# Patient Record
Sex: Male | Born: 1950 | Race: Black or African American | Hispanic: No | Smoking: Former smoker
Health system: Southern US, Community
[De-identification: ages and names within clinical notes are randomized; demographics above are authoritative.]

## PROBLEM LIST (undated history)

## (undated) DIAGNOSIS — I4891 Unspecified atrial fibrillation: Secondary | ICD-10-CM

## (undated) DIAGNOSIS — I428 Other cardiomyopathies: Secondary | ICD-10-CM

## (undated) DIAGNOSIS — R5382 Chronic fatigue, unspecified: Secondary | ICD-10-CM

## (undated) DIAGNOSIS — E785 Hyperlipidemia, unspecified: Secondary | ICD-10-CM

## (undated) DIAGNOSIS — Z9989 Dependence on other enabling machines and devices: Secondary | ICD-10-CM

## (undated) DIAGNOSIS — E669 Obesity, unspecified: Secondary | ICD-10-CM

## (undated) DIAGNOSIS — I10 Essential (primary) hypertension: Secondary | ICD-10-CM

## (undated) DIAGNOSIS — E039 Hypothyroidism, unspecified: Secondary | ICD-10-CM

## (undated) DIAGNOSIS — D649 Anemia, unspecified: Secondary | ICD-10-CM

## (undated) DIAGNOSIS — J45909 Unspecified asthma, uncomplicated: Secondary | ICD-10-CM

## (undated) DIAGNOSIS — M109 Gout, unspecified: Secondary | ICD-10-CM

## (undated) DIAGNOSIS — Z7901 Long term (current) use of anticoagulants: Secondary | ICD-10-CM

## (undated) DIAGNOSIS — M1990 Unspecified osteoarthritis, unspecified site: Secondary | ICD-10-CM

## (undated) DIAGNOSIS — G4733 Obstructive sleep apnea (adult) (pediatric): Secondary | ICD-10-CM

## (undated) DIAGNOSIS — Z720 Tobacco use: Secondary | ICD-10-CM

## (undated) DIAGNOSIS — I5022 Chronic systolic (congestive) heart failure: Secondary | ICD-10-CM

## (undated) DIAGNOSIS — Z95810 Presence of automatic (implantable) cardiac defibrillator: Secondary | ICD-10-CM

## (undated) DIAGNOSIS — J449 Chronic obstructive pulmonary disease, unspecified: Secondary | ICD-10-CM

## (undated) DIAGNOSIS — Z9289 Personal history of other medical treatment: Secondary | ICD-10-CM

## (undated) DIAGNOSIS — C3491 Malignant neoplasm of unspecified part of right bronchus or lung: Principal | ICD-10-CM

## (undated) HISTORY — DX: Unspecified atrial fibrillation: I48.91

## (undated) HISTORY — DX: Other cardiomyopathies: I42.8

## (undated) HISTORY — PX: TONSILLECTOMY: SUR1361

## (undated) HISTORY — DX: Hyperlipidemia, unspecified: E78.5

## (undated) HISTORY — PX: JOINT REPLACEMENT: SHX530

## (undated) HISTORY — DX: Chronic systolic (congestive) heart failure: I50.22

## (undated) HISTORY — DX: Essential (primary) hypertension: I10

## (undated) HISTORY — DX: Malignant neoplasm of unspecified part of right bronchus or lung: C34.91

## (undated) HISTORY — DX: Chronic fatigue, unspecified: R53.82

## (undated) HISTORY — DX: Obesity, unspecified: E66.9

---

## 1997-11-24 ENCOUNTER — Encounter: Payer: Self-pay | Admitting: Orthopedic Surgery

## 1997-11-25 ENCOUNTER — Encounter: Payer: Self-pay | Admitting: Orthopedic Surgery

## 1997-11-25 ENCOUNTER — Inpatient Hospital Stay (HOSPITAL_COMMUNITY): Admission: RE | Admit: 1997-11-25 | Discharge: 1997-11-30 | Payer: Self-pay | Admitting: Orthopedic Surgery

## 1997-11-25 HISTORY — PX: TOTAL HIP ARTHROPLASTY: SHX124

## 2000-09-13 ENCOUNTER — Encounter: Payer: Self-pay | Admitting: Emergency Medicine

## 2000-09-13 ENCOUNTER — Emergency Department (HOSPITAL_COMMUNITY): Admission: EM | Admit: 2000-09-13 | Discharge: 2000-09-13 | Payer: Self-pay | Admitting: Emergency Medicine

## 2004-08-25 ENCOUNTER — Ambulatory Visit: Payer: Self-pay | Admitting: Internal Medicine

## 2005-08-11 ENCOUNTER — Ambulatory Visit: Payer: Self-pay | Admitting: Internal Medicine

## 2006-06-04 ENCOUNTER — Ambulatory Visit: Payer: Self-pay | Admitting: Internal Medicine

## 2006-06-04 LAB — CONVERTED CEMR LAB
ALT: 36 units/L (ref 0–40)
AST: 30 units/L (ref 0–37)
BUN: 15 mg/dL (ref 6–23)
CO2: 30 meq/L (ref 19–32)
Calcium: 9.1 mg/dL (ref 8.4–10.5)
Chloride: 107 meq/L (ref 96–112)
Cholesterol: 118 mg/dL (ref 0–200)
Creatinine, Ser: 0.9 mg/dL (ref 0.4–1.5)
GFR calc Af Amer: 113 mL/min
GFR calc non Af Amer: 93 mL/min
Glucose, Bld: 101 mg/dL — ABNORMAL HIGH (ref 70–99)
HDL: 29.5 mg/dL — ABNORMAL LOW (ref >39.0)
LDL Cholesterol: 71 mg/dL (ref 0–99)
Potassium: 3.2 meq/L — ABNORMAL LOW (ref 3.5–5.1)
Sodium: 143 meq/L (ref 135–145)
TSH: 2.87 microintl units/mL (ref 0.35–5.50)
Total CHOL/HDL Ratio: 4
Triglycerides: 90 mg/dL (ref 0–149)
VLDL: 18 mg/dL (ref 0–40)

## 2006-06-11 ENCOUNTER — Ambulatory Visit: Payer: Self-pay | Admitting: Internal Medicine

## 2006-07-16 ENCOUNTER — Ambulatory Visit: Payer: Self-pay | Admitting: Internal Medicine

## 2006-07-16 LAB — CONVERTED CEMR LAB
BUN: 10 mg/dL (ref 6–23)
CO2: 29 meq/L (ref 19–32)
Calcium: 9.6 mg/dL (ref 8.4–10.5)
Chloride: 108 meq/L (ref 96–112)
Creatinine, Ser: 1 mg/dL (ref 0.4–1.5)
GFR calc Af Amer: 100 mL/min
GFR calc non Af Amer: 83 mL/min
Glucose, Bld: 92 mg/dL (ref 70–99)
Potassium: 4.1 meq/L (ref 3.5–5.1)
Sodium: 142 meq/L (ref 135–145)

## 2006-07-17 ENCOUNTER — Ambulatory Visit (HOSPITAL_COMMUNITY): Admission: RE | Admit: 2006-07-17 | Discharge: 2006-07-17 | Payer: Self-pay | Admitting: Internal Medicine

## 2006-08-30 ENCOUNTER — Ambulatory Visit: Payer: Self-pay | Admitting: Internal Medicine

## 2006-11-02 ENCOUNTER — Ambulatory Visit: Payer: Self-pay | Admitting: Internal Medicine

## 2007-01-31 ENCOUNTER — Ambulatory Visit: Payer: Self-pay | Admitting: Internal Medicine

## 2007-06-14 ENCOUNTER — Ambulatory Visit: Payer: Self-pay | Admitting: Internal Medicine

## 2007-09-11 ENCOUNTER — Ambulatory Visit: Payer: Self-pay | Admitting: Internal Medicine

## 2007-09-11 LAB — CONVERTED CEMR LAB
AST: 22 units/L (ref 0–37)
BUN: 19 mg/dL (ref 6–23)
CO2: 28 meq/L (ref 19–32)
Calcium: 9.1 mg/dL (ref 8.4–10.5)
Chloride: 103 meq/L (ref 96–112)
Cholesterol: 139 mg/dL (ref 0–200)
Creatinine, Ser: 1 mg/dL (ref 0.4–1.5)
GFR calc Af Amer: 100 mL/min
GFR calc non Af Amer: 82 mL/min
Glucose, Bld: 104 mg/dL — ABNORMAL HIGH (ref 70–99)
HDL: 28.4 mg/dL — ABNORMAL LOW (ref >39.0)
LDL Cholesterol: 91 mg/dL (ref 0–99)
Potassium: 3.3 meq/L — ABNORMAL LOW (ref 3.5–5.1)
Sodium: 140 meq/L (ref 135–145)
Total CHOL/HDL Ratio: 4.9
Triglycerides: 98 mg/dL (ref 0–149)
VLDL: 20 mg/dL (ref 0–40)

## 2008-01-31 ENCOUNTER — Ambulatory Visit: Payer: Self-pay | Admitting: Internal Medicine

## 2008-01-31 LAB — CONVERTED CEMR LAB
BUN: 18 mg/dL (ref 6–23)
CO2: 30 meq/L (ref 19–32)
Chloride: 107 meq/L (ref 96–112)
Creatinine, Ser: 1.2 mg/dL (ref 0.4–1.5)
Glucose, Bld: 109 mg/dL — ABNORMAL HIGH (ref 70–99)

## 2008-02-18 ENCOUNTER — Ambulatory Visit: Payer: Self-pay | Admitting: Internal Medicine

## 2008-02-18 LAB — CONVERTED CEMR LAB
BUN: 13 mg/dL (ref 6–23)
CO2: 29 meq/L (ref 19–32)
Chloride: 107 meq/L (ref 96–112)
Glucose, Bld: 105 mg/dL — ABNORMAL HIGH (ref 70–99)
Potassium: 3.4 meq/L — ABNORMAL LOW (ref 3.5–5.1)
Sodium: 142 meq/L (ref 135–145)

## 2008-03-02 ENCOUNTER — Ambulatory Visit: Payer: Self-pay | Admitting: Internal Medicine

## 2008-03-02 LAB — CONVERTED CEMR LAB
BUN: 20 mg/dL (ref 6–23)
Chloride: 107 meq/L (ref 96–112)
Glucose, Bld: 102 mg/dL — ABNORMAL HIGH (ref 70–99)
Potassium: 3.7 meq/L (ref 3.5–5.1)
Sodium: 142 meq/L (ref 135–145)

## 2008-03-19 ENCOUNTER — Ambulatory Visit: Payer: Self-pay | Admitting: Internal Medicine

## 2008-03-19 LAB — CONVERTED CEMR LAB
BUN: 20 mg/dL (ref 6–23)
CO2: 28 meq/L (ref 19–32)
Chloride: 108 meq/L (ref 96–112)
Creatinine, Ser: 1.3 mg/dL (ref 0.4–1.5)
GFR calc non Af Amer: 61 mL/min
Potassium: 4 meq/L (ref 3.5–5.1)

## 2009-01-29 ENCOUNTER — Encounter: Payer: Self-pay | Admitting: Internal Medicine

## 2009-04-03 HISTORY — PX: CARDIOVERSION: SHX1299

## 2009-07-06 ENCOUNTER — Telehealth: Payer: Self-pay | Admitting: Internal Medicine

## 2009-07-15 ENCOUNTER — Ambulatory Visit: Payer: Self-pay | Admitting: Internal Medicine

## 2009-07-15 DIAGNOSIS — E7800 Pure hypercholesterolemia, unspecified: Secondary | ICD-10-CM | POA: Insufficient documentation

## 2009-07-15 DIAGNOSIS — E785 Hyperlipidemia, unspecified: Secondary | ICD-10-CM

## 2009-07-15 DIAGNOSIS — E663 Overweight: Secondary | ICD-10-CM | POA: Insufficient documentation

## 2009-07-15 DIAGNOSIS — I10 Essential (primary) hypertension: Secondary | ICD-10-CM | POA: Insufficient documentation

## 2009-07-15 DIAGNOSIS — F17200 Nicotine dependence, unspecified, uncomplicated: Secondary | ICD-10-CM | POA: Insufficient documentation

## 2009-07-26 ENCOUNTER — Ambulatory Visit: Payer: Self-pay | Admitting: Internal Medicine

## 2009-07-26 DIAGNOSIS — E876 Hypokalemia: Secondary | ICD-10-CM | POA: Insufficient documentation

## 2009-07-27 ENCOUNTER — Encounter: Payer: Self-pay | Admitting: Internal Medicine

## 2009-07-27 LAB — CONVERTED CEMR LAB
BUN: 16 mg/dL (ref 6–23)
Calcium: 9 mg/dL (ref 8.4–10.5)
Chloride: 104 meq/L (ref 96–112)
Creatinine, Ser: 1.3 mg/dL (ref 0.4–1.5)
GFR calc non Af Amer: 72.98 mL/min (ref >60)

## 2009-07-29 ENCOUNTER — Ambulatory Visit: Payer: Self-pay | Admitting: Internal Medicine

## 2009-07-30 ENCOUNTER — Encounter: Payer: Self-pay | Admitting: Internal Medicine

## 2009-07-30 LAB — CONVERTED CEMR LAB
Calcium: 9.5 mg/dL (ref 8.4–10.5)
GFR calc non Af Amer: 88.49 mL/min (ref >60)
Glucose, Bld: 99 mg/dL (ref 70–99)
Potassium: 4.5 meq/L (ref 3.5–5.1)
Sodium: 143 meq/L (ref 135–145)

## 2009-08-04 ENCOUNTER — Ambulatory Visit: Payer: Self-pay | Admitting: Internal Medicine

## 2009-08-04 DIAGNOSIS — E875 Hyperkalemia: Secondary | ICD-10-CM

## 2009-08-04 LAB — CONVERTED CEMR LAB
Aldosterone, Serum: 8
BUN: 14 mg/dL (ref 6–23)
Chloride: 103 meq/L (ref 96–112)
GFR calc non Af Amer: 88.49 mL/min (ref >60)
Potassium: 3.2 meq/L — ABNORMAL LOW (ref 3.5–5.1)
Sodium: 143 meq/L (ref 135–145)

## 2009-08-17 ENCOUNTER — Ambulatory Visit: Payer: Self-pay | Admitting: Internal Medicine

## 2009-08-20 LAB — CONVERTED CEMR LAB
CO2: 33 meq/L — ABNORMAL HIGH (ref 19–32)
Calcium: 9.3 mg/dL (ref 8.4–10.5)
Chloride: 103 meq/L (ref 96–112)
Glucose, Bld: 118 mg/dL — ABNORMAL HIGH (ref 70–99)
Potassium: 3.9 meq/L (ref 3.5–5.1)
Sodium: 142 meq/L (ref 135–145)

## 2009-09-22 ENCOUNTER — Encounter: Payer: Self-pay | Admitting: Internal Medicine

## 2009-09-24 ENCOUNTER — Ambulatory Visit: Payer: Self-pay | Admitting: Internal Medicine

## 2009-09-24 DIAGNOSIS — I4891 Unspecified atrial fibrillation: Secondary | ICD-10-CM | POA: Insufficient documentation

## 2009-09-27 ENCOUNTER — Ambulatory Visit: Payer: Self-pay | Admitting: Internal Medicine

## 2009-09-27 ENCOUNTER — Encounter (INDEPENDENT_AMBULATORY_CARE_PROVIDER_SITE_OTHER): Payer: Self-pay | Admitting: *Deleted

## 2009-09-27 ENCOUNTER — Ambulatory Visit: Payer: Self-pay | Admitting: Cardiology

## 2009-09-27 DIAGNOSIS — G4730 Sleep apnea, unspecified: Secondary | ICD-10-CM | POA: Insufficient documentation

## 2009-09-29 ENCOUNTER — Encounter: Payer: Self-pay | Admitting: Cardiology

## 2009-09-29 ENCOUNTER — Ambulatory Visit: Payer: Self-pay | Admitting: Cardiology

## 2009-09-29 ENCOUNTER — Ambulatory Visit (HOSPITAL_COMMUNITY): Admission: RE | Admit: 2009-09-29 | Discharge: 2009-09-29 | Payer: Self-pay | Admitting: Internal Medicine

## 2009-10-05 ENCOUNTER — Ambulatory Visit: Payer: Self-pay | Admitting: Internal Medicine

## 2009-10-07 ENCOUNTER — Encounter: Payer: Self-pay | Admitting: Internal Medicine

## 2009-10-07 ENCOUNTER — Ambulatory Visit: Payer: Self-pay | Admitting: Internal Medicine

## 2009-10-14 ENCOUNTER — Encounter: Payer: Self-pay | Admitting: Internal Medicine

## 2009-10-14 ENCOUNTER — Ambulatory Visit: Payer: Self-pay | Admitting: Internal Medicine

## 2009-10-21 ENCOUNTER — Telehealth: Payer: Self-pay | Admitting: Internal Medicine

## 2009-10-25 ENCOUNTER — Ambulatory Visit: Payer: Self-pay | Admitting: Cardiology

## 2009-11-08 ENCOUNTER — Ambulatory Visit: Payer: Self-pay | Admitting: Cardiovascular Disease

## 2009-11-08 LAB — CONVERTED CEMR LAB: POC INR: 2.7

## 2009-11-22 ENCOUNTER — Ambulatory Visit (HOSPITAL_BASED_OUTPATIENT_CLINIC_OR_DEPARTMENT_OTHER): Admission: RE | Admit: 2009-11-22 | Discharge: 2009-11-22 | Payer: Self-pay | Admitting: Internal Medicine

## 2009-11-22 ENCOUNTER — Encounter: Payer: Self-pay | Admitting: Pulmonary Disease

## 2009-11-22 ENCOUNTER — Encounter: Payer: Self-pay | Admitting: Internal Medicine

## 2009-12-01 ENCOUNTER — Ambulatory Visit: Payer: Self-pay | Admitting: Pulmonary Disease

## 2009-12-03 ENCOUNTER — Ambulatory Visit: Payer: Self-pay | Admitting: Internal Medicine

## 2009-12-07 ENCOUNTER — Ambulatory Visit: Payer: Self-pay | Admitting: Cardiovascular Disease

## 2009-12-07 LAB — CONVERTED CEMR LAB: POC INR: 2.3

## 2009-12-20 ENCOUNTER — Ambulatory Visit: Payer: Self-pay | Admitting: Pulmonary Disease

## 2009-12-20 DIAGNOSIS — G4733 Obstructive sleep apnea (adult) (pediatric): Secondary | ICD-10-CM | POA: Insufficient documentation

## 2010-01-03 ENCOUNTER — Ambulatory Visit: Payer: Self-pay | Admitting: Internal Medicine

## 2010-01-03 LAB — CONVERTED CEMR LAB: POC INR: 1.2

## 2010-01-13 ENCOUNTER — Ambulatory Visit: Payer: Self-pay | Admitting: Internal Medicine

## 2010-01-13 LAB — CONVERTED CEMR LAB: POC INR: 2

## 2010-01-27 ENCOUNTER — Ambulatory Visit: Payer: Self-pay | Admitting: Internal Medicine

## 2010-01-27 LAB — CONVERTED CEMR LAB: POC INR: 2.4

## 2010-02-17 ENCOUNTER — Ambulatory Visit: Payer: Self-pay | Admitting: Cardiovascular Disease

## 2010-03-02 ENCOUNTER — Encounter (INDEPENDENT_AMBULATORY_CARE_PROVIDER_SITE_OTHER): Payer: Self-pay | Admitting: *Deleted

## 2010-03-10 ENCOUNTER — Encounter (INDEPENDENT_AMBULATORY_CARE_PROVIDER_SITE_OTHER): Payer: Self-pay | Admitting: *Deleted

## 2010-03-17 ENCOUNTER — Ambulatory Visit: Payer: Self-pay | Admitting: Internal Medicine

## 2010-04-01 ENCOUNTER — Ambulatory Visit: Payer: Self-pay | Admitting: Internal Medicine

## 2010-04-01 ENCOUNTER — Encounter: Payer: Self-pay | Admitting: Internal Medicine

## 2010-04-11 ENCOUNTER — Telehealth: Payer: Self-pay | Admitting: Internal Medicine

## 2010-04-11 LAB — CONVERTED CEMR LAB
BUN: 17 mg/dL (ref 6–23)
Basophils Absolute: 0 10*3/uL (ref 0.0–0.1)
Basophils Relative: 0 % (ref 0–1)
Chloride: 105 meq/L (ref 96–112)
Cholesterol: 140 mg/dL (ref 0–200)
Eosinophils Absolute: 0.2 10*3/uL (ref 0.0–0.7)
HDL: 31 mg/dL — ABNORMAL LOW (ref >39)
LDL Cholesterol: 91 mg/dL (ref 0–99)
MCHC: 33.3 g/dL (ref 30.0–36.0)
MCV: 92.3 fL (ref 78.0–100.0)
Neutro Abs: 2.7 10*3/uL (ref 1.7–7.7)
Neutrophils Relative %: 53 % (ref 43–77)
Platelets: 180 10*3/uL (ref 150–400)
Potassium: 4.3 meq/L (ref 3.5–5.3)
RBC: 4.68 M/uL (ref 4.22–5.81)
RDW: 15 % (ref 11.5–15.5)
Sodium: 141 meq/L (ref 135–145)
Total CHOL/HDL Ratio: 4.5
Triglycerides: 89 mg/dL (ref <150)
VLDL: 18 mg/dL (ref 0–40)

## 2010-04-14 ENCOUNTER — Ambulatory Visit: Admission: RE | Admit: 2010-04-14 | Discharge: 2010-04-14 | Payer: Self-pay | Source: Home / Self Care

## 2010-04-14 LAB — CONVERTED CEMR LAB: POC INR: 2.4

## 2010-04-24 ENCOUNTER — Encounter: Payer: Self-pay | Admitting: Internal Medicine

## 2010-05-01 LAB — CONVERTED CEMR LAB
BUN: 13 mg/dL (ref 6–23)
Basophils Relative: 0 % (ref 0–1)
CO2: 24 meq/L (ref 19–32)
CO2: 27 meq/L (ref 19–32)
Calcium: 9.3 mg/dL (ref 8.4–10.5)
Calcium: 9.6 mg/dL (ref 8.4–10.5)
Chloride: 104 meq/L (ref 96–112)
Cholesterol: 112 mg/dL (ref 0–200)
Cholesterol: 142 mg/dL (ref 0–200)
Eosinophils Relative: 1 % (ref 0–5)
Glucose, Bld: 113 mg/dL — ABNORMAL HIGH (ref 70–99)
Glucose, Bld: 83 mg/dL (ref 70–99)
HCT: 44.1 % (ref 39.0–52.0)
Hemoglobin: 15 g/dL (ref 13.0–17.0)
LDL Cholesterol: 61 mg/dL (ref 0–99)
LDL Cholesterol: 93 mg/dL (ref 0–99)
MCHC: 34 g/dL (ref 30.0–36.0)
Monocytes Absolute: 0.7 10*3/uL (ref 0.1–1.0)
Monocytes Relative: 11 % (ref 3–12)
Neutro Abs: 3.9 10*3/uL (ref 1.7–7.7)
Potassium: 3.9 meq/L (ref 3.5–5.3)
RBC: 5.03 M/uL (ref 4.22–5.81)
RDW: 14.6 % (ref 11.5–15.5)
Sodium: 140 meq/L (ref 135–145)
Sodium: 140 meq/L (ref 135–145)
Total CHOL/HDL Ratio: 4
Total CHOL/HDL Ratio: 4
Triglycerides: 103 mg/dL (ref 0.0–149.0)
Triglycerides: 81 mg/dL (ref 0.0–149.0)
VLDL: 16.2 mg/dL (ref 0.0–40.0)
VLDL: 20.6 mg/dL (ref 0.0–40.0)

## 2010-05-03 NOTE — Assessment & Plan Note (Signed)
Summary: consult for osa   Visit Type:  Initial Consult Copy to:  Dr. Tenny Craw Primary Provider/Referring Provider:  Dr Ivory Broad  CC:  Sleep Consult. [name]  History of Present Illness: The pt is a 60y/o male who I have been asked to see for management of osa.  He has had a recent sleep study that was a split night protocol, and revealed moderate osa with AHI of 33/hr.  He was then placed on cpap, and titrated to optimal pressure of 17cm.  The pt has been noted to have loud snoring and an abnormal breathing pattern during sleep.  He works third shift from 8pm to as late as 6:30am.  He typically sleeps from 7am to 2 pm, and doesn't feel rested upon arising.  He describes frequent awakenings and choking arousals from sleep.  He notes significant sleep pressure with periods of inactivity, although does not get sleepy driving.  His epworth score today is very high at 17.  His weight is down 10 pounds.  Current Medications (verified): 1)  Clonidine Hcl 0.3 Mg Tabs (Clonidine Hcl) .[name].. 1tab Two Times A Day 2)  Hydralazine Hcl 25 Mg Tabs (Hydralazine Hcl) .... Take One Tab Three Times Daily 3)  Lisinopril 40 Mg Tabs (Lisinopril) .... One Tab Once Daily 4)  Simvastatin 40 Mg Tabs (Simvastatin) .[name].. 1 Tablet By Mouth At Bedtime 5)  Allopurinol 300 Mg Tabs (Allopurinol) .[name].. 1 Tab Two Times A Day \\par  6)  Niaspan 1000 Mg Cr-Tabs (Niacin (Antihyperlipidemic)) .[name].. 1 Tab At Bedtime 7)  Klor-Con 20 Meq Pack (Potassium Chloride) .[name].. 1 Tab Bid 8)  Furosemide 40 Mg Tabs (Furosemide) .... Take One Tablet Two Times A Day 9)  Warfarin Sodium 5 Mg Tabs (Warfarin Sodium) .... Use As Directed By Anticoagulation Clinic 10)  Metoprolol Tartrate 50 Mg Tabs (Metoprolol Tartrate) .... 3 Tabs 2 Times Per Day  Allergies (verified): No Known Drug Allergies  Past History:  Past Medical History: Hypertension Dyslipidemia Obesity Atrial fibrillation. OSA--AHI 33/hr 11/2009  Past Surgical History: Reviewed history from  02/02/2008 and no changes required. Hip replacement 1999  Family History: Reviewed history from 02/02/2008 and no changes required. Dad - deceased (unknown cause) Mom - Hypertension  Social History: Reviewed history from 02/02/2008 and no changes required. Married lives with wife Children--1 Tobacco use ( 1/2ppd). Started 1969.  Review of Systems       The patient complains of irregular heartbeats.  The patient denies shortness of breath with activity, shortness of breath at rest, productive cough, non-productive cough, coughing up blood, chest pain, acid heartburn, indigestion, loss of appetite, weight change, abdominal pain, difficulty swallowing, sore throat, tooth/dental problems, headaches, nasal congestion/difficulty breathing through nose, sneezing, itching, ear ache, anxiety, depression, hand/feet swelling, joint stiffness or pain, rash, change in color of mucus, and fever.    Vital Signs:  Patient profile:   60 year old male Height:      73 inches Weight:      296.38 pounds BMI:     39.24 O2 Sat:      97 % on Room air Temp:     98.2 degrees F oral Pulse rate:   75 / minute BP sitting:   152 / 102  (right arm) Cuff size:   large  Vitals Entered By: Carver Fila (December 20, 2009 10:49 AM)  O2 Flow:  Room air CC: Sleep Consult.  Comments meds and allergies updated Phone number updated Carver Fila  December 20, 2009 10:52 AM  Physical Exam  General:  obese male in nad Eyes:  PERRLA and EOMI.   Nose:  large turbinates with narrowing bilat. Mouth:  elongation of soft palate and normal uvula. small posterior pharyngeal space Neck:  large, no jvd, tmg, LN Lungs:  clear to auscultation Heart:  rrr, 2/6 sem. Abdomen:  soft and nontender,bs+ Extremities:  1+ edema bilat, pulses intact distally no cyanosis noted. Neurologic:  alert and oriented, moves all 4.   Impression & Recommendations:  Problem # 1:  OBSTRUCTIVE SLEEP APNEA (ICD-327.23) the pt has  moderate osa by his recent sleep study, with an excellent response to cpap during the therapeutic portion of the study.  I have had a long discussion with the pt about sleep apnea, including its impact on QOL and CV health.  Although there are other potential treatment options available to the pt, I have recommended a trial of cpap while working on weight loss.  He has comorbid medical issues that can be greatly affected by sleep disordered breathing, and is clearly very symptomatic during his waking hours. (although his sleep schedule may be contributing to this as well).  The pt is agreeable to trying cpap, and will set up on moderate pressure initially to allow for desensitization.  Other Orders: Consultation Level IV (47829) DME Referral (DME)  Patient Instructions: 1)  will start on cpap, please call if having tolerance issues 2)  work on weight loss 3)  followup with me in 4-5 weeks.

## 2010-05-03 NOTE — Assessment & Plan Note (Signed)
Visit Type:  Follow-up Primary Provider:  Dr Ivory Broad  CC:  no complaints.  History of Present Illness: Patient is a 60 year old with a history of dyslipidemia, hypertension, obesity and tobacco use.  I saw him in Oct 2009.  Since seen he has done well.  No chest pain.  No SOB.  Still smoking.  Had a rxn to [facility].  Current Medications (verified): 1)  Amlodipine Besylate 10 Mg Tabs (Amlodipine Besylate) .[name].. 1 Tab Once Daily 2)  Clonidine Hcl 0.3 Mg Tabs (Clonidine Hcl) .[name].. 1tab Two Times A Day 3)  Hydralazine Hcl 25 Mg Tabs (Hydralazine Hcl) .... Take One Tab Three Times Daily 4)  Lisinopril 40 Mg Tabs (Lisinopril) .... One Tab Once Daily 5)  Metoprolol Tartrate 50 Mg Tabs (Metoprolol Tartrate) .... 3 Tab Two Times A Day 6)  Niaspan 500 Mg Cr-Tabs (Niacin (Antihyperlipidemic)) .... Take 1 Tablet By Mouth At Bedtime 7)  Simvastatin 40 Mg Tabs (Simvastatin) .[name].. 1 Tablet By Mouth At Bedtime 8)  Triamterene-Hctz 37.5-25 Mg Caps (Triamterene-Hctz) .[name].. 1 Capsule By Mouth Every Morning 9)  Allopurinol 300 Mg Tabs (Allopurinol) .[name].. 1 Tab Two Times A Day \\par   Allergies (verified): No Known Drug Allergies  Past History:  Past medical, surgical, family and social histories (including risk factors) reviewed, and no changes noted (except as noted below).  Past Medical History: Reviewed history from 02/02/2008 and no changes required. Hypertension Dyslipidemia Obesity  Past Surgical History: Reviewed history from 02/02/2008 and no changes required. Hip replacement 1999  Family History: Reviewed history from 02/02/2008 and no changes required. Dad - deceased (unknown cause) Mom - Hypertension 2 brothers  Social History: Reviewed history from 02/02/2008 and no changes required. Married Tobacco use (1ppd to 1/2ppd)  Review of Systems       All systems reviewed.  Neg to above problem.   Vital Signs:  Patient profile:   60 year old male Height:      73 inches Weight:       305 pounds BMI:     40.39 Pulse rate:   65 / minute BP sitting:   126 / 73  (left arm) Cuff size:   large  Vitals Entered By: Burnett Kanaris, CNA (July 15, 2009 9:27 AM)  Physical Exam  Additional Exam:  Patient is in NAD HEENT:  Normocephalic, atraumatic. EOMI, PERRLA.  Neck: JVP is normal. No thyromegaly. No bruits.  Lungs: clear to auscultation. No rales no wheezes.  Chest: Gynecomastia Heart: Regular rate and rhythm. Normal S1, S2. No S3.   No significant murmurs. PMI not displaced.  Abdomen:  Supple, nontender. Normal bowel sounds. No masses. No hepatomegaly.  Extremities:   Good distal pulses throughout. No lower extremity edema.  Musculoskeletal :moving all extremities.  Neuro:   alert and oriented x3.    EKG  Procedure date:  07/15/2009  Findings:      NSR>  63 bpm  Impression & Recommendations:  Problem # 1:  HYPERLIPIDEMIA-MIXED (ICD-272.4) Will check lipid and AST today. The following medications were removed from the medication list:    Lescol 20 Mg Caps (Fluvastatin sodium)    Niaspan 500 Mg Cr-tabs (Niacin (antihyperlipidemic)) .[name]... Take 1 tablet by mouth at bedtime    Vytorin 10-10 Mg Tabs (Ezetimibe-simvastatin) His updated medication list for this problem includes:    Simvastatin 40 Mg Tabs (Simvastatin) .[name]... 1 tablet by mouth at bedtime    Niaspan 1000 Mg Cr-tabs (Niacin (antihyperlipidemic)) .[name]... 1 tab at bedtime  Problem #  2:  HYPERTENSION, BENIGN (ICD-401.1) Good control.  Check BMET today.  Problem # 3:  TOBACCO ABUSE (ICD-305.1) Counselled on quitting.  Problem # 4:  OVERWEIGHT/OBESITY (ICD-278.02) Counselled.  Other Orders: EKG w/ Interpretation (93000) TLB-BMP (Basic Metabolic Panel-BMET) (80048-METABOL) TLB-AST (SGOT) (84450-SGOT) TLB-Lipid Panel (80061-LIPID) Prescriptions: POTASSIUM CHLORIDE CRYS CR 20 MEQ CR-TABS (POTASSIUM CHLORIDE CRYS CR) Take one tablet by mouth daily  #30 x 6   Entered by:   Layne Benton, RN, BSN    Authorized by:   Sherrill Raring, MD, [facility]   Signed by:   Layne Benton, RN, BSN on 07/16/2009   Method used:   Electronically to        [facility] [phone]* (retail)       [address]       [place], [place]  [address]       Ph: [phone]       Fax: [phone]   RxID:   [id number] NIASPAN 1000 MG CR-TABS (NIACIN (ANTIHYPERLIPIDEMIC)) 1 tab at bedtime  #30 x 6   Entered by:   Layne Benton, RN, BSN   Authorized by:   Sherrill Raring, MD, [facility]   Signed by:   Layne Benton, RN, BSN on 07/16/2009   Method used:   Electronically to        [facility] [phone]* (retail)       [address]       [place], [place]  [address]       Ph: [phone]       Fax: [phone]   RxID:   [id number]

## 2010-05-03 NOTE — Progress Notes (Signed)
Summary: refill /lisinopril and simvastation done daj  Phone Note Refill Request Message from:  Patient on July 06, 2009 2:06 PM  Refills Requested: Medication #1:  LISINOPRIL 40 MG TABS one tab once daily  Medication #2:  SIMVASTATIN 40 MG TABS 1 tablet by mouth at bedtime [facility] to [facility] [phone] need 90 day supply do not send to [facility].  Initial call taken by: Judie Grieve,  July 06, 2009 2:08 PM  Follow-up for Phone Call        Pt needs these two called into [facility] -he uses different pharmacies for different medicationsDAJ Follow-up by: Burnett Kanaris, CNA,  July 06, 2009 2:44 PM  Additional Follow-up for Phone Call Additional follow up Details #1::        Spoke with [facility] [address] [facility] is closed. Pt will use [address] [facility]. Refills sent -pt has appt coming up 04.14.2011 Additional Follow-up by: Burnett Kanaris, CNA,  July 06, 2009 2:46 PM    Prescriptions: SIMVASTATIN 40 MG TABS (SIMVASTATIN) 1 tablet by mouth at bedtime  #90 x 1   Entered by:   Burnett Kanaris, CNA   Authorized by:   Sherrill Raring, MD, [facility]   Signed by:   Burnett Kanaris, CNA on 07/06/2009   Method used:   [facility]        [facility] [phone]* (retail)       [address] [place], [place]  [address]       Ph: [phone]       Fax: [phone]   RxID:   [id number] LISINOPRIL 40 MG TABS (LISINOPRIL) one tab once daily  #90 x 1   Entered by:   Burnett Kanaris, CNA   Authorized by:   Sherrill Raring, MD, [facility]   Signed by:   Burnett Kanaris, CNA on 07/06/2009   Method used:   [facility] [phone]* (retail)       [address] [place], [place]  [address]       Ph: [phone]       Fax: [phone]   RxID:   [id number]

## 2010-05-03 NOTE — Letter (Signed)
Summary: Cardioversion/TEE Instructions  Architectural technologist, Main Office  [address]   [place], [place] [address]   Phone: [phone]  Fax: [phone]    Cardioversion / TEE Cardioversion Instructions  You are scheduled for a Cardioversion / TEE Cardioversion on ___6/29/2011_________________________ with Dr. _________________________________________.   Please arrive at the [facility] at ____9_________ a.m. / . on the day of your procedure.  1)   DIET:  A)   Nothing to eat or drink after midnight except your medications with a sip of water.            3)   MAKE SURE YOU TAKE YOUR COUMADIN.  4)   A)   DO NOT TAKE these medications before your procedure:      ___________________________________________________________________     ___________________________________________________________________     ___________________________________________________________________  B)   YOU MAY TAKE ALL of your remaining medications with a small amount of water.    C)   START NEW medications:       ___________________________________________________________________     ___________________________________________________________________  5)  Must have a responsible person to drive you home.  6)   Bring a current list of your medications and current insurance cards.   * Special Note:  Every effort is made to have your procedure done on time. Occasionally there are emergencies that present themselves at the hospital that may cause delays. Please be patient if a delay does occur.  * If you have any questions after you get home, please call the office at [phone].

## 2010-05-03 NOTE — Medication Information (Signed)
Summary: rov/jk  Anticoagulant Therapy  Managed by: Cloyde Reams, RN, BSN Referring MD: Tenny Craw PCP: Dr Larna Daughters MD: Eden Emms MD, Theron Arista Indication 1: Atrial Fibrillation Lab Used: [facility] [place] Site: [address] INR POC 2.3 INR RANGE 2.0-3.0  Dietary changes: no    Health status changes: no    Bleeding/hemorrhagic complications: no    Recent/future hospitalizations: no    Any changes in medication regimen? no    Recent/future dental: no  Any missed doses?: no       Is patient compliant with meds? yes       Allergies: No Known Drug Allergies  Anticoagulation Management History:      The patient is taking warfarin and comes in today for a routine follow up visit.  Negative risk factors for bleeding include an age less than 53 years old.  The bleeding index is 'low risk'.  Positive CHADS2 values include History of HTN.  Negative CHADS2 values include Age > 81 years old.  Anticoagulation responsible provider: Eden Emms MD, Theron Arista.  INR POC: 2.3.  Cuvette Lot#: [id number].  Exp: 01/2011.    Anticoagulation Management Assessment/Plan:      The patient's current anticoagulation dose is Warfarin sodium 5 mg tabs: Use as directed by Anticoagulation Clinic.  The target INR is 2.0-3.0.  The next INR is due 01/03/2010.  Anticoagulation instructions were given to patient.  Results were reviewed/authorized by Cloyde Reams, RN, BSN.  He was notified by Cloyde Reams RN.         Prior Anticoagulation Instructions: INR- 2.7  Continue taking 1.5 tablets (7.5mg ) every day except take 1 tablet (5mg ) on Tues, Thurs, and Sat.    Current Anticoagulation Instructions: INR 2.3    Continue on same dosage 1.5 tablets daily except 1 tablet on Tuesdays, Thursdays, and Saturdays.  Recheck in 4 weeks.

## 2010-05-03 NOTE — Miscellaneous (Signed)
Clinical Lists Changes  Problems: Added new problem of SLEEP APNEA (ICD-780.57) Orders: Added new Service order of EKG w/ Interpretation (93000) - Signed Added new Referral order of Trans Esophageal Echo Cardioversion (TEE-Cardioversion) - Signed Added new Referral order of Sleep Disorder Referral (Sleep Disorder) - Signed Added new Test order of TLB-Lipid Panel (80061-LIPID) - Signed Added new Test order of TLB-AST (SGOT) (84450-SGOT) - Signed Observations: Added new observation of PI CARDIO: Your physician recommends that you return for lab work in: lab work today....we will call you with results. Your physician has requested that you have a TEE/Cardioversion.  During a TEE, sound waves are used to create images of your heart. It provides your doctor with information about the size and shape of your heart and how well your heart's chambers and valves are working. In this test, a transducer is attached to the end of a flexible tube that is guided down your throat and into your esophagus (the tube leading from your mouth to your stomach) to get a more detailed image of your heart. Once the TEE has determined that a blood clot is not present, the cardioversion begins.  Electrical cardioversion uses a jolt of electricity to your heart either through paddles or wired patches attached to your chest. This is a controlled, usually prescheduled, procedure. This procedure is done at the hospital and you are not awake during the procedure.  You usually go home the day of the procedure. Please see the instruction sheet given to you today for more information. Your physician has recommended that you have a sleep study.  This test records several body functions during sleep, including:  brain activity, eye movement, oxygen and carbon dioxide blood levels, heart rate and rhythm, breathing rate and rhythm, the flow of air through your mouth and nose, snoring, body muscle movements, and chest and belly  movement. Your physician recommends that you schedule a follow-up appointment in: return to clinic to see Dr.Debbe Crumble on 10/07/2009 Your physician has recommended you make the following change in your medication: increase Metoprolol to 150 mg 2 times per day. (09/27/2009 10:16)      Patient Instructions: 1)  Your physician recommends that you return for lab work in: lab work today....we will call you with results. 2)  Your physician has requested that you have a TEE/Cardioversion.  During a TEE, sound waves are used to create images of your heart. It provides your doctor with information about the size and shape of your heart and how well your heart's chambers and valves are working. In this test, a transducer is attached to the end of a flexible tube that is guided down your throat and into your esophagus (the tube leading from your mouth to your stomach) to get a more detailed image of your heart. Once the TEE has determined that a blood clot is not present, the cardioversion begins.  Electrical cardioversion uses a jolt of electricity to your heart either through paddles or wired patches attached to your chest. This is a controlled, usually prescheduled, procedure. This procedure is done at the hospital and you are not awake during the procedure.  You usually go home the day of the procedure. Please see the instruction sheet given to you today for more information. 3)  Your physician has recommended that you have a sleep study.  This test records several body functions during sleep, including:  brain activity, eye movement, oxygen and carbon dioxide blood levels, heart rate and rhythm, breathing rate and rhythm,  the flow of air through your mouth and nose, snoring, body muscle movements, and chest and belly movement. 4)  Your physician recommends that you schedule a follow-up appointment in: return to clinic to see Dr.Elysha Daw on 10/07/2009 5)  Your physician has recommended you make the following change in your  medication: increase Metoprolol to 150 mg 2 times per day.

## 2010-05-03 NOTE — Progress Notes (Signed)
  Phone Note Call from Patient   Caller: lilly w/[employer] Reason for Call: Talk to Nurse Summary of Call: lilly with [employer] calling to let you know pt not responding to any of their calls re the dr Perrin Gens's orderspls call [phone] Initial call taken by: Glynda Jaeger,  October 21, 2009 3:18 PM  Follow-up for Phone Call        [facility] that Marcos Eke advised me that his insurance would not cover a home sleep study so we have to do the test in the hospital. Follow-up by: Suzan Garibaldi RN

## 2010-05-03 NOTE — Medication Information (Signed)
Summary: rov/cs  Anticoagulant Therapy  Managed by: Cloyde Reams, RN, BSN Referring MD: Tenny Craw PCP: Dr Larna Daughters MD: Gala Romney MD, Reuel Boom Indication 1: Atrial Fibrillation Lab Used: [facility] [place] Site: [address] INR POC 2.4 INR RANGE 2.0-3.0  Dietary changes: no    Health status changes: no    Bleeding/hemorrhagic complications: no    Recent/future hospitalizations: no    Any changes in medication regimen? no    Recent/future dental: no  Any missed doses?: no       Is patient compliant with meds? yes       Allergies: No Known Drug Allergies  Anticoagulation Management History:      The patient is taking warfarin and comes in today for a routine follow up visit.  Negative risk factors for bleeding include an age less than 91 years old.  The bleeding index is 'low risk'.  Positive CHADS2 values include History of HTN.  Negative CHADS2 values include Age > 85 years old.  Anticoagulation responsible provider: Bensimhon MD, Reuel Boom.  INR POC: 2.4.  Cuvette Lot#: [id number].  Exp: 03/2011.    Anticoagulation Management Assessment/Plan:      The patient's current anticoagulation dose is Warfarin sodium 5 mg tabs: Use as directed by Anticoagulation Clinic.  The target INR is 2.0-3.0.  The next INR is due 02/17/2010.  Anticoagulation instructions were given to patient.  Results were reviewed/authorized by Cloyde Reams, RN, BSN.  He was notified by Cloyde Reams RN.         Prior Anticoagulation Instructions: INR 2.0  Continue Coumadin as scheduled:  1 and 1/2 tablets every day of the week, except 1 tablet on Tuesday, Thursday, and Saturday.    Return to clinic in 2 weeks.   Current Anticoagulation Instructions: INR 2.4  Continue on same dosage 1.5 tablets daily except 1 tablet on Tuesdays, Thursdays, and Saturdays.  Recheck in 3 weeks.

## 2010-05-03 NOTE — Medication Information (Signed)
Summary: rov/sp  Anticoagulant Therapy  Managed by: Weston Brass, PharmD Referring MD: Tenny Craw PCP: Dr Larna Daughters MD: Tenny Craw MD, Gunnar Fusi Indication 1: Atrial Fibrillation Lab Used: [facility] [place] Site: [address] INR POC 2.4 INR RANGE 2.0-3.0  Dietary changes: no    Health status changes: no    Bleeding/hemorrhagic complications: no    Recent/future hospitalizations: no    Any changes in medication regimen? no    Recent/future dental: no  Any missed doses?: no       Is patient compliant with meds? yes       Allergies: No Known Drug Allergies  Anticoagulation Management History:      The patient is taking warfarin and comes in today for a routine follow up visit.  Negative risk factors for bleeding include an age less than 37 years old.  The bleeding index is 'low risk'.  Positive CHADS2 values include History of HTN.  Negative CHADS2 values include Age > 43 years old.  Anticoagulation responsible provider: Tenny Craw MD, Gunnar Fusi.  INR POC: 2.4.  Cuvette Lot#: [id number].  Exp: 12/2010.    Anticoagulation Management Assessment/Plan:      The patient's current anticoagulation dose is Warfarin sodium 5 mg tabs: 1 tab every day.  The target INR is 2.0-3.0.  The next INR is due 10/14/2009.  Anticoagulation instructions were given to patient.  Results were reviewed/authorized by Weston Brass, PharmD.  He was notified by Weston Brass PharmD.         Prior Anticoagulation Instructions: INR 1.7  Take 2 tablets today, then 1.5 tablets tomorrow, then recheck coumadin.    Current Anticoagulation Instructions: INR 2.4  Increase Coumadin to 1 1/2 tablets every day except 1 tablet on Tuesday, Thursday and Saturday.  Stop Arixtra.

## 2010-05-03 NOTE — Miscellaneous (Signed)
  Clinical Lists Changes  Orders: Added new Referral order of Sleep Disorder Referral (Sleep Disorder) - Signed

## 2010-05-03 NOTE — Miscellaneous (Signed)
  Clinical Lists Changes  Medications: Removed medication of TRIAMTERENE-HCTZ 37.5-25 MG CAPS (TRIAMTERENE-HCTZ) 1 capsule by mouth every morning

## 2010-05-03 NOTE — Medication Information (Signed)
Summary: [facility]  Anticoagulant Therapy  Managed by: Weston Brass, PharmD Referring MD: Tenny Craw PCP: Dr Larna Daughters MD: Shirlee Latch MD, Freida Busman Indication 1: Atrial Fibrillation Lab Used: [facility] [place] Site: [address] INR RANGE 2.0-3.0          Comments: Dr. Shirlee Latch called.  Pt had TEE/DCCV today in hospital.  INR was 1.2.  Pt's wife sent to clinic to help with Lovenox coverage until INR >2.  Pt's wife unaware of what pharmacy patient uses or what type of insurance he has.  Pt weight- 132 kg.  Lovenox 150mg  unavailable in office.  We did have Arixtra samples.  Pt's wife given Arixtra 2.5mg  and 7.5mg  syringes.  Instructed to take 1 shot of both to make 10mg  total once daily.  Increased Coumadin to 7.5mg  x 3 days then 5mg  daily.  Appt to recheck INR on 7/5.  Pt instructed to call with any questions or concerns.   Allergies: No Known Drug Allergies  Anticoagulation Management History:      Negative risk factors for bleeding include an age less than 87 years old.  The bleeding index is 'low risk'.  Positive CHADS2 values include History of HTN.  Negative CHADS2 values include Age > 58 years old.  Anticoagulation responsible provider: Shirlee Latch MD, Adalin Vanderploeg.  Exp: 11/2010.    Anticoagulation Management Assessment/Plan:      The patient's current anticoagulation dose is Warfarin sodium 5 mg tabs: 1 tab every day.  The target INR is 2.0-3.0.  The next INR is due 10/05/2009.  Anticoagulation instructions were given to patient.  Results were reviewed/authorized by Weston Brass, PharmD.  He was notified by Bethena Midget, RN, BSN.         Prior Anticoagulation Instructions: INR 1.2  Take an extra 1/2 tablet today then resume same dose of 1 tablet every day.   Current Anticoagulation Instructions: Take Arixtra 2.5mg  and 7.5mg  injections together to make 10mg  total once daily.  Increase Coumadin to 7.5mg  x 3 days then 5mg  daily.  Recheck INR on 7/5.

## 2010-05-03 NOTE — Miscellaneous (Signed)
  Clinical Lists Changes  Medications: Removed medication of METOPROLOL TARTRATE 100 MG TABS (METOPROLOL TARTRATE) 1 tab two times a day Added new medication of METOPROLOL TARTRATE 50 MG TABS (METOPROLOL TARTRATE) 3 tabs 2 times per day

## 2010-05-03 NOTE — Medication Information (Signed)
Summary: rov/sp  Anticoagulant Therapy  Managed by: Weston Brass, PharmD Referring MD: Tenny Craw PCP: Dr Larna Daughters MD: Graciela Husbands MD, Viviann Spare Indication 1: Atrial Fibrillation Lab Used: [facility] [place] Site: [address] INR POC 3.1 INR RANGE 2.0-3.0  Dietary changes: yes       Details: pt has not been eating a lot of green vegetables, was counseled that he could eat more than he has been eating  Health status changes: no    Bleeding/hemorrhagic complications: no    Recent/future hospitalizations: no    Any changes in medication regimen? no    Recent/future dental: no  Any missed doses?: no       Is patient compliant with meds? yes       Allergies: No Known Drug Allergies  Anticoagulation Management History:      The patient is taking warfarin and comes in today for a routine follow up visit.  Negative risk factors for bleeding include an age less than 96 years old.  The bleeding index is 'low risk'.  Positive CHADS2 values include History of HTN.  Negative CHADS2 values include Age > 43 years old.  Anticoagulation responsible provider: Graciela Husbands MD, Viviann Spare.  INR POC: 3.1.  Cuvette Lot#: [id number].  Exp: 12/2010.    Anticoagulation Management Assessment/Plan:      The patient's current anticoagulation dose is Warfarin sodium 5 mg tabs: 1 tab every day.  The target INR is 2.0-3.0.  The next INR is due 10/25/2009.  Anticoagulation instructions were given to patient.  Results were reviewed/authorized by Weston Brass, PharmD.  He was notified by Dillard Cannon.         Prior Anticoagulation Instructions: INR 2.4  Increase Coumadin to 1 1/2 tablets every day except 1 tablet on Tuesday, Thursday and Saturday.  Stop Arixtra.   Current Anticoagulation Instructions: INR 3.1  Take 1/2 pill tomorrow.  Then continue same regimen of 1 tab on Tuesday, Thursday, and Saturday and 1.5 tabs on Sunday, Monday, Wednesday, and Friday.  Re-check INR in 10 days.

## 2010-05-03 NOTE — Letter (Signed)
Summary: Appointment - Missed  [facility], Main Office  [address]   [place], [place] [address]   Phone: [phone]  Fax: [phone]     March 02, 2010 MRN: [id number]   Jason Russell [address] [place], [place]  [address]   Dear Mr. Rezek,  Our records indicate you missed your appointment on  02/11/10 with Dr. Tenny Craw .t is very important that we reach you to reschedule this appointment. We look forward to participating in your health care needs. Please contact us at the number listed above at your earliest convenience to reschedule this appointment.     Sincerely,  Judie Grieve  [employer] Scheduling Team

## 2010-05-03 NOTE — Medication Information (Signed)
Summary: Jason Russell  Anticoagulant Therapy  Managed by: Weston Brass, PharmD Referring MD: Tenny Craw PCP: Dr Larna Daughters MD: Clifton James MD, Cristal Deer Indication 1: Atrial Fibrillation Lab Used: [facility] [place] Site: [address] INR POC 2.7 INR RANGE 2.0-3.0  Dietary changes: no    Health status changes: no    Bleeding/hemorrhagic complications: no    Recent/future hospitalizations: no    Any changes in medication regimen? no    Recent/future dental: no  Any missed doses?: no       Is patient compliant with meds? yes       Allergies: No Known Drug Allergies  Anticoagulation Management History:      The patient is taking warfarin and comes in today for a routine follow up visit.  Negative risk factors for bleeding include an age less than 59 years old.  The bleeding index is 'low risk'.  Positive CHADS2 values include History of HTN.  Negative CHADS2 values include Age > 59 years old.  Anticoagulation responsible provider: Clifton James MD, Cristal Deer.  INR POC: 2.7.  Cuvette Lot#: [id number].  Exp: 01/2011.    Anticoagulation Management Assessment/Plan:      The patient's current anticoagulation dose is Warfarin sodium 5 mg tabs: Use as directed by Anticoagulation Clinic.  The target INR is 2.0-3.0.  The next INR is due 12/07/2009.  Anticoagulation instructions were given to patient.  Results were reviewed/authorized by Weston Brass, PharmD.  He was notified by Gweneth Fritter, PharmD Candidate.         Prior Anticoagulation Instructions: INR 2.4  Continue on same dosage 1.5 tablets daily except 1 tablet on Tuesdays, Thursdays, and Saturdays.  Recheck in 2 weeks.    Current Anticoagulation Instructions: INR- 2.7  Continue taking 1.5 tablets (7.5mg ) every day except take 1 tablet (5mg ) on Tues, Thurs, and Sat.   Prescriptions: WARFARIN SODIUM 5 MG TABS (WARFARIN SODIUM) Use as directed by Anticoagulation Clinic  #40 x 3   Entered by:   Weston Brass PharmD  Authorized by:   Sherrill Raring, MD, [facility]   Signed by:   Weston Brass PharmD on 11/08/2009   Method used:   Electronically to        [id number] (retail)       [address] [place], [place]  [address]       Ph: [phone]       Fax: [phone]   RxID:   [id number]

## 2010-05-03 NOTE — Assessment & Plan Note (Signed)
Summary: rov/BNP 320/jml   Visit Type:  Follow-up Primary Provider:  Dr Ivory Broad   History of Present Illness: History of Present Illness: Patient is a 60 year old with a history of dyslipidemia, hypertension, obesity and tobacco use.  I saw him in April.  He was doing well at that time. The patient says fover the past couple weeks he has been more short of breath with activity.  Can't walk as far.  Denies chest pain.  No PND.  No edema.  Appetite is OK.  Denies palpitations.  No significant dizziness.   He has been seen by his primary MD.  Blood work and CXR done.  Presents for continued eval.  Current Medications (verified): 1)  Amlodipine Besylate 10 Mg Tabs (Amlodipine Besylate) .[name].. 1 Tab Once Daily 2)  Clonidine Hcl 0.3 Mg Tabs (Clonidine Hcl) .[name].. 1tab Two Times A Day 3)  Hydralazine Hcl 25 Mg Tabs (Hydralazine Hcl) .... Take One Tab Three Times Daily 4)  Lisinopril 40 Mg Tabs (Lisinopril) .... One Tab Once Daily 5)  Metoprolol Tartrate 50 Mg Tabs (Metoprolol Tartrate) .... 3 Tab Two Times A Day( Pt Thinks This Is The Med He Was Told To Stop) 6)  Simvastatin 40 Mg Tabs (Simvastatin) .[name].. 1 Tablet By Mouth At Bedtime 7)  Allopurinol 300 Mg Tabs (Allopurinol) .[name].. 1 Tab Two Times A Day \\par  8)  Niaspan 1000 Mg Cr-Tabs (Niacin (Antihyperlipidemic)) .[name].. 1 Tab At Bedtime 9)  Klor-Con 20 Meq Pack (Potassium Chloride) .[name].. 1 Tab Bid 10)  Furosemide 40 Mg Tabs (Furosemide) .... Take One Tablet Two Times A Day 11)  Amoxicillin 875 Mg Tabs (Amoxicillin) .[name].. 1 Tab Once Daily For Ten Days  Allergies (verified): No Known Drug Allergies  Past History:  Past medical, surgical, family and social histories (including risk factors) reviewed, and no changes noted (except as noted below).  Past Medical History: Reviewed history from 02/02/2008 and no changes required. Hypertension Dyslipidemia Obesity  Past Surgical History: Reviewed history from 02/02/2008 and no changes required. Hip  replacement 1999  Family History: Reviewed history from 02/02/2008 and no changes required. Dad - deceased (unknown cause) Mom - Hypertension 2 brothers  Social History: Reviewed history from 02/02/2008 and no changes required. Married Tobacco use (1ppd to 1/2ppd)  Vital Signs:  Patient profile:   60 year old male Height:      73 inches Weight:      290 pounds BMI:     38.40 Pulse rate:   134 / minute BP sitting:   130 / 80  (left arm) Cuff size:   large  Vitals Entered By: Burnett Kanaris, CNA (September 24, 2009 2:24 PM)  Physical Exam  Additional Exam:  Patient is an obese 60 year old in NAD HEENT:  Normocephalic, atraumatic. EOMI, PERRLA.  Neck: JVP is normal. No thyromegaly. No bruits.  Lungs: clear to auscultation. No rales no wheezes.  Heart: Iregular rate and rhythm. Normal S1, S2. No S3.   No significant murmurs. PMI not displaced.  Abdomen:  Supple, nontender. Normal bowel sounds. No masses. No hepatomegaly.  Extremities:   Good distal pulses throughout. No lower extremity edema.  Musculoskeletal :moving all extremities.  Neuro:   alert and oriented x3.    EKG  Procedure date:  09/24/2009  Findings:      Atrial fibrillation  139 bpm.  Nonspecic ST T wave changes.   Rhythm with NSVT.  Impression & Recommendations:  Problem # 1:  ATRIAL FIBRILLATION (ICD-427.31) This is the  first time this has been documented.  The patient thinks he may have stopped his metoprolol.   I would recommend stopping Nrovasc.  Restarting metoprolol at 100 two times a day The patient should start coumadin. Will check labs today. I discussed with the patient coumadin Rx with cardioversion in about 1 month vs TEE/Cardioversion.  WIll reassess on Monday. Echo will be scheduled. The following medications were removed from the medication list:    Metoprolol Tartrate 50 Mg Tabs (Metoprolol tartrate) .Marland KitchenMarland KitchenMarland KitchenMarland Kitchen 3 tab two times a day His updated medication list for this problem includes:     Metoprolol Tartrate 100 Mg Tabs (Metoprolol tartrate) .[name]... 1 tab two times a day    Warfarin Sodium 5 Mg Tabs (Warfarin sodium) .[name]... 1 tab every day  Orders: 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  Problem # 3:  HYPERTENSION, BENIGN (ICD-401.1) Will check. The following medications were removed from the medication list:    Amlodipine Besylate 10 Mg Tabs (Amlodipine besylate) .[name]... 1 tab once daily    Metoprolol Tartrate 50 Mg Tabs (Metoprolol tartrate) .Marland KitchenMarland KitchenMarland KitchenMarland Kitchen 3 tab two times a day His updated medication list for this problem includes:    Clonidine Hcl 0.3 Mg Tabs (Clonidine hcl) .[name]... 1tab two times a day    Hydralazine Hcl 25 Mg Tabs (Hydralazine hcl) .[name]... Take one tab three times daily    Lisinopril 40 Mg Tabs (Lisinopril) ..... One tab once daily    Furosemide 40 Mg Tabs (Furosemide) .[name]... Take one tablet two times a day    Metoprolol Tartrate 100 Mg Tabs (Metoprolol tartrate) .[name]... 1 tab two times a day  Orders: EKG w/ Interpretation (93000) T-Basic Metabolic Panel (41324-40102) T-CBC No Diff (72536-64403) T-TSH (47425-95638)  Problem # 4:  HYPERLIPIDEMIA-MIXED (ICD-272.4) will need to follow.   His updated medication list for this problem includes:    Simvastatin 40 Mg Tabs (Simvastatin) .[name]... 1 tablet by mouth at bedtime    Niaspan 1000 Mg Cr-tabs (Niacin (antihyperlipidemic)) .[name]... 1 tab at bedtime  Patient Instructions: 1)  Your physician recommends that you return for lab work in:lab work today 2)  STOP Norvasc 3)  START Metoprolol 100mg  2 times per day 4)  Take extra Lasix one half in the am Sat. and "Sunday 5)  Start Coumadin 5mg every day 6)  Your physician recommends that you schedule a follow-up appointment in:  Dr. 6/27 and Coumadin clinic on 6/27 7)  Your physician has requested that you have an echocardiogram.  Echocardiography is a painless test that uses sound waves to create images  of your heart. It provides your doctor with information about the size and shape of your heart and how well your heart's chambers and valves are working.  This procedure takes approximately one hour. There are no restrictions for this procedure. 09/29/2009 4pm Prescriptions: WARFARIN SODIUM 5 MG TABS (WARFARIN SODIUM) 1 tab every day  #30 x 1   Entered by:   Jacquelyn Reiss, RN, BSN   Authorized by:    [place] , MD, FACC   Signed by:   Jacquelyn Reiss, RN, BSN on 09/24/2009   Method used:   Electronically to        [facility] [address] [id number]* (retail)       [address]       [place], [place]  [address]       Ph: [phone]  Fax: [phone]   RxID:   [id number]     Appended Document: rov/BNP 320/jml Have asked him to take 1 1/2 Lasix in AM and 1 in PM on Saturday and Sunday.  Then resume 1 two times a day.

## 2010-05-03 NOTE — Medication Information (Signed)
Summary: rov/cs  Anticoagulant Therapy  Managed by: Tammy Sours, PharmD Referring MD: Tenny Craw PCP: Dr Larna Daughters MD: Gala Romney MD, Reuel Boom Indication 1: Atrial Fibrillation Lab Used: [facility] [place] Site: [address] INR POC 1.2 INR RANGE 2.0-3.0  Dietary changes: no    Health status changes: no    Bleeding/hemorrhagic complications: no    Recent/future hospitalizations: no    Any changes in medication regimen? no    Recent/future dental: no  Any missed doses?: no       Is patient compliant with meds? yes       Allergies: No Known Drug Allergies  Anticoagulation Management History:      The patient is taking warfarin and comes in today for a routine follow up visit.  Negative risk factors for bleeding include an age less than 47 years old.  The bleeding index is 'low risk'.  Positive CHADS2 values include History of HTN.  Negative CHADS2 values include Age > 19 years old.  Anticoagulation responsible provider: Danila Eddie MD, Reuel Boom.  INR POC: 1.2.  Cuvette Lot#: [id number].  Exp: 01/2011.    Anticoagulation Management Assessment/Plan:      The patient's current anticoagulation dose is Warfarin sodium 5 mg tabs: Use as directed by Anticoagulation Clinic.  The target INR is 2.0-3.0.  The next INR is due 01/13/2010.  Anticoagulation instructions were given to patient.  Results were reviewed/authorized by Tammy Sours, PharmD.         Prior Anticoagulation Instructions: INR 2.3    Continue on same dosage 1.5 tablets daily except 1 tablet on Tuesdays, Thursdays, and Saturdays.  Recheck in 4 weeks.    Current Anticoagulation Instructions: INR 1.2  Take Coumadin 2 tablets (10mg ) on Tuesday and Wednesday, then resume normal schedule of 7.5mg  on Sunday, Monday, Wednesday, and Friday, and 5mg  on Tuesday, Thursday, and Saturday.    Next INR 01/13/10 @ 345 pm
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
## 2010-05-03 NOTE — Assessment & Plan Note (Signed)
Summary: PER CHECK OUT/SF   Visit Type:  Follow-up Primary Provider:  Dr Ivory Broad  CC:  rov.  Pt states he is tired but otherwise doing well.  [name]  History of Present Illness: patient is a 60 year old with a history of hypetension, atrial fibrillation, possible sleep apnea. Since he was last in clinic he underent TEE cardioversion.  He converted to SR. SInce this, his breathing is much better.  He denies chest pains.  No dizziness. Recently had sleep study, has not heard results. Note that he got off work at 6:30 AM.  Has not taken meds yet today.  Current Medications (verified): 1)  Clonidine Hcl 0.3 Mg Tabs (Clonidine Hcl) .[name].. 1tab Two Times A Day 2)  Hydralazine Hcl 25 Mg Tabs (Hydralazine Hcl) .... Take One Tab Three Times Daily 3)  Lisinopril 40 Mg Tabs (Lisinopril) .... One Tab Once Daily 4)  Simvastatin 40 Mg Tabs (Simvastatin) .[name].. 1 Tablet By Mouth At Bedtime 5)  Allopurinol 300 Mg Tabs (Allopurinol) .[name].. 1 Tab Two Times A Day \\par  6)  Niaspan 1000 Mg Cr-Tabs (Niacin (Antihyperlipidemic)) .[name].. 1 Tab At Bedtime 7)  Klor-Con 20 Meq Pack (Potassium Chloride) .[name].. 1 Tab Bid 8)  Furosemide 40 Mg Tabs (Furosemide) .... Take One Tablet Two Times A Day 9)  Warfarin Sodium 5 Mg Tabs (Warfarin Sodium) .... Use As Directed By Anticoagulation Clinic 10)  Metoprolol Tartrate 50 Mg Tabs (Metoprolol Tartrate) .... 3 Tabs 2 Times Per Day  Allergies (verified): No Known Drug Allergies  Past History:  Past medical, surgical, family and social histories (including risk factors) reviewed, and no changes noted (except as noted below).  Past Medical History: Hypertension Dyslipidemia Obesity Atrial fibrillation. Sleep apnea  Past Surgical History: Reviewed history from 02/02/2008 and no changes required. Hip replacement 1999  Family History: Reviewed history from 02/02/2008 and no changes required. Dad - deceased (unknown cause) Mom - Hypertension 2 brothers  Social  History: Reviewed history from 02/02/2008 and no changes required. Married Tobacco use (1ppd to 1/2ppd)  Vital Signs:  Patient profile:   60 year old male Height:      73 inches Weight:      299 pounds BMI:     39.59 Pulse rate:   63 / minute Pulse rhythm:   regular BP sitting:   159 / 102  (left arm) Cuff size:   large  Vitals Entered By: Judithe Modest CMA (December 03, 2009 8:40 AM)  Physical Exam  Additional Exam:  Patient is in NAD HEENT:  Normocephalic, atraumatic. EOMI, PERRLA.  Neck: JVP is normal. No thyromegaly. No bruits.  Lungs: clear to auscultation. No rales no wheezes.  Heart: Regular rate and rhythm. Normal S1, S2. No S3.   No significant murmurs. PMI not displaced.  Abdomen:  Supple, nontender. Normal bowel sounds. No masses. No hepatomegaly.  Extremities:   Good distal pulses throughout. No lower extremity edema.  Musculoskeletal :moving all extremities.  Neuro:   alert and oriented x3.    EKG  Procedure date:  12/03/2009  Findings:      NSR.  68 bpm.  LVH.  T wave inversion III  Impression & Recommendations:  Problem # 1:  HYPERTENSION, BENIGN (ICD-401.1) Didn't take meds yet.  Usually takes them when he gets home for work in AM.  I suggested he take them when he gets up around Noon to 1 PM.  Better activity through the night. I  received prelim report of sleep study.  Patient  has moderate apnea with sats to 80%.  Tolerated CPAP trial.  I will refer to Pulmonary.  I would like to see how BP responds after CPAP started before changing regimn.  Problem # 2:  ATRIAL FIBRILLATION (ICD-427.31) Holding in SR.  He seems to sense afib with signif SOB.  Will continue meds.  Again, CPAP will be improtant to avoid recurrence.  Problem # 3:  HYPERLIPIDEMIA-MIXED (ICD-272.4) Keep on meds.  Will need to review last check. His updated medication list for this problem includes:    Simvastatin 40 Mg Tabs (Simvastatin) .[name]... 1 tablet by mouth at bedtime     Niaspan 1000 Mg Cr-tabs (Niacin (antihyperlipidemic)) .[name]... 1 tab at bedtime  Other Orders: EKG w/ Interpretation (93000)  Patient Instructions: 1)  You have been referred to [facility] September 19[facility] 10am arrival for 1015 am appointment. [address] [place]. 2)  Your physician recommends that you schedule a follow-up appointment in: 2 months with Dr. Tenny Craw Prescriptions: METOPROLOL TARTRATE 50 MG TABS (METOPROLOL TARTRATE) 3 tabs 2 times per day  #180 x 11   Entered by:   Layne Benton, RN, BSN   Authorized by:   Sherrill Raring, MD, [facility]   Signed by:   Layne Benton, RN, BSN on 12/03/2009   Method used:   Electronically to        [facility] # [phone]* (retail)       [address] [place], [place]  [address]       Ph: [phone]       Fax: [phone]   RxID:   [id number] METOPROLOL TARTRATE 50 MG TABS (METOPROLOL TARTRATE) 3 tabs 2 times per day  #180 x 3   Entered by:   Layne Benton, RN, BSN   Authorized by:   Sherrill Raring, MD, [facility]   Signed by:   Layne Benton, RN, BSN on 12/03/2009   Method used:   Electronically to        [facility]. [phone]* (retail)       [address]       [place], [place]  [address]       Ph: [phone]       Fax: [phone]   RxID:   [id number]

## 2010-05-03 NOTE — Medication Information (Signed)
Summary: rov/ln  Anticoagulant Therapy  Managed by: Cloyde Reams, RN, BSN Referring MD: Tenny Craw PCP: Dr Larna Daughters MD: Riley Kill MD, Maisie Fus Indication 1: Atrial Fibrillation Lab Used: [facility] [place] Site: [address] INR POC 2.4 INR RANGE 2.0-3.0  Dietary changes: yes       Details: Incr vit K in diet.  Health status changes: no    Bleeding/hemorrhagic complications: no    Recent/future hospitalizations: no    Any changes in medication regimen? no    Recent/future dental: no  Any missed doses?: no       Is patient compliant with meds? yes       Allergies: No Known Drug Allergies  Anticoagulation Management History:      The patient is taking warfarin and comes in today for a routine follow up visit.  Negative risk factors for bleeding include an age less than 79 years old.  The bleeding index is 'low risk'.  Positive CHADS2 values include History of HTN.  Negative CHADS2 values include Age > 68 years old.  Anticoagulation responsible provider: Riley Kill MD, Maisie Fus.  INR POC: 2.4.  Cuvette Lot#: [id number].  Exp: 01/2011.    Anticoagulation Management Assessment/Plan:      The patient's current anticoagulation dose is Warfarin sodium 5 mg tabs: 1 tab every day.  The target INR is 2.0-3.0.  The next INR is due 11/08/2009.  Anticoagulation instructions were given to patient.  Results were reviewed/authorized by Cloyde Reams, RN, BSN.  He was notified by Cloyde Reams RN.         Prior Anticoagulation Instructions: INR 3.1  Take 1/2 pill tomorrow.  Then continue same regimen of 1 tab on Tuesday, Thursday, and Saturday and 1.5 tabs on Sunday, Monday, Wednesday, and Friday.  Re-check INR in 10 days.   Current Anticoagulation Instructions: INR 2.4  Continue on same dosage 1.5 tablets daily except 1 tablet on Tuesdays, Thursdays, and Saturdays.  Recheck in 2 weeks.

## 2010-05-03 NOTE — Miscellaneous (Signed)
  Clinical Lists Changes  Medications: Removed medication of POTASSIUM CHLORIDE CRYS CR 20 MEQ CR-TABS (POTASSIUM CHLORIDE CRYS CR) Take one tablet by mouth daily

## 2010-05-03 NOTE — Miscellaneous (Signed)
Summary: Orders Update  Clinical Lists Changes  Orders: Added new Test order of TLB-Lipid Panel (80061-LIPID) - Signed Added new Test order of TLB-AST (SGOT) (84450-SGOT) - Signed 

## 2010-05-03 NOTE — Medication Information (Signed)
Summary: coumadin new started 5mg  09-24-09/sl  Anticoagulant Therapy  Managed by: Weston Brass, PharmD Referring MD: Tenny Craw PCP: Dr Larna Daughters MD: Shirlee Latch MD, Freida Busman Indication 1: Atrial Fibrillation Lab Used: [facility] [place] Site: [address] INR POC 1.2 INR RANGE 2.0-3.0  Dietary changes: yes       Details: ate collard greens yesterday   Health status changes: no    Bleeding/hemorrhagic complications: no    Recent/future hospitalizations: no    Any changes in medication regimen? no    Recent/future dental: no  Any missed doses?: no       Is patient compliant with meds? yes      Comments: Pt educated on bleeding risks, dietary concerns, medication interactions, and how to properly take medication.  Given contact information for any questions.   Allergies: No Known Drug Allergies  Anticoagulation Management History:      The patient is taking warfarin and comes in today for a routine follow up visit.  Negative risk factors for bleeding include an age less than 84 years old.  The bleeding index is 'low risk'.  Positive CHADS2 values include History of HTN.  Negative CHADS2 values include Age > 45 years old.  Anticoagulation responsible provider: Shirlee Latch MD, [place].  INR POC: 1.2.  Cuvette Lot#: [id number].  Exp: 11/2010.    Anticoagulation Management Assessment/Plan:      The patient's current anticoagulation dose is Warfarin sodium 5 mg tabs: 1 tab every day.  The target INR is 2.0-3.0.  The next INR is due 10/05/2009.  Anticoagulation instructions were given to patient.  Results were reviewed/authorized by Weston Brass, PharmD.  He was notified by Weston Brass PharmD.         Current Anticoagulation Instructions: INR 1.2  Take an extra 1/2 tablet today then resume same dose of 1 tablet every day.

## 2010-05-03 NOTE — Medication Information (Signed)
Summary: rov/cs  Anticoagulant Therapy  Managed by: Weston Brass, PharmD Referring MD: Tenny Craw PCP: Dr Larna Daughters MD: Ladona Ridgel MD, Sharlot Gowda Indication 1: Atrial Fibrillation Lab Used: [facility] [place] Site: [address] INR POC 2.0 INR RANGE 2.0-3.0  Dietary changes: no    Health status changes: no    Bleeding/hemorrhagic complications: no    Recent/future hospitalizations: no    Any changes in medication regimen? no    Recent/future dental: no  Any missed doses?: no       Is patient compliant with meds? yes       Allergies: No Known Drug Allergies  Anticoagulation Management History:      The patient is taking warfarin and comes in today for a routine follow up visit.  Negative risk factors for bleeding include an age less than 64 years old.  The bleeding index is 'low risk'.  Positive CHADS2 values include History of HTN.  Negative CHADS2 values include Age > 40 years old.  Anticoagulation responsible Gavriella Hearst: Ladona Ridgel MD, Sharlot Gowda.  INR POC: 2.0.  Cuvette Lot#: [id number].  Exp: 02/2011.    Anticoagulation Management Assessment/Plan:      The patient's current anticoagulation dose is Warfarin sodium 5 mg tabs: Use as directed by Anticoagulation Clinic.  The target INR is 2.0-3.0.  The next INR is due 01/27/2010.  Anticoagulation instructions were given to patient.  Results were reviewed/authorized by Weston Brass, PharmD.  He was notified by Haynes Hoehn, PharmD Candidate.         Prior Anticoagulation Instructions: INR 1.2  Take Coumadin 2 tablets (10mg ) on Tuesday and Wednesday, then resume normal schedule of 7.5mg  on Sunday, Monday, Wednesday, and Friday, and 5mg  on Tuesday, Thursday, and Saturday.    Next INR 01/13/10 @ 345 pm  Current Anticoagulation Instructions: INR 2.0  Continue Coumadin as scheduled:  1 and 1/2 tablets every day of the week, except 1 tablet on Tuesday, Thursday, and Saturday.    Return to clinic in 2 weeks.

## 2010-05-03 NOTE — Medication Information (Signed)
Summary: rov/sp  Anticoagulant Therapy  Managed by: Cloyde Reams, RN, BSN Referring MD: Tenny Craw PCP: Dr Larna Daughters MD: Graciela Husbands MD, Viviann Spare Indication 1: Atrial Fibrillation Lab Used: [facility] [place] Site: [address] INR POC 1.7 INR RANGE 2.0-3.0        Any missed doses?: yes     Details: Off coumadin for procedure resumed on 09/29/09 at 7.5mg  x 3 doses then 5mg  daily.      Allergies: No Known Drug Allergies  Anticoagulation Management History:      Negative risk factors for bleeding include an age less than 64 years old.  The bleeding index is 'low risk'.  Positive CHADS2 values include History of HTN.  Negative CHADS2 values include Age > 59 years old.  Anticoagulation responsible provider: Graciela Husbands MD, Viviann Spare.  INR POC: 1.7.  Exp: 11/2010.    Anticoagulation Management Assessment/Plan:      The patient's current anticoagulation dose is Warfarin sodium 5 mg tabs: 1 tab every day.  The target INR is 2.0-3.0.  The next INR is due 10/07/2009.  Anticoagulation instructions were given to patient.  Results were reviewed/authorized by Cloyde Reams, RN, BSN.  He was notified by Cloyde Reams RN.         Prior Anticoagulation Instructions: Take Arixtra 2.5mg  and 7.5mg  injections together to make 10mg  total once daily.  Increase Coumadin to 7.5mg  x 3 days then 5mg  daily.  Recheck INR on 7/5.   Current Anticoagulation Instructions: INR 1.7  Take 2 tablets today, then 1.5 tablets tomorrow, then recheck coumadin.    Appended Document: rov/sp Pt will continue on Arixtra 10mg  daily today and tomorrow while INR still less than 2.0. EWJ

## 2010-05-03 NOTE — Letter (Signed)
Summary: Out of Work  [facility], Main Office  [address]   [place], [place] [address]   Phone: [phone]  Fax: [phone]    October 07, 2009   Employee:  JAKEIM SEDORE    To Whom It May Concern:   For Medical reasons, please excuse the above named employee from work for the following dates:  Currently  End:May return to work on 10/11/2009    If you need additional information, please feel free to contact our office.         Sincerely,  Dr. Dietrich Pates  Layne Benton, RN, BSN

## 2010-05-03 NOTE — Assessment & Plan Note (Signed)
Summary: rov @ 9:15/sl   Visit Type:  Follow-up Primary Provider:  Dr Ivory Broad   History of Present Illness: patient is a 60 year old with a history of hypetension, atrial fibrillation, possible sleep apnea. Since he was last in clinic he underent TEE cardioversion.  He converted to SR. SInce this, his breathing is much better.  He denies chest pains.  No dizziness.  Current Medications (verified): 1)  Clonidine Hcl 0.3 Mg Tabs (Clonidine Hcl) .[name].. 1tab Two Times A Day 2)  Hydralazine Hcl 25 Mg Tabs (Hydralazine Hcl) .... Take One Tab Three Times Daily 3)  Lisinopril 40 Mg Tabs (Lisinopril) .... One Tab Once Daily 4)  Simvastatin 40 Mg Tabs (Simvastatin) .[name].. 1 Tablet By Mouth At Bedtime 5)  Allopurinol 300 Mg Tabs (Allopurinol) .[name].. 1 Tab Two Times A Day \\par  6)  Niaspan 1000 Mg Cr-Tabs (Niacin (Antihyperlipidemic)) .[name].. 1 Tab At Bedtime 7)  Klor-Con 20 Meq Pack (Potassium Chloride) .[name].. 1 Tab Bid 8)  Furosemide 40 Mg Tabs (Furosemide) .... Take One Tablet Two Times A Day 9)  Warfarin Sodium 5 Mg Tabs (Warfarin Sodium) .[name].. 1 Tab Every Day 10)  Metoprolol Tartrate 50 Mg Tabs (Metoprolol Tartrate) .... 3 Tabs 2 Times Per Day  Allergies (verified): No Known Drug Allergies  Past History:  Past medical, surgical, family and social histories (including risk factors) reviewed, and no changes noted (except as noted below).  Past Medical History: Hypertension Dyslipidemia Obesity Atrial fibrillation.  Past Surgical History: Reviewed history from 02/02/2008 and no changes required. Hip replacement 1999  Family History: Reviewed history from 02/02/2008 and no changes required. Dad - deceased (unknown cause) Mom - Hypertension 2 brothers  Social History: Reviewed history from 02/02/2008 and no changes required. Married Tobacco use (1ppd to 1/2ppd)  Vital Signs:  Patient profile:   60 year old male Height:      73 inches Weight:      295 pounds Pulse rate:   68 / minute BP  sitting:   148 / 90  (left arm) Cuff size:   large  Vitals Entered By: Kem Parkinson (October 07, 2009 9:02 AM)  Physical Exam  Additional Exam:  Patient an obese 60 year old in NAD HEENT:  Normocephalic, atraumatic. EOMI, PERRLA.  Neck: JVP is normal. No thyromegaly. No bruits.  Lungs: clear to auscultation. No rales no wheezes.  Heart: Regular rate and rhythm. Normal S1, S2. No S3.   No significant murmurs. PMI not displaced.  Abdomen:  Supple, nontender. Normal bowel sounds. No masses. No hepatomegaly.  Extremities:   Good distal pulses throughout. No lower extremity edema.  Musculoskeletal :moving all extremities.  Neuro:   alert and oriented x3.    EKG  Procedure date:  10/07/2009  Findings:      NSR.  66 bpm.  T wave inversion III, AVF  Impression & Recommendations:  Problem # 1:  ATRIAL FIBRILLATION (ICD-427.31) Remains in SR.  I would continue meds.  he needs a sleep study for apnea to try to prevent reversion to afib. His updated medication list for this problem includes:    Warfarin Sodium 5 Mg Tabs (Warfarin sodium) .[name]... 1 tab every day    Metoprolol Tartrate 50 Mg Tabs (Metoprolol tartrate) .Marland KitchenMarland KitchenMarland KitchenMarland Kitchen 3 tabs 2 times per day  Orders: EKG w/ Interpretation (93000)  Problem # 2:  HYPERTENSION, BENIGN (ICD-401.1) Keep on current regimen.  Did not take meds today.  Sleep study.  Problem # 3:  HYPERLIPIDEMIA-MIXED (ICD-272.4) Continue for  now His updated medication list for this problem includes:    Simvastatin 40 Mg Tabs (Simvastatin) .[name]... 1 tablet by mouth at bedtime    Niaspan 1000 Mg Cr-tabs (Niacin (antihyperlipidemic)) .[name]... 1 tab at bedtime  Problem # 4:  OVERWEIGHT/OBESITY (ICD-278.02) Encouraged activity.  Patient Instructions: 1)  Your physician recommends that you schedule a follow-up appointment in: Sept 2011 with Dr.Yara Tomkinson

## 2010-05-03 NOTE — Medication Information (Signed)
Summary: rov/ewj  Anticoagulant Therapy  Managed by: Lyna Poser, PharmD Referring MD: Tenny Craw PCP: Dr Larna Daughters MD: Clifton James MD, Cristal Deer Indication 1: Atrial Fibrillation Lab Used: [facility] [place] Site: [address] INR POC 2.2 INR RANGE 2.0-3.0  Dietary changes: no    Health status changes: no    Bleeding/hemorrhagic complications: no    Recent/future hospitalizations: no    Any changes in medication regimen? no    Recent/future dental: no  Any missed doses?: no       Is patient compliant with meds? yes       Allergies: No Known Drug Allergies  Anticoagulation Management History:      The patient is taking warfarin and comes in today for a routine follow up visit.  Negative risk factors for bleeding include an age less than 70 years old.  The bleeding index is 'low risk'.  Positive CHADS2 values include History of HTN.  Negative CHADS2 values include Age > 59 years old.  Anticoagulation responsible provider: Clifton James MD, Cristal Deer.  INR POC: 2.2.  Cuvette Lot#: [id number].  Exp: 03/2011.    Anticoagulation Management Assessment/Plan:      The patient's current anticoagulation dose is Warfarin sodium 5 mg tabs: Use as directed by Anticoagulation Clinic.  The target INR is 2.0-3.0.  The next INR is due 03/17/2010.  Anticoagulation instructions were given to patient.  Results were reviewed/authorized by Lyna Poser, PharmD.         Prior Anticoagulation Instructions: INR 2.4  Continue on same dosage 1.5 tablets daily except 1 tablet on Tuesdays, Thursdays, and Saturdays.  Recheck in 3 weeks.    Current Anticoagulation Instructions: INR 2.2 Continue taking 1 tablet on tuesday, thursday, and saturday. And 1.5 tablets all other days. Recheck in 4 weeks.

## 2010-05-03 NOTE — Letter (Signed)
Summary: Appointment - Missed  [facility], Main Office  [address]   [place], [place] [address]   Phone: [phone]  Fax: [phone]     March 10, 2010 MRN: [id number]   Jason Russell [address] [place], [place]  [address]   Dear Mr. Dunlop,  Our records indicate you missed your appointment on 02/11/10  with Dr. Tenny Craw .It is very important that we reach you to reschedule this appointment. We look forward to participating in your health care needs. Please contact us at the number listed above at your earliest convenience to reschedule this appointment.     Sincerely,  Judie Grieve  [facility] Scheduling Team

## 2010-05-03 NOTE — Assessment & Plan Note (Signed)
Summary: BP check.. maxzide was dc'd on 4/26/JR  Nurse Visit   Vital Signs:  Patient profile:   60 year old male Pulse rate:   60 / minute Pulse rhythm:   irregular Resp:     16 per minute BP sitting:   130 / 70  (left arm)  Vitals Entered By: nt  Allergies: No Known Drug Allergies  Appended Document: BP check.. maxzide was dc'd on 4/26/JR BP looks good.  Will f/u in clinic as scheduled.

## 2010-05-05 NOTE — Assessment & Plan Note (Signed)
Summary: f80m/mj   Referring Provider:  Dr. Tenny Craw Primary Provider:  Dr Ivory Broad  CC:  check up.  History of Present Illness: patient is a 60 year old with a history of hypertension, atrial fibrillation, obesitiy, tobacco use and sleep apnea.  I saw him earlier this year. Since I saw him he is using CPAP and is tolerating well.  He denies SOB.  No palpitations.  Continues to smoke.  Current Medications (verified): 1)  Clonidine Hcl 0.3 Mg Tabs (Clonidine Hcl) .[name].. 1tab Two Times A Day 2)  Hydralazine Hcl 25 Mg Tabs (Hydralazine Hcl) .... Take One Tab Three Times Daily 3)  Lisinopril 40 Mg Tabs (Lisinopril) .... One Tab Once Daily 4)  Simvastatin 40 Mg Tabs (Simvastatin) .[name].. 1 Tablet By Mouth At Bedtime 5)  Allopurinol 300 Mg Tabs (Allopurinol) .[name].. 1 Tab Two Times A Day \\par  6)  Niaspan 1000 Mg Cr-Tabs (Niacin (Antihyperlipidemic)) .[name].. 1 Tab At Bedtime 7)  Klor-Con 20 Meq Pack (Potassium Chloride) .[name].. 1 Tab Bid 8)  Furosemide 40 Mg Tabs (Furosemide) .... Take One Tablet Two Times A Day 9)  Warfarin Sodium 5 Mg Tabs (Warfarin Sodium) .... Use As Directed By Anticoagulation Clinic 10)  Metoprolol Tartrate 50 Mg Tabs (Metoprolol Tartrate) .... 3 Tabs 2 Times Per Day  Allergies: No Known Drug Allergies  Past History:  Past medical, surgical, family and social histories (including risk factors) reviewed, and no changes noted (except as noted below).  Past Medical History: Reviewed history from 12/20/2009 and no changes required. Hypertension Dyslipidemia Obesity Atrial fibrillation. OSA--AHI 33/hr 11/2009  Past Surgical History: Reviewed history from 02/02/2008 and no changes required. Hip replacement 1999  Family History: Reviewed history from 12/20/2009 and no changes required. Dad - deceased (unknown cause) Mom - Hypertension  Social History: Reviewed history from 12/20/2009 and no changes required. Married lives with wife Children--1 Tobacco use ( 1/2ppd). Started  1969.  Review of Systems       Reviewed.  Neg to the above problem.  Vital Signs:  Patient profile:   60 year old male Height:      73 inches Weight:      302 pounds BMI:     39.99 Pulse rate:   65 / minute Resp:     16 per minute BP sitting:   157 / 94  (left arm)  Vitals Entered By: Kem Parkinson (April 01, 2010 3:36 PM)  Physical Exam  Additional Exam:  Patient is in NAD HEENT:  Normocephalic, atraumatic. EOMI, PERRLA.  Neck: JVP is normal. No thyromegaly. No bruits.  Lungs: clear to auscultation. No rales no wheezes.  Heart: Regular rate and rhythm. Normal S1, S2. No S3.   No significant murmurs. PMI not displaced.  Abdomen:  Supple, nontender. Normal bowel sounds. No masses. No hepatomegaly.  Extremities:   Good distal pulses throughout. No lower extremity edema.  Musculoskeletal :moving all extremities.  Neuro:   alert and oriented x3.  BP on my check was 160/   Impression & Recommendations:  Problem # 1:  ATRIAL FIBRILLATION (ICD-427.31) Clinically in SR.  He senses his afib by fatigue.  Continue.  Problem # 2:  HYPERTENSION, BENIGN (ICD-401.1) BP is still out of control.  I have asked him to increase hydralazine to 50 three times a day.  Continue other meds. I am surprised.  I thought his bp would be better now that he is on CPAP.  Renal artery eval has been negative.  Claims he is taking meds. Will  repeat labs.  Check aldo level  Other Orders: T-Basic Metabolic Panel [id number]) T-CBC w/Diff [id number]) T-Lipid Profile [phone]) T-Aldosterone [id number]) T- * Misc. Laboratory test [phone])  Patient Instructions: 1)  Your physician recommends that you schedule a follow-up appointment in: 2 months 2)  Your physician recommends that you return for lab work [id number]:GYIRS-- BMET,CBC,ALDOSTERONE,LIPIDS,AST Prescriptions: NIASPAN 1000 MG CR-TABS (NIACIN (ANTIHYPERLIPIDEMIC)) 1 tab at bedtime  #90 x 3   Entered by:   Kem Parkinson   Authorized by:    Sherrill Raring, MD, [facility]   Signed by:   Kem Parkinson on 04/01/2010   Method used:   Electronically to        [facility] [phone]* (retail)       [address] [place], [place]  [address]       Ph: [phone]       Fax: [phone]   RxID:   [id number]   Appended Document: f73m/mj NSR:   68 bpm.    LVH.

## 2010-05-05 NOTE — Medication Information (Signed)
Summary: rov/eac  Anticoagulant Therapy  Managed by: Weston Brass, PharmD Referring MD: Tenny Craw PCP: Dr Larna Daughters MD: Ladona Ridgel MD, Sharlot Gowda Indication 1: Atrial Fibrillation Lab Used: [facility] [place] Site: [address] INR POC 2.4 INR RANGE 2.0-3.0  Dietary changes: no    Health status changes: no    Bleeding/hemorrhagic complications: no    Recent/future hospitalizations: no    Any changes in medication regimen? no    Recent/future dental: no  Any missed doses?: no       Is patient compliant with meds? yes       Allergies: No Known Drug Allergies  Anticoagulation Management History:      The patient is taking warfarin and comes in today for a routine follow up visit.  Negative risk factors for bleeding include an age less than 47 years old.  The bleeding index is 'low risk'.  Positive CHADS2 values include History of HTN.  Negative CHADS2 values include Age > 48 years old.  Anticoagulation responsible provider: Ladona Ridgel MD, Sharlot Gowda.  INR POC: 2.4.  Cuvette Lot#: [id number].  Exp: 05/2011.    Anticoagulation Management Assessment/Plan:      The patient's current anticoagulation dose is Warfarin sodium 5 mg tabs: Use as directed by Anticoagulation Clinic.  The target INR is 2.0-3.0.  The next INR is due 05/12/2010.  Anticoagulation instructions were given to patient.  Results were reviewed/authorized by Weston Brass, PharmD.  He was notified by Stephannie Peters, PharmD Candidate .         Prior Anticoagulation Instructions: INR 2.4  Continue taking 1 tablet on Tuesday, Thursday, and Saturday and 1.5 tablets all other days.  Return to clinic in 4 weeks.    Current Anticoagulation Instructions: INR 2.4  Coumadin 5 mg tablets - Continue 1.5 tablets every day except 1 tablet on Tuesdays, Thursdays and Saturdays

## 2010-05-05 NOTE — Medication Information (Signed)
Summary: rov/mw  Anticoagulant Therapy  Managed by: Eda Keys, PharmD Referring MD: Tenny Craw PCP: Dr Larna Daughters MD: Graciela Husbands MD, Viviann Spare Indication 1: Atrial Fibrillation Lab Used: [facility] [place] Site: [address] INR POC 2.4 INR RANGE 2.0-3.0  Dietary changes: no    Health status changes: no    Bleeding/hemorrhagic complications: no    Recent/future hospitalizations: no    Any changes in medication regimen? no    Recent/future dental: no  Any missed doses?: no       Is patient compliant with meds? yes       Allergies: No Known Drug Allergies  Anticoagulation Management History:      The patient is taking warfarin and comes in today for a routine follow up visit.  Negative risk factors for bleeding include an age less than 59 years old.  The bleeding index is 'low risk'.  Positive CHADS2 values include History of HTN.  Negative CHADS2 values include Age > 59 years old.  Anticoagulation responsible provider: Graciela Husbands MD, Viviann Spare.  INR POC: 2.4.  Cuvette Lot#: [id number].  Exp: 05/2011.    Anticoagulation Management Assessment/Plan:      The patient's current anticoagulation dose is Warfarin sodium 5 mg tabs: Use as directed by Anticoagulation Clinic.  The target INR is 2.0-3.0.  The next INR is due 04/14/2010.  Anticoagulation instructions were given to patient.  Results were reviewed/authorized by Eda Keys, PharmD.  He was notified by Eda Keys.         Prior Anticoagulation Instructions: INR 2.2 Continue taking 1 tablet on tuesday, thursday, and saturday. And 1.5 tablets all other days. Recheck in 4 weeks.   Current Anticoagulation Instructions: INR 2.4  Continue taking 1 tablet on Tuesday, Thursday, and Saturday and 1.5 tablets all other days.  Return to clinic in 4 weeks.

## 2010-05-05 NOTE — Progress Notes (Signed)
Summary: returning call  Phone Note Call from Patient Call back at Home Phone [phone]   Caller: Patient Summary of Call: Pt returning call from Friday Initial call taken by: Judie Grieve,  April 11, 2010 3:03 PM  Follow-up for Phone Call        Called patient with lab results.  Layne Benton, RN, BSN  April 11, 2010 3:10 PM

## 2010-05-12 ENCOUNTER — Encounter (INDEPENDENT_AMBULATORY_CARE_PROVIDER_SITE_OTHER): Payer: No Typology Code available for payment source

## 2010-05-12 ENCOUNTER — Encounter: Payer: Self-pay | Admitting: Internal Medicine

## 2010-05-12 DIAGNOSIS — Z7901 Long term (current) use of anticoagulants: Secondary | ICD-10-CM

## 2010-05-12 DIAGNOSIS — I4891 Unspecified atrial fibrillation: Secondary | ICD-10-CM

## 2010-05-19 ENCOUNTER — Ambulatory Visit (INDEPENDENT_AMBULATORY_CARE_PROVIDER_SITE_OTHER): Payer: No Typology Code available for payment source | Admitting: Internal Medicine

## 2010-05-19 ENCOUNTER — Encounter: Payer: Self-pay | Admitting: Internal Medicine

## 2010-05-19 DIAGNOSIS — I10 Essential (primary) hypertension: Secondary | ICD-10-CM

## 2010-05-19 DIAGNOSIS — E7800 Pure hypercholesterolemia, unspecified: Secondary | ICD-10-CM

## 2010-05-19 NOTE — Medication Information (Signed)
Summary: Coumadin Clinic  Anticoagulant Therapy  Managed by: Eda Keys, PharmD Referring MD: Tenny Craw PCP: Dr Larna Daughters MD: Johney Frame MD, Fayrene Fearing Indication 1: Atrial Fibrillation Lab Used: [facility] [place] Site: [address] INR POC 2.9 INR RANGE 2.0-3.0  Dietary changes: no    Health status changes: no    Bleeding/hemorrhagic complications: no    Recent/future hospitalizations: no    Any changes in medication regimen? no    Recent/future dental: yes     Details: Pt  had a single tooth extraction in Jan and was off coumadin for 1 day  Any missed doses?: no       Is patient compliant with meds? yes       Allergies: No Known Drug Allergies  Anticoagulation Management History:      The patient is taking warfarin and comes in today for a routine follow up visit.  Negative risk factors for bleeding include an age less than 46 years old.  The bleeding index is 'low risk'.  Positive CHADS2 values include History of HTN.  Negative CHADS2 values include Age > 38 years old.  Anticoagulation responsible provider: Riyanna Crutchley MD, Fayrene Fearing.  INR POC: 2.9.  Cuvette Lot#: [id number].  Exp: 04/2011.    Anticoagulation Management Assessment/Plan:      The patient's current anticoagulation dose is Warfarin sodium 5 mg tabs: Use as directed by Anticoagulation Clinic.  The target INR is 2.0-3.0.  The next INR is due 06/09/2010.  Anticoagulation instructions were given to patient.  Results were reviewed/authorized by Eda Keys, PharmD.  He was notified by Eda Keys.         Prior Anticoagulation Instructions: INR 2.4  Coumadin 5 mg tablets - Continue 1.5 tablets every day except 1 tablet on Tuesdays, Thursdays and Saturdays   Current Anticoagulation Instructions: INR 2.9  Continue taking 1 tablet on Tuesday, Thursday, and Saturday, and 1.5 tablets all other days.  Return to clinic in 4 weeks.

## 2010-05-23 DIAGNOSIS — I4891 Unspecified atrial fibrillation: Secondary | ICD-10-CM

## 2010-05-25 NOTE — Assessment & Plan Note (Signed)
Summary: 4:15/per check out/SF. F2M/lj   Visit Type:  follow up Referring Provider:  Dr. Tenny Craw Primary Provider:  Dr Ivory Broad  CC:  No complaints.  History of Present Illness: patient is a 60 year old with a history of hypertension, atrial fibrillation, obesitiy, tobacco use and sleep apnea.   I saw him in clinic in December.  At that time his BP was elevated.  I rec he increase his Hydralazine  to 50 mg 3x per day.  he did not do this.   he is feeling good.  Denies CP, SOB.  Using CPAP  Current Medications (verified): 1)  Clonidine Hcl 0.3 Mg Tabs (Clonidine Hcl) .[name].. 1tab Two Times A Day 2)  Hydralazine Hcl 25 Mg Tabs (Hydralazine Hcl) .... Take One Tab Three Times Daily 3)  Lisinopril 40 Mg Tabs (Lisinopril) .... One Tab Once Daily 4)  Simvastatin 40 Mg Tabs (Simvastatin) .[name].. 1 Tablet By Mouth At Bedtime 5)  Allopurinol 300 Mg Tabs (Allopurinol) .[name].. 1 Tab Two Times A Day \\par  6)  Niaspan 1000 Mg Cr-Tabs (Niacin (Antihyperlipidemic)) .[name].. 1 Tab At Bedtime 7)  Potassium Chloride Crys Cr 20 Meq Cr-Tabs (Potassium Chloride Crys Cr) .... Take One Tablet By Mouth Twice A Day 8)  Warfarin Sodium 5 Mg Tabs (Warfarin Sodium) .... Use As Directed By Anticoagulation Clinic 9)  Metoprolol Tartrate 50 Mg Tabs (Metoprolol Tartrate) .... 3 Tabs 2 Times Per Day  Allergies (verified): No Known Drug Allergies  Past History:  Past medical, surgical, family and social histories (including risk factors) reviewed, and no changes noted (except as noted below).  Past Medical History: Reviewed history from 12/20/2009 and no changes required. Hypertension Dyslipidemia Obesity Atrial fibrillation. OSA--AHI 33/hr 11/2009  Past Surgical History: Reviewed history from 02/02/2008 and no changes required. Hip replacement 1999  Family History: Reviewed history from 12/20/2009 and no changes required. Dad - deceased (unknown cause) Mom - Hypertension  Social History: Reviewed history from 12/20/2009  and no changes required. Married lives with wife Children--1 Tobacco use ( 1/2ppd). Started 1969.  Review of Systems       Reviewed.  NEg tao the above problme except as noted above.  Vital Signs:  Patient profile:   60 year old male Height:      73 inches Weight:      302 pounds BMI:     39.99 Pulse rate:   68 / minute BP sitting:   162 / 84  (left arm) Cuff size:   large  Vitals Entered By: Vikki Ports (May 19, 2010 4:51 PM)  Physical Exam  Additional Exam:  pateint in NAD Obese HEENT:  Normocephalic, atraumatic. EOMI, PERRLA.  Neck: JVP is normal. No thyromegaly. No bruits.  Lungs: clear to auscultation. No rales no wheezes.  Heart: Regular rate and rhythm. Normal S1, S2. No S3.   No significant murmurs. PMI not displaced.  Abdomen:  Supple, nontender. Normal bowel sounds. No masses. No hepatomegaly.  Extremities:   Good distal pulses throughout. No lower extremity edema.  Musculoskeletal :moving all extremities.  Neuro:   alert and oriented x3.    Impression & Recommendations:  Problem # 1:  HYPERTENSION, BENIGN (ICD-401.1) Increase hydralazine to 50 three times a day.   Refer to dietary for wt loss. The following medications were removed from the medication list:    Hydralazine Hcl 25 Mg Tabs (Hydralazine hcl) .[name]... Take one tab three times daily    Furosemide 40 Mg Tabs (Furosemide) .[name]... Take one tablet two times  a day His updated medication list for this problem includes:    Clonidine Hcl 0.3 Mg Tabs (Clonidine hcl) .[name]... 1tab two times a day    Lisinopril 40 Mg Tabs (Lisinopril) ..... One tab once daily    Metoprolol Tartrate 50 Mg Tabs (Metoprolol tartrate) .Marland KitchenMarland KitchenMarland KitchenMarland Kitchen 3 tabs 2 times per day    Hydralazine Hcl 50 Mg Tabs (Hydralazine hcl) .[name]... 1 tab 3 times per day  Problem # 2:  OBSTRUCTIVE SLEEP APNEA (ICD-327.23) Conitnue CPAP  Problem # 3:  ATRIAL FIBRILLATION (ICD-427.31) No sysmpt to suggest recurrance.  Keep on meds. His updated medication  list for this problem includes:    Warfarin Sodium 5 Mg Tabs (Warfarin sodium) ..... Use as directed by anticoagulation clinic    Metoprolol Tartrate 50 Mg Tabs (Metoprolol tartrate) .Marland KitchenMarland KitchenMarland KitchenMarland Kitchen 3 tabs 2 times per day  Problem # 4:  HYPERLIPIDEMIA-MIXED (ICD-272.4) Keep on meds His updated medication list for this problem includes:    Simvastatin 40 Mg Tabs (Simvastatin) .[name]... 1 tablet by mouth at bedtime    Niaspan 1000 Mg Cr-tabs (Niacin (antihyperlipidemic)) .[name]... 1 tab at bedtime  Problem # 5:  HYPERTENSION, BENIGN (ICD-401.1)  Other Orders: Nutrition Referral (Nutrition)  Patient Instructions: 1)  An appointment for you to see the Dietician has been scheduled. Please keep your appointment or be sure to call if you need to reschedule. 2)  Your physician wants you to follow-up in: October 2012 with Dr.Shakiyah Cirilo  You will receive a reminder letter in the mail two months in advance. If you don't receive a letter, please call our office to schedule the follow-up appointment. Prescriptions: HYDRALAZINE HCL 50 MG TABS (HYDRALAZINE HCL) 1 tab 3 times per day  #100 x 6   Entered by:   Layne Benton, RN, BSN   Authorized by:   Sherrill Raring, MD, [facility]   Signed by:   Layne Benton, RN, BSN on 05/19/2010   Method used:   [facility]ron[facility] to        [facility] [phone][facility] (retail)       [address] [place], [place]  [address]       Ph: [phone]       Fax: [phone]   RxID:   [id number]    Appended Document: 4:15/per check out/SF. F2M/lj Append:  SR.  68.  Nonspecfic ST changes.  PACs.

## 2010-05-30 ENCOUNTER — Encounter: Payer: No Typology Code available for payment source | Attending: Internal Medicine | Admitting: *Deleted

## 2010-05-30 DIAGNOSIS — E663 Overweight: Secondary | ICD-10-CM | POA: Insufficient documentation

## 2010-05-30 DIAGNOSIS — Z713 Dietary counseling and surveillance: Secondary | ICD-10-CM | POA: Insufficient documentation

## 2010-05-30 DIAGNOSIS — I10 Essential (primary) hypertension: Secondary | ICD-10-CM | POA: Insufficient documentation

## 2010-06-09 ENCOUNTER — Encounter: Payer: Self-pay | Admitting: Internal Medicine

## 2010-06-09 ENCOUNTER — Encounter (INDEPENDENT_AMBULATORY_CARE_PROVIDER_SITE_OTHER): Payer: No Typology Code available for payment source

## 2010-06-09 DIAGNOSIS — I4891 Unspecified atrial fibrillation: Secondary | ICD-10-CM

## 2010-06-09 DIAGNOSIS — Z7901 Long term (current) use of anticoagulants: Secondary | ICD-10-CM

## 2010-06-14 NOTE — Medication Information (Signed)
Summary: rov/eac  Anticoagulant Therapy  Managed by: Bethena Midget, RN, BSN Referring MD: Tenny Craw PCP: Dr Larna Daughters MD: Ladona Ridgel MD, Sharlot Gowda Indication 1: Atrial Fibrillation Lab Used: [facility] [place] Site: [address] INR POC 1.9 INR RANGE 2.0-3.0  Dietary changes: no    Health status changes: no    Bleeding/hemorrhagic complications: no    Recent/future hospitalizations: no    Any changes in medication regimen? no    Recent/future dental: no  Any missed doses?: no       Is patient compliant with meds? yes       Allergies: No Known Drug Allergies  Anticoagulation Management History:      The patient is taking warfarin and comes in today for a routine follow up visit.  Negative risk factors for bleeding include an age less than 39 years old.  The bleeding index is 'low risk'.  Positive CHADS2 values include History of HTN.  Negative CHADS2 values include Age > 23 years old.  Anticoagulation responsible provider: Ladona Ridgel MD, Sharlot Gowda.  INR POC: 1.9.  Cuvette Lot#: [id number].  Exp: 06/2011.    Anticoagulation Management Assessment/Plan:      The patient's current anticoagulation dose is Warfarin sodium 5 mg tabs: Use as directed by Anticoagulation Clinic.  The target INR is 2.0-3.0.  The next INR is due 07/07/2010.  Anticoagulation instructions were given to patient.  Results were reviewed/authorized by Bethena Midget, RN, BSN.  He was notified by Bethena Midget, RN, BSN.         Prior Anticoagulation Instructions: INR 2.9  Continue taking 1 tablet on Tuesday, Thursday, and Saturday, and 1.5 tablets all other days.  Return to clinic in 4 weeks.     Current Anticoagulation Instructions: INR 1.9 Today take extra 1/2 pill then resume 1 1/2 pills everyday except 1 pill on Tuesdays, Thursdays and Saturdays. Recheck in 4 weeks.

## 2010-06-20 LAB — PROTIME-INR: Prothrombin Time: 15.7 seconds — ABNORMAL HIGH (ref 11.6–15.2)

## 2010-06-26 ENCOUNTER — Other Ambulatory Visit: Payer: Self-pay | Admitting: Internal Medicine

## 2010-06-30 NOTE — Telephone Encounter (Signed)
[address] °

## 2010-07-07 ENCOUNTER — Ambulatory Visit (INDEPENDENT_AMBULATORY_CARE_PROVIDER_SITE_OTHER): Payer: No Typology Code available for payment source | Admitting: *Deleted

## 2010-07-07 DIAGNOSIS — I4891 Unspecified atrial fibrillation: Secondary | ICD-10-CM

## 2010-07-07 DIAGNOSIS — Z7901 Long term (current) use of anticoagulants: Secondary | ICD-10-CM | POA: Insufficient documentation

## 2010-07-07 LAB — POCT INR: INR: 2.3

## 2010-07-20 ENCOUNTER — Other Ambulatory Visit: Payer: Self-pay | Admitting: *Deleted

## 2010-07-20 MED ORDER — CLONIDINE HCL 0.3 MG PO TABS: 0.3000 mg | ORAL_TABLET | Freq: Two times a day (BID) | ORAL | Status: DC

## 2010-07-29 ENCOUNTER — Other Ambulatory Visit: Payer: Self-pay | Admitting: *Deleted

## 2010-07-29 MED ORDER — POTASSIUM CHLORIDE CRYS ER 20 MEQ PO TBCR: 20.0000 meq | EXTENDED_RELEASE_TABLET | Freq: Two times a day (BID) | ORAL | Status: DC

## 2010-08-04 ENCOUNTER — Ambulatory Visit (INDEPENDENT_AMBULATORY_CARE_PROVIDER_SITE_OTHER): Payer: No Typology Code available for payment source | Admitting: *Deleted

## 2010-08-04 DIAGNOSIS — I4891 Unspecified atrial fibrillation: Secondary | ICD-10-CM

## 2010-08-16 NOTE — Assessment & Plan Note (Signed)
[name]                            CARDIOLOGY OFFICE NOTE   NAME:Jason Russell, Jason Russell                     MRN:          [id number]  DATE:08/30/2006                            DOB:          02-Oct-1950    IDENTIFICATION:  Jason Russell is a 60 year old gentleman with a history  of hypertension and dyslipidemia. I last saw him back in April.   When I saw him, I increased his Clonidine to 0.3 mg b.i.d. and  Lisinopril to 40 mg. Note that MRI was also scheduled, this was done,  and it showed normal appearance of the kidneys, left adrenal had some  thickening, question hyperplasia or small adenoma, and did not recommend  a specific follow up. The renal arteries were well visualized and there  was no evidence of renal artery stenosis.   CURRENT MEDICATIONS:  1. Clonidine 0.3 mg b.i.d.  2. Lisinopril 40 mg.  3. Toprol XL 300 mg daily.  4. Allopurinol 300 mg daily.  5. Vytorin 10/10 daily.  6. Potassium 20 mEq b.i.d.  7. Norvasc 10 mg daily.  8. Hydrochlorothiazide 20 mg daily.   PHYSICAL EXAMINATION:  GENERAL:  The patient is in no distress. He  claims that he is taking all of his medicines.  VITAL SIGNS:  Blood pressure 156/105, pulse 84 and regular, weight 303  up from 297.  LUNGS:  Clear.  CARDIAC:  Regular rate and rhythm, S1, S2, no S3.  ABDOMEN:  Benign, obese.  EXTREMITIES:  No edema.   IMPRESSION:  1. Hypertension. I still am not convinced that he is taking his      medicines on 0.3 mg b.i.d. of Clonidine and the high dose of      Toprol, his heart rate is 84. Still, he ensures that he is and he      has had some filled at [facility]. We will add hydralazine 25 mg      t.i.d. to his regimen and encouraged him to walk.  2. Dyslipidemia. We will switch him over to Simvastatin and follow up      this summer.   I will set follow up for six weeks, sooner if problems develop.     Pricilla Riffle, MD, [facility]  Electronically Signed   PVR/MedQ   DD: 08/30/2006  DT: 08/31/2006  Job #: [id number]

## 2010-08-16 NOTE — Assessment & Plan Note (Signed)
[facility]                            CARDIOLOGY OFFICE NOTE   NAME:Jason Russell, Jason Russell                     MRN:          [id number]  DATE:11/02/2006                            DOB:          Dec 15, 1950    IDENTIFICATION:  The patient is a 60 year old gentleman with a history  of hypertension, dyslipidemia.  I last saw him at the end of May.   When I saw him last, I added hydralazine 25 t.i.d. to his regimen.  It  turns out he took it for a month and there must not have been a refill,  because he stopped taking it.  Also, I switched him to simvastatin.  Again, that ran out and he stopped taking it.   Otherwise, he is doing well.  He denies any chest pain, shortness of  breath.  He has not taken his blood pressure.   CURRENT MEDICATIONS:  1. Clonidine 0.3 b.i.d.  2. Lisinopril 40.  3. Toprol XL 300 daily.  4. Allopurinol 300.  5. KCl 20 b.i.d.  6. Norvasc 10.  7. Hydrochlorothiazide 25.   PHYSICAL EXAM:  The patient is in no distress.  Blood pressure is 134/91, pulse 74, weight 303.  LUNGS:  Clear.  CARDIAC:  Regular rate and rhythm.  S1, S2.  No S3.  No murmurs.  ABDOMEN:  Benign.  Obese.  EXTREMITIES:  No edema.   IMPRESSION:  1. Hypertension.  I added back the hydralazine.  Note, again he had      normal abdominal MRI.  Would follow up in about 8 weeks.  2. Health care maintenance.  Start back on Zocor.  Again, will need to      have followup in about 8 weeks' time as well.     Pricilla Riffle, MD, [facility]  Electronically Signed    PVR/MedQ  DD: 11/02/2006  DT: 11/03/2006  Job #: [id number]

## 2010-08-16 NOTE — Assessment & Plan Note (Signed)
[facility]                            CARDIOLOGY OFFICE NOTE   NAME:Russell, Jason                     MRN:          [id number]  DATE:01/31/2007                            DOB:          February 28, 1951    IDENTIFICATION:  [name] is a 60 year old gentleman with the  history of hypertension, dyslipidemia.  I last saw him in Dec 02, 2022.   When I saw him last, there was some confusion.  I had added hydralazine  to his regimen, but he ran out and did not start taking it again.  Note:  He has significant hypertension, is on several medicines.  He has had a  normal abdominal MRI, however.   Since seeing him, he is on the hydralazine.  He has felt well.  He  denies chest pressure nor shortness of breath.  Note:  His mother died  in Dec 02, 2022, after I saw him, from probable heart failure.  She was 80.   CURRENT MEDICATIONS INCLUDE:  1. Clonidine 0.3 b.i.d.  2. Lisinopril 40.  3. Toprol XL 300.  4. Allopurinol 300.  5. Potassium 20 mEq b.i.d.  6. Norvasc 10.  7. Hydrochlorothiazide 25.  8. Hydralazine 25 t.i.d.  9. Simvastatin 20.   PHYSICAL EXAM:  On exam, the patient is in no distress.  Blood pressure is 118/74, pulse is 68 and regular, weight 308 pounds, up  5 pounds from 2022/12/02.  LUNGS:  Clear.  NECK:  No apparent JVD.  CARDIAC EXAM:  Regular rate and rhythm, S1, S2, no S3, no significant  murmurs, distant heart sounds.  EXTREMITIES:  No edema.   Twelve-lead EKG:  Sinus rhythm, 68 beats per minute.   IMPRESSION:  1. Hypertension, difficult to control, but now under control.  I would      continue.  Will check a BMET in the near future.  2. Dyslipidemia, on simvastatin.  He had been on Vytorin.  We will      check followup lipids, AST in January.   I will see him otherwise at the end of March, sooner if problems  develop.  Continue on current regimen.     Pricilla Riffle, MD, [facility]  Electronically Signed    PVR/MedQ  DD: 01/31/2007  DT:  02/01/2007  Job #: [id number]

## 2010-08-16 NOTE — Assessment & Plan Note (Signed)
[facility]                            CARDIOLOGY OFFICE NOTE   NAME:Jason Russell, Jason Russell                     MRN:          [id number]  DATE:01/31/2008                            DOB:          18-Jun-1950    IDENTIFICATION:  Jason Russell is a 60 year old gentleman with  hypertension, dyslipidemia.  I last saw him back in March.   In the interval, he has done okay.  He is actually cutting back on his  smoking, is now down to a half-pack per day.  Denies chest pressure.  No  significant shortness of breath.   CURRENT MEDICATIONS:  1. Clonidine 0.3 b.i.d.  2. Lisinopril 40.  3. Allopurinol 300.  4. Norvasc 10.  5. Hydrochlorothiazide 25.  6. Simvastatin 20.  7. Hydralazine 50 a.m., 25 p.m.  8. Niaspan 500.  9. Metoprolol 150 b.i.d.  10.Potassium 2 (note, K had been low).   PHYSICAL EXAMINATION:  GENERAL:  The patient is in no distress.  VITAL SIGNS:  Blood pressure 110/90 (the patient reports it has been  better), pulse 63 and regular, weight 299 stable.  NECK:  No JVD.  LUNGS:  Clear without rales.  CARDIAC:  Regular rate and rhythm.  S1, S2.  No S3, no significant  murmurs.  ABDOMEN:  Benign.  EXTREMITIES:  No edema.   A 12-lead EKG, normal sinus rhythm, 63 beats per minute, nonspecific T-  wave changes.   IMPRESSION:  1. Hypertension, adequate control.  Again, the patient will follow.  I      have encouraged him to try to get his weight down, which would      help.  2. Dyslipidemia.  We will check fasting lipids today.  3. Health care maintenance.  I congratulated him on his smoking,      encouraged him to try to stay active, and get his weight down.      This should help with his hypertension.   I will set follow up for the spring.  We will check a BMET, Lipomed, AST  today.     Pricilla Riffle, MD, [facility]  Electronically Signed    PVR/MedQ  DD: 01/31/2008  DT: 02/01/2008  Job #: [id number]

## 2010-08-16 NOTE — Assessment & Plan Note (Signed)
[facility]                            CARDIOLOGY OFFICE NOTE   NAME:Jason Russell, Jason Russell                     MRN:          [id number]  DATE:06/14/2007                            DOB:          05-14-1950    IDENTIFICATION:  The patient is a 60 year old gentleman with  hypertension and dyslipidemia.  I last saw him in October.   In the interval, he has done okay.  He is active, trying to pull his  weight down.  He denies chest pain.  Still smoking about a pack per day.   His current medicines include:  1. Clonidine 0.3 b.i.d.  2. Lisinopril 40.  3. Allopurinol 300.  4. Potassium 20 mEq b.i.d.  5. Norvasc 10.  6. Hydrochlorothiazide 25.  7. Simvastatin 20.  8. Metoprolol 100 t.i.d.  9. Hydralazine 50 mg a.m., 25 p.m.   PHYSICAL EXAMINATION:  The patient is in no distress.  Blood pressure is  120/74, pulse is 81 and regular, weight 300 down from 308 last visit.  LUNGS:  Clear with no wheezes.  CARDIAC EXAM:  Regular rate and rhythm.  S1, S2.  No murmurs.  ABDOMEN:  Benign.  EXTREMITIES:  No edema.   IMPRESSION:  1. Hypertension, good control last couple of times.  I would keep him      on the same regimen.  Encouraged him to stay active, try to get his      weight down.  2. Dyslipidemia.  He came fasting today.  Will check a lipid panel,      AST and BMET.   He has got a little upper respiratory infection but overall drainage is  clear.  No evidence for bacterial and examination is really negative.   I will be in touch with the patient regarding the blood work and I will  set to see him back in the fall otherwise.     Pricilla Riffle, MD, [facility]  Electronically Signed    PVR/MedQ  DD: 06/14/2007  DT: 06/16/2007  Job #: [id number]

## 2010-08-19 NOTE — Assessment & Plan Note (Signed)
[facility]                            CARDIOLOGY OFFICE NOTE   NAME:LAWRENCEHolten, Russell                     MRN:          [id number]  DATE:06/04/2006                            DOB:          [birth year]    June 04, 2006   IDENTIFICATION:  [name] is a 60 year old gentleman I last saw in  clinic back in May of last year.  He says he has been feeling well.  Note he called in the other day though, and his drug company is no  longer carrying Cardene so he is out of that.   CURRENT MEDICATIONS:  1. Toprol XL 300 daily.  2. Cardene, he is out.  3. Allopurinol 300 daily.  4. Clonidine 0.2 b.i.d.  5. Vytorin 10/10 daily.  6. Lisinopril 20 daily (previously 40).   PHYSICAL EXAMINATION:  GENERAL APPEARANCE:  Patient is in no distress.  He is an obese 60 year old.  VITAL SIGNS:  Blood pressure 168/114, pulses 87, weight 301 up from 299.  This is relatively stable.  NECK:  Thyroid slightly prominent.  No JVD.  LUNGS:  Clear.  CARDIOVASCULAR:  Regular rate and rhythm.  S1 and S2, no S3, no murmurs.  ABDOMEN:  Benign.  No right upper quadrant tenderness.  EXTREMITIES:  No edema.   IMPRESSION:  1. Hypertension, out of control.  I gave him a prescription for 10 of      Norvasc.  I have also given him a prescription for      hydrochlorothiazide 12.5 to add on this.  Note, he is on      allopurinol for gout.  I do not think this should trouble that      much.  He has no allergies.  I would like to follow him up on six      weeks.   He says he is taking his medicines but his heart rate today is in the  80s on 300 of Toprol and I question this.  1. Dyslipidemia.  Follow fasting lipid panel today.  Again, will check      thyroid, check BMET.     Pricilla Riffle, MD, [facility]  Electronically Signed   PVR/MedQ  DD: 06/04/2006  DT: 06/04/2006  Job #: [id number]   cc:   [facility]

## 2010-08-19 NOTE — Assessment & Plan Note (Signed)
[name]                            CARDIOLOGY OFFICE NOTE   NAME:LAWRENCETereso, Jason Russell                     MRN:          [id number]  DATE:07/16/2006                            DOB:          Sep 06, 1950    IDENTIFICATION:  [name] is a 60 year old gentleman who I followed  in cardiology clinic.  He has a history of hypertension that has been  difficult to control questioning compliance in the past.   I saw him back in March and gave him prescription for Norvasc 10 and  also a prescription for hydrochlorothiazide 12.5.   In the interval, he is doing okay and denies shortness of breath.  He is  not that active with no chest pain.  He says he is taking all his  medicines.   CURRENT MEDICATIONS:  1. Toprol XL 300 mg daily.  2. Clonidine 0.2 mg b.i.d.  3. Allopurinol 300 mg daily.  4. Vytorin 10/10 nightly.  5. Lisinopril 20 mg daily.  6. Potassium 20 mEq daily.  7. Norvasc 10 daily.  8. Hydrochlorothiazide 25 mg daily.   PHYSICAL EXAMINATION:  GENERAL:  The patient is in no distress.  VITAL SIGNS:  Blood pressure 173/107, pulse 83, weight 297 stable from  301.  LUNGS:  Clear, no rales.  CARDIAC:  Regular rate and rhythm.  S1, S2, no S3.  No significant  murmurs.  ABDOMEN:  Benign, no hepatomegaly.  EXTREMITIES:  Trace edema.   A 12-lead EKG with sinus rhythm with PACs versus 2 to 1 AV conduction.   IMPRESSION/PLAN:  1. Hypertension.  Again, I wonder if he is taking things as he should      with his heart rate at 82 on the agents he is on.  I would increase      his clonidine to 0.3 b.i.d., increase lisinopril to 40 mg.  He has      had ultrasounds of his abdomen before which have been difficult      with no significant disease.  This was in 2002.  Will set him up      for an MRI of his renal arteries.  Follow up in 4-6 weeks.  2. Dyslipidemia.  Recent lipid panel on March 3, with LDL 71, HDL 30.      Potassium at the time      was 3.2  and we added potassium.  Will need to readdress when he      comes back about his HDL.  Encourage him to increase his physical      activity.     Pricilla Riffle, MD, [facility]  Electronically Signed    PVR/MedQ  DD: 07/16/2006  DT: 07/17/2006  Job #: [id number]   cc:   [facility]

## 2010-09-01 ENCOUNTER — Ambulatory Visit (INDEPENDENT_AMBULATORY_CARE_PROVIDER_SITE_OTHER): Payer: No Typology Code available for payment source | Admitting: *Deleted

## 2010-09-01 DIAGNOSIS — I4891 Unspecified atrial fibrillation: Secondary | ICD-10-CM

## 2010-09-29 ENCOUNTER — Ambulatory Visit (INDEPENDENT_AMBULATORY_CARE_PROVIDER_SITE_OTHER): Payer: No Typology Code available for payment source | Admitting: *Deleted

## 2010-09-29 DIAGNOSIS — I4891 Unspecified atrial fibrillation: Secondary | ICD-10-CM

## 2010-10-20 ENCOUNTER — Telehealth: Payer: Self-pay | Admitting: Internal Medicine

## 2010-10-20 MED ORDER — LISINOPRIL 40 MG PO TABS: 40.0000 mg | ORAL_TABLET | Freq: Every day | ORAL | Status: DC

## 2010-10-20 MED ORDER — SIMVASTATIN 40 MG PO TABS: 40.0000 mg | ORAL_TABLET | Freq: Every day | ORAL | Status: DC

## 2010-10-20 NOTE — Telephone Encounter (Signed)
Pt needs all of his meds to be sent to his pharmacy as a 90 days supply with 3 refills.

## 2010-10-20 NOTE — Telephone Encounter (Signed)
RX sent into pharmacy. Pt notified. 

## 2010-10-20 NOTE — Telephone Encounter (Signed)
Refill of simvastatin 40 mg, uses [facility]

## 2010-10-27 ENCOUNTER — Ambulatory Visit (INDEPENDENT_AMBULATORY_CARE_PROVIDER_SITE_OTHER): Payer: No Typology Code available for payment source | Admitting: *Deleted

## 2010-10-27 DIAGNOSIS — I4891 Unspecified atrial fibrillation: Secondary | ICD-10-CM

## 2010-10-27 LAB — POCT INR: INR: 2

## 2010-11-09 ENCOUNTER — Telehealth: Payer: Self-pay | Admitting: Internal Medicine

## 2010-11-09 ENCOUNTER — Other Ambulatory Visit: Payer: Self-pay | Admitting: Internal Medicine

## 2010-11-09 MED ORDER — WARFARIN SODIUM 5 MG PO TABS: ORAL_TABLET | ORAL | Status: DC

## 2010-11-09 NOTE — Telephone Encounter (Signed)
Pt [facility] [phone]

## 2010-11-24 ENCOUNTER — Encounter: Payer: No Typology Code available for payment source | Admitting: *Deleted

## 2010-11-25 ENCOUNTER — Other Ambulatory Visit: Payer: Self-pay | Admitting: Internal Medicine

## 2010-11-30 ENCOUNTER — Ambulatory Visit (INDEPENDENT_AMBULATORY_CARE_PROVIDER_SITE_OTHER): Payer: No Typology Code available for payment source | Admitting: *Deleted

## 2010-11-30 DIAGNOSIS — I4891 Unspecified atrial fibrillation: Secondary | ICD-10-CM

## 2010-11-30 LAB — POCT INR: INR: 1.5

## 2010-12-14 ENCOUNTER — Ambulatory Visit (INDEPENDENT_AMBULATORY_CARE_PROVIDER_SITE_OTHER): Payer: No Typology Code available for payment source | Admitting: *Deleted

## 2010-12-14 DIAGNOSIS — I4891 Unspecified atrial fibrillation: Secondary | ICD-10-CM

## 2010-12-14 LAB — POCT INR: INR: 3.4

## 2010-12-30 ENCOUNTER — Encounter: Payer: Self-pay | Admitting: Internal Medicine

## 2011-01-02 ENCOUNTER — Ambulatory Visit (INDEPENDENT_AMBULATORY_CARE_PROVIDER_SITE_OTHER): Payer: No Typology Code available for payment source | Admitting: Internal Medicine

## 2011-01-02 ENCOUNTER — Ambulatory Visit (INDEPENDENT_AMBULATORY_CARE_PROVIDER_SITE_OTHER): Payer: No Typology Code available for payment source | Admitting: *Deleted

## 2011-01-02 ENCOUNTER — Encounter: Payer: Self-pay | Admitting: Internal Medicine

## 2011-01-02 DIAGNOSIS — G4733 Obstructive sleep apnea (adult) (pediatric): Secondary | ICD-10-CM

## 2011-01-02 DIAGNOSIS — I10 Essential (primary) hypertension: Secondary | ICD-10-CM

## 2011-01-02 DIAGNOSIS — I119 Hypertensive heart disease without heart failure: Secondary | ICD-10-CM

## 2011-01-02 DIAGNOSIS — I4891 Unspecified atrial fibrillation: Secondary | ICD-10-CM

## 2011-01-02 DIAGNOSIS — E785 Hyperlipidemia, unspecified: Secondary | ICD-10-CM

## 2011-01-02 DIAGNOSIS — F17200 Nicotine dependence, unspecified, uncomplicated: Secondary | ICD-10-CM

## 2011-01-02 LAB — POCT INR: INR: 2.1

## 2011-01-02 NOTE — Patient Instructions (Signed)
Lab work today We will call you with results. 

## 2011-01-02 NOTE — Progress Notes (Signed)
HPIpatient is a 60 year old with a history of hypertension, atrial fibrillation, obesitiy, tobacco use and sleep apnea. I saw him last December. Since seen he says he is feeling good.  No Chest pain.  No palpitations.  No SOB.     No Known Allergies  Current Outpatient Prescriptions  Medication Sig Dispense Refill  . allopurinol (ZYLOPRIM) 300 MG tablet 1 tab bid      . buPROPion (WELLBUTRIN SR) 150 MG 12 hr tablet 1 tab twice a aday      . cloNIDine (CATAPRES) 0.3 MG tablet Take 1 tablet (0.3 mg total) by mouth 2 (two) times daily.  60 tablet  10  . hydrALAZINE (APRESOLINE) 50 MG tablet 1 tab three times a day      . metoprolol (LOPRESSOR) 50 MG tablet TAKE 3 TABLETS BY MOUTH TWICE A DAY  540 tablet  3  . NIASPAN 1000 MG CR tablet 1 tab daily      . potassium chloride SA (KLOR-CON M20) 20 MEQ tablet Take 1 tablet (20 mEq total) by mouth 2 (two) times daily.  180 tablet  3  . simvastatin (ZOCOR) 40 MG tablet Take 1 tablet (40 mg total) by mouth at bedtime.  90 tablet  3  . warfarin (COUMADIN) 5 MG tablet Take as directed by Anticoagulation clinic   40 tablet  3  . lisinopril (PRINIVIL,ZESTRIL) 40 MG tablet Take 1 tablet (40 mg total) by mouth daily.  90 tablet  3    Past Medical History  Diagnosis Date  . Hypertension   . Dyslipidemia   . Obesity   . Atrial fibrillation   . OSA (obstructive sleep apnea)     Past Surgical History  Procedure Date  . Total hip arthroplasty 1999    Family History  Problem Relation Age of Onset  . Other Father     deceased- unknow reason  . Hypertension Mother     History   Social History  . Marital Status: Married    Spouse Name: N/A    Number of Children: 1  . Years of Education: N/A   Occupational History  . [employer]    Social History Main Topics  . Smoking status: Current Some Day Smoker    Last Attempt to Quit: 04/04/1967  . Smokeless tobacco: Not on file  . Alcohol Use: Not on file  . Drug Use: Not on file  . Sexually  Active: Not on file   Other Topics Concern  . Not on file   Social History Narrative  . No narrative on file    Review of Systems:  All systems reviewed.  They are negative to the above problem except as previously stated.  Vital Signs: BP 158/99  Pulse 67  Ht 6\' 1"  (1.854 m)  Wt 312 lb (141.522 kg)  BMI 41.16 kg/m2  Physical Exam  Patient is in NAD HEENT:  Normocephalic, atraumatic. EOMI, PERRLA.  Neck: JVP is normal. No thyromegaly. No bruits.  Lungs: clear to auscultation. No rales no wheezes.  Heart: Regular rate and rhythm. Normal S1, S2. No S3.   No significant murmurs. PMI not displaced.  Abdomen:  Supple, nontender. Normal bowel sounds. No masses. No hepatomegaly.  Extremities:   Good distal pulses throughout. No lower extremity edema.  Musculoskeletal :moving all extremities.  Neuro:   alert and oriented x3.  CN II-XII grossly intact.  EKG:  NSR.  67   LVH.  T wave inversion consistent with strain pattern, cannot exclude  ischemia.  Assessment and Plan:

## 2011-01-03 LAB — CBC WITH DIFFERENTIAL/PLATELET
Basophils Absolute: 0 10*3/uL (ref 0.0–0.1)
Basophils Relative: 0.3 % (ref 0.0–3.0)
Eosinophils Absolute: 0.2 10*3/uL (ref 0.0–0.7)
Lymphocytes Relative: 25.8 % (ref 12.0–46.0)
MCHC: 33.2 g/dL (ref 30.0–36.0)
MCV: 93 fl (ref 78.0–100.0)
Monocytes Absolute: 0.8 10*3/uL (ref 0.1–1.0)
Neutro Abs: 3.1 10*3/uL (ref 1.4–7.7)
Neutrophils Relative %: 56.2 % (ref 43.0–77.0)
RDW: 15.5 % — ABNORMAL HIGH (ref 11.5–14.6)

## 2011-01-03 LAB — BASIC METABOLIC PANEL
BUN: 14 mg/dL (ref 6–23)
CO2: 30 mEq/L (ref 19–32)
Calcium: 8.7 mg/dL (ref 8.4–10.5)
Creatinine, Ser: 1.2 mg/dL (ref 0.4–1.5)
GFR: 82.82 mL/min (ref >60.00)
Glucose, Bld: 113 mg/dL — ABNORMAL HIGH (ref 70–99)

## 2011-01-03 LAB — LIPID PANEL
LDL Cholesterol: 79 mg/dL (ref 0–99)
VLDL: 17.4 mg/dL (ref 0.0–40.0)

## 2011-01-08 NOTE — Assessment & Plan Note (Signed)
No symptoms to suggest recurrence.  Continue regimen.

## 2011-01-08 NOTE — Assessment & Plan Note (Signed)
Down to 3 cigs per day.  Encouraged continued cutting back.

## 2011-01-08 NOTE — Assessment & Plan Note (Addendum)
Keep on current meds.  Check lipids.

## 2011-01-08 NOTE — Assessment & Plan Note (Signed)
Continue CPAP.  

## 2011-01-08 NOTE — Assessment & Plan Note (Signed)
BP is very difficult to control. Renal USN was without stenosis.   Says he is taking meds.  Using CPAP   WIll review previous studies.  Question if he is a candidate for RF to renal arteries.

## 2011-01-13 ENCOUNTER — Telehealth: Payer: Self-pay | Admitting: Internal Medicine

## 2011-01-13 DIAGNOSIS — E876 Hypokalemia: Secondary | ICD-10-CM

## 2011-01-13 MED ORDER — POTASSIUM CHLORIDE CRYS ER 20 MEQ PO TBCR: EXTENDED_RELEASE_TABLET | ORAL | Status: DC

## 2011-01-13 NOTE — Telephone Encounter (Signed)
Called patient with lab results. See result note.  

## 2011-01-13 NOTE — Telephone Encounter (Signed)
Pt returning call to [place]. Please call back.

## 2011-01-23 ENCOUNTER — Ambulatory Visit (INDEPENDENT_AMBULATORY_CARE_PROVIDER_SITE_OTHER): Payer: No Typology Code available for payment source | Admitting: *Deleted

## 2011-01-23 DIAGNOSIS — Z7901 Long term (current) use of anticoagulants: Secondary | ICD-10-CM

## 2011-01-23 DIAGNOSIS — I4891 Unspecified atrial fibrillation: Secondary | ICD-10-CM

## 2011-01-30 ENCOUNTER — Other Ambulatory Visit (INDEPENDENT_AMBULATORY_CARE_PROVIDER_SITE_OTHER): Payer: No Typology Code available for payment source | Admitting: *Deleted

## 2011-01-30 DIAGNOSIS — E876 Hypokalemia: Secondary | ICD-10-CM

## 2011-01-31 LAB — BASIC METABOLIC PANEL
BUN: 16 mg/dL (ref 6–23)
Calcium: 8.8 mg/dL (ref 8.4–10.5)
GFR: 93.92 mL/min (ref >60.00)
Glucose, Bld: 118 mg/dL — ABNORMAL HIGH (ref 70–99)
Sodium: 140 mEq/L (ref 135–145)

## 2011-02-14 ENCOUNTER — Ambulatory Visit (INDEPENDENT_AMBULATORY_CARE_PROVIDER_SITE_OTHER): Payer: No Typology Code available for payment source | Admitting: *Deleted

## 2011-02-14 DIAGNOSIS — I4891 Unspecified atrial fibrillation: Secondary | ICD-10-CM

## 2011-02-14 DIAGNOSIS — Z7901 Long term (current) use of anticoagulants: Secondary | ICD-10-CM

## 2011-02-27 ENCOUNTER — Ambulatory Visit (INDEPENDENT_AMBULATORY_CARE_PROVIDER_SITE_OTHER): Payer: No Typology Code available for payment source | Admitting: *Deleted

## 2011-02-27 DIAGNOSIS — I4891 Unspecified atrial fibrillation: Secondary | ICD-10-CM

## 2011-03-07 ENCOUNTER — Telehealth: Payer: Self-pay | Admitting: Internal Medicine

## 2011-03-07 DIAGNOSIS — E876 Hypokalemia: Secondary | ICD-10-CM

## 2011-03-07 NOTE — Telephone Encounter (Signed)
Message copied by Waymon Budge on Tue Mar 07, 2011 10:33 AM ------      Message from: Pricilla Riffle      Created: Tue Mar 07, 2011 10:24 AM       Patient needs repeat BMET, Mg, Aldosterone level.      May need medicine adjustment.

## 2011-03-13 ENCOUNTER — Other Ambulatory Visit: Payer: Self-pay | Admitting: *Deleted

## 2011-03-13 ENCOUNTER — Other Ambulatory Visit (INDEPENDENT_AMBULATORY_CARE_PROVIDER_SITE_OTHER): Payer: No Typology Code available for payment source | Admitting: *Deleted

## 2011-03-13 DIAGNOSIS — E876 Hypokalemia: Secondary | ICD-10-CM

## 2011-03-13 MED ORDER — WARFARIN SODIUM 5 MG PO TABS: 5.0000 mg | ORAL_TABLET | ORAL | Status: DC

## 2011-03-14 ENCOUNTER — Other Ambulatory Visit: Payer: Self-pay | Admitting: Pharmacist

## 2011-03-14 LAB — BASIC METABOLIC PANEL
BUN: 15 mg/dL (ref 6–23)
CO2: 29 mEq/L (ref 19–32)
Chloride: 107 mEq/L (ref 96–112)
Creatinine, Ser: 1.1 mg/dL (ref 0.4–1.5)
Potassium: 3.7 mEq/L (ref 3.5–5.1)

## 2011-03-14 LAB — MAGNESIUM: Magnesium: 2.1 mg/dL (ref 1.5–2.5)

## 2011-03-14 MED ORDER — WARFARIN SODIUM 5 MG PO TABS: ORAL_TABLET | ORAL | Status: DC

## 2011-03-17 ENCOUNTER — Telehealth: Payer: Self-pay | Admitting: Internal Medicine

## 2011-03-17 NOTE — Telephone Encounter (Signed)
Called patient and left message with his wife concerning lab results.

## 2011-03-17 NOTE — Telephone Encounter (Signed)
FU Call: Pt returning call from our office. Please call back.  

## 2011-03-20 LAB — ALDOSTERONE + RENIN ACTIVITY W/ RATIO: Aldosterone: 17 ng/dL

## 2011-03-29 ENCOUNTER — Ambulatory Visit (INDEPENDENT_AMBULATORY_CARE_PROVIDER_SITE_OTHER): Payer: No Typology Code available for payment source | Admitting: *Deleted

## 2011-03-29 DIAGNOSIS — I4891 Unspecified atrial fibrillation: Secondary | ICD-10-CM

## 2011-03-29 DIAGNOSIS — Z7901 Long term (current) use of anticoagulants: Secondary | ICD-10-CM

## 2011-03-29 LAB — POCT INR: INR: 2.3

## 2011-04-26 ENCOUNTER — Ambulatory Visit (INDEPENDENT_AMBULATORY_CARE_PROVIDER_SITE_OTHER): Payer: No Typology Code available for payment source | Admitting: *Deleted

## 2011-04-26 DIAGNOSIS — I4891 Unspecified atrial fibrillation: Secondary | ICD-10-CM

## 2011-04-26 DIAGNOSIS — Z7901 Long term (current) use of anticoagulants: Secondary | ICD-10-CM

## 2011-04-26 LAB — POCT INR: INR: 2.7

## 2011-05-10 ENCOUNTER — Encounter: Payer: Self-pay | Admitting: Internal Medicine

## 2011-06-07 ENCOUNTER — Ambulatory Visit (INDEPENDENT_AMBULATORY_CARE_PROVIDER_SITE_OTHER): Payer: No Typology Code available for payment source | Admitting: *Deleted

## 2011-06-07 DIAGNOSIS — I4891 Unspecified atrial fibrillation: Secondary | ICD-10-CM

## 2011-06-07 DIAGNOSIS — Z7901 Long term (current) use of anticoagulants: Secondary | ICD-10-CM

## 2011-06-14 ENCOUNTER — Encounter: Payer: Self-pay | Admitting: Internal Medicine

## 2011-06-22 ENCOUNTER — Telehealth: Payer: Self-pay | Admitting: Internal Medicine

## 2011-06-22 NOTE — Telephone Encounter (Signed)
[facility] [phone] Valinda Hoar -[phone], UPDATED RX FOR CPAP, PT CHANGING COMPANY'S

## 2011-06-22 NOTE — Telephone Encounter (Signed)
Patient states uses a CPAP machine, he would like to change providers ,so the new provider requires a new update prescription for settings and supplies. The company is [facility] in [place] phone # [phone]/ [phone].

## 2011-06-23 ENCOUNTER — Telehealth: Payer: Self-pay | Admitting: Internal Medicine

## 2011-06-23 NOTE — Telephone Encounter (Signed)
Not sure what to do.

## 2011-06-23 NOTE — Telephone Encounter (Signed)
Called patient and advised that he might have to see Dr.Clance again for CPAP orders. Last seen 12/20/2009. Advised will ask Dr.Ross and call him back.

## 2011-06-23 NOTE — Telephone Encounter (Signed)
Error

## 2011-06-23 NOTE — Telephone Encounter (Signed)
LMOM for call back. 

## 2011-06-25 NOTE — Telephone Encounter (Signed)
Should see K Clance since I cannot optimize if he needs.

## 2011-06-28 NOTE — Telephone Encounter (Signed)
LMOM that Dr.Ross has advised that he make another appointment to see Dr.Clance to update his CPAP needs and orders.

## 2011-07-03 ENCOUNTER — Other Ambulatory Visit: Payer: Self-pay | Admitting: *Deleted

## 2011-07-03 ENCOUNTER — Other Ambulatory Visit: Payer: Self-pay | Admitting: Internal Medicine

## 2011-07-15 ENCOUNTER — Other Ambulatory Visit: Payer: Self-pay | Admitting: Internal Medicine

## 2011-07-19 ENCOUNTER — Ambulatory Visit (INDEPENDENT_AMBULATORY_CARE_PROVIDER_SITE_OTHER): Payer: No Typology Code available for payment source | Admitting: *Deleted

## 2011-07-19 DIAGNOSIS — I4891 Unspecified atrial fibrillation: Secondary | ICD-10-CM

## 2011-07-19 DIAGNOSIS — Z7901 Long term (current) use of anticoagulants: Secondary | ICD-10-CM

## 2011-08-01 ENCOUNTER — Other Ambulatory Visit: Payer: Self-pay | Admitting: Internal Medicine

## 2011-08-02 ENCOUNTER — Other Ambulatory Visit: Payer: Self-pay | Admitting: *Deleted

## 2011-08-09 ENCOUNTER — Other Ambulatory Visit: Payer: Self-pay | Admitting: Internal Medicine

## 2011-08-11 ENCOUNTER — Ambulatory Visit (INDEPENDENT_AMBULATORY_CARE_PROVIDER_SITE_OTHER): Payer: No Typology Code available for payment source | Admitting: *Deleted

## 2011-08-11 DIAGNOSIS — I4891 Unspecified atrial fibrillation: Secondary | ICD-10-CM

## 2011-08-11 DIAGNOSIS — Z7901 Long term (current) use of anticoagulants: Secondary | ICD-10-CM

## 2011-08-11 LAB — POCT INR: INR: 4.8

## 2011-08-25 ENCOUNTER — Ambulatory Visit (INDEPENDENT_AMBULATORY_CARE_PROVIDER_SITE_OTHER): Payer: No Typology Code available for payment source | Admitting: Pharmacist

## 2011-08-25 DIAGNOSIS — Z7901 Long term (current) use of anticoagulants: Secondary | ICD-10-CM

## 2011-08-25 DIAGNOSIS — I4891 Unspecified atrial fibrillation: Secondary | ICD-10-CM

## 2011-08-25 LAB — POCT INR: INR: 2.3

## 2011-09-12 ENCOUNTER — Encounter: Payer: Self-pay | Admitting: Pulmonary Disease

## 2011-09-12 ENCOUNTER — Ambulatory Visit (INDEPENDENT_AMBULATORY_CARE_PROVIDER_SITE_OTHER): Payer: No Typology Code available for payment source | Admitting: Pulmonary Disease

## 2011-09-12 VITALS — BP 132/88 | HR 68 | Temp 98.1°F | Ht 73.0 in | Wt 317.2 lb

## 2011-09-12 DIAGNOSIS — G4733 Obstructive sleep apnea (adult) (pediatric): Secondary | ICD-10-CM

## 2011-09-12 NOTE — Progress Notes (Signed)
  Subjective:    Patient ID: Jason Russell, male    DOB: [birth year], 61 y.o.   MRN: [id number]  HPI The patient comes in today for followup of his obstructive sleep apnea.  He was diagnosed with moderate sleep apnea in 2011, and started on CPAP at that time.  He was asked to followup in 5 weeks, but has not been seen since.  His CPAP machine was started on 10 cm of water, but his optimal pressure was 17.  He has gained significant weight since that time.  He has been wearing CPAP compliantly, and has been keeping up with mask changes and supplies.  He feels that he sleeps fairly well, and is satisfied with his alertness during the day.  Unfortunately, he was given a new machine that was a very old model off the shelf.   Review of Systems  Constitutional: Negative.  Negative for fever and unexpected weight change.  HENT: Negative.  Negative for ear pain, nosebleeds, congestion, sore throat, rhinorrhea, sneezing, trouble swallowing, dental problem, postnasal drip and sinus pressure.   Eyes: Negative.  Negative for redness and itching.  Respiratory: Negative.  Negative for cough, chest tightness, shortness of breath and wheezing.   Cardiovascular: Positive for leg swelling. Negative for palpitations.  Gastrointestinal: Negative.  Negative for nausea and vomiting.  Genitourinary: Negative.  Negative for dysuria.  Musculoskeletal: Negative.  Negative for joint swelling.  Skin: Negative.  Negative for rash.  Neurological: Negative.  Negative for headaches.  Hematological: Negative.  Does not bruise/bleed easily.  Psychiatric/Behavioral: Negative.  Negative for dysphoric mood. The patient is not nervous/anxious.        Objective:   Physical Exam Obese male in no acute distress No skin breakdown or pressure necrosis from the CPAP mask Lower extremities with mild edema, no cyanosis Alert and oriented, moves all 4 extremities.       Assessment & Plan:

## 2011-09-12 NOTE — Assessment & Plan Note (Signed)
The patient has a history of moderate obstructive sleep apnea, and has at least been wearing CPAP compliantly although at a suboptimal pressure.  He feels that it has helped his sleep considerably, and is much more rested in the mornings when he arises.  He feels that his daytime alertness is adequate.  At this point, I would like to increase the pressure on his CPAP machine, and I have strongly encouraged him to work aggressively on weight loss.  I would like to see him back in 6 months to check on his progress, and then we'll spread out to every year.

## 2011-09-12 NOTE — Patient Instructions (Signed)
Will have your cpap pressure increased to 15cm. Will have your dme show you how to use the ramp button Work on weight loss followup with me in 6 mos.

## 2011-09-22 ENCOUNTER — Ambulatory Visit (INDEPENDENT_AMBULATORY_CARE_PROVIDER_SITE_OTHER): Payer: No Typology Code available for payment source | Admitting: Pharmacist

## 2011-09-22 DIAGNOSIS — I4891 Unspecified atrial fibrillation: Secondary | ICD-10-CM

## 2011-09-22 DIAGNOSIS — Z7901 Long term (current) use of anticoagulants: Secondary | ICD-10-CM

## 2011-10-12 ENCOUNTER — Other Ambulatory Visit: Payer: Self-pay | Admitting: *Deleted

## 2011-10-12 MED ORDER — SIMVASTATIN 40 MG PO TABS: 40.0000 mg | ORAL_TABLET | Freq: Every day | ORAL | Status: DC

## 2011-10-12 NOTE — Telephone Encounter (Signed)
Refilled simvastatin.

## 2011-10-13 ENCOUNTER — Ambulatory Visit (INDEPENDENT_AMBULATORY_CARE_PROVIDER_SITE_OTHER): Payer: No Typology Code available for payment source | Admitting: Pharmacist

## 2011-10-13 DIAGNOSIS — I4891 Unspecified atrial fibrillation: Secondary | ICD-10-CM

## 2011-10-13 DIAGNOSIS — Z7901 Long term (current) use of anticoagulants: Secondary | ICD-10-CM

## 2011-10-25 ENCOUNTER — Other Ambulatory Visit: Payer: Self-pay | Admitting: Internal Medicine

## 2011-11-01 ENCOUNTER — Inpatient Hospital Stay (HOSPITAL_COMMUNITY)
Admission: EM | Admit: 2011-11-01 | Discharge: 2011-11-07 | DRG: 308 | Disposition: A | Discharge status: Home / Self Care | Payer: No Typology Code available for payment source | Attending: Internal Medicine | Admitting: Internal Medicine

## 2011-11-01 ENCOUNTER — Emergency Department (HOSPITAL_COMMUNITY): Payer: No Typology Code available for payment source

## 2011-11-01 ENCOUNTER — Encounter (HOSPITAL_COMMUNITY): Payer: Self-pay | Admitting: Emergency Medicine

## 2011-11-01 DIAGNOSIS — I359 Nonrheumatic aortic valve disorder, unspecified: Secondary | ICD-10-CM

## 2011-11-01 DIAGNOSIS — R7402 Elevation of levels of lactic acid dehydrogenase (LDH): Secondary | ICD-10-CM | POA: Diagnosis present

## 2011-11-01 DIAGNOSIS — J4489 Other specified chronic obstructive pulmonary disease: Secondary | ICD-10-CM | POA: Diagnosis present

## 2011-11-01 DIAGNOSIS — E785 Hyperlipidemia, unspecified: Secondary | ICD-10-CM | POA: Diagnosis present

## 2011-11-01 DIAGNOSIS — I509 Heart failure, unspecified: Secondary | ICD-10-CM | POA: Diagnosis present

## 2011-11-01 DIAGNOSIS — R7401 Elevation of levels of liver transaminase levels: Secondary | ICD-10-CM | POA: Diagnosis present

## 2011-11-01 DIAGNOSIS — G4730 Sleep apnea, unspecified: Secondary | ICD-10-CM | POA: Diagnosis present

## 2011-11-01 DIAGNOSIS — Z6838 Body mass index (BMI) 38.0-38.9, adult: Secondary | ICD-10-CM

## 2011-11-01 DIAGNOSIS — Z7901 Long term (current) use of anticoagulants: Secondary | ICD-10-CM | POA: Insufficient documentation

## 2011-11-01 DIAGNOSIS — R0609 Other forms of dyspnea: Secondary | ICD-10-CM | POA: Insufficient documentation

## 2011-11-01 DIAGNOSIS — G4733 Obstructive sleep apnea (adult) (pediatric): Secondary | ICD-10-CM | POA: Diagnosis present

## 2011-11-01 DIAGNOSIS — E663 Overweight: Secondary | ICD-10-CM | POA: Diagnosis present

## 2011-11-01 DIAGNOSIS — I4891 Unspecified atrial fibrillation: Principal | ICD-10-CM | POA: Diagnosis present

## 2011-11-01 DIAGNOSIS — R7309 Other abnormal glucose: Secondary | ICD-10-CM | POA: Diagnosis present

## 2011-11-01 DIAGNOSIS — I5023 Acute on chronic systolic (congestive) heart failure: Secondary | ICD-10-CM | POA: Diagnosis present

## 2011-11-01 DIAGNOSIS — I10 Essential (primary) hypertension: Secondary | ICD-10-CM | POA: Diagnosis present

## 2011-11-01 DIAGNOSIS — J449 Chronic obstructive pulmonary disease, unspecified: Secondary | ICD-10-CM | POA: Diagnosis present

## 2011-11-01 DIAGNOSIS — E669 Obesity, unspecified: Secondary | ICD-10-CM | POA: Diagnosis present

## 2011-11-01 DIAGNOSIS — E876 Hypokalemia: Secondary | ICD-10-CM | POA: Diagnosis present

## 2011-11-01 DIAGNOSIS — F17200 Nicotine dependence, unspecified, uncomplicated: Secondary | ICD-10-CM

## 2011-11-01 DIAGNOSIS — IMO0001 Reserved for inherently not codable concepts without codable children: Secondary | ICD-10-CM | POA: Diagnosis present

## 2011-11-01 DIAGNOSIS — I5022 Chronic systolic (congestive) heart failure: Secondary | ICD-10-CM | POA: Diagnosis present

## 2011-11-01 HISTORY — DX: Chronic obstructive pulmonary disease, unspecified: J44.9

## 2011-11-01 HISTORY — DX: Unspecified asthma, uncomplicated: J45.909

## 2011-11-01 HISTORY — DX: Tobacco use: Z72.0

## 2011-11-01 LAB — CARDIAC PANEL(CRET KIN+CKTOT+MB+TROPI)
CK, MB: 4.5 ng/mL — ABNORMAL HIGH (ref 0.3–4.0)
CK, MB: 4.3 ng/mL — ABNORMAL HIGH (ref 0.3–4.0)
Total CK: 316 U/L — ABNORMAL HIGH (ref 7–232)
Troponin I: <0.3 ng/mL (ref <0.30)

## 2011-11-01 LAB — CBC WITH DIFFERENTIAL/PLATELET
Basophils Absolute: 0 10*3/uL (ref 0.0–0.1)
Basophils Relative: 0 % (ref 0–1)
Eosinophils Absolute: 0 10*3/uL (ref 0.0–0.7)
Eosinophils Relative: 1 % (ref 0–5)
HCT: 39.6 % (ref 39.0–52.0)
MCHC: 33.6 g/dL (ref 30.0–36.0)
MCV: 90.6 fL (ref 78.0–100.0)
Monocytes Absolute: 0.9 10*3/uL (ref 0.1–1.0)
RDW: 15 % (ref 11.5–15.5)

## 2011-11-01 LAB — HEMOGLOBIN A1C: Hgb A1c MFr Bld: 5.9 % — ABNORMAL HIGH (ref <5.7)

## 2011-11-01 LAB — BASIC METABOLIC PANEL
CO2: 24 mEq/L (ref 19–32)
Calcium: 9.7 mg/dL (ref 8.4–10.5)
Creatinine, Ser: 1.29 mg/dL (ref 0.50–1.35)
GFR calc Af Amer: 68 mL/min — ABNORMAL LOW (ref >90)

## 2011-11-01 LAB — POCT I-STAT TROPONIN I: Troponin i, poc: 0.02 ng/mL (ref 0.00–0.08)

## 2011-11-01 MED ORDER — SIMVASTATIN 40 MG PO TABS
40.0000 mg | ORAL_TABLET | Freq: Every day | ORAL | Status: DC
  Filled 2011-11-01: qty 1

## 2011-11-01 MED ORDER — HYDRALAZINE HCL 50 MG PO TABS
50.0000 mg | ORAL_TABLET | Freq: Three times a day (TID) | ORAL | Status: DC
  Administered 2011-11-01 – 2011-11-07 (×18): 50 mg via ORAL
  Filled 2011-11-01 (×22): qty 1

## 2011-11-01 MED ORDER — LISINOPRIL 40 MG PO TABS
40.0000 mg | ORAL_TABLET | Freq: Every day | ORAL | Status: DC
  Administered 2011-11-01 – 2011-11-07 (×7): 40 mg via ORAL
  Filled 2011-11-01: qty 1
  Filled 2011-11-01: qty 2
  Filled 2011-11-01 (×5): qty 1

## 2011-11-01 MED ORDER — IPRATROPIUM BROMIDE 0.02 % IN SOLN: 0.5000 mg | Freq: Four times a day (QID) | RESPIRATORY_TRACT | Status: DC | PRN

## 2011-11-01 MED ORDER — POTASSIUM CHLORIDE CRYS ER 20 MEQ PO TBCR: 20.0000 meq | EXTENDED_RELEASE_TABLET | Freq: Two times a day (BID) | ORAL | Status: DC

## 2011-11-01 MED ORDER — METOPROLOL TARTRATE 50 MG PO TABS
150.0000 mg | ORAL_TABLET | Freq: Two times a day (BID) | ORAL | Status: DC
  Administered 2011-11-01 – 2011-11-07 (×13): 150 mg via ORAL
  Filled 2011-11-01 (×10): qty 1
  Filled 2011-11-01: qty 6
  Filled 2011-11-01 (×3): qty 1

## 2011-11-01 MED ORDER — ACETAMINOPHEN 325 MG PO TABS: 650.0000 mg | ORAL_TABLET | ORAL | Status: DC | PRN

## 2011-11-01 MED ORDER — POTASSIUM CHLORIDE CRYS ER 20 MEQ PO TBCR
20.0000 meq | EXTENDED_RELEASE_TABLET | Freq: Every day | ORAL | Status: DC
  Administered 2011-11-01 – 2011-11-02 (×2): 20 meq via ORAL
  Filled 2011-11-01 (×4): qty 1

## 2011-11-01 MED ORDER — NITROGLYCERIN 0.4 MG SL SUBL: 0.4000 mg | SUBLINGUAL_TABLET | SUBLINGUAL | Status: DC | PRN

## 2011-11-01 MED ORDER — DILTIAZEM HCL 100 MG IV SOLR
5.0000 mg/h | Freq: Once | INTRAVENOUS | Status: AC
  Administered 2011-11-01: 5 mg/h via INTRAVENOUS

## 2011-11-01 MED ORDER — WARFARIN - PHARMACIST DOSING INPATIENT: Freq: Every day | Status: DC

## 2011-11-01 MED ORDER — NIACIN ER 500 MG PO CPCR
1000.0000 mg | ORAL_CAPSULE | Freq: Every day | ORAL | Status: DC
  Administered 2011-11-01 – 2011-11-06 (×6): 1000 mg via ORAL
  Filled 2011-11-01 (×7): qty 2

## 2011-11-01 MED ORDER — SODIUM CHLORIDE 0.9 % IV SOLN: 250.0000 mL | INTRAVENOUS | Status: DC | PRN

## 2011-11-01 MED ORDER — SODIUM CHLORIDE 0.9 % IJ SOLN
3.0000 mL | Freq: Two times a day (BID) | INTRAMUSCULAR | Status: DC
  Administered 2011-11-01 – 2011-11-06 (×11): 3 mL via INTRAVENOUS

## 2011-11-01 MED ORDER — ALBUTEROL (5 MG/ML) CONTINUOUS INHALATION SOLN
10.0000 mg/h | INHALATION_SOLUTION | RESPIRATORY_TRACT | Status: AC
  Administered 2011-11-01: 10 mg/h via RESPIRATORY_TRACT
  Filled 2011-11-01: qty 20

## 2011-11-01 MED ORDER — IPRATROPIUM BROMIDE 0.02 % IN SOLN
0.5000 mg | Freq: Four times a day (QID) | RESPIRATORY_TRACT | Status: DC
  Administered 2011-11-01: 0.5 mg via RESPIRATORY_TRACT
  Filled 2011-11-01: qty 2.5

## 2011-11-01 MED ORDER — POTASSIUM CHLORIDE CRYS ER 20 MEQ PO TBCR
40.0000 meq | EXTENDED_RELEASE_TABLET | Freq: Every day | ORAL | Status: DC
  Administered 2011-11-01 – 2011-11-03 (×3): 40 meq via ORAL
  Filled 2011-11-01: qty 1
  Filled 2011-11-01 (×2): qty 2

## 2011-11-01 MED ORDER — ALPRAZOLAM 0.25 MG PO TABS: 0.2500 mg | ORAL_TABLET | Freq: Two times a day (BID) | ORAL | Status: DC | PRN

## 2011-11-01 MED ORDER — ZOLPIDEM TARTRATE 5 MG PO TABS: 5.0000 mg | ORAL_TABLET | Freq: Every evening | ORAL | Status: DC | PRN

## 2011-11-01 MED ORDER — BUPROPION HCL ER (SR) 150 MG PO TB12
150.0000 mg | ORAL_TABLET | Freq: Two times a day (BID) | ORAL | Status: DC
  Administered 2011-11-01 – 2011-11-07 (×12): 150 mg via ORAL
  Filled 2011-11-01 (×14): qty 1

## 2011-11-01 MED ORDER — ONDANSETRON HCL 4 MG/2ML IJ SOLN: 4.0000 mg | Freq: Four times a day (QID) | INTRAMUSCULAR | Status: DC | PRN

## 2011-11-01 MED ORDER — ATORVASTATIN CALCIUM 20 MG PO TABS
20.0000 mg | ORAL_TABLET | Freq: Every day | ORAL | Status: DC
  Administered 2011-11-01 – 2011-11-06 (×6): 20 mg via ORAL
  Filled 2011-11-01 (×7): qty 1

## 2011-11-01 MED ORDER — DILTIAZEM HCL 25 MG/5ML IV SOLN
20.0000 mg | Freq: Once | INTRAVENOUS | Status: AC
  Administered 2011-11-01: 20 mg via INTRAVENOUS
  Filled 2011-11-01: qty 5

## 2011-11-01 MED ORDER — NIACIN ER (ANTIHYPERLIPIDEMIC) 1000 MG PO TBCR: 1000.0000 mg | EXTENDED_RELEASE_TABLET | Freq: Every day | ORAL | Status: DC

## 2011-11-01 MED ORDER — DILTIAZEM HCL 100 MG IV SOLR
5.0000 mg/h | INTRAVENOUS | Status: DC
  Administered 2011-11-01 (×2): 15 mg/h via INTRAVENOUS
  Administered 2011-11-02 (×2): 20 mg/h via INTRAVENOUS
  Administered 2011-11-02: 15 mg/h via INTRAVENOUS
  Administered 2011-11-02: 20 mg/h via INTRAVENOUS
  Administered 2011-11-03: 15 mg/h via INTRAVENOUS
  Administered 2011-11-03: 20 mg/h via INTRAVENOUS
  Filled 2011-11-01: qty 100

## 2011-11-01 MED ORDER — SODIUM CHLORIDE 0.9 % IJ SOLN: 3.0000 mL | INTRAMUSCULAR | Status: DC | PRN

## 2011-11-01 MED ORDER — LEVALBUTEROL HCL 1.25 MG/0.5ML IN NEBU
1.2500 mg | INHALATION_SOLUTION | Freq: Four times a day (QID) | RESPIRATORY_TRACT | Status: DC
  Administered 2011-11-01: 1.25 mg via RESPIRATORY_TRACT
  Filled 2011-11-01 (×3): qty 0.5

## 2011-11-01 MED ORDER — ALLOPURINOL 300 MG PO TABS
300.0000 mg | ORAL_TABLET | Freq: Two times a day (BID) | ORAL | Status: DC
  Administered 2011-11-01 – 2011-11-07 (×12): 300 mg via ORAL
  Filled 2011-11-01 (×14): qty 1

## 2011-11-01 MED ORDER — FUROSEMIDE 10 MG/ML IJ SOLN
40.0000 mg | Freq: Once | INTRAMUSCULAR | Status: AC
  Administered 2011-11-01: 40 mg via INTRAVENOUS
  Filled 2011-11-01: qty 4

## 2011-11-01 MED ORDER — LEVALBUTEROL HCL 1.25 MG/0.5ML IN NEBU
1.2500 mg | INHALATION_SOLUTION | Freq: Four times a day (QID) | RESPIRATORY_TRACT | Status: DC | PRN
  Filled 2011-11-01: qty 0.5

## 2011-11-01 NOTE — Care Management Note (Addendum)
    Page 1 of 1   11/03/2011     11:33:12 AM   CARE MANAGEMENT NOTE 11/03/2011  Patient:  KAELIN, BONELLI   Account Number:  [id number]  Date Initiated:  11/01/2011  Documentation initiated by:  Junius Creamer  Subjective/Objective Assessment:   adm w at fib w rvr     Action/Plan:   lives w wife, pcp dr Nolon Nations   Anticipated DC Date:     Anticipated DC Plan:        DC Planning Services  CM consult      Choice offered to / List presented to:             Status of service:   Medicare Important Message given?   (If response is "NO", the following Medicare IM given date fields will be blank) Date Medicare IM given:   Date Additional Medicare IM given:    Discharge Disposition:    Per UR Regulation:  Reviewed for med. necessity/level of care/duration of stay  If discussed at Long Length of Stay Meetings, dates discussed:    Comments:  8/2 11:31a debbie Daionna Crossland rn,bsn pt would have approx 67.19 copay for 30day supply of tikosyn and prior auth needed at [phone].  7/31 14:00 debbie Matrice Herro rn,bsn [phone]

## 2011-11-01 NOTE — Progress Notes (Signed)
ANTICOAGULATION CONSULT NOTE - Initial Consult  Pharmacy Consult for Warfarin Indication: atrial fibrillation  No Known Allergies  Patient Measurements:   Heparin Dosing Weight: n/a  Vital Signs: Temp: 98.6 F (37 C) (07/31 0450) Temp src: Oral (07/31 0450) BP: 149/93 mmHg (07/31 0900) Pulse Rate: 117  (07/31 0835)  Labs:  Basename 11/01/11 0458  HGB 13.3  HCT 39.6  PLT 172  APTT --  LABPROT 49.1*  INR 5.27*  HEPARINUNFRC --  CREATININE 1.29  CKTOTAL --  CKMB --  TROPONINI --    The CrCl is unknown because both a height and weight (above a minimum accepted value) are required for this calculation.   Medical History: Past Medical History  Diagnosis Date  . Hypertension   . Dyslipidemia   . Obesity   . Atrial fibrillation   . OSA (obstructive sleep apnea)   . Tobacco abuse     Medications:  Scheduled:    . allopurinol  300 mg Oral BID  . buPROPion  150 mg Oral BID  . diltiazem (CARDIZEM) infusion  5-15 mg/hr Intravenous Once  . diltiazem  20 mg Intravenous Once  . hydrALAZINE  50 mg Oral Q8H  . ipratropium  0.5 mg Nebulization Q6H  . levalbuterol  1.25 mg Nebulization Q6H  . lisinopril  40 mg Oral Daily  . metoprolol  150 mg Oral BID  . niacin  1,000 mg Oral Daily  . simvastatin  40 mg Oral QHS  . DISCONTD: potassium chloride SA  20-40 mEq Oral BID    Assessment: 61 yo male on chronic anticoagulation for afib admitted with afib with RVR.  Pharmacy asked to dose Coumadin.  Home dose of Coumadin is 7.5 mg daily except 5 mg on Tuesdays and Saturdays.  INR elevated today at admission (5.27); no bleeding noted.    Of note, INR was 3.1 in Coumadin clinic on 7/12, and dose stated as 7.5 mg daily except 5 mg on Tues, Thurs, and Sat.  In speaking with patient, he tells me he only takes 5 mg on Tues and Saturdays.   Goal of Therapy:  INR 2-3 Monitor platelets by anticoagulation protocol: Yes   Plan:  1. No Coumadin tonight. 2. F/U AM INR. 3. When INR  is nearing therapeutic range, continue re-initiation of Coumadin at 7.5 mg daily except 5 on Tu, TH, and Sat. 4. Pt says he has a f/u appt at [facility] clinic on 8/9.  Annelle Behrendt C 11/01/2011,9:35 AM

## 2011-11-01 NOTE — Progress Notes (Signed)
  Echocardiogram 2D Echocardiogram has been performed.  Loye Reininger FRANCES 11/01/2011, 5:57 PM

## 2011-11-01 NOTE — ED Notes (Signed)
61 year old male with a history of paroxysmal atrial fibrillation status post electrical cardioversion as well as hypertension, COPD. 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 The shortness of breath has been persistent, severe at times, improved with supplemental oxygen. He has difficulty with ambulation secondary to his exertional dyspnea. He also does mild swelling of the legs though this is a chronic problem for him.  Cardiologist, Dr. Tenny Craw  Physical exam: Patient in no acute distress, mild tachypnea, speaks in full sentences, decreased lung sounds bilaterally. Pulses are palpable at the radial arteries bilaterally, normal capillary refill of the fingertips, no JVD. Abdomen is soft, obese, nontender. Heart sounds are irregular, no obvious murmurs auscultated.  EKG shows atrial fibrillation with a rapid ventricular rate with a left axis deviation and nonspecific T waves consistent with prior EKG other than rhythm which is atrial fibrillation replacing normal sinus rhythm.  Assessment:  Atrial fibrillation with rapid ventricular rate, this is a recurrent problem for this gentleman and currently has a blood pressure of 140/110. We'll give Cardizem bolus followed by possible titratable drip for rate control, check coagulation panel, troponin, reevaluate. The patient denies chest pain  EKG shows atrial fibrillation with a rapid ventricular rate, this has improved significantly after receiving intravenous Cardizem bolus followed by a continuous infusion. Currently the pulse is approximately 105 beats per minute, blood pressure slightly elevated and the patient continues to be mildly dyspneic. Review of the laboratory data suggests that he is over coagulated with Coumadin, troponin is normal and his chest x-ray does not reveal any other sources of shortness of breath or dyspnea.  I have discussed his care  with the cardiologist Dr. [place] who agrees to admit the patient.  CRITICAL CARE Performed by: Vida Roller   Total critical care time: 30  Critical care time was exclusive of separately billable procedures and treating other patients.  Critical care was necessary to treat or prevent imminent or life-threatening deterioration.  Critical care was time spent personally by me on the following activities: development of treatment plan with patient and/or surrogate as well as nursing, discussions with consultants, evaluation of patient's response to treatment, examination of patient, obtaining history from patient or surrogate, ordering and performing treatments and interventions, ordering and review of laboratory studies, ordering and review of radiographic studies, pulse oximetry and re-evaluation of patient's condition.   Medical screening examination/treatment/procedure(s) were conducted as a shared visit with non-physician practitioner(s) and myself.  I personally evaluated the patient during the encounter    Vida Roller, MD 11/01/11 [phone]

## 2011-11-01 NOTE — ED Provider Notes (Signed)
Medical screening examination/treatment/procedure(s) were conducted as a shared visit with non-physician practitioner(s) and myself.  I personally evaluated the patient during the encounter  Please see my separate respective documentation pertaining to this patient encounter   Vida Roller, MD 11/01/11 [phone]

## 2011-11-01 NOTE — ED Notes (Signed)
PT. REPORTS SOB ONSET YESTERDAY , DENIES COUGH OR CONGESTION.

## 2011-11-01 NOTE — ED Provider Notes (Signed)
History     CSN: [id number]  Arrival date & time 11/01/11  0417   First MD Initiated Contact with Patient 11/01/11 [phone]      Chief Complaint  Patient presents with  . 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 He reports associated numbness that radiates down his left arm. He has a history of afib. His last known episode was June 2012 for which he was electrically cardioverted. He is anticoagulated on coumadin. He denies any associated chest pain, diaphoresis, nausea, vision changes.   Patient is a 61 y.o. male presenting with shortness of breath.  Shortness of Breath  Associated symptoms include shortness of breath. Pertinent negatives include no chest pain and no cough.    Past Medical History  Diagnosis Date  . Hypertension   . Dyslipidemia   . Obesity   . Atrial fibrillation   . OSA (obstructive sleep apnea)     Past Surgical History  Procedure Date  . Total hip arthroplasty 1999    Family History  Problem Relation Age of Onset  . Other Father     deceased- unknow reason  . Hypertension Mother   . Lung cancer Brother     History  Substance Use Topics  . Smoking status: Current Everyday Smoker -- 0.5 packs/day for 40 years    Types: Cigarettes  . Smokeless tobacco: Not on file  . Alcohol Use: No      Review of Systems  Constitutional: Negative for chills, diaphoresis and fatigue.  HENT: Negative for congestion.   Eyes: Negative for visual disturbance.  Respiratory: Positive for shortness of breath. Negative for cough.   Cardiovascular: Negative for chest pain.  Gastrointestinal: Negative for nausea, vomiting, abdominal pain, diarrhea and abdominal distention.  Musculoskeletal: Negative for back pain.  Skin: Negative for pallor.  Neurological: Negative  for dizziness, syncope, light-headedness and headaches.    Allergies  Review of patient's allergies indicates no known allergies.  Home Medications   Current Outpatient Rx  Name Route Sig Dispense Refill  . ALLOPURINOL 300 MG PO TABS Oral Take 300 mg by mouth 2 (two) times daily.     . BUPROPION HCL ER (SR) 150 MG PO TB12 Oral Take 150 mg by mouth 2 (two) times daily.     [name] CLONIDINE HCL 0.3 MG PO TABS      . HYDRALAZINE HCL 50 MG PO TABS  TAKE 1 TABLET BY MOUTH THREE TIMES A DAY 270 tablet 1  . LISINOPRIL 40 MG PO TABS Oral Take 40 mg by mouth daily.    [name] METOPROLOL TARTRATE 50 MG PO TABS  TAKE 3 TABLETS BY MOUTH TWICE A DAY 540 tablet 3  . NIASPAN 1000 MG PO TBCR Oral Take 1,000 mg by mouth daily. 1 tab daily    . POTASSIUM CHLORIDE CRYS ER 20 MEQ PO TBCR  2 tabs in the AM and 1 tab in the PM 180 tablet 3  . SIMVASTATIN 40 MG PO TABS Oral Take 1 tablet (40 mg total) by mouth at bedtime. 90 tablet 3  . WARFARIN SODIUM 5 MG PO TABS Oral Take 5-7.5 mg by mouth daily. Takes 7.5mg  daily except Tuesday and Saturday, then takes 5mg     . LISINOPRIL 40 MG PO TABS Oral Take 1 tablet (40 mg total) by mouth daily. 90 tablet 3    BP 147/117  Pulse 131  Temp 98.6 F (37 C) (Oral)  Resp 27  SpO2 99%  Physical Exam  Nursing note and vitals reviewed. Constitutional: He appears well-developed and well-nourished.       Patient is tachypnic.    HENT:  Head: Normocephalic and atraumatic.  Mouth/Throat: Oropharyngeal exudate present.  Eyes: Conjunctivae are normal. Pupils are equal, round, and reactive to light. No scleral icterus.  Neck: Normal range of motion. Neck supple.  Cardiovascular: Intact distal pulses.  Exam reveals no gallop and no friction rub.   No murmur heard.      Tachycardic in 120's with irregularly irregular rhythm.   Pulmonary/Chest: He has no wheezes. He has no rales.       Patient is tachypnic in high 20's.   Abdominal: Soft. He exhibits no distension. There is no  tenderness. There is no rebound.  Musculoskeletal: Normal range of motion.  Neurological: He is alert.  Skin: Skin is warm and dry. He is not diaphoretic.  Psychiatric: He has a normal mood and affect. His behavior is normal.    ED Course  Procedures (including critical care time)   Date: 11/01/2011  Rate: 127  Rhythm: atrial fibrillation  QRS Axis: left  Intervals: abnormal intervals  ST/T Wave abnormalities: nonspecific T wave changes  Conduction Disutrbances:none  Narrative Interpretation: afib with RVR  Old EKG Reviewed: changes noted    Labs Reviewed  PROTIME-INR - Abnormal; Notable for the following:    Prothrombin Time 49.1 (*)     All other components within normal limits  BASIC METABOLIC PANEL - Abnormal; Notable for the following:    Glucose, Bld 127 (*)     GFR calc non Af Amer 59 (*)     GFR calc Af Amer 68 (*)     All other components within normal limits  CBC WITH DIFFERENTIAL  POCT I-STAT TROPONIN I   Dg Chest 2 View  11/01/2011  *RADIOLOGY REPORT*  Clinical Data: Shortness of breath, cough.  CHEST - 2 VIEW  Comparison: None.  Findings: Cardiomegaly.  Mediastinal prominence.  Central vascular congestion.  Mild lung base opacities. Peribronchial thickening and mild interstitial prominence.  No pleural effusion or pneumothorax. Multilevel flowing anterior osteophytes.  No acute osseous finding.  IMPRESSION: Prominent cardiomediastinal contours.  Mild acute versus chronic bronchitic changes and lung base atelectasis.  Original Report Authenticated By: Waneta Martins, M.D.     1. Atrial fibrillation with rapid ventricular response       MDM  5:22 AM Patient presents with shortness of breath as a result of A fib with RVR. He will be bolused with Cardizem and rechecked.   5:56 AM After bolus of cardizem, patient's heartrate is in the 90's and rhythm still shows afib. He is still tachypnic and reports feeling a little better.   6:30 AM Patient is still  feeling short of breath. I ordered an albuterol nebulizer to see if that will provide any relief. Cardiology consulted for admission.      Emilia Beck, PA-C 11/01/11 0654  Emilia Beck, PA-C 11/01/11 [phone]

## 2011-11-01 NOTE — Progress Notes (Signed)
DCCV scheduled for 2 pm, 11/02/2011 with Dr Tenny Craw. Jason Russell [name]

## 2011-11-01 NOTE — Consult Note (Signed)
CARDIOLOGY CONSULT NOTE   Patient ID: Jason Russell MRN: [id number] DOB/AGE: [birth year] 61 y.o.  Admit date: 11/01/2011  Primary Physician   Garth Schlatter, MD Primary Cardiologist   PR Reason for Consultation   SOB, afib  Jason Russell:[id number] Jason Russell is a 61 y.o. male with a history of atrial fibrillation. He does not normally get palpitations but can't tell he is in atrial fibrillation because of dyspnea on exertion. On Monday night, follow up work, he noticed some increasing dyspnea on exertion. He was not very symptomatic and did not seek help. Yesterday he took his brother (stage IV cancer) to a treatment and again noted shortness of breath but because of his brother situation, did not think. Last p.m., his shortness of breath worsened and he came to the emergency room. In the emergency room he was felt to have a COPD exacerbation and was in atrial fibrillation with rapid ventricular response. He has never had palpitations. He did not get chest pain. He denies orthopnea or PND. He notes a cough and some wheezing. He has wheezing occasionally has an outpatient but does not take any medicine for it. Currently on O2, his shortness of breath is improved.   Past Medical History  Diagnosis Date  . Hypertension   . Dyslipidemia   . Obesity   . Atrial fibrillation   . OSA (obstructive sleep apnea)      Past Surgical History  Procedure Date  . Total hip arthroplasty 1999    No Known Allergies  I have reviewed the patient's current medications   . diltiazem (CARDIZEM) infusion  5-15 mg/hr Intravenous Once  . diltiazem  20 mg Intravenous Once      . albuterol 10 mg/hr (11/01/11 8295)    Medication Sig  allopurinol (ZYLOPRIM) 300 MG tablet Take 300 mg by mouth 2 (two) times daily.   buPROPion (WELLBUTRIN SR) 150 MG 12 hr tablet Take 150 mg by mouth 2 (two) times daily.   cloNIDine (CATAPRES) 0.3 MG tablet   hydrALAZINE (APRESOLINE) 50 MG tablet TAKE 1 TABLET BY MOUTH THREE  TIMES A DAY  lisinopril (PRINIVIL,ZESTRIL) 40 MG tablet Take 40 mg by mouth daily.  metoprolol (LOPRESSOR) 50 MG tablet TAKE 3 TABLETS BY MOUTH TWICE A DAY  NIASPAN 1000 MG CR tablet Take 1,000 mg by mouth daily. 1 tab daily  potassium chloride SA (KLOR-CON M20) 20 MEQ tablet 2 tabs in the AM and 1 tab in the PM  simvastatin (ZOCOR) 40 MG tablet Take 1 tablet (40 mg total) by mouth at bedtime.  warfarin (COUMADIN) 5 MG tablet Take 5-7.5 mg by mouth daily. Takes 7.5mg  daily except Tuesday and Saturday, then takes 5mg   lisinopril (PRINIVIL,ZESTRIL) 40 MG tablet Take 1 tablet (40 mg total) by mouth daily.     History   Social History  . Marital Status: Married    Spouse Name: N/A    Number of Children: 1  . Years of Education: N/A   Occupational History  . [employer]    Social History Main Topics  . Smoking status: Current Everyday Smoker -- 0.5 packs/day for 40 years    Types: Cigarettes  . Smokeless tobacco: Not on file  . Alcohol Use: No  . Drug Use: No  . Sexually Active: Not on file   Other Topics Concern  . Not on file   Social History Narrative  . No narrative on file     Family History  Problem Relation Age of Onset  . Other Father  deceased- unknow reason  . Hypertension Mother   . Lung cancer Brother      ROS: He has occasional daytime pedal edema. He has a certain level of exertion at work and it never gets chest pain with exertion. His weight is up about 10 kg in the last year and a half. Since October 2012, his weight has only increased by 2 kg. He has not had any recent illnesses, fevers or chills. He denies musculoskeletal aches or pains. Full 14 point review of systems complete and found to be negative unless listed above.  Physical Exam: Blood pressure 155/90, pulse 97, temperature 98.6 F (37 C), temperature source Oral, resp. rate 32, SpO2 100.00%.  General: Well developed, well nourished, male in no acute distress Head: Eyes PERRLA, No xanthomas.    Normocephalic and atraumatic, oropharynx without edema or exudate. Dentition - OK Lungs: bilateral rales, wheeze and few crackles  Heart:Heart rapid and irregular rate and rhythm with S1, S2; no significant murmur. pulses are 2+ all 4 extrem.   Neck: No carotid bruits. No lymphadenopathy.  JVD elevated slightly but difficult to assess because of body habitus. Abdomen: Bowel sounds present, abdomen soft and non-tender without masses or hernias noted. Msk:  No spine or cva tenderness. No weakness, no joint deformities or effusions. Extremities: No clubbing or cyanosis. Trace edema.  Neuro: Alert and oriented X 3. No focal deficits noted. Psych:  Good affect, responds appropriately Skin: No rashes or lesions noted.  Labs:   Lab Results  Component Value Date   WBC 7.7 11/01/2011   HGB 13.3 11/01/2011   HCT 39.6 11/01/2011   MCV 90.6 11/01/2011   PLT 172 11/01/2011   INR  Date/Time Value Range Status  11/01/2011  4:58 AM 5.27* 0.00 - 1.49 Final     REPEATED TO VERIFY     CRITICAL RESULT CALLED TO, READ BACK BY AND VERIFIED WITH:     FRIEDBERGER D. RN AT 06:06 11/01/11 BY EDWARDS,L  10/13/2011  3:31 PM 3.1   Final  09/22/2011  3:36 PM 4.0   Final  08/25/2011  3:32 PM 2.3   Final  08/11/2011  3:33 PM 4.8   Final  07/19/2011  3:32 PM 3.4   Final  06/07/2011  3:41 PM 2.6   Final  09/29/2009 10:28 AM 1.26  0.00 - 1.49 Final    Lab 11/01/11 0458  NA 141  K 3.9  CL 104  CO2 24  BUN 18  CREATININE 1.29  CALCIUM 9.7  PROT --  BILITOT --  ALKPHOS --  ALT --  AST --  GLUCOSE 127*    Basename 11/01/11 0501  TROPIPOC 0.02   Echo: 09/29/2009 Study Conclusions - Left ventricle: The cavity size was normal. The estimated ejection fraction was 30%. Moderate to severe global hypokinesis. - Aortic valve: There was no stenosis. Trivial regurgitation. - Aorta: The aorta was normal, not dilated, and non-diseased. - Mitral valve: Trivial regurgitation. - Left atrium: The atrium was mildly dilated.  No evidence of thrombus in the atrial cavity or appendage. - Right ventricle: The cavity size was normal. Systolic function was mildly to moderately reduced. - Right atrium: No evidence of thrombus in the atrial cavity or appendage. - Atrial septum: There was a patent foramen ovale demonstrated by bubble study. Impressions: - Moderate to severe global LV systolic dysfunction. ? tachycardia-mediated cardiomyopathy (he was in atrial fibrillation with rates up to the 160s during TEE). OK to proceed with DCCV with no left atrial  appendage thrombus.  ECG:  01-Nov-2011 04:24:45  Atrial fibrillation with rapid ventricular response Left axis deviation Nonspecific T wave abnormality Abnormal ECG 3mm/s 78mm/mV 100Hz  8.0.1 12SL 241 HD CID: 1 Referred by: Unconfirmed Vent. rate 127 BPM PR interval * ms QRS duration 104 ms QT/QTc 284/412 ms P-R-T axes * -34 24  Radiology:  Dg Chest 2 View 11/01/2011  *RADIOLOGY REPORT*  Clinical Data: Shortness of breath, cough.  CHEST - 2 VIEW  Comparison: None.  Findings: Cardiomegaly.  Mediastinal prominence.  Central vascular congestion.  Mild lung base opacities. Peribronchial thickening and mild interstitial prominence.  No pleural effusion or pneumothorax. Multilevel flowing anterior osteophytes.  No acute osseous finding.  IMPRESSION: Prominent cardiomediastinal contours.  Mild acute versus chronic bronchitic changes and lung base atelectasis.  Original Report Authenticated By: Waneta Martins, M.D.   ASSESSMENT AND PLAN:   The patient was seen today by , the patient evaluated and the data reviewed.  Principal Problem:  *Shortness of breath dyspnea - principal problem for pt but may be secondary to atrial fib; CXR not bad, no Rx at home. Will need nebs, Xopenex and Atrovent. Steroids may also be needed. We'll check a BNP as well.  Active Problems:  Atrial fibrillation with rapid ventricular response - Cardizem drip not slowing him down much and BP is  high. Continue to titrate drip, continue metoprolol; may need DCCV - had in 2011; MD advise on antiarrhythmic.   OVERWEIGHT/OBESITY and hyperglycemia - nutrition info to pt and ck HgbA1c   Chronic anticoagulation - Coumadin is therapeutic, continue per pharmacy  OSA - CPAP   Signed: Theodore Demark 11/01/2011, 8:10 AM Co-Sign MD Patient seen and examined and history reviewed. Agree with above findings and plan. Patient  Presents with 2-3 day history of worsening dyspnea. Found to be in atrial fibrillation with RVR. Exam and CXR are consistent with CHF with bilateral rales and wheezing and early CHF by CXR with Kerley B lines. Rate control improved on IV cardizem. INR supratherapeutic. Prior DCCV in 2011. EF at that time was 30% and there was a question of tachycardia mediated cardiomyopathy. I don't see that it was ever reassessed. Will admit for rate control and IV diuresis. Continue coumadin per pharmacy. Recheck LV function by Echo. Plan DCCV tomorrow.   Theron Arista [facility] 11/01/2011 11:07 AM

## 2011-11-01 NOTE — Progress Notes (Signed)
Placed pt. On cpap of 10cmh20 with full face mask. Pt. Is tolerating well at this time.

## 2011-11-01 NOTE — ED Notes (Signed)
RT called for continuous nebulizer treatment. 

## 2011-11-02 ENCOUNTER — Encounter (HOSPITAL_COMMUNITY)
Admission: EM | Disposition: A | Discharge status: Home / Self Care | Payer: Self-pay | Source: Home / Self Care | Attending: Internal Medicine

## 2011-11-02 DIAGNOSIS — I509 Heart failure, unspecified: Secondary | ICD-10-CM

## 2011-11-02 DIAGNOSIS — I4891 Unspecified atrial fibrillation: Secondary | ICD-10-CM

## 2011-11-02 LAB — COMPREHENSIVE METABOLIC PANEL
ALT: 181 U/L — ABNORMAL HIGH (ref 0–53)
Albumin: 3.8 g/dL (ref 3.5–5.2)
Alkaline Phosphatase: 220 U/L — ABNORMAL HIGH (ref 39–117)
Chloride: 103 mEq/L (ref 96–112)
GFR calc Af Amer: 73 mL/min — ABNORMAL LOW (ref >90)
Glucose, Bld: 146 mg/dL — ABNORMAL HIGH (ref 70–99)
Potassium: 3.1 mEq/L — ABNORMAL LOW (ref 3.5–5.1)
Sodium: 140 mEq/L (ref 135–145)
Total Bilirubin: 0.7 mg/dL (ref 0.3–1.2)
Total Protein: 7 g/dL (ref 6.0–8.3)

## 2011-11-02 LAB — TSH: TSH: 1.294 u[IU]/mL (ref 0.350–4.500)

## 2011-11-02 LAB — BASIC METABOLIC PANEL
BUN: 13 mg/dL (ref 6–23)
BUN: 13 mg/dL (ref 6–23)
CO2: 25 mEq/L (ref 19–32)
CO2: 24 mEq/L (ref 19–32)
Chloride: 103 mEq/L (ref 96–112)
Chloride: 106 mEq/L (ref 96–112)
Creatinine, Ser: 1.16 mg/dL (ref 0.50–1.35)
GFR calc non Af Amer: 73 mL/min — ABNORMAL LOW (ref >90)
Glucose, Bld: 127 mg/dL — ABNORMAL HIGH (ref 70–99)
Glucose, Bld: 107 mg/dL — ABNORMAL HIGH (ref 70–99)
Potassium: 3.2 mEq/L — ABNORMAL LOW (ref 3.5–5.1)
Potassium: 4.6 mEq/L (ref 3.5–5.1)

## 2011-11-02 LAB — CARDIAC PANEL(CRET KIN+CKTOT+MB+TROPI): Total CK: 399 U/L — ABNORMAL HIGH (ref 7–232)

## 2011-11-02 SURGERY — CARDIOVERSION
Anesthesia: Monitor Anesthesia Care

## 2011-11-02 MED ORDER — POTASSIUM CHLORIDE 10 MEQ/100ML IV SOLN: 10.0000 meq | INTRAVENOUS | Status: DC

## 2011-11-02 MED ORDER — FUROSEMIDE 10 MG/ML IJ SOLN
80.0000 mg | Freq: Two times a day (BID) | INTRAMUSCULAR | Status: DC
Start: 2011-11-02 — End: 2011-11-04
  Administered 2011-11-02 – 2011-11-03 (×3): 80 mg via INTRAVENOUS
  Filled 2011-11-02 (×6): qty 8

## 2011-11-02 MED ORDER — WARFARIN SODIUM 4 MG PO TABS
4.0000 mg | ORAL_TABLET | Freq: Once | ORAL | Status: AC
  Administered 2011-11-02: 4 mg via ORAL
  Filled 2011-11-02: qty 1

## 2011-11-02 MED ORDER — POTASSIUM CHLORIDE CRYS ER 20 MEQ PO TBCR
40.0000 meq | EXTENDED_RELEASE_TABLET | Freq: Once | ORAL | Status: AC
  Administered 2011-11-02: 40 meq via ORAL

## 2011-11-02 MED ORDER — POTASSIUM CHLORIDE CRYS ER 20 MEQ PO TBCR
EXTENDED_RELEASE_TABLET | ORAL | Status: AC
  Filled 2011-11-02: qty 2

## 2011-11-02 NOTE — Progress Notes (Signed)
Pt. Refused cpap for tonight. Pt. States he just wants to wear his oxygen. 

## 2011-11-02 NOTE — Progress Notes (Addendum)
ANTICOAGULATION CONSULT NOTE - Follow Up Consult  Pharmacy Consult for warfarin Indication: atrial fibrillation  Vital Signs: Temp: 99.4 F (37.4 C) (08/01 0714) Temp src: Oral (08/01 0714) BP: 156/100 mmHg (08/01 0630) Pulse Rate: 139  (08/01 0714)  Labs:  Basename 11/02/11 0245 11/01/11 1941 11/01/11 1355 11/01/11 0458  HGB -- -- -- 13.3  HCT -- -- -- 39.6  PLT -- -- -- 172  APTT -- -- -- --  LABPROT -- -- -- 49.1*  INR -- -- -- 5.27*  HEPARINUNFRC -- -- -- --  CREATININE 1.22 -- -- 1.29  CKTOTAL 399* 316* 299* --  CKMB 4.7* 4.3* 4.5* --  TROPONINI <0.30 <0.30 <0.30 --    Estimated Creatinine Clearance: 93.3 ml/min (by C-G formula based on Cr of 1.22).   Medications:  Coumadin clinic on 7/12, and dose stated as 7.5 mg daily except 5 mg on Tues, Thurs, and Sat. In speaking with patient, he tells me he only takes 5 mg on Tues and Saturdays  Assessment: 61 year old male admitted for shortness of breath, patient is on chronic coumadin for atrial fibrillation. His INR was above goal yesterday. No INR drawn this morning - now ordered. Noted plans for cardioversion this afternoon. Will follow INR trend and restart warfarin when reasonable. No bleeding complications noted, no Vitamin k given or warranted at this time.  Goal of Therapy:  INR 2-3 Monitor platelets by anticoagulation protocol: Yes   Plan:  INR now Follow warfarin plan  Severiano Gilbert 11/02/2011,8:48 AM   Addendum: INR has now trending down and is 2.9 this morning. Will given low dose of warfarin tonight to prevent further significant decline in INR.  Severiano Gilbert 11/02/2011 10:29 AM

## 2011-11-02 NOTE — Progress Notes (Signed)
Subjective: Patient still SOB  No CP  No palpitations. Objective: Filed Vitals:   11/02/11 0514 11/02/11 0630 11/02/11 0714 11/02/11 0800  BP: 171/105 156/100  156/100  Pulse: 100 95 139 109  Temp:   99.4 F (37.4 C)   TempSrc:   Oral   Resp: 34 29  32  Height:      Weight:      SpO2: 96% 93%  96%   Weight change:   Intake/Output Summary (Last 24 hours) at 11/02/11 1131 Last data filed at 11/02/11 0900  Gross per 24 hour  Intake 1566.17 ml  Output   3775 ml  Net -2208.83 ml    General: Alert, awake, oriented x3, in no acute distress Neck:  JVP is difficut to assess. Heart: Irregular rate and rhythm, without murmurs, rubs, gallops.  Lungs: Decreased airflow. Diffuse wheezes,rales. Exemities:  Tr edema.   Neuro: Grossly intact, nonfocal.  Tele:  Afib  Rates 80s to 120s.  Now below 100 Lab Results: Results for orders placed during the hospital encounter of 11/01/11 (from the past 24 hour(s))  PRO B NATRIURETIC PEPTIDE     Status: Abnormal   Collection Time   11/01/11 11:57 AM      Component Value Range   Pro B Natriuretic peptide (BNP) 6211.0 (*) 0 - 125 pg/mL  TSH     Status: Normal   Collection Time   11/01/11 12:03 PM      Component Value Range   TSH 1.294  0.350 - 4.500 uIU/mL  MRSA PCR SCREENING     Status: Normal   Collection Time   11/01/11  1:54 PM      Component Value Range   MRSA by PCR NEGATIVE  NEGATIVE  CARDIAC PANEL(CRET KIN+CKTOT+MB+TROPI)     Status: Abnormal   Collection Time   11/01/11  1:55 PM      Component Value Range   Total CK 299 (*) 7 - 232 U/L   CK, MB 4.5 (*) 0.3 - 4.0 ng/mL   Troponin I <0.30  <0.30 ng/mL   Relative Index 1.5  0.0 - 2.5  HEMOGLOBIN A1C     Status: Abnormal   Collection Time   11/01/11  1:55 PM      Component Value Range   Hemoglobin A1C 5.9 (*) <5.7 %   Mean Plasma Glucose 123 (*) <117 mg/dL  CARDIAC PANEL(CRET KIN+CKTOT+MB+TROPI)     Status: Abnormal   Collection Time   11/01/11  7:41 PM      Component Value Range    Total CK 316 (*) 7 - 232 U/L   CK, MB 4.3 (*) 0.3 - 4.0 ng/mL   Troponin I <0.30  <0.30 ng/mL   Relative Index 1.4  0.0 - 2.5  CARDIAC PANEL(CRET KIN+CKTOT+MB+TROPI)     Status: Abnormal   Collection Time   11/02/11  2:45 AM      Component Value Range   Total CK 399 (*) 7 - 232 U/L   CK, MB 4.7 (*) 0.3 - 4.0 ng/mL   Troponin I <0.30  <0.30 ng/mL   Relative Index 1.2  0.0 - 2.5  COMPREHENSIVE METABOLIC PANEL     Status: Abnormal   Collection Time   11/02/11  2:45 AM      Component Value Range   Sodium 140  135 - 145 mEq/L   Potassium 3.1 (*) 3.5 - 5.1 mEq/L   Chloride 103  96 - 112 mEq/L   CO2 24  19 -  32 mEq/L   Glucose, Bld 146 (*) 70 - 99 mg/dL   BUN 16  6 - 23 mg/dL   Creatinine, Ser 2.95  0.50 - 1.35 mg/dL   Calcium 9.2  8.4 - 62.1 mg/dL   Total Protein 7.0  6.0 - 8.3 g/dL   Albumin 3.8  3.5 - 5.2 g/dL   AST 308 (*) 0 - 37 U/L   ALT 181 (*) 0 - 53 U/L   Alkaline Phosphatase 220 (*) 39 - 117 U/L   Total Bilirubin 0.7  0.3 - 1.2 mg/dL   GFR calc non Af Amer 63 (*) >90 mL/min   GFR calc Af Amer 73 (*) >90 mL/min  PROTIME-INR     Status: Abnormal   Collection Time   11/02/11  9:06 AM      Component Value Range   Prothrombin Time 30.9 (*) 11.6 - 15.2 seconds   INR 2.91 (*) 0.00 - 1.49    Studies/Results: Dg Chest 2 View  11/01/2011  *RADIOLOGY REPORT*  Clinical Data: Shortness of breath, cough.  CHEST - 2 VIEW  Comparison: None.  Findings: Cardiomegaly.  Mediastinal prominence.  Central vascular congestion.  Mild lung base opacities. Peribronchial thickening and mild interstitial prominence.  No pleural effusion or pneumothorax. Multilevel flowing anterior osteophytes.  No acute osseous finding.  IMPRESSION: Prominent cardiomediastinal contours.  Mild acute versus chronic bronchitic changes and lung base atelectasis.  Original Report Authenticated By: Waneta Martins, M.D.    Medications: Reviewed    Patient Active Hospital Problem List:  Patient familiar to me from  clinic.    1.  Atrial fibrillation.  Remains in afib though rates now controlled.  On Lopressor and Diltiazem IV Echo with LVEF 25 % with diffuse hypokinesis. Note that he had an echo in 2011 when presented similarly.  After cardioversion he clinically improved with no SOB  Remained in SR>  Unfort echo not done.  Jason Russell does not sense his arrhythmia which is a problem.  Became SOB on Monday.  I have discussed with J Allred.    Would recomm.  1.  Cancel cardioversion today as still volume overloaded and K is low. 2.  Optimize fluid status if able.  Replete K 3.  WIll repeat EKG  Review records.  Consider Tikosyn load to help in maintaining SR>  AGain, unclear how long patient has been in afib  Presents now for second time with afib/RVR and  CHF. 4  Will need repeat echo in a few months to reevaluate function   2.  CHF.  Volume is signif increased  I would give lasix.  He has responded some so far.  Replete K On b blocker and lisinopril   Will supplemtn with IV b blocker and cut down on diltiazem.    3.  HTN  Has been difficult to control as outpatient.  Patient says at home it has been running in 140s though  Says he is using CPAP.  Renal USN has been negative. Follow.  4.  Sleep apnea  COntinue CPAP.  LOS: 1 day   Jason Russell 11/02/2011, 11:31 AM

## 2011-11-02 NOTE — Progress Notes (Signed)
Switched pt. To auto titration mode. Pt. Stated he was not tolerating the cpap. Pt. Is tolerating the auto titration mode at this time.

## 2011-11-03 DIAGNOSIS — I10 Essential (primary) hypertension: Secondary | ICD-10-CM

## 2011-11-03 DIAGNOSIS — E663 Overweight: Secondary | ICD-10-CM

## 2011-11-03 LAB — BASIC METABOLIC PANEL
BUN: 12 mg/dL (ref 6–23)
BUN: 15 mg/dL (ref 6–23)
CO2: 28 mEq/L (ref 19–32)
Chloride: 101 mEq/L (ref 96–112)
Chloride: 99 mEq/L (ref 96–112)
GFR calc Af Amer: 75 mL/min — ABNORMAL LOW (ref >90)
GFR calc non Af Amer: 65 mL/min — ABNORMAL LOW (ref >90)
Glucose, Bld: 132 mg/dL — ABNORMAL HIGH (ref 70–99)
Glucose, Bld: 166 mg/dL — ABNORMAL HIGH (ref 70–99)
Potassium: 3.2 mEq/L — ABNORMAL LOW (ref 3.5–5.1)
Potassium: 2.8 mEq/L — ABNORMAL LOW (ref 3.5–5.1)
Sodium: 140 mEq/L (ref 135–145)
Sodium: 139 mEq/L (ref 135–145)

## 2011-11-03 MED ORDER — POTASSIUM CHLORIDE CRYS ER 20 MEQ PO TBCR
20.0000 meq | EXTENDED_RELEASE_TABLET | Freq: Once | ORAL | Status: AC
  Administered 2011-11-03: 20 meq via ORAL
  Filled 2011-11-03: qty 1

## 2011-11-03 MED ORDER — POTASSIUM CHLORIDE CRYS ER 20 MEQ PO TBCR
60.0000 meq | EXTENDED_RELEASE_TABLET | Freq: Two times a day (BID) | ORAL | Status: DC
  Administered 2011-11-03 – 2011-11-04 (×2): 60 meq via ORAL
  Filled 2011-11-03: qty 2
  Filled 2011-11-03 (×3): qty 3
  Filled 2011-11-03: qty 1

## 2011-11-03 MED ORDER — METOPROLOL TARTRATE 1 MG/ML IV SOLN
10.0000 mg | INTRAVENOUS | Status: DC
  Administered 2011-11-03 – 2011-11-04 (×2): 10 mg via INTRAVENOUS
  Filled 2011-11-03 (×2): qty 10
  Filled 2011-11-03: qty 5
  Filled 2011-11-03 (×7): qty 10

## 2011-11-03 MED ORDER — WARFARIN SODIUM 5 MG PO TABS
5.0000 mg | ORAL_TABLET | Freq: Once | ORAL | Status: AC
  Administered 2011-11-03: 5 mg via ORAL
  Filled 2011-11-03: qty 1

## 2011-11-03 MED ORDER — METOPROLOL TARTRATE 1 MG/ML IV SOLN
5.0000 mg | Freq: Every day | INTRAVENOUS | Status: DC
Start: 2011-11-03 — End: 2011-11-03
  Administered 2011-11-03 (×4): 5 mg via INTRAVENOUS
  Filled 2011-11-03 (×3): qty 5

## 2011-11-03 NOTE — Progress Notes (Signed)
Subjective: breathng is getting better.  No CP Objective: Filed Vitals:   11/03/11 0400 11/03/11 0500 11/03/11 0553 11/03/11 0745  BP: 127/71  154/100 149/86  Pulse: 89   104  Temp:    98.7 F (37.1 C)  TempSrc:    Oral  Resp: 29   30  Height:      Weight:  297 lb 9.9 oz (135 kg)    SpO2: 95%   95%   Weight change: -8 lb 6 oz (-3.8 kg)  Intake/Output Summary (Last 24 hours) at 11/03/11 0846 Last data filed at 11/03/11 0557  Gross per 24 hour  Intake   1598 ml  Output   4075 ml  Net  -2477 ml    General: Alert, awake, oriented x3, in no acute distress Neck: Full not able to assess JVP Heart: Irreg rate and rhythm.  Lungs: Rales bilaterally 1/3 up Exemities:  No edema.   Neuro: Grossly intact, nonfocal.  Tele:  Afib  100s.   Lab Results: Results for orders placed during the hospital encounter of 11/01/11 (from the past 24 hour(s))  PROTIME-INR     Status: Abnormal   Collection Time   11/02/11  9:06 AM      Component Value Range   Prothrombin Time 30.9 (*) 11.6 - 15.2 seconds   INR 2.91 (*) 0.00 - 1.49  BASIC METABOLIC PANEL     Status: Abnormal   Collection Time   11/02/11  9:06 AM      Component Value Range   Sodium 141  135 - 145 mEq/L   Potassium 3.2 (*) 3.5 - 5.1 mEq/L   Chloride 103  96 - 112 mEq/L   CO2 25  19 - 32 mEq/L   Glucose, Bld 127 (*) 70 - 99 mg/dL   BUN 13  6 - 23 mg/dL   Creatinine, Ser 1.61  0.50 - 1.35 mg/dL   Calcium 9.1  8.4 - 09.6 mg/dL   GFR calc non Af Amer 73 (*) >90 mL/min   GFR calc Af Amer 84 (*) >90 mL/min  BASIC METABOLIC PANEL     Status: Abnormal   Collection Time   11/02/11  6:15 PM      Component Value Range   Sodium 142  135 - 145 mEq/L   Potassium 4.6  3.5 - 5.1 mEq/L   Chloride 106  96 - 112 mEq/L   CO2 24  19 - 32 mEq/L   Glucose, Bld 107 (*) 70 - 99 mg/dL   BUN 13  6 - 23 mg/dL   Creatinine, Ser 0.45  0.50 - 1.35 mg/dL   Calcium 9.3  8.4 - 40.9 mg/dL   GFR calc non Af Amer 67 (*) >90 mL/min   GFR calc Af Amer 77 (*)  >90 mL/min  PROTIME-INR     Status: Abnormal   Collection Time   11/03/11  5:38 AM      Component Value Range   Prothrombin Time 30.6 (*) 11.6 - 15.2 seconds   INR 2.88 (*) 0.00 - 1.49    Studies/Results: No results found.  Medications: Reviewed.   Patient Active Hospital Problem List: Atrial fibrillation with rapid ventricular response (11/01/2011)   Assessment: Rates still not optimal but better.   Goal to maintain SR.  Plan for Tikosyn initiation once volume improved.  If remains in afib then plan DCCV. Continue coumadin. Will d/c dilt  Add IV metoprolol with PO.  Should be able to use lower dose once in  SR.  Chronic anticoagulation (11/01/2011)   Assessment: Continue coumadin.  Acute on chronic systolic CHF (congestive heart failure) (11/01/2011)   Assessment: Volume is improving but still increased.  COntinue IV diuressis D/c cardiazem.  Continue other meds. Will need to rassess LVEF after he has maintained SR for a few months.  HTN:  FOllow.  Still high at times.      LOS: 2 days   Dietrich Pates 11/03/2011, 8:46 AM

## 2011-11-03 NOTE — Progress Notes (Signed)
Pt had two 5 beat  episode of V Tach  B/P 148/84 HR 142 RR 25 02 sat 93 % with 2 L Dr Tenny Craw called and was made aware. Order received to give Metoprolol 10 mg IV every 4 hrs and stat Bmet.  Dr Tenny Craw stated that she will call again in one hour. Lab called and informed. Pt denies feeling his heart race. We will continue to monitor.

## 2011-11-03 NOTE — Progress Notes (Signed)
RT Note: pt refused to wear CPAP tonight.                         

## 2011-11-03 NOTE — Progress Notes (Signed)
ANTICOAGULATION CONSULT NOTE - Follow Up Consult  Pharmacy Consult for warfarin Indication: atrial fibrillation  Vital Signs: Temp: 98.7 F (37.1 C) (08/02 0745) Temp src: Oral (08/02 0745) BP: 149/86 mmHg (08/02 0745) Pulse Rate: 104  (08/02 0745)  Labs:  Alvira Philips 11/03/11 0538 11/02/11 1815 11/02/11 0906 11/02/11 0245 11/01/11 1941 11/01/11 1355 11/01/11 0458  HGB -- -- -- -- -- -- 13.3  HCT -- -- -- -- -- -- 39.6  PLT -- -- -- -- -- -- 172  APTT -- -- -- -- -- -- --  LABPROT 30.6* -- 30.9* -- -- -- 49.1*  INR 2.88* -- 2.91* -- -- -- 5.27*  HEPARINUNFRC -- -- -- -- -- -- --  CREATININE -- 1.16 1.08 1.22 -- -- --  CKTOTAL -- -- -- 399* 316* 299* --  CKMB -- -- -- 4.7* 4.3* 4.5* --  TROPONINI -- -- -- <0.30 <0.30 <0.30 --    Estimated Creatinine Clearance: 97.6 ml/min (by C-G formula based on Cr of 1.16).   Medications:  Coumadin clinic on 7/12, and dose stated as 7.5 mg daily except 5 mg on Tues, Thurs, and Sat. In speaking with patient, he tells me he only takes 5 mg on Tues and Saturdays  Assessment: 61 year old male admitted for shortness of breath, patient is on chronic coumadin for atrial fibrillation. His INR was above goal at admit (INR=5.27)and now at 2.88. Cardioversion plans cancelled on 8/1 and plans for Tikosyn once volume status improved.  Goal of Therapy:  INR 2-3 Monitor platelets by anticoagulation protocol: Yes   Plan:  -Coumadin 5mg  today -Daily PT/INR  [name], Pharm D 11/03/2011 9:17 AM

## 2011-11-04 LAB — BASIC METABOLIC PANEL
CO2: 30 mEq/L (ref 19–32)
Glucose, Bld: 116 mg/dL — ABNORMAL HIGH (ref 70–99)
Potassium: 3 mEq/L — ABNORMAL LOW (ref 3.5–5.1)
Sodium: 139 mEq/L (ref 135–145)

## 2011-11-04 LAB — PROTIME-INR: INR: 2.11 — ABNORMAL HIGH (ref 0.00–1.49)

## 2011-11-04 MED ORDER — POTASSIUM CHLORIDE CRYS ER 20 MEQ PO TBCR
60.0000 meq | EXTENDED_RELEASE_TABLET | Freq: Three times a day (TID) | ORAL | Status: DC
  Administered 2011-11-04 – 2011-11-06 (×6): 60 meq via ORAL
  Filled 2011-11-04 (×6): qty 3

## 2011-11-04 MED ORDER — FUROSEMIDE 80 MG PO TABS
80.0000 mg | ORAL_TABLET | Freq: Two times a day (BID) | ORAL | Status: DC
  Administered 2011-11-04 – 2011-11-06 (×5): 80 mg via ORAL
  Filled 2011-11-04 (×9): qty 1

## 2011-11-04 MED ORDER — POTASSIUM CHLORIDE CRYS ER 20 MEQ PO TBCR
20.0000 meq | EXTENDED_RELEASE_TABLET | Freq: Once | ORAL | Status: AC
  Administered 2011-11-04: 20 meq via ORAL

## 2011-11-04 MED ORDER — SPIRONOLACTONE 12.5 MG HALF TABLET
12.5000 mg | ORAL_TABLET | Freq: Every day | ORAL | Status: DC
  Administered 2011-11-04 – 2011-11-05 (×2): 12.5 mg via ORAL
  Filled 2011-11-04 (×2): qty 1

## 2011-11-04 MED ORDER — WARFARIN SODIUM 7.5 MG PO TABS
7.5000 mg | ORAL_TABLET | Freq: Once | ORAL | Status: AC
  Administered 2011-11-04: 7.5 mg via ORAL
  Filled 2011-11-04: qty 1

## 2011-11-04 NOTE — Progress Notes (Signed)
Pt converted to SR @about  0143, EKG done and placed in chart. Will cont to monitor closely.

## 2011-11-04 NOTE — Progress Notes (Signed)
Report from Night RN. Chart reviewed together. Handoff complete.  

## 2011-11-04 NOTE — Progress Notes (Signed)
B met resulted potassium level 2.8 mEk/L Dr Tenny Craw called and ordered Potassium 20 mEq x 1 now . Heart rate trending down in the low 100 B/P 131/87 . Report given to the oncoming nurse. We will continue to monitor.

## 2011-11-04 NOTE — Progress Notes (Signed)
ANTICOAGULATION CONSULT NOTE - Follow Up Consult  Pharmacy Consult for warfarin Indication: atrial fibrillation  Vital Signs: Temp: 98.3 F (36.8 C) (08/03 0808) Temp src: Oral (08/03 0808) BP: 146/92 mmHg (08/03 0400) Pulse Rate: 76  (08/03 0400)  Labs:  Jason Russell 11/04/11 0527 11/03/11 2205 11/03/11 0925 11/03/11 0538 11/02/11 0906 11/02/11 0245 11/01/11 1941 11/01/11 1355  HGB -- -- -- -- -- -- -- --  HCT -- -- -- -- -- -- -- --  PLT -- -- -- -- -- -- -- --  APTT -- -- -- -- -- -- -- --  LABPROT 24.0* -- -- 30.6* 30.9* -- -- --  INR 2.11* -- -- 2.88* 2.91* -- -- --  HEPARINUNFRC -- -- -- -- -- -- -- --  CREATININE 1.17 1.19 1.11 -- -- -- -- --  CKTOTAL -- -- -- -- -- 399* 316* 299*  CKMB -- -- -- -- -- 4.7* 4.3* 4.5*  TROPONINI -- -- -- -- -- <0.30 <0.30 <0.30    Estimated Creatinine Clearance: 95.9 ml/min (by C-G formula based on Cr of 1.17).   Medications:  Coumadin clinic on 7/12, and dose stated as 7.5 mg daily except 5 mg on Tues, Thurs, and Sat. In speaking with patient, he tells me he only takes 5 mg on Tues and Saturdays  Assessment: 61 year old male admitted for shortness of breath, patient is on chronic coumadin for atrial fibrillation. His INR was above goal at admit (INR=5.27) and now at 2.11. Cardioversion plans cancelled on 8/1 and plans for Tikosyn once volume status improved.  Goal of Therapy:  INR 2-3 Monitor platelets by anticoagulation protocol: Yes   Plan:  -Coumadin 7.5mg  today -Daily PT/INR  [name], Pharm D 11/04/2011 9:49 AM

## 2011-11-04 NOTE — Progress Notes (Signed)
Subjective:  Patient feels better this am.  Converted to NSR last pm. Still hypokalemic despite K repletion.  BP still borderline high.  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  Objective:  Vital Signs in the last 24 hours: Temp:  [98 F (36.7 C)-98.9 F (37.2 C)] 98.3 F (36.8 C) (08/03 0808) Pulse Rate:  [45-142] 76  (08/03 0400) Resp:  [20-31] 20  (08/03 0400) BP: (130-148)/(75-92) 146/92 mmHg (08/03 0400) SpO2:  [92 %-99 %] 97 % (08/03 0400) Weight:  [292 lb 8.8 oz (132.7 kg)] 292 lb 8.8 oz (132.7 kg) (08/03 0452)  Intake/Output from previous day: 08/02 0701 - 08/03 0700 In: 1273.1 [P.O.:1060; I.V.:213.1] Out: 4025 [Urine:4025] Intake/Output from this shift:       . allopurinol  300 mg Oral BID  . atorvastatin  20 mg Oral QHS  . buPROPion  150 mg Oral BID  . furosemide  80 mg Oral BID  . hydrALAZINE  50 mg Oral Q8H  . lisinopril  40 mg Oral Daily  . metoprolol  150 mg Oral BID  . niacin  1,000 mg Oral QHS  . potassium chloride  20 mEq Oral Once  . potassium chloride  20 mEq Oral Once  . potassium chloride  60 mEq Oral TID  . sodium chloride  3 mL Intravenous Q12H  . warfarin  5 mg Oral ONCE-1800  . warfarin  7.5 mg Oral ONCE-1800  . Warfarin - Pharmacist Dosing Inpatient   Does not apply q1800  . DISCONTD: furosemide  80 mg Intravenous BID  . DISCONTD: metoprolol  10 mg Intravenous Q4H  . DISCONTD: metoprolol  5 mg Intravenous 6 X Daily  . DISCONTD: potassium chloride  20 mEq Oral QHS  . DISCONTD: potassium chloride  40 mEq Oral Daily  . DISCONTD: potassium chloride  60 mEq Oral BID      Physical Exam: The patient appears to be in no distress.  Head and neck exam reveals that the pupils are equal and reactive.  The extraocular movements are full.  There is no scleral icterus.  Mouth and pharynx are benign.  No lymphadenopathy.  No carotid bruits.  The jugular venous pressure is normal.  Thyroid is  not enlarged or tender.  Chest reveals very minimal basal rales. Expansion of the chest is symmetrical.  Heart reveals no abnormal lift or heave.  First and second heart sounds are normal.  There is no murmur gallop rub or click.  The abdomen is soft and nontender.  Bowel sounds are normoactive.  There is no hepatosplenomegaly or mass.  There are no abdominal bruits.  Extremities reveal no phlebitis or edema.  Pedal pulses are good.  There is no cyanosis or clubbing.  Neurologic exam is normal strength and no lateralizing weakness.  No sensory deficits.  Integument reveals no rash  Lab Results: No results found for this basename: WBC:2,HGB:2,PLT:2 in the last 72 hours  Basename 11/04/11 0527 11/03/11 2205  NA 139 139  K 3.0* 2.8*  CL 99 99  CO2 30 30  GLUCOSE 116* 166*  BUN 15 15  CREATININE 1.17 1.19    Basename 11/02/11 0245 11/01/11 1941  TROPONINI <0.30 <0.30   Hepatic Function Panel  Basename 11/02/11 0245  PROT 7.0  ALBUMIN 3.8  AST 127*  ALT 181*  ALKPHOS 220*  BILITOT 0.7  BILIDIR --  IBILI --   No results found for this basename: CHOL in the  last 72 hours No results found for this basename: PROTIME in the last 72 hours  Imaging: Imaging results have been reviewed  Cardiac Studies:  Assessment/Plan:  Patient Active Hospital Problem List  Atrial fibrillation with rapid ventricular response (11/01/2011)   Assessment: Back in NSR   Plan: Continue metoprolol 150 mg BID  Chronic anticoagulation (11/01/2011)   Assessment: per pharmacy  Acute on chronic systolic CHF (congestive heart failure) (11/01/2011)   Assessment: Improving but still hypokalemic   Plan: Add spironolactone.  Switch to po lasix. Daily BMET   LOS: 3 days    Cassell Clement 11/04/2011, 11:29 AM

## 2011-11-05 LAB — BASIC METABOLIC PANEL
CO2: 29 mEq/L (ref 19–32)
Chloride: 104 mEq/L (ref 96–112)
Creatinine, Ser: 1.22 mg/dL (ref 0.50–1.35)
GFR calc Af Amer: 73 mL/min — ABNORMAL LOW (ref >90)
Potassium: 3.7 mEq/L (ref 3.5–5.1)
Sodium: 142 mEq/L (ref 135–145)

## 2011-11-05 LAB — PROTIME-INR: INR: 2.02 — ABNORMAL HIGH (ref 0.00–1.49)

## 2011-11-05 MED ORDER — SPIRONOLACTONE 25 MG PO TABS
25.0000 mg | ORAL_TABLET | Freq: Every day | ORAL | Status: DC
  Administered 2011-11-06 – 2011-11-07 (×2): 25 mg via ORAL
  Filled 2011-11-05 (×2): qty 1

## 2011-11-05 MED ORDER — WARFARIN SODIUM 7.5 MG PO TABS
7.5000 mg | ORAL_TABLET | Freq: Once | ORAL | Status: AC
  Administered 2011-11-05: 7.5 mg via ORAL
  Filled 2011-11-05: qty 1

## 2011-11-05 NOTE — Progress Notes (Signed)
ANTICOAGULATION CONSULT NOTE - Follow Up Consult  Pharmacy Consult for warfarin Indication: atrial fibrillation  Vital Signs: Temp: 97.9 F (36.6 C) (08/04 0410) Temp src: Oral (08/04 0410) BP: 153/93 mmHg (08/04 0411) Pulse Rate: 83  (08/03 2142)  Labs:  Jason Russell 11/05/11 0547 11/04/11 0527 11/03/11 2205 11/03/11 0538  HGB -- -- -- --  HCT -- -- -- --  PLT -- -- -- --  APTT -- -- -- --  LABPROT 23.2* 24.0* -- 30.6*  INR 2.02* 2.11* -- 2.88*  HEPARINUNFRC -- -- -- --  CREATININE 1.22 1.17 1.19 --  CKTOTAL -- -- -- --  CKMB -- -- -- --  TROPONINI -- -- -- --    Estimated Creatinine Clearance: 91.7 ml/min (by C-G formula based on Cr of 1.22).   Medications:  Coumadin clinic on 7/12, and dose stated as 7.5 mg daily except 5 mg on Tues, Thurs, and Sat. In speaking with patient, he tells me he only takes 5 mg on Tues and Saturdays  Assessment: 61 year old male admitted for shortness of breath, patient is on chronic coumadin for atrial fibrillation. His INR was above goal at admit (INR=5.27) and now at 2.02. Cardioversion plans cancelled on 8/1 and plans for Tikosyn once volume status improved.  Goal of Therapy:  INR 2-3 Monitor platelets by anticoagulation protocol: Yes   Plan:  -Coumadin 7.5mg  today -Daily PT/INR  Jason Russell, Pharm D 11/05/2011 7:59 AM

## 2011-11-05 NOTE — Progress Notes (Signed)
Report from Night RN. Chart reviewed together. Handoff complete.  

## 2011-11-05 NOTE — Progress Notes (Signed)
Subjective:  Patient feels better this am.  Remains in NSR. Potassium gradually improving. Spironolactone begun yesterday.   BP still borderline high.  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  Objective:  Vital Signs in the last 24 hours: Temp:  [97.7 F (36.5 C)-98.4 F (36.9 C)] 97.9 F (36.6 C) (08/04 0804) Pulse Rate:  [82-83] 83  (08/03 2142) Resp:  [20-26] 23  (08/04 0411) BP: (136-161)/(76-104) 153/93 mmHg (08/04 0411) SpO2:  [96 %-97 %] 97 % (08/04 0411) Weight:  [291 lb 0.1 oz (132 kg)] 291 lb 0.1 oz (132 kg) (08/04 0531)  Intake/Output from previous day: 08/03 0701 - 08/04 0700 In: 340 [P.O.:340] Out: 3150 [Urine:3150] Intake/Output from this shift:       . allopurinol  300 mg Oral BID  . atorvastatin  20 mg Oral QHS  . buPROPion  150 mg Oral BID  . furosemide  80 mg Oral BID  . hydrALAZINE  50 mg Oral Q8H  . lisinopril  40 mg Oral Daily  . metoprolol  150 mg Oral BID  . niacin  1,000 mg Oral QHS  . potassium chloride  60 mEq Oral TID  . sodium chloride  3 mL Intravenous Q12H  . spironolactone  25 mg Oral Daily  . warfarin  7.5 mg Oral ONCE-1800  . warfarin  7.5 mg Oral ONCE-1800  . Warfarin - Pharmacist Dosing Inpatient   Does not apply q1800  . DISCONTD: furosemide  80 mg Intravenous BID  . DISCONTD: metoprolol  10 mg Intravenous Q4H  . DISCONTD: potassium chloride  60 mEq Oral BID  . DISCONTD: spironolactone  12.5 mg Oral Daily      Physical Exam: The patient appears to be in no distress.  Head and neck exam reveals that the pupils are equal and reactive.  The extraocular movements are full.  There is no scleral icterus.  Mouth and pharynx are benign.  No lymphadenopathy.  No carotid bruits.  The jugular venous pressure is normal.  Thyroid is not enlarged or tender.  Chest reveals very minimal basal rales. Expansion of the chest is symmetrical.  Heart reveals no abnormal lift or heave.   First and second heart sounds are normal.  There is no murmur gallop rub or click.  The abdomen is soft and nontender.  Bowel sounds are normoactive.  There is no hepatosplenomegaly or mass.  There are no abdominal bruits.  Extremities reveal no phlebitis or edema.  Pedal pulses are good.  There is no cyanosis or clubbing.  Neurologic exam is normal strength and no lateralizing weakness.  No sensory deficits.  Integument reveals no rash  Lab Results: No results found for this basename: WBC:2,HGB:2,PLT:2 in the last 72 hours  Basename 11/05/11 0547 11/04/11 0527  NA 142 139  K 3.7 3.0*  CL 104 99  CO2 29 30  GLUCOSE 123* 116*  BUN 14 15  CREATININE 1.22 1.17   No results found for this basename: TROPONINI:2,CK,MB:2 in the last 72 hours Hepatic Function Panel No results found for this basename: PROT,ALBUMIN,AST,ALT,ALKPHOS,BILITOT,BILIDIR,IBILI in the last 72 hours No results found for this basename: CHOL in the last 72 hours No results found for this basename: PROTIME in the last 72 hours  Imaging: Imaging results have been reviewed  Cardiac Studies:  Assessment/Plan:  Patient Active Hospital Problem List  Atrial fibrillation with rapid ventricular response (11/01/2011)   Assessment: Back in NSR   Plan: Continue  metoprolol 150 mg BID  Chronic anticoagulation (11/01/2011)   Assessment: per pharmacy  Acute on chronic systolic CHF (congestive heart failure) (11/01/2011)   Assessment: Improving but still borderline low potassium despite KCL 180 meq/day and starting spironolactone yesterday.  Plan: Increase spironolactone to 25 mg daily. BP still running high. Daily BMET.  Consider starting tikosyn in am if fluid balance stable and K+ stable. Ambulate today.   LOS: 4 days    Cassell Clement 11/05/2011, 10:48 AM

## 2011-11-06 DIAGNOSIS — Z7901 Long term (current) use of anticoagulants: Secondary | ICD-10-CM

## 2011-11-06 LAB — BASIC METABOLIC PANEL
BUN: 16 mg/dL (ref 6–23)
BUN: 16 mg/dL (ref 6–23)
CO2: 27 mEq/L (ref 19–32)
CO2: 27 mEq/L (ref 19–32)
Calcium: 9.4 mg/dL (ref 8.4–10.5)
Calcium: 9.5 mg/dL (ref 8.4–10.5)
Creatinine, Ser: 1.25 mg/dL (ref 0.50–1.35)
Creatinine, Ser: 1.24 mg/dL (ref 0.50–1.35)
Glucose, Bld: 111 mg/dL — ABNORMAL HIGH (ref 70–99)
Glucose, Bld: 178 mg/dL — ABNORMAL HIGH (ref 70–99)

## 2011-11-06 LAB — PROTIME-INR: Prothrombin Time: 23.3 seconds — ABNORMAL HIGH (ref 11.6–15.2)

## 2011-11-06 MED ORDER — AMIODARONE HCL 200 MG PO TABS
400.0000 mg | ORAL_TABLET | Freq: Every day | ORAL | Status: DC
  Administered 2011-11-06 – 2011-11-07 (×2): 400 mg via ORAL
  Filled 2011-11-06 (×2): qty 2

## 2011-11-06 MED ORDER — FUROSEMIDE 10 MG/ML IJ SOLN
80.0000 mg | Freq: Once | INTRAMUSCULAR | Status: AC
  Administered 2011-11-06: 80 mg via INTRAVENOUS
  Filled 2011-11-06: qty 8

## 2011-11-06 MED ORDER — POTASSIUM CHLORIDE CRYS ER 20 MEQ PO TBCR
60.0000 meq | EXTENDED_RELEASE_TABLET | Freq: Two times a day (BID) | ORAL | Status: DC
  Administered 2011-11-06 – 2011-11-07 (×2): 60 meq via ORAL
  Filled 2011-11-06 (×2): qty 3

## 2011-11-06 MED ORDER — FUROSEMIDE 80 MG PO TABS
80.0000 mg | ORAL_TABLET | Freq: Every day | ORAL | Status: DC
  Administered 2011-11-07: 80 mg via ORAL
  Filled 2011-11-06: qty 1

## 2011-11-06 MED ORDER — WARFARIN SODIUM 7.5 MG PO TABS
7.5000 mg | ORAL_TABLET | Freq: Once | ORAL | Status: AC
  Administered 2011-11-06: 7.5 mg via ORAL
  Filled 2011-11-06: qty 1

## 2011-11-06 MED ORDER — MAGNESIUM SULFATE 40 MG/ML IJ SOLN
2.0000 g | Freq: Once | INTRAMUSCULAR | Status: AC
  Administered 2011-11-06: 2 g via INTRAVENOUS
  Filled 2011-11-06 (×2): qty 50

## 2011-11-06 NOTE — Progress Notes (Addendum)
Subjective: Patient feeling better.  Breathing OK  NO CP Objective: Filed Vitals:   11/05/11 2323 11/06/11 0329 11/06/11 0500 11/06/11 0745  BP: 140/96 141/93  148/95  Pulse:    83  Temp:  98.1 F (36.7 C)  98.6 F (37 C)  TempSrc:  Oral  Oral  Resp: 20   18  Height:      Weight:   289 lb 11 oz (131.4 kg)   SpO2: 96% 95%  96%   Weight change: -1 lb 5.2 oz (-0.6 kg)  Intake/Output Summary (Last 24 hours) at 11/06/11 0908 Last data filed at 11/05/11 1300  Gross per 24 hour  Intake      0 ml  Output    800 ml  Net   -800 ml    I/Os total:  -11,077 cc  General: Alert, awake, oriented x3, in no acute distress Neck:  JVP is normal Heart: Regular rate and rhythm, without murmurs, rubs, gallops.  Lungs: Clear to auscultation.  No rales or wheezes. Exemities:  No edema.   Neuro: Grossly intact, nonfocal.  Tele:  SR now.  Had some afib yesterday witn NSVT.  Lab Results: Results for orders placed during the hospital encounter of 11/01/11 (from the past 24 hour(s))  PROTIME-INR     Status: Abnormal   Collection Time   11/06/11  4:47 AM      Component Value Range   Prothrombin Time 23.3 (*) 11.6 - 15.2 seconds   INR 2.03 (*) 0.00 - 1.49  BASIC METABOLIC PANEL     Status: Abnormal   Collection Time   11/06/11  4:47 AM      Component Value Range   Sodium 137  135 - 145 mEq/L   Potassium 3.6  3.5 - 5.1 mEq/L   Chloride 99  96 - 112 mEq/L   CO2 27  19 - 32 mEq/L   Glucose, Bld 111 (*) 70 - 99 mg/dL   BUN 16  6 - 23 mg/dL   Creatinine, Ser 3.08  0.50 - 1.35 mg/dL   Calcium 9.4  8.4 - 65.7 mg/dL   GFR calc non Af Amer 61 (*) >90 mL/min   GFR calc Af Amer 71 (*) >90 mL/min    Studies/Results: No results found.  Medications: Reviewed   Patient Active Hospital Problem List: Atrial fibrillation with rapid ventricular response (11/01/2011)   Assessment: Currently in SR.  Did have some afib yesterday Will review EKG   Probably back off on Lasix to qd  Continue supplement of K   If EKG and MG ok begin Tikosyn    Chronic anticoagulation (11/01/2011)   Assessment: Continue coumadin   Acute on chronic systolic CHF (congestive heart failure) (11/01/2011)   Assessment: Diuresed greater than 11 L.  CLinically much improved. Would continue on current regimen, adjusting doses as tolerated.  Try to keep n SR  Patient willl need an echo in several months to reassess LVEF  HTN.  Follow.  Closer to optimal.     LOS: 5 days   Dietrich Pates 11/06/2011, 9:08 AM  Mg was 1.6.  K was 3.8  Will change lasix to 80 1x per day.  K to BID  May be able to back down further. EKG to evaluate QT

## 2011-11-06 NOTE — Progress Notes (Addendum)
ANTICOAGULATION CONSULT NOTE - Follow Up Consult  Pharmacy Consult for warfarin Indication: atrial fibrillation  Vital Signs: Temp: 98.6 F (37 C) (08/05 0745) Temp src: Oral (08/05 0745) BP: 148/95 mmHg (08/05 0745) Pulse Rate: 83  (08/05 0745)  Labs:  Basename 11/06/11 0447 11/05/11 0547 11/04/11 0527  HGB -- -- --  HCT -- -- --  PLT -- -- --  APTT -- -- --  LABPROT 23.3* 23.2* 24.0*  INR 2.03* 2.02* 2.11*  HEPARINUNFRC -- -- --  CREATININE 1.25 1.22 1.17  CKTOTAL -- -- --  CKMB -- -- --  TROPONINI -- -- --   Estimated Creatinine Clearance: 89.3 ml/min (by C-G formula based on Cr of 1.25).  Medications:  Coumadin clinic on 7/12, and dose stated as 7.5 mg daily except 5 mg on Tues, Thurs, and Sat. In speaking with patient, he tells me he only takes 5 mg on Tues and Saturdays  Assessment: 61 year old male admitted for shortness of breath, patient is on chronic coumadin for atrial fibrillation. His INR is now within therapeutic range without noted complications of bleeding. Cardioversion plans cancelled on 8/1 and plans for Tikosyn once volume status improved.  He will need supplemental potassium and magnesium prior to Tikosyn.  Drug/Drug Interactions: Xanthine Oxidase Inhibitors / Anticoagulants    Warning: The hypoprothrombinemic effect of warfarin may be increased by allopurinol.  Mechanism: allopurinol may inhibit the metabolism of warfarin in some patients.  Goal of Therapy:  INR 2-3 Monitor platelets by anticoagulation protocol: Yes   Plan:  - Coumadin 7.5mg  today - Daily PT/INR - Monitor Allopurinol/Warfarin response for possible drug/drug interactions. - Consider Potassium every 1 hour x 6 to get K+ > 4.0 - Consider 2 gm. Magnesium bolus to get Mag. > 2.0.  Nadara Mustard, PharmD., MS Clinical Pharmacist Pager:  [phone]  Thank you for allowing pharmacy to be part of this patients care team.

## 2011-11-06 NOTE — Progress Notes (Signed)
Pt. Refuses CPAP at this time. Pt. Was made aware to call RT if he decide to wear CPAP. All vitals are within normal limits at this time & pt. Isn't in any distress.

## 2011-11-06 NOTE — Progress Notes (Signed)
EKG today QTc is too long to initiate Tikosyn With history of CHF will use amiodarone  Start 400 mg per day. Ambulate  Give 1 additional IV lasix 60mg   Then 80 qd tomorrow.  WIll need to follow closely.  May need bid dosing as outpatient but K may be difficult to keep up.

## 2011-11-06 NOTE — Progress Notes (Signed)
Pt ambulated in hallway with only supervision.  Pt tolerated well - denies shortness of breath or pain.  Will continue to monitor.

## 2011-11-06 NOTE — Progress Notes (Signed)
Pt has refused CPAP for tonight. PT says the hospital machine does not work like his home machine and he is not as comfortable with it. PT also says he hopes to go home tomorrow anyway and he will use his own machine then. RT will continue to monitor.

## 2011-11-07 LAB — BASIC METABOLIC PANEL
BUN: 19 mg/dL (ref 6–23)
Chloride: 98 mEq/L (ref 96–112)
GFR calc Af Amer: 72 mL/min — ABNORMAL LOW (ref >90)
Potassium: 4.6 mEq/L (ref 3.5–5.1)

## 2011-11-07 LAB — CBC
HCT: 43.3 % (ref 39.0–52.0)
Hemoglobin: 14.6 g/dL (ref 13.0–17.0)
MCHC: 33.7 g/dL (ref 30.0–36.0)

## 2011-11-07 LAB — PROTIME-INR: Prothrombin Time: 25.9 seconds — ABNORMAL HIGH (ref 11.6–15.2)

## 2011-11-07 MED ORDER — ATORVASTATIN CALCIUM 20 MG PO TABS: 20.0000 mg | ORAL_TABLET | Freq: Every day | ORAL | Status: DC

## 2011-11-07 MED ORDER — WARFARIN SODIUM 5 MG PO TABS: 5.0000 mg | ORAL_TABLET | Freq: Every day | ORAL | Status: DC

## 2011-11-07 MED ORDER — WARFARIN SODIUM 7.5 MG PO TABS: 7.5000 mg | ORAL_TABLET | ORAL | Status: DC

## 2011-11-07 MED ORDER — SPIRONOLACTONE 25 MG PO TABS: 25.0000 mg | ORAL_TABLET | Freq: Every day | ORAL | Status: DC

## 2011-11-07 MED ORDER — WARFARIN SODIUM 5 MG PO TABS
5.0000 mg | ORAL_TABLET | ORAL | Status: DC
  Filled 2011-11-07: qty 1

## 2011-11-07 MED ORDER — FUROSEMIDE 80 MG PO TABS: 80.0000 mg | ORAL_TABLET | Freq: Every day | ORAL | Status: DC

## 2011-11-07 MED ORDER — AMIODARONE HCL 400 MG PO TABS: 400.0000 mg | ORAL_TABLET | Freq: Every day | ORAL | Status: DC

## 2011-11-07 NOTE — Progress Notes (Signed)
Pt discharged to home with wife. He received discharge instructions with no further questions. He is aware of follow up appointments, when to call the doctor, low salt diet, and how to take/about his new medications. He is stable for DC. Clarified with [place], [place] about follow up appointment for coumadin levels. Pt states that Dr. Tenny Craw has said it could be pushed back a week. Pt should keep follow up appointment because of amioderone's interaction with coumadin levels. PA called back after pt had been wheeled out. Message left on phone to keep appointment. Duwaine Maxin, RN

## 2011-11-07 NOTE — Discharge Summary (Signed)
CARDIOLOGY DISCHARGE SUMMARY    Patient ID: Jason Russell,  MRN: [id number], DOB/AGE: [birth year] 61 y.o.  Admit date: 11/01/2011 Discharge date: 11/07/2011  Primary Care Physician: Nolon Nations, MD Primary Cardiologist: Dietrich Pates, MD  Primary Discharge Diagnosis:  1. Atrial fibrillation with rapid ventricular response 2. Acute systolic CHF 3. HTN 4. Elevated transaminases  Secondary Discharge Diagnoses:  1. OSA 2. Dyslipidemia 3. Obesity  Procedures This Admission:  1. Transthoracic echocardiogram 11/01/2011 - Left ventricle: The cavity size was moderately dilated. Wall thickness was increased in a pattern of mild LVH. The estimated ejection fraction was 25%. - Aortic valve: Structurally normal valve. Cusp separation was normal. Doppler: Transvalvular velocity was within the normal range. There was no stenosis. Mild regurgitation. - Mitral valve: Structurally normal valve. Leaflet separation was normal. Doppler: Transvalvular velocity was within the normal range. There was no evidence for stenosis. No regurgitation. - Left atrium: The atrium was mildly dilated. - Right ventricle: The cavity size was normal. Wall thickness was normal. Systolic function was normal. - Pulmonic valve: Structurally normal valve. Cusp separation was normal. Doppler: Transvalvular velocity was within the normal range. No regurgitation. - Tricuspid valve: Structurally normal valve. Leaflet separation was normal. Doppler: Transvalvular velocity was within the normal range. Mild regurgitation. - Right atrium: The atrium was normal in size. - Pericardium: There was no pericardial effusion. - Systemic veins: Inferior vena cava: The vessel was dilated; the respirophasic diameter changes were blunted (< 50%); findings are consistent with elevated central venous pressure.  History and Hospital Course:  Jason Russell is a 61 year old gentleman with PAF, HTN, dyslipidemia and OSA who presented 11/01/2011 with  worsening SOB. He was found to have rapid AF and acute systolic CHF and was admitted for treatment. An echocardiogram was performed revealing LV dysfunction with EF 25%. He was diuresed with IV lasix and also given potassium and magnesium supplementation. He was started on IV diltiazem and metoprolol for rate control. He spontaneously converted to SR on 11/04/2011. His QTc was too long to initiate Tikosyn; therefore, he was started on amiodarone. He remained in SR with no further sustained episodes of AF. He diuresed >11 liters and his SOB resolved. His medications were adjusted. Simvastatin was changed to atorvastatin since amiodarone was added to his regimen. He will need a repeat BMET and LFTs in 1-2 weeks. Spironolactone was added. Clonidine was discontinued. He remains hemodynamically stable and is ambulating without difficulty. He has been seen, examined and deemed stable for discharge today by Dr. Lewayne Bunting. He will follow-up with Dr. Tenny Craw in 3-4 weeks.   Discharge Vitals: Blood pressure 133/79, pulse 77, temperature 97.7 F (36.5 C), temperature source Oral, resp. rate 18, height 6\' 1"  (1.854 m), weight 292 lb 15.9 oz (132.9 kg), SpO2 98.00%.   Labs: Lab Results  Component Value Date   WBC 8.3 11/07/2011   HGB 14.6 11/07/2011   HCT 43.3 11/07/2011   MCV 89.3 11/07/2011   PLT 219 11/07/2011     Lab 11/07/11 0516 11/02/11 0245  NA 136 --  K 4.6 --  CL 98 --  CO2 27 --  BUN 19 --  CREATININE 1.23 --  CALCIUM 9.3 --  PROT -- 7.0  BILITOT -- 0.7  ALKPHOS -- 220*  ALT -- 181*  AST -- 127*  GLUCOSE 115* --    Basename 11/07/11 0516  INR 2.32*    Disposition:  The patient is being discharged in stable condition.  Follow-up: Follow-up Information  Follow up with Dietrich Pates, MD on 11/30/2011. (At 2:30 PM)    Contact information:   [address]  [address] [place] [address] [phone]    Follow up with [facility] on 11/10/2011. (At 3:30 PM for  Coumadin follow-up (INR check))    Contact information:   [address]  [address] [place] [address] [phone]    Follow up with [facility] [address] on 11/16/2011. (For labs only (BMET, LFTs) - must be fasting; can come to lab anytime after 8:30 AM)    Contact information:   [address]  [address] [place] [address] [phone]     Discharge Medications:  Medication List  As of 11/07/2011  9:28 AM   STOP taking these medications         cloNIDine 0.3 MG tablet      simvastatin 40 MG tablet         TAKE these medications         allopurinol 300 MG tablet   Commonly known as: ZYLOPRIM   Take 300 mg by mouth 2 (two) times daily.      amiodarone 400 MG tablet   Commonly known as: PACERONE   Take 1 tablet (400 mg total) by mouth daily.      atorvastatin 20 MG tablet   Commonly known as: LIPITOR   Take 1 tablet (20 mg total) by mouth at bedtime.      buPROPion 150 MG 12 hr tablet   Commonly known as: WELLBUTRIN SR   Take 150 mg by mouth 2 (two) times daily.      furosemide 80 MG tablet   Commonly known as: LASIX   Take 1 tablet (80 mg total) by mouth daily.      hydrALAZINE 50 MG tablet   Commonly known as: APRESOLINE   TAKE 1 TABLET BY MOUTH THREE TIMES A DAY      lisinopril 40 MG tablet   Commonly known as: PRINIVIL,ZESTRIL   Take 1 tablet (40 mg total) by mouth daily.      lisinopril 40 MG tablet   Commonly known as: PRINIVIL,ZESTRIL   Take 40 mg by mouth daily.      metoprolol 50 MG tablet   Commonly known as: LOPRESSOR   TAKE 3 TABLETS BY MOUTH TWICE A DAY      NIASPAN 1000 MG CR tablet   Generic drug: niacin   Take 1,000 mg by mouth daily. 1 tab daily      potassium chloride SA 20 MEQ tablet   Commonly known as: K-DUR,KLOR-CON   2 tabs in the AM and 1 tab in the PM      spironolactone 25 MG tablet   Commonly known as: ALDACTONE   Take 1 tablet (25 mg total) by mouth daily.      warfarin 5 MG  tablet   Commonly known as: COUMADIN   Take 1-1.5 tablets (5-7.5 mg total) by mouth daily. Take 5 mg (1 tablet) once daily on Mon, Wed, Fri and Sun; Take 7.5 mg (1-1/2 tablets) once daily on Tuesday, Thursday and Saturday.          Duration of Discharge Encounter: Greater than 30 minutes including physician time.  Signed, Rick Duff, PA-C 11/07/2011, 9:28 AM  Cardiology Attending  Patient seen and examined. Agree with above. Will discharge on amiodarone with followup with Dr. Tenny Craw.   Lewayne Bunting, M.D.

## 2011-11-07 NOTE — Progress Notes (Signed)
Pt had run of SVT on tele.  On assessment, pt states he was up using the bathroom.  Pt denies any complaints of pain or shortness of breath, no active distress noted.  EKG obtained, pt converted to NSR with rate 70s.

## 2011-11-07 NOTE — Progress Notes (Signed)
ANTICOAGULATION CONSULT NOTE - Follow Up Consult  Pharmacy Consult for warfarin Indication: atrial fibrillation  Vital Signs: Temp: 97.7 F (36.5 C) (08/06 0552) Temp src: Oral (08/06 0552) BP: 147/93 mmHg (08/06 0552) Pulse Rate: 70  (08/06 0552)  Labs:  Basename 11/07/11 0516 11/06/11 0932 11/06/11 0447 11/05/11 0547  HGB 14.6 -- -- --  HCT 43.3 -- -- --  PLT 219 -- -- --  APTT -- -- -- --  LABPROT 25.9* -- 23.3* 23.2*  INR 2.32* -- 2.03* 2.02*  HEPARINUNFRC -- -- -- --  CREATININE 1.23 1.24 1.25 --  CKTOTAL -- -- -- --  CKMB -- -- -- --  TROPONINI -- -- -- --   Estimated Creatinine Clearance: 91.3 ml/min (by C-G formula based on Cr of 1.23).  Medications:  Coumadin clinic on 7/12, and dose stated as 7.5 mg daily except 5 mg on Tues, Thurs, and Sat. In speaking with patient, he tells me he only takes 5 mg on Tues and Saturdays  Assessment: Admit with increasing DOE, SOB, suspected COPD exacerbation vs. afib with RVR  Anticoagulation: Warfarin- h/o afib. Admit INR 5.27. INR now 2.32.  Infectious Disease: c/o some cough and wheezing, Afebrile. WBC 8.3.  Cardiovascular: Hx HTN, HLD. 147/93,70. Considering tikosyn once volume improves. Episodes of VTach noted currently in SR. K 4.6. Mg 1.6 (received IV Mg 2g x 1) Meds: amiodarone, hydralazine, lisinopril, metoprolol, niacin, atorva, lasix, spiron. CE's negative.   Endocrinology: glucose<150, no noted hx DM. Hx gout - on allopurinol  Gastrointestinal / Nutrition:   Neurology: Bupropion  Nephrology: Scr 1.23 stable  Lytes: Need K>4 and Mg>2 to start Tikosyn  Pulmonary: Hx COPD, OSA - morbid obesity  Hematology / Oncology: CBC ok.  PTA Medication Issues: meds reordered appropriately  Best Practices: Warfarin   Plan Coumadin 5mg  MWF,Sun Coumadin 7.5mg  TThSat    Jason Russell, PharmD, BCPS Clinical Staff Pharmacist Pager [phone]

## 2011-11-07 NOTE — Progress Notes (Signed)
     Patient: Jason Russell Date of Encounter: 11/07/2011, 8:19 AM Admit date: 11/01/2011     Subjective  Jason Russell has no complaints this AM. He is ambulating without difficulty. His SOB has improved. He denies CP, palpitations, dizziness, near syncope or syncope.   Objective  Physical Exam: Vitals: BP 147/93  Pulse 70  Temp 97.7 F (36.5 C) (Oral)  Resp 18  Ht 6\' 1"  (1.854 m)  Wt 292 lb 15.9 oz (132.9 kg)  BMI 38.66 kg/m2  SpO2 98% General: Well developed, well appearing 61 year old male in no acute distress. Neck: Supple. JVD not elevated. Lungs: Clear bilaterally to auscultation without wheezes, rales, or rhonchi. Breathing is unlabored. Heart: RRR S1 S2 without murmurs, rub or gallop.  Abdomen: Soft, non-distended. Extremities: No clubbing or cyanosis. No edema.  Distal pedal pulses are 2+ and equal bilaterally. Neuro: Alert and oriented X 3. Moves all extremities spontaneously. No focal deficits.  Intake/Output: Intake/Output Summary (Last 24 hours) at 11/07/11 0819 Last data filed at 11/06/11 2142  Gross per 24 hour  Intake    773 ml  Output   1650 ml  Net   -877 ml   Inpatient Medications:  . allopurinol  300 mg Oral BID  . amiodarone  400 mg Oral Daily  . atorvastatin  20 mg Oral QHS  . buPROPion  150 mg Oral BID  . furosemide  80 mg Intravenous Once  . furosemide  80 mg Oral Daily  . hydrALAZINE  50 mg Oral Q8H  . lisinopril  40 mg Oral Daily  . magnesium sulfate 1 - 4 g bolus IVPB  2 g Intravenous Once  . metoprolol  150 mg Oral BID  . niacin  1,000 mg Oral QHS  . potassium chloride  60 mEq Oral BID  . sodium chloride  3 mL Intravenous Q12H  . spironolactone  25 mg Oral Daily  . warfarin  7.5 mg Oral ONCE-1800   Labs:  [facility] 11/07/11 0516 11/06/11 0932  NA 136 136  K 4.6 3.8  CL 98 98  CO2 27 27  GLUCOSE 115* 178*  BUN 19 16  CREATININE 1.23 1.24  CALCIUM 9.3 9.5  MG -- 1.6  PHOS -- --    Basename 11/07/11 0516  WBC 8.3  NEUTROABS  --  HGB 14.6  HCT 43.3  MCV 89.3  PLT 219   No results found for this basename: CKTOTAL:4,CKMB:4,TROPONINI:4 in the last 72 hours No components found with this basename: POCBNP:3  INR 2.32 Mg 1.6 on 11/06/2011   Radiology/Studies: None in last 48 hours  Echocardiogram: Conclusions: - Left ventricle: The cavity size was moderately dilated. Wall thickness was increased in a pattern of mild LVH. The estimated ejection fraction was 25%. Aortic valve: Mild regurgitation. Left atrium: The atrium was mildly dilated. Pulmonary arteries: PA peak pressure: 61mm Hg (S).  Telemetry: NSR currently; brief, nonsustained episodes of AF early this AM   Assessment and Plan  1. Atrial fibrillation with rapid ventricular response - currently in SR; QTc too long to initiate Tikosyn so amiodarone was started yesterday 2. Acute on chronic systolic CHF - improved; has diuresed >11 L; continue current regimen; repeat echo in a few months after maintenance of SR  3. HTN - mildly elevated this AM; consider increasing hydralazine to 75 mg every 8 hours  Dr. Ladona Russell to see and make further recommendations. Signed, Kristy Catoe PA-C

## 2011-11-10 ENCOUNTER — Other Ambulatory Visit: Payer: Self-pay

## 2011-11-10 ENCOUNTER — Ambulatory Visit (INDEPENDENT_AMBULATORY_CARE_PROVIDER_SITE_OTHER): Payer: No Typology Code available for payment source | Admitting: *Deleted

## 2011-11-10 DIAGNOSIS — Z7901 Long term (current) use of anticoagulants: Secondary | ICD-10-CM

## 2011-11-10 DIAGNOSIS — I4891 Unspecified atrial fibrillation: Secondary | ICD-10-CM

## 2011-11-10 LAB — POCT INR: INR: 2.7

## 2011-11-10 MED ORDER — LISINOPRIL 40 MG PO TABS: 40.0000 mg | ORAL_TABLET | Freq: Every day | ORAL | Status: DC

## 2011-11-13 ENCOUNTER — Encounter: Payer: Self-pay | Admitting: Psychiatry

## 2011-11-16 ENCOUNTER — Ambulatory Visit (INDEPENDENT_AMBULATORY_CARE_PROVIDER_SITE_OTHER): Payer: No Typology Code available for payment source | Admitting: *Deleted

## 2011-11-16 ENCOUNTER — Other Ambulatory Visit: Payer: Self-pay | Admitting: *Deleted

## 2011-11-16 DIAGNOSIS — I4891 Unspecified atrial fibrillation: Secondary | ICD-10-CM

## 2011-11-16 DIAGNOSIS — E785 Hyperlipidemia, unspecified: Secondary | ICD-10-CM

## 2011-11-16 DIAGNOSIS — I10 Essential (primary) hypertension: Secondary | ICD-10-CM

## 2011-11-16 DIAGNOSIS — R0602 Shortness of breath: Secondary | ICD-10-CM

## 2011-11-16 DIAGNOSIS — Z7901 Long term (current) use of anticoagulants: Secondary | ICD-10-CM

## 2011-11-16 LAB — BASIC METABOLIC PANEL
CO2: 24 mEq/L (ref 19–32)
GFR: 24.28 mL/min — ABNORMAL LOW (ref >60.00)
Glucose, Bld: 159 mg/dL — ABNORMAL HIGH (ref 70–99)
Potassium: 4.5 mEq/L (ref 3.5–5.1)
Sodium: 139 mEq/L (ref 135–145)

## 2011-11-16 LAB — HEPATIC FUNCTION PANEL: Total Bilirubin: 0.3 mg/dL (ref 0.3–1.2)

## 2011-11-20 ENCOUNTER — Other Ambulatory Visit (INDEPENDENT_AMBULATORY_CARE_PROVIDER_SITE_OTHER): Payer: No Typology Code available for payment source

## 2011-11-20 DIAGNOSIS — R0602 Shortness of breath: Secondary | ICD-10-CM

## 2011-11-20 DIAGNOSIS — I10 Essential (primary) hypertension: Secondary | ICD-10-CM

## 2011-11-20 LAB — BASIC METABOLIC PANEL
BUN: 24 mg/dL — ABNORMAL HIGH (ref 6–23)
Chloride: 108 mEq/L (ref 96–112)
Potassium: 4.2 mEq/L (ref 3.5–5.1)

## 2011-11-21 ENCOUNTER — Other Ambulatory Visit: Payer: Self-pay | Admitting: *Deleted

## 2011-11-21 DIAGNOSIS — I4891 Unspecified atrial fibrillation: Secondary | ICD-10-CM

## 2011-11-21 DIAGNOSIS — R0602 Shortness of breath: Secondary | ICD-10-CM

## 2011-11-23 ENCOUNTER — Other Ambulatory Visit (INDEPENDENT_AMBULATORY_CARE_PROVIDER_SITE_OTHER): Payer: No Typology Code available for payment source

## 2011-11-23 ENCOUNTER — Other Ambulatory Visit: Payer: Self-pay | Admitting: Internal Medicine

## 2011-11-23 DIAGNOSIS — I4891 Unspecified atrial fibrillation: Secondary | ICD-10-CM

## 2011-11-23 DIAGNOSIS — Z7901 Long term (current) use of anticoagulants: Secondary | ICD-10-CM

## 2011-11-23 DIAGNOSIS — R0602 Shortness of breath: Secondary | ICD-10-CM

## 2011-11-23 LAB — BASIC METABOLIC PANEL
BUN: 27 mg/dL — ABNORMAL HIGH (ref 6–23)
Chloride: 109 mEq/L (ref 96–112)
Potassium: 3.8 mEq/L (ref 3.5–5.1)
Sodium: 139 mEq/L (ref 135–145)

## 2011-11-24 ENCOUNTER — Encounter: Payer: Self-pay | Admitting: *Deleted

## 2011-11-24 ENCOUNTER — Ambulatory Visit (INDEPENDENT_AMBULATORY_CARE_PROVIDER_SITE_OTHER): Payer: No Typology Code available for payment source | Admitting: Internal Medicine

## 2011-11-24 ENCOUNTER — Encounter: Payer: Self-pay | Admitting: Internal Medicine

## 2011-11-24 VITALS — BP 140/98 | HR 57 | Ht 73.0 in | Wt 300.0 lb

## 2011-11-24 DIAGNOSIS — I10 Essential (primary) hypertension: Secondary | ICD-10-CM

## 2011-11-24 DIAGNOSIS — E782 Mixed hyperlipidemia: Secondary | ICD-10-CM

## 2011-11-24 DIAGNOSIS — I4891 Unspecified atrial fibrillation: Secondary | ICD-10-CM

## 2011-11-24 LAB — BASIC METABOLIC PANEL
CO2: 25 mEq/L (ref 19–32)
Calcium: 9.3 mg/dL (ref 8.4–10.5)
Chloride: 108 mEq/L (ref 96–112)
Glucose, Bld: 97 mg/dL (ref 70–99)
Potassium: 3.9 mEq/L (ref 3.5–5.1)
Sodium: 141 mEq/L (ref 135–145)

## 2011-11-24 MED ORDER — SPIRONOLACTONE 25 MG PO TABS: ORAL_TABLET | ORAL | Status: DC

## 2011-11-24 MED ORDER — AMLODIPINE BESYLATE 5 MG PO TABS: 5.0000 mg | ORAL_TABLET | Freq: Every day | ORAL | Status: DC

## 2011-11-24 MED ORDER — AMIODARONE HCL 100 MG PO TABS: ORAL_TABLET | ORAL | Status: DC

## 2011-11-24 NOTE — Patient Instructions (Addendum)
Fasting Lipid panel today Will call you with results.  Start Norvasc 5mg  every day  Decrease Aldactone to 12.5 mg every day  Amiodarone 400mg  every day for 3 weeks then 300mg  per day for 8 weeks  Return for BMET LAbs in 2 weeks with a nurse visit for EKG and BP check.  See Dr.Ross in 8 weeks.

## 2011-11-24 NOTE — Progress Notes (Signed)
HPI Patient is a 61 year old with a history of HTN, Sleep APnea PAF and CHF.  HE was admtted to [facility] in late July with exacerbation of CHF>  Found to be in afib with RVR.  He underwent large diuresis with marked improvement in his symptoms.   He converted to SR and was sarted on amiodarone.  (QT long, not a Tikosyn candidate). Since d/c he has felt good.  His breathing is good.  He has never had palpitations He has had labs checked and had signif bump in CR.  Lasix and Lisinopri held. No Known Allergies  Current Outpatient Prescriptions  Medication Sig Dispense Refill  . allopurinol (ZYLOPRIM) 300 MG tablet Take 300 mg by mouth 2 (two) times daily.       [name] amiodarone (PACERONE) 400 MG tablet Take 1 tablet (400 mg total) by mouth daily.  30 tablet  3  . buPROPion (WELLBUTRIN SR) 150 MG 12 hr tablet Take 150 mg by mouth 2 (two) times daily.       . hydrALAZINE (APRESOLINE) 50 MG tablet TAKE 1 TABLET BY MOUTH THREE TIMES A DAY  270 tablet  1  . metoprolol (LOPRESSOR) 50 MG tablet TAKE 3 TABLETS BY MOUTH TWICE A DAY  540 tablet  3  . NIASPAN 1000 MG CR tablet Take 1,000 mg by mouth daily. 1 tab daily      . spironolactone (ALDACTONE) 25 MG tablet Take 1 tablet (25 mg total) by mouth daily.  30 tablet  3  . warfarin (COUMADIN) 5 MG tablet Take 1-1.5 tablets (5-7.5 mg total) by mouth daily. Take 5 mg (1 tablet) once daily on Mon, Wed, Fri and Sun; Take 7.5 mg (1-1/2 tablets) once daily on Tuesday, Thursday and Saturday.  60 tablet  3  . lisinopril (PRINIVIL,ZESTRIL) 40 MG tablet Take 1 tablet (40 mg total) by mouth daily.  90 tablet  3    Past Medical History  Diagnosis Date  . Hypertension   . Dyslipidemia   . Obesity   . Atrial fibrillation   . OSA (obstructive sleep apnea)   . Tobacco abuse   . COPD (chronic obstructive pulmonary disease)   . Shortness of breath   . Asthma     Past Surgical History  Procedure Date  . Total hip arthroplasty 1999  . Cardioversion 2011     Family History  Problem Relation Age of Onset  . Other Father     deceased- unknow reason  . Hypertension Mother   . Lung cancer Brother     History   Social History  . Marital Status: Married    Spouse Name: N/A    Number of Children: 1  . Years of Education: N/A   Occupational History  . 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 Smoking status: Current Everyday Smoker -- 0.5 packs/day for 40 years    Types: Cigarettes  . Smokeless tobacco: Never Used  . Alcohol Use: No  . Drug Use: No  . Sexually Active: Yes   Other Topics Concern  . Not on file   Social History Narrative  . No narrative on file    Review of Systems:  All systems reviewed.  They are negative to the above problem except as previously stated.  Vital Signs: BP 140/98  Pulse 57  Ht 6\' 1"  (1.854 m)  Wt 300 lb (136.079 kg)  BMI 39.58 kg/m2  Physical Exam Patient is in NAD  HEENT:  Normocephalic, atraumatic. EOMI, PERRLA.  Neck: JVP is normal.  No bruits.  Lungs: clear to auscultation. No rales no wheezes.  Heart: Regular rate and rhythm. Normal S1, S2. No S3.   No significant murmurs. PMI not displaced.  Abdomen:  Supple, nontender. Normal bowel sounds. No masses. No hepatomegaly.  Extremities:   Good distal pulses throughout. No lower extremity edema.  Musculoskeletal :moving all extremities.  Neuro:   alert and oriented x3.  CN II-XII grossly intact.  EKG:  Sinus bradycardia 57 bpm.  Assessment and Plan:  1.  CHF>  Volume status looks good.  Has done well in SR> WIll continue to hold diuretic and ACE I.  REcheck renal function for now and reassess.  Plan for repeat echo in mid winter.  2.  AFib.  Continues to maintain SR.  On Amiodarone  Will continue 400 qd for a total of 6 wks then 300 mg per day for 2 months then 200 mg per day.    3.  HTN.  BP is still up.  Will decrease aldactone to 12. 5.  Add amlodipine 5 mg.  Patient will need repeat BP checks.  Check  EKG and BMET in 10 days.  4.  HL  Continue meds.  WIll need to follow  5.  OSA  Continue CPAP.

## 2011-11-30 ENCOUNTER — Ambulatory Visit (INDEPENDENT_AMBULATORY_CARE_PROVIDER_SITE_OTHER): Payer: No Typology Code available for payment source | Admitting: Pharmacist

## 2011-11-30 ENCOUNTER — Encounter: Payer: No Typology Code available for payment source | Admitting: Internal Medicine

## 2011-11-30 DIAGNOSIS — I4891 Unspecified atrial fibrillation: Secondary | ICD-10-CM

## 2011-11-30 DIAGNOSIS — Z7901 Long term (current) use of anticoagulants: Secondary | ICD-10-CM

## 2011-11-30 LAB — POCT INR: INR: 5.5

## 2011-12-07 ENCOUNTER — Other Ambulatory Visit: Payer: Self-pay | Admitting: *Deleted

## 2011-12-07 DIAGNOSIS — I4891 Unspecified atrial fibrillation: Secondary | ICD-10-CM

## 2011-12-07 DIAGNOSIS — I10 Essential (primary) hypertension: Secondary | ICD-10-CM

## 2011-12-08 ENCOUNTER — Other Ambulatory Visit (INDEPENDENT_AMBULATORY_CARE_PROVIDER_SITE_OTHER): Payer: No Typology Code available for payment source

## 2011-12-08 ENCOUNTER — Ambulatory Visit (INDEPENDENT_AMBULATORY_CARE_PROVIDER_SITE_OTHER): Payer: No Typology Code available for payment source | Admitting: Internal Medicine

## 2011-12-08 ENCOUNTER — Ambulatory Visit (INDEPENDENT_AMBULATORY_CARE_PROVIDER_SITE_OTHER): Payer: No Typology Code available for payment source | Admitting: *Deleted

## 2011-12-08 VITALS — BP 150/70 | HR 58 | Resp 18

## 2011-12-08 DIAGNOSIS — I4891 Unspecified atrial fibrillation: Secondary | ICD-10-CM

## 2011-12-08 DIAGNOSIS — I10 Essential (primary) hypertension: Secondary | ICD-10-CM

## 2011-12-08 DIAGNOSIS — Z7901 Long term (current) use of anticoagulants: Secondary | ICD-10-CM

## 2011-12-08 LAB — BASIC METABOLIC PANEL
BUN: 20 mg/dL (ref 6–23)
CO2: 25 mEq/L (ref 19–32)
Chloride: 108 mEq/L (ref 96–112)
Glucose, Bld: 96 mg/dL (ref 70–99)
Potassium: 3.3 mEq/L — ABNORMAL LOW (ref 3.5–5.1)

## 2011-12-11 ENCOUNTER — Telehealth: Payer: Self-pay | Admitting: *Deleted

## 2011-12-11 DIAGNOSIS — E876 Hypokalemia: Secondary | ICD-10-CM

## 2011-12-11 DIAGNOSIS — I4891 Unspecified atrial fibrillation: Secondary | ICD-10-CM

## 2011-12-11 NOTE — Telephone Encounter (Signed)
Pt confirmed he is on lisinopril and aldactone, lab date set for 12/13/11, orders placed.

## 2011-12-11 NOTE — Telephone Encounter (Signed)
Message copied by Antony Odea on Mon Dec 11, 2011  2:10 PM ------      Message from: Dietrich Pates V      Created: Mon Dec 11, 2011  1:32 PM       Recomm rechecking BMET and MG and BNP.  He is on lisinopril and aldactone  K should not be low.  Need to confirm.

## 2011-12-13 ENCOUNTER — Other Ambulatory Visit (INDEPENDENT_AMBULATORY_CARE_PROVIDER_SITE_OTHER): Payer: No Typology Code available for payment source

## 2011-12-13 DIAGNOSIS — E782 Mixed hyperlipidemia: Secondary | ICD-10-CM

## 2011-12-13 DIAGNOSIS — E876 Hypokalemia: Secondary | ICD-10-CM

## 2011-12-13 DIAGNOSIS — I4891 Unspecified atrial fibrillation: Secondary | ICD-10-CM

## 2011-12-13 LAB — BRAIN NATRIURETIC PEPTIDE: Pro B Natriuretic peptide (BNP): 52 pg/mL (ref 0.0–100.0)

## 2011-12-14 LAB — BASIC METABOLIC PANEL
Calcium: 9 mg/dL (ref 8.4–10.5)
GFR: 61.84 mL/min (ref >60.00)
Potassium: 3.6 mEq/L (ref 3.5–5.1)
Sodium: 141 mEq/L (ref 135–145)

## 2011-12-14 LAB — LIPID PANEL
HDL: 35.4 mg/dL — ABNORMAL LOW (ref >39.00)
LDL Cholesterol: 96 mg/dL (ref 0–99)
Total CHOL/HDL Ratio: 4
VLDL: 24.2 mg/dL (ref 0.0–40.0)

## 2011-12-19 ENCOUNTER — Ambulatory Visit (INDEPENDENT_AMBULATORY_CARE_PROVIDER_SITE_OTHER): Payer: No Typology Code available for payment source | Admitting: Pharmacist

## 2011-12-19 DIAGNOSIS — I4891 Unspecified atrial fibrillation: Secondary | ICD-10-CM

## 2011-12-19 DIAGNOSIS — Z7901 Long term (current) use of anticoagulants: Secondary | ICD-10-CM

## 2011-12-19 LAB — POCT INR: INR: 1.8

## 2011-12-22 ENCOUNTER — Other Ambulatory Visit: Payer: Self-pay | Admitting: Internal Medicine

## 2011-12-25 NOTE — Progress Notes (Signed)
HPI  No Known Allergies  Current Outpatient Prescriptions  Medication Sig Dispense Refill  . allopurinol (ZYLOPRIM) 300 MG tablet Take 300 mg by mouth 2 (two) times daily.       [name] amiodarone (PACERONE) 400 MG tablet Take 1 tablet (400 mg total) by mouth daily.  30 tablet  3  . amLODipine (NORVASC) 5 MG tablet Take 1 tablet (5 mg total) by mouth daily.  30 tablet  6  . buPROPion (WELLBUTRIN SR) 150 MG 12 hr tablet Take 150 mg by mouth 2 (two) times daily.       . hydrALAZINE (APRESOLINE) 50 MG tablet TAKE 1 TABLET BY MOUTH THREE TIMES A DAY  270 tablet  1  . metoprolol (LOPRESSOR) 50 MG tablet TAKE 3 TABLETS BY MOUTH TWICE A DAY  540 tablet  3  . spironolactone (ALDACTONE) 25 MG tablet Take one half tab every day  30 tablet  3  . warfarin (COUMADIN) 5 MG tablet Take 1-1.5 tablets (5-7.5 mg total) by mouth daily. Take 5 mg (1 tablet) once daily on Mon, Wed, Fri and Sun; Take 7.5 mg (1-1/2 tablets) once daily on Tuesday, Thursday and Saturday.  60 tablet  3  . amiodarone (PACERONE) 100 MG tablet Take as directed  30 tablet  6  . lisinopril (PRINIVIL,ZESTRIL) 40 MG tablet Take 1 tablet (40 mg total) by mouth daily.  90 tablet  3  . NIASPAN 1000 MG CR tablet TAKE 1 TABLET BY MOUTH AT BEDTIME  30 each  2    Past Medical History  Diagnosis Date  . Hypertension   . Dyslipidemia   . Obesity   . Atrial fibrillation   . OSA (obstructive sleep apnea)   . Tobacco abuse   . COPD (chronic obstructive pulmonary disease)   . Shortness of breath   . Asthma     Past Surgical History  Procedure Date  . Total hip arthroplasty 1999  . Cardioversion 2011    Family History  Problem Relation Age of Onset  . Other Father     deceased- unknow reason  . Hypertension Mother   . Lung cancer Brother     History   Social History  . Marital Status: Married    Spouse Name: N/A    Number of Children: 1  . Years of Education: N/A   Occupational History  . 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 Smoking status: Current Every Day Smoker -- 0.5 packs/day for 40 years    Types: Cigarettes  . Smokeless tobacco: Never Used  . Alcohol Use: No  . Drug Use: No  . Sexually Active: Yes   Other Topics Concern  . Not on file   Social History Narrative  . No narrative on file    Review of Systems:  All systems reviewed.  They are negative to the above problem except as previously stated.  Vital Signs: BP 150/70  Pulse 58  Resp 18  Physical Exam Patient is in NAD HEENT:  Normocephalic, atraumatic. EOMI, PERRLA.  Neck: JVP is normal.   Lungs: clear to auscultation. No rales no wheezes.  Heart: Regular rate and rhythm. Normal S1, S2. No S3.   No significant murmurs. PMI not displaced.  Abdomen:  Supple, nontender. Normal bowel sounds. No masses. No hepatomegaly.  Extremities:   Good distal pulses throughout. No lower extremity edema.  Musculoskeletal :moving all extremities.  Neuro:   alert and oriented x3.  CN II-XII  grossly intact.   Assessment and Plan:  1.  CHF.  Volume status looks good.  Will confirm labs.  Keep on same meds  Would recomm a repeat echo in several month to document LV decline.  2.  HTN  Not optimal today.  Will need to review meds.  Confirm  .  3.  Afib  Remains in SR.  Continue amio  4.  HL  Review labs  5.  Sleep apnea.  Keep on CPAP.

## 2012-01-02 ENCOUNTER — Ambulatory Visit (INDEPENDENT_AMBULATORY_CARE_PROVIDER_SITE_OTHER): Payer: No Typology Code available for payment source

## 2012-01-02 DIAGNOSIS — I4891 Unspecified atrial fibrillation: Secondary | ICD-10-CM

## 2012-01-02 DIAGNOSIS — Z7901 Long term (current) use of anticoagulants: Secondary | ICD-10-CM

## 2012-01-02 LAB — POCT INR: INR: 2.9

## 2012-01-07 ENCOUNTER — Other Ambulatory Visit: Payer: Self-pay | Admitting: Internal Medicine

## 2012-01-23 ENCOUNTER — Ambulatory Visit (INDEPENDENT_AMBULATORY_CARE_PROVIDER_SITE_OTHER): Payer: No Typology Code available for payment source | Admitting: *Deleted

## 2012-01-23 DIAGNOSIS — I4891 Unspecified atrial fibrillation: Secondary | ICD-10-CM

## 2012-01-23 DIAGNOSIS — Z7901 Long term (current) use of anticoagulants: Secondary | ICD-10-CM

## 2012-01-23 LAB — POCT INR: INR: 1.8

## 2012-01-31 ENCOUNTER — Telehealth: Payer: Self-pay | Admitting: *Deleted

## 2012-01-31 ENCOUNTER — Other Ambulatory Visit: Payer: Self-pay | Admitting: Internal Medicine

## 2012-01-31 DIAGNOSIS — I10 Essential (primary) hypertension: Secondary | ICD-10-CM

## 2012-01-31 NOTE — Telephone Encounter (Signed)
Question Atorvastatin, Amlodipine, & amiodarone can all go together.  Please call [facility] on [address], [phone].

## 2012-02-01 ENCOUNTER — Other Ambulatory Visit: Payer: Self-pay | Admitting: *Deleted

## 2012-02-01 DIAGNOSIS — I10 Essential (primary) hypertension: Secondary | ICD-10-CM

## 2012-02-01 MED ORDER — ATORVASTATIN CALCIUM 20 MG PO TABS: 20.0000 mg | ORAL_TABLET | Freq: Every day | ORAL | Status: DC

## 2012-02-01 MED ORDER — SPIRONOLACTONE 25 MG PO TABS: ORAL_TABLET | ORAL | Status: DC

## 2012-02-02 ENCOUNTER — Other Ambulatory Visit: Payer: Self-pay | Admitting: Internal Medicine

## 2012-02-02 ENCOUNTER — Ambulatory Visit: Payer: No Typology Code available for payment source | Admitting: Internal Medicine

## 2012-02-06 ENCOUNTER — Ambulatory Visit (INDEPENDENT_AMBULATORY_CARE_PROVIDER_SITE_OTHER): Payer: No Typology Code available for payment source

## 2012-02-06 DIAGNOSIS — Z7901 Long term (current) use of anticoagulants: Secondary | ICD-10-CM

## 2012-02-06 DIAGNOSIS — I4891 Unspecified atrial fibrillation: Secondary | ICD-10-CM

## 2012-02-06 LAB — POCT INR: INR: 1.7

## 2012-02-18 ENCOUNTER — Other Ambulatory Visit: Payer: Self-pay | Admitting: Internal Medicine

## 2012-02-19 ENCOUNTER — Ambulatory Visit (INDEPENDENT_AMBULATORY_CARE_PROVIDER_SITE_OTHER): Payer: No Typology Code available for payment source

## 2012-02-19 ENCOUNTER — Ambulatory Visit (INDEPENDENT_AMBULATORY_CARE_PROVIDER_SITE_OTHER): Payer: No Typology Code available for payment source | Admitting: Internal Medicine

## 2012-02-19 ENCOUNTER — Encounter: Payer: Self-pay | Admitting: Internal Medicine

## 2012-02-19 ENCOUNTER — Other Ambulatory Visit: Payer: Self-pay

## 2012-02-19 VITALS — BP 141/80 | HR 62 | Resp 18 | Ht 73.0 in | Wt 307.0 lb

## 2012-02-19 DIAGNOSIS — Z7901 Long term (current) use of anticoagulants: Secondary | ICD-10-CM

## 2012-02-19 DIAGNOSIS — I4891 Unspecified atrial fibrillation: Secondary | ICD-10-CM

## 2012-02-19 MED ORDER — AMLODIPINE BESYLATE 5 MG PO TABS: 5.0000 mg | ORAL_TABLET | Freq: Every day | ORAL | Status: DC

## 2012-02-19 NOTE — Patient Instructions (Signed)
Keep Amiodarone at 400mg  every day  BMET today Will call you with results.  Your physician has requested that you have an echocardiogram. Echocardiography is a painless test that uses sound waves to create images of your heart. It provides your doctor with information about the size and shape of your heart and how well your heart's chambers and valves are working. This procedure takes approximately one hour. There are no restrictions for this procedure.  Schedule echo in January 2014.  Return to see Dr.Ross in March.

## 2012-02-19 NOTE — Progress Notes (Signed)
HPI  Patient is a 61 year old with a history of HTN, PAF, CHF and sleep apnea.  I saw him in clinic in September. When he was hosp this summer he was in rapid afib  Diuresed.  Converted to SR  Started on AMio (Long QT  Not a Tikosyn candidate) Lipids in Sept LDL was 96  BNP was 52, BMET was normal  BP at home 150/85   He says his breathing has been good  HE denies PND  No CP  No palpitations (never had)  No edema.  No Known Allergies  Current Outpatient Prescriptions  Medication Sig Dispense Refill  . allopurinol (ZYLOPRIM) 300 MG tablet Take 300 mg by mouth 2 (two) times daily.       [name] amiodarone (PACERONE) 100 MG tablet Take as directed  30 tablet  6  . amiodarone (PACERONE) 400 MG tablet Take 1 tablet (400 mg total) by mouth daily.  30 tablet  3  . amLODipine (NORVASC) 5 MG tablet Take 1 tablet (5 mg total) by mouth daily.  30 tablet  6  . atorvastatin (LIPITOR) 20 MG tablet Take 1 tablet (20 mg total) by mouth daily.  90 tablet  3  . buPROPion (WELLBUTRIN SR) 150 MG 12 hr tablet Take 150 mg by mouth 2 (two) times daily.       . hydrALAZINE (APRESOLINE) 50 MG tablet TAKE 1 TABLET BY MOUTH THREE TIMES A DAY  270 tablet  1  . lisinopril (PRINIVIL,ZESTRIL) 40 MG tablet Take 40 mg by mouth daily.      . metoprolol (LOPRESSOR) 50 MG tablet TAKE 3 TABLETS BY MOUTH TWICE A DAY  540 tablet  3  . NIASPAN 1000 MG CR tablet TAKE 1 TABLET BY MOUTH AT BEDTIME  30 each  2  . spironolactone (ALDACTONE) 25 MG tablet Take one half tab every day  3045 tablet  3  . warfarin (COUMADIN) 5 MG tablet TAKE AS DIRECTED BY COUMADIN CLINIC  40 tablet  3    Past Medical History  Diagnosis Date  . Hypertension   . Dyslipidemia   . Obesity   . Atrial fibrillation   . OSA (obstructive sleep apnea)   . Tobacco abuse   . COPD (chronic obstructive pulmonary disease)   . Shortness of breath   . Asthma     Past Surgical History  Procedure Date  . Total hip arthroplasty 1999  . Cardioversion 2011     Family History  Problem Relation Age of Onset  . Other Father     deceased- unknow reason  . Hypertension Mother   . Lung cancer Brother     History   Social History  . Marital Status: Married    Spouse Name: N/A    Number of Children: 1  . Years of Education: N/A   Occupational History  . 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 Smoking status: Current Every Day Smoker -- 0.5 packs/day for 40 years    Types: Cigarettes  . Smokeless tobacco: Never Used  . Alcohol Use: No  . Drug Use: No  . Sexually Active: Yes   Other Topics Concern  . Not on file   Social History Narrative  . No narrative on file    Review of Systems:  All systems reviewed.  They are negative to the above problem except as previously stated.  Vital Signs: BP 141/80  Pulse 62  Resp 18  Ht 6'  1" (1.854 m)  Wt 307 lb (139.254 kg)  BMI 40.50 kg/m2  Physical Exam Patient is in NAD HEENT:  Normocephalic, atraumatic. EOMI, PERRLA.  Neck: JVP is normal.  No bruits.  Lungs: clear to auscultation. No rales no wheezes.  Heart: Regular rate and rhythm. Normal S1, S2. No S3.   No significant murmurs. PMI not displaced.  Abdomen:  Supple, nontender. Normal bowel sounds. No masses. No hepatomegaly.  Extremities:   Good distal pulses throughout. No lower extremity edema.  Musculoskeletal :moving all extremities.  Neuro:   alert and oriented x3.  CN II-XII grossly intact.  Sinus bradycardia  57 bpm.  LVH.      Assessment and Plan:  1.  Atrial fibrillation  Remains in SR  I would keep on Amio at current dosing for now.  Keep on coumadin  2.  CHF  Volume status looks good  Check BMET  Keep on regimen.  Check echo in Jan  3.  HTN  BP is fair  He says it is higher at home.  Will bring in cuff  4.  HL  COntinue curr lipitor  F/U in clinic in March.

## 2012-02-20 LAB — BASIC METABOLIC PANEL
Calcium: 8.9 mg/dL (ref 8.4–10.5)
Creatinine, Ser: 1.5 mg/dL (ref 0.4–1.5)
GFR: 59.94 mL/min — ABNORMAL LOW (ref >60.00)
Sodium: 139 mEq/L (ref 135–145)

## 2012-02-28 ENCOUNTER — Other Ambulatory Visit (HOSPITAL_COMMUNITY): Payer: Self-pay | Admitting: Cardiology

## 2012-02-28 ENCOUNTER — Telehealth: Payer: Self-pay | Admitting: Internal Medicine

## 2012-02-28 MED ORDER — AMIODARONE HCL 400 MG PO TABS: 400.0000 mg | ORAL_TABLET | Freq: Every day | ORAL | Status: DC

## 2012-02-28 NOTE — Telephone Encounter (Signed)
Refill sent electronically to pharm with larger quantity.

## 2012-02-28 NOTE — Telephone Encounter (Signed)
New Problem:     Called in wanting to change the patients amiodarone (PACERONE) 400 MG tablet refills to 90 day refills.

## 2012-03-11 ENCOUNTER — Ambulatory Visit (INDEPENDENT_AMBULATORY_CARE_PROVIDER_SITE_OTHER): Payer: No Typology Code available for payment source

## 2012-03-11 DIAGNOSIS — I4891 Unspecified atrial fibrillation: Secondary | ICD-10-CM

## 2012-03-11 DIAGNOSIS — Z7901 Long term (current) use of anticoagulants: Secondary | ICD-10-CM

## 2012-03-11 LAB — POCT INR: INR: 2.7

## 2012-03-13 ENCOUNTER — Encounter: Payer: Self-pay | Admitting: Pulmonary Disease

## 2012-03-13 ENCOUNTER — Ambulatory Visit (INDEPENDENT_AMBULATORY_CARE_PROVIDER_SITE_OTHER): Payer: No Typology Code available for payment source | Admitting: Pulmonary Disease

## 2012-03-13 VITALS — BP 142/98 | HR 55 | Temp 97.7°F | Ht 73.0 in | Wt 254.2 lb

## 2012-03-13 DIAGNOSIS — G4733 Obstructive sleep apnea (adult) (pediatric): Secondary | ICD-10-CM

## 2012-03-13 NOTE — Patient Instructions (Addendum)
Will have your pressure optimized again since you have lost considerable weight.  Will call you once I receive the download. Keep working on weight loss.  You are doing great.  If you lose 30 more pounds before next visit, please call me.  followup with me in one year.

## 2012-03-13 NOTE — Assessment & Plan Note (Signed)
The patient is doing very well on CPAP, and has lost 63 pounds since his last visit.  I have congratulated him on this, and encouraged him to keep working toward his goal weight.  Because he has lost so much weight, we need to re\re optimize his pressure, and we'll do this on the automatic setting.  I will call him with the results once I obtain his download.

## 2012-03-13 NOTE — Progress Notes (Signed)
  Subjective:    Patient ID: Jason Russell, male    DOB: [birth year], 61 y.o.   MRN: [id number]  HPI The patient comes in today for followup of his obstructive sleep apnea.  He is wearing CPAP compliantly, and is having no issues with his mask fit or pressure.  He feels that he is sleeping well with the device, and has lost 63 pounds since last visit.   Review of Systems  Constitutional: Negative for fever and unexpected weight change.  HENT: Negative for ear pain, nosebleeds, congestion, sore throat, rhinorrhea, sneezing, trouble swallowing, dental problem, postnasal drip and sinus pressure.   Eyes: Negative for redness and itching.  Respiratory: Negative for cough, chest tightness, shortness of breath and wheezing.   Cardiovascular: Negative for palpitations and leg swelling.  Gastrointestinal: Negative for nausea and vomiting.  Genitourinary: Negative for dysuria.  Musculoskeletal: Negative for joint swelling.  Skin: Negative for rash.  Neurological: Positive for headaches ( brother passed recently-having hard time dealing with).  Hematological: Does not bruise/bleed easily.  Psychiatric/Behavioral: Negative for dysphoric mood ( passing of brother). The patient is not nervous/anxious.        Objective:   Physical Exam Overweight male in no acute distress Nose without purulence or discharge noted No skin breakdown or pressure necrosis from the CPAP mask Lower extremities without edema, cyanosis Alert and oriented, does not appear to be sleepy, moves all 4 extremities.       Assessment & Plan:

## 2012-03-22 ENCOUNTER — Other Ambulatory Visit: Payer: Self-pay | Admitting: Internal Medicine

## 2012-03-22 MED ORDER — LISINOPRIL 40 MG PO TABS: 40.0000 mg | ORAL_TABLET | Freq: Every day | ORAL | Status: DC

## 2012-04-01 ENCOUNTER — Other Ambulatory Visit: Payer: Self-pay | Admitting: Internal Medicine

## 2012-04-03 HISTORY — PX: CARDIAC DEFIBRILLATOR PLACEMENT: SHX171

## 2012-04-04 ENCOUNTER — Other Ambulatory Visit: Payer: Self-pay | Admitting: Internal Medicine

## 2012-04-16 ENCOUNTER — Ambulatory Visit (INDEPENDENT_AMBULATORY_CARE_PROVIDER_SITE_OTHER): Payer: No Typology Code available for payment source | Admitting: *Deleted

## 2012-04-16 DIAGNOSIS — Z7901 Long term (current) use of anticoagulants: Secondary | ICD-10-CM

## 2012-04-16 DIAGNOSIS — I4891 Unspecified atrial fibrillation: Secondary | ICD-10-CM

## 2012-04-16 LAB — POCT INR: INR: 3.2

## 2012-04-22 ENCOUNTER — Other Ambulatory Visit (HOSPITAL_COMMUNITY): Payer: No Typology Code available for payment source

## 2012-04-29 ENCOUNTER — Ambulatory Visit (HOSPITAL_COMMUNITY): Payer: No Typology Code available for payment source | Attending: Internal Medicine | Admitting: Radiology

## 2012-04-29 ENCOUNTER — Other Ambulatory Visit (HOSPITAL_COMMUNITY): Payer: No Typology Code available for payment source

## 2012-04-29 DIAGNOSIS — I4891 Unspecified atrial fibrillation: Secondary | ICD-10-CM

## 2012-04-29 DIAGNOSIS — I10 Essential (primary) hypertension: Secondary | ICD-10-CM | POA: Insufficient documentation

## 2012-04-29 DIAGNOSIS — I509 Heart failure, unspecified: Secondary | ICD-10-CM | POA: Insufficient documentation

## 2012-04-29 DIAGNOSIS — J4489 Other specified chronic obstructive pulmonary disease: Secondary | ICD-10-CM | POA: Insufficient documentation

## 2012-04-29 DIAGNOSIS — J449 Chronic obstructive pulmonary disease, unspecified: Secondary | ICD-10-CM | POA: Insufficient documentation

## 2012-04-29 DIAGNOSIS — I080 Rheumatic disorders of both mitral and aortic valves: Secondary | ICD-10-CM | POA: Insufficient documentation

## 2012-04-29 DIAGNOSIS — G4733 Obstructive sleep apnea (adult) (pediatric): Secondary | ICD-10-CM | POA: Insufficient documentation

## 2012-04-29 NOTE — Progress Notes (Signed)
Echocardiogram performed.  

## 2012-05-03 ENCOUNTER — Other Ambulatory Visit: Payer: Self-pay

## 2012-05-03 MED ORDER — NIACIN ER (ANTIHYPERLIPIDEMIC) 1000 MG PO TBCR: 1000.0000 mg | EXTENDED_RELEASE_TABLET | Freq: Every day | ORAL | Status: DC

## 2012-05-14 ENCOUNTER — Ambulatory Visit (INDEPENDENT_AMBULATORY_CARE_PROVIDER_SITE_OTHER): Payer: No Typology Code available for payment source | Admitting: *Deleted

## 2012-05-14 DIAGNOSIS — Z7901 Long term (current) use of anticoagulants: Secondary | ICD-10-CM

## 2012-05-14 DIAGNOSIS — I4891 Unspecified atrial fibrillation: Secondary | ICD-10-CM

## 2012-05-14 LAB — POCT INR: INR: 2.6

## 2012-05-18 ENCOUNTER — Other Ambulatory Visit: Payer: Self-pay

## 2012-06-17 ENCOUNTER — Ambulatory Visit (INDEPENDENT_AMBULATORY_CARE_PROVIDER_SITE_OTHER): Payer: No Typology Code available for payment source | Admitting: *Deleted

## 2012-06-17 ENCOUNTER — Ambulatory Visit (INDEPENDENT_AMBULATORY_CARE_PROVIDER_SITE_OTHER): Payer: No Typology Code available for payment source | Admitting: Internal Medicine

## 2012-06-17 ENCOUNTER — Encounter: Payer: Self-pay | Admitting: Internal Medicine

## 2012-06-17 VITALS — BP 152/90 | HR 55 | Ht 73.0 in | Wt 306.8 lb

## 2012-06-17 DIAGNOSIS — I4891 Unspecified atrial fibrillation: Secondary | ICD-10-CM

## 2012-06-17 DIAGNOSIS — Z7901 Long term (current) use of anticoagulants: Secondary | ICD-10-CM

## 2012-06-17 LAB — BASIC METABOLIC PANEL
CO2: 28 mEq/L (ref 19–32)
Glucose, Bld: 101 mg/dL — ABNORMAL HIGH (ref 70–99)
Potassium: 3 mEq/L — ABNORMAL LOW (ref 3.5–5.1)
Sodium: 139 mEq/L (ref 135–145)

## 2012-06-17 LAB — CBC WITH DIFFERENTIAL/PLATELET
Basophils Relative: 0.5 % (ref 0.0–3.0)
Eosinophils Relative: 3.5 % (ref 0.0–5.0)
HCT: 39.2 % (ref 39.0–52.0)
Lymphs Abs: 2.2 10*3/uL (ref 0.7–4.0)
Monocytes Relative: 12 % (ref 3.0–12.0)
Neutrophils Relative %: 56.4 % (ref 43.0–77.0)
Platelets: 203 10*3/uL (ref 150.0–400.0)
RBC: 4.25 Mil/uL (ref 4.22–5.81)
WBC: 8 10*3/uL (ref 4.5–10.5)

## 2012-06-17 LAB — PROTIME-INR: INR: 2.5 ratio — ABNORMAL HIGH (ref 0.8–1.0)

## 2012-06-17 MED ORDER — CARVEDILOL 25 MG PO TABS: 25.0000 mg | ORAL_TABLET | Freq: Two times a day (BID) | ORAL | Status: DC

## 2012-06-17 NOTE — Patient Instructions (Signed)
PRE CATH LABS TODAY.  I will call you tomorrow with the details of your cath.  Stop taking Metoprolol.  Start Coreg 25mg  twice daily.

## 2012-06-17 NOTE — Progress Notes (Signed)
HPI Patient is a 61 year old with a history of HTN, PAF, CHF and sleep apnea. I saw him in clinic in September.  When he was hosp this summer he was in rapid afib Diuresed. Converted to SR Started on AMio (Long QT Not a Tikosyn candidate)  LV function was down  QUestion secondary to rapid afib with tachy induced cardiomyopathy  The patient had an echo remotely (also when in afib)  LV function was down then  No cath done.  The patient had done well when in SR  Lipids in Sept LDL was 96 BNP was 52, BMET was normal   Since d/c his breathing has been OK  Walks  No CP  No palpitations.  No signif edema. Echo in January showed LVEF was 30 to 35%  A little better than in July.     No Known Allergies  Current Outpatient Prescriptions  Medication Sig Dispense Refill  . allopurinol (ZYLOPRIM) 300 MG tablet Take 300 mg by mouth 2 (two) times daily.       . amiodarone (PACERONE) 400 MG tablet Take 1 tablet (400 mg total) by mouth daily.  90 tablet  4  . amLODipine (NORVASC) 5 MG tablet Take 1 tablet (5 mg total) by mouth daily.  90 tablet  3  . atorvastatin (LIPITOR) 20 MG tablet Take 1 tablet (20 mg total) by mouth daily.  90 tablet  3  . buPROPion (WELLBUTRIN SR) 150 MG 12 hr tablet Take 150 mg by mouth 2 (two) times daily.       . hydrALAZINE (APRESOLINE) 50 MG tablet TAKE 1 TABLET BY MOUTH THREE TIMES A DAY  270 tablet  1  . lisinopril (PRINIVIL,ZESTRIL) 40 MG tablet Take 1 tablet (40 mg total) by mouth daily.  90 tablet  3  . metoprolol (LOPRESSOR) 50 MG tablet TAKE 3 TABLETS BY MOUTH TWICE A DAY  540 tablet  3  . niacin (NIASPAN) 1000 MG CR tablet Take 1 tablet (1,000 mg total) by mouth at bedtime.  90 tablet  3  . spironolactone (ALDACTONE) 25 MG tablet Take one half tab every day  3045 tablet  3  . warfarin (COUMADIN) 5 MG tablet TAKE AS DIRECTED BY COUMADIN CLINIC  40 tablet  3   No current facility-administered medications for this visit.    Past Medical History  Diagnosis Date  .  Hypertension   . Dyslipidemia   . Obesity   . Atrial fibrillation   . OSA (obstructive sleep apnea)   . Tobacco abuse   . COPD (chronic obstructive pulmonary disease)   . Shortness of breath   . Asthma     Past Surgical History  Procedure Laterality Date  . Total hip arthroplasty  1999  . Cardioversion  2011    Family History  Problem Relation Age of Onset  . Other Father     deceased- unknow reason  . Hypertension Mother   . Lung cancer Brother     History   Social History  . Marital Status: Married    Spouse Name: N/A    Number of Children: 1  . Years of Education: N/A   Occupational History  . 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 Smoking status: Current Every Day Smoker -- 0.50 packs/day for 40 years    Types: Cigarettes  . Smokeless tobacco: Never Used     Comment: cutting back to 4-5 cigs daily  . Alcohol Use: No  .   Drug Use: No  . Sexually Active: Yes   Other Topics Concern  . Not on file   Social History Narrative  . No narrative on file    Review of Systems:  All systems reviewed.  They are negative to the above problem except as previously stated.  Vital Signs: BP 152/90  Pulse 55  Ht 6' 1" (1.854 m)  Wt 306 lb 12.8 oz (139.164 kg)  BMI 40.49 kg/m2  SpO2 98%  Physical Exam Patient is an obese 61 yo in NAD HEENT:  Normocephalic, atraumatic. EOMI, PERRLA.  Neck: JVP is normal.  No bruits.  Lungs: clear to auscultation. No rales no wheezes.  Heart: Regular rate and rhythm. Normal S1, S2. No S3.   No significant murmurs. PMI not displaced.  Abdomen:  Supple, nontender. Normal bowel sounds. No masses. No hepatomegaly.  Extremities:   Good distal pulses throughout. No lower extremity edema.  Musculoskeletal :moving all extremities.  Neuro:   alert and oriented x3.  CN II-XII grossly intact.  EKG  SB 55.  Nonspecific ST T wave changes. Assessment and Plan:  1.  Atrial fibrillation.  Patinet remains in SR.  I  would recomm he begin a taper soon of amio to 200 qd.  Keep on coumadin     2.  Cardiomyopathy.  The patient has never had a cath.  Without improvement in LV function with resumption of SR it is important to document.  If neg would cointue medical Rx.   Would also refer to EP for consideration of ICD.   Discussed procedure with patient  He is agreeable to proceed.  Keep on amio 400 for now.  Review labs  3.  HTN  Still difficult  Continue meds for now.  INcrease amlodipine to 10  Switch metoprolol to coreg 25 bid  Follow.  4.  HL  Keep on meds.  

## 2012-06-18 ENCOUNTER — Other Ambulatory Visit: Payer: Self-pay | Admitting: *Deleted

## 2012-06-18 DIAGNOSIS — I10 Essential (primary) hypertension: Secondary | ICD-10-CM

## 2012-06-21 ENCOUNTER — Other Ambulatory Visit (INDEPENDENT_AMBULATORY_CARE_PROVIDER_SITE_OTHER): Payer: No Typology Code available for payment source

## 2012-06-21 DIAGNOSIS — I10 Essential (primary) hypertension: Secondary | ICD-10-CM

## 2012-06-21 LAB — BASIC METABOLIC PANEL
CO2: 25 mEq/L (ref 19–32)
Chloride: 108 mEq/L (ref 96–112)
Sodium: 139 mEq/L (ref 135–145)

## 2012-07-10 ENCOUNTER — Encounter: Payer: Self-pay | Admitting: *Deleted

## 2012-07-10 ENCOUNTER — Other Ambulatory Visit: Payer: Self-pay | Admitting: *Deleted

## 2012-07-10 DIAGNOSIS — I4891 Unspecified atrial fibrillation: Secondary | ICD-10-CM

## 2012-07-12 ENCOUNTER — Other Ambulatory Visit (INDEPENDENT_AMBULATORY_CARE_PROVIDER_SITE_OTHER): Payer: No Typology Code available for payment source

## 2012-07-12 DIAGNOSIS — I4891 Unspecified atrial fibrillation: Secondary | ICD-10-CM

## 2012-07-12 LAB — CBC WITH DIFFERENTIAL/PLATELET
Basophils Relative: 1.8 % (ref 0.0–3.0)
Eosinophils Absolute: 0.2 10*3/uL (ref 0.0–0.7)
MCHC: 33.2 g/dL (ref 30.0–36.0)
MCV: 92.6 fl (ref 78.0–100.0)
Monocytes Absolute: 0.7 10*3/uL (ref 0.1–1.0)
Neutrophils Relative %: 59.7 % (ref 43.0–77.0)
RBC: 3.72 Mil/uL — ABNORMAL LOW (ref 4.22–5.81)
RDW: 17 % — ABNORMAL HIGH (ref 11.5–14.6)

## 2012-07-12 LAB — PROTIME-INR: Prothrombin Time: 31.3 s — ABNORMAL HIGH (ref 10.2–12.4)

## 2012-07-12 LAB — BASIC METABOLIC PANEL
BUN: 19 mg/dL (ref 6–23)
Creatinine, Ser: 1.3 mg/dL (ref 0.4–1.5)
GFR: 73.55 mL/min (ref >60.00)
Potassium: 3.2 mEq/L — ABNORMAL LOW (ref 3.5–5.1)

## 2012-07-15 ENCOUNTER — Encounter (HOSPITAL_BASED_OUTPATIENT_CLINIC_OR_DEPARTMENT_OTHER): Payer: Self-pay | Admitting: *Deleted

## 2012-07-15 ENCOUNTER — Encounter (HOSPITAL_BASED_OUTPATIENT_CLINIC_OR_DEPARTMENT_OTHER)
Admission: RE | Disposition: A | Discharge status: Home / Self Care | Payer: Self-pay | Source: Ambulatory Visit | Attending: Cardiology

## 2012-07-15 ENCOUNTER — Inpatient Hospital Stay (HOSPITAL_BASED_OUTPATIENT_CLINIC_OR_DEPARTMENT_OTHER)
Admission: RE | Admit: 2012-07-15 | Discharge: 2012-07-15 | Disposition: A | Discharge status: Home / Self Care | Payer: No Typology Code available for payment source | Source: Ambulatory Visit | Attending: Cardiology | Admitting: Cardiology

## 2012-07-15 DIAGNOSIS — I509 Heart failure, unspecified: Secondary | ICD-10-CM | POA: Insufficient documentation

## 2012-07-15 DIAGNOSIS — I10 Essential (primary) hypertension: Secondary | ICD-10-CM | POA: Insufficient documentation

## 2012-07-15 DIAGNOSIS — J4489 Other specified chronic obstructive pulmonary disease: Secondary | ICD-10-CM | POA: Insufficient documentation

## 2012-07-15 DIAGNOSIS — J449 Chronic obstructive pulmonary disease, unspecified: Secondary | ICD-10-CM | POA: Insufficient documentation

## 2012-07-15 DIAGNOSIS — I428 Other cardiomyopathies: Secondary | ICD-10-CM

## 2012-07-15 DIAGNOSIS — I4891 Unspecified atrial fibrillation: Secondary | ICD-10-CM | POA: Insufficient documentation

## 2012-07-15 DIAGNOSIS — G4733 Obstructive sleep apnea (adult) (pediatric): Secondary | ICD-10-CM | POA: Insufficient documentation

## 2012-07-15 LAB — PROTIME-INR: Prothrombin Time: 16.8 seconds — ABNORMAL HIGH (ref 11.6–15.2)

## 2012-07-15 SURGERY — JV LEFT HEART CATHETERIZATION WITH CORONARY ANGIOGRAM

## 2012-07-15 MED ORDER — SODIUM CHLORIDE 0.9 % IV SOLN: INTRAVENOUS | Status: DC

## 2012-07-15 MED ORDER — ACETAMINOPHEN 325 MG PO TABS: 650.0000 mg | ORAL_TABLET | ORAL | Status: DC | PRN

## 2012-07-15 MED ORDER — SODIUM CHLORIDE 0.9 % IV SOLN
INTRAVENOUS | Status: DC
  Administered 2012-07-15: 10:00:00 via INTRAVENOUS

## 2012-07-15 MED ORDER — ONDANSETRON HCL 4 MG/2ML IJ SOLN: 4.0000 mg | Freq: Four times a day (QID) | INTRAMUSCULAR | Status: DC | PRN

## 2012-07-15 NOTE — Progress Notes (Signed)
Received form cath lab, alert and oriented with TR band on right radial.  No problems at site.  Tolerated fluids wells.  Dr Golden Circle in to talk to pt and family about the results of test and plan of care.

## 2012-07-15 NOTE — H&P (View-Only) (Signed)
HPI Patient is a 62 year old with a history of HTN, PAF, CHF and sleep apnea. I saw him in clinic in September.  When he was hosp this summer he was in rapid afib Diuresed. Converted to SR Started on [employer] (Long QT Not a Tikosyn candidate)  LV function was down  QUestion secondary to rapid afib with tachy induced cardiomyopathy  The patient had an echo remotely (also when in afib)  LV function was down then  No cath done.  The patient had done well when in SR  Lipids in Sept LDL was 96 BNP was 52, BMET was normal   Since d/c his breathing has been OK  Walks  No CP  No palpitations.  No signif edema. Echo in January showed LVEF was 30 to 35%  A little better than in July.     No Known Allergies  Current Outpatient Prescriptions  Medication Sig Dispense Refill  . allopurinol (ZYLOPRIM) 300 MG tablet Take 300 mg by mouth 2 (two) times daily.       [name] amiodarone (PACERONE) 400 MG tablet Take 1 tablet (400 mg total) by mouth daily.  90 tablet  4  . amLODipine (NORVASC) 5 MG tablet Take 1 tablet (5 mg total) by mouth daily.  90 tablet  3  . atorvastatin (LIPITOR) 20 MG tablet Take 1 tablet (20 mg total) by mouth daily.  90 tablet  3  . buPROPion (WELLBUTRIN SR) 150 MG 12 hr tablet Take 150 mg by mouth 2 (two) times daily.       . hydrALAZINE (APRESOLINE) 50 MG tablet TAKE 1 TABLET BY MOUTH THREE TIMES A DAY  270 tablet  1  . lisinopril (PRINIVIL,ZESTRIL) 40 MG tablet Take 1 tablet (40 mg total) by mouth daily.  90 tablet  3  . metoprolol (LOPRESSOR) 50 MG tablet TAKE 3 TABLETS BY MOUTH TWICE A DAY  540 tablet  3  . niacin (NIASPAN) 1000 MG CR tablet Take 1 tablet (1,000 mg total) by mouth at bedtime.  90 tablet  3  . spironolactone (ALDACTONE) 25 MG tablet Take one half tab every day  3045 tablet  3  . warfarin (COUMADIN) 5 MG tablet TAKE AS DIRECTED BY COUMADIN CLINIC  40 tablet  3   No current facility-administered medications for this visit.    Past Medical History  Diagnosis Date  .  Hypertension   . Dyslipidemia   . Obesity   . Atrial fibrillation   . OSA (obstructive sleep apnea)   . Tobacco abuse   . COPD (chronic obstructive pulmonary disease)   . Shortness of breath   . Asthma     Past Surgical History  Procedure Laterality Date  . Total hip arthroplasty  1999  . Cardioversion  2011    Family History  Problem Relation Age of Onset  . Other Father     deceased- unknow reason  . Hypertension Mother   . Lung cancer Brother     History   Social History  . Marital Status: Married    Spouse Name: N/A    Number of Children: 1  . Years of Education: N/A   Occupational History  . 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 Smoking status: Current Every Day Smoker -- 0.50 packs/day for 40 years    Types: Cigarettes  . Smokeless tobacco: Never Used     Comment: cutting back to 4-5 cigs daily  . Alcohol Use: No  .  Drug Use: No  . Sexually Active: Yes   Other Topics Concern  . Not on file   Social History Narrative  . No narrative on file    Review of Systems:  All systems reviewed.  They are negative to the above problem except as previously stated.  Vital Signs: BP 152/90  Pulse 55  Ht 6\' 1"  (1.854 m)  Wt 306 lb 12.8 oz (139.164 kg)  BMI 40.49 kg/m2  SpO2 98%  Physical Exam Patient is an obese 62 yo in NAD HEENT:  Normocephalic, atraumatic. EOMI, PERRLA.  Neck: JVP is normal.  No bruits.  Lungs: clear to auscultation. No rales no wheezes.  Heart: Regular rate and rhythm. Normal S1, S2. No S3.   No significant murmurs. PMI not displaced.  Abdomen:  Supple, nontender. Normal bowel sounds. No masses. No hepatomegaly.  Extremities:   Good distal pulses throughout. No lower extremity edema.  Musculoskeletal :moving all extremities.  Neuro:   alert and oriented x3.  CN II-XII grossly intact.  EKG  SB 55.  Nonspecific ST T wave changes. Assessment and Plan:  1.  Atrial fibrillation.  Patinet remains in SR.  I  would recomm he begin a taper soon of amio to 200 qd.  Keep on coumadin     2.  Cardiomyopathy.  The patient has never had a cath.  Without improvement in LV function with resumption of SR it is important to document.  If neg would cointue medical Rx.   Would also refer to EP for consideration of ICD.   Discussed procedure with patient  He is agreeable to proceed.  Keep on amio 400 for now.  Review labs  3.  HTN  Still difficult  Continue meds for now.  INcrease amlodipine to 10  Switch metoprolol to coreg 25 bid  Follow.  4.  HL  Keep on meds.

## 2012-07-15 NOTE — CV Procedure (Signed)
   Cardiac Catheterization Procedure Note  Name: Jason Russell MRN: [id number] DOB: [birth year]  Procedure: Left Heart Cath, Selective Coronary Angiography  Indication: Cardiomyopathy   Procedural Details: Allen's Test was positive.  The right wrist was prepped, draped, and anesthetized with 1% lidocaine. Using the modified Seldinger technique, a 5 French sheath was introduced into the right radial artery. 3 mg of verapamil was administered through the sheath, weight-based unfractionated heparin was administered intravenously. Procedure was difficult due to tortuosity in the brachiocephalic artery making engagement of the right coronary challenging due to difficulty torqueing the catheter.  Standard Judkins catheters were used for selective coronary angiography. Catheter exchanges were performed over an exchange length guidewire. There were no immediate procedural complications. A TR band was used for radial hemostasis at the completion of the procedure.  The patient was transferred to the post catheterization recovery area for further monitoring.  Procedural Findings: Hemodynamics: AO 158/78 LV 159/24  Coronary angiography: Coronary dominance: right  Left mainstem: Short, no significant disease.   Left anterior descending (LAD): Luminal irregularities.   Left circumflex (LCx): No significant CAD.   Right coronary artery (RCA): Difficult to engage from right radial access due to tortuosity of the brachiocephalic artery and difficulty torquing catheter.  The artery was engaged subselectively, no obstructive coronary disease was noted.   Left ventriculography: Not done due to contrast used to find the RCA.  Final Conclusions:  No obstructive CAD.  If needs catheterization in the future, likely should go from the right femoral.  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   Marca Ancona 07/15/2012, 10:31 AM

## 2012-07-15 NOTE — Interval H&P Note (Signed)
History and Physical Interval Note:  07/15/2012 9:33 AM  Jason Russell  has presented today for surgery, with the diagnosis of cp  The various methods of treatment have been discussed with the patient and family. After consideration of risks, benefits and other options for treatment, the patient has consented to  Procedure(s): JV LEFT HEART CATHETERIZATION WITH CORONARY ANGIOGRAM (N/A) as a surgical intervention .  The patient's history has been reviewed, patient examined, no change in status, stable for surgery.  I have reviewed the patient's chart and labs.  Questions were answered to the patient's satisfaction.     Melony Tenpas [employer]

## 2012-07-19 ENCOUNTER — Ambulatory Visit (INDEPENDENT_AMBULATORY_CARE_PROVIDER_SITE_OTHER): Payer: No Typology Code available for payment source | Admitting: *Deleted

## 2012-07-19 DIAGNOSIS — I4891 Unspecified atrial fibrillation: Secondary | ICD-10-CM

## 2012-07-19 DIAGNOSIS — Z7901 Long term (current) use of anticoagulants: Secondary | ICD-10-CM

## 2012-07-19 LAB — POCT INR: INR: 2

## 2012-07-25 ENCOUNTER — Ambulatory Visit (INDEPENDENT_AMBULATORY_CARE_PROVIDER_SITE_OTHER): Payer: No Typology Code available for payment source | Admitting: Physician Assistant

## 2012-07-25 ENCOUNTER — Encounter: Payer: Self-pay | Admitting: *Deleted

## 2012-07-25 ENCOUNTER — Encounter: Payer: Self-pay | Admitting: Physician Assistant

## 2012-07-25 VITALS — BP 132/84 | HR 61 | Ht 73.0 in | Wt 308.0 lb

## 2012-07-25 DIAGNOSIS — E876 Hypokalemia: Secondary | ICD-10-CM

## 2012-07-25 DIAGNOSIS — I10 Essential (primary) hypertension: Secondary | ICD-10-CM

## 2012-07-25 DIAGNOSIS — I4891 Unspecified atrial fibrillation: Secondary | ICD-10-CM

## 2012-07-25 DIAGNOSIS — E785 Hyperlipidemia, unspecified: Secondary | ICD-10-CM

## 2012-07-25 DIAGNOSIS — I5022 Chronic systolic (congestive) heart failure: Secondary | ICD-10-CM

## 2012-07-25 DIAGNOSIS — I428 Other cardiomyopathies: Secondary | ICD-10-CM

## 2012-07-25 DIAGNOSIS — Z9229 Personal history of other drug therapy: Secondary | ICD-10-CM

## 2012-07-25 LAB — BASIC METABOLIC PANEL
BUN: 21 mg/dL (ref 6–23)
Chloride: 105 mEq/L (ref 96–112)
Glucose, Bld: 97 mg/dL (ref 70–99)
Potassium: 4 mEq/L (ref 3.5–5.1)
Sodium: 138 mEq/L (ref 135–145)

## 2012-07-25 LAB — HEPATIC FUNCTION PANEL
ALT: 30 U/L (ref 0–53)
AST: 26 U/L (ref 0–37)
Albumin: 3.9 g/dL (ref 3.5–5.2)
Alkaline Phosphatase: 117 U/L (ref 39–117)

## 2012-07-25 NOTE — Patient Instructions (Signed)
Refer to Electrophysiology to discuss possible defibrillator implantation.  Follow up with Dr. Dietrich Pates in 3 months.

## 2012-07-25 NOTE — Progress Notes (Signed)
[address], [address] [place], [place]  [address] Phone: [phone] Fax:  [phone]  Date:  07/25/2012   ID:  Jason Russell, DOB [birth year], MRN [id number]  PCP:  Garth Schlatter, MD  Primary Cardiologist:  Dr. Dietrich Pates     History of Present Illness: Jason Russell is a 62 y.o. male who returns for follow up after recent heart cath. He has a hx of HTN, sleep apnea, parox AFib, systolic CHF and dilated CM.  He was converted to NSR with Amiodarone (not a Tikosyn candidate with prolonged QT).  EF was thought to down due to tachycardia in rapid AFib.  Echo 7/13:  EF 25%.  Follow up echo 04/2012:  Mild LVH, EF 30-35%, Gr 1 DD, mild AI, mild MR, mild LAE.  Without full recovery of LVF, he was referred for cardiac cath.  LHC 07/15/12:  No obstructive CAD.  He has a NICM.  He is doing well.  The patient denies chest pain, shortness of breath, syncope, orthopnea, PND or significant pedal edema.  He is NYHA Class II.    Labs (7/13):  TSH 1.294 Labs (8/13):  ALT 32 Labs (4/14):  K 3.2, Cr 1.3, Hgb 11.4  Wt Readings from Last 3 Encounters:  07/25/12 308 lb (139.708 kg)  06/17/12 306 lb 12.8 oz (139.164 kg)  03/13/12 254 lb 3.2 oz (115.304 kg)     Past Medical History  Diagnosis Date  . Hypertension   . Dyslipidemia   . Obesity   . Atrial fibrillation     Amiodarone started 10/2011; Coumadin  . OSA (obstructive sleep apnea)   . Tobacco abuse   . COPD (chronic obstructive pulmonary disease)   . Asthma   . NICM (nonischemic cardiomyopathy)     LHC 4/14:  no CAD  . Chronic systolic heart failure     a. Echo 7/13: EF 25%;  b. echo 04/2012:  Mild LVH, EF 30-35%, Gr 1 DD, mild AI, mild MR, mild LAE    Current Outpatient Prescriptions  Medication Sig Dispense Refill  . allopurinol (ZYLOPRIM) 300 MG tablet Take 300 mg by mouth 2 (two) times daily.       [name] amiodarone (PACERONE) 400 MG tablet Take 1 tablet (400 mg total) by mouth daily.  90 tablet  4  . amLODipine  (NORVASC) 5 MG tablet Take 1 tablet (5 mg total) by mouth daily.  90 tablet  3  . atorvastatin (LIPITOR) 20 MG tablet Take 1 tablet (20 mg total) by mouth daily.  90 tablet  3  . buPROPion (WELLBUTRIN SR) 150 MG 12 hr tablet Take 150 mg by mouth 2 (two) times daily.       . carvedilol (COREG) 25 MG tablet Take 1 tablet (25 mg total) by mouth 2 (two) times daily.  180 tablet  3  . hydrALAZINE (APRESOLINE) 50 MG tablet Take 50 mg by mouth 3 (three) times daily.      [name] KLOR-CON M20 20 MEQ tablet Take 20 mEq by mouth 2 (two) times daily.       [name] lisinopril (PRINIVIL,ZESTRIL) 40 MG tablet Take 1 tablet (40 mg total) by mouth daily.  90 tablet  3  . niacin (NIASPAN) 1000 MG CR tablet Take 1 tablet (1,000 mg total) by mouth at bedtime.  90 tablet  3  . spironolactone (ALDACTONE) 25 MG tablet Take one half tab every day  3045 tablet  3  . warfarin (COUMADIN) 5 MG tablet TAKE AS DIRECTED  BY COUMADIN CLINIC  40 tablet  3   No current facility-administered medications for this visit.    Allergies:   No Known Allergies  Social History:  The patient  reports that he has been smoking Cigarettes.  He has a 20 pack-year smoking history. He has never used smokeless tobacco. He reports that he does not drink alcohol or use illicit drugs.   ROS:  Please see the history of present illness.    All other systems reviewed and negative.   PHYSICAL EXAM: VS:  BP 132/84  Pulse 61  Ht 6\' 1"  (1.854 m)  Wt 308 lb (139.708 kg)  BMI 40.64 kg/m2 Well nourished, well developed, in no acute distress HEENT: normal Neck: no JVD Cardiac:  normal S1, S2; RRR; no murmur Lungs:  clear to auscultation bilaterally, no wheezing, rhonchi or rales Abd: soft, nontender, no hepatomegaly Ext: no edema; right wrist without hematoma or bruit  Skin: warm and dry Neuro:  CNs 2-12 intact, no focal abnormalities noted  EKG:  NSR, HR 61, IVCD, no change from prior tracing     ASSESSMENT AND PLAN:  1. NICM:  No angiographic CAD on  cath.  He takes PDE-5 inhibitors.  So he is not on nitrates in addition to hydralazine.  Continue current Rx.  EF < 35%.  Refer to EP for possible ICD. 2. Chronic Systolic CHF:  Volume stable.  Continue beta blocker, ACE, spironolactone, hydralazine.  K+ low before cath.  Check BMET today. 3. Atrial Fibrillation:  Maintaining NSR.  Continue coumadin.  Reviewed with Dr. Dietrich Pates. Recommend continuing same dose of amiodarone for now.  Check TSH and LFTs today. 4. Hypertension:  Controlled.  Continue current therapy. 5. Hyperlipidemia:  Continue statin.   6. Disposition:  I will complete FMLA forms for him.  Refer to EP.  F/u with Dr. Dietrich Pates in 3 mos.    Luna Glasgow, PA-C  12:55 PM 07/25/2012

## 2012-08-16 ENCOUNTER — Ambulatory Visit (INDEPENDENT_AMBULATORY_CARE_PROVIDER_SITE_OTHER): Payer: No Typology Code available for payment source

## 2012-08-16 ENCOUNTER — Encounter: Payer: Self-pay | Admitting: Internal Medicine

## 2012-08-16 ENCOUNTER — Ambulatory Visit (INDEPENDENT_AMBULATORY_CARE_PROVIDER_SITE_OTHER): Payer: No Typology Code available for payment source | Admitting: Internal Medicine

## 2012-08-16 VITALS — BP 138/72 | HR 59 | Ht 73.0 in | Wt 308.0 lb

## 2012-08-16 DIAGNOSIS — I428 Other cardiomyopathies: Secondary | ICD-10-CM

## 2012-08-16 DIAGNOSIS — I429 Cardiomyopathy, unspecified: Secondary | ICD-10-CM | POA: Insufficient documentation

## 2012-08-16 DIAGNOSIS — I5022 Chronic systolic (congestive) heart failure: Secondary | ICD-10-CM

## 2012-08-16 DIAGNOSIS — Z7901 Long term (current) use of anticoagulants: Secondary | ICD-10-CM

## 2012-08-16 DIAGNOSIS — I4891 Unspecified atrial fibrillation: Secondary | ICD-10-CM

## 2012-08-16 LAB — POCT INR: INR: 3

## 2012-08-16 NOTE — Assessment & Plan Note (Signed)
The patient has stable class II heart failure. He is on maximal medical therapy and has persistent left ventricular dysfunction he is euvolemic.

## 2012-08-16 NOTE — Progress Notes (Addendum)
ELECTROPHYSIOLOGY CONSULT NOTE  Patient ID: Jason Russell, MRN: [id number], DOB/AGE: [birth year] 62 y.o. Admit date: (Not on file) Date of Consult: 08/16/2012  Primary Physician: Garth Schlatter, MD Primary Cardiologist:  PR  Chief Complaint:  ICD   Jason Russell is a 62 y.o. male   Seen for consideration of ICD implantation. There is a nonischemic cardiomyopathy with a negative catheterization April 2014.  He has a history of atrial fibrillation associated with amiodarone and I think is secondary to QT prolongation. Ejection fraction was first noted to be depressed July 2013  25%. Followup echo January 2014 demonstrated ejection fraction of 35-35%.    He has mild exercise intolerance.he is mildly short of breath with climbing an incline but says he can climb a flight of stairs. He denies syncope or presyncope. He did have one episode of tachypalpitations that lasted a few minutes. He does not have orthopnea nocturnal dyspnea or edema..    Past Medical History  Diagnosis Date  . Hypertension   . Dyslipidemia   . Obesity   . Atrial fibrillation     Amiodarone started 10/2011; Coumadin  . OSA (obstructive sleep apnea)   . Tobacco abuse   . COPD (chronic obstructive pulmonary disease)   . Asthma   . NICM (nonischemic cardiomyopathy)     LHC 4/14:  no CAD  . Chronic systolic heart failure     a. Echo 7/13: EF 25%;  b. echo 04/2012:  Mild LVH, EF 30-35%, Gr 1 DD, mild AI, mild MR, mild LAE      Surgical History:  Past Surgical History  Procedure Laterality Date  . Total hip arthroplasty  1999  . Cardioversion  2011     Home Meds: Prior to Admission medications   Medication Sig Start Date End Date Taking? Authorizing Provider  allopurinol (ZYLOPRIM) 300 MG tablet Take 300 mg by mouth 2 (two) times daily.  12/18/10  Yes Historical Provider, MD  amiodarone (PACERONE) 400 MG tablet Take 1 tablet (400 mg total) by mouth daily. 02/28/12  Yes Pricilla Riffle, MD  amLODipine  (NORVASC) 5 MG tablet Take 1 tablet (5 mg total) by mouth daily. 02/19/12 02/18/13 Yes Pricilla Riffle, MD  atorvastatin (LIPITOR) 20 MG tablet Take 1 tablet (20 mg total) by mouth daily. 02/01/12  Yes Pricilla Riffle, MD  buPROPion [facility] SR) 150 MG 12 hr tablet Take 150 mg by mouth 2 (two) times daily.  11/16/10  Yes Historical Provider, MD  carvedilol (COREG) 25 MG tablet Take 1 tablet (25 mg total) by mouth 2 (two) times daily. 06/17/12  Yes Pricilla Riffle, MD  hydrALAZINE (APRESOLINE) 50 MG tablet Take 50 mg by mouth 3 (three) times daily.   Yes Historical Provider, MD  KLOR-CON M20 20 MEQ tablet Take 20 mEq by mouth 2 (two) times daily.  07/13/12  Yes Historical Provider, MD  lisinopril (PRINIVIL,ZESTRIL) 40 MG tablet Take 1 tablet (40 mg total) by mouth daily. 03/22/12  Yes Pricilla Riffle, MD  niacin (NIASPAN) 1000 MG CR tablet Take 1 tablet (1,000 mg total) by mouth at bedtime. 05/03/12  Yes Pricilla Riffle, MD  spironolactone (ALDACTONE) 25 MG tablet Take one half tab every day 02/01/12  Yes Pricilla Riffle, MD  warfarin (COUMADIN) 5 MG tablet TAKE AS DIRECTED BY COUMADIN CLINIC 02/18/12  Yes Pricilla Riffle, MD      Allergies: No Known Allergies  History   Social History  . Marital Status: Married  Spouse Name: N/A    Number of Children: 1  . Years of Education: N/A   Occupational History  . 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 Smoking status: Current Every Day Smoker -- 0.50 packs/day for 40 years    Types: Cigarettes  . Smokeless tobacco: Never Used     Comment: cutting back to 4-5 cigs daily  . Alcohol Use: No  . Drug Use: No  . Sexually Active: Yes   Other Topics Concern  . Not on file   Social History Narrative  . No narrative on file     Family History  Problem Relation Age of Onset  . Other Father     deceased- unknow reason  . Hypertension Mother   . Lung cancer Brother      ROS:  Please see the history of present illness.     All other  systems reviewed and negative.    Physical Exam:   Blood pressure 138/72, pulse 59, height 6\' 1"  (1.854 m), weight 308 lb (139.708 kg). General: Well developed, well nourished obese age appearing african [place] male in no acute distress. Head: Normocephalic, atraumatic, sclera non-icteric, no xanthomas, nares are without discharge. EENT: normal Lymph Nodes:  none Back: without scoliosis/kyphosis , no CVA tendersness Neck: Negative for carotid bruits. JVD not elevated. Lungs: Clear bilaterally to auscultation without wheezes, rales, or rhonchi. Breathing is unlabored. Heart: RRR with S1 S2. 2/6 murmur , +s4 Abdomen: Soft, non-tender, non-distended with normoactive bowel sounds. No hepatomegaly. No rebound/guarding. No obvious abdominal masses. Msk:  Strength and tone appear normal for age. Extremities: No clubbing or cyanosis. No  edema.  Distal pedal pulses are 2+ and equal bilaterally. Skin: Warm and Dry Neuro: Alert and oriented X 3. CN III-XII intact Grossly normal sensory and motor function . Psych:  Responds to questions appropriately with a normal affect.      Labs: Cardiac Enzymes No results found for this basename: CKTOTAL, CKMB, TROPONINI,  in the last 72 hours CBC Lab Results  Component Value Date   WBC 5.0 07/12/2012   HGB 11.4* 07/12/2012   HCT 34.4* 07/12/2012   MCV 92.6 07/12/2012   PLT 159.0 07/12/2012   PROTIME:  Recent Labs  08/16/12 1455  INR 3.0   Chemistry No results found for this basename: NA, K, CL, CO2, BUN, CREATININE, CALCIUM, LABALBU, PROT, BILITOT, ALKPHOS, ALT, AST, GLUCOSE,  in the last 168 hours Lipids Lab Results  Component Value Date   CHOL 156 12/13/2011   HDL 35.40* 12/13/2011   LDLCALC 96 12/13/2011   TRIG 121.0 12/13/2011   BNP Pro B Natriuretic peptide (BNP)  Date/Time Value Range Status  12/13/2011  1:55 PM 52.0  0.0 - 100.0 pg/mL Final  11/23/2011 10:22 AM 69.0  0.0 - 100.0 pg/mL Final  11/20/2011 10:08 AM 166.0* 0.0 - 100.0 pg/mL  Final  11/06/2011  9:32 AM 3351.0* 0 - 125 pg/mL Final   Miscellaneous No results found for this basename: DDIMER    Radiology/Studies:  No results found.  EKG:  Sinus rhythm at 59 intervals 18/10/45 Axis leftward- 30  Assessment and Plan:    Sherryl Manges

## 2012-08-16 NOTE — Assessment & Plan Note (Signed)
The patient has a nonischemic cardiomyopathy. Notably his mother had a cardiomyopathy and his younger brother died of cardiomyopathy. He has persistent left ventricular dysfunction on maximal medical therapy is appropriately considered for ICD. His heart rate is relatively slow but he is also on amiodarone 400 mg a day. We'll decrease his amiodarone from 400-- 200 mg a day. We have discussed S. ICD versus a standard transvenous ICD. He would like to be screened for the S. ICD I have told her that there is about 85% chance that he would be a screening success and 90-95% chance that he would be implant success We discussed. Risks and benefits including but not limited to perforation of heart   and or lung in lead dislodgment  As well as infection.

## 2012-08-16 NOTE — Patient Instructions (Addendum)
Decrease your Amiodarone to 200mg  daily.  This would be 1/2 tablet daily  Screening for subcutaneous ICD. We will call you to set up an appointment

## 2012-08-21 ENCOUNTER — Telehealth: Payer: Self-pay | Admitting: *Deleted

## 2012-08-21 NOTE — Telephone Encounter (Signed)
Left message for pt to call, pt needs to be scheduled for screening for new sq icd.

## 2012-08-23 NOTE — Telephone Encounter (Signed)
Spoke with pt, he will come next week for the screening process for the SQ ICD.

## 2012-08-27 ENCOUNTER — Other Ambulatory Visit: Payer: Self-pay | Admitting: Internal Medicine

## 2012-08-28 ENCOUNTER — Encounter: Payer: Self-pay | Admitting: Internal Medicine

## 2012-08-28 ENCOUNTER — Ambulatory Visit (INDEPENDENT_AMBULATORY_CARE_PROVIDER_SITE_OTHER): Payer: No Typology Code available for payment source | Admitting: *Deleted

## 2012-08-28 ENCOUNTER — Encounter: Payer: Self-pay | Admitting: *Deleted

## 2012-08-28 DIAGNOSIS — I428 Other cardiomyopathies: Secondary | ICD-10-CM

## 2012-08-28 NOTE — Progress Notes (Signed)
Pt seen today for S-ICD screening with [employer] rep.  Pt screened appropriate for S-ICD implant.  Procedure scheduled for Thursday, July 3rd at 7:30AM with Dr Graciela Husbands.  Instructions reviewed with patient.  Patient should hold Warfarin for 3 days prior to procedure.

## 2012-08-30 ENCOUNTER — Ambulatory Visit: Payer: No Typology Code available for payment source | Admitting: Nurse Practitioner

## 2012-09-07 DIAGNOSIS — R7309 Other abnormal glucose: Secondary | ICD-10-CM | POA: Insufficient documentation

## 2012-09-07 DIAGNOSIS — T7840XA Allergy, unspecified, initial encounter: Secondary | ICD-10-CM | POA: Insufficient documentation

## 2012-09-07 DIAGNOSIS — M109 Gout, unspecified: Secondary | ICD-10-CM | POA: Insufficient documentation

## 2012-09-07 DIAGNOSIS — F528 Other sexual dysfunction not due to a substance or known physiological condition: Secondary | ICD-10-CM | POA: Insufficient documentation

## 2012-09-13 ENCOUNTER — Ambulatory Visit (INDEPENDENT_AMBULATORY_CARE_PROVIDER_SITE_OTHER): Payer: No Typology Code available for payment source | Admitting: *Deleted

## 2012-09-13 DIAGNOSIS — Z7901 Long term (current) use of anticoagulants: Secondary | ICD-10-CM

## 2012-09-13 DIAGNOSIS — I4891 Unspecified atrial fibrillation: Secondary | ICD-10-CM

## 2012-09-21 ENCOUNTER — Other Ambulatory Visit: Payer: Self-pay | Admitting: Internal Medicine

## 2012-09-24 ENCOUNTER — Other Ambulatory Visit: Payer: Self-pay

## 2012-09-24 MED ORDER — HYDRALAZINE HCL 50 MG PO TABS: 50.0000 mg | ORAL_TABLET | Freq: Three times a day (TID) | ORAL | Status: DC

## 2012-09-26 ENCOUNTER — Other Ambulatory Visit (INDEPENDENT_AMBULATORY_CARE_PROVIDER_SITE_OTHER): Payer: No Typology Code available for payment source

## 2012-09-26 ENCOUNTER — Encounter (HOSPITAL_COMMUNITY): Payer: Self-pay | Admitting: Pharmacy Technician

## 2012-09-26 DIAGNOSIS — I428 Other cardiomyopathies: Secondary | ICD-10-CM

## 2012-09-26 LAB — CBC WITH DIFFERENTIAL/PLATELET
Basophils Absolute: 0 10*3/uL (ref 0.0–0.1)
Eosinophils Relative: 4.1 % (ref 0.0–5.0)
HCT: 35.8 % — ABNORMAL LOW (ref 39.0–52.0)
Lymphocytes Relative: 27 % (ref 12.0–46.0)
Monocytes Relative: 14.6 % — ABNORMAL HIGH (ref 3.0–12.0)
Platelets: 177 10*3/uL (ref 150.0–400.0)
RDW: 17.4 % — ABNORMAL HIGH (ref 11.5–14.6)
WBC: 6 10*3/uL (ref 4.5–10.5)

## 2012-09-26 LAB — BASIC METABOLIC PANEL
CO2: 25 mEq/L (ref 19–32)
Chloride: 107 mEq/L (ref 96–112)
Glucose, Bld: 98 mg/dL (ref 70–99)
Potassium: 3.7 mEq/L (ref 3.5–5.1)
Sodium: 138 mEq/L (ref 135–145)

## 2012-09-26 LAB — PROTIME-INR: INR: 3.7 ratio — ABNORMAL HIGH (ref 0.8–1.0)

## 2012-10-01 ENCOUNTER — Telehealth: Payer: Self-pay | Admitting: *Deleted

## 2012-10-01 NOTE — Telephone Encounter (Signed)
I left a message for the patient to call on his home and cell #'s. He is scheduled for a S-ICD implant on 7/3 with Dr. Graciela Husbands. Dr. Graciela Husbands has done a peer to peer review on this and his S-ICD implant has been denied. We will need to proceed with a standard ICD implant. This can still be done on 7/3 at the same time he has been scheduled for. He will need to stay overnight. Will need to call the lab and change to standard ICD implant if the patient is ok with this and make the lab aware to cancel anesthesia. The patient's labs were also reviewed with Dr. Graciela Husbands and he will need to hold his coumadin. Level will need to be rechecked on 7/3 at the hospital. I have asked that he call back in the am to discuss and that he ask for Deliah Goody. Will forward message to [place].

## 2012-10-02 ENCOUNTER — Other Ambulatory Visit: Payer: Self-pay | Admitting: Internal Medicine

## 2012-10-02 ENCOUNTER — Encounter: Payer: Self-pay | Admitting: Internal Medicine

## 2012-10-02 DIAGNOSIS — I429 Cardiomyopathy, unspecified: Secondary | ICD-10-CM

## 2012-10-02 MED ORDER — SODIUM CHLORIDE 0.9 % IR SOLN
80.0000 mg | Status: DC
  Filled 2012-10-02: qty 2

## 2012-10-02 MED ORDER — DEXTROSE 5 % IV SOLN
3.0000 g | INTRAVENOUS | Status: DC
  Filled 2012-10-02: qty 3000

## 2012-10-03 ENCOUNTER — Encounter (HOSPITAL_COMMUNITY): Payer: Self-pay | Admitting: General Practice

## 2012-10-03 ENCOUNTER — Encounter (HOSPITAL_COMMUNITY)
Admission: RE | Disposition: A | Discharge status: Home / Self Care | Payer: Self-pay | Source: Ambulatory Visit | Attending: Internal Medicine

## 2012-10-03 ENCOUNTER — Ambulatory Visit (HOSPITAL_COMMUNITY): Payer: No Typology Code available for payment source

## 2012-10-03 ENCOUNTER — Ambulatory Visit (HOSPITAL_COMMUNITY)
Admission: RE | Admit: 2012-10-03 | Discharge: 2012-10-04 | Disposition: A | Discharge status: Home / Self Care | Payer: No Typology Code available for payment source | Source: Ambulatory Visit | Attending: Internal Medicine | Admitting: Internal Medicine

## 2012-10-03 DIAGNOSIS — I428 Other cardiomyopathies: Secondary | ICD-10-CM | POA: Insufficient documentation

## 2012-10-03 DIAGNOSIS — I10 Essential (primary) hypertension: Secondary | ICD-10-CM | POA: Insufficient documentation

## 2012-10-03 DIAGNOSIS — I509 Heart failure, unspecified: Secondary | ICD-10-CM | POA: Insufficient documentation

## 2012-10-03 DIAGNOSIS — J449 Chronic obstructive pulmonary disease, unspecified: Secondary | ICD-10-CM | POA: Insufficient documentation

## 2012-10-03 DIAGNOSIS — Z95810 Presence of automatic (implantable) cardiac defibrillator: Secondary | ICD-10-CM

## 2012-10-03 DIAGNOSIS — E669 Obesity, unspecified: Secondary | ICD-10-CM | POA: Insufficient documentation

## 2012-10-03 DIAGNOSIS — I4891 Unspecified atrial fibrillation: Secondary | ICD-10-CM | POA: Insufficient documentation

## 2012-10-03 DIAGNOSIS — F17200 Nicotine dependence, unspecified, uncomplicated: Secondary | ICD-10-CM | POA: Insufficient documentation

## 2012-10-03 DIAGNOSIS — M1990 Unspecified osteoarthritis, unspecified site: Secondary | ICD-10-CM | POA: Insufficient documentation

## 2012-10-03 DIAGNOSIS — J4489 Other specified chronic obstructive pulmonary disease: Secondary | ICD-10-CM | POA: Insufficient documentation

## 2012-10-03 DIAGNOSIS — Z79899 Other long term (current) drug therapy: Secondary | ICD-10-CM | POA: Insufficient documentation

## 2012-10-03 DIAGNOSIS — I429 Cardiomyopathy, unspecified: Secondary | ICD-10-CM

## 2012-10-03 DIAGNOSIS — G4733 Obstructive sleep apnea (adult) (pediatric): Secondary | ICD-10-CM | POA: Insufficient documentation

## 2012-10-03 DIAGNOSIS — I5022 Chronic systolic (congestive) heart failure: Secondary | ICD-10-CM | POA: Insufficient documentation

## 2012-10-03 DIAGNOSIS — E785 Hyperlipidemia, unspecified: Secondary | ICD-10-CM | POA: Insufficient documentation

## 2012-10-03 DIAGNOSIS — Z7901 Long term (current) use of anticoagulants: Secondary | ICD-10-CM | POA: Insufficient documentation

## 2012-10-03 HISTORY — PX: IMPLANTABLE CARDIOVERTER DEFIBRILLATOR IMPLANT: SHX5473

## 2012-10-03 HISTORY — DX: Presence of automatic (implantable) cardiac defibrillator: Z95.810

## 2012-10-03 HISTORY — DX: Unspecified osteoarthritis, unspecified site: M19.90

## 2012-10-03 LAB — SURGICAL PCR SCREEN
MRSA, PCR: NEGATIVE
Staphylococcus aureus: NEGATIVE

## 2012-10-03 SURGERY — IMPLANTABLE CARDIOVERTER DEFIBRILLATOR IMPLANT
Anesthesia: LOCAL | Laterality: Left

## 2012-10-03 MED ORDER — WARFARIN - PHYSICIAN DOSING INPATIENT: Freq: Every day | Status: DC

## 2012-10-03 MED ORDER — MUPIROCIN 2 % EX OINT
TOPICAL_OINTMENT | CUTANEOUS | Status: AC
  Filled 2012-10-03: qty 22

## 2012-10-03 MED ORDER — CARVEDILOL 25 MG PO TABS
25.0000 mg | ORAL_TABLET | Freq: Two times a day (BID) | ORAL | Status: DC
  Administered 2012-10-03 – 2012-10-04 (×2): 25 mg via ORAL
  Filled 2012-10-03 (×3): qty 1

## 2012-10-03 MED ORDER — WARFARIN SODIUM 5 MG PO TABS
5.0000 mg | ORAL_TABLET | ORAL | Status: DC
  Administered 2012-10-03: 5 mg via ORAL
  Filled 2012-10-03 (×2): qty 1

## 2012-10-03 MED ORDER — ATORVASTATIN CALCIUM 20 MG PO TABS
20.0000 mg | ORAL_TABLET | Freq: Every day | ORAL | Status: DC
Start: 2012-10-04 — End: 2012-10-04
  Administered 2012-10-04: 20 mg via ORAL
  Filled 2012-10-03: qty 1

## 2012-10-03 MED ORDER — LIDOCAINE HCL (PF) 1 % IJ SOLN
INTRAMUSCULAR | Status: AC
  Filled 2012-10-03: qty 30

## 2012-10-03 MED ORDER — SODIUM CHLORIDE 0.9 % IV SOLN: INTRAVENOUS | Status: AC

## 2012-10-03 MED ORDER — AMIODARONE HCL 200 MG PO TABS: 400.0000 mg | ORAL_TABLET | Freq: Every day | ORAL | Status: DC

## 2012-10-03 MED ORDER — ACETAMINOPHEN 325 MG PO TABS
325.0000 mg | ORAL_TABLET | ORAL | Status: DC | PRN
  Administered 2012-10-03 (×2): 650 mg via ORAL
  Filled 2012-10-03 (×2): qty 2

## 2012-10-03 MED ORDER — POTASSIUM CHLORIDE CRYS ER 20 MEQ PO TBCR
20.0000 meq | EXTENDED_RELEASE_TABLET | Freq: Two times a day (BID) | ORAL | Status: DC
  Administered 2012-10-03 – 2012-10-04 (×2): 20 meq via ORAL
  Filled 2012-10-03 (×3): qty 1

## 2012-10-03 MED ORDER — HYPROMELLOSE (GONIOSCOPIC) 2.5 % OP SOLN: 1.0000 [drp] | Freq: Every morning | OPHTHALMIC | Status: DC

## 2012-10-03 MED ORDER — FENTANYL CITRATE 0.05 MG/ML IJ SOLN
INTRAMUSCULAR | Status: AC
  Filled 2012-10-03: qty 2

## 2012-10-03 MED ORDER — WARFARIN SODIUM 5 MG PO TABS: 5.0000 mg | ORAL_TABLET | Freq: Every day | ORAL | Status: DC

## 2012-10-03 MED ORDER — SODIUM CHLORIDE 0.9 % IV SOLN
INTRAVENOUS | Status: DC
  Administered 2012-10-03: 1000 mL via INTRAVENOUS

## 2012-10-03 MED ORDER — AMLODIPINE BESYLATE 5 MG PO TABS
5.0000 mg | ORAL_TABLET | Freq: Every day | ORAL | Status: DC
  Administered 2012-10-04: 5 mg via ORAL
  Filled 2012-10-03: qty 1

## 2012-10-03 MED ORDER — WARFARIN SODIUM 7.5 MG PO TABS
7.5000 mg | ORAL_TABLET | ORAL | Status: DC
  Filled 2012-10-03: qty 1

## 2012-10-03 MED ORDER — MIDAZOLAM HCL 5 MG/5ML IJ SOLN
INTRAMUSCULAR | Status: AC
  Filled 2012-10-03: qty 5

## 2012-10-03 MED ORDER — HEPARIN (PORCINE) IN NACL 2-0.9 UNIT/ML-% IJ SOLN
INTRAMUSCULAR | Status: AC
  Filled 2012-10-03: qty 500

## 2012-10-03 MED ORDER — AMIODARONE HCL 200 MG PO TABS
400.0000 mg | ORAL_TABLET | Freq: Every day | ORAL | Status: DC
  Administered 2012-10-04: 400 mg via ORAL
  Filled 2012-10-03: qty 2

## 2012-10-03 MED ORDER — CEFAZOLIN SODIUM 1-5 GM-% IV SOLN
1.0000 g | Freq: Four times a day (QID) | INTRAVENOUS | Status: AC
  Administered 2012-10-03 – 2012-10-04 (×3): 1 g via INTRAVENOUS
  Filled 2012-10-03 (×3): qty 50

## 2012-10-03 MED ORDER — LISINOPRIL 40 MG PO TABS
40.0000 mg | ORAL_TABLET | Freq: Every day | ORAL | Status: DC
  Administered 2012-10-04: 40 mg via ORAL
  Filled 2012-10-03: qty 1

## 2012-10-03 MED ORDER — MUPIROCIN 2 % EX OINT
TOPICAL_OINTMENT | Freq: Two times a day (BID) | CUTANEOUS | Status: DC
  Administered 2012-10-03: 06:00:00 via NASAL
  Filled 2012-10-03: qty 22

## 2012-10-03 MED ORDER — ALLOPURINOL 300 MG PO TABS
300.0000 mg | ORAL_TABLET | Freq: Two times a day (BID) | ORAL | Status: DC
  Administered 2012-10-03 – 2012-10-04 (×2): 300 mg via ORAL
  Filled 2012-10-03 (×3): qty 1

## 2012-10-03 MED ORDER — CHLORHEXIDINE GLUCONATE 4 % EX LIQD
60.0000 mL | Freq: Once | CUTANEOUS | Status: DC
  Filled 2012-10-03: qty 60

## 2012-10-03 MED ORDER — BUPROPION HCL ER (SR) 150 MG PO TB12
150.0000 mg | ORAL_TABLET | Freq: Two times a day (BID) | ORAL | Status: DC
  Administered 2012-10-03 – 2012-10-04 (×2): 150 mg via ORAL
  Filled 2012-10-03 (×3): qty 1

## 2012-10-03 MED ORDER — SPIRONOLACTONE 12.5 MG HALF TABLET
12.5000 mg | ORAL_TABLET | Freq: Every day | ORAL | Status: DC
Start: 2012-10-04 — End: 2012-10-04
  Administered 2012-10-04: 12.5 mg via ORAL
  Filled 2012-10-03: qty 1

## 2012-10-03 MED ORDER — ONDANSETRON HCL 4 MG/2ML IJ SOLN: 4.0000 mg | Freq: Four times a day (QID) | INTRAMUSCULAR | Status: DC | PRN

## 2012-10-03 MED ORDER — SODIUM CHLORIDE 0.9 % IV SOLN: INTRAVENOUS | Status: DC

## 2012-10-03 MED ORDER — POLYVINYL ALCOHOL 1.4 % OP SOLN
1.0000 [drp] | Freq: Every day | OPHTHALMIC | Status: DC
  Administered 2012-10-04: 1 [drp] via OPHTHALMIC
  Filled 2012-10-03: qty 15

## 2012-10-03 MED ORDER — LIDOCAINE HCL (PF) 1 % IJ SOLN
INTRAMUSCULAR | Status: AC
  Filled 2012-10-03: qty 60

## 2012-10-03 MED ORDER — HYDRALAZINE HCL 50 MG PO TABS
50.0000 mg | ORAL_TABLET | Freq: Three times a day (TID) | ORAL | Status: DC
  Administered 2012-10-03 – 2012-10-04 (×3): 50 mg via ORAL
  Filled 2012-10-03 (×5): qty 1

## 2012-10-03 NOTE — H&P (Signed)
ELECTROPHYSIOLOGY ADMISSION HISTORY & PHYSICAL  Patient ID: Jason Russell MRN: [id number], DOB/AGE: Jan 08, 1951   Date of Admission: 10/03/2012  Primary Physician: Nolon Nations, MD Primary Cardiologist / EP: Tenny Craw, MD / Graciela Husbands, MD Reason for Admission: ICD implantation  History of Present Illness [name] is a pleasant 62 year old man with a NICM, EF 35%, chronic systolic HF, minimal nonobstructive CAD by cath April 2014, OSA, AF and asthma who presents today for ICD implant for primary prevention SCD.   He reports he has been doing well. He has no complaints today. He denies CP or SOB. He denies palpitations, dizziness, near syncope or syncope. He denies LE or abdominal swelling. He denies orthopnea, PND or recent weight gain. He denies any changes to his health or medical history since he last saw Dr. Graciela Husbands in May. He reports compliance with his medications.  Past Medical History Past Medical History  Diagnosis Date  . Hypertension   . Dyslipidemia   . Obesity   . Atrial fibrillation     Amiodarone started 10/2011; Coumadin  . OSA (obstructive sleep apnea)   . Tobacco abuse   . COPD (chronic obstructive pulmonary disease)   . Asthma   . NICM (nonischemic cardiomyopathy)     LHC 4/14:  no CAD  . Chronic systolic heart failure     a. Echo 7/13: EF 25%;  b. echo 04/2012:  Mild LVH, EF 30-35%, Gr 1 DD, mild AI, mild MR, mild LAE    Past Surgical History Past Surgical History  Procedure Laterality Date  . Total hip arthroplasty  1999  . Cardioversion  2011    Allergies/Intolerances No Known Allergies  Home Medications Medications Prior to Admission  Medication Sig Dispense Refill  . allopurinol (ZYLOPRIM) 300 MG tablet Take 300 mg by mouth 2 (two) times daily.       [name] amiodarone (PACERONE) 400 MG tablet Take 1 tablet (400 mg total) by mouth daily.  90 tablet  4  . amLODipine (NORVASC) 5 MG tablet Take 1 tablet (5 mg total) by mouth daily.  90 tablet  3  . atorvastatin  (LIPITOR) 20 MG tablet Take 1 tablet (20 mg total) by mouth daily.  90 tablet  3  . buPROPion (WELLBUTRIN SR) 150 MG 12 hr tablet Take 150 mg by mouth 2 (two) times daily.       . carvedilol (COREG) 25 MG tablet Take 1 tablet (25 mg total) by mouth 2 (two) times daily.  180 tablet  3  . hydrALAZINE (APRESOLINE) 50 MG tablet Take 1 tablet (50 mg total) by mouth 3 (three) times daily.  270 tablet  3  . hydroxypropyl methylcellulose (ISOPTO TEARS) 2.5 % ophthalmic solution Place 1 drop into both eyes every morning.      [name] KLOR-CON M20 20 MEQ tablet Take 20 mEq by mouth 2 (two) times daily.       [name] lisinopril (PRINIVIL,ZESTRIL) 40 MG tablet Take 1 tablet (40 mg total) by mouth daily.  90 tablet  3  . niacin (NIASPAN) 1000 MG CR tablet Take 1 tablet (1,000 mg total) by mouth at bedtime.  90 tablet  3  . spironolactone (ALDACTONE) 25 MG tablet Take 12.5 mg by mouth daily.      [name] warfarin (COUMADIN) 5 MG tablet Take 5-7.5 mg by mouth daily. Take 1 tablet daily except take 1.5 tablet (7.5mg ) on friday       Family History Family History  Problem Relation Age of Onset  .  Other Father     deceased- unknow reason  . Hypertension Mother   . Lung cancer Brother     Social History Social History  . Marital Status: Married   Occupational History  . 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 Smoking status: Current Every Day Smoker -- 0.50 packs/day for 40 years    Types: Cigarettes  . Smokeless tobacco: Never Used     Comment: cutting back to 4-5 cigs daily  . Alcohol Use: No  . Drug Use: No   Review of Systems General: No chills, fever, night sweats or weight changes.  Cardiovascular: No chest pain, dyspnea on exertion, edema, orthopnea, palpitations, paroxysmal nocturnal dyspnea. Dermatological: No rash, lesions or masses. Respiratory: No cough, dyspnea. Urologic: No hematuria, dysuria. Abdominal: No nausea, vomiting, diarrhea, bright red blood per rectum, melena, or  hematemesis. Neurologic: No visual changes, weakness, changes in mental status. All other systems reviewed and are otherwise negative except as noted above.  Physical Exam Vitals: Blood pressure 135/75, pulse 72, temperature 98 F (36.7 C), temperature source Oral, resp. rate 18, height 6\' 1"  (1.854 m), weight 275 lb (124.739 kg), SpO2 98.00%.  General: Well developed, well appearing 62 y.o. male in no acute distress. HEENT: Normocephalic, atraumatic. EOMs intact. Sclera nonicteric. Oropharynx clear.  Neck: Supple without bruits. No JVD. Lungs: Respirations regular and unlabored, CTA bilaterally. No wheezes, rales or rhonchi. Heart: RRR. S1, S2 present. No murmurs, rub, S3 or S4. Abdomen: Soft, non-tender, non-distended. BS present x 4 quadrants. No hepatosplenomegaly.  Extremities: No clubbing, cyanosis or edema. DP/PT/Radials 2+ and equal bilaterally. Psych: Normal affect. Neuro: Alert and oriented X 3. Moves all extremities spontaneously. Musculoskeletal: No kyphosis. Skin: Intact. Warm and dry. No rashes or petechiae in exposed areas.   Labs Lab Results  Component Value Date   WBC 6.0 09/26/2012   HGB 11.9* 09/26/2012   HCT 35.8* 09/26/2012   MCV 93.6 09/26/2012   PLT 177.0 09/26/2012    Recent Labs Lab 09/26/12 1123  NA 138  K 3.7  CL 107  CO2 25  BUN 29*  CREATININE 1.5  CALCIUM 8.9  GLUCOSE 98   INR 1.84   Radiology/Studies No results found.  12-lead ECG today - NSR at 69 bpm; QRS duration 106 msec  Assessment and Plan 1. NICM, EF 35% 2. Chronic systolic HF 3. Minimal nonobstructive CAD  [name] has a NICM with persistent LV dysfunction despite optimal medical therapy. He meets criteria for ICD implant for primary prevention of SCD. Risks, benefits and alternatives to ICD were discussed in detail with the patient today. These risks include, but are not limited to, bleeding, infection, pneumothorax, perforation, tamponade, vascular damage, renal failure,  lead dislodgement, MI, stroke and death. Mr. Ohman expressed verbal understanding and wishes to proceed.  Signed, Rick Duff, PA-C 10/03/2012, 7:07 AM  Reviewed change from SICD to transvenous

## 2012-10-03 NOTE — Telephone Encounter (Signed)
Dr Graciela Husbands spoke with pt, he will hold his coumadin and the procedure was changed at the hosp.

## 2012-10-03 NOTE — Progress Notes (Signed)
Patient resting quietly in room 3W01 with no complaints, watching TV. Assessment without change from 6524. Denies any needs at this time.

## 2012-10-03 NOTE — Progress Notes (Signed)
PHARMACIST - PHYSICIAN COMMUNICATION DR:  Graciela Husbands CONCERNING: Pharmacy Care Issues Regarding Warfarin Labs  RECOMMENDATION (Action Taken): A baseline and daily protime for three days has been ordered to meet the [facility] Patient safety goal and comply with the current [facility] & Therapeutics Committee policy.   The Pharmacy will defer all warfarin dose order changes and follow up of lab results to the prescriber unless an additional order to initiate a "pharmacy Coumadin consult" is placed.  DESCRIPTION:  While hospitalized, to be in compliance with The Joint Commission National Patient Safety Goals, all patients on warfarin must have a baseline and/or current protime prior to the administration of warfarin. Pharmacy has received your order for warfarin without these required laboratory assessments.  Armine Rizzolo L. Illene Bolus, PharmD, BCPS Clinical Pharmacist Pager: [phone] Pharmacy: [phone] 10/03/2012 9:58 AM

## 2012-10-03 NOTE — CV Procedure (Signed)
Jason Russell [id number]  [id number]  Preop Dx: NICM Postop Dx same/   Procedure: single chamber ICD implantation with out intraoperative defibrillation threshold testing  Following the obtaining of informed consent the patient was brought to the electrophysiology laboratory in place of the fluoroscopic table in the supine position.  After routine prep and drape, lidocaine was infiltrated in the prepectoral subclavicular region and an incision was made and carried down to the layer of the prepectoral fascia using electrocautery and sharp dissection. A pocket was formed similarly.  Thereafter an attention was turned to gain access to the extrathoracic left subclavian vein which was accomplished  with much  difficulty and with possible    aspiration of air but without puncture of the artery. A 8 French sheath was placed for which was then passed a  dual coil  active fixation defibrillator lead, model St Jude  serial number [id number]. It was  passed under fluoroscopic guidance to the right ventricular apex. In its location the bipolar R wave was 7.6 millivolts, impedance was 608 ohms, the pacing threshold was 0.5 volts at 0.5 msec. Current at threshold was 08 mA.  There was no diaphragmatic pacing at 10 V. The current of injury was brisk .  The lead was secured to the prepectoral fascia and then attached to a St Jude  ICD, serial number  [id number].  Through the device, the bipolar R wave was 6.3 millivolts, impedance was 640 ohms, the pacing threshold was .0.5 volts at 0.5 msec. High-voltage impedance of 55/60 ohms.  The pocket was copiously irrigated with antibiotic containing saline solution. Hemostasis was assured, and the device and the lead was placed in the pocket and secured to the prepectoral fascia.  The wound is closed in 3 layers in normal fashion. The wound is washed dried and a benzoin Steri-Strips dressing was then applied. Needle counts sponge counts and instrument counts were correct at  the end of the procedure according to the staff.    Patient tolerated the procedure without apparent complication      Cx: possible aspiration of air     Sherryl Manges, MD 10/03/2012 8:48 AM

## 2012-10-03 NOTE — Progress Notes (Signed)
Utilization Review Completed Terril Amaro J. Daksha Koone, RN, BSN, NCM [phone]  

## 2012-10-03 NOTE — Progress Notes (Signed)
RT placed patient on cpap auto via full face mask. Patient is tolerating well at this time.

## 2012-10-04 ENCOUNTER — Encounter (HOSPITAL_COMMUNITY): Payer: Self-pay | Admitting: Physician Assistant

## 2012-10-04 ENCOUNTER — Ambulatory Visit (HOSPITAL_COMMUNITY): Payer: No Typology Code available for payment source

## 2012-10-04 DIAGNOSIS — I428 Other cardiomyopathies: Secondary | ICD-10-CM

## 2012-10-04 DIAGNOSIS — I2510 Atherosclerotic heart disease of native coronary artery without angina pectoris: Secondary | ICD-10-CM | POA: Insufficient documentation

## 2012-10-04 LAB — PROTIME-INR
INR: 1.61 — ABNORMAL HIGH (ref 0.00–1.49)
Prothrombin Time: 18.7 seconds — ABNORMAL HIGH (ref 11.6–15.2)

## 2012-10-04 NOTE — Discharge Summary (Signed)
Discharge Summary   Patient ID: Jason Russell MRN: [id number], DOB/AGE: [birth year] 61 y.o. Admit date: 10/03/2012 D/C date:     10/04/2012  Primary Cardiologist: Tenny Craw  Primary Discharge Diagnoses:  1. Chronic systolic CHF/NICM EF 35% - s/p St. Jude ICD implantation 10/03/12  Secondary Discharge Diagnoses:  Past Medical History  Diagnosis Date  . Hypertension   . Dyslipidemia   . Obesity   . Atrial fibrillation     Amiodarone started 10/2011; Coumadin  . OSA (obstructive sleep apnea)   . Tobacco abuse   . COPD (chronic obstructive pulmonary disease)   . Asthma   . NICM (nonischemic cardiomyopathy)     LHC 4/14:  minimal CAD  . Arthritis      Hospital Course: Jason Russell is a 62 y/o M with NICM EF 35%, chronic systolic CHF, afib on Coumadin, minimal nonobstructive CAD by cath April 2014, OSA, AF and asthma who presented to [facility] yesterday for ICD implant for primary prevention of SCD. He has been doing well. He denied any complaints including CP or SOB, palpitations, dizziness, near syncope or syncope. He has had a NICM with persistent LV dysfunction despite optimal medical therapy and was felt to meet criteria for ICD implantation. He underwent implantation of St. Jude ICD successfully without complication. CXR this AM was nonacute. Coumadin was resumed. Dr. Ladona Ridgel has seen and examined him today and feels he is stable for discharge. See appointments already made below - I have also left message on after-hours voicemail given the holiday to schedule f/u 3 months with Dr. Graciela Husbands.   Discharge Vitals: Blood pressure 151/70, pulse 73, temperature 97.8 F (36.6 C), temperature source Axillary, resp. rate 18, height 6\' 1"  (1.854 m), weight 275 lb (124.739 kg), SpO2 100.00%.  Labs: Lab Results  Component Value Date   WBC 6.0 09/26/2012   HGB 11.9* 09/26/2012   HCT 35.8* 09/26/2012   MCV 93.6 09/26/2012   PLT 177.0 09/26/2012     Lab Results  Component Value Date   CHOL  156 12/13/2011   HDL 35.40* 12/13/2011   LDLCALC 96 12/13/2011   TRIG 121.0 12/13/2011   No results found for this basename: DDIMER    Diagnostic Studies/Procedures   ICD implant as above  Dg Chest 2 View  10/04/2012   *RADIOLOGY REPORT*  Clinical Data: Chest soreness.  Recent defibrillator placement.  CHEST - 2 VIEW  Comparison: 10/03/2012  Findings: AICD noted with stable lead positioning.  Tortuous thoracic aorta.  Heart size within normal limits.  No pneumothorax.  The lungs appear clear.  Thoracic spondylosis is present.  IMPRESSION:  1.  No acute findings. 2.  Thoracic spondylosis. 3.  Tortuous aorta.   Original Report Authenticated By: Gaylyn Rong, M.D.   Dg Chest Port 1 View  10/03/2012   *RADIOLOGY REPORT*  Clinical Data: Pacemaker insertion.  PORTABLE CHEST - 1 VIEW  Comparison: 11/01/2011  Findings: Pacemaker/AICD has been placed on the left.  Components appear grossly well positioned.  There is cardiomegaly.  Lungs are clear.  No pneumothorax.  No effusion.  IMPRESSION: Pacemaker/AICD grossly well positioned.  No apparent complication related to placement.   Original Report Authenticated By: Paulina Fusi, M.D.    Discharge Medications     Medication List         allopurinol 300 MG tablet  Commonly known as:  ZYLOPRIM  Take 300 mg by mouth 2 (two) times daily.     amiodarone 400 MG tablet  Commonly known  as:  PACERONE  Take 1 tablet (400 mg total) by mouth daily.     amLODipine 5 MG tablet  Commonly known as:  NORVASC  Take 1 tablet (5 mg total) by mouth daily.     atorvastatin 20 MG tablet  Commonly known as:  LIPITOR  Take 1 tablet (20 mg total) by mouth daily.     buPROPion 150 MG 12 hr tablet  Commonly known as:  WELLBUTRIN SR  Take 150 mg by mouth 2 (two) times daily.     carvedilol 25 MG tablet  Commonly known as:  COREG  Take 1 tablet (25 mg total) by mouth 2 (two) times daily.     hydrALAZINE 50 MG tablet  Commonly known as:  APRESOLINE  Take 1  tablet (50 mg total) by mouth 3 (three) times daily.     hydroxypropyl methylcellulose 2.5 % ophthalmic solution  Commonly known as:  ISOPTO TEARS  Place 1 drop into both eyes every morning.     KLOR-CON M20 20 MEQ tablet  Generic drug:  potassium chloride SA  Take 20 mEq by mouth 2 (two) times daily.     lisinopril 40 MG tablet  Commonly known as:  PRINIVIL,ZESTRIL  Take 1 tablet (40 mg total) by mouth daily.     niacin 1000 MG CR tablet  Commonly known as:  NIASPAN  Take 1 tablet (1,000 mg total) by mouth at bedtime.     spironolactone 25 MG tablet  Commonly known as:  ALDACTONE  Take 12.5 mg by mouth daily.     warfarin 5 MG tablet  Commonly known as:  COUMADIN  Take 5-7.5 mg by mouth daily. Take 1 tablet daily except take 1.5 tablet (7.5mg ) on friday        Disposition   The patient will be discharged in stable condition to home. Discharge Orders   Future Appointments Provider Department Dept Phone   10/11/2012 3:00 PM [facility] [phone]   10/14/2012 2:30 PM [facility] [employer] Main Office [place]) [phone]   10/31/2012 1:45 PM Pricilla Riffle, MD [facility] Main Office [place]) [phone]   03/14/2013 3:00 PM Barbaraann Share, MD [facility] [phone]   Future Orders Complete By Expires     Diet - low sodium heart healthy  As directed     Increase activity slowly  As directed     Comments:      Please see attached sheet at the end of your After-Visit Summary for instructions on wound care, activity, and bathing.      Follow-up Information   Follow up with [facility]. (Coumadin clinic appointment 10/11/12 at 3pm)    Contact information:   [address] [place] [address] [phone]      Follow up with [facility]. (ICD/Wound check 10/14/12 at 2:30pm)    Contact information:   [address] [place]  [address] [phone]      Follow up with Dietrich Pates, MD. (It appears our office has tried to contact you regarding rescheduling your 10/31/12 appointment. Please call office on Monday.)    Contact information:   [address] [place] [address] [phone]       Follow up with Sherryl Manges, MD. (Our office will call to arrange follow-up appointment for 3 months.)    Contact information:   [address] [place] [address] [phone]  Duration of Discharge Encounter: Greater than 30 minutes including physician and PA time.  Signed, Marita Burnsed PA-C 10/04/2012, 11:15 AM

## 2012-10-04 NOTE — Progress Notes (Signed)
D/c orders received;IV removed with gauze on, pt remains in stable condition, pt meds and instructions reviewed and given to pt; reviewed ICD care instructions with pt; pt d/c to home

## 2012-10-04 NOTE — Progress Notes (Signed)
Patient ID: Jason Russell, male   DOB: Aug 04, 1950, 62 y.o.   MRN: [id number] Subjective:  S/p ICD. No chest pain or sob. Minimal incisional soreness.  Objective:  Vital Signs in the last 24 hours: Temp:  [97.8 F (36.6 C)-97.9 F (36.6 C)] 97.8 F (36.6 C) (07/04 0624) Pulse Rate:  [60-73] 64 (07/04 0624) Resp:  [16-24] 18 (07/04 0624) BP: (115-169)/(74-102) 156/92 mmHg (07/04 0624) SpO2:  [99 %-100 %] 100 % (07/04 0624)  Intake/Output from previous day: 07/03 0701 - 07/04 0700 In: 560 [P.O.:360; I.V.:150; IV Piggyback:50] Out: 650 [Urine:650] Intake/Output from this shift: Total I/O In: 240 [P.O.:240] Out: -   Physical Exam: Well appearing NAD HEENT: Unremarkable Neck:  No JVD, no thyromegally Lungs:  Clear with no wheezes HEART:  Regular rate rhythm, no murmurs, no rubs, no clicks Abd:  obese, positive bowel sounds, no organomegally, no rebound, no guarding Ext:  2 plus pulses, no edema, no cyanosis, no clubbing Skin:  No rashes no nodules Neuro:  CN II through XII intact, motor grossly intact  Lab Results: No results found for this basename: WBC, HGB, PLT,  in the last 72 hours No results found for this basename: NA, K, CL, CO2, GLUCOSE, BUN, CREATININE,  in the last 72 hours No results found for this basename: TROPONINI, CK, MB,  in the last 72 hours Hepatic Function Panel No results found for this basename: PROT, ALBUMIN, AST, ALT, ALKPHOS, BILITOT, BILIDIR, IBILI,  in the last 72 hours No results found for this basename: CHOL,  in the last 72 hours No results found for this basename: PROTIME,  in the last 72 hours  Imaging: Dg Chest Port 1 View  10/03/2012   *RADIOLOGY REPORT*  Clinical Data: Pacemaker insertion.  PORTABLE CHEST - 1 VIEW  Comparison: 11/01/2011  Findings: Pacemaker/AICD has been placed on the left.  Components appear grossly well positioned.  There is cardiomegaly.  Lungs are clear.  No pneumothorax.  No effusion.  IMPRESSION: Pacemaker/AICD  grossly well positioned.  No apparent complication related to placement.   Original Report Authenticated By: Paulina Fusi, M.D.    Cardiac Studies: Tele - nsr Assessment/Plan:  1. Chronic systolic heart failure 2. S/p ICD Rec: ok for discharge home. Usual followup.  LOS: 1 day    Jason Russell,M.D. 10/04/2012, 9:36 AM

## 2012-10-11 ENCOUNTER — Ambulatory Visit (INDEPENDENT_AMBULATORY_CARE_PROVIDER_SITE_OTHER): Payer: No Typology Code available for payment source

## 2012-10-11 DIAGNOSIS — I4891 Unspecified atrial fibrillation: Secondary | ICD-10-CM

## 2012-10-11 DIAGNOSIS — Z7901 Long term (current) use of anticoagulants: Secondary | ICD-10-CM

## 2012-10-11 LAB — POCT INR: INR: 2.2

## 2012-10-14 ENCOUNTER — Ambulatory Visit (INDEPENDENT_AMBULATORY_CARE_PROVIDER_SITE_OTHER): Payer: No Typology Code available for payment source | Admitting: *Deleted

## 2012-10-14 ENCOUNTER — Encounter: Payer: Self-pay | Admitting: *Deleted

## 2012-10-14 DIAGNOSIS — I428 Other cardiomyopathies: Secondary | ICD-10-CM

## 2012-10-14 LAB — ICD DEVICE OBSERVATION
BRDY-0002RV: 40 {beats}/min
FVT: 0
HV IMPEDENCE: 35 Ohm
PACEART VT: 0
TOT-0007: 0
TZAT-0001SLOWVT: 1
TZAT-0012SLOWVT: 200 ms
TZAT-0013SLOWVT: 3
TZAT-0020SLOWVT: 1 ms
TZST-0001SLOWVT: 2
TZST-0001SLOWVT: 4
TZST-0001SLOWVT: 5
TZST-0003SLOWVT: 700 V
TZST-0003SLOWVT: 845 V
VENTRICULAR PACING ICD: 0 pct
VF: 0

## 2012-10-14 NOTE — Progress Notes (Signed)
Pt seen in device clinic for follow up of recently implanted ICD.  Wound well healed.  No redness or edema.  Some minor swelling from fluid retention. Steri-strips removed today.   Device interrogated and found to be functioning normally.  Changed VF NID from 20 to 24. See PaceArt for full details.  Pt denies chest pain, shortness of breath, palpitations, or dizziness.  Pt to follow up with Dr. Graciela Husbands in 3 months.   Jason Russell 10/14/2012 3:07 PM

## 2012-10-31 ENCOUNTER — Ambulatory Visit: Payer: No Typology Code available for payment source | Admitting: Internal Medicine

## 2012-11-08 ENCOUNTER — Ambulatory Visit (INDEPENDENT_AMBULATORY_CARE_PROVIDER_SITE_OTHER): Payer: No Typology Code available for payment source

## 2012-11-08 ENCOUNTER — Encounter: Payer: Self-pay | Admitting: Internal Medicine

## 2012-11-08 DIAGNOSIS — Z7901 Long term (current) use of anticoagulants: Secondary | ICD-10-CM

## 2012-11-08 DIAGNOSIS — I4891 Unspecified atrial fibrillation: Secondary | ICD-10-CM

## 2012-11-29 ENCOUNTER — Ambulatory Visit (INDEPENDENT_AMBULATORY_CARE_PROVIDER_SITE_OTHER): Payer: No Typology Code available for payment source

## 2012-11-29 DIAGNOSIS — I4891 Unspecified atrial fibrillation: Secondary | ICD-10-CM

## 2012-11-29 DIAGNOSIS — Z7901 Long term (current) use of anticoagulants: Secondary | ICD-10-CM

## 2012-11-29 LAB — POCT INR: INR: 2.8

## 2012-12-17 ENCOUNTER — Other Ambulatory Visit: Payer: Self-pay | Admitting: Internal Medicine

## 2012-12-20 ENCOUNTER — Ambulatory Visit (INDEPENDENT_AMBULATORY_CARE_PROVIDER_SITE_OTHER): Payer: No Typology Code available for payment source | Admitting: Internal Medicine

## 2012-12-20 VITALS — BP 140/80 | HR 70 | Ht 73.0 in | Wt 318.0 lb

## 2012-12-20 DIAGNOSIS — E782 Mixed hyperlipidemia: Secondary | ICD-10-CM

## 2012-12-20 NOTE — Patient Instructions (Addendum)
Fasting Labs at [facility] on [address]     Your physician wants you to follow-up in: 6 months You will receive a reminder letter in the mail two months in advance. If you don't receive a letter, please call our office to schedule the follow-up appointment.

## 2012-12-20 NOTE — Progress Notes (Signed)
History of Present Illness: Jason Russell is a 62 y.o. male who returns for follow up after recent heart cath. He has a hx of HTN, sleep apnea, parox AFib, systolic CHF and dilated CM.  He was converted to NSR with Amiodarone (not a Tikosyn candidate with prolonged QT).  EF was thought to down due to tachycardia in rapid AFib.  Echo 7/13:  EF 25%.  Follow up echo 04/2012:  Mild LVH, EF 30-35%, Gr 1 DD, mild AI, mild MR, mild LAE.  Without full recovery of LVF, he was referred for cardiac cath.  LHC 07/15/12:  No obstructive CAD.  He has a NICM. Since he was seen in clinic earlier this summer he has done well  Denies palpitaitons.  Breathing is OK  No CP  No edema     Wt Readings from Last 3 Encounters:  12/20/12 318 lb (144.244 kg)  10/03/12 275 lb (124.739 kg)  10/03/12 275 lb (124.739 kg)     Past Medical History  Diagnosis Date  . Hypertension   . Dyslipidemia   . Obesity   . Atrial fibrillation     Amiodarone started 10/2011; Coumadin  . OSA (obstructive sleep apnea)   . Tobacco abuse   . COPD (chronic obstructive pulmonary disease)   . Asthma   . NICM (nonischemic cardiomyopathy)     LHC 4/14:  minimal CAD  . Chronic systolic heart failure     a. Echo 7/13: EF 25%;  b. echo 04/2012:  Mild LVH, EF 30-35%, Gr 1 DD, mild AI, mild MR, mild LAE  . 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 St. Jude ICD implantation 10/03/12.  . Arthritis     Current Outpatient Prescriptions  Medication Sig Dispense Refill  . allopurinol (ZYLOPRIM) 300 MG tablet Take 300 mg by mouth 2 (two) times daily.       [name] amiodarone (PACERONE) 400 MG tablet Take 1 tablet (400 mg total) by mouth daily.  90 tablet  4  . amLODipine (NORVASC) 5 MG tablet Take 1 tablet (5 mg total) by mouth daily.  90 tablet  3  . atorvastatin (LIPITOR) 20 MG tablet Take 1 tablet (20 mg total) by mouth daily.  90 tablet  3  . buPROPion (WELLBUTRIN SR) 150 MG 12 hr tablet Take 150 mg by mouth 2  (two) times daily.       . carvedilol (COREG) 25 MG tablet Take 1 tablet (25 mg total) by mouth 2 (two) times daily.  180 tablet  3  . hydrALAZINE (APRESOLINE) 50 MG tablet Take 1 tablet (50 mg total) by mouth 3 (three) times daily.  270 tablet  3  . KLOR-CON M20 20 MEQ tablet Take 20 mEq by mouth daily.       [name] lisinopril (PRINIVIL,ZESTRIL) 40 MG tablet Take 1 tablet (40 mg total) by mouth daily.  90 tablet  3  . niacin (NIASPAN) 1000 MG CR tablet Take 1 tablet (1,000 mg total) by mouth at bedtime.  90 tablet  3  . spironolactone (ALDACTONE) 25 MG tablet Take 12.5 mg by mouth daily.      [name] VIAGRA 100 MG tablet As directed      . warfarin (COUMADIN) 5 MG tablet TAKE AS DIRECTED BY COUMADIN CLINIC  40 tablet  3   No current facility-administered medications for this visit.    Allergies:   No Known Allergies  Social History:  The patient  reports that he has been  smoking Cigarettes.  He has a 20 pack-year smoking history. He has never used smokeless tobacco. He reports that he does not drink alcohol or use illicit drugs.   ROS:  Please see the history of present illness.    All other systems reviewed and negative.   PHYSICAL EXAM: VS:  BP 140/80  Pulse 70  Ht 6\' 1"  (1.854 m)  Wt 318 lb (144.244 kg)  BMI 41.96 kg/m2 Patient is a morbidly obese 62yo  in no acute distress HEENT: normal Neck: no JVD Cardiac:  normal S1, S2; RRR; no murmur Lungs:  clear to auscultation bilaterally, no wheezing, rhonchi or rales Abd: soft, nontender, no hepatomegaly Ext: no edema  Skin: warm and dry Neuro:  CNs 2-12 intact, no focal abnormalities noted     ASSESSMENT AND PLAN:  1. NICM:  No angiographic CAD on cath.  VOlume status looks good  Keep on current regimen.  F/U with Odessa Fleming with ICD 2. Atrial Fibrillation:  Maintaining NSR.  Continue coumadin.  Review with S PUtt other agents.  Review with Odessa Fleming taper of amiodarone. 3. Hypertension:  Controlled.  Continue current  therapy. 4. Hyperlipidemia:  Continue statin. Set up for fasting lipids 5.  Obesity  Patient knows what he has to do with diet.   5. Disposition:  F/U in march

## 2012-12-23 ENCOUNTER — Other Ambulatory Visit (INDEPENDENT_AMBULATORY_CARE_PROVIDER_SITE_OTHER): Payer: No Typology Code available for payment source

## 2012-12-23 DIAGNOSIS — E782 Mixed hyperlipidemia: Secondary | ICD-10-CM

## 2012-12-23 LAB — LIPID PANEL
Cholesterol: 150 mg/dL (ref 0–200)
HDL: 46.6 mg/dL (ref >39.00)
LDL Cholesterol: 89 mg/dL (ref 0–99)
Triglycerides: 73 mg/dL (ref 0.0–149.0)
VLDL: 14.6 mg/dL (ref 0.0–40.0)

## 2012-12-25 ENCOUNTER — Encounter: Payer: Self-pay | Admitting: *Deleted

## 2012-12-27 ENCOUNTER — Ambulatory Visit (INDEPENDENT_AMBULATORY_CARE_PROVIDER_SITE_OTHER): Payer: No Typology Code available for payment source | Admitting: *Deleted

## 2012-12-27 DIAGNOSIS — I4891 Unspecified atrial fibrillation: Secondary | ICD-10-CM

## 2012-12-27 DIAGNOSIS — Z7901 Long term (current) use of anticoagulants: Secondary | ICD-10-CM

## 2013-01-06 ENCOUNTER — Telehealth: Payer: Self-pay | Admitting: *Deleted

## 2013-01-06 NOTE — Telephone Encounter (Signed)
Talked with pt regarding changing to Xarelto and pt instructed to call insurance company to see what copay is for this medication and he states he will do so and will talk more about this on his visit to clinic 01/30/2013. Also instructed will talk with DR Tenny Craw nurse regarding change in dose of amiodarone as he states he has not been instructed regarding this

## 2013-01-15 ENCOUNTER — Telehealth: Payer: Self-pay | Admitting: *Deleted

## 2013-01-15 ENCOUNTER — Other Ambulatory Visit: Payer: Self-pay | Admitting: Internal Medicine

## 2013-01-15 NOTE — Telephone Encounter (Signed)
Patient can go down to amiodarone 200 mg Can switch to xarelto 20 if he wants to instead of coumadin Would need to check INR prior to switch. Left message for pt to call

## 2013-01-29 NOTE — Telephone Encounter (Signed)
Left message for pt to call.

## 2013-01-31 ENCOUNTER — Ambulatory Visit (INDEPENDENT_AMBULATORY_CARE_PROVIDER_SITE_OTHER): Payer: No Typology Code available for payment source | Admitting: Pharmacist

## 2013-01-31 DIAGNOSIS — I4891 Unspecified atrial fibrillation: Secondary | ICD-10-CM

## 2013-01-31 DIAGNOSIS — Z7901 Long term (current) use of anticoagulants: Secondary | ICD-10-CM

## 2013-01-31 MED ORDER — RIVAROXABAN 20 MG PO TABS: 20.0000 mg | ORAL_TABLET | Freq: Every day | ORAL | Status: DC

## 2013-01-31 NOTE — Patient Instructions (Addendum)
1.  Stop warfarin today. 2.  Start Xarelto 20 mg Sunday, and take once daily. 3.  Avoid NSAIDs. 4.  A full discussion of the nature of anticoagulants has been carried out.  A benefit/risk analysis has been presented to the patient, so that they understand the justification for choosing anticoagulation with Xarelto at this time.  The need for compliance is stressed.  Pt is aware to take the medication once daily with the largest meal of the day.  Side effects of potential bleeding are discussed, including unusual colored urine or stools, coughing up blood or coffee ground emesis, nose bleeds or serious fall or head trauma.  Discussed signs and symptoms of stroke. The patient should avoid any OTC items containing aspirin or ibuprofen.  Avoid alcohol consumption.   Call if any signs of abnormal bleeding.  Discussed financial obligations and resolved any difficulty in obtaining medication.  Next lab test test in 4 weeks for BMET and CBC.  Lab work 03/03/13

## 2013-01-31 NOTE — Progress Notes (Signed)
Pt will start on Xarelto 20 mg qd for Afib on 02/02/13.  Stopping warfarin today.  INR was 3.1 today.    Reviewed patients medication list.  Pt is not currently on any combined P-gp and strong CYP3A4 inhibitors/inducers (ketoconazole, traconazole, ritonavir, carbamazepine, phenytoin, rifampin, St. John's wort).  Reviewed labs.  SCr 1.5, Weight 130 kg, CrCl- 95.  Dose appropriate based on CrCl.   Hgb and HCT Within Normal Limits  A full discussion of the nature of anticoagulants has been carried out.  A benefit/risk analysis has been presented to the patient, so that they understand the justification for choosing anticoagulation with Xarelto at this time.  The need for compliance is stressed.  Pt is aware to take the medication once daily with the largest meal of the day.  Side effects of potential bleeding are discussed, including unusual colored urine or stools, coughing up blood or coffee ground emesis, nose bleeds or serious fall or head trauma.  Discussed signs and symptoms of stroke. The patient should avoid any OTC items containing aspirin or ibuprofen.  Avoid alcohol consumption.   Call if any signs of abnormal bleeding.  Discussed financial obligations and resolved any difficulty in obtaining medication.  Next lab test test in 4 weeks for BMET and CBC.

## 2013-02-05 MED ORDER — AMIODARONE HCL 200 MG PO TABS: 200.0000 mg | ORAL_TABLET | Freq: Every day | ORAL | Status: DC

## 2013-02-05 NOTE — Telephone Encounter (Signed)
Spoke with pt, aware to cut the amiodarone to 200 mg once daily. He reports he has the 400 mg tablet and has been taking 1/2 daily. He will change to the 200 mg tablet with next refill.

## 2013-02-06 ENCOUNTER — Other Ambulatory Visit: Payer: Self-pay

## 2013-02-10 ENCOUNTER — Other Ambulatory Visit: Payer: Self-pay | Admitting: Internal Medicine

## 2013-03-03 ENCOUNTER — Other Ambulatory Visit (INDEPENDENT_AMBULATORY_CARE_PROVIDER_SITE_OTHER): Payer: No Typology Code available for payment source

## 2013-03-03 ENCOUNTER — Other Ambulatory Visit: Payer: Self-pay | Admitting: Internal Medicine

## 2013-03-03 DIAGNOSIS — Z7901 Long term (current) use of anticoagulants: Secondary | ICD-10-CM

## 2013-03-03 DIAGNOSIS — I4891 Unspecified atrial fibrillation: Secondary | ICD-10-CM

## 2013-03-03 LAB — BASIC METABOLIC PANEL
BUN: 19 mg/dL (ref 6–23)
CO2: 26 mEq/L (ref 19–32)
Chloride: 104 mEq/L (ref 96–112)
Creatinine, Ser: 1.6 mg/dL — ABNORMAL HIGH (ref 0.4–1.5)
Glucose, Bld: 95 mg/dL (ref 70–99)
Potassium: 3.5 mEq/L (ref 3.5–5.1)

## 2013-03-03 LAB — CBC
HCT: 32.9 % — ABNORMAL LOW (ref 39.0–52.0)
Hemoglobin: 11 g/dL — ABNORMAL LOW (ref 13.0–17.0)
MCHC: 33.5 g/dL (ref 30.0–36.0)
MCV: 92.8 fl (ref 78.0–100.0)
RDW: 17.6 % — ABNORMAL HIGH (ref 11.5–14.6)
WBC: 6.4 10*3/uL (ref 4.5–10.5)

## 2013-03-14 ENCOUNTER — Ambulatory Visit (INDEPENDENT_AMBULATORY_CARE_PROVIDER_SITE_OTHER): Payer: No Typology Code available for payment source | Admitting: Pulmonary Disease

## 2013-03-14 ENCOUNTER — Encounter: Payer: Self-pay | Admitting: Pulmonary Disease

## 2013-03-14 VITALS — BP 138/82 | HR 69 | Temp 97.9°F | Ht 73.0 in | Wt 321.6 lb

## 2013-03-14 DIAGNOSIS — G4733 Obstructive sleep apnea (adult) (pediatric): Secondary | ICD-10-CM

## 2013-03-14 NOTE — Progress Notes (Signed)
   Subjective:    Patient ID: Jason Russell, male    DOB: 1950-10-02, 62 y.o.   MRN: [id number]  HPI The patient comes in today for followup of his obstructive sleep apnea. He is wearing CPAP compliantly, but his home care company never picked up his automatic device. He has gained 70 pounds since the last visit, but I explained to him the auto machine should take care of this. He feels that he sleeps very well, and is satisfied with his daytime alertness.   Review of Systems  Constitutional: Negative for fever and unexpected weight change.  HENT: Negative for congestion, dental problem, ear pain, nosebleeds, postnasal drip, rhinorrhea, sinus pressure, sneezing, sore throat and trouble swallowing.   Eyes: Negative for redness and itching.  Respiratory: Negative for cough, chest tightness, shortness of breath and wheezing.   Cardiovascular: Negative for palpitations and leg swelling.  Gastrointestinal: Negative for nausea and vomiting.  Genitourinary: Negative for dysuria.  Musculoskeletal: Negative for joint swelling.  Skin: Negative for rash.  Neurological: Negative for headaches.  Hematological: Does not bruise/bleed easily.  Psychiatric/Behavioral: Negative for dysphoric mood. The patient is not nervous/anxious.        Objective:   Physical Exam Obese male in no acute distress Nose without purulence or discharge noted No skin breakdown or pressure necrosis from the CPAP mask Neck without lymphadenopathy or thyromegaly Lower extremities with edema noted, no cyanosis Alert and oriented, moves all 4 extremities.       Assessment & Plan:

## 2013-03-14 NOTE — Assessment & Plan Note (Signed)
The patient has been doing well on CPAP, and his home care company never picked up the automatic titrating device. He has since inherited a new CPAP machine from his brother, and wishes to use that device on the automatic setting.  Will arrange this through his home care company. I think the automatic setting will be best since he has a history of wide weight swings. I have encouraged him to work aggressively on weight loss.

## 2013-03-14 NOTE — Patient Instructions (Signed)
You need to return the auto machine to the homecare company Will get them to show you how to use the S9 machine that you just got, and make sure is in working condition.  Will set this to auto, and get a regular plug for the machine Work on weight loss followup with me in 12mos if doing well.

## 2013-03-18 ENCOUNTER — Other Ambulatory Visit: Payer: Self-pay | Admitting: Internal Medicine

## 2013-03-18 MED ORDER — AMIODARONE HCL 200 MG PO TABS: 200.0000 mg | ORAL_TABLET | Freq: Every day | ORAL | Status: DC

## 2013-03-24 ENCOUNTER — Other Ambulatory Visit: Payer: Self-pay | Admitting: Internal Medicine

## 2013-04-06 ENCOUNTER — Other Ambulatory Visit: Payer: Self-pay | Admitting: Pulmonary Disease

## 2013-04-06 DIAGNOSIS — G4733 Obstructive sleep apnea (adult) (pediatric): Secondary | ICD-10-CM

## 2013-05-01 ENCOUNTER — Other Ambulatory Visit: Payer: Self-pay | Admitting: Internal Medicine

## 2013-06-02 ENCOUNTER — Other Ambulatory Visit: Payer: Self-pay | Admitting: Internal Medicine

## 2013-06-27 ENCOUNTER — Encounter: Payer: Self-pay | Admitting: Internal Medicine

## 2013-06-27 ENCOUNTER — Ambulatory Visit (INDEPENDENT_AMBULATORY_CARE_PROVIDER_SITE_OTHER): Payer: No Typology Code available for payment source | Admitting: Internal Medicine

## 2013-06-27 ENCOUNTER — Other Ambulatory Visit: Payer: Self-pay | Admitting: Internal Medicine

## 2013-06-27 VITALS — BP 128/67 | HR 69 | Ht 73.0 in | Wt 309.0 lb

## 2013-06-27 DIAGNOSIS — I428 Other cardiomyopathies: Secondary | ICD-10-CM

## 2013-06-27 MED ORDER — BUPROPION HCL ER (SR) 150 MG PO TB12: 150.0000 mg | ORAL_TABLET | Freq: Two times a day (BID) | ORAL | Status: DC

## 2013-06-27 NOTE — Progress Notes (Signed)
History of Present Illness: Jason Russell is a 63 y.o. male who returns for follow up after recent heart cath. He has a hx of HTN, sleep apnea, parox AFib, systolic CHF and dilated CM.  He was converted to NSR with Amiodarone (not a Tikosyn candidate with prolonged QT).  EF was thought to down due to tachycardia in rapid AFib.  Echo 7/13:  EF 25%.  Follow up echo 04/2012:  Mild LVH, EF 30-35%, Gr 1 DD, mild AI, mild MR, mild LAE.  Without full recovery of LVF, he was referred for cardiac cath.  LHC 07/15/12:  No obstructive CAD.  He has a NICM. He was last seen in the fall.   He notes recently some increased SOB with walking  Has to slow down. Denies orthopnea, no pnd.  No palpitations  No CP  No dizziness    Patient reports he is busy caring for wife who is developing dementia  Wt Readings from Last 3 Encounters:  06/27/13 309 lb (140.161 kg)  03/14/13 321 lb 9.6 oz (145.877 kg)  12/20/12 318 lb (144.244 kg)     Past Medical History  Diagnosis Date  . Hypertension   . Dyslipidemia   . Obesity   . Atrial fibrillation     Amiodarone started 10/2011; Coumadin  . OSA (obstructive sleep apnea)   . Tobacco abuse   . COPD (chronic obstructive pulmonary disease)   . Asthma   . NICM (nonischemic cardiomyopathy)     LHC 4/14:  minimal CAD  . Chronic systolic heart failure     a. Echo 7/13: EF 25%;  b. echo 04/2012:  Mild LVH, EF 30-35%, Gr 1 DD, mild AI, mild MR, mild LAE  . 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 St. Jude ICD implantation 10/03/12.  . Arthritis     Current Outpatient Prescriptions  Medication Sig Dispense Refill  . allopurinol (ZYLOPRIM) 300 MG tablet Take 300 mg by mouth 2 (two) times daily.       [name] amiodarone (PACERONE) 200 MG tablet Take 1 tablet (200 mg total) by mouth daily.  90 tablet  1  . amLODipine (NORVASC) 5 MG tablet TAKE 1 TABLET BY MOUTH EVERY DAY  90 tablet  1  . atorvastatin (LIPITOR) 20 MG tablet TAKE 1 TABLET BY  MOUTH EVERY DAY  90 tablet  1  . buPROPion (WELLBUTRIN SR) 150 MG 12 hr tablet Take 150 mg by mouth 2 (two) times daily.       . carvedilol (COREG) 25 MG tablet Take 1 tablet (25 mg total) by mouth 2 (two) times daily.  180 tablet  3  . hydrALAZINE (APRESOLINE) 50 MG tablet Take 1 tablet (50 mg total) by mouth 3 (three) times daily.  270 tablet  3  . KLOR-CON M20 20 MEQ tablet TAKE 1 TABLET BY MOUTH EVERY DAY  30 tablet  0  . lisinopril (PRINIVIL,ZESTRIL) 40 MG tablet TAKE 1 TABLET BY MOUTH EVERY DAY  90 tablet  0  . niacin (NIASPAN) 1000 MG CR tablet TAKE 1 TABLET (1,000 MG TOTAL) BY MOUTH AT BEDTIME.  90 tablet  0  . Rivaroxaban (XARELTO) 20 MG TABS tablet Take 1 tablet (20 mg total) by mouth daily.  90 tablet  3  . spironolactone (ALDACTONE) 25 MG tablet TAKE (1/2) ONE HALF TABLET BY MOUTH EVERY DAY  45 tablet  1  . VIAGRA 100 MG tablet As directed       No current  facility-administered medications for this visit.    Allergies:   No Known Allergies  Social History:  The patient  reports that he has been smoking Cigarettes.  He has a 20 pack-year smoking history. He has never used smokeless tobacco. He reports that he does not drink alcohol or use illicit drugs.   ROS:  Please see the history of present illness.    All other systems reviewed and negative.   PHYSICAL EXAM: VS:  BP 128/67  Pulse 69  Ht 6\' 1"  (1.854 m)  Wt 309 lb (140.161 kg)  BMI 40.78 kg/m2 Patient is a morbidly obese 62yo  in no acute distress HEENT: normal Neck: no JVD Cardiac:  normal S1, S2; RRR; no murmur Lungs:  clear to auscultation bilaterally, very minimal wheeze   rhonchi or rales Abd: soft, nontender, no hepatomegaly Ext: no edema  Skin: warm and dry Neuro:  CNs 2-12 intact, no focal abnormalities noted   EKG  SR 67    ASSESSMENT AND PLAN:  1. NICM:  No angiographic CAD on cath.  VOlume status looks good but will check labs  BNP  May have a little increase   I would not sched further testing until  review  Keep on current regimen for now  .  F/U with [name] with ICD 2. Atrial Fibrillation:  Maintaining NSR.  Continue coumadin.   Review with [name] taper of amiodarone. 3. Hypertension:  Very well controlled.  Continue current therapy. 4. Hyperlipidemia:  Continue statin.  5.  Obesity  Patient knows what he has to do with diet.  Counselled

## 2013-06-27 NOTE — Patient Instructions (Signed)
Your physician recommends that you return for lab work  TODAY

## 2013-06-28 LAB — HEPATIC FUNCTION PANEL
ALK PHOS: 102 U/L (ref 39–117)
ALT: 17 U/L (ref 0–53)
AST: 22 U/L (ref 0–37)
Albumin: 3.6 g/dL (ref 3.5–5.2)
Bilirubin, Direct: <0.1 mg/dL (ref 0.0–0.3)
Total Bilirubin: 0.3 mg/dL (ref 0.2–1.2)
Total Protein: 6.5 g/dL (ref 6.0–8.3)

## 2013-07-01 ENCOUNTER — Other Ambulatory Visit: Payer: Self-pay | Admitting: Internal Medicine

## 2013-07-16 ENCOUNTER — Telehealth: Payer: Self-pay | Admitting: Internal Medicine

## 2013-07-16 ENCOUNTER — Encounter: Payer: Self-pay | Admitting: Internal Medicine

## 2013-07-16 NOTE — Telephone Encounter (Signed)
07-16-13 SENT PAST DUE LETTER FOR PT TO MAKE APPT FOR PHYS DEFIB CK WITH KLEIN, WAS DUE IN October 2014/MT

## 2013-08-02 ENCOUNTER — Other Ambulatory Visit: Payer: Self-pay | Admitting: Internal Medicine

## 2013-08-04 ENCOUNTER — Other Ambulatory Visit: Payer: Self-pay | Admitting: Internal Medicine

## 2013-08-09 ENCOUNTER — Other Ambulatory Visit: Payer: Self-pay | Admitting: Internal Medicine

## 2013-08-18 ENCOUNTER — Other Ambulatory Visit: Payer: Self-pay | Admitting: *Deleted

## 2013-08-18 MED ORDER — BUPROPION HCL ER (SR) 150 MG PO TB12
150.0000 mg | ORAL_TABLET | Freq: Two times a day (BID) | ORAL | Status: DC
Start: 2013-08-18 — End: 2013-10-29

## 2013-08-30 ENCOUNTER — Other Ambulatory Visit: Payer: Self-pay | Admitting: Internal Medicine

## 2013-09-15 ENCOUNTER — Other Ambulatory Visit: Payer: Self-pay | Admitting: Internal Medicine

## 2013-09-16 ENCOUNTER — Other Ambulatory Visit: Payer: Self-pay | Admitting: Internal Medicine

## 2013-09-30 ENCOUNTER — Other Ambulatory Visit: Payer: Self-pay | Admitting: Internal Medicine

## 2013-10-15 ENCOUNTER — Ambulatory Visit
Admission: RE | Admit: 2013-10-15 | Discharge: 2013-10-15 | Disposition: A | Discharge status: Home / Self Care | Payer: No Typology Code available for payment source | Source: Ambulatory Visit | Attending: Nurse Practitioner | Admitting: Nurse Practitioner

## 2013-10-15 ENCOUNTER — Ambulatory Visit (INDEPENDENT_AMBULATORY_CARE_PROVIDER_SITE_OTHER): Payer: No Typology Code available for payment source | Admitting: *Deleted

## 2013-10-15 ENCOUNTER — Encounter: Payer: Self-pay | Admitting: *Deleted

## 2013-10-15 ENCOUNTER — Encounter (HOSPITAL_COMMUNITY): Payer: Self-pay | Admitting: Emergency Medicine

## 2013-10-15 ENCOUNTER — Inpatient Hospital Stay (HOSPITAL_COMMUNITY)
Admission: EM | Admit: 2013-10-15 | Discharge: 2013-10-18 | DRG: 812 | Disposition: A | Discharge status: Home / Self Care | Payer: No Typology Code available for payment source | Attending: Family Medicine | Admitting: Family Medicine

## 2013-10-15 ENCOUNTER — Encounter: Payer: Self-pay | Admitting: Nurse Practitioner

## 2013-10-15 ENCOUNTER — Ambulatory Visit (INDEPENDENT_AMBULATORY_CARE_PROVIDER_SITE_OTHER): Payer: No Typology Code available for payment source | Admitting: Nurse Practitioner

## 2013-10-15 VITALS — BP 130/60 | HR 84 | Ht 73.0 in | Wt 290.8 lb

## 2013-10-15 DIAGNOSIS — I10 Essential (primary) hypertension: Secondary | ICD-10-CM | POA: Diagnosis present

## 2013-10-15 DIAGNOSIS — R0989 Other specified symptoms and signs involving the circulatory and respiratory systems: Secondary | ICD-10-CM

## 2013-10-15 DIAGNOSIS — D509 Iron deficiency anemia, unspecified: Secondary | ICD-10-CM | POA: Diagnosis present

## 2013-10-15 DIAGNOSIS — I4891 Unspecified atrial fibrillation: Secondary | ICD-10-CM | POA: Diagnosis present

## 2013-10-15 DIAGNOSIS — I428 Other cardiomyopathies: Secondary | ICD-10-CM | POA: Diagnosis present

## 2013-10-15 DIAGNOSIS — E669 Obesity, unspecified: Secondary | ICD-10-CM | POA: Diagnosis present

## 2013-10-15 DIAGNOSIS — M129 Arthropathy, unspecified: Secondary | ICD-10-CM | POA: Diagnosis present

## 2013-10-15 DIAGNOSIS — Z96649 Presence of unspecified artificial hip joint: Secondary | ICD-10-CM

## 2013-10-15 DIAGNOSIS — Z9289 Personal history of other medical treatment: Secondary | ICD-10-CM

## 2013-10-15 DIAGNOSIS — I429 Cardiomyopathy, unspecified: Secondary | ICD-10-CM | POA: Diagnosis present

## 2013-10-15 DIAGNOSIS — I5022 Chronic systolic (congestive) heart failure: Secondary | ICD-10-CM

## 2013-10-15 DIAGNOSIS — J449 Chronic obstructive pulmonary disease, unspecified: Secondary | ICD-10-CM | POA: Diagnosis present

## 2013-10-15 DIAGNOSIS — E876 Hypokalemia: Secondary | ICD-10-CM

## 2013-10-15 DIAGNOSIS — IMO0001 Reserved for inherently not codable concepts without codable children: Secondary | ICD-10-CM

## 2013-10-15 DIAGNOSIS — F17200 Nicotine dependence, unspecified, uncomplicated: Secondary | ICD-10-CM | POA: Diagnosis present

## 2013-10-15 DIAGNOSIS — K449 Diaphragmatic hernia without obstruction or gangrene: Secondary | ICD-10-CM | POA: Diagnosis present

## 2013-10-15 DIAGNOSIS — D649 Anemia, unspecified: Secondary | ICD-10-CM | POA: Diagnosis present

## 2013-10-15 DIAGNOSIS — K922 Gastrointestinal hemorrhage, unspecified: Secondary | ICD-10-CM | POA: Diagnosis present

## 2013-10-15 DIAGNOSIS — J4489 Other specified chronic obstructive pulmonary disease: Secondary | ICD-10-CM | POA: Diagnosis present

## 2013-10-15 DIAGNOSIS — R0609 Other forms of dyspnea: Secondary | ICD-10-CM

## 2013-10-15 DIAGNOSIS — E785 Hyperlipidemia, unspecified: Secondary | ICD-10-CM | POA: Diagnosis present

## 2013-10-15 DIAGNOSIS — Z7901 Long term (current) use of anticoagulants: Secondary | ICD-10-CM

## 2013-10-15 DIAGNOSIS — Z95810 Presence of automatic (implantable) cardiac defibrillator: Secondary | ICD-10-CM

## 2013-10-15 DIAGNOSIS — K648 Other hemorrhoids: Secondary | ICD-10-CM | POA: Diagnosis present

## 2013-10-15 DIAGNOSIS — G4733 Obstructive sleep apnea (adult) (pediatric): Secondary | ICD-10-CM | POA: Diagnosis present

## 2013-10-15 DIAGNOSIS — R0600 Dyspnea, unspecified: Secondary | ICD-10-CM

## 2013-10-15 DIAGNOSIS — E875 Hyperkalemia: Secondary | ICD-10-CM

## 2013-10-15 DIAGNOSIS — E663 Overweight: Secondary | ICD-10-CM | POA: Diagnosis present

## 2013-10-15 HISTORY — DX: Obstructive sleep apnea (adult) (pediatric): G47.33

## 2013-10-15 HISTORY — DX: Personal history of other medical treatment: Z92.89

## 2013-10-15 HISTORY — DX: Gout, unspecified: M10.9

## 2013-10-15 HISTORY — DX: Long term (current) use of anticoagulants: Z79.01

## 2013-10-15 HISTORY — DX: Dependence on other enabling machines and devices: Z99.89

## 2013-10-15 HISTORY — DX: Anemia, unspecified: D64.9

## 2013-10-15 LAB — BASIC METABOLIC PANEL
Anion gap: 17 — ABNORMAL HIGH (ref 5–15)
BUN: 21 mg/dL (ref 6–23)
BUN: 23 mg/dL (ref 6–23)
CALCIUM: 9.3 mg/dL (ref 8.4–10.5)
CO2: 24 mEq/L (ref 19–32)
CO2: 21 mEq/L (ref 19–32)
Calcium: 9.2 mg/dL (ref 8.4–10.5)
Chloride: 107 mEq/L (ref 96–112)
Chloride: 103 mEq/L (ref 96–112)
Creatinine, Ser: 1.6 mg/dL — ABNORMAL HIGH (ref 0.4–1.5)
Creatinine, Ser: 1.86 mg/dL — ABNORMAL HIGH (ref 0.50–1.35)
GFR calc Af Amer: 43 mL/min — ABNORMAL LOW (ref >90)
GFR, EST NON AFRICAN AMERICAN: 37 mL/min — AB (ref >90)
GFR: 56.61 mL/min — ABNORMAL LOW (ref >60.00)
Glucose, Bld: 101 mg/dL — ABNORMAL HIGH (ref 70–99)
Glucose, Bld: 108 mg/dL — ABNORMAL HIGH (ref 70–99)
POTASSIUM: 4.2 meq/L (ref 3.7–5.3)
Potassium: 3.7 mEq/L (ref 3.5–5.1)
Sodium: 139 mEq/L (ref 135–145)
Sodium: 141 mEq/L (ref 137–147)

## 2013-10-15 LAB — HEPATIC FUNCTION PANEL
ALK PHOS: 119 U/L — AB (ref 39–117)
ALT: 18 U/L (ref 0–53)
AST: 22 U/L (ref 0–37)
Albumin: 3.7 g/dL (ref 3.5–5.2)
Total Bilirubin: 0.3 mg/dL (ref 0.3–1.2)
Total Protein: 7.2 g/dL (ref 6.0–8.3)

## 2013-10-15 LAB — CBC
HCT: 18.3 % — CL (ref 39.0–52.0)
HEMATOCRIT: 19.5 % — AB (ref 39.0–52.0)
Hemoglobin: 5.2 g/dL — CL (ref 13.0–17.0)
Hemoglobin: 5.1 g/dL — CL (ref 13.0–17.0)
MCH: 16.8 pg — ABNORMAL LOW (ref 26.0–34.0)
MCHC: 28.3 g/dL — ABNORMAL LOW (ref 30.0–36.0)
MCHC: 26.2 g/dL — ABNORMAL LOW (ref 30.0–36.0)
MCV: 60.4 fl — ABNORMAL LOW (ref 78.0–100.0)
MCV: 64.4 fL — ABNORMAL LOW (ref 78.0–100.0)
Platelets: 303 10*3/uL (ref 150.0–400.0)
Platelets: 270 10*3/uL (ref 150–400)
RBC: 3.04 Mil/uL — ABNORMAL LOW (ref 4.22–5.81)
RBC: 3.03 MIL/uL — ABNORMAL LOW (ref 4.22–5.81)
RDW: 23 % — ABNORMAL HIGH (ref 11.5–15.5)
RDW: 21.9 % — AB (ref 11.5–15.5)
WBC: 9 10*3/uL (ref 4.0–10.5)
WBC: 7.2 10*3/uL (ref 4.0–10.5)

## 2013-10-15 LAB — FIBRINOGEN: Fibrinogen: 477 mg/dL — ABNORMAL HIGH (ref 204–475)

## 2013-10-15 LAB — PROTIME-INR
INR: 2.82 — AB (ref 0.00–1.49)
PROTHROMBIN TIME: 29.7 s — AB (ref 11.6–15.2)

## 2013-10-15 LAB — PREPARE RBC (CROSSMATCH)

## 2013-10-15 LAB — ABO/RH: ABO/RH(D): B POS

## 2013-10-15 LAB — POC OCCULT BLOOD, ED: Fecal Occult Bld: NEGATIVE

## 2013-10-15 LAB — APTT: aPTT: 41 seconds — ABNORMAL HIGH (ref 24–37)

## 2013-10-15 LAB — RETICULOCYTES
RBC.: 2.87 MIL/uL — AB (ref 4.22–5.81)
Retic Count, Absolute: 91.8 10*3/uL (ref 19.0–186.0)
Retic Ct Pct: 3.2 % — ABNORMAL HIGH (ref 0.4–3.1)

## 2013-10-15 LAB — LACTATE DEHYDROGENASE: LDH: 216 U/L (ref 94–250)

## 2013-10-15 LAB — BRAIN NATRIURETIC PEPTIDE: Pro B Natriuretic peptide (BNP): 29 pg/mL (ref 0.0–100.0)

## 2013-10-15 LAB — SEDIMENTATION RATE: Sed Rate: 75 mm/hr — ABNORMAL HIGH (ref 0–22)

## 2013-10-15 MED ORDER — AMIODARONE HCL 200 MG PO TABS
200.0000 mg | ORAL_TABLET | Freq: Every day | ORAL | Status: DC
  Administered 2013-10-16 – 2013-10-18 (×4): 200 mg via ORAL
  Filled 2013-10-15 (×4): qty 1

## 2013-10-15 MED ORDER — SPIRONOLACTONE 12.5 MG HALF TABLET
12.5000 mg | ORAL_TABLET | Freq: Every day | ORAL | Status: DC
  Administered 2013-10-16 – 2013-10-18 (×3): 12.5 mg via ORAL
  Filled 2013-10-15 (×3): qty 1

## 2013-10-15 MED ORDER — ALLOPURINOL 300 MG PO TABS
300.0000 mg | ORAL_TABLET | Freq: Two times a day (BID) | ORAL | Status: DC
  Administered 2013-10-16 – 2013-10-18 (×5): 300 mg via ORAL
  Filled 2013-10-15 (×6): qty 1

## 2013-10-15 MED ORDER — HYDRALAZINE HCL 50 MG PO TABS
50.0000 mg | ORAL_TABLET | Freq: Three times a day (TID) | ORAL | Status: DC
  Administered 2013-10-16 – 2013-10-18 (×7): 50 mg via ORAL
  Filled 2013-10-15 (×11): qty 1

## 2013-10-15 MED ORDER — ONDANSETRON HCL 4 MG/2ML IJ SOLN: 4.0000 mg | Freq: Four times a day (QID) | INTRAMUSCULAR | Status: DC | PRN

## 2013-10-15 MED ORDER — ONDANSETRON HCL 4 MG PO TABS: 4.0000 mg | ORAL_TABLET | Freq: Four times a day (QID) | ORAL | Status: DC | PRN

## 2013-10-15 MED ORDER — AMLODIPINE BESYLATE 5 MG PO TABS
5.0000 mg | ORAL_TABLET | Freq: Every day | ORAL | Status: DC
  Administered 2013-10-16 – 2013-10-18 (×3): 5 mg via ORAL
  Filled 2013-10-15 (×3): qty 1

## 2013-10-15 MED ORDER — ACETAMINOPHEN 650 MG RE SUPP: 650.0000 mg | Freq: Four times a day (QID) | RECTAL | Status: DC | PRN

## 2013-10-15 MED ORDER — SODIUM CHLORIDE 0.9 % IJ SOLN
3.0000 mL | Freq: Two times a day (BID) | INTRAMUSCULAR | Status: DC
  Administered 2013-10-16 – 2013-10-18 (×4): 3 mL via INTRAVENOUS

## 2013-10-15 MED ORDER — CARVEDILOL 25 MG PO TABS
25.0000 mg | ORAL_TABLET | Freq: Two times a day (BID) | ORAL | Status: DC
  Administered 2013-10-16 – 2013-10-18 (×6): 25 mg via ORAL
  Filled 2013-10-15 (×7): qty 1

## 2013-10-15 MED ORDER — ACETAMINOPHEN 325 MG PO TABS: 650.0000 mg | ORAL_TABLET | Freq: Four times a day (QID) | ORAL | Status: DC | PRN

## 2013-10-15 MED ORDER — PANTOPRAZOLE SODIUM 40 MG PO TBEC
40.0000 mg | DELAYED_RELEASE_TABLET | Freq: Two times a day (BID) | ORAL | Status: DC
  Administered 2013-10-16 – 2013-10-18 (×4): 40 mg via ORAL
  Filled 2013-10-15 (×4): qty 1

## 2013-10-15 MED ORDER — BUPROPION HCL ER (SR) 150 MG PO TB12
150.0000 mg | ORAL_TABLET | Freq: Two times a day (BID) | ORAL | Status: DC
  Administered 2013-10-16 – 2013-10-18 (×6): 150 mg via ORAL
  Filled 2013-10-15 (×7): qty 1

## 2013-10-15 MED ORDER — ATORVASTATIN CALCIUM 20 MG PO TABS
20.0000 mg | ORAL_TABLET | Freq: Every day | ORAL | Status: DC
  Administered 2013-10-16 – 2013-10-17 (×2): 20 mg via ORAL
  Filled 2013-10-15 (×3): qty 1

## 2013-10-15 MED ORDER — PANTOPRAZOLE SODIUM 40 MG IV SOLR
40.0000 mg | Freq: Once | INTRAVENOUS | Status: AC
  Administered 2013-10-16: 40 mg via INTRAVENOUS
  Filled 2013-10-15: qty 40

## 2013-10-15 NOTE — ED Provider Notes (Signed)
CSN: [id number]     Arrival date & time 10/15/13  1655 History   First MD Initiated Contact with Patient 10/15/13 1805     Chief Complaint  Patient presents with  . abnormal labs       HPI Pt was seen at 1810.  Per pt, c/o gradual onset and worsening of persistent generalized weakness/fatigue for the past 3 weeks, worse over the past several days. Has been associated with SOB. Pt states he was evaluated by his Cards MD today, had labs drawn, "was told my Hgb was low" and "to come to the ER to get admitted." Denies CP/palpitations, no cough, no abd pain, no N/V/D, no back pain, no focal motor weakness, no tingling/numnbess in extremities, no black or blood in stools.    Past Medical History  Diagnosis Date  . Hypertension   . Dyslipidemia   . Obesity   . Atrial fibrillation     Amiodarone started 10/2011; Coumadin  . OSA (obstructive sleep apnea)   . Tobacco abuse   . COPD (chronic obstructive pulmonary disease)   . Asthma   . NICM (nonischemic cardiomyopathy)     LHC 4/14:  minimal CAD  . Chronic systolic heart failure     a. Echo 7/13: EF 25%;  b. echo 04/2012:  Mild LVH, EF 30-35%, Gr 1 DD, mild AI, mild MR, mild LAE  . 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 St. Jude ICD implantation 10/03/12.  . Arthritis   . Chronic anticoagulation    Past Surgical History  Procedure Laterality Date  . Total hip arthroplasty  1999  . Cardioversion  2011   Family History  Problem Relation Age of Onset  . Other Father     deceased- unknow reason  . Hypertension Mother   . Lung cancer Brother    History  Substance Use Topics  . Smoking status: Current Every Day Smoker -- 0.50 packs/day for 40 years    Types: Cigarettes  . Smokeless tobacco: Never Used     Comment: cutting back to 2 cigs daily  i  am quitting  on my own "  . Alcohol Use: No    Review of Systems ROS: Statement: All systems negative except as marked or noted in the HPI;  Constitutional: Negative for fever and chills. +generalized weakness/fatigue.; ; Eyes: Negative for eye pain, redness and discharge. ; ; ENMT: Negative for ear pain, hoarseness, nasal congestion, sinus pressure and sore throat. ; ; Cardiovascular: Negative for chest pain, palpitations, diaphoresis, and peripheral edema. ; ; Respiratory: +SOB. Negative for cough, wheezing and stridor. ; ; Gastrointestinal: Negative for nausea, vomiting, diarrhea, abdominal pain, blood in stool, hematemesis, jaundice and rectal bleeding. . ; ; Genitourinary: Negative for dysuria, flank pain and hematuria. ; ; Musculoskeletal: Negative for back pain and neck pain. Negative for swelling and trauma.; ; Skin: Negative for pruritus, rash, abrasions, blisters, bruising and skin lesion.; ; Neuro: Negative for headache, lightheadedness and neck stiffness. Negative for altered level of consciousness , altered mental status, extremity weakness, paresthesias, involuntary movement, seizure and syncope.     Allergies  Review of patient's allergies indicates no known allergies.  Home Medications   Prior to Admission medications   Medication Sig Start Date End Date Taking? Authorizing Provider  allopurinol (ZYLOPRIM) 300 MG tablet Take 300 mg by mouth 2 (two) times daily.  12/18/10  Yes Historical Provider, MD  amiodarone (PACERONE) 200 MG tablet TAKE 1 TABLET BY MOUTH DAILY.  Yes Fay Records, MD  amLODipine (NORVASC) 5 MG tablet TAKE 1 TABLET BY MOUTH EVERY DAY   Yes Fay Records, MD  atorvastatin (LIPITOR) 20 MG tablet TAKE 1 TABLET BY MOUTH EVERY DAY 08/04/13  Yes Fay Records, MD  buPROPion [facility] SR) 150 MG 12 hr tablet Take 1 tablet (150 mg total) by mouth 2 (two) times daily. 08/18/13  Yes Fay Records, MD  carvedilol (COREG) 25 MG tablet Take 1 tablet (25 mg total) by mouth 2 (two) times daily. 06/17/12  Yes Fay Records, MD  hydrALAZINE (APRESOLINE) 50 MG tablet TAKE 1 TABLET (50 MG TOTAL) BY MOUTH 3 (THREE) TIMES DAILY.  09/15/13  Yes Fay Records, MD  KLOR-CON M20 20 MEQ tablet TAKE 1 TABLET BY MOUTH EVERY DAY 07/01/13  Yes Fay Records, MD  lisinopril (PRINIVIL,ZESTRIL) 40 MG tablet TAKE 1 TABLET BY MOUTH EVERY DAY 09/30/13  Yes Fay Records, MD  niacin (NIASPAN) 1000 MG CR tablet TAKE 1 TABLET BY MOUTH AT BEDTIME   Yes Fay Records, MD  Rivaroxaban (XARELTO) 20 MG TABS tablet Take 1 tablet (20 mg total) by mouth daily. 01/31/13  Yes Fay Records, MD  spironolactone (ALDACTONE) 25 MG tablet Take 12.5 mg by mouth daily.   Yes Historical Provider, MD  VIAGRA 100 MG tablet Take 100 mg by mouth daily as needed for erectile dysfunction. As directed 10/28/12  Yes Historical Provider, MD   BP 122/69  Pulse 82  Temp(Src) 98.7 F (37.1 C) (Oral)  Resp 23  Ht 6\' 1"  (1.854 m)  Wt 290 lb (131.543 kg)  BMI 38.27 kg/m2  SpO2 100% Physical Exam 1815: Physical examination:  Nursing notes reviewed; Vital signs and O2 SAT reviewed;  Constitutional: Well developed, Well nourished, Well hydrated, In no acute distress; Head:  Normocephalic, atraumatic; Eyes: EOMI, PERRL, No scleral icterus. Conjunctiva pale.; ENMT: Mouth and pharynx normal, Mucous membranes moist; Neck: Supple, Full range of motion, No lymphadenopathy; Cardiovascular: Irregular irregular rate and rhythm, No gallop; Respiratory: Breath sounds clear & equal bilaterally, No wheezes.  Speaking full sentences with ease, Normal respiratory effort/excursion; Chest: Nontender, Movement normal; Abdomen: Soft, Nontender, Nondistended, Normal bowel sounds. Rectal exam performed w/permission of pt and ED RN chaperone present.  Anal tone normal.  Non-tender, soft brown stool in rectal vault, heme neg.  No fissures, no bleeding external hemorrhoids, no palp masses.; Genitourinary: No CVA tenderness; Extremities: Pulses normal, No tenderness, +2 pedal edema bilat. No calf asymmetry.; Neuro: AA&Ox3, Major CN grossly intact.  Speech clear. No gross focal motor or sensory deficits in  extremities.; Skin: Color normal, Warm, Dry.   ED Course  Procedures     EKG Interpretation None      MDM  MDM Reviewed: previous chart, nursing note and vitals Reviewed previous: labs and x-ray Interpretation: labs Total time providing critical care: 30-74 minutes. This excludes time spent performing separately reportable procedures and services. Consults: admitting MD   CRITICAL CARE Performed by: Alfonzo Feller Total critical care time: 35 Critical care time was exclusive of separately billable procedures and treating other patients. Critical care was necessary to treat or prevent imminent or life-threatening deterioration. Critical care was time spent personally by me on the following activities: development of treatment plan with patient and/or surrogate as well as nursing, discussions with consultants, evaluation of patient's response to treatment, examination of patient, obtaining history from patient or surrogate, ordering and performing treatments and interventions, ordering and review of laboratory studies,  ordering and review of radiographic studies, pulse oximetry and re-evaluation of patient's condition.   Results for orders placed during the hospital encounter of 10/15/13  CBC      Result Value Ref Range   WBC 7.2  4.0 - 10.5 K/uL   RBC 3.03 (*) 4.22 - 5.81 MIL/uL   Hemoglobin 5.1 (*) 13.0 - 17.0 g/dL   HCT 19.5 (*) 39.0 - 52.0 %   MCV 64.4 (*) 78.0 - 100.0 fL   MCH 16.8 (*) 26.0 - 34.0 pg   MCHC 26.2 (*) 30.0 - 36.0 g/dL   RDW 21.9 (*) 11.5 - 15.5 %   Platelets 270  150 - 400 K/uL  BASIC METABOLIC PANEL      Result Value Ref Range   Sodium 141  137 - 147 mEq/L   Potassium 4.2  3.7 - 5.3 mEq/L   Chloride 103  96 - 112 mEq/L   CO2 21  19 - 32 mEq/L   Glucose, Bld 108 (*) 70 - 99 mg/dL   BUN 23  6 - 23 mg/dL   Creatinine, Ser 1.86 (*) 0.50 - 1.35 mg/dL   Calcium 9.3  8.4 - 10.5 mg/dL   GFR calc non Af Amer 37 (*) >90 mL/min   GFR calc Af Amer 43 (*)  >90 mL/min   Anion gap 17 (*) 5 - 15  POC OCCULT BLOOD, ED      Result Value Ref Range   Fecal Occult Bld NEGATIVE  NEGATIVE  TYPE AND SCREEN      Result Value Ref Range   ABO/RH(D) B POS     Antibody Screen NEG     Sample Expiration 10/18/2013     Unit Number [id number]     Blood Component Type RED CELLS,LR     Unit division 00     Status of Unit ALLOCATED     Transfusion Status OK TO TRANSFUSE     Crossmatch Result Compatible     Unit Number [id number]     Blood Component Type RED CELLS,LR     Unit division 00     Status of Unit ALLOCATED     Transfusion Status OK TO TRANSFUSE     Crossmatch Result Compatible    PREPARE RBC (CROSSMATCH)      Result Value Ref Range   Order Confirmation ORDER PROCESSED BY BLOOD BANK    ABO/RH      Result Value Ref Range   ABO/RH(D) B POS     Dg Chest 2 View 10/15/2013   CLINICAL DATA:  Shortness of breath  EXAM: CHEST  2 VIEW  COMPARISON:  October 04, 2012  FINDINGS: The heart size and mediastinal contours are stable. Cardiac pacemaker is unchanged. There is no focal infiltrate, pulmonary edema, or pleural effusion. The visualized skeletal structures are stable.  IMPRESSION: No active cardiopulmonary disease.   Electronically Signed   By: Abelardo Diesel M.D.   On: 10/15/2013 15:58    1920:  H/H lower than previous. Stool is heme negative. VS stable.  Will transfuse PRBC's. Dx and testing d/w pt and family.  Questions answered.  Verb understanding, agreeable to admit.  T/C to [facility], case discussed, including:  HPI, pertinent PM/SHx, VS/PE, dx testing, ED course and treatment:  Agreeable to admit, requests to write temporary orders, obtain tele bed to team 10.     Alfonzo Feller, DO 10/16/13 1626

## 2013-10-15 NOTE — ED Notes (Signed)
Attempted to gain IV access, unsuccessful, will have another RN attempt.

## 2013-10-15 NOTE — Progress Notes (Signed)
Set up new CPAP for patient with large mask. Settings are auto 20/4. Instructed patient how to place on and off by himself at his request but told him to call if he needed any help at all.

## 2013-10-15 NOTE — Progress Notes (Signed)
Report taken on pt from RN in ED.

## 2013-10-15 NOTE — ED Notes (Signed)
Pt monitored by 5-lead, bp cuff, and pulse ox.

## 2013-10-15 NOTE — Progress Notes (Addendum)
Jason Russell Date of Birth: 1950-04-18 Medical Record #[id number]  History of Present Illness: Jason Russell is seen back today for a 3 1/2 month check. Seen for Dr. Harrington Challenger. He is a 63 year old male with HTN, OSA, PAF, systolic HF, nonischemic cardiomyopathy and on Amiodarone - not a candidate for Tikosyn given prolonged QT. 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 No obstructive disease by cath in April of 2014. Does have an ICD in place. He remains on chronic anticoagulation.   Last seen here at the end of March - seemed to be doing ok - caring for his wife with dementia.  Comes back today. Here alone. This is actually a work in visit - dizzy and short of breath over the past 3 weeks. Feels like he did when he was out of rhythm last year. No chest pain. Just with walking in here, he got short of breath. Nothing that sounds like vertigo. No ICD discharges. No passing out. Has not had his device checked since implant.    Current Outpatient Prescriptions  Medication Sig Dispense Refill  . allopurinol (ZYLOPRIM) 300 MG tablet Take 300 mg by mouth 2 (two) times daily.       [name] amiodarone (PACERONE) 200 MG tablet TAKE 1 TABLET BY MOUTH DAILY.  90 tablet  1  . amLODipine (NORVASC) 5 MG tablet TAKE 1 TABLET BY MOUTH EVERY DAY  90 tablet  1  . atorvastatin (LIPITOR) 20 MG tablet TAKE 1 TABLET BY MOUTH EVERY DAY  90 tablet  0  . buPROPion (WELLBUTRIN SR) 150 MG 12 hr tablet Take 1 tablet (150 mg total) by mouth 2 (two) times daily.  180 tablet  3  . hydrALAZINE (APRESOLINE) 50 MG tablet TAKE 1 TABLET (50 MG TOTAL) BY MOUTH 3 (THREE) TIMES DAILY.  270 tablet  1  . KLOR-CON M20 20 MEQ tablet TAKE 1 TABLET BY MOUTH EVERY DAY  30 tablet  3  . lisinopril (PRINIVIL,ZESTRIL) 40 MG tablet TAKE 1 TABLET BY MOUTH EVERY DAY  90 tablet  0  . niacin (NIASPAN) 1000 MG CR tablet TAKE 1 TABLET BY MOUTH AT BEDTIME  90 tablet  0  . Rivaroxaban (XARELTO) 20 MG TABS tablet Take 1 tablet (20 mg total) by mouth daily.  90  tablet  3  . spironolactone (ALDACTONE) 25 MG tablet TAKE 1/2 TABLET BY MOUTH DAILY.  45 tablet  1  . carvedilol (COREG) 25 MG tablet Take 1 tablet (25 mg total) by mouth 2 (two) times daily.  180 tablet  3  . VIAGRA 100 MG tablet Take 100 mg by mouth as needed. As directed       No current facility-administered medications for this visit.    No Known Allergies  Past Medical History  Diagnosis Date  . Hypertension   . Dyslipidemia   . Obesity   . Atrial fibrillation     Amiodarone started 10/2011; Coumadin  . OSA (obstructive sleep apnea)   . Tobacco abuse   . COPD (chronic obstructive pulmonary disease)   . Asthma   . NICM (nonischemic cardiomyopathy)     LHC 4/14:  minimal CAD  . Chronic systolic heart failure     a. Echo 7/13: EF 25%;  b. echo 04/2012:  Mild LVH, EF 30-35%, Gr 1 DD, mild AI, mild MR, mild LAE  . 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 St. Jude ICD implantation 10/03/12.  . Arthritis  Past Surgical History  Procedure Laterality Date  . Total hip arthroplasty  1999  . Cardioversion  2011    History  Smoking status  . Current Every Day Smoker -- 0.50 packs/day for 40 years  . Types: Cigarettes  Smokeless tobacco  . Never Used    Comment: cutting back to 2 cigs daily  i  am quitting  on my own "    History  Alcohol Use No    Family History  Problem Relation Age of Onset  . Other Father     deceased- unknow reason  . Hypertension Mother   . Lung cancer Brother     Review of Systems: The review of systems is per the HPI.  All other systems were reviewed and are negative.  Physical Exam: BP 130/60  Pulse 84  Ht 6\' 1"  (1.854 m)  Wt 290 lb 12.8 oz (131.906 kg)  BMI 38.37 kg/m2 Oxygen sat 98% and with walking does not go down. HR goes up to a max of 114. He was short of breath.  Patient is very pleasant and in no acute distress. His weight is down considerably but he remains obese. Skin is warm and dry.  Color is normal.  HEENT is unremarkable. Normocephalic/atraumatic. PERRL. Sclera are nonicteric. Neck is supple. No masses. No JVD. Lungs are clear. Cardiac exam shows a regular rate and rhythm. Abdomen is soft. Extremities are without edema. Gait and ROM are intact. No gross neurologic deficits noted.  Wt Readings from Last 3 Encounters:  10/15/13 290 lb 12.8 oz (131.906 kg)  06/27/13 309 lb (140.161 kg)  03/14/13 321 lb 9.6 oz (145.877 kg)    LABORATORY DATA/PROCEDURES:  Lab Results  Component Value Date   WBC 6.4 03/03/2013   HGB 11.0* 03/03/2013   HCT 32.9* 03/03/2013   PLT 177.0 03/03/2013   GLUCOSE 95 03/03/2013   CHOL 150 12/23/2012   TRIG 73.0 12/23/2012   HDL 46.60 12/23/2012   LDLCALC 89 12/23/2012   ALT 17 06/27/2013   AST 22 06/27/2013   NA 139 03/03/2013   K 3.5 03/03/2013   CL 104 03/03/2013   CREATININE 1.6* 03/03/2013   BUN 19 03/03/2013   CO2 26 03/03/2013   TSH 1.95 07/25/2012   INR 3.1 01/31/2013   HGBA1C 5.9* 11/01/2011    BNP (last 3 results) No results found for this basename: PROBNP,  in the last 8760 hours   Echo Study Conclusions from January 2014  - Left ventricle: The cavity size was mildly dilated. Wall thickness was increased in a pattern of mild LVH. Systolic function was moderately to severely reduced. The estimated ejection fraction was 35%, in the range of 30% to 35%. Diffuse hypokinesis. Doppler parameters are consistent with abnormal left ventricular relaxation (grade 1 diastolic dysfunction). - Aortic valve: Mild regurgitation. - Mitral valve: Mild regurgitation. - Left atrium: The atrium was mildly dilated. - Atrial septum: There was an atrial septal aneurysm.  Procedure: Left Heart Cath, Selective Coronary Angiography  Indication: Cardiomyopathy  Procedural Details: Allen's Test was positive. The right wrist was prepped, draped, and anesthetized with 1% lidocaine. Using the modified Seldinger technique, a 5 French sheath was introduced into the  right radial artery. 3 mg of verapamil was administered through the sheath, weight-based unfractionated heparin was administered intravenously. Procedure was difficult due to tortuosity in the brachiocephalic artery making engagement of the right coronary challenging due to difficulty torqueing the catheter. Standard Judkins catheters were used for selective coronary angiography. Catheter exchanges  were performed over an exchange length guidewire. There were no immediate procedural complications. A TR band was used for radial hemostasis at the completion of the procedure. The patient was transferred to the post catheterization recovery area for further monitoring.  Procedural Findings:  Hemodynamics:  AO 158/78  LV 159/24  Coronary angiography:  Coronary dominance: right  Left mainstem: Short, no significant disease.  Left anterior descending (LAD): Luminal irregularities.  Left circumflex (LCx): No significant CAD.  Right coronary artery (RCA): Difficult to engage from right radial access due to tortuosity of the brachiocephalic artery and difficulty torquing catheter. The artery was engaged subselectively, no obstructive coronary disease was noted.  Left ventriculography: Not done due to contrast used to find the RCA.  Final Conclusions: No obstructive CAD. If needs catheterization in the future, likely should go from the right femoral. 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  Loralie Champagne  07/15/2012, 10:31 AM    Assessment / Plan: 1. Dyspnea - most likely multifactorial - he is on amiodarone. Has COPD and continues to smoke. Does not really look like this is a volume issue. Does not look like a PE - he is on chronic anticoagulation. Will check extensive labs. Check CXR and send for PFTs with diffusion and ABG. Dr. Harrington Challenger was in the office during his visit and has seen the patient as well today. She is in agreement with this plan.   2. Chronic systolic heart  failure- weight is down. No swelling. Check BNP. May need to consider rechecking ECHO. We have turned on his CorVue today.   3. PAF - on amiodarone - he is in sinus today by EKG today and device check- his device is a single chamber lead - does not look like he has had any high rates. Will see what his labs and tests show - further disposition to follow.   4. Chronic anticoagulation  Patient is agreeable to this plan and will call if any problems develop in the interim.   Burtis Junes, RN, [place] [address] [place], [place]  [phone]   Addendum:  I have put him out of work - he has not been to work since last Friday due to his symptoms. Will need to get his testing completed first.   Burtis Junes, RN, [place] [address] [place], [place]  [phone]     Addendum:  His labs were called to our office - his HGB is 5.2 with a HCT of 18.3  He is being called to go directly to the ER for further evaluation - this is obviously what is wrong with him and causing his shortness of breath.   Unfortunately, his home number is not accepting messages. We were able to leave a message on his mobile phone.

## 2013-10-15 NOTE — H&P (Signed)
[facility] History and Physical  Patient: Jason Russell  RSW:[id number]  DOB: 09-23-1950  DOS: the patient was seen and examined on 10/15/2013 PCP: [name], MD  Chief Complaint: Exertional Shortness of breath and fatigue  HPI: Berl Bonfanti is a 63 y.o. male with Past medical history of hypertension, nonischemic cardiomyopathy, atrial fibrillation on amiodarone and Xarelto, ICD placement, sleep apnea on CPAP, active smoker. Patient presented with complaints of shortness of breath that has been ongoing since last one week. Shortness of breath primarily occurs on exertion and is associated with sense of fatigue and tiredness. Since last 2 weeks he has been having progressively worsening fatigue. He denies any chest pain or heaviness orthopnea or PND. He denies any cough or fever or chills.  He mentions he is compliant with his medication and no recent change in his medications.  He denies any sick contact. He denies any travel history. He denies seeing any blood anywhere he denies seeing any black color bowel movements or diarrhea. Denies any vomiting or nausea or burning urination. Denies any fall or trauma. Denies being on any antibiotics recently. He has not seen any PCP since last 2 years and has not had gone through any colonoscopy so far. He denies any prior history of anemia or blood transfusion. He is on rivaroxaban for 9 months at least and prior to that was on warfarin and did not have any bleeding.  The patient is coming from home. And at his baseline independent for most of his ADL.  Review of Systems: as mentioned in the history of present illness.  A Comprehensive review of the other systems is negative.  Past Medical History  Diagnosis Date  . Hypertension   . Dyslipidemia   . Obesity   . Atrial fibrillation     Amiodarone started 10/2011; Coumadin  . OSA (obstructive sleep apnea)   . Tobacco abuse   . COPD (chronic obstructive pulmonary disease)    . Asthma   . NICM (nonischemic cardiomyopathy)     LHC 4/14:  minimal CAD  . Chronic systolic heart failure     a. Echo 7/13: EF 25%;  b. echo 04/2012:  Mild LVH, EF 30-35%, Gr 1 DD, mild AI, mild MR, mild LAE  . 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 St. Jude ICD implantation 10/03/12.  . Arthritis   . Chronic anticoagulation    Past Surgical History  Procedure Laterality Date  . Total hip arthroplasty  1999  . Cardioversion  2011   Social History:  reports that he has been smoking Cigarettes.  He has a 20 pack-year smoking history. He has never used smokeless tobacco. He reports that he does not drink alcohol or use illicit drugs.  No Known Allergies  Family History  Problem Relation Age of Onset  . Other Father     deceased- unknow reason  . Hypertension Mother   . Lung cancer Brother     Prior to Admission medications   Medication Sig Start Date End Date Taking? Authorizing Provider  allopurinol (ZYLOPRIM) 300 MG tablet Take 300 mg by mouth 2 (two) times daily.  12/18/10  Yes Historical Provider, MD  amiodarone (PACERONE) 200 MG tablet TAKE 1 TABLET BY MOUTH DAILY.   Yes Fay Records, MD  amLODipine (NORVASC) 5 MG tablet TAKE 1 TABLET BY MOUTH EVERY DAY   Yes Fay Records, MD  atorvastatin (LIPITOR) 20 MG tablet TAKE 1 TABLET BY MOUTH EVERY DAY  08/04/13  Yes Fay Records, MD  buPROPion (WELLBUTRIN SR) 150 MG 12 hr tablet Take 1 tablet (150 mg total) by mouth 2 (two) times daily. 08/18/13  Yes Fay Records, MD  carvedilol (COREG) 25 MG tablet Take 1 tablet (25 mg total) by mouth 2 (two) times daily. 06/17/12  Yes Fay Records, MD  hydrALAZINE (APRESOLINE) 50 MG tablet TAKE 1 TABLET (50 MG TOTAL) BY MOUTH 3 (THREE) TIMES DAILY. 09/15/13  Yes Fay Records, MD  KLOR-CON M20 20 MEQ tablet TAKE 1 TABLET BY MOUTH EVERY DAY 07/01/13  Yes Fay Records, MD  lisinopril (PRINIVIL,ZESTRIL) 40 MG tablet TAKE 1 TABLET BY MOUTH EVERY DAY 09/30/13  Yes Fay Records, MD  niacin (NIASPAN) 1000 MG CR tablet TAKE 1 TABLET BY MOUTH AT BEDTIME   Yes Fay Records, MD  Rivaroxaban (XARELTO) 20 MG TABS tablet Take 1 tablet (20 mg total) by mouth daily. 01/31/13  Yes Fay Records, MD  spironolactone (ALDACTONE) 25 MG tablet Take 12.5 mg by mouth daily.   Yes Historical Provider, MD  VIAGRA 100 MG tablet Take 100 mg by mouth daily as needed for erectile dysfunction. As directed 10/28/12  Yes Historical Provider, MD    Physical Exam: Filed Vitals:   10/15/13 1915 10/15/13 1930 10/15/13 1951 10/15/13 2000  BP: 125/69 127/73 118/69 132/69  Pulse: 80 80 76 74  Temp:   98.3 F (36.8 C)   TempSrc:      Resp: 23 19 22 14   Height:      Weight:      SpO2: 100% 100% 100% 100%    General: Alert, Awake and Oriented to Time, Place and Person. Appear in mild distress Eyes: PERRL ENT: Oral Mucosa clear moist. Neck: no JVD Cardiovascular: S1 and S2 Present, no Murmur, Peripheral Pulses Present Respiratory: Bilateral Air entry equal and Decreased, Clear to Auscultation, noCrackles, no wheezes Abdomen: Bowel Sound Present, Soft and Non tender, no back pain Skin: no Rash Extremities: no Pedal edema, no calf tenderness Neurologic: Grossly no focal neuro deficit.  Labs on Admission:  CBC:  Recent Labs Lab 10/15/13 1511 10/15/13 1715  WBC 9.0 7.2  HGB 5.2 Repeated and verified X2.* 5.1*  HCT 18.3 Repeated and verified X2.* 19.5*  MCV 60.4* 64.4*  PLT 303.0 270    CMP     Component Value Date/Time   NA 141 10/15/2013 1715   K 4.2 10/15/2013 1715   CL 103 10/15/2013 1715   CO2 21 10/15/2013 1715   GLUCOSE 108* 10/15/2013 1715   BUN 23 10/15/2013 1715   CREATININE 1.86* 10/15/2013 1715   CALCIUM 9.3 10/15/2013 1715   PROT 6.5 06/27/2013 1652   ALBUMIN 3.6 06/27/2013 1652   AST 22 06/27/2013 1652   ALT 17 06/27/2013 1652   ALKPHOS 102 06/27/2013 1652   BILITOT 0.3 06/27/2013 1652   GFRNONAA 37* 10/15/2013 1715   GFRAA 43* 10/15/2013 1715    No results found for  this basename: LIPASE, AMYLASE,  in the last 168 hours No results found for this basename: AMMONIA,  in the last 168 hours  No results found for this basename: CKTOTAL, CKMB, CKMBINDEX, TROPONINI,  in the last 168 hours BNP (last 3 results)  Recent Labs  10/15/13 1511  PROBNP 29.0    Radiological Exams on Admission: Dg Chest 2 View  10/15/2013   CLINICAL DATA:  Shortness of breath  EXAM: CHEST  2 VIEW  COMPARISON:  October 04, 2012  FINDINGS: The heart size and mediastinal contours are stable. Cardiac pacemaker is unchanged. There is no focal infiltrate, pulmonary edema, or pleural effusion. The visualized skeletal structures are stable.  IMPRESSION: No active cardiopulmonary disease.   Electronically Signed   By: Abelardo Diesel M.D.   On: 10/15/2013 15:58    Assessment/Plan Principal Problem:   Symptomatic anemia Active Problems:   OVERWEIGHT/OBESITY   OBSTRUCTIVE SLEEP APNEA   HYPERTENSION, BENIGN   Chronic anticoagulation   Chronic systolic heart failure   Nonischemic cardiomyopathy   1. Symptomatic anemia Patient is presenting with complaints of fatigue and shortness of breath. He was found to have hemoglobin of 5.1 with MCV in the 60s. He has appropriately elevated INR and APTT for being on rivaroxaban. Appropriately elevated reticulocyte count. Normal LDH. Elevated fibrinogen levels. And normal LFT.Hemoccult is negative. With normal LDH and elevated fibrinogen possibility of hemolysis is less likely. With appropriately elevated reticulocyte count bone marrow suppression is less likely. Patient does not have any back pain or abdominal pain to suggest any retroperitoneal bleed, chest x-ray is also negative and there is no, or swelling in his legs. With low MCV that is high possibility the patient has occult slow GI bleeding, although Hemoccult is negative. At present patient is receiving 2 units of PRBC, after that we would recheck his hemoglobin if it is appropriately responded  than presence of active bleeding is ruled out. If it is not appropriate response then I will obtain a CT of his abdomen look for retroperitoneal hematoma. Monitor iron and ferritin and also start the patient on iron supplementation as well as folic acid O12. Protonix every 12 hours. Hold Xarelto.  2. A. fib Continue amiodarone, hold Xarelto, monitor on telemetry  3. nonischemic cardiomyopathy EF 20-30%, at present continue his home medications, monitor for voiding overload due to receiving blood transfusions.  DVT Prophylaxis: mechanical compression device Nutrition:  clear liquid diet, n.p.o. after midnight   Code Status:  full   Disposition: Admitted to inpatient in telemetry unit.  Author: Berle Mull, MD [facility] Pager: [phone] 10/15/2013, 8:22 PM    If 7PM-7AM, please contact night-coverage [email or web address] Password [email or web address]  **Disclaimer: This note may have been dictated with voice recognition software. Similar sounding words can inadvertently be transcribed and this note may contain transcription errors which may not have been corrected upon publication of note.**

## 2013-10-15 NOTE — ED Notes (Signed)
Admitting at bedside 

## 2013-10-15 NOTE — Patient Instructions (Addendum)
Stay on your current medicines for now.   We are checking labs today  We will arrange for a breathing test - ASAP  Please go to [facility] [place] on the first floor for a chest Xray - you may walk in.   After we get your tests back, we will then decide "what we need to do next"  Call the [place] office at [phone] if you have any questions, problems or concerns.

## 2013-10-15 NOTE — ED Notes (Signed)
Pt states he had blood work drawn at PCP office today and was called and told to come to ED for low Hgb.  C/o feeling tired.

## 2013-10-16 ENCOUNTER — Encounter (HOSPITAL_COMMUNITY): Payer: Self-pay | Admitting: General Practice

## 2013-10-16 DIAGNOSIS — D509 Iron deficiency anemia, unspecified: Secondary | ICD-10-CM

## 2013-10-16 LAB — CBC WITH DIFFERENTIAL/PLATELET
BASOS ABS: 0 10*3/uL (ref 0.0–0.1)
BASOS ABS: 0 10*3/uL (ref 0.0–0.1)
BASOS PCT: 1 % (ref 0–1)
Basophils Absolute: 0.1 10*3/uL (ref 0.0–0.1)
Basophils Relative: 0 % (ref 0–1)
Basophils Relative: 0 % (ref 0–1)
EOS ABS: 0.1 10*3/uL (ref 0.0–0.7)
EOS ABS: 0.1 10*3/uL (ref 0.0–0.7)
EOS PCT: 1 % (ref 0–5)
Eosinophils Absolute: 0.1 10*3/uL (ref 0.0–0.7)
Eosinophils Relative: 1 % (ref 0–5)
Eosinophils Relative: 1 % (ref 0–5)
HCT: 23.9 % — ABNORMAL LOW (ref 39.0–52.0)
HCT: 26.1 % — ABNORMAL LOW (ref 39.0–52.0)
HEMATOCRIT: 19.8 % — AB (ref 39.0–52.0)
HEMOGLOBIN: 6.7 g/dL — AB (ref 13.0–17.0)
Hemoglobin: 5.4 g/dL — CL (ref 13.0–17.0)
Hemoglobin: 7.5 g/dL — ABNORMAL LOW (ref 13.0–17.0)
LYMPHS PCT: 14 % (ref 12–46)
LYMPHS PCT: 19 % (ref 12–46)
LYMPHS PCT: 20 % (ref 12–46)
Lymphs Abs: 1.4 10*3/uL (ref 0.7–4.0)
Lymphs Abs: 1.3 10*3/uL (ref 0.7–4.0)
Lymphs Abs: 1.1 10*3/uL (ref 0.7–4.0)
MCH: 17.6 pg — ABNORMAL LOW (ref 26.0–34.0)
MCH: 18.5 pg — AB (ref 26.0–34.0)
MCH: 19.6 pg — ABNORMAL LOW (ref 26.0–34.0)
MCHC: 27.3 g/dL — AB (ref 30.0–36.0)
MCHC: 28 g/dL — AB (ref 30.0–36.0)
MCHC: 28.7 g/dL — ABNORMAL LOW (ref 30.0–36.0)
MCV: 64.7 fL — ABNORMAL LOW (ref 78.0–100.0)
MCV: 65.8 fL — ABNORMAL LOW (ref 78.0–100.0)
MCV: 68.1 fL — ABNORMAL LOW (ref 78.0–100.0)
MONO ABS: 0.7 10*3/uL (ref 0.1–1.0)
Monocytes Absolute: 1.2 10*3/uL — ABNORMAL HIGH (ref 0.1–1.0)
Monocytes Absolute: 1 10*3/uL (ref 0.1–1.0)
Monocytes Relative: 16 % — ABNORMAL HIGH (ref 3–12)
Monocytes Relative: 10 % (ref 3–12)
Monocytes Relative: 13 % — ABNORMAL HIGH (ref 3–12)
NEUTROS PCT: 63 % (ref 43–77)
NEUTROS PCT: 69 % (ref 43–77)
Neutro Abs: 4.5 10*3/uL (ref 1.7–7.7)
Neutro Abs: 4.4 10*3/uL (ref 1.7–7.7)
Neutro Abs: 5.5 10*3/uL (ref 1.7–7.7)
Neutrophils Relative %: 72 % (ref 43–77)
PLATELETS: 246 10*3/uL (ref 150–400)
Platelets: 228 10*3/uL (ref 150–400)
Platelets: 235 10*3/uL (ref 150–400)
RBC: 3.06 MIL/uL — ABNORMAL LOW (ref 4.22–5.81)
RBC: 3.63 MIL/uL — ABNORMAL LOW (ref 4.22–5.81)
RBC: 3.83 MIL/uL — AB (ref 4.22–5.81)
RDW: 22.8 % — AB (ref 11.5–15.5)
RDW: 23.2 % — AB (ref 11.5–15.5)
RDW: 23.4 % — AB (ref 11.5–15.5)
WBC: 6.6 10*3/uL (ref 4.0–10.5)
WBC: 7.2 10*3/uL (ref 4.0–10.5)
WBC: 7.7 10*3/uL (ref 4.0–10.5)

## 2013-10-16 LAB — COMPREHENSIVE METABOLIC PANEL
ALT: 18 U/L (ref 0–53)
ANION GAP: 16 — AB (ref 5–15)
AST: 22 U/L (ref 0–37)
Albumin: 3.6 g/dL (ref 3.5–5.2)
Alkaline Phosphatase: 125 U/L — ABNORMAL HIGH (ref 39–117)
BILIRUBIN TOTAL: 0.4 mg/dL (ref 0.3–1.2)
BUN: 22 mg/dL (ref 6–23)
CALCIUM: 9.3 mg/dL (ref 8.4–10.5)
CHLORIDE: 101 meq/L (ref 96–112)
CO2: 22 mEq/L (ref 19–32)
Creatinine, Ser: 1.51 mg/dL — ABNORMAL HIGH (ref 0.50–1.35)
GFR calc Af Amer: 56 mL/min — ABNORMAL LOW (ref >90)
GFR calc non Af Amer: 48 mL/min — ABNORMAL LOW (ref >90)
Glucose, Bld: 100 mg/dL — ABNORMAL HIGH (ref 70–99)
Potassium: 4.1 mEq/L (ref 3.7–5.3)
Sodium: 139 mEq/L (ref 137–147)
Total Protein: 7 g/dL (ref 6.0–8.3)

## 2013-10-16 LAB — IRON AND TIBC
Iron: 10 ug/dL — ABNORMAL LOW (ref 42–135)
SATURATION RATIOS: 2 % — AB (ref 20–55)
TIBC: 484 ug/dL — AB (ref 215–435)
UIBC: 474 ug/dL — AB (ref 125–400)

## 2013-10-16 LAB — PREPARE RBC (CROSSMATCH)

## 2013-10-16 LAB — HEMOGLOBIN AND HEMATOCRIT, BLOOD
HEMATOCRIT: 21.6 % — AB (ref 39.0–52.0)
HEMOGLOBIN: 6.2 g/dL — AB (ref 13.0–17.0)

## 2013-10-16 LAB — HAPTOGLOBIN: Haptoglobin: 162 mg/dL (ref 45–215)

## 2013-10-16 LAB — FERRITIN: Ferritin: 3 ng/mL — ABNORMAL LOW (ref 22–322)

## 2013-10-16 MED ORDER — PEG-KCL-NACL-NASULF-NA ASC-C 100 G PO SOLR
0.5000 | Freq: Once | ORAL | Status: AC
  Administered 2013-10-17: 100 g via ORAL
  Filled 2013-10-16: qty 1

## 2013-10-16 MED ORDER — PEG-KCL-NACL-NASULF-NA ASC-C 100 G PO SOLR: 1.0000 | Freq: Once | ORAL | Status: DC

## 2013-10-16 MED ORDER — PEG-KCL-NACL-NASULF-NA ASC-C 100 G PO SOLR
0.5000 | Freq: Once | ORAL | Status: AC
  Administered 2013-10-16: 100 g via ORAL
  Filled 2013-10-16: qty 1

## 2013-10-16 MED ORDER — SODIUM CHLORIDE 0.9 % IV SOLN
INTRAVENOUS | Status: DC
  Administered 2013-10-16: 23:00:00 via INTRAVENOUS

## 2013-10-16 MED ORDER — FUROSEMIDE 10 MG/ML IJ SOLN
20.0000 mg | Freq: Once | INTRAMUSCULAR | Status: AC
  Administered 2013-10-16: 20 mg via INTRAVENOUS
  Filled 2013-10-16: qty 2

## 2013-10-16 MED ORDER — SODIUM CHLORIDE 0.9 % IV SOLN: INTRAVENOUS | Status: DC

## 2013-10-16 MED ORDER — PNEUMOCOCCAL VAC POLYVALENT 25 MCG/0.5ML IJ INJ
0.5000 mL | INJECTION | Freq: Once | INTRAMUSCULAR | Status: AC
Start: 2013-10-16 — End: 2013-10-16
  Administered 2013-10-16: 0.5 mL via INTRAMUSCULAR
  Filled 2013-10-16: qty 0.5

## 2013-10-16 NOTE — Progress Notes (Signed)
MD clarification okay to not place order for CBC with differential post transfusion and to wait for results from scheduled pm lab draw.

## 2013-10-16 NOTE — Consult Note (Addendum)
[facility] Consult: 10:27 AM 10/16/2013  LOS: 1 day    Referring Provider: Georgiann Mohs  Primary Care Physician:  [name], MD Primary Gastroenterologist:  None    Reason for Consultation:  Anemia   HPI: Jason Russell is a 63 y.o. male with a hx of HTN, A-fib, ICD placement, sleep apnea, and tobacco abuse.  Pt presented to his cardiologist on 10/15/13 with c/o worsening DOE x 2 weeks. Pt states it had progressed to minimal activity.  Upon receiving blood work at his Cardiologist, it was noted pt had significant microcytic anemia (Hemoglobin 5.1, MCV 64.4) and was directly admitted to [facility].  He denies any chest pain currently and states his SOB is only when he is active.  He denies fever, chills, and night sweats. He endorses a weight loss of 31 lbs since last March, which he has achieved through diet. He denies any recent illness or antibiotic use.  He denies N/V, Diarrhea, or Constipation.  Last BM x2 days and was normal.  He denies ever seeing blood in his stool or having black stools.  Denies change in stool caliber. Denies any urinary changes. He states he does not have a Copywriter, advertising and has never had an EGD or colonoscopy.  He denies hx of PUD but does have occasional belching after some meals, that is worse when lying down and from which he experiences some relief with tums. He is currently on Xarelto for his Afib, but denies any bleeding problems or personal/family history of bleeding disorders.  Denies hx of anemia or blood transfusion prior to this hospital visit.      Past Medical History  Diagnosis Date  . Hypertension   . Dyslipidemia   . Obesity   . Atrial fibrillation     Amiodarone started 10/2011; Coumadin  . Tobacco abuse   . COPD (chronic obstructive pulmonary disease)     . NICM (nonischemic cardiomyopathy)     LHC 4/14:  minimal CAD  . Chronic systolic heart failure     a. Echo 7/13: EF 25%;  b. echo 04/2012:  Mild LVH, EF 30-35%, Gr 1 DD, mild AI, mild MR, mild LAE  . 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 St. Jude ICD implantation 10/03/12.  . Chronic anticoagulation   . Asthma     "when I was a child"  . OSA on CPAP   . Anemia   . History of blood transfusion 10/15/2013    "don't know where the blood's going; HgB down to 5"  . Arthritis     "hx right hip"  . Gout     Past Surgical History  Procedure Laterality Date  . Total hip arthroplasty Right 11/25/1997  . Cardioversion  2011  . Tonsillectomy  1950's  . Cardiac defibrillator placement  2014  . Joint replacement      Prior to Admission medications   Medication Sig Start Date End Date Taking? Authorizing Provider  allopurinol (ZYLOPRIM) 300 MG tablet Take 300 mg by mouth 2 (two) times  daily.  12/18/10  Yes Historical Provider, MD  amiodarone (PACERONE) 200 MG tablet TAKE 1 TABLET BY MOUTH DAILY.   Yes Fay Records, MD  amLODipine (NORVASC) 5 MG tablet TAKE 1 TABLET BY MOUTH EVERY DAY   Yes Fay Records, MD  atorvastatin (LIPITOR) 20 MG tablet TAKE 1 TABLET BY MOUTH EVERY DAY 08/04/13  Yes Fay Records, MD  buPROPion [facility] SR) 150 MG 12 hr tablet Take 1 tablet (150 mg total) by mouth 2 (two) times daily. 08/18/13  Yes Fay Records, MD  carvedilol (COREG) 25 MG tablet Take 1 tablet (25 mg total) by mouth 2 (two) times daily. 06/17/12  Yes Fay Records, MD  hydrALAZINE (APRESOLINE) 50 MG tablet TAKE 1 TABLET (50 MG TOTAL) BY MOUTH 3 (THREE) TIMES DAILY. 09/15/13  Yes Fay Records, MD  KLOR-CON M20 20 MEQ tablet TAKE 1 TABLET BY MOUTH EVERY DAY 07/01/13  Yes Fay Records, MD  lisinopril (PRINIVIL,ZESTRIL) 40 MG tablet TAKE 1 TABLET BY MOUTH EVERY DAY 09/30/13  Yes Fay Records, MD  niacin (NIASPAN) 1000 MG CR tablet TAKE 1 TABLET BY MOUTH AT BEDTIME   Yes Fay Records, MD  Rivaroxaban (XARELTO) 20 MG TABS tablet Take 1 tablet (20 mg total) by mouth daily. 01/31/13  Yes Fay Records, MD  spironolactone (ALDACTONE) 25 MG tablet Take 12.5 mg by mouth daily.   Yes Historical Provider, MD  VIAGRA 100 MG tablet Take 100 mg by mouth daily as needed for erectile dysfunction. As directed 10/28/12  Yes Historical Provider, MD    Scheduled Meds: . allopurinol  300 mg Oral BID  . amiodarone  200 mg Oral Daily  . amLODipine  5 mg Oral Daily  . atorvastatin  20 mg Oral q1800  . buPROPion  150 mg Oral BID  . carvedilol  25 mg Oral BID  . hydrALAZINE  50 mg Oral 3 times per day  . pantoprazole  40 mg Oral BID AC  . sodium chloride  3 mL Intravenous Q12H  . spironolactone  12.5 mg Oral Daily   Infusions:   PRN Meds: acetaminophen, acetaminophen, ondansetron (ZOFRAN) IV, ondansetron   Allergies as of 10/15/2013  . (No Known Allergies)    Family History  Problem Relation Age of Onset  . Other Father     deceased- unknow reason  . Hypertension Mother   . Lung cancer Brother     History   Social History  . Marital Status: Married    Spouse Name: N/A    Number of Children: 1  . Years of Education: N/A   Occupational History  . [employer] Operator    Social History Main Topics  . Smoking status: Current Every Day Smoker -- 0.25 packs/day for 45 years    Types: Cigarettes  . Smokeless tobacco: Never Used  . Alcohol Use: 0.6 oz/week    1 Cans of beer per week  . Drug Use: No  . Sexual Activity: Not Currently   Other Topics Concern  . Not on file   Social History Narrative  . No narrative on file    REVIEW OF SYSTEMS: Constitutional:  + Weight loss with diet.  Fatigue on exertion.  Denies night sweats. ENT:  No nose bleeds CV:  No palpitations, no LE edema.  GU:  No hematuria, no frequency Transfusions:  Received blood overnight, but had previously not had a transfusion. Neuro:  No headaches, no peripheral tingling or  numbness Derm:  No itching, no rash or sores.  Endocrine:  No sweats or chills.  No polyuria or dysuria Immunization:  Received pneumovax recently.  Declines flu shots. Travel:  Denies recent long distance travel.    PHYSICAL EXAM: Vital signs in last 24 hours: Filed Vitals:   10/16/13 1019  BP: 108/66  Pulse: 74  Temp: 98.5 F (36.9 C)  Resp: 16   Wt Readings from Last 3 Encounters:  10/16/13 295 lb 4.9 oz (133.95 kg)  10/15/13 290 lb 12.8 oz (131.906 kg)  06/27/13 309 lb (140.161 kg)    General: Obese AA male in NAD HENT: Normocephalic, atraumatic.  Without mass or lesion.  Negative scleral icterus.  Mucous membrane pink and moist Lungs:  CTA bilaterally. Normal effort and depth Heart: Paced rhythm.  No M/G/R noted Abdomen:  Soft, non-distended, and non tender. No H/S/M noted.  + BS   Rectal: FOBT negative.   Musc/Skeltl: No gross abnormalities Extremities:  Negative peripheral edema.  2+ DP and Radial pulses  Neurologic:  A&O to person, place, and time.   Skin: Without rash or lesion. Tattoos:  None Psych:  Normal mood and affect  Intake/Output from previous day: 07/15 0701 - 07/16 0700 In: 1126.5 [P.O.:444; Blood:682.5] Out: 400 [Urine:400] Intake/Output this shift: Total I/O In: 0  Out: 1150 [Urine:1150]  LAB RESULTS:  Recent Labs  10/15/13 1511 10/15/13 1715 10/16/13 0039 10/16/13 0649  WBC 9.0 7.2 7.2  --   HGB 5.2 Repeated and verified X2.* 5.1* 5.4* 6.2*  HCT 18.3 Repeated and verified X2.* 19.5* 19.8* 21.6*  PLT 303.0 270 228  --    BMET Lab Results  Component Value Date   NA 141 10/15/2013   NA 139 10/15/2013   NA 139 03/03/2013   K 4.2 10/15/2013   K 3.7 10/15/2013   K 3.5 03/03/2013   CL 103 10/15/2013   CL 107 10/15/2013   CL 104 03/03/2013   CO2 21 10/15/2013   CO2 24 10/15/2013   CO2 26 03/03/2013   GLUCOSE 108* 10/15/2013   GLUCOSE 101* 10/15/2013   GLUCOSE 95 03/03/2013   BUN 23 10/15/2013   BUN 21 10/15/2013   BUN 19 03/03/2013    CREATININE 1.86* 10/15/2013   CREATININE 1.6* 10/15/2013   CREATININE 1.6* 03/03/2013   CALCIUM 9.3 10/15/2013   CALCIUM 9.2 10/15/2013   CALCIUM 9.0 03/03/2013   LFT  Recent Labs  10/15/13 1931  PROT 7.2  ALBUMIN 3.7  AST 22  ALT 18  ALKPHOS 119*  BILITOT 0.3  BILIDIR <0.2  IBILI NOT CALCULATED   PT/INR Lab Results  Component Value Date   INR 2.82* 10/15/2013   INR 3.1 01/31/2013   INR 2.5 12/27/2012    RADIOLOGY STUDIES: Dg Chest 2 View  10/15/2013   CLINICAL DATA:  Shortness of breath  EXAM: CHEST  2 VIEW  COMPARISON:  October 04, 2012  FINDINGS: The heart size and mediastinal contours are stable. Cardiac pacemaker is unchanged. There is no focal infiltrate, pulmonary edema, or pleural effusion. The visualized skeletal structures are stable.  IMPRESSION: No active cardiopulmonary disease.   Electronically Signed   By: Abelardo Diesel M.D.   On: 10/15/2013 15:58    ENDOSCOPIC STUDIES: Denies history of either EGD or colonoscopy  IMPRESSION:   - Anemia of unknown source - Indeterminate if GI in nature, but less likely due to lack of symptoms.  Seems to be correcting with blood transfusion, but pt has significant Iron deficiency  which is likely a strong contributing factor if not primary source.     PLAN:     - Schedule for Endoscopy and most likely colonoscopy as well. With lack of any acute GI symptoms, may be appropriate to do in outpatient setting once Hemoglobin is stabilized.   [place], [place]  10/16/2013, 10:27 AM Pager: [phone]  GI Attending Note   Chart was reviewed and patient was examined. X-rays and lab were reviewed.   Patient has a symptomatic iron deficiency anemia, most likely due to chronic GI blood loss.  Malabsorption and and  malnutrition are less likely.  He needs endoscopic studies to rule out neoplasm, AVMs and active peptic disease.  Plan colonoscopy and possible upper endoscopy in a.m.  the patient's last dose of xarelto was 2 days ago so I  believe it is safe to proceed with the procedures tomorrow.  Sandy Salaam. Deatra Ina, M.D., [facility] Cell [phone]

## 2013-10-16 NOTE — Progress Notes (Signed)
Paitent stated he did not want to wear CPAP tonight.  Patient is aware to have RN call RT if he changes his mind.

## 2013-10-16 NOTE — Progress Notes (Signed)
Patient admitted to unit via stretcher to [phone]. Pt alert and oriented x 4, in no apparent distress. Skin intact. IV intact, blood currently transfusing. Pt w/o any complaints. Safety video viewed with patient. Call bell placed within reach, pt understands how to get in contact with nursing staff. Will continue to monitor patient per MD orders.

## 2013-10-16 NOTE — Progress Notes (Signed)
Utilization review completed.  

## 2013-10-16 NOTE — Progress Notes (Addendum)
CRITICAL VALUE ALERT  Critical value received:  Hgb 5.4  Date of notification:  10/16/2013  Time of notification:  0108  Critical value read back:Yes.    Nurse who received alert:  Oliwia Berzins, RN  MD notified (1st page):  Posey Pronto, MD  Time of first page:  0110  MD notified (2nd page):  Time of second page:  Responding MD: Posey Pronto, MD  Time MD responded:  33  MD stated he would order CT abdomen/pelvis. Notified that 2nd unit RBC currently transfusing.

## 2013-10-16 NOTE — Care Management Note (Signed)
    Page 1 of 1   10/16/2013     4:50:43 PM CARE MANAGEMENT NOTE 10/16/2013  Patient:  Jason Russell, Jason Russell   Account Number:  [id number]  Date Initiated:  10/16/2013  Documentation initiated by:  Tomi Bamberger  Subjective/Objective Assessment:   dx symptomatic anemia, ? gib  admit- lives with spouse.     Action/Plan:   Anticipated DC Date:  10/18/2013   Anticipated DC Plan:  [place]  CM consult      Choice offered to / List presented to:             Status of service:  In process, will continue to follow Medicare Important Message given?   (If response is "NO", the following Medicare IM given date fields will be blank) Date Medicare IM given:   Medicare IM given by:   Date Additional Medicare IM given:   Additional Medicare IM given by:    Discharge Disposition:    Per UR Regulation:  Reviewed for med. necessity/level of care/duration of stay  If discussed at [place] of Stay Meetings, dates discussed:    Comments:

## 2013-10-16 NOTE — Progress Notes (Signed)
[facility] PROGRESS NOTE  Jason Russell RCV:[id number] DOB: 12/24/1950 DOA: 10/15/2013 PCP: [name], MD  Assessment/Plan: 1. Symptomatic anemia- Patient is on Xarelto which was started 8 months ago, his last Hb from Dec 2014 was 11.3. He denies black colored stool or blood in the stool. FOBT was negative in the ED. Has been transfused two units PRBC, hb is now 6.2, will give one more unit of PRBC. Check H&H q 8 hs. 2. ? GI Bleed- With severe iron deficiency, GI bleed is strong possibility , especially after he was started on Xarelto 8 months ago. Will get the GI consult for possible EGD. 3. Atrial fibrillation- HR controlled, continue with amiodarone. Xarelto is on hold. 4. Nonischemic cardiomyopathy- Has EF of 20-30%, compensated at this time, will give one dose of lasix 20 mg x1 will monitor the BUN/Cr.  Code Status:Full code Family Communication: *No family at bedside Disposition Plan: Home when stable   Consultants:  GI  Procedures:  None  Antibiotics:  *None  HPI/Subjective: Patient seen and examined, admitted with exertional shortness of breath, found to have profound anemia.Labs show iron deficiency, with iron level 10.  Objective: Filed Vitals:   10/16/13 1019  BP: 108/66  Pulse: 74  Temp: 98.5 F (36.9 C)  Resp: 16    Intake/Output Summary (Last 24 hours) at 10/16/13 1031 Last data filed at 10/16/13 1022  Gross per 24 hour  Intake 1126.5 ml  Output   1550 ml  Net -423.5 ml   Filed Weights   10/15/13 1702 10/15/13 2044 10/16/13 0500  Weight: 131.543 kg (290 lb) 133.947 kg (295 lb 4.8 oz) 133.95 kg (295 lb 4.9 oz)    Exam:   General:  Appear in no acute distress  Cardiovascular: s1s2 RRR  Respiratory: Clear bilaterally  Abdomen: Soft nontender  Musculoskeletal: No edema  Data Reviewed: Basic Metabolic Panel:  Recent Labs Lab 10/15/13 1511 10/15/13 1715  NA 139 141  K 3.7 4.2  CL 107 103  CO2 24 21  GLUCOSE 101*  108*  BUN 21 23  CREATININE 1.6* 1.86*  CALCIUM 9.2 9.3   Liver Function Tests:  Recent Labs Lab 10/15/13 1931  AST 22  ALT 18  ALKPHOS 119*  BILITOT 0.3  PROT 7.2  ALBUMIN 3.7   No results found for this basename: LIPASE, AMYLASE,  in the last 168 hours No results found for this basename: AMMONIA,  in the last 168 hours CBC:  Recent Labs Lab 10/15/13 1511 10/15/13 1715 10/16/13 0039 10/16/13 0649  WBC 9.0 7.2 7.2  --   NEUTROABS  --   --  4.5  --   HGB 5.2 Repeated and verified X2.* 5.1* 5.4* 6.2*  HCT 18.3 Repeated and verified X2.* 19.5* 19.8* 21.6*  MCV 60.4* 64.4* 64.7*  --   PLT 303.0 270 228  --    Cardiac Enzymes: No results found for this basename: CKTOTAL, CKMB, CKMBINDEX, TROPONINI,  in the last 168 hours BNP (last 3 results)  Recent Labs  10/15/13 1511  PROBNP 29.0   CBG: No results found for this basename: GLUCAP,  in the last 168 hours  No results found for this or any previous visit (from the past 240 hour(s)).   Studies: Dg Chest 2 View  10/15/2013   CLINICAL DATA:  Shortness of breath  EXAM: CHEST  2 VIEW  COMPARISON:  October 04, 2012  FINDINGS: The heart size and mediastinal contours are stable. Cardiac pacemaker is unchanged. There is no  focal infiltrate, pulmonary edema, or pleural effusion. The visualized skeletal structures are stable.  IMPRESSION: No active cardiopulmonary disease.   Electronically Signed   By: Abelardo Diesel M.D.   On: 10/15/2013 15:58    Scheduled Meds: . allopurinol  300 mg Oral BID  . amiodarone  200 mg Oral Daily  . amLODipine  5 mg Oral Daily  . atorvastatin  20 mg Oral q1800  . buPROPion  150 mg Oral BID  . carvedilol  25 mg Oral BID  . hydrALAZINE  50 mg Oral 3 times per day  . pantoprazole  40 mg Oral BID AC  . sodium chloride  3 mL Intravenous Q12H  . spironolactone  12.5 mg Oral Daily   Continuous Infusions:   Principal Problem:   Symptomatic anemia Active Problems:   OVERWEIGHT/OBESITY   TOBACCO  ABUSE   OBSTRUCTIVE SLEEP APNEA   HYPERTENSION, BENIGN   Chronic anticoagulation   Chronic systolic heart failure   Nonischemic cardiomyopathy    Time spent: 25 min    [facility] Pager [phone]. If 7PM-7AM, please contact night-coverage at [email or web address], password [facility] 10/16/2013, 10:31 AM  LOS: 1 day

## 2013-10-17 ENCOUNTER — Encounter (HOSPITAL_COMMUNITY)
Admission: EM | Disposition: A | Discharge status: Home / Self Care | Payer: Self-pay | Source: Home / Self Care | Attending: Family Medicine

## 2013-10-17 ENCOUNTER — Encounter (HOSPITAL_COMMUNITY): Payer: Self-pay | Admitting: *Deleted

## 2013-10-17 ENCOUNTER — Encounter (HOSPITAL_COMMUNITY): Payer: No Typology Code available for payment source | Admitting: Certified Registered Nurse Anesthetist

## 2013-10-17 ENCOUNTER — Inpatient Hospital Stay (HOSPITAL_COMMUNITY): Payer: No Typology Code available for payment source | Admitting: Certified Registered Nurse Anesthetist

## 2013-10-17 DIAGNOSIS — K648 Other hemorrhoids: Secondary | ICD-10-CM

## 2013-10-17 HISTORY — PX: ESOPHAGOGASTRODUODENOSCOPY: SHX5428

## 2013-10-17 HISTORY — PX: COLONOSCOPY WITH PROPOFOL: SHX5780

## 2013-10-17 LAB — MDC_IDC_ENUM_SESS_TYPE_INCLINIC
Battery Remaining Longevity: 93.6 mo
Brady Statistic RV Percent Paced: 0 %
Date Time Interrogation Session: 20150715184003
HighPow Impedance: 29.1059
Lead Channel Impedance Value: 337.5 Ohm
Lead Channel Pacing Threshold Amplitude: 0.75 V
Lead Channel Pacing Threshold Pulse Width: 0.5 ms
Lead Channel Setting Pacing Pulse Width: 0.5 ms
MDC IDC MSMT LEADCHNL RV PACING THRESHOLD AMPLITUDE: 0.75 V
MDC IDC MSMT LEADCHNL RV PACING THRESHOLD PULSEWIDTH: 0.5 ms
MDC IDC MSMT LEADCHNL RV SENSING INTR AMPL: 11.7 mV
MDC IDC PG SERIAL: 1105031
MDC IDC SET LEADCHNL RV PACING AMPLITUDE: 2.5 V
MDC IDC SET LEADCHNL RV SENSING SENSITIVITY: 0.5 mV
Zone Setting Detection Interval: 250 ms
Zone Setting Detection Interval: 300 ms

## 2013-10-17 LAB — BASIC METABOLIC PANEL
ANION GAP: 13 (ref 5–15)
BUN: 17 mg/dL (ref 6–23)
CO2: 25 mEq/L (ref 19–32)
Calcium: 8.9 mg/dL (ref 8.4–10.5)
Chloride: 105 mEq/L (ref 96–112)
Creatinine, Ser: 1.39 mg/dL — ABNORMAL HIGH (ref 0.50–1.35)
GFR calc non Af Amer: 53 mL/min — ABNORMAL LOW (ref >90)
GFR, EST AFRICAN AMERICAN: 62 mL/min — AB (ref >90)
Glucose, Bld: 84 mg/dL (ref 70–99)
Potassium: 3.6 mEq/L — ABNORMAL LOW (ref 3.7–5.3)
SODIUM: 143 meq/L (ref 137–147)

## 2013-10-17 LAB — HEMOGLOBIN AND HEMATOCRIT, BLOOD
HCT: 24.7 % — ABNORMAL LOW (ref 39.0–52.0)
HCT: 27.1 % — ABNORMAL LOW (ref 39.0–52.0)
HEMATOCRIT: 25.3 % — AB (ref 39.0–52.0)
HEMOGLOBIN: 7.1 g/dL — AB (ref 13.0–17.0)
HEMOGLOBIN: 7.7 g/dL — AB (ref 13.0–17.0)
Hemoglobin: 7.3 g/dL — ABNORMAL LOW (ref 13.0–17.0)

## 2013-10-17 SURGERY — ESOPHAGOGASTRODUODENOSCOPY (EGD) WITH PROPOFOL
Anesthesia: Monitor Anesthesia Care | Laterality: Left

## 2013-10-17 SURGERY — COLONOSCOPY WITH PROPOFOL
Anesthesia: Monitor Anesthesia Care

## 2013-10-17 MED ORDER — BUTAMBEN-TETRACAINE-BENZOCAINE 2-2-14 % EX AERO
INHALATION_SPRAY | CUTANEOUS | Status: DC | PRN
  Administered 2013-10-17: 2 via TOPICAL

## 2013-10-17 MED ORDER — PROPOFOL INFUSION 10 MG/ML OPTIME
INTRAVENOUS | Status: DC | PRN
  Administered 2013-10-17: 75 ug/kg/min via INTRAVENOUS

## 2013-10-17 MED ORDER — LACTATED RINGERS IV SOLN
INTRAVENOUS | Status: DC | PRN
  Administered 2013-10-17: 12:00:00 via INTRAVENOUS

## 2013-10-17 MED ORDER — RIVAROXABAN 20 MG PO TABS
20.0000 mg | ORAL_TABLET | Freq: Every day | ORAL | Status: DC
  Administered 2013-10-17: 20 mg via ORAL
  Filled 2013-10-17 (×3): qty 1

## 2013-10-17 MED ORDER — PROPOFOL 10 MG/ML IV BOLUS
INTRAVENOUS | Status: DC | PRN
  Administered 2013-10-17: 20 mg via INTRAVENOUS
  Administered 2013-10-17: 10 mg via INTRAVENOUS
  Administered 2013-10-17: 40 mg via INTRAVENOUS

## 2013-10-17 NOTE — Progress Notes (Signed)
ICD check in clinic. Normal device function. Threshold and sensing consistent with previous device measurements. Impedance trends stable over time. No evidence of any ventricular arrhythmias. Histogram distribution appropriate for patient and level of activity. Corvue enabled. Output decreased to chronic setting. Device programmed to optimize intrinsic conduction. Estimated longevity 7.6 years. Pt enrolled in remote follow-up. Plan to follow up with [facility] in 3 months. Patient education completed including shock plan. Vibration demonstrated for patient.

## 2013-10-17 NOTE — Progress Notes (Signed)
Paitent stated he did not want to wear CPAP tonight. Patient is aware to have RN call RT if he changes his mind.

## 2013-10-17 NOTE — Op Note (Signed)
[facility] [place], [id number]   COLONOSCOPY PROCEDURE REPORT  PATIENT: Jason Russell, Jason Russell  MR#: [id number] BIRTHDATE: [birth year] , 62  yrs. old GENDER: Male ENDOSCOPIST: Inda Castle, MD REFERRED BY: PROCEDURE DATE:  10/17/2013 PROCEDURE:   Colonoscopy, diagnostic ASA CLASS:   Class III INDICATIONS:Iron Deficiency Anemia. MEDICATIONS: MAC sedation, administered by CRNA  DESCRIPTION OF PROCEDURE:   After the risks benefits and alternatives of the procedure were thoroughly explained, informed consent was obtained.  A digital rectal exam revealed no abnormalities of the rectum.   The     endoscope was introduced through the anus and advanced to the cecum, which was identified by both the appendix and ileocecal valve. No adverse events experienced.   The quality of the prep was good, using MiraLax  The instrument was then slowly withdrawn as the colon was fully examined.      COLON FINDINGS: A normal appearing cecum, ileocecal valve, and appendiceal orifice were identified.  The ascending, hepatic flexure, transverse, splenic flexure, descending, sigmoid colon and rectum appeared unremarkable.  No polyps or cancers were seen. Retroflexed views revealed internal hemorrhoids. The time to cecum= .  Withdrawal time=17 minutes 0 seconds.  The scope was withdrawn and the procedure completed. COMPLICATIONS: There were no complications.  ENDOSCOPIC IMPRESSION: Normal colon (hemorrhoids)  RECOMMENDATIONS: EGD  eSigned:  Inda Castle, MD 10/17/2013 1:33 PM   cc:

## 2013-10-17 NOTE — Anesthesia Preprocedure Evaluation (Addendum)
Anesthesia Evaluation  Patient identified by MRN, date of birth, ID band Patient awake    Reviewed: Allergy & Precautions, H&P , NPO status   Airway       Dental   Pulmonary shortness of breath, asthma , sleep apnea , COPDCurrent Smoker,  breath sounds clear to auscultation        Cardiovascular hypertension, + Cardiac Defibrillator  10/15/13 ECG- NSR 04/2012 Echo- EF 30-35% nml valves, trivial regurgitation, mildly dilated L atrium, atrial-septal aneurysm   Neuro/Psych    GI/Hepatic   Endo/Other    Renal/GU      Musculoskeletal   Abdominal   Peds  Hematology  (+) Blood dyscrasia, anemia , Hgb 7.1   Anesthesia Other Findings   Reproductive/Obstetrics                          Anesthesia Physical Anesthesia Plan  ASA: IV  Anesthesia Plan: MAC   Post-op Pain Management:    Induction: Intravenous  Airway Management Planned: Nasal Cannula  Additional Equipment:   Intra-op Plan:   Post-operative Plan:   Informed Consent: I have reviewed the patients History and Physical, chart, labs and discussed the procedure including the risks, benefits and alternatives for the proposed anesthesia with the patient or authorized representative who has indicated his/her understanding and acceptance.   Dental advisory given  Plan Discussed with: CRNA and Surgeon  Anesthesia Plan Comments:        Anesthesia Quick Evaluation

## 2013-10-17 NOTE — Anesthesia Procedure Notes (Signed)
Procedure Name: MAC Date/Time: 10/17/2013 12:20 PM Performed by: Trixie Deis A Pre-anesthesia Checklist: Patient identified, Patient being monitored, Emergency Drugs available, Timeout performed and Suction available Patient Re-evaluated:Patient Re-evaluated prior to inductionOxygen Delivery Method: Nasal cannula Placement Confirmation: positive ETCO2

## 2013-10-17 NOTE — Op Note (Signed)
[facility] [place], [address]   ENDOSCOPY PROCEDURE REPORT  PATIENT: Jason Russell, Jason Russell  MR#: [id number] BIRTHDATE: 06-27-1950 , 62  yrs. old GENDER: Male ENDOSCOPIST: Inda Castle, MD REFERRED BY:  Eligah East, M.D. PROCEDURE DATE:  10/17/2013 PROCEDURE:  EGD w/ biopsy ASA CLASS:     Class III INDICATIONS:  Iron deficiency anemia. MEDICATIONS: There was residual sedation effect present from prior procedure and MAC sedation, administered by CRNA TOPICAL ANESTHETIC:  DESCRIPTION OF PROCEDURE: After the risks benefits and alternatives of the procedure were thoroughly explained, informed consent was obtained.  The Pentax Gastroscope [id number] endoscope was introduced through the mouth and advanced to the third portion of the duodenum. Without limitations.  The instrument was slowly withdrawn as the mucosa was fully examined.      The upper, middle and distal third of the esophagus were carefully inspected and no abnormalities were noted.  The z-line was well seen at the GEJ.  The endoscope was pushed into the fundus which was normal including a retroflexed view.  The antrum, gastric body, first and second part of the duodenum were unremarkable.   3cm sliding hiatal hernia was present.  Retroflexed views revealed . multiple biopsies were taken throughout the duodenum to rule out celiac disease   The scope was then withdrawn from the patient and the procedure completed.  COMPLICATIONS: There were no complications. ENDOSCOPIC IMPRESSION: Normal EGD (hiatal hernia)  RECOMMENDATIONS: Await biopsy results; if negative proceed with outpt capsule study resume xarelto REPEAT EXAM:  eSigned:  Inda Castle, MD 10/17/2013 1:41 PM   CC:

## 2013-10-17 NOTE — Progress Notes (Signed)
[facility] PROGRESS NOTE  Jason Russell OBS:[id number] DOB: 21-Dec-1950 DOA: 10/15/2013 PCP: [name], MD  Assessment/Plan: 1. Symptomatic anemia- Patient is on Xarelto which was started 8 months ago, his last Hb from Dec 2014 was 11.3. He denies black colored stool or blood in the stool. FOBT was negative in the ED. Has been transfused three units PRBC, hb is now 7.1 2. ? GI Bleed- With severe iron deficiency, GI bleed is strong possibility , especially after he was started on Xarelto 8 months ago. GI plans for EGD and colon today. 3. Atrial fibrillation- HR controlled, continue with amiodarone. Xarelto is on hold. 4. Nonischemic cardiomyopathy- Has EF of 20-30%, s/p defibrillator, appears well compensated.  Code Status:Full code Family Communication: *No family at bedside Disposition Plan: Home when stable   Consultants:  GI  Procedures:  None  Antibiotics:  *None  HPI/Subjective: Patient seen and examined, admitted with exertional shortness of breath, found to have profound anemia.Labs show iron deficiency, with iron level 10. Patient seen by GI, and plan for EGD and colonoscopy today.  Objective: Filed Vitals:   10/17/13 1204  BP: 142/84  Pulse: 66  Temp: 98.1 F (36.7 C)  Resp: 18    Intake/Output Summary (Last 24 hours) at 10/17/13 1324 Last data filed at 10/17/13 0934  Gross per 24 hour  Intake   1639 ml  Output    250 ml  Net   1389 ml   Filed Weights   10/16/13 0500 10/17/13 0500 10/17/13 0533  Weight: 133.95 kg (295 lb 4.9 oz) 126.644 kg (279 lb 3.2 oz) 132.586 kg (292 lb 4.8 oz)    Exam:   General:  Appear in no acute distress  Cardiovascular: s1s2 RRR  Respiratory: Clear bilaterally  Abdomen: Soft nontender  Musculoskeletal: No edema  Data Reviewed: Basic Metabolic Panel:  Recent Labs Lab 10/15/13 1511 10/15/13 1715 10/16/13 1016 10/17/13 0430  NA 139 141 139 143  K 3.7 4.2 4.1 3.6*  CL 107 103 101 105  CO2  24 21 22 25   GLUCOSE 101* 108* 100* 84  BUN 21 23 22 17   CREATININE 1.6* 1.86* 1.51* 1.39*  CALCIUM 9.2 9.3 9.3 8.9   Liver Function Tests:  Recent Labs Lab 10/15/13 1931 10/16/13 1016  AST 22 22  ALT 18 18  ALKPHOS 119* 125*  BILITOT 0.3 0.4  PROT 7.2 7.0  ALBUMIN 3.7 3.6   No results found for this basename: LIPASE, AMYLASE,  in the last 168 hours No results found for this basename: AMMONIA,  in the last 168 hours CBC:  Recent Labs Lab 10/15/13 1511 10/15/13 1715 10/16/13 0039 10/16/13 0649 10/16/13 1016 10/16/13 2010 10/17/13 0430 10/17/13 1040  WBC 9.0 7.2 7.2  --  6.6 7.7  --   --   NEUTROABS  --   --  4.5  --  4.4 5.5  --   --   HGB 5.2 Repeated and verified X2.* 5.1* 5.4* 6.2* 6.7* 7.5* 7.3* 7.1*  HCT 18.3 Repeated and verified X2.* 19.5* 19.8* 21.6* 23.9* 26.1* 25.3* 24.7*  MCV 60.4* 64.4* 64.7*  --  65.8* 68.1*  --   --   PLT 303.0 270 228  --  246 235  --   --    Cardiac Enzymes: No results found for this basename: CKTOTAL, CKMB, CKMBINDEX, TROPONINI,  in the last 168 hours BNP (last 3 results)  Recent Labs  10/15/13 1511  PROBNP 29.0   CBG: No results found for this  basename: GLUCAP,  in the last 168 hours  No results found for this or any previous visit (from the past 240 hour(s)).   Studies: Dg Chest 2 View  10/15/2013   CLINICAL DATA:  Shortness of breath  EXAM: CHEST  2 VIEW  COMPARISON:  October 04, 2012  FINDINGS: The heart size and mediastinal contours are stable. Cardiac pacemaker is unchanged. There is no focal infiltrate, pulmonary edema, or pleural effusion. The visualized skeletal structures are stable.  IMPRESSION: No active cardiopulmonary disease.   Electronically Signed   By: Abelardo Diesel M.D.   On: 10/15/2013 15:58    Scheduled Meds: . allopurinol  300 mg Oral BID  . amiodarone  200 mg Oral Daily  . amLODipine  5 mg Oral Daily  . atorvastatin  20 mg Oral q1800  . buPROPion  150 mg Oral BID  . carvedilol  25 mg Oral BID  .  hydrALAZINE  50 mg Oral 3 times per day  . pantoprazole  40 mg Oral BID AC  . sodium chloride  3 mL Intravenous Q12H  . spironolactone  12.5 mg Oral Daily   Continuous Infusions: . sodium chloride 20 mL/hr at 10/16/13 2321  . sodium chloride      Principal Problem:   Symptomatic anemia Active Problems:   OVERWEIGHT/OBESITY   TOBACCO ABUSE   OBSTRUCTIVE SLEEP APNEA   HYPERTENSION, BENIGN   Chronic anticoagulation   Chronic systolic heart failure   Nonischemic cardiomyopathy   Iron deficiency anemia, unspecified    Time spent: 25 min    [facility] Pager [phone]. If 7PM-7AM, please contact night-coverage at [email or web address], password [facility] 10/17/2013, 1:24 PM  LOS: 2 days

## 2013-10-17 NOTE — Transfer of Care (Signed)
Immediate Anesthesia Transfer of Care Note  Patient: Jason Russell  Procedure(s) Performed: Procedure(s): COLONOSCOPY WITH PROPOFOL (Left) ESOPHAGOGASTRODUODENOSCOPY (EGD) (N/A)  Patient Location: Endoscopy Unit  Anesthesia Type:MAC  Level of Consciousness: awake, alert  and oriented  Airway & Oxygen Therapy: Patient Spontanous Breathing and Patient connected to nasal cannula oxygen  Post-op Assessment: Report given to PACU RN, Post -op Vital signs reviewed and stable and Patient moving all extremities  Post vital signs: Reviewed and stable  Complications: No apparent anesthesia complications

## 2013-10-17 NOTE — Anesthesia Postprocedure Evaluation (Signed)
  Anesthesia Post-op Note  Patient: Jason Russell  Procedure(s) Performed: Procedure(s): COLONOSCOPY WITH PROPOFOL (Left) ESOPHAGOGASTRODUODENOSCOPY (EGD) (N/A)  Patient Location: PACU and Endoscopy Unit  Anesthesia Type:MAC  Level of Consciousness: awake and sedated  Airway and Oxygen Therapy: Patient Spontanous Breathing  Post-op Pain: none  Post-op Assessment: Post-op Vital signs reviewed  Post-op Vital Signs: stable  Last Vitals:  Filed Vitals:   10/17/13 1330  BP: 139/78  Pulse: 61  Temp:   Resp: 18    Complications: No apparent anesthesia complications

## 2013-10-17 NOTE — Progress Notes (Signed)
Doing well this am and completed prep without difficulties. Continues to deny GI symptoms (N/V, eructation, melena, hematochezia, hematemesis, ABD pain). Scheduled for Colonoscopy and EGD @ [place]

## 2013-10-17 NOTE — Progress Notes (Signed)
    Progress Note   Subjective  Doing well this morning.  Continues to deny N/V, diarrhea, or any other bowel or bladder symptoms.  Completed prep without difficulty. EGD and Colonoscopy performed without complication but were not diagnostic.       Objective   Vital signs in last 24 hours: Temp:  [97.8 F (36.6 C)-98.8 F (37.1 C)] 98.1 F (36.7 C) (07/17 1204) Pulse Rate:  [66-71] 66 (07/17 1204) Resp:  [18-20] 18 (07/17 1204) BP: (119-144)/(63-84) 142/84 mmHg (07/17 1204) SpO2:  [98 %-100 %] 98 % (07/17 1204) Weight:  [279 lb 3.2 oz (126.644 kg)-292 lb 4.8 oz (132.586 kg)] 292 lb 4.8 oz (132.586 kg) (07/17 0533) Last BM Date: 10/17/13 General:    AA male male in NAD Heart:  Regular rate and rhythm; no murmurs Lungs: Respirations even and unlabored, lungs CTA bilaterally Abdomen:  Soft, nontender and nondistended. Normal bowel sounds. Extremities:  Without edema. Neurologic:  Alert and oriented,  grossly normal neurologically. Psych:  Cooperative. Normal mood and affect.  Intake/Output from previous day: 07/16 0701 - 07/17 0700 In: 1851.5 [P.O.:1314; Blood:537.5] Out: 1400 [Urine:1400] Intake/Output this shift:    Lab Results:  Recent Labs  10/16/13 0039  10/16/13 1016 10/16/13 2010 10/17/13 0430 10/17/13 1040  WBC 7.2  --  6.6 7.7  --   --   HGB 5.4*  < > 6.7* 7.5* 7.3* 7.1*  HCT 19.8*  < > 23.9* 26.1* 25.3* 24.7*  PLT 228  --  246 235  --   --   < > = values in this interval not displayed. BMET  Recent Labs  10/15/13 1715 10/16/13 1016 10/17/13 0430  NA 141 139 143  K 4.2 4.1 3.6*  CL 103 101 105  CO2 21 22 25   GLUCOSE 108* 100* 84  BUN 23 22 17   CREATININE 1.86* 1.51* 1.39*  CALCIUM 9.3 9.3 8.9   LFT  Recent Labs  10/15/13 1931 10/16/13 1016  PROT 7.2 7.0  ALBUMIN 3.7 3.6  AST 22 22  ALT 18 18  ALKPHOS 119* 125*  BILITOT 0.3 0.4  BILIDIR <0.2  --   IBILI NOT CALCULATED  --    PT/INR  Recent Labs  10/15/13 1931  LABPROT 29.7*    INR 2.82*    Studies/Results: Dg Chest 2 View  10/15/2013   CLINICAL DATA:  Shortness of breath  EXAM: CHEST  2 VIEW  COMPARISON:  October 04, 2012  FINDINGS: The heart size and mediastinal contours are stable. Cardiac pacemaker is unchanged. There is no focal infiltrate, pulmonary edema, or pleural effusion. The visualized skeletal structures are stable.  IMPRESSION: No active cardiopulmonary disease.   Electronically Signed   By: Abelardo Diesel M.D.   On: 10/15/2013 15:58       Assessment / Plan:    - EGD and colonoscopy were non diagnostic.   Multiple biopsies taken to r/o celiac sprue.   If negative, may consider capsule study as outpatient. - May resume Xarelto.    LOS: 2 days   Jan, Ol[place]  10/17/2013, 1:18 PM

## 2013-10-18 DIAGNOSIS — K648 Other hemorrhoids: Secondary | ICD-10-CM

## 2013-10-18 LAB — HEMOGLOBIN AND HEMATOCRIT, BLOOD
HCT: 25 % — ABNORMAL LOW (ref 39.0–52.0)
HCT: 25.8 % — ABNORMAL LOW (ref 39.0–52.0)
HEMOGLOBIN: 7.4 g/dL — AB (ref 13.0–17.0)
Hemoglobin: 7.2 g/dL — ABNORMAL LOW (ref 13.0–17.0)

## 2013-10-18 MED ORDER — FERROUS GLUCONATE 324 (38 FE) MG PO TABS: 324.0000 mg | ORAL_TABLET | Freq: Three times a day (TID) | ORAL | Status: DC

## 2013-10-18 NOTE — Discharge Summary (Addendum)
Physician Discharge Summary  Jason Russell BMW:[id number] DOB: 27-May-1950 DOA: 10/15/2013  PCP: [name], MD  Admit date: 10/15/2013 Discharge date: 10/18/2013  Time spent: *50 minutes  Recommendations for Outpatient Follow-up:  1. *Follow up Dr Deatra Ina in one week  Discharge Diagnoses:  Principal Problem:   Symptomatic anemia Active Problems:   OVERWEIGHT/OBESITY   TOBACCO ABUSE   OBSTRUCTIVE SLEEP APNEA   HYPERTENSION, BENIGN   Chronic anticoagulation   Chronic systolic heart failure   Nonischemic cardiomyopathy   Iron deficiency anemia, unspecified   Internal hemorrhoids without mention of complication   Discharge Condition: Stable  Diet recommendation: Regular diet  Filed Weights   10/17/13 0500 10/17/13 0533 10/18/13 0500  Weight: 126.644 kg (279 lb 3.2 oz) 132.586 kg (292 lb 4.8 oz) 130.681 kg (288 lb 1.6 oz)    History of present illness:  63 y.o. male with Past medical history of hypertension, nonischemic cardiomyopathy, atrial fibrillation on amiodarone and Xarelto, ICD placement, sleep apnea on CPAP, active smoker.  Patient presented with complaints of shortness of breath that has been ongoing since last one week. Shortness of breath primarily occurs on exertion and is associated with sense of fatigue and tiredness. Since last 2 weeks he has been having progressively worsening fatigue. He denies any chest pain or heaviness orthopnea or PND. He denies any cough or fever or chills.  He mentions he is compliant with his medication and no recent change in his medications.  He denies any sick contact. He denies any travel history.  He denies seeing any blood anywhere he denies seeing any black color bowel movements or diarrhea. Denies any vomiting or nausea or burning urination. Denies any fall or trauma.  Denies being on any antibiotics recently.  He has not seen any PCP since last 2 years and has not had gone through any colonoscopy so far.  He denies any  prior history of anemia or blood transfusion. He is on rivaroxaban for 9 months at least and prior to that was on warfarin and did not have any bleeding.  The patient is coming from home. And at his baseline independent for most of his ADL.   Hospital Course:  *Symptomatic iron deficiency  anemia- Patient is on Xarelto which was started 8 months ago, his last Hb from Dec 2014 was 11.3. He denies black colored stool or blood in the stool. FOBT was negative in the ED. Has been transfused three units PRBC, hb is now 7.1. Will need CBC in three days. If heamoccult continues to be negative, and GI work up negative, will need hematology evaluation. Will send on Ferrous gluconate 324 mg po TID.   ? GI Bleed- With severe iron deficiency, GI bleed is strong possibility , especially after he was started on Xarelto 8 months ago.  EGD and colon did not show bleeding, plan for capsule study as outpatient. Discussed with Dr Deatra Ina, he will follow CBC in three days and also will send on heamoccult cards x 3. Will take the cards to office.   Atrial fibrillation- HR controlled, continue with amiodarone. Continue xarelto  . Nonischemic cardiomyopathy- Has EF of 20-30%, s/p defibrillator, appears well compensated   Procedures:  *EGD - negative, biopsies obtained  Colonoscopy- Hemorrhoids  Consultations:  GI  Discharge Exam: Filed Vitals:   10/18/13 0432  BP: 139/69  Pulse: 64  Temp: 98.5 F (36.9 C)  Resp: 18    General: Appear in no acute distress Cardiovascular: S1s2 RRR Respiratory: Clear bilaterally  Discharge Instructions You were cared for by a hospitalist during your hospital stay. If you have any questions about your discharge medications or the care you received while you were in the hospital after you are discharged, you can call the unit and asked to speak with the hospitalist on call if the hospitalist that took care of you is not available. Once you are discharged, your primary care  physician will handle any further medical issues. Please note that NO REFILLS for any discharge medications will be authorized once you are discharged, as it is imperative that you return to your primary care physician (or establish a relationship with a primary care physician if you do not have one) for your aftercare needs so that they can reassess your need for medications and monitor your lab values.  Discharge Instructions   Diet - low sodium heart healthy    Complete by:  As directed      Discharge instructions    Complete by:  As directed   Go to Lab on Monday for blood work     Increase activity slowly    Complete by:  As directed             Medication List         allopurinol 300 MG tablet  Commonly known as:  ZYLOPRIM  Take 300 mg by mouth 2 (two) times daily.     amiodarone 200 MG tablet  Commonly known as:  PACERONE  TAKE 1 TABLET BY MOUTH DAILY.     amLODipine 5 MG tablet  Commonly known as:  NORVASC  TAKE 1 TABLET BY MOUTH EVERY DAY     atorvastatin 20 MG tablet  Commonly known as:  LIPITOR  TAKE 1 TABLET BY MOUTH EVERY DAY     buPROPion 150 MG 12 hr tablet  Commonly known as:  WELLBUTRIN SR  Take 1 tablet (150 mg total) by mouth 2 (two) times daily.     carvedilol 25 MG tablet  Commonly known as:  COREG  Take 1 tablet (25 mg total) by mouth 2 (two) times daily.     ferrous gluconate 324 MG tablet  Commonly known as:  FERGON  Take 1 tablet (324 mg total) by mouth 3 (three) times daily with meals.     hydrALAZINE 50 MG tablet  Commonly known as:  APRESOLINE  TAKE 1 TABLET (50 MG TOTAL) BY MOUTH 3 (THREE) TIMES DAILY.     KLOR-CON M20 20 MEQ tablet  Generic drug:  potassium chloride SA  TAKE 1 TABLET BY MOUTH EVERY DAY     lisinopril 40 MG tablet  Commonly known as:  PRINIVIL,ZESTRIL  TAKE 1 TABLET BY MOUTH EVERY DAY     niacin 1000 MG CR tablet  Commonly known as:  NIASPAN  TAKE 1 TABLET BY MOUTH AT BEDTIME     rivaroxaban 20 MG Tabs tablet   Commonly known as:  XARELTO  Take 1 tablet (20 mg total) by mouth daily.     spironolactone 25 MG tablet  Commonly known as:  ALDACTONE  Take 12.5 mg by mouth daily.     VIAGRA 100 MG tablet  Generic drug:  sildenafil  Take 100 mg by mouth daily as needed for erectile dysfunction. As directed       No Known Allergies    The results of significant diagnostics from this hospitalization (including imaging, microbiology, ancillary and laboratory) are listed below for reference.    Significant Diagnostic Studies: Dg Chest  2 View  10/15/2013   CLINICAL DATA:  Shortness of breath  EXAM: CHEST  2 VIEW  COMPARISON:  October 04, 2012  FINDINGS: The heart size and mediastinal contours are stable. Cardiac pacemaker is unchanged. There is no focal infiltrate, pulmonary edema, or pleural effusion. The visualized skeletal structures are stable.  IMPRESSION: No active cardiopulmonary disease.   Electronically Signed   By: Abelardo Diesel M.D.   On: 10/15/2013 15:58    Microbiology: No results found for this or any previous visit (from the past 240 hour(s)).   Labs: Basic Metabolic Panel:  Recent Labs Lab 10/15/13 1511 10/15/13 1715 10/16/13 1016 10/17/13 0430  NA 139 141 139 143  K 3.7 4.2 4.1 3.6*  CL 107 103 101 105  CO2 24 21 22 25   GLUCOSE 101* 108* 100* 84  BUN 21 23 22 17   CREATININE 1.6* 1.86* 1.51* 1.39*  CALCIUM 9.2 9.3 9.3 8.9   Liver Function Tests:  Recent Labs Lab 10/15/13 1931 10/16/13 1016  AST 22 22  ALT 18 18  ALKPHOS 119* 125*  BILITOT 0.3 0.4  PROT 7.2 7.0  ALBUMIN 3.7 3.6   No results found for this basename: LIPASE, AMYLASE,  in the last 168 hours No results found for this basename: AMMONIA,  in the last 168 hours CBC:  Recent Labs Lab 10/15/13 1511 10/15/13 1715 10/16/13 0039  10/16/13 1016 10/16/13 2010 10/17/13 0430 10/17/13 1040 10/17/13 1825 10/18/13 0315  WBC 9.0 7.2 7.2  --  6.6 7.7  --   --   --   --   NEUTROABS  --   --  4.5  --  4.4  5.5  --   --   --   --   HGB 5.2 Repeated and verified X2.* 5.1* 5.4*  < > 6.7* 7.5* 7.3* 7.1* 7.7* 7.2*  HCT 18.3 Repeated and verified X2.* 19.5* 19.8*  < > 23.9* 26.1* 25.3* 24.7* 27.1* 25.0*  MCV 60.4* 64.4* 64.7*  --  65.8* 68.1*  --   --   --   --   PLT 303.0 270 228  --  246 235  --   --   --   --   < > = values in this interval not displayed. Cardiac Enzymes: No results found for this basename: CKTOTAL, CKMB, CKMBINDEX, TROPONINI,  in the last 168 hours BNP: BNP (last 3 results)  Recent Labs  10/15/13 1511  PROBNP 29.0   CBG: No results found for this basename: GLUCAP,  in the last 168 hours     Signed:  Lavi Sheehan S  [facility] 10/18/2013, 11:17 AM

## 2013-10-18 NOTE — Progress Notes (Signed)
Patient discharge teaching given, including activity, diet, follow-up appoints, and medications. Patient verbalized understanding of all discharge instructions. IV access was d/c'd. Vitals are stable. Skin is intact except as charted in most recent assessments. Pt to be escorted out by NT, to be driven home by family. 

## 2013-10-18 NOTE — Progress Notes (Signed)
Progress Note   Subjective  *No complaints**   Objective  Vital signs in last 24 hours: Temp:  [98.1 F (36.7 C)-98.5 F (36.9 C)] 98.5 F (36.9 C) (07/18 0432) Pulse Rate:  [57-66] 64 (07/18 0432) Resp:  [10-22] 18 (07/18 0432) BP: (110-144)/(67-84) 139/69 mmHg (07/18 0432) SpO2:  [96 %-100 %] 99 % (07/18 0432) Weight:  [288 lb 1.6 oz (130.681 kg)] 288 lb 1.6 oz (130.681 kg) (07/18 0500) Last BM Date: 10/17/13 General:   Alert,  Well-developed,   in NAD Heart:  Regular rate and rhythm; no murmurs Abdomen:  Soft, nontender and nondistended. Normal bowel sounds, without guarding, and without rebound.   Extremities:  Without edema. Neurologic:  Alert and  oriented x4;  grossly normal neurologically. Psych:  Alert and cooperative. Normal mood and affect.  Intake/Output from previous day: 07/17 0701 - 07/18 0700 In: 380 [P.O.:380] Out: 381 [Urine:381] Intake/Output this shift: Total I/O In: 240 [P.O.:240] Out: -   Lab Results:  Recent Labs  10/16/13 0039  10/16/13 1016 10/16/13 2010  10/17/13 1040 10/17/13 1825 10/18/13 0315  WBC 7.2  --  6.6 7.7  --   --   --   --   HGB 5.4*  < > 6.7* 7.5*  < > 7.1* 7.7* 7.2*  HCT 19.8*  < > 23.9* 26.1*  < > 24.7* 27.1* 25.0*  PLT 228  --  246 235  --   --   --   --   < > = values in this interval not displayed. BMET  Recent Labs  10/15/13 1715 10/16/13 1016 10/17/13 0430  NA 141 139 143  K 4.2 4.1 3.6*  CL 103 101 105  CO2 21 22 25   GLUCOSE 108* 100* 84  BUN 23 22 17   CREATININE 1.86* 1.51* 1.39*  CALCIUM 9.3 9.3 8.9   LFT  Recent Labs  10/15/13 1931 10/16/13 1016  PROT 7.2 7.0  ALBUMIN 3.7 3.6  AST 22 22  ALT 18 18  ALKPHOS 119* 125*  BILITOT 0.3 0.4  BILIDIR <0.2  --   IBILI NOT CALCULATED  --    PT/INR  Recent Labs  10/15/13 1931  LABPROT 29.7*  INR 2.82*   Hepatitis Panel No results found for this basename: HEPBSAG, HCVAB, HEPAIGM, HEPBIGM,  in the last 72 hours  Studies/Results: No  results found.    Assessment & Plan  *Iron deficiency anemia.  Etiology has not been determined.  No bleeding sources were seen by upper endoscopy or colonoscopy.  Biopsies were taken to rule out malabsorption.  Stool Hemoccults are not available but Hemoccult negative on admission.  Presumably anemia is 2 to chronic GI blood loss*although counts did not rise appreciably after 2 units of packed cells,  and he has had no overt bleeding.  Primary hematologic abnormality remains a consideration as well.  Recommendations 1.  DC home on iron 2.  followup CBC early next week; Hemoccults 3.  GI followup in 2 weeks* 4.  should he be Hemoccult-positive then I will arrange for a capsule study as an outpatient Principal Problem:   Symptomatic anemia Active Problems:   OVERWEIGHT/OBESITY   TOBACCO ABUSE   OBSTRUCTIVE SLEEP APNEA   HYPERTENSION, BENIGN   Chronic anticoagulation   Chronic systolic heart failure   Nonischemic cardiomyopathy   Iron deficiency anemia, unspecified   Internal hemorrhoids without mention of complication     LOS: 3 days   Erskine Emery  10/18/2013, 9:31 AM

## 2013-10-19 LAB — TYPE AND SCREEN
ABO/RH(D): B POS
Antibody Screen: NEGATIVE
UNIT DIVISION: 0
UNIT DIVISION: 0
Unit division: 0
Unit division: 0

## 2013-10-20 ENCOUNTER — Encounter (HOSPITAL_COMMUNITY): Payer: Self-pay | Admitting: Gastroenterology

## 2013-10-20 ENCOUNTER — Telehealth: Payer: Self-pay

## 2013-10-20 DIAGNOSIS — D509 Iron deficiency anemia, unspecified: Secondary | ICD-10-CM

## 2013-10-20 NOTE — Telephone Encounter (Signed)
Message copied by Algernon Huxley on Mon Oct 20, 2013  2:34 PM ------      Message from: Erskine Emery D      Created: Sat Oct 18, 2013  9:38 AM       Vaughan Basta,      Patient needs followup appointment in 2 weeks and a CBC in 2 weeks ------

## 2013-10-20 NOTE — Telephone Encounter (Signed)
Pt scheduled to see Alonza Bogus PA 11/05/13@1 :30pm. Appt letter mailed to pt.

## 2013-10-22 ENCOUNTER — Telehealth: Payer: Self-pay | Admitting: Internal Medicine

## 2013-10-22 ENCOUNTER — Observation Stay: Admit: 2013-10-22 | Payer: Self-pay | Admitting: Gastroenterology

## 2013-10-22 NOTE — Telephone Encounter (Signed)
New message    FYI .     Anything they can do for as case management to let her know.

## 2013-10-23 ENCOUNTER — Other Ambulatory Visit: Payer: Self-pay

## 2013-10-23 DIAGNOSIS — D509 Iron deficiency anemia, unspecified: Secondary | ICD-10-CM

## 2013-10-29 ENCOUNTER — Encounter (HOSPITAL_COMMUNITY)
Admission: RE | Disposition: A | Discharge status: Home / Self Care | Payer: Self-pay | Source: Ambulatory Visit | Attending: Gastroenterology

## 2013-10-29 ENCOUNTER — Observation Stay (HOSPITAL_COMMUNITY)
Admission: RE | Admit: 2013-10-29 | Discharge: 2013-10-29 | Disposition: A | Discharge status: Home / Self Care | Payer: No Typology Code available for payment source | Source: Ambulatory Visit | Attending: Gastroenterology | Admitting: Gastroenterology

## 2013-10-29 ENCOUNTER — Encounter (HOSPITAL_COMMUNITY): Payer: Self-pay

## 2013-10-29 DIAGNOSIS — J449 Chronic obstructive pulmonary disease, unspecified: Secondary | ICD-10-CM | POA: Insufficient documentation

## 2013-10-29 DIAGNOSIS — D649 Anemia, unspecified: Secondary | ICD-10-CM | POA: Diagnosis present

## 2013-10-29 DIAGNOSIS — Z95810 Presence of automatic (implantable) cardiac defibrillator: Secondary | ICD-10-CM | POA: Insufficient documentation

## 2013-10-29 DIAGNOSIS — Z96649 Presence of unspecified artificial hip joint: Secondary | ICD-10-CM | POA: Insufficient documentation

## 2013-10-29 DIAGNOSIS — D509 Iron deficiency anemia, unspecified: Principal | ICD-10-CM | POA: Insufficient documentation

## 2013-10-29 DIAGNOSIS — E785 Hyperlipidemia, unspecified: Secondary | ICD-10-CM | POA: Insufficient documentation

## 2013-10-29 DIAGNOSIS — I10 Essential (primary) hypertension: Secondary | ICD-10-CM | POA: Insufficient documentation

## 2013-10-29 DIAGNOSIS — Z79899 Other long term (current) drug therapy: Secondary | ICD-10-CM | POA: Insufficient documentation

## 2013-10-29 DIAGNOSIS — J4489 Other specified chronic obstructive pulmonary disease: Secondary | ICD-10-CM | POA: Insufficient documentation

## 2013-10-29 DIAGNOSIS — Z7901 Long term (current) use of anticoagulants: Secondary | ICD-10-CM | POA: Insufficient documentation

## 2013-10-29 DIAGNOSIS — I4891 Unspecified atrial fibrillation: Secondary | ICD-10-CM | POA: Insufficient documentation

## 2013-10-29 DIAGNOSIS — G4733 Obstructive sleep apnea (adult) (pediatric): Secondary | ICD-10-CM | POA: Insufficient documentation

## 2013-10-29 DIAGNOSIS — F17200 Nicotine dependence, unspecified, uncomplicated: Secondary | ICD-10-CM | POA: Insufficient documentation

## 2013-10-29 DIAGNOSIS — I428 Other cardiomyopathies: Secondary | ICD-10-CM | POA: Insufficient documentation

## 2013-10-29 DIAGNOSIS — M109 Gout, unspecified: Secondary | ICD-10-CM | POA: Insufficient documentation

## 2013-10-29 HISTORY — PX: GIVENS CAPSULE STUDY: SHX5432

## 2013-10-29 SURGERY — IMAGING PROCEDURE, GI TRACT, INTRALUMINAL, VIA CAPSULE
Anesthesia: LOCAL

## 2013-10-29 MED ORDER — AMIODARONE HCL 200 MG PO TABS
200.0000 mg | ORAL_TABLET | Freq: Every day | ORAL | Status: DC
  Administered 2013-10-29: 200 mg via ORAL
  Filled 2013-10-29: qty 1

## 2013-10-29 MED ORDER — BUPROPION HCL ER (SR) 150 MG PO TB12
150.0000 mg | ORAL_TABLET | Freq: Two times a day (BID) | ORAL | Status: DC
  Administered 2013-10-29: 150 mg via ORAL
  Filled 2013-10-29: qty 1

## 2013-10-29 MED ORDER — NIACIN ER (ANTIHYPERLIPIDEMIC) 500 MG PO TBCR
1000.0000 mg | EXTENDED_RELEASE_TABLET | Freq: Every day | ORAL | Status: DC
  Administered 2013-10-29: 1000 mg via ORAL
  Filled 2013-10-29: qty 2

## 2013-10-29 MED ORDER — HYDRALAZINE HCL 50 MG PO TABS
50.0000 mg | ORAL_TABLET | Freq: Three times a day (TID) | ORAL | Status: DC
  Administered 2013-10-29 (×2): 50 mg via ORAL
  Filled 2013-10-29 (×2): qty 1

## 2013-10-29 MED ORDER — POTASSIUM CHLORIDE CRYS ER 20 MEQ PO TBCR
20.0000 meq | EXTENDED_RELEASE_TABLET | Freq: Every day | ORAL | Status: DC
  Filled 2013-10-29: qty 1

## 2013-10-29 MED ORDER — ATORVASTATIN CALCIUM 20 MG PO TABS
20.0000 mg | ORAL_TABLET | Freq: Every day | ORAL | Status: DC
  Administered 2013-10-29: 20 mg via ORAL
  Filled 2013-10-29: qty 1

## 2013-10-29 MED ORDER — ALLOPURINOL 300 MG PO TABS
300.0000 mg | ORAL_TABLET | Freq: Two times a day (BID) | ORAL | Status: DC
  Administered 2013-10-29: 300 mg via ORAL
  Filled 2013-10-29: qty 1

## 2013-10-29 MED ORDER — CARVEDILOL 25 MG PO TABS
25.0000 mg | ORAL_TABLET | Freq: Two times a day (BID) | ORAL | Status: DC
  Administered 2013-10-29: 25 mg via ORAL
  Filled 2013-10-29 (×2): qty 1

## 2013-10-29 MED ORDER — AMLODIPINE BESYLATE 5 MG PO TABS
5.0000 mg | ORAL_TABLET | Freq: Every day | ORAL | Status: DC
  Administered 2013-10-29: 5 mg via ORAL
  Filled 2013-10-29: qty 1

## 2013-10-29 MED ORDER — LISINOPRIL 40 MG PO TABS
40.0000 mg | ORAL_TABLET | Freq: Every day | ORAL | Status: DC
  Administered 2013-10-29: 40 mg via ORAL
  Filled 2013-10-29: qty 1

## 2013-10-29 MED ORDER — SODIUM CHLORIDE 0.9 % IJ SOLN: 3.0000 mL | Freq: Two times a day (BID) | INTRAMUSCULAR | Status: DC

## 2013-10-29 MED ORDER — RIVAROXABAN 20 MG PO TABS
20.0000 mg | ORAL_TABLET | Freq: Every day | ORAL | Status: DC
  Administered 2013-10-29: 20 mg via ORAL
  Filled 2013-10-29: qty 1

## 2013-10-29 MED ORDER — SPIRONOLACTONE 12.5 MG HALF TABLET
12.5000 mg | ORAL_TABLET | Freq: Every day | ORAL | Status: DC
  Administered 2013-10-29: 12.5 mg via ORAL
  Filled 2013-10-29: qty 1

## 2013-10-29 SURGICAL SUPPLY — 1 items: TOWEL COTTON PACK 4EA (MISCELLANEOUS) ×4 IMPLANT

## 2013-10-29 NOTE — H&P (Signed)
Admission Note  Primary Care Physician:  [name], MD Primary Gastroenterologist:    MD  HPI: Jason Russell is a 63 y.o. male with a significant cardiac history including nonischemic cardiomyopathy, atrial fibrillation and hypertension. Patient has an AICD, he is on chronic Xarelto. We recently saw the patient in the hospital for evaluation of microcytic anemia. Upper endoscopy was unremarkable except for a hiatal hernia and his normal colonoscopy was normal with the exception of hemorrhoids. For further evaluation of anemia patient is undergoing a small bowel video capsule study. He has to be on telemetry during time of capsule study because of his defribrillator. Patient has passed a black stool on occasion, no other overt GI bleeding   Past Medical History  Diagnosis Date  . Hypertension   . Dyslipidemia   . Obesity   . Atrial fibrillation     Amiodarone started 10/2011; Coumadin  . Tobacco abuse   . COPD (chronic obstructive pulmonary disease)   . NICM (nonischemic cardiomyopathy)     LHC 4/14:  minimal CAD  . Chronic systolic heart failure     a. Echo 7/13: EF 25%;  b. echo 04/2012:  Mild LVH, EF 30-35%, Gr 1 DD, mild AI, mild MR, mild LAE  . 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 St. Jude ICD implantation 10/03/12.  . Chronic anticoagulation   . Asthma     "when I was a child"  . OSA on CPAP   . Anemia   . History of blood transfusion 10/15/2013    "don't know where the blood's going; HgB down to 5"  . Arthritis     "hx right hip"  . Gout     Past Surgical History  Procedure Laterality Date  . Total hip arthroplasty Right 11/25/1997  . Cardioversion  2011  . Tonsillectomy  1950's  . Cardiac defibrillator placement  2014  . Joint replacement    . Colonoscopy with propofol Left 10/17/2013    Procedure: COLONOSCOPY WITH PROPOFOL;  Surgeon: Inda Castle, MD;  Location: [place];  Service: Endoscopy;  Laterality: Left;   . Esophagogastroduodenoscopy N/A 10/17/2013    Procedure: ESOPHAGOGASTRODUODENOSCOPY (EGD);  Surgeon: Inda Castle, MD;  Location: [place];  Service: Endoscopy;  Laterality: N/A;    Prior to Admission medications   Medication Sig Start Date End Date Taking? Authorizing Provider  allopurinol (ZYLOPRIM) 300 MG tablet Take 300 mg by mouth 2 (two) times daily.  12/18/10   Historical Provider, MD  amiodarone (PACERONE) 200 MG tablet TAKE 1 TABLET BY MOUTH DAILY.    Fay Records, MD  amLODipine (NORVASC) 5 MG tablet TAKE 1 TABLET BY MOUTH EVERY DAY    Fay Records, MD  atorvastatin (LIPITOR) 20 MG tablet TAKE 1 TABLET BY MOUTH EVERY DAY 08/04/13   Fay Records, MD  buPROPion [facility] SR) 150 MG 12 hr tablet Take 1 tablet (150 mg total) by mouth 2 (two) times daily. 08/18/13   Fay Records, MD  carvedilol (COREG) 25 MG tablet Take 1 tablet (25 mg total) by mouth 2 (two) times daily. 06/17/12   Fay Records, MD  ferrous gluconate (FERGON) 324 MG tablet Take 1 tablet (324 mg total) by mouth 3 (three) times daily with meals. 10/18/13   Oswald Hillock, MD  hydrALAZINE (APRESOLINE) 50 MG tablet TAKE 1 TABLET (50 MG TOTAL) BY MOUTH 3 (THREE) TIMES DAILY. 09/15/13   Fay Records, MD  KLOR-CON  M20 20 MEQ tablet TAKE 1 TABLET BY MOUTH EVERY DAY 07/01/13   Fay Records, MD  lisinopril (PRINIVIL,ZESTRIL) 40 MG tablet TAKE 1 TABLET BY MOUTH EVERY DAY 09/30/13   Fay Records, MD  niacin (NIASPAN) 1000 MG CR tablet TAKE 1 TABLET BY MOUTH AT BEDTIME    Fay Records, MD  Rivaroxaban (XARELTO) 20 MG TABS tablet Take 1 tablet (20 mg total) by mouth daily. 01/31/13   Fay Records, MD  spironolactone (ALDACTONE) 25 MG tablet Take 12.5 mg by mouth daily.    Historical Provider, MD  VIAGRA 100 MG tablet Take 100 mg by mouth daily as needed for erectile dysfunction. As directed 10/28/12   Historical Provider, MD    Current Facility-Administered Medications  Medication Dose Route Frequency Provider Last Rate Last Dose  .  sodium chloride 0.9 % injection 3 mL  3 mL Intravenous Q12H Willia Craze, NP        Allergies as of 10/22/2013  . (No Known Allergies)    Family History  Problem Relation Age of Onset  . Other Father     deceased- unknow reason  . Hypertension Mother   . Lung cancer Brother     History   Social History  . Marital Status: Married    Spouse Name: N/A    Number of Children: 1  . Years of Education: N/A   Occupational History  . 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 Smoking status: Current Every Day Smoker -- 0.25 packs/day for 45 years    Types: Cigarettes  . Smokeless tobacco: Never Used  . Alcohol Use: 0.6 oz/week    1 Cans of beer per week  . Drug Use: No  . Sexual Activity: Not Currently   Other Topics Concern  . Not on file   Social History Narrative  . No narrative on file    Review of Systems:  All systems reviewed an negative except where noted in HPI.  GPhysical Exam: Vital signs in last 24 hours: Pulse Rate:  [75] 75 (07/29 0945) Resp:  [15] 15 (07/29 0945) BP: (188)/(85) 188/85 mmHg (07/29 0945) SpO2:  [100 %] 100 % (07/29 0945) Weight:  [288 lb (130.636 kg)] 288 lb (130.636 kg) (07/29 0907)   General:  Pleasant, well-developed, black male in NAD Head:  Normocephalic and atraumatic. Eyes:  Sclera clear, no icterus.   Conjunctiva pink. Ears:  Normal auditory acuity. Neck:  Supple; no masses . Lungs:  Clear throughout to auscultation.   No wheezes, crackles, or rhonchi. No acute distress. Heart:  Regular rate and rhythm; no murmurs. Abdomen:  Soft, obese, nontender. No masses. No obvious masses.  Normal bowel .    Msk:  Symmetrical without gross deformities.. Pulses:  Normal pulses noted. Extremities:  Without edema. Neurologic:  Alert and  oriented x4;  grossly normal neurologically. Skin:  Intact without significant lesions or rashes. Cervical Nodes:  No significant cervical adenopathy. Psych:  Alert and  cooperative. Normal mood and affect.  Impression / Plan:   #34.63 year old male with microcytic anemia. hgb stable in low to mid 7 range. EGD and colonoscopy earlier this month were unrevealing. Patient to be admitted for observational stay for a small bowel video capsule study( this is required because he has an AICD). Will continue home medications this evening. Anticipate discharge later this evening after study complete. See admission orders for additional information.  #2. Atrial fibrillation/cardiomyopathy. Patient has an AICD. He  is on chronic Xarelto.     LOS: 0 days   Tye Savoy  10/29/2013, 3:06 PM

## 2013-10-29 NOTE — H&P (Signed)
[facility] Discharge Summary  Name: Jason Russell MRN: [id number] DOB: July 13, 1950 63 y.o. PCP:  [name], MD   Date of Admission: 10/29/2013  8:41 AM Date of Discharge: 10/29/2013 Primary Gastroenterologist: Erskine Emery, MD Discharging Physician: Erskine Emery, MD  Discharge Diagnosis: 1. Iron deficiency anemia 2. Multiple medical problems including, but no limited to atrial fibrillation, nonischemic carddiomyopathy, AICD placement, and COPD   Consultations:none  Procedures Performed:  Dg Chest 2 View  10/15/2013   CLINICAL DATA:  Shortness of breath  EXAM: CHEST  2 VIEW  COMPARISON:  October 04, 2012  FINDINGS: The heart size and mediastinal contours are stable. Cardiac pacemaker is unchanged. There is no focal infiltrate, pulmonary edema, or pleural effusion. The visualized skeletal structures are stable.  IMPRESSION: No active cardiopulmonary disease.   Electronically Signed   By: Abelardo Diesel M.D.   On: 10/15/2013 15:58    GI Procedures: Givens capsule study  History/Physical Exam:  See Admission H&P  Admission HPI: Jason Russell is a 63 y.o. male with a significant cardiac history including nonischemic cardiomyopathy, atrial fibrillation and hypertension. Patient has an AICD, he is on chronic Xarelto. We recently saw the patient in the hospital for evaluation of microcytic anemia. Upper endoscopy was unremarkable except for a hiatal hernia and his normal colonoscopy was normal with the exception of hemorrhoids. For further evaluation of anemia patient is undergoing a small bowel video capsule study. He has to be on telemetry during time of capsule study because of his defribrillator. Patient has passed a black stool on occasion, no other overt GI bleeding   Hospital Course by problem list:  1. Iron Deficiency anemia. Because of AICD patient required observation while undergoing Givens capsule study. He had a short, uneventful stay.   2. Multiple medical  problems. No acute issues during the few hours patient was hospitalized.   Discharge Vitals:  BP 146/73  Pulse 70  Temp(Src) 98.1 F (36.7 C) (Oral)  Resp 18  Ht 6\' 1"  (1.854 m)  Wt 288 lb (130.636 kg)  BMI 38.01 kg/m2  SpO2 98%  Physical Exam General: pleasant black male in NAD Cardiac: RRR Pulmonary: Lungs CTA bilaterally Abdominal: soft, nontender, nondistended Neuro: Alert and oriented Psych: pleasant, cooperative  Discharge Labs: No results found for this or any previous visit (from the past 24 hour(s)).  Disposition and follow-up:   Mr.Jason Russell was [facility] in [facility] condition.    Follow-up Appointments: Discharge Instructions   Discharge instructions    Complete by:  As directed      Discontinue IV    Complete by:  As directed   D/C IV at discharge     Resume previous diet    Complete by:  As directed            Discharge Medications:   Medication List         allopurinol 300 MG tablet  Commonly known as:  ZYLOPRIM  Take 300 mg by mouth 2 (two) times daily.     amiodarone 200 MG tablet  Commonly known as:  PACERONE  Take 200 mg by mouth daily.     amLODipine 5 MG tablet  Commonly known as:  NORVASC  Take 5 mg by mouth daily.     atorvastatin 20 MG tablet  Commonly known as:  LIPITOR  Take 20 mg by mouth daily.     buPROPion 150 MG 12 hr tablet  Commonly known as:  WELLBUTRIN SR  Take  150 mg by mouth 2 (two) times daily.     carvedilol 25 MG tablet  Commonly known as:  COREG  Take 25 mg by mouth 2 (two) times daily with a meal.     ferrous gluconate 324 MG tablet  Commonly known as:  FERGON  Take 324 mg by mouth 3 (three) times daily with meals.     hydrALAZINE 50 MG tablet  Commonly known as:  APRESOLINE  Take 50 mg by mouth 3 (three) times daily.     lisinopril 40 MG tablet  Commonly known as:  PRINIVIL,ZESTRIL  Take 40 mg by mouth daily.     niacin 1000 MG CR tablet  Commonly known as:   NIASPAN  Take 1,000 mg by mouth at bedtime.     potassium chloride SA 20 MEQ tablet  Commonly known as:  K-DUR,KLOR-CON  Take 20 mEq by mouth daily.     spironolactone 25 MG tablet  Commonly known as:  ALDACTONE  Take 12.5 mg by mouth daily.     VIAGRA 100 MG tablet  Generic drug:  sildenafil  Take 100 mg by mouth daily as needed for erectile dysfunction. As directed        Signed: Tye Savoy 10/29/2013, 5:09 PM

## 2013-10-29 NOTE — Progress Notes (Signed)
Pt still has blue flashing light on pill cam. Procedure d/t stop around 930. Pt w/o any s/s of distress or pain. Called md on call to see if pt needs to stay or can be d/c. Awaiting call back.

## 2013-10-29 NOTE — Progress Notes (Signed)
MD stark stated that after about 6-8 hours pill cam no longer gathering data. Charge nurse and pt informed. md stated that pt can stay the night or go home now. PT stated he wanted to go home. Pt given d/c papers with instructions. Pt with proper teach back. Pt denies any sob or chest pain. Pt called ride to come and pick up. Will continue to monitor until pt off unit.

## 2013-10-30 ENCOUNTER — Encounter (HOSPITAL_COMMUNITY): Payer: Self-pay | Admitting: Gastroenterology

## 2013-11-02 ENCOUNTER — Other Ambulatory Visit: Payer: Self-pay | Admitting: Internal Medicine

## 2013-11-03 ENCOUNTER — Other Ambulatory Visit: Payer: Self-pay | Admitting: Internal Medicine

## 2013-11-03 NOTE — Telephone Encounter (Signed)
**Note De-identified  Obfuscation** Noted  

## 2013-11-05 ENCOUNTER — Encounter: Payer: No Typology Code available for payment source | Admitting: Gastroenterology

## 2013-11-05 ENCOUNTER — Encounter: Payer: Self-pay | Admitting: Gastroenterology

## 2013-11-05 NOTE — Patient Instructions (Signed)
We have given you a work note today.  We will be in touch with you about results of your capsule endoscopy once it has been read.  CC:Dr Eligah East

## 2013-11-07 ENCOUNTER — Encounter: Payer: Self-pay | Admitting: Internal Medicine

## 2013-11-10 NOTE — Progress Notes (Signed)
This encounter was created in error - please disregard.

## 2013-11-15 ENCOUNTER — Other Ambulatory Visit: Payer: Self-pay | Admitting: Internal Medicine

## 2013-11-17 ENCOUNTER — Other Ambulatory Visit: Payer: Self-pay

## 2013-11-17 ENCOUNTER — Other Ambulatory Visit: Payer: Self-pay | Admitting: Internal Medicine

## 2013-11-17 ENCOUNTER — Telehealth: Payer: Self-pay

## 2013-11-17 DIAGNOSIS — D508 Other iron deficiency anemias: Secondary | ICD-10-CM

## 2013-11-17 NOTE — Telephone Encounter (Signed)
Message copied by Greggory Keen on Mon Nov 17, 2013  9:39 AM ------      Message from: Erskine Emery D      Created: Mon Nov 17, 2013  9:01 AM       Please inform the patient that the capsule study demonstrated a single AVM.  This is an abnormal vessel which potentially can cause chronic bleeding.            He needs Hemoccults x3.  If positive this would help to confirm the diagnosis.  He also needs a CBC. ------

## 2013-11-17 NOTE — Telephone Encounter (Signed)
Patient contacted with the Endo capsule result. He agrees to come in for labs and wants to pick up his stool cards instead of receiving them through the mail. Cards at the front desk for pick up.

## 2013-11-20 ENCOUNTER — Other Ambulatory Visit (INDEPENDENT_AMBULATORY_CARE_PROVIDER_SITE_OTHER): Payer: No Typology Code available for payment source

## 2013-11-20 DIAGNOSIS — D508 Other iron deficiency anemias: Secondary | ICD-10-CM

## 2013-11-20 LAB — CBC WITH DIFFERENTIAL/PLATELET
BASOS ABS: 0 10*3/uL (ref 0.0–0.1)
Basophils Relative: 0.6 % (ref 0.0–3.0)
Eosinophils Absolute: 0.2 10*3/uL (ref 0.0–0.7)
Eosinophils Relative: 3 % (ref 0.0–5.0)
HEMATOCRIT: 32.8 % — AB (ref 39.0–52.0)
Hemoglobin: 10.3 g/dL — ABNORMAL LOW (ref 13.0–17.0)
LYMPHS ABS: 1.1 10*3/uL (ref 0.7–4.0)
Lymphocytes Relative: 16.1 % (ref 12.0–46.0)
MCHC: 31.5 g/dL (ref 30.0–36.0)
MCV: 81.1 fl (ref 78.0–100.0)
MONO ABS: 0.7 10*3/uL (ref 0.1–1.0)
Monocytes Relative: 10.1 % (ref 3.0–12.0)
NEUTROS PCT: 70.2 % (ref 43.0–77.0)
Neutro Abs: 4.9 10*3/uL (ref 1.4–7.7)
PLATELETS: 179 10*3/uL (ref 150.0–400.0)
RBC: 4.05 Mil/uL — ABNORMAL LOW (ref 4.22–5.81)
RDW: 34.8 % — AB (ref 11.5–15.5)
WBC: 7 10*3/uL (ref 4.0–10.5)

## 2013-11-28 ENCOUNTER — Ambulatory Visit: Payer: No Typology Code available for payment source | Admitting: Internal Medicine

## 2013-12-03 ENCOUNTER — Other Ambulatory Visit: Payer: Self-pay | Admitting: *Deleted

## 2013-12-03 ENCOUNTER — Other Ambulatory Visit (INDEPENDENT_AMBULATORY_CARE_PROVIDER_SITE_OTHER): Payer: No Typology Code available for payment source

## 2013-12-03 DIAGNOSIS — D649 Anemia, unspecified: Secondary | ICD-10-CM

## 2013-12-04 LAB — HEMOCCULT SLIDES (X 3 CARDS)
Fecal Occult Blood: NEGATIVE
OCCULT 1: NEGATIVE
OCCULT 2: NEGATIVE
OCCULT 3: NEGATIVE
OCCULT 4: NEGATIVE
OCCULT 5: NEGATIVE

## 2013-12-05 ENCOUNTER — Other Ambulatory Visit: Payer: Self-pay | Admitting: Internal Medicine

## 2013-12-23 ENCOUNTER — Other Ambulatory Visit: Payer: Self-pay | Admitting: Internal Medicine

## 2014-01-30 ENCOUNTER — Other Ambulatory Visit: Payer: Self-pay | Admitting: Internal Medicine

## 2014-02-03 ENCOUNTER — Other Ambulatory Visit: Payer: Self-pay | Admitting: Internal Medicine

## 2014-02-04 ENCOUNTER — Other Ambulatory Visit: Payer: Self-pay | Admitting: Internal Medicine

## 2014-02-05 ENCOUNTER — Other Ambulatory Visit: Payer: Self-pay | Admitting: Internal Medicine

## 2014-02-10 ENCOUNTER — Other Ambulatory Visit: Payer: Self-pay | Admitting: Internal Medicine

## 2014-02-11 ENCOUNTER — Encounter: Payer: Self-pay | Admitting: *Deleted

## 2014-02-14 ENCOUNTER — Other Ambulatory Visit: Payer: Self-pay | Admitting: Internal Medicine

## 2014-02-18 ENCOUNTER — Other Ambulatory Visit: Payer: Self-pay | Admitting: Internal Medicine

## 2014-03-06 ENCOUNTER — Other Ambulatory Visit: Payer: Self-pay | Admitting: Internal Medicine

## 2014-03-07 ENCOUNTER — Other Ambulatory Visit: Payer: Self-pay | Admitting: Internal Medicine

## 2014-03-12 ENCOUNTER — Encounter (HOSPITAL_COMMUNITY): Payer: Self-pay | Admitting: Internal Medicine

## 2014-03-15 ENCOUNTER — Other Ambulatory Visit: Payer: Self-pay | Admitting: Internal Medicine

## 2014-03-16 ENCOUNTER — Other Ambulatory Visit: Payer: Self-pay | Admitting: Internal Medicine

## 2014-03-20 ENCOUNTER — Ambulatory Visit: Payer: No Typology Code available for payment source | Admitting: Pulmonary Disease

## 2014-03-25 ENCOUNTER — Encounter: Payer: Self-pay | Admitting: *Deleted

## 2014-04-01 ENCOUNTER — Other Ambulatory Visit: Payer: Self-pay | Admitting: Internal Medicine

## 2014-04-07 ENCOUNTER — Other Ambulatory Visit: Payer: Self-pay | Admitting: Internal Medicine

## 2014-04-14 ENCOUNTER — Encounter: Payer: Self-pay | Admitting: *Deleted

## 2014-04-17 ENCOUNTER — Encounter: Payer: Self-pay | Admitting: Gastroenterology

## 2014-04-18 ENCOUNTER — Other Ambulatory Visit: Payer: Self-pay | Admitting: Internal Medicine

## 2014-05-06 NOTE — Progress Notes (Addendum)
History of Present Illness: Jason Russell is a 64 y.o. male who returns for follow up after recent heart cath. He has a hx of HTN, sleep apnea, parox AFib, systolic CHF and dilated CM.  He was converted to NSR with Amiodarone (not a Tikosyn candidate with prolonged QT).  EF was thought to down due to tachycardia in rapid AFib.  Echo 7/13:  EF 25%.  Follow up echo 04/2012:  Mild LVH, EF 30-35%, Gr 1 DD, mild AI, mild MR, mild LAE.  Without full recovery of LVF, he was referred for cardiac cath.  LHC 07/15/12:  No obstructive CAD.  He has a NICM.   I saw the patinet in March 2015  He was seen by Tommas Olp in July    Patient reports he is busy caring for wife who is developing dementia  Wt Readings from Last 3 Encounters:  11/05/13 288 lb 3.2 oz (130.727 kg)  10/29/13 288 lb (130.636 kg)  10/18/13 288 lb 1.6 oz (130.681 kg)     Past Medical History  Diagnosis Date  . Hypertension   . Dyslipidemia   . Obesity   . Atrial fibrillation     Amiodarone started 10/2011; Coumadin  . Tobacco abuse   . COPD (chronic obstructive pulmonary disease)   . NICM (nonischemic cardiomyopathy)     LHC 4/14:  minimal CAD  . Chronic systolic heart failure     a. Echo 7/13: EF 25%;  b. echo 04/2012:  Mild LVH, EF 30-35%, Gr 1 DD, mild AI, mild MR, mild LAE  . 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 St. Jude ICD implantation 10/03/12.  . Chronic anticoagulation   . Asthma     "when I was a child"  . OSA on CPAP   . Anemia   . History of blood transfusion 10/15/2013    "don't know where the blood's going; HgB down to 5"  . Arthritis     "hx right hip"  . Gout     Current Outpatient Prescriptions  Medication Sig Dispense Refill  . allopurinol (ZYLOPRIM) 300 MG tablet Take 300 mg by mouth 2 (two) times daily.     [name] amiodarone (PACERONE) 200 MG tablet Take 200 mg by mouth daily.    [name] amLODipine (NORVASC) 5 MG tablet Take 5 mg by mouth daily.    [name] amLODipine (NORVASC)  5 MG tablet TAKE 1 TABLET BY MOUTH EVERY DAY 90 tablet 1  . atorvastatin (LIPITOR) 20 MG tablet TAKE 1 TABLET BY MOUTH EVERY DAY 90 tablet 0  . buPROPion (WELLBUTRIN SR) 150 MG 12 hr tablet Take 150 mg by mouth 2 (two) times daily.    . carvedilol (COREG) 25 MG tablet Take 25 mg by mouth 2 (two) times daily with a meal.    . ferrous gluconate (FERGON) 324 MG tablet TAKE 1 TABLET BY MOUTH 3 TIMES A DAY WITH MEALS 30 tablet 0  . hydrALAZINE (APRESOLINE) 50 MG tablet TAKE 1 TABLET BY MOUTH 3 TIMES DAILY. 270 tablet 1  . KLOR-CON M20 20 MEQ tablet TAKE 1 TABLET BY MOUTH EVERY DAY 30 tablet 3  . lisinopril (PRINIVIL,ZESTRIL) 40 MG tablet Take 40 mg by mouth daily.    [name] lisinopril (PRINIVIL,ZESTRIL) 40 MG tablet TAKE 1 TABLET BY MOUTH EVERY DAY 90 tablet 0  . niacin (NIASPAN) 1000 MG CR tablet TAKE 1 TABLET BY MOUTH AT BEDTIME 90 tablet 0  . spironolactone (ALDACTONE) 25 MG tablet Take 12.5  mg by mouth daily.    [name] spironolactone (ALDACTONE) 25 MG tablet TAKE 1/2 TABLET BY MOUTH DAILY. 45 tablet 1  . VIAGRA 100 MG tablet Take 100 mg by mouth daily as needed for erectile dysfunction. As directed    . XARELTO 20 MG TABS tablet TAKE 1 TABLET (20 MG TOTAL) BY MOUTH DAILY. 90 tablet 1   No current facility-administered medications for this visit.    Allergies:   No Known Allergies  Social History:  The patient  reports that he has been smoking Cigarettes.  He has a 11.25 pack-year smoking history. He has never used smokeless tobacco. He reports that he drinks about 0.6 oz of alcohol per week. He reports that he does not use illicit drugs.   ROS:  Please see the history of present illness.    All other systems reviewed and negative.   PHYSICAL EXAM: VS:  There were no vitals taken for this visit. Patient is a morbidly obese 64yo  in no acute distress HEENT: normal Neck: no JVD Cardiac:  normal S1, S2; RRR; no murmur Lungs:  clear to auscultation bilaterally, very minimal wheeze   rhonchi or  rales Abd: soft, nontender, no hepatomegaly Ext: no edema  Skin: warm and dry Neuro:  CNs 2-12 intact, no focal abnormalities noted   EKG  SR 82 bpm  LVH     ASSESSMENT AND PLAN:  1. NICM:  No angiographic CAD on cath. Volume status looks good  I would keep on same regimen  CHeck labs 2. Atrial Fibrillation:  Maintaining NSR.  Continue Xarelto  .  Note resting HR is 87  Will review with EP taper 3. Hypertension:  Well controlled on current rx  4. Hyperlipidemia:  Continue statin.  5.  Anemia  GI work up negative   Will check CBC today

## 2014-05-07 ENCOUNTER — Encounter: Payer: Self-pay | Admitting: Internal Medicine

## 2014-05-07 ENCOUNTER — Ambulatory Visit (INDEPENDENT_AMBULATORY_CARE_PROVIDER_SITE_OTHER): Payer: No Typology Code available for payment source | Admitting: Internal Medicine

## 2014-05-07 VITALS — BP 120/78 | HR 89 | Ht 73.0 in | Wt 302.0 lb

## 2014-05-07 DIAGNOSIS — I480 Paroxysmal atrial fibrillation: Secondary | ICD-10-CM

## 2014-05-07 MED ORDER — SILDENAFIL CITRATE 100 MG PO TABS: 100.0000 mg | ORAL_TABLET | Freq: Every day | ORAL | Status: DC | PRN

## 2014-05-07 NOTE — Patient Instructions (Addendum)
Your physician recommends that you have lab work today: CBC/ CMET/ TSH  Your physician has recommended you make the following change in your medication:  1) Start Clotrimazole 1% ointment- apply twice daily as needed  Your physician wants you to follow-up in: 6 months with Dr. Harrington Challenger. You will receive a reminder letter in the mail two months in advance. If you don't receive a letter, please call our office to schedule the follow-up appointment.

## 2014-05-08 LAB — CBC WITH DIFFERENTIAL/PLATELET
BASOS ABS: 0 10*3/uL (ref 0.0–0.1)
Basophils Relative: 0.2 % (ref 0.0–3.0)
EOS ABS: 0.2 10*3/uL (ref 0.0–0.7)
Eosinophils Relative: 3.1 % (ref 0.0–5.0)
HEMATOCRIT: 37.1 % — AB (ref 39.0–52.0)
Hemoglobin: 12.5 g/dL — ABNORMAL LOW (ref 13.0–17.0)
Lymphocytes Relative: 16.6 % (ref 12.0–46.0)
Lymphs Abs: 1 10*3/uL (ref 0.7–4.0)
MCHC: 33.6 g/dL (ref 30.0–36.0)
MCV: 93.2 fl (ref 78.0–100.0)
MONO ABS: 0.9 10*3/uL (ref 0.1–1.0)
Monocytes Relative: 13.8 % — ABNORMAL HIGH (ref 3.0–12.0)
Neutro Abs: 4.1 10*3/uL (ref 1.4–7.7)
Neutrophils Relative %: 66.3 % (ref 43.0–77.0)
Platelets: 173 10*3/uL (ref 150.0–400.0)
RBC: 3.98 Mil/uL — AB (ref 4.22–5.81)
RDW: 15.9 % — AB (ref 11.5–15.5)
WBC: 6.2 10*3/uL (ref 4.0–10.5)

## 2014-05-08 LAB — COMPREHENSIVE METABOLIC PANEL
ALT: 22 U/L (ref 0–53)
AST: 22 U/L (ref 0–37)
Albumin: 3.9 g/dL (ref 3.5–5.2)
Alkaline Phosphatase: 125 U/L — ABNORMAL HIGH (ref 39–117)
BUN: 20 mg/dL (ref 6–23)
CO2: 28 mEq/L (ref 19–32)
Calcium: 9.3 mg/dL (ref 8.4–10.5)
Chloride: 108 mEq/L (ref 96–112)
Creatinine, Ser: 1.36 mg/dL (ref 0.40–1.50)
GFR: 68.17 mL/min (ref >60.00)
Glucose, Bld: 83 mg/dL (ref 70–99)
Potassium: 4.1 mEq/L (ref 3.5–5.1)
SODIUM: 141 meq/L (ref 135–145)
Total Bilirubin: 0.4 mg/dL (ref 0.2–1.2)
Total Protein: 7 g/dL (ref 6.0–8.3)

## 2014-05-08 LAB — TSH: TSH: 12.16 u[IU]/mL — ABNORMAL HIGH (ref 0.35–4.50)

## 2014-05-11 ENCOUNTER — Other Ambulatory Visit: Payer: Self-pay | Admitting: Internal Medicine

## 2014-05-11 ENCOUNTER — Other Ambulatory Visit: Payer: Self-pay

## 2014-05-11 ENCOUNTER — Telehealth: Payer: Self-pay

## 2014-05-11 DIAGNOSIS — E039 Hypothyroidism, unspecified: Secondary | ICD-10-CM

## 2014-05-11 MED ORDER — AMIODARONE HCL 100 MG PO TABS: 100.0000 mg | ORAL_TABLET | Freq: Every day | ORAL | Status: DC

## 2014-05-11 MED ORDER — LEVOTHYROXINE SODIUM 50 MCG PO TABS: 50.0000 ug | ORAL_TABLET | Freq: Every day | ORAL | Status: DC

## 2014-05-11 NOTE — Telephone Encounter (Signed)
Please set for generic viagra  30   With 1 refill

## 2014-05-11 NOTE — Telephone Encounter (Signed)
Called patient about his lab results. Ordered patient's medication changes and sent to his pharmacy. Ordered and scheduled labs for TSH, T3, and T4 on 07/07/14. Patient was wanting to know if he could get a prescription of the generic brand of Viagra ordered. Will forward to Dr. Harrington Challenger for order.

## 2014-05-12 ENCOUNTER — Other Ambulatory Visit: Payer: Self-pay | Admitting: Internal Medicine

## 2014-05-13 ENCOUNTER — Other Ambulatory Visit: Payer: Self-pay

## 2014-05-13 MED ORDER — SILDENAFIL CITRATE 100 MG PO TABS: 100.0000 mg | ORAL_TABLET | Freq: Every day | ORAL | Status: DC | PRN

## 2014-05-13 NOTE — Telephone Encounter (Signed)
Ordered generic Viagra 100 mg, to replace previous order of Viagra 100 mg for patient, 30 with 1 refill. Patient is aware.

## 2014-05-14 ENCOUNTER — Telehealth: Payer: Self-pay

## 2014-05-14 NOTE — Telephone Encounter (Signed)
In reference to notes on 05/11/14, will need clarification for dosage for generic Viagra, the dose was 100 mg. Patient states that insurance will only cover Viagra 20 mg and the pharmacy he uses does not have generic Viagra. Will forward to Dr. Harrington Challenger for clarification.

## 2014-05-18 NOTE — Telephone Encounter (Signed)
I am confused. Was he using 100 mg before?  Was in generic>

## 2014-05-19 ENCOUNTER — Telehealth: Payer: Self-pay | Admitting: Internal Medicine

## 2014-05-19 NOTE — Telephone Encounter (Signed)
Patient states his Rx for Xarelto is over $400 per month. He can not afford that and he would like to know if there are any other options for him besides Xarelto. He took his last dose day before yesterday. Discussed with him the "caution" against going off of Xarelto abruptly. Will provide him with a month supply of Xarelto samples until Dr. Harrington Challenger can review and advise him accordingly. Patient verbalized understanding and appreciation and will come now to the office to pick up the samples. Routed message/request to Dr. Harrington Challenger.

## 2014-05-19 NOTE — Telephone Encounter (Signed)
New message       Pt cannot afford xarelto.  Is there something else he can get?

## 2014-05-20 NOTE — Telephone Encounter (Signed)
He was originally ordered Viagra (non-generic) 100 mg tablet on 10/28/12.

## 2014-05-21 MED ORDER — SILDENAFIL CITRATE 50 MG PO TABS: 50.0000 mg | ORAL_TABLET | Freq: Every day | ORAL | Status: DC | PRN

## 2014-05-21 NOTE — Telephone Encounter (Signed)
Informed patient. He will discuss with the pharmacy to get the reduced rate. Patient would like generic and 50 mg because he was breaking the 100 mg in 1/2.

## 2014-05-21 NOTE — Telephone Encounter (Signed)
Called patient's insurance plan. 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

## 2014-05-21 NOTE — Telephone Encounter (Signed)
Called patient's insurance plan Was transferred to [facility] to discuss alternatives for xarelto. Was informed that they due to the type of pharmacy plan he has, his coverage for these agents will be low. He will have co pay of $436 for one month of Xarelto; or $151 for one month of Pradaxa.  Spoke with Elberta Leatherwood, PharmD. Offered patient the co pay card to try at [facility]. He requests that I mail it to him, since he currently has samples. Explained what insurance company told me.  Instructed him to call back if co pay card doesn't work. Per Dr. Harrington Challenger she will change him to a different anticoagulation. Patient verbalizes understanding.

## 2014-05-21 NOTE — Telephone Encounter (Signed)
Generic should be OK if that is what insurance will fill

## 2014-05-21 NOTE — Telephone Encounter (Signed)
Check if insured  ? What is covered

## 2014-05-28 ENCOUNTER — Encounter: Payer: Self-pay | Admitting: *Deleted

## 2014-06-05 ENCOUNTER — Other Ambulatory Visit: Payer: Self-pay | Admitting: Internal Medicine

## 2014-06-06 ENCOUNTER — Other Ambulatory Visit: Payer: Self-pay | Admitting: Internal Medicine

## 2014-06-08 ENCOUNTER — Encounter: Payer: Self-pay | Admitting: Internal Medicine

## 2014-07-02 ENCOUNTER — Encounter: Payer: Self-pay | Admitting: *Deleted

## 2014-07-06 ENCOUNTER — Other Ambulatory Visit: Payer: Self-pay | Admitting: Internal Medicine

## 2014-07-07 ENCOUNTER — Other Ambulatory Visit (INDEPENDENT_AMBULATORY_CARE_PROVIDER_SITE_OTHER): Payer: No Typology Code available for payment source | Admitting: *Deleted

## 2014-07-07 ENCOUNTER — Other Ambulatory Visit: Payer: Self-pay | Admitting: Internal Medicine

## 2014-07-07 DIAGNOSIS — E039 Hypothyroidism, unspecified: Secondary | ICD-10-CM | POA: Diagnosis not present

## 2014-07-07 LAB — TSH: TSH: 3.02 u[IU]/mL (ref 0.35–4.50)

## 2014-07-07 LAB — T3, FREE: T3, Free: 3 pg/mL (ref 2.3–4.2)

## 2014-07-07 LAB — T4, FREE: Free T4: 1.09 ng/dL (ref 0.60–1.60)

## 2014-07-07 NOTE — Addendum Note (Signed)
Addended by: Eulis Foster on: 07/07/2014 07:46 AM   Modules accepted: Orders

## 2014-07-08 ENCOUNTER — Other Ambulatory Visit: Payer: Self-pay | Admitting: Internal Medicine

## 2014-07-16 ENCOUNTER — Other Ambulatory Visit: Payer: Self-pay | Admitting: Internal Medicine

## 2014-07-28 ENCOUNTER — Other Ambulatory Visit: Payer: Self-pay | Admitting: Internal Medicine

## 2014-07-29 ENCOUNTER — Encounter: Payer: Self-pay | Admitting: *Deleted

## 2014-08-01 ENCOUNTER — Emergency Department (HOSPITAL_COMMUNITY)
Admission: EM | Admit: 2014-08-01 | Discharge: 2014-08-01 | Disposition: A | Discharge status: Home / Self Care | Attending: Emergency Medicine | Admitting: Emergency Medicine

## 2014-08-01 ENCOUNTER — Emergency Department (HOSPITAL_COMMUNITY)

## 2014-08-01 ENCOUNTER — Encounter (HOSPITAL_COMMUNITY): Payer: Self-pay | Admitting: *Deleted

## 2014-08-01 DIAGNOSIS — I5022 Chronic systolic (congestive) heart failure: Secondary | ICD-10-CM | POA: Insufficient documentation

## 2014-08-01 DIAGNOSIS — Y939 Activity, unspecified: Secondary | ICD-10-CM | POA: Insufficient documentation

## 2014-08-01 DIAGNOSIS — Z79899 Other long term (current) drug therapy: Secondary | ICD-10-CM | POA: Insufficient documentation

## 2014-08-01 DIAGNOSIS — Z7901 Long term (current) use of anticoagulants: Secondary | ICD-10-CM | POA: Diagnosis not present

## 2014-08-01 DIAGNOSIS — Z9981 Dependence on supplemental oxygen: Secondary | ICD-10-CM | POA: Diagnosis not present

## 2014-08-01 DIAGNOSIS — Y92242 Post office as the place of occurrence of the external cause: Secondary | ICD-10-CM | POA: Diagnosis not present

## 2014-08-01 DIAGNOSIS — I4891 Unspecified atrial fibrillation: Secondary | ICD-10-CM | POA: Diagnosis not present

## 2014-08-01 DIAGNOSIS — E669 Obesity, unspecified: Secondary | ICD-10-CM | POA: Diagnosis not present

## 2014-08-01 DIAGNOSIS — Z95810 Presence of automatic (implantable) cardiac defibrillator: Secondary | ICD-10-CM | POA: Diagnosis not present

## 2014-08-01 DIAGNOSIS — E785 Hyperlipidemia, unspecified: Secondary | ICD-10-CM | POA: Insufficient documentation

## 2014-08-01 DIAGNOSIS — D649 Anemia, unspecified: Secondary | ICD-10-CM | POA: Diagnosis not present

## 2014-08-01 DIAGNOSIS — J45909 Unspecified asthma, uncomplicated: Secondary | ICD-10-CM | POA: Insufficient documentation

## 2014-08-01 DIAGNOSIS — W2209XA Striking against other stationary object, initial encounter: Secondary | ICD-10-CM | POA: Insufficient documentation

## 2014-08-01 DIAGNOSIS — Z720 Tobacco use: Secondary | ICD-10-CM | POA: Diagnosis not present

## 2014-08-01 DIAGNOSIS — S8012XA Contusion of left lower leg, initial encounter: Secondary | ICD-10-CM | POA: Diagnosis not present

## 2014-08-01 DIAGNOSIS — I10 Essential (primary) hypertension: Secondary | ICD-10-CM | POA: Diagnosis not present

## 2014-08-01 DIAGNOSIS — M109 Gout, unspecified: Secondary | ICD-10-CM | POA: Insufficient documentation

## 2014-08-01 DIAGNOSIS — J449 Chronic obstructive pulmonary disease, unspecified: Secondary | ICD-10-CM | POA: Diagnosis not present

## 2014-08-01 DIAGNOSIS — Y990 Civilian activity done for income or pay: Secondary | ICD-10-CM | POA: Diagnosis not present

## 2014-08-01 DIAGNOSIS — S8992XA Unspecified injury of left lower leg, initial encounter: Secondary | ICD-10-CM | POA: Diagnosis present

## 2014-08-01 DIAGNOSIS — G4733 Obstructive sleep apnea (adult) (pediatric): Secondary | ICD-10-CM | POA: Insufficient documentation

## 2014-08-01 NOTE — ED Notes (Addendum)
Pt states that he hit his leg while at work on Thursday. Pt noted to have swelling and bruising to left leg. Pt is on xerelto. Pt ambulatory at triage

## 2014-08-01 NOTE — Discharge Instructions (Signed)
Take tylenol, motrin for pain.   Elevate your legs.   Use compression stockings.   See your doctor.   Return to ER if you have severe pain, leg numbness, unable to walk.

## 2014-08-01 NOTE — ED Provider Notes (Signed)
CSN: [id number]     Arrival date & time 08/01/14  1710 History   First MD Initiated Contact with Patient 08/01/14 1826     Chief Complaint  Patient presents with  . Leg Pain     (Consider location/radiation/quality/duration/timing/severity/associated sxs/prior Treatment) The history is provided by the patient.  Jason Russell is a 64 y.o. male hx of HTN, HL, afib on xarelto here with left leg injury. 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 He states that he accidentally kicked a piece of metal on the left leg 2 days ago. He then has increasing bruising and pain in the left leg. Denies any falls. Denies any numbness or weakness of the leg.    Past Medical History  Diagnosis Date  . Hypertension   . Dyslipidemia   . Obesity   . Atrial fibrillation     Amiodarone started 10/2011; Coumadin  . Tobacco abuse   . COPD (chronic obstructive pulmonary disease)   . NICM (nonischemic cardiomyopathy)     LHC 4/14:  minimal CAD  . Chronic systolic heart failure     a. Echo 7/13: EF 25%;  b. echo 04/2012:  Mild LVH, EF 30-35%, Gr 1 DD, mild AI, mild MR, mild LAE  . 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 St. Jude ICD implantation 10/03/12.  . Chronic anticoagulation   . Asthma     "when I was a child"  . OSA on CPAP   . Anemia   . History of blood transfusion 10/15/2013    "don't know where the blood's going; HgB down to 5"  . Arthritis     "hx right hip"  . Gout    Past Surgical History  Procedure Laterality Date  . Total hip arthroplasty Right 11/25/1997  . Cardioversion  2011  . Tonsillectomy  1950's  . Cardiac defibrillator placement  2014  . Joint replacement    . Colonoscopy with propofol Left 10/17/2013    Procedure: COLONOSCOPY WITH PROPOFOL;  Surgeon: Inda Castle, MD;  Location: [place];  Service: Endoscopy;  Laterality: Left;  . Esophagogastroduodenoscopy N/A 10/17/2013    Procedure: ESOPHAGOGASTRODUODENOSCOPY (EGD);   Surgeon: Inda Castle, MD;  Location: [place];  Service: Endoscopy;  Laterality: N/A;  . Givens capsule study N/A 10/29/2013    Procedure: GIVENS CAPSULE STUDY;  Surgeon: Inda Castle, MD;  Location: [facility];  Service: Endoscopy;  Laterality: N/A;  . Implantable cardioverter defibrillator implant Left 10/03/2012    Procedure: IMPLANTABLE CARDIOVERTER DEFIBRILLATOR IMPLANT;  Surgeon: Deboraha Sprang, MD;  Location: [facility] CATH LAB;  Service: Cardiovascular;  Laterality: Left;   Family History  Problem Relation Age of Onset  . Other Father     deceased- unknow reason  . Hypertension Mother   . Lung cancer Brother    History  Substance Use Topics  . Smoking status: Current Every Day Smoker -- 0.25 packs/day for 45 years    Types: Cigarettes  . Smokeless tobacco: Never Used  . Alcohol Use: 0.6 oz/week    1 Cans of beer per week    Review of Systems  Musculoskeletal:       L leg pain   All other systems reviewed and are negative.     Allergies  Review of patient's allergies indicates no known allergies.  Home Medications   Prior to Admission medications   Medication Sig Start Date End Date Taking? Authorizing Provider  allopurinol (ZYLOPRIM) 300 MG tablet  Take 300 mg by mouth 2 (two) times daily.  12/18/10   Historical Provider, MD  amiodarone (PACERONE) 100 MG tablet Take 1 tablet (100 mg total) by mouth daily. 05/11/14   Fay Records, MD  amLODipine (NORVASC) 5 MG tablet Take 5 mg by mouth daily.    Historical Provider, MD  amLODipine (NORVASC) 5 MG tablet TAKE 1 TABLET BY MOUTH EVERY DAY 07/16/14   Fay Records, MD  atorvastatin (LIPITOR) 20 MG tablet TAKE 1 TABLET BY MOUTH EVERY DAY 05/14/14   Fay Records, MD  buPROPion [facility] SR) 150 MG 12 hr tablet Take 150 mg by mouth 2 (two) times daily.    Historical Provider, MD  Clotrimazole 1 % OINT Apply to affected area twice daily as needed 05/07/14   Fay Records, MD  ferrous gluconate (FERGON) 324 MG tablet TAKE 1 TABLET  BY MOUTH 3 TIMES A DAY WITH MEALS 06/05/14   Fay Records, MD  hydrALAZINE (APRESOLINE) 50 MG tablet TAKE 1 TABLET BY MOUTH 3 TIMES DAILY. 04/02/14   Fay Records, MD  KLOR-CON M20 20 MEQ tablet TAKE 1 TABLET BY MOUTH EVERY DAY 03/16/14   Fay Records, MD  KLOR-CON M20 20 MEQ tablet TAKE 1 TABLET BY MOUTH EVERY DAY 06/08/14   Fay Records, MD  levothyroxine (SYNTHROID) 50 MCG tablet Take 1 tablet (50 mcg total) by mouth daily before breakfast. 05/11/14   Fay Records, MD  lisinopril (PRINIVIL,ZESTRIL) 40 MG tablet TAKE 1 TABLET BY MOUTH EVERY DAY 07/07/14   Fay Records, MD  niacin (NIASPAN) 1000 MG CR tablet TAKE 1 TABLET BY MOUTH AT BEDTIME 05/12/14   Fay Records, MD  sildenafil (VIAGRA) 50 MG tablet Take 1 tablet (50 mg total) by mouth daily as needed for erectile dysfunction. 05/21/14   Fay Records, MD  spironolactone (ALDACTONE) 25 MG tablet Take 12.5 mg by mouth daily.    Historical Provider, MD  spironolactone (ALDACTONE) 25 MG tablet TAKE 1/2 TABLET BY MOUTH DAILY. 07/08/14   Fay Records, MD  XARELTO 20 MG TABS tablet TAKE 1 TABLET (20 MG TOTAL) BY MOUTH DAILY. 02/05/14   Fay Records, MD   BP 133/78 mmHg  Pulse 94  Temp(Src) 98 F (36.7 C) (Oral)  Resp 16  SpO2 98% Physical Exam  Constitutional: He is oriented to person, place, and time. He appears well-developed.  HENT:  Head: Normocephalic.  Mouth/Throat: Oropharynx is clear and moist.  Eyes: Conjunctivae are normal. Pupils are equal, round, and reactive to light.  Neck: Normal range of motion. Neck supple.  Cardiovascular: Normal rate, regular rhythm and normal heart sounds.   Pulmonary/Chest: Effort normal and breath sounds normal. No respiratory distress. He has no wheezes.  Abdominal: Soft. Bowel sounds are normal. He exhibits no distension. There is no tenderness.  Musculoskeletal:  L lower leg with lateral hematoma. No bony tenderness. 2+ pulses. Compartments soft. No laceration   Neurological: He is alert and oriented to person,  place, and time.  Skin: Skin is warm and dry.  Psychiatric: He has a normal mood and affect. His behavior is normal. Judgment and thought content normal.  Nursing note and vitals reviewed.   ED Course  Procedures (including critical care time) Labs Review Labs Reviewed - No data to display  Imaging Review Dg Tibia/fibula Left  08/01/2014   CLINICAL DATA:  Struck leg on tractor at work 3 days ago, redness/swelling, distal fibular pain  EXAM: LEFT TIBIA  AND FIBULA - 2 VIEW  COMPARISON:  None.  FINDINGS: No fracture or dislocation is seen.  Mild degenerative changes at the knee and ankle.  Soft tissue calcifications, predominantly overlying the distal tibia/fibula.  Soft tissue swelling at the ankle.  No radiopaque foreign body is seen.  IMPRESSION: Soft tissue swelling at the ankle.  No radiopaque foreign body is seen.  No acute osseus abnormality is seen.   Electronically Signed   By: Julian Hy M.D.   On: 08/01/2014 18:39     EKG Interpretation None      MDM   Final diagnoses:  None   Jason Russell is a 64 y.o. male here with L leg hematoma after injury. Xray showed no fracture. Not concerned with compartment syndrome. Recommend leg elevation, compression stockings.      Wandra Arthurs, MD 08/01/14 [phone]

## 2014-08-04 ENCOUNTER — Other Ambulatory Visit: Payer: Self-pay | Admitting: Internal Medicine

## 2014-08-12 ENCOUNTER — Telehealth: Payer: Self-pay | Admitting: Internal Medicine

## 2014-08-12 NOTE — Telephone Encounter (Signed)
Pt called to see if Dr. Harrington Challenger can referrer him to the Wound center in [place]. Pt said that he had a left leg ulcer and his PCP referred him there in 2006. The ulcer healed. Now for about 3 to 4 days the ulcer site is painful and he would like to be seen before the ulcer opens. Pt states that his PCP since has retired and he does not have a PCP now. Pt is aware that this message will be send to MD for reviewing.

## 2014-08-12 NOTE — Telephone Encounter (Signed)
New message       Pt want to know if Dr Harrington Challenger would refer him to the wound center for let ulcers.  He went there several years ago.

## 2014-08-18 NOTE — Telephone Encounter (Signed)
Faxed demographic sheet to [place] [facility]. Patient is aware of referral.

## 2014-08-18 NOTE — Telephone Encounter (Signed)
Spoke to someone at [place] at [facility] They recomm that we fax demographic sheet for Jason Russell over to them Fax number [phone]. Let pt know that referral was sent as well

## 2014-08-26 ENCOUNTER — Other Ambulatory Visit: Payer: Self-pay | Admitting: Internal Medicine

## 2014-08-30 NOTE — Telephone Encounter (Signed)
OK to refill

## 2014-09-09 ENCOUNTER — Other Ambulatory Visit: Payer: Self-pay | Admitting: Internal Medicine

## 2014-09-11 ENCOUNTER — Ambulatory Visit (INDEPENDENT_AMBULATORY_CARE_PROVIDER_SITE_OTHER): Payer: No Typology Code available for payment source | Admitting: *Deleted

## 2014-09-11 DIAGNOSIS — I429 Cardiomyopathy, unspecified: Secondary | ICD-10-CM

## 2014-09-11 DIAGNOSIS — I5022 Chronic systolic (congestive) heart failure: Secondary | ICD-10-CM

## 2014-09-11 DIAGNOSIS — I428 Other cardiomyopathies: Secondary | ICD-10-CM

## 2014-09-11 LAB — CUP PACEART INCLINIC DEVICE CHECK
Battery Remaining Longevity: 85.2 mo
HighPow Impedance: 39.6042
Lead Channel Impedance Value: 437.5 Ohm
Lead Channel Pacing Threshold Amplitude: 0.75 V
Lead Channel Pacing Threshold Amplitude: 0.75 V
Lead Channel Pacing Threshold Pulse Width: 0.5 ms
Lead Channel Setting Pacing Amplitude: 2.5 V
MDC IDC MSMT LEADCHNL RV PACING THRESHOLD PULSEWIDTH: 0.5 ms
MDC IDC MSMT LEADCHNL RV SENSING INTR AMPL: 11.7 mV
MDC IDC PG SERIAL: 1105031
MDC IDC SESS DTM: 20160610123203
MDC IDC SET LEADCHNL RV PACING PULSEWIDTH: 0.5 ms
MDC IDC SET LEADCHNL RV SENSING SENSITIVITY: 0.5 mV
MDC IDC SET ZONE DETECTION INTERVAL: 250 ms
MDC IDC STAT BRADY RV PERCENT PACED: 0 %
Zone Setting Detection Interval: 300 ms

## 2014-09-11 NOTE — Progress Notes (Signed)
ICD check in clinic. Normal device function. Threshold and sensing consistent with previous device measurements. Impedance trends stable over time. (1) ATP treated "ventricular arrhythmia" from 05/17/14 x 15 sec @ 218bpm---AF/RVR per EGM. Histogram distribution appropriate for patient and level of activity. No changes made this session. Device programmed at appropriate safety margins. Device programmed to optimize intrinsic conduction. Estimated longevity 7.1 years. Pt enrolled in remote follow-up. Plan to follow up with SK on 9/13 @ 1345. Patient education completed including shock plan. Vibration demonstrated for patient.

## 2014-09-28 ENCOUNTER — Encounter: Payer: Self-pay | Admitting: Internal Medicine

## 2014-10-11 ENCOUNTER — Other Ambulatory Visit: Payer: Self-pay | Admitting: Internal Medicine

## 2014-10-12 ENCOUNTER — Other Ambulatory Visit: Payer: Self-pay

## 2014-10-12 MED ORDER — LISINOPRIL 40 MG PO TABS: 40.0000 mg | ORAL_TABLET | Freq: Every day | ORAL | Status: DC

## 2014-11-27 ENCOUNTER — Other Ambulatory Visit: Payer: Self-pay | Admitting: Internal Medicine

## 2014-12-15 ENCOUNTER — Ambulatory Visit (INDEPENDENT_AMBULATORY_CARE_PROVIDER_SITE_OTHER): Payer: No Typology Code available for payment source | Admitting: Internal Medicine

## 2014-12-15 ENCOUNTER — Encounter: Payer: Self-pay | Admitting: Internal Medicine

## 2014-12-15 VITALS — BP 138/80 | HR 91 | Ht 73.0 in | Wt 287.2 lb

## 2014-12-15 DIAGNOSIS — I5022 Chronic systolic (congestive) heart failure: Secondary | ICD-10-CM

## 2014-12-15 DIAGNOSIS — I429 Cardiomyopathy, unspecified: Secondary | ICD-10-CM | POA: Diagnosis not present

## 2014-12-15 DIAGNOSIS — I428 Other cardiomyopathies: Secondary | ICD-10-CM

## 2014-12-15 LAB — COMPREHENSIVE METABOLIC PANEL
ALBUMIN: 4 g/dL (ref 3.5–5.2)
ALK PHOS: 138 U/L — AB (ref 39–117)
ALT: 19 U/L (ref 0–53)
AST: 21 U/L (ref 0–37)
BUN: 28 mg/dL — ABNORMAL HIGH (ref 6–23)
CALCIUM: 9.6 mg/dL (ref 8.4–10.5)
CHLORIDE: 110 meq/L (ref 96–112)
CO2: 25 mEq/L (ref 19–32)
Creatinine, Ser: 1.34 mg/dL (ref 0.40–1.50)
GFR: 69.21 mL/min (ref >60.00)
Glucose, Bld: 92 mg/dL (ref 70–99)
POTASSIUM: 3.8 meq/L (ref 3.5–5.1)
Sodium: 141 mEq/L (ref 135–145)
TOTAL PROTEIN: 7 g/dL (ref 6.0–8.3)
Total Bilirubin: 0.4 mg/dL (ref 0.2–1.2)

## 2014-12-15 LAB — CUP PACEART INCLINIC DEVICE CHECK
Brady Statistic RV Percent Paced: 0 %
HIGH POWER IMPEDANCE MEASURED VALUE: 37 Ohm
Lead Channel Pacing Threshold Amplitude: 0.5 V
Lead Channel Pacing Threshold Pulse Width: 0.5 ms
Lead Channel Pacing Threshold Pulse Width: 0.5 ms
Lead Channel Setting Pacing Amplitude: 2.5 V
Lead Channel Setting Pacing Pulse Width: 0.5 ms
Lead Channel Setting Sensing Sensitivity: 0.5 mV
MDC IDC MSMT BATTERY REMAINING LONGEVITY: 80.4 mo
MDC IDC MSMT LEADCHNL RV IMPEDANCE VALUE: 400 Ohm
MDC IDC MSMT LEADCHNL RV PACING THRESHOLD AMPLITUDE: 0.5 V
MDC IDC MSMT LEADCHNL RV SENSING INTR AMPL: 11.6 mV
MDC IDC PG SERIAL: 1105031
MDC IDC SESS DTM: 20160913144906
MDC IDC SET ZONE DETECTION INTERVAL: 250 ms
Zone Setting Detection Interval: 300 ms

## 2014-12-15 LAB — TSH: TSH: 0.58 u[IU]/mL (ref 0.35–4.50)

## 2014-12-15 NOTE — Progress Notes (Signed)
Patient Care Team: [name], MD as PCP - General (Family Medicine) Dorris Carnes V, MD (Cardiology)   HPI  Jason Russell is a 64 y.o. male Seen in follow-up for an ICD implanted 2014 for primary prevention in the context of nonischemic heart disease. He also has a history of paroxysmal atrial fibrillation  The patient denies chest pain, shortness of breath, nocturnal dyspnea, orthopnea   Does have peripheral edema.  There have been no palpitations, lightheadedness or syncope.   Patient denies symptoms of GI intolerance, sun sensitivity, neurological symptoms attributable to amiodarone.  Surveillance laboratories were in normal limits when checked68month ago   Echocardiogram 7/13  25% Echocardiogram 1/14 35% Records and Results Reviewed*   Past Medical History  Diagnosis Date  . Hypertension   . Dyslipidemia   . Obesity   . Atrial fibrillation     Amiodarone started 10/2011; Coumadin  . Tobacco abuse   . COPD (chronic obstructive pulmonary disease)   . NICM (nonischemic cardiomyopathy)     LHC 4/14:  minimal CAD  . Chronic systolic heart failure     a. Echo 7/13: EF 25%;  b. echo 04/2012:  Mild LVH, EF 30-35%, Gr 1 DD, mild AI, mild MR, mild LAE  . 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 St. Jude ICD implantation 10/03/12.  . Chronic anticoagulation   . Asthma     "when I was a child"  . OSA on CPAP   . Anemia   . History of blood transfusion 10/15/2013    "don't know where the blood's going; HgB down to 5"  . Arthritis     "hx right hip"  . Gout     Past Surgical History  Procedure Laterality Date  . Total hip arthroplasty Right 11/25/1997  . Cardioversion  2011  . Tonsillectomy  1950's  . Cardiac defibrillator placement  2014  . Joint replacement    . Colonoscopy with propofol Left 10/17/2013    Procedure: COLONOSCOPY WITH PROPOFOL;  Surgeon: RInda Castle MD;  [place]  Service: Endoscopy;   Laterality: Left;  . Esophagogastroduodenoscopy N/A 10/17/2013    Procedure: ESOPHAGOGASTRODUODENOSCOPY (EGD);  Surgeon: RInda Castle MD;  [place]  Service: Endoscopy;  Laterality: N/A;  . Givens capsule study N/A 10/29/2013    Procedure: GIVENS CAPSULE STUDY;  Surgeon: RInda Castle MD;  Location: [facility];  Service: Endoscopy;  Laterality: N/A;  . Implantable cardioverter defibrillator implant Left 10/03/2012    Procedure: IMPLANTABLE CARDIOVERTER DEFIBRILLATOR IMPLANT;  Surgeon: SDeboraha Sprang MD;  [facility];  Service: Cardiovascular;  Laterality: Left;    Current Outpatient Prescriptions  Medication Sig Dispense Refill  . amiodarone (PACERONE) 100 MG tablet Take 1 tablet (100 mg total) by mouth daily. 90 tablet 3  . amLODipine (NORVASC) 5 MG tablet TAKE 1 TABLET BY MOUTH EVERY DAY 90 tablet 1  . atorvastatin (LIPITOR) 20 MG tablet TAKE 1 TABLET BY MOUTH EVERY DAY 90 tablet 3  . buPROPion (WELLBUTRIN SR) 150 MG 12 hr tablet TAKE 1 TABLET (150 MG TOTAL) BY MOUTH 2 (TWO) TIMES DAILY. 180 tablet 3  . Clotrimazole 1 % OINT Apply 1 application to affected area twice daily as directed    . colchicine 0.6 MG tablet Take 1 tablet by mouth daily.    . ferrous gluconate (FERGON) 324 MG tablet TAKE 1 TABLET BY MOUTH 3 TIMES A DAY WITH MEALS 90 tablet  5  . hydrALAZINE (APRESOLINE) 100 MG tablet Take 1 tablet by mouth 3 (three) times daily.  1  . KLOR-CON M20 20 MEQ tablet TAKE 1 TABLET BY MOUTH EVERY DAY 30 tablet 3  . levothyroxine (SYNTHROID) 50 MCG tablet Take 1 tablet (50 mcg total) by mouth daily before breakfast. 90 tablet 3  . lisinopril (PRINIVIL,ZESTRIL) 40 MG tablet Take 1 tablet (40 mg total) by mouth daily. 90 tablet 1  . niacin (NIASPAN) 1000 MG CR tablet TAKE 1 TABLET BY MOUTH AT BEDTIME 90 tablet 3  . sildenafil (VIAGRA) 50 MG tablet Take 1 tablet (50 mg total) by mouth daily as needed for erectile dysfunction. 10 tablet 0  . spironolactone  (ALDACTONE) 25 MG tablet TAKE 1/2 TABLET BY MOUTH DAILY. 45 tablet 1  . ULORIC 40 MG tablet Take 1 tablet by mouth daily.    . Vitamin D, Ergocalciferol, (DRISDOL) 50000 UNITS CAPS capsule Take 1 capsule by mouth once a week.  3  . XARELTO 20 MG TABS tablet TAKE 1 TABLET BY MOUTH DAILY. 90 tablet 0   No current facility-administered medications for this visit.    No Known Allergies    Review of Systems negative except from HPI and PMH  Physical Exam BP 138/80 mmHg  Pulse 91  Ht '6\' 1"'$  (1.854 m)  Wt 287 lb 3.2 oz (130.273 kg)  BMI 37.90 kg/m2 Well developed and well nourished in no acute distress HENT normal E scleral and icterus clear Neck Supple JVP flat; carotids brisk and full Clear to ausculation *Regular rate and rhythm, no murmurs gallops or rub Soft with active bowel sounds No clubbing cyanosis 2+ Edema Alert and oriented, grossly normal motor and sensory function Skin Warm and Dry  ECG  82 18/12/49  Assessment and  Plan  HTN  ICD The patient's device was interrogated.  The information was reviewed. No changes were made in the programming.     CHD systolic chronic  OBesity  NICM  Hyperlipidemia  Afib-proxysmal  OSA  CPAP  Patient's blood pressure is reasonably controlled.  In the wake of the new data, we will discontinue his niacin; we'll continue him on statins  No clear interval atrial fibrillation. He will remain on low-dose amiodarone and anticoagulation. We will check his amiodarone surveillance laboratories.  We will check his potassium level last checked 2/16 on Aldactone  He is to follow-up with his pulmonologist next month. He is to bring his CPAP machine with him. He has not been read recently.

## 2014-12-15 NOTE — Patient Instructions (Signed)
Medication Instructions: - Stop Niaspan  Labwork: - Your physician recommends that you have lab work today: CMET/ TSH  Procedures/Testing: - Your physician has requested that you have an echocardiogram. Echocardiography is a painless test that uses sound waves to create images of your heart. It provides your doctor with information about the size and shape of your heart and how well your heart's chambers and valves are working. This procedure takes approximately one hour. There are no restrictions for this procedure.   Follow-Up: - Remote monitoring is used to monitor your Pacemaker of ICD from home. This monitoring reduces the number of office visits required to check your device to one time per year. It allows [place] to keep an eye on the functioning of your device to ensure it is working properly. You are scheduled for a device check from home on 03/16/15. You may send your transmission at any time that day. If you have a wireless device, the transmission will be sent automatically. After your physician reviews your transmission, you will receive a postcard with your next transmission date.  - Your physician wants you to follow-up in: 1 year with Dr. Caryl Comes. You will receive a reminder letter in the mail two months in advance. If you don't receive a letter, please call our office to schedule the follow-up appointment.  Any Additional Special Instructions Will Be Listed Below (If Applicable).

## 2014-12-23 ENCOUNTER — Other Ambulatory Visit: Payer: Self-pay | Admitting: Internal Medicine

## 2014-12-25 ENCOUNTER — Other Ambulatory Visit: Payer: Self-pay | Admitting: Internal Medicine

## 2014-12-25 NOTE — Telephone Encounter (Signed)
Should the patient be taking amiodarone '200mg'$  as requested or amiodarone '100mg'$  as it was ordered on 05/11/14? I don't see any documentation that his dose was to be reduced to '100mg'$ . Please advise. Thanks, MI

## 2014-12-28 ENCOUNTER — Other Ambulatory Visit: Payer: Self-pay

## 2014-12-28 ENCOUNTER — Ambulatory Visit (HOSPITAL_COMMUNITY): Payer: No Typology Code available for payment source | Attending: Internal Medicine

## 2014-12-28 DIAGNOSIS — I253 Aneurysm of heart: Secondary | ICD-10-CM | POA: Insufficient documentation

## 2014-12-28 DIAGNOSIS — I517 Cardiomegaly: Secondary | ICD-10-CM | POA: Diagnosis not present

## 2014-12-28 DIAGNOSIS — F17200 Nicotine dependence, unspecified, uncomplicated: Secondary | ICD-10-CM | POA: Insufficient documentation

## 2014-12-28 DIAGNOSIS — I351 Nonrheumatic aortic (valve) insufficiency: Secondary | ICD-10-CM | POA: Diagnosis not present

## 2014-12-28 DIAGNOSIS — I429 Cardiomyopathy, unspecified: Secondary | ICD-10-CM | POA: Diagnosis not present

## 2014-12-28 DIAGNOSIS — I5022 Chronic systolic (congestive) heart failure: Secondary | ICD-10-CM | POA: Insufficient documentation

## 2014-12-28 DIAGNOSIS — I10 Essential (primary) hypertension: Secondary | ICD-10-CM | POA: Diagnosis not present

## 2014-12-28 DIAGNOSIS — I428 Other cardiomyopathies: Secondary | ICD-10-CM

## 2014-12-28 DIAGNOSIS — Z6837 Body mass index (BMI) 37.0-37.9, adult: Secondary | ICD-10-CM | POA: Insufficient documentation

## 2015-01-07 ENCOUNTER — Telehealth: Payer: Self-pay

## 2015-01-07 ENCOUNTER — Other Ambulatory Visit: Payer: Self-pay

## 2015-01-07 NOTE — Telephone Encounter (Signed)
-----   Message from Fay Records, MD sent at 01/01/2015  9:56 PM EDT ----- When i saw pt last he was on these meds. I am confused  What is he taking? ----- Message -----    From: Deboraha Sprang, MD    Sent: 12/31/2014   8:28 AM      To: Fay Records, MD, Emily Filbert, RN  Please Inform Patient Stable EF by  Echo   Nevin Bloodgood noticed taht he is not on ACE or BB  Any reason??? thjanks

## 2015-01-07 NOTE — Telephone Encounter (Signed)
Informed patient of results and verbal understanding expressed.  Reviewed medications with patient in great detail and med list updated. Patient is on lisinopril but no BB. See med list.

## 2015-01-18 ENCOUNTER — Telehealth: Payer: Self-pay | Admitting: *Deleted

## 2015-01-18 MED ORDER — CARVEDILOL 6.25 MG PO TABS: 6.2500 mg | ORAL_TABLET | Freq: Two times a day (BID) | ORAL | Status: DC

## 2015-01-18 NOTE — Telephone Encounter (Signed)
Notes Recorded by Rodman Key, RN on 01/18/2015 at 5:43 PM Reviewed meds again w/ pt. Not taking Coreg. Order placed. Pt will start COREG 6.25 BID Appointment made with Dr. Harrington Challenger 03/05/15. Notes Recorded by Fay Records, MD on 01/11/2015 at 9:25 AM When I saw him last winter he had been on Coreg. I am not sure why this was stopped Please confirm with pt. BP and heart rate were OK on visit to [facility] If he is truly of could try again at lower dose to start Say 6/25 bid Please make sure then that he has f/u with me in about 6 wks.

## 2015-01-23 ENCOUNTER — Other Ambulatory Visit: Payer: Self-pay | Admitting: Internal Medicine

## 2015-02-18 ENCOUNTER — Other Ambulatory Visit: Payer: Self-pay | Admitting: Internal Medicine

## 2015-02-21 ENCOUNTER — Other Ambulatory Visit: Payer: Self-pay | Admitting: Internal Medicine

## 2015-03-02 ENCOUNTER — Other Ambulatory Visit: Payer: Self-pay | Admitting: Internal Medicine

## 2015-03-05 ENCOUNTER — Ambulatory Visit (INDEPENDENT_AMBULATORY_CARE_PROVIDER_SITE_OTHER): Payer: No Typology Code available for payment source | Admitting: Internal Medicine

## 2015-03-05 ENCOUNTER — Encounter: Payer: Self-pay | Admitting: Internal Medicine

## 2015-03-05 VITALS — BP 138/80 | HR 75 | Ht 73.0 in | Wt 294.0 lb

## 2015-03-05 DIAGNOSIS — I10 Essential (primary) hypertension: Secondary | ICD-10-CM

## 2015-03-05 LAB — CBC
HCT: 39.1 % (ref 39.0–52.0)
Hemoglobin: 12.8 g/dL — ABNORMAL LOW (ref 13.0–17.0)
MCH: 29 pg (ref 26.0–34.0)
MCHC: 32.7 g/dL (ref 30.0–36.0)
MCV: 88.7 fL (ref 78.0–100.0)
MPV: 12.6 fL — ABNORMAL HIGH (ref 8.6–12.4)
Platelets: 203 10*3/uL (ref 150–400)
RBC: 4.41 MIL/uL (ref 4.22–5.81)
RDW: 14.2 % (ref 11.5–15.5)
WBC: 5.4 10*3/uL (ref 4.0–10.5)

## 2015-03-05 NOTE — Patient Instructions (Signed)
Your physician recommends that you continue on your current medications as directed. Please refer to the Current Medication list given to you today. Your physician recommends that you return for lab work in: [place] today  Your physician wants you to follow-up in: August 2017 with Dr. Harrington Challenger.  You will receive a reminder letter in the mail two months in advance. If you don't receive a letter, please call our office to schedule the follow-up appointment.

## 2015-03-05 NOTE — Progress Notes (Signed)
Cardiology Office Note   Date:  03/05/2015   ID:  Jason Russell, DOB 1950/09/12, MRN [id number]  PCP:  No PCP Per Patient  Cardiologist:   Dorris Carnes, MD   No chief complaint on file.     History of Present Illness: Jason Russell is a 64 y.o. male with a history of  64 y.o. male who returns for follow up after recent heart cath. He has a hx of HTN, sleep apnea, parox AFib, systolic CHF and dilated CM. He was converted to NSR with Amiodarone (not a Tikosyn candidate with prolonged QT). EF was thought to down due to tachycardia in rapid AFib. Echo 7/13: EF 25%. Follow up echo 04/2012: Mild LVH, EF 30-35%, Gr 1 DD, mild AI, mild MR, mild LAE. Without full recovery of LVF, he was referred for cardiac cath. LHC 07/15/12: No obstructive CAD. He has a NICM.  I saw the pt in Feb He saw [name] in September  SInce seen he has done OK from a cardiac standpoint  No CP  Breahting is OK  No palpitations  No dizziness  Current Outpatient Prescriptions  Medication Sig Dispense Refill  . amiodarone (PACERONE) 100 MG tablet Take 1 tablet (100 mg total) by mouth daily. 90 tablet 3  . amLODipine (NORVASC) 5 MG tablet TAKE 1 TABLET BY MOUTH EVERY DAY 90 tablet 0  . atorvastatin (LIPITOR) 20 MG tablet TAKE 1 TABLET BY MOUTH EVERY DAY 90 tablet 3  . buPROPion (WELLBUTRIN SR) 150 MG 12 hr tablet TAKE 1 TABLET (150 MG TOTAL) BY MOUTH 2 (TWO) TIMES DAILY. 180 tablet 3  . carvedilol (COREG) 6.25 MG tablet Take 1 tablet (6.25 mg total) by mouth 2 (two) times daily. 180 tablet 3  . colchicine 0.6 MG tablet Take 1 tablet by mouth as needed.     . ferrous gluconate (FERGON) 324 MG tablet TAKE 1 TABLET BY MOUTH 3 TIMES A DAY WITH MEALS 90 tablet 0  . hydrALAZINE (APRESOLINE) 100 MG tablet Take 1 tablet by mouth 3 (three) times daily.  1  . KLOR-CON M20 20 MEQ tablet TAKE 1 TABLET BY MOUTH EVERY DAY 30 tablet 11  . levothyroxine (SYNTHROID) 50 MCG tablet Take 1 tablet (50 mcg total) by mouth daily  before breakfast. 90 tablet 3  . lisinopril (PRINIVIL,ZESTRIL) 40 MG tablet Take 1 tablet (40 mg total) by mouth daily. 90 tablet 1  . sildenafil (VIAGRA) 50 MG tablet Take 1 tablet (50 mg total) by mouth daily as needed for erectile dysfunction. 10 tablet 0  . spironolactone (ALDACTONE) 25 MG tablet TAKE 1/2 TABLET BY MOUTH EVERY DAY 15 tablet 11  . ULORIC 40 MG tablet Take 1 tablet by mouth daily.    . Vitamin D, Ergocalciferol, (DRISDOL) 50000 UNITS CAPS capsule TAKE 1 CAPSULE WEEKLY.  3  . XARELTO 20 MG TABS tablet TAKE 1 TABLET BY MOUTH DAILY. 90 tablet 0   No current facility-administered medications for this visit.    Allergies:   Review of patient's allergies indicates no known allergies.   Past Medical History  Diagnosis Date  . Hypertension   . Dyslipidemia   . Obesity   . Atrial fibrillation (HCC)     Amiodarone started 10/2011; Coumadin  . Tobacco abuse   . COPD (chronic obstructive pulmonary disease) ([place])   . NICM (nonischemic cardiomyopathy) ([place])     [place] 4/14:  minimal CAD  . Chronic systolic heart failure ([place])     a. Echo 7/13: EF  25%;  b. echo 04/2012:  Mild LVH, EF 30-35%, Gr 1 DD, mild AI, mild MR, mild LAE  . 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 St. Jude ICD implantation 10/03/12.  . Chronic anticoagulation   . Asthma     "when I was a child"  . OSA on CPAP   . Anemia   . History of blood transfusion 10/15/2013    "don't know where the blood's going; HgB down to 5"  . Arthritis     "hx right hip"  . Gout     Past Surgical History  Procedure Laterality Date  . Total hip arthroplasty Right 11/25/1997  . Cardioversion  2011  . Tonsillectomy  1950's  . Cardiac defibrillator placement  2014  . Joint replacement    . Colonoscopy with propofol Left 10/17/2013    Procedure: COLONOSCOPY WITH PROPOFOL;  Surgeon: Inda Castle, MD;  Location: [facility];  Service: Endoscopy;  Laterality: Left;  .  Esophagogastroduodenoscopy N/A 10/17/2013    Procedure: ESOPHAGOGASTRODUODENOSCOPY (EGD);  Surgeon: Inda Castle, MD;  Location: [facility];  Service: Endoscopy;  Laterality: N/A;  . Givens capsule study N/A 10/29/2013    Procedure: GIVENS CAPSULE STUDY;  Surgeon: Inda Castle, MD;  Location: [facility];  Service: Endoscopy;  Laterality: N/A;  . Implantable cardioverter defibrillator implant Left 10/03/2012    Procedure: IMPLANTABLE CARDIOVERTER DEFIBRILLATOR IMPLANT;  Surgeon: Deboraha Sprang, MD;  Location: [facility] CATH LAB;  Service: Cardiovascular;  Laterality: Left;     Social History:  The patient  reports that he has been smoking Cigarettes.  He has a 11.25 pack-year smoking history. He has never used smokeless tobacco. He reports that he drinks about 0.6 oz of alcohol per week. He reports that he does not use illicit drugs.   Family History:  The patient's family history includes Hypertension in his mother; Lung cancer in his brother; Other in his father.    ROS:  Please see the history of present illness. All other systems are reviewed and  Negative to the above problem except as noted.    PHYSICAL EXAM: VS:  BP 138/80 mmHg  Pulse 75  Ht '6\' 1"'$  (1.854 m)  Wt 133.358 kg (294 lb)  BMI 38.80 kg/m2  SpO2 96%  GEN: Morbidly obese 64 yo  in no acute distress HEENT: normal Neck: no JVD, carotid bruits, or masses Cardiac: RRR; no murmurs, rubs, or gallops,Tr edema  Respiratory:  clear to auscultation bilaterally, normal work of breathing GI: soft, nontender, nondistended, + BS  No hepatomegaly  MS: no deformity Moving all extremities   Skin: warm and dry, no rash Neuro:  Strength and sensation are intact Psych: euthymic mood, full affect   EKG:  EKG is not ordered today.   Lipid Panel    Component Value Date/Time   CHOL 150 12/23/2012 1205   TRIG 73.0 12/23/2012 1205   HDL 46.60 12/23/2012 1205   CHOLHDL 3 12/23/2012 1205   VLDL 14.6 12/23/2012 1205   LDLCALC 89  12/23/2012 1205      Wt Readings from Last 3 Encounters:  03/05/15 133.358 kg (294 lb)  12/15/14 130.273 kg (287 lb 3.2 oz)  05/07/14 136.986 kg (302 lb)      ASSESSMENT AND PLAN:  1  Chronic systolic CHF olume status looks good  I would keep on same regimen  Check CBC  2.  Atrial fibrillation  CLinically in SR  Keep on current regimen  3.  HTN  BP is fair  No changes    4.  Morbid obesity  Discussed wt losse   F/U in AUg 2017  Signed, Dorris Carnes, MD  03/05/2015 2:43 PM    [address], [place], [place]  [address] Phone: [phone]; Fax: [phone]

## 2015-03-16 ENCOUNTER — Other Ambulatory Visit: Payer: Self-pay

## 2015-03-16 ENCOUNTER — Ambulatory Visit (INDEPENDENT_AMBULATORY_CARE_PROVIDER_SITE_OTHER): Payer: No Typology Code available for payment source | Admitting: *Deleted

## 2015-03-16 DIAGNOSIS — I5022 Chronic systolic (congestive) heart failure: Secondary | ICD-10-CM

## 2015-03-16 DIAGNOSIS — I428 Other cardiomyopathies: Secondary | ICD-10-CM

## 2015-03-16 DIAGNOSIS — I429 Cardiomyopathy, unspecified: Secondary | ICD-10-CM | POA: Diagnosis not present

## 2015-03-16 MED ORDER — SPIRONOLACTONE 25 MG PO TABS: 12.5000 mg | ORAL_TABLET | Freq: Every day | ORAL | Status: DC

## 2015-03-16 NOTE — Telephone Encounter (Signed)
Fay Records, MD at 03/05/2015 2:43 PM  spironolactone (ALDACTONE) 25 MG tabletTAKE 1/2 TABLET BY MOUTH EVERY DAY Patient Instructions     Your physician recommends that you continue on your current medications as directed. Please refer to the Current Medication list given to you today.   Notes Recorded by Fay Records, MD on 03/08/2015 at 1:55 PM CBC is OK

## 2015-03-16 NOTE — Progress Notes (Signed)
Remote ICD transmission.   

## 2015-03-20 LAB — CUP PACEART REMOTE DEVICE CHECK
Battery Remaining Longevity: 78 mo
Battery Remaining Percentage: 75 %
Brady Statistic RV Percent Paced: 1 % — CL
HighPow Impedance: 40 Ohm
Implantable Lead Implant Date: 20140703
Implantable Lead Location: 753860
Lead Channel Impedance Value: 410 Ohm
Lead Channel Setting Pacing Amplitude: 2.5 V
Lead Channel Setting Sensing Sensitivity: 0.5 mV
MDC IDC MSMT LEADCHNL RV SENSING INTR AMPL: 11.7 mV
MDC IDC SESS DTM: 20161217112804
MDC IDC SET LEADCHNL RV PACING PULSEWIDTH: 0.5 ms
Pulse Gen Serial Number: 1105031

## 2015-03-24 ENCOUNTER — Other Ambulatory Visit: Payer: Self-pay | Admitting: Internal Medicine

## 2015-03-24 ENCOUNTER — Encounter: Payer: Self-pay | Admitting: Cardiology

## 2015-04-17 ENCOUNTER — Other Ambulatory Visit: Payer: Self-pay | Admitting: Internal Medicine

## 2015-04-19 NOTE — Telephone Encounter (Signed)
Please advise on refill request as per last office visit it was under discontinued medication with a reason of patient has not taken in the last 30 days. Thanks, MI

## 2015-04-21 ENCOUNTER — Other Ambulatory Visit: Payer: Self-pay | Admitting: Internal Medicine

## 2015-05-01 ENCOUNTER — Other Ambulatory Visit: Payer: Self-pay | Admitting: Internal Medicine

## 2015-05-20 ENCOUNTER — Other Ambulatory Visit: Payer: Self-pay | Admitting: Internal Medicine

## 2015-05-21 ENCOUNTER — Other Ambulatory Visit: Payer: Self-pay | Admitting: Internal Medicine

## 2015-06-01 ENCOUNTER — Other Ambulatory Visit: Payer: Self-pay | Admitting: Internal Medicine

## 2015-06-03 MED ORDER — SILDENAFIL CITRATE 50 MG PO TABS: 50.0000 mg | ORAL_TABLET | Freq: Every day | ORAL | Status: DC | PRN

## 2015-06-04 ENCOUNTER — Telehealth: Payer: Self-pay

## 2015-06-04 MED ORDER — TADALAFIL 10 MG PO TABS: 10.0000 mg | ORAL_TABLET | Freq: Every day | ORAL | Status: DC | PRN

## 2015-06-04 NOTE — Telephone Encounter (Signed)
Per Dr. Harrington Challenger, Cialis 10 mg sent to pharmacy. Informed patient.

## 2015-06-04 NOTE — Telephone Encounter (Signed)
OK to try Cialis

## 2015-06-04 NOTE — Telephone Encounter (Signed)
Viagra '50mg'$  denied by [employer] [facility]. He must try Cialis first.

## 2015-06-15 ENCOUNTER — Ambulatory Visit (INDEPENDENT_AMBULATORY_CARE_PROVIDER_SITE_OTHER): Payer: No Typology Code available for payment source | Admitting: *Deleted

## 2015-06-15 DIAGNOSIS — I429 Cardiomyopathy, unspecified: Secondary | ICD-10-CM | POA: Diagnosis not present

## 2015-06-15 DIAGNOSIS — I428 Other cardiomyopathies: Secondary | ICD-10-CM

## 2015-06-15 DIAGNOSIS — I5022 Chronic systolic (congestive) heart failure: Secondary | ICD-10-CM

## 2015-06-15 NOTE — Progress Notes (Signed)
Remote ICD transmission.   

## 2015-06-20 ENCOUNTER — Other Ambulatory Visit: Payer: Self-pay | Admitting: Internal Medicine

## 2015-07-13 LAB — CUP PACEART REMOTE DEVICE CHECK
Battery Remaining Longevity: 78 mo
Battery Remaining Percentage: 72 %
Battery Voltage: 3.01 V
HIGH POWER IMPEDANCE MEASURED VALUE: 41 Ohm
HighPow Impedance: 41 Ohm
Implantable Lead Implant Date: 20140703
Implantable Lead Location: 753860
Lead Channel Sensing Intrinsic Amplitude: 11.7 mV
Lead Channel Setting Pacing Amplitude: 2.5 V
Lead Channel Setting Pacing Pulse Width: 0.5 ms
Lead Channel Setting Sensing Sensitivity: 0.5 mV
MDC IDC MSMT LEADCHNL RV IMPEDANCE VALUE: 400 Ohm
MDC IDC PG SERIAL: 1105031
MDC IDC SESS DTM: 20170314115520
MDC IDC STAT BRADY RV PERCENT PACED: 1 %

## 2015-07-14 ENCOUNTER — Encounter: Payer: Self-pay | Admitting: Cardiology

## 2015-07-19 ENCOUNTER — Encounter: Payer: Self-pay | Admitting: Internal Medicine

## 2015-07-19 ENCOUNTER — Ambulatory Visit (INDEPENDENT_AMBULATORY_CARE_PROVIDER_SITE_OTHER): Payer: No Typology Code available for payment source | Admitting: Internal Medicine

## 2015-07-19 VITALS — BP 148/84 | HR 72 | Ht 73.0 in | Wt 295.2 lb

## 2015-07-19 DIAGNOSIS — Z95810 Presence of automatic (implantable) cardiac defibrillator: Secondary | ICD-10-CM | POA: Diagnosis not present

## 2015-07-19 DIAGNOSIS — I428 Other cardiomyopathies: Secondary | ICD-10-CM

## 2015-07-19 DIAGNOSIS — I5022 Chronic systolic (congestive) heart failure: Secondary | ICD-10-CM

## 2015-07-19 DIAGNOSIS — I480 Paroxysmal atrial fibrillation: Secondary | ICD-10-CM

## 2015-07-19 DIAGNOSIS — I429 Cardiomyopathy, unspecified: Secondary | ICD-10-CM | POA: Diagnosis not present

## 2015-07-19 DIAGNOSIS — G4730 Sleep apnea, unspecified: Secondary | ICD-10-CM

## 2015-07-19 LAB — COMPREHENSIVE METABOLIC PANEL
ALBUMIN: 4.1 g/dL (ref 3.6–5.1)
ALT: 20 U/L (ref 9–46)
AST: 19 U/L (ref 10–35)
Alkaline Phosphatase: 122 U/L — ABNORMAL HIGH (ref 40–115)
BUN: 24 mg/dL (ref 7–25)
CHLORIDE: 109 mmol/L (ref 98–110)
CO2: 24 mmol/L (ref 20–31)
CREATININE: 1.56 mg/dL — AB (ref 0.70–1.25)
Calcium: 8.9 mg/dL (ref 8.6–10.3)
Glucose, Bld: 89 mg/dL (ref 65–99)
Potassium: 3.4 mmol/L — ABNORMAL LOW (ref 3.5–5.3)
SODIUM: 141 mmol/L (ref 135–146)
Total Bilirubin: 0.4 mg/dL (ref 0.2–1.2)
Total Protein: 6.5 g/dL (ref 6.1–8.1)

## 2015-07-19 LAB — TSH: TSH: 2.31 m[IU]/L (ref 0.40–4.50)

## 2015-07-19 MED ORDER — CARVEDILOL 12.5 MG PO TABS: 12.5000 mg | ORAL_TABLET | Freq: Two times a day (BID) | ORAL | Status: DC

## 2015-07-19 NOTE — Progress Notes (Signed)
Patient Care Team: No Pcp Per Patient as PCP - General (General Practice) Fay Records, MD (Cardiology)   HPI  Jason Russell is a 65 y.o. male Seen in follow-up for an ICD implanted 2014 for primary prevention in the context of nonischemic heart disease. He also has a history of paroxysmal atrial fibrillation  The patient denies chest pain, shortness of breath, nocturnal dyspnea, orthopnea   Does have peripheral edema; but in his mind not a problem There have been no palpitations, lightheadedness or syncope.   Patient denies symptoms of GI intolerance, sun sensitivity, neurological symptoms attributable to amiodarone.  Surveillance laboratories were in normal limits when checked 12 months ago   Echocardiogram 7/13  25% Echocardiogram 1/14 35% LHC  4/14  No obstructive CAD Echo 9/16   Past Medical History  Diagnosis Date  . Hypertension   . Dyslipidemia   . Obesity   . Atrial fibrillation (HCC)     Amiodarone started 10/2011; Coumadin  . Tobacco abuse   . COPD (chronic obstructive pulmonary disease) ([place])   . NICM (nonischemic cardiomyopathy) ([place])     [place] 4/14:  minimal CAD  . Chronic systolic heart failure ([place])     a. Echo 7/13: EF 25%;  b. echo 04/2012:  Mild LVH, EF 30-35%, Gr 1 DD, mild AI, mild MR, mild LAE  . 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 St. Jude ICD implantation 10/03/12.  . Chronic anticoagulation   . Asthma     "when I was a child"  . OSA on CPAP   . Anemia   . History of blood transfusion 10/15/2013    "don't know where the blood's going; HgB down to 5"  . Arthritis     "hx right hip"  . Gout     Past Surgical History  Procedure Laterality Date  . Total hip arthroplasty Right 11/25/1997  . Cardioversion  2011  . Tonsillectomy  1950's  . Cardiac defibrillator placement  2014  . Joint replacement    . Colonoscopy with propofol Left 10/17/2013    Procedure: COLONOSCOPY WITH PROPOFOL;  Surgeon: Inda Castle, MD;  Location: [facility];  Service: Endoscopy;  Laterality: Left;  . Esophagogastroduodenoscopy N/A 10/17/2013    Procedure: ESOPHAGOGASTRODUODENOSCOPY (EGD);  Surgeon: Inda Castle, MD;  Location: [facility];  Service: Endoscopy;  Laterality: N/A;  . Givens capsule study N/A 10/29/2013    Procedure: GIVENS CAPSULE STUDY;  Surgeon: Inda Castle, MD;  Location: [facility];  Service: Endoscopy;  Laterality: N/A;  . Implantable cardioverter defibrillator implant Left 10/03/2012    Procedure: IMPLANTABLE CARDIOVERTER DEFIBRILLATOR IMPLANT;  Surgeon: Deboraha Sprang, MD;  Location: [facility] CATH LAB;  Service: Cardiovascular;  Laterality: Left;    Current Outpatient Prescriptions  Medication Sig Dispense Refill  . amiodarone (PACERONE) 100 MG tablet TAKE 1 TABLET BY MOUTH ONCE DAILY 90 tablet 2  . amLODipine (NORVASC) 5 MG tablet TAKE 1 TABLET BY MOUTH EVERY DAY 90 tablet 2  . atorvastatin (LIPITOR) 20 MG tablet TAKE 1 TABLET BY MOUTH EVERY DAY 90 tablet 3  . buPROPion (WELLBUTRIN SR) 150 MG 12 hr tablet TAKE 1 TABLET (150 MG TOTAL) BY MOUTH 2 (TWO) TIMES DAILY. 180 tablet 3  . carvedilol (COREG) 6.25 MG tablet Take 1 tablet (6.25 mg total) by mouth 2 (two) times daily. 180 tablet 3  . colchicine 0.6 MG tablet Take 1 tablet by mouth as needed (gout flares).     [name]  ferrous gluconate (FERGON) 324 MG tablet TAKE 1 TABLET BY MOUTH 3 TIMES A DAY WITH MEALS 90 tablet 3  . hydrALAZINE (APRESOLINE) 100 MG tablet Take 1 tablet by mouth 3 (three) times daily.  1  . KLOR-CON M20 20 MEQ tablet TAKE 1 TABLET BY MOUTH EVERY DAY 30 tablet 11  . levothyroxine (SYNTHROID, LEVOTHROID) 50 MCG tablet TAKE 1 TABLET (50 MCG TOTAL) BY MOUTH DAILY BEFORE BREAKFAST. 90 tablet 1  . lisinopril (PRINIVIL,ZESTRIL) 40 MG tablet TAKE 1 TABLET BY MOUTH DAILY. 90 tablet 1  . spironolactone (ALDACTONE) 25 MG tablet Take 0.5 tablets (12.5 mg total) by mouth daily. 45 tablet 3  . tadalafil (CIALIS) 10 MG tablet Take 1 tablet  (10 mg total) by mouth daily as needed for erectile dysfunction. 10 tablet 0  . ULORIC 40 MG tablet Take 1 tablet by mouth daily.    . Vitamin D, Ergocalciferol, (DRISDOL) 50000 units CAPS capsule Take 1 capsule by mouth once a week.    [name] 20 MG TABS tablet TAKE 1 TABLET BY MOUTH DAILY. 90 tablet 2   No current facility-administered medications for this visit.    No Known Allergies    Review of Systems negative except from HPI and PMH  Physical Exam BP 148/84 mmHg  Pulse 72  Ht '6\' 1"'$  (1.854 m)  Wt 295 lb 3.2 oz (133.902 kg)  BMI 38.96 kg/m2 Well developed and well nourished in no acute distress HENT normal E scleral and icterus clear Neck Supple JVP flat; carotids brisk and full Clear to ausculation Device pocket well healed; without hematoma or erythema.  There is no tethering  *Regular rate and rhythm, no murmurs gallops or rub Soft with active bowel sounds No clubbing cyanosis 1+ Edema Alert and oriented, grossly normal motor and sensory function Skin Warm and Dry  ECGm sinus 74 14/11/41 PVC  NSST  Assessment and  Plan  HTN  ICD St Jude  The patient's device was interrogated.  The information was reviewed. No changes were made in the programming.     CHD systolic chronic  Obesity  NICM  Afib-proxysmal  ED  OSA  CPAP No intercurrent Ventricular tachycardia  Will discontinue amlodipine and uptitrate carvdelol  Needs amio surveillance lab  He would like to switch his cialis to sidenafil for cost; have alerted Dr Harrington Challenger  Needs to have OSA card read;  Will reestablish with pulm

## 2015-07-19 NOTE — Patient Instructions (Addendum)
Medication Instructions: 1) Stop Amlodipine 2) Increase Coreg (carvedilol) to 12.5 mg one tablet by mouth twice daily  Labwork: - Your physician recommends that you have lab work today: CMET/ TSH  Procedures/Testing: - none  Follow-Up: - You have been referred to : [facility]- re-establish for follow up of sleep apnea (formerly seen by Dr. Gwenette Greet).  - Remote monitoring is used to monitor your Pacemaker of ICD from home. This monitoring reduces the number of office visits required to check your device to one time per year. It allows [place] to keep an eye on the functioning of your device to ensure it is working properly. You are scheduled for a device check from home on 10/18/15. You may send your transmission at any time that day. If you have a wireless device, the transmission will be sent automatically. After your physician reviews your transmission, you will receive a postcard with your next transmission date.  - Your physician wants you to follow-up in: 1 year with Dr. Caryl Comes. You will receive a reminder letter in the mail two months in advance. If you don't receive a letter, please call our office to schedule the follow-up appointment.  Any Additional Special Instructions Will Be Listed Below (If Applicable).     If you need a refill on your cardiac medications before your next appointment, please call your pharmacy.

## 2015-07-20 LAB — CUP PACEART INCLINIC DEVICE CHECK
Brady Statistic RV Percent Paced: 0 %
Date Time Interrogation Session: 20170417130148
HIGH POWER IMPEDANCE MEASURED VALUE: 39.1803
Lead Channel Impedance Value: 412.5 Ohm
Lead Channel Pacing Threshold Amplitude: 0.5 V
Lead Channel Sensing Intrinsic Amplitude: 11.7 mV
Lead Channel Setting Pacing Pulse Width: 0.5 ms
MDC IDC LEAD IMPLANT DT: 20140703
MDC IDC LEAD LOCATION: 753860
MDC IDC MSMT BATTERY REMAINING LONGEVITY: 76.8
MDC IDC MSMT LEADCHNL RV PACING THRESHOLD AMPLITUDE: 0.5 V
MDC IDC MSMT LEADCHNL RV PACING THRESHOLD PULSEWIDTH: 0.5 ms
MDC IDC MSMT LEADCHNL RV PACING THRESHOLD PULSEWIDTH: 0.5 ms
MDC IDC SET LEADCHNL RV PACING AMPLITUDE: 2.5 V
MDC IDC SET LEADCHNL RV SENSING SENSITIVITY: 0.5 mV
Pulse Gen Serial Number: 1105031

## 2015-07-22 ENCOUNTER — Other Ambulatory Visit: Payer: Self-pay | Admitting: *Deleted

## 2015-07-22 DIAGNOSIS — E876 Hypokalemia: Secondary | ICD-10-CM

## 2015-07-25 ENCOUNTER — Other Ambulatory Visit: Payer: Self-pay | Admitting: Internal Medicine

## 2015-07-27 ENCOUNTER — Other Ambulatory Visit: Payer: Self-pay | Admitting: *Deleted

## 2015-07-27 MED ORDER — SILDENAFIL CITRATE 50 MG PO TABS: 50.0000 mg | ORAL_TABLET | Freq: Every day | ORAL | Status: DC | PRN

## 2015-08-02 DIAGNOSIS — M898X9 Other specified disorders of bone, unspecified site: Secondary | ICD-10-CM | POA: Insufficient documentation

## 2015-08-02 DIAGNOSIS — E559 Vitamin D deficiency, unspecified: Secondary | ICD-10-CM | POA: Insufficient documentation

## 2015-08-03 ENCOUNTER — Telehealth: Payer: Self-pay

## 2015-08-03 NOTE — Telephone Encounter (Signed)
Patient states his Insurance will pay for Sildenafil, not Viagra. Would you OK a Rx for this and I will sent it to [facility].

## 2015-08-03 NOTE — Telephone Encounter (Signed)
Will route Dr Alan Ripper ok to refill med of Sildenafil to prior auth Nurse for further review and follow-up of order.

## 2015-08-03 NOTE — Telephone Encounter (Signed)
OK to Rx sildenifil

## 2015-08-03 NOTE — Telephone Encounter (Signed)
I know it comes '20mg'$ , but how many to take? Also need a quantity. Maybe # 30 ? Refills?

## 2015-08-05 ENCOUNTER — Other Ambulatory Visit: Payer: Self-pay

## 2015-08-05 ENCOUNTER — Telehealth: Payer: Self-pay

## 2015-08-05 MED ORDER — SILDENAFIL CITRATE 20 MG PO TABS: 20.0000 mg | ORAL_TABLET | Freq: Every day | ORAL | Status: DC

## 2015-08-05 NOTE — Telephone Encounter (Signed)
Sildenafil '20mg'$  sent to [facility].

## 2015-08-05 NOTE — Telephone Encounter (Signed)
Prior auth for Sildenafil 20 mg tabs, sent to [facility].

## 2015-08-06 ENCOUNTER — Telehealth: Payer: Self-pay

## 2015-08-06 NOTE — Telephone Encounter (Signed)
Sildenafil 20 mg DENIED by [facility]. Patient aware, he must pay out of pocket.

## 2015-08-16 ENCOUNTER — Other Ambulatory Visit (INDEPENDENT_AMBULATORY_CARE_PROVIDER_SITE_OTHER): Payer: No Typology Code available for payment source | Admitting: *Deleted

## 2015-08-16 DIAGNOSIS — E876 Hypokalemia: Secondary | ICD-10-CM

## 2015-08-16 LAB — BASIC METABOLIC PANEL
BUN: 26 mg/dL — AB (ref 7–25)
CHLORIDE: 109 mmol/L (ref 98–110)
CO2: 24 mmol/L (ref 20–31)
CREATININE: 1.63 mg/dL — AB (ref 0.70–1.25)
Calcium: 9 mg/dL (ref 8.6–10.3)
Glucose, Bld: 91 mg/dL (ref 65–99)
Potassium: 3.5 mmol/L (ref 3.5–5.3)
Sodium: 144 mmol/L (ref 135–146)

## 2015-09-17 ENCOUNTER — Other Ambulatory Visit: Payer: Self-pay | Admitting: Internal Medicine

## 2015-10-07 ENCOUNTER — Ambulatory Visit (INDEPENDENT_AMBULATORY_CARE_PROVIDER_SITE_OTHER)
Admission: RE | Admit: 2015-10-07 | Discharge: 2015-10-07 | Disposition: A | Discharge status: Home / Self Care | Payer: No Typology Code available for payment source | Source: Ambulatory Visit | Attending: Pulmonary Disease | Admitting: Pulmonary Disease

## 2015-10-07 ENCOUNTER — Encounter: Payer: Self-pay | Admitting: Pulmonary Disease

## 2015-10-07 ENCOUNTER — Ambulatory Visit (INDEPENDENT_AMBULATORY_CARE_PROVIDER_SITE_OTHER): Payer: No Typology Code available for payment source | Admitting: Pulmonary Disease

## 2015-10-07 DIAGNOSIS — G4733 Obstructive sleep apnea (adult) (pediatric): Secondary | ICD-10-CM | POA: Diagnosis not present

## 2015-10-07 DIAGNOSIS — R0602 Shortness of breath: Secondary | ICD-10-CM

## 2015-10-07 DIAGNOSIS — J449 Chronic obstructive pulmonary disease, unspecified: Secondary | ICD-10-CM | POA: Insufficient documentation

## 2015-10-07 DIAGNOSIS — E669 Obesity, unspecified: Secondary | ICD-10-CM

## 2015-10-07 DIAGNOSIS — D689 Coagulation defect, unspecified: Secondary | ICD-10-CM | POA: Insufficient documentation

## 2015-10-07 DIAGNOSIS — IMO0001 Reserved for inherently not codable concepts without codable children: Secondary | ICD-10-CM

## 2015-10-07 DIAGNOSIS — R0609 Other forms of dyspnea: Secondary | ICD-10-CM

## 2015-10-07 DIAGNOSIS — J439 Emphysema, unspecified: Secondary | ICD-10-CM | POA: Diagnosis not present

## 2015-10-07 NOTE — Progress Notes (Signed)
Subjective:    Patient ID: Jason Russell, male    DOB: 05-28-1950, 65 y.o.   MRN: [id number]  HPI Patient returns to the office as follow-up on his sleep apnea. Last seen in December 2014. Was seen by Dr. Gwenette Greet. Patient that note, he was supposed to get a new auto CPAP machine because he has gained 70 pounds.  ROV (10/07/15)  Pt is here for f/u on his OSA. He was supposed to get a new cpap machine in 2012-07-04 but he never got one. His brother passed away in 2013-07-04 so he has been using his brother's cpap machine. Uses cpap machine. Feels better using it. More energy. Less sleepiness. No issues with it. Uses FFM. His own machine stopped working.  He needs a new cpap machine. He has CHFm afib and is S/P AICD. No new medical issues since last seen. Not on MDI. Smokes 5 cig/day.    Review of Systems  Constitutional: Negative.   HENT: Negative.   Eyes: Negative.   Respiratory: Negative.   Cardiovascular: Negative.   Gastrointestinal: Negative.   Endocrine: Negative.   Genitourinary: Negative.   Musculoskeletal: Negative.   Allergic/Immunologic: Negative.   Neurological: Negative.   Hematological: Negative.   Psychiatric/Behavioral: Negative.   All other systems reviewed and are negative.  Past Medical History  Diagnosis Date  . Hypertension   . Dyslipidemia   . Obesity   . Atrial fibrillation (HCC)     Amiodarone started 10/2011; Coumadin  . Tobacco abuse   . COPD (chronic obstructive pulmonary disease) ([place])   . NICM (nonischemic cardiomyopathy) ([place])     [place] 4/14:  minimal CAD  . Chronic systolic heart failure ([place])     a. Echo 7/13: EF 25%;  b. echo 04/2012:  Mild LVH, EF 30-35%, Gr 1 DD, mild AI, mild MR, mild LAE  . 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 St. Jude ICD implantation 10/03/12.  . Chronic anticoagulation   . Asthma     "when I was a child"  . OSA on CPAP   . Anemia   . History of blood transfusion 10/15/2013    "don't know where  the blood's going; HgB down to 5"  . Arthritis     "hx right hip"  . Gout      Family History  Problem Relation Age of Onset  . Other Father     deceased- unknow reason  . Hypertension Mother   . Lung cancer Brother      Past Surgical History  Procedure Laterality Date  . Total hip arthroplasty Right 11/25/1997  . Cardioversion  2009-07-04  . Tonsillectomy  1950's  . Cardiac defibrillator placement  07/04/2012  . Joint replacement    . Colonoscopy with propofol Left 10/17/2013    Procedure: COLONOSCOPY WITH PROPOFOL;  Surgeon: Inda Castle, MD;  Location: [facility];  Service: Endoscopy;  Laterality: Left;  . Esophagogastroduodenoscopy N/A 10/17/2013    Procedure: ESOPHAGOGASTRODUODENOSCOPY (EGD);  Surgeon: Inda Castle, MD;  Location: [facility];  Service: Endoscopy;  Laterality: N/A;  . Givens capsule study N/A 10/29/2013    Procedure: GIVENS CAPSULE STUDY;  Surgeon: Inda Castle, MD;  Location: [facility];  Service: Endoscopy;  Laterality: N/A;  . Implantable cardioverter defibrillator implant Left 10/03/2012    Procedure: IMPLANTABLE CARDIOVERTER DEFIBRILLATOR IMPLANT;  Surgeon: Deboraha Sprang, MD;  Location: [facility] CATH LAB;  Service: Cardiovascular;  Laterality: Left;    Social History  Social History  . Marital Status: Married    Spouse Name: N/A  . Number of Children: 1  . Years of Education: N/A   Occupational History  . [employer] Operator    Social History Main Topics  . Smoking status: Current Every Day Smoker -- 0.25 packs/day for 45 years    Types: Cigarettes  . Smokeless tobacco: Never Used  . Alcohol Use: 0.6 oz/week    1 Cans of beer per week  . Drug Use: No  . Sexual Activity: Not Currently   Other Topics Concern  . Not on file   Social History Narrative     No Known Allergies   Outpatient Prescriptions Prior to Visit  Medication Sig Dispense Refill  . amiodarone (PACERONE) 100 MG tablet TAKE 1 TABLET BY MOUTH ONCE DAILY 90 tablet 2    . atorvastatin (LIPITOR) 20 MG tablet TAKE 1 TABLET BY MOUTH EVERY DAY 90 tablet 3  . buPROPion (WELLBUTRIN SR) 150 MG 12 hr tablet TAKE 1 TABLET (150 MG TOTAL) BY MOUTH 2 (TWO) TIMES DAILY. 180 tablet 3  . carvedilol (COREG) 12.5 MG tablet Take 1 tablet (12.5 mg total) by mouth 2 (two) times daily. 180 tablet 3  . colchicine 0.6 MG tablet Take 1 tablet by mouth as needed (gout flares).     . ferrous gluconate (FERGON) 324 MG tablet TAKE 1 TABLET BY MOUTH 3 TIMES A DAY WITH MEALS 90 tablet 2  . hydrALAZINE (APRESOLINE) 100 MG tablet Take 1 tablet by mouth 3 (three) times daily.  1  . KLOR-CON M20 20 MEQ tablet TAKE 1 TABLET BY MOUTH EVERY DAY 30 tablet 11  . levothyroxine (SYNTHROID, LEVOTHROID) 50 MCG tablet TAKE 1 TABLET (50 MCG TOTAL) BY MOUTH DAILY BEFORE BREAKFAST. 90 tablet 1  . lisinopril (PRINIVIL,ZESTRIL) 40 MG tablet TAKE 1 TABLET BY MOUTH DAILY. 90 tablet 1  . sildenafil (REVATIO) 20 MG tablet Take 1 tablet (20 mg total) by mouth at bedtime. Take 2-3 tablets daily prn for ED 30 tablet 2  . spironolactone (ALDACTONE) 25 MG tablet Take 0.5 tablets (12.5 mg total) by mouth daily. 45 tablet 3  . ULORIC 40 MG tablet Take 1 tablet by mouth daily.    . Vitamin D, Ergocalciferol, (DRISDOL) 50000 units CAPS capsule Take 1 capsule by mouth once a week.    [name] 20 MG TABS tablet TAKE 1 TABLET BY MOUTH DAILY. 90 tablet 2   No facility-administered medications prior to visit.   No orders of the defined types were placed in this encounter.          Objective:   Physical Exam  Vitals:  Filed Vitals:   10/07/15 1341  BP: 138/62  Pulse: 81  Height: '6\' 1"'$  (1.854 m)  Weight: 292 lb (132.45 kg)  SpO2: 96%    Constitutional/General:  Pleasant, well-nourished, well-developed, not in any distress,  Comfortably seating.  Well kempt  Body mass index is 38.53 kg/(m^2). Wt Readings from Last 3 Encounters:  10/07/15 292 lb (132.45 kg)  07/19/15 295 lb 3.2 oz (133.902 kg)  03/05/15  294 lb (133.358 kg)     HEENT: Pupils equal and reactive to light and accommodation. Anicteric sclerae. Normal nasal mucosa.   No oral  lesions,  mouth clear,  oropharynx clear, no postnasal drip. (-) Oral thrush. No dental caries.  Airway - Mallampati class IV  Neck: No masses. Midline trachea. No JVD, (-) LAD. (-) bruits appreciated.  Respiratory/Chest: Grossly normal chest. (-)  deformity. (-) Accessory muscle use.  Symmetric expansion. (-) Tenderness on palpation.  Resonant on percussion.  Diminished BS on both lower lung zones. (-) wheezing, crackles, rhonchi (-) egophony  Cardiovascular: Regular rate and  rhythm, heart sounds normal, no murmur or gallops, no peripheral edema  Gastrointestinal:  Normal bowel sounds. Soft, non-tender. No hepatosplenomegaly.  (-) masses.   Musculoskeletal:  Normal muscle tone. Normal gait.   Extremities: Grossly normal. (-) clubbing, cyanosis.  (-) edema  Skin: (-) rash,lesions seen.   Neurological/Psychiatric : alert, oriented to time, place, person. Normal mood and affect            Assessment & Plan:  Obstructive sleep apnea NPSG 2009-06-18:  AHI 33/hr, cpap titrated to 17cm. Pt on auto setting.   Download 03/2013:  Optimal pressure 15-16cm. He was supposed to get a new cpap machine in 2012-06-18 but he never got one. His brother passed away in 06/18/13 so he has been using his brother's cpap machine. Uses cpap machine. Feels better using it. More energy. Less sleepiness. No issues with it. Uses FFM. His own machine stopped working.  He needs a new cpap machine. He has CHFm afib and is S/P AICD.  Plan : 1. We will try to get a new CPAP machine. Auto CPAP, 5-20 cm water. He has a sleep study in 06/18/09. If the sleep study will not work, he will need an in lab sleep study. 2. Need to see him 1-2 months after he gets his new CPAP machine. 3. Patient has significant comorbidities requiringCPAP treatment.  4. His old machine His using his brother  CPAP machine.  We extensively discussed the diagnosis, pathophysiology, and treatment options for Obstructive Sleep Apnea (OSA).  We discussed treatment options for OSA including CPAP, BiPaP, as well as surgical options and oral devices.   Patient was instructed to call the office if he/she has not received the PAP device in 1-2 weeks.  Patient was instructed to have mask, tubings, filter, reservoir cleaned at least once a week with soapy water.  Patient was instructed to call the office if he/she is having issues with the PAP device.    I advised patient to obtain sufficient amount of sleep --  7 to 8 hours at least in a 24 hr period.  Patient was advised to follow good sleep hygiene.  Patient was advised NOT to engage in activities requiring concentration and/or vigilance if he/she is and  sleepy.  Patient is NOT to drive if he/she is sleepy.        Obesity Weight reduction  COPD (chronic obstructive pulmonary disease) ([place]) Patient likely has mild to moderate COPD. Continues to smoke. His underlying heart disease as well. Once to hold off on MDIs. Will need PFTs if dyspnea worsens.  Exertional dyspnea Multifactorial. Secondary to COPD, morbid obesity,CHF, A. Fib, OSA, obesity hypoventilation syndrome. Plan for chest x-ray.  TOBACCO ABUSE Advised on smoking cessation.    Monica Becton, MD 10/07/2015, 2:09 PM [place] [facility] Pager [phone] After 3 pm or if no answer, call [phone]

## 2015-10-07 NOTE — Assessment & Plan Note (Signed)
Weight reduction 

## 2015-10-07 NOTE — Assessment & Plan Note (Signed)
Multifactorial. Secondary to COPD, morbid obesity,CHF, A. Fib, OSA, obesity hypoventilation syndrome. Plan for chest x-ray.

## 2015-10-07 NOTE — Assessment & Plan Note (Addendum)
NPSG Jul 12, 2009:  AHI 33/hr, cpap titrated to 17cm. Pt on auto setting.   Download 03/2013:  Optimal pressure 15-16cm. He was supposed to get a new cpap machine in July 12, 2012 but he never got one. His brother passed away in 2013/07/12 so he has been using his brother's cpap machine. Uses cpap machine. Feels better using it. More energy. Less sleepiness. No issues with it. Uses FFM. His own machine stopped working.  He needs a new cpap machine. He has CHFm afib and is S/P AICD.  Plan : 1. We will try to get a new CPAP machine. Auto CPAP, 5-20 cm water. He has a sleep study in Jul 12, 2009. If the sleep study will not work, he will need an in lab sleep study. 2. Need to see him 1-2 months after he gets his new CPAP machine. 3. Patient has significant comorbidities requiringCPAP treatment.  4. His old machine His using his brother CPAP machine.  We extensively discussed the diagnosis, pathophysiology, and treatment options for Obstructive Sleep Apnea (OSA).  We discussed treatment options for OSA including CPAP, BiPaP, as well as surgical options and oral devices.   Patient was instructed to call the office if he/she has not received the PAP device in 1-2 weeks.  Patient was instructed to have mask, tubings, filter, reservoir cleaned at least once a week with soapy water.  Patient was instructed to call the office if he/she is having issues with the PAP device.    I advised patient to obtain sufficient amount of sleep --  7 to 8 hours at least in a 24 hr period.  Patient was advised to follow good sleep hygiene.  Patient was advised NOT to engage in activities requiring concentration and/or vigilance if he/she is and  sleepy.  Patient is NOT to drive if he/she is sleepy.

## 2015-10-07 NOTE — Assessment & Plan Note (Signed)
Advised on smoking cessation. 

## 2015-10-07 NOTE — Assessment & Plan Note (Signed)
Patient likely has mild to moderate COPD. Continues to smoke. His underlying heart disease as well. Once to hold off on MDIs. Will need PFTs if dyspnea worsens.

## 2015-10-07 NOTE — Patient Instructions (Signed)
  It was a pleasure taking care of you today!  You are diagnosed with Obstructive Sleep Apnea or OSA.  You stop breathing 33 times an hour  We will order you an autocpap  machine.  Please call the office if you do NOT receive your machine in the next 1-2 weeks.   Please make sure you use your CPAP device everytime you sleep.  We will monitor the usage of your machine per your insurance requirement.  Your insurance company may take the machine from you if you are not using it regularly.   Please clean the mask, tubings, filter, water reservoir with soapy water every week.  Please use distilled water for the water reservoir.   Please call the office or your machine provider (DME company) if you are having issues with the device.   We will get a chest x-ray as well today.  Return to clinic in 2 months with Dr. Corrie Dandy or NP

## 2015-10-08 ENCOUNTER — Other Ambulatory Visit: Payer: Self-pay

## 2015-10-08 MED ORDER — BUPROPION HCL ER (SR) 150 MG PO TB12: ORAL_TABLET | ORAL | Status: DC

## 2015-10-08 NOTE — Telephone Encounter (Signed)
ferrous gluconate (FERGON) 324 MG tablet  Medication   Date: 07/26/2015  Department: [facility] [address] [facility]  Ordering/Authorizing: Fay Records, MD      Order Providers    Prescribing Provider Encounter Provider   Fay Records, MD Fay Records, MD    Medication Detail      Disp Refills Start End     ferrous gluconate (FERGON) 324 MG tablet 90 tablet 2 07/26/2015     Sig: TAKE 1 TABLET BY MOUTH 3 TIMES A DAY WITH MEALS    E-Prescribing Status: Receipt confirmed by pharmacy (07/26/2015 5:37 PM EDT)     Pharmacy    [id number] [place] Sent 07/29/15

## 2015-10-08 NOTE — Telephone Encounter (Deleted)
ferrous gluconate (FERGON) 324 MG tablet  Medication   Date: 07/26/2015  Department: [facility] [address] [facility]  Ordering/Authorizing: Fay Records, MD      Order Providers    Prescribing Provider Encounter Provider   Fay Records, MD Fay Records, MD    Medication Detail      Disp Refills Start End     ferrous gluconate (FERGON) 324 MG tablet 90 tablet 2 07/26/2015     Sig: TAKE 1 TABLET BY MOUTH 3 TIMES A DAY WITH MEALS    E-Prescribing Status: Receipt confirmed by pharmacy (07/26/2015 5:37 PM EDT)     Pharmacy    [id number] [place] [place] Filled on 07/26/15

## 2015-10-11 ENCOUNTER — Institutional Professional Consult (permissible substitution): Payer: No Typology Code available for payment source | Admitting: Pulmonary Disease

## 2015-10-18 ENCOUNTER — Ambulatory Visit (INDEPENDENT_AMBULATORY_CARE_PROVIDER_SITE_OTHER): Payer: No Typology Code available for payment source | Admitting: *Deleted

## 2015-10-18 DIAGNOSIS — I429 Cardiomyopathy, unspecified: Secondary | ICD-10-CM

## 2015-10-18 DIAGNOSIS — I428 Other cardiomyopathies: Secondary | ICD-10-CM

## 2015-10-18 NOTE — Progress Notes (Signed)
Remote ICD transmission.   

## 2015-10-20 ENCOUNTER — Encounter: Payer: Self-pay | Admitting: Cardiology

## 2015-10-20 ENCOUNTER — Other Ambulatory Visit: Payer: Self-pay | Admitting: Internal Medicine

## 2015-10-21 ENCOUNTER — Other Ambulatory Visit: Payer: Self-pay | Admitting: *Deleted

## 2015-10-21 LAB — CUP PACEART REMOTE DEVICE CHECK
HighPow Impedance: 36 Ohm
HighPow Impedance: 36 Ohm
Implantable Lead Implant Date: 20140703
Implantable Lead Location: 753860
Lead Channel Sensing Intrinsic Amplitude: 11 mV
Lead Channel Setting Pacing Pulse Width: 0.5 ms
MDC IDC MSMT BATTERY REMAINING LONGEVITY: 74 mo
MDC IDC MSMT BATTERY REMAINING PERCENTAGE: 70 %
MDC IDC MSMT BATTERY VOLTAGE: 2.99 V
MDC IDC MSMT LEADCHNL RV IMPEDANCE VALUE: 430 Ohm
MDC IDC PG SERIAL: 1105031
MDC IDC SESS DTM: 20170717060018
MDC IDC SET LEADCHNL RV PACING AMPLITUDE: 2.5 V
MDC IDC SET LEADCHNL RV SENSING SENSITIVITY: 0.5 mV
MDC IDC STAT BRADY RV PERCENT PACED: 1 %

## 2015-10-25 ENCOUNTER — Telehealth: Payer: Self-pay | Admitting: Pulmonary Disease

## 2015-10-25 NOTE — Telephone Encounter (Signed)
LVM for [facility] to return our call as their office was closed.   Called spoke with pt.Informed him that [facility] was close and that I left a VM for them to return my call. I explained to him that I would discuss with them the CPAP issue. He voiced understanding and had no further questions.   Will hold in triage

## 2015-10-26 NOTE — Telephone Encounter (Signed)
LMTCB for [facility]

## 2015-10-27 NOTE — Telephone Encounter (Signed)
Called and spoke with jennifer at the [facility] and she stated that the pt has an appt today at 2.  Nothing further is needed.

## 2015-11-18 ENCOUNTER — Ambulatory Visit (INDEPENDENT_AMBULATORY_CARE_PROVIDER_SITE_OTHER): Payer: No Typology Code available for payment source

## 2015-11-18 DIAGNOSIS — Z95810 Presence of automatic (implantable) cardiac defibrillator: Secondary | ICD-10-CM | POA: Diagnosis not present

## 2015-11-18 DIAGNOSIS — I5022 Chronic systolic (congestive) heart failure: Secondary | ICD-10-CM

## 2015-11-18 NOTE — Progress Notes (Signed)
EPIC Encounter for ICM Monitoring  Patient Name: Jason Russell is a 65 y.o. male Date: 11/18/2015 Primary Care Physican: No PCP Per Patient Primary Cardiologist: Harrington Challenger Electrophysiologist: Caryl Comes Dry Weight: 280 lb     1st ICM encounter.  Patient referred to [facility] clinic by Debroah Loop, device RN.  Heart Failure questions reviewed, pt asymptomatic.  He has a cold but no fluid symptoms  Thoracic impedance normal with peaks above baseline suggesting dryness.  Recommendations: No changes.  Low sodium diet education provided.    Follow-up plan: ICM clinic phone appointment on 12/22/2015.  Copy of ICM check sent to device physician.   ICM trend: 11/18/2015       [name], RN 11/18/2015 11:40 AM

## 2015-12-03 ENCOUNTER — Telehealth: Payer: Self-pay | Admitting: Pulmonary Disease

## 2015-12-03 NOTE — Telephone Encounter (Signed)
   CPAP download from October 27, 2015  for 1 month shows 100% compliance, AHI 2.7. Auto CPAP 5-15 cm water. Continue CPAP therapy.  Monica Becton, MD 12/03/2015, 9:25 AM [place] [facility] Pager [phone] After 3 pm or if no answer, call [phone]

## 2015-12-08 ENCOUNTER — Encounter: Payer: Self-pay | Admitting: Pulmonary Disease

## 2015-12-08 ENCOUNTER — Other Ambulatory Visit: Payer: Self-pay | Admitting: Internal Medicine

## 2015-12-08 ENCOUNTER — Ambulatory Visit (INDEPENDENT_AMBULATORY_CARE_PROVIDER_SITE_OTHER): Payer: No Typology Code available for payment source | Admitting: Pulmonary Disease

## 2015-12-08 DIAGNOSIS — E669 Obesity, unspecified: Secondary | ICD-10-CM

## 2015-12-08 DIAGNOSIS — F17200 Nicotine dependence, unspecified, uncomplicated: Secondary | ICD-10-CM

## 2015-12-08 DIAGNOSIS — J430 Unilateral pulmonary emphysema [MacLeod's syndrome]: Secondary | ICD-10-CM

## 2015-12-08 DIAGNOSIS — Z23 Encounter for immunization: Secondary | ICD-10-CM | POA: Diagnosis not present

## 2015-12-08 DIAGNOSIS — G4733 Obstructive sleep apnea (adult) (pediatric): Secondary | ICD-10-CM

## 2015-12-08 NOTE — Assessment & Plan Note (Signed)
Multifactorial. Secondary to COPD, morbid obesity,CHF, A. Fib, OSA, obesity hypoventilation syndrome. CXR (10/2015)  Cardiomegaly. No infiltrates seen. Low lung volumes.

## 2015-12-08 NOTE — Assessment & Plan Note (Signed)
NPSG 2011:  AHI 33/hr, cpap titrated to 17cm. Patient received new machine July/2017. Auto CPAP 5-20 centimeters water.  Feels better using it. More energy. Less sleepiness. Feels benefit of CPAP. No issues. Download the last month: 97%, AHI 2.  Plan:  We extensively discussed the importance of treating OSA and the need to use PAP therapy.   Continue with autocpap 5-20 cm water. Tolerating well. No issues.    Patient was instructed to have mask, tubings, filter, reservoir cleaned at least once a week with soapy water.  Patient was instructed to call the office if he/she is having issues with the PAP device.    I advised patient to obtain sufficient amount of sleep --  7 to 8 hours at least in a 24 hr period.  Patient was advised to follow good sleep hygiene.  Patient was advised NOT to engage in activities requiring concentration and/or vigilance if he/she is and  sleepy.  Patient is NOT to drive if he/she is sleepy.

## 2015-12-08 NOTE — Progress Notes (Signed)
Subjective:    Patient ID: Jason Russell, male    DOB: Dec 16, 1950, 65 y.o.   MRN: [id number]  HPI Patient returns to the office as follow-up on his sleep apnea. Last seen in December 2014. Was seen by Dr. Gwenette Greet. Patient that note, he was supposed to get a new auto CPAP machine because he has gained 70 pounds.  ROV (10/07/15)  Pt is here for f/u on his OSA. He was supposed to get a new cpap machine in 23-Jun-2012 but he never got one. His brother passed away in 06/23/13 so he has been using his brother's cpap machine. Uses cpap machine. Feels better using it. More energy. Less sleepiness. No issues with it. Uses FFM. His own machine stopped working.  He needs a new cpap machine. He has CHFm afib and is S/P AICD. No new medical issues since last seen. Not on MDI. Smokes 5 cig/day.   ROV 12/08/15 Patient returns to the office as follow-up with sleep apnea. Since  last seen, he received his CPAP machine. He is doing well. Feels benefit of CPAP. No  issues. Feels benefit of CPAP. Download the last month: 97%, AHI 1.9. He continues to smokes cigs. Breathing is stable.  Review of Systems  Constitutional: Negative.   HENT: Negative.   Eyes: Negative.   Respiratory: Negative.   Cardiovascular: Negative.   Gastrointestinal: Negative.   Endocrine: Negative.   Genitourinary: Negative.   Musculoskeletal: Negative.   Allergic/Immunologic: Negative.   Neurological: Negative.   Hematological: Negative.   Psychiatric/Behavioral: Negative.   All other systems reviewed and are negative.      Objective:   Physical Exam  Vitals:  Vitals:   12/08/15 1424  BP: 124/80  Pulse: 88  SpO2: 97%  Weight: 280 lb (127 kg)  Height: '6\' 1"'$  (1.854 m)    Constitutional/General:  Pleasant, well-nourished, well-developed, not in any distress,  Comfortably seating.  Well kempt  Body mass index is 36.94 kg/m. Wt Readings from Last 3 Encounters:  12/08/15 280 lb (127 kg)  10/07/15 292 lb (132.5 kg)  07/19/15 295 lb  3.2 oz (133.9 kg)     HEENT: Pupils equal and reactive to light and accommodation. Anicteric sclerae. Normal nasal mucosa.   No oral  lesions,  mouth clear,  oropharynx clear, no postnasal drip. (-) Oral thrush. No dental caries.  Airway - Mallampati class IV  Neck: No masses. Midline trachea. No JVD, (-) LAD. (-) bruits appreciated.  Respiratory/Chest: Grossly normal chest. (-) deformity. (-) Accessory muscle use.  Symmetric expansion. (-) Tenderness on palpation.  Resonant on percussion.  Diminished BS on both lower lung zones. (-) wheezing, crackles, rhonchi (-) egophony  Cardiovascular: Regular rate and  rhythm, heart sounds normal, no murmur or gallops, no peripheral edema  Gastrointestinal:  Normal bowel sounds. Soft, non-tender. No hepatosplenomegaly.  (-) masses.   Musculoskeletal:  Normal muscle tone. Normal gait.   Extremities: Grossly normal. (-) clubbing, cyanosis.  (-) edema  Skin: (-) rash,lesions seen.   Neurological/Psychiatric : alert, oriented to time, place, person. Normal mood and affect            Assessment & Plan:  Obstructive sleep apnea NPSG 06/23/2009:  AHI 33/hr, cpap titrated to 17cm. Patient received new machine July/2017. Auto CPAP 5-20 centimeters water.  Feels better using it. More energy. Less sleepiness. Feels benefit of CPAP. No issues. Download the last month: 97%, AHI 2.  Plan:  We extensively discussed the importance of treating OSA and the need  to use PAP therapy.   Continue with autocpap 5-20 cm water. Tolerating well. No issues.    Patient was instructed to have mask, tubings, filter, reservoir cleaned at least once a week with soapy water.  Patient was instructed to call the office if he/she is having issues with the PAP device.    I advised patient to obtain sufficient amount of sleep --  7 to 8 hours at least in a 24 hr period.  Patient was advised to follow good sleep hygiene.  Patient was advised NOT to engage in  activities requiring concentration and/or vigilance if he/she is and  sleepy.  Patient is NOT to drive if he/she is sleepy.      Obesity Weight reduction   TOBACCO ABUSE counselled on smoking cessation.   COPD (chronic obstructive pulmonary disease) ([place]) Patient likely has mild to moderate COPD. Continues to smoke. His underlying heart disease as well. Wants to hold off on MDIs. Will need PFTs if dyspnea worsens. Influenza vaccine 12/08/15.  We'll need pneumonia vaccines when he hits 35.  Exertional dyspnea Multifactorial. Secondary to COPD, morbid obesity,CHF, A. Fib, OSA, obesity hypoventilation syndrome. CXR (10/2015)  Cardiomegaly. No infiltrates seen. Low lung volumes.  Return to clinic in 6 mos.    Monica Becton, MD 12/08/2015, 2:48 PM [facility] Pager [phone] After 3 pm or if no answer, call [phone]

## 2015-12-08 NOTE — Assessment & Plan Note (Signed)
Weight reduction 

## 2015-12-08 NOTE — Assessment & Plan Note (Signed)
counselled on smoking cessation.

## 2015-12-08 NOTE — Patient Instructions (Signed)
  It was a pleasure taking care of you today!  Continue using your CPAP machine.   Please make sure you use your CPAP device everytime you sleep.  We will monitor the usage of your machine per your insurance requirement.  Your insurance company may take the machine from you if you are not using it regularly.   Please clean the mask, tubings, filter, water reservoir with soapy water every week.  Please use distilled water for the water reservoir.   Please call the office or your machine provider (DME company) if you are having issues with the device.   Return to clinic in 6 months  with Dr. De Dios    

## 2015-12-08 NOTE — Assessment & Plan Note (Signed)
Patient likely has mild to moderate COPD. Continues to smoke. His underlying heart disease as well. Wants to hold off on MDIs. Will need PFTs if dyspnea worsens. Influenza vaccine 12/08/15.  We'll need pneumonia vaccines when he hits 75.

## 2015-12-22 ENCOUNTER — Ambulatory Visit (INDEPENDENT_AMBULATORY_CARE_PROVIDER_SITE_OTHER): Payer: No Typology Code available for payment source

## 2015-12-22 ENCOUNTER — Telehealth: Payer: Self-pay

## 2015-12-22 DIAGNOSIS — Z95810 Presence of automatic (implantable) cardiac defibrillator: Secondary | ICD-10-CM

## 2015-12-22 DIAGNOSIS — I5022 Chronic systolic (congestive) heart failure: Secondary | ICD-10-CM | POA: Diagnosis not present

## 2015-12-22 NOTE — Telephone Encounter (Signed)
Error

## 2015-12-22 NOTE — Progress Notes (Signed)
EPIC Encounter for ICM Monitoring  Patient Name: Jason Russell is a 65 y.o. male Date: 12/22/2015 Primary Care Physican: No PCP Per Patient Primary Cardiologist: Harrington Challenger Electrophysiologist: Caryl Comes Dry Weight: 279 lb         Heart Failure questions reviewed, pt asymptomatic  Thoracic impedance normal but was abnormal suggesting fluid accumulation 11/28/2015 to 12/07/2015 and 12/09/2015 and 12/13/2015 and denied any symptoms during that time.   Recommendations: No changes.  Discussed low salt diet and limit fluid intake to 64 oz daily.  He stated he drinks lot of fluid.  He works nights for [employer].  He is not currently prescribed diuretic.  Follow-up plan: ICM clinic phone appointment on 01/25/2016.  Copy of ICM check sent to device physician.   ICM trend: 12/22/2015       [name], RN 12/22/2015 12:42 PM

## 2015-12-27 ENCOUNTER — Encounter: Payer: Self-pay | Admitting: Pulmonary Disease

## 2016-01-21 ENCOUNTER — Other Ambulatory Visit: Payer: Self-pay | Admitting: *Deleted

## 2016-01-21 MED ORDER — POTASSIUM CHLORIDE CRYS ER 20 MEQ PO TBCR: 20.0000 meq | EXTENDED_RELEASE_TABLET | Freq: Every day | ORAL | 0 refills | Status: DC

## 2016-01-25 ENCOUNTER — Ambulatory Visit (INDEPENDENT_AMBULATORY_CARE_PROVIDER_SITE_OTHER): Payer: No Typology Code available for payment source | Admitting: *Deleted

## 2016-01-25 DIAGNOSIS — I428 Other cardiomyopathies: Secondary | ICD-10-CM

## 2016-01-25 DIAGNOSIS — I5022 Chronic systolic (congestive) heart failure: Secondary | ICD-10-CM | POA: Diagnosis not present

## 2016-01-25 DIAGNOSIS — Z95810 Presence of automatic (implantable) cardiac defibrillator: Secondary | ICD-10-CM | POA: Diagnosis not present

## 2016-01-25 NOTE — Progress Notes (Signed)
Remote ICD transmission.   

## 2016-01-26 ENCOUNTER — Encounter: Payer: Self-pay | Admitting: Cardiology

## 2016-01-27 ENCOUNTER — Telehealth: Payer: Self-pay

## 2016-01-27 NOTE — Progress Notes (Signed)
EPIC Encounter for ICM Monitoring  Patient Name: Jason Russell is a 65 y.o. male Date: 01/27/2016 Primary Care Physican: No PCP Per Patient Primary Cardiologist:Ross Electrophysiologist: Caryl Comes Dry Weight:  unknown       Attempted ICM call and unable to reach. Left detailed message regarding transmission.  Transmission reviewed.   Thoracic impedance abnormal suggesting fluid accumulation since 01/23/2016.  Patient does not take diuretic.  Recommendations:  Unable to reach  Follow-up plan: ICM clinic phone appointment on 02/29/2016.  Copy of ICM check sent to primary cardiologist and device physician for review but unable to reach patient.   ICM trend: 01/25/2016       [name], RN 01/27/2016 8:43 AM

## 2016-01-27 NOTE — Telephone Encounter (Signed)
Remote ICM transmission received.  Attempted patient call and left detailed message regarding transmission and next ICM scheduled for 02/29/2016.  Advised to return call for any fluid symptoms or questions.

## 2016-02-03 ENCOUNTER — Other Ambulatory Visit: Payer: Self-pay | Admitting: Internal Medicine

## 2016-02-04 NOTE — Progress Notes (Signed)
Pt should be set up for BMET and BNP   Should prob be on lasix based on results of impedence check.

## 2016-02-07 ENCOUNTER — Encounter: Payer: Self-pay | Admitting: *Deleted

## 2016-02-07 DIAGNOSIS — R0602 Shortness of breath: Secondary | ICD-10-CM

## 2016-02-07 NOTE — Telephone Encounter (Signed)
-----   Message from Fay Records, MD sent at 02/04/2016  2:08 PM EDT -----   ----- Message ----- From: [name], RN Sent: 01/27/2016   8:49 AM To: Fay Records, MD

## 2016-02-10 ENCOUNTER — Other Ambulatory Visit: Payer: No Typology Code available for payment source | Admitting: *Deleted

## 2016-02-10 DIAGNOSIS — R0602 Shortness of breath: Secondary | ICD-10-CM

## 2016-02-11 ENCOUNTER — Other Ambulatory Visit: Payer: Self-pay | Admitting: *Deleted

## 2016-02-11 LAB — BRAIN NATRIURETIC PEPTIDE: Brain Natriuretic Peptide: 366.4 pg/mL — ABNORMAL HIGH (ref <100)

## 2016-02-11 NOTE — Telephone Encounter (Signed)
Okay to refill? Please advise. Thanks, MI 

## 2016-02-14 NOTE — Telephone Encounter (Signed)
Please refill.  Thank you 

## 2016-02-15 ENCOUNTER — Other Ambulatory Visit: Payer: No Typology Code available for payment source | Admitting: *Deleted

## 2016-02-15 DIAGNOSIS — R0602 Shortness of breath: Secondary | ICD-10-CM

## 2016-02-15 LAB — BASIC METABOLIC PANEL
BUN: 25 mg/dL (ref 7–25)
CHLORIDE: 108 mmol/L (ref 98–110)
CO2: 27 mmol/L (ref 20–31)
CREATININE: 1.76 mg/dL — AB (ref 0.70–1.25)
Calcium: 8.7 mg/dL (ref 8.6–10.3)
Glucose, Bld: 74 mg/dL (ref 65–99)
Potassium: 3.7 mmol/L (ref 3.5–5.3)
Sodium: 142 mmol/L (ref 135–146)

## 2016-02-15 MED ORDER — FERROUS GLUCONATE 324 (38 FE) MG PO TABS: ORAL_TABLET | ORAL | 0 refills | Status: DC

## 2016-02-24 LAB — CUP PACEART REMOTE DEVICE CHECK
Battery Remaining Longevity: 72 mo
Brady Statistic RV Percent Paced: 1 %
HighPow Impedance: 39 Ohm
HighPow Impedance: 39 Ohm
Implantable Lead Location: 753860
Lead Channel Setting Pacing Amplitude: 2.5 V
Lead Channel Setting Pacing Pulse Width: 0.5 ms
Lead Channel Setting Sensing Sensitivity: 0.5 mV
MDC IDC LEAD IMPLANT DT: 20140703
MDC IDC MSMT BATTERY REMAINING PERCENTAGE: 68 %
MDC IDC MSMT BATTERY VOLTAGE: 2.99 V
MDC IDC MSMT LEADCHNL RV IMPEDANCE VALUE: 380 Ohm
MDC IDC MSMT LEADCHNL RV PACING THRESHOLD AMPLITUDE: 0.5 V
MDC IDC MSMT LEADCHNL RV PACING THRESHOLD PULSEWIDTH: 0.5 ms
MDC IDC MSMT LEADCHNL RV SENSING INTR AMPL: 11.6 mV
MDC IDC PG IMPLANT DT: 20140703
MDC IDC SESS DTM: 20171024092340
Pulse Gen Serial Number: 1105031

## 2016-02-29 ENCOUNTER — Ambulatory Visit (INDEPENDENT_AMBULATORY_CARE_PROVIDER_SITE_OTHER): Payer: No Typology Code available for payment source

## 2016-02-29 DIAGNOSIS — Z95810 Presence of automatic (implantable) cardiac defibrillator: Secondary | ICD-10-CM

## 2016-02-29 DIAGNOSIS — I5022 Chronic systolic (congestive) heart failure: Secondary | ICD-10-CM

## 2016-03-02 ENCOUNTER — Telehealth: Payer: Self-pay

## 2016-03-02 NOTE — Telephone Encounter (Signed)
Remote ICM transmission received.  Attempted patient call and left message to return call.   

## 2016-03-02 NOTE — Progress Notes (Signed)
EPIC Encounter for ICM Monitoring  Patient Name: Jason Russell is a 65 y.o. male Date: 03/02/2016 Primary Care Physican: No PCP Per Patient Primary Cardiologist:Ross Electrophysiologist: Caryl Comes Dry Weight:    unknown       Heart failure questions reviewed with patient.  He reported intermittent swelling of ankles that improves with elevation and has been feeling sluggish.  Thoracic impedance was abnormal but returned to normal on 03/01/2016.  Labs: 02/15/2016 Creatinine 1.76, BUN 25, Potassium 3.7, Sodium 142 08/16/2015 Creatinine 1.63, BUN 26, Potassium 3.5, Sodium 144  07/19/2015 Creatinine 1.56, BUN 24, Potassium 3.4, Sodium 141  12/15/2015 Creatinine 1.34, BUN 28, Potassium 3.8, Sodium 141   Recommendations: No changes.  Reinforced low salt food choices and limiting fluid intake to < 2 liters per day. Encouraged to call for fluid symptoms.    Follow-up plan: ICM clinic phone appointment on 04/04/2016.  Copy of ICM check sent to primary cardiologist and device physician.   ICM trend: 02/29/2016                Jason Billings, RN 03/02/2016 11:04 AM

## 2016-03-12 ENCOUNTER — Other Ambulatory Visit: Payer: Self-pay | Admitting: Internal Medicine

## 2016-03-26 ENCOUNTER — Other Ambulatory Visit: Payer: Self-pay | Admitting: Internal Medicine

## 2016-03-29 ENCOUNTER — Other Ambulatory Visit: Payer: Self-pay | Admitting: Internal Medicine

## 2016-03-29 NOTE — Telephone Encounter (Signed)
ferrous gluconate (FERGON) 324 MG tablet  Medication  Date: 02/15/2016 Department: [facility] Office Ordering/Authorizing: Fay Records, MD  Order Providers   Prescribing Provider Encounter Provider  Fay Records, MD Juventino Slovak, CMA  Medication Detail    Disp Refills Start End   ferrous gluconate (FERGON) 324 MG tablet 90 tablet 0 02/15/2016    Sig: TAKE 1 TABLET BY MOUTH 3 TIMES A DAY WITH MEALS*Please call and schedule an appointment with Dr Harrington Challenger*   E-Prescribing Status: Receipt confirmed by pharmacy (02/15/2016 8:32 AM EST)   Pharm[place]   [id number] [place]

## 2016-04-04 ENCOUNTER — Ambulatory Visit (INDEPENDENT_AMBULATORY_CARE_PROVIDER_SITE_OTHER): Payer: Managed Care, Other (non HMO)

## 2016-04-04 DIAGNOSIS — Z95810 Presence of automatic (implantable) cardiac defibrillator: Secondary | ICD-10-CM

## 2016-04-04 DIAGNOSIS — I5022 Chronic systolic (congestive) heart failure: Secondary | ICD-10-CM

## 2016-04-04 NOTE — Progress Notes (Signed)
EPIC Encounter for ICM Monitoring  Patient Name: Jason Russell is a 66 y.o. male Date: 04/04/2016 Primary Care Physican: No PCP Per Patient Primary Cardiologist:Ross Electrophysiologist: Caryl Comes Dry Weight:unknown                  Attempted ICM call and unable to reach.  Left detailed message regarding transmission.  Transmission reviewed.   Thoracic impedance normal but was abnormal suggesting fluid accumulation 03/19/2016 to 03/30/2016.  Labs: 02/15/2016 Creatinine 1.76, BUN 25, Potassium 3.7, Sodium 142 08/16/2015 Creatinine 1.63, BUN 26, Potassium 3.5, Sodium 144  07/19/2015 Creatinine 1.56, BUN 24, Potassium 3.4, Sodium 141  12/15/2015 Creatinine 1.34, BUN 28, Potassium 3.8, Sodium 141   Recommendations: Provided ICM direct number and encouraged to call for fluid symptoms.    Follow-up plan: ICM clinic phone appointment on 05/09/2016.  Copy of ICM check sent to device physician.   3 month ICM trend : 04/04/2016   1 Year ICM trend:      [name], RN 04/04/2016 7:48 AM

## 2016-04-06 ENCOUNTER — Telehealth: Payer: Self-pay | Admitting: Internal Medicine

## 2016-04-06 ENCOUNTER — Other Ambulatory Visit: Payer: Self-pay | Admitting: *Deleted

## 2016-04-06 DIAGNOSIS — I5022 Chronic systolic (congestive) heart failure: Secondary | ICD-10-CM

## 2016-04-06 NOTE — Telephone Encounter (Signed)
F/u Message ° °Pt returning RN call. Please call back to discuss  °

## 2016-04-07 NOTE — Telephone Encounter (Signed)
Notes Recorded by Fay Records, MD on 03/31/2016 at 9:48 PM EST Repeat BMET and BNP with Cr bump    Spoke with patient today.  He will come in on Monday for BMET and Pro BNP.  Orders placed and appointment scheduled.

## 2016-04-10 ENCOUNTER — Other Ambulatory Visit (INDEPENDENT_AMBULATORY_CARE_PROVIDER_SITE_OTHER): Payer: Self-pay

## 2016-04-10 DIAGNOSIS — I5022 Chronic systolic (congestive) heart failure: Secondary | ICD-10-CM

## 2016-04-11 LAB — SPECIMEN STATUS

## 2016-04-11 LAB — BASIC METABOLIC PANEL
BUN / CREAT RATIO: 17 (ref 10–24)
BUN: 23 mg/dL (ref 8–27)
CALCIUM: 9.4 mg/dL (ref 8.6–10.2)
CHLORIDE: 104 mmol/L (ref 96–106)
CO2: 22 mmol/L (ref 18–29)
Creatinine, Ser: 1.37 mg/dL — ABNORMAL HIGH (ref 0.76–1.27)
GFR, EST AFRICAN AMERICAN: 63 mL/min/{1.73_m2} (ref >59)
GFR, EST NON AFRICAN AMERICAN: 54 mL/min/{1.73_m2} — AB (ref >59)
Glucose: 97 mg/dL (ref 65–99)
POTASSIUM: 3.8 mmol/L (ref 3.5–5.2)
SODIUM: 143 mmol/L (ref 134–144)

## 2016-04-11 LAB — PRO B NATRIURETIC PEPTIDE: NT-Pro BNP: 3545 pg/mL — ABNORMAL HIGH (ref 0–210)

## 2016-04-13 ENCOUNTER — Telehealth: Payer: Self-pay | Admitting: *Deleted

## 2016-04-13 DIAGNOSIS — I5022 Chronic systolic (congestive) heart failure: Secondary | ICD-10-CM

## 2016-04-13 MED ORDER — POTASSIUM CHLORIDE CRYS ER 20 MEQ PO TBCR: EXTENDED_RELEASE_TABLET | ORAL | 3 refills | Status: DC

## 2016-04-13 MED ORDER — FUROSEMIDE 20 MG PO TABS: 60.0000 mg | ORAL_TABLET | ORAL | 3 refills | Status: DC

## 2016-04-13 NOTE — Telephone Encounter (Signed)
Notes Recorded by Rodman Key, RN on 04/13/2016 at 6:50 PM EST Informed patient and he verbalizes understanding. ejvery other day he will take 60 mg lasix and extra 20 mEq potassium.  [facility] [address] [facility] and refill potassium. Lab appointment 04/28/16 and clinic with Dr. Harrington Challenger on 05/11/16.

## 2016-04-13 NOTE — Telephone Encounter (Signed)
-----   Message from Fay Records, MD sent at 04/13/2016  5:20 PM EST ----- Kidney functio nis back toward baseline BUT fluid is up I would recomm Lasix 60 every other day with 20 KCL   BMET and BNP in 2 wks Pt should have f/u in clinic in the next 4 wks    Some labs not completed

## 2016-04-19 ENCOUNTER — Other Ambulatory Visit: Payer: Self-pay | Admitting: Internal Medicine

## 2016-04-20 ENCOUNTER — Other Ambulatory Visit: Payer: Self-pay | Admitting: Internal Medicine

## 2016-04-28 ENCOUNTER — Other Ambulatory Visit: Payer: Managed Care, Other (non HMO) | Admitting: *Deleted

## 2016-04-28 DIAGNOSIS — I5022 Chronic systolic (congestive) heart failure: Secondary | ICD-10-CM

## 2016-04-29 LAB — BASIC METABOLIC PANEL
BUN / CREAT RATIO: 16 (ref 10–24)
BUN: 22 mg/dL (ref 8–27)
CALCIUM: 9.1 mg/dL (ref 8.6–10.2)
CHLORIDE: 102 mmol/L (ref 96–106)
CO2: 25 mmol/L (ref 18–29)
Creatinine, Ser: 1.37 mg/dL — ABNORMAL HIGH (ref 0.76–1.27)
GFR, EST AFRICAN AMERICAN: 63 mL/min/{1.73_m2} (ref >59)
GFR, EST NON AFRICAN AMERICAN: 54 mL/min/{1.73_m2} — AB (ref >59)
Glucose: 91 mg/dL (ref 65–99)
POTASSIUM: 3.8 mmol/L (ref 3.5–5.2)
Sodium: 143 mmol/L (ref 134–144)

## 2016-04-29 LAB — PRO B NATRIURETIC PEPTIDE: NT-Pro BNP: 2565 pg/mL — ABNORMAL HIGH (ref 0–210)

## 2016-05-03 LAB — CBC WITH DIFFERENTIAL/PLATELET
BASOS: 0 %
Basophils Absolute: 0 10*3/uL (ref 0.0–0.2)
EOS (ABSOLUTE): 0.1 10*3/uL (ref 0.0–0.4)
Eos: 2 %
HEMOGLOBIN: 13.2 g/dL (ref 13.0–17.7)
Hematocrit: 40.6 % (ref 37.5–51.0)
IMMATURE GRANS (ABS): 0 10*3/uL (ref 0.0–0.1)
Immature Granulocytes: 0 %
LYMPHS ABS: 1.1 10*3/uL (ref 0.7–3.1)
LYMPHS: 21 %
MCH: 29.5 pg (ref 26.6–33.0)
MCHC: 32.5 g/dL (ref 31.5–35.7)
MCV: 91 fL (ref 79–97)
MONOCYTES: 12 %
Monocytes Absolute: 0.6 10*3/uL (ref 0.1–0.9)
NEUTROS ABS: 3.3 10*3/uL (ref 1.4–7.0)
Neutrophils: 65 %
Platelets: 175 10*3/uL (ref 150–379)
RBC: 4.48 x10E6/uL (ref 4.14–5.80)
RDW: 13.5 % (ref 12.3–15.4)
WBC: 5.1 10*3/uL (ref 3.4–10.8)

## 2016-05-03 LAB — SPECIMEN STATUS REPORT

## 2016-05-09 ENCOUNTER — Ambulatory Visit (INDEPENDENT_AMBULATORY_CARE_PROVIDER_SITE_OTHER): Payer: Managed Care, Other (non HMO) | Admitting: *Deleted

## 2016-05-09 DIAGNOSIS — Z95810 Presence of automatic (implantable) cardiac defibrillator: Secondary | ICD-10-CM

## 2016-05-09 DIAGNOSIS — I5022 Chronic systolic (congestive) heart failure: Secondary | ICD-10-CM

## 2016-05-09 DIAGNOSIS — I428 Other cardiomyopathies: Secondary | ICD-10-CM | POA: Diagnosis not present

## 2016-05-09 NOTE — Progress Notes (Signed)
Remote ICD transmission.   

## 2016-05-10 NOTE — Progress Notes (Signed)
Cardiology Office Note   Date:  05/11/2016   ID:  Jason Russell, DOB Aug 24, 1950, MRN [id number]  PCP:  No PCP Per Patient  Cardiologist:   Dorris Carnes, MD   F/U of CHF      History of Present Illness: Jason Russell is a 66 y.o. male with a history ofHTN, sleep apnea, parox AFib, systolic CHF and dilated CM. He was converted to NSR with Amiodarone (not a Tikosyn candidate with prolonged QT). EF was thought to down due to tachycardia in rapid AFib. Echo 7/13: EF 25%. Follow up echo 04/2012: Mild LVH, EF 30-35%, Gr 1 DD, mild AI, mild MR, mild LAE. Without full recovery of LVF, he was referred for cardiac cath. LHC 07/15/12: No obstructive CAD. He has a NICM. I saw the pt in Dec 2016  He was seen by [name] in April 2017    SInce I saw him He has had some edema  Better on diuretic  Dose has been adjusted  He says he urinates when he takes Lasix   Denies CP  No Palpitations  No Dizziness        No outpatient prescriptions have been marked as taking for the 05/11/16 encounter (Office Visit) with Fay Records, MD.     Allergies:   Patient has no known allergies.   Past Medical History:  Diagnosis Date  . Anemia   . Arthritis    "hx right hip"  . Asthma    "when I was a child"  . Atrial fibrillation (HCC)    Amiodarone started 10/2011; Coumadin  . 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 St. Jude ICD implantation 10/03/12.  . Chronic anticoagulation   . Chronic systolic heart failure ([place])    a. Echo 7/13: EF 25%;  b. echo 04/2012:  Mild LVH, EF 30-35%, Gr 1 DD, mild AI, mild MR, mild LAE  . COPD (chronic obstructive pulmonary disease) ([place])   . Dyslipidemia   . Gout   . History of blood transfusion 10/15/2013   "don't know where the blood's going; HgB down to 5"  . Hypertension   . NICM (nonischemic cardiomyopathy) ([place])    [place] 4/14:  minimal CAD  . Obesity   . OSA on CPAP   . Tobacco abuse     Past Surgical History:    Procedure Laterality Date  . CARDIAC DEFIBRILLATOR PLACEMENT  2014  . CARDIOVERSION  2011  . COLONOSCOPY WITH PROPOFOL Left 10/17/2013   Procedure: COLONOSCOPY WITH PROPOFOL;  Surgeon: Inda Castle, MD;  Location: [facility];  Service: Endoscopy;  Laterality: Left;  . ESOPHAGOGASTRODUODENOSCOPY N/A 10/17/2013   Procedure: ESOPHAGOGASTRODUODENOSCOPY (EGD);  Surgeon: Inda Castle, MD;  Location: [facility];  Service: Endoscopy;  Laterality: N/A;  . GIVENS CAPSULE STUDY N/A 10/29/2013   Procedure: GIVENS CAPSULE STUDY;  Surgeon: Inda Castle, MD;  Location: [facility];  Service: Endoscopy;  Laterality: N/A;  . IMPLANTABLE CARDIOVERTER DEFIBRILLATOR IMPLANT Left 10/03/2012   Procedure: IMPLANTABLE CARDIOVERTER DEFIBRILLATOR IMPLANT;  Surgeon: Deboraha Sprang, MD;  [facility];  Service: Cardiovascular;  Laterality: Left;  . JOINT REPLACEMENT    . TONSILLECTOMY  1950's  . TOTAL HIP ARTHROPLASTY Right 11/25/1997     Social History:  The patient  reports that he has been smoking Cigarettes.  He has a 11.25 pack-year smoking history. He has never used smokeless tobacco. He reports that he drinks about 0.6 oz of alcohol per week . He  reports that he does not use drugs.   Family History:  The patient's family history includes Hypertension in his mother; Lung cancer in his brother; Other in his father.    ROS:  Please see the history of present illness. All other systems are reviewed and  Negative to the above problem except as noted.    PHYSICAL EXAM: VS:  BP 128/82   Pulse 80   Ht '6\' 1"'$  (1.854 m)   Wt 281 lb (127.5 kg)   SpO2 96%   BMI 37.07 kg/m   GEN: Morbidly obese 66 yo in no acute distress  HEENT: normal  Neck: no JVD, carotid bruits, or masses Cardiac: RRR; no murmurs, rubs, or gallops,  Tr LE  edema  Respiratory:  clear to auscultation bilaterally, normal work of breathing GI: soft, nontender, nondistended, + BS  No hepatomegaly  MS: no deformity Moving all  extremities   Skin: warm and dry, no rash Neuro:  Strength and sensation are intact Psych: euthymic mood, full affect   EKG:  EKG is ordered today.  SR 71 bpm  LVH      Lipid Panel    Component Value Date/Time   CHOL 150 12/23/2012 1205   TRIG 73.0 12/23/2012 1205   HDL 46.60 12/23/2012 1205   CHOLHDL 3 12/23/2012 1205   VLDL 14.6 12/23/2012 1205   LDLCALC 89 12/23/2012 1205      Wt Readings from Last 3 Encounters:  05/11/16 281 lb (127.5 kg)  12/08/15 280 lb (127 kg)  10/07/15 292 lb (132.5 kg)      ASSESSMENT AND PLAN:  1  Paroxysmal atrial fib  On amio  Remains in SR  Continue meds   Check labs today  2  Chronic systolic CHF  VOlume status is up some  He has been worse   LUngs CTA  I would recomm check labs for renal function  Based on this adjust lasix  3  HTN  OK  I would not push meds further  Check labs    4  Shoulder  R shoulder stiff with decreased ROM  Would refer to Creig Hines    I will set to see pt in July     Current medicines are reviewed at length with the patient today.  The patient does not have concerns regarding medicines.  Signed, Dorris Carnes, MD  05/11/2016 9:00 PM    [address], [place], [place]  [address] Phone: [phone]; Fax: [phone]

## 2016-05-11 ENCOUNTER — Ambulatory Visit (INDEPENDENT_AMBULATORY_CARE_PROVIDER_SITE_OTHER): Payer: Managed Care, Other (non HMO) | Admitting: Internal Medicine

## 2016-05-11 ENCOUNTER — Encounter: Payer: Self-pay | Admitting: Internal Medicine

## 2016-05-11 ENCOUNTER — Other Ambulatory Visit: Payer: Self-pay | Admitting: Internal Medicine

## 2016-05-11 VITALS — BP 128/82 | HR 80 | Ht 73.0 in | Wt 281.0 lb

## 2016-05-11 DIAGNOSIS — I10 Essential (primary) hypertension: Secondary | ICD-10-CM

## 2016-05-11 DIAGNOSIS — I5022 Chronic systolic (congestive) heart failure: Secondary | ICD-10-CM | POA: Diagnosis not present

## 2016-05-11 DIAGNOSIS — E785 Hyperlipidemia, unspecified: Secondary | ICD-10-CM

## 2016-05-11 NOTE — Patient Instructions (Signed)
Your physician recommends that you continue on your current medications as directed. Please refer to the Current Medication list given to you today. Your physician recommends that you return for lab work (cbc, cmet, tsh, bnp)  You have been referred to [facility] physician wants you to follow-up in: July, 2018 with Dr. Harrington Challenger.  You will receive a reminder letter in the mail two months in advance. If you don't receive a letter, please call our office to schedule the follow-up appointment.

## 2016-05-11 NOTE — Progress Notes (Signed)
EPIC Encounter for ICM Monitoring  Patient Name: Jason Russell is a 66 y.o. male Date: 05/11/2016 Primary Care Physican: No PCP Per Patient Primary Cardiologist:Ross Electrophysiologist: Caryl Comes Dry Weight:279 lbs        Heart Failure questions reviewed, pt said his ankles are staying swollen and Dr Harrington Challenger checked them today.    Thoracic impedance normal.  2/1 to 2/3 were days of decreased impedance suggestion fluid.  He had labs drawn today and waiting on results.  Labs: 05/11/2016 Results Pending 04/28/2016 Creatinine 1.37, BUN 22, Potassium 3.8, Sodium 143, EGFR 54-63 04/10/2016 Creatinine 1.37, BUN 23, Potassium 3.8, Sodium 143, EGFR 54-63 02/15/2016 Creatinine 1.76, BUN 25, Potassium 3.7, Sodium 142 08/16/2015 Creatinine 1.63, BUN 26, Potassium 3.5, Sodium 144  07/19/2015 Creatinine 1.56, BUN 24, Potassium 3.4, Sodium 141  12/15/2015 Creatinine 1.34, BUN 28, Potassium 3.8, Sodium 141   Recommendations: No changes.  Encouraged to call for fluid symptoms.  Follow-up plan: ICM clinic phone appointment on 06/13/2016.  Copy of ICM check sent to primary cardiologist and device physician.   3 month ICM trend: 05/09/2016   1 Year ICM trend:      [name], RN 05/11/2016 4:51 PM

## 2016-05-12 ENCOUNTER — Encounter: Payer: Self-pay | Admitting: Cardiology

## 2016-05-12 LAB — CUP PACEART REMOTE DEVICE CHECK
Battery Remaining Longevity: 70 mo
Battery Remaining Percentage: 65 %
Brady Statistic RV Percent Paced: 1 %
Date Time Interrogation Session: 20180206090017
HIGH POWER IMPEDANCE MEASURED VALUE: 41 Ohm
HIGH POWER IMPEDANCE MEASURED VALUE: 42 Ohm
Lead Channel Impedance Value: 410 Ohm
Lead Channel Pacing Threshold Amplitude: 0.5 V
Lead Channel Sensing Intrinsic Amplitude: 11.7 mV
MDC IDC LEAD IMPLANT DT: 20140703
MDC IDC LEAD LOCATION: 753860
MDC IDC MSMT BATTERY VOLTAGE: 2.99 V
MDC IDC MSMT LEADCHNL RV PACING THRESHOLD PULSEWIDTH: 0.5 ms
MDC IDC PG IMPLANT DT: 20140703
MDC IDC PG SERIAL: 1105031
MDC IDC SET LEADCHNL RV PACING AMPLITUDE: 2.5 V
MDC IDC SET LEADCHNL RV PACING PULSEWIDTH: 0.5 ms
MDC IDC SET LEADCHNL RV SENSING SENSITIVITY: 0.5 mV

## 2016-05-12 LAB — COMPREHENSIVE METABOLIC PANEL
A/G RATIO: 1.8 (ref 1.2–2.2)
ALBUMIN: 4.2 g/dL (ref 3.6–4.8)
ALT: 8 IU/L (ref 0–44)
AST: 14 IU/L (ref 0–40)
Alkaline Phosphatase: 118 IU/L — ABNORMAL HIGH (ref 39–117)
BUN / CREAT RATIO: 15 (ref 10–24)
BUN: 25 mg/dL (ref 8–27)
Bilirubin Total: 0.4 mg/dL (ref 0.0–1.2)
CALCIUM: 9.3 mg/dL (ref 8.6–10.2)
CO2: 23 mmol/L (ref 18–29)
CREATININE: 1.67 mg/dL — AB (ref 0.76–1.27)
Chloride: 105 mmol/L (ref 96–106)
GFR calc Af Amer: 49 mL/min/{1.73_m2} — ABNORMAL LOW (ref >59)
GFR, EST NON AFRICAN AMERICAN: 43 mL/min/{1.73_m2} — AB (ref >59)
GLOBULIN, TOTAL: 2.4 g/dL (ref 1.5–4.5)
Glucose: 89 mg/dL (ref 65–99)
POTASSIUM: 4 mmol/L (ref 3.5–5.2)
SODIUM: 145 mmol/L — AB (ref 134–144)
Total Protein: 6.6 g/dL (ref 6.0–8.5)

## 2016-05-12 LAB — CBC
HEMATOCRIT: 41.7 % (ref 37.5–51.0)
HEMOGLOBIN: 13 g/dL (ref 13.0–17.7)
MCH: 28.3 pg (ref 26.6–33.0)
MCHC: 31.2 g/dL — ABNORMAL LOW (ref 31.5–35.7)
MCV: 91 fL (ref 79–97)
Platelets: 171 10*3/uL (ref 150–379)
RBC: 4.59 x10E6/uL (ref 4.14–5.80)
RDW: 14.4 % (ref 12.3–15.4)
WBC: 5.6 10*3/uL (ref 3.4–10.8)

## 2016-05-12 LAB — TSH: TSH: 2.08 u[IU]/mL (ref 0.450–4.500)

## 2016-05-12 LAB — PRO B NATRIURETIC PEPTIDE: NT-Pro BNP: 3049 pg/mL — ABNORMAL HIGH (ref 0–210)

## 2016-05-19 ENCOUNTER — Other Ambulatory Visit: Payer: Self-pay | Admitting: *Deleted

## 2016-05-19 DIAGNOSIS — M25519 Pain in unspecified shoulder: Secondary | ICD-10-CM

## 2016-05-24 ENCOUNTER — Other Ambulatory Visit: Payer: Self-pay | Admitting: Internal Medicine

## 2016-06-03 NOTE — Progress Notes (Signed)
[name] [address] [place], [place] [address] Phone: [phone] Subjective:    I'm seeing this patient by the request  of:  Dr. Harrington Challenger  CC:  Right Shoulder pain  TDV:[id number]  Jason Russell is a 66 y.o. male coming in with complaint of shoulder pain. Seems to be right-sided. Been going on 6 months. Does not remember any true injury. States though that unfortunately he has no strength at all. Patient is now retired and wants to do more activity but is finding it difficult because of the loss of strength. Patient states that the pain is actually fairly well controlled at the moment. Patient denies any significant radiation or any weakness of the hand. Patient though states that there is unable to do such things as even dressing himself regularly secondary to the weakness.     Past Medical History:  Diagnosis Date  . Anemia   . Arthritis    "hx right hip"  . Asthma    "when I was a child"  . Atrial fibrillation (HCC)    Amiodarone started 10/2011; Coumadin  . 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 St. Jude ICD implantation 10/03/12.  . Chronic anticoagulation   . Chronic systolic heart failure ([place])    a. Echo 7/13: EF 25%;  b. echo 04/2012:  Mild LVH, EF 30-35%, Gr 1 DD, mild AI, mild MR, mild LAE  . COPD (chronic obstructive pulmonary disease) ([place])   . Dyslipidemia   . Gout   . History of blood transfusion 10/15/2013   "don't know where the blood's going; HgB down to 5"  . Hypertension   . NICM (nonischemic cardiomyopathy) ([place])    [place] 4/14:  minimal CAD  . Obesity   . OSA on CPAP   . Tobacco abuse    Past Surgical History:  Procedure Laterality Date  . CARDIAC DEFIBRILLATOR PLACEMENT  2014  . CARDIOVERSION  2011  . COLONOSCOPY WITH PROPOFOL Left 10/17/2013   Procedure: COLONOSCOPY WITH PROPOFOL;  Surgeon: Inda Castle, MD;  Location: [facility];  Service: Endoscopy;  Laterality: Left;  .  ESOPHAGOGASTRODUODENOSCOPY N/A 10/17/2013   Procedure: ESOPHAGOGASTRODUODENOSCOPY (EGD);  Surgeon: Inda Castle, MD;  Location: [facility];  Service: Endoscopy;  Laterality: N/A;  . GIVENS CAPSULE STUDY N/A 10/29/2013   Procedure: GIVENS CAPSULE STUDY;  Surgeon: Inda Castle, MD;  Location[facility];  Service: Endoscopy;  Laterality: N/A;  . IMPLANTABLE CARDIOVERTER DEFIBRILLATOR IMPLANT Left 10/03/2012   Procedure: IMPLANTABLE CARDIOVERTER DEFIBRILLATOR IMPLANT;  Surgeon: Deboraha Sprang, MD;  Location: [facility] CATH LAB;  Service: Cardiovascular;  Laterality: Left;  . JOINT REPLACEMENT    . TONSILLECTOMY  1950's  . TOTAL HIP ARTHROPLASTY Right 11/25/1997   Social History   Social History  . Marital status: Married    Spouse name: N/A  . Number of children: 1  . Years of education: N/A   Occupational History  . [employer] - Freight forwarder [employer]   Social History Main Topics  . Smoking status: Current Every Day Smoker    Packs/day: 0.25    Years: 45.00    Types: Cigarettes  . Smokeless tobacco: Never Used  . Alcohol use 0.6 oz/week    1 Cans of beer per week  . Drug use: No  . Sexual activity: Not Currently   Other Topics Concern  . None   Social History Narrative  . None   No Known Allergies Family  History  Problem Relation Age of Onset  . Hypertension Mother   . Other Father     deceased- unknow reason  . Lung cancer Brother     Past medical history, social, surgical and family history all reviewed in electronic medical record.  No pertanent information unless stated regarding to the chief complaint.   Review of Systems:Review of systems updated and as accurate as of 06/05/16  No headache, visual changes, nausea, vomiting, diarrhea, constipation, dizziness, abdominal pain, skin rash, fevers, chills, night sweats, weight loss, swollen lymph nodes, body aches, joint swelling, chest pain, shortness of breath, mood changes. Positive for muscle  weakness  Objective  Blood pressure (!) 138/100, pulse 72, height '6\' 1"'$  (1.854 m), weight 291 lb (132 kg). Systems examined below as of 06/05/16   General: No apparent distress alert and oriented x3 mood and affect normal, dressed appropriately.  HEENT: Pupils equal, extraocular movements intact  Respiratory: Patient's speak in full sentences and does not appear short of breath  Cardiovascular: No lower extremity edema, non tender, no erythema  Skin: Warm dry intact with no signs of infection or rash on extremities or on axial skeleton.  Abdomen: Soft nontender  Neuro: Cranial nerves II through XII are intact, neurovascularly intact in all extremities with 2+ DTRs and 2+ pulses.  Lymph: No lymphadenopathy of posterior or anterior cervical chain or axillae bilaterally.  Gait normal with good balance and coordination.  MSK:  Non tender with full range of motion and good stability and symmetric strength and tone of , elbows, wrist, hip, knee and ankles bilaterally. Mild arthritic changes of multiple joints  Shoulder: Right Severe atrophy of the shoulder musculature noted ROM is full in all planes passively. No movement actively except for internal rotation Rotator cuff strength to 5 signs of impingement with positive Neer and Hawkin's tests, but negative empty can sign. Positive drop arm sign No apprehension sign Contralateral shoulder unremarkable  MSK US performed of: Right This study was ordered, performed, and interpreted by Charlann Boxer D.O.  Shoulder:   Supraspinatus:  Severe atrophy noted. Mild arthritic changes noted. Does appear to have near full-thickness tear noted. Subscapularis:  Full-thickness tear with 1 cm of retraction noted Teres Minor:  Appears normal on long and transverse views. AC joint:  Mild to moderate arthritis. Glenohumeral Joint:  Mild to moderate arthritis Glenoid Labrum:  Intact without visualized tears. Biceps Tendon:  Appears normal on long and  transverse views, no fraying of tendon, tendon located in intertubercular groove, no subluxation with shoulder internal or external rotation.  Impression: Full-thickness tear of the rotator cuff      Impression and Recommendations:     This case required medical decision making of moderate complexity.      Note: This dictation was prepared with Dragon dictation along with smaller phrase technology. Any transcriptional errors that result from this process are unintentional.

## 2016-06-05 ENCOUNTER — Encounter: Payer: Self-pay | Admitting: Family Medicine

## 2016-06-05 ENCOUNTER — Ambulatory Visit: Payer: Self-pay

## 2016-06-05 ENCOUNTER — Ambulatory Visit (INDEPENDENT_AMBULATORY_CARE_PROVIDER_SITE_OTHER)
Admission: RE | Admit: 2016-06-05 | Discharge: 2016-06-05 | Disposition: A | Discharge status: Home / Self Care | Payer: Managed Care, Other (non HMO) | Source: Ambulatory Visit | Attending: Family Medicine | Admitting: Family Medicine

## 2016-06-05 ENCOUNTER — Ambulatory Visit (INDEPENDENT_AMBULATORY_CARE_PROVIDER_SITE_OTHER): Payer: Managed Care, Other (non HMO) | Admitting: Family Medicine

## 2016-06-05 VITALS — BP 138/100 | HR 72 | Ht 73.0 in | Wt 291.0 lb

## 2016-06-05 DIAGNOSIS — M25511 Pain in right shoulder: Secondary | ICD-10-CM

## 2016-06-05 DIAGNOSIS — M75121 Complete rotator cuff tear or rupture of right shoulder, not specified as traumatic: Secondary | ICD-10-CM

## 2016-06-05 DIAGNOSIS — M75101 Unspecified rotator cuff tear or rupture of right shoulder, not specified as traumatic: Secondary | ICD-10-CM | POA: Insufficient documentation

## 2016-06-05 NOTE — Patient Instructions (Signed)
Good to see you  We will get xray today  I am trying to get an MRI as well we will contact you.  In the meantime pennsaid pinkie amount topically 2 times daily as needed.  Ice 20 minutes 2 times daily. Usually after activity and before bed. Vitamin D 2000 IU daily Tart cherry extract any dose at night See me again 2-3 days after the MRI  Or whatever test we decide and we can go over it.  Thanks

## 2016-06-05 NOTE — Assessment & Plan Note (Signed)
Patient does have what appears to be a full rotator cuff tear noted. Patient is not having as much pain but unfortunately has no significant strength whatsoever. Patient's ultrasound does show full-thickness tear with retraction of the subscapularis but difficult to see what the supraspinatus. X-rays today to further evaluate the amount of arthritis that could be contribute as well. Depending on findings patient may need advance imaging. Patient does have a defibrillator but new evidence shows that this may be safe for an MRI. We'll discuss with imaging if appropriate. Patient and will come back and see me again if we do date the advance imaging did discuss further treatment options.

## 2016-06-13 ENCOUNTER — Ambulatory Visit (INDEPENDENT_AMBULATORY_CARE_PROVIDER_SITE_OTHER): Payer: Managed Care, Other (non HMO)

## 2016-06-13 DIAGNOSIS — I5022 Chronic systolic (congestive) heart failure: Secondary | ICD-10-CM

## 2016-06-13 DIAGNOSIS — Z95810 Presence of automatic (implantable) cardiac defibrillator: Secondary | ICD-10-CM | POA: Diagnosis not present

## 2016-06-13 NOTE — Progress Notes (Signed)
EPIC Encounter for ICM Monitoring  Patient Name: Jason Russell is a 66 y.o. male Date: 06/13/2016 Primary Care Physican: No PCP Per Patient Primary Cardiologist:Ross Electrophysiologist: Caryl Comes Dry Weight:  unknown        Heart Failure questions reviewed, pt asymptomatic    Thoracic impedance almost at baseline today but was abnormal suggesting fluid accumulation from 06/02/2016 to 06/12/2016.  Prescribed and confirmed dosage: Furosemide 20 mg 3 tablets (60 mg total) every other day.  Potassium 20 mEq 1 tablet every day. On days when lasix is taken, take 2 tablets  Labs: 05/11/2016 Creatinine 1.67, BUN 25, Potassium 4.0, Sodium 145, EGFR 43-49 04/28/2016 Creatinine 1.37, BUN 22, Potassium 3.8, Sodium 143, EGFR 54-63 04/10/2016 Creatinine 1.37, BUN 23, Potassium 3.8, Sodium 143, EGFR 54-63 02/15/2016 Creatinine 1.76, BUN 25, Potassium 3.7, Sodium 142 08/16/2015 Creatinine 1.63, BUN 26, Potassium 3.5, Sodium 144  07/19/2015 Creatinine 1.56, BUN 24, Potassium 3.4, Sodium 141  12/15/2015 Creatinine 1.34, BUN 28, Potassium 3.8, Sodium 141   Recommendations:  Advised to limit fluids to 64 oz a day = 0.5 gallon and he has been drinking 1 gallon of fluid a day.    Follow-up plan: ICM clinic phone appointment on 08/08/2016 since he has a defib check office appointment on 07/21/2016 with Dr Caryl Comes  Copy of ICM check sent to primary cardiologist and device physician.   3 month ICM trend: 06/13/2016   1 Year ICM trend:      [name], RN 06/13/2016 6:09 PM

## 2016-06-14 NOTE — Progress Notes (Signed)
Agree with cutting back on fluid intake If he does this he needs to have BMET and BNP check in 10 days

## 2016-06-22 ENCOUNTER — Telehealth: Payer: Self-pay | Admitting: *Deleted

## 2016-06-22 DIAGNOSIS — I5022 Chronic systolic (congestive) heart failure: Secondary | ICD-10-CM

## 2016-06-22 NOTE — Telephone Encounter (Signed)
Spoke with patient.  He will come on 3/26 for lab work.  Advised to continue same medicines and plan until after those results are in.

## 2016-06-22 NOTE — Telephone Encounter (Signed)
-----   Message from Fay Records, MD sent at 06/14/2016  2:13 PM EDT -----   ----- Message ----- From: [name], RN Sent: 06/13/2016   6:20 PM To: Fay Records, MD

## 2016-06-22 NOTE — Telephone Encounter (Signed)
Notes recorded by Sharman Cheek, RN:  Thoracic impedance almost at baseline today but was abnormal suggesting fluid accumulation from 06/02/2016 to 06/12/2016.  Prescribed and confirmed dosage: Furosemide 20 mg 3 tablets (60 mg total) every other day.  Potassium 20 mEq 1 tablet every day. On days when lasix is taken, take 2 tablets Recommendations:  Advised to limit fluids to 64 oz a day = 0.5 gallon and he has been drinking 1 gallon of fluid a day.      Fay Records, MD at 06/13/2016 2:00 PM   Status: Signed    Agree with cutting back on fluid intake If he does this he needs to have BMET and BNP check in 10 days        Left detailed message on patient's voce mail to call back and schedule lab work for tomorrow or Monday 3/23 or 3/26.  Order placed for BNP and BMET.

## 2016-06-26 ENCOUNTER — Other Ambulatory Visit: Payer: Managed Care, Other (non HMO) | Admitting: *Deleted

## 2016-06-26 DIAGNOSIS — I5022 Chronic systolic (congestive) heart failure: Secondary | ICD-10-CM

## 2016-06-27 ENCOUNTER — Telehealth: Payer: Self-pay | Admitting: Pediatrics

## 2016-06-27 DIAGNOSIS — I5022 Chronic systolic (congestive) heart failure: Secondary | ICD-10-CM

## 2016-06-27 LAB — BASIC METABOLIC PANEL
BUN/Creatinine Ratio: 15 (ref 10–24)
BUN: 22 mg/dL (ref 8–27)
CALCIUM: 9.2 mg/dL (ref 8.6–10.2)
CO2: 27 mmol/L (ref 18–29)
CREATININE: 1.5 mg/dL — AB (ref 0.76–1.27)
Chloride: 103 mmol/L (ref 96–106)
GFR, EST AFRICAN AMERICAN: 56 mL/min/{1.73_m2} — AB (ref >59)
GFR, EST NON AFRICAN AMERICAN: 49 mL/min/{1.73_m2} — AB (ref >59)
Glucose: 99 mg/dL (ref 65–99)
POTASSIUM: 3.8 mmol/L (ref 3.5–5.2)
SODIUM: 143 mmol/L (ref 134–144)

## 2016-06-27 LAB — PRO B NATRIURETIC PEPTIDE: NT-Pro BNP: 1996 pg/mL — ABNORMAL HIGH (ref 0–210)

## 2016-06-27 NOTE — Telephone Encounter (Signed)
-----   Message from [place], MD sent at 06/27/2016 11:07 AM EDT ----- FLuid is better  I would for a while continue taking lasix 3 tabs every other day and on alternate days take 2 tabs   F/U BMET 2 wks   Pt has appt in EP clinic [name] ) in 2 wks

## 2016-06-27 NOTE — Telephone Encounter (Signed)
I spoke to patient and advised of results and recommendations.  I advised of f/u labs, EP appt, and medications changes. I advised him to come early for appt w/ Dr. Caryl Comes and have f/u BMET drawn.  He gave verbal read back of medication instructions, voiced understanding of recommendations, and agreed with plan.  Orders entered and linked to appt.

## 2016-06-29 ENCOUNTER — Other Ambulatory Visit: Payer: Self-pay | Admitting: Family Medicine

## 2016-06-29 DIAGNOSIS — Z716 Tobacco abuse counseling: Secondary | ICD-10-CM

## 2016-07-03 ENCOUNTER — Encounter: Payer: Self-pay | Admitting: Internal Medicine

## 2016-07-05 ENCOUNTER — Ambulatory Visit: Payer: Managed Care, Other (non HMO)

## 2016-07-11 ENCOUNTER — Ambulatory Visit
Admission: RE | Admit: 2016-07-11 | Discharge: 2016-07-11 | Disposition: A | Discharge status: Home / Self Care | Payer: Managed Care, Other (non HMO) | Source: Ambulatory Visit | Attending: Family Medicine | Admitting: Family Medicine

## 2016-07-11 DIAGNOSIS — Z716 Tobacco abuse counseling: Secondary | ICD-10-CM

## 2016-07-17 ENCOUNTER — Other Ambulatory Visit: Payer: Managed Care, Other (non HMO)

## 2016-07-17 ENCOUNTER — Encounter: Payer: Managed Care, Other (non HMO) | Admitting: Internal Medicine

## 2016-07-19 ENCOUNTER — Other Ambulatory Visit: Payer: Self-pay | Admitting: Internal Medicine

## 2016-07-21 ENCOUNTER — Other Ambulatory Visit: Payer: Managed Care, Other (non HMO)

## 2016-07-21 ENCOUNTER — Encounter: Payer: Managed Care, Other (non HMO) | Admitting: Internal Medicine

## 2016-07-21 ENCOUNTER — Ambulatory Visit: Payer: Managed Care, Other (non HMO) | Admitting: Pulmonary Disease

## 2016-07-21 ENCOUNTER — Encounter: Payer: Self-pay | Admitting: Pulmonary Disease

## 2016-07-21 ENCOUNTER — Ambulatory Visit (INDEPENDENT_AMBULATORY_CARE_PROVIDER_SITE_OTHER): Payer: Managed Care, Other (non HMO) | Admitting: Pulmonary Disease

## 2016-07-21 VITALS — BP 126/78 | HR 73 | Ht 73.0 in | Wt 282.0 lb

## 2016-07-21 DIAGNOSIS — R911 Solitary pulmonary nodule: Secondary | ICD-10-CM | POA: Diagnosis not present

## 2016-07-21 DIAGNOSIS — J430 Unilateral pulmonary emphysema [MacLeod's syndrome]: Secondary | ICD-10-CM | POA: Diagnosis not present

## 2016-07-21 DIAGNOSIS — G4733 Obstructive sleep apnea (adult) (pediatric): Secondary | ICD-10-CM

## 2016-07-21 DIAGNOSIS — I5022 Chronic systolic (congestive) heart failure: Secondary | ICD-10-CM

## 2016-07-21 NOTE — Progress Notes (Signed)
Subjective:    Patient ID: Juriel Cid, male    DOB: 02/26/1951, 66 y.o.   MRN: [id number]  HPI    This is the case of Leah Skora, 66 y.o. Male, who was referred by Dr. Lucianne Lei  in consultation regarding lung nodule.   As you very well know, patient Is known to have congestive heart failure with an EF of 25-30%, known to have NICM, has a 71 PY smoking history, also has COPD but is not on meds.  Patient has severe OSA with AHI of 33 and is compliant on autocpap 5-20 cm water.   He had a lung CA screening ct scan which was abnormal.    He remains to have fair functional capacity. He takes care of wife.   He is on chronic [place] for CAD and afib. On xarelto 1 yr now.      Review of Systems  Constitutional: Negative.  Negative for fever and unexpected weight change.  HENT: Negative.  Negative for congestion, dental problem, ear pain, nosebleeds, postnasal drip, rhinorrhea, sinus pressure, sneezing, sore throat and trouble swallowing.   Eyes: Negative.  Negative for redness and itching.  Respiratory: Negative.  Negative for cough, chest tightness, shortness of breath and wheezing.   Cardiovascular: Negative.  Negative for palpitations and leg swelling.  Gastrointestinal: Negative.  Negative for nausea and vomiting.  Endocrine: Negative.   Genitourinary: Negative.  Negative for dysuria.  Musculoskeletal: Negative.  Negative for joint swelling.  Skin: Negative.  Negative for rash.  Allergic/Immunologic: Negative.  Negative for environmental allergies, food allergies and immunocompromised state.  Neurological: Negative for headaches.  Hematological: Negative.  Does not bruise/bleed easily.  Psychiatric/Behavioral: Negative.  Negative for dysphoric mood. The patient is not nervous/anxious.    Past Medical History:  Diagnosis Date  . Anemia   . Arthritis    "hx right hip"  . Asthma    "when I was a child"  . Atrial fibrillation (HCC)    Amiodarone started 10/2011;  Coumadin  . 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 St. Jude ICD implantation 10/03/12.  . Chronic anticoagulation   . Chronic systolic heart failure ([place])    a. Echo 7/13: EF 25%;  b. echo 04/2012:  Mild LVH, EF 30-35%, Gr 1 DD, mild AI, mild MR, mild LAE  . COPD (chronic obstructive pulmonary disease) ([place])   . Dyslipidemia   . Gout   . History of blood transfusion 10/15/2013   "don't know where the blood's going; HgB down to 5"  . Hyperlipidemia   . Hypertension   . NICM (nonischemic cardiomyopathy) ([place])    [place] 4/14:  minimal CAD  . Obesity   . OSA on CPAP   . Tobacco abuse    (-) CA, DVT  Family History  Problem Relation Age of Onset  . Hypertension Mother   . Other Father     deceased- unknow reason  . Lung cancer Brother      Past Surgical History:  Procedure Laterality Date  . CARDIAC DEFIBRILLATOR PLACEMENT  2014  . CARDIOVERSION  2011  . COLONOSCOPY WITH PROPOFOL Left 10/17/2013   Procedure: COLONOSCOPY WITH PROPOFOL;  Surgeon: Inda Castle, MD;  Location: [facility];  Service: Endoscopy;  Laterality: Left;  . ESOPHAGOGASTRODUODENOSCOPY N/A 10/17/2013   Procedure: ESOPHAGOGASTRODUODENOSCOPY (EGD);  Surgeon: Inda Castle, MD;  Location: [facility];  Service: Endoscopy;  Laterality: N/A;  . GIVENS CAPSULE STUDY N/A 10/29/2013   Procedure: GIVENS CAPSULE  STUDY;  Surgeon: Inda Castle, MD;  Location: [facility];  Service: Endoscopy;  Laterality: N/A;  . IMPLANTABLE CARDIOVERTER DEFIBRILLATOR IMPLANT Left 10/03/2012   Procedure: IMPLANTABLE CARDIOVERTER DEFIBRILLATOR IMPLANT;  Surgeon: Deboraha Sprang, MD;  [facility] CATH LAB;  Service: Cardiovascular;  Laterality: Left;  . JOINT REPLACEMENT    . TONSILLECTOMY  1950's  . TOTAL HIP ARTHROPLASTY Right 11/25/1997    Social History   Social History  . Marital status: Married    Spouse name: N/A  . Number of children: 1  . Years of education: N/A   Occupational History  .  [employer] - Freight forwarder [employer]   Social History Main Topics  . Smoking status: Former Smoker    Packs/day: 0.25    Years: 45.00    Types: Cigarettes    Quit date: 07/11/2016  . Smokeless tobacco: Never Used  . Alcohol use 0.6 oz/week    1 Cans of beer per week  . Drug use: No  . Sexual activity: Not Currently   Other Topics Concern  . Not on file   Social History Narrative  . No narrative on file     No Known Allergies   Outpatient Medications Prior to Visit  Medication Sig Dispense Refill  . amiodarone (PACERONE) 100 MG tablet Take 1 tablet (100 mg total) by mouth daily. 90 tablet 2  . atorvastatin (LIPITOR) 20 MG tablet TAKE 1 TABLET BY MOUTH EVERY DAY 90 tablet 3  . buPROPion (WELLBUTRIN SR) 150 MG 12 hr tablet TAKE 1 TABLET (150 MG TOTAL) BY MOUTH 2 (TWO) TIMES DAILY. 180 tablet 2  . carvedilol (COREG) 12.5 MG tablet TAKE 1 TABLET (12.5 MG TOTAL) BY MOUTH 2 (TWO) TIMES DAILY. 180 tablet 3  . colchicine 0.6 MG tablet Take 1 tablet by mouth as needed (gout flares).     . ferrous gluconate (FERGON) 324 MG tablet Take 1 tablet (324 mg total) by mouth 3 (three) times daily with meals. 90 tablet 8  . furosemide (LASIX) 20 MG tablet Take 3 tablets (60 mg total) by mouth every other day. 135 tablet 3  . hydrALAZINE (APRESOLINE) 100 MG tablet Take 1 tablet by mouth 3 (three) times daily.  1  . levothyroxine (SYNTHROID, LEVOTHROID) 50 MCG tablet TAKE 1 TABLET (50 MCG TOTAL) BY MOUTH DAILY BEFORE BREAKFAST. 90 tablet 3  . lisinopril (PRINIVIL,ZESTRIL) 40 MG tablet TAKE 1 TABLET BY MOUTH DAILY. 90 tablet 1  . potassium chloride SA (KLOR-CON M20) 20 MEQ tablet Take 1 tablet every day.  On days when lasix is taken, take 2 tablets 135 tablet 3  . spironolactone (ALDACTONE) 25 MG tablet TAKE 0.5 TABLETS (12.5 MG TOTAL) BY MOUTH DAILY. 45 tablet 1  . ULORIC 40 MG tablet Take 1 tablet by mouth daily.    . Vitamin D, Ergocalciferol, (DRISDOL) 50000 units CAPS capsule Take 1  capsule by mouth once a week.    [name] 20 MG TABS tablet TAKE 1 TABLET BY MOUTH DAILY. 90 tablet 2  . sildenafil (REVATIO) 20 MG tablet Take 1 tablet (20 mg total) by mouth at bedtime. Take 2-3 tablets daily prn for ED (Patient not taking: Reported on 07/21/2016) 30 tablet 2   No facility-administered medications prior to visit.    No orders of the defined types were placed in this encounter.            Objective:   Physical Exam  Vitals:  Vitals:   07/21/16 5102  BP: 126/78  Pulse: 73  SpO2: 98%  Weight: 282 lb (127.9 kg)  Height: '6\' 1"'$  (1.854 m)    Constitutional/General:  Pleasant, well-nourished, well-developed, not in any distress,  Comfortably seating.  Well kempt  Body mass index is 37.21 kg/m. Wt Readings from Last 3 Encounters:  07/21/16 282 lb (127.9 kg)  06/05/16 291 lb (132 kg)  05/11/16 281 lb (127.5 kg)     HEENT: Pupils equal and reactive to light and accommodation. Anicteric sclerae. Normal nasal mucosa.   No oral  lesions,  mouth clear,  oropharynx clear, no postnasal drip. (-) Oral thrush. No dental caries.  Airway - Mallampati class III  Neck: No masses. Midline trachea. No JVD, (-) LAD. (-) bruits appreciated.  Respiratory/Chest: Grossly normal chest. (-) deformity. (-) Accessory muscle use.  Symmetric expansion. (-) Tenderness on palpation.  Resonant on percussion.  Diminished BS on both lower lung zones. (-) wheezing, crackles, rhonchi (-) egophony  Cardiovascular: Regular rate and  rhythm, heart sounds normal, no murmur or gallops, no peripheral edema  Gastrointestinal:  Normal bowel sounds. Soft, non-tender. No hepatosplenomegaly.  (-) masses.   Musculoskeletal:  Normal muscle tone. Normal gait.   Extremities: Grossly normal. (-) clubbing, cyanosis.  (-) edema  Skin: (-) rash,lesions seen.   Neurological/Psychiatric : alert, oriented to time, place, person. Normal mood and affect          Assessment & Plan:  Lung  nodule Patient with 50-pack-year smoking history at least, quit a week ago. He had a lung cancer screening CAT scan which showed a suspicious 13 mm right upper lung nodule as well as a right hilar and right subcarinal lymphadenopathy.  He has a lot of comorbidities with nonischemic cardiomyopathy, EF 30%, chronic atrial fibrillation, COPD (not on meds), severe OSA compliant on cpap.   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  Incidental right shoulder rotator cuff cough tendinitis the last year. 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  CT scan images reviewed with the patient.  Plan : 1. Patient will need a PET CT scan. 2. Patient will need a bronchoscopy with EBUS. We need to coordinate. 3. Patient will also need a CT scan guided biopsy of lung nodule if the bronchoscopy and biopsy of the lymph nodes will be negative. I'm not sure if the nodule can be reached through bronchoscopy. 4. I need to touch base with his cardiologist, Dr. Harrington Challenger, for clearance and determine whether he can be off his anticoagulation. 5. I extensively discussed my differentials and plan with the patient. He understands most likely this is malignancy. He understands we need a biopsy. He understands he needs to be off the anticoagulation because his increased risk of bleeding when he he is on the medicine with a biopsy. He understands the risks and possible complications of procedure and wants to proceed with it.  COPD (chronic obstructive pulmonary disease) ([place]) Pt with significant smoking history. Likely moderate COPD.  He has fair fxnal capacity.  He takes his time. (also with cardiac issues).  Plan to get PFTs. Pt wants to hold off on maintenance meds.  Cont albuterol MDI as needed.  Need to discuss more re: COPD on follow up.  He was pre occupied with the lung  nodule.   Obstructive sleep apnea Pt with severe OSA with AHI  33 and he has good compliance and download with autocpap 5-20 cm water.   Cont autocpap for now.      Thank you very much for letting me participate in this patient's care. Please do not hesitate to give me a call if you have any questions or concerns regarding the treatment plan.   Patient will follow up with [place] in 4 weeks.     Monica Becton, MD 07/22/2016   2:22 AM Pulmonary and [place] Pager: [phone] Office: [phone], Fax: [phone]

## 2016-07-21 NOTE — Assessment & Plan Note (Signed)
Patient with 50-pack-year smoking history at least, quit a week ago. He had a lung cancer screening CAT scan which showed a suspicious 13 mm right upper lung nodule as well as a right hilar and right subcarinal lymphadenopathy.  He has a lot of comorbidities with nonischemic cardiomyopathy, EF 30%, chronic atrial fibrillation, COPD (not on meds), severe OSA compliant on cpap.   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  Incidental right shoulder rotator cuff cough tendinitis the last year. 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  CT scan images reviewed with the patient.  Plan : 1. Patient will need a PET CT scan. 2. Patient will need a bronchoscopy with EBUS. We need to coordinate. 3. Patient will also need a CT scan guided biopsy of lung nodule if the bronchoscopy and biopsy of the lymph nodes will be negative. I'm not sure if the nodule can be reached through bronchoscopy. 4. I need to touch base with his cardiologist, Dr. Harrington Challenger, for clearance and determine whether he can be off his anticoagulation. 5. I extensively discussed my differentials and plan with the patient. He understands most likely this is malignancy. He understands we need a biopsy. He understands he needs to be off the anticoagulation because his increased risk of bleeding when he he is on the medicine with a biopsy. He understands the risks and possible complications of procedure and wants to proceed with it.

## 2016-07-21 NOTE — Patient Instructions (Signed)
  It was a pleasure taking care of you today!  We will schedule you for a PET scan. We will also schedule you for a bronchoscopy. We will call you once we have a date on bronchoscopy.  We will need blood work today.   Return to clinic in 4 weeks  with Dr. Corrie Dandy or NP/APP

## 2016-07-22 ENCOUNTER — Telehealth: Payer: Self-pay | Admitting: Pulmonary Disease

## 2016-07-22 DIAGNOSIS — J430 Unilateral pulmonary emphysema [MacLeod's syndrome]: Secondary | ICD-10-CM

## 2016-07-22 LAB — BASIC METABOLIC PANEL
BUN/Creatinine Ratio: 16 (ref 10–24)
BUN: 28 mg/dL — AB (ref 8–27)
CO2: 28 mmol/L (ref 18–29)
CREATININE: 1.76 mg/dL — AB (ref 0.76–1.27)
Calcium: 9.6 mg/dL (ref 8.6–10.2)
Chloride: 102 mmol/L (ref 96–106)
GFR, EST AFRICAN AMERICAN: 46 mL/min/{1.73_m2} — AB (ref >59)
GFR, EST NON AFRICAN AMERICAN: 40 mL/min/{1.73_m2} — AB (ref >59)
Glucose: 80 mg/dL (ref 65–99)
Potassium: 3.4 mmol/L — ABNORMAL LOW (ref 3.5–5.2)
Sodium: 146 mmol/L — ABNORMAL HIGH (ref 134–144)

## 2016-07-22 LAB — CBC WITH DIFFERENTIAL/PLATELET
BASOS ABS: 0 10*3/uL (ref 0.0–0.2)
BASOS: 0 %
EOS (ABSOLUTE): 0.1 10*3/uL (ref 0.0–0.4)
Eos: 2 %
HEMOGLOBIN: 12.1 g/dL — AB (ref 13.0–17.7)
Hematocrit: 37.1 % — ABNORMAL LOW (ref 37.5–51.0)
IMMATURE GRANS (ABS): 0 10*3/uL (ref 0.0–0.1)
Immature Granulocytes: 0 %
LYMPHS: 19 %
Lymphocytes Absolute: 1 10*3/uL (ref 0.7–3.1)
MCH: 29.5 pg (ref 26.6–33.0)
MCHC: 32.6 g/dL (ref 31.5–35.7)
MCV: 91 fL (ref 79–97)
MONOCYTES: 14 %
Monocytes Absolute: 0.8 10*3/uL (ref 0.1–0.9)
NEUTROS ABS: 3.6 10*3/uL (ref 1.4–7.0)
Neutrophils: 65 %
Platelets: 182 10*3/uL (ref 150–379)
RBC: 4.1 x10E6/uL — ABNORMAL LOW (ref 4.14–5.80)
RDW: 13.9 % (ref 12.3–15.4)
WBC: 5.5 10*3/uL (ref 3.4–10.8)

## 2016-07-22 LAB — PROTIME-INR
INR: 1 (ref 0.8–1.2)
PROTHROMBIN TIME: 10.4 s (ref 9.1–12.0)

## 2016-07-22 LAB — APTT: APTT: 25 s (ref 24–33)

## 2016-07-22 NOTE — Telephone Encounter (Signed)
Jasmine,  I forgot to order PFT for this pt.  Can we pls do full PFTs soon? Thanks! pls let me know when it can be done.  Also, when is his pet scan scheduled?    J. Shirl Harris, MD 07/22/2016, 3:02 AM [place] [facility] Pager [phone] After 3 pm or if no answer, call [phone]

## 2016-07-22 NOTE — Assessment & Plan Note (Signed)
Pt with significant smoking history. Likely moderate COPD.  He has fair fxnal capacity.  He takes his time. (also with cardiac issues).  Plan to get PFTs. Pt wants to hold off on maintenance meds.  Cont albuterol MDI as needed.  Need to discuss more re: COPD on follow up.  He was pre occupied with the lung nodule.

## 2016-07-22 NOTE — Assessment & Plan Note (Signed)
Pt with severe OSA with AHI 33 and he has good compliance and download with autocpap 5-20 cm water.   Cont autocpap for now.

## 2016-07-24 NOTE — Telephone Encounter (Signed)
AD  Pt's PET scan is 4/30. Spoke with pt and he agreed to the PFT will see if he can have PFT on same day per pt's request.

## 2016-07-25 ENCOUNTER — Telehealth: Payer: Self-pay | Admitting: Internal Medicine

## 2016-07-25 MED ORDER — FUROSEMIDE 20 MG PO TABS: ORAL_TABLET | ORAL | 6 refills | Status: DC

## 2016-07-25 NOTE — Telephone Encounter (Signed)
Reviewed labs and recommendations by Dr. Harrington Challenger.  Pt voice understanding.   Pt will change lasix to 60 mg every tue and Saturday, 40 mg every other day.

## 2016-07-25 NOTE — Telephone Encounter (Signed)
Spoke with pt and informed him of AD's message. Pt understood and had no further questions.

## 2016-07-25 NOTE — Telephone Encounter (Signed)
New Message ° ° pt verbalized that he is returning call for rn  °

## 2016-07-25 NOTE — Telephone Encounter (Signed)
   Jasmine,  Thank you for calling pt.  Pls tell him Dr. Ashok Cordia will do the bronchoscopy + EBUS but he wants to wait for the PET scan to better plan the procedure. Thanks!   J. Shirl Harris, MD 07/25/2016, 7:01 AM [place] [facility] Pager [phone] After 3 pm or if no answer, call [phone]

## 2016-07-31 ENCOUNTER — Ambulatory Visit (HOSPITAL_COMMUNITY)
Admission: RE | Admit: 2016-07-31 | Discharge: 2016-07-31 | Disposition: A | Discharge status: Home / Self Care | Payer: Managed Care, Other (non HMO) | Source: Ambulatory Visit | Attending: Pulmonary Disease | Admitting: Pulmonary Disease

## 2016-07-31 ENCOUNTER — Encounter (HOSPITAL_COMMUNITY)
Admission: RE | Admit: 2016-07-31 | Discharge: 2016-07-31 | Disposition: A | Discharge status: Home / Self Care | Payer: Managed Care, Other (non HMO) | Source: Ambulatory Visit | Attending: Pulmonary Disease | Admitting: Pulmonary Disease

## 2016-07-31 DIAGNOSIS — R911 Solitary pulmonary nodule: Secondary | ICD-10-CM | POA: Insufficient documentation

## 2016-07-31 DIAGNOSIS — N200 Calculus of kidney: Secondary | ICD-10-CM | POA: Diagnosis not present

## 2016-07-31 DIAGNOSIS — Z950 Presence of cardiac pacemaker: Secondary | ICD-10-CM | POA: Insufficient documentation

## 2016-07-31 DIAGNOSIS — I700 Atherosclerosis of aorta: Secondary | ICD-10-CM | POA: Insufficient documentation

## 2016-07-31 DIAGNOSIS — R591 Generalized enlarged lymph nodes: Secondary | ICD-10-CM | POA: Insufficient documentation

## 2016-07-31 DIAGNOSIS — J430 Unilateral pulmonary emphysema [MacLeod's syndrome]: Secondary | ICD-10-CM

## 2016-07-31 LAB — PULMONARY FUNCTION TEST
DL/VA % pred: 109 %
DL/VA: 5.23 ml/min/mmHg/L
DLCO UNC: 19.27 ml/min/mmHg
DLCO unc % pred: 52 %
FEF 25-75 Post: 2.35 L/sec
FEF 25-75 Pre: 1.17 L/sec
FEF2575-%Change-Post: 100 %
FEF2575-%Pred-Post: 78 %
FEF2575-%Pred-Pre: 38 %
FEV1-%CHANGE-POST: 18 %
FEV1-%PRED-PRE: 42 %
FEV1-%Pred-Post: 49 %
FEV1-POST: 1.69 L
FEV1-Pre: 1.42 L
FEV1FVC-%Change-Post: -5 %
FEV1FVC-%PRED-PRE: 100 %
FEV6-%Change-Post: 26 %
FEV6-%PRED-POST: 54 %
FEV6-%Pred-Pre: 43 %
FEV6-POST: 2.3 L
FEV6-PRE: 1.82 L
FEV6FVC-%CHANGE-POST: 0 %
FEV6FVC-%PRED-POST: 103 %
FEV6FVC-%PRED-PRE: 103 %
FVC-%Change-Post: 25 %
FVC-%Pred-Post: 52 %
FVC-%Pred-Pre: 41 %
FVC-Post: 2.32 L
FVC-Pre: 1.84 L
PRE FEV6/FVC RATIO: 99 %
Post FEV1/FVC ratio: 73 %
Post FEV6/FVC ratio: 99 %
Pre FEV1/FVC ratio: 77 %
RV % PRED: 140 %
RV: 3.53 L
TLC % PRED: 75 %
TLC: 5.76 L

## 2016-07-31 LAB — GLUCOSE, CAPILLARY: GLUCOSE-CAPILLARY: 98 mg/dL (ref 65–99)

## 2016-07-31 MED ORDER — ALBUTEROL SULFATE (2.5 MG/3ML) 0.083% IN NEBU
2.5000 mg | INHALATION_SOLUTION | Freq: Once | RESPIRATORY_TRACT | Status: AC
  Administered 2016-07-31: 2.5 mg via RESPIRATORY_TRACT

## 2016-07-31 MED ORDER — FLUDEOXYGLUCOSE F - 18 (FDG) INJECTION
13.9200 | Freq: Once | INTRAVENOUS | Status: AC | PRN
Start: 2016-07-31 — End: 2016-07-31
  Administered 2016-07-31: 13.92 via INTRAVENOUS

## 2016-08-03 NOTE — Pre-Procedure Instructions (Signed)
Gleason Ardoin  08/03/2016      [id number] [address]: [phone]: [id number]Phone: [phone]: [phone]   Your procedure is scheduled on May 9  [place] [facility] [place]  Call this number if you have problems the morning of surgery:  [phone]   Remember:  Do not eat food or drink liquids after midnight.  Take these medicines the morning of surgery with A SIP OF WATER amiodarone (Pacerone), Wellbutrin SR,carvedilol (Coreg),colchicine if needed, Levothyroxine (Synthroid), Hydralazine (Apresoline), Urloric  Stop taking aspirin, BC's, Goody's, herbal medications, Fish Oil, Ibuprofen, Advil, Motrin, Aleve, vitamins   Do not wear jewelry, make-up or nail polish.  Do not wear lotions, powders, or perfumes, or deoderant.  Do not shave 48 hours prior to surgery.  Men may shave face and neck.  Do not bring valuables to the hospital.  [facility] responsible for any belongings or valuables.  Contacts, dentures or bridgework may not be worn into surgery.  Leave your suitcase in the car.  After surgery it may be brought to your room.  For patients admitted to the hospital, discharge time will be determined by your treatment team.  Patients discharged the day of surgery will not be allowed to drive home.   Special instructions:  [place] - Preparing for Surgery  Before surgery, you can play an important role.  Because skin is not sterile, your skin needs to be as free of germs as possible.  You can reduce the number of germs on you skin by washing with CHG (chlorahexidine gluconate) soap before surgery.  CHG is an antiseptic cleaner which kills germs and bonds with the skin to continue killing germs even after washing.  Please  DO NOT use if you have an allergy to CHG or antibacterial soaps.  If your skin becomes reddened/irritated stop using the CHG and inform your nurse when you arrive at Short Stay.  Do not shave (including legs and underarms) for at least 48 hours prior to the first CHG shower.  You may shave your face.  Please follow these instructions carefully:   1.  Shower with CHG Soap the night before surgery and the                                morning of Surgery.  2.  If you choose to wash your hair, wash your hair first as usual with your       normal shampoo.  3.  After you shampoo, rinse your hair and body thoroughly to remove the                      Shampoo.  4.  Use CHG as you would any other liquid soap.  You can apply chg directly       to the skin and wash gently with scrungie or a clean washcloth.  5.  Apply the CHG Soap to your body ONLY FROM THE NECK DOWN.        Do not use on open wounds or open sores.  Avoid contact with your eyes,       ears, mouth and genitals (private parts).  Wash  genitals (private parts)       with your normal soap.  6.  Wash thoroughly, paying special attention to the area where your surgery        will be performed.  7.  Thoroughly rinse your body with warm water from the neck down.  8.  DO NOT shower/wash with your normal soap after using and rinsing off       the CHG Soap.  9.  Pat yourself dry with a clean towel.            10.  Wear clean pajamas.            11.  Place clean sheets on your bed the night of your first shower and do not        sleep with pets.  Day of Surgery  Do not apply any lotions/deoderants the morning of surgery.  Please wear clean clothes to the hospital/surgery center.     Please read over the following fact sheets that you were given. Pain Booklet, Coughing and Deep Breathing and Surgical Site Infection Prevention

## 2016-08-04 ENCOUNTER — Encounter (HOSPITAL_COMMUNITY)
Admission: RE | Admit: 2016-08-04 | Discharge: 2016-08-04 | Disposition: A | Discharge status: Home / Self Care | Payer: Managed Care, Other (non HMO) | Source: Ambulatory Visit | Attending: Pulmonary Disease | Admitting: Pulmonary Disease

## 2016-08-04 ENCOUNTER — Encounter (HOSPITAL_COMMUNITY): Payer: Self-pay

## 2016-08-04 DIAGNOSIS — Z01812 Encounter for preprocedural laboratory examination: Secondary | ICD-10-CM | POA: Insufficient documentation

## 2016-08-04 HISTORY — DX: Hypothyroidism, unspecified: E03.9

## 2016-08-04 LAB — BASIC METABOLIC PANEL
Anion gap: 8 (ref 5–15)
BUN: 19 mg/dL (ref 6–20)
CHLORIDE: 105 mmol/L (ref 101–111)
CO2: 27 mmol/L (ref 22–32)
CREATININE: 1.43 mg/dL — AB (ref 0.61–1.24)
Calcium: 9 mg/dL (ref 8.9–10.3)
GFR calc Af Amer: 58 mL/min — ABNORMAL LOW (ref >60)
GFR calc non Af Amer: 50 mL/min — ABNORMAL LOW (ref >60)
Glucose, Bld: 107 mg/dL — ABNORMAL HIGH (ref 65–99)
Potassium: 3.3 mmol/L — ABNORMAL LOW (ref 3.5–5.1)
Sodium: 140 mmol/L (ref 135–145)

## 2016-08-04 LAB — CBC
HEMATOCRIT: 34.7 % — AB (ref 39.0–52.0)
Hemoglobin: 11.1 g/dL — ABNORMAL LOW (ref 13.0–17.0)
MCH: 29.1 pg (ref 26.0–34.0)
MCHC: 32 g/dL (ref 30.0–36.0)
MCV: 91.1 fL (ref 78.0–100.0)
PLATELETS: 212 10*3/uL (ref 150–400)
RBC: 3.81 MIL/uL — ABNORMAL LOW (ref 4.22–5.81)
RDW: 14.3 % (ref 11.5–15.5)
WBC: 6 10*3/uL (ref 4.0–10.5)

## 2016-08-04 NOTE — Progress Notes (Addendum)
PCP is Dr. Lucianne Lei Cardiologist is Dr Harrington Challenger states he saw her about 3 months ago. Denies any chest pain, fever or cough Dr Caryl Comes manages his ICD ICD is [facility]  Reports last dose of Xarelto was 08-02-16 Instructed to bring his CPAP mask on the day of surgery. Message left with Lovey Newcomer [facility] Jude rep) to inform her of date and time of surgery. periop prescription also faxed.

## 2016-08-07 NOTE — Progress Notes (Signed)
Anesthesia Chart Review:  Pt is a 66 year old male scheduled for video bronchoscopy with endobronchial ultrasound on 08/09/2016 with Tera Partridge, M.D.  - PCP is Lucianne Lei, MD - Cardiologist is Dorris Carnes, MD; last office visit 05/11/16.  - EP cardiologist is Virl Axe, MD; last office visit 07/19/15.   PMH includes: Chronic systolic CHF, nonischemic cardiomyopathy, AICD (St. Jude implanted 10/03/12), atrial fibrillation, HTN, hyperlipidemia, COPD, OSA, asthma (as a child), hypothyroidism, anemia. Former smoker. BMI 38.  Medications include: Amiodarone, Lipitor, carvedilol, iron, Lasix, hydralazine, levothyroxine, lisinopril, potassium, sildenafil, spironolactone, xarelto. Last dose xarelto 08/02/16.   Preoperative labs reviewed.  PT will be obtained DOS.   CT chest 07/11/16:  1. Lung-RADS Category 4B, suspicious. Additional imaging evaluation or consultation with pulmonary medicine or thoracic surgery recommended. Right apical pulmonary nodule of volume derived equivalent diameter 13.6 mm. Concurrent mediastinal adenopathy. 2. Diffuse thyroid enlargement is nonspecific but may represent multinodular quarter. This could either be re-evaluated at followup or more entirely characterized with thyroid ultrasound. 3. Right nephrolithiasis.  EKG 05/11/16: NSR. Moderate voltage criteria for LVH, may be normal variant. Nonspecific T wave abnormality. Prolonged QT.  Echo 12/28/14:  - Left ventricle: The cavity size was moderately dilated. There was mild concentric hypertrophy. Systolic function was moderately to severely reduced. The estimated ejection fraction was in the range of 30% to 35%. Moderate diffuse hypokinesis with no identifiable regional variations. Features are consistent with a pseudonormal left ventricular filling pattern, with concomitant abnormal relaxation and increased filling pressure (grade 2 diastolic dysfunction). - Aortic valve: There was mild to moderate regurgitation. - Left  atrium: The atrium was moderately dilated. - Right ventricle: The cavity size was mildly dilated. Wall thickness was normal. - Atrial septum: There was an atrial septal aneurysm. - Pulmonary arteries: Systolic pressure was mildly increased. PA peak pressure: 37 mm Hg (S).  Cardiac cath 07/15/12: No obstructive CAD (luminal irregularities.  If needs catheterization in the future, likely should go from the right femoral.  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    Perioperative prescription for ICD notes last device check 05/09/16 with normal device function. Procedure may interfere with device function and magnet should be placed over device during procedure. Postop interrogation is not needed.  If PT/INR acceptable DOS, I anticipate pt can proceed as scheduled.   Willeen Cass, FNP-BC [facility] Short Stay Surgical Center/Anesthesiology Phone: [phone] 08/07/2016 1:39 PM

## 2016-08-08 ENCOUNTER — Ambulatory Visit (INDEPENDENT_AMBULATORY_CARE_PROVIDER_SITE_OTHER): Payer: Managed Care, Other (non HMO)

## 2016-08-08 DIAGNOSIS — Z95810 Presence of automatic (implantable) cardiac defibrillator: Secondary | ICD-10-CM

## 2016-08-08 DIAGNOSIS — I5022 Chronic systolic (congestive) heart failure: Secondary | ICD-10-CM

## 2016-08-08 NOTE — Progress Notes (Signed)
EPIC Encounter for ICM Monitoring  Patient Name: Jason Russell is a 66 y.o. male Date: 08/08/2016 Primary Care Physican: Lucianne Lei, MD Primary Cardiologist:Ross Electrophysiologist: Faustino Congress Weight:     unknown      Heart Failure questions reviewed, pt asymptomatic.  Patient is having a throat bx procedure tomorrow.    Thoracic impedance below baseline many days during month of April.  Currently at baseline. Discussed reasons for decreased impedance.  Prescribed and confirmed dosage: Furosemide 20 mg 3 tablets (60 mg total) every Tuesday and Saturday and 40 mg all the other days.  Potassium 20 mEq 1 tablet every day. On days when lasix is taken, take 2 tablets  Labs: 08/04/2016 Creatinine 1.43, BUN 19, Potassium 3.3, Sodium 140, EGFR 50-58 07/21/2016 Creatinine 1.76, BUN 28, Potassium 3.4, Sodium 146 06/26/2016 Creatinine 1.50, BUN 22, Potassium 3.8, Sodium 143, EGFR 49-56 05/11/2016 Creatinine 1.67, BUN 25, Potassium 4.0, Sodium 145, EGFR 43-49 04/28/2016 Creatinine 1.37, BUN 22, Potassium 3.8, Sodium 143, EGFR 54-63 04/10/2016 Creatinine 1.37, BUN 23, Potassium 3.8, Sodium 143, EGFR 54-63 02/15/2016 Creatinine 1.76, BUN 25, Potassium 3.7, Sodium 142 08/16/2015 Creatinine 1.63, BUN 26, Potassium 3.5, Sodium 144  07/19/2015 Creatinine 1.56, BUN 24, Potassium 3.4, Sodium 141  12/15/2015 Creatinine 1.34, BUN 28, Potassium 3.8, Sodium 141   Recommendations: No changes. Reviewed foods that are high in salt such as cheese and he did not realize how much salt could be in some of his every day foods.  He will be more vigilant at checking food labels and thinks the decreased impedance is relate to foods. Encouraged to call for fluid symptoms or use local ER for any urgent symptoms.  Follow-up plan: ICM clinic phone appointment on 09/08/2016.  Defib office appointment scheduled on 10/17/2016 with Dr Caryl Comes.  Copy of ICM check sent to primary cardiologist and device physician.   3 month  ICM trend: 08/08/2016   1 Year ICM trend:      [name], RN 08/08/2016 10:40 AM

## 2016-08-09 ENCOUNTER — Encounter (HOSPITAL_COMMUNITY): Payer: Self-pay | Admitting: *Deleted

## 2016-08-09 ENCOUNTER — Ambulatory Visit (HOSPITAL_COMMUNITY): Payer: Managed Care, Other (non HMO) | Admitting: Emergency Medicine

## 2016-08-09 ENCOUNTER — Encounter (HOSPITAL_COMMUNITY)
Admission: RE | Disposition: A | Discharge status: Home / Self Care | Payer: Self-pay | Source: Ambulatory Visit | Attending: Pulmonary Disease

## 2016-08-09 ENCOUNTER — Ambulatory Visit (HOSPITAL_COMMUNITY)
Admission: RE | Admit: 2016-08-09 | Discharge: 2016-08-09 | Disposition: A | Discharge status: Home / Self Care | Payer: Managed Care, Other (non HMO) | Source: Ambulatory Visit | Attending: Pulmonary Disease | Admitting: Pulmonary Disease

## 2016-08-09 ENCOUNTER — Ambulatory Visit (HOSPITAL_COMMUNITY): Payer: Managed Care, Other (non HMO)

## 2016-08-09 DIAGNOSIS — E785 Hyperlipidemia, unspecified: Secondary | ICD-10-CM | POA: Diagnosis not present

## 2016-08-09 DIAGNOSIS — I429 Cardiomyopathy, unspecified: Secondary | ICD-10-CM | POA: Diagnosis not present

## 2016-08-09 DIAGNOSIS — Z95810 Presence of automatic (implantable) cardiac defibrillator: Secondary | ICD-10-CM | POA: Diagnosis not present

## 2016-08-09 DIAGNOSIS — G4733 Obstructive sleep apnea (adult) (pediatric): Secondary | ICD-10-CM | POA: Diagnosis not present

## 2016-08-09 DIAGNOSIS — R911 Solitary pulmonary nodule: Secondary | ICD-10-CM | POA: Diagnosis not present

## 2016-08-09 DIAGNOSIS — Z7901 Long term (current) use of anticoagulants: Secondary | ICD-10-CM | POA: Diagnosis not present

## 2016-08-09 DIAGNOSIS — E039 Hypothyroidism, unspecified: Secondary | ICD-10-CM | POA: Insufficient documentation

## 2016-08-09 DIAGNOSIS — Z87891 Personal history of nicotine dependence: Secondary | ICD-10-CM | POA: Insufficient documentation

## 2016-08-09 DIAGNOSIS — IMO0001 Reserved for inherently not codable concepts without codable children: Secondary | ICD-10-CM

## 2016-08-09 DIAGNOSIS — I2510 Atherosclerotic heart disease of native coronary artery without angina pectoris: Secondary | ICD-10-CM | POA: Insufficient documentation

## 2016-08-09 DIAGNOSIS — Z79899 Other long term (current) drug therapy: Secondary | ICD-10-CM | POA: Insufficient documentation

## 2016-08-09 DIAGNOSIS — J449 Chronic obstructive pulmonary disease, unspecified: Secondary | ICD-10-CM | POA: Diagnosis not present

## 2016-08-09 DIAGNOSIS — I110 Hypertensive heart disease with heart failure: Secondary | ICD-10-CM | POA: Diagnosis not present

## 2016-08-09 DIAGNOSIS — Z09 Encounter for follow-up examination after completed treatment for conditions other than malignant neoplasm: Secondary | ICD-10-CM

## 2016-08-09 DIAGNOSIS — I5022 Chronic systolic (congestive) heart failure: Secondary | ICD-10-CM | POA: Insufficient documentation

## 2016-08-09 DIAGNOSIS — I4891 Unspecified atrial fibrillation: Secondary | ICD-10-CM | POA: Diagnosis not present

## 2016-08-09 DIAGNOSIS — R590 Localized enlarged lymph nodes: Secondary | ICD-10-CM | POA: Diagnosis not present

## 2016-08-09 HISTORY — PX: VIDEO BRONCHOSCOPY WITH ENDOBRONCHIAL ULTRASOUND: SHX6177

## 2016-08-09 LAB — PROTIME-INR
INR: 1.01
PROTHROMBIN TIME: 13.4 s (ref 11.4–15.2)

## 2016-08-09 LAB — GLUCOSE, CAPILLARY: GLUCOSE-CAPILLARY: 110 mg/dL — AB (ref 65–99)

## 2016-08-09 SURGERY — BRONCHOSCOPY, WITH EBUS
Anesthesia: Choice

## 2016-08-09 MED ORDER — ROCURONIUM BROMIDE 100 MG/10ML IV SOLN
INTRAVENOUS | Status: DC | PRN
  Administered 2016-08-09: 10 mg via INTRAVENOUS
  Administered 2016-08-09: 50 mg via INTRAVENOUS
  Administered 2016-08-09: 20 mg via INTRAVENOUS

## 2016-08-09 MED ORDER — DEXAMETHASONE SODIUM PHOSPHATE 10 MG/ML IJ SOLN
INTRAMUSCULAR | Status: AC
  Filled 2016-08-09: qty 1

## 2016-08-09 MED ORDER — LIDOCAINE HCL (CARDIAC) 20 MG/ML IV SOLN
INTRAVENOUS | Status: DC | PRN
  Administered 2016-08-09: 60 mg via INTRAVENOUS

## 2016-08-09 MED ORDER — LIDOCAINE HCL 1 % IJ SOLN
INTRAMUSCULAR | Status: AC
  Filled 2016-08-09: qty 20

## 2016-08-09 MED ORDER — ONDANSETRON HCL 4 MG/2ML IJ SOLN
INTRAMUSCULAR | Status: AC
  Filled 2016-08-09: qty 2

## 2016-08-09 MED ORDER — FENTANYL CITRATE (PF) 100 MCG/2ML IJ SOLN: 25.0000 ug | INTRAMUSCULAR | Status: DC | PRN

## 2016-08-09 MED ORDER — PROMETHAZINE HCL 25 MG/ML IJ SOLN
6.2500 mg | INTRAMUSCULAR | Status: DC | PRN
Start: 2016-08-09 — End: 2016-08-09

## 2016-08-09 MED ORDER — MIDAZOLAM HCL 2 MG/2ML IJ SOLN: 0.5000 mg | Freq: Once | INTRAMUSCULAR | Status: DC | PRN

## 2016-08-09 MED ORDER — LIDOCAINE 2% (20 MG/ML) 5 ML SYRINGE
INTRAMUSCULAR | Status: AC
  Filled 2016-08-09: qty 5

## 2016-08-09 MED ORDER — PROPOFOL 10 MG/ML IV BOLUS
INTRAVENOUS | Status: DC | PRN
  Administered 2016-08-09: 130 mg via INTRAVENOUS

## 2016-08-09 MED ORDER — MEPERIDINE HCL 25 MG/ML IJ SOLN
6.2500 mg | INTRAMUSCULAR | Status: DC | PRN
Start: 2016-08-09 — End: 2016-08-09

## 2016-08-09 MED ORDER — SUGAMMADEX SODIUM 200 MG/2ML IV SOLN
INTRAVENOUS | Status: DC | PRN
  Administered 2016-08-09: 500 mg via INTRAVENOUS

## 2016-08-09 MED ORDER — MIDAZOLAM HCL 2 MG/2ML IJ SOLN
INTRAMUSCULAR | Status: AC
Start: 2016-08-09 — End: 2016-08-09
  Filled 2016-08-09: qty 2

## 2016-08-09 MED ORDER — LACTATED RINGERS IV SOLN
INTRAVENOUS | Status: DC
  Administered 2016-08-09 (×2): via INTRAVENOUS

## 2016-08-09 MED ORDER — MIDAZOLAM HCL 2 MG/2ML IJ SOLN
INTRAMUSCULAR | Status: DC | PRN
  Administered 2016-08-09: 2 mg via INTRAVENOUS

## 2016-08-09 MED ORDER — 0.9 % SODIUM CHLORIDE (POUR BTL) OPTIME
TOPICAL | Status: DC | PRN
  Administered 2016-08-09: 1000 mL

## 2016-08-09 MED ORDER — RIVAROXABAN 20 MG PO TABS: 20.0000 mg | ORAL_TABLET | Freq: Every day | ORAL | 2 refills | Status: DC

## 2016-08-09 MED ORDER — ROCURONIUM BROMIDE 10 MG/ML (PF) SYRINGE
PREFILLED_SYRINGE | INTRAVENOUS | Status: AC
  Filled 2016-08-09: qty 5

## 2016-08-09 MED ORDER — FENTANYL CITRATE (PF) 250 MCG/5ML IJ SOLN
INTRAMUSCULAR | Status: AC
  Filled 2016-08-09: qty 5

## 2016-08-09 MED ORDER — FENTANYL CITRATE (PF) 100 MCG/2ML IJ SOLN
INTRAMUSCULAR | Status: DC | PRN
  Administered 2016-08-09 (×2): 50 ug via INTRAVENOUS

## 2016-08-09 SURGICAL SUPPLY — 30 items
BRUSH CYTOL CELLEBRITY 1.5X140 (MISCELLANEOUS) IMPLANT
CANISTER SUCT 3000ML PPV (MISCELLANEOUS) ×3 IMPLANT
CANNULA VESSEL 3MM 2 BLNT TIP (CANNULA) ×3 IMPLANT
CONT SPEC 4OZ CLIKSEAL STRL BL (MISCELLANEOUS) ×3 IMPLANT
COVER BACK TABLE 60X90IN (DRAPES) ×3 IMPLANT
COVER DOME SNAP 22 D (MISCELLANEOUS) ×3 IMPLANT
FORCEPS BIOP RJ4 1.8 (CUTTING FORCEPS) IMPLANT
GAUZE SPONGE 4X4 12PLY STRL (GAUZE/BANDAGES/DRESSINGS) ×3 IMPLANT
GOWN STRL NON-REIN LRG LVL3 (GOWN DISPOSABLE) ×3 IMPLANT
GOWN STRL REUS W/ TWL LRG LVL3 (GOWN DISPOSABLE) ×1 IMPLANT
GOWN STRL REUS W/TWL LRG LVL3 (GOWN DISPOSABLE) ×2
KIT CLEAN ENDO COMPLIANCE (KITS) ×6 IMPLANT
KIT ROOM TURNOVER OR (KITS) ×3 IMPLANT
MARKER SKIN DUAL TIP RULER LAB (MISCELLANEOUS) ×3 IMPLANT
NEEDLE EBUS SONO TIP PENTAX (NEEDLE) ×6 IMPLANT
NS IRRIG 1000ML POUR BTL (IV SOLUTION) ×3 IMPLANT
PAD ARMBOARD 7.5X6 YLW CONV (MISCELLANEOUS) ×6 IMPLANT
STOPCOCK 4 WAY LG BORE MALE ST (IV SETS) ×3 IMPLANT
SURGILUBE 2OZ TUBE FLIPTOP (MISCELLANEOUS) ×3 IMPLANT
SYR 10ML LL (SYRINGE) ×6 IMPLANT
SYR 20CC LL (SYRINGE) ×3 IMPLANT
SYR 20ML ECCENTRIC (SYRINGE) ×3 IMPLANT
SYR 50ML LL SCALE MARK (SYRINGE) ×3 IMPLANT
SYR 5ML LL (SYRINGE) ×3 IMPLANT
SYR 5ML LUER SLIP (SYRINGE) ×3 IMPLANT
TOWEL OR 17X24 6PK STRL BLUE (TOWEL DISPOSABLE) ×3 IMPLANT
TRAP SPECIMEN MUCOUS 40CC (MISCELLANEOUS) IMPLANT
TUBE CONNECTING 20'X1/4 (TUBING) ×2
TUBE CONNECTING 20X1/4 (TUBING) ×4 IMPLANT
WATER STERILE IRR 1000ML POUR (IV SOLUTION) ×3 IMPLANT

## 2016-08-09 NOTE — Op Note (Signed)
Video Bronchoscopy Procedure Note  Pre-Procedure Diagnoses: 1.  Hypermetabolic Right Upper Lobe Nodule 2.  Mediastinal Adenopathy  Post-Procedure Diagnoses: 1.  Hypermetabolic Right Upper Lobe Nodule 2.  Mediastinal Adenopathy  Procedures Performed: 1. Bronchoscopy with Airway Inspection 2. Endobronchial Ultrasound with Needle Aspiration  Consent:  Informed consent was obtained from the patient after discussing the risks and benefits of the procedure including bleeding, infection, pneumothorax, medication allergy, vocal chord injury, and potentially death.  Sedation:  Please refer to anesthesia documentation.  Description of Procedure: Patient was brought back to the operating room.  A time out was performed to identify the correct patient and procedure.  Patient was endotracheally intubated after sedation was administered by anesthesia. The flexible bronchoscope was then inserted endotracheal tube with ease and into the proximal trachea.  An airway inspeciton was performed finding normal airways with scant amount of mucus. No endobronchial lesions or masses identified. The flexible bronchoscope was then removed from the patient's airways after suctioning of the remaining secretions.  The endobronchial ultrasound scope was then inserted into the endotracheal tube with ease.  An enlarged lymph node at level 7 was identified with endobronchial ultrasound.  Utilizing a 22 gauge needle under direct visualization with the ultrasound a total of 5 passes were performed at level 7.  The endobronchial ultrasound scope was then repositioned to level 4R identifying an enlarged lymph node. Utilizing a new 22-gauge needle under direct ultrasound visualization a total of 6 passes were performed at level 4R. Hemostasis was directly visualized.  The remaining secretions were suctioned from the patient's airways and the endobronchial ultrasound scope was removed.    Blood Loss:  Less than 10  cc.  Complications:  None.  EBUS Procedure: Level 4R:  1.5 cm lymph node / biopsy performed / ROSE / passes 4 - 6 for cytology & cell block / passes 7-9 for cell block only Level 4L:  No lymph node visualized Level 7:  2 cm lymph node / biopsy performed / ROSE / passes 1 - 3 for cytology & cell block / passes 4 & 5 for cell block only  Post Procedure Stat Portable CXR:  Ordered and pending.  Post Procedure Instructions: Personally spoke with the patient's ex-wife and family friend relaying the preliminary results of the procedure.  Instructed them that the patient is not to operate a car for 24 hours.  The patient should seek immediate medical attention if there is any persistent or progressive hemoptysis, difficulty breathing, or chest pain/discomfort.  The patient should also notify me immediately or seek medical attention for any purulent sputum production or fever occuring today or in the coming days.  The patient will be contacted by me once the final results of the studies are available.  The patient should call my office if they have any questions.

## 2016-08-09 NOTE — Progress Notes (Signed)
Dr. Tobias Alexander states that we do not need the St. Jude Rep present for the procedure,will use magnet as Dr. Caryl Comes wrote on the Perioperative implanted Device orders.

## 2016-08-09 NOTE — Discharge Instructions (Signed)
Seek immediate medical attention for any difficulty breathing, chest discomfort, or cough persistently producing a bloody mucus. Contact the [facility] office if you develop any persistent fever or cough producing a discolored, green, or yellow mucus in the next few days. Start taking your Xarelto again on 5/11 (Friday).

## 2016-08-09 NOTE — Anesthesia Procedure Notes (Signed)
Procedure Name: Intubation Date/Time: 08/09/2016 11:06 AM Performed by: Tressia Miners LEFFEW Pre-anesthesia Checklist: Patient identified, Patient being monitored, Timeout performed, Emergency Drugs available and Suction available Patient Re-evaluated:Patient Re-evaluated prior to inductionOxygen Delivery Method: Circle System Utilized Preoxygenation: Pre-oxygenation with 100% oxygen Intubation Type: IV induction Ventilation: Mask ventilation without difficulty Laryngoscope Size: Mac, Glidescope and 4 Grade View: Grade I Tube type: Oral Tube size: 8.5 mm Number of attempts: 1 Airway Equipment and Method: Stylet and Video-laryngoscopy Placement Confirmation: ETT inserted through vocal cords under direct vision,  positive ETCO2 and breath sounds checked- equal and bilateral Secured at: 21 cm Tube secured with: Tape Dental Injury: Teeth and Oropharynx as per pre-operative assessment

## 2016-08-09 NOTE — Transfer of Care (Signed)
Immediate Anesthesia Transfer of Care Note  Patient: Jason Russell  Procedure(s) Performed: Procedure(s): VIDEO BRONCHOSCOPY WITH ENDOBRONCHIAL ULTRASOUND (N/A)  Patient Location: PACU  Anesthesia Type:General  Level of Consciousness: awake, alert , oriented, patient cooperative and responds to stimulation  Airway & Oxygen Therapy: Patient Spontanous Breathing and Patient connected to face mask oxygen  Post-op Assessment: Report given to RN, Post -op Vital signs reviewed and stable and Patient moving all extremities X 4  Post vital signs: Reviewed and stable  Last Vitals:  Vitals:   08/09/16 1001  BP: (!) 144/88  Pulse: 65  Resp: (!) 24  Temp: 36.7 C    Last Pain:  Vitals:   08/09/16 1001  TempSrc: Oral         Complications: No apparent anesthesia complications

## 2016-08-09 NOTE — Anesthesia Postprocedure Evaluation (Signed)
Anesthesia Post Note  Patient: Kiandre Spagnolo  Procedure(s) Performed: Procedure(s) (LRB): VIDEO BRONCHOSCOPY WITH ENDOBRONCHIAL ULTRASOUND (N/A)  Patient location during evaluation: PACU Anesthesia Type: General Level of consciousness: awake and alert, oriented and patient cooperative Pain management: pain level controlled Vital Signs Assessment: post-procedure vital signs reviewed and stable Respiratory status: spontaneous breathing, nonlabored ventilation, respiratory function stable and patient connected to nasal cannula oxygen Cardiovascular status: blood pressure returned to baseline and stable Postop Assessment: no signs of nausea or vomiting Anesthetic complications: no       Last Vitals:  Vitals:   08/09/16 1300 08/09/16 1307  BP:    Pulse: 74   Resp: (!) 25   Temp:  36.6 C    Last Pain:  Vitals:   08/09/16 1001  TempSrc: Oral                 Rylie Limburg,E. Dereona Kolodny

## 2016-08-09 NOTE — H&P (Signed)
[facility]  Date of Procedure:  08/09/2016  Reason for Procedure/Chief Complaint:  Hypermetabolic Lung Nodule  History of Presenting Illness:  66 y.o. male with a long-standing history of tobacco use. Patient has a known history of nonischemic cardiomyopathy and is on chronic systemic anticoagulation. He also has a known history of obstructive sleep apnea and is currently on CPAP therapy as an outpatient. Patient underwent a CT scan for lung cancer screening revealing a lung nodule as well as other abnormality which prompted referral. With undergoing the evaluation he underwent PET/CT imaging which did show significant hypermetabolism to his right upper lobe nodule as well as hypermetabolic precarinal and subcarinal lymphadenopathy. Case was discussed with his primary pulmonologist who felt that staging with mediastinal lymph node biopsy would be the safest for the patient and most appropriate. Patient denied any expected weight change. He denied any cough, shortness of breath, or chest tightness. He denied any lower extremity edema. Patient again denies any chest pain or pressure. He denies any subjective fever, chills or sweats. Denies abdominal pain or nausea. Denies any dysphagia or odynophagia. Denies any melena or hematochezia. Denies any bleeding problems. Reports last dose of Xarelto was last Monday. No headaches. No focal weakness, numbness, or tingling. No lymphadenopathy in his neck, groin, or axilla.  Review of Systems:  No rashes or abnormal bruising. No dysuria or hematuria. A pertinent 14 point review of systems is negative except as per the history of presenting illness.  Allergies  Allergen Reactions  . No Known Allergies     No current facility-administered medications on file prior to encounter.    Current Outpatient Prescriptions on File Prior to Encounter  Medication Sig Dispense Refill  . amiodarone (PACERONE) 100 MG tablet Take 1 tablet (100 mg total)  by mouth daily. 90 tablet 2  . atorvastatin (LIPITOR) 20 MG tablet TAKE 1 TABLET BY MOUTH EVERY DAY 90 tablet 3  . buPROPion (WELLBUTRIN SR) 150 MG 12 hr tablet TAKE 1 TABLET (150 MG TOTAL) BY MOUTH 2 (TWO) TIMES DAILY. (Patient taking differently: Take 150 mg by mouth 2 (two) times daily. ) 180 tablet 2  . carvedilol (COREG) 12.5 MG tablet TAKE 1 TABLET (12.5 MG TOTAL) BY MOUTH 2 (TWO) TIMES DAILY. 180 tablet 3  . colchicine 0.6 MG tablet Take 1 tablet by mouth as needed (gout flares).     . ferrous gluconate (FERGON) 324 MG tablet Take 1 tablet (324 mg total) by mouth 3 (three) times daily with meals. 90 tablet 8  . furosemide (LASIX) 20 MG tablet Take 3 tabs (60 mg) every Tue and Sat, all other days take 2 tabs (40 mg) (Patient taking differently: Take 40-60 mg by mouth See admin instructions. Take 3 tabs (60 mg) every Tue and Sat, all other days take 2 tabs (40 mg)) 64 tablet 6  . hydrALAZINE (APRESOLINE) 100 MG tablet Take 1 tablet by mouth 3 (three) times daily.  1  . levothyroxine (SYNTHROID, LEVOTHROID) 50 MCG tablet TAKE 1 TABLET (50 MCG TOTAL) BY MOUTH DAILY BEFORE BREAKFAST. 90 tablet 3  . lisinopril (PRINIVIL,ZESTRIL) 40 MG tablet TAKE 1 TABLET BY MOUTH DAILY. 90 tablet 1  . potassium chloride SA (KLOR-CON M20) 20 MEQ tablet Take 1 tablet every day.  On days when lasix is taken, take 2 tablets (Patient taking differently: Take 20 mEq by mouth See admin instructions. Take 1 tablet every day.  On days when lasix is taken, take 2 tablets) 135 tablet 3  .  sildenafil (REVATIO) 20 MG tablet Take 1 tablet (20 mg total) by mouth at bedtime. Take 2-3 tablets daily prn for ED 30 tablet 2  . spironolactone (ALDACTONE) 25 MG tablet TAKE 0.5 TABLETS (12.5 MG TOTAL) BY MOUTH DAILY. 45 tablet 1  . ULORIC 40 MG tablet Take 1 tablet by mouth daily.    . Vitamin D, Ergocalciferol, (DRISDOL) 50000 units CAPS capsule Take 1 capsule by mouth once a week. Monday    . XARELTO 20 MG TABS tablet TAKE 1 TABLET BY  MOUTH DAILY. 90 tablet 2    Past Medical History:  Diagnosis Date  . Anemia   . Arthritis    "hx right hip"  . Asthma    "when I was a child"  . Atrial fibrillation (HCC)    Amiodarone started 10/2011; Coumadin  . 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 St. Jude ICD implantation 10/03/12.  . Chronic anticoagulation   . Chronic systolic heart failure ([place])    a. Echo 7/13: EF 25%;  b. echo 04/2012:  Mild LVH, EF 30-35%, Gr 1 DD, mild AI, mild MR, mild LAE  . COPD (chronic obstructive pulmonary disease) ([place])   . Dyslipidemia   . Dysrhythmia   . Gout   . History of blood transfusion 10/15/2013   "don't know where the blood's going; HgB down to 5"  . Hyperlipidemia   . Hypertension   . Hypothyroidism   . NICM (nonischemic cardiomyopathy) ([place])    [place] 4/14:  minimal CAD  . Obesity   . OSA on CPAP   . Tobacco abuse     Past Surgical History:  Procedure Laterality Date  . CARDIAC DEFIBRILLATOR PLACEMENT  2014  . CARDIOVERSION  2011  . COLONOSCOPY WITH PROPOFOL Left 10/17/2013   Procedure: COLONOSCOPY WITH PROPOFOL;  Surgeon: Inda Castle, MD;  Location: [facility];  Service: Endoscopy;  Laterality: Left;  . ESOPHAGOGASTRODUODENOSCOPY N/A 10/17/2013   Procedure: ESOPHAGOGASTRODUODENOSCOPY (EGD);  Surgeon: Inda Castle, MD;  Location: [facility];  Service: Endoscopy;  Laterality: N/A;  . GIVENS CAPSULE STUDY N/A 10/29/2013   Procedure: GIVENS CAPSULE STUDY;  Surgeon: Inda Castle, MD;  Location: [facility];  Service: Endoscopy;  Laterality: N/A;  . IMPLANTABLE CARDIOVERTER DEFIBRILLATOR IMPLANT Left 10/03/2012   Procedure: IMPLANTABLE CARDIOVERTER DEFIBRILLATOR IMPLANT;  Surgeon: Deboraha Sprang, MD;  Location: [facility] CATH LAB;  Service: Cardiovascular;  Laterality: Left;  . JOINT REPLACEMENT    . TONSILLECTOMY  1950's  . TOTAL HIP ARTHROPLASTY Right 11/25/1997    Family History  Problem Relation Age of Onset  . Hypertension Mother   .  Other Father     deceased- unknow reason  . Lung cancer Brother     Social History   Social History  . Marital status: Married    Spouse name: N/A  . Number of children: 1  . Years of education: N/A   Occupational History  . [employer] - Freight forwarder [employer]   Social History Main Topics  . Smoking status: Former Smoker    Packs/day: 0.25    Years: 45.00    Types: Cigarettes    Quit date: 07/11/2016  . Smokeless tobacco: Never Used  . Alcohol use 0.6 oz/week    1 Cans of beer per week  . Drug use: No  . Sexual activity: Not Currently   Other Topics Concern  . None   Social History Narrative  . None    BP [name])  144/88   Pulse 65   Temp 98.1 F (36.7 C) (Oral)   Resp (!) 24   SpO2 100%   General:  Awake. Alert. No acute distress. Family and friend at bedside. Obese. Integument:  Warm & dry. No rash or bruising on exposed skin.  Extremities:  No cyanosis.  Lymphatics:  No appreciated cervical or supraclavicular lymphadenoapthy. HEENT:  Moist mucus membranes. No oral ulcers. No scleral injection or icterus. Mallampati class III. Cardiovascular:  Regular rate. No edema. No JVD appreciated.  Pulmonary:  Good aeration & clear to auscultation bilaterally. Symmetric chest wall expansion. No accessory muscle use on room air. Abdomen: Soft. Normal bowel sounds. Protuberant. Nontender. Musculoskeletal:  Normal bulk and tone. No joint deformity or effusion appreciated. Neurological:  Cranial nerves 2-12 grossly in tact. No meningismus. Moving all 4 extremities equally.  Psychiatric:  Mood and affect congruent. Speech normal rhythm, rate & tone.   CBC Latest Ref Rng & Units 08/04/2016 07/21/2016 05/11/2016  WBC 4.0 - 10.5 K/uL 6.0 5.5 5.6  Hemoglobin 13.0 - 17.0 g/dL 11.1(L) - -  Hematocrit 39.0 - 52.0 % 34.7(L) 37.1(L) 41.7  Platelets 150 - 400 K/uL 212 182 171    BMP Latest Ref Rng & Units 08/04/2016 07/21/2016 06/26/2016  Glucose 65 - 99 mg/dL 107(H) 80 99  BUN  6 - 20 mg/dL 19 28(H) 22  Creatinine 0.61 - 1.24 mg/dL 1.43(H) 1.76(H) 1.50(H)  BUN/Creat Ratio 10 - 24 - 16 15  Sodium 135 - 145 mmol/L 140 146(H) 143  Potassium 3.5 - 5.1 mmol/L 3.3(L) 3.4(L) 3.8  Chloride 101 - 111 mmol/L 105 102 103  CO2 22 - 32 mmol/L '27 28 27  '$ Calcium 8.9 - 10.3 mg/dL 9.0 9.6 9.2    IMAGING/STUDIES: PET CT 4/30 (personally reviewed by me):  1.4 cm hypermetabolic right upper lobe nodule with a max SUV 15. Hypermetabolic right precarinal lymph node as well as subcarinal lymph node. No other areas of hypermetabolism to suggest metastases. No pleural effusion or thickening. No pericardial effusion. Lymph nodes measuring over 2 cm in short axis by my review.  ANTIBIOTICS: None.  ASSESSMENT/PLAN:  65 y.o. male with multiple medical problems. Hypermetabolic right upper lobe nodule as well as hypermetabolic mediastinal lymph nodes suspicious for lung cancer. Patient has been off of his systemic anticoagulation for over 7 days per his report today. Discussed the risks of bronchoscopy with endobronchial ultrasound and fine-needle aspiration of mediastinal lymph nodes including bleeding, infection, pneumothorax, vocal cord injury, medication allergy, and potentially death. The patient is willing to undergo the procedure. We will proceed with bronchoscopy as planned.  Javier Glazier, M.D. [facility] Pager:  [phone] After 3pm or if no response, call [phone] 10:42 AM 08/09/16

## 2016-08-09 NOTE — Anesthesia Preprocedure Evaluation (Addendum)
Anesthesia Evaluation  Patient identified by MRN, date of birth, ID band Patient awake    Reviewed: Allergy & Precautions, NPO status , Patient's Chart, lab work & pertinent test results, reviewed documented beta blocker date and time   History of Anesthesia Complications Negative for: history of anesthetic complications  Airway Mallampati: III  TM Distance: <3 FB Neck ROM: Limited    Dental  (+) Dental Advisory Given, Caps   Pulmonary sleep apnea and Continuous Positive Airway Pressure Ventilation , COPD, former smoker (recently quit),    breath sounds clear to auscultation       Cardiovascular hypertension, Pt. on medications and Pt. on home beta blockers + dysrhythmias Atrial Fibrillation + Cardiac Defibrillator  Rhythm:Regular Rate:Normal  '16 ECHO: EF 30% to 35%.  Moderate diffuse hypokinesis, mild-mod AI   Neuro/Psych negative neurological ROS     GI/Hepatic   Endo/Other  Hypothyroidism Morbid obesity  Renal/GU Renal InsufficiencyRenal disease     Musculoskeletal  (+) Arthritis ,   Abdominal (+) + obese,   Peds  Hematology  (+) Blood dyscrasia (xarelto, last dose monday 4/30), ,   Anesthesia Other Findings   Reproductive/Obstetrics                            Anesthesia Physical Anesthesia Plan  ASA: III  Anesthesia Plan: General   Post-op Pain Management:    Induction: Intravenous  Airway Management Planned: Video Laryngoscope Planned and Oral ETT  Additional Equipment:   Intra-op Plan:   Post-operative Plan: Extubation in OR  Informed Consent: I have reviewed the patients History and Physical, chart, labs and discussed the procedure including the risks, benefits and alternatives for the proposed anesthesia with the patient or authorized representative who has indicated his/her understanding and acceptance.   Dental advisory given  Plan Discussed with: CRNA and  Surgeon  Anesthesia Plan Comments: (Plan routine monitors, GETA with VideoGlide intubation)        Anesthesia Quick Evaluation

## 2016-08-10 ENCOUNTER — Encounter (HOSPITAL_COMMUNITY): Payer: Self-pay | Admitting: Pulmonary Disease

## 2016-08-15 ENCOUNTER — Other Ambulatory Visit: Payer: Self-pay | Admitting: Pulmonary Disease

## 2016-08-15 ENCOUNTER — Encounter: Payer: Self-pay | Admitting: Pulmonary Disease

## 2016-08-15 DIAGNOSIS — C3490 Malignant neoplasm of unspecified part of unspecified bronchus or lung: Secondary | ICD-10-CM

## 2016-08-15 NOTE — Progress Notes (Signed)
Spoke with patient and informed him of [place] order being placed. Pt verbalized understanding and did not have any questions. Order placed for [place]. Nothing further is needed.

## 2016-08-17 ENCOUNTER — Telehealth: Payer: Self-pay | Admitting: *Deleted

## 2016-08-17 ENCOUNTER — Ambulatory Visit: Payer: Managed Care, Other (non HMO) | Admitting: Pulmonary Disease

## 2016-08-17 DIAGNOSIS — R590 Localized enlarged lymph nodes: Secondary | ICD-10-CM

## 2016-08-17 NOTE — Telephone Encounter (Signed)
Oncology Nurse Navigator Documentation  Oncology Nurse Navigator Flowsheets 08/17/2016  Navigator Location [facility]  Referral date to RadOnc/MedOnc 08/17/2016  Navigator Encounter Type Telephone/I received referral on Jason Russell today. I called and he is scheduled to be seen at [facility] on 08/24/16.  He verbalized understanding of appt time and place.   Telephone Outgoing Call  Treatment Phase Pre-Tx/Tx Discussion  Barriers/Navigation Needs Coordination of Care  Interventions Coordination of Care  Coordination of Care Appts  Acuity Level 2  Acuity Level 2 Assistance expediting appointments  Time Spent with Patient 30

## 2016-08-18 ENCOUNTER — Encounter: Payer: Self-pay | Admitting: Pulmonary Disease

## 2016-08-18 ENCOUNTER — Ambulatory Visit (INDEPENDENT_AMBULATORY_CARE_PROVIDER_SITE_OTHER): Payer: Managed Care, Other (non HMO) | Admitting: Pulmonary Disease

## 2016-08-18 ENCOUNTER — Encounter: Payer: Self-pay | Admitting: *Deleted

## 2016-08-18 DIAGNOSIS — J430 Unilateral pulmonary emphysema [MacLeod's syndrome]: Secondary | ICD-10-CM | POA: Diagnosis not present

## 2016-08-18 DIAGNOSIS — G4733 Obstructive sleep apnea (adult) (pediatric): Secondary | ICD-10-CM | POA: Diagnosis not present

## 2016-08-18 DIAGNOSIS — R911 Solitary pulmonary nodule: Secondary | ICD-10-CM

## 2016-08-18 MED ORDER — ALBUTEROL SULFATE HFA 108 (90 BASE) MCG/ACT IN AERS: 2.0000 | INHALATION_SPRAY | RESPIRATORY_TRACT | 2 refills | Status: DC | PRN

## 2016-08-18 NOTE — Addendum Note (Signed)
Addended by: Benson Setting L on: 08/18/2016 09:45 AM   Modules accepted: Orders

## 2016-08-18 NOTE — Assessment & Plan Note (Signed)
Patient with 50-pack-year smoking history at least, quit a week ago. He had a lung cancer screening CAT scan which showed a suspicious 13 mm right upper lung nodule as well as a right hilar and right subcarinal lymphadenopathy.  He has a lot of comorbidities with nonischemic cardiomyopathy, EF 30%, chronic atrial fibrillation, COPD (not on meds), severe OSA compliant on cpap.   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  Incidental right shoulder rotator cuff cough tendinitis the last year. 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  PET scan showed hypermetabolic RUL as well as 4R and 7 LN. (-) other lesions seen  He had bronchoscopy with EBUS with Dr. Ashok Cordia.  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   Discussed results with patient again.  Pt has an appointment to see Dr. Earlie Server next week.

## 2016-08-18 NOTE — Assessment & Plan Note (Signed)
Pt with severe OSA with AHI 33 and he has good compliance and download with autocpap 5-20 cm water.   Cont autocpap for now.  DL last month : 97%, AHI  2.6.

## 2016-08-18 NOTE — Assessment & Plan Note (Addendum)
Pt with significant smoking history.  He has fair fxnal capacity. 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  He takes his time walking and exerting. (also with cardiac issues).  PFT with N ratio byt FLOOP suggestive of COPD.  Severe FEV1 but he has fair fxnal capacity.  Pt wants to hold off on maintenance meds.  Cont albuterol MDI as needed.  Need to discuss vaccines on f/u (PNA and flu)

## 2016-08-18 NOTE — Progress Notes (Signed)
Subjective:    Patient ID: Jason Russell, male    DOB: [birth year], 66 y.o.   MRN: [id number]  HPI    This is the case of Jason Russell, 66 y.o. Male, who was referred by Dr. Lucianne Lei  in consultation regarding lung nodule.   As you very well know, patient Is known to have congestive heart failure with an EF of 25-30%, known to have NICM, has a 44 PY smoking history, also has COPD but is not on meds.  Patient has severe OSA with AHI of 33 and is compliant on autocpap 5-20 cm water.  I see him for OSA.   He had a lung CA screening ct scan which was abnormal.    He remains to have fair functional capacity. He takes care of wife.   He is on chronic [place] for CAD and afib. On xarelto 1 yr now.    ROV 08/18/16 Since last seen, he had a PET scan which showed hypermetabolic right upper lobe nodule as well as 4R and 7 lymph nodes. He had bronchoscopy with EBUS which showed adenocarcinoma in the lymph nodes. Tolerated procedure well. Remains fairly functional. Denies being too winded. No recent antibiotics.  Review of Systems  Constitutional: Negative.  Negative for fever and unexpected weight change.  HENT: Negative.  Negative for congestion, dental problem, ear pain, nosebleeds, postnasal drip, rhinorrhea, sinus pressure, sneezing, sore throat and trouble swallowing.   Eyes: Negative.  Negative for redness and itching.  Respiratory: Positive for shortness of breath. Negative for cough, chest tightness and wheezing.   Cardiovascular: Negative.  Negative for palpitations and leg swelling.  Gastrointestinal: Negative.  Negative for nausea and vomiting.  Endocrine: Negative.   Genitourinary: Negative.  Negative for dysuria.  Musculoskeletal: Negative.  Negative for joint swelling.  Skin: Negative.  Negative for rash.  Allergic/Immunologic: Negative.  Negative for environmental allergies, food allergies and immunocompromised state.  Neurological: Negative for headaches.  Hematological:  Negative.  Does not bruise/bleed easily.  Psychiatric/Behavioral: Negative.  Negative for dysphoric mood. The patient is not nervous/anxious.         Objective:   Physical Exam  Vitals:  Vitals:   08/18/16 0856  BP: 132/80  Pulse: 80  SpO2: 96%  Weight: 285 lb 9.6 oz (129.5 kg)  Height: '6\' 1"'$  (1.854 m)    Constitutional/General:  Pleasant, well-nourished, well-developed, not in any distress,  Comfortably seating.  Well kempt  Body mass index is 37.68 kg/m. Wt Readings from Last 3 Encounters:  08/18/16 285 lb 9.6 oz (129.5 kg)  08/04/16 287 lb 5 oz (130.3 kg)  07/21/16 282 lb (127.9 kg)     HEENT: Pupils equal and reactive to light and accommodation. Anicteric sclerae. Normal nasal mucosa.   No oral  lesions,  mouth clear,  oropharynx clear, no postnasal drip. (-) Oral thrush. No dental caries.  Airway - Mallampati class III  Neck: No masses. Midline trachea. No JVD, (-) LAD. (-) bruits appreciated.  Respiratory/Chest: Grossly normal chest. (-) deformity. (-) Accessory muscle use.  Symmetric expansion. (-) Tenderness on palpation.  Resonant on percussion.  Diminished BS on both lower lung zones. (-) wheezing, crackles, rhonchi (-) egophony  Cardiovascular: Regular rate and  rhythm, heart sounds normal, no murmur or gallops, no peripheral edema  Gastrointestinal:  Normal bowel sounds. Soft, non-tender. No hepatosplenomegaly.  (-) masses.   Musculoskeletal:  Normal muscle tone. Normal gait.   Extremities: Grossly normal. (-) clubbing, cyanosis.  (-) edema  Skin: (-) rash,lesions  seen.   Neurological/Psychiatric : alert, oriented to time, place, person. Normal mood and affect          Assessment & Plan:  COPD (chronic obstructive pulmonary disease) ([place]) Pt with significant smoking history.  He has fair fxnal capacity. 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  He takes his time walking and exerting. (also with cardiac issues).  PFT with N ratio byt  FLOOP suggestive of COPD.  Severe FEV1 but he has fair fxnal capacity.  Pt wants to hold off on maintenance meds.  Cont albuterol MDI as needed.  Need to discuss vaccines on f/u (PNA and flu)  Obstructive sleep apnea Pt with severe OSA with AHI 33 and he has good compliance and download with autocpap 5-20 cm water.   Cont autocpap for now.  DL last month : 97%, AHI  2.6.  Lung nodule Patient with 50-pack-year smoking history at least, quit a week ago. He had a lung cancer screening CAT scan which showed a suspicious 13 mm right upper lung nodule as well as a right hilar and right subcarinal lymphadenopathy.  He has a lot of comorbidities with nonischemic cardiomyopathy, EF 30%, chronic atrial fibrillation, COPD (not on meds), severe OSA compliant on cpap.   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  Incidental right shoulder rotator cuff cough tendinitis the last year. 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  PET scan showed hypermetabolic RUL as well as 4R and 7 LN. (-) other lesions seen  He had bronchoscopy with EBUS with Dr. Ashok Cordia.  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   Discussed results with patient again.  Pt has an appointment to see Dr. Earlie Server next week.         Patient will follow up with [place] in 3-4 mos    J. Shirl Harris, MD 08/18/2016   9:31 AM Pulmonary and [place] Pager: [phone] Office: [phone], Fax: [phone]

## 2016-08-18 NOTE — Patient Instructions (Signed)
It was a pleasure taking care of you today!  You are diagnosed with Chronic Obstructive Pulmonary Disease or COPD.   COPD is a preventable and treatable disease that makes it difficult to empty air out of the lungs (airflow obstruction).  This can lead to shortness of breath.   Sometimes, when you have a lung infection, this can make your breathing worse, and will cause you to have a COPD flare-up or an acute exacerbation of COPD. Please call your primary care doctor or the office if you are having a COPD flare-up.   Smoking makes COPD worse.   Make sure you use your medications for COPD --  Rescue medications: Albuterol 2 puffs every 4 hours as needed for shortness of breath.   Please call the office if you are having issues with your medications  You are also diagnosed with Lung Cancer. Please make sure you keep your appointment on May 24th  to see the cancer doctor.  Return to clinic in 3-4 months to see Dr. Ashok Cordia.

## 2016-08-21 ENCOUNTER — Encounter: Payer: Self-pay | Admitting: *Deleted

## 2016-08-21 NOTE — Progress Notes (Signed)
Oncology Nurse Navigator Documentation  Oncology Nurse Navigator Flowsheets 08/21/2016  Navigator Location [facility]  Navigator Encounter Type Other/per Dr. Worthy Flank request, I notified pathology to send tissue obtained on 08/09/16.  Treatment Phase Pre-Tx/Tx Discussion  Barriers/Navigation Needs Coordination of Care  Interventions Coordination of Care  Coordination of Care Other  Acuity Level 2  Time Spent with Patient 15

## 2016-08-22 ENCOUNTER — Encounter: Payer: Self-pay | Admitting: *Deleted

## 2016-08-22 ENCOUNTER — Other Ambulatory Visit: Payer: Self-pay | Admitting: Internal Medicine

## 2016-08-22 NOTE — Progress Notes (Signed)
Oncology Nurse Navigator Documentation  Oncology Nurse Navigator Flowsheets 08/22/2016  Navigator Location [facility]  Navigator Encounter Type Other/pathology dept updated me there is not enough tissue for molecular testing.   Treatment Phase Pre-Tx/Tx Discussion  Barriers/Navigation Needs Coordination of Care  Interventions Coordination of Care  Coordination of Care Other  Acuity Level 2  Time Spent with Patient 15

## 2016-08-23 DIAGNOSIS — C3411 Malignant neoplasm of upper lobe, right bronchus or lung: Secondary | ICD-10-CM | POA: Insufficient documentation

## 2016-08-23 DIAGNOSIS — C3491 Malignant neoplasm of unspecified part of right bronchus or lung: Secondary | ICD-10-CM

## 2016-08-23 HISTORY — DX: Malignant neoplasm of unspecified part of right bronchus or lung: C34.91

## 2016-08-24 ENCOUNTER — Ambulatory Visit (HOSPITAL_BASED_OUTPATIENT_CLINIC_OR_DEPARTMENT_OTHER): Payer: Managed Care, Other (non HMO) | Admitting: Internal Medicine

## 2016-08-24 ENCOUNTER — Ambulatory Visit
Admission: RE | Admit: 2016-08-24 | Discharge: 2016-08-24 | Disposition: A | Discharge status: Home / Self Care | Payer: 59 | Source: Ambulatory Visit | Attending: Radiation Oncology | Admitting: Radiation Oncology

## 2016-08-24 ENCOUNTER — Encounter: Payer: Self-pay | Admitting: *Deleted

## 2016-08-24 ENCOUNTER — Encounter: Payer: Self-pay | Admitting: Internal Medicine

## 2016-08-24 ENCOUNTER — Ambulatory Visit: Payer: Managed Care, Other (non HMO) | Attending: Internal Medicine | Admitting: Physical Therapy

## 2016-08-24 ENCOUNTER — Other Ambulatory Visit (HOSPITAL_BASED_OUTPATIENT_CLINIC_OR_DEPARTMENT_OTHER): Payer: Managed Care, Other (non HMO)

## 2016-08-24 VITALS — BP 147/76 | HR 86 | Temp 98.1°F | Resp 18 | Ht 73.0 in | Wt 288.3 lb

## 2016-08-24 DIAGNOSIS — J449 Chronic obstructive pulmonary disease, unspecified: Secondary | ICD-10-CM | POA: Insufficient documentation

## 2016-08-24 DIAGNOSIS — C3411 Malignant neoplasm of upper lobe, right bronchus or lung: Secondary | ICD-10-CM

## 2016-08-24 DIAGNOSIS — E669 Obesity, unspecified: Secondary | ICD-10-CM | POA: Insufficient documentation

## 2016-08-24 DIAGNOSIS — C3491 Malignant neoplasm of unspecified part of right bronchus or lung: Secondary | ICD-10-CM

## 2016-08-24 DIAGNOSIS — Z8249 Family history of ischemic heart disease and other diseases of the circulatory system: Secondary | ICD-10-CM | POA: Insufficient documentation

## 2016-08-24 DIAGNOSIS — I4891 Unspecified atrial fibrillation: Secondary | ICD-10-CM | POA: Diagnosis not present

## 2016-08-24 DIAGNOSIS — Z87891 Personal history of nicotine dependence: Secondary | ICD-10-CM | POA: Insufficient documentation

## 2016-08-24 DIAGNOSIS — Z801 Family history of malignant neoplasm of trachea, bronchus and lung: Secondary | ICD-10-CM | POA: Diagnosis not present

## 2016-08-24 DIAGNOSIS — I429 Cardiomyopathy, unspecified: Secondary | ICD-10-CM | POA: Diagnosis not present

## 2016-08-24 DIAGNOSIS — I5022 Chronic systolic (congestive) heart failure: Secondary | ICD-10-CM | POA: Insufficient documentation

## 2016-08-24 DIAGNOSIS — Z510 Encounter for antineoplastic radiation therapy: Secondary | ICD-10-CM | POA: Diagnosis present

## 2016-08-24 DIAGNOSIS — Z7901 Long term (current) use of anticoagulants: Secondary | ICD-10-CM | POA: Insufficient documentation

## 2016-08-24 DIAGNOSIS — Z95810 Presence of automatic (implantable) cardiac defibrillator: Secondary | ICD-10-CM | POA: Insufficient documentation

## 2016-08-24 DIAGNOSIS — R29810 Facial weakness: Secondary | ICD-10-CM | POA: Insufficient documentation

## 2016-08-24 DIAGNOSIS — Z7189 Other specified counseling: Secondary | ICD-10-CM

## 2016-08-24 DIAGNOSIS — I428 Other cardiomyopathies: Secondary | ICD-10-CM | POA: Diagnosis not present

## 2016-08-24 DIAGNOSIS — R590 Localized enlarged lymph nodes: Secondary | ICD-10-CM | POA: Diagnosis not present

## 2016-08-24 DIAGNOSIS — R293 Abnormal posture: Secondary | ICD-10-CM | POA: Diagnosis present

## 2016-08-24 DIAGNOSIS — Z5111 Encounter for antineoplastic chemotherapy: Secondary | ICD-10-CM | POA: Insufficient documentation

## 2016-08-24 DIAGNOSIS — I110 Hypertensive heart disease with heart failure: Secondary | ICD-10-CM | POA: Diagnosis not present

## 2016-08-24 DIAGNOSIS — G4733 Obstructive sleep apnea (adult) (pediatric): Secondary | ICD-10-CM | POA: Insufficient documentation

## 2016-08-24 DIAGNOSIS — Z79899 Other long term (current) drug therapy: Secondary | ICD-10-CM | POA: Insufficient documentation

## 2016-08-24 DIAGNOSIS — E039 Hypothyroidism, unspecified: Secondary | ICD-10-CM | POA: Diagnosis not present

## 2016-08-24 DIAGNOSIS — E785 Hyperlipidemia, unspecified: Secondary | ICD-10-CM | POA: Diagnosis not present

## 2016-08-24 LAB — COMPREHENSIVE METABOLIC PANEL
ALT: 14 U/L (ref 0–55)
ANION GAP: 7 meq/L (ref 3–11)
AST: 17 U/L (ref 5–34)
Albumin: 3.6 g/dL (ref 3.5–5.0)
Alkaline Phosphatase: 158 U/L — ABNORMAL HIGH (ref 40–150)
BUN: 22.1 mg/dL (ref 7.0–26.0)
CALCIUM: 9.7 mg/dL (ref 8.4–10.4)
CHLORIDE: 106 meq/L (ref 98–109)
CO2: 30 mEq/L — ABNORMAL HIGH (ref 22–29)
CREATININE: 1.6 mg/dL — AB (ref 0.7–1.3)
EGFR: 50 mL/min/{1.73_m2} — AB (ref >90)
Glucose: 95 mg/dl (ref 70–140)
POTASSIUM: 3.4 meq/L — AB (ref 3.5–5.1)
Sodium: 143 mEq/L (ref 136–145)
Total Bilirubin: 0.44 mg/dL (ref 0.20–1.20)
Total Protein: 7.2 g/dL (ref 6.4–8.3)

## 2016-08-24 LAB — CBC WITH DIFFERENTIAL/PLATELET
BASO%: 0.1 % (ref 0.0–2.0)
BASOS ABS: 0 10*3/uL (ref 0.0–0.1)
EOS%: 1.7 % (ref 0.0–7.0)
Eosinophils Absolute: 0.1 10*3/uL (ref 0.0–0.5)
HEMATOCRIT: 39.7 % (ref 38.4–49.9)
HGB: 12.6 g/dL — ABNORMAL LOW (ref 13.0–17.1)
LYMPH#: 1.1 10*3/uL (ref 0.9–3.3)
LYMPH%: 15.8 % (ref 14.0–49.0)
MCH: 29.2 pg (ref 27.2–33.4)
MCHC: 31.7 g/dL — AB (ref 32.0–36.0)
MCV: 91.9 fL (ref 79.3–98.0)
MONO#: 0.7 10*3/uL (ref 0.1–0.9)
MONO%: 10 % (ref 0.0–14.0)
NEUT#: 5 10*3/uL (ref 1.5–6.5)
NEUT%: 72.4 % (ref 39.0–75.0)
PLATELETS: 185 10*3/uL (ref 140–400)
RBC: 4.32 10*6/uL (ref 4.20–5.82)
RDW: 13.7 % (ref 11.0–14.6)
WBC: 6.9 10*3/uL (ref 4.0–10.3)

## 2016-08-24 MED ORDER — PROCHLORPERAZINE MALEATE 10 MG PO TABS: 10.0000 mg | ORAL_TABLET | Freq: Four times a day (QID) | ORAL | 0 refills | Status: DC | PRN

## 2016-08-24 NOTE — Therapy (Signed)
[place], [place], [address] Phone: [phone]   Fax:  [phone]  Physical Therapy Evaluation  Patient Details  Name: Jason Russell MRN: [id number] Date of Birth: Mar 19, 1951 Referring Provider: Dr. Curt Bears  Encounter Date: 08/24/2016      PT End of Session - 08/24/16 1756    Visit Number 1   Number of Visits 1   PT Start Time 3154   PT Stop Time 1636   PT Time Calculation (min) 23 min   Activity Tolerance Patient tolerated treatment well   Behavior During Therapy [facility] for tasks assessed/performed      Past Medical History:  Diagnosis Date  . Adenocarcinoma of right lung, stage 3 ([place]) 08/23/2016  . Adenocarcinoma of right lung, stage 3 ([place]) 08/23/2016  . Anemia   . Arthritis    "hx right hip"  . Asthma    "when I was a child"  . Atrial fibrillation ([facility])    Amiodarone started 10/2011; Coumadin  . 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 St. Jude ICD implantation 10/03/12.  . Chronic anticoagulation   . Chronic systolic heart failure ([place])    a. Echo 7/13: EF 25%;  b. echo 04/2012:  Mild LVH, EF 30-35%, Gr 1 DD, mild AI, mild MR, mild LAE  . COPD (chronic obstructive pulmonary disease) ([place])   . Dyslipidemia   . Dysrhythmia   . Encounter for antineoplastic chemotherapy 08/24/2016  . Gout   . History of blood transfusion 10/15/2013   "don't know where the blood's going; HgB down to 5"  . Hyperlipidemia   . Hypertension   . Hypothyroidism   . NICM (nonischemic cardiomyopathy) ([place])    [place] 4/14:  minimal CAD  . Obesity   . OSA on CPAP   . Tobacco abuse     Past Surgical History:  Procedure Laterality Date  . CARDIAC DEFIBRILLATOR PLACEMENT  2014  . CARDIOVERSION  2011  . COLONOSCOPY WITH PROPOFOL Left 10/17/2013   Procedure: COLONOSCOPY WITH PROPOFOL;  Surgeon: Inda Castle, MD;  Location: [facility];  Service: Endoscopy;  Laterality:  Left;  . ESOPHAGOGASTRODUODENOSCOPY N/A 10/17/2013   Procedure: ESOPHAGOGASTRODUODENOSCOPY (EGD);  Surgeon: Inda Castle, MD;  Location: [facility];  Service: Endoscopy;  Laterality: N/A;  . GIVENS CAPSULE STUDY N/A 10/29/2013   Procedure: GIVENS CAPSULE STUDY;  Surgeon: Inda Castle, MD;  Location: [facility];  Service: Endoscopy;  Laterality: N/A;  . IMPLANTABLE CARDIOVERTER DEFIBRILLATOR IMPLANT Left 10/03/2012   Procedure: IMPLANTABLE CARDIOVERTER DEFIBRILLATOR IMPLANT;  Surgeon: Deboraha Sprang, MD;  Location: [facility] CATH LAB;  Service: Cardiovascular;  Laterality: Left;  . JOINT REPLACEMENT    . TONSILLECTOMY  1950's  . TOTAL HIP ARTHROPLASTY Right 11/25/1997  . VIDEO BRONCHOSCOPY WITH ENDOBRONCHIAL ULTRASOUND N/A 08/09/2016   Procedure: VIDEO BRONCHOSCOPY WITH ENDOBRONCHIAL ULTRASOUND;  Surgeon: Javier Glazier, MD;  Location: [facility] OR;  Service: Thoracic;  Laterality: N/A;    There were no vitals filed for this visit.       Subjective Assessment - 08/24/16 1747    Subjective I have a rotator cuff tear but I need to take care of this cancer first.   Pertinent History Abnormal low dose CT screening 07/11/16 led to further workup and diagnosis of malignant cells consistent with adenocarcinoma inf 4R lymph node; scan shows an apical lung lesion and other positive nodes, so stage IIIA.  He is to have another biopsy done and then expects  to be treated with chemoradiation.  He has an implanted defibrillator; arthritis with h/o right THA in 1999; COPD; HTN; A-fib; ex-smoker who quit in April 2018.   Patient Stated Goals get info from all lung clinic providers   Currently in Pain? No/denies            [facility] PT Assessment - 08/24/16 0001      Assessment   Medical Diagnosis malignant cell consistent with adenocarcinoma of lung   Referring Provider Dr. Curt Bears   Onset Date/Surgical Date 07/11/16   Prior Therapy none     Precautions   Precautions Other (comment)   Precaution  Comments cancer precautions     Restrictions   Weight Bearing Restrictions No     Balance Screen   Has the patient fallen in the past 6 months No   Has the patient had a decrease in activity level because of a fear of falling?  No   Is the patient reluctant to leave their home because of a fear of falling?  No     Home Ecologist residence   Living Arrangements Spouse/significant other  is caretaker for his wife, who has dementia   Type of [place] One level     Prior Function   Level of Independence Independent   Leisure no regular exercise; goes fishing     Cognition   Overall Cognitive Status Within Functional Limits for tasks assessed     Observation/Other Assessments   Observations very limited right shoulder function is apparent     Functional Tests   Functional tests Sit to Stand     Sit to Stand   Comments 5 times in 30 seconds, well below average for his age; this looked quite difficult for him, but he denied pain, saying it was just hard to lift himself up     Posture/Postural Control   Posture/Postural Control Postural limitations   Postural Limitations Flexed trunk  slight     ROM / Strength   AROM / PROM / Strength AROM     AROM   Overall AROM Comments Very limited active movement of right shoulder; trunk AROM in standing is [facility] all directions except extension limited 25%     Ambulation/Gait   Ambulation/Gait Yes   Ambulation/Gait Assistance 7: Independent     Balance   Balance Assessed Yes     Dynamic Standing Balance   Dynamic Standing - Comments reaches forward 12 inches in standing, below average for age                           PT Education - 08/24/16 1756    Education provided Yes   Education Details energy conservation, walking, Cure article on staying active, posture, breathing, PT info   Person(s) Educated Patient   Methods Explanation;Handout   Comprehension Verbalized  understanding               Lung Clinic Goals - 08/24/16 1800      Patient will be able to verbalize understanding of the benefit of exercise to decrease fatigue.   Status Achieved     Patient will be able to verbalize the importance of posture.   Status Achieved     Patient will be able to demonstrate diaphragmatic breathing for improved lung function.   Status Achieved     Patient will be able to verbalize understanding of the role  of physical therapy to prevent functional decline and who to contact if physical therapy is needed.   Status Achieved             Plan - 08/24/16 1756    Clinical Impression Statement This is a pleasant gentleman with new diagnosis of lung cancer who will have further biopsy, but then is expected to be treated with chemoradiation.  He stands with slight forward flexion, of which he is not aware; his forward reach in standing is below normal and his performance on 30-second sit to stand is well below average for his age.  Eval is moderate with multiple comorbidities including arthrities, A-fib with implanted defibrillator, COPD; evolving as he is just starting cancer treatment.   Rehab Potential Good   PT Frequency One time visit   PT Treatment/Interventions Patient/family education   PT Next Visit Plan None at this time.   PT Home Exercise Plan walking, breathing exercises   Consulted and Agree with Plan of Care Patient      Patient will benefit from skilled therapeutic intervention in order to improve the following deficits and impairments:  Decreased strength, Decreased activity tolerance, Postural dysfunction  Visit Diagnosis: Facial weakness - Plan: PT plan of care cert/re-cert  Abnormal posture - Plan: PT plan of care cert/re-cert     Problem List Patient Active Problem List   Diagnosis Date Noted  . Encounter for antineoplastic chemotherapy 08/24/2016  . Goals of care, counseling/discussion 08/24/2016  . Adenocarcinoma of  right lung, stage 3 ([place]) 08/23/2016  . Lung nodule < 6cm on CT   . Mediastinal adenopathy   . Lung nodule 07/21/2016  . Right rotator cuff tear 06/05/2016  . Obesity 10/07/2015  . COPD (chronic obstructive pulmonary disease) ([place]) 10/07/2015  . Exertional dyspnea 10/07/2015  . Anemia 10/29/2013  . Internal hemorrhoids without mention of complication 33/29/5188  . Iron deficiency anemia, unspecified 10/16/2013  . Symptomatic anemia 10/15/2013  . Nonischemic cardiomyopathy ([place]) 08/16/2012  . Shortness of breath dyspnea 11/01/2011  . Chronic anticoagulation 11/01/2011  . Chronic systolic heart failure ([place]) 11/01/2011  . Obstructive sleep apnea 12/20/2009  . HYPERPOTASSEMIA 08/04/2009  . HYPOPOTASSEMIA 07/26/2009  . Hyperlipidemia 07/15/2009  . OVERWEIGHT/OBESITY 07/15/2009  . TOBACCO ABUSE 07/15/2009  . HYPERTENSION, BENIGN 07/15/2009    Melvina Pangelinan 08/24/2016, 6:02 PM  [place], [place], [address] Phone: [phone]   Fax:  [phone]  Name: Santiel Topper MRN: [id number] Date of Birth: 10-02-1950  Serafina Royals, PT 08/24/16 6:02 PM

## 2016-08-24 NOTE — Progress Notes (Signed)
START ON PATHWAY REGIMEN - Non-Small Cell Lung     Administer weekly:     Paclitaxel      Carboplatin   **Always confirm dose/schedule in your pharmacy ordering system**    Patient Characteristics: Stage III - Unresectable, PS = 0, 1 AJCC T Category: T1a Current Disease Status: No Distant Mets or Local Recurrence AJCC N Category: N2 AJCC M Category: M0 AJCC 8 Stage Grouping: IIIA Performance Status: PS = 0, 1  Intent of Therapy: Curative Intent, Discussed with Patient

## 2016-08-24 NOTE — Progress Notes (Signed)
[facility] [phone]   [phone] Multidisciplinary thoracic oncology clinic  CONSULT NOTE  REFERRING PHYSICIAN: Dr. Tera Partridge  REASON FOR CONSULTATION:  66 years old African-American male recently diagnosed with lung cancer.  HPI Jason Russell is a 66 y.o. male was past medical history significant for anemia, osteoarthritis, status post AICD, COPD, gout, hypertension, dyslipidemia, hypothyroidism and nonischemic cardiomyopathy as well as long history of smoking. The patient was seen by his primary care physician in April 2018 and she ordered CT screening scan of the chest because of his long smoking history. The scan was performed on 07/11/2016 and it showed slightly irregular right apical pulmonary nodule measuring 1.36 cm. There was also moderate right paratracheal adenopathy measuring 1.5 cm, subcarinal adenopathy measuring 2.1 cm. The patient was referred to pulmonary medicine and was seen by Dr. Corrie Dandy. A PET scan was performed on 07/31/2016 and it showed 1.4 cm pulmonary nodule identified in the posterior right upper lobe markedly hypermetabolic with SUV max of 15. There was also hypermetabolic mediastinal lymphadenopathy identified including the right peritracheal 2.3 cm short axis lymph node with SUV max of 28 and 2.4 cm short axis subcarinal lymph node with SUV max of 25. The patient was seen by Dr. Ashok Cordia. He underwent bronchoscopy with endobronchial ultrasound and needle aspiration on 08/09/2016. The final pathology ([id number]) from 4R and ([id number]) from level 7 lymph nodes were consistent with adenocarcinoma. The tumor cells were positive for TTF-1 and cytokeratin 7 but negative for cytokeratin 20, cytokeratin 5/6, p63, CDX2 and Prostein. There was insufficient material for molecular studies. Dr. Ashok Cordia kindly referred the patient to the multidisciplinary thoracic oncology clinic today for evaluation and discussion of his treatment  options. When seen today the patient is very anxious. He denied having any chest pain but continues to have shortness breath with exertion with no cough or hemoptysis. He denied having any weight loss or night sweats. He has no headache or visual changes. He denied having any nausea, vomiting, diarrhea or constipation. Family history significant for mother with heart disease and Brother with lung cancer. The patient is married and has 1 daughter. He lives in [place]. He is a retired Special educational needs teacher. He has a history of smoking 1 pack per day for around 48 years and quit 07/11/2016. He drinks alcohol occasionally and no history of drug abuse.  HPI  Past Medical History:  Diagnosis Date  . Anemia   . Arthritis    "hx right hip"  . Asthma    "when I was a child"  . Atrial fibrillation (HCC)    Amiodarone started 10/2011; Coumadin  . 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 St. Jude ICD implantation 10/03/12.  . Chronic anticoagulation   . Chronic systolic heart failure ([place])    a. Echo 7/13: EF 25%;  b. echo 04/2012:  Mild LVH, EF 30-35%, Gr 1 DD, mild AI, mild MR, mild LAE  . COPD (chronic obstructive pulmonary disease) ([place])   . Dyslipidemia   . Dysrhythmia   . Gout   . History of blood transfusion 10/15/2013   "don't know where the blood's going; HgB down to 5"  . Hyperlipidemia   . Hypertension   . Hypothyroidism   . NICM (nonischemic cardiomyopathy) ([place])    [facility] 4/14:  minimal CAD  . Obesity   . OSA on CPAP   . Tobacco abuse     Past Surgical History:  Procedure Laterality Date  .  CARDIAC DEFIBRILLATOR PLACEMENT  2014  . CARDIOVERSION  2011  . COLONOSCOPY WITH PROPOFOL Left 10/17/2013   Procedure: COLONOSCOPY WITH PROPOFOL;  Surgeon: Inda Castle, MD;  Location: [facility];  Service: Endoscopy;  Laterality: Left;  . ESOPHAGOGASTRODUODENOSCOPY N/A 10/17/2013   Procedure: ESOPHAGOGASTRODUODENOSCOPY (EGD);  Surgeon: Inda Castle,  MD;  Location: [facility];  Service: Endoscopy;  Laterality: N/A;  . GIVENS CAPSULE STUDY N/A 10/29/2013   Procedure: GIVENS CAPSULE STUDY;  Surgeon: Inda Castle, MD;  Location: [facility];  Service: Endoscopy;  Laterality: N/A;  . IMPLANTABLE CARDIOVERTER DEFIBRILLATOR IMPLANT Left 10/03/2012   Procedure: IMPLANTABLE CARDIOVERTER DEFIBRILLATOR IMPLANT;  Surgeon: Deboraha Sprang, MD;  Location: [facility] CATH LAB;  Service: Cardiovascular;  Laterality: Left;  . JOINT REPLACEMENT    . TONSILLECTOMY  1950's  . TOTAL HIP ARTHROPLASTY Right 11/25/1997  . VIDEO BRONCHOSCOPY WITH ENDOBRONCHIAL ULTRASOUND N/A 08/09/2016   Procedure: VIDEO BRONCHOSCOPY WITH ENDOBRONCHIAL ULTRASOUND;  Surgeon: Javier Glazier, MD;  Location: [facility] OR;  Service: Thoracic;  Laterality: N/A;    Family History  Problem Relation Age of Onset  . Hypertension Mother   . Other Father        deceased- unknow reason  . Lung cancer Brother     Social History Social History  Substance Use Topics  . Smoking status: Former Smoker    Packs/day: 0.25    Years: 45.00    Types: Cigarettes    Quit date: 07/11/2016  . Smokeless tobacco: Never Used  . Alcohol use 0.6 oz/week    1 Cans of beer per week    Allergies  Allergen Reactions  . No Known Allergies     Current Outpatient Prescriptions  Medication Sig Dispense Refill  . amiodarone (PACERONE) 100 MG tablet Take 1 tablet (100 mg total) by mouth daily. 90 tablet 2  . atorvastatin (LIPITOR) 20 MG tablet TAKE 1 TABLET BY MOUTH EVERY DAY 90 tablet 3  . buPROPion (WELLBUTRIN SR) 150 MG 12 hr tablet TAKE 1 TABLET (150 MG TOTAL) BY MOUTH 2 (TWO) TIMES DAILY. (Patient taking differently: Take 150 mg by mouth 2 (two) times daily. ) 180 tablet 2  . carvedilol (COREG) 12.5 MG tablet TAKE 1 TABLET (12.5 MG TOTAL) BY MOUTH 2 (TWO) TIMES DAILY. 180 tablet 3  . colchicine 0.6 MG tablet Take 1 tablet by mouth as needed (gout flares).     . ferrous gluconate (FERGON) 324 MG tablet Take 1  tablet (324 mg total) by mouth 3 (three) times daily with meals. 90 tablet 8  . furosemide (LASIX) 20 MG tablet Take 3 tabs (60 mg) every Tue and Sat, all other days take 2 tabs (40 mg) (Patient taking differently: Take 40-60 mg by mouth See admin instructions. Take 3 tabs (60 mg) every Tue and Sat, all other days take 2 tabs (40 mg)) 64 tablet 6  . hydrALAZINE (APRESOLINE) 100 MG tablet Take 1 tablet by mouth 3 (three) times daily.  1  . levothyroxine (SYNTHROID, LEVOTHROID) 50 MCG tablet TAKE 1 TABLET (50 MCG TOTAL) BY MOUTH DAILY BEFORE BREAKFAST. 90 tablet 3  . lisinopril (PRINIVIL,ZESTRIL) 40 MG tablet TAKE 1 TABLET BY MOUTH DAILY. 90 tablet 1  . Polyvinyl Alcohol-Povidone (REFRESH OP) Apply 1 drop to eye daily.    . potassium chloride SA (KLOR-CON M20) 20 MEQ tablet Take 1 tablet every day.  On days when lasix is taken, take 2 tablets (Patient taking differently: Take 20 mEq by mouth See admin instructions. Take 1  tablet every day.  On days when lasix is taken, take 2 tablets) 135 tablet 3  . rivaroxaban (XARELTO) 20 MG TABS tablet Take 1 tablet (20 mg total) by mouth daily. 90 tablet 2  . spironolactone (ALDACTONE) 25 MG tablet TAKE 0.5 TABLETS (12.5 MG TOTAL) BY MOUTH DAILY. 45 tablet 1  . ULORIC 40 MG tablet Take 1 tablet by mouth daily.    . Vitamin D, Ergocalciferol, (DRISDOL) 50000 units CAPS capsule Take 1 capsule by mouth once a week. Monday    . acetaminophen (TYLENOL) 500 MG tablet Take 1,000 mg by mouth every 6 (six) hours as needed for mild pain.    [name] albuterol (PROVENTIL HFA;VENTOLIN HFA) 108 (90 Base) MCG/ACT inhaler Inhale 2 puffs into the lungs every 4 (four) hours as needed for wheezing or shortness of breath. (Patient not taking: Reported on 08/24/2016) 1 Inhaler 2  . [employer] D3 2000 units CAPS Take 1 capsule by mouth daily.  11  . sildenafil (REVATIO) 20 MG tablet Take 1 tablet (20 mg total) by mouth at bedtime. Take 2-3 tablets daily prn for ED 30 tablet 2   No current  facility-administered medications for this visit.     Review of Systems  Constitutional: positive for fatigue Eyes: negative Ears, nose, mouth, throat, and face: negative Respiratory: positive for dyspnea on exertion Cardiovascular: negative Gastrointestinal: negative Genitourinary:negative Integument/breast: negative Hematologic/lymphatic: negative Musculoskeletal:negative Neurological: negative Behavioral/Psych: negative Endocrine: negative Allergic/Immunologic: negative  Physical Exam  NFA:OZHYQ, healthy, no distress, well nourished, well developed and anxious SKIN: skin color, texture, turgor are normal, no rashes or significant lesions HEAD: Normocephalic, No masses, lesions, tenderness or abnormalities EYES: normal, PERRLA, Conjunctiva are pink and non-injected EARS: External ears normal, Canals clear OROPHARYNX:no exudate, no erythema and lips, buccal mucosa, and tongue normal  NECK: supple, no adenopathy, no JVD LYMPH:  no palpable lymphadenopathy, no hepatosplenomegaly LUNGS: clear to auscultation , and palpation HEART: regular rate & rhythm, no murmurs and no gallops ABDOMEN:abdomen soft, non-tender, obese, normal bowel sounds and no masses or organomegaly BACK: No CVA tenderness, Range of motion is normal EXTREMITIES:no joint deformities, effusion, or inflammation, no edema, no skin discoloration  NEURO: alert & oriented x 3 with fluent speech, no focal motor/sensory deficits  PERFORMANCE STATUS: ECOG 1  LABORATORY DATA: Lab Results  Component Value Date   WBC 6.9 08/24/2016   HGB 12.6 (L) 08/24/2016   HCT 39.7 08/24/2016   MCV 91.9 08/24/2016   PLT 185 08/24/2016      Chemistry      Component Value Date/Time   NA 140 08/04/2016 1302   NA 146 (H) 07/21/2016 1426   K 3.3 (L) 08/04/2016 1302   CL 105 08/04/2016 1302   CO2 27 08/04/2016 1302   BUN 19 08/04/2016 1302   BUN 28 (H) 07/21/2016 1426   CREATININE 1.43 (H) 08/04/2016 1302   CREATININE 1.76  (H) 02/15/2016 0839      Component Value Date/Time   CALCIUM 9.0 08/04/2016 1302   ALKPHOS 118 (H) 05/11/2016 1445   AST 14 05/11/2016 1445   ALT 8 05/11/2016 1445   BILITOT 0.4 05/11/2016 1445       RADIOGRAPHIC STUDIES: Nm Pet Image Initial (pi) Skull Base To Thigh  Result Date: 07/31/2016 CLINICAL DATA:  Initial treatment strategy for pulmonary nodule. EXAM: NUCLEAR MEDICINE PET SKULL BASE TO THIGH TECHNIQUE: 13.9 mCi F-18 FDG was injected intravenously. Full-ring PET imaging was performed from the skull base to thigh after the radiotracer. CT data was  obtained and used for attenuation correction and anatomic localization. FASTING BLOOD GLUCOSE:  Value: 98 mg/dl COMPARISON:  Chest CT 07/11/2016 FINDINGS: NECK No hypermetabolic lymph nodes in the neck. CHEST 1.4 cm pulmonary nodule identified in the posterior right upper lobe on recent Lung cancer screening study is markedly hypermetabolic with SUV max = 15. No other hypermetabolic pulmonary nodule or mass. Hypermetabolic mediastinal lymphadenopathy is identified. Right paratracheal 2.3 cm short axis lymph node demonstrates SUV max = 28. 2.4 cm short axis subcarinal lymph node demonstrates SUV max = 25. Left permanent pacemaker noted. ABDOMEN/PELVIS No abnormal hypermetabolic activity within the liver, pancreas, adrenal glands, or spleen. No hypermetabolic lymph nodes in the abdomen or pelvis. Nonobstructing renal stones noted bilaterally. There is abdominal aortic atherosclerosis without aneurysm. Left common iliac artery measures 2.3 cm diameter. SKELETON No focal hypermetabolic activity to suggest skeletal metastasis. Patient is status post right hip replacement. IMPRESSION: 1. Markedly hypermetabolic posterior right upper lobe pulmonary lesion consistent with malignancy and hypermetabolic mediastinal lymph node metastases. Electronically Signed   By: Misty Stanley M.D.   On: 07/31/2016 13:12   Dg Chest Portable 1 View  Result Date:  08/09/2016 CLINICAL DATA:  Shortness of breath. EXAM: PORTABLE CHEST 1 VIEW COMPARISON:  None. FINDINGS: 1224 hours. Low volume film. The cardio pericardial silhouette is enlarged. There is pulmonary vascular congestion without overt pulmonary edema. No evidence for pneumothorax or substantial pleural effusion. Left-sided AICD again noted. IMPRESSION: 1. No evidence for pneumothorax. 2. Cardiomegaly with vascular congestion. 3. Known hypermetabolic posterior right upper lobe lesion not well seen by x-ray. Electronically Signed   By: Misty Stanley M.D.   On: 08/09/2016 12:38    ASSESSMENT: This is a very pleasant 66 years old African-American male recently diagnosed with a stage IIIa (T1a, N2, M0) non-small cell lung cancer, adenocarcinoma presented with right upper lobe lung nodule in addition to mediastinal lymphadenopathy diagnosed in March 2018. Unfortunately there is insufficient material for molecular studies.   PLAN: I had a lengthy discussion with the patient today about his current disease stage, prognosis and treatment options. I personally and independently reviewed the PET scan images and discuss the results and showed the images to the patient today. I recommended for the patient to have CT-guided core biopsy of the right upper lobe lung nodule for molecular studies. I also recommended for the patient to complete the staging workup by ordering CT scan of the head to rule out brain metastasis. The patient cannot get MRI of the brain because of his the AICD. I discussed with the patient and his treatment options. I recommended for the patient a course of concurrent chemoradiation with weekly carboplatin for AUC of 2 and paclitaxel 45 MG/M2. I discussed with the patient adverse effect of the chemotherapy including but not limited to alopecia, myelosuppression, nausea and vomiting, peripheral neuropathy, liver or renal dysfunction. I will arrange for the patient to have a chemotherapy education  class before starting the first dose of his chemotherapy. The patient will see Dr. Tammi Klippel later today for evaluation and discussion of the radiotherapy option. He is expected to start the first dose of his concurrent chemoradiation on 09/04/2016. I will arrange for the patient to come back for follow-up visit in 3 weeks for reevaluation and management of any adverse effect of his treatment. I will call his pharmacy with prescription for Compazine 10 mg by mouth every 6 hours as needed for nausea. The patient was seen during the multidisciplinary thoracic oncology clinic today by  medical oncology, radiation oncology, thoracic navigator, social worker and physical therapist. For the cardiomyopathy he will continue his current treatment with Cecilie Lowers, amiodarone and Lasix. He was advised to call immediately if he has any concerning symptoms in the interval. The patient voices understanding of current disease status and treatment options and is in agreement with the current care plan. All questions were answered. The patient knows to call the clinic with any problems, questions or concerns. We can certainly see the patient much sooner if necessary. Thank you so much for allowing me to participate in the care of Jeramy Dimmick. I will continue to follow up the patient with you and assist in his care.  I spent 55 minutes counseling the patient face to face. The total time spent in the appointment was 80 minutes.  Disclaimer: This note was dictated with voice recognition software. Similar sounding words can inadvertently be transcribed and may not be corrected upon review.   Meri Pelot K. Aug 24, 2016, 3:11 PM

## 2016-08-24 NOTE — Progress Notes (Addendum)
[facility] Clinical Social Work  Clinical Social Work met with patient and Futures trader at [employer] appointment to offer support and assess for psychosocial needs.  Medical oncologist reviewed patient's diagnosis and recommended treatment plan with patient.  Mr. Heist identified his main support system as a friend who serves as a Environmental education officer, his first cousin, and his ex-wife.  He is retired from [employer] and enjoys fishing.  Mr. Vandehei has no concerns at this time.  He indicated he does not have information concerns after meeting with medical oncology.     ONCBCN DISTRESS SCREENING 08/24/2016  Screening Type Initial Screening  Distress experienced in past week (1-10) 3  Family Problem type Partner  Emotional problem type Nervousness/Anxiety  Information Concerns Type Lack of info about diagnosis;Lack of info about treatment    Clinical Social Work briefly discussed Clinical Social Work role and [employer] support programs/services.  Clinical Social Work encouraged patient to call with any additional questions or concerns.   Maryjean Morn, MSW, LCSW, OSW-C Clinical Social Worker [facility] [phone]

## 2016-08-24 NOTE — Progress Notes (Signed)
Radiation Oncology         [phone] ________________________________  Multidisciplinary Thoracic Oncology Clinic [facility]) Initial Outpatient Consultation  Name: Jason Russell MRN: [id number]  Date: 08/24/2016  DOB: [birth year]  QQ:IWLNL, Jason Rude, MD  Jason Glazier, MD   REFERRING PHYSICIAN: Javier Glazier, MD  DIAGNOSIS: 66 y.o. 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 Primary cancer of right upper lobe of lung (HCC) 162.3 C34.11     HISTORY OF PRESENT ILLNESS::Jason Russell is a 66 y.o. 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  This was initially identified on a routine low-dose chest CT for lung cancer screening performed on 07/11/2016. This revealed a slightly irregular right apical pulmonary nodule measuring 13.6 mm as well as a smaller right upper lobe pulmonary nodule measuring 4 mm. Also noted was moderate right paratracheal and subcarinal adenopathy. This was further evaluated with a PET/CT on 07/31/2016 revealing a 1.4 cm RUL nodule with SUV max 15 and hypermetabolic mediastinal lymphadenopathy with a 2.3 cm paratracheal lymph node with SUV max of 28 and a 2.4 cm subcarinal lymph node with SUV max of 25.    The patient underwent bronchoscopy with EBUS on 08/09/2016 by Dr. Creig Hines. He was able to sample a level 4R and level 7 lymph node.  Cytology confirmed adenocarcinoma in both of the sampled lymph nodes.  The patient was referred today for presentation in the multidisciplinary conference.  Radiology studies and pathology slides were presented there for review and discussion of treatment options. A consensus was discussed regarding potential next steps.  Of note: The patient has a pacemaker/AICD in the left side of his chest placed in 2014 by Dr. Caryl Comes.  Dr. Harrington Challenger is his cardiologist.  PREVIOUS RADIATION THERAPY: No  PAST MEDICAL HISTORY:  has a past  medical history of Adenocarcinoma of right lung, stage 3 ([place]) (08/23/2016); Adenocarcinoma of right lung, stage 3 ([place]) (08/23/2016); Anemia; Arthritis; Asthma; Atrial fibrillation ([place]); Automatic implantable cardioverter-defibrillator in situ (10/03/2012  ); Chronic anticoagulation; Chronic systolic heart failure (HCC); COPD (chronic obstructive pulmonary disease) ([place]); Dyslipidemia; Dysrhythmia; Encounter for antineoplastic chemotherapy (08/24/2016); Gout; History of blood transfusion (10/15/2013); Hyperlipidemia; Hypertension; Hypothyroidism; NICM (nonischemic cardiomyopathy) ([place]); Obesity; OSA on CPAP; and Tobacco abuse.    PAST SURGICAL HISTORY: Past Surgical History:  Procedure Laterality Date  . CARDIAC DEFIBRILLATOR PLACEMENT  2014  . CARDIOVERSION  2011  . COLONOSCOPY WITH PROPOFOL Left 10/17/2013   Procedure: COLONOSCOPY WITH PROPOFOL;  Surgeon: Inda Castle, MD;  Location: [place];  Service: Endoscopy;  Laterality: Left;  . ESOPHAGOGASTRODUODENOSCOPY N/A 10/17/2013   Procedure: ESOPHAGOGASTRODUODENOSCOPY (EGD);  Surgeon: Inda Castle, MD;  Location: [place];  Service: Endoscopy;  Laterality: N/A;  . GIVENS CAPSULE STUDY N/A 10/29/2013   Procedure: GIVENS CAPSULE STUDY;  Surgeon: Inda Castle, MD;  Location: [facility];  Service: Endoscopy;  Laterality: N/A;  . IMPLANTABLE CARDIOVERTER DEFIBRILLATOR IMPLANT Left 10/03/2012   Procedure: IMPLANTABLE CARDIOVERTER DEFIBRILLATOR IMPLANT;  Surgeon: Deboraha Sprang, MD;  Location: [facility] CATH LAB;  Service: Cardiovascular;  Laterality: Left;  . JOINT REPLACEMENT    . TONSILLECTOMY  1950's  . TOTAL HIP ARTHROPLASTY Right 11/25/1997  . VIDEO BRONCHOSCOPY WITH ENDOBRONCHIAL ULTRASOUND N/A 08/09/2016   Procedure: VIDEO BRONCHOSCOPY WITH ENDOBRONCHIAL ULTRASOUND;  Surgeon: Jason Glazier, MD;  Location: [place];  Service: Thoracic;  Laterality: N/A;    FAMILY HISTORY: family history  includes Hypertension in his mother; Lung cancer in  his brother; Other in his father.  SOCIAL HISTORY:  reports that he quit smoking about 6 weeks ago. His smoking use included Cigarettes. He has a 11.25 pack-year smoking history. He has never used smokeless tobacco. He reports that he drinks about 0.6 oz of alcohol per week . He reports that he does not use drugs.  ALLERGIES: No known allergies  MEDICATIONS:  Current Outpatient Prescriptions  Medication Sig Dispense Refill  . acetaminophen (TYLENOL) 500 MG tablet Take 1,000 mg by mouth every 6 (six) hours as needed for mild pain.    [name] albuterol (PROVENTIL HFA;VENTOLIN HFA) 108 (90 Base) MCG/ACT inhaler Inhale 2 puffs into the lungs every 4 (four) hours as needed for wheezing or shortness of breath. (Patient not taking: Reported on 08/24/2016) 1 Inhaler 2  . amiodarone (PACERONE) 100 MG tablet Take 1 tablet (100 mg total) by mouth daily. 90 tablet 2  . atorvastatin (LIPITOR) 20 MG tablet TAKE 1 TABLET BY MOUTH EVERY DAY 90 tablet 3  . buPROPion (WELLBUTRIN SR) 150 MG 12 hr tablet TAKE 1 TABLET (150 MG TOTAL) BY MOUTH 2 (TWO) TIMES DAILY. (Patient taking differently: Take 150 mg by mouth 2 (two) times daily. ) 180 tablet 2  . carvedilol (COREG) 12.5 MG tablet TAKE 1 TABLET (12.5 MG TOTAL) BY MOUTH 2 (TWO) TIMES DAILY. 180 tablet 3  . colchicine 0.6 MG tablet Take 1 tablet by mouth as needed (gout flares).     . [employer] D3 2000 units CAPS Take 1 capsule by mouth daily.  11  . ferrous gluconate (FERGON) 324 MG tablet Take 1 tablet (324 mg total) by mouth 3 (three) times daily with meals. 90 tablet 8  . furosemide (LASIX) 20 MG tablet Take 3 tabs (60 mg) every Tue and Sat, all other days take 2 tabs (40 mg) (Patient taking differently: Take 40-60 mg by mouth See admin instructions. Take 3 tabs (60 mg) every Tue and Sat, all other days take 2 tabs (40 mg)) 64 tablet 6  . hydrALAZINE (APRESOLINE) 100 MG tablet Take 1 tablet by mouth 3 (three) times daily.  1  . levothyroxine (SYNTHROID, LEVOTHROID) 50 MCG  tablet TAKE 1 TABLET (50 MCG TOTAL) BY MOUTH DAILY BEFORE BREAKFAST. 90 tablet 3  . lisinopril (PRINIVIL,ZESTRIL) 40 MG tablet TAKE 1 TABLET BY MOUTH DAILY. 90 tablet 1  . Polyvinyl Alcohol-Povidone (REFRESH OP) Apply 1 drop to eye daily.    . potassium chloride SA (KLOR-CON M20) 20 MEQ tablet Take 1 tablet every day.  On days when lasix is taken, take 2 tablets (Patient taking differently: Take 20 mEq by mouth See admin instructions. Take 1 tablet every day.  On days when lasix is taken, take 2 tablets) 135 tablet 3  . prochlorperazine (COMPAZINE) 10 MG tablet Take 1 tablet (10 mg total) by mouth every 6 (six) hours as needed for nausea or vomiting. 30 tablet 0  . rivaroxaban (XARELTO) 20 MG TABS tablet Take 1 tablet (20 mg total) by mouth daily. 90 tablet 2  . sildenafil (REVATIO) 20 MG tablet Take 1 tablet (20 mg total) by mouth at bedtime. Take 2-3 tablets daily prn for ED 30 tablet 2  . spironolactone (ALDACTONE) 25 MG tablet TAKE 0.5 TABLETS (12.5 MG TOTAL) BY MOUTH DAILY. 45 tablet 1  . ULORIC 40 MG tablet Take 1 tablet by mouth daily.    . Vitamin D, Ergocalciferol, (DRISDOL) 50000 units CAPS capsule Take 1 capsule by  mouth once a week. Monday     No current facility-administered medications for this encounter.     REVIEW OF SYSTEMS:  On review of systems, the patient reports that he is doing well overall. He denies any chest pain, cough, fevers, chills, night sweats, unintended weight changes. He denies any bowel or bladder disturbances, and denies abdominal pain, nausea or vomiting. He denies any new musculoskeletal or joint aches or pains, new skin lesions or concerns. He complains of SOB. A complete review of systems is obtained and is otherwise negative.   PHYSICAL EXAM: Wt Readings from Last 3 Encounters:  08/24/16 288 lb 4.8 oz (130.8 kg)  08/18/16 285 lb 9.6 oz (129.5 kg)  08/04/16 287 lb 5 oz (130.3 kg)   Temp Readings from Last 3 Encounters:  08/24/16 98.1 F (36.7 C) (Oral)   08/09/16 97.8 F (36.6 C)  08/04/16 98.1 F (36.7 C) (Oral)   Pulse Readings from Last 3 Encounters:  08/24/16 86  08/18/16 80  08/09/16 74   In general this is a well appearing African-American male in no acute distress. He is alert and oriented x4 and appropriate throughout the examination. HEENT reveals that the patient is normocephalic, atraumatic. EOMs are intact. PERRLA. Skin is intact without any evidence of gross lesions. Cardiovascular exam reveals a regular rate and rhythym, no clicks rubs or murmurs are auscultated. Chest is clear to auscultation bilaterally. Lymphatic assessment is performed and does not reveal any adenopathy in the cervical, supraclavicular, axillary, or inguinal chains. Abdomen has active bowel sounds in all quadrants and is intact. The abdomen is soft, non tender, non distended. Lower extremities are negative for deep calf tenderness, cyanosis or clubbing.  There is 2+ pretibial pitting edema bilaterally in the LEs.  KPS = 100  100 - Normal; no complaints; no evidence of disease. 90   - Able to carry on normal activity; minor signs or symptoms of disease. 80   - Normal activity with effort; some signs or symptoms of disease. 22   - Cares for self; unable to carry on normal activity or to do active work. 60   - Requires occasional assistance, but is able to care for most of his personal needs. 50   - Requires considerable assistance and frequent medical care. 69   - Disabled; requires special care and assistance. 41   - Severely disabled; hospital admission is indicated although death not imminent. 85   - Very sick; hospital admission necessary; active supportive treatment necessary. 10   - Moribund; fatal processes progressing rapidly. 0     - Dead  Karnofsky DA, Abelmann Knowles, Craver LS and Burchenal [facility] [phone]) The use of the nitrogen mustards in the palliative treatment of carcinoma: with particular reference to bronchogenic carcinoma Cancer 1  [id number]  LABORATORY DATA:  Lab Results  Component Value Date   WBC 6.9 08/24/2016   HGB 12.6 (L) 08/24/2016   HCT 39.7 08/24/2016   MCV 91.9 08/24/2016   PLT 185 08/24/2016   Lab Results  Component Value Date   NA 143 08/24/2016   K 3.4 (L) 08/24/2016   CL 105 08/04/2016   CO2 30 (H) 08/24/2016   Lab Results  Component Value Date   ALT 14 08/24/2016   AST 17 08/24/2016   ALKPHOS 158 (H) 08/24/2016   BILITOT 0.44 08/24/2016    PULMONARY FUNCTION TEST:   Recent Review Flowsheet Data    Spirometry Latest Ref Rng & Units 07/31/2016   FVC-%PRED-PRE % 41  FVC-%PRED-POST % 52   FEV1-PRE L 1.42   FEV1-%PRED-PRE % 42   FEV1-POST L 1.69   FEV1-%PRED-POST % 49   DLCO [facility] ml/min/mmHg 19.27      RADIOGRAPHY: Nm Pet Image Initial (pi) Skull Base To Thigh  Result Date: 07/31/2016 CLINICAL DATA:  Initial treatment strategy for pulmonary nodule. EXAM: NUCLEAR MEDICINE PET SKULL BASE TO THIGH TECHNIQUE: 13.9 mCi F-18 FDG was injected intravenously. Full-ring PET imaging was performed from the skull base to thigh after the radiotracer. CT data was obtained and used for attenuation correction and anatomic localization. FASTING BLOOD GLUCOSE:  Value: 98 mg/dl COMPARISON:  Chest CT 07/11/2016 FINDINGS: NECK No hypermetabolic lymph nodes in the neck. CHEST 1.4 cm pulmonary nodule identified in the posterior right upper lobe on recent Lung cancer screening study is markedly hypermetabolic with SUV max = 15. No other hypermetabolic pulmonary nodule or mass. Hypermetabolic mediastinal lymphadenopathy is identified. Right paratracheal 2.3 cm short axis lymph node demonstrates SUV max = 28. 2.4 cm short axis subcarinal lymph node demonstrates SUV max = 25. Left permanent pacemaker noted. ABDOMEN/PELVIS No abnormal hypermetabolic activity within the liver, pancreas, adrenal glands, or spleen. No hypermetabolic lymph nodes in the abdomen or pelvis. Nonobstructing renal stones noted bilaterally. There is  abdominal aortic atherosclerosis without aneurysm. Left common iliac artery measures 2.3 cm diameter. SKELETON No focal hypermetabolic activity to suggest skeletal metastasis. Patient is status post right hip replacement. IMPRESSION: 1. Markedly hypermetabolic posterior right upper lobe pulmonary lesion consistent with malignancy and hypermetabolic mediastinal lymph node metastases. Electronically Signed   By: Misty Stanley M.D.   On: 07/31/2016 13:12   Dg Chest Portable 1 View  Result Date: 08/09/2016 CLINICAL DATA:  Shortness of breath. EXAM: PORTABLE CHEST 1 VIEW COMPARISON:  None. FINDINGS: 1224 hours. Low volume film. The cardio pericardial silhouette is enlarged. There is pulmonary vascular congestion without overt pulmonary edema. No evidence for pneumothorax or substantial pleural effusion. Left-sided AICD again noted. IMPRESSION: 1. No evidence for pneumothorax. 2. Cardiomegaly with vascular congestion. 3. Known hypermetabolic posterior right upper lobe lesion not well seen by x-ray. Electronically Signed   By: Misty Stanley M.D.   On: 08/09/2016 12:38      IMPRESSION: 66 y.o. male with stage IIIA, T1aN2M0 non small cell, adenocarcinoma of the right upper lung.  PLAN: Today, we talked to the patient about the findings and workup thus far. We discussed the natural history of non small cell carcinoma and general treatment, highlighting the role of radiotherapy in the management. We reviewed the consensus recommendation for treatment being concurrent chemoradiation. The patient will be scheduled for CT guided core biopsy on the right upper lobe lesion for additional tissue to be sent for molecular testing.  We discussed the available radiation techniques, and focused on the details of logistics and delivery. We anticipate 6 1/2 weeks of radiotherapy concurrent with systemic chemotherapy. We reviewed the anticipated acute and late sequelae associated with radiation in this setting. The patient was  encouraged to ask questions that were answered to his satisfaction. He will be scheduled for CT Head for further disease staging (no MRI due to AICD).  CT simulation is scheduled on 08/29/16 at [facility] in anticipation of beginning concurrent chemoradiation on 09/04/16.  We will contact Dr. Harrington Challenger and Dr. Caryl Comes to ensure appropriate protection of his AICD device throughout treatment.  We spent 60 minutes minutes face to face with the patient and more than 50% of that time was spent in counseling and/or coordination  of care.    Nicholos Johns, PA-C    Tyler Pita, MD  [facility] Direct Dial: [phone]  Fax: [phone] [email or web address]  Skype  [email or web address]  This document serves as a record of services personally performed by Freeman Caldron, PA-C and Tyler Pita, MD. It was created on their behalf by Darcus Austin, a trained medical scribe. The creation of this record is based on the scribe's personal observations and the providers' statements to them. This document has been checked and approved by the attending provider.

## 2016-08-25 ENCOUNTER — Encounter: Payer: Self-pay | Admitting: *Deleted

## 2016-08-25 ENCOUNTER — Ambulatory Visit: Payer: Managed Care, Other (non HMO) | Admitting: Pulmonary Disease

## 2016-08-25 DIAGNOSIS — C3411 Malignant neoplasm of upper lobe, right bronchus or lung: Secondary | ICD-10-CM

## 2016-08-25 NOTE — Progress Notes (Signed)
Oncology Nurse Navigator Documentation  Oncology Nurse Navigator Flowsheets 08/25/2016  Navigator Location [facility]  Navigator Encounter Type Clinic/MDC/I spoke with Mr. Spagnuolo yesterday at thoracic clinic.  I gave and explained information on lung cancer, resources and support at [facility], and next steps. Patient SIM on 08/29/16 and patient is aware.   Abnormal Finding Date 07/11/2016  Confirmed Diagnosis Date 08/09/2016  Multidisiplinary Clinic Date 08/24/2016  Treatment Initiated Date 08/29/2016  Patient Visit Type MedOnc  Treatment Phase Pre-Tx/Tx Discussion  Barriers/Navigation Needs Education  Education Newly Diagnosed Cancer Education;Understanding Cancer/ Treatment Options;Other  Interventions Education  Education Method Verbal;Written  Support Groups/Services American [employer]  Acuity Level 2  Acuity Level 2 Educational needs;Other  Time Spent with Patient 45

## 2016-08-29 ENCOUNTER — Ambulatory Visit
Admission: RE | Admit: 2016-08-29 | Discharge: 2016-08-29 | Disposition: A | Discharge status: Home / Self Care | Payer: 59 | Source: Ambulatory Visit | Attending: Radiation Oncology | Admitting: Radiation Oncology

## 2016-08-29 DIAGNOSIS — Z510 Encounter for antineoplastic radiation therapy: Secondary | ICD-10-CM | POA: Diagnosis not present

## 2016-08-29 DIAGNOSIS — C3411 Malignant neoplasm of upper lobe, right bronchus or lung: Secondary | ICD-10-CM

## 2016-08-30 ENCOUNTER — Telehealth: Payer: Self-pay | Admitting: Internal Medicine

## 2016-08-30 NOTE — Telephone Encounter (Signed)
Scheduled appt per 5/26 LOS - patient is aware of appts 6/4  And 6/5 and will pick up schedule on one of those days.

## 2016-08-30 NOTE — Progress Notes (Signed)
  Radiation Oncology         [phone]) [phone] ________________________________  Name: Jason Russell MRN: [id number]  Date: 08/29/2016  DOB: 1951-03-08  SIMULATION AND TREATMENT PLANNING NOTE    ICD-9-CM ICD-10-CM   1. Primary cancer of right upper lobe of lung (HCC) 162.3 C34.11     DIAGNOSIS:  66 y.o. 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  Identity was confirmed.  All relevant records and images related to the planned course of therapy were reviewed.  The patient freely provided informed written consent to proceed with treatment after reviewing the details related to the planned course of therapy. The consent form was witnessed and verified by the simulation staff.  Then, the patient was set-up in a stable reproducible  supine position for radiation therapy.  CT images were obtained.  Surface markings were placed.  The CT images were loaded into the planning software.  Then the target and avoidance structures were contoured.  Treatment planning then occurred.  The radiation prescription was entered and confirmed.  Then, I designed and supervised the construction of a total of 6 medically necessary complex treatment devices, including a BodyFix immobilization mold custom fitted to the patient along with 5 multileaf collimators conformally shaped radiation around the treatment target while shielding critical structures such as the heart and spinal cord maximally.  I have requested : 3D Simulation  I have requested a DVH of the following structures: Left lung, right lung, spinal cord, heart, esophagus, and target.  I have ordered:Nutrition Consult  SPECIAL TREATMENT PROCEDURE:  The planned course of therapy using radiation constitutes a special treatment procedure. Special care is required in the management of this patient for the following reasons.  The patient will be receiving concurrent  chemotherapy requiring careful monitoring for increased toxicities of treatment including periodic laboratory values.  The special nature of the planned course of radiotherapy will require increased physician supervision and oversight to ensure patient's safety with optimal treatment outcomes.  PLAN:  The patient will receive 66 Gy in 33 fractions.  ________________________________  Sheral Apley Tammi Klippel, M.D.

## 2016-08-31 ENCOUNTER — Encounter: Payer: Self-pay | Admitting: *Deleted

## 2016-09-01 DIAGNOSIS — Z510 Encounter for antineoplastic radiation therapy: Secondary | ICD-10-CM | POA: Diagnosis not present

## 2016-09-04 ENCOUNTER — Ambulatory Visit: Admission: RE | Admit: 2016-09-04 | Payer: 59 | Source: Ambulatory Visit | Admitting: Radiation Oncology

## 2016-09-04 ENCOUNTER — Encounter: Payer: Self-pay | Admitting: Radiation Oncology

## 2016-09-04 ENCOUNTER — Other Ambulatory Visit: Payer: Managed Care, Other (non HMO)

## 2016-09-05 ENCOUNTER — Other Ambulatory Visit: Payer: Self-pay | Admitting: General Surgery

## 2016-09-05 ENCOUNTER — Other Ambulatory Visit: Payer: Self-pay | Admitting: Student

## 2016-09-05 ENCOUNTER — Ambulatory Visit (HOSPITAL_BASED_OUTPATIENT_CLINIC_OR_DEPARTMENT_OTHER): Payer: Managed Care, Other (non HMO)

## 2016-09-05 ENCOUNTER — Ambulatory Visit
Admission: RE | Admit: 2016-09-05 | Discharge: 2016-09-05 | Disposition: A | Discharge status: Home / Self Care | Payer: 59 | Source: Ambulatory Visit | Attending: Radiation Oncology | Admitting: Radiation Oncology

## 2016-09-05 ENCOUNTER — Encounter: Payer: Self-pay | Admitting: *Deleted

## 2016-09-05 VITALS — BP 145/81 | HR 62 | Temp 98.3°F | Resp 18

## 2016-09-05 DIAGNOSIS — Z5111 Encounter for antineoplastic chemotherapy: Secondary | ICD-10-CM | POA: Diagnosis not present

## 2016-09-05 DIAGNOSIS — C3411 Malignant neoplasm of upper lobe, right bronchus or lung: Secondary | ICD-10-CM

## 2016-09-05 DIAGNOSIS — Z510 Encounter for antineoplastic radiation therapy: Secondary | ICD-10-CM | POA: Diagnosis not present

## 2016-09-05 DIAGNOSIS — C3491 Malignant neoplasm of unspecified part of right bronchus or lung: Secondary | ICD-10-CM

## 2016-09-05 LAB — CBC WITH DIFFERENTIAL/PLATELET
BASO%: 1 % (ref 0.0–2.0)
Basophils Absolute: 0 10*3/uL (ref 0.0–0.1)
EOS%: 2.8 % (ref 0.0–7.0)
Eosinophils Absolute: 0.1 10*3/uL (ref 0.0–0.5)
HEMATOCRIT: 35.7 % — AB (ref 38.4–49.9)
HGB: 11.8 g/dL — ABNORMAL LOW (ref 13.0–17.1)
LYMPH#: 0.6 10*3/uL — AB (ref 0.9–3.3)
LYMPH%: 13.2 % — ABNORMAL LOW (ref 14.0–49.0)
MCH: 29.5 pg (ref 27.2–33.4)
MCHC: 33 g/dL (ref 32.0–36.0)
MCV: 89.3 fL (ref 79.3–98.0)
MONO#: 0.4 10*3/uL (ref 0.1–0.9)
MONO%: 9.6 % (ref 0.0–14.0)
NEUT#: 3.2 10*3/uL (ref 1.5–6.5)
NEUT%: 73.4 % (ref 39.0–75.0)
Platelets: 162 10*3/uL (ref 140–400)
RBC: 4 10*6/uL — AB (ref 4.20–5.82)
RDW: 14.2 % (ref 11.0–14.6)
WBC: 4.3 10*3/uL (ref 4.0–10.3)

## 2016-09-05 LAB — COMPREHENSIVE METABOLIC PANEL
ALT: 17 U/L (ref 0–55)
AST: 21 U/L (ref 5–34)
Albumin: 3.4 g/dL — ABNORMAL LOW (ref 3.5–5.0)
Alkaline Phosphatase: 154 U/L — ABNORMAL HIGH (ref 40–150)
Anion Gap: 9 mEq/L (ref 3–11)
BUN: 19 mg/dL (ref 7.0–26.0)
CALCIUM: 9.3 mg/dL (ref 8.4–10.4)
CHLORIDE: 106 meq/L (ref 98–109)
CO2: 31 meq/L — AB (ref 22–29)
CREATININE: 1.6 mg/dL — AB (ref 0.7–1.3)
EGFR: 52 mL/min/{1.73_m2} — ABNORMAL LOW (ref >90)
GLUCOSE: 96 mg/dL (ref 70–140)
Potassium: 3.3 mEq/L — ABNORMAL LOW (ref 3.5–5.1)
SODIUM: 145 meq/L (ref 136–145)
Total Bilirubin: 0.33 mg/dL (ref 0.20–1.20)
Total Protein: 6.8 g/dL (ref 6.4–8.3)

## 2016-09-05 MED ORDER — SODIUM CHLORIDE 0.9 % IV SOLN
20.0000 mg | Freq: Once | INTRAVENOUS | Status: AC
  Administered 2016-09-05: 20 mg via INTRAVENOUS
  Filled 2016-09-05: qty 2

## 2016-09-05 MED ORDER — PALONOSETRON HCL INJECTION 0.25 MG/5ML
0.2500 mg | Freq: Once | INTRAVENOUS | Status: AC
  Administered 2016-09-05: 0.25 mg via INTRAVENOUS

## 2016-09-05 MED ORDER — DIPHENHYDRAMINE HCL 50 MG/ML IJ SOLN
INTRAMUSCULAR | Status: AC
  Filled 2016-09-05: qty 1

## 2016-09-05 MED ORDER — FAMOTIDINE IN NACL 20-0.9 MG/50ML-% IV SOLN
20.0000 mg | Freq: Once | INTRAVENOUS | Status: AC
  Administered 2016-09-05: 20 mg via INTRAVENOUS

## 2016-09-05 MED ORDER — PACLITAXEL CHEMO INJECTION 300 MG/50ML
45.0000 mg/m2 | Freq: Once | INTRAVENOUS | Status: AC
  Administered 2016-09-05: 120 mg via INTRAVENOUS
  Filled 2016-09-05: qty 20

## 2016-09-05 MED ORDER — DIPHENHYDRAMINE HCL 50 MG/ML IJ SOLN
50.0000 mg | Freq: Once | INTRAMUSCULAR | Status: AC
  Administered 2016-09-05: 50 mg via INTRAVENOUS

## 2016-09-05 MED ORDER — SODIUM CHLORIDE 0.9 % IV SOLN
222.6000 mg | Freq: Once | INTRAVENOUS | Status: AC
  Administered 2016-09-05: 220 mg via INTRAVENOUS
  Filled 2016-09-05: qty 22

## 2016-09-05 MED ORDER — SODIUM CHLORIDE 0.9 % IV SOLN
Freq: Once | INTRAVENOUS | Status: AC
  Administered 2016-09-05: 09:00:00 via INTRAVENOUS

## 2016-09-05 MED ORDER — DEXAMETHASONE SODIUM PHOSPHATE 10 MG/ML IJ SOLN
INTRAMUSCULAR | Status: AC
  Filled 2016-09-05: qty 1

## 2016-09-05 MED ORDER — PALONOSETRON HCL INJECTION 0.25 MG/5ML
INTRAVENOUS | Status: AC
  Filled 2016-09-05: qty 5

## 2016-09-05 MED ORDER — FAMOTIDINE IN NACL 20-0.9 MG/50ML-% IV SOLN
INTRAVENOUS | Status: AC
  Filled 2016-09-05: qty 50

## 2016-09-05 NOTE — Patient Instructions (Addendum)
[facility] Discharge Instructions for Patients Receiving Chemotherapy  Today you received the following chemotherapy agents Taxol and Carboplatin   To help prevent nausea and vomiting after your treatment, we encourage you to take your nausea medication as directed. No Zofran for 3 days. Take Compazine instead.    If you develop nausea and vomiting that is not controlled by your nausea medication, call the clinic.   BELOW ARE SYMPTOMS THAT SHOULD BE REPORTED IMMEDIATELY:  *FEVER GREATER THAN 100.5 F  *CHILLS WITH OR WITHOUT FEVER  NAUSEA AND VOMITING THAT IS NOT CONTROLLED WITH YOUR NAUSEA MEDICATION  *UNUSUAL SHORTNESS OF BREATH  *UNUSUAL BRUISING OR BLEEDING  TENDERNESS IN MOUTH AND THROAT WITH OR WITHOUT PRESENCE OF ULCERS  *URINARY PROBLEMS  *BOWEL PROBLEMS  UNUSUAL RASH Items with * indicate a potential emergency and should be followed up as soon as possible.  Feel free to call the clinic you have any questions or concerns. The clinic phone number is [phone].  Please show the [facility] at check-in to the Emergency Department and triage nurse.    Carboplatin injection What is this medicine? CARBOPLATIN (KAR boe pla tin) is a chemotherapy drug. It targets fast dividing cells, like cancer cells, and causes these cells to die. This medicine is used to treat ovarian cancer and many other cancers. This medicine may be used for other purposes; ask your health care provider or pharmacist if you have questions. COMMON BRAND NAME(S): Paraplatin What should I tell my health care provider before I take this medicine? They need to know if you have any of these conditions: -blood disorders -hearing problems -kidney disease -recent or ongoing radiation therapy -an unusual or allergic reaction to carboplatin, cisplatin, other chemotherapy, other medicines, foods, dyes, or preservatives -pregnant or trying to get pregnant -breast-feeding How should I  use this medicine? This drug is usually given as an infusion into a vein. It is administered in a hospital or clinic by a specially trained health care professional. Talk to your pediatrician regarding the use of this medicine in children. Special care may be needed. Overdosage: If you think you have taken too much of this medicine contact a poison control center or emergency room at once. NOTE: This medicine is only for you. Do not share this medicine with others. What if I miss a dose? It is important not to miss a dose. Call your doctor or health care professional if you are unable to keep an appointment. What may interact with this medicine? -medicines for seizures -medicines to increase blood counts like filgrastim, pegfilgrastim, sargramostim -some antibiotics like amikacin, gentamicin, neomycin, streptomycin, tobramycin -vaccines Talk to your doctor or health care professional before taking any of these medicines: -acetaminophen -aspirin -ibuprofen -ketoprofen -naproxen This list may not describe all possible interactions. Give your health care provider a list of all the medicines, herbs, non-prescription drugs, or dietary supplements you use. Also tell them if you smoke, drink alcohol, or use illegal drugs. Some items may interact with your medicine. What should I watch for while using this medicine? Your condition will be monitored carefully while you are receiving this medicine. You will need important blood work done while you are taking this medicine. This drug may make you feel generally unwell. This is not uncommon, as chemotherapy can affect healthy cells as well as cancer cells. Report any side effects. Continue your course of treatment even though you feel ill unless your doctor tells you to stop. In some cases,  you may be given additional medicines to help with side effects. Follow all directions for their use. Call your doctor or health care professional for advice if you  get a fever, chills or sore throat, or other symptoms of a cold or flu. Do not treat yourself. This drug decreases your body's ability to fight infections. Try to avoid being around people who are sick. This medicine may increase your risk to bruise or bleed. Call your doctor or health care professional if you notice any unusual bleeding. Be careful brushing and flossing your teeth or using a toothpick because you may get an infection or bleed more easily. If you have any dental work done, tell your dentist you are receiving this medicine. Avoid taking products that contain aspirin, acetaminophen, ibuprofen, naproxen, or ketoprofen unless instructed by your doctor. These medicines may hide a fever. Do not become pregnant while taking this medicine. Women should inform their doctor if they wish to become pregnant or think they might be pregnant. There is a potential for serious side effects to an unborn child. Talk to your health care professional or pharmacist for more information. Do not breast-feed an infant while taking this medicine. What side effects may I notice from receiving this medicine? Side effects that you should report to your doctor or health care professional as soon as possible: -allergic reactions like skin rash, itching or hives, swelling of the face, lips, or tongue -signs of infection - fever or chills, cough, sore throat, pain or difficulty passing urine -signs of decreased platelets or bleeding - bruising, pinpoint red spots on the skin, black, tarry stools, nosebleeds -signs of decreased red blood cells - unusually weak or tired, fainting spells, lightheadedness -breathing problems -changes in hearing -changes in vision -chest pain -high blood pressure -low blood counts - This drug may decrease the number of white blood cells, red blood cells and platelets. You may be at increased risk for infections and bleeding. -nausea and vomiting -pain, swelling, redness or irritation  at the injection site -pain, tingling, numbness in the hands or feet -problems with balance, talking, walking -trouble passing urine or change in the amount of urine Side effects that usually do not require medical attention (report to your doctor or health care professional if they continue or are bothersome): -hair loss -loss of appetite -metallic taste in the mouth or changes in taste This list may not describe all possible side effects. Call your doctor for medical advice about side effects. You may report side effects to FDA at 1-800-FDA-1088. Where should I keep my medicine? This drug is given in a hospital or clinic and will not be stored at home. NOTE: This sheet is a summary. It may not cover all possible information. If you have questions about this medicine, talk to your doctor, pharmacist, or health care provider.  2018 Elsevier/Gold Standard (2007-06-25 14:38:05)   Paclitaxel injection What is this medicine? PACLITAXEL (PAK li TAX el) is a chemotherapy drug. It targets fast dividing cells, like cancer cells, and causes these cells to die. This medicine is used to treat ovarian cancer, breast cancer, and other cancers. This medicine may be used for other purposes; ask your health care provider or pharmacist if you have questions. COMMON BRAND NAME(S): Onxol, Taxol What should I tell my health care provider before I take this medicine? They need to know if you have any of these conditions: -blood disorders -irregular heartbeat -infection (especially a virus infection such as chickenpox, cold sores, or herpes) -  liver disease -previous or ongoing radiation therapy -an unusual or allergic reaction to paclitaxel, alcohol, polyoxyethylated castor oil, other chemotherapy agents, other medicines, foods, dyes, or preservatives -pregnant or trying to get pregnant -breast-feeding How should I use this medicine? This drug is given as an infusion into a vein. It is administered in a  hospital or clinic by a specially trained health care professional. Talk to your pediatrician regarding the use of this medicine in children. Special care may be needed. Overdosage: If you think you have taken too much of this medicine contact a poison control center or emergency room at once. NOTE: This medicine is only for you. Do not share this medicine with others. What if I miss a dose? It is important not to miss your dose. Call your doctor or health care professional if you are unable to keep an appointment. What may interact with this medicine? Do not take this medicine with any of the following medications: -disulfiram -metronidazole This medicine may also interact with the following medications: -cyclosporine -diazepam -ketoconazole -medicines to increase blood counts like filgrastim, pegfilgrastim, sargramostim -other chemotherapy drugs like cisplatin, doxorubicin, epirubicin, etoposide, teniposide, vincristine -quinidine -testosterone -vaccines -verapamil Talk to your doctor or health care professional before taking any of these medicines: -acetaminophen -aspirin -ibuprofen -ketoprofen -naproxen This list may not describe all possible interactions. Give your health care provider a list of all the medicines, herbs, non-prescription drugs, or dietary supplements you use. Also tell them if you smoke, drink alcohol, or use illegal drugs. Some items may interact with your medicine. What should I watch for while using this medicine? Your condition will be monitored carefully while you are receiving this medicine. You will need important blood work done while you are taking this medicine. This medicine can cause serious allergic reactions. To reduce your risk you will need to take other medicine(s) before treatment with this medicine. If you experience allergic reactions like skin rash, itching or hives, swelling of the face, lips, or tongue, tell your doctor or health care  professional right away. In some cases, you may be given additional medicines to help with side effects. Follow all directions for their use. This drug may make you feel generally unwell. This is not uncommon, as chemotherapy can affect healthy cells as well as cancer cells. Report any side effects. Continue your course of treatment even though you feel ill unless your doctor tells you to stop. Call your doctor or health care professional for advice if you get a fever, chills or sore throat, or other symptoms of a cold or flu. Do not treat yourself. This drug decreases your body's ability to fight infections. Try to avoid being around people who are sick. This medicine may increase your risk to bruise or bleed. Call your doctor or health care professional if you notice any unusual bleeding. Be careful brushing and flossing your teeth or using a toothpick because you may get an infection or bleed more easily. If you have any dental work done, tell your dentist you are receiving this medicine. Avoid taking products that contain aspirin, acetaminophen, ibuprofen, naproxen, or ketoprofen unless instructed by your doctor. These medicines may hide a fever. Do not become pregnant while taking this medicine. Women should inform their doctor if they wish to become pregnant or think they might be pregnant. There is a potential for serious side effects to an unborn child. Talk to your health care professional or pharmacist for more information. Do not breast-feed an infant while taking  this medicine. Men are advised not to father a child while receiving this medicine. This product may contain alcohol. Ask your pharmacist or healthcare provider if this medicine contains alcohol. Be sure to tell all healthcare providers you are taking this medicine. Certain medicines, like metronidazole and disulfiram, can cause an unpleasant reaction when taken with alcohol. The reaction includes flushing, headache, nausea, vomiting,  sweating, and increased thirst. The reaction can last from 30 minutes to several hours. What side effects may I notice from receiving this medicine? Side effects that you should report to your doctor or health care professional as soon as possible: -allergic reactions like skin rash, itching or hives, swelling of the face, lips, or tongue -low blood counts - This drug may decrease the number of white blood cells, red blood cells and platelets. You may be at increased risk for infections and bleeding. -signs of infection - fever or chills, cough, sore throat, pain or difficulty passing urine -signs of decreased platelets or bleeding - bruising, pinpoint red spots on the skin, black, tarry stools, nosebleeds -signs of decreased red blood cells - unusually weak or tired, fainting spells, lightheadedness -breathing problems -chest pain -high or low blood pressure -mouth sores -nausea and vomiting -pain, swelling, redness or irritation at the injection site -pain, tingling, numbness in the hands or feet -slow or irregular heartbeat -swelling of the ankle, feet, hands Side effects that usually do not require medical attention (report to your doctor or health care professional if they continue or are bothersome): -bone pain -complete hair loss including hair on your head, underarms, pubic hair, eyebrows, and eyelashes -changes in the color of fingernails -diarrhea -loosening of the fingernails -loss of appetite -muscle or joint pain -red flush to skin -sweating This list may not describe all possible side effects. Call your doctor for medical advice about side effects. You may report side effects to FDA at 1-800-FDA-1088. Where should I keep my medicine? This drug is given in a hospital or clinic and will not be stored at home. NOTE: This sheet is a summary. It may not cover all possible information. If you have questions about this medicine, talk to your doctor, pharmacist, or health care  provider.  2018 Elsevier/Gold Standard (2015-01-19 19:58:00)

## 2016-09-05 NOTE — Progress Notes (Signed)
Per Dr Julien Nordmann , it is okay to treat pt today with chemo and todays cmet results.

## 2016-09-06 ENCOUNTER — Ambulatory Visit (HOSPITAL_COMMUNITY)
Admission: RE | Admit: 2016-09-06 | Discharge: 2016-09-06 | Disposition: A | Discharge status: Home / Self Care | Payer: Managed Care, Other (non HMO) | Source: Ambulatory Visit | Attending: Radiology | Admitting: Radiology

## 2016-09-06 ENCOUNTER — Telehealth: Payer: Self-pay | Admitting: Medical Oncology

## 2016-09-06 ENCOUNTER — Ambulatory Visit
Admission: RE | Admit: 2016-09-06 | Discharge: 2016-09-06 | Disposition: A | Discharge status: Home / Self Care | Payer: 59 | Source: Ambulatory Visit | Attending: Radiation Oncology | Admitting: Radiation Oncology

## 2016-09-06 ENCOUNTER — Encounter: Payer: Self-pay | Admitting: *Deleted

## 2016-09-06 ENCOUNTER — Ambulatory Visit (HOSPITAL_COMMUNITY)
Admission: RE | Admit: 2016-09-06 | Discharge: 2016-09-06 | Disposition: A | Discharge status: Home / Self Care | Payer: Managed Care, Other (non HMO) | Source: Ambulatory Visit | Attending: Internal Medicine | Admitting: Internal Medicine

## 2016-09-06 DIAGNOSIS — E785 Hyperlipidemia, unspecified: Secondary | ICD-10-CM | POA: Diagnosis not present

## 2016-09-06 DIAGNOSIS — C3411 Malignant neoplasm of upper lobe, right bronchus or lung: Secondary | ICD-10-CM | POA: Insufficient documentation

## 2016-09-06 DIAGNOSIS — R911 Solitary pulmonary nodule: Secondary | ICD-10-CM

## 2016-09-06 DIAGNOSIS — Z510 Encounter for antineoplastic radiation therapy: Secondary | ICD-10-CM | POA: Diagnosis not present

## 2016-09-06 DIAGNOSIS — G4733 Obstructive sleep apnea (adult) (pediatric): Secondary | ICD-10-CM | POA: Diagnosis not present

## 2016-09-06 DIAGNOSIS — Z79899 Other long term (current) drug therapy: Secondary | ICD-10-CM | POA: Insufficient documentation

## 2016-09-06 DIAGNOSIS — I110 Hypertensive heart disease with heart failure: Secondary | ICD-10-CM | POA: Insufficient documentation

## 2016-09-06 DIAGNOSIS — Z7901 Long term (current) use of anticoagulants: Secondary | ICD-10-CM | POA: Diagnosis not present

## 2016-09-06 DIAGNOSIS — I5022 Chronic systolic (congestive) heart failure: Secondary | ICD-10-CM | POA: Diagnosis not present

## 2016-09-06 DIAGNOSIS — I428 Other cardiomyopathies: Secondary | ICD-10-CM | POA: Insufficient documentation

## 2016-09-06 DIAGNOSIS — M109 Gout, unspecified: Secondary | ICD-10-CM | POA: Insufficient documentation

## 2016-09-06 DIAGNOSIS — E039 Hypothyroidism, unspecified: Secondary | ICD-10-CM | POA: Diagnosis not present

## 2016-09-06 DIAGNOSIS — J9383 Other pneumothorax: Secondary | ICD-10-CM | POA: Insufficient documentation

## 2016-09-06 DIAGNOSIS — Z87891 Personal history of nicotine dependence: Secondary | ICD-10-CM | POA: Insufficient documentation

## 2016-09-06 DIAGNOSIS — J449 Chronic obstructive pulmonary disease, unspecified: Secondary | ICD-10-CM | POA: Insufficient documentation

## 2016-09-06 DIAGNOSIS — Z95810 Presence of automatic (implantable) cardiac defibrillator: Secondary | ICD-10-CM | POA: Insufficient documentation

## 2016-09-06 DIAGNOSIS — Z7189 Other specified counseling: Secondary | ICD-10-CM

## 2016-09-06 DIAGNOSIS — C3491 Malignant neoplasm of unspecified part of right bronchus or lung: Secondary | ICD-10-CM

## 2016-09-06 DIAGNOSIS — Z5111 Encounter for antineoplastic chemotherapy: Secondary | ICD-10-CM

## 2016-09-06 LAB — APTT: aPTT: 20 seconds — ABNORMAL LOW (ref 24–36)

## 2016-09-06 LAB — PROTIME-INR
INR: 0.98
Prothrombin Time: 13 seconds (ref 11.4–15.2)

## 2016-09-06 MED ORDER — LIDOCAINE HCL 1 % IJ SOLN
INTRAMUSCULAR | Status: AC
  Filled 2016-09-06: qty 20

## 2016-09-06 MED ORDER — MIDAZOLAM HCL 2 MG/2ML IJ SOLN
INTRAMUSCULAR | Status: AC
  Filled 2016-09-06: qty 2

## 2016-09-06 MED ORDER — FENTANYL CITRATE (PF) 100 MCG/2ML IJ SOLN
INTRAMUSCULAR | Status: AC
  Filled 2016-09-06: qty 2

## 2016-09-06 MED ORDER — FENTANYL CITRATE (PF) 100 MCG/2ML IJ SOLN
INTRAMUSCULAR | Status: AC | PRN
  Administered 2016-09-06: 25 ug via INTRAVENOUS
  Administered 2016-09-06: 50 ug via INTRAVENOUS

## 2016-09-06 MED ORDER — SODIUM CHLORIDE 0.9 % IV SOLN
INTRAVENOUS | Status: AC | PRN
  Administered 2016-09-06: 10 mL/h via INTRAVENOUS

## 2016-09-06 MED ORDER — MIDAZOLAM HCL 2 MG/2ML IJ SOLN
INTRAMUSCULAR | Status: AC | PRN
  Administered 2016-09-06: 1 mg via INTRAVENOUS
  Administered 2016-09-06: 0.5 mg via INTRAVENOUS

## 2016-09-06 MED ORDER — SODIUM CHLORIDE 0.9 % IV SOLN: INTRAVENOUS | Status: DC

## 2016-09-06 NOTE — Sedation Documentation (Signed)
Patient is resting comfortably. 

## 2016-09-06 NOTE — Sedation Documentation (Signed)
Dr Annamaria Boots in to talk to patient and see re chest pain right post lung biopsy.

## 2016-09-06 NOTE — Procedures (Signed)
RUL NODULE  S/P CT BX  EBL 5cc  Small stable apical ptx  Path pending  Full report in PACS

## 2016-09-06 NOTE — Discharge Instructions (Signed)
Needle Biopsy of the Lung, Care After °This sheet gives you information about how to care for yourself after your procedure. Your health care provider may also give you more specific instructions. If you have problems or questions, contact your health care provider. °What can I expect after the procedure? °After the procedure, it is common to have: °· Soreness, pain, and tenderness where a tissue sample was taken (biopsy site). °· A cough. °· A sore throat. ° °Follow these instructions at home: °Biopsy site care °· Follow instructions from your health care provider about when to remove the bandage that was placed on the biopsy site. °· Keep the bandage dry until it has been removed. °· Check your biopsy site every day for signs of infection. Check for: °? More redness, swelling, or pain. °? More fluid or blood. °? Warmth to the touch. °? Pus or a bad smell. °General instructions °· Rest as directed by your health care provider. Ask your health care provider what activities are safe for you. °· Do not take baths, swim, or use a hot tub until your health care provider approves. °· Take over-the-counter and prescription medicines only as told by your health care provider. °· If you have airplane travel scheduled, talk with your health care provider about when it is safe for you to travel by airplane. °· It is up to you to get the results of your procedure. Ask your health care provider, or the department that is doing the procedure, when your results will be ready. °· Keep all follow-up visits as told by your health care provider. This is important. °Contact a health care provider if: °· You have more redness, swelling, or pain around your biopsy site. °· You have more fluid or blood coming from your biopsy site. °· Your biopsy site feels warm to the touch. °· You have pus or a bad smell coming from your biopsy site. °· You have a fever. °· You have pain that does not get better with medicine. °Get help right away  if: °· You have problems breathing. °· You have chest pain. °· You cough up blood. °· You faint. °· You have a fast heart rate. °Summary °· After a needle biopsy of the lung, it is common to have a cough, a sore throat, or soreness, pain, and tenderness where a tissue sample was taken (biopsy site). °· You should check your biopsy area every day for signs of infection, including pus or a bad smell, warmth, more fluid or blood, or more redness, swelling, or pain. °· You should not take baths, swim, or use a hot tub until your health care provider approves. °· It is up to you to get the results of your procedure. Ask your health care provider, or the department that is doing the procedure, when your results will be ready. °This information is not intended to replace advice given to you by your health care provider. Make sure you discuss any questions you have with your health care provider. °Document Released: 01/15/2007 Document Revised: 02/09/2016 Document Reviewed: 02/09/2016 °Elsevier Interactive Patient Education © 2017 [employer] ° ° °

## 2016-09-06 NOTE — H&P (Signed)
Chief Complaint: Patient was seen in consultation today for lung nodule biopsy at the request of [facility]  Referring Physician(s): Mohamed,Mohamed  Supervising Physician: Daryll Brod  Patient Status: [facility] - Out-pt  History of Present Illness: Jason Russell is a 66 y.o. male recently diagnosed with lung cancer after bronchoscopy. He has begun radiation therapy but a perc biopsy of the RUL nodule is requested to send for molecular studies. He is scheduled today for biopsy. PMHx, meds, imaging, labs, allergies reviewed. He has not taken his Xarelto for the past 3 days. Has been NPO this am. Family is at bedside  Past Medical History:  Diagnosis Date  . Adenocarcinoma of right lung, stage 3 ([place]) 08/23/2016  . Adenocarcinoma of right lung, stage 3 ([place]) 08/23/2016  . Anemia   . Arthritis    "hx right hip"  . Asthma    "when I was a child"  . Atrial fibrillation (HCC)    Amiodarone started 10/2011; Coumadin  . 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 St. Jude ICD implantation 10/03/12.  . Chronic anticoagulation   . Chronic systolic heart failure ([place])    a. Echo 7/13: EF 25%;  b. echo 04/2012:  Mild LVH, EF 30-35%, Gr 1 DD, mild AI, mild MR, mild LAE  . COPD (chronic obstructive pulmonary disease) ([place])   . Dyslipidemia   . Dysrhythmia   . Encounter for antineoplastic chemotherapy 08/24/2016  . Gout   . History of blood transfusion 10/15/2013   "don't know where the blood's going; HgB down to 5"  . Hyperlipidemia   . Hypertension   . Hypothyroidism   . NICM (nonischemic cardiomyopathy) ([place])    [place] 4/14:  minimal CAD  . Obesity   . OSA on CPAP   . Tobacco abuse     Past Surgical History:  Procedure Laterality Date  . CARDIAC DEFIBRILLATOR PLACEMENT  2014  . CARDIOVERSION  2011  . COLONOSCOPY WITH PROPOFOL Left 10/17/2013   Procedure: COLONOSCOPY WITH PROPOFOL;  Surgeon: Inda Castle, MD;  Location: [place];  Service:  Endoscopy;  Laterality: Left;  . ESOPHAGOGASTRODUODENOSCOPY N/A 10/17/2013   Procedure: ESOPHAGOGASTRODUODENOSCOPY (EGD);  Surgeon: Inda Castle, MD;  Location: [place];  Service: Endoscopy;  Laterality: N/A;  . GIVENS CAPSULE STUDY N/A 10/29/2013   Procedure: GIVENS CAPSULE STUDY;  Surgeon: Inda Castle, MD;  Location: [facility];  Service: Endoscopy;  Laterality: N/A;  . IMPLANTABLE CARDIOVERTER DEFIBRILLATOR IMPLANT Left 10/03/2012   Procedure: IMPLANTABLE CARDIOVERTER DEFIBRILLATOR IMPLANT;  Surgeon: Deboraha Sprang, MD;  Location: [facility] CATH LAB;  Service: Cardiovascular;  Laterality: Left;  . JOINT REPLACEMENT    . TONSILLECTOMY  1950's  . TOTAL HIP ARTHROPLASTY Right 11/25/1997  . VIDEO BRONCHOSCOPY WITH ENDOBRONCHIAL ULTRASOUND N/A 08/09/2016   Procedure: VIDEO BRONCHOSCOPY WITH ENDOBRONCHIAL ULTRASOUND;  Surgeon: Javier Glazier, MD;  Location: [facility] OR;  Service: Thoracic;  Laterality: N/A;    Allergies: No known allergies  Medications: Prior to Admission medications   Medication Sig Start Date End Date Taking? Authorizing Provider  acetaminophen (TYLENOL) 500 MG tablet Take 1,000 mg by mouth every 6 (six) hours as needed for mild pain.   Yes [provider]  albuterol (PROVENTIL HFA;VENTOLIN HFA) 108 (90 Base) MCG/ACT inhaler Inhale 2 puffs into the lungs every 4 (four) hours as needed for wheezing or shortness of breath. 08/18/16  Yes [place], [place], MD  amiodarone (PACERONE) 100 MG tablet Take 1 tablet (100 mg total) by  mouth daily. 02/03/16  Yes Fay Records, MD  atorvastatin (LIPITOR) 20 MG tablet TAKE 1 TABLET BY MOUTH EVERY DAY 05/25/16  Yes Fay Records, MD  buPROPion [facility] SR) 150 MG 12 hr tablet TAKE 1 TABLET (150 MG TOTAL) BY MOUTH 2 (TWO) TIMES DAILY. Patient taking differently: Take 150 mg by mouth 2 (two) times daily.  10/08/15  Yes Fay Records, MD  carvedilol (COREG) 12.5 MG tablet TAKE 1 TABLET (12.5 MG TOTAL) BY MOUTH 2 (TWO) TIMES DAILY.  07/20/16  Yes Deboraha Sprang, MD  colchicine 0.6 MG tablet Take 1 tablet by mouth as needed (gout flares).  12/10/14  Yes [provider]  [employer] D3 2000 units CAPS Take 1 capsule by mouth daily. 08/06/16  Yes [provider]  ferrous gluconate (FERGON) 324 MG tablet Take 1 tablet (324 mg total) by mouth 3 (three) times daily with meals. 05/12/16  Yes Deboraha Sprang, MD  furosemide (LASIX) 20 MG tablet Take 3 tabs (60 mg) every Tue and Sat, all other days take 2 tabs (40 mg) Patient taking differently: Take 40-60 mg by mouth See admin instructions. Take 3 tabs (60 mg) every Tue and Sat, all other days take 2 tabs (40 mg) 07/25/16  Yes Fay Records, MD  hydrALAZINE (APRESOLINE) 100 MG tablet Take 1 tablet by mouth 3 (three) times daily. 11/19/14  Yes [provider]  levothyroxine (SYNTHROID, LEVOTHROID) 50 MCG tablet TAKE 1 TABLET (50 MCG TOTAL) BY MOUTH DAILY BEFORE BREAKFAST. 10/21/15  Yes Deboraha Sprang, MD  lisinopril (PRINIVIL,ZESTRIL) 40 MG tablet TAKE 1 TABLET BY MOUTH DAILY. 03/13/16  Yes Fay Records, MD  Polyvinyl Alcohol-Povidone (REFRESH OP) Apply 1 drop to eye daily.   Yes [provider]  potassium chloride SA (KLOR-CON M20) 20 MEQ tablet Take 1 tablet every day.  On days when lasix is taken, take 2 tablets Patient taking differently: Take 20 mEq by mouth See admin instructions. Take 1 tablet every day.  On days when lasix is taken, take 2 tablets 04/13/16  Yes Fay Records, MD  prochlorperazine (COMPAZINE) 10 MG tablet Take 1 tablet (10 mg total) by mouth every 6 (six) hours as needed for nausea or vomiting. 08/24/16  Yes Curt Bears, MD  rivaroxaban (XARELTO) 20 MG TABS tablet Take 1 tablet (20 mg total) by mouth daily. 08/11/16  Yes Javier Glazier, MD  sildenafil (REVATIO) 20 MG tablet Take 1 tablet (20 mg total) by mouth at bedtime. Take 2-3 tablets daily prn for ED 08/05/15  Yes Fay Records, MD  spironolactone (ALDACTONE) 25 MG tablet TAKE 0.5  TABLETS (12.5 MG TOTAL) BY MOUTH DAILY. 03/13/16  Yes Fay Records, MD  ULORIC 40 MG tablet Take 1 tablet by mouth daily. 12/11/14  Yes [provider]  Vitamin D, Ergocalciferol, (DRISDOL) 50000 units CAPS capsule Take 1 capsule by mouth once a week. Monday 06/10/15  Yes [provider]     Family History  Problem Relation Age of Onset  . Hypertension Mother   . Other Father        deceased- unknow reason  . Lung cancer Brother     Social History   Social History  . Marital status: Married    Spouse name: N/A  . Number of children: 1  . Years of education: N/A   Occupational History  . [employer] - Freight forwarder [employer]   Social History Main Topics  . Smoking status:  Former Smoker    Packs/day: 0.25    Years: 45.00    Types: Cigarettes    Quit date: 07/11/2016  . Smokeless tobacco: Never Used  . Alcohol use 0.6 oz/week    1 Cans of beer per week  . Drug use: No  . Sexual activity: Not Currently   Other Topics Concern  . Not on file   Social History Narrative  . No narrative on file     Review of Systems: A 12 point ROS discussed and pertinent positives are indicated in the HPI above.  All other systems are negative.  Review of Systems  Vital Signs: BP 138/70 (BP Location: Right Arm)   Pulse 75   Temp 98 F (36.7 C) (Oral)   Ht 6\' 1"  (1.854 m)   Wt 279 lb (126.6 kg)   SpO2 93%   BMI 36.81 kg/m   Physical Exam  Constitutional: He is oriented to person, place, and time. He appears well-developed and well-nourished. No distress.  HENT:  Head: Normocephalic.  Mouth/Throat: Oropharynx is clear and moist.  Neck: Normal range of motion. No JVD present. No tracheal deviation present.  Cardiovascular: Normal rate, regular rhythm and normal heart sounds.   Pulmonary/Chest: Effort normal and breath sounds normal. No respiratory distress.  Abdominal: Soft. He exhibits no distension. There is no tenderness.  Neurological: He is alert  and oriented to person, place, and time.  Skin: Skin is warm and dry.  Psychiatric: He has a normal mood and affect. Judgment normal.    Mallampati Score:  MD Evaluation Airway: WNL Heart: WNL Abdomen: WNL Chest/ Lungs: WNL ASA  Classification: 3 Mallampati/Airway Score: One  Imaging: Dg Chest Portable 1 View  Result Date: 08/09/2016 CLINICAL DATA:  Shortness of breath. EXAM: PORTABLE CHEST 1 VIEW COMPARISON:  None. FINDINGS: 1224 hours. Low volume film. The cardio pericardial silhouette is enlarged. There is pulmonary vascular congestion without overt pulmonary edema. No evidence for pneumothorax or substantial pleural effusion. Left-sided AICD again noted. IMPRESSION: 1. No evidence for pneumothorax. 2. Cardiomegaly with vascular congestion. 3. Known hypermetabolic posterior right upper lobe lesion not well seen by x-ray. Electronically Signed   By: Misty Stanley M.D.   On: 08/09/2016 12:38    Labs:  CBC:  Recent Labs  07/21/16 1429 08/04/16 1302 08/24/16 1438 09/05/16 0943  WBC 5.5 6.0 6.9 4.3  HGB 12.1* 11.1* 12.6* 11.8*  HCT 37.1* 34.7* 39.7 35.7*  PLT 182 212 185 162    COAGS:  Recent Labs  07/21/16 1429 08/09/16 0918  INR 1.0 1.01  APTT 25  --     BMP:  Recent Labs  05/11/16 1445 06/26/16 1129 07/21/16 1426 08/04/16 1302 08/24/16 1438 09/05/16 0944  NA 145* 143 146* 140 143 145  K 4.0 3.8 3.4* 3.3* 3.4* 3.3*  CL 105 103 102 105  --   --   CO2 23 27 28 27  30* 31*  GLUCOSE 89 99 80 107* 95 96  BUN 25 22 28* 19 22.1 19.0  CALCIUM 9.3 9.2 9.6 9.0 9.7 9.3  CREATININE 1.67* 1.50* 1.76* 1.43* 1.6* 1.6*  GFRNONAA 43* 49* 40* 50*  --   --   GFRAA 49* 56* 46* 58*  --   --     LIVER FUNCTION TESTS:  Recent Labs  05/11/16 1445 08/24/16 1438 09/05/16 0944  BILITOT 0.4 0.44 0.33  AST 14 17 21   ALT 8 14 17   ALKPHOS 118* 158* 154*  PROT 6.6 7.2 6.8  ALBUMIN  4.2 3.6 3.4*    TUMOR MARKERS: No results for input(s): AFPTM, CEA, CA199, CHROMGRNA  in the last 8760 hours.  Assessment and Plan: Lung cancer For CT guided bx of RUL nodule for molecular studies Labs reviewed, await coags. Xarelto help appropriately. Risks and Benefits discussed with the patient including, but not limited to bleeding, hemoptysis, respiratory failure requiring intubation, infection, pneumothorax requiring chest tube placement, stroke from air embolism or even death. All of the patient's questions were answered, patient is agreeable to proceed. Consent signed and in chart.    Thank you for this interesting consult.  I greatly enjoyed meeting Jason Russell and look forward to participating in their care.  A copy of this report was sent to the requesting provider on this date.  Electronically Signed: Ascencion Dike, PA-C 09/06/2016, 11:41 AM   I spent a total of 25 minutes in face to face in clinical consultation, greater than 50% of which was counseling/coordinating care for lung biopsy

## 2016-09-06 NOTE — Progress Notes (Signed)
Oncology Nurse Navigator Documentation  Oncology Nurse Navigator Flowsheets 09/06/2016  Navigator Location [facility]  Navigator Encounter Type Other/I followed up on ct head appt.  It has not been scheduled. Scan was authorized on 5/30.  I contacted central scheduling for an appt  Barriers/Navigation Needs Coordination of Care  Interventions Coordination of Care  Coordination of Care Appts;Radiology  Acuity Level 2  Time Spent with Patient 30

## 2016-09-06 NOTE — Telephone Encounter (Signed)
I left a message for pt to return call to triage nurse and tell us how he is doing after chemo.

## 2016-09-06 NOTE — Sedation Documentation (Signed)
Patient denies pain and is resting comfortably.  

## 2016-09-06 NOTE — Sedation Documentation (Addendum)
Placed back in bed supine (was kept prone on table for 10 minutes at the request of Dr Annamaria Boots) and started to complain of 5/10 sharp pain and increases with deep breaths. Dr Annamaria Boots aware. SP02 99 on room air, BP156 88, heart rate 77, Resps 20.

## 2016-09-07 ENCOUNTER — Ambulatory Visit
Admission: RE | Admit: 2016-09-07 | Discharge: 2016-09-07 | Disposition: A | Discharge status: Home / Self Care | Payer: 59 | Source: Ambulatory Visit | Attending: Radiation Oncology | Admitting: Radiation Oncology

## 2016-09-07 DIAGNOSIS — Z510 Encounter for antineoplastic radiation therapy: Secondary | ICD-10-CM | POA: Diagnosis not present

## 2016-09-08 ENCOUNTER — Encounter (HOSPITAL_COMMUNITY): Payer: Self-pay

## 2016-09-08 ENCOUNTER — Telehealth: Payer: Self-pay

## 2016-09-08 ENCOUNTER — Ambulatory Visit
Admission: RE | Admit: 2016-09-08 | Discharge: 2016-09-08 | Disposition: A | Discharge status: Home / Self Care | Payer: 59 | Source: Ambulatory Visit | Attending: Radiation Oncology | Admitting: Radiation Oncology

## 2016-09-08 ENCOUNTER — Ambulatory Visit (INDEPENDENT_AMBULATORY_CARE_PROVIDER_SITE_OTHER): Payer: Managed Care, Other (non HMO)

## 2016-09-08 ENCOUNTER — Ambulatory Visit (HOSPITAL_COMMUNITY)
Admission: RE | Admit: 2016-09-08 | Discharge: 2016-09-08 | Disposition: A | Discharge status: Home / Self Care | Payer: Managed Care, Other (non HMO) | Source: Ambulatory Visit | Attending: Internal Medicine | Admitting: Internal Medicine

## 2016-09-08 DIAGNOSIS — Z95810 Presence of automatic (implantable) cardiac defibrillator: Secondary | ICD-10-CM

## 2016-09-08 DIAGNOSIS — I5022 Chronic systolic (congestive) heart failure: Secondary | ICD-10-CM

## 2016-09-08 DIAGNOSIS — Z510 Encounter for antineoplastic radiation therapy: Secondary | ICD-10-CM | POA: Diagnosis not present

## 2016-09-08 DIAGNOSIS — Z9889 Other specified postprocedural states: Secondary | ICD-10-CM | POA: Insufficient documentation

## 2016-09-08 DIAGNOSIS — Z7189 Other specified counseling: Secondary | ICD-10-CM | POA: Insufficient documentation

## 2016-09-08 DIAGNOSIS — J329 Chronic sinusitis, unspecified: Secondary | ICD-10-CM | POA: Diagnosis not present

## 2016-09-08 DIAGNOSIS — C3491 Malignant neoplasm of unspecified part of right bronchus or lung: Secondary | ICD-10-CM | POA: Diagnosis present

## 2016-09-08 DIAGNOSIS — Z5111 Encounter for antineoplastic chemotherapy: Secondary | ICD-10-CM

## 2016-09-08 MED ORDER — IOPAMIDOL (ISOVUE-300) INJECTION 61%
INTRAVENOUS | Status: AC
  Filled 2016-09-08: qty 75

## 2016-09-08 MED ORDER — IOPAMIDOL (ISOVUE-300) INJECTION 61%
75.0000 mL | Freq: Once | INTRAVENOUS | Status: AC | PRN
  Administered 2016-09-08: 75 mL via INTRAVENOUS

## 2016-09-08 NOTE — Progress Notes (Signed)
EPIC Encounter for ICM Monitoring  Patient Name: Jason Russell is a 66 y.o. male Date: 09/08/2016 Primary Care Physican: Lucianne Lei, MD Primary Cardiologist:Ross Electrophysiologist: Faustino Congress Weight:unknown       Attempted call to patient and unable to reach.  Left detailed message regarding transmission.  Transmission reviewed.   Patient currently undergoing chemo for Stage III Lung Cancer.   Thoracic impedance slightly decreased but trending back to baseline.  Prescribed dosage: Furosemide 20 mg 3 tablets (60 mg total) every Tuesday and Saturday and 40 mg all the other days. Potassium 20 mEq 1 tablet every day. On days when lasix is taken, take 2 tablets  Labs: 09/05/2016 Creatinine 1.6,   BUN 19, Potassium 3.3, Sodium 145 08/24/2016 Creatinine 1.6,   BUN 22, Potassium 3.4, Sodium 143 08/04/2016 Creatinine 1.43, BUN 19, Potassium 3.3, Sodium 140, EGFR 50-58 07/21/2016 Creatinine 1.76, BUN 28, Potassium 3.4, Sodium 146 06/26/2016 Creatinine 1.50, BUN 22, Potassium 3.8, Sodium 143, EGFR 49-56 05/11/2016 Creatinine 1.67, BUN 25, Potassium 4.0, Sodium 145, EGFR 43-49 04/28/2016 Creatinine 1.37, BUN 22, Potassium 3.8, Sodium 143, EGFR 54-63 04/10/2016 Creatinine 1.37, BUN 23, Potassium 3.8, Sodium 143, EGFR 54-63 02/15/2016 Creatinine 1.76, BUN 25, Potassium 3.7, Sodium 142 08/16/2015 Creatinine 1.63, BUN 26, Potassium 3.5, Sodium 144  07/19/2015 Creatinine 1.56, BUN 24, Potassium 3.4, Sodium 141  12/15/2015 Creatinine 1.34, BUN 28, Potassium 3.8, Sodium 141   Recommendations: Left voice mail with ICM number and encouraged to call for fluid symptoms.  Follow-up plan: ICM clinic phone appointment on 11/20/2016 since he has an office appointment scheduled on 10/17/2016 with Dr. Caryl Comes.  Copy of ICM check sent to primary cardiologist and device physician.   3 month ICM trend: 09/08/2016   1 Year ICM trend:      [name], RN 09/08/2016 9:00 AM

## 2016-09-08 NOTE — Telephone Encounter (Signed)
Remote ICM transmission received.  Attempted patient call and left detailed message regarding transmission and next ICM scheduled for 11/20/2016 and office appointment with Dr Caryl Comes 10/17/2016.  Advised to return call for any fluid symptoms or questions.

## 2016-09-11 ENCOUNTER — Other Ambulatory Visit (HOSPITAL_BASED_OUTPATIENT_CLINIC_OR_DEPARTMENT_OTHER): Payer: Managed Care, Other (non HMO)

## 2016-09-11 ENCOUNTER — Encounter: Payer: Self-pay | Admitting: *Deleted

## 2016-09-11 ENCOUNTER — Other Ambulatory Visit: Payer: Self-pay | Admitting: *Deleted

## 2016-09-11 ENCOUNTER — Ambulatory Visit (HOSPITAL_BASED_OUTPATIENT_CLINIC_OR_DEPARTMENT_OTHER): Payer: Managed Care, Other (non HMO)

## 2016-09-11 ENCOUNTER — Ambulatory Visit (HOSPITAL_BASED_OUTPATIENT_CLINIC_OR_DEPARTMENT_OTHER): Payer: Managed Care, Other (non HMO) | Admitting: Nurse Practitioner

## 2016-09-11 ENCOUNTER — Ambulatory Visit
Admission: RE | Admit: 2016-09-11 | Discharge: 2016-09-11 | Disposition: A | Discharge status: Home / Self Care | Payer: 59 | Source: Ambulatory Visit | Attending: Radiation Oncology | Admitting: Radiation Oncology

## 2016-09-11 VITALS — BP 152/89 | HR 87 | Temp 98.1°F | Resp 18 | Ht 73.0 in | Wt 281.8 lb

## 2016-09-11 DIAGNOSIS — C3411 Malignant neoplasm of upper lobe, right bronchus or lung: Secondary | ICD-10-CM

## 2016-09-11 DIAGNOSIS — Z510 Encounter for antineoplastic radiation therapy: Secondary | ICD-10-CM | POA: Diagnosis not present

## 2016-09-11 DIAGNOSIS — Z5111 Encounter for antineoplastic chemotherapy: Secondary | ICD-10-CM

## 2016-09-11 LAB — CBC WITH DIFFERENTIAL/PLATELET
BASO%: 0.9 % (ref 0.0–2.0)
BASOS ABS: 0 10*3/uL (ref 0.0–0.1)
EOS%: 1.9 % (ref 0.0–7.0)
Eosinophils Absolute: 0.1 10*3/uL (ref 0.0–0.5)
HCT: 38.5 % (ref 38.4–49.9)
HGB: 12.5 g/dL — ABNORMAL LOW (ref 13.0–17.1)
LYMPH%: 10.6 % — AB (ref 14.0–49.0)
MCH: 29 pg (ref 27.2–33.4)
MCHC: 32.4 g/dL (ref 32.0–36.0)
MCV: 89.5 fL (ref 79.3–98.0)
MONO#: 0.4 10*3/uL (ref 0.1–0.9)
MONO%: 8.5 % (ref 0.0–14.0)
NEUT#: 4 10*3/uL (ref 1.5–6.5)
NEUT%: 78.1 % — AB (ref 39.0–75.0)
Platelets: 189 10*3/uL (ref 140–400)
RBC: 4.31 10*6/uL (ref 4.20–5.82)
RDW: 14 % (ref 11.0–14.6)
WBC: 5.2 10*3/uL (ref 4.0–10.3)
lymph#: 0.5 10*3/uL — ABNORMAL LOW (ref 0.9–3.3)

## 2016-09-11 LAB — COMPREHENSIVE METABOLIC PANEL
ALT: 16 U/L (ref 0–55)
AST: 19 U/L (ref 5–34)
Albumin: 3.6 g/dL (ref 3.5–5.0)
Alkaline Phosphatase: 138 U/L (ref 40–150)
Anion Gap: 12 mEq/L — ABNORMAL HIGH (ref 3–11)
BUN: 32.9 mg/dL — AB (ref 7.0–26.0)
CALCIUM: 9.9 mg/dL (ref 8.4–10.4)
CHLORIDE: 104 meq/L (ref 98–109)
CO2: 26 meq/L (ref 22–29)
Creatinine: 1.6 mg/dL — ABNORMAL HIGH (ref 0.7–1.3)
EGFR: 51 mL/min/{1.73_m2} — ABNORMAL LOW (ref >90)
GLUCOSE: 94 mg/dL (ref 70–140)
POTASSIUM: 3.7 meq/L (ref 3.5–5.1)
SODIUM: 142 meq/L (ref 136–145)
Total Bilirubin: 0.51 mg/dL (ref 0.20–1.20)
Total Protein: 7.3 g/dL (ref 6.4–8.3)

## 2016-09-11 MED ORDER — SODIUM CHLORIDE 0.9 % IV SOLN
20.0000 mg | Freq: Once | INTRAVENOUS | Status: AC
  Administered 2016-09-11: 20 mg via INTRAVENOUS
  Filled 2016-09-11: qty 2

## 2016-09-11 MED ORDER — DIPHENHYDRAMINE HCL 50 MG/ML IJ SOLN
INTRAMUSCULAR | Status: AC
  Filled 2016-09-11: qty 1

## 2016-09-11 MED ORDER — PALONOSETRON HCL INJECTION 0.25 MG/5ML
0.2500 mg | Freq: Once | INTRAVENOUS | Status: AC
  Administered 2016-09-11: 0.25 mg via INTRAVENOUS

## 2016-09-11 MED ORDER — FAMOTIDINE IN NACL 20-0.9 MG/50ML-% IV SOLN
INTRAVENOUS | Status: AC
  Filled 2016-09-11: qty 50

## 2016-09-11 MED ORDER — RADIAPLEXRX EX GEL
Freq: Once | CUTANEOUS | Status: AC
  Administered 2016-09-11: 11:00:00 via TOPICAL

## 2016-09-11 MED ORDER — DIPHENHYDRAMINE HCL 50 MG/ML IJ SOLN
50.0000 mg | Freq: Once | INTRAMUSCULAR | Status: AC
  Administered 2016-09-11: 50 mg via INTRAVENOUS

## 2016-09-11 MED ORDER — SODIUM CHLORIDE 0.9 % IV SOLN
222.6000 mg | Freq: Once | INTRAVENOUS | Status: AC
  Administered 2016-09-11: 220 mg via INTRAVENOUS
  Filled 2016-09-11: qty 22

## 2016-09-11 MED ORDER — PALONOSETRON HCL INJECTION 0.25 MG/5ML
INTRAVENOUS | Status: AC
Start: 2016-09-11 — End: 2016-09-11
  Filled 2016-09-11: qty 5

## 2016-09-11 MED ORDER — FAMOTIDINE IN NACL 20-0.9 MG/50ML-% IV SOLN
20.0000 mg | Freq: Once | INTRAVENOUS | Status: AC
  Administered 2016-09-11: 20 mg via INTRAVENOUS

## 2016-09-11 MED ORDER — PACLITAXEL CHEMO INJECTION 300 MG/50ML
45.0000 mg/m2 | Freq: Once | INTRAVENOUS | Status: AC
  Administered 2016-09-11: 120 mg via INTRAVENOUS
  Filled 2016-09-11: qty 20

## 2016-09-11 MED ORDER — SODIUM CHLORIDE 0.9 % IV SOLN
Freq: Once | INTRAVENOUS | Status: AC
  Administered 2016-09-11: 16:00:00 via INTRAVENOUS

## 2016-09-11 MED ORDER — RADIAPLEXRX EX GEL: Freq: Once | CUTANEOUS | Status: DC

## 2016-09-11 NOTE — Patient Instructions (Signed)
[facility] Discharge Instructions for Patients Receiving Chemotherapy  Today you received the following chemotherapy agents taxol/carboplatin  To help prevent nausea and vomiting after your treatment, we encourage you to take your nausea medication as directed   If you develop nausea and vomiting that is not controlled by your nausea medication, call the clinic.   BELOW ARE SYMPTOMS THAT SHOULD BE REPORTED IMMEDIATELY:  *FEVER GREATER THAN 100.5 F  *CHILLS WITH OR WITHOUT FEVER  NAUSEA AND VOMITING THAT IS NOT CONTROLLED WITH YOUR NAUSEA MEDICATION  *UNUSUAL SHORTNESS OF BREATH  *UNUSUAL BRUISING OR BLEEDING  TENDERNESS IN MOUTH AND THROAT WITH OR WITHOUT PRESENCE OF ULCERS  *URINARY PROBLEMS  *BOWEL PROBLEMS  UNUSUAL RASH Items with * indicate a potential emergency and should be followed up as soon as possible.  Feel free to call the clinic you have any questions or concerns. The clinic phone number is [phone].  

## 2016-09-11 NOTE — Progress Notes (Signed)
Ok to treat with Scr 1.6 per [name], NP

## 2016-09-11 NOTE — Progress Notes (Signed)
  [facility] OFFICE PROGRESS NOTE   Diagnosis:  Non-small cell lung cancer  INTERVAL HISTORY:   [name] returns as scheduled. He began radiation 09/05/2016. He completed week 1 carboplatin/Taxol 09/05/2016. He denies nausea/vomiting. No mouth sores. No diarrhea. No numbness or tingling in his hands or feet. No rash. He has stable dyspnea on exertion. No cough. No fever.  Objective:  Vital signs in last 24 hours:  Blood pressure (!) 152/89, pulse 87, temperature 98.1 F (36.7 C), temperature source Oral, resp. rate 18, height 6\' 1"  (1.854 m), weight 281 lb 12.8 oz (127.8 kg), SpO2 98 %.    HEENT: No thrush or ulcers. Resp: Lungs clear bilaterally. Cardio: Regular rate and rhythm. GI: Abdomen soft and nontender. No hepatomegaly. Vascular: No leg edema.   Lab Results:  Lab Results  Component Value Date   WBC 5.2 09/11/2016   HGB 12.5 (L) 09/11/2016   HCT 38.5 09/11/2016   MCV 89.5 09/11/2016   PLT 189 09/11/2016   NEUTROABS 4.0 09/11/2016    Imaging:  No results found.  Medications: I have reviewed the patient's current medications.  Assessment/Plan: 1. Stage IIIa (T1a, N2, M0) non-small cell lung cancer, adenocarcinoma presented with right upper lobe lung nodule in addition to mediastinal lymphadenopathy diagnosed in March 2018. Status post biopsy right upper lobe nodule 09/06/2016 for molecular studies. Brain CT 09/08/2016 negative for metastatic disease. Initiation of radiation and weekly carboplatin/Taxol 09/05/2016.   Disposition: [name] appears stable. Plan to continue radiation. He has completed 1 weekly treatment with carboplatin/Taxol. Plan to proceed with week 2 today as scheduled.  He will return for a follow-up visit in 2 weeks. He will contact the office in the interim with any problems.  Plan reviewed with Dr. Julien Nordmann.    [name] ANP/GNP-BC   09/11/2016  1:55 PM

## 2016-09-11 NOTE — Progress Notes (Signed)
Oncology Nurse Navigator Documentation  Oncology Nurse Navigator Flowsheets 09/11/2016  Navigator Location [facility]  Navigator Encounter Type Other/per Dr. Julien Nordmann, I requested from [facility] de[facility] to send  recent tissue for foundation one and PDL 1   Barriers/Navigation Needs Coordination of Care  Interventions Coordination of Care  Coordination of Care Other  Acuity Level 2  Time Spent with Patient 15

## 2016-09-11 NOTE — Progress Notes (Signed)
Order placed for [facility]

## 2016-09-12 ENCOUNTER — Ambulatory Visit
Admission: RE | Admit: 2016-09-12 | Discharge: 2016-09-12 | Disposition: A | Discharge status: Home / Self Care | Payer: 59 | Source: Ambulatory Visit | Attending: Radiation Oncology | Admitting: Radiation Oncology

## 2016-09-12 ENCOUNTER — Other Ambulatory Visit: Payer: Self-pay | Admitting: Medical Oncology

## 2016-09-12 DIAGNOSIS — I878 Other specified disorders of veins: Secondary | ICD-10-CM

## 2016-09-12 DIAGNOSIS — Z510 Encounter for antineoplastic radiation therapy: Secondary | ICD-10-CM | POA: Diagnosis not present

## 2016-09-13 ENCOUNTER — Ambulatory Visit
Admission: RE | Admit: 2016-09-13 | Discharge: 2016-09-13 | Disposition: A | Discharge status: Home / Self Care | Payer: 59 | Source: Ambulatory Visit | Attending: Radiation Oncology | Admitting: Radiation Oncology

## 2016-09-13 DIAGNOSIS — Z510 Encounter for antineoplastic radiation therapy: Secondary | ICD-10-CM | POA: Diagnosis not present

## 2016-09-14 ENCOUNTER — Ambulatory Visit
Admission: RE | Admit: 2016-09-14 | Discharge: 2016-09-14 | Disposition: A | Discharge status: Home / Self Care | Payer: 59 | Source: Ambulatory Visit | Attending: Radiation Oncology | Admitting: Radiation Oncology

## 2016-09-14 DIAGNOSIS — C3411 Malignant neoplasm of upper lobe, right bronchus or lung: Secondary | ICD-10-CM

## 2016-09-14 DIAGNOSIS — Z510 Encounter for antineoplastic radiation therapy: Secondary | ICD-10-CM | POA: Diagnosis not present

## 2016-09-15 ENCOUNTER — Ambulatory Visit
Admission: RE | Admit: 2016-09-15 | Discharge: 2016-09-15 | Disposition: A | Discharge status: Home / Self Care | Payer: 59 | Source: Ambulatory Visit | Attending: Radiation Oncology | Admitting: Radiation Oncology

## 2016-09-15 DIAGNOSIS — Z510 Encounter for antineoplastic radiation therapy: Secondary | ICD-10-CM | POA: Diagnosis not present

## 2016-09-18 ENCOUNTER — Other Ambulatory Visit (HOSPITAL_BASED_OUTPATIENT_CLINIC_OR_DEPARTMENT_OTHER): Payer: Managed Care, Other (non HMO)

## 2016-09-18 ENCOUNTER — Ambulatory Visit: Payer: Managed Care, Other (non HMO)

## 2016-09-18 ENCOUNTER — Telehealth: Payer: Self-pay | Admitting: Internal Medicine

## 2016-09-18 ENCOUNTER — Ambulatory Visit
Admission: RE | Admit: 2016-09-18 | Discharge: 2016-09-18 | Disposition: A | Discharge status: Home / Self Care | Payer: 59 | Source: Ambulatory Visit | Attending: Radiation Oncology | Admitting: Radiation Oncology

## 2016-09-18 DIAGNOSIS — C3491 Malignant neoplasm of unspecified part of right bronchus or lung: Secondary | ICD-10-CM

## 2016-09-18 DIAGNOSIS — Z510 Encounter for antineoplastic radiation therapy: Secondary | ICD-10-CM | POA: Diagnosis not present

## 2016-09-18 DIAGNOSIS — C3411 Malignant neoplasm of upper lobe, right bronchus or lung: Secondary | ICD-10-CM

## 2016-09-18 LAB — COMPREHENSIVE METABOLIC PANEL
ALBUMIN: 3.4 g/dL — AB (ref 3.5–5.0)
ALK PHOS: 124 U/L (ref 40–150)
ALT: 17 U/L (ref 0–55)
AST: 22 U/L (ref 5–34)
Anion Gap: 9 mEq/L (ref 3–11)
BUN: 25.8 mg/dL (ref 7.0–26.0)
CALCIUM: 9.6 mg/dL (ref 8.4–10.4)
CO2: 25 mEq/L (ref 22–29)
CREATININE: 1.9 mg/dL — AB (ref 0.7–1.3)
Chloride: 110 mEq/L — ABNORMAL HIGH (ref 98–109)
EGFR: 43 mL/min/{1.73_m2} — ABNORMAL LOW (ref >90)
GLUCOSE: 95 mg/dL (ref 70–140)
Potassium: 3.2 mEq/L — ABNORMAL LOW (ref 3.5–5.1)
SODIUM: 144 meq/L (ref 136–145)
TOTAL PROTEIN: 6.8 g/dL (ref 6.4–8.3)
Total Bilirubin: 0.38 mg/dL (ref 0.20–1.20)

## 2016-09-18 LAB — CBC WITH DIFFERENTIAL/PLATELET
BASO%: 0.3 % (ref 0.0–2.0)
Basophils Absolute: 0 10*3/uL (ref 0.0–0.1)
EOS ABS: 0.1 10*3/uL (ref 0.0–0.5)
EOS%: 2.5 % (ref 0.0–7.0)
HEMATOCRIT: 34.8 % — AB (ref 38.4–49.9)
HEMOGLOBIN: 11.3 g/dL — AB (ref 13.0–17.1)
LYMPH%: 10.9 % — ABNORMAL LOW (ref 14.0–49.0)
MCH: 29.1 pg (ref 27.2–33.4)
MCHC: 32.5 g/dL (ref 32.0–36.0)
MCV: 89.7 fL (ref 79.3–98.0)
MONO#: 0.4 10*3/uL (ref 0.1–0.9)
MONO%: 11.7 % (ref 0.0–14.0)
NEUT%: 74.6 % (ref 39.0–75.0)
NEUTROS ABS: 2.7 10*3/uL (ref 1.5–6.5)
NRBC: 0 % (ref 0–0)
Platelets: 152 10*3/uL (ref 140–400)
RBC: 3.88 10*6/uL — ABNORMAL LOW (ref 4.20–5.82)
RDW: 14.5 % (ref 11.0–14.6)
WBC: 3.6 10*3/uL — ABNORMAL LOW (ref 4.0–10.3)
lymph#: 0.4 10*3/uL — ABNORMAL LOW (ref 0.9–3.3)

## 2016-09-18 NOTE — Progress Notes (Signed)
Multiple IV attempts by multiple RN's made without success. Patient scheduled for [facility] placement on Thursday, June 21st. Will reschedule infusion for Friday, 6/22. Dr. Julien Nordmann to be notified by Abelina Bachelor, RN.

## 2016-09-18 NOTE — Telephone Encounter (Signed)
sw pt to confirm r/s infusion appt to 6/22 at noon per sch msg

## 2016-09-19 ENCOUNTER — Other Ambulatory Visit: Payer: Self-pay | Admitting: Radiology

## 2016-09-19 ENCOUNTER — Ambulatory Visit
Admission: RE | Admit: 2016-09-19 | Discharge: 2016-09-19 | Disposition: A | Discharge status: Home / Self Care | Payer: 59 | Source: Ambulatory Visit | Attending: Radiation Oncology | Admitting: Radiation Oncology

## 2016-09-19 DIAGNOSIS — Z510 Encounter for antineoplastic radiation therapy: Secondary | ICD-10-CM | POA: Diagnosis not present

## 2016-09-20 ENCOUNTER — Ambulatory Visit
Admission: RE | Admit: 2016-09-20 | Discharge: 2016-09-20 | Disposition: A | Discharge status: Home / Self Care | Payer: 59 | Source: Ambulatory Visit | Attending: Radiation Oncology | Admitting: Radiation Oncology

## 2016-09-20 ENCOUNTER — Other Ambulatory Visit: Payer: Self-pay | Admitting: Radiology

## 2016-09-20 ENCOUNTER — Telehealth: Payer: Self-pay | Admitting: *Deleted

## 2016-09-20 DIAGNOSIS — Z510 Encounter for antineoplastic radiation therapy: Secondary | ICD-10-CM | POA: Diagnosis not present

## 2016-09-20 NOTE — Telephone Encounter (Addendum)
-----   Message ----- From: Curt Bears, MD Sent: 09/18/2016   6:30 PM To: Ardeen Garland, RN  K is low. Please encourage K rich diet ----- Message ----- From: Interface, Lab In Three Zero One Sent: 09/18/2016  11:20 AM To: Curt Bears, MD

## 2016-09-20 NOTE — Telephone Encounter (Signed)
Notified pt to increase K+ in diet

## 2016-09-21 ENCOUNTER — Ambulatory Visit
Admission: RE | Admit: 2016-09-21 | Discharge: 2016-09-21 | Disposition: A | Discharge status: Home / Self Care | Payer: 59 | Source: Ambulatory Visit | Attending: Radiation Oncology | Admitting: Radiation Oncology

## 2016-09-21 ENCOUNTER — Other Ambulatory Visit: Payer: Self-pay | Admitting: Internal Medicine

## 2016-09-21 ENCOUNTER — Ambulatory Visit (HOSPITAL_COMMUNITY)
Admission: RE | Admit: 2016-09-21 | Discharge: 2016-09-21 | Disposition: A | Discharge status: Home / Self Care | Payer: Managed Care, Other (non HMO) | Source: Ambulatory Visit | Attending: Internal Medicine | Admitting: Internal Medicine

## 2016-09-21 ENCOUNTER — Encounter (HOSPITAL_COMMUNITY): Payer: Self-pay

## 2016-09-21 DIAGNOSIS — E669 Obesity, unspecified: Secondary | ICD-10-CM | POA: Insufficient documentation

## 2016-09-21 DIAGNOSIS — M109 Gout, unspecified: Secondary | ICD-10-CM | POA: Insufficient documentation

## 2016-09-21 DIAGNOSIS — Z7901 Long term (current) use of anticoagulants: Secondary | ICD-10-CM | POA: Diagnosis not present

## 2016-09-21 DIAGNOSIS — I428 Other cardiomyopathies: Secondary | ICD-10-CM | POA: Diagnosis not present

## 2016-09-21 DIAGNOSIS — I110 Hypertensive heart disease with heart failure: Secondary | ICD-10-CM | POA: Insufficient documentation

## 2016-09-21 DIAGNOSIS — D630 Anemia in neoplastic disease: Secondary | ICD-10-CM | POA: Diagnosis not present

## 2016-09-21 DIAGNOSIS — I4891 Unspecified atrial fibrillation: Secondary | ICD-10-CM | POA: Insufficient documentation

## 2016-09-21 DIAGNOSIS — I2510 Atherosclerotic heart disease of native coronary artery without angina pectoris: Secondary | ICD-10-CM | POA: Diagnosis not present

## 2016-09-21 DIAGNOSIS — Z95810 Presence of automatic (implantable) cardiac defibrillator: Secondary | ICD-10-CM | POA: Diagnosis not present

## 2016-09-21 DIAGNOSIS — C3491 Malignant neoplasm of unspecified part of right bronchus or lung: Secondary | ICD-10-CM | POA: Diagnosis present

## 2016-09-21 DIAGNOSIS — I5022 Chronic systolic (congestive) heart failure: Secondary | ICD-10-CM | POA: Insufficient documentation

## 2016-09-21 DIAGNOSIS — I878 Other specified disorders of veins: Secondary | ICD-10-CM

## 2016-09-21 DIAGNOSIS — E785 Hyperlipidemia, unspecified: Secondary | ICD-10-CM | POA: Diagnosis not present

## 2016-09-21 DIAGNOSIS — J449 Chronic obstructive pulmonary disease, unspecified: Secondary | ICD-10-CM | POA: Diagnosis not present

## 2016-09-21 DIAGNOSIS — G4733 Obstructive sleep apnea (adult) (pediatric): Secondary | ICD-10-CM | POA: Insufficient documentation

## 2016-09-21 DIAGNOSIS — E039 Hypothyroidism, unspecified: Secondary | ICD-10-CM | POA: Diagnosis not present

## 2016-09-21 DIAGNOSIS — Z510 Encounter for antineoplastic radiation therapy: Secondary | ICD-10-CM | POA: Diagnosis not present

## 2016-09-21 HISTORY — PX: IR FLUORO GUIDE PORT INSERTION RIGHT: IMG5741

## 2016-09-21 HISTORY — PX: IR US GUIDE VASC ACCESS RIGHT: IMG2390

## 2016-09-21 LAB — PROTIME-INR
INR: 0.98
Prothrombin Time: 13 seconds (ref 11.4–15.2)

## 2016-09-21 LAB — BASIC METABOLIC PANEL
Anion gap: 8 (ref 5–15)
BUN: 29 mg/dL — AB (ref 6–20)
CHLORIDE: 106 mmol/L (ref 101–111)
CO2: 29 mmol/L (ref 22–32)
Calcium: 9 mg/dL (ref 8.9–10.3)
Creatinine, Ser: 1.5 mg/dL — ABNORMAL HIGH (ref 0.61–1.24)
GFR calc Af Amer: 55 mL/min — ABNORMAL LOW (ref >60)
GFR, EST NON AFRICAN AMERICAN: 47 mL/min — AB (ref >60)
GLUCOSE: 95 mg/dL (ref 65–99)
POTASSIUM: 3.7 mmol/L (ref 3.5–5.1)
Sodium: 143 mmol/L (ref 135–145)

## 2016-09-21 LAB — CBC WITH DIFFERENTIAL/PLATELET
Basophils Absolute: 0 10*3/uL (ref 0.0–0.1)
Basophils Relative: 0 %
EOS PCT: 2 %
Eosinophils Absolute: 0.1 10*3/uL (ref 0.0–0.7)
HCT: 34.3 % — ABNORMAL LOW (ref 39.0–52.0)
Hemoglobin: 11.1 g/dL — ABNORMAL LOW (ref 13.0–17.0)
LYMPHS ABS: 0.2 10*3/uL — AB (ref 0.7–4.0)
LYMPHS PCT: 8 %
MCH: 28.7 pg (ref 26.0–34.0)
MCHC: 32.4 g/dL (ref 30.0–36.0)
MCV: 88.6 fL (ref 78.0–100.0)
MONO ABS: 0.5 10*3/uL (ref 0.1–1.0)
MONOS PCT: 19 %
Neutro Abs: 1.8 10*3/uL (ref 1.7–7.7)
Neutrophils Relative %: 71 %
Platelets: 162 10*3/uL (ref 150–400)
RBC: 3.87 MIL/uL — ABNORMAL LOW (ref 4.22–5.81)
RDW: 14.7 % (ref 11.5–15.5)
WBC: 2.6 10*3/uL — ABNORMAL LOW (ref 4.0–10.5)

## 2016-09-21 MED ORDER — MIDAZOLAM HCL 2 MG/2ML IJ SOLN
INTRAMUSCULAR | Status: AC
  Filled 2016-09-21: qty 8

## 2016-09-21 MED ORDER — FENTANYL CITRATE (PF) 100 MCG/2ML IJ SOLN
INTRAMUSCULAR | Status: AC | PRN
  Administered 2016-09-21 (×2): 50 ug via INTRAVENOUS

## 2016-09-21 MED ORDER — CEFAZOLIN SODIUM-DEXTROSE 2-4 GM/100ML-% IV SOLN
INTRAVENOUS | Status: AC
  Filled 2016-09-21: qty 100

## 2016-09-21 MED ORDER — MIDAZOLAM HCL 2 MG/2ML IJ SOLN
INTRAMUSCULAR | Status: AC | PRN
  Administered 2016-09-21 (×2): 1 mg via INTRAVENOUS

## 2016-09-21 MED ORDER — SODIUM CHLORIDE 0.9 % IV SOLN
INTRAVENOUS | Status: DC
  Administered 2016-09-21: 12:00:00 via INTRAVENOUS

## 2016-09-21 MED ORDER — CEFAZOLIN SODIUM-DEXTROSE 2-4 GM/100ML-% IV SOLN
2.0000 g | INTRAVENOUS | Status: AC
  Administered 2016-09-21: 2 g via INTRAVENOUS

## 2016-09-21 MED ORDER — LIDOCAINE-EPINEPHRINE (PF) 2 %-1:200000 IJ SOLN
INTRAMUSCULAR | Status: AC
  Administered 2016-09-21: 14:00:00 via INTRADERMAL
  Filled 2016-09-21: qty 20

## 2016-09-21 MED ORDER — FENTANYL CITRATE (PF) 100 MCG/2ML IJ SOLN
INTRAMUSCULAR | Status: AC
  Filled 2016-09-21: qty 8

## 2016-09-21 MED ORDER — HEPARIN SOD (PORK) LOCK FLUSH 100 UNIT/ML IV SOLN
INTRAVENOUS | Status: AC
  Filled 2016-09-21: qty 5

## 2016-09-21 NOTE — Procedures (Signed)
  R IJ Port cathter placement with [place] and fluoroscopy No complication No blood loss. See complete dictation in [facility].  Dillard Cannon MD Main # [phone] Pager  [phone]

## 2016-09-21 NOTE — H&P (Signed)
Referring Physician(s): Mohamed,Mohamed  Supervising Physician: Arne Cleveland  Patient Status:  [facility] OP  Chief Complaint:  "I'm here for a port a cath"  Subjective: Patient familiar to IR service from prior right lung mass biopsy on 09/06/16. 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 He has poor venous access and presents today for Port-A-Cath placement. He currently denies fever, headache, chest pain, cough, abdominal/back pain, nausea, vomiting or abnormal bleeding. He does have some dyspnea with exertion. Past Medical History:  Diagnosis Date  . Adenocarcinoma of right lung, stage 3 ([place]) 08/23/2016  . Adenocarcinoma of right lung, stage 3 ([place]) 08/23/2016  . Anemia   . Arthritis    "hx right hip"  . Asthma    "when I was a child"  . Atrial fibrillation (HCC)    Amiodarone started 10/2011; Coumadin  . 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 St. Jude ICD implantation 10/03/12.  . Chronic anticoagulation   . Chronic systolic heart failure ([place])    a. Echo 7/13: EF 25%;  b. echo 04/2012:  Mild LVH, EF 30-35%, Gr 1 DD, mild AI, mild MR, mild LAE  . COPD (chronic obstructive pulmonary disease) ([place])   . Dyslipidemia   . Dysrhythmia   . Encounter for antineoplastic chemotherapy 08/24/2016  . Gout   . History of blood transfusion 10/15/2013   "don't know where the blood's going; HgB down to 5"  . Hyperlipidemia   . Hypertension   . Hypothyroidism   . NICM (nonischemic cardiomyopathy) ([place])    [place] 4/14:  minimal CAD  . Obesity   . OSA on CPAP   . Tobacco abuse    Past Surgical History:  Procedure Laterality Date  . CARDIAC DEFIBRILLATOR PLACEMENT  2014  . CARDIOVERSION  2011  . COLONOSCOPY WITH PROPOFOL Left 10/17/2013   Procedure: COLONOSCOPY WITH PROPOFOL;  Surgeon: Inda Castle, MD;  Location: [place] [facility];  Service: Endoscopy;  Laterality: Left;  .  ESOPHAGOGASTRODUODENOSCOPY N/A 10/17/2013   Procedure: ESOPHAGOGASTRODUODENOSCOPY (EGD);  Surgeon: Inda Castle, MD;  Location: [place];  Service: Endoscopy;  Laterality: N/A;  . GIVENS CAPSULE STUDY N/A 10/29/2013   Procedure: GIVENS CAPSULE STUDY;  Surgeon: Inda Castle, MD;  Location: [facility];  Service: Endoscopy;  Laterality: N/A;  . IMPLANTABLE CARDIOVERTER DEFIBRILLATOR IMPLANT Left 10/03/2012   Procedure: IMPLANTABLE CARDIOVERTER DEFIBRILLATOR IMPLANT;  Surgeon: Deboraha Sprang, MD;  Location: [facility] CATH LAB;  Service: Cardiovascular;  Laterality: Left;  . JOINT REPLACEMENT    . TONSILLECTOMY  1950's  . TOTAL HIP ARTHROPLASTY Right 11/25/1997  . VIDEO BRONCHOSCOPY WITH ENDOBRONCHIAL ULTRASOUND N/A 08/09/2016   Procedure: VIDEO BRONCHOSCOPY WITH ENDOBRONCHIAL ULTRASOUND;  Surgeon: Javier Glazier, MD;  Location: [facility] OR;  Service: Thoracic;  Laterality: N/A;      Allergies: No known allergies  Medications: Prior to Admission medications   Medication Sig Start Date End Date Taking? Authorizing Provider  acetaminophen (TYLENOL) 500 MG tablet Take 1,000 mg by mouth every 6 (six) hours as needed for mild pain.   Yes [provider]  amiodarone (PACERONE) 100 MG tablet Take 1 tablet (100 mg total) by mouth daily. 02/03/16  Yes Fay Records, MD  atorvastatin (LIPITOR) 20 MG tablet TAKE 1 TABLET BY MOUTH EVERY DAY 05/25/16  Yes Fay Records, MD  buPROPion [facility] SR) 150 MG 12 hr tablet TAKE 1 TABLET (150 MG TOTAL) BY MOUTH 2 (TWO) TIMES DAILY.  Patient taking differently: Take 150 mg by mouth 2 (two) times daily.  10/08/15  Yes Fay Records, MD  carvedilol (COREG) 12.5 MG tablet TAKE 1 TABLET (12.5 MG TOTAL) BY MOUTH 2 (TWO) TIMES DAILY. 07/20/16  Yes Deboraha Sprang, MD  colchicine 0.6 MG tablet Take 1 tablet by mouth as needed (gout flares).  12/10/14  Yes [provider]  [employer] D3 2000 units CAPS Take 1 capsule by mouth daily. 08/06/16  Yes [provider]    ferrous gluconate (FERGON) 324 MG tablet Take 1 tablet (324 mg total) by mouth 3 (three) times daily with meals. 05/12/16  Yes Deboraha Sprang, MD  furosemide (LASIX) 20 MG tablet Take 3 tabs (60 mg) every Tue and Sat, all other days take 2 tabs (40 mg) Patient taking differently: Take 40-60 mg by mouth See admin instructions. Take 3 tabs (60 mg) every Tue and Sat, all other days take 2 tabs (40 mg) 07/25/16  Yes Fay Records, MD  hydrALAZINE (APRESOLINE) 100 MG tablet Take 1 tablet by mouth 3 (three) times daily. 11/19/14  Yes [provider]  levothyroxine (SYNTHROID, LEVOTHROID) 50 MCG tablet TAKE 1 TABLET (50 MCG TOTAL) BY MOUTH DAILY BEFORE BREAKFAST. 10/21/15  Yes Deboraha Sprang, MD  lisinopril (PRINIVIL,ZESTRIL) 40 MG tablet TAKE 1 TABLET BY MOUTH DAILY. 03/13/16  Yes Fay Records, MD  Polyvinyl Alcohol-Povidone (REFRESH OP) Apply 1 drop to eye daily.   Yes [provider]  potassium chloride SA (KLOR-CON M20) 20 MEQ tablet Take 1 tablet every day.  On days when lasix is taken, take 2 tablets Patient taking differently: Take 20 mEq by mouth See admin instructions. Take 1 tablet every day.  On days when lasix is taken, take 2 tablets 04/13/16  Yes Fay Records, MD  sildenafil (REVATIO) 20 MG tablet Take 1 tablet (20 mg total) by mouth at bedtime. Take 2-3 tablets daily prn for ED 08/05/15  Yes Fay Records, MD  spironolactone (ALDACTONE) 25 MG tablet TAKE 0.5 TABLETS (12.5 MG TOTAL) BY MOUTH DAILY. 03/13/16  Yes Fay Records, MD  ULORIC 40 MG tablet Take 1 tablet by mouth daily. 12/11/14  Yes [provider]  Vitamin D, Ergocalciferol, (DRISDOL) 50000 units CAPS capsule Take 1 capsule by mouth once a week. Monday 06/10/15  Yes [provider]  albuterol (PROVENTIL HFA;VENTOLIN HFA) 108 (90 Base) MCG/ACT inhaler Inhale 2 puffs into the lungs every 4 (four) hours as needed for wheezing or shortness of breath. 08/18/16   de Dios, [place] A, MD  prochlorperazine  (COMPAZINE) 10 MG tablet Take 1 tablet (10 mg total) by mouth every 6 (six) hours as needed for nausea or vomiting. Patient not taking: Reported on 09/11/2016 08/24/16   Curt Bears, MD  rivaroxaban (XARELTO) 20 MG TABS tablet Take 1 tablet (20 mg total) by mouth daily. 08/11/16   Javier Glazier, MD  Wound Cleansers (RADIAPLEX EX) Apply topically.    [provider]     Vital Signs: BP (!) 149/91   Pulse 67   Temp 97.9 F (36.6 C) (Oral)   Resp 16   SpO2 99%   Physical Exam awake, alert. Chest with distant breath sounds bilaterally. Left chest wall defibrillator noted ;Heart with regular rate and rhythm; abdomen soft, obese, positive bowel sounds, nontender. Mild bilateral lower extremity edema.  Imaging: No results found.  Labs:  CBC:  Recent Labs  09/05/16 0943 09/11/16 1227 09/18/16 1109 09/21/16 1209  WBC 4.3 5.2 3.6* 2.6*  HGB 11.8* 12.5* 11.3* 11.1*  HCT 35.7* 38.5 34.8* 34.3*  PLT 162 189 152 162    COAGS:  Recent Labs  07/21/16 1429 08/09/16 0918 09/06/16 1107 09/21/16 1209  INR 1.0 1.01 0.98 0.98  APTT 25  --  20*  --     BMP:  Recent Labs  06/26/16 1129 07/21/16 1426 08/04/16 1302  09/05/16 0944 09/11/16 1227 09/18/16 1109 09/21/16 1209  NA 143 146* 140  < > 145 142 144 143  K 3.8 3.4* 3.3*  < > 3.3* 3.7 3.2* 3.7  CL 103 102 105  --   --   --   --  106  CO2 27 28 27   < > 31* 26 25 29   GLUCOSE 99 80 107*  < > 96 94 95 95  BUN 22 28* 19  < > 19.0 32.9* 25.8 29*  CALCIUM 9.2 9.6 9.0  < > 9.3 9.9 9.6 9.0  CREATININE 1.50* 1.76* 1.43*  < > 1.6* 1.6* 1.9* 1.50*  GFRNONAA 49* 40* 50*  --   --   --   --  47*  GFRAA 56* 46* 58*  --   --   --   --  55*  < > = values in this interval not displayed.  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 He has poor venous access and presents today for Port-A-Cath placement. Risks and benefits discussed with the patient/daughter including, but not limited to bleeding, infection, pneumothorax, or fibrin sheath development and need for additional procedures.All of the patient's questions were answered, patient is agreeable to proceed.Consent signed and in chart.     Electronically Signed: D. Rowe Robert, PA-C 09/21/2016, 1:02 PM   I spent a total of 20 minutes at the the patient's bedside AND on the patient's hospital floor or unit, greater than 50% of which was counseling/coordinating care for Port-A-Cath placement

## 2016-09-21 NOTE — Discharge Instructions (Signed)
Moderate Conscious Sedation, Adult, Care After °These instructions provide you with information about caring for yourself after your procedure. Your health care provider may also give you more specific instructions. Your treatment has been planned according to current medical practices, but problems sometimes occur. Call your health care provider if you have any problems or questions after your procedure. °What can I expect after the procedure? °After your procedure, it is common: °· To feel sleepy for several hours. °· To feel clumsy and have poor balance for several hours. °· To have poor judgment for several hours. °· To vomit if you eat too soon. ° °Follow these instructions at home: °For at least 24 hours after the procedure: ° °· Do not: °? Participate in activities where you could fall or become injured. °? Drive. °? Use heavy machinery. °? Drink alcohol. °? Take sleeping pills or medicines that cause drowsiness. °? Make important decisions or sign legal documents. °? Take care of children on your own. °· Rest. °Eating and drinking °· Follow the diet recommended by your health care provider. °· If you vomit: °? Drink water, juice, or soup when you can drink without vomiting. °? Make sure you have little or no nausea before eating solid foods. °General instructions °· Have a responsible adult stay with you until you are awake and alert. °· Take over-the-counter and prescription medicines only as told by your health care provider. °· If you smoke, do not smoke without supervision. °· Keep all follow-up visits as told by your health care provider. This is important. °Contact a health care provider if: °· You keep feeling nauseous or you keep vomiting. °· You feel light-headed. °· You develop a rash. °· You have a fever. °Get help right away if: °· You have trouble breathing. °This information is not intended to replace advice given to you by your health care provider. Make sure you discuss any questions you have  with your health care provider. °Document Released: 01/08/2013 Document Revised: 08/23/2015 Document Reviewed: 07/10/2015 °Elsevier Interactive Patient Education © 2018 [employer] °Implanted Port Home Guide °An implanted port is a type of central line that is placed under the skin. Central lines are used to provide IV access when treatment or nutrition needs to be given through a person’s veins. Implanted ports are used for long-term IV access. An implanted port may be placed because: °· You need IV medicine that would be irritating to the small veins in your hands or arms. °· You need long-term IV medicines, such as antibiotics. °· You need IV nutrition for a long period. °· You need frequent blood draws for lab tests. °· You need dialysis. ° °Implanted ports are usually placed in the chest area, but they can also be placed in the upper arm, the abdomen, or the leg. An implanted port has two main parts: °· Reservoir. The reservoir is round and will appear as a small, raised area under your skin. The reservoir is the part where a needle is inserted to give medicines or draw blood. °· Catheter. The catheter is a thin, flexible tube that extends from the reservoir. The catheter is placed into a large vein. Medicine that is inserted into the reservoir goes into the catheter and then into the vein. ° °How will I care for my incision site? °Do not get the incision site wet. Bathe or shower as directed by your health care provider. °How is my port accessed? °Special steps must be taken to access the port: °·   Before the port is accessed, a numbing cream can be placed on the skin. This helps numb the skin over the port site.  Your health care provider uses a sterile technique to access the port. ? Your health care provider must put on a mask and sterile gloves. ? The skin over your port is cleaned carefully with an antiseptic and allowed to dry. ? The port is gently pinched between sterile gloves, and a needle is  inserted into the port.  Only "non-coring" port needles should be used to access the port. Once the port is accessed, a blood return should be checked. This helps ensure that the port is in the vein and is not clogged.  If your port needs to remain accessed for a constant infusion, a clear (transparent) bandage will be placed over the needle site. The bandage and needle will need to be changed every week, or as directed by your health care provider.  Keep the bandage covering the needle clean and dry. Do not get it wet. Follow your health care providers instructions on how to take a shower or bath while the port is accessed.  If your port does not need to stay accessed, no bandage is needed over the port.  What is flushing? Flushing helps keep the port from getting clogged. Follow your health care providers instructions on how and when to flush the port. Ports are usually flushed with saline solution or a medicine called heparin. The need for flushing will depend on how the port is used.  If the port is used for intermittent medicines or blood draws, the port will need to be flushed: ? After medicines have been given. ? After blood has been drawn. ? As part of routine maintenance.  If a constant infusion is running, the port may not need to be flushed.  How long will my port stay implanted? The port can stay in for as long as your health care provider thinks it is needed. When it is time for the port to come out, surgery will be done to remove it. The procedure is similar to the one performed when the port was put in. When should I seek immediate medical care? When you have an implanted port, you should seek immediate medical care if:  You notice a bad smell coming from the incision site.  You have swelling, redness, or drainage at the incision site.  You have more swelling or pain at the port site or the surrounding area.  You have a fever that is not controlled with  medicine.  This information is not intended to replace advice given to you by your health care provider. Make sure you discuss any questions you have with your health care provider. Document Released: 03/20/2005 Document Revised: 08/26/2015 Document Reviewed: 11/25/2012 Elsevier Interactive Patient Education  2017 [place].   CAN RESTART XARELTO ON Friday 09/22/2016 IF NO ISSUES WITH PROCEDURE SITE PER DR HASSELL

## 2016-09-21 NOTE — Sedation Documentation (Signed)
Patient is resting comfortably. 

## 2016-09-22 ENCOUNTER — Ambulatory Visit (HOSPITAL_BASED_OUTPATIENT_CLINIC_OR_DEPARTMENT_OTHER): Payer: Managed Care, Other (non HMO)

## 2016-09-22 ENCOUNTER — Ambulatory Visit
Admission: RE | Admit: 2016-09-22 | Discharge: 2016-09-22 | Disposition: A | Discharge status: Home / Self Care | Payer: 59 | Source: Ambulatory Visit | Attending: Radiation Oncology | Admitting: Radiation Oncology

## 2016-09-22 VITALS — BP 151/75 | HR 63 | Temp 98.1°F | Resp 18

## 2016-09-22 DIAGNOSIS — Z5111 Encounter for antineoplastic chemotherapy: Secondary | ICD-10-CM | POA: Diagnosis not present

## 2016-09-22 DIAGNOSIS — Z510 Encounter for antineoplastic radiation therapy: Secondary | ICD-10-CM | POA: Diagnosis not present

## 2016-09-22 DIAGNOSIS — C3411 Malignant neoplasm of upper lobe, right bronchus or lung: Secondary | ICD-10-CM

## 2016-09-22 MED ORDER — PALONOSETRON HCL INJECTION 0.25 MG/5ML
0.2500 mg | Freq: Once | INTRAVENOUS | Status: AC
  Administered 2016-09-22: 0.25 mg via INTRAVENOUS

## 2016-09-22 MED ORDER — FAMOTIDINE IN NACL 20-0.9 MG/50ML-% IV SOLN
20.0000 mg | Freq: Once | INTRAVENOUS | Status: AC
  Administered 2016-09-22: 20 mg via INTRAVENOUS

## 2016-09-22 MED ORDER — LIDOCAINE-PRILOCAINE 2.5-2.5 % EX CREA: TOPICAL_CREAM | CUTANEOUS | 0 refills | Status: DC

## 2016-09-22 MED ORDER — PALONOSETRON HCL INJECTION 0.25 MG/5ML
INTRAVENOUS | Status: AC
  Filled 2016-09-22: qty 5

## 2016-09-22 MED ORDER — PACLITAXEL CHEMO INJECTION 300 MG/50ML
45.0000 mg/m2 | Freq: Once | INTRAVENOUS | Status: AC
  Administered 2016-09-22: 120 mg via INTRAVENOUS
  Filled 2016-09-22: qty 20

## 2016-09-22 MED ORDER — SODIUM CHLORIDE 0.9 % IV SOLN
Freq: Once | INTRAVENOUS | Status: AC
  Administered 2016-09-22: 13:00:00 via INTRAVENOUS

## 2016-09-22 MED ORDER — DIPHENHYDRAMINE HCL 50 MG/ML IJ SOLN
50.0000 mg | Freq: Once | INTRAMUSCULAR | Status: AC
  Administered 2016-09-22: 50 mg via INTRAVENOUS

## 2016-09-22 MED ORDER — FAMOTIDINE IN NACL 20-0.9 MG/50ML-% IV SOLN
INTRAVENOUS | Status: AC
  Filled 2016-09-22: qty 50

## 2016-09-22 MED ORDER — SODIUM CHLORIDE 0.9 % IV SOLN
20.0000 mg | Freq: Once | INTRAVENOUS | Status: AC
  Administered 2016-09-22: 20 mg via INTRAVENOUS
  Filled 2016-09-22: qty 2

## 2016-09-22 MED ORDER — SODIUM CHLORIDE 0.9% FLUSH
10.0000 mL | INTRAVENOUS | Status: DC | PRN
  Administered 2016-09-22: 10 mL
  Filled 2016-09-22: qty 10

## 2016-09-22 MED ORDER — HEPARIN SOD (PORK) LOCK FLUSH 100 UNIT/ML IV SOLN
500.0000 [IU] | Freq: Once | INTRAVENOUS | Status: AC | PRN
  Administered 2016-09-22: 500 [IU]
  Filled 2016-09-22: qty 5

## 2016-09-22 MED ORDER — DIPHENHYDRAMINE HCL 50 MG/ML IJ SOLN
INTRAMUSCULAR | Status: AC
  Filled 2016-09-22: qty 1

## 2016-09-22 MED ORDER — SODIUM CHLORIDE 0.9 % IV SOLN
220.0000 mg | Freq: Once | INTRAVENOUS | Status: AC
  Administered 2016-09-22: 220 mg via INTRAVENOUS
  Filled 2016-09-22: qty 22

## 2016-09-22 NOTE — Patient Instructions (Signed)
[facility] Discharge Instructions for Patients Receiving Chemotherapy  Today you received the following chemotherapy agents taxol/carboplatin   To help prevent nausea and vomiting after your treatment, we encourage you to take your nausea medication as direcred. If you develop nausea and vomiting that is not controlled by your nausea medication, call the clinic.   BELOW ARE SYMPTOMS THAT SHOULD BE REPORTED IMMEDIATELY:  *FEVER GREATER THAN 100.5 F  *CHILLS WITH OR WITHOUT FEVER  NAUSEA AND VOMITING THAT IS NOT CONTROLLED WITH YOUR NAUSEA MEDICATION  *UNUSUAL SHORTNESS OF BREATH  *UNUSUAL BRUISING OR BLEEDING  TENDERNESS IN MOUTH AND THROAT WITH OR WITHOUT PRESENCE OF ULCERS  *URINARY PROBLEMS  *BOWEL PROBLEMS  UNUSUAL RASH Items with * indicate a potential emergency and should be followed up as soon as possible.  Feel free to call the clinic you have any questions or concerns. The clinic phone number is [phone].

## 2016-09-25 ENCOUNTER — Ambulatory Visit (HOSPITAL_BASED_OUTPATIENT_CLINIC_OR_DEPARTMENT_OTHER): Payer: Managed Care, Other (non HMO)

## 2016-09-25 ENCOUNTER — Other Ambulatory Visit (HOSPITAL_BASED_OUTPATIENT_CLINIC_OR_DEPARTMENT_OTHER): Payer: Managed Care, Other (non HMO)

## 2016-09-25 ENCOUNTER — Ambulatory Visit (HOSPITAL_BASED_OUTPATIENT_CLINIC_OR_DEPARTMENT_OTHER): Payer: Managed Care, Other (non HMO) | Admitting: Internal Medicine

## 2016-09-25 ENCOUNTER — Encounter (HOSPITAL_COMMUNITY): Payer: Self-pay

## 2016-09-25 ENCOUNTER — Encounter: Payer: Self-pay | Admitting: Internal Medicine

## 2016-09-25 ENCOUNTER — Telehealth: Payer: Self-pay | Admitting: Internal Medicine

## 2016-09-25 ENCOUNTER — Ambulatory Visit
Admission: RE | Admit: 2016-09-25 | Discharge: 2016-09-25 | Disposition: A | Discharge status: Home / Self Care | Payer: 59 | Source: Ambulatory Visit | Attending: Radiation Oncology | Admitting: Radiation Oncology

## 2016-09-25 ENCOUNTER — Ambulatory Visit: Payer: Managed Care, Other (non HMO)

## 2016-09-25 VITALS — BP 158/98 | HR 66 | Temp 97.7°F | Resp 20 | Ht 73.0 in | Wt 292.9 lb

## 2016-09-25 DIAGNOSIS — Z95828 Presence of other vascular implants and grafts: Secondary | ICD-10-CM

## 2016-09-25 DIAGNOSIS — C3411 Malignant neoplasm of upper lobe, right bronchus or lung: Secondary | ICD-10-CM

## 2016-09-25 DIAGNOSIS — C3491 Malignant neoplasm of unspecified part of right bronchus or lung: Secondary | ICD-10-CM

## 2016-09-25 DIAGNOSIS — Z510 Encounter for antineoplastic radiation therapy: Secondary | ICD-10-CM | POA: Diagnosis not present

## 2016-09-25 DIAGNOSIS — Z5111 Encounter for antineoplastic chemotherapy: Secondary | ICD-10-CM

## 2016-09-25 LAB — COMPREHENSIVE METABOLIC PANEL
ALT: 16 U/L (ref 0–55)
ANION GAP: 5 meq/L (ref 3–11)
AST: 18 U/L (ref 5–34)
Albumin: 3.2 g/dL — ABNORMAL LOW (ref 3.5–5.0)
Alkaline Phosphatase: 109 U/L (ref 40–150)
BILIRUBIN TOTAL: 0.47 mg/dL (ref 0.20–1.20)
BUN: 21 mg/dL (ref 7.0–26.0)
CALCIUM: 8.8 mg/dL (ref 8.4–10.4)
CHLORIDE: 109 meq/L (ref 98–109)
CO2: 28 meq/L (ref 22–29)
Creatinine: 1.3 mg/dL (ref 0.7–1.3)
EGFR: 65 mL/min/{1.73_m2} — AB (ref >90)
Glucose: 85 mg/dl (ref 70–140)
Potassium: 4.1 mEq/L (ref 3.5–5.1)
Sodium: 142 mEq/L (ref 136–145)
Total Protein: 6.1 g/dL — ABNORMAL LOW (ref 6.4–8.3)

## 2016-09-25 LAB — CBC WITH DIFFERENTIAL/PLATELET
BASO%: 0.3 % (ref 0.0–2.0)
BASOS ABS: 0 10*3/uL (ref 0.0–0.1)
EOS ABS: 0 10*3/uL (ref 0.0–0.5)
EOS%: 1 % (ref 0.0–7.0)
HCT: 33.4 % — ABNORMAL LOW (ref 38.4–49.9)
HGB: 10.6 g/dL — ABNORMAL LOW (ref 13.0–17.1)
LYMPH%: 7.9 % — AB (ref 14.0–49.0)
MCH: 28.9 pg (ref 27.2–33.4)
MCHC: 31.7 g/dL — AB (ref 32.0–36.0)
MCV: 91 fL (ref 79.3–98.0)
MONO#: 0.3 10*3/uL (ref 0.1–0.9)
MONO%: 9.2 % (ref 0.0–14.0)
NEUT#: 2.4 10*3/uL (ref 1.5–6.5)
NEUT%: 81.6 % — AB (ref 39.0–75.0)
PLATELETS: 111 10*3/uL — AB (ref 140–400)
RBC: 3.67 10*6/uL — AB (ref 4.20–5.82)
RDW: 14.8 % — ABNORMAL HIGH (ref 11.0–14.6)
WBC: 2.9 10*3/uL — ABNORMAL LOW (ref 4.0–10.3)
lymph#: 0.2 10*3/uL — ABNORMAL LOW (ref 0.9–3.3)
nRBC: 0 % (ref 0–0)

## 2016-09-25 MED ORDER — HEPARIN SOD (PORK) LOCK FLUSH 100 UNIT/ML IV SOLN
500.0000 [IU] | Freq: Once | INTRAVENOUS | Status: AC
  Administered 2016-09-25: 500 [IU]
  Filled 2016-09-25: qty 5

## 2016-09-25 MED ORDER — SODIUM CHLORIDE 0.9% FLUSH
10.0000 mL | Freq: Once | INTRAVENOUS | Status: AC
  Administered 2016-09-25: 10 mL
  Filled 2016-09-25: qty 10

## 2016-09-25 NOTE — Telephone Encounter (Signed)
Cancelled 6/29 appt per MM - patient will have next treatment on 6/29. No additional appts scheduled - all appts already scheduled - gave patient AVS and calender.

## 2016-09-25 NOTE — Progress Notes (Signed)
Jason [phone]   [phone]  OFFICE PROGRESS NOTE  Lucianne Lei, MD [address] [place] [address]  DIAGNOSIS: Stage IIIa (T1a, N2, M0) non-small cell lung cancer, adenocarcinoma presented with right upper lobe lung nodule in addition to mediastinal lymphadenopathy diagnosed in March 2018.  PRIOR THERAPY: None.  CURRENT THERAPY: course of concurrent chemoradiation with weekly carboplatin for AUC of 2 and paclitaxel 45 MG/M2.  INTERVAL HISTORY: Jason Russell 66 y.o. male returns to the clinic today for follow-up visit. The patient is currently undergoing a course of concurrent chemoradiation with weekly carboplatin and paclitaxel status post 3 cycles and tolerating his treatment well. He denied having any dysphagia or odynophagia. He denied having any fever or chills. He has no nausea, vomiting, diarrhea or constipation. He has no fever or chills. He has no significant weight loss or night sweats. He is here today for evaluation before starting cycle #4.  MEDICAL HISTORY: Past Medical History:  Diagnosis Date  . Adenocarcinoma of right lung, stage 3 ([place]) 08/23/2016  . Adenocarcinoma of right lung, stage 3 ([place]) 08/23/2016  . Anemia   . Arthritis    "hx right hip"  . Asthma    "when I was a child"  . Atrial fibrillation (HCC)    Amiodarone started 10/2011; Coumadin  . 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 St. Jude ICD implantation 10/03/12.  . Chronic anticoagulation   . Chronic systolic heart failure ([place])    a. Echo 7/13: EF 25%;  b. echo 04/2012:  Mild LVH, EF 30-35%, Gr 1 DD, mild AI, mild MR, mild LAE  . COPD (chronic obstructive pulmonary disease) ([place])   . Dyslipidemia   . Dysrhythmia   . Encounter for antineoplastic chemotherapy 08/24/2016  . Gout   . History of blood transfusion 10/15/2013   "don't know where the blood's going; HgB down to 5"  . Hyperlipidemia   . Hypertension     . Hypothyroidism   . NICM (nonischemic cardiomyopathy) ([place])    [place] 4/14:  minimal CAD  . Obesity   . OSA on CPAP   . Tobacco abuse     ALLERGIES:  is allergic to no known allergies.  MEDICATIONS:  Current Outpatient Prescriptions  Medication Sig Dispense Refill  . acetaminophen (TYLENOL) 500 MG tablet Take 1,000 mg by mouth every 6 (six) hours as needed for mild pain.    [name] amiodarone (PACERONE) 100 MG tablet Take 1 tablet (100 mg total) by mouth daily. 90 tablet 2  . atorvastatin (LIPITOR) 20 MG tablet TAKE 1 TABLET BY MOUTH EVERY DAY 90 tablet 3  . buPROPion (WELLBUTRIN SR) 150 MG 12 hr tablet TAKE 1 TABLET (150 MG TOTAL) BY MOUTH 2 (TWO) TIMES DAILY. (Patient taking differently: Take 150 mg by mouth 2 (two) times daily. ) 180 tablet 2  . carvedilol (COREG) 12.5 MG tablet TAKE 1 TABLET (12.5 MG TOTAL) BY MOUTH 2 (TWO) TIMES DAILY. 180 tablet 3  . colchicine 0.6 MG tablet Take 1 tablet by mouth as needed (gout flares).     . ferrous gluconate (FERGON) 324 MG tablet Take 1 tablet (324 mg total) by mouth 3 (three) times daily with meals. 90 tablet 8  . furosemide (LASIX) 20 MG tablet Take 3 tabs (60 mg) every Tue and Sat, all other days take 2 tabs (40 mg) (Patient taking differently: Take 40-60 mg by mouth See admin instructions. Take 3 tabs (60  mg) every Tue and Sat, all other days take 2 tabs (40 mg)) 64 tablet 6  . hydrALAZINE (APRESOLINE) 100 MG tablet Take 1 tablet by mouth 3 (three) times daily.  1  . levothyroxine (SYNTHROID, LEVOTHROID) 50 MCG tablet TAKE 1 TABLET (50 MCG TOTAL) BY MOUTH DAILY BEFORE BREAKFAST. 90 tablet 3  . lidocaine-prilocaine (EMLA) cream Apply generous amount to port site at least 1 hr prior to treatment.  DO NOT RUB IN. 30 g 0  . lisinopril (PRINIVIL,ZESTRIL) 40 MG tablet TAKE 1 TABLET BY MOUTH DAILY. 90 tablet 2  . Polyvinyl Alcohol-Povidone (REFRESH OP) Apply 1 drop to eye daily.    . potassium chloride SA (KLOR-CON M20) 20 MEQ tablet Take 1 tablet every  day.  On days when lasix is taken, take 2 tablets (Patient taking differently: Take 20 mEq by mouth See admin instructions. Take 1 tablet every day.  On days when lasix is taken, take 2 tablets) 135 tablet 3  . rivaroxaban (XARELTO) 20 MG TABS tablet Take 1 tablet (20 mg total) by mouth daily. 90 tablet 2  . sildenafil (REVATIO) 20 MG tablet Take 1 tablet (20 mg total) by mouth at bedtime. Take 2-3 tablets daily prn for ED 30 tablet 2  . spironolactone (ALDACTONE) 25 MG tablet TAKE 0.5 TABLETS (12.5 MG TOTAL) BY MOUTH DAILY. 45 tablet 2  . ULORIC 40 MG tablet Take 1 tablet by mouth daily.    . Vitamin D, Ergocalciferol, (DRISDOL) 50000 units CAPS capsule Take 1 capsule by mouth once a week. Monday    . albuterol (PROVENTIL HFA;VENTOLIN HFA) 108 (90 Base) MCG/ACT inhaler Inhale 2 puffs into the lungs every 4 (four) hours as needed for wheezing or shortness of breath. (Patient not taking: Reported on 09/25/2016) 1 Inhaler 2  . prochlorperazine (COMPAZINE) 10 MG tablet Take 1 tablet (10 mg total) by mouth every 6 (six) hours as needed for nausea or vomiting. (Patient not taking: Reported on 09/11/2016) 30 tablet 0  . Wound Cleansers (RADIAPLEX EX) Apply topically.     No current facility-administered medications for this visit.     SURGICAL HISTORY:  Past Surgical History:  Procedure Laterality Date  . CARDIAC DEFIBRILLATOR PLACEMENT  2014  . CARDIOVERSION  2011  . COLONOSCOPY WITH PROPOFOL Left 10/17/2013   Procedure: COLONOSCOPY WITH PROPOFOL;  Surgeon: Inda Castle, MD;  Location: [facility];  Service: Endoscopy;  Laterality: Left;  . ESOPHAGOGASTRODUODENOSCOPY N/A 10/17/2013   Procedure: ESOPHAGOGASTRODUODENOSCOPY (EGD);  Surgeon: Inda Castle, MD;  Location: [facility];  Service: Endoscopy;  Laterality: N/A;  . GIVENS CAPSULE STUDY N/A 10/29/2013   Procedure: GIVENS CAPSULE STUDY;  Surgeon: Inda Castle, MD;  Location: [facility];  Service: Endoscopy;  Laterality: N/A;  .  IMPLANTABLE CARDIOVERTER DEFIBRILLATOR IMPLANT Left 10/03/2012   Procedure: IMPLANTABLE CARDIOVERTER DEFIBRILLATOR IMPLANT;  Surgeon: Deboraha Sprang, MD;  Location[facility] CATH LAB;  Service: Cardiovascular;  Laterality: Left;  . IR FLUORO GUIDE PORT INSERTION RIGHT  09/21/2016  . IR US GUIDE VASC ACCESS RIGHT  09/21/2016  . JOINT REPLACEMENT    . TONSILLECTOMY  1950's  . TOTAL HIP ARTHROPLASTY Right 11/25/1997  . VIDEO BRONCHOSCOPY WITH ENDOBRONCHIAL ULTRASOUND N/A 08/09/2016   Procedure: VIDEO BRONCHOSCOPY WITH ENDOBRONCHIAL ULTRASOUND;  Surgeon: Javier Glazier, MD;  Location: [facility] OR;  Service: Thoracic;  Laterality: N/A;    REVIEW OF SYSTEMS:  A comprehensive review of systems was negative.   PHYSICAL EXAMINATION: General appearance: alert, cooperative, fatigued and no distress Head:  Normocephalic, without obvious abnormality, atraumatic Neck: no adenopathy, no JVD, supple, symmetrical, trachea midline and thyroid not enlarged, symmetric, no tenderness/mass/nodules Lymph nodes: Cervical, supraclavicular, and axillary nodes normal. Resp: clear to auscultation bilaterally Back: symmetric, no curvature. ROM normal. No CVA tenderness. Cardio: regular rate and rhythm, S1, S2 normal, no murmur, click, rub or gallop GI: soft, non-tender; bowel sounds normal; no masses,  no organomegaly Genitalia: defer exam Extremities: extremities normal, atraumatic, no cyanosis or edema  ECOG PERFORMANCE STATUS: 1 - Symptomatic but completely ambulatory  Blood pressure (!) 158/98, pulse 66, temperature 97.7 F (36.5 C), temperature source Oral, resp. rate 20, height 6\' 1"  (1.854 m), weight 292 lb 14.4 oz (132.9 kg), SpO2 100 %.  LABORATORY DATA: Lab Results  Component Value Date   WBC 2.9 (L) 09/25/2016   HGB 10.6 (L) 09/25/2016   HCT 33.4 (L) 09/25/2016   MCV 91.0 09/25/2016   PLT 111 (L) 09/25/2016      Chemistry      Component Value Date/Time   NA 143 09/21/2016 1209   NA 144 09/18/2016 1109   K  3.7 09/21/2016 1209   K 3.2 (L) 09/18/2016 1109   CL 106 09/21/2016 1209   CO2 29 09/21/2016 1209   CO2 25 09/18/2016 1109   BUN 29 (H) 09/21/2016 1209   BUN 25.8 09/18/2016 1109   CREATININE 1.50 (H) 09/21/2016 1209   CREATININE 1.9 (H) 09/18/2016 1109      Component Value Date/Time   CALCIUM 9.0 09/21/2016 1209   CALCIUM 9.6 09/18/2016 1109   ALKPHOS 124 09/18/2016 1109   AST 22 09/18/2016 1109   ALT 17 09/18/2016 1109   BILITOT 0.38 09/18/2016 1109       RADIOGRAPHIC STUDIES: Ct Head W Wo Contrast  Result Date: 09/08/2016 CLINICAL DATA:  66 year old male with stage III right lung adenocarcinoma recently diagnosed. EXAM: CT HEAD WITHOUT AND WITH CONTRAST TECHNIQUE: Contiguous axial images were obtained from the base of the skull through the vertex without and with intravenous contrast CONTRAST:  16mL ISOVUE-300 IOPAMIDOL (ISOVUE-300) INJECTION 61% COMPARISON:  CT-guided right lung biopsy 09/06/2016 and earlier. FINDINGS: Brain: Cerebral volume is within normal limits for age. No midline shift, ventriculomegaly, mass effect, evidence of mass lesion, intracranial hemorrhage or evidence of cortically based acute infarction. Minimal to mild nonspecific cerebral white matter hypodensity. No cortical encephalomalacia identified. No abnormal enhancement identified. Vascular: Major intracranial vascular structures are enhancing. Skull: Bone mineralization is within normal limits. No skull lesion identified. Sinuses/Orbits: Polypoid areas of mucosal thickening in the frontal sinuses. Mild paranasal sinus mucosal thickening elsewhere. Hyperplastic sinuses. Tympanic cavities and mastoids are clear. Other: No acute orbit or scalp soft tissue findings. IMPRESSION: 1. No metastatic disease or acute intracranial abnormality. 2. Mild paranasal sinus disease, possible mild sinus polyposis. Electronically Signed   By: Genevie Ann M.D.   On: 09/08/2016 13:30   Ir US Guide Vasc Access Right  Result Date:  09/21/2016 CLINICAL DATA:  Lung carcinoma, needs durable venous access for chemotherapy regimen. EXAM: TUNNELED PORT CATHETER PLACEMENT WITH ULTRASOUND AND FLUOROSCOPIC GUIDANCE FLUOROSCOPY TIME:  0.1 minute, 65  uGym2 DAP ANESTHESIA/SEDATION: Intravenous Fentanyl and Versed were administered as conscious sedation during continuous monitoring of the patient's level of consciousness and physiological / cardiorespiratory status by the radiology RN, with a total moderate sedation time of 13 minutes. TECHNIQUE: The procedure, risks, benefits, and alternatives were explained to the patient. Questions regarding the procedure were encouraged and answered. The patient understands and consents to the procedure.  As antibiotic prophylaxis, cefazolin 2 g was ordered pre-procedure and administered intravenously within one hour of incision. Patency of the right IJ vein was confirmed with ultrasound with image documentation. An appropriate skin site was determined. Skin site was marked. Region was prepped using maximum barrier technique including cap and mask, sterile gown, sterile gloves, large sterile sheet, and Chlorhexidine as cutaneous antisepsis. The region was infiltrated locally with 1% lidocaine. Under real-time ultrasound guidance, the right IJ vein was accessed with a 21 gauge micropuncture needle; the needle tip within the vein was confirmed with ultrasound image documentation. Needle was exchanged over a 018 guidewire for transitional dilator which allowed passage of the [facility] wire into the IVC. Over this, the transitional dilator was exchanged for a 5 [place] MPA catheter. A small incision was made on the right anterior chest wall and a subcutaneous pocket fashioned. The power-injectable port was positioned and its catheter tunneled to the right IJ dermatotomy site. The MPA catheter was exchanged over an Amplatz wire for a peel-away sheath, through which the port catheter, which had been trimmed to the appropriate  length, was advanced and positioned under fluoroscopy with its tip at the cavoatrial junction. Spot chest radiograph confirms good catheter position and no pneumothorax. The pocket was closed with deep interrupted and subcuticular continuous 3-0 Monocryl sutures. The port was flushed per protocol. The incisions were covered with Dermabond then covered with a sterile dressing. COMPLICATIONS: COMPLICATIONS None immediate IMPRESSION: Technically successful right IJ power-injectable port catheter placement. Ready for routine use. Electronically Signed   By: Lucrezia Europe M.D.   On: 09/21/2016 14:42   Ct Biopsy  Result Date: 09/06/2016 INDICATION: STAGE III RIGHT LUNG ADENOCARCINOMA EXAM: CT-GUIDED BIOPSY RIGHT UPPER LOBE NODULE CORE BIOPSY MEDICATIONS: 1% lidocaine locally ANESTHESIA/SEDATION: 1.5 mg IV Versed; 75 mcg IV Fentanyl Moderate Sedation Time:  29 minutes The patient was continuously monitored during the procedure by the interventional radiology nurse under my direct supervision. PROCEDURE: The procedure, risks, benefits, and alternatives were explained to the patient. Questions regarding the procedure were encouraged and answered. The patient understands and consents to the procedure. The right upper posterior back was prepped with ChloraPrep in a sterile fashion, and a sterile drape was applied covering the operative field. A sterile gown and sterile gloves were used for the procedure. Previous imaging reviewed. Patient positioned prone. Noncontrast localization CT performed. The posterior right upper lobe nodule was localized. Overlying skin marked. Under sterile conditions and local anesthesia, a 17 gauge coaxial guide needle was advanced to the lung nodule under CT guidance. Because of the overlying rib, there was difficulty advancing the needle to the small nodule. Eventually, 18 gauge core biopsies were attempted. Small fragments were obtained. After several passes, a small amount of surrounding  pulmonary hemorrhage developed as well as a small pneumothorax. Needle was retracted. Attention made to syringe aspirated pneumothorax. Biosentry device utilized for needle tract occlusion. Postprocedure imaging demonstrates a stable small apical pneumothorax. Patient remained hemodynamically stable and asymptomatic. Patient tolerated the procedure well. Vital sign monitoring by nursing staff during the procedure will continue as patient is in the special procedures unit for post procedure observation. FINDINGS: The images document guide needle placement within the right upper lobe nodule. Post biopsy images demonstrate small pneumothorax and surrounding pulmonary hemorrhage. COMPLICATIONS: SIR Level A - No therapy, no consequence. IMPRESSION: Successful CT-guided core biopsy of the posterior right upper lobe 1.4 cm nodule Electronically Signed   By: Jerilynn Mages.  Shick M.D.   On: 09/06/2016 14:22  Dg Chest Port 1 View  Result Date: 09/06/2016 CLINICAL DATA:  Follow-up of biopsy of a right apical nodule. History of asthma -COPD, atrial fibrillation, former smoker. EXAM: PORTABLE CHEST 1 VIEW COMPARISON:  Chest CT scan of today's date and portable chest x-ray of Aug 09, 2016 FINDINGS: There is a tiny right apical pneumothorax amounting to less than 5% of the lung volume. The interstitial markings in the right upper lobe are slightly more conspicuous as well. Elsewhere no acute pulmonary abnormality is observed. The cardiac silhouette remains enlarged. The central pulmonary vascularity is less prominent than on the May 2018 study. The ICD is in stable position. There is tortuosity of the descending thoracic aorta. IMPRESSION: 5% or less right apical pneumothorax similar to that visible on today's CT scan. Electronically Signed   By: David  Martinique M.D.   On: 09/06/2016 14:40   Ir Fluoro Guide Port Insertion Right  Result Date: 09/21/2016 CLINICAL DATA:  Lung carcinoma, needs durable venous access for chemotherapy regimen.  EXAM: TUNNELED PORT CATHETER PLACEMENT WITH ULTRASOUND AND FLUOROSCOPIC GUIDANCE FLUOROSCOPY TIME:  0.1 minute, 65  uGym2 DAP ANESTHESIA/SEDATION: Intravenous Fentanyl and Versed were administered as conscious sedation during continuous monitoring of the patient's level of consciousness and physiological / cardiorespiratory status by the radiology RN, with a total moderate sedation time of 13 minutes. TECHNIQUE: The procedure, risks, benefits, and alternatives were explained to the patient. Questions regarding the procedure were encouraged and answered. The patient understands and consents to the procedure. As antibiotic prophylaxis, cefazolin 2 g was ordered pre-procedure and administered intravenously within one hour of incision. Patency of the right IJ vein was confirmed with ultrasound with image documentation. An appropriate skin site was determined. Skin site was marked. Region was prepped using maximum barrier technique including cap and mask, sterile gown, sterile gloves, large sterile sheet, and Chlorhexidine as cutaneous antisepsis. The region was infiltrated locally with 1% lidocaine. Under real-time ultrasound guidance, the right IJ vein was accessed with a 21 gauge micropuncture needle; the needle tip within the vein was confirmed with ultrasound image documentation. Needle was exchanged over a 018 guidewire for transitional dilator which allowed passage of the [facility] wire into the IVC. Over this, the transitional dilator was exchanged for a 5 [place] MPA catheter. A small incision was made on the right anterior chest wall and a subcutaneous pocket fashioned. The power-injectable port was positioned and its catheter tunneled to the right IJ dermatotomy site. The MPA catheter was exchanged over an Amplatz wire for a peel-away sheath, through which the port catheter, which had been trimmed to the appropriate length, was advanced and positioned under fluoroscopy with its tip at the cavoatrial junction. Spot  chest radiograph confirms good catheter position and no pneumothorax. The pocket was closed with deep interrupted and subcuticular continuous 3-0 Monocryl sutures. The port was flushed per protocol. The incisions were covered with Dermabond then covered with a sterile dressing. COMPLICATIONS: COMPLICATIONS None immediate IMPRESSION: Technically successful right IJ power-injectable port catheter placement. Ready for routine use. Electronically Signed   By: Lucrezia Europe M.D.   On: 09/21/2016 14:42    ASSESSMENT AND PLAN: This is a very pleasant 66 years old African-American male with a stage IIIa non-small cell lung cancer, adenocarcinoma currently undergoing a course of concurrent chemoradiation with weekly carboplatin and paclitaxel is status post 3 cycles and has been tolerating his treatment fairly well. I recommended for the patient to proceed with cycle #4 today as scheduled. I would see him back  for follow-up visit in 2 weeks for reevaluation before starting cycle #6. He was advised to call immediately if he has any concerning symptoms in the interval. The patient voices understanding of current disease status and treatment options and is in agreement with the current care plan. All questions were answered. The patient knows to call the clinic with any problems, questions or concerns. We can certainly see the patient much sooner if necessary.  I spent 10 minutes counseling the patient face to face. The total time spent in the appointment was 15 minutes.  Disclaimer: This note was dictated with voice recognition software. Similar sounding words can inadvertently be transcribed and may not be corrected upon review.

## 2016-09-26 ENCOUNTER — Ambulatory Visit
Admission: RE | Admit: 2016-09-26 | Discharge: 2016-09-26 | Disposition: A | Discharge status: Home / Self Care | Payer: 59 | Source: Ambulatory Visit | Attending: Radiation Oncology | Admitting: Radiation Oncology

## 2016-09-26 DIAGNOSIS — Z510 Encounter for antineoplastic radiation therapy: Secondary | ICD-10-CM | POA: Diagnosis not present

## 2016-09-27 ENCOUNTER — Ambulatory Visit
Admission: RE | Admit: 2016-09-27 | Discharge: 2016-09-27 | Disposition: A | Discharge status: Home / Self Care | Payer: 59 | Source: Ambulatory Visit | Attending: Radiation Oncology | Admitting: Radiation Oncology

## 2016-09-27 DIAGNOSIS — Z510 Encounter for antineoplastic radiation therapy: Secondary | ICD-10-CM | POA: Diagnosis not present

## 2016-09-28 ENCOUNTER — Ambulatory Visit
Admission: RE | Admit: 2016-09-28 | Discharge: 2016-09-28 | Disposition: A | Discharge status: Home / Self Care | Payer: 59 | Source: Ambulatory Visit | Attending: Radiation Oncology | Admitting: Radiation Oncology

## 2016-09-28 DIAGNOSIS — Z510 Encounter for antineoplastic radiation therapy: Secondary | ICD-10-CM | POA: Diagnosis not present

## 2016-09-29 ENCOUNTER — Other Ambulatory Visit: Payer: Self-pay | Admitting: Radiation Oncology

## 2016-09-29 ENCOUNTER — Ambulatory Visit
Admission: RE | Admit: 2016-09-29 | Discharge: 2016-09-29 | Disposition: A | Discharge status: Home / Self Care | Payer: 59 | Source: Ambulatory Visit | Attending: Radiation Oncology | Admitting: Radiation Oncology

## 2016-09-29 ENCOUNTER — Ambulatory Visit: Payer: Managed Care, Other (non HMO)

## 2016-09-29 DIAGNOSIS — K209 Esophagitis, unspecified without bleeding: Secondary | ICD-10-CM

## 2016-09-29 DIAGNOSIS — Z510 Encounter for antineoplastic radiation therapy: Secondary | ICD-10-CM | POA: Diagnosis not present

## 2016-09-29 DIAGNOSIS — C3411 Malignant neoplasm of upper lobe, right bronchus or lung: Secondary | ICD-10-CM

## 2016-09-29 MED ORDER — SUCRALFATE 1 G PO TABS: 1.0000 g | ORAL_TABLET | Freq: Three times a day (TID) | ORAL | 2 refills | Status: DC

## 2016-10-01 DIAGNOSIS — Z95810 Presence of automatic (implantable) cardiac defibrillator: Secondary | ICD-10-CM | POA: Diagnosis not present

## 2016-10-01 DIAGNOSIS — J449 Chronic obstructive pulmonary disease, unspecified: Secondary | ICD-10-CM | POA: Diagnosis not present

## 2016-10-01 DIAGNOSIS — E039 Hypothyroidism, unspecified: Secondary | ICD-10-CM | POA: Diagnosis not present

## 2016-10-01 DIAGNOSIS — Z801 Family history of malignant neoplasm of trachea, bronchus and lung: Secondary | ICD-10-CM | POA: Diagnosis not present

## 2016-10-01 DIAGNOSIS — Z87891 Personal history of nicotine dependence: Secondary | ICD-10-CM | POA: Diagnosis not present

## 2016-10-01 DIAGNOSIS — I428 Other cardiomyopathies: Secondary | ICD-10-CM | POA: Diagnosis not present

## 2016-10-01 DIAGNOSIS — E669 Obesity, unspecified: Secondary | ICD-10-CM | POA: Diagnosis not present

## 2016-10-01 DIAGNOSIS — Z7901 Long term (current) use of anticoagulants: Secondary | ICD-10-CM | POA: Diagnosis not present

## 2016-10-01 DIAGNOSIS — C3411 Malignant neoplasm of upper lobe, right bronchus or lung: Secondary | ICD-10-CM | POA: Diagnosis not present

## 2016-10-01 DIAGNOSIS — G4733 Obstructive sleep apnea (adult) (pediatric): Secondary | ICD-10-CM | POA: Diagnosis not present

## 2016-10-01 DIAGNOSIS — I110 Hypertensive heart disease with heart failure: Secondary | ICD-10-CM | POA: Diagnosis not present

## 2016-10-01 DIAGNOSIS — I4891 Unspecified atrial fibrillation: Secondary | ICD-10-CM | POA: Diagnosis not present

## 2016-10-01 DIAGNOSIS — R590 Localized enlarged lymph nodes: Secondary | ICD-10-CM | POA: Diagnosis not present

## 2016-10-01 DIAGNOSIS — E785 Hyperlipidemia, unspecified: Secondary | ICD-10-CM | POA: Diagnosis not present

## 2016-10-01 DIAGNOSIS — I5022 Chronic systolic (congestive) heart failure: Secondary | ICD-10-CM | POA: Diagnosis not present

## 2016-10-01 DIAGNOSIS — Z510 Encounter for antineoplastic radiation therapy: Secondary | ICD-10-CM | POA: Diagnosis not present

## 2016-10-01 DIAGNOSIS — Z79899 Other long term (current) drug therapy: Secondary | ICD-10-CM | POA: Diagnosis not present

## 2016-10-01 DIAGNOSIS — Z8249 Family history of ischemic heart disease and other diseases of the circulatory system: Secondary | ICD-10-CM | POA: Diagnosis not present

## 2016-10-02 ENCOUNTER — Ambulatory Visit (HOSPITAL_BASED_OUTPATIENT_CLINIC_OR_DEPARTMENT_OTHER): Payer: Medicare Other

## 2016-10-02 ENCOUNTER — Ambulatory Visit
Admission: RE | Admit: 2016-10-02 | Discharge: 2016-10-02 | Disposition: A | Discharge status: Home / Self Care | Payer: 59 | Source: Ambulatory Visit | Attending: Radiation Oncology | Admitting: Radiation Oncology

## 2016-10-02 ENCOUNTER — Other Ambulatory Visit (HOSPITAL_BASED_OUTPATIENT_CLINIC_OR_DEPARTMENT_OTHER): Payer: Medicare Other

## 2016-10-02 ENCOUNTER — Ambulatory Visit: Payer: Managed Care, Other (non HMO)

## 2016-10-02 VITALS — BP 139/77 | HR 86 | Temp 98.9°F | Resp 20

## 2016-10-02 DIAGNOSIS — C3411 Malignant neoplasm of upper lobe, right bronchus or lung: Secondary | ICD-10-CM

## 2016-10-02 DIAGNOSIS — R590 Localized enlarged lymph nodes: Secondary | ICD-10-CM | POA: Diagnosis not present

## 2016-10-02 DIAGNOSIS — Z95828 Presence of other vascular implants and grafts: Secondary | ICD-10-CM

## 2016-10-02 DIAGNOSIS — Z5111 Encounter for antineoplastic chemotherapy: Secondary | ICD-10-CM | POA: Diagnosis not present

## 2016-10-02 DIAGNOSIS — I4891 Unspecified atrial fibrillation: Secondary | ICD-10-CM | POA: Diagnosis not present

## 2016-10-02 DIAGNOSIS — C3491 Malignant neoplasm of unspecified part of right bronchus or lung: Secondary | ICD-10-CM

## 2016-10-02 DIAGNOSIS — Z510 Encounter for antineoplastic radiation therapy: Secondary | ICD-10-CM | POA: Diagnosis not present

## 2016-10-02 DIAGNOSIS — I110 Hypertensive heart disease with heart failure: Secondary | ICD-10-CM | POA: Diagnosis not present

## 2016-10-02 DIAGNOSIS — Z95810 Presence of automatic (implantable) cardiac defibrillator: Secondary | ICD-10-CM | POA: Diagnosis not present

## 2016-10-02 LAB — CBC WITH DIFFERENTIAL/PLATELET
BASO%: 0.3 % (ref 0.0–2.0)
Basophils Absolute: 0 10*3/uL (ref 0.0–0.1)
EOS%: 1.1 % (ref 0.0–7.0)
Eosinophils Absolute: 0 10*3/uL (ref 0.0–0.5)
HEMATOCRIT: 36.1 % — AB (ref 38.4–49.9)
HEMOGLOBIN: 11.7 g/dL — AB (ref 13.0–17.1)
LYMPH#: 0.2 10*3/uL — AB (ref 0.9–3.3)
LYMPH%: 4.5 % — ABNORMAL LOW (ref 14.0–49.0)
MCH: 29.1 pg (ref 27.2–33.4)
MCHC: 32.3 g/dL (ref 32.0–36.0)
MCV: 90.2 fL (ref 79.3–98.0)
MONO#: 0.4 10*3/uL (ref 0.1–0.9)
MONO%: 12 % (ref 0.0–14.0)
NEUT#: 3 10*3/uL (ref 1.5–6.5)
NEUT%: 82.1 % — AB (ref 39.0–75.0)
PLATELETS: 89 10*3/uL — AB (ref 140–400)
RBC: 4 10*6/uL — ABNORMAL LOW (ref 4.20–5.82)
RDW: 15.7 % — AB (ref 11.0–14.6)
WBC: 3.6 10*3/uL — ABNORMAL LOW (ref 4.0–10.3)

## 2016-10-02 LAB — COMPREHENSIVE METABOLIC PANEL
ALBUMIN: 3.3 g/dL — AB (ref 3.5–5.0)
ALK PHOS: 117 U/L (ref 40–150)
ALT: 13 U/L (ref 0–55)
ANION GAP: 11 meq/L (ref 3–11)
AST: 15 U/L (ref 5–34)
BILIRUBIN TOTAL: 0.41 mg/dL (ref 0.20–1.20)
BUN: 20.4 mg/dL (ref 7.0–26.0)
CALCIUM: 9 mg/dL (ref 8.4–10.4)
CHLORIDE: 107 meq/L (ref 98–109)
CO2: 28 mEq/L (ref 22–29)
CREATININE: 1.6 mg/dL — AB (ref 0.7–1.3)
EGFR: 53 mL/min/{1.73_m2} — ABNORMAL LOW (ref >90)
Glucose: 103 mg/dl (ref 70–140)
Potassium: 3.9 mEq/L (ref 3.5–5.1)
Sodium: 147 mEq/L — ABNORMAL HIGH (ref 136–145)
TOTAL PROTEIN: 6.3 g/dL — AB (ref 6.4–8.3)

## 2016-10-02 MED ORDER — SODIUM CHLORIDE 0.9% FLUSH
10.0000 mL | Freq: Once | INTRAVENOUS | Status: AC
  Administered 2016-10-02: 10 mL
  Filled 2016-10-02: qty 10

## 2016-10-02 MED ORDER — DIPHENHYDRAMINE HCL 50 MG/ML IJ SOLN
INTRAMUSCULAR | Status: AC
  Filled 2016-10-02: qty 1

## 2016-10-02 MED ORDER — DIPHENHYDRAMINE HCL 50 MG/ML IJ SOLN
50.0000 mg | Freq: Once | INTRAMUSCULAR | Status: AC
  Administered 2016-10-02: 50 mg via INTRAVENOUS

## 2016-10-02 MED ORDER — FAMOTIDINE IN NACL 20-0.9 MG/50ML-% IV SOLN
INTRAVENOUS | Status: AC
  Filled 2016-10-02: qty 50

## 2016-10-02 MED ORDER — CARBOPLATIN CHEMO INJECTION 450 MG/45ML
222.6000 mg | Freq: Once | INTRAVENOUS | Status: AC
  Administered 2016-10-02: 220 mg via INTRAVENOUS
  Filled 2016-10-02: qty 22

## 2016-10-02 MED ORDER — HEPARIN SOD (PORK) LOCK FLUSH 100 UNIT/ML IV SOLN
500.0000 [IU] | Freq: Once | INTRAVENOUS | Status: AC | PRN
  Administered 2016-10-02: 500 [IU]
  Filled 2016-10-02: qty 5

## 2016-10-02 MED ORDER — PALONOSETRON HCL INJECTION 0.25 MG/5ML
0.2500 mg | Freq: Once | INTRAVENOUS | Status: AC
  Administered 2016-10-02: 0.25 mg via INTRAVENOUS

## 2016-10-02 MED ORDER — SODIUM CHLORIDE 0.9% FLUSH
10.0000 mL | INTRAVENOUS | Status: DC | PRN
  Administered 2016-10-02: 10 mL
  Filled 2016-10-02: qty 10

## 2016-10-02 MED ORDER — SODIUM CHLORIDE 0.9 % IV SOLN
20.0000 mg | Freq: Once | INTRAVENOUS | Status: AC
  Administered 2016-10-02: 20 mg via INTRAVENOUS
  Filled 2016-10-02: qty 2

## 2016-10-02 MED ORDER — PALONOSETRON HCL INJECTION 0.25 MG/5ML
INTRAVENOUS | Status: AC
  Filled 2016-10-02: qty 5

## 2016-10-02 MED ORDER — FAMOTIDINE IN NACL 20-0.9 MG/50ML-% IV SOLN
20.0000 mg | Freq: Once | INTRAVENOUS | Status: AC
  Administered 2016-10-02: 20 mg via INTRAVENOUS

## 2016-10-02 MED ORDER — PACLITAXEL CHEMO INJECTION 300 MG/50ML
45.0000 mg/m2 | Freq: Once | INTRAVENOUS | Status: AC
  Administered 2016-10-02: 120 mg via INTRAVENOUS
  Filled 2016-10-02: qty 20

## 2016-10-02 MED ORDER — SODIUM CHLORIDE 0.9 % IV SOLN
Freq: Once | INTRAVENOUS | Status: AC
  Administered 2016-10-02: 11:00:00 via INTRAVENOUS

## 2016-10-02 NOTE — Patient Instructions (Signed)
[facility] Discharge Instructions for Patients Receiving Chemotherapy  Today you received the following chemotherapy agents Taxol and Carboplatin   To help prevent nausea and vomiting after your treatment, we encourage you to take your nausea medication as directed. No Zofran for 3 days. Take Compazine instead.    If you develop nausea and vomiting that is not controlled by your nausea medication, call the clinic.   BELOW ARE SYMPTOMS THAT SHOULD BE REPORTED IMMEDIATELY:  *FEVER GREATER THAN 100.5 F  *CHILLS WITH OR WITHOUT FEVER  NAUSEA AND VOMITING THAT IS NOT CONTROLLED WITH YOUR NAUSEA MEDICATION  *UNUSUAL SHORTNESS OF BREATH  *UNUSUAL BRUISING OR BLEEDING  TENDERNESS IN MOUTH AND THROAT WITH OR WITHOUT PRESENCE OF ULCERS  *URINARY PROBLEMS  *BOWEL PROBLEMS  UNUSUAL RASH Items with * indicate a potential emergency and should be followed up as soon as possible.  Feel free to call the clinic you have any questions or concerns. The clinic phone number is [phone].  Please show the [place] at check-in to the Emergency Department and triage nurse.    Carboplatin injection What is this medicine? CARBOPLATIN (KAR boe pla tin) is a chemotherapy drug. It targets fast dividing cells, like cancer cells, and causes these cells to die. This medicine is used to treat ovarian cancer and many other cancers. This medicine may be used for other purposes; ask your health care provider or pharmacist if you have questions. COMMON BRAND NAME(S): Paraplatin What should I tell my health care provider before I take this medicine? They need to know if you have any of these conditions: -blood disorders -hearing problems -kidney disease -recent or ongoing radiation therapy -an unusual or allergic reaction to carboplatin, cisplatin, other chemotherapy, other medicines, foods, dyes, or preservatives -pregnant or trying to get pregnant -breast-feeding How should I  use this medicine? This drug is usually given as an infusion into a vein. It is administered in a hospital or clinic by a specially trained health care professional. Talk to your pediatrician regarding the use of this medicine in children. Special care may be needed. Overdosage: If you think you have taken too much of this medicine contact a poison control center or emergency room at once. NOTE: This medicine is only for you. Do not share this medicine with others. What if I miss a dose? It is important not to miss a dose. Call your doctor or health care professional if you are unable to keep an appointment. What may interact with this medicine? -medicines for seizures -medicines to increase blood counts like filgrastim, pegfilgrastim, sargramostim -some antibiotics like amikacin, gentamicin, neomycin, streptomycin, tobramycin -vaccines Talk to your doctor or health care professional before taking any of these medicines: -acetaminophen -aspirin -ibuprofen -ketoprofen -naproxen This list may not describe all possible interactions. Give your health care provider a list of all the medicines, herbs, non-prescription drugs, or dietary supplements you use. Also tell them if you smoke, drink alcohol, or use illegal drugs. Some items may interact with your medicine. What should I watch for while using this medicine? Your condition will be monitored carefully while you are receiving this medicine. You will need important blood work done while you are taking this medicine. This drug may make you feel generally unwell. This is not uncommon, as chemotherapy can affect healthy cells as well as cancer cells. Report any side effects. Continue your course of treatment even though you feel ill unless your doctor tells you to stop. In some cases,  you may be given additional medicines to help with side effects. Follow all directions for their use. Call your doctor or health care professional for advice if you  get a fever, chills or sore throat, or other symptoms of a cold or flu. Do not treat yourself. This drug decreases your body's ability to fight infections. Try to avoid being around people who are sick. This medicine may increase your risk to bruise or bleed. Call your doctor or health care professional if you notice any unusual bleeding. Be careful brushing and flossing your teeth or using a toothpick because you may get an infection or bleed more easily. If you have any dental work done, tell your dentist you are receiving this medicine. Avoid taking products that contain aspirin, acetaminophen, ibuprofen, naproxen, or ketoprofen unless instructed by your doctor. These medicines may hide a fever. Do not become pregnant while taking this medicine. Women should inform their doctor if they wish to become pregnant or think they might be pregnant. There is a potential for serious side effects to an unborn child. Talk to your health care professional or pharmacist for more information. Do not breast-feed an infant while taking this medicine. What side effects may I notice from receiving this medicine? Side effects that you should report to your doctor or health care professional as soon as possible: -allergic reactions like skin rash, itching or hives, swelling of the face, lips, or tongue -signs of infection - fever or chills, cough, sore throat, pain or difficulty passing urine -signs of decreased platelets or bleeding - bruising, pinpoint red spots on the skin, black, tarry stools, nosebleeds -signs of decreased red blood cells - unusually weak or tired, fainting spells, lightheadedness -breathing problems -changes in hearing -changes in vision -chest pain -high blood pressure -low blood counts - This drug may decrease the number of white blood cells, red blood cells and platelets. You may be at increased risk for infections and bleeding. -nausea and vomiting -pain, swelling, redness or irritation  at the injection site -pain, tingling, numbness in the hands or feet -problems with balance, talking, walking -trouble passing urine or change in the amount of urine Side effects that usually do not require medical attention (report to your doctor or health care professional if they continue or are bothersome): -hair loss -loss of appetite -metallic taste in the mouth or changes in taste This list may not describe all possible side effects. Call your doctor for medical advice about side effects. You may report side effects to FDA at 1-800-FDA-1088. Where should I keep my medicine? This drug is given in a hospital or clinic and will not be stored at home. NOTE: This sheet is a summary. It may not cover all possible information. If you have questions about this medicine, talk to your doctor, pharmacist, or health care provider.  2018 [employer]/Gold Standard (2007-06-25 14:38:05)   Paclitaxel injection What is this medicine? PACLITAXEL (PAK li TAX el) is a chemotherapy drug. It targets fast dividing cells, like cancer cells, and causes these cells to die. This medicine is used to treat ovarian cancer, breast cancer, and other cancers. This medicine may be used for other purposes; ask your health care provider or pharmacist if you have questions. COMMON BRAND NAME(S): Onxol, Taxol What should I tell my health care provider before I take this medicine? They need to know if you have any of these conditions: -blood disorders -irregular heartbeat -infection (especially a virus infection such as chickenpox, cold sores, or herpes) -  liver disease -previous or ongoing radiation therapy -an unusual or allergic reaction to paclitaxel, alcohol, polyoxyethylated castor oil, other chemotherapy agents, other medicines, foods, dyes, or preservatives -pregnant or trying to get pregnant -breast-feeding How should I use this medicine? This drug is given as an infusion into a vein. It is administered in a  hospital or clinic by a specially trained health care professional. Talk to your pediatrician regarding the use of this medicine in children. Special care may be needed. Overdosage: If you think you have taken too much of this medicine contact a poison control center or emergency room at once. NOTE: This medicine is only for you. Do not share this medicine with others. What if I miss a dose? It is important not to miss your dose. Call your doctor or health care professional if you are unable to keep an appointment. What may interact with this medicine? Do not take this medicine with any of the following medications: -disulfiram -metronidazole This medicine may also interact with the following medications: -cyclosporine -diazepam -ketoconazole -medicines to increase blood counts like filgrastim, pegfilgrastim, sargramostim -other chemotherapy drugs like cisplatin, doxorubicin, epirubicin, etoposide, teniposide, vincristine -quinidine -testosterone -vaccines -verapamil Talk to your doctor or health care professional before taking any of these medicines: -acetaminophen -aspirin -ibuprofen -ketoprofen -naproxen This list may not describe all possible interactions. Give your health care provider a list of all the medicines, herbs, non-prescription drugs, or dietary supplements you use. Also tell them if you smoke, drink alcohol, or use illegal drugs. Some items may interact with your medicine. What should I watch for while using this medicine? Your condition will be monitored carefully while you are receiving this medicine. You will need important blood work done while you are taking this medicine. This medicine can cause serious allergic reactions. To reduce your risk you will need to take other medicine(s) before treatment with this medicine. If you experience allergic reactions like skin rash, itching or hives, swelling of the face, lips, or tongue, tell your doctor or health care  professional right away. In some cases, you may be given additional medicines to help with side effects. Follow all directions for their use. This drug may make you feel generally unwell. This is not uncommon, as chemotherapy can affect healthy cells as well as cancer cells. Report any side effects. Continue your course of treatment even though you feel ill unless your doctor tells you to stop. Call your doctor or health care professional for advice if you get a fever, chills or sore throat, or other symptoms of a cold or flu. Do not treat yourself. This drug decreases your body's ability to fight infections. Try to avoid being around people who are sick. This medicine may increase your risk to bruise or bleed. Call your doctor or health care professional if you notice any unusual bleeding. Be careful brushing and flossing your teeth or using a toothpick because you may get an infection or bleed more easily. If you have any dental work done, tell your dentist you are receiving this medicine. Avoid taking products that contain aspirin, acetaminophen, ibuprofen, naproxen, or ketoprofen unless instructed by your doctor. These medicines may hide a fever. Do not become pregnant while taking this medicine. Women should inform their doctor if they wish to become pregnant or think they might be pregnant. There is a potential for serious side effects to an unborn child. Talk to your health care professional or pharmacist for more information. Do not breast-feed an infant while taking  this medicine. Men are advised not to father a child while receiving this medicine. This product may contain alcohol. Ask your pharmacist or healthcare provider if this medicine contains alcohol. Be sure to tell all healthcare providers you are taking this medicine. Certain medicines, like metronidazole and disulfiram, can cause an unpleasant reaction when taken with alcohol. The reaction includes flushing, headache, nausea, vomiting,  sweating, and increased thirst. The reaction can last from 30 minutes to several hours. What side effects may I notice from receiving this medicine? Side effects that you should report to your doctor or health care professional as soon as possible: -allergic reactions like skin rash, itching or hives, swelling of the face, lips, or tongue -low blood counts - This drug may decrease the number of white blood cells, red blood cells and platelets. You may be at increased risk for infections and bleeding. -signs of infection - fever or chills, cough, sore throat, pain or difficulty passing urine -signs of decreased platelets or bleeding - bruising, pinpoint red spots on the skin, black, tarry stools, nosebleeds -signs of decreased red blood cells - unusually weak or tired, fainting spells, lightheadedness -breathing problems -chest pain -high or low blood pressure -mouth sores -nausea and vomiting -pain, swelling, redness or irritation at the injection site -pain, tingling, numbness in the hands or feet -slow or irregular heartbeat -swelling of the ankle, feet, hands Side effects that usually do not require medical attention (report to your doctor or health care professional if they continue or are bothersome): -bone pain -complete hair loss including hair on your head, underarms, pubic hair, eyebrows, and eyelashes -changes in the color of fingernails -diarrhea -loosening of the fingernails -loss of appetite -muscle or joint pain -red flush to skin -sweating This list may not describe all possible side effects. Call your doctor for medical advice about side effects. You may report side effects to FDA at 1-800-FDA-1088. Where should I keep my medicine? This drug is given in a hospital or clinic and will not be stored at home. NOTE: This sheet is a summary. It may not cover all possible information. If you have questions about this medicine, talk to your doctor, pharmacist, or health care  provider.  2018 [employer]vier/Gold Standard (2015-01-19 19:58:00)

## 2016-10-02 NOTE — Progress Notes (Signed)
Per MD, OK to treat with Platelets of 89 and Cr of 1.6.  Wylene Simmer, BSN, RN 10/02/2016 10:16 AM

## 2016-10-03 ENCOUNTER — Ambulatory Visit
Admission: RE | Admit: 2016-10-03 | Discharge: 2016-10-03 | Disposition: A | Discharge status: Home / Self Care | Payer: 59 | Source: Ambulatory Visit | Attending: Radiation Oncology | Admitting: Radiation Oncology

## 2016-10-03 ENCOUNTER — Encounter (HOSPITAL_COMMUNITY): Payer: Self-pay

## 2016-10-03 DIAGNOSIS — R590 Localized enlarged lymph nodes: Secondary | ICD-10-CM | POA: Diagnosis not present

## 2016-10-03 DIAGNOSIS — Z95810 Presence of automatic (implantable) cardiac defibrillator: Secondary | ICD-10-CM | POA: Diagnosis not present

## 2016-10-03 DIAGNOSIS — I110 Hypertensive heart disease with heart failure: Secondary | ICD-10-CM | POA: Diagnosis not present

## 2016-10-03 DIAGNOSIS — Z510 Encounter for antineoplastic radiation therapy: Secondary | ICD-10-CM | POA: Diagnosis not present

## 2016-10-03 DIAGNOSIS — I4891 Unspecified atrial fibrillation: Secondary | ICD-10-CM | POA: Diagnosis not present

## 2016-10-03 DIAGNOSIS — C3411 Malignant neoplasm of upper lobe, right bronchus or lung: Secondary | ICD-10-CM | POA: Diagnosis not present

## 2016-10-05 ENCOUNTER — Ambulatory Visit
Admission: RE | Admit: 2016-10-05 | Discharge: 2016-10-05 | Disposition: A | Discharge status: Home / Self Care | Payer: 59 | Source: Ambulatory Visit | Attending: Radiation Oncology | Admitting: Radiation Oncology

## 2016-10-05 DIAGNOSIS — Z510 Encounter for antineoplastic radiation therapy: Secondary | ICD-10-CM | POA: Diagnosis not present

## 2016-10-05 DIAGNOSIS — Z95810 Presence of automatic (implantable) cardiac defibrillator: Secondary | ICD-10-CM | POA: Diagnosis not present

## 2016-10-05 DIAGNOSIS — C3411 Malignant neoplasm of upper lobe, right bronchus or lung: Secondary | ICD-10-CM | POA: Diagnosis not present

## 2016-10-05 DIAGNOSIS — I110 Hypertensive heart disease with heart failure: Secondary | ICD-10-CM | POA: Diagnosis not present

## 2016-10-05 DIAGNOSIS — I4891 Unspecified atrial fibrillation: Secondary | ICD-10-CM | POA: Diagnosis not present

## 2016-10-05 DIAGNOSIS — R590 Localized enlarged lymph nodes: Secondary | ICD-10-CM | POA: Diagnosis not present

## 2016-10-06 ENCOUNTER — Ambulatory Visit
Admission: RE | Admit: 2016-10-06 | Discharge: 2016-10-06 | Disposition: A | Discharge status: Home / Self Care | Payer: 59 | Source: Ambulatory Visit | Attending: Radiation Oncology | Admitting: Radiation Oncology

## 2016-10-06 DIAGNOSIS — Z510 Encounter for antineoplastic radiation therapy: Secondary | ICD-10-CM | POA: Diagnosis not present

## 2016-10-06 DIAGNOSIS — I4891 Unspecified atrial fibrillation: Secondary | ICD-10-CM | POA: Diagnosis not present

## 2016-10-06 DIAGNOSIS — C3411 Malignant neoplasm of upper lobe, right bronchus or lung: Secondary | ICD-10-CM | POA: Diagnosis not present

## 2016-10-06 DIAGNOSIS — Z95810 Presence of automatic (implantable) cardiac defibrillator: Secondary | ICD-10-CM | POA: Diagnosis not present

## 2016-10-06 DIAGNOSIS — I110 Hypertensive heart disease with heart failure: Secondary | ICD-10-CM | POA: Diagnosis not present

## 2016-10-06 DIAGNOSIS — R590 Localized enlarged lymph nodes: Secondary | ICD-10-CM | POA: Diagnosis not present

## 2016-10-09 ENCOUNTER — Ambulatory Visit (HOSPITAL_BASED_OUTPATIENT_CLINIC_OR_DEPARTMENT_OTHER): Payer: Medicare Other | Admitting: Internal Medicine

## 2016-10-09 ENCOUNTER — Ambulatory Visit (HOSPITAL_BASED_OUTPATIENT_CLINIC_OR_DEPARTMENT_OTHER): Payer: Medicare Other

## 2016-10-09 ENCOUNTER — Other Ambulatory Visit (HOSPITAL_BASED_OUTPATIENT_CLINIC_OR_DEPARTMENT_OTHER): Payer: Medicare Other

## 2016-10-09 ENCOUNTER — Ambulatory Visit
Admission: RE | Admit: 2016-10-09 | Discharge: 2016-10-09 | Disposition: A | Discharge status: Home / Self Care | Payer: 59 | Source: Ambulatory Visit | Attending: Radiation Oncology | Admitting: Radiation Oncology

## 2016-10-09 ENCOUNTER — Telehealth: Payer: Self-pay | Admitting: Internal Medicine

## 2016-10-09 ENCOUNTER — Encounter: Payer: Self-pay | Admitting: Internal Medicine

## 2016-10-09 ENCOUNTER — Ambulatory Visit: Payer: Medicare Other

## 2016-10-09 VITALS — BP 128/75 | HR 84 | Temp 98.2°F | Resp 20 | Ht 73.0 in | Wt 286.4 lb

## 2016-10-09 DIAGNOSIS — Z5111 Encounter for antineoplastic chemotherapy: Secondary | ICD-10-CM

## 2016-10-09 DIAGNOSIS — C3411 Malignant neoplasm of upper lobe, right bronchus or lung: Secondary | ICD-10-CM | POA: Diagnosis not present

## 2016-10-09 DIAGNOSIS — I110 Hypertensive heart disease with heart failure: Secondary | ICD-10-CM | POA: Diagnosis not present

## 2016-10-09 DIAGNOSIS — R1310 Dysphagia, unspecified: Secondary | ICD-10-CM | POA: Diagnosis not present

## 2016-10-09 DIAGNOSIS — R590 Localized enlarged lymph nodes: Secondary | ICD-10-CM | POA: Diagnosis not present

## 2016-10-09 DIAGNOSIS — Z510 Encounter for antineoplastic radiation therapy: Secondary | ICD-10-CM | POA: Diagnosis not present

## 2016-10-09 DIAGNOSIS — Z95810 Presence of automatic (implantable) cardiac defibrillator: Secondary | ICD-10-CM | POA: Diagnosis not present

## 2016-10-09 DIAGNOSIS — Z95828 Presence of other vascular implants and grafts: Secondary | ICD-10-CM

## 2016-10-09 DIAGNOSIS — I4891 Unspecified atrial fibrillation: Secondary | ICD-10-CM | POA: Diagnosis not present

## 2016-10-09 DIAGNOSIS — C3491 Malignant neoplasm of unspecified part of right bronchus or lung: Secondary | ICD-10-CM

## 2016-10-09 LAB — CBC WITH DIFFERENTIAL/PLATELET
BASO%: 0.5 % (ref 0.0–2.0)
BASOS ABS: 0 10*3/uL (ref 0.0–0.1)
EOS%: 2.6 % (ref 0.0–7.0)
Eosinophils Absolute: 0.1 10*3/uL (ref 0.0–0.5)
HEMATOCRIT: 33.8 % — AB (ref 38.4–49.9)
HEMOGLOBIN: 11 g/dL — AB (ref 13.0–17.1)
LYMPH#: 0.2 10*3/uL — AB (ref 0.9–3.3)
LYMPH%: 11.5 % — ABNORMAL LOW (ref 14.0–49.0)
MCH: 29.3 pg (ref 27.2–33.4)
MCHC: 32.5 g/dL (ref 32.0–36.0)
MCV: 90.1 fL (ref 79.3–98.0)
MONO#: 0.2 10*3/uL (ref 0.1–0.9)
MONO%: 9.4 % (ref 0.0–14.0)
NEUT#: 1.5 10*3/uL (ref 1.5–6.5)
NEUT%: 76 % — AB (ref 39.0–75.0)
Platelets: 106 10*3/uL — ABNORMAL LOW (ref 140–400)
RBC: 3.75 10*6/uL — ABNORMAL LOW (ref 4.20–5.82)
RDW: 15.9 % — ABNORMAL HIGH (ref 11.0–14.6)
WBC: 1.9 10*3/uL — ABNORMAL LOW (ref 4.0–10.3)
nRBC: 0 % (ref 0–0)

## 2016-10-09 LAB — COMPREHENSIVE METABOLIC PANEL
ALBUMIN: 3.2 g/dL — AB (ref 3.5–5.0)
ALK PHOS: 103 U/L (ref 40–150)
ALT: 13 U/L (ref 0–55)
ANION GAP: 9 meq/L (ref 3–11)
AST: 15 U/L (ref 5–34)
BUN: 25.7 mg/dL (ref 7.0–26.0)
CALCIUM: 9.6 mg/dL (ref 8.4–10.4)
CO2: 25 mEq/L (ref 22–29)
Chloride: 109 mEq/L (ref 98–109)
Creatinine: 1.5 mg/dL — ABNORMAL HIGH (ref 0.7–1.3)
EGFR: 55 mL/min/{1.73_m2} — AB (ref >90)
Glucose: 104 mg/dl (ref 70–140)
POTASSIUM: 4.1 meq/L (ref 3.5–5.1)
Sodium: 143 mEq/L (ref 136–145)
Total Bilirubin: 0.44 mg/dL (ref 0.20–1.20)
Total Protein: 6.1 g/dL — ABNORMAL LOW (ref 6.4–8.3)

## 2016-10-09 MED ORDER — PALONOSETRON HCL INJECTION 0.25 MG/5ML
0.2500 mg | Freq: Once | INTRAVENOUS | Status: AC
  Administered 2016-10-09: 0.25 mg via INTRAVENOUS

## 2016-10-09 MED ORDER — DEXAMETHASONE SODIUM PHOSPHATE 10 MG/ML IJ SOLN
INTRAMUSCULAR | Status: AC
  Filled 2016-10-09: qty 1

## 2016-10-09 MED ORDER — SODIUM CHLORIDE 0.9% FLUSH
10.0000 mL | Freq: Once | INTRAVENOUS | Status: AC
  Administered 2016-10-09: 10 mL
  Filled 2016-10-09: qty 10

## 2016-10-09 MED ORDER — HEPARIN SOD (PORK) LOCK FLUSH 100 UNIT/ML IV SOLN
500.0000 [IU] | Freq: Once | INTRAVENOUS | Status: AC | PRN
  Administered 2016-10-09: 500 [IU]
  Filled 2016-10-09: qty 5

## 2016-10-09 MED ORDER — PACLITAXEL CHEMO INJECTION 300 MG/50ML
45.0000 mg/m2 | Freq: Once | INTRAVENOUS | Status: AC
  Administered 2016-10-09: 120 mg via INTRAVENOUS
  Filled 2016-10-09: qty 20

## 2016-10-09 MED ORDER — DIPHENHYDRAMINE HCL 50 MG/ML IJ SOLN
50.0000 mg | Freq: Once | INTRAMUSCULAR | Status: AC
  Administered 2016-10-09: 50 mg via INTRAVENOUS

## 2016-10-09 MED ORDER — SODIUM CHLORIDE 0.9 % IV SOLN
Freq: Once | INTRAVENOUS | Status: AC
  Administered 2016-10-09: 11:00:00 via INTRAVENOUS

## 2016-10-09 MED ORDER — FAMOTIDINE IN NACL 20-0.9 MG/50ML-% IV SOLN
20.0000 mg | Freq: Once | INTRAVENOUS | Status: AC
  Administered 2016-10-09: 20 mg via INTRAVENOUS

## 2016-10-09 MED ORDER — SODIUM CHLORIDE 0.9 % IV SOLN
20.0000 mg | Freq: Once | INTRAVENOUS | Status: AC
  Administered 2016-10-09: 20 mg via INTRAVENOUS
  Filled 2016-10-09: qty 2

## 2016-10-09 MED ORDER — PALONOSETRON HCL INJECTION 0.25 MG/5ML
INTRAVENOUS | Status: AC
  Filled 2016-10-09: qty 5

## 2016-10-09 MED ORDER — SODIUM CHLORIDE 0.9% FLUSH
10.0000 mL | INTRAVENOUS | Status: DC | PRN
  Administered 2016-10-09: 10 mL
  Filled 2016-10-09: qty 10

## 2016-10-09 MED ORDER — SODIUM CHLORIDE 0.9 % IV SOLN
222.6000 mg | Freq: Once | INTRAVENOUS | Status: AC
  Administered 2016-10-09: 220 mg via INTRAVENOUS
  Filled 2016-10-09: qty 22

## 2016-10-09 MED ORDER — FAMOTIDINE IN NACL 20-0.9 MG/50ML-% IV SOLN
INTRAVENOUS | Status: AC
  Filled 2016-10-09: qty 50

## 2016-10-09 MED ORDER — DIPHENHYDRAMINE HCL 50 MG/ML IJ SOLN
INTRAMUSCULAR | Status: AC
  Filled 2016-10-09: qty 1

## 2016-10-09 NOTE — Patient Instructions (Signed)
[facility] Discharge Instructions for Patients Receiving Chemotherapy  Today you received the following chemotherapy agents Taxol/Carboplatin  To help prevent nausea and vomiting after your treatment, we encourage you to take your nausea medication    If you develop nausea and vomiting that is not controlled by your nausea medication, call the clinic.   BELOW ARE SYMPTOMS THAT SHOULD BE REPORTED IMMEDIATELY:  *FEVER GREATER THAN 100.5 F  *CHILLS WITH OR WITHOUT FEVER  NAUSEA AND VOMITING THAT IS NOT CONTROLLED WITH YOUR NAUSEA MEDICATION  *UNUSUAL SHORTNESS OF BREATH  *UNUSUAL BRUISING OR BLEEDING  TENDERNESS IN MOUTH AND THROAT WITH OR WITHOUT PRESENCE OF ULCERS  *URINARY PROBLEMS  *BOWEL PROBLEMS  UNUSUAL RASH Items with * indicate a potential emergency and should be followed up as soon as possible.  Feel free to call the clinic you have any questions or concerns. The clinic phone number is [phone].  Please show the CHEMO ALERT CARD at check-in to the Emergency Department and triage nurse.   

## 2016-10-09 NOTE — Telephone Encounter (Signed)
Scheduled appt per 7/9 los - Gave patient AVS and calender per los.

## 2016-10-09 NOTE — Progress Notes (Addendum)
[place] [phone]   [phone]  OFFICE PROGRESS NOTE  Lucianne Lei, MD [address] [place] [address]  DIAGNOSIS: Stage IIIA (T1a, N2, M0) non-small cell lung cancer, adenocarcinoma presented with right upper lobe lung nodule in addition to mediastinal lymphadenopathy diagnosed in March 2018.  Biomarker Findings Tumor Mutational Burden - TMB-Intermediate (8 Muts/Mb) Microsatellite Status - MS-Stable Genomic Findings For a complete list of the genes assayed, please refer to the Appendix. ERBB2 amplification - equivocal? DNMT3A K479f*192 FUBP1 Q40* KEAP1 [id number] TP53 C275F 7 Disease relevant genes with no reportable alterations: EGFR, KRAS, ALK, BRAF, MET, RET, ROS1   PRIOR THERAPY: None.  CURRENT THERAPY: course of concurrent chemoradiation with weekly carboplatin for AUC of 2 and paclitaxel 45 MG/M2. Status post 4 cycles.  INTERVAL HISTORY: Jason Bunda66.o. male returns to the clinic today for follow-up visit. The patient is feeling fine today with no specific complaints. He continues to tolerate his treatment with concurrent chemoradiation fairly well except for odynophagia. He is currently on Carafate and feeling much better. He denied having any chest pain, shortness of breath, cough or hemoptysis. He denied having any fever or chills. He has no nausea, vomiting, diarrhea or constipation. He lost few pounds since his last visit. The patient is here today for evaluation before starting cycle #5.  MEDICAL HISTORY: Past Medical History:  Diagnosis Date  . Adenocarcinoma of right lung, stage 3 ([facility]2018  . Adenocarcinoma of right lung, stage 3 ([facility]/2018  . Anemia   . Arthritis    "hx right hip"  . Asthma    "when I was a child"  . Atrial fibrillation (HCC)    Amiodarone started 10/2011; Coumadin  . 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 St. Jude ICD implantation  10/03/12.  . Chronic anticoagulation   . Chronic systolic heart failure ([facility]   a. Echo 7/13: EF 25%;  b. echo 04/2012:  Mild LVH, EF 30-35%, Gr 1 DD, mild AI, mild MR, mild LAE  . COPD (chronic obstructive pulmonary disease) ([facility]  . Dyslipidemia   . Dysrhythmia   . Encounter for antineoplastic chemotherapy 08/24/2016  . Gout   . History of blood transfusion 10/15/2013   "don't know where the blood's going; HgB down to 5"  . Hyperlipidemia   . Hypertension   . Hypothyroidism   . NICM (nonischemic cardiomyopathy) ([facility]   [facility]/14:  minimal CAD  . Obesity   . OSA on CPAP   . Tobacco abuse     ALLERGIES:  is allergic to no known allergies.  MEDICATIONS:  Current Outpatient Prescriptions  Medication Sig Dispense Refill  . acetaminophen (TYLENOL) 500 MG tablet Take 1,000 mg by mouth every 6 (six) hours as needed for mild pain.    . Marland Kitchenmiodarone (PACERONE) 100 MG tablet Take 1 tablet (100 mg total) by mouth daily. 90 tablet 2  . atorvastatin (LIPITOR) 20 MG tablet TAKE 1 TABLET BY MOUTH EVERY DAY 90 tablet 3  . buPROPion (WELLBUTRIN SR) 150 MG 12 hr tablet TAKE 1 TABLET (150 MG TOTAL) BY MOUTH 2 (TWO) TIMES DAILY. (Patient taking differently: Take 150 mg by mouth 2 (two) times daily. ) 180 tablet 2  . carvedilol (COREG) 12.5 MG tablet TAKE 1 TABLET (12.5 MG TOTAL) BY MOUTH 2 (TWO) TIMES DAILY. 180 tablet 3  . colchicine 0.6 MG tablet Take 1 tablet by mouth as needed (gout  flares).     . ferrous gluconate (FERGON) 324 MG tablet Take 1 tablet (324 mg total) by mouth 3 (three) times daily with meals. 90 tablet 8  . furosemide (LASIX) 20 MG tablet Take 3 tabs (60 mg) every Tue and Sat, all other days take 2 tabs (40 mg) (Patient taking differently: Take 40-60 mg by mouth See admin instructions. Take 3 tabs (60 mg) every Tue and Sat, all other days take 2 tabs (40 mg)) 64 tablet 6  . hydrALAZINE (APRESOLINE) 100 MG tablet Take 1 tablet by mouth 3 (three) times daily.  1  . levothyroxine  (SYNTHROID, LEVOTHROID) 50 MCG tablet TAKE 1 TABLET (50 MCG TOTAL) BY MOUTH DAILY BEFORE BREAKFAST. 90 tablet 3  . lidocaine-prilocaine (EMLA) cream Apply generous amount to port site at least 1 hr prior to treatment.  DO NOT RUB IN. 30 g 0  . lisinopril (PRINIVIL,ZESTRIL) 40 MG tablet TAKE 1 TABLET BY MOUTH DAILY. 90 tablet 2  . Polyvinyl Alcohol-Povidone (REFRESH OP) Apply 1 drop to eye daily.    . potassium chloride SA (KLOR-CON M20) 20 MEQ tablet Take 1 tablet every day.  On days when lasix is taken, take 2 tablets (Patient taking differently: Take 20 mEq by mouth See admin instructions. Take 1 tablet every day.  On days when lasix is taken, take 2 tablets) 135 tablet 3  . rivaroxaban (XARELTO) 20 MG TABS tablet Take 1 tablet (20 mg total) by mouth daily. 90 tablet 2  . sildenafil (REVATIO) 20 MG tablet Take 1 tablet (20 mg total) by mouth at bedtime. Take 2-3 tablets daily prn for ED 30 tablet 2  . spironolactone (ALDACTONE) 25 MG tablet TAKE 0.5 TABLETS (12.5 MG TOTAL) BY MOUTH DAILY. 45 tablet 2  . sucralfate (CARAFATE) 1 g tablet Take 1 tablet (1 g total) by mouth 4 (four) times daily -  with meals and at bedtime. 5 min before meals for radiation induced esophagitis 120 tablet 2  . ULORIC 40 MG tablet Take 1 tablet by mouth daily.    . Vitamin D, Ergocalciferol, (DRISDOL) 50000 units CAPS capsule Take 1 capsule by mouth once a week. Monday    . albuterol (PROVENTIL HFA;VENTOLIN HFA) 108 (90 Base) MCG/ACT inhaler Inhale 2 puffs into the lungs every 4 (four) hours as needed for wheezing or shortness of breath. (Patient not taking: Reported on 09/25/2016) 1 Inhaler 2  . prochlorperazine (COMPAZINE) 10 MG tablet Take 1 tablet (10 mg total) by mouth every 6 (six) hours as needed for nausea or vomiting. (Patient not taking: Reported on 09/11/2016) 30 tablet 0  . Wound Cleansers (RADIAPLEX EX) Apply topically.     No current facility-administered medications for this visit.     SURGICAL HISTORY:    Past Surgical History:  Procedure Laterality Date  . CARDIAC DEFIBRILLATOR PLACEMENT  2014  . CARDIOVERSION  2011  . COLONOSCOPY WITH PROPOFOL Left 10/17/2013   Procedure: COLONOSCOPY WITH PROPOFOL;  Surgeon: Inda Castle, MD;  Location: [facility];  Service: Endoscopy;  Laterality: Left;  . ESOPHAGOGASTRODUODENOSCOPY N/A 10/17/2013   Procedure: ESOPHAGOGASTRODUODENOSCOPY (EGD);  Surgeon: Inda Castle, MD;  Location: [facility];  Service: Endoscopy;  Laterality: N/A;  . GIVENS CAPSULE STUDY N/A 10/29/2013   Procedure: GIVENS CAPSULE STUDY;  Surgeon: Inda Castle, MD;  Location: [facility];  Service: Endoscopy;  Laterality: N/A;  . IMPLANTABLE CARDIOVERTER DEFIBRILLATOR IMPLANT Left 10/03/2012   Procedure: IMPLANTABLE CARDIOVERTER DEFIBRILLATOR IMPLANT;  Surgeon: Deboraha Sprang, MD;  Location[facility]  CATH LAB;  Service: Cardiovascular;  Laterality: Left;  . IR FLUORO GUIDE PORT INSERTION RIGHT  09/21/2016  . IR US GUIDE VASC ACCESS RIGHT  09/21/2016  . JOINT REPLACEMENT    . TONSILLECTOMY  1950's  . TOTAL HIP ARTHROPLASTY Right 11/25/1997  . VIDEO BRONCHOSCOPY WITH ENDOBRONCHIAL ULTRASOUND N/A 08/09/2016   Procedure: VIDEO BRONCHOSCOPY WITH ENDOBRONCHIAL ULTRASOUND;  Surgeon: Javier Glazier, MD;  Location: [facility];  Service: Thoracic;  Laterality: N/A;    REVIEW OF SYSTEMS:  A comprehensive review of systems was negative except for: Gastrointestinal: positive for odynophagia   PHYSICAL EXAMINATION: General appearance: alert, cooperative, fatigued and no distress Head: Normocephalic, without obvious abnormality, atraumatic Neck: no adenopathy, no JVD, supple, symmetrical, trachea midline and thyroid not enlarged, symmetric, no tenderness/mass/nodules Lymph nodes: Cervical, supraclavicular, and axillary nodes normal. Resp: clear to auscultation bilaterally Back: symmetric, no curvature. ROM normal. No CVA tenderness. Cardio: regular rate and rhythm, S1, S2 normal, no murmur, click, rub  or gallop GI: soft, non-tender; bowel sounds normal; no masses,  no organomegaly Extremities: extremities normal, atraumatic, no cyanosis or edema  ECOG PERFORMANCE STATUS: 1 - Symptomatic but completely ambulatory  Blood pressure 128/75, pulse 84, temperature 98.2 F (36.8 C), temperature source Oral, resp. rate 20, height _0  (1.854 m), weight 286 lb 6.4 oz (129.9 kg), SpO2 100 %.  LABORATORY DATA: Lab Results  Component Value Date   WBC 1.9 (L) 10/09/2016   HGB 11.0 (L) 10/09/2016   HCT 33.8 (L) 10/09/2016   MCV 90.1 10/09/2016   PLT 106 (L) 10/09/2016      Chemistry      Component Value Date/Time   NA 143 10/09/2016 0801   K 4.1 10/09/2016 0801   CL 106 09/21/2016 1209   CO2 25 10/09/2016 0801   BUN 25.7 10/09/2016 0801   CREATININE 1.5 (H) 10/09/2016 0801      Component Value Date/Time   CALCIUM 9.6 10/09/2016 0801   ALKPHOS 103 10/09/2016 0801   AST 15 10/09/2016 0801   ALT 13 10/09/2016 0801   BILITOT 0.44 10/09/2016 0801       RADIOGRAPHIC STUDIES: Ir US Guide Vasc Access Right  Result Date: 09/21/2016 CLINICAL DATA:  Lung carcinoma, needs durable venous access for chemotherapy regimen. EXAM: TUNNELED PORT CATHETER PLACEMENT WITH ULTRASOUND AND FLUOROSCOPIC GUIDANCE FLUOROSCOPY TIME:  0.1 minute, 65  uGym2 DAP ANESTHESIA/SEDATION: Intravenous Fentanyl and Versed were administered as conscious sedation during continuous monitoring of the patient's level of consciousness and physiological / cardiorespiratory status by the radiology RN, with a total moderate sedation time of 13 minutes. TECHNIQUE: The procedure, risks, benefits, and alternatives were explained to the patient. Questions regarding the procedure were encouraged and answered. The patient understands and consents to the procedure. As antibiotic prophylaxis, cefazolin 2 g was ordered pre-procedure and administered intravenously within one hour of incision. Patency of the right IJ vein was confirmed with  ultrasound with image documentation. An appropriate skin site was determined. Skin site was marked. Region was prepped using maximum barrier technique including cap and mask, sterile gown, sterile gloves, large sterile sheet, and Chlorhexidine as cutaneous antisepsis. The region was infiltrated locally with 1% lidocaine. Under real-time ultrasound guidance, the right IJ vein was accessed with a 21 gauge micropuncture needle; the needle tip within the vein was confirmed with ultrasound image documentation. Needle was exchanged over a 018 guidewire for transitional dilator which allowed passage of the [facility] wire into the IVC. Over this, the transitional dilator was exchanged for  a 5 [place] MPA catheter. A small incision was made on the right anterior chest wall and a subcutaneous pocket fashioned. The power-injectable port was positioned and its catheter tunneled to the right IJ dermatotomy site. The MPA catheter was exchanged over an Amplatz wire for a peel-away sheath, through which the port catheter, which had been trimmed to the appropriate length, was advanced and positioned under fluoroscopy with its tip at the cavoatrial junction. Spot chest radiograph confirms good catheter position and no pneumothorax. The pocket was closed with deep interrupted and subcuticular continuous 3-0 Monocryl sutures. The port was flushed per protocol. The incisions were covered with Dermabond then covered with a sterile dressing. COMPLICATIONS: COMPLICATIONS None immediate IMPRESSION: Technically successful right IJ power-injectable port catheter placement. Ready for routine use. Electronically Signed   By: Lucrezia Europe M.D.   On: 09/21/2016 14:42   Ir Fluoro Guide Port Insertion Right  Result Date: 09/21/2016 CLINICAL DATA:  Lung carcinoma, needs durable venous access for chemotherapy regimen. EXAM: TUNNELED PORT CATHETER PLACEMENT WITH ULTRASOUND AND FLUOROSCOPIC GUIDANCE FLUOROSCOPY TIME:  0.1 minute, 65  uGym2 DAP  ANESTHESIA/SEDATION: Intravenous Fentanyl and Versed were administered as conscious sedation during continuous monitoring of the patient's level of consciousness and physiological / cardiorespiratory status by the radiology RN, with a total moderate sedation time of 13 minutes. TECHNIQUE: The procedure, risks, benefits, and alternatives were explained to the patient. Questions regarding the procedure were encouraged and answered. The patient understands and consents to the procedure. As antibiotic prophylaxis, cefazolin 2 g was ordered pre-procedure and administered intravenously within one hour of incision. Patency of the right IJ vein was confirmed with ultrasound with image documentation. An appropriate skin site was determined. Skin site was marked. Region was prepped using maximum barrier technique including cap and mask, sterile gown, sterile gloves, large sterile sheet, and Chlorhexidine as cutaneous antisepsis. The region was infiltrated locally with 1% lidocaine. Under real-time ultrasound guidance, the right IJ vein was accessed with a 21 gauge micropuncture needle; the needle tip within the vein was confirmed with ultrasound image documentation. Needle was exchanged over a 018 guidewire for transitional dilator which allowed passage of the [facility] wire into the IVC. Over this, the transitional dilator was exchanged for a 5 [place] MPA catheter. A small incision was made on the right anterior chest wall and a subcutaneous pocket fashioned. The power-injectable port was positioned and its catheter tunneled to the right IJ dermatotomy site. The MPA catheter was exchanged over an Amplatz wire for a peel-away sheath, through which the port catheter, which had been trimmed to the appropriate length, was advanced and positioned under fluoroscopy with its tip at the cavoatrial junction. Spot chest radiograph confirms good catheter position and no pneumothorax. The pocket was closed with deep interrupted and  subcuticular continuous 3-0 Monocryl sutures. The port was flushed per protocol. The incisions were covered with Dermabond then covered with a sterile dressing. COMPLICATIONS: COMPLICATIONS None immediate IMPRESSION: Technically successful right IJ power-injectable port catheter placement. Ready for routine use. Electronically Signed   By: Lucrezia Europe M.D.   On: 09/21/2016 14:42    ASSESSMENT AND PLAN:  This is a very pleasant 66 years old African-American male with a stage IIIa non-small cell lung cancer, adenocarcinoma. The patient is currently undergoing treatment with concurrent chemoradiation with weekly carboplatin and paclitaxel, status post 4 cycles and has been tolerating this treatment fairly well. I recommended for the patient to proceed with cycle #5 today as a scheduled. I would see  him back for follow-up visit in 5 weeks for evaluation after repeating CT scan of the chest for restaging of his disease. For odynophagia, he will continue his current treatment with Carafate and pain medication. The patient was advised to call immediately if he has any concerning symptoms in the interval. The patient voices understanding of current disease status and treatment options and is in agreement with the current care plan. All questions were answered. The patient knows to call the clinic with any problems, questions or concerns. We can certainly see the patient much sooner if necessary. I spent 10 minutes counseling the patient face to face. The total time spent in the appointment was 15 minutes.  Disclaimer: This note was dictated with voice recognition software. Similar sounding words can inadvertently be transcribed and may not be corrected upon review.

## 2016-10-10 ENCOUNTER — Ambulatory Visit
Admission: RE | Admit: 2016-10-10 | Discharge: 2016-10-10 | Disposition: A | Discharge status: Home / Self Care | Payer: 59 | Source: Ambulatory Visit | Attending: Radiation Oncology | Admitting: Radiation Oncology

## 2016-10-10 DIAGNOSIS — I4891 Unspecified atrial fibrillation: Secondary | ICD-10-CM | POA: Diagnosis not present

## 2016-10-10 DIAGNOSIS — C3411 Malignant neoplasm of upper lobe, right bronchus or lung: Secondary | ICD-10-CM | POA: Diagnosis not present

## 2016-10-10 DIAGNOSIS — Z510 Encounter for antineoplastic radiation therapy: Secondary | ICD-10-CM | POA: Diagnosis not present

## 2016-10-10 DIAGNOSIS — Z95810 Presence of automatic (implantable) cardiac defibrillator: Secondary | ICD-10-CM | POA: Diagnosis not present

## 2016-10-10 DIAGNOSIS — R590 Localized enlarged lymph nodes: Secondary | ICD-10-CM | POA: Diagnosis not present

## 2016-10-10 DIAGNOSIS — I110 Hypertensive heart disease with heart failure: Secondary | ICD-10-CM | POA: Diagnosis not present

## 2016-10-11 ENCOUNTER — Ambulatory Visit
Admission: RE | Admit: 2016-10-11 | Discharge: 2016-10-11 | Disposition: A | Discharge status: Home / Self Care | Payer: 59 | Source: Ambulatory Visit | Attending: Radiation Oncology | Admitting: Radiation Oncology

## 2016-10-11 DIAGNOSIS — Z510 Encounter for antineoplastic radiation therapy: Secondary | ICD-10-CM | POA: Diagnosis not present

## 2016-10-11 DIAGNOSIS — C3411 Malignant neoplasm of upper lobe, right bronchus or lung: Secondary | ICD-10-CM | POA: Diagnosis not present

## 2016-10-11 DIAGNOSIS — I110 Hypertensive heart disease with heart failure: Secondary | ICD-10-CM | POA: Diagnosis not present

## 2016-10-11 DIAGNOSIS — I4891 Unspecified atrial fibrillation: Secondary | ICD-10-CM | POA: Diagnosis not present

## 2016-10-11 DIAGNOSIS — R590 Localized enlarged lymph nodes: Secondary | ICD-10-CM | POA: Diagnosis not present

## 2016-10-11 DIAGNOSIS — Z95810 Presence of automatic (implantable) cardiac defibrillator: Secondary | ICD-10-CM | POA: Diagnosis not present

## 2016-10-12 ENCOUNTER — Ambulatory Visit
Admission: RE | Admit: 2016-10-12 | Discharge: 2016-10-12 | Disposition: A | Discharge status: Home / Self Care | Payer: 59 | Source: Ambulatory Visit | Attending: Radiation Oncology | Admitting: Radiation Oncology

## 2016-10-12 DIAGNOSIS — I110 Hypertensive heart disease with heart failure: Secondary | ICD-10-CM | POA: Diagnosis not present

## 2016-10-12 DIAGNOSIS — I4891 Unspecified atrial fibrillation: Secondary | ICD-10-CM | POA: Diagnosis not present

## 2016-10-12 DIAGNOSIS — Z95810 Presence of automatic (implantable) cardiac defibrillator: Secondary | ICD-10-CM | POA: Diagnosis not present

## 2016-10-12 DIAGNOSIS — Z510 Encounter for antineoplastic radiation therapy: Secondary | ICD-10-CM | POA: Diagnosis not present

## 2016-10-12 DIAGNOSIS — R590 Localized enlarged lymph nodes: Secondary | ICD-10-CM | POA: Diagnosis not present

## 2016-10-12 DIAGNOSIS — C3411 Malignant neoplasm of upper lobe, right bronchus or lung: Secondary | ICD-10-CM | POA: Diagnosis not present

## 2016-10-13 ENCOUNTER — Ambulatory Visit
Admission: RE | Admit: 2016-10-13 | Discharge: 2016-10-13 | Disposition: A | Discharge status: Home / Self Care | Payer: 59 | Source: Ambulatory Visit | Attending: Radiation Oncology | Admitting: Radiation Oncology

## 2016-10-13 DIAGNOSIS — I4891 Unspecified atrial fibrillation: Secondary | ICD-10-CM | POA: Diagnosis not present

## 2016-10-13 DIAGNOSIS — R590 Localized enlarged lymph nodes: Secondary | ICD-10-CM | POA: Diagnosis not present

## 2016-10-13 DIAGNOSIS — Z510 Encounter for antineoplastic radiation therapy: Secondary | ICD-10-CM | POA: Diagnosis not present

## 2016-10-13 DIAGNOSIS — I110 Hypertensive heart disease with heart failure: Secondary | ICD-10-CM | POA: Diagnosis not present

## 2016-10-13 DIAGNOSIS — C3411 Malignant neoplasm of upper lobe, right bronchus or lung: Secondary | ICD-10-CM | POA: Diagnosis not present

## 2016-10-13 DIAGNOSIS — Z95810 Presence of automatic (implantable) cardiac defibrillator: Secondary | ICD-10-CM | POA: Diagnosis not present

## 2016-10-16 ENCOUNTER — Ambulatory Visit
Admission: RE | Admit: 2016-10-16 | Discharge: 2016-10-16 | Disposition: A | Discharge status: Home / Self Care | Payer: 59 | Source: Ambulatory Visit | Attending: Radiation Oncology | Admitting: Radiation Oncology

## 2016-10-16 ENCOUNTER — Other Ambulatory Visit (HOSPITAL_BASED_OUTPATIENT_CLINIC_OR_DEPARTMENT_OTHER): Payer: Medicare Other

## 2016-10-16 ENCOUNTER — Ambulatory Visit (HOSPITAL_BASED_OUTPATIENT_CLINIC_OR_DEPARTMENT_OTHER): Payer: Medicare Other

## 2016-10-16 VITALS — BP 115/71 | HR 86 | Temp 98.4°F | Resp 18

## 2016-10-16 DIAGNOSIS — Z5111 Encounter for antineoplastic chemotherapy: Secondary | ICD-10-CM

## 2016-10-16 DIAGNOSIS — R590 Localized enlarged lymph nodes: Secondary | ICD-10-CM | POA: Diagnosis not present

## 2016-10-16 DIAGNOSIS — C3491 Malignant neoplasm of unspecified part of right bronchus or lung: Secondary | ICD-10-CM

## 2016-10-16 DIAGNOSIS — I4891 Unspecified atrial fibrillation: Secondary | ICD-10-CM | POA: Diagnosis not present

## 2016-10-16 DIAGNOSIS — Z510 Encounter for antineoplastic radiation therapy: Secondary | ICD-10-CM | POA: Diagnosis not present

## 2016-10-16 DIAGNOSIS — Z95810 Presence of automatic (implantable) cardiac defibrillator: Secondary | ICD-10-CM | POA: Diagnosis not present

## 2016-10-16 DIAGNOSIS — C3411 Malignant neoplasm of upper lobe, right bronchus or lung: Secondary | ICD-10-CM | POA: Diagnosis present

## 2016-10-16 DIAGNOSIS — I110 Hypertensive heart disease with heart failure: Secondary | ICD-10-CM | POA: Diagnosis not present

## 2016-10-16 LAB — CBC WITH DIFFERENTIAL/PLATELET
BASO%: 0.9 % (ref 0.0–2.0)
BASOS ABS: 0 10*3/uL (ref 0.0–0.1)
EOS ABS: 0 10*3/uL (ref 0.0–0.5)
EOS%: 0.9 % (ref 0.0–7.0)
HEMATOCRIT: 34.5 % — AB (ref 38.4–49.9)
HEMOGLOBIN: 11.3 g/dL — AB (ref 13.0–17.1)
LYMPH#: 0.3 10*3/uL — AB (ref 0.9–3.3)
LYMPH%: 14 % (ref 14.0–49.0)
MCH: 29.4 pg (ref 27.2–33.4)
MCHC: 32.8 g/dL (ref 32.0–36.0)
MCV: 89.8 fL (ref 79.3–98.0)
MONO#: 0.2 10*3/uL (ref 0.1–0.9)
MONO%: 7.9 % (ref 0.0–14.0)
NEUT#: 1.6 10*3/uL (ref 1.5–6.5)
NEUT%: 76.3 % — AB (ref 39.0–75.0)
PLATELETS: 155 10*3/uL (ref 140–400)
RBC: 3.84 10*6/uL — ABNORMAL LOW (ref 4.20–5.82)
RDW: 16.9 % — AB (ref 11.0–14.6)
WBC: 2.2 10*3/uL — ABNORMAL LOW (ref 4.0–10.3)

## 2016-10-16 LAB — COMPREHENSIVE METABOLIC PANEL
ALBUMIN: 3.3 g/dL — AB (ref 3.5–5.0)
ALK PHOS: 98 U/L (ref 40–150)
ALT: 12 U/L (ref 0–55)
ANION GAP: 8 meq/L (ref 3–11)
AST: 14 U/L (ref 5–34)
BUN: 28.4 mg/dL — AB (ref 7.0–26.0)
CALCIUM: 9.4 mg/dL (ref 8.4–10.4)
CO2: 26 mEq/L (ref 22–29)
Chloride: 107 mEq/L (ref 98–109)
Creatinine: 2 mg/dL — ABNORMAL HIGH (ref 0.7–1.3)
EGFR: 40 mL/min/{1.73_m2} — AB (ref >90)
Glucose: 116 mg/dl (ref 70–140)
POTASSIUM: 4.1 meq/L (ref 3.5–5.1)
Sodium: 141 mEq/L (ref 136–145)
Total Bilirubin: 0.4 mg/dL (ref 0.20–1.20)
Total Protein: 6.2 g/dL — ABNORMAL LOW (ref 6.4–8.3)

## 2016-10-16 MED ORDER — DIPHENHYDRAMINE HCL 50 MG/ML IJ SOLN
50.0000 mg | Freq: Once | INTRAMUSCULAR | Status: AC
  Administered 2016-10-16: 50 mg via INTRAVENOUS

## 2016-10-16 MED ORDER — CARBOPLATIN CHEMO INJECTION 450 MG/45ML
190.0000 mg | Freq: Once | INTRAVENOUS | Status: AC
  Administered 2016-10-16: 190 mg via INTRAVENOUS
  Filled 2016-10-16: qty 19

## 2016-10-16 MED ORDER — SODIUM CHLORIDE 0.9 % IV SOLN
20.0000 mg | Freq: Once | INTRAVENOUS | Status: AC
  Administered 2016-10-16: 20 mg via INTRAVENOUS
  Filled 2016-10-16: qty 2

## 2016-10-16 MED ORDER — SODIUM CHLORIDE 0.9 % IV SOLN
Freq: Once | INTRAVENOUS | Status: AC
  Administered 2016-10-16: 09:00:00 via INTRAVENOUS

## 2016-10-16 MED ORDER — PACLITAXEL CHEMO INJECTION 300 MG/50ML
45.0000 mg/m2 | Freq: Once | INTRAVENOUS | Status: AC
  Administered 2016-10-16: 120 mg via INTRAVENOUS
  Filled 2016-10-16: qty 20

## 2016-10-16 MED ORDER — PALONOSETRON HCL INJECTION 0.25 MG/5ML
INTRAVENOUS | Status: AC
  Filled 2016-10-16: qty 5

## 2016-10-16 MED ORDER — FAMOTIDINE IN NACL 20-0.9 MG/50ML-% IV SOLN
20.0000 mg | Freq: Once | INTRAVENOUS | Status: AC
  Administered 2016-10-16: 20 mg via INTRAVENOUS

## 2016-10-16 MED ORDER — FAMOTIDINE IN NACL 20-0.9 MG/50ML-% IV SOLN
INTRAVENOUS | Status: AC
  Filled 2016-10-16: qty 50

## 2016-10-16 MED ORDER — SODIUM CHLORIDE 0.9% FLUSH
10.0000 mL | INTRAVENOUS | Status: DC | PRN
  Administered 2016-10-16: 10 mL
  Filled 2016-10-16: qty 10

## 2016-10-16 MED ORDER — HEPARIN SOD (PORK) LOCK FLUSH 100 UNIT/ML IV SOLN
500.0000 [IU] | Freq: Once | INTRAVENOUS | Status: AC | PRN
  Administered 2016-10-16: 500 [IU]
  Filled 2016-10-16: qty 5

## 2016-10-16 MED ORDER — PALONOSETRON HCL INJECTION 0.25 MG/5ML
0.2500 mg | Freq: Once | INTRAVENOUS | Status: AC
  Administered 2016-10-16: 0.25 mg via INTRAVENOUS

## 2016-10-16 MED ORDER — DIPHENHYDRAMINE HCL 50 MG/ML IJ SOLN
INTRAMUSCULAR | Status: AC
  Filled 2016-10-16: qty 1

## 2016-10-16 NOTE — Patient Instructions (Signed)
[facility] Discharge Instructions for Patients Receiving Chemotherapy  Today you received the following chemotherapy agents Taxol and Carboplatin. To help prevent nausea and vomiting after your treatment, we encourage you to take your nausea medication as directed.  If you develop nausea and vomiting that is not controlled by your nausea medication, call the clinic.   BELOW ARE SYMPTOMS THAT SHOULD BE REPORTED IMMEDIATELY:  *FEVER GREATER THAN 100.5 F  *CHILLS WITH OR WITHOUT FEVER  NAUSEA AND VOMITING THAT IS NOT CONTROLLED WITH YOUR NAUSEA MEDICATION  *UNUSUAL SHORTNESS OF BREATH  *UNUSUAL BRUISING OR BLEEDING  TENDERNESS IN MOUTH AND THROAT WITH OR WITHOUT PRESENCE OF ULCERS  *URINARY PROBLEMS  *BOWEL PROBLEMS  UNUSUAL RASH Items with * indicate a potential emergency and should be followed up as soon as possible.  Feel free to call the clinic you have any questions or concerns. The clinic phone number is [phone].  Please show the CHEMO ALERT CARD at check-in to the Emergency Department and triage nurse.    

## 2016-10-16 NOTE — Progress Notes (Signed)
Per Dr. Julien Nordmann okay to treat today with Crt of 2.

## 2016-10-17 ENCOUNTER — Ambulatory Visit
Admission: RE | Admit: 2016-10-17 | Discharge: 2016-10-17 | Disposition: A | Discharge status: Home / Self Care | Payer: 59 | Source: Ambulatory Visit | Attending: Radiation Oncology | Admitting: Radiation Oncology

## 2016-10-17 ENCOUNTER — Ambulatory Visit (INDEPENDENT_AMBULATORY_CARE_PROVIDER_SITE_OTHER): Payer: Managed Care, Other (non HMO) | Admitting: Internal Medicine

## 2016-10-17 ENCOUNTER — Encounter: Payer: Self-pay | Admitting: Internal Medicine

## 2016-10-17 VITALS — BP 142/70 | HR 89 | Ht 73.0 in | Wt 286.0 lb

## 2016-10-17 DIAGNOSIS — I428 Other cardiomyopathies: Secondary | ICD-10-CM | POA: Diagnosis not present

## 2016-10-17 DIAGNOSIS — I5022 Chronic systolic (congestive) heart failure: Secondary | ICD-10-CM

## 2016-10-17 DIAGNOSIS — I4891 Unspecified atrial fibrillation: Secondary | ICD-10-CM | POA: Diagnosis not present

## 2016-10-17 DIAGNOSIS — C3411 Malignant neoplasm of upper lobe, right bronchus or lung: Secondary | ICD-10-CM | POA: Diagnosis not present

## 2016-10-17 DIAGNOSIS — R590 Localized enlarged lymph nodes: Secondary | ICD-10-CM | POA: Diagnosis not present

## 2016-10-17 DIAGNOSIS — Z95810 Presence of automatic (implantable) cardiac defibrillator: Secondary | ICD-10-CM | POA: Diagnosis not present

## 2016-10-17 DIAGNOSIS — I480 Paroxysmal atrial fibrillation: Secondary | ICD-10-CM

## 2016-10-17 DIAGNOSIS — I110 Hypertensive heart disease with heart failure: Secondary | ICD-10-CM | POA: Diagnosis not present

## 2016-10-17 DIAGNOSIS — Z510 Encounter for antineoplastic radiation therapy: Secondary | ICD-10-CM | POA: Diagnosis not present

## 2016-10-17 MED ORDER — ULORIC 40 MG PO TABS: 40.0000 mg | ORAL_TABLET | Freq: Every day | ORAL | 0 refills | Status: AC

## 2016-10-17 NOTE — Patient Instructions (Addendum)
Medication Instructions: - Your physician recommends that you continue on your current medications as directed. Please refer to the Current Medication list given to you today.  Labwork: - Your physician recommends that you have lab work in August with your next lab draw with Dr. Inda Merlin- TSH  Procedures/Testing: - none ordered  Follow-Up: - Remote monitoring is used to monitor your Pacemaker of ICD from home. This monitoring reduces the number of office visits required to check your device to one time per year. It allows [place] to keep an eye on the functioning of your device to ensure it is working properly. You are scheduled for a device check from home on 01/16/17. You may send your transmission at any time that day. If you have a wireless device, the transmission will be sent automatically. After your physician reviews your transmission, you will receive a postcard with your next transmission date.  - Your physician wants you to follow-up in: 1 year with Dr. Caryl Comes. You will receive a reminder letter in the mail two months in advance. If you don't receive a letter, please call our office to schedule the follow-up appointment.   Any Additional Special Instructions Will Be Listed Below (If Applicable).     If you need a refill on your cardiac medications before your next appointment, please call your pharmacy.

## 2016-10-17 NOTE — Progress Notes (Signed)
Patient Care Team: Lucianne Lei, MD as PCP - General (Family Medicine) Fay Records, MD (Cardiology)   HPI  Jason Russell is a 66 y.o. male Seen in follow-up for an ICD implanted 2014 for primary prevention in the context of nonischemic heart disease. He also has a history of paroxysmal atrial fibrillation  The patient denies chest pain, shortness of breath, nocturnal dyspnea, orthopnea or peripheral edema.  There have been no palpitations, lightheadedness or syncope.    Intercurrently been diagnosed with lung Ca and has just finished his chemo and XRT   Tolerated well   Patient denies symptoms of GI intolerance, sun sensitivity, neurological symptoms attributable to amiodarone.  Surveillance laboratories were in normal limits when checked 12 months ago  Date Cr/GFR Hgb  7/18 2.0/65 11.2         Date TSH LFTs PFTs  2/18  2.0      6/18    18      Echocardiogram 7/13  25% Echocardiogram 1/14 35% LHC  4/14  No obstructive CAD Echo 9/16   Past Medical History:  Diagnosis Date  . Adenocarcinoma of right lung, stage 3 ([place]) 08/23/2016  . Adenocarcinoma of right lung, stage 3 ([place]) 08/23/2016  . Anemia   . Arthritis    "hx right hip"  . Asthma    "when I was a child"  . Atrial fibrillation (HCC)    Amiodarone started 10/2011; Coumadin  . 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 St. Jude ICD implantation 10/03/12.  . Chronic anticoagulation   . Chronic systolic heart failure ([place])    a. Echo 7/13: EF 25%;  b. echo 04/2012:  Mild LVH, EF 30-35%, Gr 1 DD, mild AI, mild MR, mild LAE  . COPD (chronic obstructive pulmonary disease) ([place])   . Dyslipidemia   . Dysrhythmia   . Encounter for antineoplastic chemotherapy 08/24/2016  . Gout   . History of blood transfusion 10/15/2013   "don't know where the blood's going; HgB down to 5"  . Hyperlipidemia   . Hypertension   . Hypothyroidism   . NICM (nonischemic cardiomyopathy) ([place])    [place]  4/14:  minimal CAD  . Obesity   . OSA on CPAP   . Tobacco abuse     Past Surgical History:  Procedure Laterality Date  . CARDIAC DEFIBRILLATOR PLACEMENT  2014  . CARDIOVERSION  2011  . COLONOSCOPY WITH PROPOFOL Left 10/17/2013   Procedure: COLONOSCOPY WITH PROPOFOL;  Surgeon: Inda Castle, MD;  Location: [facility];  Service: Endoscopy;  Laterality: Left;  . ESOPHAGOGASTRODUODENOSCOPY N/A 10/17/2013   Procedure: ESOPHAGOGASTRODUODENOSCOPY (EGD);  Surgeon: Inda Castle, MD;  Location: [facility];  Service: Endoscopy;  Laterality: N/A;  . GIVENS CAPSULE STUDY N/A 10/29/2013   Procedure: GIVENS CAPSULE STUDY;  Surgeon: Inda Castle, MD;  Location: [facility];  Service: Endoscopy;  Laterality: N/A;  . IMPLANTABLE CARDIOVERTER DEFIBRILLATOR IMPLANT Left 10/03/2012   Procedure: IMPLANTABLE CARDIOVERTER DEFIBRILLATOR IMPLANT;  Surgeon: Deboraha Sprang, MD;  Location: [facility] CATH LAB;  Service: Cardiovascular;  Laterality: Left;  . IR FLUORO GUIDE PORT INSERTION RIGHT  09/21/2016  . IR US GUIDE VASC ACCESS RIGHT  09/21/2016  . JOINT REPLACEMENT    . TONSILLECTOMY  1950's  . TOTAL HIP ARTHROPLASTY Right 11/25/1997  . VIDEO BRONCHOSCOPY WITH ENDOBRONCHIAL ULTRASOUND N/A 08/09/2016   Procedure: VIDEO BRONCHOSCOPY WITH ENDOBRONCHIAL ULTRASOUND;  Surgeon: Javier Glazier, MD;  Location: [facility];  Service: Thoracic;  Laterality: N/A;    Current Outpatient Prescriptions  Medication Sig Dispense Refill  . acetaminophen (TYLENOL) 500 MG tablet Take 1,000 mg by mouth every 6 (six) hours as needed for mild pain.    [name] albuterol (PROVENTIL HFA;VENTOLIN HFA) 108 (90 Base) MCG/ACT inhaler Inhale 2 puffs into the lungs every 4 (four) hours as needed for wheezing or shortness of breath. 1 Inhaler 2  . amiodarone (PACERONE) 100 MG tablet Take 1 tablet (100 mg total) by mouth daily. 90 tablet 2  . atorvastatin (LIPITOR) 20 MG tablet TAKE 1 TABLET BY MOUTH EVERY DAY 90 tablet 3  . buPROPion (WELLBUTRIN SR) 150  MG 12 hr tablet TAKE 1 TABLET (150 MG TOTAL) BY MOUTH 2 (TWO) TIMES DAILY. (Patient taking differently: Take 150 mg by mouth 2 (two) times daily. ) 180 tablet 2  . carvedilol (COREG) 12.5 MG tablet TAKE 1 TABLET (12.5 MG TOTAL) BY MOUTH 2 (TWO) TIMES DAILY. 180 tablet 3  . colchicine 0.6 MG tablet Take 1 tablet by mouth as needed (gout flares).     . ferrous gluconate (FERGON) 324 MG tablet Take 1 tablet (324 mg total) by mouth 3 (three) times daily with meals. 90 tablet 8  . furosemide (LASIX) 20 MG tablet Take 3 tabs (60 mg) every Tue and Sat, all other days take 2 tabs (40 mg) (Patient taking differently: Take 40-60 mg by mouth See admin instructions. Take 3 tabs (60 mg) every Tue and Sat, all other days take 2 tabs (40 mg)) 64 tablet 6  . hydrALAZINE (APRESOLINE) 100 MG tablet Take 1 tablet by mouth 3 (three) times daily.  1  . levothyroxine (SYNTHROID, LEVOTHROID) 50 MCG tablet TAKE 1 TABLET (50 MCG TOTAL) BY MOUTH DAILY BEFORE BREAKFAST. 90 tablet 3  . lidocaine-prilocaine (EMLA) cream Apply generous amount to port site at least 1 hr prior to treatment.  DO NOT RUB IN. 30 g 0  . lisinopril (PRINIVIL,ZESTRIL) 40 MG tablet TAKE 1 TABLET BY MOUTH DAILY. 90 tablet 2  . Polyvinyl Alcohol-Povidone (REFRESH OP) Apply 1 drop to eye daily.    . potassium chloride SA (KLOR-CON M20) 20 MEQ tablet Take 1 tablet every day.  On days when lasix is taken, take 2 tablets (Patient taking differently: Take 20 mEq by mouth See admin instructions. Take 1 tablet every day.  On days when lasix is taken, take 2 tablets) 135 tablet 3  . prochlorperazine (COMPAZINE) 10 MG tablet Take 1 tablet (10 mg total) by mouth every 6 (six) hours as needed for nausea or vomiting. 30 tablet 0  . rivaroxaban (XARELTO) 20 MG TABS tablet Take 1 tablet (20 mg total) by mouth daily. 90 tablet 2  . sildenafil (REVATIO) 20 MG tablet Take 1 tablet (20 mg total) by mouth at bedtime. Take 2-3 tablets daily prn for ED 30 tablet 2  .  spironolactone (ALDACTONE) 25 MG tablet TAKE 0.5 TABLETS (12.5 MG TOTAL) BY MOUTH DAILY. 45 tablet 2  . sucralfate (CARAFATE) 1 g tablet Take 1 tablet (1 g total) by mouth 4 (four) times daily -  with meals and at bedtime. 5 min before meals for radiation induced esophagitis 120 tablet 2  . ULORIC 40 MG tablet Take 1 tablet by mouth daily.    . Vitamin D, Ergocalciferol, (DRISDOL) 50000 units CAPS capsule Take 1 capsule by mouth once a week. Monday    . Wound Cleansers (RADIAPLEX EX) Apply topically.     No current facility-administered medications for this visit.  No Known Allergies    Review of Systems negative except from HPI and PMH  Physical Exam BP (!) 142/70   Pulse 89   Ht 6\' 1"  (1.854 m)   Wt 286 lb (129.7 kg)   SpO2 98%   BMI 37.73 kg/m  Well developed and well nourished in no acute distress HENT normal E scleral and icterus clear Neck Supple JVP flat; carotids brisk and full Clear to ausculation Device pocket well healed; without hematoma or erythema.  There is no tethering  *Regular rate and rhythm, no murmurs gallops or rub Soft with active bowel sounds No clubbing cyanosis no Edema Alert and oriented, grossly normal motor and sensory function Skin Warm and Dry  ECGm sinus 78 17/11/40 NSST  Assessment and  Plan  HTN  ICD St Jude The patient's device was interrogated.  The information was reviewed. No changes were made in the programming.     CHF systolic chronic  Obesity  NICM  Afib-proxysmal  Renal Insufficiency gd 3   OSA  CPAP    Blood Pressure reasonably controlled    Euvolemic continue current meds  No intercurrent atrial fibrillation or flutter  On Anticoagulation;  No bleeding issues   With changing renal function Rivaroxaban dose is still appropriate  Will need th check TSH on amio,  Will arrange to be drawn at next draw with oncology

## 2016-10-18 ENCOUNTER — Encounter: Payer: Self-pay | Admitting: Internal Medicine

## 2016-10-18 ENCOUNTER — Ambulatory Visit
Admission: RE | Admit: 2016-10-18 | Discharge: 2016-10-18 | Disposition: A | Discharge status: Home / Self Care | Payer: 59 | Source: Ambulatory Visit | Attending: Radiation Oncology | Admitting: Radiation Oncology

## 2016-10-18 ENCOUNTER — Other Ambulatory Visit: Payer: Self-pay | Admitting: Internal Medicine

## 2016-10-18 DIAGNOSIS — C3411 Malignant neoplasm of upper lobe, right bronchus or lung: Secondary | ICD-10-CM | POA: Diagnosis not present

## 2016-10-18 DIAGNOSIS — I110 Hypertensive heart disease with heart failure: Secondary | ICD-10-CM | POA: Diagnosis not present

## 2016-10-18 DIAGNOSIS — Z510 Encounter for antineoplastic radiation therapy: Secondary | ICD-10-CM | POA: Diagnosis not present

## 2016-10-18 DIAGNOSIS — Z95810 Presence of automatic (implantable) cardiac defibrillator: Secondary | ICD-10-CM | POA: Diagnosis not present

## 2016-10-18 DIAGNOSIS — I4891 Unspecified atrial fibrillation: Secondary | ICD-10-CM | POA: Diagnosis not present

## 2016-10-18 DIAGNOSIS — R590 Localized enlarged lymph nodes: Secondary | ICD-10-CM | POA: Diagnosis not present

## 2016-10-18 DIAGNOSIS — R5382 Chronic fatigue, unspecified: Secondary | ICD-10-CM | POA: Insufficient documentation

## 2016-10-18 HISTORY — DX: Chronic fatigue, unspecified: R53.82

## 2016-10-19 ENCOUNTER — Ambulatory Visit: Payer: 59

## 2016-10-19 ENCOUNTER — Ambulatory Visit
Admission: RE | Admit: 2016-10-19 | Discharge: 2016-10-19 | Disposition: A | Discharge status: Home / Self Care | Payer: 59 | Source: Ambulatory Visit | Attending: Radiation Oncology | Admitting: Radiation Oncology

## 2016-10-19 DIAGNOSIS — I4891 Unspecified atrial fibrillation: Secondary | ICD-10-CM | POA: Diagnosis not present

## 2016-10-19 DIAGNOSIS — I110 Hypertensive heart disease with heart failure: Secondary | ICD-10-CM | POA: Diagnosis not present

## 2016-10-19 DIAGNOSIS — Z95810 Presence of automatic (implantable) cardiac defibrillator: Secondary | ICD-10-CM | POA: Diagnosis not present

## 2016-10-19 DIAGNOSIS — Z510 Encounter for antineoplastic radiation therapy: Secondary | ICD-10-CM | POA: Diagnosis not present

## 2016-10-19 DIAGNOSIS — R590 Localized enlarged lymph nodes: Secondary | ICD-10-CM | POA: Diagnosis not present

## 2016-10-19 DIAGNOSIS — C3411 Malignant neoplasm of upper lobe, right bronchus or lung: Secondary | ICD-10-CM | POA: Diagnosis not present

## 2016-10-20 ENCOUNTER — Encounter: Payer: Self-pay | Admitting: Radiation Oncology

## 2016-10-20 ENCOUNTER — Ambulatory Visit
Admission: RE | Admit: 2016-10-20 | Discharge: 2016-10-20 | Disposition: A | Discharge status: Home / Self Care | Payer: 59 | Source: Ambulatory Visit | Attending: Radiation Oncology | Admitting: Radiation Oncology

## 2016-10-20 ENCOUNTER — Other Ambulatory Visit: Payer: Self-pay | Admitting: Internal Medicine

## 2016-10-20 DIAGNOSIS — I110 Hypertensive heart disease with heart failure: Secondary | ICD-10-CM | POA: Diagnosis not present

## 2016-10-20 DIAGNOSIS — Z510 Encounter for antineoplastic radiation therapy: Secondary | ICD-10-CM | POA: Diagnosis not present

## 2016-10-20 DIAGNOSIS — R590 Localized enlarged lymph nodes: Secondary | ICD-10-CM | POA: Diagnosis not present

## 2016-10-20 DIAGNOSIS — C3411 Malignant neoplasm of upper lobe, right bronchus or lung: Secondary | ICD-10-CM | POA: Diagnosis not present

## 2016-10-20 DIAGNOSIS — I4891 Unspecified atrial fibrillation: Secondary | ICD-10-CM | POA: Diagnosis not present

## 2016-10-20 DIAGNOSIS — Z95810 Presence of automatic (implantable) cardiac defibrillator: Secondary | ICD-10-CM | POA: Diagnosis not present

## 2016-10-20 NOTE — Progress Notes (Signed)
  Radiation Oncology         [phone] ________________________________  Name: Jason Russell MRN: [id number]  Date: 10/20/2016  DOB: 03-13-1951   End of Treatment Note  Diagnosis:  66 y.o. man with clinical stage T1a N2 M0 right upper lung adenocarcinoma - Stage IIIA  Indication for treatment:  Curative, Chemo-Radiotherapy       Radiation treatment dates:  09/05/2016-10/20/2016  Site/dose:   The primary tumor and involved mediastinal adenopathy were treated to 66 Gy in 33 fractions of 2 Gy.  Beams/energy:   A five field 3D conformal treatment arrangement was used delivering 6 and 10 MV photons.  Daily image-guidance CT was used to align the treatment with the targeted volume  Narrative: The patient tolerated radiation treatment relatively well.  The patient experienced some esophagitis characterized as mild.  The patient also noted fatigue.  Plan: The patient has completed radiation treatment. The patient will return to radiation oncology clinic for routine followup in one month. I advised him to call or return sooner if he has any questions or concerns related to his recovery or treatment.  ________________________________  Sheral Apley. Tammi Klippel, M.D. This document serves as a record of services personally performed by Tyler Pita, MD. It was created on his behalf by Valeta Harms, a trained medical scribe. The creation of this record is based on the scribe's personal observations and the provider's statements to them. This document has been checked and approved by the attending provider.

## 2016-10-20 NOTE — Telephone Encounter (Signed)
Pharmacy requesting a refill on Levothyroxine 50 mcg. Would you like to refill this medication? Please advise

## 2016-10-27 DIAGNOSIS — N183 Chronic kidney disease, stage 3 (moderate): Secondary | ICD-10-CM | POA: Diagnosis not present

## 2016-11-01 ENCOUNTER — Other Ambulatory Visit: Payer: Self-pay | Admitting: Internal Medicine

## 2016-11-10 ENCOUNTER — Other Ambulatory Visit (HOSPITAL_BASED_OUTPATIENT_CLINIC_OR_DEPARTMENT_OTHER): Payer: 59

## 2016-11-10 DIAGNOSIS — Z5111 Encounter for antineoplastic chemotherapy: Secondary | ICD-10-CM

## 2016-11-10 DIAGNOSIS — E039 Hypothyroidism, unspecified: Secondary | ICD-10-CM

## 2016-11-10 DIAGNOSIS — C3411 Malignant neoplasm of upper lobe, right bronchus or lung: Secondary | ICD-10-CM

## 2016-11-10 DIAGNOSIS — R5382 Chronic fatigue, unspecified: Secondary | ICD-10-CM

## 2016-11-10 LAB — CBC WITH DIFFERENTIAL/PLATELET
BASO%: 0.5 % (ref 0.0–2.0)
BASOS ABS: 0 10*3/uL (ref 0.0–0.1)
EOS%: 1.6 % (ref 0.0–7.0)
Eosinophils Absolute: 0.1 10*3/uL (ref 0.0–0.5)
HEMATOCRIT: 34.5 % — AB (ref 38.4–49.9)
HEMOGLOBIN: 11.4 g/dL — AB (ref 13.0–17.1)
LYMPH#: 0.3 10*3/uL — AB (ref 0.9–3.3)
LYMPH%: 8.1 % — ABNORMAL LOW (ref 14.0–49.0)
MCH: 29.7 pg (ref 27.2–33.4)
MCHC: 33 g/dL (ref 32.0–36.0)
MCV: 89.8 fL (ref 79.3–98.0)
MONO#: 0.6 10*3/uL (ref 0.1–0.9)
MONO%: 17.8 % — ABNORMAL HIGH (ref 0.0–14.0)
NEUT#: 2.5 10*3/uL (ref 1.5–6.5)
NEUT%: 72 % (ref 39.0–75.0)
PLATELETS: 88 10*3/uL — AB (ref 140–400)
RBC: 3.84 10*6/uL — ABNORMAL LOW (ref 4.20–5.82)
RDW: 20.1 % — AB (ref 11.0–14.6)
WBC: 3.4 10*3/uL — ABNORMAL LOW (ref 4.0–10.3)

## 2016-11-10 LAB — COMPREHENSIVE METABOLIC PANEL
ALBUMIN: 2.9 g/dL — AB (ref 3.5–5.0)
ALK PHOS: 117 U/L (ref 40–150)
ALT: 15 U/L (ref 0–55)
ANION GAP: 8 meq/L (ref 3–11)
AST: 17 U/L (ref 5–34)
BILIRUBIN TOTAL: 0.38 mg/dL (ref 0.20–1.20)
BUN: 23 mg/dL (ref 7.0–26.0)
CALCIUM: 9.5 mg/dL (ref 8.4–10.4)
CO2: 28 mEq/L (ref 22–29)
CREATININE: 1.7 mg/dL — AB (ref 0.7–1.3)
Chloride: 107 mEq/L (ref 98–109)
EGFR: 49 mL/min/{1.73_m2} — ABNORMAL LOW (ref >90)
Glucose: 100 mg/dl (ref 70–140)
Potassium: 3.6 mEq/L (ref 3.5–5.1)
Sodium: 143 mEq/L (ref 136–145)
Total Protein: 5.9 g/dL — ABNORMAL LOW (ref 6.4–8.3)

## 2016-11-10 LAB — TSH: TSH: 1.979 m[IU]/L (ref 0.320–4.118)

## 2016-11-13 ENCOUNTER — Encounter: Payer: Self-pay | Admitting: Internal Medicine

## 2016-11-13 ENCOUNTER — Ambulatory Visit (HOSPITAL_BASED_OUTPATIENT_CLINIC_OR_DEPARTMENT_OTHER): Payer: 59 | Admitting: Internal Medicine

## 2016-11-13 ENCOUNTER — Telehealth: Payer: Self-pay

## 2016-11-13 VITALS — BP 134/91 | HR 69 | Temp 98.3°F | Resp 18

## 2016-11-13 DIAGNOSIS — Z5111 Encounter for antineoplastic chemotherapy: Secondary | ICD-10-CM

## 2016-11-13 DIAGNOSIS — C3411 Malignant neoplasm of upper lobe, right bronchus or lung: Secondary | ICD-10-CM

## 2016-11-13 NOTE — Telephone Encounter (Signed)
Not sure why scan was not approved/scheduled.  Inbasket to [facility] for precert and patient aware that we will call with appts  Jason Russell

## 2016-11-13 NOTE — Progress Notes (Signed)
[facility] [phone]   [phone]  OFFICE PROGRESS NOTE  Jason Lei, MD [address] [place] [address]  DIAGNOSIS: Stage IIIA (T1a, N2, M0) non-small cell lung cancer, adenocarcinoma presented with right upper lobe lung nodule in addition to mediastinal lymphadenopathy diagnosed in March 2018.  Biomarker Findings Tumor Mutational Burden - TMB-Intermediate (8 Muts/Mb) Microsatellite Status - MS-Stable Genomic Findings For a complete list of the genes assayed, please refer to the Appendix. ERBB2 amplification - equivocal? DNMT3A K473f*192 FUBP1 Q40* KEAP1 [id number] TP53 C275F 7 Disease relevant genes with no reportable alterations: EGFR, KRAS, ALK, BRAF, MET, RET, ROS1   PRIOR THERAPY: course of concurrent chemoradiation with weekly carboplatin for AUC of 2 and paclitaxel 45 MG/M2. Status post 6 cycles. Last cycle was given 10/16/2016.  CURRENT THERAPY:   INTERVAL HISTORY: Jason Niet[age] male returns to the clinic today for follow-up visit. The patient is feeling much better today. He continues to have shortness of breath with exertion but no significant chest pain, cough or hemoptysis. He denied having any fever or chills. He has no nausea, vomiting, diarrhea or constipation. The patient has no significant weight loss or night sweats. He tolerated the previous course of concurrent chemoradiation fairly well. He was supposed to have restaging CT scan of the chest on 11/10/2016 but unfortunately CT scan was not scheduled as ordered. He is here today for reevaluation.   MEDICAL HISTORY: Past Medical History:  Diagnosis Date  . Adenocarcinoma of right lung, stage 3 ([facility]/23/2018  . Adenocarcinoma of right lung, stage 3 ([facility]/23/2018  . Anemia   . Arthritis    "hx right hip"  . Asthma    "when I was a child"  . Atrial fibrillation (HCC)    Amiodarone started 10/2011; Coumadin  . Automatic implantable  cardioverter-defibrillator in situ 10/03/2012   a. St. Jude ICD implantation 10/03/12.  . Chronic anticoagulation   . Chronic fatigue 10/18/2016  . Chronic fatigue 10/18/2016  . Chronic systolic heart failure ([facility]   a. Echo 7/13: EF 25%;  b. echo 04/2012:  Mild LVH, EF 30-35%, Gr 1 DD, mild AI, mild MR, mild LAE  . COPD (chronic obstructive pulmonary disease) ([facility]  . Dyslipidemia   . Dysrhythmia   . Encounter for antineoplastic chemotherapy 08/24/2016  . Gout   . History of blood transfusion 10/15/2013   "don't know where the blood's going; HgB down to 5"  . Hyperlipidemia   . Hypertension   . Hypothyroidism   . NICM (nonischemic cardiomyopathy) ([facility]   [place]/14:  minimal CAD  . Obesity   . OSA on CPAP   . Tobacco abuse     ALLERGIES:  has No Known Allergies.  MEDICATIONS:  Current Outpatient Prescriptions  Medication Sig Dispense Refill  . acetaminophen (TYLENOL) 500 MG tablet Take 1,000 mg by mouth every 6 (six) hours as needed for mild pain.    . Marland Kitchenlbuterol (PROVENTIL HFA;VENTOLIN HFA) 108 (90 Base) MCG/ACT inhaler Inhale 2 puffs into the lungs every 4 (four) hours as needed for wheezing or shortness of breath. 1 Inhaler 2  . amiodarone (PACERONE) 100 MG tablet TAKE 1 TABLET BY MOUTH DAILY 90 tablet 1  . atorvastatin (LIPITOR) 20 MG tablet TAKE 1 TABLET BY MOUTH EVERY DAY 90 tablet 3  . buPROPion (WELLBUTRIN SR) 150 MG 12 hr tablet TAKE 1 TABLET (150 MG TOTAL) BY MOUTH 2 (TWO) TIMES DAILY. (Patient  taking differently: Take 150 mg by mouth 2 (two) times daily. ) 180 tablet 2  . carvedilol (COREG) 12.5 MG tablet TAKE 1 TABLET (12.5 MG TOTAL) BY MOUTH 2 (TWO) TIMES DAILY. 180 tablet 3  . colchicine 0.6 MG tablet Take 1 tablet by mouth as needed (gout flares).     . ferrous gluconate (FERGON) 324 MG tablet Take 1 tablet (324 mg total) by mouth 3 (three) times daily with meals. 90 tablet 8  . furosemide (LASIX) 20 MG tablet Take 3 tabs (60 mg) every Tue and Sat, all other days take 2  tabs (40 mg) (Patient taking differently: Take 40-60 mg by mouth See admin instructions. Take 3 tabs (60 mg) every Tue and Sat, all other days take 2 tabs (40 mg)) 64 tablet 6  . hydrALAZINE (APRESOLINE) 100 MG tablet Take 1 tablet by mouth 3 (three) times daily.  1  . levothyroxine (SYNTHROID, LEVOTHROID) 50 MCG tablet TAKE 1 TABLET (50 MCG TOTAL) BY MOUTH DAILY BEFORE BREAKFAST. 90 tablet 3  . lidocaine-prilocaine (EMLA) cream Apply generous amount to port site at least 1 hr prior to treatment.  DO NOT RUB IN. 30 g 0  . lisinopril (PRINIVIL,ZESTRIL) 40 MG tablet TAKE 1 TABLET BY MOUTH DAILY. 90 tablet 2  . Polyvinyl Alcohol-Povidone (REFRESH OP) Apply 1 drop to eye daily.    . potassium chloride SA (KLOR-CON M20) 20 MEQ tablet Take 1 tablet every day.  On days when lasix is taken, take 2 tablets (Patient taking differently: Take 20 mEq by mouth See admin instructions. Take 1 tablet every day.  On days when lasix is taken, take 2 tablets) 135 tablet 3  . prochlorperazine (COMPAZINE) 10 MG tablet Take 1 tablet (10 mg total) by mouth every 6 (six) hours as needed for nausea or vomiting. 30 tablet 0  . rivaroxaban (XARELTO) 20 MG TABS tablet Take 1 tablet (20 mg total) by mouth daily. 90 tablet 2  . sildenafil (REVATIO) 20 MG tablet Take 1 tablet (20 mg total) by mouth at bedtime. Take 2-3 tablets daily prn for ED 30 tablet 2  . spironolactone (ALDACTONE) 25 MG tablet TAKE 0.5 TABLETS (12.5 MG TOTAL) BY MOUTH DAILY. 45 tablet 2  . sucralfate (CARAFATE) 1 g tablet Take 1 tablet (1 g total) by mouth 4 (four) times daily -  with meals and at bedtime. 5 min before meals for radiation induced esophagitis 120 tablet 2  . ULORIC 40 MG tablet Take 1 tablet (40 mg total) by mouth daily. 30 tablet 0  . Vitamin D, Ergocalciferol, (DRISDOL) 50000 units CAPS capsule Take 1 capsule by mouth once a week. Monday    . Wound Cleansers (RADIAPLEX EX) Apply topically.     No current facility-administered medications for  this visit.     SURGICAL HISTORY:  Past Surgical History:  Procedure Laterality Date  . CARDIAC DEFIBRILLATOR PLACEMENT  2014  . CARDIOVERSION  2011  . COLONOSCOPY WITH PROPOFOL Left 10/17/2013   Procedure: COLONOSCOPY WITH PROPOFOL;  Surgeon: Inda Castle, MD;  Location: [facility];  Service: Endoscopy;  Laterality: Left;  . ESOPHAGOGASTRODUODENOSCOPY N/A 10/17/2013   Procedure: ESOPHAGOGASTRODUODENOSCOPY (EGD);  Surgeon: Inda Castle, MD;  Location: [facility];  Service: Endoscopy;  Laterality: N/A;  . GIVENS CAPSULE STUDY N/A 10/29/2013   Procedure: GIVENS CAPSULE STUDY;  Surgeon: Inda Castle, MD;  [facility];  Service: Endoscopy;  Laterality: N/A;  . IMPLANTABLE CARDIOVERTER DEFIBRILLATOR IMPLANT Left 10/03/2012   Procedure: IMPLANTABLE CARDIOVERTER DEFIBRILLATOR  IMPLANT;  Surgeon: Deboraha Sprang, MD;  Location: [facility] CATH LAB;  Service: Cardiovascular;  Laterality: Left;  . IR FLUORO GUIDE PORT INSERTION RIGHT  09/21/2016  . IR US GUIDE VASC ACCESS RIGHT  09/21/2016  . JOINT REPLACEMENT    . TONSILLECTOMY  1950's  . TOTAL HIP ARTHROPLASTY Right 11/25/1997  . VIDEO BRONCHOSCOPY WITH ENDOBRONCHIAL ULTRASOUND N/A 08/09/2016   Procedure: VIDEO BRONCHOSCOPY WITH ENDOBRONCHIAL ULTRASOUND;  Surgeon: Javier Glazier, MD;  Location[facility];  Service: Thoracic;  Laterality: N/A;    REVIEW OF SYSTEMS:  A comprehensive review of systems was negative except for: Respiratory: positive for dyspnea on exertion   PHYSICAL EXAMINATION: General appearance: alert, cooperative, fatigued and no distress Head: Normocephalic, without obvious abnormality, atraumatic Neck: no adenopathy, no JVD, supple, symmetrical, trachea midline and thyroid not enlarged, symmetric, no tenderness/mass/nodules Lymph nodes: Cervical, supraclavicular, and axillary nodes normal. Resp: clear to auscultation bilaterally Back: symmetric, no curvature. ROM normal. No CVA tenderness. Cardio: regular rate and  rhythm, S1, S2 normal, no murmur, click, rub or gallop GI: soft, non-tender; bowel sounds normal; no masses,  no organomegaly Extremities: extremities normal, atraumatic, no cyanosis or edema  ECOG PERFORMANCE STATUS: 1 - Symptomatic but completely ambulatory  There were no vitals taken for this visit.  LABORATORY DATA: Lab Results  Component Value Date   WBC 3.4 (L) 11/10/2016   HGB 11.4 (L) 11/10/2016   HCT 34.5 (L) 11/10/2016   MCV 89.8 11/10/2016   PLT 88 (L) 11/10/2016      Chemistry      Component Value Date/Time   NA 143 11/10/2016 0750   K 3.6 11/10/2016 0750   CL 106 09/21/2016 1209   CO2 28 11/10/2016 0750   BUN 23.0 11/10/2016 0750   CREATININE 1.7 (H) 11/10/2016 0750      Component Value Date/Time   CALCIUM 9.5 11/10/2016 0750   ALKPHOS 117 11/10/2016 0750   AST 17 11/10/2016 0750   ALT 15 11/10/2016 0750   BILITOT 0.38 11/10/2016 0750       RADIOGRAPHIC STUDIES: No results found.  ASSESSMENT AND PLAN:  This is a very pleasant 66 years old African-American male with a stage IIIa non-small cell lung cancer, adenocarcinoma.  The patient completed 6 weeks of concurrent chemoradiation with weekly carboplatin and paclitaxel and tolerated his treatment well except for odynophagia. He is feeling much better today. He was supposed to have restaging scan of the chest on 11/10/2016 but unfortunately this was not scheduled as ordered. I will arrange for the patient to have his scan done in the next few days and he would come back for follow-up visit in one week for reevaluation and discussion of his treatment options based on the scan results. The patient was advised to call immediately if he has any concerning symptoms in the interval. The patient voices understanding of current disease status and treatment options and is in agreement with the current care plan. All questions were answered. The patient knows to call the clinic with any problems, questions or  concerns. We can certainly see the patient much sooner if necessary. I spent 10 minutes counseling the patient face to face. The total time spent in the appointment was 15 minutes.  Disclaimer: This note was dictated with voice recognition software. Similar sounding words can inadvertently be transcribed and may not be corrected upon review.

## 2016-11-14 DIAGNOSIS — I130 Hypertensive heart and chronic kidney disease with heart failure and stage 1 through stage 4 chronic kidney disease, or unspecified chronic kidney disease: Secondary | ICD-10-CM | POA: Diagnosis not present

## 2016-11-14 DIAGNOSIS — C3490 Malignant neoplasm of unspecified part of unspecified bronchus or lung: Secondary | ICD-10-CM | POA: Diagnosis not present

## 2016-11-20 ENCOUNTER — Ambulatory Visit (HOSPITAL_COMMUNITY)
Admission: RE | Admit: 2016-11-20 | Discharge: 2016-11-20 | Disposition: A | Discharge status: Home / Self Care | Payer: Medicare Other | Source: Ambulatory Visit | Attending: Internal Medicine | Admitting: Internal Medicine

## 2016-11-20 ENCOUNTER — Ambulatory Visit (INDEPENDENT_AMBULATORY_CARE_PROVIDER_SITE_OTHER): Payer: Medicare Other | Admitting: *Deleted

## 2016-11-20 ENCOUNTER — Encounter (HOSPITAL_COMMUNITY): Payer: Self-pay

## 2016-11-20 DIAGNOSIS — I5022 Chronic systolic (congestive) heart failure: Secondary | ICD-10-CM

## 2016-11-20 DIAGNOSIS — R918 Other nonspecific abnormal finding of lung field: Secondary | ICD-10-CM | POA: Diagnosis not present

## 2016-11-20 DIAGNOSIS — Z95810 Presence of automatic (implantable) cardiac defibrillator: Secondary | ICD-10-CM | POA: Diagnosis not present

## 2016-11-20 DIAGNOSIS — C3411 Malignant neoplasm of upper lobe, right bronchus or lung: Secondary | ICD-10-CM | POA: Diagnosis not present

## 2016-11-20 DIAGNOSIS — Z5111 Encounter for antineoplastic chemotherapy: Secondary | ICD-10-CM | POA: Diagnosis not present

## 2016-11-20 DIAGNOSIS — I428 Other cardiomyopathies: Secondary | ICD-10-CM | POA: Diagnosis not present

## 2016-11-20 MED ORDER — IOPAMIDOL (ISOVUE-300) INJECTION 61%
75.0000 mL | Freq: Once | INTRAVENOUS | Status: AC | PRN
  Administered 2016-11-20: 75 mL via INTRAVENOUS

## 2016-11-20 MED ORDER — IOPAMIDOL (ISOVUE-300) INJECTION 61%
INTRAVENOUS | Status: AC
  Filled 2016-11-20: qty 75

## 2016-11-20 NOTE — Progress Notes (Signed)
EPIC Encounter for ICM Monitoring  Patient Name: Powell Halbert is a 66 y.o. male Date: 11/20/2016 Primary Care Physican: Lucianne Lei, MD Primary Cardiologist:Ross Electrophysiologist: Faustino Congress Weight:unknown           Heart Failure questions reviewed, pt asymptomatic.  Patient has been receiving treatment for lung cancer.    Thoracic impedance normal but was abnormal suggesting fluid accumulation from 7/18 to 7/28 and 11/15/2016 to 11/18/2016.  Prescribed dosage: Furosemide 20 mg 3 tablets (60 mg total) every Tuesday and Saturday and 40 mg all the other days. Potassium 20 mEq 1 tablet every day and on days when lasix is taken, take 2 tablets  Labs: 11/10/2016 Creatinine 1.7,   BUN 23,    Potassium 3.6, Sodium 143, EGFR 49 10/16/2016 Creatinine 2.0,   BUN 28.4, Potassium 4.1, Sodium 141, EGFR 40  10/09/2016 Creatinine 1.5,   BUN 25.7, Potassium 4.1, Sodium 143, EGFR 55  10/02/2016 Creatinine 1.6,   BUN 20.4, Potassium 3.9, Sodium 147, EGFR 53  09/25/2016 Creatinine 1.3,   BUN 21,    Potassium 4.1, Sodium 142, EGFR 65  09/21/2016 Creatinine 1.50, BUN 29,    Potassium 3.7, Sodium 143  09/18/2016 Creatinine 1.9,   BUN 25.8, Potassium 3.2, Sodium 144, EGFR 43  09/11/2016 Creatinine 1.6,   BUN 32.9, Potassium 3.7, Sodium 142, EGFR 51  09/05/2016 Creatinine 1.6,   BUN 19,    Potassium 3.3, Sodium 145 08/24/2016 Creatinine 1.6,   BUN 22,    Potassium 3.4, Sodium 143 08/04/2016 Creatinine 1.43, BUN 19,    Potassium 3.3, Sodium 140, EGFR 50-58 07/21/2016 Creatinine 1.76, BUN 28,    Potassium 3.4, Sodium 146 06/26/2016 Creatinine 1.50, BUN 22,    Potassium 3.8, Sodium 143, EGFR 49-56 05/11/2016 Creatinine 1.67, BUN 25,    Potassium 4.0, Sodium 145, EGFR 43-49 04/28/2016 Creatinine 1.37, BUN 22,    Potassium 3.8, Sodium 143, EGFR 54-63 04/10/2016 Creatinine 1.37, BUN 23,    Potassium 3.8, Sodium 143, EGFR 54-63  Recommendations: No changes.  Advised to limit salt intake to 2000 mg/day  and fluid intake to < 2 liters/day.  Encouraged to call for fluid symptoms.  Follow-up plan: ICM clinic phone appointment on 12/21/2016.   Copy of ICM check sent to device physician.   3 month ICM trend: 11/20/2016   1 Year ICM trend:            [name], RN 11/20/2016 8:21 AM

## 2016-11-20 NOTE — Progress Notes (Signed)
ICD Remote Transmission

## 2016-11-26 ENCOUNTER — Encounter: Payer: Self-pay | Admitting: Pulmonary Disease

## 2016-11-27 ENCOUNTER — Ambulatory Visit (HOSPITAL_BASED_OUTPATIENT_CLINIC_OR_DEPARTMENT_OTHER): Payer: Medicare Other | Admitting: Oncology

## 2016-11-27 ENCOUNTER — Encounter: Payer: Self-pay | Admitting: Pulmonary Disease

## 2016-11-27 ENCOUNTER — Other Ambulatory Visit: Payer: Self-pay | Admitting: Internal Medicine

## 2016-11-27 ENCOUNTER — Telehealth: Payer: Self-pay | Admitting: Internal Medicine

## 2016-11-27 ENCOUNTER — Other Ambulatory Visit: Payer: Medicare Other

## 2016-11-27 ENCOUNTER — Ambulatory Visit (INDEPENDENT_AMBULATORY_CARE_PROVIDER_SITE_OTHER): Payer: Medicare Other | Admitting: Pulmonary Disease

## 2016-11-27 ENCOUNTER — Encounter: Payer: Self-pay | Admitting: Oncology

## 2016-11-27 VITALS — BP 134/66 | HR 68 | Ht 73.0 in | Wt 287.0 lb

## 2016-11-27 VITALS — BP 133/75 | HR 87 | Temp 97.9°F | Resp 18 | Ht 73.0 in | Wt 287.7 lb

## 2016-11-27 DIAGNOSIS — Z9989 Dependence on other enabling machines and devices: Secondary | ICD-10-CM | POA: Diagnosis not present

## 2016-11-27 DIAGNOSIS — J449 Chronic obstructive pulmonary disease, unspecified: Secondary | ICD-10-CM

## 2016-11-27 DIAGNOSIS — C3491 Malignant neoplasm of unspecified part of right bronchus or lung: Secondary | ICD-10-CM

## 2016-11-27 DIAGNOSIS — R5382 Chronic fatigue, unspecified: Secondary | ICD-10-CM | POA: Diagnosis not present

## 2016-11-27 DIAGNOSIS — C3411 Malignant neoplasm of upper lobe, right bronchus or lung: Secondary | ICD-10-CM

## 2016-11-27 DIAGNOSIS — Z5112 Encounter for antineoplastic immunotherapy: Secondary | ICD-10-CM

## 2016-11-27 DIAGNOSIS — Z7189 Other specified counseling: Secondary | ICD-10-CM

## 2016-11-27 DIAGNOSIS — G4733 Obstructive sleep apnea (adult) (pediatric): Secondary | ICD-10-CM

## 2016-11-27 MED ORDER — FLUTICASONE-UMECLIDIN-VILANT 100-62.5-25 MCG/INH IN AEPB: 1.0000 | INHALATION_SPRAY | Freq: Every day | RESPIRATORY_TRACT | 0 refills | Status: DC

## 2016-11-27 NOTE — Patient Instructions (Addendum)
   Continue using your Albuterol inhaler as needed.  Use the Trelegy sample we are giving you by doing 1 inhalation once daily - preferably the same time every day.  Remember to remove any dentures or partials you have before you use your inhaler. Remember to brush your teeth & tongue after you use your inhaler as well as rinse, gargle & spit to keep from getting thrush in your mouth or on your tongue (a white film).   Contact me for a prescription if this seems to help your breathing & stop using it if you have any negative effects.   TESTS ORDERED: 1. Serum of 1 antitrypsin phenotype today

## 2016-11-27 NOTE — Progress Notes (Signed)
DISCONTINUE ON PATHWAY REGIMEN - Non-Small Cell Lung     Administer weekly:     Paclitaxel      Carboplatin   **Always confirm dose/schedule in your pharmacy ordering system**    REASON: Continuation Of Treatment PRIOR TREATMENT: [id number]: Carboplatin AUC=2 + Paclitaxel 45 mg/m2 Weekly During Radiation TREATMENT RESPONSE: Partial Response (PR)  START ON PATHWAY REGIMEN - Non-Small Cell Lung     A cycle is every 14 days:     Durvalumab   **Always confirm dose/schedule in your pharmacy ordering system**    Patient Characteristics: Stage III - Unresectable, PS = 0, 1 AJCC T Category: T1a Current Disease Status: No Distant Mets or Local Recurrence AJCC N Category: N2 AJCC M Category: M0 AJCC 8 Stage Grouping: IIIA Performance Status: PS = 0, 1 Intent of Therapy: Curative Intent, Discussed with Patient

## 2016-11-27 NOTE — Assessment & Plan Note (Addendum)
This is a very pleasant 66 year old African-American male with a stage IIIa non-small cell lung cancer, adenocarcinoma.  The patient completed 6 weeks of concurrent chemoradiation with weekly carboplatin and paclitaxel and tolerated his treatment well except for odynophagia. He is feeling much better today.   The patient was seen with Dr. Julien Nordmann. CT scan results were reviewed with the patient. Recommend that he proceed with immunotherapy with Imfinzi. Adverse effects of this treatment were discussed including but not limited to immune mediated the skin rash, diarrhea, inflammation of the lung, kidney, liver, thyroid or other endocrine dysfunction. Patient handout was given to Mr. Cataldo.  The patient is in agreement with this recommendation and would like to proceed. Treatment may be given up to one year as long as the patient is tolerating it and there is no progression of his cancer. Treatment will given every 2 weeks with plans to start on 11/06/2016.   Plan is for the patient to return in one week for labs and his immunotherapy. He will have a return visit with labs and immunotherapy in 3 weeks.

## 2016-11-27 NOTE — Progress Notes (Signed)
[facility] Follow up:    Jason Russell, [address] [place] [address]   DIAGNOSIS: Cancer Staging Primary cancer of right upper lobe of lung [facility]) Staging form: Lung, AJCC 7th Edition - Clinical stage from 08/24/2016: Stage IIIA (T1a, N2, M0) - Signed by Tyler Pita, MD on 08/25/2016 Stage IIIA (T1a, N2, M0) non-small cell lung cancer, adenocarcinoma presented with right upper lobe lung nodule in addition to mediastinal lymphadenopathy diagnosed in March 2018.  Biomarker Findings Tumor Mutational Burden - TMB-Intermediate (8 Muts/Mb) Microsatellite Status - MS-Stable Genomic Findings For a complete list of the genes assayed, please refer to the Appendix. ERBB2 amplification - equivocal? DNMT3A K456f*192 FUBP1 Q40* KEAP1 [id number] TP53 C275F 7 Disease relevant genes with no reportable alterations: EGFR, KRAS, ALK, BRAF, MET, RET, ROS1  SUMMARY OF ONCOLOGIC HISTORY: Oncology History   Patient presented with abnormal LDCT screening 07/11/16.     Primary cancer of right upper lobe of lung ([facility]  07/11/2016 Imaging    IMPRESSION: LDCT  Lung-RADS Category 4B, suspicious. Additional imaging evaluation or consultation with pulmonary medicine or thoracic surgery recommended. Right apical pulmonary nodule of volume derived equivalent diameter 13.6 mm. Concurrent mediastinal adenopathy      07/31/2016 Imaging    PET IMPRESSION:  Markedly hypermetabolic posterior right upper lobe pulmonary lesion consistent with malignancy and hypermetabolic mediastinal lymph node metastases.      08/09/2016 Surgery    EBUS Procedure: Level 4R:  1.5 cm lymph node / biopsy performed / ROSE / passes 4 - 6 for cytology & cell block / passes 7-9 for cell block only Level 4L:  No lymph node visualized Level 7:  2 cm lymph node / biopsy performed / ROSE / passes 1 - 3 for cytology & cell block / passes 4 & 5 for cell block only       08/09/2016 Pathology Results     Diagnosis FINE NEEDLE ASPIRATION, EBUS 4R (SPECIMEN 2 OF 2, COLLECTED 08/09/16): MALIGNANT CELLS CONSISTENT WITH ADENOCARCINOMA.       08/23/2016 Initial Diagnosis    Primary cancer of right upper lobe of lung (HCC)      CURRENT THERAPY: course of concurrent chemoradiation with weekly carboplatin for AUC of 2 and paclitaxel 45 MG/M2. Status post 6 cycles.  INTERVAL HISTORY: Jason Graziani66o. male returns for routine follow-up by himself. The patient continues to feel better since completing his chemoradiation therapy. He still has some pain with swallowing and remains on Carafate. He denies fevers and chills. Denies chest pain, shortness of breath at rest, cough, hemoptysis. He has his baseline shortness of breath with exertion. Denies nausea, vomiting, diarrhea, constipation.He has not had any significant weight loss or night sweats. The patient tolerated his previous course of concurrent chemoradiation fairly well. He is here to discuss restaging CT scan results.   Patient Active Problem List   Diagnosis Date Noted  . Chronic fatigue 10/18/2016  . Port catheter in place 09/25/2016  . Encounter for antineoplastic chemotherapy 08/24/2016  . Goals of care, counseling/discussion 08/24/2016  . Primary cancer of right upper lobe of lung ([facility]/23/2018  . Lung nodule < 6cm on CT   . Mediastinal adenopathy   . Lung nodule 07/21/2016  . Right rotator cuff tear 06/05/2016  . Obesity 10/07/2015  . COPD (chronic obstructive pulmonary disease) ([facility]/09/2015  . Exertional dyspnea 10/07/2015  . Anemia 10/29/2013  . Internal hemorrhoids without mention of complication 0791/[id number].  Iron deficiency anemia, unspecified 10/16/2013  . Symptomatic anemia 10/15/2013  . Nonischemic cardiomyopathy ([place]) 08/16/2012  . Shortness of breath dyspnea 11/01/2011  . Chronic anticoagulation 11/01/2011  . Chronic systolic heart failure ([place]) 11/01/2011  . Obstructive sleep apnea 12/20/2009  .  HYPERPOTASSEMIA 08/04/2009  . HYPOPOTASSEMIA 07/26/2009  . Hyperlipidemia 07/15/2009  . OVERWEIGHT/OBESITY 07/15/2009  . TOBACCO ABUSE 07/15/2009  . HYPERTENSION, BENIGN 07/15/2009    has No Known Allergies.  MEDICAL HISTORY: Past Medical History:  Diagnosis Date  . Adenocarcinoma of right lung, stage 3 ([place]) 08/23/2016  . Adenocarcinoma of right lung, stage 3 ([place]) 08/23/2016  . Anemia   . Arthritis    "hx right hip"  . Asthma    "when I was a child"  . Atrial fibrillation (HCC)    Amiodarone started 10/2011; Coumadin  . 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 St. Jude ICD implantation 10/03/12.  . Chronic anticoagulation   . Chronic fatigue 10/18/2016  . Chronic fatigue 10/18/2016  . Chronic systolic heart failure ([place])    a. Echo 7/13: EF 25%;  b. echo 04/2012:  Mild LVH, EF 30-35%, Gr 1 DD, mild AI, mild MR, mild LAE  . COPD (chronic obstructive pulmonary disease) ([place])   . Dyslipidemia   . Dysrhythmia   . Encounter for antineoplastic chemotherapy 08/24/2016  . Gout   . History of blood transfusion 10/15/2013   "don't know where the blood's going; HgB down to 5"  . Hyperlipidemia   . Hypertension   . Hypothyroidism   . NICM (nonischemic cardiomyopathy) ([place])    [place] 4/14:  minimal CAD  . Obesity   . OSA on CPAP   . Tobacco abuse     SURGICAL HISTORY: Past Surgical History:  Procedure Laterality Date  . CARDIAC DEFIBRILLATOR PLACEMENT  2014  . CARDIOVERSION  2011  . COLONOSCOPY WITH PROPOFOL Left 10/17/2013   Procedure: COLONOSCOPY WITH PROPOFOL;  Surgeon: Inda Castle, MD;  Location: [facility];  Service: Endoscopy;  Laterality: Left;  . ESOPHAGOGASTRODUODENOSCOPY N/A 10/17/2013   Procedure: ESOPHAGOGASTRODUODENOSCOPY (EGD);  Surgeon: Inda Castle, MD;  Location: [facility];  Service: Endoscopy;  Laterality: N/A;  . GIVENS CAPSULE STUDY N/A 10/29/2013   Procedure: GIVENS CAPSULE STUDY;  Surgeon: Inda Castle, MD;  Location:  [facility];  Service: Endoscopy;  Laterality: N/A;  . IMPLANTABLE CARDIOVERTER DEFIBRILLATOR IMPLANT Left 10/03/2012   Procedure: IMPLANTABLE CARDIOVERTER DEFIBRILLATOR IMPLANT;  Surgeon: Deboraha Sprang, MD;  Location: [facility] CATH LAB;  Service: Cardiovascular;  Laterality: Left;  . IR FLUORO GUIDE PORT INSERTION RIGHT  09/21/2016  . IR US GUIDE VASC ACCESS RIGHT  09/21/2016  . JOINT REPLACEMENT    . TONSILLECTOMY  1950's  . TOTAL HIP ARTHROPLASTY Right 11/25/1997  . VIDEO BRONCHOSCOPY WITH ENDOBRONCHIAL ULTRASOUND N/A 08/09/2016   Procedure: VIDEO BRONCHOSCOPY WITH ENDOBRONCHIAL ULTRASOUND;  Surgeon: Javier Glazier, MD;  Location: [facility];  Service: Thoracic;  Laterality: N/A;    SOCIAL HISTORY: Social History   Social History  . Marital status: Married    Spouse name: N/A  . Number of children: 1  . Years of education: N/A   Occupational History  . [employer] - Freight forwarder [employer]   Social History Main Topics  . Smoking status: Former Smoker    Packs/day: 0.25    Years: 45.00    Types: Cigarettes    Quit date: 07/11/2016  . Smokeless tobacco: Never Used  . Alcohol use 0.6 oz/week  1 Cans of beer per week  . Drug use: No  . Sexual activity: Not Currently   Other Topics Concern  . Not on file   Social History Narrative  . No narrative on file    FAMILY HISTORY: Family History  Problem Relation Age of Onset  . Hypertension Mother   . Other Father        deceased- unknow reason  . Lung cancer Brother     Review of Systems  Constitutional: Negative.   HENT:   Positive for trouble swallowing. Negative for mouth sores and sore throat.   Eyes: Negative.   Respiratory: Negative for cough and hemoptysis.        Report shortness of breath with exertion  Cardiovascular: Negative.   Gastrointestinal: Negative.   Genitourinary: Negative.    Musculoskeletal: Negative.   Skin: Negative.   Neurological: Negative.   Hematological: Negative.    Psychiatric/Behavioral: Negative.       PHYSICAL EXAMINATION  ECOG PERFORMANCE STATUS: 1 - Symptomatic but completely ambulatory  Vitals:   11/27/16 1127  BP: 133/75  Pulse: 87  Resp: 18  Temp: 97.9 F (36.6 C)  SpO2: 100%    Physical Exam  Constitutional: He is oriented to person, place, and time and well-developed, well-nourished, and in no distress. No distress.  HENT:  Head: Normocephalic.  Mouth/Throat: Oropharynx is clear and moist. No oropharyngeal exudate.  Eyes: Conjunctivae are normal. No scleral icterus.  Neck: Normal range of motion. Neck supple.  Cardiovascular: Normal rate, regular rhythm, normal heart sounds and intact distal pulses.   Pulmonary/Chest: Effort normal and breath sounds normal. No respiratory distress. He has no wheezes.  Abdominal: Soft. Bowel sounds are normal. He exhibits no distension and no mass. There is no tenderness.  Musculoskeletal: Normal range of motion. He exhibits no edema.  Lymphadenopathy:    He has no cervical adenopathy.  Neurological: He is alert and oriented to person, place, and time. He exhibits normal muscle tone. Gait normal.  Skin: Skin is warm and dry. No rash noted. He is not diaphoretic. No erythema. No pallor.  Psychiatric: Mood, memory, affect and judgment normal.  Vitals reviewed.   LABORATORY DATA:  CBC    Component Value Date/Time   WBC 3.4 (L) 11/10/2016 0750   WBC 2.6 (L) 09/21/2016 1209   RBC 3.84 (L) 11/10/2016 0750   RBC 3.87 (L) 09/21/2016 1209   HGB 11.4 (L) 11/10/2016 0750   HCT 34.5 (L) 11/10/2016 0750   PLT 88 (L) 11/10/2016 0750   PLT 182 07/21/2016 1429   MCV 89.8 11/10/2016 0750   MCH 29.7 11/10/2016 0750   MCH 28.7 09/21/2016 1209   MCHC 33.0 11/10/2016 0750   MCHC 32.4 09/21/2016 1209   RDW 20.1 (H) 11/10/2016 0750   LYMPHSABS 0.3 (L) 11/10/2016 0750   MONOABS 0.6 11/10/2016 0750   EOSABS 0.1 11/10/2016 0750   EOSABS 0.1 07/21/2016 1429   BASOSABS 0.0 11/10/2016 0750    CMP      Component Value Date/Time   NA 143 11/10/2016 0750   K 3.6 11/10/2016 0750   CL 106 09/21/2016 1209   CO2 28 11/10/2016 0750   GLUCOSE 100 11/10/2016 0750   BUN 23.0 11/10/2016 0750   CREATININE 1.7 (H) 11/10/2016 0750   CALCIUM 9.5 11/10/2016 0750   PROT 5.9 (L) 11/10/2016 0750   ALBUMIN 2.9 (L) 11/10/2016 0750   AST 17 11/10/2016 0750   ALT 15 11/10/2016 0750   ALKPHOS 117 11/10/2016 0750  BILITOT 0.38 11/10/2016 0750   GFRNONAA 47 (L) 09/21/2016 1209   GFRAA 55 (L) 09/21/2016 1209   RADIOGRAPHIC STUDIES:  Ct Chest W Contrast  Result Date: 11/20/2016 CLINICAL DATA:  RIGHT lung cancer diagnosed 2018. Chemotherapy radiation therapy complete. EXAM: CT CHEST WITH CONTRAST TECHNIQUE: Multidetector CT imaging of the chest was performed during intravenous contrast administration. CONTRAST:  106m ISOVUE-300 IOPAMIDOL (ISOVUE-300) INJECTION 61% COMPARISON:  PET-CT 07/31/2016 FINDINGS: Cardiovascular: No significant vascular findings. Normal heart size. No pericardial effusion. Pacer leads in the RIGHT heart. Port in the RIGHT chest with tip in distal SVC Mediastinum/Nodes: No axillary supraclavicular adenopathy. Reduction size of mediastinal lymph nodes which were hypermetabolic on comparison PET-CT scan. RIGHT lower paratracheal lymph node measures 13 mm decreased from 23 mm. Subcarinal lymph node measures 11 mm decreased from 24 mm. Thyroid gland extends along the trachea to the level of the origin of the great vessels Lungs/Pleura: RIGHT upper lobe nodule measuring 8 mm (image 28, series 5) decreased in size from 11 mm. This nodule was hypermetabolic on comparison PET-CT scan. No new pulmonary nodules. Upper Abdomen: Limited view of the liver, kidneys, pancreas are unremarkable. Normal adrenal glands. Calculus in the upper pole of the RIGHT kidney Musculoskeletal: No aggressive osseous lesion IMPRESSION: 1. Reduction in size of RIGHT upper lobe pulmonary nodule which was hypermetabolic on  comparison PET-CT scan. 2. Reduction in size of previously hypermetabolic mediastinal lymph nodes. 3. No evidence of progressive disease. Electronically Signed   By: SSuzy BouchardM.D.   On: 11/20/2016 08:33    ASSESSMENT and THERAPY PLAN:   Primary cancer of right upper lobe of lung ([facility] This is a very pleasant 66year old African-American male with a stage IIIa non-small cell lung cancer, adenocarcinoma.  The patient completed 6 weeks of concurrent chemoradiation with weekly carboplatin and paclitaxel and tolerated his treatment well except for odynophagia. He is feeling much better today.   The patient was seen with Dr. MJulien Nordmann CT scan results were reviewed with the patient. Recommend that he proceed with immunotherapy with Imfinzi. Adverse effects of this treatment were discussed including but not limited to immune mediated the skin rash, diarrhea, inflammation of the lung, kidney, liver, thyroid or other endocrine dysfunction. Patient handout was given to Mr. Tutor.  The patient is in agreement with this recommendation and would like to proceed. Treatment may be given up to one year as long as the patient is tolerating it and there is no progression of his cancer. Treatment will given every 2 weeks with plans to start on 11/06/2016.   Plan is for the patient to return in one week for labs and his immunotherapy. He will have a return visit with labs and immunotherapy in 3 weeks.     Orders Placed This Encounter  Procedures  . TSH    Standing Status:   Standing    Number of Occurrences:   8    Standing Expiration Date:   11/27/2017  . CBC with Differential/Platelet    Standing Status:   Standing    Number of Occurrences:   10    Standing Expiration Date:   11/27/2017  . Comprehensive metabolic panel    Standing Status:   Standing    Number of Occurrences:   10    Standing Expiration Date:   11/27/2017    All questions were answered. The patient knows to call the clinic with  any problems, questions or concerns. We can certainly see the patient much sooner  if necessary. Mikey Bussing, NP 11/27/2016   ADDENDUM: Hematology/Oncology Attending: I had a face to face encounter with the patient today. I recommended his care plan. This is a very pleasant 66 years old African-American male with a stage IIIa non-small cell lung cancer status post a course of concurrent chemoradiation. The patient tolerated his treatment well with no concerning complaints except for mild fatigue and odynophagia. He had a recent CT scan of the chest. I personally and independently reviewed the scans and discuss the results with the patient today. His scan showed improvement of his disease. I discussed with the patient with you next treatment options including consolidation treatment with immunotherapy with Imfinzi (Durvalumab) 10 MG/KG every 2 weeks. I discussed with the patient adverse effect of this treatment including but not limited to immunotherapy mediated the skin rash, diarrhea, inflammation of the lung, kidney, liver, thyroid or other endocrine dysfunction. He is expected to start the first cycle of this treatment next week. We will see him back for follow-up visit in 3 weeks for evaluation with the start of cycle #2. He was advised to call immediately if he has any concerning symptoms in the interval.  Disclaimer: This note was dictated with voice recognition software. Similar sounding words can inadvertently be transcribed and may be missed upon review. Eilleen Kempf, MD 11/27/16

## 2016-11-27 NOTE — Telephone Encounter (Signed)
Spoke to patient about upcoming September appointments.

## 2016-11-27 NOTE — Progress Notes (Signed)
Subjective:    Patient ID: Jason Russell, male    DOB: 1950/12/02, 66 y.o.   MRN: [id number]  C.C.:  Follow-up for Severe COPD, Severe OSA, & NSCLC.  HPI Severe COPD: Currently only prescribed albuterol inhaler to use as needed. He reports his breathing is doing "pretty good". He does have dyspnea on exertion. He has used his rescue inhaler a couple of times with some symptomatic benefit. He denies any wheezing or coughing.   Severe OSA: Currently on CPAP therapy with AutoPap 5-20 centimeter H2O. Reported previous AHI 33 events/hour. He reports excellent compliance. He is even napping with it. Reports good quality of sleep. He uses a full face mask.   NSCLC:  Stage IIIA (T1a, N2, M0) non-small cell lung cancer, adenocarcinoma presented with right upper lobe lung nodule in addition to mediastinal lymphadenopathy diagnosed in May 2018. Follows with medical oncology and radiation oncology. Finished his XRT in July.   Review of Systems No chest pressure but does have some mild, "dull ache" in his left chest that is intermittent. He reports this happens sporadically. No fever, chills, or sweats. No abdominal pain, nausea or emesis. He does have some odynophagia at times. Carafate does seem to help.   No Known Allergies  Current Outpatient Prescriptions on File Prior to Visit  Medication Sig Dispense Refill  . acetaminophen (TYLENOL) 500 MG tablet Take 1,000 mg by mouth every 6 (six) hours as needed for mild pain.    [name] albuterol (PROVENTIL HFA;VENTOLIN HFA) 108 (90 Base) MCG/ACT inhaler Inhale 2 puffs into the lungs every 4 (four) hours as needed for wheezing or shortness of breath. 1 Inhaler 2  . amiodarone (PACERONE) 100 MG tablet TAKE 1 TABLET BY MOUTH DAILY 90 tablet 1  . atorvastatin (LIPITOR) 20 MG tablet TAKE 1 TABLET BY MOUTH EVERY DAY 90 tablet 3  . buPROPion (WELLBUTRIN SR) 150 MG 12 hr tablet TAKE 1 TABLET (150 MG TOTAL) BY MOUTH 2 (TWO) TIMES DAILY. (Patient taking differently: Take  150 mg by mouth 2 (two) times daily. ) 180 tablet 2  . carvedilol (COREG) 12.5 MG tablet TAKE 1 TABLET (12.5 MG TOTAL) BY MOUTH 2 (TWO) TIMES DAILY. 180 tablet 3  . colchicine 0.6 MG tablet Take 1 tablet by mouth as needed (gout flares).     . ferrous gluconate (FERGON) 324 MG tablet Take 1 tablet (324 mg total) by mouth 3 (three) times daily with meals. 90 tablet 8  . furosemide (LASIX) 20 MG tablet Take 3 tabs (60 mg) every Tue and Sat, all other days take 2 tabs (40 mg) (Patient taking differently: Take 40-60 mg by mouth See admin instructions. Take 3 tabs (60 mg) every Tue and Sat, all other days take 2 tabs (40 mg)) 64 tablet 6  . hydrALAZINE (APRESOLINE) 100 MG tablet Take 1 tablet by mouth 3 (three) times daily.  1  . levothyroxine (SYNTHROID, LEVOTHROID) 50 MCG tablet TAKE 1 TABLET (50 MCG TOTAL) BY MOUTH DAILY BEFORE BREAKFAST. 90 tablet 3  . lidocaine-prilocaine (EMLA) cream Apply generous amount to port site at least 1 hr prior to treatment.  DO NOT RUB IN. 30 g 0  . lisinopril (PRINIVIL,ZESTRIL) 40 MG tablet TAKE 1 TABLET BY MOUTH DAILY. 90 tablet 2  . Polyvinyl Alcohol-Povidone (REFRESH OP) Apply 1 drop to eye daily.    . potassium chloride SA (KLOR-CON M20) 20 MEQ tablet Take 1 tablet every day.  On days when lasix is taken, take 2 tablets (Patient taking differently:  Take 20 mEq by mouth See admin instructions. Take 1 tablet every day.  On days when lasix is taken, take 2 tablets) 135 tablet 3  . prochlorperazine (COMPAZINE) 10 MG tablet Take 1 tablet (10 mg total) by mouth every 6 (six) hours as needed for nausea or vomiting. 30 tablet 0  . rivaroxaban (XARELTO) 20 MG TABS tablet Take 1 tablet (20 mg total) by mouth daily. 90 tablet 2  . sildenafil (REVATIO) 20 MG tablet Take 1 tablet (20 mg total) by mouth at bedtime. Take 2-3 tablets daily prn for ED 30 tablet 2  . spironolactone (ALDACTONE) 25 MG tablet TAKE 0.5 TABLETS (12.5 MG TOTAL) BY MOUTH DAILY. 45 tablet 2  . sucralfate  (CARAFATE) 1 g tablet Take 1 tablet (1 g total) by mouth 4 (four) times daily -  with meals and at bedtime. 5 min before meals for radiation induced esophagitis 120 tablet 2  . ULORIC 40 MG tablet Take 1 tablet (40 mg total) by mouth daily. 30 tablet 0  . Vitamin D, Ergocalciferol, (DRISDOL) 50000 units CAPS capsule Take 1 capsule by mouth once a week. Monday    . Wound Cleansers (RADIAPLEX EX) Apply topically.     No current facility-administered medications on file prior to visit.     Past Medical History:  Diagnosis Date  . Adenocarcinoma of right lung, stage 3 ([place]) 08/23/2016  . Adenocarcinoma of right lung, stage 3 ([place]) 08/23/2016  . Anemia   . Arthritis    "hx right hip"  . Asthma    "when I was a child"  . Atrial fibrillation ([facility])    Amiodarone started 10/2011; Coumadin  . 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 St. Jude ICD implantation 10/03/12.  . Chronic anticoagulation   . Chronic fatigue 10/18/2016  . Chronic fatigue 10/18/2016  . Chronic systolic heart failure ([place])    a. Echo 7/13: EF 25%;  b. echo 04/2012:  Mild LVH, EF 30-35%, Gr 1 DD, mild AI, mild MR, mild LAE  . COPD (chronic obstructive pulmonary disease) ([place])   . Dyslipidemia   . Dysrhythmia   . Encounter for antineoplastic chemotherapy 08/24/2016  . Gout   . History of blood transfusion 10/15/2013   "don't know where the blood's going; HgB down to 5"  . Hyperlipidemia   . Hypertension   . Hypothyroidism   . NICM (nonischemic cardiomyopathy) ([place])    [place] 4/14:  minimal CAD  . Obesity   . OSA on CPAP   . Tobacco abuse     Past Surgical History:  Procedure Laterality Date  . CARDIAC DEFIBRILLATOR PLACEMENT  2014  . CARDIOVERSION  2011  . COLONOSCOPY WITH PROPOFOL Left 10/17/2013   Procedure: COLONOSCOPY WITH PROPOFOL;  Surgeon: Inda Castle, MD;  Location: [facility];  Service: Endoscopy;  Laterality: Left;  . ESOPHAGOGASTRODUODENOSCOPY N/A 10/17/2013   Procedure:  ESOPHAGOGASTRODUODENOSCOPY (EGD);  Surgeon: Inda Castle, MD;  Location: [facility];  Service: Endoscopy;  Laterality: N/A;  . GIVENS CAPSULE STUDY N/A 10/29/2013   Procedure: GIVENS CAPSULE STUDY;  Surgeon: Inda Castle, MD;  Location: [facility];  Service: Endoscopy;  Laterality: N/A;  . IMPLANTABLE CARDIOVERTER DEFIBRILLATOR IMPLANT Left 10/03/2012   Procedure: IMPLANTABLE CARDIOVERTER DEFIBRILLATOR IMPLANT;  Surgeon: Deboraha Sprang, MD;  Location: [facility] CATH LAB;  Service: Cardiovascular;  Laterality: Left;  . IR FLUORO GUIDE PORT INSERTION RIGHT  09/21/2016  . IR US GUIDE VASC ACCESS RIGHT  09/21/2016  . JOINT REPLACEMENT    .  TONSILLECTOMY  1950's  . TOTAL HIP ARTHROPLASTY Right 11/25/1997  . VIDEO BRONCHOSCOPY WITH ENDOBRONCHIAL ULTRASOUND N/A 08/09/2016   Procedure: VIDEO BRONCHOSCOPY WITH ENDOBRONCHIAL ULTRASOUND;  Surgeon: Javier Glazier, MD;  Location: [facility] OR;  Service: Thoracic;  Laterality: N/A;    Family History  Problem Relation Age of Onset  . Hypertension Mother   . Other Father        deceased- unknow reason  . Lung cancer Brother     Social History   Social History  . Marital status: Married    Spouse name: N/A  . Number of children: 1  . Years of education: N/A   Occupational History  . [employer] - Freight forwarder [employer]   Social History Main Topics  . Smoking status: Former Smoker    Packs/day: 0.25    Years: 45.00    Types: Cigarettes    Quit date: 07/11/2016  . Smokeless tobacco: Never Used  . Alcohol use 0.6 oz/week    1 Cans of beer per week  . Drug use: No  . Sexual activity: Not Currently   Other Topics Concern  . None   Social History Narrative  . None      Objective:   Physical Exam BP 134/66 (BP Location: Right Arm, Cuff Size: Normal)   Pulse 68   Ht 6\' 1"  (1.854 m)   Wt 287 lb (130.2 kg)   SpO2 98%   BMI 37.87 kg/m  General:  Awake. Alert. No acute distress.Central obesity. Integument:  Warm & dry. No rash on  exposed skin. No bruising on exposed skin. Lymphatics: No appreciated cervical or supraclavicular lymphadenopathy. HEENT:  Moist mucus membranes. No oral ulcers. No scleral injection or icterus.  Cardiovascular:  Regular rate. Bilateral lower extremity edema. No appreciable JVD.  Pulmonary:  Good aeration & clear to auscultation bilaterally. Symmetric chest wall expansion. No accessory muscle use on room air. Abdomen: Soft. Normal bowel sounds. Protuberant. Musculoskeletal:  Normal bulk and tone. No joint deformity or effusion appreciated.  PFT 07/31/16: FVC 1.84 L (41%) FEV1 1.14 L (42%) FEV1/FVC 0.77 FEF 25-75 1.17 L (38%) positive bronchodilator response TLC 5.76 L (75%) RV 140% ERV 48% DLCO uncorrected 52%  CPAP DOWNLOAD 07/28-0 11/26/16:  100% usage. AutoSet with minimum pressure 5 cm H2O & maximum pressure 20 cm H2O. Residual AHI 0.8 events/hour. Maximum pressure achieved 18 cm H2O. Median pressure 7.8 cm H2O. Average usage 11 hours 3 minutes.  IMAGING PET CT 07/31/16 (per radiologist):  Markedly hypermetabolic posterior right upper lobe pulmonary lesion consistent with malignancy and hypermetabolic mediastinal lymph node metastases.  PATHOLOGY FNA RUL 09/06/16:  Adenocarcinoma  EBUS 4R & 7 08/09/16:  Adenocarcinoma     Assessment & Plan:  66 y.o. male with severe COPD, severe OSA, and non-small cell lung cancer of the right lung.  Patient's previous spirometry does show severe obstruction likely masked by the restriction seen on his lung volumes as suggested by his FVC. He has excellent compliance and adherence to his CPAP therapy with good symptomatic benefit. I do believe the patient may benefit from a daily maintenance inhaler and he will contact me if the one provided today provide some symptomatic benefit. I instructed the patient contact me if he had any new breathing problems or questions before his next appointment.  1. Severe COPD: Trying patient on Trelegy inhaler. He will contact  me for a prescription if this seems to help his dyspnea. Screening for alpha-1 antitrypsin deficiency today. 2.  Severe OSA: Continuing on CPAP indefinitely. 3. NSCLC: Continuing to follow with medical and radiation oncology. 4. Health maintenance: Status post pneumonia July 2018. S/P influenza vaccine July 2018.  5. Follow-up: Return to clinic in 6 months or sooner if needed.  Sonia Baller Ashok Cordia, M.D. [facility] Pager:  [phone] After 3pm or if no response, call [phone] 9:19 AM 11/27/16

## 2016-11-29 ENCOUNTER — Ambulatory Visit: Payer: Self-pay | Admitting: Urology

## 2016-11-29 LAB — CUP PACEART REMOTE DEVICE CHECK
Battery Remaining Longevity: 63 mo
Implantable Lead Location: 753860
Implantable Pulse Generator Implant Date: 20140703
Lead Channel Setting Pacing Amplitude: 2.5 V
Lead Channel Setting Sensing Sensitivity: 0.5 mV
MDC IDC LEAD IMPLANT DT: 20140703
MDC IDC PG SERIAL: 1105031
MDC IDC SESS DTM: 20180829103541
MDC IDC SET LEADCHNL RV PACING PULSEWIDTH: 0.5 ms

## 2016-11-30 ENCOUNTER — Encounter: Payer: Self-pay | Admitting: Urology

## 2016-11-30 ENCOUNTER — Ambulatory Visit
Admission: RE | Admit: 2016-11-30 | Discharge: 2016-11-30 | Disposition: A | Discharge status: Home / Self Care | Payer: Medicare Other | Source: Ambulatory Visit | Attending: Urology | Admitting: Urology

## 2016-11-30 VITALS — BP 121/71 | HR 80 | Temp 98.6°F | Resp 20 | Wt 283.1 lb

## 2016-11-30 DIAGNOSIS — C3411 Malignant neoplasm of upper lobe, right bronchus or lung: Secondary | ICD-10-CM

## 2016-11-30 DIAGNOSIS — Z923 Personal history of irradiation: Secondary | ICD-10-CM | POA: Insufficient documentation

## 2016-11-30 DIAGNOSIS — Z79899 Other long term (current) drug therapy: Secondary | ICD-10-CM | POA: Insufficient documentation

## 2016-11-30 LAB — ALPHA-1 ANTITRYPSIN PHENOTYPE: A-1 Antitrypsin: 148 mg/dL (ref 83–199)

## 2016-11-30 NOTE — Progress Notes (Signed)
Radiation Oncology         [phone]) [phone] ________________________________  Name: Jason Russell MRN: [id number]  Date: 11/30/2016  DOB: 08-03-1950  Post Treatment Note  CC: Lucianne Lei, MD  Javier Glazier, MD  Diagnosis:   66 y.o. man with clinical stage T1a N2 M0 right upper lung adenocarcinoma - Stage IIIA  Interval Since Last Radiation:  6 weeks  09/05/2016-10/20/2016:   The primary tumor and involved mediastinal adenopathy were treated to 66 Gy in 33 fractions of 2 Gy.  Narrative:  The patient returns today for routine follow-up.  He tolerated radiation treatment relatively well.  He experienced some esophagitis which he characterized as mild as well as modest fatigue.                              On review of systems, the patient states that he is doing well overall. He denies productive cough, hemoptysis, chest pain, fever, chills, night sweats or unintended weight loss. He reports a healthy appetite and is maintaining his weight. He continues with mild dysphagia but feels that this is improving daily. He is able to eat and drink per his usual diet. He reports that his energy level is gradually improving which he is pleased with. He had a recent post-treatment CT chest on 11/20/2016 which shows a good response to treatment with a reduction in size of the right upper lobe pulmonary nodule as well as the mediastinal lymph nodes. There is no evidence of progressive disease. He is scheduled to begin immunotherapy with Imfinzi next week, under the care and direction of Dr. Julien Nordmann.  ALLERGIES:  has No Known Allergies.  Meds: Current Outpatient Prescriptions  Medication Sig Dispense Refill  . acetaminophen (TYLENOL) 500 MG tablet Take 1,000 mg by mouth every 6 (six) hours as needed for mild pain.    [name] amiodarone (PACERONE) 100 MG tablet TAKE 1 TABLET BY MOUTH DAILY 90 tablet 1  . atorvastatin (LIPITOR) 20 MG tablet TAKE 1 TABLET BY MOUTH EVERY DAY 90 tablet 3  . buPROPion (WELLBUTRIN  SR) 150 MG 12 hr tablet TAKE 1 TABLET (150 MG TOTAL) BY MOUTH 2 (TWO) TIMES DAILY. (Patient taking differently: Take 150 mg by mouth 2 (two) times daily. ) 180 tablet 2  . carvedilol (COREG) 12.5 MG tablet TAKE 1 TABLET (12.5 MG TOTAL) BY MOUTH 2 (TWO) TIMES DAILY. 180 tablet 3  . ferrous gluconate (FERGON) 324 MG tablet Take 1 tablet (324 mg total) by mouth 3 (three) times daily with meals. 90 tablet 8  . Fluticasone-Umeclidin-Vilant (TRELEGY ELLIPTA) 100-62.5-25 MCG/INH AEPB Inhale 1 puff into the lungs daily. 1 each 0  . furosemide (LASIX) 20 MG tablet Take 3 tabs (60 mg) every Tue and Sat, all other days take 2 tabs (40 mg) (Patient taking differently: Take 40-60 mg by mouth See admin instructions. Take 3 tabs (60 mg) every Tue and Sat, all other days take 2 tabs (40 mg)) 64 tablet 6  . hydrALAZINE (APRESOLINE) 100 MG tablet Take 1 tablet by mouth 3 (three) times daily.  1  . levothyroxine (SYNTHROID, LEVOTHROID) 50 MCG tablet TAKE 1 TABLET (50 MCG TOTAL) BY MOUTH DAILY BEFORE BREAKFAST. 90 tablet 3  . lidocaine-prilocaine (EMLA) cream Apply generous amount to port site at least 1 hr prior to treatment.  DO NOT RUB IN. 30 g 0  . lisinopril (PRINIVIL,ZESTRIL) 40 MG tablet TAKE 1 TABLET BY MOUTH DAILY. 90 tablet 2  .  Polyvinyl Alcohol-Povidone (REFRESH OP) Apply 1 drop to eye daily.    . potassium chloride SA (KLOR-CON M20) 20 MEQ tablet Take 1 tablet every day.  On days when lasix is taken, take 2 tablets (Patient taking differently: Take 20 mEq by mouth See admin instructions. Take 1 tablet every day.  On days when lasix is taken, take 2 tablets) 135 tablet 3  . prochlorperazine (COMPAZINE) 10 MG tablet Take 1 tablet (10 mg total) by mouth every 6 (six) hours as needed for nausea or vomiting. 30 tablet 0  . rivaroxaban (XARELTO) 20 MG TABS tablet Take 1 tablet (20 mg total) by mouth daily. 90 tablet 2  . sildenafil (REVATIO) 20 MG tablet Take 1 tablet (20 mg total) by mouth at bedtime. Take 2-3  tablets daily prn for ED 30 tablet 2  . spironolactone (ALDACTONE) 25 MG tablet TAKE 0.5 TABLETS (12.5 MG TOTAL) BY MOUTH DAILY. 45 tablet 2  . sucralfate (CARAFATE) 1 g tablet Take 1 tablet (1 g total) by mouth 4 (four) times daily -  with meals and at bedtime. 5 min before meals for radiation induced esophagitis 120 tablet 2  . ULORIC 40 MG tablet Take 1 tablet (40 mg total) by mouth daily. 30 tablet 0  . Vitamin D, Ergocalciferol, (DRISDOL) 50000 units CAPS capsule Take 1 capsule by mouth once a week. Monday    . albuterol (PROVENTIL HFA;VENTOLIN HFA) 108 (90 Base) MCG/ACT inhaler Inhale 2 puffs into the lungs every 4 (four) hours as needed for wheezing or shortness of breath. (Patient not taking: Reported on 11/30/2016) 1 Inhaler 2  . Wound Cleansers (RADIAPLEX EX) Apply topically.     No current facility-administered medications for this encounter.     Physical Findings:  weight is 283 lb 2 oz (128.4 kg). His oral temperature is 98.6 F (37 C). His blood pressure is 121/71 and his pulse is 80. His respiration is 20 and oxygen saturation is 99%.  Pain Assessment Pain Score: 0-No pain/10 In general this is a well appearing African-American male in no acute distress. He's alert and oriented x4 and appropriate throughout the examination. Cardiopulmonary assessment is negative for acute distress and he exhibits normal effort.   Lab Findings: Lab Results  Component Value Date   WBC 3.4 (L) 11/10/2016   HGB 11.4 (L) 11/10/2016   HCT 34.5 (L) 11/10/2016   MCV 89.8 11/10/2016   PLT 88 (L) 11/10/2016     Radiographic Findings: Ct Chest W Contrast  Result Date: 11/20/2016 CLINICAL DATA:  RIGHT lung cancer diagnosed 2018. Chemotherapy radiation therapy complete. EXAM: CT CHEST WITH CONTRAST TECHNIQUE: Multidetector CT imaging of the chest was performed during intravenous contrast administration. CONTRAST:  59mL ISOVUE-300 IOPAMIDOL (ISOVUE-300) INJECTION 61% COMPARISON:  PET-CT 07/31/2016  FINDINGS: Cardiovascular: No significant vascular findings. Normal heart size. No pericardial effusion. Pacer leads in the RIGHT heart. Port in the RIGHT chest with tip in distal SVC Mediastinum/Nodes: No axillary supraclavicular adenopathy. Reduction size of mediastinal lymph nodes which were hypermetabolic on comparison PET-CT scan. RIGHT lower paratracheal lymph node measures 13 mm decreased from 23 mm. Subcarinal lymph node measures 11 mm decreased from 24 mm. Thyroid gland extends along the trachea to the level of the origin of the great vessels Lungs/Pleura: RIGHT upper lobe nodule measuring 8 mm (image 28, series 5) decreased in size from 11 mm. This nodule was hypermetabolic on comparison PET-CT scan. No new pulmonary nodules. Upper Abdomen: Limited view of the liver, kidneys, pancreas are unremarkable. Normal  adrenal glands. Calculus in the upper pole of the RIGHT kidney Musculoskeletal: No aggressive osseous lesion IMPRESSION: 1. Reduction in size of RIGHT upper lobe pulmonary nodule which was hypermetabolic on comparison PET-CT scan. 2. Reduction in size of previously hypermetabolic mediastinal lymph nodes. 3. No evidence of progressive disease. Electronically Signed   By: Suzy Bouchard M.D.   On: 11/20/2016 08:33    Impression/Plan: 1. 66 y.o. man with clinical stage T1a N2 M0 right upper lung adenocarcinoma - Stage IIIA. He appears to be recovering well from the effects of radiotherapy. His recent posttreatment CT chest shows a good response to treatment with a reduction in size of the right upper lobe pulmonary nodule as well as the mediastinal lymph nodes. There is no evidence of progressive disease. He is scheduled to begin immunotherapy with Imfinzi next week. We discussed that while we are happy to continue to participate in his care if clinically indicated, at this point, we will plan to see him on an as-needed basis. He will continue his routine follow-up for disease surveillance and  management under the care and direction of Dr. Julien Nordmann.  His next scheduled follow-up appointment with Dr. Julien Nordmann is on 12/21/2016. He knows to call with any questions or concerns related to his previous radiotherapy. He is in agreement with and is comfortable with this plan.     Nicholos Johns, PA-C

## 2016-12-01 ENCOUNTER — Encounter: Payer: Self-pay | Admitting: Cardiology

## 2016-12-08 ENCOUNTER — Telehealth: Payer: Self-pay | Admitting: Pulmonary Disease

## 2016-12-08 MED ORDER — FLUTICASONE-UMECLIDIN-VILANT 100-62.5-25 MCG/INH IN AEPB: 1.0000 | INHALATION_SPRAY | Freq: Every day | RESPIRATORY_TRACT | 6 refills | Status: DC

## 2016-12-08 NOTE — Telephone Encounter (Signed)
Called and spoke with pt and he is aware of the refill of rx of trelegy that has been sent to the pharmacy.  Nothing further is needed.

## 2016-12-18 LAB — CUP PACEART INCLINIC DEVICE CHECK
Implantable Lead Location: 753860
Implantable Pulse Generator Implant Date: 20140703
MDC IDC LEAD IMPLANT DT: 20140703
MDC IDC PG SERIAL: 1105031
MDC IDC SESS DTM: 20180917150502

## 2016-12-21 ENCOUNTER — Ambulatory Visit (HOSPITAL_BASED_OUTPATIENT_CLINIC_OR_DEPARTMENT_OTHER): Payer: Medicare Other | Admitting: Internal Medicine

## 2016-12-21 ENCOUNTER — Encounter: Payer: Self-pay | Admitting: Internal Medicine

## 2016-12-21 ENCOUNTER — Other Ambulatory Visit: Payer: Self-pay

## 2016-12-21 ENCOUNTER — Telehealth: Payer: Self-pay | Admitting: Internal Medicine

## 2016-12-21 ENCOUNTER — Ambulatory Visit (INDEPENDENT_AMBULATORY_CARE_PROVIDER_SITE_OTHER): Payer: Medicare Other

## 2016-12-21 ENCOUNTER — Ambulatory Visit (HOSPITAL_BASED_OUTPATIENT_CLINIC_OR_DEPARTMENT_OTHER): Payer: Medicare Other

## 2016-12-21 ENCOUNTER — Other Ambulatory Visit (HOSPITAL_BASED_OUTPATIENT_CLINIC_OR_DEPARTMENT_OTHER): Payer: Medicare Other

## 2016-12-21 VITALS — BP 134/67 | HR 71 | Temp 98.7°F | Resp 18 | Wt 286.3 lb

## 2016-12-21 DIAGNOSIS — Z7189 Other specified counseling: Secondary | ICD-10-CM

## 2016-12-21 DIAGNOSIS — Z5112 Encounter for antineoplastic immunotherapy: Secondary | ICD-10-CM

## 2016-12-21 DIAGNOSIS — E039 Hypothyroidism, unspecified: Secondary | ICD-10-CM

## 2016-12-21 DIAGNOSIS — C3411 Malignant neoplasm of upper lobe, right bronchus or lung: Secondary | ICD-10-CM

## 2016-12-21 DIAGNOSIS — I5022 Chronic systolic (congestive) heart failure: Secondary | ICD-10-CM | POA: Diagnosis not present

## 2016-12-21 DIAGNOSIS — R5382 Chronic fatigue, unspecified: Secondary | ICD-10-CM

## 2016-12-21 DIAGNOSIS — Z95810 Presence of automatic (implantable) cardiac defibrillator: Secondary | ICD-10-CM

## 2016-12-21 DIAGNOSIS — C3491 Malignant neoplasm of unspecified part of right bronchus or lung: Secondary | ICD-10-CM

## 2016-12-21 LAB — COMPREHENSIVE METABOLIC PANEL
ALT: 11 U/L (ref 0–55)
AST: 16 U/L (ref 5–34)
Albumin: 3.1 g/dL — ABNORMAL LOW (ref 3.5–5.0)
Alkaline Phosphatase: 124 U/L (ref 40–150)
Anion Gap: 8 mEq/L (ref 3–11)
BUN: 22.8 mg/dL (ref 7.0–26.0)
CHLORIDE: 107 meq/L (ref 98–109)
CO2: 26 meq/L (ref 22–29)
Calcium: 9.5 mg/dL (ref 8.4–10.4)
Creatinine: 1.6 mg/dL — ABNORMAL HIGH (ref 0.7–1.3)
EGFR: 52 mL/min/{1.73_m2} — AB (ref >90)
GLUCOSE: 122 mg/dL (ref 70–140)
POTASSIUM: 3.7 meq/L (ref 3.5–5.1)
SODIUM: 141 meq/L (ref 136–145)
Total Bilirubin: 0.55 mg/dL (ref 0.20–1.20)
Total Protein: 6.9 g/dL (ref 6.4–8.3)

## 2016-12-21 LAB — CBC WITH DIFFERENTIAL/PLATELET
BASO%: 0.3 % (ref 0.0–2.0)
Basophils Absolute: 0 10*3/uL (ref 0.0–0.1)
EOS ABS: 0.1 10*3/uL (ref 0.0–0.5)
EOS%: 1.9 % (ref 0.0–7.0)
HCT: 32.1 % — ABNORMAL LOW (ref 38.4–49.9)
HGB: 10.6 g/dL — ABNORMAL LOW (ref 13.0–17.1)
LYMPH%: 3.1 % — AB (ref 14.0–49.0)
MCH: 30.8 pg (ref 27.2–33.4)
MCHC: 33 g/dL (ref 32.0–36.0)
MCV: 93.6 fL (ref 79.3–98.0)
MONO#: 1.2 10*3/uL — ABNORMAL HIGH (ref 0.1–0.9)
MONO%: 18.8 % — AB (ref 0.0–14.0)
NEUT#: 4.8 10*3/uL (ref 1.5–6.5)
NEUT%: 75.9 % — ABNORMAL HIGH (ref 39.0–75.0)
Platelets: 152 10*3/uL (ref 140–400)
RBC: 3.43 10*6/uL — AB (ref 4.20–5.82)
RDW: 17 % — ABNORMAL HIGH (ref 11.0–14.6)
WBC: 6.3 10*3/uL (ref 4.0–10.3)
lymph#: 0.2 10*3/uL — ABNORMAL LOW (ref 0.9–3.3)

## 2016-12-21 LAB — TSH: TSH: 1.424 m(IU)/L (ref 0.320–4.118)

## 2016-12-21 MED ORDER — SODIUM CHLORIDE 0.9% FLUSH
10.0000 mL | INTRAVENOUS | Status: DC | PRN
  Administered 2016-12-21: 10 mL
  Filled 2016-12-21: qty 10

## 2016-12-21 MED ORDER — DURVALUMAB 500 MG/10ML IV SOLN
10.4000 mg/kg | Freq: Once | INTRAVENOUS | Status: AC
  Administered 2016-12-21: 1360 mg via INTRAVENOUS
  Filled 2016-12-21: qty 7.2

## 2016-12-21 MED ORDER — HEPARIN SOD (PORK) LOCK FLUSH 100 UNIT/ML IV SOLN
500.0000 [IU] | Freq: Once | INTRAVENOUS | Status: AC | PRN
  Administered 2016-12-21: 500 [IU]
  Filled 2016-12-21: qty 5

## 2016-12-21 MED ORDER — SODIUM CHLORIDE 0.9 % IV SOLN
Freq: Once | INTRAVENOUS | Status: AC
  Administered 2016-12-21: 13:00:00 via INTRAVENOUS

## 2016-12-21 MED ORDER — LIDOCAINE-PRILOCAINE 2.5-2.5 % EX CREA: TOPICAL_CREAM | CUTANEOUS | 1 refills | Status: DC

## 2016-12-21 NOTE — Progress Notes (Signed)
[facility] [phone]   [phone]  OFFICE PROGRESS NOTE  Lucianne Lei, MD [address] [place] [address]  DIAGNOSIS: Stage IIIA (T1a, N2, M0) non-small cell lung cancer, adenocarcinoma presented with right upper lobe lung nodule in addition to mediastinal lymphadenopathy diagnosed in March 2018.  Biomarker Findings Tumor Mutational Burden - TMB-Intermediate (8 Muts/Mb) Microsatellite Status - MS-Stable Genomic Findings For a complete list of the genes assayed, please refer to the Appendix. ERBB2 amplification - equivocal? DNMT3A K417f*192 FUBP1 Q40* KEAP1 [id number] TP53 C275F 7 Disease relevant genes with no reportable alterations: EGFR, KRAS, ALK, BRAF, MET, RET, ROS1   PRIOR THERAPY: course of concurrent chemoradiation with weekly carboplatin for AUC of 2 and paclitaxel 45 MG/M2. Status post 6 cycles. Last cycle was given 10/16/2016.  CURRENT THERAPY:  Consolidation treatment with immunotherapy with Imfinzi (Durvalumab) 10 MG/M2 every 2 weeks. First dose 12/21/2016.  INTERVAL HISTORY: MiMartavis Gurney66.o. male  Returns to the clinic today for follow-up visit. The patient is feeling fine today with no specific complaints except for sore throat and he continues treatment with Carafate.He denied having any chest pain, shortness of breath, cough or hemoptysis. He denied having any fever or chills. He has no nausea, vomiting, diarrhea or constipation. He is here today for evaluation before starting the first cycle of consolidation immunotherapy with Imfinzi (Durvalumab).  MEDICAL HISTORY: Past Medical History:  Diagnosis Date  . Adenocarcinoma of right lung, stage 3 (HCLublin5/23/2018  . Adenocarcinoma of right lung, stage 3 (HCEton5/23/2018  . Anemia   . Arthritis    "hx right hip"  . Asthma    "when I was a child"  . Atrial fibrillation (HCC)    Amiodarone started 10/2011; Coumadin  . Automatic implantable  cardioverter-defibrillator in situ 10/03/2012   a. St. Jude ICD implantation 10/03/12.  . Chronic anticoagulation   . Chronic fatigue 10/18/2016  . Chronic fatigue 10/18/2016  . Chronic systolic heart failure (HCPett[facility]   a. Echo 7/13: EF 25%;  b. echo 04/2012:  Mild LVH, EF 30-35%, Gr 1 DD, mild AI, mild MR, mild LAE  . COPD (chronic obstructive pulmonary disease) ([facility]ast[facility]  . Dyslipidemia   . Dysrhythmia   . Encounter for antineoplastic chemotherapy 08/24/2016  . Gout   . History of blood transfusion 10/15/2013   "don't know where the blood's going; HgB down to 5"  . Hyperlipidemia   . Hypertension   . Hypothyroidism   . NICM (nonischemic cardiomyopathy) ([facility]   [facility]/14:  minimal CAD  . Obesity   . OSA on CPAP   . Tobacco abuse     ALLERGIES:  has No Known Allergies.  MEDICATIONS:  Current Outpatient Prescriptions  Medication Sig Dispense Refill  . acetaminophen (TYLENOL) 500 MG tablet Take 1,000 mg by mouth every 6 (six) hours as needed for mild pain.    . Marland Kitchenlbuterol (PROVENTIL HFA;VENTOLIN HFA) 108 (90 Base) MCG/ACT inhaler Inhale 2 puffs into the lungs every 4 (four) hours as needed for wheezing or shortness of breath. 1 Inhaler 2  . amiodarone (PACERONE) 100 MG tablet TAKE 1 TABLET BY MOUTH DAILY 90 tablet 1  . atorvastatin (LIPITOR) 20 MG tablet TAKE 1 TABLET BY MOUTH EVERY DAY 90 tablet 3  . buPROPion (WELLBUTRIN SR) 150 MG 12 hr tablet TAKE 1 TABLET (150 MG TOTAL) BY MOUTH 2 (TWO) TIMES DAILY. (Patient taking differently: Take 150 mg by mouth 2 (two)  times daily. ) 180 tablet 2  . carvedilol (COREG) 12.5 MG tablet TAKE 1 TABLET (12.5 MG TOTAL) BY MOUTH 2 (TWO) TIMES DAILY. 180 tablet 3  . ferrous gluconate (FERGON) 324 MG tablet Take 1 tablet (324 mg total) by mouth 3 (three) times daily with meals. 90 tablet 8  . Fluticasone-Umeclidin-Vilant (TRELEGY ELLIPTA) 100-62.5-25 MCG/INH AEPB Inhale 1 puff into the lungs daily. 1 each 6  . furosemide (LASIX) 20 MG tablet Take 3 tabs (60 mg)  every Tue and Sat, all other days take 2 tabs (40 mg) (Patient taking differently: Take 40-60 mg by mouth See admin instructions. Take 3 tabs (60 mg) every Tue and Sat, all other days take 2 tabs (40 mg)) 64 tablet 6  . hydrALAZINE (APRESOLINE) 100 MG tablet Take 1 tablet by mouth 3 (three) times daily.  1  . levothyroxine (SYNTHROID, LEVOTHROID) 50 MCG tablet TAKE 1 TABLET (50 MCG TOTAL) BY MOUTH DAILY BEFORE BREAKFAST. 90 tablet 3  . lidocaine-prilocaine (EMLA) cream Apply generous amount to port site at least 1 hr prior to treatment.  DO NOT RUB IN. 30 g 0  . lisinopril (PRINIVIL,ZESTRIL) 40 MG tablet TAKE 1 TABLET BY MOUTH DAILY. 90 tablet 2  . Polyvinyl Alcohol-Povidone (REFRESH OP) Apply 1 drop to eye daily.    . potassium chloride SA (KLOR-CON M20) 20 MEQ tablet Take 1 tablet every day.  On days when lasix is taken, take 2 tablets (Patient taking differently: Take 20 mEq by mouth See admin instructions. Take 1 tablet every day.  On days when lasix is taken, take 2 tablets) 135 tablet 3  . prochlorperazine (COMPAZINE) 10 MG tablet Take 1 tablet (10 mg total) by mouth every 6 (six) hours as needed for nausea or vomiting. 30 tablet 0  . rivaroxaban (XARELTO) 20 MG TABS tablet Take 1 tablet (20 mg total) by mouth daily. 90 tablet 2  . sildenafil (REVATIO) 20 MG tablet Take 1 tablet (20 mg total) by mouth at bedtime. Take 2-3 tablets daily prn for ED 30 tablet 2  . spironolactone (ALDACTONE) 25 MG tablet TAKE 0.5 TABLETS (12.5 MG TOTAL) BY MOUTH DAILY. 45 tablet 2  . sucralfate (CARAFATE) 1 g tablet Take 1 tablet (1 g total) by mouth 4 (four) times daily -  with meals and at bedtime. 5 min before meals for radiation induced esophagitis 120 tablet 2  . ULORIC 40 MG tablet Take 1 tablet (40 mg total) by mouth daily. 30 tablet 0  . Vitamin D, Ergocalciferol, (DRISDOL) 50000 units CAPS capsule Take 1 capsule by mouth once a week. Monday    . Wound Cleansers (RADIAPLEX EX) Apply topically.     No  current facility-administered medications for this visit.     SURGICAL HISTORY:  Past Surgical History:  Procedure Laterality Date  . CARDIAC DEFIBRILLATOR PLACEMENT  2014  . CARDIOVERSION  2011  . COLONOSCOPY WITH PROPOFOL Left 10/17/2013   Procedure: COLONOSCOPY WITH PROPOFOL;  Surgeon: Inda Castle, MD;  Location: [facility];  Service: Endoscopy;  Laterality: Left;  . ESOPHAGOGASTRODUODENOSCOPY N/A 10/17/2013   Procedure: ESOPHAGOGASTRODUODENOSCOPY (EGD);  Surgeon: Inda Castle, MD;  Location: [facility];  Service: Endoscopy;  Laterality: N/A;  . GIVENS CAPSULE STUDY N/A 10/29/2013   Procedure: GIVENS CAPSULE STUDY;  Surgeon: Inda Castle, MD;  Location: [facility];  Service: Endoscopy;  Laterality: N/A;  . IMPLANTABLE CARDIOVERTER DEFIBRILLATOR IMPLANT Left 10/03/2012   Procedure: IMPLANTABLE CARDIOVERTER DEFIBRILLATOR IMPLANT;  Surgeon: Deboraha Sprang, MD;  Location:  [facility];  Service: Cardiovascular;  Laterality: Left;  . IR FLUORO GUIDE PORT INSERTION RIGHT  09/21/2016  . IR US GUIDE VASC ACCESS RIGHT  09/21/2016  . JOINT REPLACEMENT    . TONSILLECTOMY  1950's  . TOTAL HIP ARTHROPLASTY Right 11/25/1997  . VIDEO BRONCHOSCOPY WITH ENDOBRONCHIAL ULTRASOUND N/A 08/09/2016   Procedure: VIDEO BRONCHOSCOPY WITH ENDOBRONCHIAL ULTRASOUND;  Surgeon: Javier Glazier, MD;  Location: [facility];  Service: Thoracic;  Laterality: N/A;    REVIEW OF SYSTEMS:  A comprehensive review of systems was negative except for: Ears, nose, mouth, throat, and face: positive for sore throat   PHYSICAL EXAMINATION: General appearance: alert, cooperative and no distress Head: Normocephalic, without obvious abnormality, atraumatic Neck: no adenopathy, no JVD, supple, symmetrical, trachea midline and thyroid not enlarged, symmetric, no tenderness/mass/nodules Lymph nodes: Cervical, supraclavicular, and axillary nodes normal. Resp: clear to auscultation bilaterally Back: symmetric, no curvature. ROM  normal. No CVA tenderness. Cardio: regular rate and rhythm, S1, S2 normal, no murmur, click, rub or gallop GI: soft, non-tender; bowel sounds normal; no masses,  no organomegaly Extremities: extremities normal, atraumatic, no cyanosis or edema  ECOG PERFORMANCE STATUS: 1 - Symptomatic but completely ambulatory  Blood pressure 134/67, pulse 71, temperature 98.7 F (37.1 C), temperature source Oral, resp. rate 18, weight 286 lb 4.8 oz (129.9 kg), SpO2 100 %.  LABORATORY DATA: Lab Results  Component Value Date   WBC 6.3 12/21/2016   HGB 10.6 (L) 12/21/2016   HCT 32.1 (L) 12/21/2016   MCV 93.6 12/21/2016   PLT 152 12/21/2016      Chemistry      Component Value Date/Time   NA 141 12/21/2016 1055   K 3.7 12/21/2016 1055   CL 106 09/21/2016 1209   CO2 26 12/21/2016 1055   BUN 22.8 12/21/2016 1055   CREATININE 1.6 (H) 12/21/2016 1055      Component Value Date/Time   CALCIUM 9.5 12/21/2016 1055   ALKPHOS 124 12/21/2016 1055   AST 16 12/21/2016 1055   ALT 11 12/21/2016 1055   BILITOT 0.55 12/21/2016 1055       RADIOGRAPHIC STUDIES: No results found.  ASSESSMENT AND PLAN:  This is a very pleasant 66 years old African-American male with a stage IIIa non-small cell lung cancer, adenocarcinoma.  The patient completed 6 weeks of concurrent chemoradiation with weekly carboplatin and paclitaxel and tolerated his treatment well except for odynophagia.  He was supposed to start the first cycle of consolidation immunotherapy with Imfinzi (Durvalumab). I reminded the patient of the adverse effect of this treatment including but not limited to immunotherapy deviated skin rash, diarrhea, inflammation of the lung, kidney, liver, thyroid or other endocrine dysfunction.  He is starting the first dose of this treatment today. The patient would come back for follow-up visit in 2 weeks for evaluation before starting cycle #2.  He was advised to call immediately if he has any concerning symptoms  in the interval. The patient voices understanding of current disease status and treatment options and is in agreement with the current care plan. All questions were answered. The patient knows to call the clinic with any problems, questions or concerns. We can certainly see the patient much sooner if necessary. I spent 10 minutes counseling the patient face to face. The total time spent in the appointment was 15 minutes.  Disclaimer: This note was dictated with voice recognition software. Similar sounding words can inadvertently be transcribed and may not be corrected upon review.

## 2016-12-21 NOTE — Progress Notes (Signed)
Pt tolerated infusion well.

## 2016-12-21 NOTE — Telephone Encounter (Signed)
Scheduled appt per 9/20 los - called patient and left message with next appt time and date.

## 2016-12-21 NOTE — Progress Notes (Signed)
EPIC Encounter for ICM Monitoring  Patient Name: Cartrell Bentsen is a 66 y.o. male Date: 12/21/2016 Primary Care Physican: Lucianne Lei, MD Primary Cardiologist:Ross Electrophysiologist: Faustino Congress Weight:unknown      Heart Failure questions reviewed, pt asymptomatic.  Patient is receiving treatment for lung cancer.    Thoracic impedance normal with some abnormal days in last month suggesting fluid accumulation.  Prescribed dosage: Furosemide 20 mg 3 tablets (60 mg total) every Tuesday and Saturday and 40 mg all the other days. Potassium 20 mEq 1 tablet every day and on days when lasix is taken, take 2 tablets  Labs: 12/21/2016 Creatinine 1.2,   BUN 22.8, Potassium 3.7, Sodium 141, EGFR 52 11/10/2016 Creatinine 1.7,   BUN 23,    Potassium 3.6, Sodium 143, EGFR 49 10/16/2016 Creatinine 2.0,   BUN 28.4, Potassium 4.1, Sodium 141, EGFR 40  10/09/2016 Creatinine 1.5,   BUN 25.7, Potassium 4.1, Sodium 143, EGFR 55  10/02/2016 Creatinine 1.6,   BUN 20.4, Potassium 3.9, Sodium 147, EGFR 53  09/25/2016 Creatinine 1.3,   BUN 21,    Potassium 4.1, Sodium 142, EGFR 65  09/21/2016 Creatinine 1.50, BUN 29,    Potassium 3.7, Sodium 143  09/18/2016 Creatinine 1.9,   BUN 25.8, Potassium 3.2, Sodium 144, EGFR 43  09/11/2016 Creatinine 1.6,   BUN 32.9, Potassium 3.7, Sodium 142, EGFR 51  09/05/2016 Creatinine 1.6, BUN 19,   Potassium 3.3, Sodium 145 08/24/2016 Creatinine 1.6, BUN 22,   Potassium 3.4, Sodium 143  Recommendations: No changes.   Encouraged to call for fluid symptoms.  Follow-up plan: ICM clinic phone appointment on 01/22/2017.    Copy of ICM check sent to Dr. Caryl Comes.   3 month ICM trend: 12/21/2016   1 Year ICM trend:      [name], RN 12/21/2016 1:46 PM

## 2016-12-21 NOTE — Progress Notes (Signed)
Ok to treat per MD

## 2016-12-21 NOTE — Patient Instructions (Addendum)
[facility] Discharge Instructions for Patients Receiving Chemotherapy  Today you received the following chemotherapy agents: Durvalumab   To help prevent nausea and vomiting after your treatment, we encourage you to take your nausea medication as directed.    If you develop nausea and vomiting that is not controlled by your nausea medication, call the clinic.   BELOW ARE SYMPTOMS THAT SHOULD BE REPORTED IMMEDIATELY:  *FEVER GREATER THAN 100.5 F  *CHILLS WITH OR WITHOUT FEVER  NAUSEA AND VOMITING THAT IS NOT CONTROLLED WITH YOUR NAUSEA MEDICATION  *UNUSUAL SHORTNESS OF BREATH  *UNUSUAL BRUISING OR BLEEDING  TENDERNESS IN MOUTH AND THROAT WITH OR WITHOUT PRESENCE OF ULCERS  *URINARY PROBLEMS  *BOWEL PROBLEMS  UNUSUAL RASH Items with * indicate a potential emergency and should be followed up as soon as possible.  Feel free to call the clinic you have any questions or concerns. The clinic phone number is [phone].  Please show the [facility] at check-in to the Emergency Department and triage nurse.     Durvalumab injection What is this medicine? DURVALUMAB (dur VAL ue mab) is a monoclonal antibody. It is used to treat urothelial cancer. This medicine may be used for other purposes; ask your health care provider or pharmacist if you have questions. COMMON BRAND NAME(S): IMFINZI What should I tell my health care provider before I take this medicine? They need to know if you have any of these conditions: -diabetes -immune system problems -infection -inflammatory bowel disease -kidney disease -liver disease -lung or breathing disease -lupus -organ transplant -stomach or intestine problems -thyroid disease -an unusual or allergic reaction to durvalumab, other medicines, foods, dyes, or preservatives -pregnant or trying to get pregnant -breast-feeding How should I use this medicine? This medicine is for infusion into a vein. It is given by a  health care professional in a hospital or clinic setting. A special MedGuide will be given to you before each treatment. Be sure to read this information carefully each time. Talk to your pediatrician regarding the use of this medicine in children. Special care may be needed. Overdosage: If you think you have taken too much of this medicine contact a poison control center or emergency room at once. NOTE: This medicine is only for you. Do not share this medicine with others. What if I miss a dose? It is important not to miss your dose. Call your doctor or health care professional if you are unable to keep an appointment. What may interact with this medicine? Interactions have not been studied. This list may not describe all possible interactions. Give your health care provider a list of all the medicines, herbs, non-prescription drugs, or dietary supplements you use. Also tell them if you smoke, drink alcohol, or use illegal drugs. Some items may interact with your medicine. What should I watch for while using this medicine? This drug may make you feel generally unwell. Continue your course of treatment even though you feel ill unless your doctor tells you to stop. You may need blood work done while you are taking this medicine. Do not become pregnant while taking this medicine or for 3 months after stopping it. Women should inform their doctor if they wish to become pregnant or think they might be pregnant. There is a potential for serious side effects to an unborn child. Talk to your health care professional or pharmacist for more information. Do not breast-feed an infant while taking this medicine or for 3 months after stopping it.  What side effects may I notice from receiving this medicine? Side effects that you should report to your doctor or health care professional as soon as possible: -allergic reactions like skin rash, itching or hives, swelling of the face, lips, or tongue -black, tarry  stools -bloody or watery diarrhea -breathing problems -change in emotions or moods -change in sex drive -changes in vision -chest pain or chest tightness -chills -confusion -cough -facial flushing -fever -headache -signs and symptoms of high blood sugar such as dizziness; dry mouth; dry skin; fruity breath; nausea; stomach pain; increased hunger or thirst; increased urination -signs and symptoms of liver injury like dark yellow or brown urine; general ill feeling or flu-like symptoms; light-colored stools; loss of appetite; nausea; right upper belly pain; unusually weak or tired; yellowing of the eyes or skin -stomach pain -trouble passing urine or change in the amount of urine -weight gain or weight loss Side effects that usually do not require medical attention (report these to your doctor or health care professional if they continue or are bothersome): -bone pain -constipation -loss of appetite -muscle pain -nausea -swelling of the ankles, feet, hands -tiredness This list may not describe all possible side effects. Call your doctor for medical advice about side effects. You may report side effects to FDA at 1-800-FDA-1088. Where should I keep my medicine? This drug is given in a hospital or clinic and will not be stored at home. NOTE: This sheet is a summary. It may not cover all possible information. If you have questions about this medicine, talk to your doctor, pharmacist, or health care provider.  2018 Elsevier/Gold Standard (2015-10-22 15:50:36)

## 2017-01-04 ENCOUNTER — Ambulatory Visit (HOSPITAL_BASED_OUTPATIENT_CLINIC_OR_DEPARTMENT_OTHER): Payer: Medicare Other | Admitting: Oncology

## 2017-01-04 ENCOUNTER — Ambulatory Visit: Payer: Medicare Other

## 2017-01-04 ENCOUNTER — Encounter: Payer: Self-pay | Admitting: Oncology

## 2017-01-04 ENCOUNTER — Other Ambulatory Visit (HOSPITAL_BASED_OUTPATIENT_CLINIC_OR_DEPARTMENT_OTHER): Payer: Medicare Other

## 2017-01-04 VITALS — BP 104/76 | HR 98 | Temp 98.2°F | Resp 18 | Ht 73.0 in | Wt 265.3 lb

## 2017-01-04 DIAGNOSIS — C3411 Malignant neoplasm of upper lobe, right bronchus or lung: Secondary | ICD-10-CM

## 2017-01-04 DIAGNOSIS — Z79899 Other long term (current) drug therapy: Secondary | ICD-10-CM | POA: Diagnosis not present

## 2017-01-04 DIAGNOSIS — R634 Abnormal weight loss: Secondary | ICD-10-CM | POA: Diagnosis not present

## 2017-01-04 DIAGNOSIS — Z5112 Encounter for antineoplastic immunotherapy: Secondary | ICD-10-CM

## 2017-01-04 DIAGNOSIS — R5382 Chronic fatigue, unspecified: Secondary | ICD-10-CM

## 2017-01-04 DIAGNOSIS — R5383 Other fatigue: Secondary | ICD-10-CM | POA: Diagnosis not present

## 2017-01-04 LAB — CBC WITH DIFFERENTIAL/PLATELET
BASO%: 0.1 % (ref 0.0–2.0)
Basophils Absolute: 0 10*3/uL (ref 0.0–0.1)
EOS%: 1.6 % (ref 0.0–7.0)
Eosinophils Absolute: 0.1 10*3/uL (ref 0.0–0.5)
HEMATOCRIT: 33.7 % — AB (ref 38.4–49.9)
HGB: 10.8 g/dL — ABNORMAL LOW (ref 13.0–17.1)
LYMPH#: 0.8 10*3/uL — AB (ref 0.9–3.3)
LYMPH%: 11.1 % — AB (ref 14.0–49.0)
MCH: 29.6 pg (ref 27.2–33.4)
MCHC: 32 g/dL (ref 32.0–36.0)
MCV: 92.3 fL (ref 79.3–98.0)
MONO#: 0.6 10*3/uL (ref 0.1–0.9)
MONO%: 8.6 % (ref 0.0–14.0)
NEUT#: 5.5 10*3/uL (ref 1.5–6.5)
NEUT%: 78.6 % — AB (ref 39.0–75.0)
Platelets: 200 10*3/uL (ref 140–400)
RBC: 3.65 10*6/uL — AB (ref 4.20–5.82)
RDW: 15.7 % — ABNORMAL HIGH (ref 11.0–14.6)
WBC: 6.9 10*3/uL (ref 4.0–10.3)

## 2017-01-04 LAB — COMPREHENSIVE METABOLIC PANEL
ALT: 100 U/L — AB (ref 0–55)
AST: 85 U/L — AB (ref 5–34)
Albumin: 2.9 g/dL — ABNORMAL LOW (ref 3.5–5.0)
Alkaline Phosphatase: 230 U/L — ABNORMAL HIGH (ref 40–150)
Anion Gap: 11 mEq/L (ref 3–11)
BUN: 23.5 mg/dL (ref 7.0–26.0)
CHLORIDE: 104 meq/L (ref 98–109)
CO2: 25 meq/L (ref 22–29)
CREATININE: 1.9 mg/dL — AB (ref 0.7–1.3)
Calcium: 10.1 mg/dL (ref 8.4–10.4)
EGFR: 41 mL/min/{1.73_m2} — ABNORMAL LOW (ref >90)
GLUCOSE: 118 mg/dL (ref 70–140)
POTASSIUM: 3.6 meq/L (ref 3.5–5.1)
SODIUM: 140 meq/L (ref 136–145)
Total Bilirubin: 0.46 mg/dL (ref 0.20–1.20)
Total Protein: 7.7 g/dL (ref 6.4–8.3)

## 2017-01-04 LAB — TSH: TSH: 1.628 m[IU]/L (ref 0.320–4.118)

## 2017-01-04 NOTE — Assessment & Plan Note (Signed)
This is a very pleasant 66 year old African-American male with a stage IIIa non-small cell lung cancer, adenocarcinoma.  The patient completed 6 weeks of concurrent chemoradiation with weekly carboplatin and paclitaxel and tolerated his treatment well except for odynophagia.   The patient is status post one cycle of consolidation immunotherapy with Imfinzi (Durvalumab).  Patient was seen with Dr. Julien Nordmann. The patient has not felt well for the past 2 weeks since he received his first dose of Imfinzi. He is having more fatigue and weight loss. It is not clear if the symptoms he is having is related to [place] versus something else going on. Recommend that we hold his Imfinzi today. We will plan to bring him back in 2 weeks to reevaluate him and possibly proceed with cycle 2 if he is feeling better at that time.  Liver enzymes are elevated which could be related to Imfinzi. We will recheck these at his next visit. He was encouraged to eat and drink as much as possible to gain back some of his lost weight.  The patient would come back for follow-up visit in 2 weeks for evaluation before starting cycle #2.   He was advised to call immediately if he has any concerning symptoms in the interval. The patient voices understanding of current disease status and treatment options and is in agreement with the current care plan. All questions were answered. The patient knows to call the clinic with any problems, questions or concerns. We can certainly see the patient much sooner if necessary.

## 2017-01-04 NOTE — Progress Notes (Signed)
[facility] Follow up:    Jason Lei, MD [address] [place] [address]   DIAGNOSIS: Stage IIIA (T1a, N2, M0) non-small cell lung cancer, adenocarcinoma presented with right upper lobe lung nodule in addition to mediastinal lymphadenopathy diagnosed in March 2018.  Biomarker Findings Tumor Mutational Burden - TMB-Intermediate (8 Muts/Mb) Microsatellite Status - MS-Stable Genomic Findings For a complete list of the genes assayed, please refer to the Appendix. ERBB2 amplification - equivocal? DNMT3A K417f*192 FUBP1 Q40* KEAP1 [id number] TP53 C275F 7 Disease relevant genes with no reportable alterations: EGFR, KRAS, ALK, BRAF, MET, RET, ROS1   SUMMARY OF ONCOLOGIC HISTORY: Oncology History   Patient presented with abnormal LDCT screening 07/11/16.     Primary cancer of right upper lobe of lung ([facility]  07/11/2016 Imaging    IMPRESSION: LDCT  Lung-RADS Category 4B, suspicious. Additional imaging evaluation or consultation with pulmonary medicine or thoracic surgery recommended. Right apical pulmonary nodule of volume derived equivalent diameter 13.6 mm. Concurrent mediastinal adenopathy      07/31/2016 Imaging    PET IMPRESSION:  Markedly hypermetabolic posterior right upper lobe pulmonary lesion consistent with malignancy and hypermetabolic mediastinal lymph node metastases.      08/09/2016 Surgery    EBUS Procedure: Level 4R:  1.5 cm lymph node / biopsy performed / ROSE / passes 4 - 6 for cytology & cell block / passes 7-9 for cell block only Level 4L:  No lymph node visualized Level 7:  2 cm lymph node / biopsy performed / ROSE / passes 1 - 3 for cytology & cell block / passes 4 & 5 for cell block only       08/09/2016 Pathology Results    Diagnosis FINE NEEDLE ASPIRATION, EBUS 4R (SPECIMEN 2 OF 2, COLLECTED 08/09/16): MALIGNANT CELLS CONSISTENT WITH ADENOCARCINOMA.       08/23/2016 Initial Diagnosis    Primary cancer of right upper  lobe of lung (HCC)     PRIOR THERAPY: course of concurrent chemoradiation with weekly carboplatin for AUC of 2 and paclitaxel 45 MG/M2. Status post 6 cycles. Last cycle was given 10/16/2016.  CURRENT THERAPY: Consolidation treatment with immunotherapy with Imfinzi (Durvalumab) 10 MG/M2 every 2 weeks. First dose 12/21/2016.  INTERVAL HISTORY: Jason Ten[age] returns for routine follow-up by himself. The patient reports that he has not felt well since receiving his first dose of Imfinzi approximate 2 weeks ago. He has been having more fatigue and has not really been feeling like getting out of the house much. He has had a poor appetite and has lost approximately 20 pounds since his last visit. He denies fevers and chills. Denies chest pain, shortness of breath at rest, cough, hemoptysis. Reports dyspnea on exertion. Denies nausea, vomiting, constipation, diarrhea. The patient has noted that he has been sweating a lot more. Sweating occurs any time of the day. The patient is here for evaluation prior to cycle #2 of his treatment.   Patient Active Problem List   Diagnosis Date Noted  . Encounter for antineoplastic immunotherapy 11/27/2016  . Chronic fatigue 10/18/2016  . Port catheter in place 09/25/2016  . Encounter for antineoplastic chemotherapy 08/24/2016  . Goals of care, counseling/discussion 08/24/2016  . Primary cancer of right upper lobe of lung ([facility]/23/2018  . Lung nodule < 6cm on CT   . Mediastinal adenopathy   . Lung nodule 07/21/2016  . Right rotator cuff tear 06/05/2016  . Obesity 10/07/2015  . COPD (chronic  obstructive pulmonary disease) ([place]) 10/07/2015  . Exertional dyspnea 10/07/2015  . Anemia 10/29/2013  . Internal hemorrhoids without mention of complication 71/09/2692  . Iron deficiency anemia, unspecified 10/16/2013  . Symptomatic anemia 10/15/2013  . Nonischemic cardiomyopathy ([place]) 08/16/2012  . Shortness of breath dyspnea 11/01/2011  . Chronic  anticoagulation 11/01/2011  . Chronic systolic heart failure ([place]) 11/01/2011  . Obstructive sleep apnea 12/20/2009  . HYPERPOTASSEMIA 08/04/2009  . HYPOPOTASSEMIA 07/26/2009  . Hyperlipidemia 07/15/2009  . OVERWEIGHT/OBESITY 07/15/2009  . TOBACCO ABUSE 07/15/2009  . HYPERTENSION, BENIGN 07/15/2009    has No Known Allergies.  MEDICAL HISTORY: Past Medical History:  Diagnosis Date  . Adenocarcinoma of right lung, stage 3 ([place]) 08/23/2016  . Adenocarcinoma of right lung, stage 3 ([place]) 08/23/2016  . Anemia   . Arthritis    "hx right hip"  . Asthma    "when I was a child"  . Atrial fibrillation (HCC)    Amiodarone started 10/2011; Coumadin  . 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 St. Jude ICD implantation 10/03/12.  . Chronic anticoagulation   . Chronic fatigue 10/18/2016  . Chronic fatigue 10/18/2016  . Chronic systolic heart failure ([place])    a. Echo 7/13: EF 25%;  b. echo 04/2012:  Mild LVH, EF 30-35%, Gr 1 DD, mild AI, mild MR, mild LAE  . COPD (chronic obstructive pulmonary disease) ([name])   . Dyslipidemia   . Dysrhythmia   . Encounter for antineoplastic chemotherapy 08/24/2016  . Gout   . History of blood transfusion 10/15/2013   "don't know where the blood's going; HgB down to 5"  . Hyperlipidemia   . Hypertension   . Hypothyroidism   . NICM (nonischemic cardiomyopathy) ([place])    [place] 4/14:  minimal CAD  . Obesity   . OSA on CPAP   . Tobacco abuse     SURGICAL HISTORY: Past Surgical History:  Procedure Laterality Date  . CARDIAC DEFIBRILLATOR PLACEMENT  2014  . CARDIOVERSION  2011  . COLONOSCOPY WITH PROPOFOL Left 10/17/2013   Procedure: COLONOSCOPY WITH PROPOFOL;  Surgeon: Inda Castle, MD;  Location: [facility];  Service: Endoscopy;  Laterality: Left;  . ESOPHAGOGASTRODUODENOSCOPY N/A 10/17/2013   Procedure: ESOPHAGOGASTRODUODENOSCOPY (EGD);  Surgeon: Inda Castle, MD;  Location: [facility];  Service: Endoscopy;  Laterality:  N/A;  . GIVENS CAPSULE STUDY N/A 10/29/2013   Procedure: GIVENS CAPSULE STUDY;  Surgeon: Inda Castle, MD;  Location: [facility];  Service: Endoscopy;  Laterality: N/A;  . IMPLANTABLE CARDIOVERTER DEFIBRILLATOR IMPLANT Left 10/03/2012   Procedure: IMPLANTABLE CARDIOVERTER DEFIBRILLATOR IMPLANT;  Surgeon: Deboraha Sprang, MD;  Location: [facility] CATH LAB;  Service: Cardiovascular;  Laterality: Left;  . IR FLUORO GUIDE PORT INSERTION RIGHT  09/21/2016  . IR US GUIDE VASC ACCESS RIGHT  09/21/2016  . JOINT REPLACEMENT    . TONSILLECTOMY  1950's  . TOTAL HIP ARTHROPLASTY Right 11/25/1997  . VIDEO BRONCHOSCOPY WITH ENDOBRONCHIAL ULTRASOUND N/A 08/09/2016   Procedure: VIDEO BRONCHOSCOPY WITH ENDOBRONCHIAL ULTRASOUND;  Surgeon: Javier Glazier, MD;  Location: [facility];  Service: Thoracic;  Laterality: N/A;    SOCIAL HISTORY: Social History   Social History  . Marital status: Married    Spouse name: N/A  . Number of children: 1  . Years of education: N/A   Occupational History  . [employer] - Freight forwarder [employer]   Social History Main Topics  . Smoking status: Former Smoker    Packs/day: 0.25    Years: 45.00  Types: Cigarettes    Quit date: 07/11/2016  . Smokeless tobacco: Never Used  . Alcohol use 0.6 oz/week    1 Cans of beer per week  . Drug use: No  . Sexual activity: Not Currently   Other Topics Concern  . Not on file   Social History Narrative  . No narrative on file    FAMILY HISTORY: Family History  Problem Relation Age of Onset  . Hypertension Mother   . Other Father        deceased- unknow reason  . Lung cancer Brother     Review of Systems  Constitutional: Positive for appetite change, diaphoresis, fatigue and unexpected weight change. Negative for chills and fever.  HENT:  Negative.   Eyes: Negative.   Respiratory: Negative for cough and hemoptysis.        Denies shortness of breath at rest, but reports dyspnea on exertion.  Cardiovascular:  Negative.   Gastrointestinal: Negative.   Genitourinary: Negative.    Musculoskeletal: Negative.   Skin: Negative.   Neurological: Negative.   Hematological: Negative.   Psychiatric/Behavioral: Negative.       PHYSICAL EXAMINATION  ECOG PERFORMANCE STATUS: 1 - Symptomatic but completely ambulatory  Vitals:   01/04/17 1126  BP: 104/76  Pulse: 98  Resp: 18  Temp: 98.2 F (36.8 C)  SpO2: 99%    Physical Exam  Constitutional: He is oriented to person, place, and time and well-developed, well-nourished, and in no distress. No distress.  HENT:  Head: Normocephalic.  Mouth/Throat: Oropharynx is clear and moist. No oropharyngeal exudate.  Eyes: Conjunctivae are normal. Right eye exhibits no discharge. Left eye exhibits no discharge. No scleral icterus.  Neck: Normal range of motion. Neck supple.  Cardiovascular: Normal rate, regular rhythm, normal heart sounds and intact distal pulses.   Pulmonary/Chest: Effort normal and breath sounds normal. No respiratory distress. He has no wheezes. He has no rales.  Abdominal: Soft. Bowel sounds are normal. He exhibits no distension and no mass. There is no tenderness.  Musculoskeletal: Normal range of motion. He exhibits no edema.  Lymphadenopathy:    He has no cervical adenopathy.  Neurological: He is alert and oriented to person, place, and time. He exhibits normal muscle tone. Gait normal. Coordination normal.  Skin: Skin is warm and dry. No rash noted. He is not diaphoretic. No erythema. No pallor.  Psychiatric: Mood, memory, affect and judgment normal.  Vitals reviewed.   LABORATORY DATA:  CBC    Component Value Date/Time   WBC 6.9 01/04/2017 1108   WBC 2.6 (L) 09/21/2016 1209   RBC 3.65 (L) 01/04/2017 1108   RBC 3.87 (L) 09/21/2016 1209   HGB 10.8 (L) 01/04/2017 1108   HCT 33.7 (L) 01/04/2017 1108   PLT 200 01/04/2017 1108   PLT 182 07/21/2016 1429   MCV 92.3 01/04/2017 1108   MCH 29.6 01/04/2017 1108   MCH 28.7  09/21/2016 1209   MCHC 32.0 01/04/2017 1108   MCHC 32.4 09/21/2016 1209   RDW 15.7 (H) 01/04/2017 1108   LYMPHSABS 0.8 (L) 01/04/2017 1108   MONOABS 0.6 01/04/2017 1108   EOSABS 0.1 01/04/2017 1108   EOSABS 0.1 07/21/2016 1429   BASOSABS 0.0 01/04/2017 1108    CMP     Component Value Date/Time   NA 140 01/04/2017 1108   K 3.6 01/04/2017 1108   CL 106 09/21/2016 1209   CO2 25 01/04/2017 1108   GLUCOSE 118 01/04/2017 1108   BUN 23.5 01/04/2017 1108  CREATININE 1.9 (H) 01/04/2017 1108   CALCIUM 10.1 01/04/2017 1108   PROT 7.7 01/04/2017 1108   ALBUMIN 2.9 (L) 01/04/2017 1108   AST 85 (H) 01/04/2017 1108   ALT 100 (H) 01/04/2017 1108   ALKPHOS 230 (H) 01/04/2017 1108   BILITOT 0.46 01/04/2017 1108   GFRNONAA 47 (L) 09/21/2016 1209   GFRAA 55 (L) 09/21/2016 1209    RADIOGRAPHIC STUDIES:  No results found.   ASSESSMENT and THERAPY PLAN:   Primary cancer of right upper lobe of lung ([place]) This is a very pleasant 66 year old African-American male with a stage IIIa non-small cell lung cancer, adenocarcinoma.  The patient completed 6 weeks of concurrent chemoradiation with weekly carboplatin and paclitaxel and tolerated his treatment well except for odynophagia.   The patient is status post one cycle of consolidation immunotherapy with Imfinzi (Durvalumab).  Patient was seen with Dr. Julien Nordmann. The patient has not felt well for the past 2 weeks since he received his first dose of Imfinzi. He is having more fatigue and weight loss. It is not clear if the symptoms he is having is related to [place] versus something else going on. Recommend that we hold his Imfinzi today. We will plan to bring him back in 2 weeks to reevaluate him and possibly proceed with cycle 2 if he is feeling better at that time.  Liver enzymes are elevated which could be related to Imfinzi. We will recheck these at his next visit. He was encouraged to eat and drink as much as possible to gain back some of his  lost weight.  The patient would come back for follow-up visit in 2 weeks for evaluation before starting cycle #2.   He was advised to call immediately if he has any concerning symptoms in the interval. The patient voices understanding of current disease status and treatment options and is in agreement with the current care plan. All questions were answered. The patient knows to call the clinic with any problems, questions or concerns. We can certainly see the patient much sooner if necessary.   No orders of the defined types were placed in this encounter.   All questions were answered. The patient knows to call the clinic with any problems, questions or concerns. We can certainly see the patient much sooner if necessary.  Mikey Bussing, NP 01/04/2017   ADDENDUM: Hematology/Oncology Attending: I had a face to face encounter with the patient. I recommended his care plan. This is a very pleasant 66 years old African-American male with a stage IIIa non-small cell lung cancer status post a course of concurrent chemoradiation with weekly carboplatin and paclitaxel. The patient was restarted on consolidation treatment with immunotherapy with Imfinzi (Durvalumab) status post 1 cycle. He was supposed to start cycle #2 today. The patient mentions that since the start of the first dose he has been complaining of increasing fatigue and weakness as well as lack of energy. His liver enzymes are elevated today. This could be secondary to his treatment with Imfinzi (Durvalumab). I recommended for the patient to hold his treatment with Imfinzi (Durvalumab) for this cycle. We will see him back for follow-up visit in 2 weeks for reevaluation before resuming his treatment. The patient was advised to call immediately if he has any concerning symptoms in the interval.  Disclaimer: This note was dictated with voice recognition software. Similar sounding words can inadvertently be transcribed and may be missed upon  review. Eilleen Kempf, MD 01/06/17

## 2017-01-05 ENCOUNTER — Telehealth: Payer: Self-pay

## 2017-01-05 NOTE — Telephone Encounter (Signed)
Patient called reporting that he received a shock from his defibrillator. Walked patient through how to send a remote transmission which was successfully received. Reviewed episode with industry. Episode appears to be afib with rvr. Patient denies any symptoms at time of event other than notable shock stating that his wife had fallen and he was attempting to help her up. He reports taking all medication as prescribed. I advised patient to go to the ED over the weekend if he receives any additional shocks. I will review episode with a physician and update patient with any additional recommendations on Monday. Patient verbalized understanding.

## 2017-01-07 ENCOUNTER — Observation Stay (HOSPITAL_COMMUNITY)
Admission: EM | Admit: 2017-01-07 | Discharge: 2017-01-09 | Disposition: A | Discharge status: Home / Self Care | Payer: Medicare Other | Attending: Internal Medicine | Admitting: Internal Medicine

## 2017-01-07 ENCOUNTER — Emergency Department (HOSPITAL_COMMUNITY): Payer: Medicare Other

## 2017-01-07 ENCOUNTER — Encounter (HOSPITAL_COMMUNITY): Payer: Self-pay | Admitting: Emergency Medicine

## 2017-01-07 DIAGNOSIS — Z9221 Personal history of antineoplastic chemotherapy: Secondary | ICD-10-CM | POA: Insufficient documentation

## 2017-01-07 DIAGNOSIS — I110 Hypertensive heart disease with heart failure: Secondary | ICD-10-CM | POA: Insufficient documentation

## 2017-01-07 DIAGNOSIS — M109 Gout, unspecified: Secondary | ICD-10-CM | POA: Insufficient documentation

## 2017-01-07 DIAGNOSIS — R5383 Other fatigue: Secondary | ICD-10-CM | POA: Diagnosis not present

## 2017-01-07 DIAGNOSIS — R634 Abnormal weight loss: Secondary | ICD-10-CM | POA: Diagnosis not present

## 2017-01-07 DIAGNOSIS — Z79899 Other long term (current) drug therapy: Secondary | ICD-10-CM | POA: Diagnosis not present

## 2017-01-07 DIAGNOSIS — I700 Atherosclerosis of aorta: Secondary | ICD-10-CM | POA: Insufficient documentation

## 2017-01-07 DIAGNOSIS — Z95810 Presence of automatic (implantable) cardiac defibrillator: Secondary | ICD-10-CM | POA: Diagnosis not present

## 2017-01-07 DIAGNOSIS — I5022 Chronic systolic (congestive) heart failure: Secondary | ICD-10-CM | POA: Diagnosis present

## 2017-01-07 DIAGNOSIS — Z87891 Personal history of nicotine dependence: Secondary | ICD-10-CM | POA: Insufficient documentation

## 2017-01-07 DIAGNOSIS — C3410 Malignant neoplasm of upper lobe, unspecified bronchus or lung: Secondary | ICD-10-CM | POA: Diagnosis not present

## 2017-01-07 DIAGNOSIS — E039 Hypothyroidism, unspecified: Secondary | ICD-10-CM | POA: Diagnosis not present

## 2017-01-07 DIAGNOSIS — Z923 Personal history of irradiation: Secondary | ICD-10-CM | POA: Insufficient documentation

## 2017-01-07 DIAGNOSIS — R9431 Abnormal electrocardiogram [ECG] [EKG]: Secondary | ICD-10-CM | POA: Diagnosis not present

## 2017-01-07 DIAGNOSIS — R5382 Chronic fatigue, unspecified: Secondary | ICD-10-CM | POA: Diagnosis present

## 2017-01-07 DIAGNOSIS — G4733 Obstructive sleep apnea (adult) (pediatric): Secondary | ICD-10-CM | POA: Insufficient documentation

## 2017-01-07 DIAGNOSIS — R748 Abnormal levels of other serum enzymes: Secondary | ICD-10-CM | POA: Diagnosis present

## 2017-01-07 DIAGNOSIS — I4891 Unspecified atrial fibrillation: Secondary | ICD-10-CM | POA: Diagnosis present

## 2017-01-07 DIAGNOSIS — R531 Weakness: Secondary | ICD-10-CM | POA: Diagnosis not present

## 2017-01-07 DIAGNOSIS — Z4502 Encounter for adjustment and management of automatic implantable cardiac defibrillator: Secondary | ICD-10-CM | POA: Diagnosis present

## 2017-01-07 DIAGNOSIS — E876 Hypokalemia: Secondary | ICD-10-CM | POA: Insufficient documentation

## 2017-01-07 DIAGNOSIS — R778 Other specified abnormalities of plasma proteins: Secondary | ICD-10-CM

## 2017-01-07 DIAGNOSIS — C3411 Malignant neoplasm of upper lobe, right bronchus or lung: Secondary | ICD-10-CM | POA: Diagnosis present

## 2017-01-07 DIAGNOSIS — R0600 Dyspnea, unspecified: Secondary | ICD-10-CM | POA: Diagnosis not present

## 2017-01-07 DIAGNOSIS — M1990 Unspecified osteoarthritis, unspecified site: Secondary | ICD-10-CM | POA: Insufficient documentation

## 2017-01-07 DIAGNOSIS — J449 Chronic obstructive pulmonary disease, unspecified: Secondary | ICD-10-CM | POA: Insufficient documentation

## 2017-01-07 DIAGNOSIS — R404 Transient alteration of awareness: Secondary | ICD-10-CM | POA: Diagnosis not present

## 2017-01-07 DIAGNOSIS — E860 Dehydration: Secondary | ICD-10-CM

## 2017-01-07 DIAGNOSIS — E669 Obesity, unspecified: Secondary | ICD-10-CM | POA: Insufficient documentation

## 2017-01-07 DIAGNOSIS — E785 Hyperlipidemia, unspecified: Secondary | ICD-10-CM | POA: Insufficient documentation

## 2017-01-07 DIAGNOSIS — R7989 Other specified abnormal findings of blood chemistry: Secondary | ICD-10-CM

## 2017-01-07 DIAGNOSIS — T82198A Other mechanical complication of other cardiac electronic device, initial encounter: Secondary | ICD-10-CM | POA: Diagnosis not present

## 2017-01-07 DIAGNOSIS — Z7901 Long term (current) use of anticoagulants: Secondary | ICD-10-CM | POA: Insufficient documentation

## 2017-01-07 LAB — CBC WITH DIFFERENTIAL/PLATELET
BASOS ABS: 0 10*3/uL (ref 0.0–0.1)
Basophils Relative: 0 %
EOS PCT: 2 %
Eosinophils Absolute: 0.1 10*3/uL (ref 0.0–0.7)
HEMATOCRIT: 31.8 % — AB (ref 39.0–52.0)
Hemoglobin: 10.1 g/dL — ABNORMAL LOW (ref 13.0–17.0)
LYMPHS ABS: 0.8 10*3/uL (ref 0.7–4.0)
LYMPHS PCT: 13 %
MCH: 28.8 pg (ref 26.0–34.0)
MCHC: 31.8 g/dL (ref 30.0–36.0)
MCV: 90.6 fL (ref 78.0–100.0)
MONO ABS: 0.6 10*3/uL (ref 0.1–1.0)
MONOS PCT: 9 %
NEUTROS ABS: 4.5 10*3/uL (ref 1.7–7.7)
Neutrophils Relative %: 76 %
PLATELETS: 184 10*3/uL (ref 150–400)
RBC: 3.51 MIL/uL — ABNORMAL LOW (ref 4.22–5.81)
RDW: 15.9 % — AB (ref 11.5–15.5)
WBC: 5.9 10*3/uL (ref 4.0–10.5)

## 2017-01-07 LAB — COMPREHENSIVE METABOLIC PANEL
ALT: 65 U/L — ABNORMAL HIGH (ref 17–63)
ANION GAP: 9 (ref 5–15)
AST: 46 U/L — ABNORMAL HIGH (ref 15–41)
Albumin: 2.8 g/dL — ABNORMAL LOW (ref 3.5–5.0)
Alkaline Phosphatase: 166 U/L — ABNORMAL HIGH (ref 38–126)
BILIRUBIN TOTAL: 0.7 mg/dL (ref 0.3–1.2)
BUN: 16 mg/dL (ref 6–20)
CALCIUM: 9 mg/dL (ref 8.9–10.3)
CHLORIDE: 101 mmol/L (ref 101–111)
CO2: 26 mmol/L (ref 22–32)
CREATININE: 1.62 mg/dL — AB (ref 0.61–1.24)
GFR, EST AFRICAN AMERICAN: 50 mL/min — AB (ref >60)
GFR, EST NON AFRICAN AMERICAN: 43 mL/min — AB (ref >60)
Glucose, Bld: 113 mg/dL — ABNORMAL HIGH (ref 65–99)
POTASSIUM: 3.5 mmol/L (ref 3.5–5.1)
Sodium: 136 mmol/L (ref 135–145)
TOTAL PROTEIN: 6.9 g/dL (ref 6.5–8.1)

## 2017-01-07 LAB — LIPASE, BLOOD: Lipase: 25 U/L (ref 11–51)

## 2017-01-07 LAB — MAGNESIUM: Magnesium: 1.6 mg/dL — ABNORMAL LOW (ref 1.7–2.4)

## 2017-01-07 LAB — BRAIN NATRIURETIC PEPTIDE: B NATRIURETIC PEPTIDE 5: 381.1 pg/mL — AB (ref 0.0–100.0)

## 2017-01-07 LAB — TROPONIN I: Troponin I: 0.05 ng/mL (ref <0.03)

## 2017-01-07 MED ORDER — POTASSIUM CHLORIDE CRYS ER 20 MEQ PO TBCR
40.0000 meq | EXTENDED_RELEASE_TABLET | Freq: Once | ORAL | Status: AC
  Administered 2017-01-07: 40 meq via ORAL
  Filled 2017-01-07: qty 2

## 2017-01-07 MED ORDER — SODIUM CHLORIDE 0.9% FLUSH
10.0000 mL | INTRAVENOUS | Status: DC | PRN
  Administered 2017-01-07: 20 mL
  Administered 2017-01-09: 10 mL
  Filled 2017-01-07: qty 40

## 2017-01-07 MED ORDER — SODIUM CHLORIDE 0.9 % IV BOLUS (SEPSIS)
1000.0000 mL | Freq: Once | INTRAVENOUS | Status: AC
  Administered 2017-01-07: 1000 mL via INTRAVENOUS

## 2017-01-07 MED ORDER — MAGNESIUM SULFATE IN D5W 1-5 GM/100ML-% IV SOLN
1.0000 g | Freq: Once | INTRAVENOUS | Status: AC
  Administered 2017-01-07: 1 g via INTRAVENOUS
  Filled 2017-01-07: qty 100

## 2017-01-07 NOTE — ED Provider Notes (Signed)
[facility] DEPT Provider Note   CSN: [id number] Arrival date & time: 01/07/17  1906     History   Chief Complaint Chief Complaint  Patient presents with  . defibrillator fired friday  . Weight Loss    HPI Jason Russell is a 66 y.o. male.     The history is provided by the patient, medical records and a relative.  Illness  This is a new problem. The current episode started more than 1 week ago. The problem occurs constantly. The problem has been gradually worsening. Associated symptoms include shortness of breath ( ). Pertinent negatives include no chest pain, no abdominal pain and no headaches. Nothing aggravates the symptoms. Nothing relieves the symptoms. He has tried nothing for the symptoms. The treatment provided no relief.    Past Medical History:  Diagnosis Date  . Adenocarcinoma of right lung, stage 3 ([place]) 08/23/2016  . Adenocarcinoma of right lung, stage 3 ([place]) 08/23/2016  . Anemia   . Arthritis    "hx right hip"  . Asthma    "when I was a child"  . Atrial fibrillation (HCC)    Amiodarone started 10/2011; Coumadin  . 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 St. Jude ICD implantation 10/03/12.  . Chronic anticoagulation   . Chronic fatigue 10/18/2016  . Chronic fatigue 10/18/2016  . Chronic systolic heart failure ([place])    a. Echo 7/13: EF 25%;  b. echo 04/2012:  Mild LVH, EF 30-35%, Gr 1 DD, mild AI, mild MR, mild LAE  . COPD (chronic obstructive pulmonary disease) ([place])   . Dyslipidemia   . Dysrhythmia   . Encounter for antineoplastic chemotherapy 08/24/2016  . Gout   . History of blood transfusion 10/15/2013   "don't know where the blood's going; HgB down to 5"  . Hyperlipidemia   . Hypertension   . Hypothyroidism   . NICM (nonischemic cardiomyopathy) ([place])    [place] [facility] 4/14:  minimal CAD  . Obesity   . OSA on CPAP   . Tobacco abuse     Patient Active Problem List   Diagnosis Date Noted  . Encounter for antineoplastic  immunotherapy 11/27/2016  . Chronic fatigue 10/18/2016  . Port catheter in place 09/25/2016  . Encounter for antineoplastic chemotherapy 08/24/2016  . Goals of care, counseling/discussion 08/24/2016  . Primary cancer of right upper lobe of lung ([place]) 08/23/2016  . Lung nodule < 6cm on CT   . Mediastinal adenopathy   . Lung nodule 07/21/2016  . Right rotator cuff tear 06/05/2016  . Obesity 10/07/2015  . COPD (chronic obstructive pulmonary disease) ([place]) 10/07/2015  . Exertional dyspnea 10/07/2015  . Anemia 10/29/2013  . Internal hemorrhoids without mention of complication 64/33/2951  . Iron deficiency anemia, unspecified 10/16/2013  . Symptomatic anemia 10/15/2013  . Nonischemic cardiomyopathy ([place]) 08/16/2012  . Shortness of breath dyspnea 11/01/2011  . Chronic anticoagulation 11/01/2011  . Chronic systolic heart failure ([place]) 11/01/2011  . Obstructive sleep apnea 12/20/2009  . HYPERPOTASSEMIA 08/04/2009  . HYPOPOTASSEMIA 07/26/2009  . Hyperlipidemia 07/15/2009  . OVERWEIGHT/OBESITY 07/15/2009  . TOBACCO ABUSE 07/15/2009  . HYPERTENSION, BENIGN 07/15/2009    Past Surgical History:  Procedure Laterality Date  . CARDIAC DEFIBRILLATOR PLACEMENT  2014  . CARDIOVERSION  2011  . COLONOSCOPY WITH PROPOFOL Left 10/17/2013   Procedure: COLONOSCOPY WITH PROPOFOL;  Surgeon: Inda Castle, MD;  Location: [facility];  Service: Endoscopy;  Laterality: Left;  . ESOPHAGOGASTRODUODENOSCOPY N/A 10/17/2013   Procedure: ESOPHAGOGASTRODUODENOSCOPY (EGD);  Surgeon: Sandy Salaam  Deatra Ina, MD;  Location: [facility];  Service: Endoscopy;  Laterality: N/A;  . GIVENS CAPSULE STUDY N/A 10/29/2013   Procedure: GIVENS CAPSULE STUDY;  Surgeon: Inda Castle, MD;  Location: [facility];  Service: Endoscopy;  Laterality: N/A;  . IMPLANTABLE CARDIOVERTER DEFIBRILLATOR IMPLANT Left 10/03/2012   Procedure: IMPLANTABLE CARDIOVERTER DEFIBRILLATOR IMPLANT;  Surgeon: Deboraha Sprang, MD;  Location: [facility] CATH LAB;   Service: Cardiovascular;  Laterality: Left;  . IR FLUORO GUIDE PORT INSERTION RIGHT  09/21/2016  . IR US GUIDE VASC ACCESS RIGHT  09/21/2016  . JOINT REPLACEMENT    . TONSILLECTOMY  1950's  . TOTAL HIP ARTHROPLASTY Right 11/25/1997  . VIDEO BRONCHOSCOPY WITH ENDOBRONCHIAL ULTRASOUND N/A 08/09/2016   Procedure: VIDEO BRONCHOSCOPY WITH ENDOBRONCHIAL ULTRASOUND;  Surgeon: Javier Glazier, MD;  Location: [facility] OR;  Service: Thoracic;  Laterality: N/A;       Home Medications    Prior to Admission medications   Medication Sig Start Date End Date Taking? Authorizing Provider  acetaminophen (TYLENOL) 500 MG tablet Take 1,000 mg by mouth every 6 (six) hours as needed for mild pain.    [provider]  albuterol (PROVENTIL HFA;VENTOLIN HFA) 108 (90 Base) MCG/ACT inhaler Inhale 2 puffs into the lungs every 4 (four) hours as needed for wheezing or shortness of breath. 08/18/16   de Dios, [place] A, MD  amiodarone (PACERONE) 100 MG tablet TAKE 1 TABLET BY MOUTH DAILY 11/01/16   Fay Records, MD  atorvastatin (LIPITOR) 20 MG tablet TAKE 1 TABLET BY MOUTH EVERY DAY 05/25/16   Fay Records, MD  buPROPion [facility] SR) 150 MG 12 hr tablet TAKE 1 TABLET (150 MG TOTAL) BY MOUTH 2 (TWO) TIMES DAILY. Patient taking differently: Take 150 mg by mouth 2 (two) times daily.  10/08/15   Fay Records, MD  carvedilol (COREG) 12.5 MG tablet TAKE 1 TABLET (12.5 MG TOTAL) BY MOUTH 2 (TWO) TIMES DAILY. 07/20/16   Deboraha Sprang, MD  ferrous gluconate (FERGON) 324 MG tablet Take 1 tablet (324 mg total) by mouth 3 (three) times daily with meals. 05/12/16   Deboraha Sprang, MD  Fluticasone-Umeclidin-Vilant (TRELEGY ELLIPTA) 100-62.5-25 MCG/INH AEPB Inhale 1 puff into the lungs daily. 12/08/16   Javier Glazier, MD  furosemide (LASIX) 20 MG tablet Take 3 tabs (60 mg) every Tue and Sat, all other days take 2 tabs (40 mg) Patient taking differently: Take 40-60 mg by mouth See admin instructions. Take 3 tabs (60 mg) every  Tue and Sat, all other days take 2 tabs (40 mg) 07/25/16   Fay Records, MD  hydrALAZINE (APRESOLINE) 100 MG tablet Take 1 tablet by mouth 3 (three) times daily. 11/19/14   [provider]  levothyroxine (SYNTHROID, LEVOTHROID) 50 MCG tablet TAKE 1 TABLET (50 MCG TOTAL) BY MOUTH DAILY BEFORE BREAKFAST. 10/23/16   Fay Records, MD  lidocaine-prilocaine (EMLA) cream Apply generous amount to port site at least 1 hr prior to treatment.  DO NOT RUB IN. 12/21/16   Curt Bears, MD  lisinopril (PRINIVIL,ZESTRIL) 40 MG tablet TAKE 1 TABLET BY MOUTH DAILY. 09/21/16   Fay Records, MD  Polyvinyl Alcohol-Povidone (REFRESH OP) Apply 1 drop to eye daily.    [provider]  potassium chloride SA (KLOR-CON M20) 20 MEQ tablet Take 1 tablet every day.  On days when lasix is taken, take 2 tablets Patient taking differently: Take 20 mEq by mouth See admin instructions. Take 1 tablet every day.  On days  when lasix is taken, take 2 tablets 04/13/16   Fay Records, MD  prochlorperazine (COMPAZINE) 10 MG tablet Take 1 tablet (10 mg total) by mouth every 6 (six) hours as needed for nausea or vomiting. 08/24/16   Curt Bears, MD  rivaroxaban (XARELTO) 20 MG TABS tablet Take 1 tablet (20 mg total) by mouth daily. 08/11/16   Javier Glazier, MD  sildenafil (REVATIO) 20 MG tablet Take 1 tablet (20 mg total) by mouth at bedtime. Take 2-3 tablets daily prn for ED 08/05/15   Fay Records, MD  spironolactone (ALDACTONE) 25 MG tablet TAKE 0.5 TABLETS (12.5 MG TOTAL) BY MOUTH DAILY. 09/21/16   Fay Records, MD  sucralfate (CARAFATE) 1 g tablet Take 1 tablet (1 g total) by mouth 4 (four) times daily -  with meals and at bedtime. 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 10/17/16   Deboraha Sprang, MD  Vitamin D, Ergocalciferol, (DRISDOL) 50000 units CAPS capsule Take 1 capsule by mouth once a week. Monday  06/10/15   [provider]  Wound Cleansers (RADIAPLEX EX) Apply topically.    [provider]    Family History Family History  Problem Relation Age of Onset  . Hypertension Mother   . Other Father        deceased- unknow reason  . Lung cancer Brother     Social History Social History  Substance Use Topics  . Smoking status: Former Smoker    Packs/day: 0.25    Years: 45.00    Types: Cigarettes    Quit date: 07/11/2016  . Smokeless tobacco: Never Used  . Alcohol use 0.6 oz/week    1 Cans of beer per week     Allergies   Patient has no known allergies.   Review of Systems Review of Systems  Constitutional: Positive for chills and fatigue. Negative for diaphoresis and fever.  HENT: Negative for congestion.   Respiratory: Positive for shortness of breath ( ). Negative for cough, choking, chest tightness, wheezing and stridor.   Cardiovascular: Negative for chest pain, palpitations and leg swelling.  Gastrointestinal: Negative for abdominal pain, constipation, diarrhea, nausea and vomiting.  Genitourinary: Negative for dysuria and flank pain.  Musculoskeletal: Negative for back pain, neck pain and neck stiffness.  Skin: Negative for rash and wound.  Neurological: Positive for light-headedness. Negative for dizziness and headaches.  Psychiatric/Behavioral: Negative for agitation.  All other systems reviewed and are negative.    Physical Exam Updated Vital Signs BP 120/80 (BP Location: Right Arm)   Temp 98.2 F (36.8 C) (Oral)   Resp 16   Ht 6\' 1"  (1.854 m)   Wt 120.2 kg (265 lb)   SpO2 97%   BMI 34.96 kg/m   Physical Exam  Constitutional: He is oriented to person, place, and time. He appears well-developed and well-nourished. No distress.  HENT:  Head: Normocephalic.  Mouth/Throat: Oropharynx is clear and moist. No oropharyngeal exudate.  Eyes: Pupils are equal, round, and reactive to light. Conjunctivae and EOM are normal.  Neck: Normal  range of motion.  Cardiovascular: Normal rate and intact distal pulses.   No murmur heard. Pulmonary/Chest: Effort normal and breath sounds normal. No stridor. No respiratory distress. He has no wheezes. He has no rales. He exhibits no tenderness.  Abdominal: Soft. He exhibits no distension.  Musculoskeletal: He exhibits no edema or tenderness.  Neurological: He is alert and oriented to person, place, and time. No cranial nerve deficit.  Skin: Capillary refill takes less than 2 seconds. He is not diaphoretic. No erythema. No pallor.  Psychiatric: He has a normal mood and affect.  Nursing note and vitals reviewed.    ED Treatments / Results  Labs (all labs ordered are listed, but only abnormal results are displayed) Labs Reviewed  CBC WITH DIFFERENTIAL/PLATELET - Abnormal; Notable for the following:       Result Value   RBC 3.51 (*)    Hemoglobin 10.1 (*)    HCT 31.8 (*)    RDW 15.9 (*)    All other components within normal limits  COMPREHENSIVE METABOLIC PANEL - Abnormal; Notable for the following:    Glucose, Bld 113 (*)    Creatinine, Ser 1.62 (*)    Albumin 2.8 (*)    AST 46 (*)    ALT 65 (*)    Alkaline Phosphatase 166 (*)    GFR calc non Af Amer 43 (*)    GFR calc Af Amer 50 (*)    All other components within normal limits  MAGNESIUM - Abnormal; Notable for the following:    Magnesium 1.6 (*)    All other components within normal limits  BRAIN NATRIURETIC PEPTIDE - Abnormal; Notable for the following:    B Natriuretic Peptide 381.1 (*)    All other components within normal limits  TROPONIN I - Abnormal; Notable for the following:    Troponin I 0.05 (*)    All other components within normal limits  URINE CULTURE  LIPASE, BLOOD  URINALYSIS, ROUTINE W REFLEX MICROSCOPIC  BASIC METABOLIC PANEL  HIV ANTIBODY (ROUTINE TESTING)    EKG  EKG Interpretation  Date/Time:  Sunday January 07 2017 19:17:50 EDT Ventricular Rate:  82 PR Interval:    QRS Duration: 114 QT  Interval:  433 QTC Calculation: 506 R Axis:   -2 Text Interpretation:  Sinus rhythm Probable left atrial enlargement Left ventricular hypertrophy Prolonged QT interval When compared to prior no significant changes seen,.  No STEMI Confirmed by Antony Blackbird [phone]) on 01/07/2017 7:51:21 PM       Radiology Dg Chest 2 View  Result Date: 01/07/2017 CLINICAL DATA:  Dyspnea and fatigue for several days. Defibrillator fired. EXAM: CHEST  2 VIEW COMPARISON:  11/20/2016, 09/06/2016 FINDINGS: Right jugular port appears satisfactorily positioned. Transvenous cardiac leads appear grossly intact. No airspace consolidation. No effusions. Normal pulmonary vasculature. Hilar and mediastinal contours are unremarkable and unchanged. IMPRESSION: No acute cardiopulmonary findings. Electronically Signed   By: Andreas Newport M.D.   On: 01/07/2017 21:43    Procedures Procedures (including critical care time)  Medications Ordered in ED Medications  sodium chloride flush (NS) 0.9 % injection 10-40 mL (20 mLs Intracatheter Given 01/07/17 2049)  acetaminophen (TYLENOL) tablet 1,000 mg (not administered)  albuterol (PROVENTIL) (2.5 MG/3ML) 0.083% nebulizer solution 3 mL (not administered)  amiodarone (PACERONE) tablet 100 mg (not administered)  atorvastatin (LIPITOR) tablet 20 mg (not administered)  buPROPion (WELLBUTRIN SR) 12 hr tablet 150 mg (not administered)  carvedilol (COREG) tablet 12.5 mg (not administered)  hydrALAZINE (APRESOLINE) tablet 100 mg (not administered)  ferrous gluconate (FERGON) tablet 324 mg (not administered)  cholecalciferol (VITAMIN D) tablet 2,000 Units (not administered)  levothyroxine (SYNTHROID, LEVOTHROID) tablet 50 mcg (not administered)  prochlorperazine (COMPAZINE) tablet 10 mg (not administered)  rivaroxaban (XARELTO) tablet 20 mg (not administered)  febuxostat (ULORIC) tablet 40 mg (not administered)  lisinopril (PRINIVIL,ZESTRIL) tablet  40 mg (not administered)    Fluticasone-Umeclidin-Vilant 100-62.5-25 MCG/INH AEPB 1 puff (not administered)  ondansetron (ZOFRAN) tablet 4 mg (not administered)    Or  ondansetron (ZOFRAN) injection 4 mg (not administered)  sodium chloride 0.9 % bolus 1,000 mL (0 mLs Intravenous Stopped 01/07/17 2324)  potassium chloride SA (K-DUR,KLOR-CON) CR tablet 40 mEq (40 mEq Oral Given 01/07/17 2330)  magnesium sulfate IVPB 1 g 100 mL (0 g Intravenous Stopped 01/08/17 0031)     Initial Impression / Assessment and Plan / ED Course  I have reviewed the triage vital signs and the nursing notes.  Pertinent labs & imaging results that were available during my care of the patient were reviewed by me and considered in my medical decision making (see chart for details).     Jason Russell is a 66 y.o. male with a past medical history significant for CHF with defibrillator placement, COPD, atrial fibrillation, sleep apnea, hypertension, hyperlipidemia, and lung cancer who presents with decreased appetite, dehydration, weight loss, fatigue, mild shortness of breath, and defibrillator firing 2 days ago. Patient is committed by family reports that patient's last infusion chemotherapy treatment was just over 2 weeks ago. They report that since that time, patient has had no appetite and has not been eating or drinking. They report he has lost 20 pounds in the last 2 weeks. Patient has had no reported nausea or vomiting but has just not been eating. Patient has had mild chills and some shortness of breath but no significant cough. He denies any hearing changes, conservation, or diarrhea. He denies any chest pain. He reports that 2 days ago, while helping to lift his wife would following, his defibrillator fired in his chest. He denies loss of consciousness but reports immediate pain. He denied preceding symptoms although he was exerting himself.  Patient had no other complaints on arrival.  On my exam, lungs were clear. Chest is nontender.  Abdomen was nontender. Patient had minimal edema in his legs. No focal neurologic deficits.  Patient's deferred liver was interrogated. The interrogation team called nursing who reported that the patient had one shock 2 days ago that was in response to either V. Tach or V. Fib. The interpretation was faxed and is at the bedside.  Cardiology was called given the defibrillator firing as well as elevated BNP and troponin.   I suspect that the patient's fluid shifts with his dehydration has led to a [place] abnormalities and the patient's arrhythmia.  Cardiology recommended patient be admitted to workup for worsening heart failure or dehydration. Patient found to have low magnesium, supplementation ordered. Potassium was 3.5 however cardiology recommends potassium of 4. This was supplemented orally. Although patient is dehydrated, suspect patient may have some fluid overload with his elevated BNP and positive troponin.  Chest x-ray showed no acute findings an EKG appeared no different from prior.  The hospitalist team called and patient will be admitted for arrhythmia monitoring, electrolyte management, and careful rehydration in the setting of possible worsened heart failure.   Final Clinical Impressions(s) / ED Diagnoses   Final diagnoses:  Defibrillator discharge  Dehydration  Elevated troponin  Fatigue, unspecified type     Clinical Impression: 1. Defibrillator discharge   2. Dehydration   3. Elevated troponin   4. Fatigue, unspecified type     Disposition: Admit to Hospitalist service    Lynea Rollison, Gwenyth Allegra, MD 01/08/17 [phone]

## 2017-01-07 NOTE — ED Notes (Signed)
IV team at bedside accessing port.

## 2017-01-07 NOTE — ED Triage Notes (Addendum)
Pt here via [facility] for loss of appetite while taking chemo.  He was supposed to start new chemo tx last thursday, but had lost more  Than 20 lbs since September 20, so MD elected to stop chemo.  Pt states he has no appetite.  His defibrillator fired x 1 on Friday.  Presently denies pain, sob, nausea.  Pt's ex wife came to see him today and called 911 because she did not like the amount of weight he had lost, per [facility]. 124/74, hr 84, cbg 122, rr18, 98% RA.

## 2017-01-07 NOTE — ED Notes (Signed)
Called main lab to add on troponin I

## 2017-01-08 ENCOUNTER — Observation Stay (HOSPITAL_COMMUNITY): Payer: Medicare Other

## 2017-01-08 ENCOUNTER — Encounter (HOSPITAL_COMMUNITY): Payer: Self-pay

## 2017-01-08 DIAGNOSIS — R634 Abnormal weight loss: Principal | ICD-10-CM

## 2017-01-08 DIAGNOSIS — Z4502 Encounter for adjustment and management of automatic implantable cardiac defibrillator: Secondary | ICD-10-CM | POA: Diagnosis not present

## 2017-01-08 DIAGNOSIS — I5022 Chronic systolic (congestive) heart failure: Secondary | ICD-10-CM

## 2017-01-08 DIAGNOSIS — C3411 Malignant neoplasm of upper lobe, right bronchus or lung: Secondary | ICD-10-CM

## 2017-01-08 DIAGNOSIS — C3490 Malignant neoplasm of unspecified part of unspecified bronchus or lung: Secondary | ICD-10-CM | POA: Diagnosis not present

## 2017-01-08 DIAGNOSIS — I480 Paroxysmal atrial fibrillation: Secondary | ICD-10-CM | POA: Diagnosis not present

## 2017-01-08 DIAGNOSIS — E860 Dehydration: Secondary | ICD-10-CM | POA: Diagnosis not present

## 2017-01-08 DIAGNOSIS — R5382 Chronic fatigue, unspecified: Secondary | ICD-10-CM | POA: Diagnosis not present

## 2017-01-08 LAB — BASIC METABOLIC PANEL
Anion gap: 10 (ref 5–15)
BUN: 15 mg/dL (ref 6–20)
CHLORIDE: 103 mmol/L (ref 101–111)
CO2: 26 mmol/L (ref 22–32)
CREATININE: 1.54 mg/dL — AB (ref 0.61–1.24)
Calcium: 9.1 mg/dL (ref 8.9–10.3)
GFR calc Af Amer: 53 mL/min — ABNORMAL LOW (ref >60)
GFR, EST NON AFRICAN AMERICAN: 46 mL/min — AB (ref >60)
GLUCOSE: 100 mg/dL — AB (ref 65–99)
POTASSIUM: 3.6 mmol/L (ref 3.5–5.1)
SODIUM: 139 mmol/L (ref 135–145)

## 2017-01-08 LAB — URINALYSIS, ROUTINE W REFLEX MICROSCOPIC
BILIRUBIN URINE: NEGATIVE
GLUCOSE, UA: NEGATIVE mg/dL
HGB URINE DIPSTICK: NEGATIVE
KETONES UR: NEGATIVE mg/dL
Leukocytes, UA: NEGATIVE
Nitrite: NEGATIVE
PROTEIN: NEGATIVE mg/dL
Specific Gravity, Urine: 1.026 (ref 1.005–1.030)
pH: 5 (ref 5.0–8.0)

## 2017-01-08 LAB — HIV ANTIBODY (ROUTINE TESTING W REFLEX): HIV SCREEN 4TH GENERATION: NONREACTIVE

## 2017-01-08 MED ORDER — FLUTICASONE-UMECLIDIN-VILANT 100-62.5-25 MCG/INH IN AEPB: 1.0000 | INHALATION_SPRAY | Freq: Every day | RESPIRATORY_TRACT | Status: DC

## 2017-01-08 MED ORDER — ENSURE ENLIVE PO LIQD
237.0000 mL | Freq: Two times a day (BID) | ORAL | Status: DC
  Administered 2017-01-08 – 2017-01-09 (×4): 237 mL via ORAL

## 2017-01-08 MED ORDER — LEVOTHYROXINE SODIUM 50 MCG PO TABS
50.0000 ug | ORAL_TABLET | Freq: Every day | ORAL | Status: DC
  Administered 2017-01-08 – 2017-01-09 (×2): 50 ug via ORAL
  Filled 2017-01-08 (×2): qty 1

## 2017-01-08 MED ORDER — CARVEDILOL 12.5 MG PO TABS
12.5000 mg | ORAL_TABLET | Freq: Two times a day (BID) | ORAL | Status: DC
  Administered 2017-01-08 – 2017-01-09 (×3): 12.5 mg via ORAL
  Filled 2017-01-08 (×3): qty 1

## 2017-01-08 MED ORDER — FEBUXOSTAT 40 MG PO TABS
40.0000 mg | ORAL_TABLET | Freq: Every day | ORAL | Status: DC
  Administered 2017-01-08 – 2017-01-09 (×2): 40 mg via ORAL
  Filled 2017-01-08 (×2): qty 1

## 2017-01-08 MED ORDER — MAGNESIUM SULFATE 2 GM/50ML IV SOLN
2.0000 g | Freq: Once | INTRAVENOUS | Status: AC
  Administered 2017-01-08: 2 g via INTRAVENOUS
  Filled 2017-01-08: qty 50

## 2017-01-08 MED ORDER — ALBUTEROL SULFATE (2.5 MG/3ML) 0.083% IN NEBU: 3.0000 mL | INHALATION_SOLUTION | RESPIRATORY_TRACT | Status: DC | PRN

## 2017-01-08 MED ORDER — PROCHLORPERAZINE MALEATE 10 MG PO TABS
10.0000 mg | ORAL_TABLET | Freq: Four times a day (QID) | ORAL | Status: DC | PRN
Start: 2017-01-08 — End: 2017-01-09

## 2017-01-08 MED ORDER — BUPROPION HCL ER (SR) 150 MG PO TB12
150.0000 mg | ORAL_TABLET | Freq: Two times a day (BID) | ORAL | Status: DC
  Administered 2017-01-08 (×2): 150 mg via ORAL
  Filled 2017-01-08 (×3): qty 1

## 2017-01-08 MED ORDER — ACETAMINOPHEN 500 MG PO TABS: 1000.0000 mg | ORAL_TABLET | Freq: Four times a day (QID) | ORAL | Status: DC | PRN

## 2017-01-08 MED ORDER — RIVAROXABAN 20 MG PO TABS
20.0000 mg | ORAL_TABLET | Freq: Every day | ORAL | Status: DC
  Administered 2017-01-08: 20 mg via ORAL
  Filled 2017-01-08: qty 1

## 2017-01-08 MED ORDER — ONDANSETRON HCL 4 MG PO TABS: 4.0000 mg | ORAL_TABLET | Freq: Four times a day (QID) | ORAL | Status: DC | PRN

## 2017-01-08 MED ORDER — HYDRALAZINE HCL 25 MG PO TABS
100.0000 mg | ORAL_TABLET | Freq: Three times a day (TID) | ORAL | Status: DC
Start: 2017-01-08 — End: 2017-01-09
  Administered 2017-01-08 – 2017-01-09 (×4): 100 mg via ORAL
  Filled 2017-01-08 (×4): qty 4

## 2017-01-08 MED ORDER — LISINOPRIL 40 MG PO TABS
40.0000 mg | ORAL_TABLET | Freq: Every day | ORAL | Status: DC
  Administered 2017-01-08 – 2017-01-09 (×2): 40 mg via ORAL
  Filled 2017-01-08 (×2): qty 1

## 2017-01-08 MED ORDER — POTASSIUM CHLORIDE CRYS ER 20 MEQ PO TBCR
40.0000 meq | EXTENDED_RELEASE_TABLET | Freq: Once | ORAL | Status: AC
  Administered 2017-01-08: 40 meq via ORAL
  Filled 2017-01-08: qty 2

## 2017-01-08 MED ORDER — ONDANSETRON HCL 4 MG/2ML IJ SOLN: 4.0000 mg | Freq: Four times a day (QID) | INTRAMUSCULAR | Status: DC | PRN

## 2017-01-08 MED ORDER — UMECLIDINIUM BROMIDE 62.5 MCG/INH IN AEPB
1.0000 | INHALATION_SPRAY | Freq: Every day | RESPIRATORY_TRACT | Status: DC
  Administered 2017-01-08 – 2017-01-09 (×2): 1 via RESPIRATORY_TRACT
  Filled 2017-01-08: qty 7

## 2017-01-08 MED ORDER — FERROUS GLUCONATE 324 (38 FE) MG PO TABS
324.0000 mg | ORAL_TABLET | Freq: Three times a day (TID) | ORAL | Status: DC
  Administered 2017-01-08 – 2017-01-09 (×5): 324 mg via ORAL
  Filled 2017-01-08 (×6): qty 1

## 2017-01-08 MED ORDER — ATORVASTATIN CALCIUM 20 MG PO TABS
20.0000 mg | ORAL_TABLET | Freq: Every day | ORAL | Status: DC
  Administered 2017-01-08 – 2017-01-09 (×2): 20 mg via ORAL
  Filled 2017-01-08 (×2): qty 1

## 2017-01-08 MED ORDER — FLUTICASONE FUROATE-VILANTEROL 100-25 MCG/INH IN AEPB
1.0000 | INHALATION_SPRAY | Freq: Every day | RESPIRATORY_TRACT | Status: DC
  Administered 2017-01-09: 1 via RESPIRATORY_TRACT
  Filled 2017-01-08: qty 28

## 2017-01-08 MED ORDER — AMIODARONE HCL 100 MG PO TABS
100.0000 mg | ORAL_TABLET | Freq: Every day | ORAL | Status: DC
  Administered 2017-01-08 – 2017-01-09 (×2): 100 mg via ORAL
  Filled 2017-01-08 (×2): qty 1

## 2017-01-08 MED ORDER — VITAMIN D 1000 UNITS PO TABS
2000.0000 [IU] | ORAL_TABLET | Freq: Every day | ORAL | Status: DC
  Administered 2017-01-08 – 2017-01-09 (×2): 2000 [IU] via ORAL
  Filled 2017-01-08 (×2): qty 2

## 2017-01-08 NOTE — ED Notes (Signed)
Jason Russell [phone]

## 2017-01-08 NOTE — ED Notes (Signed)
Pts family came to desk and was a little frustrated and asked about patients bed status. Called bed control and mary said that the patient should get a bed

## 2017-01-08 NOTE — ED Notes (Signed)
Patient transported to CT 

## 2017-01-08 NOTE — Care Management Obs Status (Signed)
[facility] NOTIFICATION   Patient Details  Name: Jason Russell MRN: [id number] Date of Birth: 07-10-1950   Medicare Observation Status Notification Given:  Yes    Royston Bake, RN 01/08/2017, 3:47 PM

## 2017-01-08 NOTE — Progress Notes (Signed)
Initial Nutrition Assessment  DOCUMENTATION CODES:   Severe malnutrition in context of acute illness/injury  INTERVENTION:  Continue Ensure Enlive po BID, each supplement provides 350 kcal and 20 grams of protein  NUTRITION DIAGNOSIS:   Malnutrition (severe) related to acute illness, cancer and cancer related treatments, poor appetite as evidenced by mild depletion of muscle mass, energy intake < or equal to 50% for > or equal to 5 days, percent weight loss (9% in < 1 month).  GOAL:   Patient will meet greater than or equal to 90% of their needs  MONITOR:   PO intake, Supplement acceptance, Weight trends, I & O's  REASON FOR ASSESSMENT:   Consult Assessment of nutrition requirement/status  ASSESSMENT:   Pt with PMH of HTN, COPD, NICM, OSA on CPAP, gout, HLD, hypothyroidism, A.Fib and NSCLC of RUL completed chemotherapy and radiation and received one dose of immunotherapy presents with unintentional weight loss and weakness after his defibrillator fired.    Discussed pt with RN, reports pt consumed Ensure she provided.  Pt reports poor intake and appetite over the past 2 weeks, only consuming few fruits throughout the day. Pt reports no current taste changes, no current N/V and no constipation or diarrhea. Pt remains with poor appetite however states "I know I need to eat to get better." Per chart, pt consumed 100% of breakfast.   Pt reports losing 20 lbs in two weeks. Per chart, pt with 9% weight loss in <1 month, significant for time frame.  Nutrition focused physical exam completed. Findings include no fat depletion, mild muscle depletion and no edema.  Labs reviewed; Magnesium 1.6 Medications reviewed; vitamin D, ferrous gluconate, Uloric  Diet Order:  Diet Heart Room service appropriate? Yes; Fluid consistency: Thin  Skin:  Reviewed, no issues  Last BM:  01/08/17  Height:   Ht Readings from Last 1 Encounters:  01/07/17 6\' 1"  (1.854 m)    Weight:   Wt Readings  from Last 1 Encounters:  01/08/17 260 lb 1.6 oz (118 kg)    Ideal Body Weight:  83.6 kg  BMI:  Body mass index is 34.32 kg/m.  Estimated Nutritional Needs:   Kcal:  5974-1638  Protein:  135-145 grams  Fluid:  >/= 2.7 L/d  EDUCATION NEEDS:   Education needs no appropriate at this time  [place] Ranger, MS, RDN, LDN 01/08/2017 12:33 PM

## 2017-01-08 NOTE — Progress Notes (Signed)
Patient admitted after midnight, please see H&P.  Replace Mg and K.  Ambulate today and in AM.  Hope for d/c in [place] DO

## 2017-01-08 NOTE — H&P (Signed)
History and Physical    Jason Russell YSA:[id number] DOB: Jan 04, 1951 DOA: 01/07/2017  PCP: Lucianne Lei, MD  Patient coming from: Home  I have personally briefly reviewed patient's old medical records in [place]  Chief Complaint: Wt loss, Defibrillator fired  HPI: Jason Russell is a 66 y.o. male with medical history significant of CHF with AICD in place, A.Fib on amiodarone and Xarelto, NSCLC of RUL.  Finished chemotherapy and radiation, got first dose of immunotherapy x2 weeks ago.  After finishing chemo he had been feeling better, but ever since getting the one dose of immunotherapy he has felt generally weak, tired, has had 20 lb weight loss in that time frame due to not eating he says.  2 days ago patients wife had fallen down on the ground, patient was helping get her back on her feet, and his defibrillator went off suddenly.  No other symptoms before or after event other than defibrillator firing.   ED Course: Interrogation of AICD says that it read either V.Fib or V.Tach at the time.  EDP spoke with cards, they recommended getting K up to 4.0 and working the patient up for either worsening CHF or dehydration.  Patient given K PO and 1L NS in ED.   Review of Systems: As per HPI otherwise 10 point review of systems negative.   Past Medical History:  Diagnosis Date  . Adenocarcinoma of right lung, stage 3 ([place]) 08/23/2016  . Adenocarcinoma of right lung, stage 3 ([place]) 08/23/2016  . Anemia   . Arthritis    "hx right hip"  . Asthma    "when I was a child"  . Atrial fibrillation (HCC)    Amiodarone started 10/2011; Coumadin  . 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 St. Jude ICD implantation 10/03/12.  . Chronic anticoagulation   . Chronic fatigue 10/18/2016  . Chronic fatigue 10/18/2016  . Chronic systolic heart failure ([place])    a. Echo 7/13: EF 25%;  b. echo 04/2012:  Mild LVH, EF 30-35%, Gr 1 DD, mild AI, mild MR, mild LAE  . COPD  (chronic obstructive pulmonary disease) ([place])   . Dyslipidemia   . Dysrhythmia   . Encounter for antineoplastic chemotherapy 08/24/2016  . Gout   . History of blood transfusion 10/15/2013   "don't know where the blood's going; HgB down to 5"  . Hyperlipidemia   . Hypertension   . Hypothyroidism   . NICM (nonischemic cardiomyopathy) ([place])    [place] 4/14:  minimal CAD  . Obesity   . OSA on CPAP   . Tobacco abuse     Past Surgical History:  Procedure Laterality Date  . CARDIAC DEFIBRILLATOR PLACEMENT  2014  . CARDIOVERSION  2011  . COLONOSCOPY WITH PROPOFOL Left 10/17/2013   Procedure: COLONOSCOPY WITH PROPOFOL;  Surgeon: Inda Castle, MD;  Location: [facility];  Service: Endoscopy;  Laterality: Left;  . ESOPHAGOGASTRODUODENOSCOPY N/A 10/17/2013   Procedure: ESOPHAGOGASTRODUODENOSCOPY (EGD);  Surgeon: Inda Castle, MD;  Location: [facility];  Service: Endoscopy;  Laterality: N/A;  . GIVENS CAPSULE STUDY N/A 10/29/2013   Procedure: GIVENS CAPSULE STUDY;  Surgeon: Inda Castle, MD;  Location: [facility];  Service: Endoscopy;  Laterality: N/A;  . IMPLANTABLE CARDIOVERTER DEFIBRILLATOR IMPLANT Left 10/03/2012   Procedure: IMPLANTABLE CARDIOVERTER DEFIBRILLATOR IMPLANT;  Surgeon: Deboraha Sprang, MD;  Location: [facility] CATH LAB;  Service: Cardiovascular;  Laterality: Left;  . IR FLUORO GUIDE PORT INSERTION RIGHT  09/21/2016  . IR US GUIDE VASC  ACCESS RIGHT  09/21/2016  . JOINT REPLACEMENT    . TONSILLECTOMY  1950's  . TOTAL HIP ARTHROPLASTY Right 11/25/1997  . VIDEO BRONCHOSCOPY WITH ENDOBRONCHIAL ULTRASOUND N/A 08/09/2016   Procedure: VIDEO BRONCHOSCOPY WITH ENDOBRONCHIAL ULTRASOUND;  Surgeon: Javier Glazier, MD;  Location: [place] [facility];  Service: Thoracic;  Laterality: N/A;     reports that he quit smoking about 5 months ago. His smoking use included Cigarettes. He has a 11.25 pack-year smoking history. He has never used smokeless tobacco. He reports that he drinks about 0.6 oz of alcohol per  week . He reports that he does not use drugs.  No Known Allergies  Family History  Problem Relation Age of Onset  . Hypertension Mother   . Other Father        deceased- unknow reason  . Lung cancer Brother      Prior to Admission medications   Medication Sig Start Date End Date Taking? Authorizing Provider  acetaminophen (TYLENOL) 500 MG tablet Take 1,000 mg by mouth every 6 (six) hours as needed for mild pain.   Yes [provider]  albuterol (PROVENTIL HFA;VENTOLIN HFA) 108 (90 Base) MCG/ACT inhaler Inhale 2 puffs into the lungs every 4 (four) hours as needed for wheezing or shortness of breath. 08/18/16  Yes de Hatteras, [place]imea A, MD  amiodarone (PACERONE) 100 MG tablet TAKE 1 TABLET BY MOUTH DAILY 11/01/16  Yes Fay Records, MD  atorvastatin (LIPITOR) 20 MG tablet TAKE 1 TABLET BY MOUTH EVERY DAY 05/25/16  Yes Fay Records, MD  buPROPion (WELLBUTRIN SR) 150 MG 12 hr tablet TAKE 1 TABLET (150 MG TOTAL) BY MOUTH 2 (TWO) TIMES DAILY. Patient taking differently: Take 150 mg by mouth 2 (two) times daily.  10/08/15  Yes Fay Records, MD  carvedilol (COREG) 12.5 MG tablet TAKE 1 TABLET (12.5 MG TOTAL) BY MOUTH 2 (TWO) TIMES DAILY. 07/20/16  Yes Deboraha Sprang, MD  [employer]S D3 2000 units CAPS Take 2,000 Units by mouth daily.  11/02/16  Yes [provider]  ferrous gluconate (FERGON) 324 MG tablet Take 1 tablet (324 mg total) by mouth 3 (three) times daily with meals. 05/12/16  Yes Deboraha Sprang, MD  Fluticasone-Umeclidin-Vilant (TRELEGY ELLIPTA) 100-62.5-25 MCG/INH AEPB Inhale 1 puff into the lungs daily. 12/08/16  Yes Javier Glazier, MD  furosemide (LASIX) 20 MG tablet Take 3 tabs (60 mg) every Tue and Sat, all other days take 2 tabs (40 mg) Patient taking differently: Take 40-60 mg by mouth See admin instructions. Take 3 tabs (60 mg) every Tue and Sat, all other days take 2 tabs (40 mg) 07/25/16  Yes Fay Records, MD  hydrALAZINE (APRESOLINE) 100 MG tablet Take 100 mg by mouth 3  (three) times daily.  11/19/14  Yes [provider]  levothyroxine (SYNTHROID, LEVOTHROID) 50 MCG tablet TAKE 1 TABLET (50 MCG TOTAL) BY MOUTH DAILY BEFORE BREAKFAST. 10/23/16  Yes Fay Records, MD  lidocaine-prilocaine (EMLA) cream Apply generous amount to port site at least 1 hr prior to treatment.  DO NOT RUB IN. 12/21/16  Yes Curt Bears, MD  lisinopril (PRINIVIL,ZESTRIL) 40 MG tablet TAKE 1 TABLET BY MOUTH DAILY. 09/21/16  Yes Fay Records, MD  potassium chloride SA (KLOR-CON M20) 20 MEQ tablet Take 1 tablet every day.  On days when lasix is taken, take 2 tablets Patient taking differently: Take 20 mEq by mouth daily.  04/13/16  Yes Fay Records, MD  prochlorperazine (COMPAZINE) 10 MG  tablet Take 1 tablet (10 mg total) by mouth every 6 (six) hours as needed for nausea or vomiting. 08/24/16  Yes Curt Bears, MD  rivaroxaban (XARELTO) 20 MG TABS tablet Take 1 tablet (20 mg total) by mouth daily. 08/11/16  Yes Javier Glazier, MD  sildenafil (REVATIO) 20 MG tablet Take 1 tablet (20 mg total) by mouth at bedtime. Take 2-3 tablets daily prn for ED Patient taking differently: Take 20-60 mg by mouth daily as needed (for ED).  08/05/15  Yes Fay Records, MD  spironolactone (ALDACTONE) 25 MG tablet TAKE 0.5 TABLETS (12.5 MG TOTAL) BY MOUTH DAILY. 09/21/16  Yes Fay Records, MD  ULORIC 40 MG tablet Take 1 tablet (40 mg total) by mouth daily. 10/17/16  Yes Deboraha Sprang, MD    Physical Exam: Vitals:   01/07/17 2345 01/08/17 0000 01/08/17 0015 01/08/17 0030  BP: 122/86 134/89 137/81 138/89  Pulse: 87 83 80 83  Resp: (!) 27 (!) 27 (!) 27 (!) 24  Temp:      TempSrc:      SpO2: 96% 93% 94% 96%  Weight:      Height:        Constitutional: NAD, calm, comfortable Eyes: PERRL, lids and conjunctivae normal ENMT: Mucous membranes are moist. Posterior pharynx clear of any exudate or lesions.Normal dentition.  Neck: normal, supple, no masses, no thyromegaly Respiratory: clear to  auscultation bilaterally, no wheezing, no crackles. Normal respiratory effort. No accessory muscle use.  Cardiovascular: Regular rate and rhythm, no murmurs / rubs / gallops. No extremity edema. 2+ pedal pulses. No carotid bruits.  Abdomen: no tenderness, no masses palpated. No hepatosplenomegaly. Bowel sounds positive.  Musculoskeletal: no clubbing / cyanosis. No joint deformity upper and lower extremities. Good ROM, no contractures. Normal muscle tone.  Skin: no rashes, lesions, ulcers. No induration Neurologic: CN 2-12 grossly intact. Sensation intact, DTR normal. Strength 5/5 in all 4.  Psychiatric: Normal judgment and insight. Alert and oriented x 3. Normal mood.    Labs on Admission: I have personally reviewed following labs and imaging studies  CBC:  Recent Labs Lab 01/04/17 1108 01/07/17 2029  WBC 6.9 5.9  NEUTROABS 5.5 4.5  HGB 10.8* 10.1*  HCT 33.7* 31.8*  MCV 92.3 90.6  PLT 200 767   Basic Metabolic Panel:  Recent Labs Lab 01/04/17 1108 01/07/17 2029  NA 140 136  K 3.6 3.5  CL  --  101  CO2 25 26  GLUCOSE 118 113*  BUN 23.5 16  CREATININE 1.9* 1.62*  CALCIUM 10.1 9.0  MG  --  1.6*   GFR: Estimated Creatinine Clearance: 61.7 mL/min (A) (by C-G formula based on SCr of 1.62 mg/dL (H)). Liver Function Tests:  Recent Labs Lab 01/04/17 1108 01/07/17 2029  AST 85* 46*  ALT 100* 65*  ALKPHOS 230* 166*  BILITOT 0.46 0.7  PROT 7.7 6.9  ALBUMIN 2.9* 2.8*    Recent Labs Lab 01/07/17 2029  LIPASE 25   No results for input(s): AMMONIA in the last 168 hours. Coagulation Profile: No results for input(s): INR, PROTIME in the last 168 hours. Cardiac Enzymes:  Recent Labs Lab 01/07/17 2029  TROPONINI 0.05*   BNP (last 3 results)  Recent Labs  04/28/16 1346 05/11/16 1445 06/26/16 1129  PROBNP 2,565* 3,049* 1,996*   HbA1C: No results for input(s): HGBA1C in the last 72 hours. CBG: No results for input(s): GLUCAP in the last 168 hours. Lipid  Profile: No results for input(s): CHOL, HDL,  LDLCALC, TRIG, CHOLHDL, LDLDIRECT in the last 72 hours. Thyroid Function Tests: No results for input(s): TSH, T4TOTAL, FREET4, T3FREE, THYROIDAB in the last 72 hours. Anemia Panel: No results for input(s): VITAMINB12, FOLATE, FERRITIN, TIBC, IRON, RETICCTPCT in the last 72 hours. Urine analysis: No results found for: COLORURINE, APPEARANCEUR, LABSPEC, PHURINE, GLUCOSEU, HGBUR, BILIRUBINUR, KETONESUR, PROTEINUR, UROBILINOGEN, NITRITE, LEUKOCYTESUR  Radiological Exams on Admission: Dg Chest 2 View  Result Date: 01/07/2017 CLINICAL DATA:  Dyspnea and fatigue for several days. Defibrillator fired. EXAM: CHEST  2 VIEW COMPARISON:  11/20/2016, 09/06/2016 FINDINGS: Right jugular port appears satisfactorily positioned. Transvenous cardiac leads appear grossly intact. No airspace consolidation. No effusions. Normal pulmonary vasculature. Hilar and mediastinal contours are unremarkable and unchanged. IMPRESSION: No acute cardiopulmonary findings. Electronically Signed   By: Andreas Newport M.D.   On: 01/07/2017 21:43    EKG: Independently reviewed.  Assessment/Plan Principal Problem:   Weight loss, non-intentional Active Problems:   Atrial fibrillation (HCC)   Chronic systolic heart failure (HCC)   Primary cancer of right upper lobe of lung (HCC)   Chronic fatigue   Defibrillator discharge    1. Defibrillator discharge - 1. Repeat BMP in AM 2. Replacing Mg++ 3. Tele monitor 2. NSCLC RUL - 1. With 20 lbs weight loss since he took 1 dose of immunotherapy x2 weeks ago 2. Immunotherapy side effect vs growing tumor 3. Will get CT chest to evaluate tumor 4. 1L bolus of NS and will hold diuretics for today. 3. Chronic systolic CHF - 1. Holding diuretics for today 2. Not clinically overloaded at this time 4. PAF - 1. Continue amiodarone 2. Continue Xarelto  DVT prophylaxis: Xarelto Code Status: Full Family Communication: Wife at  bedside Disposition Plan: Home after admit Consults called: None Admission status: Place in [place], [facility] Pager [phone]  If 7AM-7PM, please contact day team taking care of patient [email or web address] Password [email or web address]  01/08/2017, 1:41 AM

## 2017-01-09 DIAGNOSIS — Z4502 Encounter for adjustment and management of automatic implantable cardiac defibrillator: Secondary | ICD-10-CM

## 2017-01-09 DIAGNOSIS — E860 Dehydration: Secondary | ICD-10-CM | POA: Diagnosis not present

## 2017-01-09 DIAGNOSIS — R5382 Chronic fatigue, unspecified: Secondary | ICD-10-CM

## 2017-01-09 DIAGNOSIS — R634 Abnormal weight loss: Secondary | ICD-10-CM | POA: Diagnosis not present

## 2017-01-09 LAB — CBC
HEMATOCRIT: 30.6 % — AB (ref 39.0–52.0)
HEMOGLOBIN: 9.5 g/dL — AB (ref 13.0–17.0)
MCH: 28.2 pg (ref 26.0–34.0)
MCHC: 31 g/dL (ref 30.0–36.0)
MCV: 90.8 fL (ref 78.0–100.0)
Platelets: UNDETERMINED 10*3/uL (ref 150–400)
RBC: 3.37 MIL/uL — ABNORMAL LOW (ref 4.22–5.81)
RDW: 16 % — AB (ref 11.5–15.5)
WBC: 5.2 10*3/uL (ref 4.0–10.5)

## 2017-01-09 LAB — MAGNESIUM: MAGNESIUM: 1.8 mg/dL (ref 1.7–2.4)

## 2017-01-09 LAB — BASIC METABOLIC PANEL
Anion gap: 10 (ref 5–15)
BUN: 19 mg/dL (ref 6–20)
CHLORIDE: 105 mmol/L (ref 101–111)
CO2: 25 mmol/L (ref 22–32)
CREATININE: 1.58 mg/dL — AB (ref 0.61–1.24)
Calcium: 9.1 mg/dL (ref 8.9–10.3)
GFR calc Af Amer: 51 mL/min — ABNORMAL LOW (ref >60)
GFR calc non Af Amer: 44 mL/min — ABNORMAL LOW (ref >60)
GLUCOSE: 103 mg/dL — AB (ref 65–99)
Potassium: 3.6 mmol/L (ref 3.5–5.1)
Sodium: 140 mmol/L (ref 135–145)

## 2017-01-09 LAB — URINE CULTURE

## 2017-01-09 MED ORDER — CARVEDILOL 12.5 MG PO TABS: 12.5000 mg | ORAL_TABLET | Freq: Once | ORAL | Status: AC

## 2017-01-09 MED ORDER — HEPARIN SOD (PORK) LOCK FLUSH 100 UNIT/ML IV SOLN
500.0000 [IU] | INTRAVENOUS | Status: AC | PRN
  Administered 2017-01-09: 500 [IU]

## 2017-01-09 MED ORDER — MAGNESIUM SULFATE 2 GM/50ML IV SOLN
2.0000 g | Freq: Once | INTRAVENOUS | Status: AC
  Administered 2017-01-09: 2 g via INTRAVENOUS
  Filled 2017-01-09 (×2): qty 50

## 2017-01-09 MED ORDER — POTASSIUM CHLORIDE CRYS ER 20 MEQ PO TBCR: 40.0000 meq | EXTENDED_RELEASE_TABLET | Freq: Every day | ORAL | 0 refills | Status: DC

## 2017-01-09 MED ORDER — POTASSIUM CHLORIDE CRYS ER 20 MEQ PO TBCR
40.0000 meq | EXTENDED_RELEASE_TABLET | Freq: Once | ORAL | Status: AC
  Administered 2017-01-09: 40 meq via ORAL
  Filled 2017-01-09: qty 2

## 2017-01-09 MED ORDER — CARVEDILOL 25 MG PO TABS: 25.0000 mg | ORAL_TABLET | Freq: Two times a day (BID) | ORAL | Status: DC

## 2017-01-09 MED ORDER — MAGNESIUM OXIDE 400 (241.3 MG) MG PO TABS: 400.0000 mg | ORAL_TABLET | Freq: Every day | ORAL | 0 refills | Status: AC

## 2017-01-09 MED ORDER — MAGNESIUM OXIDE 400 (241.3 MG) MG PO TABS
400.0000 mg | ORAL_TABLET | Freq: Every day | ORAL | Status: DC
  Administered 2017-01-09: 400 mg via ORAL
  Filled 2017-01-09: qty 1

## 2017-01-09 MED ORDER — CARVEDILOL 25 MG PO TABS: 25.0000 mg | ORAL_TABLET | Freq: Two times a day (BID) | ORAL | 0 refills | Status: DC

## 2017-01-09 MED ORDER — ENSURE ENLIVE PO LIQD: 237.0000 mL | Freq: Two times a day (BID) | ORAL | 12 refills | Status: DC

## 2017-01-09 NOTE — Discharge Summary (Addendum)
Physician Discharge Summary  Jason Russell ERD:[id number] DOB: [birth year] DOA: 01/07/2017  PCP: Lucianne Lei, MD  Admit date: 01/07/2017 Discharge date: 01/09/2017   Recommendations for Outpatient Follow-Up:   1. Close monitoring of K and Mg to ensure levels of 4 and 2 respectively   Discharge Diagnosis:   Principal Problem:   Weight loss, non-intentional Active Problems:   Atrial fibrillation (HCC)   Chronic systolic heart failure (HCC)   Primary cancer of right upper lobe of lung (HCC)   Chronic fatigue   Defibrillator discharge   Discharge disposition:  Home  Discharge Condition: Improved.  Diet recommendation: regular  Wound care: None.   History of Present Illness:   Jason Russell is a 66 y.o. male with medical history significant of CHF with AICD in place, A.Fib on amiodarone and Xarelto, NSCLC of RUL.  Finished chemotherapy and radiation, got first dose of immunotherapy x2 weeks ago.  After finishing chemo he had been feeling better, but ever since getting the one dose of immunotherapy he has felt generally weak, tired, has had 20 lb weight loss in that time frame due to not eating he says.  2 days ago patients wife had fallen down on the ground, patient was helping get her back on her feet, and his defibrillator went off suddenly.  No other symptoms before or after event other than defibrillator firing.   Hospital Course by Problem:   Hypokalemia and hypomagnesemia -replete -will need close follow up  Firing of AICD -follow up with Dr. Caryl Comes -suspect due to low electrolytes -electrolytes off due to decreased appetite after immunotherapy -increased coreg  Weight loss -cardiology notified by ER-- replace electrolytes -has lung cancer-- CT Scan done in hospital -- defer to oncology follow up -defer to oncology -added supplements  Dehydration -due to decreased appetite -gentle IVF -resumed home diuretics     Medical Consultants:     None.   Discharge Exam:   Vitals:   01/09/17 0822 01/09/17 1225  BP:  138/76  Pulse:  83  Resp:  18  Temp:  98.7 F (37.1 C)  SpO2: 99% 98%   Vitals:   01/09/17 0420 01/09/17 0719 01/09/17 0822 01/09/17 1225  BP: 119/65 (!) 146/80  138/76  Pulse: 92 84  83  Resp: 20   18  Temp: 98.2 F (36.8 C)   98.7 F (37.1 C)  TempSrc: Oral   Oral  SpO2: 97% 98% 99% 98%  Weight: 119.9 kg (264 lb 4.8 oz)     Height:        Gen:  NAD   The results of significant diagnostics from this hospitalization (including imaging, microbiology, ancillary and laboratory) are listed below for reference.     Procedures and Diagnostic Studies:   Dg Chest 2 View  Result Date: 01/07/2017 CLINICAL DATA:  Dyspnea and fatigue for several days. Defibrillator fired. EXAM: CHEST  2 VIEW COMPARISON:  11/20/2016, 09/06/2016 FINDINGS: Right jugular port appears satisfactorily positioned. Transvenous cardiac leads appear grossly intact. No airspace consolidation. No effusions. Normal pulmonary vasculature. Hilar and mediastinal contours are unremarkable and unchanged. IMPRESSION: No acute cardiopulmonary findings. Electronically Signed   By: Andreas Newport M.D.   On: 01/07/2017 21:43   Ct Chest Wo Contrast  Result Date: 01/08/2017 CLINICAL DATA:  Non-small cell lung cancer.  Defibrillator firing. EXAM: CT CHEST WITHOUT CONTRAST TECHNIQUE: Multidetector CT imaging of the chest was performed following the standard protocol without IV contrast. COMPARISON:  Chest CT 11/20/2016 FINDINGS: Cardiovascular: The heart is  enlarged. No pericardial effusion. Mild aortic arch atherosclerotic calcification. Single lead left chest wall cardioverter with lead tip in the right ventricle. Right chest wall Port-A-Cath terminates in the lower SVC. Mediastinum/Nodes: No mediastinal, hilar or axillary lymphadenopathy. The visualized thyroid and thoracic esophageal course are unremarkable. Lungs/Pleura: Nodular opacity in the  right lung apex has increased in size, measuring 17 x 11 mm, previously 8 mm. It is less dense, however. There are scattered other right upper lobe opacities. Anterior paramediastinal opacities within the right upper lobe are new from the prior CT the may indicate atelectasis or scarring/ fibrosis. Upper Abdomen: Nonobstructive right renal calculus measuring up to 6 mm. Musculoskeletal: No chest wall mass or suspicious bone lesions identified. IMPRESSION: 1. No acute thoracic abnormality. 2. Decreased density of nodular opacity of the right lung apex, but slightly increased size of amid the appearance of other adjacent intermediate density opacities. New anterior paramediastinal opacities in the right upper lobe. Findings are favored to represent post treatment effect, particularly in the context of relatively recent radiation therapy. Infectious etiologies are a secondary consideration. While progression of disease seems less likely, it would be difficult to exclude entirely. 3.  Aortic Atherosclerosis (ICD10-I70.0). Electronically Signed   By: Ulyses Jarred M.D.   On: 01/08/2017 02:30     Labs:   Basic Metabolic Panel:  Recent Labs Lab 01/04/17 1108  01/07/17 2029 01/08/17 0414 01/09/17 0403  NA 140  --  136 139 140  K 3.6  < > 3.5 3.6 3.6  CL  --   --  101 103 105  CO2 25  --  26 26 25   GLUCOSE 118  --  113* 100* 103*  BUN 23.5  --  16 15 19   CREATININE 1.9*  --  1.62* 1.54* 1.58*  CALCIUM 10.1  --  9.0 9.1 9.1  MG  --   --  1.6*  --  1.8  < > = values in this interval not displayed. GFR Estimated Creatinine Clearance: 63.2 mL/min (A) (by C-G formula based on SCr of 1.58 mg/dL (H)). Liver Function Tests:  Recent Labs Lab 01/04/17 1108 01/07/17 2029  AST 85* 46*  ALT 100* 65*  ALKPHOS 230* 166*  BILITOT 0.46 0.7  PROT 7.7 6.9  ALBUMIN 2.9* 2.8*    Recent Labs Lab 01/07/17 2029  LIPASE 25   No results for input(s): AMMONIA in the last 168 hours. Coagulation profile No  results for input(s): INR, PROTIME in the last 168 hours.  CBC:  Recent Labs Lab 01/04/17 1108 01/07/17 2029 01/09/17 0403  WBC 6.9 5.9 5.2  NEUTROABS 5.5 4.5  --   HGB 10.8* 10.1* 9.5*  HCT 33.7* 31.8* 30.6*  MCV 92.3 90.6 90.8  PLT 200 184 PLATELET CLUMPS NOTED ON SMEAR, UNABLE TO ESTIMATE   Cardiac Enzymes:  Recent Labs Lab 01/07/17 2029  TROPONINI 0.05*   BNP: Invalid input(s): POCBNP CBG: No results for input(s): GLUCAP in the last 168 hours. D-Dimer No results for input(s): DDIMER in the last 72 hours. Hgb A1c No results for input(s): HGBA1C in the last 72 hours. Lipid Profile No results for input(s): CHOL, HDL, LDLCALC, TRIG, CHOLHDL, LDLDIRECT in the last 72 hours. Thyroid function studies No results for input(s): TSH, T4TOTAL, T3FREE, THYROIDAB in the last 72 hours.  Invalid input(s): FREET3 Anemia work up No results for input(s): VITAMINB12, FOLATE, FERRITIN, TIBC, IRON, RETICCTPCT in the last 72 hours. Microbiology Recent Results (from the past 240 hour(s))  Urine culture  Status: Abnormal   Collection Time: 01/08/17  4:01 AM  Result Value Ref Range Status   Specimen Description URINE, RANDOM  Final   Special Requests NONE  Final   Culture MULTIPLE SPECIES PRESENT, SUGGEST RECOLLECTION (A)  Final   Report Status 01/09/2017 FINAL  Final     Discharge Instructions:   Discharge Instructions    Diet - low sodium heart healthy    Complete by:  As directed    Discharge instructions    Complete by:  As directed    Close monitoring of Magnesium and Potassium   Increase activity slowly    Complete by:  As directed      Allergies as of 01/09/2017   No Known Allergies     Medication List    STOP taking these medications   sildenafil 20 MG tablet Commonly known as:  REVATIO     TAKE these medications   acetaminophen 500 MG tablet Commonly known as:  TYLENOL Take 1,000 mg by mouth every 6 (six) hours as needed for mild pain.   albuterol  108 (90 Base) MCG/ACT inhaler Commonly known as:  PROVENTIL HFA;VENTOLIN HFA Inhale 2 puffs into the lungs every 4 (four) hours as needed for wheezing or shortness of breath.   amiodarone 100 MG tablet Commonly known as:  PACERONE TAKE 1 TABLET BY MOUTH DAILY   atorvastatin 20 MG tablet Commonly known as:  LIPITOR TAKE 1 TABLET BY MOUTH EVERY DAY   buPROPion 150 MG 12 hr tablet Commonly known as:  WELLBUTRIN SR TAKE 1 TABLET (150 MG TOTAL) BY MOUTH 2 (TWO) TIMES DAILY. What changed:  how much to take  how to take this  when to take this  additional instructions   carvedilol 25 MG tablet Commonly known as:  COREG Take 1 tablet (25 mg total) by mouth 2 (two) times daily with a meal. What changed:  medication strength  how much to take  when to take this   CVS D3 2000 units Caps Generic drug:  Cholecalciferol Take 2,000 Units by mouth daily.   feeding supplement (ENSURE ENLIVE) Liqd Take 237 mLs by mouth 2 (two) times daily between meals.   ferrous gluconate 324 MG tablet Commonly known as:  FERGON Take 1 tablet (324 mg total) by mouth 3 (three) times daily with meals.   Fluticasone-Umeclidin-Vilant 100-62.5-25 MCG/INH Aepb Commonly known as:  TRELEGY ELLIPTA Inhale 1 puff into the lungs daily.   furosemide 20 MG tablet Commonly known as:  LASIX Take 3 tabs (60 mg) every Tue and Sat, all other days take 2 tabs (40 mg) What changed:  how much to take  how to take this  when to take this  additional instructions   hydrALAZINE 100 MG tablet Commonly known as:  APRESOLINE Take 100 mg by mouth 3 (three) times daily.   levothyroxine 50 MCG tablet Commonly known as:  SYNTHROID, LEVOTHROID TAKE 1 TABLET (50 MCG TOTAL) BY MOUTH DAILY BEFORE BREAKFAST.   lidocaine-prilocaine cream Commonly known as:  EMLA Apply generous amount to port site at least 1 hr prior to treatment.  DO NOT RUB IN.   lisinopril 40 MG tablet Commonly known as:   PRINIVIL,ZESTRIL TAKE 1 TABLET BY MOUTH DAILY.   magnesium oxide 400 (241.3 Mg) MG tablet Commonly known as:  MAG-OX Take 1 tablet (400 mg total) by mouth daily.   potassium chloride SA 20 MEQ tablet Commonly known as:  KLOR-CON M20 Take 2 tablets (40 mEq total) by mouth daily.  What changed:  how much to take  how to take this  when to take this  additional instructions   prochlorperazine 10 MG tablet Commonly known as:  COMPAZINE Take 1 tablet (10 mg total) by mouth every 6 (six) hours as needed for nausea or vomiting.   rivaroxaban 20 MG Tabs tablet Commonly known as:  XARELTO Take 1 tablet (20 mg total) by mouth daily.   spironolactone 25 MG tablet Commonly known as:  ALDACTONE TAKE 0.5 TABLETS (12.5 MG TOTAL) BY MOUTH DAILY.   ULORIC 40 MG tablet Generic drug:  febuxostat Take 1 tablet (40 mg total) by mouth daily.      Follow-up Information    Lucianne Lei, MD Follow up in 1 week(s).   Specialty:  Family Medicine Why:  close mointoring of Magnesium and K Contact information: [address] [place] [address] [phone]        Deboraha Sprang, MD Follow up in 1 week(s).   Specialty:  Cardiology Contact information: [address] [place] [address] [phone]            Time coordinating discharge: 35 min  Signed:  Dantonio Justen Alison Stalling   [facility] 01/09/2017, 2:34 PM

## 2017-01-09 NOTE — Progress Notes (Signed)
Ambulated with patient in hall. Patient tolerated well. Seemed to be tachy while walking.

## 2017-01-09 NOTE — Progress Notes (Signed)
ICD interrogation:   ST Jude Fortify Assura VR ICD implanted 10/03/12 by Dr Caryl Comes. Interrogation today is reviewed and reveals normal device function.  Battery status is good (5.2 years remain) R waves 10 mV impedance 440 Ohms  Arrhythmia: VT episode 01/05/17 at 4:06 pm Monomorphic VT at 270 msec CL ATP unsuccessful x 3 with arrhythmia acceleration to ventricular flutter and then 700 V successful ICD shock.   The patient has been discharged.  Plan per Dr Eliseo Squires. No driving x 6 months is advised I am unable to provide additional consultation as the patient has already been discharged.  Will arrange outpatient follow-up with Dr Thomas Hoff MD, [facility] 01/09/2017 4:26 PM

## 2017-01-09 NOTE — Progress Notes (Signed)
Reviewed discharge instructions/medications with patient and family member. Answered their questions. Patient is stable and ready for discharge. Waiting on IV team to disconnect his port.

## 2017-01-11 DIAGNOSIS — IMO0001 Reserved for inherently not codable concepts without codable children: Secondary | ICD-10-CM

## 2017-01-11 DIAGNOSIS — I509 Heart failure, unspecified: Secondary | ICD-10-CM | POA: Insufficient documentation

## 2017-01-11 DIAGNOSIS — R590 Localized enlarged lymph nodes: Secondary | ICD-10-CM

## 2017-01-11 DIAGNOSIS — E039 Hypothyroidism, unspecified: Secondary | ICD-10-CM | POA: Insufficient documentation

## 2017-01-11 DIAGNOSIS — F32A Depression, unspecified: Secondary | ICD-10-CM | POA: Insufficient documentation

## 2017-01-12 ENCOUNTER — Encounter: Payer: Self-pay | Admitting: *Deleted

## 2017-01-14 NOTE — Progress Notes (Signed)
Cardiology Office Note Date:  01/15/2017  Patient ID:  Jason Russell, DOB Jun 04, 1950, MRN [id number] PCP:  Lucianne Lei, MD  Cardiologist:  Dr. Harrington Challenger Electrophysiologist: Dr. Caryl Comes    Chief Complaint: ICD therapy  History of Present Illness: Jason Russell is a 66 y.o. male with history of NICM w/ICD, chronic CHF (systolic), PAFIb, NSC lung Ca, COPD, hypothyroidism, HTN, HLD.  He comes in today to be seen for Dr. Caryl Comes, last seen by him in July at that time doing fairly well, no changes were made to his tx.  He was hospitalized 10/8-10/9/18 with rapid unintentional weight loss and ICD therapy.  He reported that his wife had fallen and he was helping her up when he was shocked by his device.  No reports of symptoms associated with it.  The patient was evaluated and found to be hypokalemic and low mag as well and felt this contributed to his arhythmia, by interrogation was VT.  He also reported that he had completed his chemo and radiatinon was doing OK when they started immunotherapy after his first dose, became weak, anorexic and reported 20lb weight loss in 2 weeks.  The patient's electrolytes were replaced his coreg was increased and discharged home.  All-in-all, he feels generally weak, no stamina, getting winded easily with minimal exertion.  Says before he started this new cancer therapy he could do just about anything he wanted.  He denies any CP, no palpitations, no dizziness, near syncope or syncope.  Since the hospital stay he has been forcing himself to eat some and thinks he has put back on a couple pounds.  No symptoms of PND or orthopnea.  He denies any particular symptoms when he was shocked.  He was helping his wife up off the floor when he was shocked.  No symptoms afterwards either.  He denies any bleeding or signs of bleeding  Device information: SJM single chamber ICD, implanted 10/03/12, Dr. Caryl Comes AAD Tx: Amiodarone + hx of appropriate tx Oct 2018 for VT  Past  Medical History:  Diagnosis Date  . Adenocarcinoma of right lung, stage 3 ([place]) 08/23/2016  . Adenocarcinoma of right lung, stage 3 ([place]) 08/23/2016  . Anemia   . Arthritis    "hx right hip"  . Asthma    "when I was a child"  . Atrial fibrillation (H[facility])    Amiodarone started 10/2011; Coumadin  . 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 St. Jude ICD implantation 10/03/12.  . Chronic anticoagulation   . Chronic fatigue 10/18/2016  . Chronic fatigue 10/18/2016  . Chronic systolic heart failure ([place])    a. Echo 7/13: EF 25%;  b. echo 04/2012:  Mild LVH, EF 30-35%, Gr 1 DD, mild AI, mild MR, mild LAE  . COPD (chronic obstructive pulmonary disease) ([place])   . Dyslipidemia   . Dysrhythmia   . Encounter for antineoplastic chemotherapy 08/24/2016  . Gout   . History of blood transfusion 10/15/2013   "don't know where the blood's going; HgB down to 5"  . Hyperlipidemia   . Hypertension   . Hypothyroidism   . NICM (nonischemic cardiomyopathy) ([place])    [place] 4/14:  minimal CAD  . Obesity   . OSA on CPAP   . Tobacco abuse     Past Surgical History:  Procedure Laterality Date  . CARDIAC DEFIBRILLATOR PLACEMENT  2014  . CARDIOVERSION  2011  . COLONOSCOPY WITH PROPOFOL Left 10/17/2013   Procedure: COLONOSCOPY WITH PROPOFOL;  Surgeon: Sandy Salaam  Deatra Ina, MD;  Location: [facility];  Service: Endoscopy;  Laterality: Left;  . ESOPHAGOGASTRODUODENOSCOPY N/A 10/17/2013   Procedure: ESOPHAGOGASTRODUODENOSCOPY (EGD);  Surgeon: Inda Castle, MD;  Location: [facility];  Service: Endoscopy;  Laterality: N/A;  . GIVENS CAPSULE STUDY N/A 10/29/2013   Procedure: GIVENS CAPSULE STUDY;  Surgeon: Inda Castle, MD;  Location: [facility];  Service: Endoscopy;  Laterality: N/A;  . IMPLANTABLE CARDIOVERTER DEFIBRILLATOR IMPLANT Left 10/03/2012   Procedure: IMPLANTABLE CARDIOVERTER DEFIBRILLATOR IMPLANT;  Surgeon: Deboraha Sprang, MD;  Location: [facility] CATH LAB;  Service: Cardiovascular;   Laterality: Left;  . IR FLUORO GUIDE PORT INSERTION RIGHT  09/21/2016  . IR US GUIDE VASC ACCESS RIGHT  09/21/2016  . JOINT REPLACEMENT    . TONSILLECTOMY  1950's  . TOTAL HIP ARTHROPLASTY Right 11/25/1997  . VIDEO BRONCHOSCOPY WITH ENDOBRONCHIAL ULTRASOUND N/A 08/09/2016   Procedure: VIDEO BRONCHOSCOPY WITH ENDOBRONCHIAL ULTRASOUND;  Surgeon: Javier Glazier, MD;  Location: [facility] OR;  Service: Thoracic;  Laterality: N/A;    Current Outpatient Prescriptions  Medication Sig Dispense Refill  . acetaminophen (TYLENOL) 500 MG tablet Take 1,000 mg by mouth every 6 (six) hours as needed for mild pain.    [name] albuterol (PROVENTIL HFA;VENTOLIN HFA) 108 (90 Base) MCG/ACT inhaler Inhale 2 puffs into the lungs every 4 (four) hours as needed for wheezing or shortness of breath. 1 Inhaler 2  . amiodarone (PACERONE) 100 MG tablet TAKE 1 TABLET BY MOUTH DAILY 90 tablet 1  . atorvastatin (LIPITOR) 20 MG tablet TAKE 1 TABLET BY MOUTH EVERY DAY 90 tablet 3  . buPROPion (WELLBUTRIN SR) 150 MG 12 hr tablet TAKE 1 TABLET (150 MG TOTAL) BY MOUTH 2 (TWO) TIMES DAILY. (Patient taking differently: Take 150 mg by mouth 2 (two) times daily. ) 180 tablet 2  . carvedilol (COREG) 25 MG tablet Take 1 tablet (25 mg total) by mouth 2 (two) times daily with a meal. 60 tablet 0  . [employer] D3 2000 units CAPS Take 2,000 Units by mouth daily.   11  . feeding supplement, ENSURE ENLIVE, (ENSURE ENLIVE) LIQD Take 237 mLs by mouth 2 (two) times daily between meals. 237 mL 12  . ferrous gluconate (FERGON) 324 MG tablet Take 1 tablet (324 mg total) by mouth 3 (three) times daily with meals. 90 tablet 8  . Fluticasone-Umeclidin-Vilant (TRELEGY ELLIPTA) 100-62.5-25 MCG/INH AEPB Inhale 1 puff into the lungs daily. 1 each 6  . furosemide (LASIX) 20 MG tablet Take 3 tabs (60 mg) every Tue and Sat, all other days take 2 tabs (40 mg) (Patient taking differently: Take 40-60 mg by mouth See admin instructions. Take 3 tabs (60 mg) every Tue and Sat, all  other days take 2 tabs (40 mg)) 64 tablet 6  . hydrALAZINE (APRESOLINE) 100 MG tablet Take 100 mg by mouth 3 (three) times daily.   1  . levothyroxine (SYNTHROID, LEVOTHROID) 50 MCG tablet TAKE 1 TABLET (50 MCG TOTAL) BY MOUTH DAILY BEFORE BREAKFAST. 90 tablet 3  . lidocaine-prilocaine (EMLA) cream Apply generous amount to port site at least 1 hr prior to treatment.  DO NOT RUB IN. 30 g 1  . lisinopril (PRINIVIL,ZESTRIL) 40 MG tablet TAKE 1 TABLET BY MOUTH DAILY. 90 tablet 2  . magnesium oxide (MAG-OX) 400 (241.3 Mg) MG tablet Take 1 tablet (400 mg total) by mouth daily. 30 tablet 0  . potassium chloride SA (KLOR-CON M20) 20 MEQ tablet Take 2 tablets (40 mEq total) by mouth daily. 60 tablet 0  .  prochlorperazine (COMPAZINE) 10 MG tablet Take 1 tablet (10 mg total) by mouth every 6 (six) hours as needed for nausea or vomiting. 30 tablet 0  . rivaroxaban (XARELTO) 20 MG TABS tablet Take 1 tablet (20 mg total) by mouth daily. 90 tablet 2  . spironolactone (ALDACTONE) 25 MG tablet TAKE 0.5 TABLETS (12.5 MG TOTAL) BY MOUTH DAILY. 45 tablet 2  . ULORIC 40 MG tablet Take 1 tablet (40 mg total) by mouth daily. 30 tablet 0   No current facility-administered medications for this visit.     Allergies:   Patient has no known allergies.   Social History:  The patient  reports that he quit smoking about 6 months ago. His smoking use included Cigarettes. He has a 11.25 pack-year smoking history. He has never used smokeless tobacco. He reports that he drinks about 0.6 oz of alcohol per week . He reports that he does not use drugs.   Family History:  The patient's family history includes Heart Problems in his mother; Hypertension in his mother; Lung cancer in his brother; Other in his father.  ROS:  Please see the history of present illness.  All other systems are reviewed and otherwise negative.   PHYSICAL EXAM:  VS:  BP 120/68   Pulse 91   Ht 6\' 1"  (1.854 m)   Wt 262 lb (118.8 kg)   BMI 34.57 kg/m   BMI: Body mass index is 34.57 kg/m. Well nourished, well developed, in no acute distress  HEENT: normocephalic, atraumatic  Neck: no JVD, carotid bruits or masses Cardiac:  RRR; no significant murmurs, no rubs, or gallops Lungs:  CTA b/l, no wheezing, rhonchi or rales  Abd: soft, non-tender, obese MS: no deformity or atrophy Ext:  no edema  Skin: warm and dry, no rash Neuro:  No gross deficits appreciated Psych: euthymic mood, full affect   ICD site is stable, no tethering or discomfort   EKG:   Not done today, hospital EKG reviewed ICD interrogation done today and reviewed by myself: battery and lead measurements are good VT episode, ATP x3, shock x1 successful  12/28/14: TTE Study Conclusions - Left ventricle: The cavity size was moderately dilated. There was   mild concentric hypertrophy. Systolic function was moderately to   severely reduced. The estimated ejection fraction was in the   range of 30% to 35%. Moderate diffuse hypokinesis with no   identifiable regional variations. Features are consistent with a   pseudonormal left ventricular filling pattern, with concomitant   abnormal relaxation and increased filling pressure (grade 2   diastolic dysfunction). - Aortic valve: There was mild to moderate regurgitation. - Left atrium: The atrium was moderately dilated. - Right ventricle: The cavity size was mildly dilated. Wall   thickness was normal. - Atrial septum: There was an atrial septal aneurysm. - Pulmonary arteries: Systolic pressure was mildly increased. PA   peak pressure: 37 mm Hg (S).   Recent Labs: 06/26/2016: NT-Pro BNP 1,996 01/04/2017: TSH 1.628 01/07/2017: ALT 65; B Natriuretic Peptide 381.1 01/09/2017: BUN 19; Creatinine, Ser 1.58; Hemoglobin 9.5; Magnesium 1.8; Platelets PLATELET CLUMPS NOTED ON SMEAR, UNABLE TO ESTIMATE; Potassium 3.6; Sodium 140  No results found for requested labs within last 8760 hours.   Estimated Creatinine Clearance: 63 mL/min  (A) (by C-G formula based on SCr of 1.58 mg/dL (H)).   Wt Readings from Last 3 Encounters:  01/15/17 262 lb (118.8 kg)  01/09/17 264 lb 4.8 oz (119.9 kg)  01/04/17 265 lb 4.8 oz (  120.3 kg)     Other studies reviewed: Additional studies/records reviewed today include: summarized above  ASSESSMENT AND PLAN:  1. VT     Reviewed with Dr. Rayann Heman last week, appears to be VT     Agree with electrolyte correction and up-titration of his BB     On low dose amio     The patient is made aware of [place] law, no driving for 6 months  Check BMET and Mag today TSH was ok last week, AST.ALT were mildly elevated though down from prior, follow  2. ICD     Intact function, no changes today  3. NICM     C/o decreased stamina, getting winded easily since starting his new Cancer therapy/weight loss.     Exam does not suggest fluid OL, CorVue looks like he had been but is at baseline.  CXR last week without evidence of CHF  DOE new for him, suspect more likely due to poor PO intake Will check an echo       4. HTN     Looks OK, no changes  5. Paroxysmal Afib     CHA2DS2Vasc is at least 3, on xarelto, appropriately dose with Calc. Cr. Cl of 78   Disposition: Will f/u in 6 weeks sooner if needed.  Will f/u LFTs then    Current medicines are reviewed at length with the patient today.  The patient did not have any concerns regarding medicines.  Venetia Night, PA-C 01/15/2017 8:36 AM     [address] [id number] [phone] (office)  [phone] (fax)

## 2017-01-15 ENCOUNTER — Ambulatory Visit (INDEPENDENT_AMBULATORY_CARE_PROVIDER_SITE_OTHER): Payer: Medicare Other | Admitting: Physician Assistant

## 2017-01-15 VITALS — BP 120/68 | HR 91 | Ht 73.0 in | Wt 262.0 lb

## 2017-01-15 DIAGNOSIS — I10 Essential (primary) hypertension: Secondary | ICD-10-CM

## 2017-01-15 DIAGNOSIS — I4891 Unspecified atrial fibrillation: Secondary | ICD-10-CM | POA: Diagnosis not present

## 2017-01-15 DIAGNOSIS — I480 Paroxysmal atrial fibrillation: Secondary | ICD-10-CM | POA: Diagnosis not present

## 2017-01-15 DIAGNOSIS — I472 Ventricular tachycardia, unspecified: Secondary | ICD-10-CM

## 2017-01-15 DIAGNOSIS — I428 Other cardiomyopathies: Secondary | ICD-10-CM | POA: Diagnosis not present

## 2017-01-15 DIAGNOSIS — R0602 Shortness of breath: Secondary | ICD-10-CM

## 2017-01-15 DIAGNOSIS — Z95810 Presence of automatic (implantable) cardiac defibrillator: Secondary | ICD-10-CM | POA: Diagnosis not present

## 2017-01-15 LAB — BASIC METABOLIC PANEL
BUN/Creatinine Ratio: 18 (ref 10–24)
BUN: 28 mg/dL — AB (ref 8–27)
CALCIUM: 9.7 mg/dL (ref 8.6–10.2)
CHLORIDE: 104 mmol/L (ref 96–106)
CO2: 24 mmol/L (ref 20–29)
Creatinine, Ser: 1.56 mg/dL — ABNORMAL HIGH (ref 0.76–1.27)
GFR calc non Af Amer: 46 mL/min/{1.73_m2} — ABNORMAL LOW (ref >59)
GFR, EST AFRICAN AMERICAN: 53 mL/min/{1.73_m2} — AB (ref >59)
GLUCOSE: 100 mg/dL — AB (ref 65–99)
POTASSIUM: 4.1 mmol/L (ref 3.5–5.2)
Sodium: 144 mmol/L (ref 134–144)

## 2017-01-15 LAB — MAGNESIUM: MAGNESIUM: 1.8 mg/dL (ref 1.6–2.3)

## 2017-01-15 NOTE — Patient Instructions (Signed)
Medication Instructions:   Your physician recommends that you continue on your current medications as directed. Please refer to the Current Medication list given to you today.   If you need a refill on your cardiac medications before your next appointment, please call your pharmacy.   Labwork: BMET AND MAG TODAY     Testing/Procedures: Your physician has requested that you have an echocardiogram. Echocardiography is a painless test that uses sound waves to create images of your heart. It provides your doctor with information about the size and shape of your heart and how well your heart's chambers and valves are working. This procedure takes approximately one hour. There are no restrictions for this procedure.     Follow-Up:  IN 6 WEEKS WITH URSUY   Any Other Special Instructions Will Be Listed Below (If Applicable).

## 2017-01-16 ENCOUNTER — Telehealth: Payer: Self-pay | Admitting: Internal Medicine

## 2017-01-16 NOTE — Telephone Encounter (Signed)
Patient returned my call and I notified him of his results. No further questions at this time.

## 2017-01-16 NOTE — Telephone Encounter (Signed)
Follow Up:; ° ° °Returning your call. °

## 2017-01-18 ENCOUNTER — Ambulatory Visit (HOSPITAL_BASED_OUTPATIENT_CLINIC_OR_DEPARTMENT_OTHER): Payer: Medicare Other

## 2017-01-18 ENCOUNTER — Ambulatory Visit: Payer: Medicare Other | Admitting: Nutrition

## 2017-01-18 ENCOUNTER — Other Ambulatory Visit (HOSPITAL_BASED_OUTPATIENT_CLINIC_OR_DEPARTMENT_OTHER): Payer: Medicare Other

## 2017-01-18 ENCOUNTER — Ambulatory Visit (HOSPITAL_BASED_OUTPATIENT_CLINIC_OR_DEPARTMENT_OTHER): Payer: Medicare Other | Admitting: Oncology

## 2017-01-18 VITALS — BP 139/75 | HR 90 | Temp 98.4°F | Resp 19 | Ht 73.0 in | Wt 277.8 lb

## 2017-01-18 DIAGNOSIS — R634 Abnormal weight loss: Secondary | ICD-10-CM | POA: Diagnosis not present

## 2017-01-18 DIAGNOSIS — Z5112 Encounter for antineoplastic immunotherapy: Secondary | ICD-10-CM

## 2017-01-18 DIAGNOSIS — C3411 Malignant neoplasm of upper lobe, right bronchus or lung: Secondary | ICD-10-CM | POA: Diagnosis not present

## 2017-01-18 DIAGNOSIS — R5383 Other fatigue: Secondary | ICD-10-CM

## 2017-01-18 LAB — CBC WITH DIFFERENTIAL/PLATELET
BASO%: 0.3 % (ref 0.0–2.0)
Basophils Absolute: 0 10*3/uL (ref 0.0–0.1)
EOS%: 2.2 % (ref 0.0–7.0)
Eosinophils Absolute: 0.1 10*3/uL (ref 0.0–0.5)
HCT: 30.2 % — ABNORMAL LOW (ref 38.4–49.9)
HGB: 9.5 g/dL — ABNORMAL LOW (ref 13.0–17.1)
LYMPH%: 8.4 % — AB (ref 14.0–49.0)
MCH: 29.1 pg (ref 27.2–33.4)
MCHC: 31.5 g/dL — ABNORMAL LOW (ref 32.0–36.0)
MCV: 92.6 fL (ref 79.3–98.0)
MONO#: 1 10*3/uL — ABNORMAL HIGH (ref 0.1–0.9)
MONO%: 17.2 % — AB (ref 0.0–14.0)
NEUT#: 4.3 10*3/uL (ref 1.5–6.5)
NEUT%: 71.9 % (ref 39.0–75.0)
Platelets: 169 10*3/uL (ref 140–400)
RBC: 3.26 10*6/uL — ABNORMAL LOW (ref 4.20–5.82)
RDW: 16.2 % — ABNORMAL HIGH (ref 11.0–14.6)
WBC: 6 10*3/uL (ref 4.0–10.3)
lymph#: 0.5 10*3/uL — ABNORMAL LOW (ref 0.9–3.3)

## 2017-01-18 LAB — COMPREHENSIVE METABOLIC PANEL
ALBUMIN: 2.8 g/dL — AB (ref 3.5–5.0)
ALK PHOS: 134 U/L (ref 40–150)
ALT: 32 U/L (ref 0–55)
ANION GAP: 8 meq/L (ref 3–11)
AST: 24 U/L (ref 5–34)
BILIRUBIN TOTAL: 0.3 mg/dL (ref 0.20–1.20)
BUN: 25.6 mg/dL (ref 7.0–26.0)
CALCIUM: 9.5 mg/dL (ref 8.4–10.4)
CO2: 26 mEq/L (ref 22–29)
Chloride: 108 mEq/L (ref 98–109)
Creatinine: 1.4 mg/dL — ABNORMAL HIGH (ref 0.7–1.3)
Glucose: 95 mg/dl (ref 70–140)
Potassium: 3.8 mEq/L (ref 3.5–5.1)
SODIUM: 142 meq/L (ref 136–145)
TOTAL PROTEIN: 7 g/dL (ref 6.4–8.3)

## 2017-01-18 MED ORDER — SODIUM CHLORIDE 0.9 % IV SOLN
9.5000 mg/kg | Freq: Once | INTRAVENOUS | Status: AC
  Administered 2017-01-18: 1240 mg via INTRAVENOUS
  Filled 2017-01-18: qty 20

## 2017-01-18 MED ORDER — SODIUM CHLORIDE 0.9% FLUSH
10.0000 mL | INTRAVENOUS | Status: DC | PRN
  Administered 2017-01-18: 10 mL
  Filled 2017-01-18: qty 10

## 2017-01-18 MED ORDER — HEPARIN SOD (PORK) LOCK FLUSH 100 UNIT/ML IV SOLN
500.0000 [IU] | Freq: Once | INTRAVENOUS | Status: AC | PRN
Start: 2017-01-18 — End: 2017-01-18
  Administered 2017-01-18: 500 [IU]
  Filled 2017-01-18: qty 5

## 2017-01-18 MED ORDER — SODIUM CHLORIDE 0.9 % IV SOLN
Freq: Once | INTRAVENOUS | Status: AC
  Administered 2017-01-18: 13:00:00 via INTRAVENOUS

## 2017-01-18 NOTE — Progress Notes (Signed)
[facility] Follow up:    Jason Lei, Jason Russell [address] [place] [address]   DIAGNOSIS: Stage IIIA (T1a, N2, M0) non-small cell lung cancer, adenocarcinoma presented with right upper lobe lung nodule in addition to mediastinal lymphadenopathy diagnosed in March 2018.  Biomarker Findings Tumor Mutational Burden - TMB-Intermediate (8 Muts/Mb) Microsatellite Status - MS-Stable Genomic Findings For a complete list of the genes assayed, please refer to the Appendix. ERBB2 amplification - equivocal? DNMT3A K481f*192 FUBP1 Q40* KEAP1 [id number] TP53 C275F 7 Disease relevant genes with no reportable alterations: EGFR, KRAS, ALK, BRAF, MET, RET, ROS1  SUMMARY OF ONCOLOGIC HISTORY: Oncology History   Patient presented with abnormal LDCT screening 07/11/16.     Primary cancer of right upper lobe of lung ([facility]  07/11/2016 Imaging    IMPRESSION: LDCT  Lung-RADS Category 4B, suspicious. Additional imaging evaluation or consultation with pulmonary medicine or thoracic surgery recommended. Right apical pulmonary nodule of volume derived equivalent diameter 13.6 mm. Concurrent mediastinal adenopathy      07/31/2016 Imaging    PET IMPRESSION:  Markedly hypermetabolic posterior right upper lobe pulmonary lesion consistent with malignancy and hypermetabolic mediastinal lymph node metastases.      08/09/2016 Surgery    EBUS Procedure: Level 4R:  1.5 cm lymph node / biopsy performed / ROSE / passes 4 - 6 for cytology & cell block / passes 7-9 for cell block only Level 4L:  No lymph node visualized Level 7:  2 cm lymph node / biopsy performed / ROSE / passes 1 - 3 for cytology & cell block / passes 4 & 5 for cell block only       08/09/2016 Pathology Results    Diagnosis FINE NEEDLE ASPIRATION, EBUS 4R (SPECIMEN 2 OF 2, COLLECTED 08/09/16): MALIGNANT CELLS CONSISTENT WITH ADENOCARCINOMA.       08/23/2016 Initial Diagnosis    Primary cancer of right upper  lobe of lung (HCC)     PRIOR THERAPY: course of concurrent chemoradiation with weekly carboplatin for AUC of 2 and paclitaxel 45 MG/M2. Status post 6 cycles. Last cycle was given 10/16/2016.  CURRENT THERAPY: Consolidation treatment with immunotherapy with Imfinzi (Durvalumab) 10 MG/M2 every 2 weeks. First dose 12/21/2016. Status post 1 cycle.  INTERVAL HISTORY: Jason Russell.o. male returns for routine follow-up by himself. Treatment was held 2 weeks ago due to fatigue and weight loss. The patient reports that he his feeling better today. He is not as fatigue and is gaining back some of his lost weight. He is trying to eat better. Since his last visit, he was admitted to the hospital for defibrillator discharge. He was found to have abnormal electrolytes which were replaced. He is being followed by cardiology. He denies fevers and chills. Denies chest pain, shortness of breath at rest, cough, hemoptysis. Reports dyspnea on exertion. Denies nausea, vomiting, constipation, diarrhea. The patient is here for evaluation prior to cycle #2 of his treatment.   Patient Active Problem List   Diagnosis Date Noted  . Weight loss, non-intentional 01/08/2017  . Defibrillator discharge 01/08/2017  . Encounter for antineoplastic immunotherapy 11/27/2016  . Chronic fatigue 10/18/2016  . Port catheter in place 09/25/2016  . Encounter for antineoplastic chemotherapy 08/24/2016  . Goals of care, counseling/discussion 08/24/2016  . [facility] lobe of [facility] ([facility]  . Lung nodule < 6cm on CT   . Mediastinal adenopathy   . Lung nodule 07/21/2016  . Right rotator cuff  tear 06/05/2016  . Obesity 10/07/2015  . COPD (chronic obstructive pulmonary disease) ([facility]) 10/07/2015  . Exertional dyspnea 10/07/2015  . Anemia 10/29/2013  . Internal hemorrhoids without mention of complication 57/32/2025  . Iron deficiency anemia, unspecified 10/16/2013  . Symptomatic anemia 10/15/2013  .  Nonischemic cardiomyopathy ([place]) 08/16/2012  . Shortness of breath dyspnea 11/01/2011  . Chronic anticoagulation 11/01/2011  . Chronic systolic heart failure ([place]) 11/01/2011  . Obstructive sleep apnea 12/20/2009  . Atrial fibrillation ([place]) 09/24/2009  . HYPERPOTASSEMIA 08/04/2009  . HYPOPOTASSEMIA 07/26/2009  . Hyperlipidemia 07/15/2009  . OVERWEIGHT/OBESITY 07/15/2009  . TOBACCO ABUSE 07/15/2009  . HYPERTENSION, BENIGN 07/15/2009    has No Known Allergies.  MEDICAL HISTORY: Past Medical History:  Diagnosis Date  . Adenocarcinoma of right lung, stage 3 ([place]) 08/23/2016  . Adenocarcinoma of right lung, stage 3 ([place]) 08/23/2016  . Anemia   . Arthritis    "hx right hip"  . Asthma    "when I was a child"  . Atrial fibrillation (HCC)    Amiodarone started 10/2011; Coumadin  . 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 St. Jude ICD implantation 10/03/12.  . Chronic anticoagulation   . Chronic fatigue 10/18/2016  . Chronic fatigue 10/18/2016  . Chronic systolic heart failure ([place])    a. Echo 7/13: EF 25%;  b. echo 04/2012:  Mild LVH, EF 30-35%, Gr 1 DD, mild AI, mild MR, mild LAE  . COPD (chronic obstructive pulmonary disease) ([place])   . Dyslipidemia   . Dysrhythmia   . Encounter for antineoplastic chemotherapy 08/24/2016  . Gout   . History of blood transfusion 10/15/2013   "don't know where the blood's going; HgB down to 5"  . Hyperlipidemia   . Hypertension   . Hypothyroidism   . NICM (nonischemic cardiomyopathy) ([place])    [place] 4/14:  minimal CAD  . Obesity   . OSA on CPAP   . Tobacco abuse     SURGICAL HISTORY: Past Surgical History:  Procedure Laterality Date  . CARDIAC DEFIBRILLATOR PLACEMENT  2014  . CARDIOVERSION  2011  . COLONOSCOPY WITH PROPOFOL Left 10/17/2013   Procedure: COLONOSCOPY WITH PROPOFOL;  Surgeon: Inda Castle, Jason Russell;  Location: [facility];  Service: Endoscopy;  Laterality: Left;  . ESOPHAGOGASTRODUODENOSCOPY N/A 10/17/2013    Procedure: ESOPHAGOGASTRODUODENOSCOPY (EGD);  Surgeon: Inda Castle, Jason Russell;  Location: [facility];  Service: Endoscopy;  Laterality: N/A;  . GIVENS CAPSULE STUDY N/A 10/29/2013   Procedure: GIVENS CAPSULE STUDY;  Surgeon: Inda Castle, Jason Russell;  Location: [facility];  Service: Endoscopy;  Laterality: N/A;  . IMPLANTABLE CARDIOVERTER DEFIBRILLATOR IMPLANT Left 10/03/2012   Procedure: IMPLANTABLE CARDIOVERTER DEFIBRILLATOR IMPLANT;  Surgeon: Deboraha Sprang, Jason Russell;  Location[facility] C[facility] LAB;  Service: Cardiovascular;  Laterality: Left;  . IR FLUORO GUIDE PORT INSERTION RIGHT  09/21/2016  . IR US GUIDE VASC ACCESS RIGHT  09/21/2016  . JOINT REPLACEMENT    . TONSILLECTOMY  1950's  . TOTAL HIP ARTHROPLASTY Right 11/25/1997  . VIDEO BRONCHOSCOPY WITH ENDOBRONCHIAL ULTRASOUND N/A 08/09/2016   Procedure: VIDEO BRONCHOSCOPY WITH ENDOBRONCHIAL ULTRASOUND;  Surgeon: Javier Glazier, Jason Russell;  Location: [facility];  Service: Thoracic;  Laterality: N/A;    SOCIAL HISTORY: Social History   Social History  . Marital status: Married    Spouse name: N/A  . Number of children: 1  . Years of education: N/A   Occupational History  . [employer] - Freight forwarder [employer]   Social History Main Topics  .  Smoking status: Former Smoker    Packs/day: 0.25    Years: 45.00    Types: Cigarettes    Quit date: 07/11/2016  . Smokeless tobacco: Never Used  . Alcohol use 0.6 oz/week    1 Cans of beer per week  . Drug use: No  . Sexual activity: Not Currently   Other Topics Concern  . Not on file   Social History Narrative  . No narrative on file    FAMILY HISTORY: Family History  Problem Relation Age of Onset  . Hypertension Mother   . Heart Problems Mother   . Other Father        deceased- unknow reason  . Lung cancer Brother     Review of Systems  Constitutional: Negative.   HENT:  Negative.   Eyes: Negative.   Respiratory: Negative for cough and hemoptysis.        Dyspnea on exertion.   Cardiovascular: Negative.   Gastrointestinal: Negative.   Genitourinary: Negative.    Musculoskeletal: Negative.   Skin: Negative.   Neurological: Negative.   Hematological: Negative.   Psychiatric/Behavioral: Negative.       PHYSICAL EXAMINATION  ECOG PERFORMANCE STATUS: 1 - Symptomatic but completely ambulatory  Vitals:   01/18/17 1128  BP: 139/75  Pulse: 90  Resp: 19  Temp: 98.4 F (36.9 C)  SpO2: 99%    Physical Exam  Constitutional: He is oriented to person, place, and time and well-developed, well-nourished, and in no distress. No distress.  HENT:  Head: Normocephalic and atraumatic.  Mouth/Throat: Oropharynx is clear and moist. No oropharyngeal exudate.  Eyes: Conjunctivae are normal. Right eye exhibits no discharge. Left eye exhibits no discharge. No scleral icterus.  Neck: Normal range of motion. Neck supple.  Cardiovascular: Normal rate, regular rhythm, normal heart sounds and intact distal pulses.   Pulmonary/Chest: Effort normal and breath sounds normal. No respiratory distress. He has no wheezes. He has no rales.  Abdominal: Soft. Bowel sounds are normal. He exhibits no distension and no mass. There is no tenderness.  Musculoskeletal: Normal range of motion. He exhibits no edema.  Lymphadenopathy:    He has no cervical adenopathy.  Neurological: He is alert and oriented to person, place, and time. He exhibits normal muscle tone. Gait normal. Coordination normal.  Skin: Skin is dry. No rash noted. He is not diaphoretic. No erythema.  Psychiatric: Mood, memory, affect and judgment normal.  Vitals reviewed.   LABORATORY DATA:  CBC    Component Value Date/Time   WBC 6.0 01/18/2017 1107   WBC 5.2 01/09/2017 0403   RBC 3.26 (L) 01/18/2017 1107   RBC 3.37 (L) 01/09/2017 0403   HGB 9.5 (L) 01/18/2017 1107   HCT 30.2 (L) 01/18/2017 1107   PLT 169 01/18/2017 1107   PLT 182 07/21/2016 1429   MCV 92.6 01/18/2017 1107   MCH 29.1 01/18/2017 1107   MCH 28.2  01/09/2017 0403   MCHC 31.5 (L) 01/18/2017 1107   MCHC 31.0 01/09/2017 0403   RDW 16.2 (H) 01/18/2017 1107   LYMPHSABS 0.5 (L) 01/18/2017 1107   MONOABS 1.0 (H) 01/18/2017 1107   EOSABS 0.1 01/18/2017 1107   EOSABS 0.1 07/21/2016 1429   BASOSABS 0.0 01/18/2017 1107    CMP     Component Value Date/Time   NA 142 01/18/2017 1107   K 3.8 01/18/2017 1107   CL 104 01/15/2017 0854   CO2 26 01/18/2017 1107   GLUCOSE 95 01/18/2017 1107   BUN 25.6 01/18/2017 1107  CREATININE 1.4 (H) 01/18/2017 1107   CALCIUM 9.5 01/18/2017 1107   PROT 7.0 01/18/2017 1107   ALBUMIN 2.8 (L) 01/18/2017 1107   AST 24 01/18/2017 1107   ALT 32 01/18/2017 1107   ALKPHOS 134 01/18/2017 1107   BILITOT 0.30 01/18/2017 1107   GFRNONAA 46 (L) 01/15/2017 0854   GFRAA 53 (L) 01/15/2017 0854    RADIOGRAPHIC STUDIES:  No results found.  ASSESSMENT and THERAPY PLAN:   Primary cancer of right upper lobe of lung ([place]) This is a very pleasant 66year old African-American male with a stage IIIa non-small cell lung cancer, adenocarcinoma.  The patient completed 6 weeks of concurrent chemoradiation with weekly carboplatin and paclitaxel and tolerated his treatment well except for odynophagia.  The patient is status post one cycle of consolidation immunotherapy with Imfinzi (Durvalumab). Treatment was held at his last visit due to fatigue and weight loss.He also had elevated liver enzymes noted on his labs.  The patient is feeling better today and his liver enzymes are normal. Patient would like to try Imfinzi again to see how he feels. Recommend the patient proceed with cycle 2 of Imfinzi today as scheduled.  We will plan to bring him back in 2 weeks for evaluation prior to cycle 3 of his immunotherapy.  He was advised to call immediately if he has any concerning symptoms in the interval. The patient voices understanding of current disease status and treatment options and is in agreement with the current care  plan. All questions were answered. The patient knows to call the clinic with any problems, questions or concerns. We can certainly see the patient much sooner if necessary.   No orders of the defined types were placed in this encounter.   All questions were answered. The patient knows to call the clinic with any problems, questions or concerns. We can certainly see the patient much sooner if necessary.  Mikey Bussing, NP 01/19/2017

## 2017-01-18 NOTE — Patient Instructions (Signed)
[facility] Discharge Instructions for Patients Receiving Chemotherapy  Today you received the following chemotherapy agents: Durvalumab   To help prevent nausea and vomiting after your treatment, we encourage you to take your nausea medication as directed.    If you develop nausea and vomiting that is not controlled by your nausea medication, call the clinic.   BELOW ARE SYMPTOMS THAT SHOULD BE REPORTED IMMEDIATELY:  *FEVER GREATER THAN 100.5 F  *CHILLS WITH OR WITHOUT FEVER  NAUSEA AND VOMITING THAT IS NOT CONTROLLED WITH YOUR NAUSEA MEDICATION  *UNUSUAL SHORTNESS OF BREATH  *UNUSUAL BRUISING OR BLEEDING  TENDERNESS IN MOUTH AND THROAT WITH OR WITHOUT PRESENCE OF ULCERS  *URINARY PROBLEMS  *BOWEL PROBLEMS  UNUSUAL RASH Items with * indicate a potential emergency and should be followed up as soon as possible.  Feel free to call the clinic you have any questions or concerns. The clinic phone number is [phone].  Please show the [facility] at check-in to the Emergency Department and triage nurse.     Durvalumab injection What is this medicine? DURVALUMAB (dur VAL ue mab) is a monoclonal antibody. It is used to treat urothelial cancer. This medicine may be used for other purposes; ask your health care provider or pharmacist if you have questions. COMMON BRAND NAME(S): IMFINZI What should I tell my health care provider before I take this medicine? They need to know if you have any of these conditions: -diabetes -immune system problems -infection -inflammatory bowel disease -kidney disease -liver disease -lung or breathing disease -lupus -organ transplant -stomach or intestine problems -thyroid disease -an unusual or allergic reaction to durvalumab, other medicines, foods, dyes, or preservatives -pregnant or trying to get pregnant -breast-feeding How should I use this medicine? This medicine is for infusion into a vein. It is given by a  health care professional in a hospital or clinic setting. A special MedGuide will be given to you before each treatment. Be sure to read this information carefully each time. Talk to your pediatrician regarding the use of this medicine in children. Special care may be needed. Overdosage: If you think you have taken too much of this medicine contact a poison control center or emergency room at once. NOTE: This medicine is only for you. Do not share this medicine with others. What if I miss a dose? It is important not to miss your dose. Call your doctor or health care professional if you are unable to keep an appointment. What may interact with this medicine? Interactions have not been studied. This list may not describe all possible interactions. Give your health care provider a list of all the medicines, herbs, non-prescription drugs, or dietary supplements you use. Also tell them if you smoke, drink alcohol, or use illegal drugs. Some items may interact with your medicine. What should I watch for while using this medicine? This drug may make you feel generally unwell. Continue your course of treatment even though you feel ill unless your doctor tells you to stop. You may need blood work done while you are taking this medicine. Do not become pregnant while taking this medicine or for 3 months after stopping it. Women should inform their doctor if they wish to become pregnant or think they might be pregnant. There is a potential for serious side effects to an unborn child. Talk to your health care professional or pharmacist for more information. Do not breast-feed an infant while taking this medicine or for 3 months after stopping it.  What side effects may I notice from receiving this medicine? Side effects that you should report to your doctor or health care professional as soon as possible: -allergic reactions like skin rash, itching or hives, swelling of the face, lips, or tongue -black, tarry  stools -bloody or watery diarrhea -breathing problems -change in emotions or moods -change in sex drive -changes in vision -chest pain or chest tightness -chills -confusion -cough -facial flushing -fever -headache -signs and symptoms of high blood sugar such as dizziness; dry mouth; dry skin; fruity breath; nausea; stomach pain; increased hunger or thirst; increased urination -signs and symptoms of liver injury like dark yellow or brown urine; general ill feeling or flu-like symptoms; light-colored stools; loss of appetite; nausea; right upper belly pain; unusually weak or tired; yellowing of the eyes or skin -stomach pain -trouble passing urine or change in the amount of urine -weight gain or weight loss Side effects that usually do not require medical attention (report these to your doctor or health care professional if they continue or are bothersome): -bone pain -constipation -loss of appetite -muscle pain -nausea -swelling of the ankles, feet, hands -tiredness This list may not describe all possible side effects. Call your doctor for medical advice about side effects. You may report side effects to FDA at 1-800-FDA-1088. Where should I keep my medicine? This drug is given in a hospital or clinic and will not be stored at home. NOTE: This sheet is a summary. It may not cover all possible information. If you have questions about this medicine, talk to your doctor, pharmacist, or health care provider.  2018 Elsevier/Gold Standard (2015-10-22 15:50:36)

## 2017-01-18 NOTE — Progress Notes (Signed)
Patient was identified to be at risk for malnutrition on the MST secondary to poor appetite and weight loss.  Patient is a 66 year old male diagnosed with stage III lung cancer in May 2018  Past medical history includes obesity, gout, dyslipidemia, COPD, and fatigue.  Medications include Lipitor, Wellbutrin, Lasix, Synthroid, and magnesium oxide.  Labs were reviewed.  Height: 6 feet 1 inch. Weight: 277.8 pounds October 18 which has improved from 262 pounds October 15. Noted weight in June 2018 of 292 pounds. BMI: 36.65.  Patient reports his appetite is good.  States he is eating well.  He denies nutrition impact symptoms. He has no questions or concerns at this time. Provided brief education on the importance of small frequent meals with adequate protein and calories to minimize weight loss throughout treatment.  Provide contact information in case patient develops questions. No further counseling required at this time.  **Disclaimer: This note was dictated with voice recognition software. Similar sounding words can inadvertently be transcribed and this note may contain transcription errors which may not have been corrected upon publication of note.**

## 2017-01-19 NOTE — Assessment & Plan Note (Signed)
This is a very pleasant 66year old African-American male with a stage IIIa non-small cell lung cancer, adenocarcinoma.  The patient completed 6 weeks of concurrent chemoradiation with weekly carboplatin and paclitaxel and tolerated his treatment well except for odynophagia.  The patient is status post one cycle of consolidation immunotherapy with Imfinzi (Durvalumab). Treatment was held at his last visit due to fatigue and weight loss.He also had elevated liver enzymes noted on his labs.  The patient is feeling better today and his liver enzymes are normal. Patient would like to try Imfinzi again to see how he feels. Recommend the patient proceed with cycle 2 of Imfinzi today as scheduled.  We will plan to bring him back in 2 weeks for evaluation prior to cycle 3 of his immunotherapy.  He was advised to call immediately if he has any concerning symptoms in the interval. The patient voices understanding of current disease status and treatment options and is in agreement with the current care plan. All questions were answered. The patient knows to call the clinic with any problems, questions or concerns. We can certainly see the patient much sooner if necessary.

## 2017-01-22 ENCOUNTER — Other Ambulatory Visit: Payer: Self-pay | Admitting: Internal Medicine

## 2017-01-22 ENCOUNTER — Ambulatory Visit (INDEPENDENT_AMBULATORY_CARE_PROVIDER_SITE_OTHER): Payer: Medicare Other

## 2017-01-22 DIAGNOSIS — Z95810 Presence of automatic (implantable) cardiac defibrillator: Secondary | ICD-10-CM

## 2017-01-22 DIAGNOSIS — I5022 Chronic systolic (congestive) heart failure: Secondary | ICD-10-CM

## 2017-01-22 NOTE — Progress Notes (Signed)
EPIC Encounter for ICM Monitoring  Patient Name: Oneal Schoenberger is a 66 y.o. male Date: 01/22/2017 Primary Care Physican: Lucianne Lei, MD Primary Cardiologist:Ross Electrophysiologist: Faustino Congress Weight:277 lbs        Heart Failure questions reviewed, pt asymptomatic.  Patient had defibrillator shock   Thoracic impedance normal but was abnormal suggesting fluid accumulation from 01/08/2017 to 01/14/2017 and 01/17/2017 to 01/19/2017.  Prescribed dosage: Furosemide 20 mg 3 tablets (60 mg total) every Tuesday and Saturday and 40 mg all the other days. Potassium 20 mEq 1 tablet every day and on days when lasix is taken, take 2 tablets  Labs: 12/21/2016 Creatinine 1.2,   BUN 22.8, Potassium 3.7, Sodium 141, EGFR 52 11/10/2016 Creatinine 1.7, BUN 23, Potassium 3.6, Sodium 143, EGFR 49 10/16/2016 Creatinine 2.0, BUN 28.4, Potassium 4.1, Sodium 141, EGFR 40  10/09/2016 Creatinine 1.5, BUN 25.7, Potassium 4.1, Sodium 143, EGFR 55  10/02/2016 Creatinine 1.6, BUN 20.4, Potassium 3.9, Sodium 147, EGFR 53  09/25/2016 Creatinine 1.3, BUN 21, Potassium 4.1, Sodium 142, EGFR 65  09/21/2016 Creatinine 1.50, BUN 29, Potassium 3.7, Sodium 143  09/18/2016 Creatinine 1.9, BUN 25.8, Potassium 3.2, Sodium 144, EGFR 43  09/11/2016 Creatinine 1.6, BUN 32.9, Potassium 3.7, Sodium 142, EGFR 51  09/05/2016 Creatinine 1.6, BUN 19,Potassium 3.3, Sodium 145 08/24/2016 Creatinine 1.6, BUN 22,Potassium 3.4, Sodium 143  Recommendations: No changes.   Encouraged to call for fluid symptoms.  Follow-up plan: ICM clinic phone appointment on 02/27/2017.    Copy of ICM check sent to Dr. Caryl Comes.   3 month ICM trend: 01/22/2017   1 Year ICM trend:      [name], RN 01/22/2017 10:07 AM

## 2017-01-23 ENCOUNTER — Ambulatory Visit (HOSPITAL_COMMUNITY): Payer: Medicare Other | Attending: Cardiology

## 2017-01-23 ENCOUNTER — Other Ambulatory Visit: Payer: Self-pay

## 2017-01-23 DIAGNOSIS — I080 Rheumatic disorders of both mitral and aortic valves: Secondary | ICD-10-CM | POA: Insufficient documentation

## 2017-01-23 DIAGNOSIS — I4891 Unspecified atrial fibrillation: Secondary | ICD-10-CM | POA: Insufficient documentation

## 2017-01-23 DIAGNOSIS — G4730 Sleep apnea, unspecified: Secondary | ICD-10-CM | POA: Diagnosis not present

## 2017-01-23 DIAGNOSIS — I428 Other cardiomyopathies: Secondary | ICD-10-CM | POA: Insufficient documentation

## 2017-01-23 DIAGNOSIS — J449 Chronic obstructive pulmonary disease, unspecified: Secondary | ICD-10-CM | POA: Diagnosis not present

## 2017-01-23 DIAGNOSIS — I10 Essential (primary) hypertension: Secondary | ICD-10-CM | POA: Diagnosis not present

## 2017-01-23 LAB — ECHOCARDIOGRAM COMPLETE
CHL CUP MV DEC (S): 148
E/e' ratio: 11.48
EWDT: 148 ms
FS: 11 % — AB (ref 28–44)
IVS/LV PW RATIO, ED: 0.73
LA ID, A-P, ES: 38 mm
LA diam end sys: 38 mm
LA diam index: 1.47 cm/m2
LA vol A4C: 93.3 ml
LA vol: 111 mL
LAVOLIN: 42.8 mL/m2
LV E/e'average: 11.48
LV PW d: 15 mm — AB (ref 0.6–1.1)
LV TDI E'LATERAL: 5.87
LV TDI E'MEDIAL: 4.13
LVEEMED: 11.48
LVELAT: 5.87 cm/s
LVOT SV: 102 mL
LVOT VTI: 20.7 cm
LVOT area: 4.91 cm2
LVOTD: 25 mm
LVOTPV: 96.1 cm/s
Lateral S' vel: 23.7 cm/s
MV pk A vel: 107 m/s
MV pk E vel: 67.4 m/s
MVAP: 5.12 cm2
P 1/2 time: 299 ms
P 1/2 time: 43 ms
TAPSE: 29.6 mm

## 2017-02-01 ENCOUNTER — Ambulatory Visit (HOSPITAL_BASED_OUTPATIENT_CLINIC_OR_DEPARTMENT_OTHER): Payer: Medicare Other

## 2017-02-01 ENCOUNTER — Encounter: Payer: Self-pay | Admitting: Internal Medicine

## 2017-02-01 ENCOUNTER — Other Ambulatory Visit (HOSPITAL_BASED_OUTPATIENT_CLINIC_OR_DEPARTMENT_OTHER): Payer: Medicare Other

## 2017-02-01 ENCOUNTER — Telehealth: Payer: Self-pay | Admitting: Internal Medicine

## 2017-02-01 ENCOUNTER — Ambulatory Visit (HOSPITAL_BASED_OUTPATIENT_CLINIC_OR_DEPARTMENT_OTHER): Payer: Medicare Other | Admitting: Internal Medicine

## 2017-02-01 VITALS — BP 119/59 | HR 81 | Temp 97.8°F | Resp 24 | Ht 73.0 in | Wt 281.2 lb

## 2017-02-01 DIAGNOSIS — C3411 Malignant neoplasm of upper lobe, right bronchus or lung: Secondary | ICD-10-CM

## 2017-02-01 DIAGNOSIS — Z5112 Encounter for antineoplastic immunotherapy: Secondary | ICD-10-CM

## 2017-02-01 DIAGNOSIS — R5383 Other fatigue: Secondary | ICD-10-CM

## 2017-02-01 DIAGNOSIS — R5382 Chronic fatigue, unspecified: Secondary | ICD-10-CM

## 2017-02-01 LAB — CBC WITH DIFFERENTIAL/PLATELET
BASO%: 0.2 % (ref 0.0–2.0)
BASOS ABS: 0 10*3/uL (ref 0.0–0.1)
EOS%: 2 % (ref 0.0–7.0)
Eosinophils Absolute: 0.1 10*3/uL (ref 0.0–0.5)
HCT: 31.2 % — ABNORMAL LOW (ref 38.4–49.9)
HEMOGLOBIN: 9.8 g/dL — AB (ref 13.0–17.1)
LYMPH#: 0.5 10*3/uL — AB (ref 0.9–3.3)
LYMPH%: 9.7 % — ABNORMAL LOW (ref 14.0–49.0)
MCH: 28.9 pg (ref 27.2–33.4)
MCHC: 31.4 g/dL — ABNORMAL LOW (ref 32.0–36.0)
MCV: 92 fL (ref 79.3–98.0)
MONO#: 0.9 10*3/uL (ref 0.1–0.9)
MONO%: 17.9 % — ABNORMAL HIGH (ref 0.0–14.0)
NEUT#: 3.5 10*3/uL (ref 1.5–6.5)
NEUT%: 70.2 % (ref 39.0–75.0)
NRBC: 0 % (ref 0–0)
Platelets: 158 10*3/uL (ref 140–400)
RBC: 3.39 10*6/uL — ABNORMAL LOW (ref 4.20–5.82)
RDW: 16.6 % — AB (ref 11.0–14.6)
WBC: 5 10*3/uL (ref 4.0–10.3)

## 2017-02-01 LAB — COMPREHENSIVE METABOLIC PANEL
ALBUMIN: 3 g/dL — AB (ref 3.5–5.0)
ALT: 16 U/L (ref 0–55)
AST: 20 U/L (ref 5–34)
Alkaline Phosphatase: 118 U/L (ref 40–150)
Anion Gap: 7 mEq/L (ref 3–11)
BUN: 28.3 mg/dL — AB (ref 7.0–26.0)
CHLORIDE: 109 meq/L (ref 98–109)
CO2: 25 meq/L (ref 22–29)
CREATININE: 1.4 mg/dL — AB (ref 0.7–1.3)
Calcium: 9.3 mg/dL (ref 8.4–10.4)
EGFR: >60 mL/min/{1.73_m2} (ref >60)
Glucose: 102 mg/dl (ref 70–140)
Potassium: 4 mEq/L (ref 3.5–5.1)
SODIUM: 141 meq/L (ref 136–145)
TOTAL PROTEIN: 7 g/dL (ref 6.4–8.3)
Total Bilirubin: 0.36 mg/dL (ref 0.20–1.20)

## 2017-02-01 LAB — TSH: TSH: 1.385 m[IU]/L (ref 0.320–4.118)

## 2017-02-01 MED ORDER — SODIUM CHLORIDE 0.9 % IV SOLN
1240.0000 mg | Freq: Once | INTRAVENOUS | Status: AC
  Administered 2017-02-01: 1240 mg via INTRAVENOUS
  Filled 2017-02-01: qty 4.8

## 2017-02-01 MED ORDER — SODIUM CHLORIDE 0.9% FLUSH
10.0000 mL | INTRAVENOUS | Status: DC | PRN
  Administered 2017-02-01: 10 mL
  Filled 2017-02-01: qty 10

## 2017-02-01 MED ORDER — SODIUM CHLORIDE 0.9 % IV SOLN
Freq: Once | INTRAVENOUS | Status: AC
  Administered 2017-02-01: 13:00:00 via INTRAVENOUS

## 2017-02-01 MED ORDER — HEPARIN SOD (PORK) LOCK FLUSH 100 UNIT/ML IV SOLN
500.0000 [IU] | Freq: Once | INTRAVENOUS | Status: AC | PRN
  Administered 2017-02-01: 500 [IU]
  Filled 2017-02-01: qty 5

## 2017-02-01 NOTE — Patient Instructions (Signed)
[facility] Discharge Instructions for Patients Receiving Chemotherapy  Today you received the following chemotherapy agents: Durvalumab   To help prevent nausea and vomiting after your treatment, we encourage you to take your nausea medication as directed.    If you develop nausea and vomiting that is not controlled by your nausea medication, call the clinic.   BELOW ARE SYMPTOMS THAT SHOULD BE REPORTED IMMEDIATELY:  *FEVER GREATER THAN 100.5 F  *CHILLS WITH OR WITHOUT FEVER  NAUSEA AND VOMITING THAT IS NOT CONTROLLED WITH YOUR NAUSEA MEDICATION  *UNUSUAL SHORTNESS OF BREATH  *UNUSUAL BRUISING OR BLEEDING  TENDERNESS IN MOUTH AND THROAT WITH OR WITHOUT PRESENCE OF ULCERS  *URINARY PROBLEMS  *BOWEL PROBLEMS  UNUSUAL RASH Items with * indicate a potential emergency and should be followed up as soon as possible.  Feel free to call the clinic you have any questions or concerns. The clinic phone number is [phone].  Please show the [facility] at check-in to the Emergency Department and triage nurse.

## 2017-02-01 NOTE — Telephone Encounter (Signed)
Scheduled appt per 1/11 los - Gave patient AVS and calender per los.  

## 2017-02-01 NOTE — Progress Notes (Signed)
[place] [phone]   [phone]) [phone]  OFFICE PROGRESS NOTE  Lucianne Lei, MD [address] [place] [address]  DIAGNOSIS: Stage IIIA (T1a, N2, M0) non-small cell lung cancer, adenocarcinoma presented with right upper lobe lung nodule in addition to mediastinal lymphadenopathy diagnosed in March 2018.  Biomarker Findings Tumor Mutational Burden - TMB-Intermediate (8 Muts/Mb) Microsatellite Status - MS-Stable Genomic Findings For a complete list of the genes assayed, please refer to the Appendix. ERBB2 amplification - equivocal? DNMT3A K459f*192 FUBP1 Q40* KEAP1 [id number] TP53 C275F 7 Disease relevant genes with no reportable alterations: EGFR, KRAS, ALK, BRAF, MET, RET, ROS1   PRIOR THERAPY: course of concurrent chemoradiation with weekly carboplatin for AUC of 2 and paclitaxel 45 MG/M2. Status post 6 cycles. Last cycle was given 10/16/2016.  CURRENT THERAPY:  Consolidation treatment with immunotherapy with Imfinzi (Durvalumab) 10 MG/M2 every 2 weeks. First dose 12/21/2016.  Status post 2 cycles.  INTERVAL HISTORY: Jason Swayne89.o. male returns to the clinic today for follow-up visit.  The patient tolerated the second cycle of his treatment fairly well with no significant adverse effects.  He denied having any nausea, vomiting, diarrhea or constipation.  He denied having any weight loss or night sweats.  He has no fever or chills.  He denied having any significant chest pain, shortness breath, cough or hemoptysis.  He is here today for evaluation before starting cycle #3.  MEDICAL HISTORY: Past Medical History:  Diagnosis Date  . Adenocarcinoma of right lung, stage 3 (HC[facility]5/23/2018  . Adenocarcinoma of right lung, stage 3 (HCCaney5/23/2018  . Anemia   . Arthritis    "hx right hip"  . Asthma    "when I was a child"  . Atrial fibrillation (HCC)    Amiodarone started 10/2011; Coumadin  . Automatic implantable  cardioverter-defibrillator in situ 10/03/2012   a. St. Jude ICD implantation 10/03/12.  . Chronic anticoagulation   . Chronic fatigue 10/18/2016  . Chronic fatigue 10/18/2016  . Chronic systolic heart failure (HCNicollet   a. Echo 7/13: EF 25%;  b. echo 04/2012:  Mild LVH, EF 30-35%, Gr 1 DD, mild AI, mild MR, mild LAE  . COPD (chronic obstructive pulmonary disease) (HCMullan  . Dyslipidemia   . Dysrhythmia   . Encounter for antineoplastic chemotherapy 08/24/2016  . Gout   . History of blood transfusion 10/15/2013   "don't know where the blood's going; HgB down to 5"  . Hyperlipidemia   . Hypertension   . Hypothyroidism   . NICM (nonischemic cardiomyopathy) (HC[facility]   [facility]/14:  minimal CAD  . Obesity   . OSA on CPAP   . Tobacco abuse     ALLERGIES:  has No Known Allergies.  MEDICATIONS:  Current Outpatient Prescriptions  Medication Sig Dispense Refill  . albuterol (PROVENTIL HFA;VENTOLIN HFA) 108 (90 Base) MCG/ACT inhaler Inhale 2 puffs into the lungs every 4 (four) hours as needed for wheezing or shortness of breath. 1 Inhaler 2  . amiodarone (PACERONE) 100 MG tablet TAKE 1 TABLET BY MOUTH DAILY 90 tablet 1  . atorvastatin (LIPITOR) 20 MG tablet TAKE 1 TABLET BY MOUTH EVERY DAY 90 tablet 3  . carvedilol (COREG) 25 MG tablet TAKE 1 TABLET BY MOUTH TWICE A DAY WITH A MEAL 60 tablet 11  . [facility] D3 2000 units CAPS Take 2,000 Units by mouth daily.   11  . feeding supplement, ENSURE ENLIVE, (ENSURE ENLIVE) LIQD  Take 237 mLs by mouth 2 (two) times daily between meals. 237 mL 12  . ferrous gluconate (FERGON) 324 MG tablet Take 1 tablet (324 mg total) by mouth 3 (three) times daily with meals. 90 tablet 8  . Fluticasone-Umeclidin-Vilant (TRELEGY ELLIPTA) 100-62.5-25 MCG/INH AEPB Inhale 1 puff into the lungs daily. 1 each 6  . furosemide (LASIX) 20 MG tablet Take 3 tabs (60 mg) every Tue and Sat, all other days take 2 tabs (40 mg) (Patient taking differently: Take 40-60 mg by mouth See admin instructions.  Take 3 tabs (60 mg) every Tue and Sat, all other days take 2 tabs (40 mg)) 64 tablet 6  . hydrALAZINE (APRESOLINE) 100 MG tablet Take 100 mg by mouth 3 (three) times daily.   1  . levothyroxine (SYNTHROID, LEVOTHROID) 50 MCG tablet TAKE 1 TABLET (50 MCG TOTAL) BY MOUTH DAILY BEFORE BREAKFAST. 90 tablet 3  . lidocaine-prilocaine (EMLA) cream Apply generous amount to port site at least 1 hr prior to treatment.  DO NOT RUB IN. 30 g 1  . lisinopril (PRINIVIL,ZESTRIL) 40 MG tablet TAKE 1 TABLET BY MOUTH DAILY. 90 tablet 2  . magnesium oxide (MAG-OX) 400 (241.3 Mg) MG tablet Take 1 tablet (400 mg total) by mouth daily. 30 tablet 0  . potassium chloride SA (KLOR-CON M20) 20 MEQ tablet Take 2 tablets (40 mEq total) by mouth daily. 60 tablet 0  . prochlorperazine (COMPAZINE) 10 MG tablet Take 1 tablet (10 mg total) by mouth every 6 (six) hours as needed for nausea or vomiting. 30 tablet 0  . rivaroxaban (XARELTO) 20 MG TABS tablet Take 1 tablet (20 mg total) by mouth daily. 90 tablet 2  . ULORIC 40 MG tablet Take 1 tablet (40 mg total) by mouth daily. 30 tablet 0  . acetaminophen (TYLENOL) 500 MG tablet Take 1,000 mg by mouth every 6 (six) hours as needed for mild pain.    [name] buPROPion (WELLBUTRIN SR) 150 MG 12 hr tablet TAKE 1 TABLET (150 MG TOTAL) BY MOUTH 2 (TWO) TIMES DAILY. (Patient not taking: Reported on 01/18/2017) 180 tablet 2  . spironolactone (ALDACTONE) 25 MG tablet TAKE 0.5 TABLETS (12.5 MG TOTAL) BY MOUTH DAILY. 45 tablet 2   No current facility-administered medications for this visit.     SURGICAL HISTORY:  Past Surgical History:  Procedure Laterality Date  . CARDIAC DEFIBRILLATOR PLACEMENT  2014  . CARDIOVERSION  2011  . COLONOSCOPY WITH PROPOFOL Left 10/17/2013   Procedure: COLONOSCOPY WITH PROPOFOL;  Surgeon: Inda Castle, MD;  Location: [facility];  Service: Endoscopy;  Laterality: Left;  . ESOPHAGOGASTRODUODENOSCOPY N/A 10/17/2013   Procedure: ESOPHAGOGASTRODUODENOSCOPY (EGD);   Surgeon: Inda Castle, MD;  Location: [facility];  Service: Endoscopy;  Laterality: N/A;  . GIVENS CAPSULE STUDY N/A 10/29/2013   Procedure: GIVENS CAPSULE STUDY;  Surgeon: Inda Castle, MD;  Location: [facility];  Service: Endoscopy;  Laterality: N/A;  . IMPLANTABLE CARDIOVERTER DEFIBRILLATOR IMPLANT Left 10/03/2012   Procedure: IMPLANTABLE CARDIOVERTER DEFIBRILLATOR IMPLANT;  Surgeon: Deboraha Sprang, MD;  [facility];  Service: Cardiovascular;  Laterality: Left;  . IR FLUORO GUIDE PORT INSERTION RIGHT  09/21/2016  . IR US GUIDE VASC ACCESS RIGHT  09/21/2016  . JOINT REPLACEMENT    . TONSILLECTOMY  1950's  . TOTAL HIP ARTHROPLASTY Right 11/25/1997  . VIDEO BRONCHOSCOPY WITH ENDOBRONCHIAL ULTRASOUND N/A 08/09/2016   Procedure: VIDEO BRONCHOSCOPY WITH ENDOBRONCHIAL ULTRASOUND;  Surgeon: Javier Glazier, MD;  Location: [facility];  Service: Thoracic;  Laterality: N/A;    REVIEW OF SYSTEMS:  A comprehensive review of systems was negative.   PHYSICAL EXAMINATION: General appearance: alert, cooperative and no distress Head: Normocephalic, without obvious abnormality, atraumatic Neck: no adenopathy, no JVD, supple, symmetrical, trachea midline and thyroid not enlarged, symmetric, no tenderness/mass/nodules Lymph nodes: Cervical, supraclavicular, and axillary nodes normal. Resp: clear to auscultation bilaterally Back: symmetric, no curvature. ROM normal. No CVA tenderness. Cardio: regular rate and rhythm, S1, S2 normal, no murmur, click, rub or gallop GI: soft, non-tender; bowel sounds normal; no masses,  no organomegaly Extremities: extremities normal, atraumatic, no cyanosis or edema  ECOG PERFORMANCE STATUS: 1 - Symptomatic but completely ambulatory  Blood pressure (!) 119/59, pulse 81, temperature 97.8 F (36.6 C), temperature source Oral, resp. rate (!) 24, height _0  (1.854 m), weight 281 lb 3.2 oz (127.6 kg), SpO2 99 %.  LABORATORY DATA: Lab Results  Component Value Date     WBC 5.0 02/01/2017   HGB 9.8 (L) 02/01/2017   HCT 31.2 (L) 02/01/2017   MCV 92.0 02/01/2017   PLT 158 02/01/2017      Chemistry      Component Value Date/Time   NA 141 02/01/2017 1019   K 4.0 02/01/2017 1019   CL 104 01/15/2017 0854   CO2 25 02/01/2017 1019   BUN 28.3 (H) 02/01/2017 1019   CREATININE 1.4 (H) 02/01/2017 1019      Component Value Date/Time   CALCIUM 9.3 02/01/2017 1019   ALKPHOS 118 02/01/2017 1019   AST 20 02/01/2017 1019   ALT 16 02/01/2017 1019   BILITOT 0.36 02/01/2017 1019       RADIOGRAPHIC STUDIES: Dg Chest 2 View  Result Date: 01/07/2017 CLINICAL DATA:  Dyspnea and fatigue for several days. Defibrillator fired. EXAM: CHEST  2 VIEW COMPARISON:  11/20/2016, 09/06/2016 FINDINGS: Right jugular port appears satisfactorily positioned. Transvenous cardiac leads appear grossly intact. No airspace consolidation. No effusions. Normal pulmonary vasculature. Hilar and mediastinal contours are unremarkable and unchanged. IMPRESSION: No acute cardiopulmonary findings. Electronically Signed   By: Andreas Newport M.D.   On: 01/07/2017 21:43   Ct Chest Wo Contrast  Result Date: 01/08/2017 CLINICAL DATA:  Non-small cell lung cancer.  Defibrillator firing. EXAM: CT CHEST WITHOUT CONTRAST TECHNIQUE: Multidetector CT imaging of the chest was performed following the standard protocol without IV contrast. COMPARISON:  Chest CT 11/20/2016 FINDINGS: Cardiovascular: The heart is enlarged. No pericardial effusion. Mild aortic arch atherosclerotic calcification. Single lead left chest wall cardioverter with lead tip in the right ventricle. Right chest wall Port-A-Cath terminates in the lower SVC. Mediastinum/Nodes: No mediastinal, hilar or axillary lymphadenopathy. The visualized thyroid and thoracic esophageal course are unremarkable. Lungs/Pleura: Nodular opacity in the right lung apex has increased in size, measuring 17 x 11 mm, previously 8 mm. It is less dense, however. There  are scattered other right upper lobe opacities. Anterior paramediastinal opacities within the right upper lobe are new from the prior CT the may indicate atelectasis or scarring/ fibrosis. Upper Abdomen: Nonobstructive right renal calculus measuring up to 6 mm. Musculoskeletal: No chest wall mass or suspicious bone lesions identified. IMPRESSION: 1. No acute thoracic abnormality. 2. Decreased density of nodular opacity of the right lung apex, but slightly increased size of amid the appearance of other adjacent intermediate density opacities. New anterior paramediastinal opacities in the right upper lobe. Findings are favored to represent post treatment effect, particularly in the context of relatively recent radiation therapy. Infectious etiologies are a secondary consideration. While progression of  disease seems less likely, it would be difficult to exclude entirely. 3.  Aortic Atherosclerosis (ICD10-I70.0). Electronically Signed   By: Ulyses Jarred M.D.   On: 01/08/2017 02:30    ASSESSMENT AND PLAN:  This is a very pleasant 66 years old African-American male with a stage IIIa non-small cell lung cancer, adenocarcinoma.  The patient completed 6 weeks of concurrent chemoradiation with weekly carboplatin and paclitaxel and tolerated his treatment well except for odynophagia. The patient is currently on consolidation treatment with immunotherapy with Imfinzi (Durvalumab) status post 2 cycles.  He tolerated the second cycle fairly well. I recommended for him to proceed with cycle #3 today as a schedule. I will see him back for follow-up visit in 2 weeks for evaluation before starting cycle #4. The patient was advised to call immediately if he has any concerning symptoms in the interval. The patient voices understanding of current disease status and treatment options and is in agreement with the current care plan. All questions were answered. The patient knows to call the clinic with any problems, questions  or concerns. We can certainly see the patient much sooner if necessary. I spent 10 minutes counseling the patient face to face. The total time spent in the appointment was 15 minutes.  Disclaimer: This note was dictated with voice recognition software. Similar sounding words can inadvertently be transcribed and may not be corrected upon review.

## 2017-02-02 ENCOUNTER — Telehealth: Payer: Self-pay | Admitting: Radiation Oncology

## 2017-02-02 NOTE — Telephone Encounter (Signed)
Received refill authorization request for Carafate. Faxed response back to [facility] at [phone] not authorizing refill because patient is no longer receiving radiation therapy. Fax confirmation of delivery obtained.

## 2017-02-05 ENCOUNTER — Other Ambulatory Visit: Payer: Self-pay | Admitting: Radiation Oncology

## 2017-02-05 DIAGNOSIS — C3411 Malignant neoplasm of upper lobe, right bronchus or lung: Secondary | ICD-10-CM

## 2017-02-05 DIAGNOSIS — K209 Esophagitis, unspecified without bleeding: Secondary | ICD-10-CM

## 2017-02-13 ENCOUNTER — Encounter: Payer: Self-pay | Admitting: Internal Medicine

## 2017-02-15 ENCOUNTER — Encounter: Payer: Self-pay | Admitting: Oncology

## 2017-02-15 ENCOUNTER — Ambulatory Visit (HOSPITAL_BASED_OUTPATIENT_CLINIC_OR_DEPARTMENT_OTHER): Payer: Medicare Other

## 2017-02-15 ENCOUNTER — Other Ambulatory Visit (HOSPITAL_BASED_OUTPATIENT_CLINIC_OR_DEPARTMENT_OTHER): Payer: Medicare Other

## 2017-02-15 ENCOUNTER — Encounter: Payer: Self-pay | Admitting: *Deleted

## 2017-02-15 ENCOUNTER — Ambulatory Visit (HOSPITAL_BASED_OUTPATIENT_CLINIC_OR_DEPARTMENT_OTHER): Payer: Medicare Other | Admitting: Oncology

## 2017-02-15 VITALS — BP 148/67 | HR 75 | Temp 97.6°F | Resp 19 | Ht 73.0 in | Wt 285.8 lb

## 2017-02-15 DIAGNOSIS — C3411 Malignant neoplasm of upper lobe, right bronchus or lung: Secondary | ICD-10-CM

## 2017-02-15 DIAGNOSIS — R5383 Other fatigue: Secondary | ICD-10-CM

## 2017-02-15 DIAGNOSIS — Z5112 Encounter for antineoplastic immunotherapy: Secondary | ICD-10-CM

## 2017-02-15 LAB — CBC WITH DIFFERENTIAL/PLATELET
BASO%: 0.2 % (ref 0.0–2.0)
Basophils Absolute: 0 10*3/uL (ref 0.0–0.1)
EOS ABS: 0.1 10*3/uL (ref 0.0–0.5)
EOS%: 2.1 % (ref 0.0–7.0)
HCT: 34.4 % — ABNORMAL LOW (ref 38.4–49.9)
HEMOGLOBIN: 10.7 g/dL — AB (ref 13.0–17.1)
LYMPH%: 13.6 % — ABNORMAL LOW (ref 14.0–49.0)
MCH: 28.8 pg (ref 27.2–33.4)
MCHC: 31.1 g/dL — ABNORMAL LOW (ref 32.0–36.0)
MCV: 92.7 fL (ref 79.3–98.0)
MONO#: 0.5 10*3/uL (ref 0.1–0.9)
MONO%: 12.6 % (ref 0.0–14.0)
NEUT%: 71.5 % (ref 39.0–75.0)
NEUTROS ABS: 3.1 10*3/uL (ref 1.5–6.5)
PLATELETS: 142 10*3/uL (ref 140–400)
RBC: 3.71 10*6/uL — ABNORMAL LOW (ref 4.20–5.82)
RDW: 16.4 % — ABNORMAL HIGH (ref 11.0–14.6)
WBC: 4.3 10*3/uL (ref 4.0–10.3)
lymph#: 0.6 10*3/uL — ABNORMAL LOW (ref 0.9–3.3)

## 2017-02-15 LAB — COMPREHENSIVE METABOLIC PANEL
ALBUMIN: 3.2 g/dL — AB (ref 3.5–5.0)
ALK PHOS: 123 U/L (ref 40–150)
ALT: 14 U/L (ref 0–55)
ANION GAP: 7 meq/L (ref 3–11)
AST: 18 U/L (ref 5–34)
BILIRUBIN TOTAL: 0.31 mg/dL (ref 0.20–1.20)
BUN: 20.4 mg/dL (ref 7.0–26.0)
CALCIUM: 9.4 mg/dL (ref 8.4–10.4)
CO2: 27 mEq/L (ref 22–29)
Chloride: 107 mEq/L (ref 98–109)
Creatinine: 1.6 mg/dL — ABNORMAL HIGH (ref 0.7–1.3)
EGFR: 54 mL/min/{1.73_m2} — AB (ref >60)
Glucose: 148 mg/dl — ABNORMAL HIGH (ref 70–140)
Potassium: 3.9 mEq/L (ref 3.5–5.1)
Sodium: 142 mEq/L (ref 136–145)
TOTAL PROTEIN: 7.2 g/dL (ref 6.4–8.3)

## 2017-02-15 LAB — TSH: TSH: 1.247 m[IU]/L (ref 0.320–4.118)

## 2017-02-15 MED ORDER — SODIUM CHLORIDE 0.9% FLUSH
10.0000 mL | INTRAVENOUS | Status: DC | PRN
  Administered 2017-02-15: 10 mL
  Filled 2017-02-15: qty 10

## 2017-02-15 MED ORDER — SODIUM CHLORIDE 0.9 % IV SOLN
Freq: Once | INTRAVENOUS | Status: AC
  Administered 2017-02-15: 13:00:00 via INTRAVENOUS

## 2017-02-15 MED ORDER — DURVALUMAB 500 MG/10ML IV SOLN
9.5000 mg/kg | Freq: Once | INTRAVENOUS | Status: AC
  Administered 2017-02-15: 1240 mg via INTRAVENOUS
  Filled 2017-02-15: qty 20

## 2017-02-15 MED ORDER — HEPARIN SOD (PORK) LOCK FLUSH 100 UNIT/ML IV SOLN
500.0000 [IU] | Freq: Once | INTRAVENOUS | Status: AC | PRN
  Administered 2017-02-15: 500 [IU]
  Filled 2017-02-15: qty 5

## 2017-02-15 NOTE — Progress Notes (Signed)
[facility] OFFICE PROGRESS NOTE  Jason Lei, MD [address] [place] [address]  DIAGNOSIS: Stage IIIA (T1a, N2, M0) non-small cell lung cancer, adenocarcinoma presented with right upper lobe lung nodule in addition to mediastinal lymphadenopathy diagnosed in March 2018.  Biomarker Findings Tumor Mutational Burden - TMB-Intermediate (8 Muts/Mb) Microsatellite Status - MS-Stable Genomic Findings For a complete list of the genes assayed, please refer to the Appendix. ERBB2 amplification - equivocal? DNMT3A K464f*192 FUBP1 Q40* KEAP1 [id number] TP53 C275F 7 Disease relevant genes with no reportable alterations: EGFR, KRAS, ALK, BRAF, MET, RET, ROS1  PRIOR THERAPY: course of concurrent chemoradiation with weekly carboplatin for AUC of 2 and paclitaxel 45 MG/M2. Status post 6 cycles. Last cycle was given 10/16/2016.   CURRENT THERAPY: Consolidation treatment with immunotherapy with Imfinzi (Durvalumab) 10 MG/M2 every 2 weeks. First dose 12/21/2016.  Status post 3 cycles.  INTERVAL HISTORY: Jason Bingman66.o. male returns for a routine follow-up visit by himself.  The patient tolerated his last cycle of treatment well with no significant adverse effects.  Patient denies fevers and chills.  Denies chest pain, shortness breath, cough, hemoptysis.  Denies nausea, vomiting, constipation, diarrhea.  The patient is here for evaluation prior to cycle 4.  MEDICAL HISTORY: Past Medical History:  Diagnosis Date  . Adenocarcinoma of right lung, stage 3 (HCPorcupine5/23/2018  . Adenocarcinoma of right lung, stage 3 (HCDublin5/23/2018  . Anemia   . Arthritis    "hx right hip"  . Asthma    "when I was a child"  . Atrial fibrillation (HCC)    Amiodarone started 10/2011; Coumadin  . 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 St. Jude ICD implantation 10/03/12.  . Chronic anticoagulation   . Chronic fatigue 10/18/2016  . Chronic fatigue  10/18/2016  . Chronic systolic heart failure (HCEllicott   a. Echo 7/13: EF 25%;  b. echo 04/2012:  Mild LVH, EF 30-35%, Gr 1 DD, mild AI, mild MR, mild LAE  . COPD (chronic obstructive pulmonary disease) (HCVinton  . Dyslipidemia   . Dysrhythmia   . Encounter for antineoplastic chemotherapy 08/24/2016  . Gout   . History of blood transfusion 10/15/2013   "don't know where the blood's going; HgB down to 5"  . Hyperlipidemia   . Hypertension   . Hypothyroidism   . NICM (nonischemic cardiomyopathy) (HCMonroeville   [place]/14:  minimal CAD  . Obesity   . OSA on CPAP   . Tobacco abuse     ALLERGIES:  has No Known Allergies.  MEDICATIONS:  Current Outpatient Medications  Medication Sig Dispense Refill  . acetaminophen (TYLENOL) 500 MG tablet Take 1,000 mg by mouth every 6 (six) hours as needed for mild pain.    . Marland Kitchenlbuterol (PROVENTIL HFA;VENTOLIN HFA) 108 (90 Base) MCG/ACT inhaler Inhale 2 puffs into the lungs every 4 (four) hours as needed for wheezing or shortness of breath. 1 Inhaler 2  . amiodarone (PACERONE) 100 MG tablet TAKE 1 TABLET BY MOUTH DAILY 90 tablet 1  . atorvastatin (LIPITOR) 20 MG tablet TAKE 1 TABLET BY MOUTH EVERY DAY 90 tablet 3  . buPROPion (WELLBUTRIN SR) 150 MG 12 hr tablet TAKE 1 TABLET (150 MG TOTAL) BY MOUTH 2 (TWO) TIMES DAILY. (Patient not taking: Reported on 01/18/2017) 180 tablet 2  . carvedilol (COREG) 25 MG tablet TAKE 1 TABLET BY MOUTH TWICE A DAY WITH A MEAL 60 tablet 11  . [facility] D3 2000  units CAPS Take 2,000 Units by mouth daily.   11  . feeding supplement, ENSURE ENLIVE, (ENSURE ENLIVE) LIQD Take 237 mLs by mouth 2 (two) times daily between meals. 237 mL 12  . ferrous gluconate (FERGON) 324 MG tablet Take 1 tablet (324 mg total) by mouth 3 (three) times daily with meals. 90 tablet 8  . Fluticasone-Umeclidin-Vilant (TRELEGY ELLIPTA) 100-62.5-25 MCG/INH AEPB Inhale 1 puff into the lungs daily. 1 each 6  . furosemide (LASIX) 20 MG tablet Take 3 tabs (60 mg) every Tue and Sat,  all other days take 2 tabs (40 mg) (Patient taking differently: Take 40-60 mg by mouth See admin instructions. Take 3 tabs (60 mg) every Tue and Sat, all other days take 2 tabs (40 mg)) 64 tablet 6  . hydrALAZINE (APRESOLINE) 100 MG tablet Take 100 mg by mouth 3 (three) times daily.   1  . levothyroxine (SYNTHROID, LEVOTHROID) 50 MCG tablet TAKE 1 TABLET (50 MCG TOTAL) BY MOUTH DAILY BEFORE BREAKFAST. 90 tablet 3  . lidocaine-prilocaine (EMLA) cream Apply generous amount to port site at least 1 hr prior to treatment.  DO NOT RUB IN. 30 g 1  . lisinopril (PRINIVIL,ZESTRIL) 40 MG tablet TAKE 1 TABLET BY MOUTH DAILY. 90 tablet 2  . magnesium oxide (MAG-OX) 400 (241.3 Mg) MG tablet Take 1 tablet (400 mg total) by mouth daily. 30 tablet 0  . potassium chloride SA (KLOR-CON M20) 20 MEQ tablet Take 2 tablets (40 mEq total) by mouth daily. 60 tablet 0  . prochlorperazine (COMPAZINE) 10 MG tablet Take 1 tablet (10 mg total) by mouth every 6 (six) hours as needed for nausea or vomiting. 30 tablet 0  . rivaroxaban (XARELTO) 20 MG TABS tablet Take 1 tablet (20 mg total) by mouth daily. 90 tablet 2  . spironolactone (ALDACTONE) 25 MG tablet TAKE 0.5 TABLETS (12.5 MG TOTAL) BY MOUTH DAILY. 45 tablet 2  . sucralfate (CARAFATE) 1 g tablet TAKE 1 TABLET BY MOUTH FOUR TIMES DAILY (5 MINUTES BEFORE MEALS AND AT BEDTIME) FOR ESOPHAGITIS 120 tablet 2  . ULORIC 40 MG tablet Take 1 tablet (40 mg total) by mouth daily. 30 tablet 0   No current facility-administered medications for this visit.     SURGICAL HISTORY:  Past Surgical History:  Procedure Laterality Date  . CARDIAC DEFIBRILLATOR PLACEMENT  2014  . CARDIOVERSION  2011  . COLONOSCOPY WITH PROPOFOL Left 10/17/2013   Procedure: COLONOSCOPY WITH PROPOFOL;  Surgeon: Inda Castle, MD;  Location: [facility];  Service: Endoscopy;  Laterality: Left;  . ESOPHAGOGASTRODUODENOSCOPY N/A 10/17/2013   Procedure: ESOPHAGOGASTRODUODENOSCOPY (EGD);  Surgeon: Inda Castle, MD;  Location: [facility];  Service: Endoscopy;  Laterality: N/A;  . GIVENS CAPSULE STUDY N/A 10/29/2013   Procedure: GIVENS CAPSULE STUDY;  Surgeon: Inda Castle, MD;  Location: [facility];  Service: Endoscopy;  Laterality: N/A;  . IMPLANTABLE CARDIOVERTER DEFIBRILLATOR IMPLANT Left 10/03/2012   Procedure: IMPLANTABLE CARDIOVERTER DEFIBRILLATOR IMPLANT;  Surgeon: Deboraha Sprang, MD;  Location[facility] CATH LAB;  Service: Cardiovascular;  Laterality: Left;  . IR FLUORO GUIDE PORT INSERTION RIGHT  09/21/2016  . IR US GUIDE VASC ACCESS RIGHT  09/21/2016  . JOINT REPLACEMENT    . TONSILLECTOMY  1950's  . TOTAL HIP ARTHROPLASTY Right 11/25/1997  . VIDEO BRONCHOSCOPY WITH ENDOBRONCHIAL ULTRASOUND N/A 08/09/2016   Procedure: VIDEO BRONCHOSCOPY WITH ENDOBRONCHIAL ULTRASOUND;  Surgeon: Javier Glazier, MD;  [facility]: [facility] OR;  Service: Thoracic;  Laterality: N/A;    REVIEW OF  SYSTEMS:   Review of Systems  Constitutional: Negative for appetite change, chills, fatigue, fever and unexpected weight change.  HENT:   Negative for mouth sores, nosebleeds, sore throat and trouble swallowing.   Eyes: Negative for eye problems and icterus.  Respiratory: Negative for cough, hemoptysis, shortness of breath and wheezing.   Cardiovascular: Negative for chest pain and leg swelling.  Gastrointestinal: Negative for abdominal pain, constipation, diarrhea, nausea and vomiting.  Genitourinary: Negative for bladder incontinence, difficulty urinating, dysuria, frequency and hematuria.   Musculoskeletal: Negative for back pain, gait problem, neck pain and neck stiffness.  Skin: Negative for itching and rash.  Neurological: Negative for dizziness, extremity weakness, gait problem, headaches, light-headedness and seizures.  Hematological: Negative for adenopathy. Does not bruise/bleed easily.  Psychiatric/Behavioral: Negative for confusion, depression and sleep disturbance. The patient is not nervous/anxious.      PHYSICAL EXAMINATION:  Blood pressure (!) 148/67, pulse 75, temperature 97.6 F (36.4 C), temperature source Oral, resp. rate 19, height _0  (1.854 m), weight 285 lb 12.8 oz (129.6 kg), SpO2 100 %.  ECOG PERFORMANCE STATUS: 1 - Symptomatic but completely ambulatory  Physical Exam  Constitutional: Oriented to person, place, and time and well-developed, well-nourished, and in no distress. No distress.  HENT:  Head: Normocephalic and atraumatic.  Mouth/Throat: Oropharynx is clear and moist. No oropharyngeal exudate.  Eyes: Conjunctivae are normal. Right eye exhibits no discharge. Left eye exhibits no discharge. No scleral icterus.  Neck: Normal range of motion. Neck supple.  Cardiovascular: Normal rate, regular rhythm, normal heart sounds and intact distal pulses.   Pulmonary/Chest: Effort normal and breath sounds normal. No respiratory distress. No wheezes. No rales.  Abdominal: Soft. Bowel sounds are normal. Exhibits no distension and no mass. There is no tenderness.  Musculoskeletal: Normal range of motion. Exhibits no edema.  Lymphadenopathy:    No cervical adenopathy.  Neurological: Alert and oriented to person, place, and time. Exhibits normal muscle tone. Gait normal. Coordination normal.  Skin: Skin is warm and dry. No rash noted. Not diaphoretic. No erythema. No pallor.  Psychiatric: Mood, memory and judgment normal.  Vitals reviewed.  LABORATORY DATA: Lab Results  Component Value Date   WBC 4.3 02/15/2017   HGB 10.7 (L) 02/15/2017   HCT 34.4 (L) 02/15/2017   MCV 92.7 02/15/2017   PLT 142 02/15/2017      Chemistry      Component Value Date/Time   NA 142 02/15/2017 1033   K 3.9 02/15/2017 1033   CL 104 01/15/2017 0854   CO2 27 02/15/2017 1033   BUN 20.4 02/15/2017 1033   CREATININE 1.6 (H) 02/15/2017 1033      Component Value Date/Time   CALCIUM 9.4 02/15/2017 1033   ALKPHOS 123 02/15/2017 1033   AST 18 02/15/2017 1033   ALT 14 02/15/2017 1033   BILITOT  0.31 02/15/2017 1033       RADIOGRAPHIC STUDIES:  No results found.   ASSESSMENT/PLAN:  Primary cancer of right upper lobe of lung ([place]) This is a very pleasant 66 year old African-American male with a stage IIIa non-small cell lung cancer, adenocarcinoma.  The patient completed 6 weeks of concurrent chemoradiation with weekly carboplatin and paclitaxel and tolerated his treatment well except for odynophagia. The patient is currently on consolidation treatment with immunotherapy with Imfinzi (Durvalumab) status post 3 cycles.  He tolerated the second cycle fairly well. I recommended for him to proceed with cycle #4 today as a schedule.  I will see him back for follow-up  visit in 2 weeks for evaluation before starting cycle #5.  Creatinine is up slightly to 1.6.  Okay to treat with Imfinzi today as scheduled.  Will monitor creatinine every 2 weeks with his treatment.  The patient was advised to call immediately if he has any concerning symptoms in the interval. The patient voices understanding of current disease status and treatment options and is in agreement with the current care plan. All questions were answered. The patient knows to call the clinic with any problems, questions or concerns. We can certainly see the patient much sooner if necessary.  No orders of the defined types were placed in this encounter.   Mikey Bussing, DNP, AGPCNP-BC, AOCNP 02/15/17

## 2017-02-15 NOTE — Assessment & Plan Note (Signed)
This is a very pleasant 66 year old African-American male with a stage IIIa non-small cell lung cancer, adenocarcinoma.  The patient completed 6 weeks of concurrent chemoradiation with weekly carboplatin and paclitaxel and tolerated his treatment well except for odynophagia. The patient is currently on consolidation treatment with immunotherapy with Imfinzi (Durvalumab) status post 3 cycles.  He tolerated the second cycle fairly well. I recommended for him to proceed with cycle #4 today as a schedule.  I will see him back for follow-up visit in 2 weeks for evaluation before starting cycle #5.  Creatinine is up slightly to 1.6.  Okay to treat with Imfinzi today as scheduled.  Will monitor creatinine every 2 weeks with his treatment.  The patient was advised to call immediately if he has any concerning symptoms in the interval. The patient voices understanding of current disease status and treatment options and is in agreement with the current care plan. All questions were answered. The patient knows to call the clinic with any problems, questions or concerns. We can certainly see the patient much sooner if necessary.

## 2017-02-15 NOTE — Patient Instructions (Signed)
[facility] Discharge Instructions for Patients Receiving Chemotherapy  Today you received the following chemotherapy agents: Imfinzi.  To help prevent nausea and vomiting after your treatment, we encourage you to take your nausea medication as directed.   If you develop nausea and vomiting that is not controlled by your nausea medication, call the clinic.   BELOW ARE SYMPTOMS THAT SHOULD BE REPORTED IMMEDIATELY:  *FEVER GREATER THAN 100.5 F  *CHILLS WITH OR WITHOUT FEVER  NAUSEA AND VOMITING THAT IS NOT CONTROLLED WITH YOUR NAUSEA MEDICATION  *UNUSUAL SHORTNESS OF BREATH  *UNUSUAL BRUISING OR BLEEDING  TENDERNESS IN MOUTH AND THROAT WITH OR WITHOUT PRESENCE OF ULCERS  *URINARY PROBLEMS  *BOWEL PROBLEMS  UNUSUAL RASH Items with * indicate a potential emergency and should be followed up as soon as possible.  Feel free to call the clinic should you have any questions or concerns. The clinic phone number is [phone].  Please show the CHEMO ALERT CARD at check-in to the Emergency Department and triage nurse.   

## 2017-02-15 NOTE — Progress Notes (Signed)
OK to treat per Jason Downer NP with creatinine level today

## 2017-02-19 ENCOUNTER — Other Ambulatory Visit: Payer: Self-pay | Admitting: *Deleted

## 2017-02-19 DIAGNOSIS — C3411 Malignant neoplasm of upper lobe, right bronchus or lung: Secondary | ICD-10-CM

## 2017-02-27 ENCOUNTER — Ambulatory Visit (INDEPENDENT_AMBULATORY_CARE_PROVIDER_SITE_OTHER): Payer: Medicare Other | Admitting: *Deleted

## 2017-02-27 ENCOUNTER — Other Ambulatory Visit: Payer: Self-pay

## 2017-02-27 DIAGNOSIS — I428 Other cardiomyopathies: Secondary | ICD-10-CM

## 2017-02-27 DIAGNOSIS — Z95810 Presence of automatic (implantable) cardiac defibrillator: Secondary | ICD-10-CM

## 2017-02-27 DIAGNOSIS — I5022 Chronic systolic (congestive) heart failure: Secondary | ICD-10-CM

## 2017-02-27 MED ORDER — ALBUTEROL SULFATE HFA 108 (90 BASE) MCG/ACT IN AERS: 2.0000 | INHALATION_SPRAY | RESPIRATORY_TRACT | 2 refills | Status: DC | PRN

## 2017-02-28 NOTE — Progress Notes (Signed)
Remote ICD transmission.   

## 2017-03-01 ENCOUNTER — Encounter: Payer: Self-pay | Admitting: Oncology

## 2017-03-01 ENCOUNTER — Ambulatory Visit (HOSPITAL_BASED_OUTPATIENT_CLINIC_OR_DEPARTMENT_OTHER): Payer: Medicare Other

## 2017-03-01 ENCOUNTER — Telehealth: Payer: Self-pay

## 2017-03-01 ENCOUNTER — Other Ambulatory Visit (HOSPITAL_BASED_OUTPATIENT_CLINIC_OR_DEPARTMENT_OTHER): Payer: Medicare Other

## 2017-03-01 ENCOUNTER — Ambulatory Visit (HOSPITAL_BASED_OUTPATIENT_CLINIC_OR_DEPARTMENT_OTHER): Payer: Medicare Other | Admitting: Oncology

## 2017-03-01 VITALS — BP 155/74 | HR 66 | Temp 97.3°F | Resp 18 | Ht 73.0 in | Wt 293.2 lb

## 2017-03-01 DIAGNOSIS — R1310 Dysphagia, unspecified: Secondary | ICD-10-CM | POA: Diagnosis not present

## 2017-03-01 DIAGNOSIS — Z79899 Other long term (current) drug therapy: Secondary | ICD-10-CM

## 2017-03-01 DIAGNOSIS — C3411 Malignant neoplasm of upper lobe, right bronchus or lung: Secondary | ICD-10-CM

## 2017-03-01 DIAGNOSIS — Z5112 Encounter for antineoplastic immunotherapy: Secondary | ICD-10-CM | POA: Diagnosis present

## 2017-03-01 LAB — COMPREHENSIVE METABOLIC PANEL
ALT: 16 U/L (ref 0–55)
AST: 20 U/L (ref 5–34)
Albumin: 3.3 g/dL — ABNORMAL LOW (ref 3.5–5.0)
Alkaline Phosphatase: 115 U/L (ref 40–150)
Anion Gap: 7 mEq/L (ref 3–11)
BUN: 22.8 mg/dL (ref 7.0–26.0)
CO2: 26 meq/L (ref 22–29)
Calcium: 9.3 mg/dL (ref 8.4–10.4)
Chloride: 110 mEq/L — ABNORMAL HIGH (ref 98–109)
Creatinine: 1.5 mg/dL — ABNORMAL HIGH (ref 0.7–1.3)
EGFR: 55 mL/min/{1.73_m2} — AB (ref >60)
Glucose: 96 mg/dl (ref 70–140)
POTASSIUM: 3.7 meq/L (ref 3.5–5.1)
SODIUM: 143 meq/L (ref 136–145)
Total Bilirubin: 0.37 mg/dL (ref 0.20–1.20)
Total Protein: 6.9 g/dL (ref 6.4–8.3)

## 2017-03-01 LAB — CBC WITH DIFFERENTIAL/PLATELET
BASO%: 0.3 % (ref 0.0–2.0)
BASOS ABS: 0 10*3/uL (ref 0.0–0.1)
EOS%: 3.1 % (ref 0.0–7.0)
Eosinophils Absolute: 0.1 10*3/uL (ref 0.0–0.5)
HEMATOCRIT: 36 % — AB (ref 38.4–49.9)
HGB: 11.2 g/dL — ABNORMAL LOW (ref 13.0–17.1)
LYMPH#: 0.8 10*3/uL — AB (ref 0.9–3.3)
LYMPH%: 19.8 % (ref 14.0–49.0)
MCH: 28.4 pg (ref 27.2–33.4)
MCHC: 31.1 g/dL — ABNORMAL LOW (ref 32.0–36.0)
MCV: 91.1 fL (ref 79.3–98.0)
MONO#: 0.4 10*3/uL (ref 0.1–0.9)
MONO%: 10.2 % (ref 0.0–14.0)
NEUT#: 2.6 10*3/uL (ref 1.5–6.5)
NEUT%: 66.6 % (ref 39.0–75.0)
Platelets: 130 10*3/uL — ABNORMAL LOW (ref 140–400)
RBC: 3.95 10*6/uL — AB (ref 4.20–5.82)
RDW: 16.4 % — AB (ref 11.0–14.6)
WBC: 3.8 10*3/uL — ABNORMAL LOW (ref 4.0–10.3)

## 2017-03-01 LAB — RESEARCH LABS

## 2017-03-01 LAB — TSH: TSH: 1.442 m(IU)/L (ref 0.320–4.118)

## 2017-03-01 MED ORDER — DURVALUMAB 500 MG/10ML IV SOLN
1240.0000 mg | Freq: Once | INTRAVENOUS | Status: AC
  Administered 2017-03-01: 1240 mg via INTRAVENOUS
  Filled 2017-03-01: qty 20

## 2017-03-01 MED ORDER — HEPARIN SOD (PORK) LOCK FLUSH 100 UNIT/ML IV SOLN
500.0000 [IU] | Freq: Once | INTRAVENOUS | Status: AC | PRN
  Administered 2017-03-01: 500 [IU]
  Filled 2017-03-01: qty 5

## 2017-03-01 MED ORDER — SODIUM CHLORIDE 0.9% FLUSH
10.0000 mL | INTRAVENOUS | Status: DC | PRN
  Administered 2017-03-01: 10 mL
  Filled 2017-03-01: qty 10

## 2017-03-01 MED ORDER — SODIUM CHLORIDE 0.9 % IV SOLN
Freq: Once | INTRAVENOUS | Status: AC
  Administered 2017-03-01: 11:00:00 via INTRAVENOUS

## 2017-03-01 NOTE — Patient Instructions (Signed)
Durvalumab injection  What is this medicine?  DURVALUMAB (dur VAL ue mab) is a monoclonal antibody. It is used to treat urothelial cancer.  This medicine may be used for other purposes; ask your health care provider or pharmacist if you have questions.  COMMON BRAND NAME(S): IMFINZI  What should I tell my health care provider before I take this medicine?  They need to know if you have any of these conditions:  -diabetes  -immune system problems  -infection  -inflammatory bowel disease  -kidney disease  -liver disease  -lung or breathing disease  -lupus  -organ transplant  -stomach or intestine problems  -thyroid disease  -an unusual or allergic reaction to durvalumab, other medicines, foods, dyes, or preservatives  -pregnant or trying to get pregnant  -breast-feeding  How should I use this medicine?  This medicine is for infusion into a vein. It is given by a health care professional in a hospital or clinic setting.  A special MedGuide will be given to you before each treatment. Be sure to read this information carefully each time.  Talk to your pediatrician regarding the use of this medicine in children. Special care may be needed.  Overdosage: If you think you have taken too much of this medicine contact a poison control center or emergency room at once.  NOTE: This medicine is only for you. Do not share this medicine with others.  What if I miss a dose?  It is important not to miss your dose. Call your doctor or health care professional if you are unable to keep an appointment.  What may interact with this medicine?  Interactions have not been studied.  This list may not describe all possible interactions. Give your health care provider a list of all the medicines, herbs, non-prescription drugs, or dietary supplements you use. Also tell them if you smoke, drink alcohol, or use illegal drugs. Some items may interact with your medicine.  What should I watch for while using this medicine?  This drug may make you  feel generally unwell. Continue your course of treatment even though you feel ill unless your doctor tells you to stop.  You may need blood work done while you are taking this medicine.  Do not become pregnant while taking this medicine or for 3 months after stopping it. Women should inform their doctor if they wish to become pregnant or think they might be pregnant. There is a potential for serious side effects to an unborn child. Talk to your health care professional or pharmacist for more information. Do not breast-feed an infant while taking this medicine or for 3 months after stopping it.  What side effects may I notice from receiving this medicine?  Side effects that you should report to your doctor or health care professional as soon as possible:  -allergic reactions like skin rash, itching or hives, swelling of the face, lips, or tongue  -black, tarry stools  -bloody or watery diarrhea  -breathing problems  -change in emotions or moods  -change in sex drive  -changes in vision  -chest pain or chest tightness  -chills  -confusion  -cough  -facial flushing  -fever  -headache  -signs and symptoms of high blood sugar such as dizziness; dry mouth; dry skin; fruity breath; nausea; stomach pain; increased hunger or thirst; increased urination  -signs and symptoms of liver injury like dark yellow or brown urine; general ill feeling or flu-like symptoms; light-colored stools; loss of appetite; nausea; right upper belly pain;   unusually weak or tired; yellowing of the eyes or skin  -stomach pain  -trouble passing urine or change in the amount of urine  -weight gain or weight loss  Side effects that usually do not require medical attention (report these to your doctor or health care professional if they continue or are bothersome):  -bone pain  -constipation  -loss of appetite  -muscle pain  -nausea  -swelling of the ankles, feet, hands  -tiredness  This list may not describe all possible side effects. Call your doctor  for medical advice about side effects. You may report side effects to FDA at 1-800-FDA-1088.  Where should I keep my medicine?  This drug is given in a hospital or clinic and will not be stored at home.  NOTE: This sheet is a summary. It may not cover all possible information. If you have questions about this medicine, talk to your doctor, pharmacist, or health care provider.  © 2018 [employer]vier/Gold Standard (2015-10-22 15:50:36)

## 2017-03-01 NOTE — Telephone Encounter (Signed)
Remote ICM transmission received.  Attempted call to patient and left message to return call. 

## 2017-03-01 NOTE — Assessment & Plan Note (Signed)
This is a very pleasant 66 year old African-American male with a stage IIIa non-small cell lung cancer, adenocarcinoma.  The patient completed 6 weeks of concurrent chemoradiation with weekly carboplatin and paclitaxel and tolerated his treatment well except for odynophagia. The patient is currently on consolidation treatment with immunotherapy with Imfinzi (Durvalumab) status post 4 cycles. He is tolerating his treatment fairly well. I recommended for him to proceed with cycle #5 today as a schedule.  I will see him back for follow-up visit in 2 weeks for evaluation before starting cycle #6.  Creatinine is stable at 1.5.  Okay to treat with Imfinzi today as scheduled.  Will monitor creatinine every 2 weeks with his treatment.  The patient was advised to call immediately if he has any concerning symptoms in the interval. The patient voices understanding of current disease status and treatment options and is in agreement with the current care plan. All questions were answered. The patient knows to call the clinic with any problems, questions or concerns. We can certainly see the patient much sooner if necessary.

## 2017-03-01 NOTE — Progress Notes (Signed)
[place] OFFICE PROGRESS NOTE  Lucianne Lei, MD [address] [place] [address]  DIAGNOSIS: Stage IIIA (T1a, N2, M0) non-small cell lung cancer, adenocarcinoma presented with right upper lobe lung nodule in addition to mediastinal lymphadenopathy diagnosed in March 2018.  Biomarker Findings Tumor Mutational Burden - TMB-Intermediate (8 Muts/Mb) Microsatellite Status - MS-Stable Genomic Findings For a complete list of the genes assayed, please refer to the Appendix. ERBB2 amplification - equivocal? DNMT3A K433f*192 FUBP1 Q40* KEAP1 [id number] TP53 C275F 7 Disease relevant genes with no reportable alterations: EGFR, KRAS, ALK, BRAF, MET, RET, ROS1  PRIOR THERAPY: course of concurrent chemoradiation with weekly carboplatin for AUC of 2 and paclitaxel 45 MG/M2. Status post 6 cycles. Last cycle was given 10/16/2016.  CURRENT THERAPY: Consolidation treatment with immunotherapy with Imfinzi (Durvalumab) 10 MG/M2 every 2 weeks. First dose 12/21/2016.Status post 4 cycles.   INTERVAL HISTORY: MiDuvall Comes89.o. male returns for routine follow-up visit by himself.  The patient is feeling well and has no specific complaints.  He denies fevers and chills.  Denies chest pain, shortness breath, cough, hemoptysis.  Denies nausea, vomiting, constipation, diarrhea.  The patient is here for evaluation prior to cycle 5 of his immunotherapy.  MEDICAL HISTORY: Past Medical History:  Diagnosis Date  . Adenocarcinoma of right lung, stage 3 (HC[facility]5/23/2018  . Adenocarcinoma of right lung, stage 3 (HCDearborn5/23/2018  . Anemia   . Arthritis    "hx right hip"  . Asthma    "when I was a child"  . Atrial fibrillation (HCC)    Amiodarone started 10/2011; Coumadin  . 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 St. Jude ICD implantation 10/03/12.  . Chronic anticoagulation   . Chronic fatigue 10/18/2016  . Chronic fatigue 10/18/2016  . Chronic  systolic heart failure ([facility]   a. Echo 7/13: EF 25%;  b. echo 04/2012:  Mild LVH, EF 30-35%, Gr 1 DD, mild AI, mild MR, mild LAE  . COPD (chronic obstructive pulmonary disease) (HCElko  . Dyslipidemia   . Dysrhythmia   . Encounter for antineoplastic chemotherapy 08/24/2016  . Gout   . History of blood transfusion 10/15/2013   "don't know where the blood's going; HgB down to 5"  . Hyperlipidemia   . Hypertension   . Hypothyroidism   . NICM (nonischemic cardiomyopathy) (HCEspino   [facility] [place]/14:  minimal CAD  . Obesity   . OSA on CPAP   . Tobacco abuse     ALLERGIES:  has No Known Allergies.  MEDICATIONS:  Current Outpatient Medications  Medication Sig Dispense Refill  . acetaminophen (TYLENOL) 500 MG tablet Take 1,000 mg by mouth every 6 (six) hours as needed for mild pain.    . Marland Kitchenlbuterol (PROVENTIL HFA;VENTOLIN HFA) 108 (90 Base) MCG/ACT inhaler Inhale 2 puffs into the lungs every 4 (four) hours as needed for wheezing or shortness of breath. 1 Inhaler 2  . amiodarone (PACERONE) 100 MG tablet TAKE 1 TABLET BY MOUTH DAILY 90 tablet 1  . atorvastatin (LIPITOR) 20 MG tablet TAKE 1 TABLET BY MOUTH EVERY DAY 90 tablet 3  . carvedilol (COREG) 25 MG tablet TAKE 1 TABLET BY MOUTH TWICE A DAY WITH A MEAL 60 tablet 11  . [employer] D3 2000 units CAPS Take 2,000 Units by mouth daily.   11  . feeding supplement, ENSURE ENLIVE, (ENSURE ENLIVE) LIQD Take 237 mLs by mouth 2 (two) times daily between meals. 237 mL 12  .  ferrous gluconate (FERGON) 324 MG tablet Take 1 tablet (324 mg total) by mouth 3 (three) times daily with meals. 90 tablet 8  . Fluticasone-Umeclidin-Vilant (TRELEGY ELLIPTA) 100-62.5-25 MCG/INH AEPB Inhale 1 puff into the lungs daily. 1 each 6  . furosemide (LASIX) 20 MG tablet Take 3 tabs (60 mg) every Tue and Sat, all other days take 2 tabs (40 mg) (Patient taking differently: Take 40-60 mg by mouth See admin instructions. Take 3 tabs (60 mg) every Tue and Sat, all other days take 2 tabs (40 mg)) 64  tablet 6  . hydrALAZINE (APRESOLINE) 100 MG tablet Take 100 mg by mouth 3 (three) times daily.   1  . levothyroxine (SYNTHROID, LEVOTHROID) 50 MCG tablet TAKE 1 TABLET (50 MCG TOTAL) BY MOUTH DAILY BEFORE BREAKFAST. 90 tablet 3  . lidocaine-prilocaine (EMLA) cream Apply generous amount to port site at least 1 hr prior to treatment.  DO NOT RUB IN. 30 g 1  . lisinopril (PRINIVIL,ZESTRIL) 40 MG tablet TAKE 1 TABLET BY MOUTH DAILY. 90 tablet 2  . magnesium oxide (MAG-OX) 400 (241.3 Mg) MG tablet Take 1 tablet (400 mg total) by mouth daily. 30 tablet 0  . potassium chloride SA (KLOR-CON M20) 20 MEQ tablet Take 2 tablets (40 mEq total) by mouth daily. 60 tablet 0  . prochlorperazine (COMPAZINE) 10 MG tablet Take 1 tablet (10 mg total) by mouth every 6 (six) hours as needed for nausea or vomiting. 30 tablet 0  . rivaroxaban (XARELTO) 20 MG TABS tablet Take 1 tablet (20 mg total) by mouth daily. 90 tablet 2  . spironolactone (ALDACTONE) 25 MG tablet TAKE 0.5 TABLETS (12.5 MG TOTAL) BY MOUTH DAILY. 45 tablet 2  . sucralfate (CARAFATE) 1 g tablet TAKE 1 TABLET BY MOUTH FOUR TIMES DAILY (5 MINUTES BEFORE MEALS AND AT BEDTIME) FOR ESOPHAGITIS 120 tablet 2  . ULORIC 40 MG tablet Take 1 tablet (40 mg total) by mouth daily. 30 tablet 0  . buPROPion (WELLBUTRIN SR) 150 MG 12 hr tablet TAKE 1 TABLET (150 MG TOTAL) BY MOUTH 2 (TWO) TIMES DAILY. (Patient not taking: Reported on 01/18/2017) 180 tablet 2   No current facility-administered medications for this visit.    Facility-Administered Medications Ordered in Other Visits  Medication Dose Route Frequency Provider Last Rate Last Dose  . durvalumab (IMFINZI) 1,240 mg in sodium chloride 0.9 % 100 mL chemo infusion  1,240 mg Intravenous Once Curt Bears, MD 125 mL/hr at 03/01/17 1150 1,240 mg at 03/01/17 1150  . heparin lock flush 100 unit/mL  500 Units Intracatheter Once PRN Curt Bears, MD      . sodium chloride flush (NS) 0.9 % injection 10 mL  10 mL  Intracatheter PRN Curt Bears, MD        SURGICAL HISTORY:  Past Surgical History:  Procedure Laterality Date  . CARDIAC DEFIBRILLATOR PLACEMENT  2014  . CARDIOVERSION  2011  . COLONOSCOPY WITH PROPOFOL Left 10/17/2013   Procedure: COLONOSCOPY WITH PROPOFOL;  Surgeon: Inda Castle, MD;  Location: [facility];  Service: Endoscopy;  Laterality: Left;  . ESOPHAGOGASTRODUODENOSCOPY N/A 10/17/2013   Procedure: ESOPHAGOGASTRODUODENOSCOPY (EGD);  Surgeon: Inda Castle, MD;  Location: [facility];  Service: Endoscopy;  Laterality: N/A;  . GIVENS CAPSULE STUDY N/A 10/29/2013   Procedure: GIVENS CAPSULE STUDY;  Surgeon: Inda Castle, MD;  Location: [facility];  Service: Endoscopy;  Laterality: N/A;  . IMPLANTABLE CARDIOVERTER DEFIBRILLATOR IMPLANT Left 10/03/2012   Procedure: IMPLANTABLE CARDIOVERTER DEFIBRILLATOR IMPLANT;  Surgeon: Revonda Standard  Caryl Comes, MD;  Location: [facility] CATH LAB;  Service: Cardiovascular;  Laterality: Left;  . IR FLUORO GUIDE PORT INSERTION RIGHT  09/21/2016  . IR US GUIDE VASC ACCESS RIGHT  09/21/2016  . JOINT REPLACEMENT    . TONSILLECTOMY  1950's  . TOTAL HIP ARTHROPLASTY Right 11/25/1997  . VIDEO BRONCHOSCOPY WITH ENDOBRONCHIAL ULTRASOUND N/A 08/09/2016   Procedure: VIDEO BRONCHOSCOPY WITH ENDOBRONCHIAL ULTRASOUND;  Surgeon: Javier Glazier, MD;  Location: [facility] OR;  Service: Thoracic;  Laterality: N/A;    REVIEW OF SYSTEMS:   Review of Systems  Constitutional: Negative for appetite change, chills, fatigue, fever and unexpected weight change.  HENT:   Negative for mouth sores, nosebleeds, sore throat and trouble swallowing.   Eyes: Negative for eye problems and icterus.  Respiratory: Negative for cough, hemoptysis, shortness of breath and wheezing.   Cardiovascular: Negative for chest pain and leg swelling.  Gastrointestinal: Negative for abdominal pain, constipation, diarrhea, nausea and vomiting.  Genitourinary: Negative for bladder incontinence, difficulty  urinating, dysuria, frequency and hematuria.   Musculoskeletal: Negative for back pain, gait problem, neck pain and neck stiffness.  Skin: Negative for itching and rash.  Neurological: Negative for dizziness, extremity weakness, gait problem, headaches, light-headedness and seizures.  Hematological: Negative for adenopathy. Does not bruise/bleed easily.  Psychiatric/Behavioral: Negative for confusion, depression and sleep disturbance. The patient is not nervous/anxious.     PHYSICAL EXAMINATION:  Blood pressure (!) 155/74, pulse 66, temperature (!) 97.3 F (36.3 C), temperature source Oral, resp. rate 18, height '6\' 1"'$  (1.854 m), weight 293 lb 3.2 oz (133 kg), SpO2 100 %.  ECOG PERFORMANCE STATUS: 1 - Symptomatic but completely ambulatory  Physical Exam  Constitutional: Oriented to person, place, and time and well-developed, well-nourished, and in no distress. No distress.  HENT:  Head: Normocephalic and atraumatic.  Mouth/Throat: Oropharynx is clear and moist. No oropharyngeal exudate.  Eyes: Conjunctivae are normal. Right eye exhibits no discharge. Left eye exhibits no discharge. No scleral icterus.  Neck: Normal range of motion. Neck supple.  Cardiovascular: Normal rate, regular rhythm, normal heart sounds and intact distal pulses.   Pulmonary/Chest: Effort normal and breath sounds normal. No respiratory distress. No wheezes. No rales.  Abdominal: Soft. Bowel sounds are normal. Exhibits no distension and no mass. There is no tenderness.  Musculoskeletal: Normal range of motion. Exhibits no edema.  Lymphadenopathy:    No cervical adenopathy.  Neurological: Alert and oriented to person, place, and time. Exhibits normal muscle tone. Gait normal. Coordination normal.  Skin: Skin is warm and dry. No rash noted. Not diaphoretic. No erythema. No pallor.  Psychiatric: Mood, memory and judgment normal.  Vitals reviewed.  LABORATORY DATA: Lab Results  Component Value Date   WBC 3.8 (L)  03/01/2017   HGB 11.2 (L) 03/01/2017   HCT 36.0 (L) 03/01/2017   MCV 91.1 03/01/2017   PLT 130 (L) 03/01/2017      Chemistry      Component Value Date/Time   NA 143 03/01/2017 0941   K 3.7 03/01/2017 0941   CL 104 01/15/2017 0854   CO2 26 03/01/2017 0941   BUN 22.8 03/01/2017 0941   CREATININE 1.5 (H) 03/01/2017 0941      Component Value Date/Time   CALCIUM 9.3 03/01/2017 0941   ALKPHOS 115 03/01/2017 0941   AST 20 03/01/2017 0941   ALT 16 03/01/2017 0941   BILITOT 0.37 03/01/2017 0941       RADIOGRAPHIC STUDIES:  No results found.   ASSESSMENT/PLAN:  Primary cancer of  right upper lobe of lung [facility]) This is a very pleasant 66 year old African-American male with a stage IIIa non-small cell lung cancer, adenocarcinoma.  The patient completed 6 weeks of concurrent chemoradiation with weekly carboplatin and paclitaxel and tolerated his treatment well except for odynophagia. The patient is currently on consolidation treatment with immunotherapy with Imfinzi (Durvalumab) status post 4 cycles. He is tolerating his treatment fairly well. I recommended for him to proceed with cycle #5 today as a schedule.  I will see him back for follow-up visit in 2 weeks for evaluation before starting cycle #6.  Creatinine is stable at 1.5.  Okay to treat with Imfinzi today as scheduled.  Will monitor creatinine every 2 weeks with his treatment.  The patient was advised to call immediately if he has any concerning symptoms in the interval. The patient voices understanding of current disease status and treatment options and is in agreement with the current care plan. All questions were answered. The patient knows to call the clinic with any problems, questions or concerns. We can certainly see the patient much sooner if necessary.  No orders of the defined types were placed in this encounter.   Mikey Bussing, DNP, AGPCNP-BC, AOCNP 03/01/17

## 2017-03-01 NOTE — Progress Notes (Signed)
EPIC Encounter for ICM Monitoring  Patient Name: Jason Russell is a 66 y.o. male Date: 03/01/2017 Primary Care Physican: Lucianne Lei, MD Primary Cardiologist:Ross Electrophysiologist: Faustino Congress Weight: 286 lbs       Heart failure questions reviewed and patient denied any fluid symptoms.  He stated he is feeling fine.      Thoracic impedance normal but was abnormal suggesting fluid accumulation from 11/1 to 11/8 and 11/22 to 11/24.  Prescribed dosage: Furosemide 20 mg 3 tablets (60 mg total) every Tuesday and Saturday and 40 mg all the other days. Potassium 20 mEq 1 tablet every day and on days when lasix is taken, take 2 tablets  Labs: 12/21/2016 Creatinine 1.2, BUN 22.8, Potassium 3.7, Sodium 141, EGFR 52 11/10/2016 Creatinine 1.7, BUN 23, Potassium 3.6, Sodium 143, EGFR 49 10/16/2016 Creatinine 2.0, BUN 28.4, Potassium 4.1, Sodium 141, EGFR 40  10/09/2016 Creatinine 1.5, BUN 25.7, Potassium 4.1, Sodium 143, EGFR 55  10/02/2016 Creatinine 1.6, BUN 20.4, Potassium 3.9, Sodium 147, EGFR 53  09/25/2016 Creatinine 1.3, BUN 21, Potassium 4.1, Sodium 142, EGFR 65  09/21/2016 Creatinine 1.50, BUN 29, Potassium 3.7, Sodium 143  09/18/2016 Creatinine 1.9, BUN 25.8, Potassium 3.2, Sodium 144, EGFR 43  09/11/2016 Creatinine 1.6, BUN 32.9, Potassium 3.7, Sodium 142, EGFR 51  09/05/2016 Creatinine 1.6, BUN 19,Potassium 3.3, Sodium 145 08/24/2016 Creatinine 1.6, BUN 22,Potassium 3.4, Sodium 143  Recommendations: No changes and encouraged to call for any fluid symptoms.    Follow-up plan: ICM clinic phone appointment on 04/05/2017.    Copy of ICM check sent to Dr. Caryl Comes.   3 month ICM trend: 02/27/2017    1 Year ICM trend:       [name], RN 03/01/2017 3:34 PM

## 2017-03-02 ENCOUNTER — Encounter: Payer: Self-pay | Admitting: Cardiology

## 2017-03-11 LAB — CUP PACEART REMOTE DEVICE CHECK
Battery Remaining Percentage: 57 %
Battery Voltage: 2.98 V
Date Time Interrogation Session: 20181127070015
HIGH POWER IMPEDANCE MEASURED VALUE: 37 Ohm
HIGH POWER IMPEDANCE MEASURED VALUE: 37 Ohm
Implantable Lead Implant Date: 20140703
Implantable Pulse Generator Implant Date: 20140703
Lead Channel Impedance Value: 380 Ohm
Lead Channel Pacing Threshold Pulse Width: 0.5 ms
Lead Channel Sensing Intrinsic Amplitude: 11.3 mV
Lead Channel Setting Pacing Amplitude: 2.5 V
Lead Channel Setting Pacing Pulse Width: 0.5 ms
MDC IDC LEAD LOCATION: 753860
MDC IDC MSMT BATTERY REMAINING LONGEVITY: 61 mo
MDC IDC MSMT LEADCHNL RV PACING THRESHOLD AMPLITUDE: 0.5 V
MDC IDC PG SERIAL: 1105031
MDC IDC SET LEADCHNL RV SENSING SENSITIVITY: 0.5 mV
MDC IDC STAT BRADY RV PERCENT PACED: 1 %

## 2017-03-14 ENCOUNTER — Other Ambulatory Visit: Payer: Self-pay | Admitting: Internal Medicine

## 2017-03-14 DIAGNOSIS — I272 Pulmonary hypertension, unspecified: Secondary | ICD-10-CM | POA: Diagnosis not present

## 2017-03-14 DIAGNOSIS — I130 Hypertensive heart and chronic kidney disease with heart failure and stage 1 through stage 4 chronic kidney disease, or unspecified chronic kidney disease: Secondary | ICD-10-CM | POA: Diagnosis not present

## 2017-03-14 DIAGNOSIS — C3490 Malignant neoplasm of unspecified part of unspecified bronchus or lung: Secondary | ICD-10-CM | POA: Diagnosis not present

## 2017-03-15 ENCOUNTER — Ambulatory Visit (HOSPITAL_BASED_OUTPATIENT_CLINIC_OR_DEPARTMENT_OTHER): Payer: Medicare Other

## 2017-03-15 ENCOUNTER — Ambulatory Visit (HOSPITAL_BASED_OUTPATIENT_CLINIC_OR_DEPARTMENT_OTHER): Payer: Medicare Other | Admitting: Internal Medicine

## 2017-03-15 ENCOUNTER — Other Ambulatory Visit (HOSPITAL_BASED_OUTPATIENT_CLINIC_OR_DEPARTMENT_OTHER): Payer: Medicare Other

## 2017-03-15 ENCOUNTER — Telehealth: Payer: Self-pay | Admitting: Internal Medicine

## 2017-03-15 ENCOUNTER — Encounter: Payer: Self-pay | Admitting: Internal Medicine

## 2017-03-15 DIAGNOSIS — Z5112 Encounter for antineoplastic immunotherapy: Secondary | ICD-10-CM

## 2017-03-15 DIAGNOSIS — C3411 Malignant neoplasm of upper lobe, right bronchus or lung: Secondary | ICD-10-CM

## 2017-03-15 DIAGNOSIS — C3490 Malignant neoplasm of unspecified part of unspecified bronchus or lung: Secondary | ICD-10-CM

## 2017-03-15 LAB — COMPREHENSIVE METABOLIC PANEL
ALBUMIN: 3.6 g/dL (ref 3.5–5.0)
ALT: 16 U/L (ref 0–55)
AST: 19 U/L (ref 5–34)
Alkaline Phosphatase: 105 U/L (ref 40–150)
Anion Gap: 9 mEq/L (ref 3–11)
BUN: 33.6 mg/dL — ABNORMAL HIGH (ref 7.0–26.0)
CO2: 28 meq/L (ref 22–29)
Calcium: 9.3 mg/dL (ref 8.4–10.4)
Chloride: 105 mEq/L (ref 98–109)
Creatinine: 1.7 mg/dL — ABNORMAL HIGH (ref 0.7–1.3)
EGFR: 49 mL/min/{1.73_m2} — ABNORMAL LOW (ref >60)
GLUCOSE: 104 mg/dL (ref 70–140)
POTASSIUM: 3.7 meq/L (ref 3.5–5.1)
SODIUM: 142 meq/L (ref 136–145)
TOTAL PROTEIN: 7.1 g/dL (ref 6.4–8.3)
Total Bilirubin: 0.33 mg/dL (ref 0.20–1.20)

## 2017-03-15 LAB — CBC WITH DIFFERENTIAL/PLATELET
BASO%: 0.6 % (ref 0.0–2.0)
Basophils Absolute: 0 10*3/uL (ref 0.0–0.1)
EOS%: 3 % (ref 0.0–7.0)
Eosinophils Absolute: 0.1 10*3/uL (ref 0.0–0.5)
HCT: 36 % — ABNORMAL LOW (ref 38.4–49.9)
HEMOGLOBIN: 11.6 g/dL — AB (ref 13.0–17.1)
LYMPH%: 12.4 % — AB (ref 14.0–49.0)
MCH: 28.4 pg (ref 27.2–33.4)
MCHC: 32.1 g/dL (ref 32.0–36.0)
MCV: 88.6 fL (ref 79.3–98.0)
MONO#: 0.7 10*3/uL (ref 0.1–0.9)
MONO%: 18 % — AB (ref 0.0–14.0)
NEUT%: 66 % (ref 39.0–75.0)
NEUTROS ABS: 2.5 10*3/uL (ref 1.5–6.5)
Platelets: 144 10*3/uL (ref 140–400)
RBC: 4.06 10*6/uL — AB (ref 4.20–5.82)
RDW: 17.4 % — AB (ref 11.0–14.6)
WBC: 3.8 10*3/uL — AB (ref 4.0–10.3)
lymph#: 0.5 10*3/uL — ABNORMAL LOW (ref 0.9–3.3)

## 2017-03-15 MED ORDER — SODIUM CHLORIDE 0.9% FLUSH
10.0000 mL | INTRAVENOUS | Status: DC | PRN
  Administered 2017-03-15: 10 mL
  Filled 2017-03-15: qty 10

## 2017-03-15 MED ORDER — SODIUM CHLORIDE 0.9 % IV SOLN
1240.0000 mg | Freq: Once | INTRAVENOUS | Status: AC
  Administered 2017-03-15: 1240 mg via INTRAVENOUS
  Filled 2017-03-15: qty 20

## 2017-03-15 MED ORDER — SODIUM CHLORIDE 0.9 % IV SOLN
Freq: Once | INTRAVENOUS | Status: AC
  Administered 2017-03-15: 11:00:00 via INTRAVENOUS

## 2017-03-15 MED ORDER — HEPARIN SOD (PORK) LOCK FLUSH 100 UNIT/ML IV SOLN
500.0000 [IU] | Freq: Once | INTRAVENOUS | Status: AC | PRN
  Administered 2017-03-15: 500 [IU]
  Filled 2017-03-15: qty 5

## 2017-03-15 NOTE — Progress Notes (Signed)
Ok to treat today with Cr 1.7 per MD Julien Nordmann.

## 2017-03-15 NOTE — Patient Instructions (Signed)
[facility] Discharge Instructions for Patients Receiving Chemotherapy  Today you received the following chemotherapy agents: Imfinzi.  To help prevent nausea and vomiting after your treatment, we encourage you to take your nausea medication as directed.   If you develop nausea and vomiting that is not controlled by your nausea medication, call the clinic.   BELOW ARE SYMPTOMS THAT SHOULD BE REPORTED IMMEDIATELY:  *FEVER GREATER THAN 100.5 F  *CHILLS WITH OR WITHOUT FEVER  NAUSEA AND VOMITING THAT IS NOT CONTROLLED WITH YOUR NAUSEA MEDICATION  *UNUSUAL SHORTNESS OF BREATH  *UNUSUAL BRUISING OR BLEEDING  TENDERNESS IN MOUTH AND THROAT WITH OR WITHOUT PRESENCE OF ULCERS  *URINARY PROBLEMS  *BOWEL PROBLEMS  UNUSUAL RASH Items with * indicate a potential emergency and should be followed up as soon as possible.  Feel free to call the clinic should you have any questions or concerns. The clinic phone number is [phone].  Please show the CHEMO ALERT CARD at check-in to the Emergency Department and triage nurse.   

## 2017-03-15 NOTE — Telephone Encounter (Signed)
No additional appts to schedule per 12/13 - will add additional appts next cycle- patient already has 3 cycles scheduled . Central radiology to contact patient with ct scan

## 2017-03-15 NOTE — Progress Notes (Signed)
[place] Telephone:(336) [phone]   Fax:(336) [phone]  OFFICE PROGRESS NOTE  Lucianne Lei, MD [address] [place] [address]  DIAGNOSIS: Stage IIIA (T1a, N2, M0) non-small cell lung cancer, adenocarcinoma presented with right upper lobe lung nodule in addition to mediastinal lymphadenopathy diagnosed in March 2018.  Biomarker Findings Tumor Mutational Burden - TMB-Intermediate (8 Muts/Mb) Microsatellite Status - MS-Stable Genomic Findings For a complete list of the genes assayed, please refer to the Appendix. ERBB2 amplification - equivocal? DNMT3A K441f*192 FUBP1 Q40* KEAP1 [id number] TP53 C275F 7 Disease relevant genes with no reportable alterations: EGFR, KRAS, ALK, BRAF, MET, RET, ROS1   PRIOR THERAPY: course of concurrent chemoradiation with weekly carboplatin for AUC of 2 and paclitaxel 45 MG/M2. Status post 6 cycles. Last cycle was given 10/16/2016.  CURRENT THERAPY:  Consolidation treatment with immunotherapy with Imfinzi (Durvalumab) 10 MG/M2 every 2 weeks. First dose 12/21/2016.  Status post 5 cycles.  INTERVAL HISTORY: Jason Holzhauer66o. male returns to the clinic today for follow-up visit.  The patient has no complaints today.  He continues to tolerate his treatment with Imfinzi (Durvalumab) fairly well.  He denied having any chest pain, shortness breath, cough or hemoptysis.  He denied having any weight loss or night sweats.  He has no nausea, vomiting, diarrhea or constipation.  He is here today for evaluation before starting cycle #6.  MEDICAL HISTORY: Past Medical History:  Diagnosis Date  . Adenocarcinoma of right lung, stage 3 (HCEland5/23/2018  . Adenocarcinoma of right lung, stage 3 (HCAda5/23/2018  . Anemia   . Arthritis    "hx right hip"  . Asthma    "when I was a child"  . Atrial fibrillation (HCC)    Amiodarone started 10/2011; Coumadin  . 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  St. Jude ICD implantation 10/03/12.  . Chronic anticoagulation   . Chronic fatigue 10/18/2016  . Chronic fatigue 10/18/2016  . Chronic systolic heart failure ([facility]CEast [place]   a. Echo 7/13: EF 25%;  b. echo 04/2012:  Mild LVH, EF 30-35%, Gr 1 DD, mild AI, mild MR, mild LAE  . COPD (chronic obstructive pulmonary disease) (HCPocahontas  . Dyslipidemia   . Dysrhythmia   . Encounter for antineoplastic chemotherapy 08/24/2016  . Gout   . History of blood transfusion 10/15/2013   "don't know where the blood's going; HgB down to 5"  . Hyperlipidemia   . Hypertension   . Hypothyroidism   . NICM (nonischemic cardiomyopathy) (HCHigh[facility]   [facility]/14:  minimal CAD  . Obesity   . OSA on CPAP   . Tobacco abuse     ALLERGIES:  has No Known Allergies.  MEDICATIONS:  Current Outpatient Medications  Medication Sig Dispense Refill  . albuterol (PROVENTIL HFA;VENTOLIN HFA) 108 (90 Base) MCG/ACT inhaler Inhale 2 puffs into the lungs every 6 (six) hours as needed.    . Acetaminophen (TYLENOL) 325 MG CAPS Take 325 mg by mouth daily as needed.    . Marland Kitchencetaminophen (TYLENOL) 500 MG tablet Take 1,000 mg by mouth every 6 (six) hours as needed for mild pain.    . Marland Kitchenlbuterol (PROVENTIL HFA;VENTOLIN HFA) 108 (90 Base) MCG/ACT inhaler Inhale 2 puffs into the lungs every 4 (four) hours as needed for wheezing or shortness of breath. 1 Inhaler 2  . amiodarone (PACERONE) 100 MG tablet TAKE 1 TABLET BY MOUTH DAILY 90 tablet 1  . atorvastatin (LIPITOR) 20  MG tablet TAKE 1 TABLET BY MOUTH EVERY DAY 90 tablet 3  . carvedilol (COREG) 25 MG tablet TAKE 1 TABLET BY MOUTH TWICE A DAY WITH A MEAL 60 tablet 11  . CVS D3 2000 units CAPS Take 2,000 Units by mouth daily.   11  . feeding supplement, ENSURE ENLIVE, (ENSURE ENLIVE) LIQD Take 237 mLs by mouth 2 (two) times daily between meals. 237 mL 12  . ferrous gluconate (FERGON) 324 MG tablet Take 1 tablet (324 mg total) by mouth 3 (three) times daily with meals. 90 tablet 8  . Fluticasone-Umeclidin-Vilant  (TRELEGY ELLIPTA) 100-62.5-25 MCG/INH AEPB Inhale 1 puff into the lungs daily. 1 each 6  . furosemide (LASIX) 20 MG tablet Take 3 tabs (60 mg) every Tue and Sat, all other days take 2 tabs (40 mg) (Patient taking differently: Take 40-60 mg by mouth See admin instructions. Take 3 tabs (60 mg) every Tue and Sat, all other days take 2 tabs (40 mg)) 64 tablet 6  . hydrALAZINE (APRESOLINE) 100 MG tablet Take 100 mg by mouth 3 (three) times daily.   1  . levothyroxine (SYNTHROID, LEVOTHROID) 50 MCG tablet TAKE 1 TABLET (50 MCG TOTAL) BY MOUTH DAILY BEFORE BREAKFAST. 90 tablet 3  . lidocaine-prilocaine (EMLA) cream Apply generous amount to port site at least 1 hr prior to treatment.  DO NOT RUB IN. 30 g 1  . lisinopril (PRINIVIL,ZESTRIL) 40 MG tablet TAKE 1 TABLET BY MOUTH DAILY. 90 tablet 2  . magnesium oxide (MAG-OX) 400 (241.3 Mg) MG tablet Take 1 tablet (400 mg total) by mouth daily. 30 tablet 0  . potassium chloride SA (KLOR-CON M20) 20 MEQ tablet Take 2 tablets (40 mEq total) by mouth daily. 60 tablet 0  . prochlorperazine (COMPAZINE) 10 MG tablet Take 1 tablet (10 mg total) by mouth every 6 (six) hours as needed for nausea or vomiting. 30 tablet 0  . rivaroxaban (XARELTO) 20 MG TABS tablet Take 1 tablet (20 mg total) by mouth daily. 90 tablet 2  . sildenafil (VIAGRA) 100 MG tablet Take 100 mg by mouth.    . spironolactone (ALDACTONE) 25 MG tablet TAKE 0.5 TABLETS (12.5 MG TOTAL) BY MOUTH DAILY. 45 tablet 2  . sucralfate (CARAFATE) 1 g tablet TAKE 1 TABLET BY MOUTH FOUR TIMES DAILY (5 MINUTES BEFORE MEALS AND AT BEDTIME) FOR ESOPHAGITIS 120 tablet 2  . ULORIC 40 MG tablet Take 1 tablet (40 mg total) by mouth daily. 30 tablet 0  . Vitamin D, Ergocalciferol, (DRISDOL) 50000 units CAPS capsule Take 50,000 Units by mouth daily.     No current facility-administered medications for this visit.     SURGICAL HISTORY:  Past Surgical History:  Procedure Laterality Date  . CARDIAC DEFIBRILLATOR PLACEMENT   2014  . CARDIOVERSION  2011  . COLONOSCOPY WITH PROPOFOL Left 10/17/2013   Procedure: COLONOSCOPY WITH PROPOFOL;  Surgeon: Inda Castle, MD;  Location: [facility];  Service: Endoscopy;  Laterality: Left;  . ESOPHAGOGASTRODUODENOSCOPY N/A 10/17/2013   Procedure: ESOPHAGOGASTRODUODENOSCOPY (EGD);  Surgeon: Inda Castle, MD;  Location: [facility];  Service: Endoscopy;  Laterality: N/A;  . GIVENS CAPSULE STUDY N/A 10/29/2013   Procedure: GIVENS CAPSULE STUDY;  Surgeon: Inda Castle, MD;  Location: [facility];  Service: Endoscopy;  Laterality: N/A;  . IMPLANTABLE CARDIOVERTER DEFIBRILLATOR IMPLANT Left 10/03/2012   Procedure: IMPLANTABLE CARDIOVERTER DEFIBRILLATOR IMPLANT;  Surgeon: Deboraha Sprang, MD;  Location: [facility] CATH LAB;  Service: Cardiovascular;  Laterality: Left;  . IR FLUORO GUIDE PORT  INSERTION RIGHT  09/21/2016  . IR US GUIDE VASC ACCESS RIGHT  09/21/2016  . JOINT REPLACEMENT    . TONSILLECTOMY  1950's  . TOTAL HIP ARTHROPLASTY Right 11/25/1997  . VIDEO BRONCHOSCOPY WITH ENDOBRONCHIAL ULTRASOUND N/A 08/09/2016   Procedure: VIDEO BRONCHOSCOPY WITH ENDOBRONCHIAL ULTRASOUND;  Surgeon: Javier Glazier, MD;  Location: [facility];  Service: Thoracic;  Laterality: N/A;    REVIEW OF SYSTEMS:  A comprehensive review of systems was negative.   PHYSICAL EXAMINATION: General appearance: alert, cooperative and no distress Head: Normocephalic, without obvious abnormality, atraumatic Neck: no adenopathy, no JVD, supple, symmetrical, trachea midline and thyroid not enlarged, symmetric, no tenderness/mass/nodules Lymph nodes: Cervical, supraclavicular, and axillary nodes normal. Resp: clear to auscultation bilaterally Back: symmetric, no curvature. ROM normal. No CVA tenderness. Cardio: regular rate and rhythm, S1, S2 normal, no murmur, click, rub or gallop GI: soft, non-tender; bowel sounds normal; no masses,  no organomegaly Extremities: extremities normal, atraumatic, no cyanosis or  edema  ECOG PERFORMANCE STATUS: 1 - Symptomatic but completely ambulatory  Blood pressure 124/69, pulse 86, temperature 98.8 F (37.1 C), temperature source Oral, resp. rate 20, weight 292 lb 3.2 oz (132.5 kg), SpO2 98 %.  LABORATORY DATA: Lab Results  Component Value Date   WBC 3.8 (L) 03/15/2017   HGB 11.6 (L) 03/15/2017   HCT 36.0 (L) 03/15/2017   MCV 88.6 03/15/2017   PLT 144 03/15/2017      Chemistry      Component Value Date/Time   NA 143 03/01/2017 0941   K 3.7 03/01/2017 0941   CL 104 01/15/2017 0854   CO2 26 03/01/2017 0941   BUN 22.8 03/01/2017 0941   CREATININE 1.5 (H) 03/01/2017 0941      Component Value Date/Time   CALCIUM 9.3 03/01/2017 0941   ALKPHOS 115 03/01/2017 0941   AST 20 03/01/2017 0941   ALT 16 03/01/2017 0941   BILITOT 0.37 03/01/2017 0941       RADIOGRAPHIC STUDIES: No results found.  ASSESSMENT AND PLAN:  This is a very pleasant 66 years old African-American male with a stage IIIa non-small cell lung cancer, adenocarcinoma.  The patient completed 6 weeks of concurrent chemoradiation with weekly carboplatin and paclitaxel and tolerated his treatment well except for odynophagia. The patient is currently on consolidation treatment with immunotherapy with Imfinzi (Durvalumab) status post 5 cycles.   The patient tolerated the last cycle of his treatment well with no concerning complaints. I recommended for him to proceed with cycle #6 today as a scheduled. The patient will come back for follow-up visit in 2 weeks for evaluation after repeating CT scan of the chest for restaging of his disease. He was advised to call immediately if he has any concerning symptoms in the interval. The patient voices understanding of current disease status and treatment options and is in agreement with the current care plan. All questions were answered. The patient knows to call the clinic with any problems, questions or concerns. We can certainly see the patient  much sooner if necessary. I spent 10 minutes counseling the patient face to face. The total time spent in the appointment was 15 minutes.  Disclaimer: This note was dictated with voice recognition software. Similar sounding words can inadvertently be transcribed and may not be corrected upon review.

## 2017-03-16 ENCOUNTER — Ambulatory Visit: Payer: Medicare Other | Admitting: Physician Assistant

## 2017-03-23 ENCOUNTER — Ambulatory Visit (HOSPITAL_COMMUNITY)
Admission: RE | Admit: 2017-03-23 | Discharge: 2017-03-23 | Disposition: A | Discharge status: Home / Self Care | Payer: Medicare Other | Source: Ambulatory Visit | Attending: Internal Medicine | Admitting: Internal Medicine

## 2017-03-23 ENCOUNTER — Encounter (HOSPITAL_COMMUNITY): Payer: Self-pay

## 2017-03-23 DIAGNOSIS — C3490 Malignant neoplasm of unspecified part of unspecified bronchus or lung: Secondary | ICD-10-CM | POA: Diagnosis not present

## 2017-03-23 DIAGNOSIS — J701 Chronic and other pulmonary manifestations due to radiation: Secondary | ICD-10-CM | POA: Diagnosis not present

## 2017-03-23 DIAGNOSIS — I517 Cardiomegaly: Secondary | ICD-10-CM | POA: Diagnosis not present

## 2017-03-23 DIAGNOSIS — N200 Calculus of kidney: Secondary | ICD-10-CM | POA: Insufficient documentation

## 2017-03-23 DIAGNOSIS — I700 Atherosclerosis of aorta: Secondary | ICD-10-CM | POA: Diagnosis not present

## 2017-03-23 DIAGNOSIS — R911 Solitary pulmonary nodule: Secondary | ICD-10-CM | POA: Diagnosis not present

## 2017-03-23 DIAGNOSIS — J439 Emphysema, unspecified: Secondary | ICD-10-CM | POA: Diagnosis not present

## 2017-03-23 DIAGNOSIS — C3491 Malignant neoplasm of unspecified part of right bronchus or lung: Secondary | ICD-10-CM | POA: Diagnosis not present

## 2017-03-23 MED ORDER — IOPAMIDOL (ISOVUE-300) INJECTION 61%
INTRAVENOUS | Status: AC
  Filled 2017-03-23: qty 75

## 2017-03-23 MED ORDER — IOPAMIDOL (ISOVUE-300) INJECTION 61%
75.0000 mL | Freq: Once | INTRAVENOUS | Status: AC | PRN
  Administered 2017-03-23: 75 mL via INTRAVENOUS

## 2017-03-29 ENCOUNTER — Ambulatory Visit (HOSPITAL_BASED_OUTPATIENT_CLINIC_OR_DEPARTMENT_OTHER): Payer: Medicare Other | Admitting: Internal Medicine

## 2017-03-29 ENCOUNTER — Telehealth: Payer: Self-pay | Admitting: Internal Medicine

## 2017-03-29 ENCOUNTER — Other Ambulatory Visit (HOSPITAL_BASED_OUTPATIENT_CLINIC_OR_DEPARTMENT_OTHER): Payer: Medicare Other

## 2017-03-29 ENCOUNTER — Ambulatory Visit (HOSPITAL_BASED_OUTPATIENT_CLINIC_OR_DEPARTMENT_OTHER): Payer: Medicare Other

## 2017-03-29 ENCOUNTER — Encounter: Payer: Self-pay | Admitting: Internal Medicine

## 2017-03-29 VITALS — BP 148/89 | HR 70 | Temp 97.8°F | Resp 20 | Ht 73.0 in | Wt 296.3 lb

## 2017-03-29 DIAGNOSIS — C3411 Malignant neoplasm of upper lobe, right bronchus or lung: Secondary | ICD-10-CM

## 2017-03-29 DIAGNOSIS — I10 Essential (primary) hypertension: Secondary | ICD-10-CM | POA: Diagnosis not present

## 2017-03-29 DIAGNOSIS — Z5112 Encounter for antineoplastic immunotherapy: Secondary | ICD-10-CM | POA: Diagnosis not present

## 2017-03-29 DIAGNOSIS — Z79899 Other long term (current) drug therapy: Secondary | ICD-10-CM

## 2017-03-29 DIAGNOSIS — R5382 Chronic fatigue, unspecified: Secondary | ICD-10-CM

## 2017-03-29 LAB — CBC WITH DIFFERENTIAL/PLATELET
BASO%: 0.3 % (ref 0.0–2.0)
Basophils Absolute: 0 10*3/uL (ref 0.0–0.1)
EOS%: 1.9 % (ref 0.0–7.0)
Eosinophils Absolute: 0.1 10*3/uL (ref 0.0–0.5)
HEMATOCRIT: 36.8 % — AB (ref 38.4–49.9)
HEMOGLOBIN: 11.8 g/dL — AB (ref 13.0–17.1)
LYMPH#: 0.6 10*3/uL — AB (ref 0.9–3.3)
LYMPH%: 15.8 % (ref 14.0–49.0)
MCH: 28.9 pg (ref 27.2–33.4)
MCHC: 32.1 g/dL (ref 32.0–36.0)
MCV: 90.2 fL (ref 79.3–98.0)
MONO#: 0.8 10*3/uL (ref 0.1–0.9)
MONO%: 20.8 % — ABNORMAL HIGH (ref 0.0–14.0)
NEUT%: 61.2 % (ref 39.0–75.0)
NEUTROS ABS: 2.2 10*3/uL (ref 1.5–6.5)
NRBC: 0 % (ref 0–0)
Platelets: 135 10*3/uL — ABNORMAL LOW (ref 140–400)
RBC: 4.08 10*6/uL — ABNORMAL LOW (ref 4.20–5.82)
RDW: 16.6 % — ABNORMAL HIGH (ref 11.0–14.6)
WBC: 3.6 10*3/uL — AB (ref 4.0–10.3)

## 2017-03-29 LAB — COMPREHENSIVE METABOLIC PANEL
ALBUMIN: 3.7 g/dL (ref 3.5–5.0)
ALK PHOS: 105 U/L (ref 40–150)
ALT: 13 U/L (ref 0–55)
ANION GAP: 7 meq/L (ref 3–11)
AST: 18 U/L (ref 5–34)
BILIRUBIN TOTAL: 0.62 mg/dL (ref 0.20–1.20)
BUN: 28.6 mg/dL — ABNORMAL HIGH (ref 7.0–26.0)
CALCIUM: 9.3 mg/dL (ref 8.4–10.4)
CO2: 27 mEq/L (ref 22–29)
Chloride: 107 mEq/L (ref 98–109)
Creatinine: 1.6 mg/dL — ABNORMAL HIGH (ref 0.7–1.3)
EGFR: 51 mL/min/{1.73_m2} — AB (ref >60)
GLUCOSE: 90 mg/dL (ref 70–140)
POTASSIUM: 4 meq/L (ref 3.5–5.1)
SODIUM: 141 meq/L (ref 136–145)
TOTAL PROTEIN: 7.2 g/dL (ref 6.4–8.3)

## 2017-03-29 LAB — TSH: TSH: 0.548 m[IU]/L (ref 0.320–4.118)

## 2017-03-29 MED ORDER — HEPARIN SOD (PORK) LOCK FLUSH 100 UNIT/ML IV SOLN
500.0000 [IU] | Freq: Once | INTRAVENOUS | Status: AC | PRN
  Administered 2017-03-29: 500 [IU]
  Filled 2017-03-29: qty 5

## 2017-03-29 MED ORDER — SODIUM CHLORIDE 0.9 % IV SOLN
Freq: Once | INTRAVENOUS | Status: AC
  Administered 2017-03-29: 13:00:00 via INTRAVENOUS

## 2017-03-29 MED ORDER — SODIUM CHLORIDE 0.9 % IV SOLN
1240.0000 mg | Freq: Once | INTRAVENOUS | Status: AC
  Administered 2017-03-29: 1240 mg via INTRAVENOUS
  Filled 2017-03-29: qty 20

## 2017-03-29 MED ORDER — SODIUM CHLORIDE 0.9% FLUSH
10.0000 mL | INTRAVENOUS | Status: DC | PRN
  Administered 2017-03-29: 10 mL
  Filled 2017-03-29: qty 10

## 2017-03-29 NOTE — Patient Instructions (Signed)
[place] Discharge Instructions for Patients Receiving Chemotherapy  Today you received the following chemotherapy agents: Durvalumab   To help prevent nausea and vomiting after your treatment, we encourage you to take your nausea medication as directed.    If you develop nausea and vomiting that is not controlled by your nausea medication, call the clinic.   BELOW ARE SYMPTOMS THAT SHOULD BE REPORTED IMMEDIATELY:  *FEVER GREATER THAN 100.5 F  *CHILLS WITH OR WITHOUT FEVER  NAUSEA AND VOMITING THAT IS NOT CONTROLLED WITH YOUR NAUSEA MEDICATION  *UNUSUAL SHORTNESS OF BREATH  *UNUSUAL BRUISING OR BLEEDING  TENDERNESS IN MOUTH AND THROAT WITH OR WITHOUT PRESENCE OF ULCERS  *URINARY PROBLEMS  *BOWEL PROBLEMS  UNUSUAL RASH Items with * indicate a potential emergency and should be followed up as soon as possible.  Feel free to call the clinic should you have any questions or concerns. The clinic phone number is [phone].  Please show the [place] at check-in to the Emergency Department and triage nurse.

## 2017-03-29 NOTE — Progress Notes (Signed)
[place] Telephone:(336) [phone]   Fax:(336) [phone]  OFFICE PROGRESS NOTE  Lucianne Lei, MD [address] [place] [address]  DIAGNOSIS: Stage IIIA (T1a, N2, M0) non-small cell lung cancer, adenocarcinoma presented with right upper lobe lung nodule in addition to mediastinal lymphadenopathy diagnosed in March 2018.  Biomarker Findings Tumor Mutational Burden - TMB-Intermediate (8 Muts/Mb) Microsatellite Status - MS-Stable Genomic Findings For a complete list of the genes assayed, please refer to the Appendix. ERBB2 amplification - equivocal? DNMT3A K475f*192 FUBP1 Q40* KEAP1 [id number] TP53 C275F 7 Disease relevant genes with no reportable alterations: EGFR, KRAS, ALK, BRAF, MET, RET, ROS1   PRIOR THERAPY: course of concurrent chemoradiation with weekly carboplatin for AUC of 2 and paclitaxel 45 MG/M2. Status post 6 cycles. Last cycle was given 10/16/2016.  CURRENT THERAPY:  Consolidation treatment with immunotherapy with Imfinzi (Durvalumab) 10 MG/M2 every 2 weeks. First dose 12/21/2016.  Status post 6 cycles.  INTERVAL HISTORY: Jason Philipp89.o. male returns to the clinic today for follow-up visit accompanied by his daughter.  The patient has no complaints today.  He continues to tolerate his consolidation immunotherapy with Imfinzi (Durvalumab) fairly well.  He denied having any current chest pain, shortness of breath, cough or hemoptysis.  He denied having any weight loss or night sweats.  He has no nausea, vomiting, diarrhea or constipation.  He had repeat CT scan of the chest performed recently and he is here for evaluation and discussion of his discuss results.  MEDICAL HISTORY: Past Medical History:  Diagnosis Date  . Adenocarcinoma of right lung, stage 3 (HCRhodes5/23/2018  . Adenocarcinoma of right lung, stage 3 (HCUalapue5/23/2018  . Anemia   . Arthritis    "hx right hip"  . Asthma    "when I was a child"  . Atrial fibrillation  (HCC)    Amiodarone started 10/2011; Coumadin  . 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 St. Jude ICD implantation 10/03/12.  . Chronic anticoagulation   . Chronic fatigue 10/18/2016  . Chronic fatigue 10/18/2016  . Chronic systolic heart failure (HCBelpre   a. Echo 7/13: EF 25%;  b. echo 04/2012:  Mild LVH, EF 30-35%, Gr 1 DD, mild AI, mild MR, mild LAE  . COPD (chronic obstructive pulmonary disease) (HCMaywood  . Dyslipidemia   . Dysrhythmia   . Encounter for antineoplastic chemotherapy 08/24/2016  . Gout   . History of blood transfusion 10/15/2013   "don't know where the blood's going; HgB down to 5"  . Hyperlipidemia   . Hypertension   . Hypothyroidism   . NICM (nonischemic cardiomyopathy) (HCFoster [facility]   [place]HW[place]/14:  minimal CAD  . Obesity   . OSA on CPAP   . Tobacco abuse     ALLERGIES:  has No Known Allergies.  MEDICATIONS:  Current Outpatient Medications  Medication Sig Dispense Refill  . Acetaminophen (TYLENOL) 325 MG CAPS Take 325 mg by mouth daily as needed.    . Marland Kitchencetaminophen (TYLENOL) 500 MG tablet Take 1,000 mg by mouth every 6 (six) hours as needed for mild pain.    . Marland Kitchenlbuterol (PROVENTIL HFA;VENTOLIN HFA) 108 (90 Base) MCG/ACT inhaler Inhale 2 puffs into the lungs every 4 (four) hours as needed for wheezing or shortness of breath. 1 Inhaler 2  . albuterol (PROVENTIL HFA;VENTOLIN HFA) 108 (90 Base) MCG/ACT inhaler Inhale 2 puffs into the lungs every 6 (six) hours as needed.    .[name]  amiodarone (PACERONE) 100 MG tablet TAKE 1 TABLET BY MOUTH DAILY 90 tablet 1  . atorvastatin (LIPITOR) 20 MG tablet TAKE 1 TABLET BY MOUTH EVERY DAY 90 tablet 3  . carvedilol (COREG) 25 MG tablet TAKE 1 TABLET BY MOUTH TWICE A DAY WITH A MEAL 60 tablet 11  . CVS D3 2000 units CAPS Take 2,000 Units by mouth daily.   11  . feeding supplement, ENSURE ENLIVE, (ENSURE ENLIVE) LIQD Take 237 mLs by mouth 2 (two) times daily between meals. 237 mL 12  . ferrous gluconate (FERGON) 324 MG  tablet Take 1 tablet (324 mg total) by mouth 3 (three) times daily with meals. 90 tablet 8  . Fluticasone-Umeclidin-Vilant (TRELEGY ELLIPTA) 100-62.5-25 MCG/INH AEPB Inhale 1 puff into the lungs daily. 1 each 6  . furosemide (LASIX) 20 MG tablet Take 3 tabs (60 mg) every Tue and Sat, all other days take 2 tabs (40 mg) (Patient taking differently: Take 40-60 mg by mouth See admin instructions. Take 3 tabs (60 mg) every Tue and Sat, all other days take 2 tabs (40 mg)) 64 tablet 6  . hydrALAZINE (APRESOLINE) 100 MG tablet Take 100 mg by mouth 3 (three) times daily.   1  . levothyroxine (SYNTHROID, LEVOTHROID) 50 MCG tablet TAKE 1 TABLET (50 MCG TOTAL) BY MOUTH DAILY BEFORE BREAKFAST. 90 tablet 3  . lidocaine-prilocaine (EMLA) cream Apply generous amount to port site at least 1 hr prior to treatment.  DO NOT RUB IN. 30 g 1  . lisinopril (PRINIVIL,ZESTRIL) 40 MG tablet TAKE 1 TABLET BY MOUTH DAILY. 90 tablet 2  . magnesium oxide (MAG-OX) 400 (241.3 Mg) MG tablet Take 1 tablet (400 mg total) by mouth daily. 30 tablet 0  . potassium chloride SA (KLOR-CON M20) 20 MEQ tablet Take 2 tablets (40 mEq total) by mouth daily. 60 tablet 0  . prochlorperazine (COMPAZINE) 10 MG tablet Take 1 tablet (10 mg total) by mouth every 6 (six) hours as needed for nausea or vomiting. 30 tablet 0  . rivaroxaban (XARELTO) 20 MG TABS tablet Take 1 tablet (20 mg total) by mouth daily. 90 tablet 2  . sildenafil (VIAGRA) 100 MG tablet Take 100 mg by mouth.    . spironolactone (ALDACTONE) 25 MG tablet TAKE 0.5 TABLETS (12.5 MG TOTAL) BY MOUTH DAILY. 45 tablet 2  . sucralfate (CARAFATE) 1 g tablet TAKE 1 TABLET BY MOUTH FOUR TIMES DAILY (5 MINUTES BEFORE MEALS AND AT BEDTIME) FOR ESOPHAGITIS 120 tablet 2  . ULORIC 40 MG tablet Take 1 tablet (40 mg total) by mouth daily. 30 tablet 0  . Vitamin D, Ergocalciferol, (DRISDOL) 50000 units CAPS capsule Take 50,000 Units by mouth daily.     No current facility-administered medications for  this visit.     SURGICAL HISTORY:  Past Surgical History:  Procedure Laterality Date  . CARDIAC DEFIBRILLATOR PLACEMENT  2014  . CARDIOVERSION  2011  . COLONOSCOPY WITH PROPOFOL Left 10/17/2013   Procedure: COLONOSCOPY WITH PROPOFOL;  Surgeon: Inda Castle, MD;  Location: [facility];  Service: Endoscopy;  Laterality: Left;  . ESOPHAGOGASTRODUODENOSCOPY N/A 10/17/2013   Procedure: ESOPHAGOGASTRODUODENOSCOPY (EGD);  Surgeon: Inda Castle, MD;  Location: [facility];  Service: Endoscopy;  Laterality: N/A;  . GIVENS CAPSULE STUDY N/A 10/29/2013   Procedure: GIVENS CAPSULE STUDY;  Surgeon: Inda Castle, MD;  Location: [facility];  Service: Endoscopy;  Laterality: N/A;  . IMPLANTABLE CARDIOVERTER DEFIBRILLATOR IMPLANT Left 10/03/2012   Procedure: IMPLANTABLE CARDIOVERTER DEFIBRILLATOR IMPLANT;  Surgeon: Revonda Standard  Caryl Comes, MD;  Location: [facility] CATH LAB;  Service: Cardiovascular;  Laterality: Left;  . IR FLUORO GUIDE PORT INSERTION RIGHT  09/21/2016  . IR US GUIDE VASC ACCESS RIGHT  09/21/2016  . JOINT REPLACEMENT    . TONSILLECTOMY  1950's  . TOTAL HIP ARTHROPLASTY Right 11/25/1997  . VIDEO BRONCHOSCOPY WITH ENDOBRONCHIAL ULTRASOUND N/A 08/09/2016   Procedure: VIDEO BRONCHOSCOPY WITH ENDOBRONCHIAL ULTRASOUND;  Surgeon: Javier Glazier, MD;  Location: [facility] OR;  Service: Thoracic;  Laterality: N/A;    REVIEW OF SYSTEMS:  Constitutional: negative Eyes: negative Ears, nose, mouth, throat, and face: negative Respiratory: negative Cardiovascular: negative Gastrointestinal: negative Genitourinary:negative Integument/breast: negative Hematologic/lymphatic: negative Musculoskeletal:negative Neurological: negative Behavioral/Psych: negative Endocrine: negative Allergic/Immunologic: negative   PHYSICAL EXAMINATION: General appearance: alert, cooperative and no distress Head: Normocephalic, without obvious abnormality, atraumatic Neck: no adenopathy, no JVD, supple, symmetrical, trachea midline  and thyroid not enlarged, symmetric, no tenderness/mass/nodules Lymph nodes: Cervical, supraclavicular, and axillary nodes normal. Resp: clear to auscultation bilaterally Back: symmetric, no curvature. ROM normal. No CVA tenderness. Cardio: regular rate and rhythm, S1, S2 normal, no murmur, click, rub or gallop GI: soft, non-tender; bowel sounds normal; no masses,  no organomegaly Extremities: extremities normal, atraumatic, no cyanosis or edema Neurologic: Alert and oriented X 3, normal strength and tone. Normal symmetric reflexes. Normal coordination and gait  ECOG PERFORMANCE STATUS: 1 - Symptomatic but completely ambulatory  Blood pressure (!) 148/89, pulse 70, temperature 97.8 F (36.6 C), temperature source Oral, resp. rate 20, height '6\' 1"'  (1.854 m), weight 296 lb 4.8 oz (134.4 kg), SpO2 99 %.  LABORATORY DATA: Lab Results  Component Value Date   WBC 3.6 (L) 03/29/2017   HGB 11.8 (L) 03/29/2017   HCT 36.8 (L) 03/29/2017   MCV 90.2 03/29/2017   PLT 135 (L) 03/29/2017      Chemistry      Component Value Date/Time   NA 141 03/29/2017 1038   K 4.0 03/29/2017 1038   CL 104 01/15/2017 0854   CO2 27 03/29/2017 1038   BUN 28.6 (H) 03/29/2017 1038   CREATININE 1.6 (H) 03/29/2017 1038      Component Value Date/Time   CALCIUM 9.3 03/29/2017 1038   ALKPHOS 105 03/29/2017 1038   AST 18 03/29/2017 1038   ALT 13 03/29/2017 1038   BILITOT 0.62 03/29/2017 1038       RADIOGRAPHIC STUDIES: Ct Chest W Contrast  Result Date: 03/24/2017 CLINICAL DATA:  Right lung cancer, chemotherapy and radiation therapy completed in June 2018. Ongoing immunotherapy. EXAM: CT CHEST WITH CONTRAST TECHNIQUE: Multidetector CT imaging of the chest was performed during intravenous contrast administration. CONTRAST:  13m ISOVUE-300 IOPAMIDOL (ISOVUE-300) INJECTION 61% COMPARISON:  01/08/2017 FINDINGS: Cardiovascular: AICD lead noted. Right-sided port terminates in the SVC. Atherosclerotic calcification  of the aorta and branch vessels. Mild left ventricular enlargement. Mediastinum/Nodes: Indistinctness along the lower margins of the sternocleidomastoid muscles primarily due to streak artifact from the patient's metal necklace. No recurrent subcarinal, paratracheal, or other adenopathy. Lungs/Pleura: Increased right paramediastinal airspace opacity and volume loss favoring radiation fibrosis. This obscures the region of the prior apical right upper lobe nodule Paraseptal emphysema. The peripheral airspace opacities in the right upper lobe shown on the prior exam have resolved. Upper Abdomen: Hypodense lesion of the left kidney upper pole, nonspecific. Right nephrolithiasis. Low-density fullness of the left adrenal gland. Musculoskeletal: Unremarkable IMPRESSION: 1. 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 This obscures the regional right upper lobe apical lung nodule. No obvious nodular growth or recurrent  thoracic adenopathy. 2. Aortic Atherosclerosis (ICD10-I70.0) and Emphysema (ICD10-J43.9). Mild left ventricular enlargement. 3. Right nephrolithiasis. Electronically Signed   By: Van Clines M.D.   On: 03/24/2017 16:10    ASSESSMENT AND PLAN:  This is a very pleasant 66 years old African-American male with a stage IIIa non-small cell lung cancer, adenocarcinoma.  The patient completed 6 weeks of concurrent chemoradiation with weekly carboplatin and paclitaxel and tolerated his treatment well except for odynophagia. The patient is currently on consolidation treatment with immunotherapy with Imfinzi (Durvalumab) status post 6 cycles.  The patient continues to tolerate this treatment fairly well. He had repeat CT scan of the chest performed recently. I personally and independently reviewed the scans and discussed the results with the patient and his daughter today. Has a scan showed no clear evidence for disease progression but he continues to have progressive  paramediastinal radiation fibrosis/pneumonitis. I recommended for the patient to continue his current treatment with consolidation Imfinzi (Durvalumab). I will see him back for follow-up visit in 2 weeks for evaluation with the next cycle of his treatment. The patient was advised to call immediately if he has any concerning symptoms in the interval. The patient voices understanding of current disease status and treatment options and is in agreement with the current care plan. All questions were answered. The patient knows to call the clinic with any problems, questions or concerns. We can certainly see the patient much sooner if necessary.  Disclaimer: This note was dictated with voice recognition software. Similar sounding words can inadvertently be transcribed and may not be corrected upon review.

## 2017-03-29 NOTE — Telephone Encounter (Signed)
Patient declined avs and calendar  °

## 2017-03-29 NOTE — Progress Notes (Signed)
Per Dr. Julien Nordmann okay to proceed with treatment with Creatinine of 1.6.

## 2017-04-05 ENCOUNTER — Ambulatory Visit (INDEPENDENT_AMBULATORY_CARE_PROVIDER_SITE_OTHER): Payer: Medicare Other

## 2017-04-05 DIAGNOSIS — Z95810 Presence of automatic (implantable) cardiac defibrillator: Secondary | ICD-10-CM

## 2017-04-05 DIAGNOSIS — I5022 Chronic systolic (congestive) heart failure: Secondary | ICD-10-CM

## 2017-04-05 NOTE — Progress Notes (Signed)
EPIC Encounter for ICM Monitoring  Patient Name: Jason Russell is a 67 y.o. male Date: 04/05/2017 Primary Care Physican: Lucianne Lei, MD Primary Cardiologist:Ross Electrophysiologist: Faustino Congress Weight: Previous weight 286 lbs      Attempted call to patient and unable to reach. Transmission reviewed.    Thoracic impedance normal.  Prescribed dosage: Furosemide 20 mg 3 tablets (60 mg total) every Tuesday and Saturday and 40 mg all the other days. Potassium 20 mEq 1 tablet every day and on days when lasix is taken, take 2 tablets  Labs: 03/29/2017 Creatinine 1.6,   BUN 28.6, Potassium 4.0, Sodium 141, EGFR 51 03/15/2017 Creatinine 1.7,   BUN 33.6, Potassium 3.7, Sodium 142, EGFR 49  03/01/2017 Creatinine 1.5,   BUN 22.8, Potassium 3.7, Sodium 143, EGFR 55  02/15/2017 Creatinine 1.6,   BUN 20.4, Potassium 3.9, Sodium 142, EGFR 54  02/01/2017 Creatinine 1.4,   BUN 28.3, Potassium 4.0, Sodium 141, EGFR >60  01/18/2017 Creatinine 1.4,   BUN 25.6, Potassium 3.8, Sodium 142, EGFR >60  01/15/2017 Creatinine 1.56, BUN 28,    Potassium 4.1, Sodium 144  01/09/2017 Creatinine 1.58, BUN 19,    Potassium 3.6, Sodium 140  01/08/2017 Creatinine 1.54, BUN 26,    Potassium 3.6, Sodium 139  01/07/2017 Creatinine 1.62, BUN 16,    Potassium 3.5, Sodium 136  01/04/2017 Creatinine 1.9,   BUN 23.5, Potassium 3.6, Sodium 140, EGFR 41 12/21/2016 Creatinine 1.2, BUN 22.8, Potassium 3.7, Sodium 141, EGFR 52 11/10/2016 Creatinine 1.7, BUN 23, Potassium 3.6, Sodium 143, EGFR 49 10/16/2016 Creatinine 2.0, BUN 28.4, Potassium 4.1, Sodium 141, EGFR 40  10/09/2016 Creatinine 1.5, BUN 25.7, Potassium 4.1, Sodium 143, EGFR 55  10/02/2016 Creatinine 1.6, BUN 20.4, Potassium 3.9, Sodium 147, EGFR 53  09/25/2016 Creatinine 1.3, BUN 21, Potassium 4.1, Sodium 142, EGFR 65  09/21/2016 Creatinine 1.50, BUN 29, Potassium 3.7, Sodium 143  09/18/2016 Creatinine 1.9, BUN 25.8, Potassium 3.2, Sodium  144, EGFR 43  09/11/2016 Creatinine 1.6, BUN 32.9, Potassium 3.7, Sodium 142, EGFR 51  09/05/2016 Creatinine 1.6, BUN 19,Potassium 3.3, Sodium 145 08/24/2016 Creatinine 1.6, BUN 22,Potassium 3.4, Sodium 143  Recommendations: NONE - Unable to reach.  Follow-up plan: ICM clinic phone appointment on 05/07/2017.   Copy of ICM check sent to Dr. Caryl Comes.   3 month ICM trend: 04/05/2017    1 Year ICM trend:       [name], RN 04/05/2017 1:12 PM

## 2017-04-06 ENCOUNTER — Other Ambulatory Visit: Payer: Self-pay | Admitting: Internal Medicine

## 2017-04-12 ENCOUNTER — Inpatient Hospital Stay: Payer: Medicare Other | Attending: Internal Medicine | Admitting: Internal Medicine

## 2017-04-12 ENCOUNTER — Inpatient Hospital Stay: Payer: Medicare Other

## 2017-04-12 ENCOUNTER — Encounter: Payer: Self-pay | Admitting: Internal Medicine

## 2017-04-12 VITALS — BP 148/97 | HR 80 | Temp 98.5°F | Resp 18 | Ht 73.0 in | Wt 295.9 lb

## 2017-04-12 DIAGNOSIS — R5382 Chronic fatigue, unspecified: Secondary | ICD-10-CM | POA: Diagnosis not present

## 2017-04-12 DIAGNOSIS — Z9989 Dependence on other enabling machines and devices: Secondary | ICD-10-CM

## 2017-04-12 DIAGNOSIS — C3411 Malignant neoplasm of upper lobe, right bronchus or lung: Secondary | ICD-10-CM

## 2017-04-12 DIAGNOSIS — Z923 Personal history of irradiation: Secondary | ICD-10-CM | POA: Insufficient documentation

## 2017-04-12 DIAGNOSIS — E039 Hypothyroidism, unspecified: Secondary | ICD-10-CM

## 2017-04-12 DIAGNOSIS — E785 Hyperlipidemia, unspecified: Secondary | ICD-10-CM | POA: Diagnosis not present

## 2017-04-12 DIAGNOSIS — Z9221 Personal history of antineoplastic chemotherapy: Secondary | ICD-10-CM

## 2017-04-12 DIAGNOSIS — I10 Essential (primary) hypertension: Secondary | ICD-10-CM | POA: Diagnosis not present

## 2017-04-12 DIAGNOSIS — Z79899 Other long term (current) drug therapy: Secondary | ICD-10-CM | POA: Diagnosis not present

## 2017-04-12 DIAGNOSIS — I4891 Unspecified atrial fibrillation: Secondary | ICD-10-CM

## 2017-04-12 DIAGNOSIS — Z5112 Encounter for antineoplastic immunotherapy: Secondary | ICD-10-CM | POA: Insufficient documentation

## 2017-04-12 DIAGNOSIS — J449 Chronic obstructive pulmonary disease, unspecified: Secondary | ICD-10-CM | POA: Diagnosis not present

## 2017-04-12 DIAGNOSIS — G4733 Obstructive sleep apnea (adult) (pediatric): Secondary | ICD-10-CM | POA: Diagnosis not present

## 2017-04-12 DIAGNOSIS — I5022 Chronic systolic (congestive) heart failure: Secondary | ICD-10-CM

## 2017-04-12 DIAGNOSIS — I110 Hypertensive heart disease with heart failure: Secondary | ICD-10-CM | POA: Insufficient documentation

## 2017-04-12 DIAGNOSIS — R590 Localized enlarged lymph nodes: Secondary | ICD-10-CM | POA: Diagnosis not present

## 2017-04-12 LAB — COMPREHENSIVE METABOLIC PANEL
ALT: 14 U/L (ref 0–55)
ANION GAP: 8 (ref 3–11)
AST: 16 U/L (ref 5–34)
Albumin: 3.5 g/dL (ref 3.5–5.0)
Alkaline Phosphatase: 101 U/L (ref 40–150)
BUN: 37 mg/dL — AB (ref 7–26)
CHLORIDE: 108 mmol/L (ref 98–109)
CO2: 26 mmol/L (ref 22–29)
Calcium: 9.5 mg/dL (ref 8.4–10.4)
Creatinine, Ser: 1.72 mg/dL — ABNORMAL HIGH (ref 0.70–1.30)
GFR calc Af Amer: 46 mL/min — ABNORMAL LOW (ref >60)
GFR, EST NON AFRICAN AMERICAN: 40 mL/min — AB (ref >60)
Glucose, Bld: 103 mg/dL (ref 70–140)
POTASSIUM: 3.6 mmol/L (ref 3.5–5.1)
Sodium: 142 mmol/L (ref 136–145)
TOTAL PROTEIN: 6.9 g/dL (ref 6.4–8.3)
Total Bilirubin: 0.3 mg/dL (ref 0.2–1.2)

## 2017-04-12 LAB — CBC WITH DIFFERENTIAL/PLATELET
BASOS ABS: 0 10*3/uL (ref 0.0–0.1)
Basophils Relative: 1 %
Eosinophils Absolute: 0.1 10*3/uL (ref 0.0–0.5)
Eosinophils Relative: 3 %
HEMATOCRIT: 35.2 % — AB (ref 38.4–49.9)
HEMOGLOBIN: 11.5 g/dL — AB (ref 13.0–17.1)
LYMPHS ABS: 0.6 10*3/uL — AB (ref 0.9–3.3)
LYMPHS PCT: 15 %
MCH: 29.2 pg (ref 27.2–33.4)
MCHC: 32.8 g/dL (ref 32.0–36.0)
MCV: 89 fL (ref 79.3–98.0)
Monocytes Absolute: 0.8 10*3/uL (ref 0.1–0.9)
Monocytes Relative: 21 %
NEUTROS ABS: 2.3 10*3/uL (ref 1.5–6.5)
NEUTROS PCT: 60 %
Platelets: 131 10*3/uL — ABNORMAL LOW (ref 140–400)
RBC: 3.95 MIL/uL — AB (ref 4.20–5.82)
RDW: 16.7 % — ABNORMAL HIGH (ref 11.0–15.6)
WBC: 3.7 10*3/uL — AB (ref 4.0–10.3)

## 2017-04-12 MED ORDER — SODIUM CHLORIDE 0.9 % IV SOLN
Freq: Once | INTRAVENOUS | Status: AC
  Administered 2017-04-12: 09:00:00 via INTRAVENOUS

## 2017-04-12 MED ORDER — HEPARIN SOD (PORK) LOCK FLUSH 100 UNIT/ML IV SOLN
500.0000 [IU] | Freq: Once | INTRAVENOUS | Status: AC | PRN
  Administered 2017-04-12: 500 [IU]
  Filled 2017-04-12: qty 5

## 2017-04-12 MED ORDER — SODIUM CHLORIDE 0.9% FLUSH
10.0000 mL | INTRAVENOUS | Status: DC | PRN
  Administered 2017-04-12: 10 mL
  Filled 2017-04-12: qty 10

## 2017-04-12 MED ORDER — SODIUM CHLORIDE 0.9 % IV SOLN
1240.0000 mg | Freq: Once | INTRAVENOUS | Status: AC
  Administered 2017-04-12: 1240 mg via INTRAVENOUS
  Filled 2017-04-12: qty 20

## 2017-04-12 NOTE — Progress Notes (Signed)
[facility] [phone]   [phone]  OFFICE PROGRESS NOTE  Lucianne Lei, MD [address] [place] [address]  DIAGNOSIS: Stage IIIA (T1a, N2, M0) non-small cell lung cancer, adenocarcinoma presented with right upper lobe lung nodule in addition to mediastinal lymphadenopathy diagnosed in March 2018.  Biomarker Findings Tumor Mutational Burden - TMB-Intermediate (8 Muts/Mb) Microsatellite Status - MS-Stable Genomic Findings For a complete list of the genes assayed, please refer to the Appendix. ERBB2 amplification - equivocal? DNMT3A K433f*192 FUBP1 Q40* KEAP1 [id number] TP53 C275F 7 Disease relevant genes with no reportable alterations: EGFR, KRAS, ALK, BRAF, MET, RET, ROS1   PRIOR THERAPY: course of concurrent chemoradiation with weekly carboplatin for AUC of 2 and paclitaxel 45 MG/M2. Status post 6 cycles. Last cycle was given 10/16/2016.  CURRENT THERAPY:  Consolidation treatment with immunotherapy with Imfinzi (Durvalumab) 10 MG/M2 every 2 weeks. First dose 12/21/2016.  Status post 7 cycles.  INTERVAL HISTORY: Jason Serio89.o. male returns to the clinic today for follow-up visit.  The patient is feeling fine today with no specific complaints.  He continues to tolerate his immunotherapy fairly well.  He denied having any chest pain, shortness breath, cough or hemoptysis.  He denied having any weight loss or night sweats.  He has no nausea, vomiting, diarrhea or constipation.  He is here today for evaluation before starting cycle #8 of his treatment with Imfinzi (Durvalumab).  MEDICAL HISTORY: Past Medical History:  Diagnosis Date  . Adenocarcinoma of right lung, stage 3 ([facility]/23/2018  . Adenocarcinoma of right lung, stage 3 ([facility]/23/2018  . Anemia   . Arthritis    "hx right hip"  . Asthma    "when I was a child"  . Atrial fibrillation (HCC)    Amiodarone started 10/2011; Coumadin  . Automatic implantable  cardioverter-defibrillator in situ 10/03/2012   a. St. Jude ICD implantation 10/03/12.  . Chronic anticoagulation   . Chronic fatigue 10/18/2016  . Chronic fatigue 10/18/2016  . Chronic systolic heart failure ([facility]   a. Echo 7/13: EF 25%;  b. echo 04/2012:  Mild LVH, EF 30-35%, Gr 1 DD, mild AI, mild MR, mild LAE  . COPD (chronic obstructive pulmonary disease) ([facility]  . Dyslipidemia   . Dysrhythmia   . Encounter for antineoplastic chemotherapy 08/24/2016  . Gout   . History of blood transfusion 10/15/2013   "don't know where the blood's going; HgB down to 5"  . Hyperlipidemia   . Hypertension   . Hypothyroidism   . NICM (nonischemic cardiomyopathy) ([facility]   [facility]/14:  minimal CAD  . Obesity   . OSA on CPAP   . Tobacco abuse     ALLERGIES:  has No Known Allergies.  MEDICATIONS:  Current Outpatient Medications  Medication Sig Dispense Refill  . Acetaminophen (TYLENOL) 325 MG CAPS Take 325 mg by mouth daily as needed.    . Marland Kitchencetaminophen (TYLENOL) 500 MG tablet Take 1,000 mg by mouth every 6 (six) hours as needed for mild pain.    . Marland Kitchenlbuterol (PROVENTIL HFA;VENTOLIN HFA) 108 (90 Base) MCG/ACT inhaler Inhale 2 puffs into the lungs every 4 (four) hours as needed for wheezing or shortness of breath. 1 Inhaler 2  . albuterol (PROVENTIL HFA;VENTOLIN HFA) 108 (90 Base) MCG/ACT inhaler Inhale 2 puffs into the lungs every 6 (six) hours as needed.    . Marland Kitchenmiodarone (PACERONE) 100 MG tablet TAKE 1 TABLET BY MOUTH DAILY 90  tablet 1  . atorvastatin (LIPITOR) 20 MG tablet TAKE 1 TABLET BY MOUTH EVERY DAY 90 tablet 3  . carvedilol (COREG) 25 MG tablet TAKE 1 TABLET BY MOUTH TWICE A DAY WITH A MEAL 60 tablet 11  . [employer] D3 2000 units CAPS Take 2,000 Units by mouth daily.   11  . feeding supplement, ENSURE ENLIVE, (ENSURE ENLIVE) LIQD Take 237 mLs by mouth 2 (two) times daily between meals. 237 mL 12  . ferrous gluconate (FERGON) 324 MG tablet Take 1 tablet (324 mg total) by mouth 3 (three) times daily with  meals. 90 tablet 8  . Fluticasone-Umeclidin-Vilant (TRELEGY ELLIPTA) 100-62.5-25 MCG/INH AEPB Inhale 1 puff into the lungs daily. 1 each 6  . furosemide (LASIX) 20 MG tablet Take 3 tabs (60 mg) every Tue and Sat, all other days take 2 tabs (40 mg) (Patient taking differently: Take 40-60 mg by mouth See admin instructions. Take 3 tabs (60 mg) every Tue and Sat, all other days take 2 tabs (40 mg)) 64 tablet 6  . hydrALAZINE (APRESOLINE) 100 MG tablet Take 100 mg by mouth 3 (three) times daily.   1  . levothyroxine (SYNTHROID, LEVOTHROID) 50 MCG tablet TAKE 1 TABLET (50 MCG TOTAL) BY MOUTH DAILY BEFORE BREAKFAST. 90 tablet 3  . lidocaine-prilocaine (EMLA) cream Apply generous amount to port site at least 1 hr prior to treatment.  DO NOT RUB IN. 30 g 1  . lisinopril (PRINIVIL,ZESTRIL) 40 MG tablet TAKE 1 TABLET BY MOUTH DAILY. 90 tablet 2  . magnesium oxide (MAG-OX) 400 (241.3 Mg) MG tablet Take 1 tablet (400 mg total) by mouth daily. 30 tablet 0  . potassium chloride SA (KLOR-CON M20) 20 MEQ tablet TAKE 1 TABLET BY MOUTH EVERY DAY *ON DAYS WHEN LASIX IS TAKEN, TAKE 2 TABLETS. Please make appt with Dr. Harrington Challenger for February. 1st attempt 45 tablet 0  . prochlorperazine (COMPAZINE) 10 MG tablet Take 1 tablet (10 mg total) by mouth every 6 (six) hours as needed for nausea or vomiting. 30 tablet 0  . rivaroxaban (XARELTO) 20 MG TABS tablet Take 1 tablet (20 mg total) by mouth daily. 90 tablet 2  . sildenafil (VIAGRA) 100 MG tablet Take 100 mg by mouth.    . spironolactone (ALDACTONE) 25 MG tablet TAKE 0.5 TABLETS (12.5 MG TOTAL) BY MOUTH DAILY. 45 tablet 2  . sucralfate (CARAFATE) 1 g tablet TAKE 1 TABLET BY MOUTH FOUR TIMES DAILY (5 MINUTES BEFORE MEALS AND AT BEDTIME) FOR ESOPHAGITIS 120 tablet 2  . ULORIC 40 MG tablet Take 1 tablet (40 mg total) by mouth daily. 30 tablet 0  . Vitamin D, Ergocalciferol, (DRISDOL) 50000 units CAPS capsule Take 50,000 Units by mouth daily.     No current facility-administered  medications for this visit.     SURGICAL HISTORY:  Past Surgical History:  Procedure Laterality Date  . CARDIAC DEFIBRILLATOR PLACEMENT  2014  . CARDIOVERSION  2011  . COLONOSCOPY WITH PROPOFOL Left 10/17/2013   Procedure: COLONOSCOPY WITH PROPOFOL;  Surgeon: Inda Castle, MD;  Location: [facility];  Service: Endoscopy;  Laterality: Left;  . ESOPHAGOGASTRODUODENOSCOPY N/A 10/17/2013   Procedure: ESOPHAGOGASTRODUODENOSCOPY (EGD);  Surgeon: Inda Castle, MD;  Location: [facility];  Service: Endoscopy;  Laterality: N/A;  . GIVENS CAPSULE STUDY N/A 10/29/2013   Procedure: GIVENS CAPSULE STUDY;  Surgeon: Inda Castle, MD;  Location: [facility];  Service: Endoscopy;  Laterality: N/A;  . IMPLANTABLE CARDIOVERTER DEFIBRILLATOR IMPLANT Left 10/03/2012   Procedure: IMPLANTABLE CARDIOVERTER DEFIBRILLATOR  IMPLANT;  Surgeon: Deboraha Sprang, MD;  Location[facility] CATH LAB;  Service: Cardiovascular;  Laterality: Left;  . IR FLUORO GUIDE PORT INSERTION RIGHT  09/21/2016  . IR US GUIDE VASC ACCESS RIGHT  09/21/2016  . JOINT REPLACEMENT    . TONSILLECTOMY  1950's  . TOTAL HIP ARTHROPLASTY Right 11/25/1997  . VIDEO BRONCHOSCOPY WITH ENDOBRONCHIAL ULTRASOUND N/A 08/09/2016   Procedure: VIDEO BRONCHOSCOPY WITH ENDOBRONCHIAL ULTRASOUND;  Surgeon: Javier Glazier, MD;  Location: [name] OR;  Service: Thoracic;  Laterality: N/A;    REVIEW OF SYSTEMS:  A comprehensive review of systems was negative.   PHYSICAL EXAMINATION: General appearance: alert, cooperative and no distress Head: Normocephalic, without obvious abnormality, atraumatic Neck: no adenopathy, no JVD, supple, symmetrical, trachea midline and thyroid not enlarged, symmetric, no tenderness/mass/nodules Lymph nodes: Cervical, supraclavicular, and axillary nodes normal. Resp: clear to auscultation bilaterally Back: symmetric, no curvature. ROM normal. No CVA tenderness. Cardio: regular rate and rhythm, S1, S2 normal, no murmur, click, rub or  gallop GI: soft, non-tender; bowel sounds normal; no masses,  no organomegaly Extremities: extremities normal, atraumatic, no cyanosis or edema  ECOG PERFORMANCE STATUS: 1 - Symptomatic but completely ambulatory  Blood pressure (!) 148/97, pulse 80, temperature 98.5 F (36.9 C), temperature source Oral, resp. rate 18, height '6\' 1"'  (1.854 m), weight 295 lb 14.4 oz (134.2 kg), SpO2 99 %.  LABORATORY DATA: Lab Results  Component Value Date   WBC 3.7 (L) 04/12/2017   HGB 11.5 (L) 04/12/2017   HCT 35.2 (L) 04/12/2017   MCV 89.0 04/12/2017   PLT 131 (L) 04/12/2017      Chemistry      Component Value Date/Time   NA 141 03/29/2017 1038   K 4.0 03/29/2017 1038   CL 104 01/15/2017 0854   CO2 27 03/29/2017 1038   BUN 28.6 (H) 03/29/2017 1038   CREATININE 1.6 (H) 03/29/2017 1038      Component Value Date/Time   CALCIUM 9.3 03/29/2017 1038   ALKPHOS 105 03/29/2017 1038   AST 18 03/29/2017 1038   ALT 13 03/29/2017 1038   BILITOT 0.62 03/29/2017 1038       RADIOGRAPHIC STUDIES: Ct Chest W Contrast  Result Date: 03/24/2017 CLINICAL DATA:  Right lung cancer, chemotherapy and radiation therapy completed in June 2018. Ongoing immunotherapy. EXAM: CT CHEST WITH CONTRAST TECHNIQUE: Multidetector CT imaging of the chest was performed during intravenous contrast administration. CONTRAST:  44m ISOVUE-300 IOPAMIDOL (ISOVUE-300) INJECTION 61% COMPARISON:  01/08/2017 FINDINGS: Cardiovascular: AICD lead noted. Right-sided port terminates in the SVC. Atherosclerotic calcification of the aorta and branch vessels. Mild left ventricular enlargement. Mediastinum/Nodes: Indistinctness along the lower margins of the sternocleidomastoid muscles primarily due to streak artifact from the patient's metal necklace. No recurrent subcarinal, paratracheal, or other adenopathy. Lungs/Pleura: Increased right paramediastinal airspace opacity and volume loss favoring radiation fibrosis. This obscures the region of the  prior apical right upper lobe nodule Paraseptal emphysema. The peripheral airspace opacities in the right upper lobe shown on the prior exam have resolved. Upper Abdomen: Hypodense lesion of the left kidney upper pole, nonspecific. Right nephrolithiasis. Low-density fullness of the left adrenal gland. Musculoskeletal: Unremarkable IMPRESSION: 1. 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 This obscures the regional right upper lobe apical lung nodule. No obvious nodular growth or recurrent thoracic adenopathy. 2. Aortic Atherosclerosis (ICD10-I70.0) and Emphysema (ICD10-J43.9). Mild left ventricular enlargement. 3. Right nephrolithiasis. Electronically Signed   By: WVan ClinesM.D.   On: 03/24/2017 16:10    ASSESSMENT AND PLAN:  This is a very pleasant 67 years old African-American male with a stage IIIa non-small cell lung cancer, adenocarcinoma.  The patient completed 6 weeks of concurrent chemoradiation with weekly carboplatin and paclitaxel and tolerated his treatment well except for odynophagia. The patient is currently on consolidation treatment with immunotherapy with Imfinzi (Durvalumab) status post 7 cycles.  The patient tolerated the last cycle of his treatment well with no concerning complaints. I recommended for him to proceed with cycle #8 today as a scheduled. For hypertension, I strongly advised him to take his blood pressure medication as prescribed and to consult with his primary care physician for adjustment of medications if needed. The patient was advised to call immediately if he has any concerning symptoms in the interval. The patient voices understanding of current disease status and treatment options and is in agreement with the current care plan. All questions were answered. The patient knows to call the clinic with any problems, questions or concerns. We can certainly see the patient much sooner if necessary.  Disclaimer: This note was  dictated with voice recognition software. Similar sounding words can inadvertently be transcribed and may not be corrected upon review.

## 2017-04-12 NOTE — Patient Instructions (Signed)
[facility] Discharge Instructions for Patients Receiving Chemotherapy  Today you received the following chemotherapy agents: Imfinzi.  To help prevent nausea and vomiting after your treatment, we encourage you to take your nausea medication as directed.   If you develop nausea and vomiting that is not controlled by your nausea medication, call the clinic.   BELOW ARE SYMPTOMS THAT SHOULD BE REPORTED IMMEDIATELY:  *FEVER GREATER THAN 100.5 F  *CHILLS WITH OR WITHOUT FEVER  NAUSEA AND VOMITING THAT IS NOT CONTROLLED WITH YOUR NAUSEA MEDICATION  *UNUSUAL SHORTNESS OF BREATH  *UNUSUAL BRUISING OR BLEEDING  TENDERNESS IN MOUTH AND THROAT WITH OR WITHOUT PRESENCE OF ULCERS  *URINARY PROBLEMS  *BOWEL PROBLEMS  UNUSUAL RASH Items with * indicate a potential emergency and should be followed up as soon as possible.  Feel free to call the clinic should you have any questions or concerns. The clinic phone number is [phone].  Please show the CHEMO ALERT CARD at check-in to the Emergency Department and triage nurse.   

## 2017-04-12 NOTE — Progress Notes (Signed)
Ok to treat with today's resulted labs per Dr. Julien Nordmann.

## 2017-04-16 ENCOUNTER — Telehealth: Payer: Self-pay | Admitting: Internal Medicine

## 2017-04-16 NOTE — Telephone Encounter (Signed)
Scheduled appt per 1/10 los - Pt to get an updated schedule next visit.

## 2017-04-26 ENCOUNTER — Inpatient Hospital Stay: Payer: Medicare Other

## 2017-04-26 ENCOUNTER — Inpatient Hospital Stay (HOSPITAL_BASED_OUTPATIENT_CLINIC_OR_DEPARTMENT_OTHER): Payer: Medicare Other | Admitting: Internal Medicine

## 2017-04-26 ENCOUNTER — Telehealth: Payer: Self-pay | Admitting: Internal Medicine

## 2017-04-26 ENCOUNTER — Encounter: Payer: Self-pay | Admitting: Internal Medicine

## 2017-04-26 VITALS — BP 152/78 | HR 75 | Temp 97.7°F | Resp 18 | Ht 73.0 in | Wt 292.7 lb

## 2017-04-26 DIAGNOSIS — Z5112 Encounter for antineoplastic immunotherapy: Secondary | ICD-10-CM | POA: Diagnosis not present

## 2017-04-26 DIAGNOSIS — R5382 Chronic fatigue, unspecified: Secondary | ICD-10-CM

## 2017-04-26 DIAGNOSIS — E039 Hypothyroidism, unspecified: Secondary | ICD-10-CM | POA: Diagnosis not present

## 2017-04-26 DIAGNOSIS — I5022 Chronic systolic (congestive) heart failure: Secondary | ICD-10-CM

## 2017-04-26 DIAGNOSIS — G4733 Obstructive sleep apnea (adult) (pediatric): Secondary | ICD-10-CM | POA: Diagnosis not present

## 2017-04-26 DIAGNOSIS — Z923 Personal history of irradiation: Secondary | ICD-10-CM

## 2017-04-26 DIAGNOSIS — C3411 Malignant neoplasm of upper lobe, right bronchus or lung: Secondary | ICD-10-CM

## 2017-04-26 DIAGNOSIS — Z9221 Personal history of antineoplastic chemotherapy: Secondary | ICD-10-CM

## 2017-04-26 DIAGNOSIS — J449 Chronic obstructive pulmonary disease, unspecified: Secondary | ICD-10-CM | POA: Diagnosis not present

## 2017-04-26 DIAGNOSIS — E785 Hyperlipidemia, unspecified: Secondary | ICD-10-CM

## 2017-04-26 DIAGNOSIS — I10 Essential (primary) hypertension: Secondary | ICD-10-CM | POA: Diagnosis not present

## 2017-04-26 DIAGNOSIS — Z79899 Other long term (current) drug therapy: Secondary | ICD-10-CM

## 2017-04-26 DIAGNOSIS — Z9989 Dependence on other enabling machines and devices: Secondary | ICD-10-CM

## 2017-04-26 DIAGNOSIS — I4891 Unspecified atrial fibrillation: Secondary | ICD-10-CM | POA: Diagnosis not present

## 2017-04-26 DIAGNOSIS — R590 Localized enlarged lymph nodes: Secondary | ICD-10-CM | POA: Diagnosis not present

## 2017-04-26 LAB — COMPREHENSIVE METABOLIC PANEL
ALK PHOS: 101 U/L (ref 40–150)
ALT: 11 U/L (ref 0–55)
ANION GAP: 9 (ref 3–11)
AST: 16 U/L (ref 5–34)
Albumin: 3.5 g/dL (ref 3.5–5.0)
BUN: 29 mg/dL — ABNORMAL HIGH (ref 7–26)
CALCIUM: 9.3 mg/dL (ref 8.4–10.4)
CHLORIDE: 108 mmol/L (ref 98–109)
CO2: 26 mmol/L (ref 22–29)
Creatinine, Ser: 1.52 mg/dL — ABNORMAL HIGH (ref 0.70–1.30)
GFR calc Af Amer: 54 mL/min — ABNORMAL LOW (ref >60)
GFR calc non Af Amer: 46 mL/min — ABNORMAL LOW (ref >60)
Glucose, Bld: 97 mg/dL (ref 70–140)
Potassium: 3.9 mmol/L (ref 3.5–5.1)
SODIUM: 143 mmol/L (ref 136–145)
Total Bilirubin: 0.3 mg/dL (ref 0.2–1.2)
Total Protein: 6.8 g/dL (ref 6.4–8.3)

## 2017-04-26 LAB — CBC WITH DIFFERENTIAL/PLATELET
BASOS PCT: 1 %
Basophils Absolute: 0 10*3/uL (ref 0.0–0.1)
Eosinophils Absolute: 0.1 10*3/uL (ref 0.0–0.5)
Eosinophils Relative: 2 %
HEMATOCRIT: 35.8 % — AB (ref 38.4–49.9)
HEMOGLOBIN: 11.6 g/dL — AB (ref 13.0–17.1)
LYMPHS ABS: 0.5 10*3/uL — AB (ref 0.9–3.3)
Lymphocytes Relative: 14 %
MCH: 28.6 pg (ref 27.2–33.4)
MCHC: 32.5 g/dL (ref 32.0–36.0)
MCV: 88.2 fL (ref 79.3–98.0)
MONO ABS: 0.6 10*3/uL (ref 0.1–0.9)
MONOS PCT: 19 %
NEUTROS ABS: 2.1 10*3/uL (ref 1.5–6.5)
Neutrophils Relative %: 64 %
Platelets: 121 10*3/uL — ABNORMAL LOW (ref 140–400)
RBC: 4.06 MIL/uL — ABNORMAL LOW (ref 4.20–5.82)
RDW: 16.3 % — AB (ref 11.0–15.6)
WBC: 3.2 10*3/uL — ABNORMAL LOW (ref 4.0–10.3)

## 2017-04-26 LAB — TSH: TSH: 0.178 u[IU]/mL — ABNORMAL LOW (ref 0.320–4.118)

## 2017-04-26 MED ORDER — HEPARIN SOD (PORK) LOCK FLUSH 100 UNIT/ML IV SOLN
500.0000 [IU] | Freq: Once | INTRAVENOUS | Status: AC | PRN
  Administered 2017-04-26: 500 [IU]
  Filled 2017-04-26: qty 5

## 2017-04-26 MED ORDER — SODIUM CHLORIDE 0.9% FLUSH
10.0000 mL | INTRAVENOUS | Status: DC | PRN
  Administered 2017-04-26: 10 mL
  Filled 2017-04-26: qty 10

## 2017-04-26 MED ORDER — SODIUM CHLORIDE 0.9 % IV SOLN
Freq: Once | INTRAVENOUS | Status: AC
  Administered 2017-04-26: 11:00:00 via INTRAVENOUS

## 2017-04-26 MED ORDER — SODIUM CHLORIDE 0.9 % IV SOLN
1240.0000 mg | Freq: Once | INTRAVENOUS | Status: AC
  Administered 2017-04-26: 1240 mg via INTRAVENOUS
  Filled 2017-04-26: qty 20

## 2017-04-26 NOTE — Progress Notes (Signed)
[facility] [phone]   [phone]  OFFICE PROGRESS NOTE  Lucianne Lei, MD [address] [place] [address]  DIAGNOSIS: Stage IIIA (T1a, N2, M0) non-small cell lung cancer, adenocarcinoma presented with right upper lobe lung nodule in addition to mediastinal lymphadenopathy diagnosed in March 2018.  Biomarker Findings Tumor Mutational Burden - TMB-Intermediate (8 Muts/Mb) Microsatellite Status - MS-Stable Genomic Findings For a complete list of the genes assayed, please refer to the Appendix. ERBB2 amplification - equivocal? DNMT3A K438f*192 FUBP1 Q40* KEAP1 [id number] TP53 C275F 7 Disease relevant genes with no reportable alterations: EGFR, KRAS, ALK, BRAF, MET, RET, ROS1   PRIOR THERAPY: course of concurrent chemoradiation with weekly carboplatin for AUC of 2 and paclitaxel 45 MG/M2. Status post 6 cycles. Last cycle was given 10/16/2016.  CURRENT THERAPY:  Consolidation treatment with immunotherapy with Imfinzi (Durvalumab) 10 MG/M2 every 2 weeks. First dose 12/21/2016.  Status post 8 cycles.  INTERVAL HISTORY: Jason Plouff89.o. male returns to the clinic today for follow-up visit.  The patient denied having any current weight loss or night sweats.  He has no nausea, vomiting, diarrhea or constipation.  He denied having any fever or chills.  He continues to tolerate his treatment with Imfinzi (Durvalumab) fairly well.  He has no chest pain, shortness of breath, cough or hemoptysis.  He is here today for evaluation before starting cycle #9.  MEDICAL HISTORY: Past Medical History:  Diagnosis Date  . Adenocarcinoma of right lung, stage 3 ([facility]/23/2018  . Adenocarcinoma of right lung, stage 3 ([facility]5/23/2018  . Anemia   . Arthritis    "hx right hip"  . Asthma    "when I was a child"  . Atrial fibrillation (HCC)    Amiodarone started 10/2011; Coumadin  . Automatic implantable cardioverter-defibrillator in situ  10/03/2012   a. [employer] ICD implantation 10/03/12.  . Chronic anticoagulation   . Chronic fatigue 10/18/2016  . Chronic fatigue 10/18/2016  . Chronic systolic heart failure ([facility]   a. Echo 7/13: EF 25%;  b. echo 04/2012:  Mild LVH, EF 30-35%, Gr 1 DD, mild AI, mild MR, mild LAE  . COPD (chronic obstructive pulmonary disease) ([facility]  . Dyslipidemia   . Dysrhythmia   . Encounter for antineoplastic chemotherapy 08/24/2016  . Gout   . History of blood transfusion 10/15/2013   "don't know where the blood's going; HgB down to 5"  . Hyperlipidemia   . Hypertension   . Hypothyroidism   . NICM (nonischemic cardiomyopathy) ([facility]   [facility]/14:  minimal CAD  . Obesity   . OSA on CPAP   . Tobacco abuse     ALLERGIES:  has No Known Allergies.  MEDICATIONS:  Current Outpatient Medications  Medication Sig Dispense Refill  . Acetaminophen (TYLENOL) 325 MG CAPS Take 325 mg by mouth daily as needed.    . Marland Kitchencetaminophen (TYLENOL) 500 MG tablet Take 1,000 mg by mouth every 6 (six) hours as needed for mild pain.    . Marland Kitchenlbuterol (PROVENTIL HFA;VENTOLIN HFA) 108 (90 Base) MCG/ACT inhaler Inhale 2 puffs into the lungs every 4 (four) hours as needed for wheezing or shortness of breath. 1 Inhaler 2  . albuterol (PROVENTIL HFA;VENTOLIN HFA) 108 (90 Base) MCG/ACT inhaler Inhale 2 puffs into the lungs every 6 (six) hours as needed.    . Marland Kitchenmiodarone (PACERONE) 100 MG tablet TAKE 1 TABLET BY MOUTH DAILY 90 tablet 1  .  atorvastatin (LIPITOR) 20 MG tablet TAKE 1 TABLET BY MOUTH EVERY DAY 90 tablet 3  . carvedilol (COREG) 25 MG tablet TAKE 1 TABLET BY MOUTH TWICE A DAY WITH A MEAL 60 tablet 11  . [employer] D3 2000 units CAPS Take 2,000 Units by mouth daily.   11  . feeding supplement, ENSURE ENLIVE, (ENSURE ENLIVE) LIQD Take 237 mLs by mouth 2 (two) times daily between meals. 237 mL 12  . ferrous gluconate (FERGON) 324 MG tablet Take 1 tablet (324 mg total) by mouth 3 (three) times daily with meals. 90 tablet 8  .  Fluticasone-Umeclidin-Vilant (TRELEGY ELLIPTA) 100-62.5-25 MCG/INH AEPB Inhale 1 puff into the lungs daily. 1 each 6  . furosemide (LASIX) 20 MG tablet Take 3 tabs (60 mg) every Tue and Sat, all other days take 2 tabs (40 mg) (Patient taking differently: Take 40-60 mg by mouth See admin instructions. Take 3 tabs (60 mg) every Tue and Sat, all other days take 2 tabs (40 mg)) 64 tablet 6  . hydrALAZINE (APRESOLINE) 100 MG tablet Take 100 mg by mouth 3 (three) times daily.   1  . levothyroxine (SYNTHROID, LEVOTHROID) 50 MCG tablet TAKE 1 TABLET (50 MCG TOTAL) BY MOUTH DAILY BEFORE BREAKFAST. 90 tablet 3  . lidocaine-prilocaine (EMLA) cream Apply generous amount to port site at least 1 hr prior to treatment.  DO NOT RUB IN. 30 g 1  . lisinopril (PRINIVIL,ZESTRIL) 40 MG tablet TAKE 1 TABLET BY MOUTH DAILY. 90 tablet 2  . magnesium oxide (MAG-OX) 400 (241.3 Mg) MG tablet Take 1 tablet (400 mg total) by mouth daily. 30 tablet 0  . potassium chloride SA (KLOR-CON M20) 20 MEQ tablet TAKE 1 TABLET BY MOUTH EVERY DAY *ON DAYS WHEN LASIX IS TAKEN, TAKE 2 TABLETS. Please make appt with Dr. Harrington Challenger for February. 1st attempt 45 tablet 0  . prochlorperazine (COMPAZINE) 10 MG tablet Take 1 tablet (10 mg total) by mouth every 6 (six) hours as needed for nausea or vomiting. 30 tablet 0  . rivaroxaban (XARELTO) 20 MG TABS tablet Take 1 tablet (20 mg total) by mouth daily. 90 tablet 2  . sildenafil (VIAGRA) 100 MG tablet Take 100 mg by mouth.    . spironolactone (ALDACTONE) 25 MG tablet TAKE 0.5 TABLETS (12.5 MG TOTAL) BY MOUTH DAILY. 45 tablet 2  . sucralfate (CARAFATE) 1 g tablet TAKE 1 TABLET BY MOUTH FOUR TIMES DAILY (5 MINUTES BEFORE MEALS AND AT BEDTIME) FOR ESOPHAGITIS 120 tablet 2  . ULORIC 40 MG tablet Take 1 tablet (40 mg total) by mouth daily. 30 tablet 0  . Vitamin D, Ergocalciferol, (DRISDOL) 50000 units CAPS capsule Take 50,000 Units by mouth daily.     No current facility-administered medications for this  visit.     SURGICAL HISTORY:  Past Surgical History:  Procedure Laterality Date  . CARDIAC DEFIBRILLATOR PLACEMENT  2014  . CARDIOVERSION  2011  . COLONOSCOPY WITH PROPOFOL Left 10/17/2013   Procedure: COLONOSCOPY WITH PROPOFOL;  Surgeon: Inda Castle, MD;  Location: [facility];  Service: Endoscopy;  Laterality: Left;  . ESOPHAGOGASTRODUODENOSCOPY N/A 10/17/2013   Procedure: ESOPHAGOGASTRODUODENOSCOPY (EGD);  Surgeon: Inda Castle, MD;  Location: [facility];  Service: Endoscopy;  Laterality: N/A;  . GIVENS CAPSULE STUDY N/A 10/29/2013   Procedure: GIVENS CAPSULE STUDY;  Surgeon: Inda Castle, MD;  Location: [facility];  Service: Endoscopy;  Laterality: N/A;  . IMPLANTABLE CARDIOVERTER DEFIBRILLATOR IMPLANT Left 10/03/2012   Procedure: IMPLANTABLE CARDIOVERTER DEFIBRILLATOR IMPLANT;  Surgeon: Remo Lipps  Peterson Lombard, MD;  Location: [facility] CATH LAB;  Service: Cardiovascular;  Laterality: Left;  . IR FLUORO GUIDE PORT INSERTION RIGHT  09/21/2016  . IR US GUIDE VASC ACCESS RIGHT  09/21/2016  . JOINT REPLACEMENT    . TONSILLECTOMY  1950's  . TOTAL HIP ARTHROPLASTY Right 11/25/1997  . VIDEO BRONCHOSCOPY WITH ENDOBRONCHIAL ULTRASOUND N/A 08/09/2016   Procedure: VIDEO BRONCHOSCOPY WITH ENDOBRONCHIAL ULTRASOUND;  Surgeon: Javier Glazier, MD;  [facility];  Service: Thoracic;  Laterality: N/A;    REVIEW OF SYSTEMS:  A comprehensive review of systems was negative.   PHYSICAL EXAMINATION: General appearance: alert, cooperative and no distress Head: Normocephalic, without obvious abnormality, atraumatic Neck: no adenopathy, no JVD, supple, symmetrical, trachea midline and thyroid not enlarged, symmetric, no tenderness/mass/nodules Lymph nodes: Cervical, supraclavicular, and axillary nodes normal. Resp: clear to auscultation bilaterally Back: symmetric, no curvature. ROM normal. No CVA tenderness. Cardio: regular rate and rhythm, S1, S2 normal, no murmur, click, rub or gallop GI: soft,  non-tender; bowel sounds normal; no masses,  no organomegaly Extremities: extremities normal, atraumatic, no cyanosis or edema  ECOG PERFORMANCE STATUS: 1 - Symptomatic but completely ambulatory  Blood pressure (!) 152/78, pulse 75, temperature 97.7 F (36.5 C), temperature source Oral, resp. rate 18, height '6\' 1"'$  (1.854 m), weight 292 lb 11.2 oz (132.8 kg), SpO2 100 %.  LABORATORY DATA: Lab Results  Component Value Date   WBC 3.2 (L) 04/26/2017   HGB 11.6 (L) 04/26/2017   HCT 35.8 (L) 04/26/2017   MCV 88.2 04/26/2017   PLT 121 (L) 04/26/2017      Chemistry      Component Value Date/Time   NA 143 04/26/2017 0854   NA 141 03/29/2017 1038   K 3.9 04/26/2017 0854   K 4.0 03/29/2017 1038   CL 108 04/26/2017 0854   CO2 26 04/26/2017 0854   CO2 27 03/29/2017 1038   BUN 29 (H) 04/26/2017 0854   BUN 28.6 (H) 03/29/2017 1038   CREATININE 1.52 (H) 04/26/2017 0854   CREATININE 1.6 (H) 03/29/2017 1038      Component Value Date/Time   CALCIUM 9.3 04/26/2017 0854   CALCIUM 9.3 03/29/2017 1038   ALKPHOS 101 04/26/2017 0854   ALKPHOS 105 03/29/2017 1038   AST 16 04/26/2017 0854   AST 18 03/29/2017 1038   ALT 11 04/26/2017 0854   ALT 13 03/29/2017 1038   BILITOT 0.3 04/26/2017 0854   BILITOT 0.62 03/29/2017 1038       RADIOGRAPHIC STUDIES: No results found.  ASSESSMENT AND PLAN:  This is a very pleasant 67 years old African-American male with a stage IIIa non-small cell lung cancer, adenocarcinoma.  The patient completed 6 weeks of concurrent chemoradiation with weekly carboplatin and paclitaxel and tolerated his treatment well except for odynophagia. The patient is currently on consolidation treatment with immunotherapy with Imfinzi (Durvalumab) status post 8 cycles.  He continues to tolerate his treatment with immunotherapy fairly well. I recommended for the patient to proceed with cycle #9 today as scheduled. I will see him back for follow-up visit in 2 weeks for evaluation  before starting cycle #10. The patient was advised to call immediately if he has any concerning symptoms in the interval. The patient voices understanding of current disease status and treatment options and is in agreement with the current care plan. All questions were answered. The patient knows to call the clinic with any problems, questions or concerns. We can certainly see the patient much sooner if necessary.  Disclaimer: This note was dictated with voice recognition software. Similar sounding words can inadvertently be transcribed and may not be corrected upon review.

## 2017-04-26 NOTE — Telephone Encounter (Signed)
No additional appts to schedule at the moment - pt already has 3 cycles on schedule.

## 2017-04-26 NOTE — Patient Instructions (Signed)
[facility] Discharge Instructions for Patients Receiving Chemotherapy  Today you received the following chemotherapy agents: Imfinzi.  To help prevent nausea and vomiting after your treatment, we encourage you to take your nausea medication as directed.   If you develop nausea and vomiting that is not controlled by your nausea medication, call the clinic.   BELOW ARE SYMPTOMS THAT SHOULD BE REPORTED IMMEDIATELY:  *FEVER GREATER THAN 100.5 F  *CHILLS WITH OR WITHOUT FEVER  NAUSEA AND VOMITING THAT IS NOT CONTROLLED WITH YOUR NAUSEA MEDICATION  *UNUSUAL SHORTNESS OF BREATH  *UNUSUAL BRUISING OR BLEEDING  TENDERNESS IN MOUTH AND THROAT WITH OR WITHOUT PRESENCE OF ULCERS  *URINARY PROBLEMS  *BOWEL PROBLEMS  UNUSUAL RASH Items with * indicate a potential emergency and should be followed up as soon as possible.  Feel free to call the clinic should you have any questions or concerns. The clinic phone number is [phone].  Please show the CHEMO ALERT CARD at check-in to the Emergency Department and triage nurse.   

## 2017-04-30 ENCOUNTER — Other Ambulatory Visit: Payer: Self-pay | Admitting: Internal Medicine

## 2017-05-07 ENCOUNTER — Ambulatory Visit (INDEPENDENT_AMBULATORY_CARE_PROVIDER_SITE_OTHER): Payer: Medicare Other

## 2017-05-07 DIAGNOSIS — Z95810 Presence of automatic (implantable) cardiac defibrillator: Secondary | ICD-10-CM | POA: Diagnosis not present

## 2017-05-07 DIAGNOSIS — I5022 Chronic systolic (congestive) heart failure: Secondary | ICD-10-CM

## 2017-05-08 ENCOUNTER — Telehealth: Payer: Self-pay

## 2017-05-08 NOTE — Progress Notes (Signed)
EPIC Encounter for ICM Monitoring  Patient Name: Jason Russell is a 67 y.o. male Date: 05/08/2017 Primary Care Physican: Lucianne Lei, MD Primary Cardiologist:Ross Electrophysiologist: Faustino Congress Weight: Previous weight 286 lbs         Attempted call to patient and unable to reach.  Left message to return call.  Transmission reviewed.    Thoracic impedance normal.  Prescribed dosage: Furosemide 20 mg 3 tablets (60 mg total) every Tuesday and Saturday and 40 mg all the other days. Potassium 20 mEq 1 tablet every day and on days when lasix is taken, take 2 tablets  Labs: 04/26/2017 Creatinine 1.52, BUN 29, Potassium 3.9, Sodium 143, EGFR 46-54 04/12/2017 Creatinine 1.72, BUN 37, Potassium 3.6, Sodium 142, EGFR 40-46  03/29/2017 Creatinine 1.6,   BUN 28.6, Potassium 4.0, Sodium 141, EGFR 51 03/15/2017 Creatinine 1.7,   BUN 33.6, Potassium 3.7, Sodium 142, EGFR 49  03/01/2017 Creatinine 1.5,   BUN 22.8, Potassium 3.7, Sodium 143, EGFR 55  02/15/2017 Creatinine 1.6,   BUN 20.4, Potassium 3.9, Sodium 142, EGFR 54  02/01/2017 Creatinine 1.4,   BUN 28.3, Potassium 4.0, Sodium 141, EGFR >60  01/18/2017 Creatinine 1.4,   BUN 25.6, Potassium 3.8, Sodium 142, EGFR >60  01/15/2017 Creatinine 1.56, BUN 28,    Potassium 4.1, Sodium 144  01/09/2017 Creatinine 1.58, BUN 19,    Potassium 3.6, Sodium 140  01/08/2017 Creatinine 1.54, BUN 26,    Potassium 3.6, Sodium 139  01/07/2017 Creatinine 1.62, BUN 16,    Potassium 3.5, Sodium 136  01/04/2017 Creatinine 1.9,   BUN 23.5, Potassium 3.6, Sodium 140, EGFR 41 A complete set of results can be found in results review.  Recommendations:  NONE - Unable to reach.  Follow-up plan: ICM clinic phone appointment on 06/08/2017  Copy of ICM check sent to Dr. Caryl Comes.   3 month ICM trend: 05/08/2017    1 Year ICM trend:       [name], RN 05/08/2017 10:15 AM

## 2017-05-08 NOTE — Progress Notes (Signed)
Patient returned call and stated he is doing well.  Denied any fluid symptoms.  Weight is 284 lbs.  Transmission reviewed and next ICM transmission 06/08/2017.  No changes today and encouraged to call if needed.

## 2017-05-08 NOTE — Telephone Encounter (Signed)
Remote ICM transmission received.  Attempted call to patient and left message to return call. 

## 2017-05-09 ENCOUNTER — Other Ambulatory Visit: Payer: Self-pay | Admitting: Internal Medicine

## 2017-05-10 ENCOUNTER — Inpatient Hospital Stay: Payer: Medicare Other

## 2017-05-10 ENCOUNTER — Encounter: Payer: Self-pay | Admitting: Internal Medicine

## 2017-05-10 ENCOUNTER — Inpatient Hospital Stay: Payer: Medicare Other | Attending: Internal Medicine | Admitting: Internal Medicine

## 2017-05-10 ENCOUNTER — Telehealth: Payer: Self-pay | Admitting: Internal Medicine

## 2017-05-10 VITALS — BP 148/73 | HR 80 | Temp 98.0°F | Resp 22 | Ht 73.0 in | Wt 300.9 lb

## 2017-05-10 DIAGNOSIS — J449 Chronic obstructive pulmonary disease, unspecified: Secondary | ICD-10-CM | POA: Insufficient documentation

## 2017-05-10 DIAGNOSIS — I5022 Chronic systolic (congestive) heart failure: Secondary | ICD-10-CM | POA: Insufficient documentation

## 2017-05-10 DIAGNOSIS — C3411 Malignant neoplasm of upper lobe, right bronchus or lung: Secondary | ICD-10-CM

## 2017-05-10 DIAGNOSIS — Z923 Personal history of irradiation: Secondary | ICD-10-CM | POA: Diagnosis not present

## 2017-05-10 DIAGNOSIS — Z9221 Personal history of antineoplastic chemotherapy: Secondary | ICD-10-CM | POA: Insufficient documentation

## 2017-05-10 DIAGNOSIS — R5382 Chronic fatigue, unspecified: Secondary | ICD-10-CM | POA: Diagnosis not present

## 2017-05-10 DIAGNOSIS — Z9989 Dependence on other enabling machines and devices: Secondary | ICD-10-CM | POA: Diagnosis not present

## 2017-05-10 DIAGNOSIS — I4891 Unspecified atrial fibrillation: Secondary | ICD-10-CM | POA: Diagnosis not present

## 2017-05-10 DIAGNOSIS — E039 Hypothyroidism, unspecified: Secondary | ICD-10-CM | POA: Insufficient documentation

## 2017-05-10 DIAGNOSIS — R1310 Dysphagia, unspecified: Secondary | ICD-10-CM | POA: Insufficient documentation

## 2017-05-10 DIAGNOSIS — Z79899 Other long term (current) drug therapy: Secondary | ICD-10-CM | POA: Diagnosis not present

## 2017-05-10 DIAGNOSIS — G4733 Obstructive sleep apnea (adult) (pediatric): Secondary | ICD-10-CM | POA: Diagnosis not present

## 2017-05-10 DIAGNOSIS — Z5112 Encounter for antineoplastic immunotherapy: Secondary | ICD-10-CM

## 2017-05-10 DIAGNOSIS — I110 Hypertensive heart disease with heart failure: Secondary | ICD-10-CM | POA: Insufficient documentation

## 2017-05-10 DIAGNOSIS — E785 Hyperlipidemia, unspecified: Secondary | ICD-10-CM | POA: Diagnosis not present

## 2017-05-10 LAB — COMPREHENSIVE METABOLIC PANEL
ALT: 11 U/L (ref 0–55)
ANION GAP: 7 (ref 3–11)
AST: 16 U/L (ref 5–34)
Albumin: 3.5 g/dL (ref 3.5–5.0)
Alkaline Phosphatase: 106 U/L (ref 40–150)
BUN: 26 mg/dL (ref 7–26)
CHLORIDE: 108 mmol/L (ref 98–109)
CO2: 28 mmol/L (ref 22–29)
Calcium: 9.2 mg/dL (ref 8.4–10.4)
Creatinine, Ser: 1.54 mg/dL — ABNORMAL HIGH (ref 0.70–1.30)
GFR, EST AFRICAN AMERICAN: 53 mL/min — AB (ref >60)
GFR, EST NON AFRICAN AMERICAN: 46 mL/min — AB (ref >60)
Glucose, Bld: 107 mg/dL (ref 70–140)
POTASSIUM: 3.4 mmol/L — AB (ref 3.5–5.1)
SODIUM: 143 mmol/L (ref 136–145)
Total Bilirubin: 0.3 mg/dL (ref 0.2–1.2)
Total Protein: 6.7 g/dL (ref 6.4–8.3)

## 2017-05-10 LAB — CBC WITH DIFFERENTIAL/PLATELET
Basophils Absolute: 0 10*3/uL (ref 0.0–0.1)
Basophils Relative: 1 %
EOS ABS: 0.1 10*3/uL (ref 0.0–0.5)
EOS PCT: 2 %
HCT: 35.4 % — ABNORMAL LOW (ref 38.4–49.9)
Hemoglobin: 11.4 g/dL — ABNORMAL LOW (ref 13.0–17.1)
LYMPHS ABS: 0.5 10*3/uL — AB (ref 0.9–3.3)
LYMPHS PCT: 12 %
MCH: 28.5 pg (ref 27.2–33.4)
MCHC: 32.3 g/dL (ref 32.0–36.0)
MCV: 88.2 fL (ref 79.3–98.0)
MONO ABS: 0.6 10*3/uL (ref 0.1–0.9)
Monocytes Relative: 17 %
Neutro Abs: 2.5 10*3/uL (ref 1.5–6.5)
Neutrophils Relative %: 68 %
PLATELETS: 129 10*3/uL — AB (ref 140–400)
RBC: 4.01 MIL/uL — ABNORMAL LOW (ref 4.20–5.82)
RDW: 16.1 % — AB (ref 11.0–14.6)
WBC: 3.7 10*3/uL — ABNORMAL LOW (ref 4.0–10.3)

## 2017-05-10 MED ORDER — SODIUM CHLORIDE 0.9% FLUSH
10.0000 mL | INTRAVENOUS | Status: DC | PRN
  Administered 2017-05-10: 10 mL
  Filled 2017-05-10: qty 10

## 2017-05-10 MED ORDER — HEPARIN SOD (PORK) LOCK FLUSH 100 UNIT/ML IV SOLN
500.0000 [IU] | Freq: Once | INTRAVENOUS | Status: AC | PRN
  Administered 2017-05-10: 500 [IU]
  Filled 2017-05-10: qty 5

## 2017-05-10 MED ORDER — SODIUM CHLORIDE 0.9 % IV SOLN
Freq: Once | INTRAVENOUS | Status: AC
  Administered 2017-05-10: 11:00:00 via INTRAVENOUS

## 2017-05-10 MED ORDER — SODIUM CHLORIDE 0.9 % IV SOLN
9.5000 mg/kg | Freq: Once | INTRAVENOUS | Status: AC
  Administered 2017-05-10: 1240 mg via INTRAVENOUS
  Filled 2017-05-10: qty 20

## 2017-05-10 MED ORDER — SODIUM CHLORIDE 0.9 % IV SOLN
9.4000 mg/kg | Freq: Once | INTRAVENOUS | Status: DC
  Filled 2017-05-10: qty 24.4

## 2017-05-10 NOTE — Telephone Encounter (Signed)
Per 2/7 patient decline avs and calendar

## 2017-05-10 NOTE — Patient Instructions (Signed)
[facility] Discharge Instructions for Patients Receiving Chemotherapy  Today you received the following chemotherapy agents: Imfinzi.  To help prevent nausea and vomiting after your treatment, we encourage you to take your nausea medication as directed.   If you develop nausea and vomiting that is not controlled by your nausea medication, call the clinic.   BELOW ARE SYMPTOMS THAT SHOULD BE REPORTED IMMEDIATELY:  *FEVER GREATER THAN 100.5 F  *CHILLS WITH OR WITHOUT FEVER  NAUSEA AND VOMITING THAT IS NOT CONTROLLED WITH YOUR NAUSEA MEDICATION  *UNUSUAL SHORTNESS OF BREATH  *UNUSUAL BRUISING OR BLEEDING  TENDERNESS IN MOUTH AND THROAT WITH OR WITHOUT PRESENCE OF ULCERS  *URINARY PROBLEMS  *BOWEL PROBLEMS  UNUSUAL RASH Items with * indicate a potential emergency and should be followed up as soon as possible.  Feel free to call the clinic should you have any questions or concerns. The clinic phone number is [phone].  Please show the CHEMO ALERT CARD at check-in to the Emergency Department and triage nurse.   

## 2017-05-10 NOTE — Progress Notes (Signed)
[place] [phone]   Fax:([phone]  OFFICE PROGRESS NOTE  Lucianne Lei, MD [address] [place] [address]  DIAGNOSIS: Stage IIIA (T1a, N2, M0) non-small cell lung cancer, adenocarcinoma presented with right upper lobe lung nodule in addition to mediastinal lymphadenopathy diagnosed in March 2018.  Biomarker Findings Tumor Mutational Burden - TMB-Intermediate (8 Muts/Mb) Microsatellite Status - MS-Stable Genomic Findings For a complete list of the genes assayed, please refer to the Appendix. ERBB2 amplification - equivocal? DNMT3A K436f*192 FUBP1 Q40* KEAP1 G339f68 TP53 C275F 7 Disease relevant genes with no reportable alterations: EGFR, KRAS, ALK, BRAF, MET, RET, ROS1   PRIOR THERAPY: course of concurrent chemoradiation with weekly carboplatin for AUC of 2 and paclitaxel 45 MG/M2. Status post 6 cycles. Last cycle was given 10/16/2016.  CURRENT THERAPY:  Consolidation treatment with immunotherapy with Imfinzi (Durvalumab) 10 MG/M2 every 2 weeks. First dose 12/21/2016.  Status post 9 cycles.  INTERVAL HISTORY: Jason Innes67.o. male returns back to the clinic today for follow-up visit.  The patient has no complaints today.  He continues to tolerate his treatment with Imfinzi (Durvalumab) fairly well.  He denied having any chest pain, shortness of breath, cough or hemoptysis.  He denied having any fever or chills.  He has no nausea, vomiting, diarrhea or constipation.  He is here today for evaluation before starting cycle #10.  MEDICAL HISTORY: Past Medical History:  Diagnosis Date  . Adenocarcinoma of right lung, stage 3 (HCH[facility]5/23/2018  . Adenocarcinoma of right lung, stage 3 (HCFloriss[place]5/23/2018  . Anemia   . Arthritis    "hx right hip"  . Asthma    "when I was a child"  . Atrial fibrillation (HCC)    Amiodarone started 10/2011; Coumadin  . 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 [employer] ICD implantation 10/03/12.  . Chronic anticoagulation   . Chronic fatigue 10/18/2016  . Chronic fatigue 10/18/2016  . Chronic systolic heart failure (HCDub[place]   a. Echo 7/13: EF 25%;  b. echo 04/2012:  Mild LVH, EF 30-35%, Gr 1 DD, mild AI, mild MR, mild LAE  . COPD (chronic obstructive pulmonary disease) (HCM[facility]rist[facility]  . Dyslipidemia   . Dysrhythmia   . Encounter for antineoplastic chemotherapy 08/24/2016  . Gout   . History of blood transfusion 10/15/2013   "don't know where the blood's going; HgB down to 5"  . Hyperlipidemia   . Hypertension   . Hypothyroidism   . NICM (nonischemic cardiomyopathy) (HC[facility]   [facility]/14:  minimal CAD  . Obesity   . OSA on CPAP   . Tobacco abuse     ALLERGIES:  has No Known Allergies.  MEDICATIONS:  Current Outpatient Medications  Medication Sig Dispense Refill  . albuterol (PROVENTIL HFA;VENTOLIN HFA) 108 (90 Base) MCG/ACT inhaler Inhale 2 puffs into the lungs every 4 (four) hours as needed for wheezing or shortness of breath. 1 Inhaler 2  . amiodarone (PACERONE) 100 MG tablet Take 1 tablet (100 mg total) by mouth daily. Please make yearly appt with Dr. RoHarrington Challengeror February before anymore refills. 1st attempt 30 tablet 0  . atorvastatin (LIPITOR) 20 MG tablet TAKE 1 TABLET BY MOUTH EVERY DAY 90 tablet 3  . buPROPion (WELLBUTRIN XL) 150 MG 24 hr tablet Take by mouth.    . carvedilol (COREG) 25 MG tablet TAKE 1 TABLET BY MOUTH TWICE A DAY WITH A MEAL 60 tablet 11  .  [employer] D3 2000 units CAPS Take 2,000 Units by mouth daily.   11  . feeding supplement, ENSURE ENLIVE, (ENSURE ENLIVE) LIQD Take 237 mLs by mouth 2 (two) times daily between meals. 237 mL 12  . ferrous gluconate (FERGON) 324 MG tablet Take 1 tablet (324 mg total) by mouth 3 (three) times daily with meals. 90 tablet 8  . Fluticasone-Umeclidin-Vilant (TRELEGY ELLIPTA) 100-62.5-25 MCG/INH AEPB Inhale 1 puff into the lungs daily. 1 each 6  . furosemide (LASIX) 20 MG tablet Take 3 tabs (60 mg) every Tue and Sat,  all other days take 2 tabs (40 mg) (Patient taking differently: Take 40-60 mg by mouth See admin instructions. Take 3 tabs (60 mg) every Tue and Sat, all other days take 2 tabs (40 mg)) 64 tablet 6  . hydrALAZINE (APRESOLINE) 100 MG tablet Take 100 mg by mouth 3 (three) times daily.   1  . levothyroxine (SYNTHROID, LEVOTHROID) 50 MCG tablet TAKE 1 TABLET (50 MCG TOTAL) BY MOUTH DAILY BEFORE BREAKFAST. 90 tablet 3  . lidocaine-prilocaine (EMLA) cream Apply generous amount to port site at least 1 hr prior to treatment.  DO NOT RUB IN. 30 g 1  . lisinopril (PRINIVIL,ZESTRIL) 40 MG tablet TAKE 1 TABLET BY MOUTH DAILY. 90 tablet 2  . magnesium oxide (MAG-OX) 400 (241.3 Mg) MG tablet Take 1 tablet (400 mg total) by mouth daily. 30 tablet 0  . potassium chloride SA (KLOR-CON M20) 20 MEQ tablet TAKE 1 TABLET BY MOUTH EVERY DAY *ON DAYS WHEN LASIX IS TAKEN, TAKE 2 TABLETS* NEED APPT FOR REFILLS 2nd attempt 45 tablet 0  . rivaroxaban (XARELTO) 20 MG TABS tablet Take 1 tablet (20 mg total) by mouth daily. 90 tablet 2  . spironolactone (ALDACTONE) 25 MG tablet TAKE 0.5 TABLETS (12.5 MG TOTAL) BY MOUTH DAILY. 45 tablet 2  . sucralfate (CARAFATE) 1 g tablet TAKE 1 TABLET BY MOUTH FOUR TIMES DAILY (5 MINUTES BEFORE MEALS AND AT BEDTIME) FOR ESOPHAGITIS 120 tablet 2  . ULORIC 40 MG tablet Take 1 tablet (40 mg total) by mouth daily. 30 tablet 0  . Vitamin D, Ergocalciferol, (DRISDOL) 50000 units CAPS capsule Take 50,000 Units by mouth daily.    . Acetaminophen (TYLENOL) 325 MG CAPS Take 325 mg by mouth daily as needed.    [name] acetaminophen (TYLENOL) 500 MG tablet Take 1,000 mg by mouth every 6 (six) hours as needed for mild pain.    [name] prochlorperazine (COMPAZINE) 10 MG tablet Take 1 tablet (10 mg total) by mouth every 6 (six) hours as needed for nausea or vomiting. (Patient not taking: Reported on 05/10/2017) 30 tablet 0  . sildenafil (VIAGRA) 100 MG tablet Take 100 mg by mouth.     No current facility-administered  medications for this visit.     SURGICAL HISTORY:  Past Surgical History:  Procedure Laterality Date  . CARDIAC DEFIBRILLATOR PLACEMENT  2014  . CARDIOVERSION  2011  . COLONOSCOPY WITH PROPOFOL Left 10/17/2013   Procedure: COLONOSCOPY WITH PROPOFOL;  Surgeon: Inda Castle, MD;  Location: [place];  Service: Endoscopy;  Laterality: Left;  . ESOPHAGOGASTRODUODENOSCOPY N/A 10/17/2013   Procedure: ESOPHAGOGASTRODUODENOSCOPY (EGD);  Surgeon: Inda Castle, MD;  Location: [place];  Service: Endoscopy;  Laterality: N/A;  . GIVENS CAPSULE STUDY N/A 10/29/2013   Procedure: GIVENS CAPSULE STUDY;  Surgeon: Inda Castle, MD;  Location: [facility];  Service: Endoscopy;  Laterality: N/A;  . IMPLANTABLE CARDIOVERTER DEFIBRILLATOR IMPLANT Left 10/03/2012   Procedure: IMPLANTABLE CARDIOVERTER DEFIBRILLATOR  IMPLANT;  Surgeon: Deboraha Sprang, MD;  Location: [facility] CATH LAB;  Service: Cardiovascular;  Laterality: Left;  . IR FLUORO GUIDE PORT INSERTION RIGHT  09/21/2016  . IR US GUIDE VASC ACCESS RIGHT  09/21/2016  . JOINT REPLACEMENT    . TONSILLECTOMY  1950's  . TOTAL HIP ARTHROPLASTY Right 11/25/1997  . VIDEO BRONCHOSCOPY WITH ENDOBRONCHIAL ULTRASOUND N/A 08/09/2016   Procedure: VIDEO BRONCHOSCOPY WITH ENDOBRONCHIAL ULTRASOUND;  Surgeon: Javier Glazier, MD;  [facility];  Service: Thoracic;  Laterality: N/A;    REVIEW OF SYSTEMS:  A comprehensive review of systems was negative.   PHYSICAL EXAMINATION: General appearance: alert, cooperative and no distress Head: Normocephalic, without obvious abnormality, atraumatic Neck: no adenopathy, no JVD, supple, symmetrical, trachea midline and thyroid not enlarged, symmetric, no tenderness/mass/nodules Lymph nodes: Cervical, supraclavicular, and axillary nodes normal. Resp: clear to auscultation bilaterally Back: symmetric, no curvature. ROM normal. No CVA tenderness. Cardio: regular rate and rhythm, S1, S2 normal, no murmur, click, rub or  gallop GI: soft, non-tender; bowel sounds normal; no masses,  no organomegaly Extremities: extremities normal, atraumatic, no cyanosis or edema  ECOG PERFORMANCE STATUS: 1 - Symptomatic but completely ambulatory  Blood pressure (!) 148/73, pulse 80, temperature 98 F (36.7 C), temperature source Oral, resp. rate (!) 22, height '6\' 1"'$  (1.854 m), weight (!) 300 lb 14.4 oz (136.5 kg), SpO2 99 %.  LABORATORY DATA: Lab Results  Component Value Date   WBC 3.7 (L) 05/10/2017   HGB 11.4 (L) 05/10/2017   HCT 35.4 (L) 05/10/2017   MCV 88.2 05/10/2017   PLT 129 (L) 05/10/2017      Chemistry      Component Value Date/Time   NA 143 04/26/2017 0854   NA 141 03/29/2017 1038   K 3.9 04/26/2017 0854   K 4.0 03/29/2017 1038   CL 108 04/26/2017 0854   CO2 26 04/26/2017 0854   CO2 27 03/29/2017 1038   BUN 29 (H) 04/26/2017 0854   BUN 28.6 (H) 03/29/2017 1038   CREATININE 1.52 (H) 04/26/2017 0854   CREATININE 1.6 (H) 03/29/2017 1038      Component Value Date/Time   CALCIUM 9.3 04/26/2017 0854   CALCIUM 9.3 03/29/2017 1038   ALKPHOS 101 04/26/2017 0854   ALKPHOS 105 03/29/2017 1038   AST 16 04/26/2017 0854   AST 18 03/29/2017 1038   ALT 11 04/26/2017 0854   ALT 13 03/29/2017 1038   BILITOT 0.3 04/26/2017 0854   BILITOT 0.62 03/29/2017 1038       RADIOGRAPHIC STUDIES: No results found.  ASSESSMENT AND PLAN:  This is a very pleasant 67 years old African-American male with a stage IIIa non-small cell lung cancer, adenocarcinoma.  The patient completed 6 weeks of concurrent chemoradiation with weekly carboplatin and paclitaxel and tolerated his treatment well except for odynophagia. The patient is currently on consolidation treatment with immunotherapy with Imfinzi (Durvalumab) status post 9 cycles.  The patient tolerated the last cycle of his treatment well.  I recommended for him to proceed with cycle #10 today as a schedule. I will see him back for follow-up visit in 2 weeks for  evaluation before starting cycle #11. He was advised to call immediately if he has any concerning symptoms in the interval. The patient voices understanding of current disease status and treatment options and is in agreement with the current care plan. All questions were answered. The patient knows to call the clinic with any problems, questions or concerns. We can certainly see the  patient much sooner if necessary.  Disclaimer: This note was dictated with voice recognition software. Similar sounding words can inadvertently be transcribed and may not be corrected upon review.

## 2017-05-24 ENCOUNTER — Inpatient Hospital Stay: Payer: Medicare Other

## 2017-05-24 ENCOUNTER — Telehealth: Payer: Self-pay | Admitting: Internal Medicine

## 2017-05-24 ENCOUNTER — Encounter: Payer: Self-pay | Admitting: Internal Medicine

## 2017-05-24 ENCOUNTER — Inpatient Hospital Stay (HOSPITAL_BASED_OUTPATIENT_CLINIC_OR_DEPARTMENT_OTHER): Payer: Medicare Other | Admitting: Internal Medicine

## 2017-05-24 VITALS — BP 142/68 | HR 86 | Temp 98.1°F | Resp 20 | Ht 73.0 in | Wt 291.7 lb

## 2017-05-24 DIAGNOSIS — R5382 Chronic fatigue, unspecified: Secondary | ICD-10-CM

## 2017-05-24 DIAGNOSIS — C3411 Malignant neoplasm of upper lobe, right bronchus or lung: Secondary | ICD-10-CM

## 2017-05-24 DIAGNOSIS — I110 Hypertensive heart disease with heart failure: Secondary | ICD-10-CM | POA: Diagnosis not present

## 2017-05-24 DIAGNOSIS — I5022 Chronic systolic (congestive) heart failure: Secondary | ICD-10-CM

## 2017-05-24 DIAGNOSIS — Z9989 Dependence on other enabling machines and devices: Secondary | ICD-10-CM

## 2017-05-24 DIAGNOSIS — G4733 Obstructive sleep apnea (adult) (pediatric): Secondary | ICD-10-CM

## 2017-05-24 DIAGNOSIS — E785 Hyperlipidemia, unspecified: Secondary | ICD-10-CM

## 2017-05-24 DIAGNOSIS — Z923 Personal history of irradiation: Secondary | ICD-10-CM

## 2017-05-24 DIAGNOSIS — I4891 Unspecified atrial fibrillation: Secondary | ICD-10-CM

## 2017-05-24 DIAGNOSIS — Z5112 Encounter for antineoplastic immunotherapy: Secondary | ICD-10-CM

## 2017-05-24 DIAGNOSIS — E039 Hypothyroidism, unspecified: Secondary | ICD-10-CM

## 2017-05-24 DIAGNOSIS — R1310 Dysphagia, unspecified: Secondary | ICD-10-CM

## 2017-05-24 DIAGNOSIS — Z9221 Personal history of antineoplastic chemotherapy: Secondary | ICD-10-CM

## 2017-05-24 DIAGNOSIS — J449 Chronic obstructive pulmonary disease, unspecified: Secondary | ICD-10-CM

## 2017-05-24 DIAGNOSIS — Z79899 Other long term (current) drug therapy: Secondary | ICD-10-CM

## 2017-05-24 LAB — COMPREHENSIVE METABOLIC PANEL
ALK PHOS: 108 U/L (ref 40–150)
ALT: 15 U/L (ref 0–55)
ANION GAP: 8 (ref 3–11)
AST: 17 U/L (ref 5–34)
Albumin: 3.4 g/dL — ABNORMAL LOW (ref 3.5–5.0)
BILIRUBIN TOTAL: 0.5 mg/dL (ref 0.2–1.2)
BUN: 25 mg/dL (ref 7–26)
CALCIUM: 9.5 mg/dL (ref 8.4–10.4)
CO2: 25 mmol/L (ref 22–29)
CREATININE: 1.6 mg/dL — AB (ref 0.70–1.30)
Chloride: 109 mmol/L (ref 98–109)
GFR calc non Af Amer: 44 mL/min — ABNORMAL LOW (ref >60)
GFR, EST AFRICAN AMERICAN: 51 mL/min — AB (ref >60)
Glucose, Bld: 101 mg/dL (ref 70–140)
Potassium: 4.2 mmol/L (ref 3.5–5.1)
SODIUM: 142 mmol/L (ref 136–145)
TOTAL PROTEIN: 6.8 g/dL (ref 6.4–8.3)

## 2017-05-24 LAB — CBC WITH DIFFERENTIAL/PLATELET
BASOS ABS: 0 10*3/uL (ref 0.0–0.1)
BASOS PCT: 0 %
EOS ABS: 0.1 10*3/uL (ref 0.0–0.5)
Eosinophils Relative: 1 %
HCT: 35.8 % — ABNORMAL LOW (ref 38.4–49.9)
HEMOGLOBIN: 11.4 g/dL — AB (ref 13.0–17.1)
Lymphocytes Relative: 11 %
Lymphs Abs: 0.5 10*3/uL — ABNORMAL LOW (ref 0.9–3.3)
MCH: 28.9 pg (ref 27.2–33.4)
MCHC: 31.8 g/dL — AB (ref 32.0–36.0)
MCV: 90.6 fL (ref 79.3–98.0)
MONO ABS: 0.8 10*3/uL (ref 0.1–0.9)
MONOS PCT: 17 %
NEUTROS ABS: 3.2 10*3/uL (ref 1.5–6.5)
NEUTROS PCT: 71 %
Platelets: 125 10*3/uL — ABNORMAL LOW (ref 140–400)
RBC: 3.95 MIL/uL — ABNORMAL LOW (ref 4.20–5.82)
RDW: 15.5 % — AB (ref 11.0–14.6)
WBC: 4.7 10*3/uL (ref 4.0–10.3)

## 2017-05-24 LAB — TSH: TSH: 0.137 u[IU]/mL — AB (ref 0.320–4.118)

## 2017-05-24 MED ORDER — SODIUM CHLORIDE 0.9 % IV SOLN
Freq: Once | INTRAVENOUS | Status: AC
  Administered 2017-05-24: 09:00:00 via INTRAVENOUS

## 2017-05-24 MED ORDER — SODIUM CHLORIDE 0.9% FLUSH
10.0000 mL | INTRAVENOUS | Status: DC | PRN
  Administered 2017-05-24: 10 mL
  Filled 2017-05-24: qty 10

## 2017-05-24 MED ORDER — SODIUM CHLORIDE 0.9 % IV SOLN
1240.0000 mg | Freq: Once | INTRAVENOUS | Status: AC
  Administered 2017-05-24: 1240 mg via INTRAVENOUS
  Filled 2017-05-24: qty 4.8

## 2017-05-24 MED ORDER — HEPARIN SOD (PORK) LOCK FLUSH 100 UNIT/ML IV SOLN
500.0000 [IU] | Freq: Once | INTRAVENOUS | Status: AC | PRN
  Administered 2017-05-24: 500 [IU]
  Filled 2017-05-24: qty 5

## 2017-05-24 NOTE — Telephone Encounter (Signed)
No additional appts to schedule - 3 cycles already scheduled

## 2017-05-24 NOTE — Progress Notes (Signed)
Ok to treat with Cr of 1.6 per Dr. Julien Nordmann

## 2017-05-24 NOTE — Patient Instructions (Signed)
[facility] Discharge Instructions for Patients Receiving Chemotherapy  Today you received the following chemotherapy agents: Imfinzi.  To help prevent nausea and vomiting after your treatment, we encourage you to take your nausea medication as directed.   If you develop nausea and vomiting that is not controlled by your nausea medication, call the clinic.   BELOW ARE SYMPTOMS THAT SHOULD BE REPORTED IMMEDIATELY:  *FEVER GREATER THAN 100.5 F  *CHILLS WITH OR WITHOUT FEVER  NAUSEA AND VOMITING THAT IS NOT CONTROLLED WITH YOUR NAUSEA MEDICATION  *UNUSUAL SHORTNESS OF BREATH  *UNUSUAL BRUISING OR BLEEDING  TENDERNESS IN MOUTH AND THROAT WITH OR WITHOUT PRESENCE OF ULCERS  *URINARY PROBLEMS  *BOWEL PROBLEMS  UNUSUAL RASH Items with * indicate a potential emergency and should be followed up as soon as possible.  Feel free to call the clinic should you have any questions or concerns. The clinic phone number is [phone].  Please show the CHEMO ALERT CARD at check-in to the Emergency Department and triage nurse.   

## 2017-05-24 NOTE — Progress Notes (Signed)
[place] Telephone:(336) [phone]   Fax:(336) [phone]  OFFICE PROGRESS NOTE  Lucianne Lei, MD [address] [place] [address]  DIAGNOSIS: Stage IIIA (T1a, N2, M0) non-small cell lung cancer, adenocarcinoma presented with right upper lobe lung nodule in addition to mediastinal lymphadenopathy diagnosed in March 2018.  Biomarker Findings Tumor Mutational Burden - TMB-Intermediate (8 Muts/Mb) Microsatellite Status - MS-Stable Genomic Findings For a complete list of the genes assayed, please refer to the Appendix. ERBB2 amplification - equivocal? DNMT3A K447f*192 FUBP1 Q40* KEAP1 [id number] TP53 C275F 7 Disease relevant genes with no reportable alterations: EGFR, KRAS, ALK, BRAF, MET, RET, ROS1   PRIOR THERAPY: course of concurrent chemoradiation with weekly carboplatin for AUC of 2 and paclitaxel 45 MG/M2. Status post 6 cycles. Last cycle was given 10/16/2016.  CURRENT THERAPY:  Consolidation treatment with immunotherapy with Imfinzi (Durvalumab) 10 MG/M2 every 2 weeks. First dose 12/21/2016.  Status post 10 cycles.  INTERVAL HISTORY: Jason [age]uhnert89.o. male returns to the clinic today for follow-up visit.  The patient is feeling fine today with no specific complaints.  He continues to tolerate his immunotherapy fairly well.  He denied having any chest pain, shortness of breath, cough or hemoptysis.  He denied having any fever or chills.  He has no nausea, vomiting, diarrhea or constipation.  He denied having any skin rash.  He is here today for evaluation before starting cycle #11.  MEDICAL HISTORY: Past Medical History:  Diagnosis Date  . Adenocarcinoma of right lung, stage 3 (HCHeritage Lake5/23/2018  . Adenocarcinoma of right lung, stage 3 (HCRanchitos East5/23/2018  . Anemia   . Arthritis    "hx right hip"  . Asthma    "when I was a child"  . Atrial fibrillation (HCC)    Amiodarone started 10/2011; Coumadin  . Automatic implantable  cardioverter-defibrillator in situ 10/03/2012   a. St. Jude ICD implantation 10/03/12.  . Chronic anticoagulation   . Chronic fatigue 10/18/2016  . Chronic fatigue 10/18/2016  . Chronic systolic heart failure (HCOak [place]   a. Echo 7/13: EF 25%;  b. echo 04/2012:  Mild LVH, EF 30-35%, Gr 1 DD, mild AI, mild MR, mild LAE  . COPD (chronic obstructive pulmonary disease) (HC[facility]  . Dyslipidemia   . Dysrhythmia   . Encounter for antineoplastic chemotherapy 08/24/2016  . Gout   . History of blood transfusion 10/15/2013   "don't know where the blood's going; HgB down to 5"  . Hyperlipidemia   . Hypertension   . Hypothyroidism   . NICM (nonischemic cardiomyopathy) (HC[facility]   [place]/14:  minimal CAD  . Obesity   . OSA on CPAP   . Tobacco abuse     ALLERGIES:  has No Known Allergies.  MEDICATIONS:  Current Outpatient Medications  Medication Sig Dispense Refill  . Acetaminophen (TYLENOL) 325 MG CAPS Take 325 mg by mouth daily as needed.    . Marland Kitchencetaminophen (TYLENOL) 500 MG tablet Take 1,000 mg by mouth every 6 (six) hours as needed for mild pain.    . Marland Kitchenlbuterol (PROVENTIL HFA;VENTOLIN HFA) 108 (90 Base) MCG/ACT inhaler Inhale 2 puffs into the lungs every 4 (four) hours as needed for wheezing or shortness of breath. 1 Inhaler 2  . amiodarone (PACERONE) 100 MG tablet Take 1 tablet (100 mg total) by mouth daily. Please make yearly appt with Dr. RoHarrington Challengeror February before anymore refills. 1st attempt 30 tablet 0  . atorvastatin (LIPITOR) 20 MG  tablet TAKE 1 TABLET BY MOUTH EVERY DAY 90 tablet 3  . buPROPion (WELLBUTRIN XL) 150 MG 24 hr tablet Take by mouth.    . carvedilol (COREG) 25 MG tablet TAKE 1 TABLET BY MOUTH TWICE A DAY WITH A MEAL 60 tablet 11  . CVS D3 2000 units CAPS Take 2,000 Units by mouth daily.   11  . feeding supplement, ENSURE ENLIVE, (ENSURE ENLIVE) LIQD Take 237 mLs by mouth 2 (two) times daily between meals. 237 mL 12  . ferrous gluconate (FERGON) 324 MG tablet Take 1 tablet (324 mg total) by  mouth 3 (three) times daily with meals. 90 tablet 8  . Fluticasone-Umeclidin-Vilant (TRELEGY ELLIPTA) 100-62.5-25 MCG/INH AEPB Inhale 1 puff into the lungs daily. 1 each 6  . furosemide (LASIX) 20 MG tablet Take 3 tabs (60 mg) every Tue and Sat, all other days take 2 tabs (40 mg) (Patient taking differently: Take 40-60 mg by mouth See admin instructions. Take 3 tabs (60 mg) every Tue and Sat, all other days take 2 tabs (40 mg)) 64 tablet 6  . hydrALAZINE (APRESOLINE) 100 MG tablet Take 100 mg by mouth 3 (three) times daily.   1  . levothyroxine (SYNTHROID, LEVOTHROID) 50 MCG tablet TAKE 1 TABLET (50 MCG TOTAL) BY MOUTH DAILY BEFORE BREAKFAST. 90 tablet 3  . lidocaine-prilocaine (EMLA) cream Apply generous amount to port site at least 1 hr prior to treatment.  DO NOT RUB IN. 30 g 1  . lisinopril (PRINIVIL,ZESTRIL) 40 MG tablet TAKE 1 TABLET BY MOUTH DAILY. 90 tablet 2  . magnesium oxide (MAG-OX) 400 (241.3 Mg) MG tablet Take 1 tablet (400 mg total) by mouth daily. 30 tablet 0  . potassium chloride SA (KLOR-CON M20) 20 MEQ tablet TAKE 1 TABLET BY MOUTH EVERY DAY *ON DAYS WHEN LASIX IS TAKEN, TAKE 2 TABLETS* NEED APPT FOR REFILLS 2nd attempt 45 tablet 0  . prochlorperazine (COMPAZINE) 10 MG tablet Take 1 tablet (10 mg total) by mouth every 6 (six) hours as needed for nausea or vomiting. (Patient not taking: Reported on 05/10/2017) 30 tablet 0  . rivaroxaban (XARELTO) 20 MG TABS tablet Take 1 tablet (20 mg total) by mouth daily. 90 tablet 2  . sildenafil (VIAGRA) 100 MG tablet Take 100 mg by mouth.    . spironolactone (ALDACTONE) 25 MG tablet TAKE 0.5 TABLETS (12.5 MG TOTAL) BY MOUTH DAILY. 45 tablet 2  . sucralfate (CARAFATE) 1 g tablet TAKE 1 TABLET BY MOUTH FOUR TIMES DAILY (5 MINUTES BEFORE MEALS AND AT BEDTIME) FOR ESOPHAGITIS 120 tablet 2  . ULORIC 40 MG tablet Take 1 tablet (40 mg total) by mouth daily. 30 tablet 0  . Vitamin D, Ergocalciferol, (DRISDOL) 50000 units CAPS capsule Take 50,000 Units by  mouth daily.     No current facility-administered medications for this visit.     SURGICAL HISTORY:  Past Surgical History:  Procedure Laterality Date  . CARDIAC DEFIBRILLATOR PLACEMENT  2014  . CARDIOVERSION  2011  . COLONOSCOPY WITH PROPOFOL Left 10/17/2013   Procedure: COLONOSCOPY WITH PROPOFOL;  Surgeon: Inda Castle, MD;  Location: [place];  Service: Endoscopy;  Laterality: Left;  . ESOPHAGOGASTRODUODENOSCOPY N/A 10/17/2013   Procedure: ESOPHAGOGASTRODUODENOSCOPY (EGD);  Surgeon: Inda Castle, MD;  Location: [place];  Service: Endoscopy;  Laterality: N/A;  . GIVENS CAPSULE STUDY N/A 10/29/2013   Procedure: GIVENS CAPSULE STUDY;  Surgeon: Inda Castle, MD;  Location: [facility];  Service: Endoscopy;  Laterality: N/A;  . IMPLANTABLE CARDIOVERTER DEFIBRILLATOR  IMPLANT Left 10/03/2012   Procedure: IMPLANTABLE CARDIOVERTER DEFIBRILLATOR IMPLANT;  Surgeon: Deboraha Sprang, MD;  Location: [facility] CATH LAB;  Service: Cardiovascular;  Laterality: Left;  . IR FLUORO GUIDE PORT INSERTION RIGHT  09/21/2016  . IR US GUIDE VASC ACCESS RIGHT  09/21/2016  . JOINT REPLACEMENT    . TONSILLECTOMY  1950's  . TOTAL HIP ARTHROPLASTY Right 11/25/1997  . VIDEO BRONCHOSCOPY WITH ENDOBRONCHIAL ULTRASOUND N/A 08/09/2016   Procedure: VIDEO BRONCHOSCOPY WITH ENDOBRONCHIAL ULTRASOUND;  Surgeon: Javier Glazier, MD;  Location: [facility] OR;  Service: Thoracic;  Laterality: N/A;    REVIEW OF SYSTEMS:  A comprehensive review of systems was negative.   PHYSICAL EXAMINATION: General appearance: alert, cooperative and no distress Head: Normocephalic, without obvious abnormality, atraumatic Neck: no adenopathy, no JVD, supple, symmetrical, trachea midline and thyroid not enlarged, symmetric, no tenderness/mass/nodules Lymph nodes: Cervical, supraclavicular, and axillary nodes normal. Resp: clear to auscultation bilaterally Back: symmetric, no curvature. ROM normal. No CVA tenderness. Cardio: regular rate and  rhythm, S1, S2 normal, no murmur, click, rub or gallop GI: soft, non-tender; bowel sounds normal; no masses,  no organomegaly Extremities: extremities normal, atraumatic, no cyanosis or edema  ECOG PERFORMANCE STATUS: 1 - Symptomatic but completely ambulatory  Blood pressure (!) 142/68, pulse 86, temperature 98.1 F (36.7 C), temperature source Oral, resp. rate 20, height 6' 1" (1.854 m), weight 291 lb 11.2 oz (132.3 kg), SpO2 100 %.  LABORATORY DATA: Lab Results  Component Value Date   WBC 4.7 05/24/2017   HGB 11.4 (L) 05/24/2017   HCT 35.8 (L) 05/24/2017   MCV 90.6 05/24/2017   PLT 125 (L) 05/24/2017      Chemistry      Component Value Date/Time   NA 143 05/10/2017 0902   NA 141 03/29/2017 1038   K 3.4 (L) 05/10/2017 0902   K 4.0 03/29/2017 1038   CL 108 05/10/2017 0902   CO2 28 05/10/2017 0902   CO2 27 03/29/2017 1038   BUN 26 05/10/2017 0902   BUN 28.6 (H) 03/29/2017 1038   CREATININE 1.54 (H) 05/10/2017 0902   CREATININE 1.6 (H) 03/29/2017 1038      Component Value Date/Time   CALCIUM 9.2 05/10/2017 0902   CALCIUM 9.3 03/29/2017 1038   ALKPHOS 106 05/10/2017 0902   ALKPHOS 105 03/29/2017 1038   AST 16 05/10/2017 0902   AST 18 03/29/2017 1038   ALT 11 05/10/2017 0902   ALT 13 03/29/2017 1038   BILITOT 0.3 05/10/2017 0902   BILITOT 0.62 03/29/2017 1038       RADIOGRAPHIC STUDIES: No results found.  ASSESSMENT AND PLAN:  This is a very pleasant 67 years old African-American male with a stage IIIa non-small cell lung cancer, adenocarcinoma.  The patient completed 6 weeks of concurrent chemoradiation with weekly carboplatin and paclitaxel and tolerated his treatment well except for odynophagia. The patient is currently on consolidation treatment with immunotherapy with Imfinzi (Durvalumab) status post 10 cycles.  He continues to tolerate this treatment fairly well with no concerning complaints. I recommended for him to proceed with cycle #11 today as a  scheduled. I will see him back for follow-up visit in 2 weeks for evaluation before starting cycle #12. He was advised to call immediately if he has any concerning symptoms in the interval. The patient voices understanding of current disease status and treatment options and is in agreement with the current care plan. All questions were answered. The patient knows to call the clinic with any problems, questions  or concerns. We can certainly see the patient much sooner if necessary.  Disclaimer: This note was dictated with voice recognition software. Similar sounding words can inadvertently be transcribed and may not be corrected upon review.

## 2017-05-28 ENCOUNTER — Encounter: Payer: Self-pay | Admitting: Adult Health

## 2017-05-28 ENCOUNTER — Ambulatory Visit (INDEPENDENT_AMBULATORY_CARE_PROVIDER_SITE_OTHER): Payer: Medicare Other | Admitting: Adult Health

## 2017-05-28 DIAGNOSIS — G4733 Obstructive sleep apnea (adult) (pediatric): Secondary | ICD-10-CM

## 2017-05-28 DIAGNOSIS — J430 Unilateral pulmonary emphysema [MacLeod's syndrome]: Secondary | ICD-10-CM | POA: Diagnosis not present

## 2017-05-28 DIAGNOSIS — C3411 Malignant neoplasm of upper lobe, right bronchus or lung: Secondary | ICD-10-CM | POA: Diagnosis not present

## 2017-05-28 NOTE — Assessment & Plan Note (Signed)
Stable  Cont follow up with Oncology .

## 2017-05-28 NOTE — Patient Instructions (Addendum)
Continue on TRELEGY daily , rinse after use Continue on CPAP at bedtime Do not drive a sleepy Check with PCP if you have had Prevnar 13 vaccine.  Follow up with Dr. Lamonte Sakai  In 6 months and As needed

## 2017-05-28 NOTE — Progress Notes (Signed)
@Patient  ID: Jason Russell, male    DOB: June 21, 1950, 67 y.o.   MRN: [id number]  Chief Complaint  Patient presents with  . Follow-up    COPD     Referring provider: Lucianne Lei, MD  HPI: 67 year old male former smoker followed for severe COPD, severe obstructive sleep apnea, and non-small cell lung cancer  Patient has nonischemic heart disease, A. Fib. S/p ICD , on Amiodarone .   TEST   PFT 07/31/16: FVC 1.84 L (41%) FEV1 1.14 L (42%) FEV1/FVC 0.77 FEF 25-75 1.17 L (38%) positive bronchodilator response TLC 5.76 L (75%) RV 140% ERV 48% DLCO uncorrected 52%   IMAGING PET CT 07/31/16 Markedly hypermetabolic posterior right upper lobe pulmonary lesion consistent with malignancy and hypermetabolic mediastinal lymph node metastases.  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 This obscures the regional right upper lobe apical lung nodule. No obvious nodular growth or recurrent thoracic adenopathy.  PATHOLOGY FNA RUL 09/06/16:  Adenocarcinoma  EBUS 4R & 7 08/09/16:  Adenocarcinoma   Vaccines : PVX , Influenza are utd.   05/28/2017 Follow up : COPD , OSA , Lung Cancer  Patient has severe COPD.  Says he gets winded with activity.  Says his breathing is at baseline with no flare of cough or wheezing.  Patient remains on TRELEGY daily .  On ACE inhibitor , denies flare of cough .  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  He was treated with XRT in July 2018.  Tx w/ Carboplatin and Paclitaxen 2018 . Currently on immunotherapy with Imfinzi began 12/2016 . CT chest March 24, 2017 showed progressive right apical and paramediastinal radiation fibrosis/pneumonitis.  No obvious nodular growth or recurrent  thoracic adenopathy. Says tolerating treatments well , no known side effects.   Patient has severe sleep apnea.  Patient is on CPAP at bedtime.  Patient says he feels rested.  He is doing well on CPAP.  Download shows excellent compliance with average usage at 10 hours.  Patient is on auto CPAP 5-20 cm H2O.  AHI is 2.9.  Positive leaks. Feels rested.      No Known Allergies  Immunization History  Administered Date(s) Administered  . Influenza Split 10/27/2016  . Influenza,inj,Quad PF,6+ Mos 12/08/2015  . Pneumococcal Polysaccharide-23 10/16/2013  . Pneumococcal-Unspecified 10/27/2016    Past Medical History:  Diagnosis Date  . Adenocarcinoma of right lung, stage 3 ([name]) 08/23/2016  . Adenocarcinoma of right lung, stage 3 ([name]) 08/23/2016  . Anemia   . Arthritis    "hx right hip"  . Asthma    "when I was a child"  . Atrial fibrillation (HCC)    Amiodarone started 10/2011; Coumadin  . 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 St. Jude ICD implantation 10/03/12.  . Chronic anticoagulation   . Chronic fatigue 10/18/2016  . Chronic fatigue 10/18/2016  . Chronic systolic heart failure ([place])    a. Echo 7/13: EF 25%;  b. echo 04/2012:  Mild LVH, EF 30-35%, Gr 1 DD, mild AI, mild MR, mild LAE  . COPD (chronic obstructive pulmonary disease) ([place])   . Dyslipidemia   . Dysrhythmia   . Encounter for antineoplastic chemotherapy 08/24/2016  . Gout   . History of blood transfusion 10/15/2013   "  don't know where the blood's going; HgB down to 5"  . Hyperlipidemia   . Hypertension   . Hypothyroidism   . NICM (nonischemic cardiomyopathy) ([place])    [place] 4/14:  minimal CAD  . Obesity   . OSA on CPAP   . Tobacco abuse     Tobacco History: Social History   Tobacco Use  Smoking Status Former Smoker  . Packs/day: 0.25  . Years: 45.00  . Pack years: 11.25  . Types: Cigarettes  . Last attempt to quit: 07/11/2016  . Years since quitting: 0.8  Smokeless Tobacco  Never Used   Counseling given: Not Answered   Outpatient Encounter Medications as of 05/28/2017  Medication Sig  . Acetaminophen (TYLENOL) 325 MG CAPS Take 325 mg by mouth daily as needed.  [name] acetaminophen (TYLENOL) 500 MG tablet Take 1,000 mg by mouth every 6 (six) hours as needed for mild pain.  [name] albuterol (PROVENTIL HFA;VENTOLIN HFA) 108 (90 Base) MCG/ACT inhaler Inhale 2 puffs into the lungs every 4 (four) hours as needed for wheezing or shortness of breath.  [name] amiodarone (PACERONE) 100 MG tablet Take 1 tablet (100 mg total) by mouth daily. Please make yearly appt with Dr. Harrington Challenger for February before anymore refills. 1st attempt  . atorvastatin (LIPITOR) 20 MG tablet TAKE 1 TABLET BY MOUTH EVERY DAY  . carvedilol (COREG) 25 MG tablet TAKE 1 TABLET BY MOUTH TWICE A DAY WITH A MEAL  . CVS D3 2000 units CAPS Take 2,000 Units by mouth daily.   . feeding supplement, ENSURE ENLIVE, (ENSURE ENLIVE) LIQD Take 237 mLs by mouth 2 (two) times daily between meals.  . ferrous gluconate (FERGON) 324 MG tablet Take 1 tablet (324 mg total) by mouth 3 (three) times daily with meals.  . Fluticasone-Umeclidin-Vilant (TRELEGY ELLIPTA) 100-62.5-25 MCG/INH AEPB Inhale 1 puff into the lungs daily.  . furosemide (LASIX) 20 MG tablet Take 3 tabs (60 mg) every Tue and Sat, all other days take 2 tabs (40 mg) (Patient taking differently: Take 40-60 mg by mouth See admin instructions. Take 3 tabs (60 mg) every Tue and Sat, all other days take 2 tabs (40 mg))  . hydrALAZINE (APRESOLINE) 100 MG tablet Take 100 mg by mouth 3 (three) times daily.   [name] levothyroxine (SYNTHROID, LEVOTHROID) 50 MCG tablet TAKE 1 TABLET (50 MCG TOTAL) BY MOUTH DAILY BEFORE BREAKFAST.  [name] lidocaine-prilocaine (EMLA) cream Apply generous amount to port site at least 1 hr prior to treatment.  DO NOT RUB IN.  [name] lisinopril (PRINIVIL,ZESTRIL) 40 MG tablet TAKE 1 TABLET BY MOUTH DAILY.  . magnesium oxide (MAG-OX) 400 (241.3 Mg) MG tablet Take 1 tablet  (400 mg total) by mouth daily.  . potassium chloride SA (KLOR-CON M20) 20 MEQ tablet TAKE 1 TABLET BY MOUTH EVERY DAY *ON DAYS WHEN LASIX IS TAKEN, TAKE 2 TABLETS* NEED APPT FOR REFILLS 2nd attempt  . prochlorperazine (COMPAZINE) 10 MG tablet Take 1 tablet (10 mg total) by mouth every 6 (six) hours as needed for nausea or vomiting.  . rivaroxaban (XARELTO) 20 MG TABS tablet Take 1 tablet (20 mg total) by mouth daily.  . sildenafil (VIAGRA) 100 MG tablet Take 100 mg by mouth.  . spironolactone (ALDACTONE) 25 MG tablet TAKE 0.5 TABLETS (12.5 MG TOTAL) BY MOUTH DAILY.  [name] ULORIC 40 MG tablet Take 1 tablet (40 mg total) by mouth daily.  . sucralfate (CARAFATE) 1 g tablet TAKE 1 TABLET BY MOUTH FOUR TIMES DAILY (5 MINUTES BEFORE MEALS AND AT  BEDTIME) FOR ESOPHAGITIS (Patient not taking: Reported on 05/28/2017)  . [DISCONTINUED] buPROPion (WELLBUTRIN XL) 150 MG 24 hr tablet Take by mouth.  . [DISCONTINUED] Vitamin D, Ergocalciferol, (DRISDOL) 50000 units CAPS capsule Take 50,000 Units by mouth daily.   No facility-administered encounter medications on file as of 05/28/2017.      Review of Systems  Constitutional:   No  weight loss, night sweats,  Fevers, chills,  +fatigue, or  lassitude.  HEENT:   No headaches,  Difficulty swallowing,  Tooth/dental problems, or  Sore throat,                No sneezing, itching, ear ache, nasal congestion, post nasal drip,   CV:  No chest pain,  Orthopnea, PND, +swelling in lower extremities,  No anasarca, dizziness, palpitations, syncope.   GI  No heartburn, indigestion, abdominal pain, nausea, vomiting, diarrhea, change in bowel habits, loss of appetite, bloody stools.   Resp: No shortness of breath with exertion or at rest.  No excess mucus, no productive cough,  No non-productive cough,  No coughing up of blood.  No change in color of mucus.  No wheezing.  No chest wall deformity  Skin: no rash or lesions.  GU: no dysuria, change in color of urine, no  urgency or frequency.  No flank pain, no hematuria   MS:  No joint pain or swelling.  No decreased range of motion.  No back pain.    Physical Exam  BP 134/68 (BP Location: Left Arm, Cuff Size: Normal)   Pulse 81   Ht 6\' 1"  (1.854 m)   Wt 292 lb (132.5 kg)   SpO2 100%   BMI 38.52 kg/m   GEN: A/Ox3; pleasant , NAD, obese  HEENT:  [place]/AT,  EACs-clear, TMs-wnl, NOSE-clear, THROAT-clear, no lesions, no postnasal drip or exudate noted.  Class II-III MP. airway  NECK:  Supple w/ fair ROM; no JVD; normal carotid impulses w/o bruits; no thyromegaly or nodules palpated; no lymphadenopathy.    RESP  Clear  P & A; w/o, wheezes/ rales/ or rhonchi. no accessory muscle use, no dullness to percussion  CARD:  RRR, no m/r/g, TR peripheral edema, pulses intact, no cyanosis or clubbing.  GI:   Soft & nt; nml bowel sounds; no organomegaly or masses detected.   Musco: Warm bil, no deformities or joint swelling noted.   Neuro: alert, no focal deficits noted.    Skin: Warm, no lesions or rashes    Lab Results:  CBC    Component Value Date/Time   WBC 4.7 05/24/2017 0809   RBC 3.95 (L) 05/24/2017 0809   HGB 11.4 (L) 05/24/2017 0809   HGB 11.8 (L) 03/29/2017 1038   HCT 35.8 (L) 05/24/2017 0809   HCT 36.8 (L) 03/29/2017 1038   PLT 125 (L) 05/24/2017 0809   PLT 135 (L) 03/29/2017 1038   PLT 182 07/21/2016 1429   MCV 90.6 05/24/2017 0809   MCV 90.2 03/29/2017 1038   MCH 28.9 05/24/2017 0809   MCHC 31.8 (L) 05/24/2017 0809   RDW 15.5 (H) 05/24/2017 0809   RDW 16.6 (H) 03/29/2017 1038   LYMPHSABS 0.5 (L) 05/24/2017 0809   LYMPHSABS 0.6 (L) 03/29/2017 1038   MONOABS 0.8 05/24/2017 0809   MONOABS 0.8 03/29/2017 1038   EOSABS 0.1 05/24/2017 0809   EOSABS 0.1 03/29/2017 1038   EOSABS 0.1 07/21/2016 1429   BASOSABS 0.0 05/24/2017 0809   BASOSABS 0.0 03/29/2017 1038    BMET    Component Value  Date/Time   NA 142 05/24/2017 0809   NA 141 03/29/2017 1038   K 4.2 05/24/2017 0809   K  4.0 03/29/2017 1038   CL 109 05/24/2017 0809   CO2 25 05/24/2017 0809   CO2 27 03/29/2017 1038   GLUCOSE 101 05/24/2017 0809   GLUCOSE 90 03/29/2017 1038   BUN 25 05/24/2017 0809   BUN 28.6 (H) 03/29/2017 1038   CREATININE 1.60 (H) 05/24/2017 0809   CREATININE 1.6 (H) 03/29/2017 1038   CALCIUM 9.5 05/24/2017 0809   CALCIUM 9.3 03/29/2017 1038   GFRNONAA 44 (L) 05/24/2017 0809   GFRAA 51 (L) 05/24/2017 0809    BNP    Component Value Date/Time   BNP 381.1 (H) 01/07/2017 2029   BNP 366.4 (H) 02/10/2016 1446    ProBNP    Component Value Date/Time   PROBNP 1,996 (H) 06/26/2016 1129   PROBNP 29.0 10/15/2013 1511    Imaging: No results found.   Assessment & Plan:   Obstructive sleep apnea Good control on CPAP  Plan  Patient Instructions  Continue on TRELEGY daily , rinse after use Continue on CPAP at bedtime Do not drive a sleepy Follow up with Dr. Lamonte Sakai  In 6 months and As needed        COPD (chronic obstructive pulmonary disease) ([place]) Controlled on current regimen  Plan  Patient Instructions  Continue on TRELEGY daily , rinse after use Continue on CPAP at bedtime Do not drive a sleepy Follow up with Dr. Lamonte Sakai  In 6 months and As needed        Primary cancer of right upper lobe of lung ([place]) Stable  Cont follow up with Oncology .      Rexene Edison, NP 05/28/2017

## 2017-05-28 NOTE — Assessment & Plan Note (Signed)
Controlled on current regimen  Plan  Patient Instructions  Continue on TRELEGY daily , rinse after use Continue on CPAP at bedtime Do not drive a sleepy Follow up with Dr. Lamonte Sakai  In 6 months and As needed

## 2017-05-28 NOTE — Assessment & Plan Note (Signed)
Good control on CPAP  Plan  Patient Instructions  Continue on TRELEGY daily , rinse after use Continue on CPAP at bedtime Do not drive a sleepy Follow up with Dr. Lamonte Sakai  In 6 months and As needed

## 2017-05-29 ENCOUNTER — Ambulatory Visit (INDEPENDENT_AMBULATORY_CARE_PROVIDER_SITE_OTHER): Payer: Medicare Other | Admitting: *Deleted

## 2017-05-29 DIAGNOSIS — I428 Other cardiomyopathies: Secondary | ICD-10-CM

## 2017-05-29 NOTE — Progress Notes (Signed)
Remote ICD transmission.   

## 2017-05-31 ENCOUNTER — Encounter: Payer: Self-pay | Admitting: Cardiology

## 2017-06-03 ENCOUNTER — Other Ambulatory Visit: Payer: Self-pay | Admitting: Internal Medicine

## 2017-06-06 ENCOUNTER — Other Ambulatory Visit: Payer: Self-pay | Admitting: Internal Medicine

## 2017-06-07 ENCOUNTER — Inpatient Hospital Stay (HOSPITAL_BASED_OUTPATIENT_CLINIC_OR_DEPARTMENT_OTHER): Payer: Medicare Other | Admitting: Internal Medicine

## 2017-06-07 ENCOUNTER — Encounter: Payer: Self-pay | Admitting: Internal Medicine

## 2017-06-07 ENCOUNTER — Inpatient Hospital Stay: Payer: Medicare Other | Attending: Internal Medicine

## 2017-06-07 ENCOUNTER — Inpatient Hospital Stay: Payer: Medicare Other

## 2017-06-07 DIAGNOSIS — E039 Hypothyroidism, unspecified: Secondary | ICD-10-CM | POA: Diagnosis not present

## 2017-06-07 DIAGNOSIS — E669 Obesity, unspecified: Secondary | ICD-10-CM | POA: Insufficient documentation

## 2017-06-07 DIAGNOSIS — Z923 Personal history of irradiation: Secondary | ICD-10-CM

## 2017-06-07 DIAGNOSIS — E785 Hyperlipidemia, unspecified: Secondary | ICD-10-CM | POA: Diagnosis not present

## 2017-06-07 DIAGNOSIS — I5022 Chronic systolic (congestive) heart failure: Secondary | ICD-10-CM

## 2017-06-07 DIAGNOSIS — M109 Gout, unspecified: Secondary | ICD-10-CM | POA: Insufficient documentation

## 2017-06-07 DIAGNOSIS — R5382 Chronic fatigue, unspecified: Secondary | ICD-10-CM | POA: Diagnosis not present

## 2017-06-07 DIAGNOSIS — I110 Hypertensive heart disease with heart failure: Secondary | ICD-10-CM

## 2017-06-07 DIAGNOSIS — J449 Chronic obstructive pulmonary disease, unspecified: Secondary | ICD-10-CM | POA: Insufficient documentation

## 2017-06-07 DIAGNOSIS — Z9221 Personal history of antineoplastic chemotherapy: Secondary | ICD-10-CM | POA: Insufficient documentation

## 2017-06-07 DIAGNOSIS — M1990 Unspecified osteoarthritis, unspecified site: Secondary | ICD-10-CM | POA: Insufficient documentation

## 2017-06-07 DIAGNOSIS — I4891 Unspecified atrial fibrillation: Secondary | ICD-10-CM | POA: Insufficient documentation

## 2017-06-07 DIAGNOSIS — Z79899 Other long term (current) drug therapy: Secondary | ICD-10-CM

## 2017-06-07 DIAGNOSIS — Z5112 Encounter for antineoplastic immunotherapy: Secondary | ICD-10-CM | POA: Diagnosis not present

## 2017-06-07 DIAGNOSIS — Z9989 Dependence on other enabling machines and devices: Secondary | ICD-10-CM | POA: Insufficient documentation

## 2017-06-07 DIAGNOSIS — C3411 Malignant neoplasm of upper lobe, right bronchus or lung: Secondary | ICD-10-CM | POA: Insufficient documentation

## 2017-06-07 DIAGNOSIS — G4733 Obstructive sleep apnea (adult) (pediatric): Secondary | ICD-10-CM | POA: Diagnosis not present

## 2017-06-07 DIAGNOSIS — R21 Rash and other nonspecific skin eruption: Secondary | ICD-10-CM

## 2017-06-07 DIAGNOSIS — C3490 Malignant neoplasm of unspecified part of unspecified bronchus or lung: Secondary | ICD-10-CM

## 2017-06-07 LAB — CBC WITH DIFFERENTIAL/PLATELET
Basophils Absolute: 0 10*3/uL (ref 0.0–0.1)
Basophils Relative: 0 %
Eosinophils Absolute: 0.1 10*3/uL (ref 0.0–0.5)
Eosinophils Relative: 2 %
HCT: 35.3 % — ABNORMAL LOW (ref 38.4–49.9)
HEMOGLOBIN: 11.3 g/dL — AB (ref 13.0–17.1)
LYMPHS ABS: 0.5 10*3/uL — AB (ref 0.9–3.3)
LYMPHS PCT: 11 %
MCH: 29.2 pg (ref 27.2–33.4)
MCHC: 32 g/dL (ref 32.0–36.0)
MCV: 91.2 fL (ref 79.3–98.0)
MONOS PCT: 19 %
Monocytes Absolute: 0.9 10*3/uL (ref 0.1–0.9)
NEUTROS PCT: 68 %
Neutro Abs: 3.4 10*3/uL (ref 1.5–6.5)
Platelets: 136 10*3/uL — ABNORMAL LOW (ref 140–400)
RBC: 3.87 MIL/uL — AB (ref 4.20–5.82)
RDW: 15.6 % — ABNORMAL HIGH (ref 11.0–14.6)
WBC: 5 10*3/uL (ref 4.0–10.3)

## 2017-06-07 LAB — COMPREHENSIVE METABOLIC PANEL
ALT: 14 U/L (ref 0–55)
AST: 15 U/L (ref 5–34)
Albumin: 3.5 g/dL (ref 3.5–5.0)
Alkaline Phosphatase: 95 U/L (ref 40–150)
Anion gap: 7 (ref 3–11)
BUN: 29 mg/dL — ABNORMAL HIGH (ref 7–26)
CHLORIDE: 108 mmol/L (ref 98–109)
CO2: 28 mmol/L (ref 22–29)
Calcium: 9.5 mg/dL (ref 8.4–10.4)
Creatinine, Ser: 1.33 mg/dL — ABNORMAL HIGH (ref 0.70–1.30)
GFR calc Af Amer: >60 mL/min (ref >60)
GFR, EST NON AFRICAN AMERICAN: 55 mL/min — AB (ref >60)
Glucose, Bld: 90 mg/dL (ref 70–140)
POTASSIUM: 4 mmol/L (ref 3.5–5.1)
SODIUM: 143 mmol/L (ref 136–145)
Total Bilirubin: 0.5 mg/dL (ref 0.2–1.2)
Total Protein: 6.6 g/dL (ref 6.4–8.3)

## 2017-06-07 MED ORDER — HEPARIN SOD (PORK) LOCK FLUSH 100 UNIT/ML IV SOLN
500.0000 [IU] | Freq: Once | INTRAVENOUS | Status: AC | PRN
  Administered 2017-06-07: 500 [IU]
  Filled 2017-06-07: qty 5

## 2017-06-07 MED ORDER — SODIUM CHLORIDE 0.9% FLUSH
10.0000 mL | INTRAVENOUS | Status: DC | PRN
  Administered 2017-06-07: 10 mL
  Filled 2017-06-07: qty 10

## 2017-06-07 MED ORDER — DURVALUMAB 500 MG/10ML IV SOLN
9.5000 mg/kg | Freq: Once | INTRAVENOUS | Status: AC
  Administered 2017-06-07: 1240 mg via INTRAVENOUS
  Filled 2017-06-07: qty 20

## 2017-06-07 MED ORDER — SODIUM CHLORIDE 0.9 % IV SOLN
Freq: Once | INTRAVENOUS | Status: AC
  Administered 2017-06-07: 11:00:00 via INTRAVENOUS

## 2017-06-07 NOTE — Patient Instructions (Signed)
[facility] Discharge Instructions for Patients Receiving Chemotherapy  Today you received the following chemotherapy agents: Imfinzi.  To help prevent nausea and vomiting after your treatment, we encourage you to take your nausea medication as directed.   If you develop nausea and vomiting that is not controlled by your nausea medication, call the clinic.   BELOW ARE SYMPTOMS THAT SHOULD BE REPORTED IMMEDIATELY:  *FEVER GREATER THAN 100.5 F  *CHILLS WITH OR WITHOUT FEVER  NAUSEA AND VOMITING THAT IS NOT CONTROLLED WITH YOUR NAUSEA MEDICATION  *UNUSUAL SHORTNESS OF BREATH  *UNUSUAL BRUISING OR BLEEDING  TENDERNESS IN MOUTH AND THROAT WITH OR WITHOUT PRESENCE OF ULCERS  *URINARY PROBLEMS  *BOWEL PROBLEMS  UNUSUAL RASH Items with * indicate a potential emergency and should be followed up as soon as possible.  Feel free to call the clinic should you have any questions or concerns. The clinic phone number is [phone].  Please show the CHEMO ALERT CARD at check-in to the Emergency Department and triage nurse.   

## 2017-06-07 NOTE — Progress Notes (Signed)
[facility] [phone]   [phone]  OFFICE PROGRESS NOTE  Lucianne Lei, MD [address] [place] [address]  DIAGNOSIS: Stage IIIA (T1a, N2, M0) non-small cell lung cancer, adenocarcinoma presented with right upper lobe lung nodule in addition to mediastinal lymphadenopathy diagnosed in March 2018.  Biomarker Findings Tumor Mutational Burden - TMB-Intermediate (8 Muts/Mb) Microsatellite Status - MS-Stable Genomic Findings For a complete list of the genes assayed, please refer to the Appendix. ERBB2 amplification - equivocal? DNMT3A K467f*192 FUBP1 Q40* KEAP1 [id number] TP53 C275F 7 Disease relevant genes with no reportable alterations: EGFR, KRAS, ALK, BRAF, MET, RET, ROS1   PRIOR THERAPY: course of concurrent chemoradiation with weekly carboplatin for AUC of 2 and paclitaxel 45 MG/M2. Status post 6 cycles. Last cycle was given 10/16/2016.  CURRENT THERAPY:  Consolidation treatment with immunotherapy with Imfinzi (Durvalumab) 10 MG/M2 every 2 weeks. First dose 12/21/2016.  Status post 11 cycles.  INTERVAL HISTORY: Jason Huckeby89.o. male returns to the clinic today for follow-up visit.  The patient is feeling fine today with no specific complaints except for spot of his skin rash on his left forearm started a week ago.  It is macular in appearance.  He denied having any chest pain, shortness of breath, cough or hemoptysis.  He denied having any fever or chills.  He has no nausea, vomiting, diarrhea or constipation.  He is here today for evaluation before starting cycle #12.  MEDICAL HISTORY: Past Medical History:  Diagnosis Date  . Adenocarcinoma of right lung, stage 3 ([facility]5/23/2018  . Adenocarcinoma of right lung, stage 3 ([facility]/23/2018  . Anemia   . Arthritis    "hx right hip"  . Asthma    "when I was a child"  . Atrial fibrillation (HCC)    Amiodarone started 10/2011; Coumadin  . Automatic implantable  cardioverter-defibrillator in situ 10/03/2012   a. St. Jude ICD implantation 10/03/12.  . Chronic anticoagulation   . Chronic fatigue 10/18/2016  . Chronic fatigue 10/18/2016  . Chronic systolic heart failure ([facility]   a. Echo 7/13: EF 25%;  b. echo 04/2012:  Mild LVH, EF 30-35%, Gr 1 DD, mild AI, mild MR, mild LAE  . COPD (chronic obstructive pulmonary disease) ([facility]  . Dyslipidemia   . Dysrhythmia   . Encounter for antineoplastic chemotherapy 08/24/2016  . Gout   . History of blood transfusion 10/15/2013   "don't know where the blood's going; HgB down to 5"  . Hyperlipidemia   . Hypertension   . Hypothyroidism   . NIC[facility] (nonischemic cardiomyopathy) ([facility]   [facility]14:  minimal CAD  . Obesity   . OSA on CPAP   . Tobacco abuse     ALLERGIES:  has No Known Allergies.  MEDICATIONS:  Current Outpatient Medications  Medication Sig Dispense Refill  . Acetaminophen (TYLENOL) 325 MG CAPS Take 325 mg by mouth daily as needed.    . Marland Kitchencetaminophen (TYLENOL) 500 MG tablet Take 1,000 mg by mouth every 6 (six) hours as needed for mild pain.    . Marland Kitchenlbuterol (PROVENTIL HFA;VENTOLIN HFA) 108 (90 Base) MCG/ACT inhaler Inhale 2 puffs into the lungs every 4 (four) hours as needed for wheezing or shortness of breath. 1 Inhaler 2  . amiodarone (PACERONE) 100 MG tablet Take 1 tablet (100 mg total) by mouth daily. Please call and schedule an appt with Dr. RoHarrington Challengeror further refill. 3rd attrmpt 15 tablet 0  .  atorvastatin (LIPITOR) 20 MG tablet TAKE 1 TABLET BY MOUTH EVERY DAY 90 tablet 3  . carvedilol (COREG) 25 MG tablet TAKE 1 TABLET BY MOUTH TWICE A DAY WITH A MEAL 60 tablet 11  . CVS D3 2000 units CAPS Take 2,000 Units by mouth daily.   11  . feeding supplement, ENSURE ENLIVE, (ENSURE ENLIVE) LIQD Take 237 mLs by mouth 2 (two) times daily between meals. 237 mL 12  . ferrous gluconate (FERGON) 324 MG tablet Take 1 tablet (324 mg total) by mouth 3 (three) times daily with meals. 90 tablet 8  .  Fluticasone-Umeclidin-Vilant (TRELEGY ELLIPTA) 100-62.5-25 MCG/INH AEPB Inhale 1 puff into the lungs daily. 1 each 6  . furosemide (LASIX) 20 MG tablet TAKE 3 TABLETS (60 MG) EVERY TUE AND SAT, ALL OTHER DAYS TAKE 2 TABLETS (40 MG). 64 tablet 7  . hydrALAZINE (APRESOLINE) 100 MG tablet Take 100 mg by mouth 3 (three) times daily.   1  . levothyroxine (SYNTHROID, LEVOTHROID) 50 MCG tablet TAKE 1 TABLET (50 MCG TOTAL) BY MOUTH DAILY BEFORE BREAKFAST. 90 tablet 3  . lidocaine-prilocaine (EMLA) cream Apply generous amount to port site at least 1 hr prior to treatment.  DO NOT RUB IN. 30 g 1  . lisinopril (PRINIVIL,ZESTRIL) 40 MG tablet TAKE 1 TABLET BY MOUTH DAILY. 90 tablet 2  . magnesium oxide (MAG-OX) 400 (241.3 Mg) MG tablet Take 1 tablet (400 mg total) by mouth daily. 30 tablet 0  . potassium chloride SA (KLOR-CON M20) 20 MEQ tablet TAKE 1 TABLET BY MOUTH EVERY DAY *ON DAYS WHEN LASIX IS TAKEN, TAKE 2 TABLETS*  Dr. Harrington Challenger for further refills. 3rd attempt 30 tablet 0  . prochlorperazine (COMPAZINE) 10 MG tablet Take 1 tablet (10 mg total) by mouth every 6 (six) hours as needed for nausea or vomiting. 30 tablet 0  . rivaroxaban (XARELTO) 20 MG TABS tablet Take 1 tablet (20 mg total) by mouth daily. 90 tablet 2  . sildenafil (VIAGRA) 100 MG tablet Take 100 mg by mouth.    . spironolactone (ALDACTONE) 25 MG tablet TAKE 0.5 TABLETS (12.5 MG TOTAL) BY MOUTH DAILY. 45 tablet 2  . sucralfate (CARAFATE) 1 g tablet TAKE 1 TABLET BY MOUTH FOUR TIMES DAILY (5 MINUTES BEFORE MEALS AND AT BEDTIME) FOR ESOPHAGITIS (Patient not taking: Reported on 05/28/2017) 120 tablet 2  . ULORIC 40 MG tablet Take 1 tablet (40 mg total) by mouth daily. 30 tablet 0   No current facility-administered medications for this visit.     SURGICAL HISTORY:  Past Surgical History:  Procedure Laterality Date  . CARDIAC DEFIBRILLATOR PLACEMENT  2014  . CARDIOVERSION  2011  . COLONOSCOPY WITH PROPOFOL Left 10/17/2013   Procedure:  COLONOSCOPY WITH PROPOFOL;  Surgeon: Inda Castle, MD;  Location: [facility];  Service: Endoscopy;  Laterality: Left;  . ESOPHAGOGASTRODUODENOSCOPY N/A 10/17/2013   Procedure: ESOPHAGOGASTRODUODENOSCOPY (EGD);  Surgeon: Inda Castle, MD;  Location: [facility];  Service: Endoscopy;  Laterality: N/A;  . GIVENS CAPSULE STUDY N/A 10/29/2013   Procedure: GIVENS CAPSULE STUDY;  Surgeon: Inda Castle, MD;  Location: [facility] ENDOSCOPY;  Service: Endoscopy;  Laterality: N/A;  . IMPLANTABLE CARDIOVERTER DEFIBRILLATOR IMPLANT Left 10/03/2012   Procedure: IMPLANTABLE CARDIOVERTER DEFIBRILLATOR IMPLANT;  Surgeon: Deboraha Sprang, MD;  Location: [facility] CATH LAB;  Service: Cardiovascular;  Laterality: Left;  . IR FLUORO GUIDE PORT INSERTION RIGHT  09/21/2016  . IR US GUIDE VASC ACCESS RIGHT  09/21/2016  . JOINT REPLACEMENT    .  TONSILLECTOMY  1950's  . TOTAL HIP ARTHROPLASTY Right 11/25/1997  . VIDEO BRONCHOSCOPY WITH ENDOBRONCHIAL ULTRASOUND N/A 08/09/2016   Procedure: VIDEO BRONCHOSCOPY WITH ENDOBRONCHIAL ULTRASOUND;  Surgeon: Javier Glazier, MD;  Location: [facility] OR;  Service: Thoracic;  Laterality: N/A;    REVIEW OF SYSTEMS:  A comprehensive review of systems was negative except for: Integument/breast: positive for rash   PHYSICAL EXAMINATION: General appearance: alert, cooperative and no distress Head: Normocephalic, without obvious abnormality, atraumatic Neck: no adenopathy, no JVD, supple, symmetrical, trachea midline and thyroid not enlarged, symmetric, no tenderness/mass/nodules Lymph nodes: Cervical, supraclavicular, and axillary nodes normal. Resp: clear to auscultation bilaterally Back: symmetric, no curvature. ROM normal. No CVA tenderness. Cardio: regular rate and rhythm, S1, S2 normal, no murmur, click, rub or gallop GI: soft, non-tender; bowel sounds normal; no masses,  no organomegaly Extremities: extremities normal, atraumatic, no cyanosis or edema  ECOG PERFORMANCE STATUS: 1 - Symptomatic  but completely ambulatory  Blood pressure (!) 158/81, pulse 76, temperature 98.5 F (36.9 C), temperature source Oral, resp. rate 20, height '6\' 1"'  (1.854 m), weight 294 lb 12.8 oz (133.7 kg), SpO2 100 %.  LABORATORY DATA: Lab Results  Component Value Date   WBC 5.0 06/07/2017   HGB 11.3 (L) 06/07/2017   HCT 35.3 (L) 06/07/2017   MCV 91.2 06/07/2017   PLT 136 (L) 06/07/2017      Chemistry      Component Value Date/Time   NA 143 06/07/2017 0953   NA 141 03/29/2017 1038   K 4.0 06/07/2017 0953   K 4.0 03/29/2017 1038   CL 108 06/07/2017 0953   CO2 28 06/07/2017 0953   CO2 27 03/29/2017 1038   BUN 29 (H) 06/07/2017 0953   BUN 28.6 (H) 03/29/2017 1038   CREATININE 1.33 (H) 06/07/2017 0953   CREATININE 1.6 (H) 03/29/2017 1038      Component Value Date/Time   CALCIUM 9.5 06/07/2017 0953   CALCIUM 9.3 03/29/2017 1038   ALKPHOS 95 06/07/2017 0953   ALKPHOS 105 03/29/2017 1038   AST 15 06/07/2017 0953   AST 18 03/29/2017 1038   ALT 14 06/07/2017 0953   ALT 13 03/29/2017 1038   BILITOT 0.5 06/07/2017 0953   BILITOT 0.62 03/29/2017 1038       RADIOGRAPHIC STUDIES: No results found.  ASSESSMENT AND PLAN:  This is a very pleasant 67 years old African-American male with a stage IIIa non-small cell lung cancer, adenocarcinoma.  The patient completed 6 weeks of concurrent chemoradiation with weekly carboplatin and paclitaxel and tolerated his treatment well except for odynophagia. The patient is currently on consolidation treatment with immunotherapy with Imfinzi (Durvalumab) status post 11 cycles.  The patient continues to do fine with no concerning complaints. I recommended for him to proceed with cycle #12 today as a scheduled. I will see the patient back for follow-up visit in 2 weeks for evaluation after repeating CT scan of the chest for restaging of his disease. He was advised to call immediately if he has any concerning symptoms in the interval. The patient voices  understanding of current disease status and treatment options and is in agreement with the current care plan. All questions were answered. The patient knows to call the clinic with any problems, questions or concerns. We can certainly see the patient much sooner if necessary.  Disclaimer: This note was dictated with voice recognition software. Similar sounding words can inadvertently be transcribed and may not be corrected upon review.

## 2017-06-08 ENCOUNTER — Other Ambulatory Visit: Payer: Self-pay | Admitting: Internal Medicine

## 2017-06-08 ENCOUNTER — Ambulatory Visit (INDEPENDENT_AMBULATORY_CARE_PROVIDER_SITE_OTHER): Payer: Medicare Other

## 2017-06-08 DIAGNOSIS — I5022 Chronic systolic (congestive) heart failure: Secondary | ICD-10-CM

## 2017-06-08 DIAGNOSIS — Z95810 Presence of automatic (implantable) cardiac defibrillator: Secondary | ICD-10-CM

## 2017-06-08 NOTE — Progress Notes (Signed)
EPIC Encounter for ICM Monitoring  Patient Name: Jason Russell is a 67 y.o. male Date: 06/08/2017 Primary Care Physican: Lucianne Lei, MD Primary Cardiologist:Ross Electrophysiologist: Faustino Congress FMBWGY:659 lbs      Heart Failure questions reviewed, pt asymptomatic.   Thoracic impedance normal.  Prescribed dosage: Furosemide 20 mg 3 tablets (60 mg total) every Tuesday and Saturday and 40 mg all the other days. Potassium 20 mEq 1 tablet every day and on days when lasix is taken, take 2 tablets  Labs: 04/26/2017 Creatinine 1.52, BUN 29, Potassium 3.9, Sodium 143, EGFR 46-54 04/12/2017 Creatinine 1.72, BUN 37, Potassium 3.6, Sodium 142, EGFR 40-46  03/29/2017 Creatinine1.6, BUN 28.6, Potassium4.0, Sodium141, EGFR51 03/15/2017 Creatinine1.7, BUN 33.6, Potassium3.7, Sodium142, DJTT01  03/01/2017 Creatinine1.5, BUN 22.8, Potassium3.7, Sodium143, EGFR55  11/15/2018Creatinine 1.6, BUN 20.4, Potassium3.9, Sodium142, EGFR54  11/01/2018Creatinine 1.4, BUN 28.3, Potassium4.0, Sodium141, EGFR>60  10/18/2018Creatinine 1.4, BUN 25.6, Potassium3.8, Sodium142, EGFR>60  10/15/2018Creatinine 1.56, BUN28, Potassium 4.1, Sodium144  10/09/2018Creatinine 1.58, BUN19, Potassium 3.6, Sodium140  10/08/2018Creatinine 1.54, BUN26, Potassium 3.6, Sodium139  10/07/2018Creatinine 1.62, BUN16,Potassium3.5, Sodium136  01/04/2017 Creatinine 1.9, BUN 23.5, Potassium 3.6, Sodium 140, EGFR 41 A complete set of results can be found in results review.  Recommendations: No changes.  Encouraged to call for fluid symptoms.  Follow-up plan: ICM clinic phone appointment on 07/09/2017.   Copy of ICM check sent to Dr. Caryl Comes.   3 month ICM trend: 06/08/2017    1 Year ICM trend:       [name], RN 06/08/2017 12:28 PM

## 2017-06-09 ENCOUNTER — Other Ambulatory Visit: Payer: Self-pay | Admitting: Internal Medicine

## 2017-06-14 LAB — CUP PACEART REMOTE DEVICE CHECK
Battery Remaining Percentage: 56 %
Battery Voltage: 2.98 V
Brady Statistic RV Percent Paced: 1 %
Date Time Interrogation Session: 20190226070017
HIGH POWER IMPEDANCE MEASURED VALUE: 37 Ohm
HIGH POWER IMPEDANCE MEASURED VALUE: 37 Ohm
Implantable Lead Implant Date: 20140703
Lead Channel Impedance Value: 410 Ohm
Lead Channel Pacing Threshold Amplitude: 0.5 V
Lead Channel Pacing Threshold Pulse Width: 0.5 ms
Lead Channel Sensing Intrinsic Amplitude: 11.7 mV
Lead Channel Setting Pacing Amplitude: 2.5 V
MDC IDC LEAD LOCATION: 753860
MDC IDC MSMT BATTERY REMAINING LONGEVITY: 60 mo
MDC IDC PG IMPLANT DT: 20140703
MDC IDC PG SERIAL: 1105031
MDC IDC SET LEADCHNL RV PACING PULSEWIDTH: 0.5 ms
MDC IDC SET LEADCHNL RV SENSING SENSITIVITY: 0.5 mV

## 2017-06-15 ENCOUNTER — Telehealth: Payer: Self-pay | Admitting: *Deleted

## 2017-06-15 NOTE — Telephone Encounter (Signed)
Oncology Nurse Navigator Documentation  Oncology Nurse Navigator Flowsheets 06/15/2017  Navigator Location [facility]  Navigator Encounter Type Telephone/I followed up on Jason Russell's schedule. He needs CT chest before he sees Dr. Julien Nordmann on 3/21.  Scan is authorized. I called Jason Russell and gave him the number to central scheduling to call and schedule. He was thankful for the call.   Telephone Outgoing Call  Treatment Phase Treatment  Barriers/Navigation Needs Coordination of Care;Education  Education Other  Interventions Education;Coordination of Care  Coordination of Care Other  Education Method Verbal  Acuity Level 1  Time Spent with Patient 30

## 2017-06-19 ENCOUNTER — Ambulatory Visit (HOSPITAL_COMMUNITY)
Admission: RE | Admit: 2017-06-19 | Discharge: 2017-06-19 | Disposition: A | Discharge status: Home / Self Care | Payer: Medicare Other | Source: Ambulatory Visit | Attending: Internal Medicine | Admitting: Internal Medicine

## 2017-06-19 DIAGNOSIS — Z85118 Personal history of other malignant neoplasm of bronchus and lung: Secondary | ICD-10-CM | POA: Diagnosis not present

## 2017-06-19 DIAGNOSIS — J439 Emphysema, unspecified: Secondary | ICD-10-CM | POA: Insufficient documentation

## 2017-06-19 DIAGNOSIS — C3490 Malignant neoplasm of unspecified part of unspecified bronchus or lung: Secondary | ICD-10-CM | POA: Diagnosis not present

## 2017-06-19 DIAGNOSIS — I700 Atherosclerosis of aorta: Secondary | ICD-10-CM | POA: Diagnosis not present

## 2017-06-19 DIAGNOSIS — Z923 Personal history of irradiation: Secondary | ICD-10-CM | POA: Diagnosis not present

## 2017-06-19 DIAGNOSIS — Z9889 Other specified postprocedural states: Secondary | ICD-10-CM | POA: Diagnosis not present

## 2017-06-19 MED ORDER — IOPAMIDOL (ISOVUE-300) INJECTION 61%
INTRAVENOUS | Status: AC
  Filled 2017-06-19: qty 75

## 2017-06-19 MED ORDER — IOPAMIDOL (ISOVUE-300) INJECTION 61%
75.0000 mL | Freq: Once | INTRAVENOUS | Status: AC | PRN
  Administered 2017-06-19: 75 mL via INTRAVENOUS

## 2017-06-21 ENCOUNTER — Encounter: Payer: Self-pay | Admitting: Internal Medicine

## 2017-06-21 ENCOUNTER — Inpatient Hospital Stay: Payer: Medicare Other

## 2017-06-21 ENCOUNTER — Telehealth: Payer: Self-pay | Admitting: Internal Medicine

## 2017-06-21 ENCOUNTER — Inpatient Hospital Stay (HOSPITAL_BASED_OUTPATIENT_CLINIC_OR_DEPARTMENT_OTHER): Payer: Medicare Other | Admitting: Internal Medicine

## 2017-06-21 VITALS — BP 141/70 | HR 78 | Temp 99.0°F | Resp 20 | Ht 73.0 in | Wt 297.7 lb

## 2017-06-21 DIAGNOSIS — Z923 Personal history of irradiation: Secondary | ICD-10-CM | POA: Diagnosis not present

## 2017-06-21 DIAGNOSIS — C3411 Malignant neoplasm of upper lobe, right bronchus or lung: Secondary | ICD-10-CM | POA: Diagnosis not present

## 2017-06-21 DIAGNOSIS — R5382 Chronic fatigue, unspecified: Secondary | ICD-10-CM | POA: Diagnosis not present

## 2017-06-21 DIAGNOSIS — Z9221 Personal history of antineoplastic chemotherapy: Secondary | ICD-10-CM | POA: Diagnosis not present

## 2017-06-21 DIAGNOSIS — I10 Essential (primary) hypertension: Secondary | ICD-10-CM | POA: Diagnosis not present

## 2017-06-21 DIAGNOSIS — Z5112 Encounter for antineoplastic immunotherapy: Secondary | ICD-10-CM

## 2017-06-21 DIAGNOSIS — R21 Rash and other nonspecific skin eruption: Secondary | ICD-10-CM | POA: Diagnosis not present

## 2017-06-21 LAB — CBC WITH DIFFERENTIAL/PLATELET
Basophils Absolute: 0 10*3/uL (ref 0.0–0.1)
Basophils Relative: 0 %
EOS ABS: 0.1 10*3/uL (ref 0.0–0.5)
EOS PCT: 2 %
HCT: 37.3 % — ABNORMAL LOW (ref 38.4–49.9)
Hemoglobin: 11.7 g/dL — ABNORMAL LOW (ref 13.0–17.1)
LYMPHS ABS: 0.5 10*3/uL — AB (ref 0.9–3.3)
Lymphocytes Relative: 9 %
MCH: 28.9 pg (ref 27.2–33.4)
MCHC: 31.4 g/dL — AB (ref 32.0–36.0)
MCV: 92.1 fL (ref 79.3–98.0)
Monocytes Absolute: 0.9 10*3/uL (ref 0.1–0.9)
Monocytes Relative: 16 %
Neutro Abs: 4 10*3/uL (ref 1.5–6.5)
Neutrophils Relative %: 73 %
Platelets: 140 10*3/uL (ref 140–400)
RBC: 4.05 MIL/uL — ABNORMAL LOW (ref 4.20–5.82)
RDW: 15.4 % — ABNORMAL HIGH (ref 11.0–14.6)
WBC: 5.5 10*3/uL (ref 4.0–10.3)

## 2017-06-21 LAB — COMPREHENSIVE METABOLIC PANEL
ALT: 12 U/L (ref 0–55)
AST: 17 U/L (ref 5–34)
Albumin: 3.5 g/dL (ref 3.5–5.0)
Alkaline Phosphatase: 99 U/L (ref 40–150)
Anion gap: 8 (ref 3–11)
BUN: 27 mg/dL — AB (ref 7–26)
CHLORIDE: 109 mmol/L (ref 98–109)
CO2: 24 mmol/L (ref 22–29)
CREATININE: 1.6 mg/dL — AB (ref 0.70–1.30)
Calcium: 9.3 mg/dL (ref 8.4–10.4)
GFR calc Af Amer: 51 mL/min — ABNORMAL LOW (ref >60)
GFR, EST NON AFRICAN AMERICAN: 44 mL/min — AB (ref >60)
Glucose, Bld: 114 mg/dL (ref 70–140)
Potassium: 4 mmol/L (ref 3.5–5.1)
Sodium: 141 mmol/L (ref 136–145)
Total Bilirubin: 0.4 mg/dL (ref 0.2–1.2)
Total Protein: 6.7 g/dL (ref 6.4–8.3)

## 2017-06-21 LAB — TSH: TSH: 0.307 u[IU]/mL — AB (ref 0.320–4.118)

## 2017-06-21 MED ORDER — DURVALUMAB 500 MG/10ML IV SOLN
9.5000 mg/kg | Freq: Once | INTRAVENOUS | Status: AC
  Administered 2017-06-21: 1240 mg via INTRAVENOUS
  Filled 2017-06-21: qty 20

## 2017-06-21 MED ORDER — SODIUM CHLORIDE 0.9 % IV SOLN
Freq: Once | INTRAVENOUS | Status: AC
  Administered 2017-06-21: 10:00:00 via INTRAVENOUS

## 2017-06-21 MED ORDER — SODIUM CHLORIDE 0.9% FLUSH
10.0000 mL | INTRAVENOUS | Status: DC | PRN
  Administered 2017-06-21: 10 mL
  Filled 2017-06-21: qty 10

## 2017-06-21 MED ORDER — HEPARIN SOD (PORK) LOCK FLUSH 100 UNIT/ML IV SOLN
500.0000 [IU] | Freq: Once | INTRAVENOUS | Status: AC | PRN
  Administered 2017-06-21: 500 [IU]
  Filled 2017-06-21: qty 5

## 2017-06-21 NOTE — Patient Instructions (Signed)
[facility] Discharge Instructions for Patients Receiving Chemotherapy  Today you received the following chemotherapy agents: Imfinzi.  To help prevent nausea and vomiting after your treatment, we encourage you to take your nausea medication as directed.   If you develop nausea and vomiting that is not controlled by your nausea medication, call the clinic.   BELOW ARE SYMPTOMS THAT SHOULD BE REPORTED IMMEDIATELY:  *FEVER GREATER THAN 100.5 F  *CHILLS WITH OR WITHOUT FEVER  NAUSEA AND VOMITING THAT IS NOT CONTROLLED WITH YOUR NAUSEA MEDICATION  *UNUSUAL SHORTNESS OF BREATH  *UNUSUAL BRUISING OR BLEEDING  TENDERNESS IN MOUTH AND THROAT WITH OR WITHOUT PRESENCE OF ULCERS  *URINARY PROBLEMS  *BOWEL PROBLEMS  UNUSUAL RASH Items with * indicate a potential emergency and should be followed up as soon as possible.  Feel free to call the clinic should you have any questions or concerns. The clinic phone number is [phone].  Please show the CHEMO ALERT CARD at check-in to the Emergency Department and triage nurse.   

## 2017-06-21 NOTE — Telephone Encounter (Signed)
3 cycles already scheduled per 3/21 los.

## 2017-06-21 NOTE — Progress Notes (Signed)
[place] [phone]   Fax:([phone]) [phone]  OFFICE PROGRESS NOTE  Lucianne Lei, MD 67 [address] [place] [address]  DIAGNOSIS: Stage IIIA (T1a, N2, M0) non-small cell lung cancer, adenocarcinoma presented with right upper lobe lung nodule in addition to mediastinal lymphadenopathy diagnosed in March 2018.  Biomarker Findings Tumor Mutational Burden - TMB-Intermediate (8 Muts/Mb) Microsatellite Status - MS-Stable Genomic Findings For a complete list of the genes assayed, please refer to the Appendix. ERBB2 amplification - equivocal? DNMT3A K493f*192 FUBP1 Q40* KEAP1 [id number] TP53 C275F 7 Disease relevant genes with no reportable alterations: EGFR, KRAS, ALK, BRAF, MET, RET, ROS1   PRIOR THERAPY: course of concurrent chemoradiation with weekly carboplatin for AUC of 2 and paclitaxel 45 MG/M2. Status post 6 cycles. Last cycle was given 10/16/2016.  CURRENT THERAPY:  Consolidation treatment with immunotherapy with Imfinzi (Durvalumab) 10 MG/M2 every 2 weeks. First dose 12/21/2016.  Status post 12 cycles.  INTERVAL HISTORY: Jason Mato67.o. male67 returns to the clinic today for follow-up visit.  The patient is feeling fine with no specific complaints.  He continues to tolerate his treatment with immunotherapy fairly well.  He denied having any recent weight loss or night sweats.  He has no nausea, vomiting, diarrhea or constipation.  He denied having any fever or chills.  He denied having any significant chest pain, shortness of breath, cough or hemoptysis.  The patient had repeat CT scan of the chest performed recently and he is here for evaluation and discussion of his discuss results.  MEDICAL HISTORY: Past Medical History:  Diagnosis Date  . Adenocarcinoma of right lung, stage 3 ([facility]5/23/2018  . Adenocarcinoma of right lung, stage 3 ([facility] [place]5/23/2018  . Anemia   . Arthritis    "hx right hip"  . Asthma    "when I was a child"    . Atrial fibrillation (HCC)    Amiodarone started 10/2011; Coumadin  . 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 St. Jude ICD implantation 10/03/12.  . Chronic anticoagulation   . Chronic fatigue 10/18/2016  . Chronic fatigue 10/18/2016  . Chronic systolic heart failure ([facility]   a. Echo 7/13: EF 25%;  b. echo 04/2012:  Mild LVH, EF 30-35%, Gr 1 DD, mild AI, mild MR, mild LAE  . COPD (chronic obstructive pulmonary disease) ([facility]  . Dyslipidemia   . Dysrhythmia   . Encounter for antineoplastic chemotherapy 08/24/2016  . Gout   . History of blood transfusion 10/15/2013   "don't know where the blood's going; HgB down to 5"  . Hyperlipidemia   . Hypertension   . Hypothyroidism   . NICM (nonischemic cardiomyopathy) ([facility]   [facility]/14:  minimal CAD  . Obesity   . OSA on CPAP   . Tobacco abuse     ALLERGIES:  has No Known Allergies.  MEDICATIONS:  Current Outpatient Medications  Medication Sig Dispense Refill  . Acetaminophen (TYLENOL) 325 MG CAPS Take 325 mg by mouth daily as needed.    . Marland Kitchencetaminophen (TYLENOL) 500 MG tablet Take 1,000 mg by mouth every 6 (six) hours as needed for mild pain.    . Marland Kitchenlbuterol (PROVENTIL HFA;VENTOLIN HFA) 108 (90 Base) MCG/ACT inhaler Inhale 2 puffs into the lungs every 4 (four) hours as needed for wheezing or shortness of breath. 1 Inhaler 2  . amiodarone (PACERONE) 100 MG tablet Take 1 tablet (100 mg total) by mouth daily. Please call and  schedule an appt with Dr. Harrington Challenger for further refill. 3rd attrmpt 15 tablet 0  . atorvastatin (LIPITOR) 20 MG tablet TAKE 1 TABLET BY MOUTH EVERY DAY 90 tablet 2  . carvedilol (COREG) 25 MG tablet TAKE 1 TABLET BY MOUTH TWICE A DAY WITH A MEAL 60 tablet 11  . [employer] D3 2000 units CAPS Take 2,000 Units by mouth daily.   11  . feeding supplement, ENSURE ENLIVE, (ENSURE ENLIVE) LIQD Take 237 mLs by mouth 2 (two) times daily between meals. 237 mL 12  . ferrous gluconate (FERGON) 324 MG tablet Take 1  tablet (324 mg total) by mouth 3 (three) times daily with meals. 90 tablet 8  . Fluticasone-Umeclidin-Vilant (TRELEGY ELLIPTA) 100-62.5-25 MCG/INH AEPB Inhale 1 puff into the lungs daily. 1 each 6  . furosemide (LASIX) 20 MG tablet TAKE 3 TABLETS (60 MG) EVERY TUE AND SAT, ALL OTHER DAYS TAKE 2 TABLETS (40 MG). 64 tablet 7  . hydrALAZINE (APRESOLINE) 100 MG tablet Take 100 mg by mouth 3 (three) times daily.   1  . levothyroxine (SYNTHROID, LEVOTHROID) 50 MCG tablet TAKE 1 TABLET (50 MCG TOTAL) BY MOUTH DAILY BEFORE BREAKFAST. 90 tablet 3  . lidocaine-prilocaine (EMLA) cream Apply generous amount to port site at least 1 hr prior to treatment.  DO NOT RUB IN. 30 g 1  . lisinopril (PRINIVIL,ZESTRIL) 40 MG tablet TAKE 1 TABLET BY MOUTH DAILY. 30 tablet 0  . magnesium oxide (MAG-OX) 400 (241.3 Mg) MG tablet Take 1 tablet (400 mg total) by mouth daily. 30 tablet 0  . potassium chloride SA (KLOR-CON M20) 20 MEQ tablet TAKE 1 TABLET BY MOUTH EVERY DAY *ON DAYS WHEN LASIX IS TAKEN, TAKE 2 TABLETS*  Dr. Harrington Challenger for further refills. 3rd attempt 30 tablet 0  . rivaroxaban (XARELTO) 20 MG TABS tablet Take 1 tablet (20 mg total) by mouth daily. 90 tablet 2  . spironolactone (ALDACTONE) 25 MG tablet TAKE 0.5 TABLETS (12.5 MG TOTAL) BY MOUTH DAILY. 45 tablet 0  . ULORIC 40 MG tablet Take 1 tablet (40 mg total) by mouth daily. 30 tablet 0  . prochlorperazine (COMPAZINE) 10 MG tablet Take 1 tablet (10 mg total) by mouth every 6 (six) hours as needed for nausea or vomiting. (Patient not taking: Reported on 06/07/2017) 30 tablet 0  . sildenafil (VIAGRA) 100 MG tablet Take 100 mg by mouth.    . sucralfate (CARAFATE) 1 g tablet TAKE 1 TABLET BY MOUTH FOUR TIMES DAILY (5 MINUTES BEFORE MEALS AND AT BEDTIME) FOR ESOPHAGITIS (Patient not taking: Reported on 05/28/2017) 120 tablet 2   No current facility-administered medications for this visit.     SURGICAL HISTORY:  Past Surgical History:  Procedure Laterality Date  .  CARDIAC DEFIBRILLATOR PLACEMENT  2014  . CARDIOVERSION  2011  . COLONOSCOPY WITH PROPOFOL Left 10/17/2013   Procedure: COLONOSCOPY WITH PROPOFOL;  Surgeon: Inda Castle, MD;  Location: [facility];  Service: Endoscopy;  Laterality: Left;  . ESOPHAGOGASTRODUODENOSCOPY N/A 10/17/2013   Procedure: ESOPHAGOGASTRODUODENOSCOPY (EGD);  Surgeon: Inda Castle, MD;  Location: [facility];  Service: Endoscopy;  Laterality: N/A;  . GIVENS CAPSULE STUDY N/A 10/29/2013   Procedure: GIVENS CAPSULE STUDY;  Surgeon: Inda Castle, MD;  Location: [facility];  Service: Endoscopy;  Laterality: N/A;  . IMPLANTABLE CARDIOVERTER DEFIBRILLATOR IMPLANT Left 10/03/2012   Procedure: IMPLANTABLE CARDIOVERTER DEFIBRILLATOR IMPLANT;  Surgeon: Deboraha Sprang, MD;  [facility];  Service: Cardiovascular;  Laterality: Left;  . IR FLUORO GUIDE PORT  INSERTION RIGHT  09/21/2016  . IR US GUIDE VASC ACCESS RIGHT  09/21/2016  . JOINT REPLACEMENT    . TONSILLECTOMY  1950's  . TOTAL HIP ARTHROPLASTY Right 11/25/1997  . VIDEO BRONCHOSCOPY WITH ENDOBRONCHIAL ULTRASOUND N/A 08/09/2016   Procedure: VIDEO BRONCHOSCOPY WITH ENDOBRONCHIAL ULTRASOUND;  Surgeon: Javier Glazier, MD;  Location: [facility] OR;  Service: Thoracic;  Laterality: N/A;    REVIEW OF SYSTEMS:  Constitutional: negative Eyes: negative Ears, nose, mouth, throat, and face: negative Respiratory: negative Cardiovascular: negative Gastrointestinal: negative Genitourinary:negative Integument/breast: negative Hematologic/lymphatic: negative Musculoskeletal:negative Neurological: negative Behavioral/Psych: negative Endocrine: negative Allergic/Immunologic: negative   PHYSICAL EXAMINATION: General appearance: alert, cooperative and no distress Head: Normocephalic, without obvious abnormality, atraumatic Neck: no adenopathy, no JVD, supple, symmetrical, trachea midline and thyroid not enlarged, symmetric, no tenderness/mass/nodules Lymph nodes: Cervical,  supraclavicular, and axillary nodes normal. Resp: clear to auscultation bilaterally Back: symmetric, no curvature. ROM normal. No CVA tenderness. Cardio: regular rate and rhythm, S1, S2 normal, no murmur, click, rub or gallop GI: soft, non-tender; bowel sounds normal; no masses,  no organomegaly Extremities: extremities normal, atraumatic, no cyanosis or edema Neurologic: Alert and oriented X 3, normal strength and tone. Normal symmetric reflexes. Normal coordination and gait  ECOG PERFORMANCE STATUS: 1 - Symptomatic but completely ambulatory  Blood pressure (!) 141/70, pulse 78, temperature 99 F (37.2 C), temperature source Oral, resp. rate 20, height '6\' 1"'  (1.854 m), weight 297 lb 11.2 oz (135 kg), SpO2 99 %.  LABORATORY DATA: Lab Results  Component Value Date   WBC 5.5 06/21/2017   HGB 11.7 (L) 06/21/2017   HCT 37.3 (L) 06/21/2017   MCV 92.1 06/21/2017   PLT 140 06/21/2017      Chemistry      Component Value Date/Time   NA 143 06/07/2017 0953   NA 141 03/29/2017 1038   K 4.0 06/07/2017 0953   K 4.0 03/29/2017 1038   CL 108 06/07/2017 0953   CO2 28 06/07/2017 0953   CO2 27 03/29/2017 1038   BUN 29 (H) 06/07/2017 0953   BUN 28.6 (H) 03/29/2017 1038   CREATININE 1.33 (H) 06/07/2017 0953   CREATININE 1.6 (H) 03/29/2017 1038      Component Value Date/Time   CALCIUM 9.5 06/07/2017 0953   CALCIUM 9.3 03/29/2017 1038   ALKPHOS 95 06/07/2017 0953   ALKPHOS 105 03/29/2017 1038   AST 15 06/07/2017 0953   AST 18 03/29/2017 1038   ALT 14 06/07/2017 0953   ALT 13 03/29/2017 1038   BILITOT 0.5 06/07/2017 0953   BILITOT 0.62 03/29/2017 1038       RADIOGRAPHIC STUDIES: Ct Chest W Contrast  Result Date: 06/20/2017 CLINICAL DATA:  Lung cancer, diagnosed March 2018, status post chemo radiation, on immunotherapy EXAM: CT CHEST WITH CONTRAST TECHNIQUE: Multidetector CT imaging of the chest was performed during intravenous contrast administration. CONTRAST:  38m ISOVUE-300  IOPAMIDOL (ISOVUE-300) INJECTION 61% COMPARISON:  03/23/2017 FINDINGS: Cardiovascular: Heart is normal in size.  No pericardial effusion. No evidence of thoracic aortic aneurysm. Mild atherosclerotic calcifications of the aortic arch. Left subclavian ICD. Mediastinum/Nodes: No suspicious mediastinal lymphadenopathy. Visualized thyroid is mildly enlarged. Lungs/Pleura: Radiation changes in the medial right hemithorax/paramediastinal region. Underlying nodular opacity is no longer evident. No suspicious pulmonary nodules. No focal consolidation. Mild centrilobular and paraseptal emphysematous changes, upper lobe predominant. No pleural effusion or pneumothorax. Upper Abdomen: Visualized upper abdomen is notable for a 5 mm right upper pole renal calculus. Musculoskeletal: Degenerative changes of the visualized thoracolumbar spine. IMPRESSION: Radiation  changes in the right hemithorax. No evidence of recurrent or metastatic disease. Aortic Atherosclerosis (ICD10-I70.0) and Emphysema (ICD10-J43.9). Electronically Signed   By: Julian Hy M.D.   On: 06/20/2017 10:00    ASSESSMENT AND PLAN:  This is a very pleasant 67 years old African-American male with a stage IIIa non-small cell lung cancer, adenocarcinoma.  The patient completed 6 weeks of concurrent chemoradiation with weekly carboplatin and paclitaxel and tolerated his treatment well except for odynophagia. The patient is currently on consolidation treatment with immunotherapy with Imfinzi (Durvalumab) status post 12 cycles.  The patient continues to tolerate his treatment with Imfinzi (Durvalumab) fairly well with no concerning complaints. Repeat CT scan of the chest showed no concerning findings for disease progression. I discussed the scan results with the patient and recommended for him to continue his current treatment with consolidation immunotherapy with Imfinzi (Durvalumab) every 2 weeks.  He will proceed with cycle #13 today. The patient will  come back for follow-up visit in 2 weeks for evaluation before starting cycle #14. For hypertension, he was advised to take his blood pressure medication as prescribed and to monitor it closely at home. He was also advised to call immediately if he has any concerning symptoms in the interval. The patient voices understanding of current disease status and treatment options and is in agreement with the current care plan. All questions were answered. The patient knows to call the clinic with any problems, questions or concerns. We can certainly see the patient much sooner if necessary.  Disclaimer: This note was dictated with voice recognition software. Similar sounding words can inadvertently be transcribed and may not be corrected upon review.

## 2017-06-21 NOTE — Progress Notes (Signed)
OK to treat with labs today per MD [facility]

## 2017-07-05 ENCOUNTER — Telehealth: Payer: Self-pay | Admitting: Internal Medicine

## 2017-07-05 ENCOUNTER — Inpatient Hospital Stay: Payer: Medicare Other | Attending: Internal Medicine

## 2017-07-05 ENCOUNTER — Inpatient Hospital Stay (HOSPITAL_BASED_OUTPATIENT_CLINIC_OR_DEPARTMENT_OTHER): Payer: Medicare Other | Admitting: Internal Medicine

## 2017-07-05 ENCOUNTER — Encounter: Payer: Self-pay | Admitting: Internal Medicine

## 2017-07-05 ENCOUNTER — Inpatient Hospital Stay: Payer: Medicare Other

## 2017-07-05 VITALS — BP 131/66 | HR 78 | Temp 97.7°F | Resp 18 | Ht 73.0 in | Wt 297.6 lb

## 2017-07-05 DIAGNOSIS — E785 Hyperlipidemia, unspecified: Secondary | ICD-10-CM | POA: Diagnosis not present

## 2017-07-05 DIAGNOSIS — E039 Hypothyroidism, unspecified: Secondary | ICD-10-CM | POA: Insufficient documentation

## 2017-07-05 DIAGNOSIS — I700 Atherosclerosis of aorta: Secondary | ICD-10-CM | POA: Insufficient documentation

## 2017-07-05 DIAGNOSIS — I110 Hypertensive heart disease with heart failure: Secondary | ICD-10-CM | POA: Insufficient documentation

## 2017-07-05 DIAGNOSIS — R5382 Chronic fatigue, unspecified: Secondary | ICD-10-CM | POA: Insufficient documentation

## 2017-07-05 DIAGNOSIS — Z7901 Long term (current) use of anticoagulants: Secondary | ICD-10-CM | POA: Diagnosis not present

## 2017-07-05 DIAGNOSIS — G4733 Obstructive sleep apnea (adult) (pediatric): Secondary | ICD-10-CM

## 2017-07-05 DIAGNOSIS — Z923 Personal history of irradiation: Secondary | ICD-10-CM | POA: Diagnosis not present

## 2017-07-05 DIAGNOSIS — Z9989 Dependence on other enabling machines and devices: Secondary | ICD-10-CM | POA: Diagnosis not present

## 2017-07-05 DIAGNOSIS — J449 Chronic obstructive pulmonary disease, unspecified: Secondary | ICD-10-CM | POA: Insufficient documentation

## 2017-07-05 DIAGNOSIS — Z5112 Encounter for antineoplastic immunotherapy: Secondary | ICD-10-CM | POA: Diagnosis not present

## 2017-07-05 DIAGNOSIS — Z9221 Personal history of antineoplastic chemotherapy: Secondary | ICD-10-CM | POA: Diagnosis not present

## 2017-07-05 DIAGNOSIS — I5022 Chronic systolic (congestive) heart failure: Secondary | ICD-10-CM | POA: Diagnosis not present

## 2017-07-05 DIAGNOSIS — C3411 Malignant neoplasm of upper lobe, right bronchus or lung: Secondary | ICD-10-CM

## 2017-07-05 DIAGNOSIS — I4891 Unspecified atrial fibrillation: Secondary | ICD-10-CM | POA: Diagnosis not present

## 2017-07-05 DIAGNOSIS — Z79899 Other long term (current) drug therapy: Secondary | ICD-10-CM | POA: Insufficient documentation

## 2017-07-05 LAB — COMPREHENSIVE METABOLIC PANEL
ALT: 15 U/L (ref 0–55)
ANION GAP: 7 (ref 3–11)
AST: 19 U/L (ref 5–34)
Albumin: 3.4 g/dL — ABNORMAL LOW (ref 3.5–5.0)
Alkaline Phosphatase: 101 U/L (ref 40–150)
BUN: 25 mg/dL (ref 7–26)
CHLORIDE: 109 mmol/L (ref 98–109)
CO2: 27 mmol/L (ref 22–29)
Calcium: 9.1 mg/dL (ref 8.4–10.4)
Creatinine, Ser: 1.65 mg/dL — ABNORMAL HIGH (ref 0.70–1.30)
GFR calc non Af Amer: 42 mL/min — ABNORMAL LOW (ref >60)
GFR, EST AFRICAN AMERICAN: 49 mL/min — AB (ref >60)
Glucose, Bld: 99 mg/dL (ref 70–140)
POTASSIUM: 3.7 mmol/L (ref 3.5–5.1)
Sodium: 143 mmol/L (ref 136–145)
Total Bilirubin: 0.4 mg/dL (ref 0.2–1.2)
Total Protein: 6.4 g/dL (ref 6.4–8.3)

## 2017-07-05 LAB — CBC WITH DIFFERENTIAL/PLATELET
BASOS ABS: 0 10*3/uL (ref 0.0–0.1)
Basophils Relative: 1 %
EOS ABS: 0.1 10*3/uL (ref 0.0–0.5)
Eosinophils Relative: 2 %
HCT: 35.5 % — ABNORMAL LOW (ref 38.4–49.9)
HEMOGLOBIN: 11.7 g/dL — AB (ref 13.0–17.1)
LYMPHS ABS: 0.4 10*3/uL — AB (ref 0.9–3.3)
LYMPHS PCT: 10 %
MCH: 29.7 pg (ref 27.2–33.4)
MCHC: 32.8 g/dL (ref 32.0–36.0)
MCV: 90.5 fL (ref 79.3–98.0)
Monocytes Absolute: 0.7 10*3/uL (ref 0.1–0.9)
Monocytes Relative: 16 %
NEUTROS PCT: 71 %
Neutro Abs: 3.1 10*3/uL (ref 1.5–6.5)
Platelets: 134 10*3/uL — ABNORMAL LOW (ref 140–400)
RBC: 3.93 MIL/uL — AB (ref 4.20–5.82)
RDW: 15.7 % — ABNORMAL HIGH (ref 11.0–14.6)
WBC: 4.4 10*3/uL (ref 4.0–10.3)

## 2017-07-05 MED ORDER — SODIUM CHLORIDE 0.9% FLUSH
10.0000 mL | INTRAVENOUS | Status: DC | PRN
  Filled 2017-07-05: qty 10

## 2017-07-05 MED ORDER — SODIUM CHLORIDE 0.9 % IV SOLN: Freq: Once | INTRAVENOUS | Status: DC

## 2017-07-05 MED ORDER — SODIUM CHLORIDE 0.9 % IV SOLN
9.5000 mg/kg | Freq: Once | INTRAVENOUS | Status: AC
  Administered 2017-07-05: 1240 mg via INTRAVENOUS
  Filled 2017-07-05: qty 20

## 2017-07-05 MED ORDER — HEPARIN SOD (PORK) LOCK FLUSH 100 UNIT/ML IV SOLN
500.0000 [IU] | Freq: Once | INTRAVENOUS | Status: DC | PRN
  Filled 2017-07-05: qty 5

## 2017-07-05 NOTE — Telephone Encounter (Signed)
Scheduled appt per 4/4 los - patient to get an updated schedule in the treatment area. - also my chart active.

## 2017-07-05 NOTE — Progress Notes (Signed)
[place] [phone]   [phone]  OFFICE PROGRESS NOTE  Lucianne Lei, MD [address] [place] [address]  DIAGNOSIS: Stage IIIA (T1a, N2, M0) non-small cell lung cancer, adenocarcinoma presented with right upper lobe lung nodule in addition to mediastinal lymphadenopathy diagnosed in March 2018.  Biomarker Findings Tumor Mutational Burden - TMB-Intermediate (8 Muts/Mb) Microsatellite Status - MS-Stable Genomic Findings For a complete list of the genes assayed, please refer to the Appendix. ERBB2 amplification - equivocal? DNMT3A K477f*192 FUBP1 Q40* KEAP1 [id number] TP53 C275F 7 Disease relevant genes with no reportable alterations: EGFR, KRAS, ALK, BRAF, MET, RET, ROS1   PRIOR THERAPY: course of concurrent chemoradiation with weekly carboplatin for AUC of 2 and paclitaxel 45 MG/M2. Status post 6 cycles. Last cycle was given 10/16/2016.  CURRENT THERAPY:  Consolidation treatment with immunotherapy with Imfinzi (Durvalumab) 10 MG/M2 every 2 weeks. First dose 12/21/2016.  Status post 13 cycles.  INTERVAL HISTORY: Jason Winker89.o. male returns to the clinic today for follow-up visit accompanied by his daughter.  The patient is feeling fine today with no specific complaints.  He continues to tolerate his treatment with immunotherapy fairly well.  He denied having any chest pain, shortness of breath, cough or hemoptysis.  He denied having any fever or chills.  He has no nausea, vomiting, diarrhea or constipation.  He is here today for evaluation before starting cycle #14.  MEDICAL HISTORY: Past Medical History:  Diagnosis Date  . Adenocarcinoma of right lung, stage 3 ([facility]5/23/2018  . Adenocarcinoma of right lung, stage 3 (HCD[facility]5/23/2018  . Anemia   . Arthritis    "hx right hip"  . Asthma    "when I was a child"  . Atrial fibrillation (HCC)    Amiodarone started 10/2011; Coumadin  . Automatic implantable  cardioverter-defibrillator in situ 10/03/2012   a. St. Jude ICD implantation 10/03/12.  . Chronic anticoagulation   . Chronic fatigue 10/18/2016  . Chronic fatigue 10/18/2016  . Chronic systolic heart failure ([facility]CL[facility]   a. Echo 7/13: EF 25%;  b. echo 04/2012:  Mild LVH, EF 30-35%, Gr 1 DD, mild AI, mild MR, mild LAE  . COPD (chronic obstructive pulmonary disease) ([facility]CF[facility]ington [facility]  . Dyslipidemia   . Dysrhythmia   . Encounter for antineoplastic chemotherapy 08/24/2016  . Gout   . History of blood transfusion 10/15/2013   "don't know where the blood's going; HgB down to 5"  . Hyperlipidemia   . Hypertension   . Hypothyroidism   . NICM (nonischemic cardiomyopathy) ([facility]   [facility]/14:  minimal CAD  . Obesity   . OSA on CPAP   . Tobacco abuse     ALLERGIES:  has No Known Allergies.  MEDICATIONS:  Current Outpatient Medications  Medication Sig Dispense Refill  . Acetaminophen (TYLENOL) 325 MG CAPS Take 325 mg by mouth daily as needed.    . Marland Kitchencetaminophen (TYLENOL) 500 MG tablet Take 1,000 mg by mouth every 6 (six) hours as needed for mild pain.    . Marland Kitchenlbuterol (PROVENTIL HFA;VENTOLIN HFA) 108 (90 Base) MCG/ACT inhaler Inhale 2 puffs into the lungs every 4 (four) hours as needed for wheezing or shortness of breath. 1 Inhaler 2  . amiodarone (PACERONE) 100 MG tablet Take 1 tablet (100 mg total) by mouth daily. Please call and schedule an appt with Dr. RoHarrington Challengeror further refill. 3rd attrmpt 15 tablet 0  . atorvastatin (LIPITOR) 20 MG tablet  TAKE 1 TABLET BY MOUTH EVERY DAY 90 tablet 2  . carvedilol (COREG) 25 MG tablet TAKE 1 TABLET BY MOUTH TWICE A DAY WITH A MEAL 60 tablet 11  . [employer] D3 2000 units CAPS Take 2,000 Units by mouth daily.   11  . feeding supplement, ENSURE ENLIVE, (ENSURE ENLIVE) LIQD Take 237 mLs by mouth 2 (two) times daily between meals. 237 mL 12  . ferrous gluconate (FERGON) 324 MG tablet Take 1 tablet (324 mg total) by mouth 3 (three) times daily with meals. 90 tablet 8  .  Fluticasone-Umeclidin-Vilant (TRELEGY ELLIPTA) 100-62.5-25 MCG/INH AEPB Inhale 1 puff into the lungs daily. 1 each 6  . furosemide (LASIX) 20 MG tablet TAKE 3 TABLETS (60 MG) EVERY TUE AND SAT, ALL OTHER DAYS TAKE 2 TABLETS (40 MG). 64 tablet 7  . hydrALAZINE (APRESOLINE) 100 MG tablet Take 100 mg by mouth 3 (three) times daily.   1  . levothyroxine (SYNTHROID, LEVOTHROID) 50 MCG tablet TAKE 1 TABLET (50 MCG TOTAL) BY MOUTH DAILY BEFORE BREAKFAST. 90 tablet 3  . lidocaine-prilocaine (EMLA) cream Apply generous amount to port site at least 1 hr prior to treatment.  DO NOT RUB IN. 30 g 1  . lisinopril (PRINIVIL,ZESTRIL) 40 MG tablet TAKE 1 TABLET BY MOUTH DAILY. 30 tablet 0  . magnesium oxide (MAG-OX) 400 (241.3 Mg) MG tablet Take 1 tablet (400 mg total) by mouth daily. 30 tablet 0  . potassium chloride SA (KLOR-CON M20) 20 MEQ tablet TAKE 1 TABLET BY MOUTH EVERY DAY *ON DAYS WHEN LASIX IS TAKEN, TAKE 2 TABLETS*  Dr. Harrington Challenger for further refills. 3rd attempt 30 tablet 0  . prochlorperazine (COMPAZINE) 10 MG tablet Take 1 tablet (10 mg total) by mouth every 6 (six) hours as needed for nausea or vomiting. (Patient not taking: Reported on 06/07/2017) 30 tablet 0  . rivaroxaban (XARELTO) 20 MG TABS tablet Take 1 tablet (20 mg total) by mouth daily. 90 tablet 2  . sildenafil (VIAGRA) 100 MG tablet Take 100 mg by mouth.    . spironolactone (ALDACTONE) 25 MG tablet TAKE 0.5 TABLETS (12.5 MG TOTAL) BY MOUTH DAILY. 45 tablet 0  . sucralfate (CARAFATE) 1 g tablet TAKE 1 TABLET BY MOUTH FOUR TIMES DAILY (5 MINUTES BEFORE MEALS AND AT BEDTIME) FOR ESOPHAGITIS (Patient not taking: Reported on 05/28/2017) 120 tablet 2  . ULORIC 40 MG tablet Take 1 tablet (40 mg total) by mouth daily. 30 tablet 0   No current facility-administered medications for this visit.     SURGICAL HISTORY:  Past Surgical History:  Procedure Laterality Date  . CARDIAC DEFIBRILLATOR PLACEMENT  2014  . CARDIOVERSION  2011  . COLONOSCOPY WITH  PROPOFOL Left 10/17/2013   Procedure: COLONOSCOPY WITH PROPOFOL;  Surgeon: Inda Castle, MD;  Location: [facility];  Service: Endoscopy;  Laterality: Left;  . ESOPHAGOGASTRODUODENOSCOPY N/A 10/17/2013   Procedure: ESOPHAGOGASTRODUODENOSCOPY (EGD);  Surgeon: Inda Castle, MD;  Location: [facility];  Service: Endoscopy;  Laterality: N/A;  . GIVENS CAPSULE STUDY N/A 10/29/2013   Procedure: GIVENS CAPSULE STUDY;  Surgeon: Inda Castle, MD;  Location: [facility] ENDOSCOPY;  Service: Endoscopy;  Laterality: N/A;  . IMPLANTABLE CARDIOVERTER DEFIBRILLATOR IMPLANT Left 10/03/2012   Procedure: IMPLANTABLE CARDIOVERTER DEFIBRILLATOR IMPLANT;  Surgeon: Deboraha Sprang, MD;  Location[facility] C[facility] LAB;  Service: Cardiovascular;  Laterality: Left;  . IR FLUORO GUIDE PORT INSERTION RIGHT  09/21/2016  . IR US GUIDE VASC ACCESS RIGHT  09/21/2016  . JOINT REPLACEMENT    .  TONSILLECTOMY  1950's  . TOTAL HIP ARTHROPLASTY Right 11/25/1997  . VIDEO BRONCHOSCOPY WITH ENDOBRONCHIAL ULTRASOUND N/A 08/09/2016   Procedure: VIDEO BRONCHOSCOPY WITH ENDOBRONCHIAL ULTRASOUND;  Surgeon: Javier Glazier, MD;  [facility];  Service: Thoracic;  Laterality: N/A;    REVIEW OF SYSTEMS:  A comprehensive review of systems was negative.   PHYSICAL EXAMINATION: General appearance: alert, cooperative and no distress Head: Normocephalic, without obvious abnormality, atraumatic Neck: no adenopathy, no JVD, supple, symmetrical, trachea midline and thyroid not enlarged, symmetric, no tenderness/mass/nodules Lymph nodes: Cervical, supraclavicular, and axillary nodes normal. Resp: clear to auscultation bilaterally Back: symmetric, no curvature. ROM normal. No CVA tenderness. Cardio: regular rate and rhythm, S1, S2 normal, no murmur, click, rub or gallop GI: soft, non-tender; bowel sounds normal; no masses,  no organomegaly Extremities: extremities normal, atraumatic, no cyanosis or edema  ECOG PERFORMANCE STATUS: 1 - Symptomatic but  completely ambulatory  Blood pressure 131/66, pulse 78, temperature 97.7 F (36.5 C), temperature source Oral, resp. rate 18, height '6\' 1"'  (1.854 m), weight 297 lb 9.6 oz (135 kg), SpO2 99 %.  LABORATORY DATA: Lab Results  Component Value Date   WBC 4.4 07/05/2017   HGB 11.7 (L) 07/05/2017   HCT 35.5 (L) 07/05/2017   MCV 90.5 07/05/2017   PLT 134 (L) 07/05/2017      Chemistry      Component Value Date/Time   NA 141 06/21/2017 0816   NA 141 03/29/2017 1038   K 4.0 06/21/2017 0816   K 4.0 03/29/2017 1038   CL 109 06/21/2017 0816   CO2 24 06/21/2017 0816   CO2 27 03/29/2017 1038   BUN 27 (H) 06/21/2017 0816   BUN 28.6 (H) 03/29/2017 1038   CREATININE 1.60 (H) 06/21/2017 0816   CREATININE 1.6 (H) 03/29/2017 1038      Component Value Date/Time   CALCIUM 9.3 06/21/2017 0816   CALCIUM 9.3 03/29/2017 1038   ALKPHOS 99 06/21/2017 0816   ALKPHOS 105 03/29/2017 1038   AST 17 06/21/2017 0816   AST 18 03/29/2017 1038   ALT 12 06/21/2017 0816   ALT 13 03/29/2017 1038   BILITOT 0.4 06/21/2017 0816   BILITOT 0.62 03/29/2017 1038       RADIOGRAPHIC STUDIES: Ct Chest W Contrast  Result Date: 06/20/2017 CLINICAL DATA:  Lung cancer, diagnosed March 2018, status post chemo radiation, on immunotherapy EXAM: CT CHEST WITH CONTRAST TECHNIQUE: Multidetector CT imaging of the chest was performed during intravenous contrast administration. CONTRAST:  59m ISOVUE-300 IOPAMIDOL (ISOVUE-300) INJECTION 61% COMPARISON:  03/23/2017 FINDINGS: Cardiovascular: Heart is normal in size.  No pericardial effusion. No evidence of thoracic aortic aneurysm. Mild atherosclerotic calcifications of the aortic arch. Left subclavian ICD. Mediastinum/Nodes: No suspicious mediastinal lymphadenopathy. Visualized thyroid is mildly enlarged. Lungs/Pleura: Radiation changes in the medial right hemithorax/paramediastinal region. Underlying nodular opacity is no longer evident. No suspicious pulmonary nodules. No focal  consolidation. Mild centrilobular and paraseptal emphysematous changes, upper lobe predominant. No pleural effusion or pneumothorax. Upper Abdomen: Visualized upper abdomen is notable for a 5 mm right upper pole renal calculus. Musculoskeletal: Degenerative changes of the visualized thoracolumbar spine. IMPRESSION: Radiation changes in the right hemithorax. No evidence of recurrent or metastatic disease. Aortic Atherosclerosis (ICD10-I70.0) and Emphysema (ICD10-J43.9). Electronically Signed   By: SJulian HyM.D.   On: 06/20/2017 10:00    ASSESSMENT AND PLAN:  This is a very pleasant 67years old African-American male with a stage IIIa non-small cell lung cancer, adenocarcinoma.  The patient completed 6  weeks of concurrent chemoradiation with weekly carboplatin and paclitaxel and tolerated his treatment well except for odynophagia. The patient is currently on consolidation treatment with immunotherapy with Imfinzi (Durvalumab) status post 13 cycles.  The patient continues to tolerate this treatment well with no concerning complaints. I recommended for him to proceed with cycle #14 today as a scheduled. I will see him back for follow-up visit in 2 weeks for evaluation before the next cycle of his treatment. For hypertension, he was advised to take his blood pressure medication as prescribed and to monitor it closely at home. The patient was advised to call immediately if he has any concerning symptoms in the interval. The patient voices understanding of current disease status and treatment options and is in agreement with the current care plan. All questions were answered. The patient knows to call the clinic with any problems, questions or concerns. We can certainly see the patient much sooner if necessary.  Disclaimer: This note was dictated with voice recognition software. Similar sounding words can inadvertently be transcribed and may not be corrected upon review.

## 2017-07-05 NOTE — Progress Notes (Signed)
CBC and CMET reviewed with MD, ok to treat despite labs.

## 2017-07-05 NOTE — Patient Instructions (Signed)
[facility] Discharge Instructions for Patients Receiving Chemotherapy  Today you received the following chemotherapy agents: Imfinzi.  To help prevent nausea and vomiting after your treatment, we encourage you to take your nausea medication as directed.   If you develop nausea and vomiting that is not controlled by your nausea medication, call the clinic.   BELOW ARE SYMPTOMS THAT SHOULD BE REPORTED IMMEDIATELY:  *FEVER GREATER THAN 100.5 F  *CHILLS WITH OR WITHOUT FEVER  NAUSEA AND VOMITING THAT IS NOT CONTROLLED WITH YOUR NAUSEA MEDICATION  *UNUSUAL SHORTNESS OF BREATH  *UNUSUAL BRUISING OR BLEEDING  TENDERNESS IN MOUTH AND THROAT WITH OR WITHOUT PRESENCE OF ULCERS  *URINARY PROBLEMS  *BOWEL PROBLEMS  UNUSUAL RASH Items with * indicate a potential emergency and should be followed up as soon as possible.  Feel free to call the clinic should you have any questions or concerns. The clinic phone number is [phone].  Please show the CHEMO ALERT CARD at check-in to the Emergency Department and triage nurse.   

## 2017-07-09 ENCOUNTER — Other Ambulatory Visit: Payer: Self-pay | Admitting: Internal Medicine

## 2017-07-09 ENCOUNTER — Telehealth: Payer: Self-pay | Admitting: Cardiology

## 2017-07-09 ENCOUNTER — Ambulatory Visit (INDEPENDENT_AMBULATORY_CARE_PROVIDER_SITE_OTHER): Payer: Medicare Other

## 2017-07-09 DIAGNOSIS — Z95810 Presence of automatic (implantable) cardiac defibrillator: Secondary | ICD-10-CM

## 2017-07-09 DIAGNOSIS — I5022 Chronic systolic (congestive) heart failure: Secondary | ICD-10-CM

## 2017-07-09 MED ORDER — AMIODARONE HCL 100 MG PO TABS: 100.0000 mg | ORAL_TABLET | Freq: Every day | ORAL | 0 refills | Status: DC

## 2017-07-09 NOTE — Telephone Encounter (Signed)
LMOVM reminding pt to send remote transmission.   

## 2017-07-10 ENCOUNTER — Telehealth: Payer: Self-pay

## 2017-07-10 NOTE — Telephone Encounter (Signed)
Remote ICM transmission received.  Attempted call to patient and left detailed message per DPR regarding transmission and next ICM scheduled for 08/09/2017.  Advised to return call for any fluid symptoms or questions.

## 2017-07-10 NOTE — Progress Notes (Signed)
EPIC Encounter for ICM Monitoring  Patient Name: Jason Russell is a 67 y.o. male Date: 07/10/2017 Primary Care Physican: Bland, Veita, MD Primary Cardiologist:Ross Electrophysiologist: Klein Dry Weight:Previous weight 286 lbs        Attempted call to patient and unable to reach.  Left detailed message regarding transmission.  Transmission reviewed.    Thoracic impedance normal.  Prescribed dosage: Furosemide 20 mg 3 tablets (60 mg total) every Tuesday and Saturday and 40 mg all the other days. Potassium 20 mEq 1 tablet every day and on days when lasix is taken, take 2 tablets  Labs: 07/05/2017 Creatinine 1.65, BUN 25, Potassium 3.7, Sodium 143, EGFR 42-49 06/21/2017 Creatinine 1.60, BUN 27, Potassium 4.0, Sodium 141, EGFR 44-51  06/07/2017 Creatinine 1.33, BUN 29, Potassium 4.0, Sodium 143, EGFR 55->60  05/24/2017 Creatinine 1.60, BUN 25, Potassium 4.2, Sodium 142, EGFR 44-51  05/10/2017 Creatinine 1.54, BUN 26, Potassium 3.4, Sodium 143, EGFR 46-53  04/26/2017 Creatinine1.52, BUN29, Potassium3.9, Sodium143, EGFR46-54 01/10/2019Creatinine 1.72, BUN37, Potassium3.6, Sodium142, EGFR40-46 03/29/2017 Creatinine1.6, BUN 28.6, Potassium4.0, Sodium141, EGFR51 03/15/2017 Creatinine1.7, BUN 33.6, Potassium3.7, Sodium142, EGFR49  03/01/2017 Creatinine1.5, BUN 22.8, Potassium3.7, Sodium143, EGFR55  11/15/2018Creatinine 1.6, BUN 20.4, Potassium3.9, Sodium142, EGFR54  11/01/2018Creatinine 1.4, BUN 28.3, Potassium4.0, Sodium141, EGFR>60  10/18/2018Creatinine 1.4, BUN 25.6, Potassium3.8, Sodium142, EGFR>60  10/15/2018Creatinine 1.56, BUN28, Potassium 4.1, Sodium144  10/09/2018Creatinine 1.58, BUN19, Potassium 3.6, Sodium140  10/08/2018Creatinine 1.54, BUN26, Potassium 3.6, Sodium139  10/07/2018Creatinine 1.62, BUN16,Potassium3.5, Sodium136  01/04/2017 Creatinine 1.9, BUN 23.5, Potassium 3.6, Sodium 140, EGFR 41 A  complete set of results can be found in results review.  Recommendations: Left voice mail with ICM number and encouraged to call if experiencing any fluid symptoms.  Follow-up plan: ICM clinic phone appointment on 08/09/2017.    Copy of ICM check sent to Dr. Klein.   3 month ICM trend: 07/10/2017    1 Year ICM trend:        S , RN 07/10/2017 9:33 AM   

## 2017-07-18 DIAGNOSIS — I272 Pulmonary hypertension, unspecified: Secondary | ICD-10-CM | POA: Diagnosis not present

## 2017-07-18 DIAGNOSIS — I10 Essential (primary) hypertension: Secondary | ICD-10-CM | POA: Diagnosis not present

## 2017-07-18 DIAGNOSIS — C3490 Malignant neoplasm of unspecified part of unspecified bronchus or lung: Secondary | ICD-10-CM | POA: Diagnosis not present

## 2017-07-18 DIAGNOSIS — I130 Hypertensive heart and chronic kidney disease with heart failure and stage 1 through stage 4 chronic kidney disease, or unspecified chronic kidney disease: Secondary | ICD-10-CM | POA: Diagnosis not present

## 2017-07-19 ENCOUNTER — Encounter: Payer: Self-pay | Admitting: Internal Medicine

## 2017-07-19 ENCOUNTER — Inpatient Hospital Stay (HOSPITAL_BASED_OUTPATIENT_CLINIC_OR_DEPARTMENT_OTHER): Payer: Medicare Other | Admitting: Internal Medicine

## 2017-07-19 ENCOUNTER — Telehealth: Payer: Self-pay | Admitting: Internal Medicine

## 2017-07-19 ENCOUNTER — Inpatient Hospital Stay: Payer: Medicare Other

## 2017-07-19 VITALS — BP 138/71 | HR 66 | Temp 98.1°F | Resp 18 | Ht 72.0 in | Wt 297.7 lb

## 2017-07-19 DIAGNOSIS — Z9221 Personal history of antineoplastic chemotherapy: Secondary | ICD-10-CM | POA: Diagnosis not present

## 2017-07-19 DIAGNOSIS — C3411 Malignant neoplasm of upper lobe, right bronchus or lung: Secondary | ICD-10-CM

## 2017-07-19 DIAGNOSIS — I4891 Unspecified atrial fibrillation: Secondary | ICD-10-CM | POA: Diagnosis not present

## 2017-07-19 DIAGNOSIS — R5382 Chronic fatigue, unspecified: Secondary | ICD-10-CM

## 2017-07-19 DIAGNOSIS — G4733 Obstructive sleep apnea (adult) (pediatric): Secondary | ICD-10-CM | POA: Diagnosis not present

## 2017-07-19 DIAGNOSIS — E039 Hypothyroidism, unspecified: Secondary | ICD-10-CM | POA: Diagnosis not present

## 2017-07-19 DIAGNOSIS — Z7901 Long term (current) use of anticoagulants: Secondary | ICD-10-CM

## 2017-07-19 DIAGNOSIS — I700 Atherosclerosis of aorta: Secondary | ICD-10-CM

## 2017-07-19 DIAGNOSIS — R7309 Other abnormal glucose: Secondary | ICD-10-CM | POA: Diagnosis not present

## 2017-07-19 DIAGNOSIS — Z79899 Other long term (current) drug therapy: Secondary | ICD-10-CM

## 2017-07-19 DIAGNOSIS — Z9989 Dependence on other enabling machines and devices: Secondary | ICD-10-CM

## 2017-07-19 DIAGNOSIS — Z923 Personal history of irradiation: Secondary | ICD-10-CM | POA: Diagnosis not present

## 2017-07-19 DIAGNOSIS — I5022 Chronic systolic (congestive) heart failure: Secondary | ICD-10-CM | POA: Diagnosis not present

## 2017-07-19 DIAGNOSIS — E785 Hyperlipidemia, unspecified: Secondary | ICD-10-CM

## 2017-07-19 DIAGNOSIS — J449 Chronic obstructive pulmonary disease, unspecified: Secondary | ICD-10-CM | POA: Diagnosis not present

## 2017-07-19 DIAGNOSIS — I110 Hypertensive heart disease with heart failure: Secondary | ICD-10-CM

## 2017-07-19 DIAGNOSIS — Z5112 Encounter for antineoplastic immunotherapy: Secondary | ICD-10-CM | POA: Diagnosis not present

## 2017-07-19 LAB — CBC WITH DIFFERENTIAL/PLATELET
BASOS ABS: 0 10*3/uL (ref 0.0–0.1)
Basophils Relative: 0 %
Eosinophils Absolute: 0.1 10*3/uL (ref 0.0–0.5)
Eosinophils Relative: 3 %
HCT: 38.1 % — ABNORMAL LOW (ref 38.4–49.9)
Hemoglobin: 12.1 g/dL — ABNORMAL LOW (ref 13.0–17.1)
LYMPHS PCT: 12 %
Lymphs Abs: 0.4 10*3/uL — ABNORMAL LOW (ref 0.9–3.3)
MCH: 29.4 pg (ref 27.2–33.4)
MCHC: 31.8 g/dL — ABNORMAL LOW (ref 32.0–36.0)
MCV: 92.7 fL (ref 79.3–98.0)
MONO ABS: 0.7 10*3/uL (ref 0.1–0.9)
Monocytes Relative: 19 %
Neutro Abs: 2.5 10*3/uL (ref 1.5–6.5)
Neutrophils Relative %: 66 %
Platelets: 135 10*3/uL — ABNORMAL LOW (ref 140–400)
RBC: 4.11 MIL/uL — ABNORMAL LOW (ref 4.20–5.82)
RDW: 14.8 % — AB (ref 11.0–14.6)
WBC: 3.7 10*3/uL — ABNORMAL LOW (ref 4.0–10.3)

## 2017-07-19 LAB — COMPREHENSIVE METABOLIC PANEL
ALBUMIN: 3.5 g/dL (ref 3.5–5.0)
ALT: 16 U/L (ref 0–55)
ANION GAP: 7 (ref 3–11)
AST: 17 U/L (ref 5–34)
Alkaline Phosphatase: 109 U/L (ref 40–150)
BILIRUBIN TOTAL: 0.5 mg/dL (ref 0.2–1.2)
BUN: 24 mg/dL (ref 7–26)
CO2: 29 mmol/L (ref 22–29)
Calcium: 9.5 mg/dL (ref 8.4–10.4)
Chloride: 107 mmol/L (ref 98–109)
Creatinine, Ser: 1.49 mg/dL — ABNORMAL HIGH (ref 0.70–1.30)
GFR calc Af Amer: 55 mL/min — ABNORMAL LOW (ref >60)
GFR calc non Af Amer: 48 mL/min — ABNORMAL LOW (ref >60)
GLUCOSE: 104 mg/dL (ref 70–140)
POTASSIUM: 4 mmol/L (ref 3.5–5.1)
SODIUM: 143 mmol/L (ref 136–145)
TOTAL PROTEIN: 6.7 g/dL (ref 6.4–8.3)

## 2017-07-19 LAB — TSH: TSH: 2.879 u[IU]/mL (ref 0.320–4.118)

## 2017-07-19 MED ORDER — SODIUM CHLORIDE 0.9 % IV SOLN
1240.0000 mg | Freq: Once | INTRAVENOUS | Status: AC
  Administered 2017-07-19: 1240 mg via INTRAVENOUS
  Filled 2017-07-19: qty 20

## 2017-07-19 MED ORDER — SODIUM CHLORIDE 0.9% FLUSH
10.0000 mL | INTRAVENOUS | Status: DC | PRN
  Administered 2017-07-19: 10 mL
  Filled 2017-07-19: qty 10

## 2017-07-19 MED ORDER — HEPARIN SOD (PORK) LOCK FLUSH 100 UNIT/ML IV SOLN
500.0000 [IU] | Freq: Once | INTRAVENOUS | Status: AC | PRN
  Administered 2017-07-19: 500 [IU]
  Filled 2017-07-19: qty 5

## 2017-07-19 MED ORDER — SODIUM CHLORIDE 0.9 % IV SOLN
Freq: Once | INTRAVENOUS | Status: AC
  Administered 2017-07-19: 10:00:00 via INTRAVENOUS

## 2017-07-19 NOTE — Progress Notes (Signed)
[place] [phone]   [phone]  OFFICE PROGRESS NOTE  Lucianne Lei, MD [address] [place] [address]  DIAGNOSIS: Stage IIIA (T1a, N2, M0) non-small cell lung cancer, adenocarcinoma presented with right upper lobe lung nodule in addition to mediastinal lymphadenopathy diagnosed in March 2018.  Biomarker Findings Tumor Mutational Burden - TMB-Intermediate (8 Muts/Mb) Microsatellite Status - MS-Stable Genomic Findings For a complete list of the genes assayed, please refer to the Appendix. ERBB2 amplification - equivocal? DNMT3A K475f*192 FUBP1 Q40* KEAP1 [id number] TP53 C275F 7 Disease relevant genes with no reportable alterations: EGFR, KRAS, ALK, BRAF, MET, RET, ROS1   PRIOR THERAPY: course of concurrent chemoradiation with weekly carboplatin for AUC of 2 and paclitaxel 45 MG/M2. Status post 6 cycles. Last cycle was given 10/16/2016.  CURRENT THERAPY:  Consolidation treatment with immunotherapy with Imfinzi (Durvalumab) 10 MG/M2 every 2 weeks. First dose 12/21/2016.  Status post 14 cycles.  INTERVAL HISTORY: MiAugustin Bun89.o. male returns to the clinic today for follow-up visit.  The patient is feeling fine today with no specific complaints.  He denied having any chest pain, shortness breath, cough or hemoptysis.  He denied having any weight loss or night sweats.  He has no nausea, vomiting, diarrhea or constipation.  He continues to tolerate his treatment with immunotherapy fairly well.  The patient is here today for evaluation before starting cycle #15.   MEDICAL HISTORY: Past Medical History:  Diagnosis Date  . Adenocarcinoma of right lung, stage 3 ([facility]5/23/2018  . Adenocarcinoma of right lung, stage 3 (HCP[facility]5/23/2018  . Anemia   . Arthritis    "hx right hip"  . Asthma    "when I was a child"  . Atrial fibrillation (HCC)    Amiodarone started 10/2011; Coumadin  . Automatic implantable  cardioverter-defibrillator in situ 10/03/2012   a. St. Jude ICD implantation 10/03/12.  . Chronic anticoagulation   . Chronic fatigue 10/18/2016  . Chronic fatigue 10/18/2016  . Chronic systolic heart failure (HCAred[facility]   a. Echo 7/13: EF 25%;  b. echo 04/2012:  Mild LVH, EF 30-35%, Gr 1 DD, mild AI, mild MR, mild LAE  . COPD (chronic obstructive pulmonary disease) (HCHoff[facility]  . Dyslipidemia   . Dysrhythmia   . Encounter for antineoplastic chemotherapy 08/24/2016  . Gout   . History of blood transfusion 10/15/2013   "don't know where the blood's going; HgB down to 5"  . Hyperlipidemia   . Hypertension   . Hypothyroidism   . NICM (nonischemic cardiomyopathy) (HCCamp[facility]   LHSunnyv[facility]/14:  minimal CAD  . Obesity   . OSA on CPAP   . Tobacco abuse     ALLERGIES:  has No Known Allergies.  MEDICATIONS:  Current Outpatient Medications  Medication Sig Dispense Refill  . Acetaminophen (TYLENOL) 325 MG CAPS Take 325 mg by mouth daily as needed.    . Marland Kitchencetaminophen (TYLENOL) 500 MG tablet Take 1,000 mg by mouth every 6 (six) hours as needed for mild pain.    . Marland Kitchenlbuterol (PROVENTIL HFA;VENTOLIN HFA) 108 (90 Base) MCG/ACT inhaler Inhale 2 puffs into the lungs every 4 (four) hours as needed for wheezing or shortness of breath. 1 Inhaler 2  . amiodarone (PACERONE) 100 MG tablet Take 1 tablet (100 mg total) by mouth daily. Please keep upcoming appt with Dr. RoHarrington Challengern June for future refills. Thank you 90 tablet 0  . atorvastatin (LIPITOR) 20 MG tablet TAKE  1 TABLET BY MOUTH EVERY DAY 90 tablet 2  . carvedilol (COREG) 25 MG tablet TAKE 1 TABLET BY MOUTH TWICE A DAY WITH A MEAL 60 tablet 11  . [employer] D3 2000 units CAPS Take 2,000 Units by mouth daily.   11  . feeding supplement, ENSURE ENLIVE, (ENSURE ENLIVE) LIQD Take 237 mLs by mouth 2 (two) times daily between meals. 237 mL 12  . ferrous gluconate (FERGON) 324 MG tablet Take 1 tablet (324 mg total) by mouth 3 (three) times daily with meals. 90 tablet 8  .  Fluticasone-Umeclidin-Vilant (TRELEGY ELLIPTA) 100-62.5-25 MCG/INH AEPB Inhale 1 puff into the lungs daily. 1 each 6  . furosemide (LASIX) 20 MG tablet TAKE 3 TABLETS (60 MG) EVERY TUE AND SAT, ALL OTHER DAYS TAKE 2 TABLETS (40 MG). 64 tablet 7  . hydrALAZINE (APRESOLINE) 100 MG tablet Take 100 mg by mouth 3 (three) times daily.   1  . levothyroxine (SYNTHROID, LEVOTHROID) 50 MCG tablet TAKE 1 TABLET (50 MCG TOTAL) BY MOUTH DAILY BEFORE BREAKFAST. 90 tablet 3  . lidocaine-prilocaine (EMLA) cream Apply generous amount to port site at least 1 hr prior to treatment.  DO NOT RUB IN. 30 g 1  . lisinopril (PRINIVIL,ZESTRIL) 40 MG tablet TAKE 1 TABLET BY MOUTH DAILY. 30 tablet 0  . magnesium oxide (MAG-OX) 400 (241.3 Mg) MG tablet Take 1 tablet (400 mg total) by mouth daily. 30 tablet 0  . potassium chloride SA (KLOR-CON M20) 20 MEQ tablet TAKE 1 TABLET BY MOUTH EVERY DAY *ON DAYS WHEN LASIX IS TAKEN, TAKE 2 TABLETS*  Dr. Harrington Challenger for further refills. 3rd attempt 30 tablet 0  . prochlorperazine (COMPAZINE) 10 MG tablet Take 1 tablet (10 mg total) by mouth every 6 (six) hours as needed for nausea or vomiting. 30 tablet 0  . rivaroxaban (XARELTO) 20 MG TABS tablet Take 1 tablet (20 mg total) by mouth daily. 90 tablet 2  . sildenafil (VIAGRA) 100 MG tablet Take 100 mg by mouth.    . spironolactone (ALDACTONE) 25 MG tablet TAKE 0.5 TABLETS (12.5 MG TOTAL) BY MOUTH DAILY. 45 tablet 0  . sucralfate (CARAFATE) 1 g tablet TAKE 1 TABLET BY MOUTH FOUR TIMES DAILY (5 MINUTES BEFORE MEALS AND AT BEDTIME) FOR ESOPHAGITIS 120 tablet 2  . ULORIC 40 MG tablet Take 1 tablet (40 mg total) by mouth daily. 30 tablet 0   No current facility-administered medications for this visit.     SURGICAL HISTORY:  Past Surgical History:  Procedure Laterality Date  . CARDIAC DEFIBRILLATOR PLACEMENT  2014  . CARDIOVERSION  2011  . COLONOSCOPY WITH PROPOFOL Left 10/17/2013   Procedure: COLONOSCOPY WITH PROPOFOL;  Surgeon: Inda Castle,  MD;  Location: [facility];  Service: Endoscopy;  Laterality: Left;  . ESOPHAGOGASTRODUODENOSCOPY N/A 10/17/2013   Procedure: ESOPHAGOGASTRODUODENOSCOPY (EGD);  Surgeon: Inda Castle, MD;  Location: [facility];  Service: Endoscopy;  Laterality: N/A;  . GIVENS CAPSULE STUDY N/A 10/29/2013   Procedure: GIVENS CAPSULE STUDY;  Surgeon: Inda Castle, MD;  Location: [facility];  Service: Endoscopy;  Laterality: N/A;  . IMPLANTABLE CARDIOVERTER DEFIBRILLATOR IMPLANT Left 10/03/2012   Procedure: IMPLANTABLE CARDIOVERTER DEFIBRILLATOR IMPLANT;  Surgeon: Deboraha Sprang, MD;  Location: [facility] CATH LAB;  Service: Cardiovascular;  Laterality: Left;  . IR FLUORO GUIDE PORT INSERTION RIGHT  09/21/2016  . IR US GUIDE VASC ACCESS RIGHT  09/21/2016  . JOINT REPLACEMENT    . TONSILLECTOMY  1950's  . TOTAL HIP ARTHROPLASTY Right 11/25/1997  .  VIDEO BRONCHOSCOPY WITH ENDOBRONCHIAL ULTRASOUND N/A 08/09/2016   Procedure: VIDEO BRONCHOSCOPY WITH ENDOBRONCHIAL ULTRASOUND;  Surgeon: Javier Glazier, MD;  Location: [facility] OR;  Service: Thoracic;  Laterality: N/A;    REVIEW OF SYSTEMS:  A comprehensive review of systems was negative.   PHYSICAL EXAMINATION: General appearance: alert, cooperative and no distress Head: Normocephalic, without obvious abnormality, atraumatic Neck: no adenopathy, no JVD, supple, symmetrical, trachea midline and thyroid not enlarged, symmetric, no tenderness/mass/nodules Lymph nodes: Cervical, supraclavicular, and axillary nodes normal. Resp: clear to auscultation bilaterally Back: symmetric, no curvature. ROM normal. No CVA tenderness. Cardio: regular rate and rhythm, S1, S2 normal, no murmur, click, rub or gallop GI: soft, non-tender; bowel sounds normal; no masses,  no organomegaly Extremities: extremities normal, atraumatic, no cyanosis or edema  ECOG PERFORMANCE STATUS: 1 - Symptomatic but completely ambulatory  Blood pressure 138/71, pulse 66, temperature 98.1 F (36.7 C),  temperature source Oral, resp. rate 18, height 6' (1.829 m), weight 297 lb 11.2 oz (135 kg), SpO2 98 %.  LABORATORY DATA: Lab Results  Component Value Date   WBC 3.7 (L) 07/19/2017   HGB 12.1 (L) 07/19/2017   HCT 38.1 (L) 07/19/2017   MCV 92.7 07/19/2017   PLT 135 (L) 07/19/2017      Chemistry      Component Value Date/Time   NA 143 07/19/2017 0832   NA 141 03/29/2017 1038   K 4.0 07/19/2017 0832   K 4.0 03/29/2017 1038   CL 107 07/19/2017 0832   CO2 29 07/19/2017 0832   CO2 27 03/29/2017 1038   Russell 24 07/19/2017 0832   Russell 28.6 (H) 03/29/2017 1038   CREATININE 1.49 (H) 07/19/2017 0832   CREATININE 1.6 (H) 03/29/2017 1038      Component Value Date/Time   CALCIUM 9.5 07/19/2017 0832   CALCIUM 9.3 03/29/2017 1038   ALKPHOS 109 07/19/2017 0832   ALKPHOS 105 03/29/2017 1038   AST 17 07/19/2017 0832   AST 18 03/29/2017 1038   ALT 16 07/19/2017 0832   ALT 13 03/29/2017 1038   BILITOT 0.5 07/19/2017 0832   BILITOT 0.62 03/29/2017 1038       RADIOGRAPHIC STUDIES: Ct Chest W Contrast  Result Date: 06/20/2017 CLINICAL DATA:  Lung cancer, diagnosed March 2018, status post chemo radiation, on immunotherapy EXAM: CT CHEST WITH CONTRAST TECHNIQUE: Multidetector CT imaging of the chest was performed during intravenous contrast administration. CONTRAST:  6m ISOVUE-300 IOPAMIDOL (ISOVUE-300) INJECTION 61% COMPARISON:  03/23/2017 FINDINGS: Cardiovascular: Heart is normal in size.  No pericardial effusion. No evidence of thoracic aortic aneurysm. Mild atherosclerotic calcifications of the aortic arch. Left subclavian ICD. Mediastinum/Nodes: No suspicious mediastinal lymphadenopathy. Visualized thyroid is mildly enlarged. Lungs/Pleura: Radiation changes in the medial right hemithorax/paramediastinal region. Underlying nodular opacity is no longer evident. No suspicious pulmonary nodules. No focal consolidation. Mild centrilobular and paraseptal emphysematous changes, upper lobe  predominant. No pleural effusion or pneumothorax. Upper Abdomen: Visualized upper abdomen is notable for a 5 mm right upper pole renal calculus. Musculoskeletal: Degenerative changes of the visualized thoracolumbar spine. IMPRESSION: Radiation changes in the right hemithorax. No evidence of recurrent or metastatic disease. Aortic Atherosclerosis (ICD10-I70.0) and Emphysema (ICD10-J43.9). Electronically Signed   By: SJulian HyM.D.   On: 06/20/2017 10:00    ASSESSMENT AND PLAN:  This is a very pleasant 67years old African-American male with a stage IIIa non-small cell lung cancer, adenocarcinoma.  The patient completed 6 weeks of concurrent chemoradiation with weekly carboplatin and paclitaxel and tolerated his treatment  well except for odynophagia. The patient is currently on consolidation treatment with immunotherapy with Imfinzi (Durvalumab) status post 14 cycles.  He tolerated the last cycle of his treatment well with no concerning complaints. I recommended for him to proceed with cycle #15 today as a scheduled. The patient will come back for follow-up visit in 2 weeks for evaluation before starting cycle #16. He was advised to call immediately if he has any concerning symptoms in the interval. The patient voices understanding of current disease status and treatment options and is in agreement with the current care plan. All questions were answered. The patient knows to call the clinic with any problems, questions or concerns. We can certainly see the patient much sooner if necessary.  Disclaimer: This note was dictated with voice recognition software. Similar sounding words can inadvertently be transcribed and may not be corrected upon review.

## 2017-07-19 NOTE — Telephone Encounter (Signed)
Next 3 cycles already scheduled per 4/18 los.

## 2017-07-19 NOTE — Patient Instructions (Signed)
[facility] Discharge Instructions for Patients Receiving Chemotherapy  Today you received the following chemotherapy agents: Imfinzi.  To help prevent nausea and vomiting after your treatment, we encourage you to take your nausea medication as directed.   If you develop nausea and vomiting that is not controlled by your nausea medication, call the clinic.   BELOW ARE SYMPTOMS THAT SHOULD BE REPORTED IMMEDIATELY:  *FEVER GREATER THAN 100.5 F  *CHILLS WITH OR WITHOUT FEVER  NAUSEA AND VOMITING THAT IS NOT CONTROLLED WITH YOUR NAUSEA MEDICATION  *UNUSUAL SHORTNESS OF BREATH  *UNUSUAL BRUISING OR BLEEDING  TENDERNESS IN MOUTH AND THROAT WITH OR WITHOUT PRESENCE OF ULCERS  *URINARY PROBLEMS  *BOWEL PROBLEMS  UNUSUAL RASH Items with * indicate a potential emergency and should be followed up as soon as possible.  Feel free to call the clinic should you have any questions or concerns. The clinic phone number is [phone].  Please show the CHEMO ALERT CARD at check-in to the Emergency Department and triage nurse.   

## 2017-07-26 ENCOUNTER — Other Ambulatory Visit: Payer: Self-pay | Admitting: Internal Medicine

## 2017-08-02 ENCOUNTER — Inpatient Hospital Stay: Payer: Medicare Other | Attending: Internal Medicine

## 2017-08-02 ENCOUNTER — Other Ambulatory Visit: Payer: Self-pay

## 2017-08-02 ENCOUNTER — Telehealth: Payer: Self-pay | Admitting: Oncology

## 2017-08-02 ENCOUNTER — Encounter: Payer: Self-pay | Admitting: Oncology

## 2017-08-02 ENCOUNTER — Inpatient Hospital Stay (HOSPITAL_BASED_OUTPATIENT_CLINIC_OR_DEPARTMENT_OTHER): Payer: Medicare Other | Admitting: Oncology

## 2017-08-02 ENCOUNTER — Inpatient Hospital Stay: Payer: Medicare Other

## 2017-08-02 VITALS — BP 150/75 | HR 77 | Temp 98.0°F | Resp 17 | Ht 72.0 in | Wt 297.1 lb

## 2017-08-02 DIAGNOSIS — E785 Hyperlipidemia, unspecified: Secondary | ICD-10-CM | POA: Insufficient documentation

## 2017-08-02 DIAGNOSIS — Z7901 Long term (current) use of anticoagulants: Secondary | ICD-10-CM | POA: Insufficient documentation

## 2017-08-02 DIAGNOSIS — Z5112 Encounter for antineoplastic immunotherapy: Secondary | ICD-10-CM | POA: Insufficient documentation

## 2017-08-02 DIAGNOSIS — R5382 Chronic fatigue, unspecified: Secondary | ICD-10-CM | POA: Insufficient documentation

## 2017-08-02 DIAGNOSIS — Z79899 Other long term (current) drug therapy: Secondary | ICD-10-CM | POA: Diagnosis not present

## 2017-08-02 DIAGNOSIS — R1310 Dysphagia, unspecified: Secondary | ICD-10-CM | POA: Diagnosis not present

## 2017-08-02 DIAGNOSIS — C3411 Malignant neoplasm of upper lobe, right bronchus or lung: Secondary | ICD-10-CM

## 2017-08-02 DIAGNOSIS — G4733 Obstructive sleep apnea (adult) (pediatric): Secondary | ICD-10-CM | POA: Insufficient documentation

## 2017-08-02 DIAGNOSIS — I5022 Chronic systolic (congestive) heart failure: Secondary | ICD-10-CM | POA: Insufficient documentation

## 2017-08-02 DIAGNOSIS — J449 Chronic obstructive pulmonary disease, unspecified: Secondary | ICD-10-CM | POA: Diagnosis not present

## 2017-08-02 DIAGNOSIS — I110 Hypertensive heart disease with heart failure: Secondary | ICD-10-CM | POA: Diagnosis not present

## 2017-08-02 DIAGNOSIS — E039 Hypothyroidism, unspecified: Secondary | ICD-10-CM | POA: Diagnosis not present

## 2017-08-02 DIAGNOSIS — I4891 Unspecified atrial fibrillation: Secondary | ICD-10-CM | POA: Insufficient documentation

## 2017-08-02 LAB — CBC WITH DIFFERENTIAL/PLATELET
BASOS ABS: 0.1 10*3/uL (ref 0.0–0.1)
BASOS PCT: 2 %
Eosinophils Absolute: 0.1 10*3/uL (ref 0.0–0.5)
Eosinophils Relative: 2 %
HCT: 36.3 % — ABNORMAL LOW (ref 38.4–49.9)
HEMOGLOBIN: 12 g/dL — AB (ref 13.0–17.1)
Lymphocytes Relative: 11 %
Lymphs Abs: 0.4 10*3/uL — ABNORMAL LOW (ref 0.9–3.3)
MCH: 29.5 pg (ref 27.2–33.4)
MCHC: 32.9 g/dL (ref 32.0–36.0)
MCV: 89.6 fL (ref 79.3–98.0)
Monocytes Absolute: 0.6 10*3/uL (ref 0.1–0.9)
Monocytes Relative: 19 %
NEUTROS PCT: 66 %
Neutro Abs: 2.2 10*3/uL (ref 1.5–6.5)
Platelets: 132 10*3/uL — ABNORMAL LOW (ref 140–400)
RBC: 4.05 MIL/uL — AB (ref 4.20–5.82)
RDW: 14.8 % — ABNORMAL HIGH (ref 11.0–14.6)
WBC: 3.4 10*3/uL — AB (ref 4.0–10.3)

## 2017-08-02 LAB — COMPREHENSIVE METABOLIC PANEL
ALK PHOS: 105 U/L (ref 40–150)
ALT: 14 U/L (ref 0–55)
AST: 20 U/L (ref 5–34)
Albumin: 3.6 g/dL (ref 3.5–5.0)
Anion gap: 8 (ref 3–11)
BUN: 28 mg/dL — ABNORMAL HIGH (ref 7–26)
CALCIUM: 9.1 mg/dL (ref 8.4–10.4)
CO2: 25 mmol/L (ref 22–29)
CREATININE: 1.52 mg/dL — AB (ref 0.70–1.30)
Chloride: 110 mmol/L — ABNORMAL HIGH (ref 98–109)
GFR calc Af Amer: 54 mL/min — ABNORMAL LOW (ref >60)
GFR, EST NON AFRICAN AMERICAN: 46 mL/min — AB (ref >60)
Glucose, Bld: 97 mg/dL (ref 70–140)
Potassium: 3.7 mmol/L (ref 3.5–5.1)
Sodium: 143 mmol/L (ref 136–145)
TOTAL PROTEIN: 6.5 g/dL (ref 6.4–8.3)
Total Bilirubin: 0.5 mg/dL (ref 0.2–1.2)

## 2017-08-02 MED ORDER — HEPARIN SOD (PORK) LOCK FLUSH 100 UNIT/ML IV SOLN
500.0000 [IU] | Freq: Once | INTRAVENOUS | Status: AC | PRN
  Administered 2017-08-02: 500 [IU]
  Filled 2017-08-02: qty 5

## 2017-08-02 MED ORDER — SODIUM CHLORIDE 0.9% FLUSH
10.0000 mL | INTRAVENOUS | Status: DC | PRN
  Administered 2017-08-02: 10 mL
  Filled 2017-08-02: qty 10

## 2017-08-02 MED ORDER — SODIUM CHLORIDE 0.9 % IV SOLN
Freq: Once | INTRAVENOUS | Status: AC
Start: 2017-08-02 — End: 2017-08-02
  Administered 2017-08-02: 10:00:00 via INTRAVENOUS

## 2017-08-02 MED ORDER — SODIUM CHLORIDE 0.9 % IV SOLN
9.5000 mg/kg | Freq: Once | INTRAVENOUS | Status: AC
  Administered 2017-08-02: 1240 mg via INTRAVENOUS
  Filled 2017-08-02: qty 4.8

## 2017-08-02 NOTE — Telephone Encounter (Signed)
3 cycles already scheduled per 5/2 los - no additional appts added.

## 2017-08-02 NOTE — Progress Notes (Signed)
Per Dr. Julien Nordmann, ok to treat with creatinine of 1.52

## 2017-08-02 NOTE — Progress Notes (Signed)
[facility] OFFICE PROGRESS NOTE  Lucianne Lei, MD [address] [place] [address]  DIAGNOSIS: Stage IIIA (T1a, N2, M0) non-small cell lung cancer, adenocarcinoma presented with right upper lobe lung nodule in addition to mediastinal lymphadenopathy diagnosed in March 2018.  Biomarker Findings Tumor Mutational Burden - TMB-Intermediate (8 Muts/Mb) Microsatellite Status - MS-Stable Genomic Findings For a complete list of the genes assayed, please refer to the Appendix. ERBB2 amplification - equivocal? DNMT3A K468f*192 FUBP1 Q40* KEAP1 [id number] TP53 C275F 7 Disease relevant genes with no reportable alterations: EGFR, KRAS, ALK, BRAF, MET, RET, ROS1  PRIOR THERAPY: course of concurrent chemoradiation with weekly carboplatin for AUC of 2 and paclitaxel 45 MG/M2. Status post 6 cycles. Last cycle was given 10/16/2016.  CURRENT THERAPY: Consolidation treatment with immunotherapy with Imfinzi (Durvalumab) 10 MG/M2 every 2 weeks. First dose 12/21/2016.  Status post 15 cycles.  INTERVAL HISTORY: Jason Yeates89.o. 67 male returns for follow up visit by himself.  Patient feels fine today with no specific complaints.  Patient denies fevers and chills.  Denies chest pain, shortness breath, cough, hemoptysis.  Denies nausea, vomiting, constipation, diarrhea.  Denies recent weight loss or night sweats.  Patient is here for evaluation prior to cycle #16 of his treatment.  MEDICAL HISTORY: Past Medical History:  Diagnosis Date  . Adenocarcinoma of right lung, stage 3 ([facility]/23/2018  . Adenocarcinoma of right lung, stage 3 ([facility]5/23/2018  . Anemia   . Arthritis    "hx right hip"  . Asthma    "when I was a child"  . Atrial fibrillation (HCC)    Amiodarone started 10/2011; Coumadin  . 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 St. Jude ICD implantation 10/03/12.  . Chronic anticoagulation   . Chronic fatigue 10/18/2016  . Chronic  fatigue 10/18/2016  . Chronic systolic heart failure ([facility]   a. Echo 7/13: EF 25%;  b. echo 04/2012:  Mild LVH, EF 30-35%, Gr 1 DD, mild AI, mild MR, mild LAE  . COPD (chronic obstructive pulmonary disease) ([facility]  . Dyslipidemia   . Dysrhythmia   . Encounter for antineoplastic chemotherapy 08/24/2016  . Gout   . History of blood transfusion 10/15/2013   "don't know where the blood's going; HgB down to 5"  . Hyperlipidemia   . Hypertension   . Hypothyroidism   . NICM (nonischemic cardiomyopathy) ([facility]   [facility]/14:  minimal CAD  . Obesity   . OSA on CPAP   . Tobacco abuse     ALLERGIES:  has No Known Allergies.  MEDICATIONS:  Current Outpatient Medications  Medication Sig Dispense Refill  . acetaminophen (TYLENOL) 500 MG tablet Take 1,000 mg by mouth every 6 (six) hours as needed for mild pain.    . Marland Kitchenlbuterol (PROVENTIL HFA;VENTOLIN HFA) 108 (90 Base) MCG/ACT inhaler Inhale 2 puffs into the lungs every 4 (four) hours as needed for wheezing or shortness of breath. 1 Inhaler 2  . amiodarone (PACERONE) 100 MG tablet Take 1 tablet (100 mg total) by mouth daily. Please keep upcoming appt with Dr. RoHarrington Challengern June for future refills. Thank you 90 tablet 0  . atorvastatin (LIPITOR) 20 MG tablet TAKE 1 TABLET BY MOUTH EVERY DAY 90 tablet 2  . carvedilol (COREG) 25 MG tablet TAKE 1 TABLET BY MOUTH TWICE A DAY WITH A MEAL 60 tablet 11  . [employer] D3 2000 units CAPS Take 2,000 Units by mouth daily.   11  .  feeding supplement, ENSURE ENLIVE, (ENSURE ENLIVE) LIQD Take 237 mLs by mouth 2 (two) times daily between meals. 237 mL 12  . ferrous gluconate (FERGON) 324 MG tablet Take 1 tablet (324 mg total) by mouth 3 (three) times daily with meals. 90 tablet 8  . Fluticasone-Umeclidin-Vilant (TRELEGY ELLIPTA) 100-62.5-25 MCG/INH AEPB Inhale 1 puff into the lungs daily. 1 each 6  . furosemide (LASIX) 20 MG tablet TAKE 3 TABLETS (60 MG) EVERY TUE AND SAT, ALL OTHER DAYS TAKE 2 TABLETS (40 MG). 64 tablet 7  .  hydrALAZINE (APRESOLINE) 100 MG tablet Take 100 mg by mouth 3 (three) times daily.   1  . levothyroxine (SYNTHROID, LEVOTHROID) 50 MCG tablet TAKE 1 TABLET (50 MCG TOTAL) BY MOUTH DAILY BEFORE BREAKFAST. 90 tablet 3  . lidocaine-prilocaine (EMLA) cream Apply generous amount to port site at least 1 hr prior to treatment.  DO NOT RUB IN. 30 g 1  . lisinopril (PRINIVIL,ZESTRIL) 40 MG tablet Take 1 tablet (40 mg total) by mouth daily. Please keep 09/2017 appointment for further refills 30 tablet 1  . magnesium oxide (MAG-OX) 400 (241.3 Mg) MG tablet Take 1 tablet (400 mg total) by mouth daily. 30 tablet 0  . potassium chloride SA (KLOR-CON M20) 20 MEQ tablet TAKE 1 TABLET BY MOUTH EVERY DAY *ON DAYS WHEN LASIX IS TAKEN, TAKE 2 TABLETS*  Dr. Harrington Challenger for further refills. 3rd attempt 30 tablet 0  . prochlorperazine (COMPAZINE) 10 MG tablet Take 1 tablet (10 mg total) by mouth every 6 (six) hours as needed for nausea or vomiting. 30 tablet 0  . rivaroxaban (XARELTO) 20 MG TABS tablet Take 1 tablet (20 mg total) by mouth daily. 90 tablet 2  . sildenafil (VIAGRA) 100 MG tablet Take 100 mg by mouth.    . spironolactone (ALDACTONE) 25 MG tablet TAKE 0.5 TABLETS (12.5 MG TOTAL) BY MOUTH DAILY. 45 tablet 0  . sucralfate (CARAFATE) 1 g tablet TAKE 1 TABLET BY MOUTH FOUR TIMES DAILY (5 MINUTES BEFORE MEALS AND AT BEDTIME) FOR ESOPHAGITIS 120 tablet 2  . ULORIC 40 MG tablet Take 1 tablet (40 mg total) by mouth daily. 30 tablet 0   No current facility-administered medications for this visit.     SURGICAL HISTORY:  Past Surgical History:  Procedure Laterality Date  . CARDIAC DEFIBRILLATOR PLACEMENT  2014  . CARDIOVERSION  2011  . COLONOSCOPY WITH PROPOFOL Left 10/17/2013   Procedure: COLONOSCOPY WITH PROPOFOL;  Surgeon: Inda Castle, MD;  Location: [facility];  Service: Endoscopy;  Laterality: Left;  . ESOPHAGOGASTRODUODENOSCOPY N/A 10/17/2013   Procedure: ESOPHAGOGASTRODUODENOSCOPY (EGD);  Surgeon: Inda Castle, MD;  Location: [facility];  Service: Endoscopy;  Laterality: N/A;  . GIVENS CAPSULE STUDY N/A 10/29/2013   Procedure: GIVENS CAPSULE STUDY;  Surgeon: Inda Castle, MD;  Location: [facility] ENDOS[facility];  Service: Endoscopy;  Laterality: N/A;  . IMPLANTABLE CARDIOVERTER DEFIBRILLATOR IMPLANT Left 10/03/2012   Procedure: IMPLANTABLE CARDIOVERTER DEFIBRILLATOR IMPLANT;  Surgeon: Deboraha Sprang, MD;  [facility] C[facility] LAB;  Service: Cardiovascular;  Laterality: Left;  . IR FLUORO GUIDE PORT INSERTION RIGHT  09/21/2016  . IR US GUIDE VASC ACCESS RIGHT  09/21/2016  . JOINT REPLACEMENT    . TONSILLECTOMY  1950's  . TOTAL HIP ARTHROPLASTY Right 11/25/1997  . VIDEO BRONCHOSCOPY WITH ENDOBRONCHIAL ULTRASOUND N/A 08/09/2016   Procedure: VIDEO BRONCHOSCOPY WITH ENDOBRONCHIAL ULTRASOUND;  Surgeon: Javier Glazier, MD;  Location: [facility];  Service: Thoracic;  Laterality: N/A;    REVIEW OF SYSTEMS:  Review of Systems  Constitutional: Negative for appetite change, chills, fatigue, fever and unexpected weight change.  HENT:   Negative for mouth sores, nosebleeds, sore throat and trouble swallowing.   Eyes: Negative for eye problems and icterus.  Respiratory: Negative for cough, hemoptysis, shortness of breath and wheezing.   Cardiovascular: Negative for chest pain and leg swelling.  Gastrointestinal: Negative for abdominal pain, constipation, diarrhea, nausea and vomiting.  Genitourinary: Negative for bladder incontinence, difficulty urinating, dysuria, frequency and hematuria.   Musculoskeletal: Negative for back pain, gait problem, neck pain and neck stiffness.  Skin: Negative for itching and rash.  Neurological: Negative for dizziness, extremity weakness, gait problem, headaches, light-headedness and seizures.  Hematological: Negative for adenopathy. Does not bruise/bleed easily.  Psychiatric/Behavioral: Negative for confusion, depression and sleep disturbance. The patient is not nervous/anxious.      PHYSICAL EXAMINATION:  Blood pressure (!) 150/75, pulse 77, temperature 98 F (36.7 C), temperature source Oral, resp. rate 17, height 6' (1.829 m), weight 297 lb 1.6 oz (134.8 kg), SpO2 100 %.  ECOG PERFORMANCE STATUS: 1 - Symptomatic but completely ambulatory  Physical Exam  Constitutional: Oriented to person, place, and time and well-developed, well-nourished, and in no distress. No distress.  HENT:  Head: Normocephalic and atraumatic.  Mouth/Throat: Oropharynx is clear and moist. No oropharyngeal exudate.  Eyes: Conjunctivae are normal. Right eye exhibits no discharge. Left eye exhibits no discharge. No scleral icterus.  Neck: Normal range of motion. Neck supple.  Cardiovascular: Normal rate, regular rhythm, normal heart sounds and intact distal pulses.   Pulmonary/Chest: Effort normal and breath sounds normal. No respiratory distress. No wheezes. No rales.  Abdominal: Soft. Bowel sounds are normal. Exhibits no distension and no mass. There is no tenderness.  Musculoskeletal: Normal range of motion. Exhibits no edema.  Lymphadenopathy:    No cervical adenopathy.  Neurological: Alert and oriented to person, place, and time. Exhibits normal muscle tone. Gait normal. Coordination normal.  Skin: Skin is warm and dry. No rash noted. Not diaphoretic. No erythema. No pallor.  Psychiatric: Mood, memory and judgment normal.  Vitals reviewed.  LABORATORY DATA: Lab Results  Component Value Date   WBC 3.4 (L) 08/02/2017   HGB 12.0 (L) 08/02/2017   HCT 36.3 (L) 08/02/2017   MCV 89.6 08/02/2017   PLT 132 (L) 08/02/2017      Chemistry      Component Value Date/Time   NA 143 07/19/2017 0832   NA 141 03/29/2017 1038   K 4.0 07/19/2017 0832   K 4.0 03/29/2017 1038   CL 107 07/19/2017 0832   CO2 29 07/19/2017 0832   CO2 27 03/29/2017 1038   BUN 24 07/19/2017 0832   BUN 28.6 (H) 03/29/2017 1038   CREATININE 1.49 (H) 07/19/2017 0832   CREATININE 1.6 (H) 03/29/2017 1038       Component Value Date/Time   CALCIUM 9.5 07/19/2017 0832   CALCIUM 9.3 03/29/2017 1038   ALKPHOS 109 07/19/2017 0832   ALKPHOS 105 03/29/2017 1038   AST 17 07/19/2017 0832   AST 18 03/29/2017 1038   ALT 16 07/19/2017 0832   ALT 13 03/29/2017 1038   BILITOT 0.5 07/19/2017 0832   BILITOT 0.62 03/29/2017 1038       RADIOGRAPHIC STUDIES:  No results found.   ASSESSMENT/PLAN:  Primary cancer of right upper lobe of lung ([place]) This is a very pleasant 68 year old African-American male with a stage IIIa non-small cell lung cancer, adenocarcinoma.  The patient completed 6 weeks of  concurrent chemoradiation with weekly carboplatin and paclitaxel and tolerated his treatment well except for odynophagia. The patient is currently on consolidation treatment with immunotherapy with Imfinzi (Durvalumab) status post 15 cycles.  He tolerated the last cycle of his treatment well with no concerning complaints. I recommended for him to proceed with cycle #16 today as a scheduled. The patient will come back for follow-up visit in 2 weeks for evaluation before starting cycle #17. He was advised to call immediately if he has any concerning symptoms in the interval. The patient voices understanding of current disease status and treatment options and is in agreement with the current care plan. All questions were answered. The patient knows to call the clinic with any problems, questions or concerns. We can certainly see the patient much sooner if necessary.   No orders of the defined types were placed in this encounter.  Mikey Bussing, DNP, AGPCNP-BC, AOCNP 08/02/17

## 2017-08-02 NOTE — Assessment & Plan Note (Signed)
This is a very pleasant 67 year old African-American male with a stage IIIa non-small cell lung cancer, adenocarcinoma.  The patient completed 6 weeks of concurrent chemoradiation with weekly carboplatin and paclitaxel and tolerated his treatment well except for odynophagia. The patient is currently on consolidation treatment with immunotherapy with Imfinzi (Durvalumab) status post 15 cycles.  He tolerated the last cycle of his treatment well with no concerning complaints. I recommended for him to proceed with cycle #16 today as a scheduled. The patient will come back for follow-up visit in 2 weeks for evaluation before starting cycle #17. He was advised to call immediately if he has any concerning symptoms in the interval. The patient voices understanding of current disease status and treatment options and is in agreement with the current care plan. All questions were answered. The patient knows to call the clinic with any problems, questions or concerns. We can certainly see the patient much sooner if necessary.

## 2017-08-06 ENCOUNTER — Other Ambulatory Visit: Payer: Self-pay

## 2017-08-06 MED ORDER — FERROUS GLUCONATE 324 (38 FE) MG PO TABS: 324.0000 mg | ORAL_TABLET | Freq: Three times a day (TID) | ORAL | 8 refills | Status: DC

## 2017-08-08 ENCOUNTER — Telehealth: Payer: Self-pay | Admitting: Adult Health

## 2017-08-08 MED ORDER — FLUTICASONE-UMECLIDIN-VILANT 100-62.5-25 MCG/INH IN AEPB: 1.0000 | INHALATION_SPRAY | Freq: Every day | RESPIRATORY_TRACT | 6 refills | Status: DC

## 2017-08-08 NOTE — Telephone Encounter (Signed)
rx was refilled and I spoke with the pt and notified that this was done

## 2017-08-09 ENCOUNTER — Ambulatory Visit (INDEPENDENT_AMBULATORY_CARE_PROVIDER_SITE_OTHER): Payer: Medicare Other

## 2017-08-09 DIAGNOSIS — I5022 Chronic systolic (congestive) heart failure: Secondary | ICD-10-CM | POA: Diagnosis not present

## 2017-08-09 DIAGNOSIS — Z95810 Presence of automatic (implantable) cardiac defibrillator: Secondary | ICD-10-CM

## 2017-08-09 NOTE — Progress Notes (Signed)
EPIC Encounter for ICM Monitoring  Patient Name: Jason Russell is a 67 y.o. male Date: 08/09/2017 Primary Care Physican: Lucianne Lei, MD Primary Cardiologist:Ross Electrophysiologist: Faustino Congress DVOUZH:460 lbs  Heart Failure questions reviewed, pt asymptomatic.  He reported feeling great at this time.    Thoracic impedance normal.  Prescribed dosage: Furosemide 20 mg 3 tablets (60 mg total) every Tuesday and Saturday and 40 mg all the other days. Potassium 20 mEq 1 tablet every day and on days when lasix is taken, take 2 tablets  Labs: 08/02/2017 Creatinine 1.52, BUN 28, Potassium 3.7, Sodium 143, EGFR 46-54 07/19/2017 Creatinine 1.49, BUN 24, Potassium 4.0, Sodium 143, EGFR 48-55 07/05/2017 Creatinine 1.65, BUN 25, Potassium 3.7, Sodium 143, EGFR 42-49 06/21/2017 Creatinine 1.60, BUN 27, Potassium 4.0, Sodium 141, EGFR 44-51  06/07/2017 Creatinine 1.33, BUN 29, Potassium 4.0, Sodium 143, EGFR 55->60  05/24/2017 Creatinine 1.60, BUN 25, Potassium 4.2, Sodium 142, EGFR 44-51  05/10/2017 Creatinine 1.54, BUN 26, Potassium 3.4, Sodium 143, EGFR 46-53  04/26/2017 Creatinine1.52, BUN29, Potassium3.9, [id number], [id number] 01/10/2019Creatinine 1.72, BUN37, Potassium3.6, Sodium142, [id number] A complete set of results can be found in results review.  Recommendations: No changes.  Encouraged to call for fluid symptoms.  Follow-up plan: ICM clinic phone appointment on 09/10/2017.    Copy of ICM check sent to Dr. Caryl Comes.   3 month ICM trend: 08/09/2017    1 Year ICM trend:       [name], RN 08/09/2017 9:05 AM

## 2017-08-16 ENCOUNTER — Inpatient Hospital Stay (HOSPITAL_BASED_OUTPATIENT_CLINIC_OR_DEPARTMENT_OTHER): Payer: Medicare Other | Admitting: Oncology

## 2017-08-16 ENCOUNTER — Telehealth: Payer: Self-pay | Admitting: Oncology

## 2017-08-16 ENCOUNTER — Inpatient Hospital Stay: Payer: Medicare Other

## 2017-08-16 VITALS — BP 149/80 | HR 70 | Temp 98.2°F | Resp 17 | Ht 72.0 in | Wt 296.2 lb

## 2017-08-16 DIAGNOSIS — K209 Esophagitis, unspecified without bleeding: Secondary | ICD-10-CM

## 2017-08-16 DIAGNOSIS — C3411 Malignant neoplasm of upper lobe, right bronchus or lung: Secondary | ICD-10-CM | POA: Diagnosis not present

## 2017-08-16 DIAGNOSIS — I110 Hypertensive heart disease with heart failure: Secondary | ICD-10-CM | POA: Diagnosis not present

## 2017-08-16 DIAGNOSIS — R1310 Dysphagia, unspecified: Secondary | ICD-10-CM | POA: Diagnosis not present

## 2017-08-16 DIAGNOSIS — R5382 Chronic fatigue, unspecified: Secondary | ICD-10-CM | POA: Diagnosis not present

## 2017-08-16 DIAGNOSIS — J449 Chronic obstructive pulmonary disease, unspecified: Secondary | ICD-10-CM | POA: Diagnosis not present

## 2017-08-16 DIAGNOSIS — Z5112 Encounter for antineoplastic immunotherapy: Secondary | ICD-10-CM | POA: Diagnosis not present

## 2017-08-16 LAB — CBC WITH DIFFERENTIAL/PLATELET
BASOS ABS: 0 10*3/uL (ref 0.0–0.1)
Basophils Relative: 0 %
Eosinophils Absolute: 0.1 10*3/uL (ref 0.0–0.5)
Eosinophils Relative: 2 %
HEMATOCRIT: 36.7 % — AB (ref 38.4–49.9)
Hemoglobin: 11.7 g/dL — ABNORMAL LOW (ref 13.0–17.1)
Lymphocytes Relative: 11 %
Lymphs Abs: 0.4 10*3/uL — ABNORMAL LOW (ref 0.9–3.3)
MCH: 29.4 pg (ref 27.2–33.4)
MCHC: 31.9 g/dL — ABNORMAL LOW (ref 32.0–36.0)
MCV: 92.2 fL (ref 79.3–98.0)
MONO ABS: 0.7 10*3/uL (ref 0.1–0.9)
MONOS PCT: 18 %
NEUTROS ABS: 2.8 10*3/uL (ref 1.5–6.5)
Neutrophils Relative %: 69 %
Platelets: 136 10*3/uL — ABNORMAL LOW (ref 140–400)
RBC: 3.98 MIL/uL — ABNORMAL LOW (ref 4.20–5.82)
RDW: 14.6 % (ref 11.0–14.6)
WBC: 4.1 10*3/uL (ref 4.0–10.3)

## 2017-08-16 LAB — COMPREHENSIVE METABOLIC PANEL
ALT: 16 U/L (ref 0–55)
ANION GAP: 5 (ref 3–11)
AST: 18 U/L (ref 5–34)
Albumin: 3.5 g/dL (ref 3.5–5.0)
Alkaline Phosphatase: 103 U/L (ref 40–150)
BUN: 23 mg/dL (ref 7–26)
CHLORIDE: 108 mmol/L (ref 98–109)
CO2: 29 mmol/L (ref 22–29)
Calcium: 9.3 mg/dL (ref 8.4–10.4)
Creatinine, Ser: 1.52 mg/dL — ABNORMAL HIGH (ref 0.70–1.30)
GFR, EST AFRICAN AMERICAN: 54 mL/min — AB (ref >60)
GFR, EST NON AFRICAN AMERICAN: 46 mL/min — AB (ref >60)
Glucose, Bld: 90 mg/dL (ref 70–140)
Potassium: 4 mmol/L (ref 3.5–5.1)
SODIUM: 142 mmol/L (ref 136–145)
Total Bilirubin: 0.4 mg/dL (ref 0.2–1.2)
Total Protein: 6.3 g/dL — ABNORMAL LOW (ref 6.4–8.3)

## 2017-08-16 LAB — TSH: TSH: 2.282 u[IU]/mL (ref 0.320–4.118)

## 2017-08-16 MED ORDER — SODIUM CHLORIDE 0.9% FLUSH
10.0000 mL | INTRAVENOUS | Status: DC | PRN
  Administered 2017-08-16: 10 mL
  Filled 2017-08-16: qty 10

## 2017-08-16 MED ORDER — SUCRALFATE 1 G PO TABS: ORAL_TABLET | ORAL | 0 refills | Status: DC

## 2017-08-16 MED ORDER — SODIUM CHLORIDE 0.9 % IV SOLN
9.5000 mg/kg | Freq: Once | INTRAVENOUS | Status: AC
  Administered 2017-08-16: 1240 mg via INTRAVENOUS
  Filled 2017-08-16: qty 20

## 2017-08-16 MED ORDER — SODIUM CHLORIDE 0.9 % IV SOLN
Freq: Once | INTRAVENOUS | Status: AC
  Administered 2017-08-16: 14:00:00 via INTRAVENOUS

## 2017-08-16 MED ORDER — HEPARIN SOD (PORK) LOCK FLUSH 100 UNIT/ML IV SOLN
500.0000 [IU] | Freq: Once | INTRAVENOUS | Status: AC | PRN
  Administered 2017-08-16: 500 [IU]
  Filled 2017-08-16: qty 5

## 2017-08-16 NOTE — Assessment & Plan Note (Addendum)
This is a very pleasant 67 year old African-American male with a stage IIIa non-small cell lung cancer, adenocarcinoma.  The patient completed 6 weeks of concurrent chemoradiation with weekly carboplatin and paclitaxel and tolerated his treatment well except for odynophagia. The patient is currently on consolidation treatment with immunotherapy with Imfinzi (Durvalumab) status post 16cycles.  He tolerated the last cycle of his treatment well with no concerning complaints. I recommended for him to proceed with cycle #17 today as a scheduled. The patient will come back for follow-up visit in 2 weeks for evaluation before starting cycle #18.  For his difficulty swallowing, I recommend that he contact pulmonology to discuss this.  This could be a side effect related to his inhaler.  I do not see any sign of mucositis or thrush on exam today.  I refilled his Carafate for him today.  He was advised to call immediately if he has any concerning symptoms in the interval. The patient voices understanding of current disease status and treatment options and is in agreement with the current care plan. All questions were answered. The patient knows to call the clinic with any problems, questions or concerns. We can certainly see the patient much sooner if necessary.

## 2017-08-16 NOTE — Progress Notes (Signed)
[place] OFFICE PROGRESS NOTE  Lucianne Lei, MD [address] [place] [address]  DIAGNOSIS: Stage IIIA (T1a, N2, M0) non-small cell lung cancer, adenocarcinoma presented with right upper lobe lung nodule in addition to mediastinal lymphadenopathy diagnosed in March 2018.  Biomarker Findings Tumor Mutational Burden - TMB-Intermediate (8 Muts/Mb) Microsatellite Status - MS-Stable Genomic Findings For a complete list of the genes assayed, please refer to the Appendix. ERBB2 amplification - equivocal? DNMT3A K455f*192 FUBP1 Q40* KEAP1 [id number] TP53 C275F 7 Disease relevant genes with no reportable alterations: EGFR, KRAS, ALK, BRAF, MET, RET, ROS1  PRIOR THERAPY: course of concurrent chemoradiation with weekly carboplatin for AUC of 2 and paclitaxel 45 MG/M2. Status post 6 cycles. Last cycle was given 10/16/2016.  CURRENT THERAPY: Consolidation treatment with immunotherapy with Imfinzi (Durvalumab) 10 MG/M2 every 2 weeks. First dose 12/21/2016. Status post 16cycles.  INTERVAL HISTORY: [name]67.o. male returns for routine follow-up visit by himself.  The patient is feeling fine today and has no specific complaints except for some difficulty swallowing.  He noticed that this occurred after he resumed treatment with Trelegy.  He denies mouth sores and thrush.  He reports that his throat feels sore but does not have a sensation of food getting stuck.  He is using Carafate which is helping.  He requests a refill on this today.  She denies fevers and chills.  Denies chest pain, shortness breath, cough, hemoptysis.  Denies nausea, vomiting, constipation, diarrhea.  Denies recent weight loss or night sweats.  The patient continues to tolerate treatment with Imfinzi fairly well.  The patient is here for evaluation prior to cycle #17 of his treatment.  MEDICAL HISTORY: Past Medical History:  Diagnosis Date  . Adenocarcinoma of right lung, stage 3 ([facility] 08/23/2016  . Adenocarcinoma of right lung, stage 3 ([facility]5/23/2018  . Anemia   . Arthritis    "hx right hip"  . Asthma    "when I was a child"  . Atrial fibrillation (HCC)    Amiodarone started 10/2011; Coumadin  . 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 St. Jude ICD implantation 10/03/12.  . Chronic anticoagulation   . Chronic fatigue 10/18/2016  . Chronic fatigue 10/18/2016  . Chronic systolic heart failure ([facility]   a. Echo 7/13: EF 25%;  b. echo 04/2012:  Mild LVH, EF 30-35%, Gr 1 DD, mild AI, mild MR, mild LAE  . COPD (chronic obstructive pulmonary disease) ([facility]  . Dyslipidemia   . Dysrhythmia   . Encounter for antineoplastic chemotherapy 08/24/2016  . Gout   . History of blood transfusion 10/15/2013   "don't know where the blood's going; HgB down to 5"  . Hyperlipidemia   . Hypertension   . Hypothyroidism   . NICM (nonischemic cardiomyopathy) ([facility]is[facility]   [facility]/14:  minimal CAD  . Obesity   . OSA on CPAP   . Tobacco abuse     ALLERGIES:  has No Known Allergies.  MEDICATIONS:  Current Outpatient Medications  Medication Sig Dispense Refill  . acetaminophen (TYLENOL) 500 MG tablet Take 1,000 mg by mouth every 6 (six) hours as needed for mild pain.    . Marland Kitchenlbuterol (PROVENTIL HFA;VENTOLIN HFA) 108 (90 Base) MCG/ACT inhaler Inhale 2 puffs into the lungs every 4 (four) hours as needed for wheezing or shortness of breath. (Patient not taking: Reported on 08/02/2017) 1 Inhaler 2  . amiodarone (PACERONE) 100 MG tablet Take 1 tablet (100  mg total) by mouth daily. Please keep upcoming appt with Dr. Harrington Challenger in June for future refills. Thank you 90 tablet 0  . atorvastatin (LIPITOR) 20 MG tablet TAKE 1 TABLET BY MOUTH EVERY DAY 90 tablet 2  . carvedilol (COREG) 25 MG tablet TAKE 1 TABLET BY MOUTH TWICE A DAY WITH A MEAL 60 tablet 11  . [employer] D3 2000 units CAPS Take 2,000 Units by mouth daily.   11  . feeding supplement, ENSURE ENLIVE, (ENSURE ENLIVE) LIQD Take 237  mLs by mouth 2 (two) times daily between meals. 237 mL 12  . ferrous gluconate (FERGON) 324 MG tablet Take 1 tablet (324 mg total) by mouth 3 (three) times daily with meals. 90 tablet 8  . Fluticasone-Umeclidin-Vilant (TRELEGY ELLIPTA) 100-62.5-25 MCG/INH AEPB Inhale 1 puff into the lungs daily. 1 each 6  . furosemide (LASIX) 20 MG tablet TAKE 3 TABLETS (60 MG) EVERY TUE AND SAT, ALL OTHER DAYS TAKE 2 TABLETS (40 MG). 64 tablet 7  . hydrALAZINE (APRESOLINE) 100 MG tablet Take 100 mg by mouth 3 (three) times daily.   1  . levothyroxine (SYNTHROID, LEVOTHROID) 50 MCG tablet TAKE 1 TABLET (50 MCG TOTAL) BY MOUTH DAILY BEFORE BREAKFAST. 90 tablet 3  . lidocaine-prilocaine (EMLA) cream Apply generous amount to port site at least 1 hr prior to treatment.  DO NOT RUB IN. 30 g 1  . lisinopril (PRINIVIL,ZESTRIL) 40 MG tablet Take 1 tablet (40 mg total) by mouth daily. Please keep 09/2017 appointment for further refills 30 tablet 1  . magnesium oxide (MAG-OX) 400 (241.3 Mg) MG tablet Take 1 tablet (400 mg total) by mouth daily. 30 tablet 0  . potassium chloride SA (KLOR-CON M20) 20 MEQ tablet TAKE 1 TABLET BY MOUTH EVERY DAY *ON DAYS WHEN LASIX IS TAKEN, TAKE 2 TABLETS*  Dr. Harrington Challenger for further refills. 3rd attempt 30 tablet 0  . prochlorperazine (COMPAZINE) 10 MG tablet Take 1 tablet (10 mg total) by mouth every 6 (six) hours as needed for nausea or vomiting. (Patient not taking: Reported on 08/02/2017) 30 tablet 0  . rivaroxaban (XARELTO) 20 MG TABS tablet Take 1 tablet (20 mg total) by mouth daily. 90 tablet 2  . sildenafil (VIAGRA) 100 MG tablet Take 100 mg by mouth.    . spironolactone (ALDACTONE) 25 MG tablet TAKE 0.5 TABLETS (12.5 MG TOTAL) BY MOUTH DAILY. 45 tablet 0  . sucralfate (CARAFATE) 1 g tablet TAKE 1 TABLET BY MOUTH FOUR TIMES DAILY (5 MINUTES BEFORE MEALS AND AT BEDTIME) FOR ESOPHAGITIS 120 tablet 0  . ULORIC 40 MG tablet Take 1 tablet (40 mg total) by mouth daily. 30 tablet 0   No current  facility-administered medications for this visit.    Facility-Administered Medications Ordered in Other Visits  Medication Dose Route Frequency Provider Last Rate Last Dose  . 0.9 %  sodium chloride infusion   Intravenous Once Curt Bears, MD      . durvalumab [facility] 1,240 mg in sodium chloride 0.9 % 100 mL chemo infusion  9.5 mg/kg (Treatment Plan Recorded) Intravenous Once Curt Bears, MD 125 mL/hr at 08/16/17 1440 1,240 mg at 08/16/17 1440  . heparin lock flush 100 unit/mL  500 Units Intracatheter Once PRN Curt Bears, MD      . sodium chloride flush (NS) 0.9 % injection 10 mL  10 mL Intracatheter PRN Curt Bears, MD        SURGICAL HISTORY:  Past Surgical History:  Procedure Laterality Date  . CARDIAC DEFIBRILLATOR PLACEMENT  2014  . CARDIOVERSION  2011  . COLONOSCOPY WITH PROPOFOL Left 10/17/2013   Procedure: COLONOSCOPY WITH PROPOFOL;  Surgeon: Inda Castle, MD;  Location: [facility];  Service: Endoscopy;  Laterality: Left;  . ESOPHAGOGASTRODUODENOSCOPY N/A 10/17/2013   Procedure: ESOPHAGOGASTRODUODENOSCOPY (EGD);  Surgeon: Inda Castle, MD;  Location: [facility];  Service: Endoscopy;  Laterality: N/A;  . GIVENS CAPSULE STUDY N/A 10/29/2013   Procedure: GIVENS CAPSULE STUDY;  Surgeon: Inda Castle, MD;  Location: [facility];  Service: Endoscopy;  Laterality: N/A;  . IMPLANTABLE CARDIOVERTER DEFIBRILLATOR IMPLANT Left 10/03/2012   Procedure: IMPLANTABLE CARDIOVERTER DEFIBRILLATOR IMPLANT;  Surgeon: Deboraha Sprang, MD;  Location: [facility] CATH LAB;  Service: Cardiovascular;  Laterality: Left;  . IR FLUORO GUIDE PORT INSERTION RIGHT  09/21/2016  . IR US GUIDE VASC ACCESS RIGHT  09/21/2016  . JOINT REPLACEMENT    . TONSILLECTOMY  1950's  . TOTAL HIP ARTHROPLASTY Right 11/25/1997  . VIDEO BRONCHOSCOPY WITH ENDOBRONCHIAL ULTRASOUND N/A 08/09/2016   Procedure: VIDEO BRONCHOSCOPY WITH ENDOBRONCHIAL ULTRASOUND;  Surgeon: Javier Glazier, MD;  Location: [facility] OR;   Service: Thoracic;  Laterality: N/A;    REVIEW OF SYSTEMS:   Review of Systems  Constitutional: Negative for appetite change, chills, fatigue, fever and unexpected weight change.  HENT:   Negative for mouth sores, nosebleeds.  Reports a sore throat and difficulty swallowing since resuming treatment with Trelegy.   Eyes: Negative for eye problems and icterus.  Respiratory: Negative for cough, hemoptysis, shortness of breath and wheezing.   Cardiovascular: Negative for chest pain and leg swelling.  Gastrointestinal: Negative for abdominal pain, constipation, diarrhea, nausea and vomiting.  Genitourinary: Negative for bladder incontinence, difficulty urinating, dysuria, frequency and hematuria.   Musculoskeletal: Negative for back pain, gait problem, neck pain and neck stiffness.  Skin: Negative for itching and rash.  Neurological: Negative for dizziness, extremity weakness, gait problem, headaches, light-headedness and seizures.  Hematological: Negative for adenopathy. Does not bruise/bleed easily.  Psychiatric/Behavioral: Negative for confusion, depression and sleep disturbance. The patient is not nervous/anxious.     PHYSICAL EXAMINATION:  Blood pressure (!) 149/80, pulse 70, temperature 98.2 F (36.8 C), temperature source Oral, resp. rate 17, height 6' (1.829 m), weight 296 lb 3.2 oz (134.4 kg), SpO2 100 %.  ECOG PERFORMANCE STATUS: 1 - Symptomatic but completely ambulatory  Physical Exam  Constitutional: Oriented to person, place, and time and well-developed, well-nourished, and in no distress. No distress.  HENT:  Head: Normocephalic and atraumatic.  Mouth/Throat: Oropharynx is clear and moist. No oropharyngeal exudate.  Eyes: Conjunctivae are normal. Right eye exhibits no discharge. Left eye exhibits no discharge. No scleral icterus.  Neck: Normal range of motion. Neck supple.  Cardiovascular: Normal rate, regular rhythm, normal heart sounds and intact distal pulses.    Pulmonary/Chest: Effort normal and breath sounds normal. No respiratory distress. No wheezes. No rales.  Abdominal: Soft. Bowel sounds are normal. Exhibits no distension and no mass. There is no tenderness.  Musculoskeletal: Normal range of motion. Exhibits no edema.  Lymphadenopathy:    No cervical adenopathy.  Neurological: Alert and oriented to person, place, and time. Exhibits normal muscle tone. Gait normal. Coordination normal.  Skin: Skin is warm and dry. No rash noted. Not diaphoretic. No erythema. No pallor.  Psychiatric: Mood, memory and judgment normal.  Vitals reviewed.  LABORATORY DATA: Lab Results  Component Value Date   WBC 4.1 08/16/2017   HGB 11.7 (L) 08/16/2017   HCT 36.7 (L) 08/16/2017   MCV 92.2 08/16/2017  PLT 136 (L) 08/16/2017      Chemistry      Component Value Date/Time   NA 142 08/16/2017 1220   NA 141 03/29/2017 1038   K 4.0 08/16/2017 1220   K 4.0 03/29/2017 1038   CL 108 08/16/2017 1220   CO2 29 08/16/2017 1220   CO2 27 03/29/2017 1038   BUN 23 08/16/2017 1220   BUN 28.6 (H) 03/29/2017 1038   CREATININE 1.52 (H) 08/16/2017 1220   CREATININE 1.6 (H) 03/29/2017 1038      Component Value Date/Time   CALCIUM 9.3 08/16/2017 1220   CALCIUM 9.3 03/29/2017 1038   ALKPHOS 103 08/16/2017 1220   ALKPHOS 105 03/29/2017 1038   AST 18 08/16/2017 1220   AST 18 03/29/2017 1038   ALT 16 08/16/2017 1220   ALT 13 03/29/2017 1038   BILITOT 0.4 08/16/2017 1220   BILITOT 0.62 03/29/2017 1038       RADIOGRAPHIC STUDIES:  No results found.   ASSESSMENT/PLAN:  Primary cancer of right upper lobe of lung ([place]) This is a very pleasant 67 year old African-American male with a stage IIIa non-small cell lung cancer, adenocarcinoma.  The patient completed 6 weeks of concurrent chemoradiation with weekly carboplatin and paclitaxel and tolerated his treatment well except for odynophagia. The patient is currently on consolidation treatment with immunotherapy  with Imfinzi (Durvalumab) status post 16cycles.  He tolerated the last cycle of his treatment well with no concerning complaints. I recommended for him to proceed with cycle #17 today as a scheduled. The patient will come back for follow-up visit in 2 weeks for evaluation before starting cycle #18.  For his difficulty swallowing, I recommend that he contact pulmonology to discuss this.  This could be a side effect related to his inhaler.  I do not see any sign of mucositis or thrush on exam today.  I refilled his Carafate for him today.  He was advised to call immediately if he has any concerning symptoms in the interval. The patient voices understanding of current disease status and treatment options and is in agreement with the current care plan. All questions were answered. The patient knows to call the clinic with any problems, questions or concerns. We can certainly see the patient much sooner if necessary.   No orders of the defined types were placed in this encounter.  Mikey Bussing, DNP, AGPCNP-BC, AOCNP 08/16/17

## 2017-08-16 NOTE — Telephone Encounter (Signed)
Scheduled appt per 5/16 los - pt to get an updated schedule next visit.

## 2017-08-16 NOTE — Patient Instructions (Signed)
[facility] Discharge Instructions for Patients Receiving Chemotherapy  Today you received the following chemotherapy agents imfinzi   To help prevent nausea and vomiting after your treatment, we encourage you to take your nausea medication as directed  If you develop nausea and vomiting that is not controlled by your nausea medication, call the clinic.   BELOW ARE SYMPTOMS THAT SHOULD BE REPORTED IMMEDIATELY:  *FEVER GREATER THAN 100.5 F  *CHILLS WITH OR WITHOUT FEVER  NAUSEA AND VOMITING THAT IS NOT CONTROLLED WITH YOUR NAUSEA MEDICATION  *UNUSUAL SHORTNESS OF BREATH  *UNUSUAL BRUISING OR BLEEDING  TENDERNESS IN MOUTH AND THROAT WITH OR WITHOUT PRESENCE OF ULCERS  *URINARY PROBLEMS  *BOWEL PROBLEMS  UNUSUAL RASH Items with * indicate a potential emergency and should be followed up as soon as possible.  Feel free to call the clinic you have any questions or concerns. The clinic phone number is [phone].

## 2017-08-17 ENCOUNTER — Other Ambulatory Visit: Payer: Self-pay

## 2017-08-17 MED ORDER — FLUTICASONE-UMECLIDIN-VILANT 100-62.5-25 MCG/INH IN AEPB: 1.0000 | INHALATION_SPRAY | Freq: Every day | RESPIRATORY_TRACT | 3 refills | Status: DC

## 2017-08-30 ENCOUNTER — Inpatient Hospital Stay: Payer: Medicare Other

## 2017-08-30 ENCOUNTER — Encounter: Payer: Self-pay | Admitting: Oncology

## 2017-08-30 ENCOUNTER — Telehealth: Payer: Self-pay | Admitting: Internal Medicine

## 2017-08-30 ENCOUNTER — Telehealth: Payer: Self-pay | Admitting: Oncology

## 2017-08-30 ENCOUNTER — Inpatient Hospital Stay (HOSPITAL_BASED_OUTPATIENT_CLINIC_OR_DEPARTMENT_OTHER): Payer: Medicare Other | Admitting: Oncology

## 2017-08-30 ENCOUNTER — Other Ambulatory Visit: Payer: Self-pay | Admitting: Internal Medicine

## 2017-08-30 ENCOUNTER — Other Ambulatory Visit: Payer: Self-pay | Admitting: *Deleted

## 2017-08-30 VITALS — BP 157/79 | HR 75 | Temp 98.6°F | Resp 17 | Ht 72.0 in | Wt 298.7 lb

## 2017-08-30 DIAGNOSIS — R1310 Dysphagia, unspecified: Secondary | ICD-10-CM

## 2017-08-30 DIAGNOSIS — R5382 Chronic fatigue, unspecified: Secondary | ICD-10-CM | POA: Diagnosis not present

## 2017-08-30 DIAGNOSIS — C3411 Malignant neoplasm of upper lobe, right bronchus or lung: Secondary | ICD-10-CM

## 2017-08-30 DIAGNOSIS — I110 Hypertensive heart disease with heart failure: Secondary | ICD-10-CM | POA: Diagnosis not present

## 2017-08-30 DIAGNOSIS — Z5112 Encounter for antineoplastic immunotherapy: Secondary | ICD-10-CM

## 2017-08-30 DIAGNOSIS — J449 Chronic obstructive pulmonary disease, unspecified: Secondary | ICD-10-CM | POA: Diagnosis not present

## 2017-08-30 LAB — COMPREHENSIVE METABOLIC PANEL
ALBUMIN: 3.4 g/dL — AB (ref 3.5–5.0)
ALT: 18 U/L (ref 0–55)
ANION GAP: 9 (ref 3–11)
AST: 20 U/L (ref 5–34)
Alkaline Phosphatase: 111 U/L (ref 40–150)
BUN: 26 mg/dL (ref 7–26)
CHLORIDE: 106 mmol/L (ref 98–109)
CO2: 27 mmol/L (ref 22–29)
Calcium: 9.3 mg/dL (ref 8.4–10.4)
Creatinine, Ser: 1.76 mg/dL — ABNORMAL HIGH (ref 0.70–1.30)
GFR calc non Af Amer: 39 mL/min — ABNORMAL LOW (ref >60)
GFR, EST AFRICAN AMERICAN: 45 mL/min — AB (ref >60)
Glucose, Bld: 127 mg/dL (ref 70–140)
POTASSIUM: 3.9 mmol/L (ref 3.5–5.1)
SODIUM: 142 mmol/L (ref 136–145)
Total Bilirubin: 0.2 mg/dL (ref 0.2–1.2)
Total Protein: 6.3 g/dL — ABNORMAL LOW (ref 6.4–8.3)

## 2017-08-30 LAB — CBC WITH DIFFERENTIAL/PLATELET
BASOS PCT: 1 %
Basophils Absolute: 0 10*3/uL (ref 0.0–0.1)
Eosinophils Absolute: 0.1 10*3/uL (ref 0.0–0.5)
Eosinophils Relative: 2 %
HEMATOCRIT: 35.5 % — AB (ref 38.4–49.9)
HEMOGLOBIN: 11.7 g/dL — AB (ref 13.0–17.1)
LYMPHS PCT: 11 %
Lymphs Abs: 0.4 10*3/uL — ABNORMAL LOW (ref 0.9–3.3)
MCH: 29.8 pg (ref 27.2–33.4)
MCHC: 32.9 g/dL (ref 32.0–36.0)
MCV: 90.4 fL (ref 79.3–98.0)
Monocytes Absolute: 0.5 10*3/uL (ref 0.1–0.9)
Monocytes Relative: 14 %
NEUTROS ABS: 2.8 10*3/uL (ref 1.5–6.5)
NEUTROS PCT: 72 %
Platelets: 132 10*3/uL — ABNORMAL LOW (ref 140–400)
RBC: 3.93 MIL/uL — ABNORMAL LOW (ref 4.20–5.82)
RDW: 14.6 % (ref 11.0–14.6)
WBC: 3.9 10*3/uL — ABNORMAL LOW (ref 4.0–10.3)

## 2017-08-30 MED ORDER — HYDRALAZINE HCL 100 MG PO TABS: 100.0000 mg | ORAL_TABLET | Freq: Three times a day (TID) | ORAL | 0 refills | Status: DC

## 2017-08-30 MED ORDER — SODIUM CHLORIDE 0.9% FLUSH
10.0000 mL | INTRAVENOUS | Status: DC | PRN
  Administered 2017-08-30: 10 mL
  Filled 2017-08-30: qty 10

## 2017-08-30 MED ORDER — SODIUM CHLORIDE 0.9 % IV SOLN
Freq: Once | INTRAVENOUS | Status: AC
  Administered 2017-08-30: 10:00:00 via INTRAVENOUS

## 2017-08-30 MED ORDER — SODIUM CHLORIDE 0.9 % IV SOLN
9.5000 mg/kg | Freq: Once | INTRAVENOUS | Status: AC
  Administered 2017-08-30: 1240 mg via INTRAVENOUS
  Filled 2017-08-30: qty 20

## 2017-08-30 MED ORDER — HEPARIN SOD (PORK) LOCK FLUSH 100 UNIT/ML IV SOLN
500.0000 [IU] | Freq: Once | INTRAVENOUS | Status: AC | PRN
  Administered 2017-08-30: 500 [IU]
  Filled 2017-08-30: qty 5

## 2017-08-30 NOTE — Telephone Encounter (Signed)
appts already scheduled per 5/30 los.

## 2017-08-30 NOTE — Progress Notes (Signed)
Disregard previous progress note regarding Avastin.  Charted in error.

## 2017-08-30 NOTE — Telephone Encounter (Signed)
Pt calling.    *STAT* If patient is at the pharmacy, call can be transferred to refill team.   1. Which medications need to be refilled? (please list name of each medication and dose if known) Hydralazine 100mg   2. Which pharmacy/location (including street and city if local pharmacy) is medication to be sent to?[facility]  3. Do they need a 30 day or 90 day supply? 90 day

## 2017-08-30 NOTE — Progress Notes (Signed)
Okay for treatment for Avastin with labs from 08/16/2017 per Dr. Gorsuch.    

## 2017-08-30 NOTE — Assessment & Plan Note (Signed)
This is a very pleasant 67 year old African-American male with a stage IIIa non-small cell lung cancer, adenocarcinoma.  The patient completed 6 weeks of concurrent chemoradiation with weekly carboplatin and paclitaxel and tolerated his treatment well except for odynophagia. The patient is currently on consolidation treatment with immunotherapy with Imfinzi (Durvalumab) status post 17cycles.  He tolerated the last cycle of his treatment well with no concerning complaints. Labs reviewed and creatinine is 1.76 which is overall stable for this patient.  Okay to proceed with treatment with creatinine of 1.76. I recommended for him to proceed with cycle #18today as a scheduled. He will have a restaging CT scan of the chest in approximately 2 weeks. The patient will come back for follow-up visit in 2 weeks for evaluation before starting cycle #19 and to review his restaging CT scan results.  He was advised to call immediately if he has any concerning symptoms in the interval. The patient voices understanding of current disease status and treatment options and is in agreement with the current care plan. All questions were answered. The patient knows to call the clinic with any problems, questions or concerns. We can certainly see the patient much sooner if necessary.

## 2017-08-30 NOTE — Progress Notes (Signed)
Jason OFFICE PROGRESS NOTE  Jason Lei, MD [address] [place] [address]  DIAGNOSIS: Stage IIIA (T1a, N2, M0) non-small cell lung cancer, adenocarcinoma presented with right upper lobe lung nodule in addition to mediastinal lymphadenopathy diagnosed in March 2018.  Biomarker Findings Tumor Mutational Burden - TMB-Intermediate (8 Muts/Mb) Microsatellite Status - MS-Stable Genomic Findings For a complete list of the genes assayed, please refer to the Appendix. ERBB2 amplification - equivocal? DNMT3A K486f*192 FUBP1 Q40* KEAP1 [id number] TP53 C275F 7 Disease relevant genes with no reportable alterations: EGFR, KRAS, ALK, BRAF, MET, RET, ROS1  PRIOR THERAPY: course of concurrent chemoradiation with weekly carboplatin for AUC of 2 and paclitaxel 45 MG/M2. Status post 6 cycles. Last cycle was given 10/16/2016.  CURRENT THERAPY: Consolidation treatment with immunotherapy with Imfinzi (Durvalumab) 10 MG/M2 every 2 weeks. First dose 12/21/2016. Status post 17cycles.  INTERVAL HISTORY: Jason Kloepfer67.o. male returns for routine follow-up visit by himself.  The patient is feeling fine today and has no specific complaints.  He denies fevers and chills.  Denies chest pain, shortness breath, cough, hemoptysis.  Denies nausea, vomiting, constipation, diarrhea.  Denies recent weight loss or night sweats.  He had difficulty swallowing at his last visit and is currently taking Carafate which helps.  He continues to tolerate treatment with Imfinzi fairly well.  The patient is here for evaluation prior to cycle #18 of his treatment.  MEDICAL HISTORY: Past Medical History:  Diagnosis Date  . Adenocarcinoma of right lung, stage 3 (HCEunice5/23/2018  . Adenocarcinoma of right lung, stage 3 (HCMoro5/23/2018  . Anemia   . Arthritis    "hx right hip"  . Asthma    "when I was a child"  . Atrial fibrillation (HCC)    Amiodarone started 10/2011; Coumadin  . 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 St. Jude ICD implantation 10/03/12.  . Chronic anticoagulation   . Chronic fatigue 10/18/2016  . Chronic fatigue 10/18/2016  . Chronic systolic heart failure (HCAccokeek   a. Echo 7/13: EF 25%;  b. echo 04/2012:  Mild LVH, EF 30-35%, Gr 1 DD, mild AI, mild MR, mild LAE  . COPD (chronic obstructive pulmonary disease) (HCRosalia  . Dyslipidemia   . Dysrhythmia   . Encounter for antineoplastic chemotherapy 08/24/2016  . Gout   . History of blood transfusion 10/15/2013   "don't know where the blood's going; HgB down to 5"  . Hyperlipidemia   . Hypertension   . Hypothyroidism   . NICM (nonischemic cardiomyopathy) ([facility]   [facility]/14:  minimal CAD  . Obesity   . OSA on CPAP   . Tobacco abuse     ALLERGIES:  has No Known Allergies.  MEDICATIONS:  Current Outpatient Medications  Medication Sig Dispense Refill  . acetaminophen (TYLENOL) 500 MG tablet Take 1,000 mg by mouth every 6 (six) hours as needed for mild pain.    . Marland Kitchenmiodarone (PACERONE) 100 MG tablet Take 1 tablet (100 mg total) by mouth daily. Please keep upcoming appt with Dr. RoHarrington Challengern June for future refills. Thank you 90 tablet 0  . atorvastatin (LIPITOR) 20 MG tablet TAKE 1 TABLET BY MOUTH EVERY DAY 90 tablet 2  . carvedilol (COREG) 25 MG tablet TAKE 1 TABLET BY MOUTH TWICE A DAY WITH A MEAL 60 tablet 11  . [employer] D3 2000 units CAPS Take 2,000 Units by mouth daily.   11  . feeding supplement, ENSURE  ENLIVE, (ENSURE ENLIVE) LIQD Take 237 mLs by mouth 2 (two) times daily between meals. 237 mL 12  . ferrous gluconate (FERGON) 324 MG tablet Take 1 tablet (324 mg total) by mouth 3 (three) times daily with meals. 90 tablet 8  . Fluticasone-Umeclidin-Vilant (TRELEGY ELLIPTA) 100-62.5-25 MCG/INH AEPB Inhale 1 puff into the lungs daily. 3 each 3  . furosemide (LASIX) 20 MG tablet TAKE 3 TABLETS (60 MG) EVERY TUE AND SAT, ALL OTHER DAYS TAKE 2 TABLETS (40 MG). 64 tablet 7  . hydrALAZINE  (APRESOLINE) 100 MG tablet Take 100 mg by mouth 3 (three) times daily.   1  . levothyroxine (SYNTHROID, LEVOTHROID) 50 MCG tablet TAKE 1 TABLET (50 MCG TOTAL) BY MOUTH DAILY BEFORE BREAKFAST. 90 tablet 3  . lidocaine-prilocaine (EMLA) cream Apply generous amount to port site at least 1 hr prior to treatment.  DO NOT RUB IN. 30 g 1  . lisinopril (PRINIVIL,ZESTRIL) 40 MG tablet Take 1 tablet (40 mg total) by mouth daily. Please keep 09/2017 appointment for further refills 30 tablet 1  . magnesium oxide (MAG-OX) 400 (241.3 Mg) MG tablet Take 1 tablet (400 mg total) by mouth daily. 30 tablet 0  . potassium chloride SA (KLOR-CON M20) 20 MEQ tablet TAKE 1 TABLET BY MOUTH EVERY DAY *ON DAYS WHEN LASIX IS TAKEN, TAKE 2 TABLETS*  Dr. Harrington Challenger for further refills. 3rd attempt 30 tablet 0  . rivaroxaban (XARELTO) 20 MG TABS tablet Take 1 tablet (20 mg total) by mouth daily. 90 tablet 2  . sildenafil (VIAGRA) 100 MG tablet Take 100 mg by mouth.    . spironolactone (ALDACTONE) 25 MG tablet TAKE 0.5 TABLETS (12.5 MG TOTAL) BY MOUTH DAILY. 45 tablet 0  . sucralfate (CARAFATE) 1 g tablet TAKE 1 TABLET BY MOUTH FOUR TIMES DAILY (5 MINUTES BEFORE MEALS AND AT BEDTIME) FOR ESOPHAGITIS 120 tablet 0  . ULORIC 40 MG tablet Take 1 tablet (40 mg total) by mouth daily. 30 tablet 0  . albuterol (PROVENTIL HFA;VENTOLIN HFA) 108 (90 Base) MCG/ACT inhaler Inhale 2 puffs into the lungs every 4 (four) hours as needed for wheezing or shortness of breath. (Patient not taking: Reported on 08/02/2017) 1 Inhaler 2  . prochlorperazine (COMPAZINE) 10 MG tablet Take 1 tablet (10 mg total) by mouth every 6 (six) hours as needed for nausea or vomiting. (Patient not taking: Reported on 08/02/2017) 30 tablet 0   No current facility-administered medications for this visit.     SURGICAL HISTORY:  Past Surgical History:  Procedure Laterality Date  . CARDIAC DEFIBRILLATOR PLACEMENT  2014  . CARDIOVERSION  2011  . COLONOSCOPY WITH PROPOFOL Left  10/17/2013   Procedure: COLONOSCOPY WITH PROPOFOL;  Surgeon: Inda Castle, MD;  Location: [facility];  Service: Endoscopy;  Laterality: Left;  . ESOPHAGOGASTRODUODENOSCOPY N/A 10/17/2013   Procedure: ESOPHAGOGASTRODUODENOSCOPY (EGD);  Surgeon: Inda Castle, MD;  Location: [facility];  Service: Endoscopy;  Laterality: N/A;  . GIVENS CAPSULE STUDY N/A 10/29/2013   Procedure: GIVENS CAPSULE STUDY;  Surgeon: Inda Castle, MD;  Location: [facility];  Service: Endoscopy;  Laterality: N/A;  . IMPLANTABLE CARDIOVERTER DEFIBRILLATOR IMPLANT Left 10/03/2012   Procedure: IMPLANTABLE CARDIOVERTER DEFIBRILLATOR IMPLANT;  Surgeon: Deboraha Sprang, MD;  [facility] CATH LAB;  Service: Cardiovascular;  Laterality: Left;  . IR FLUORO GUIDE PORT INSERTION RIGHT  09/21/2016  . IR US GUIDE VASC ACCESS RIGHT  09/21/2016  . JOINT REPLACEMENT    . TONSILLECTOMY  1950's  . TOTAL HIP ARTHROPLASTY Right  11/25/1997  . VIDEO BRONCHOSCOPY WITH ENDOBRONCHIAL ULTRASOUND N/A 08/09/2016   Procedure: VIDEO BRONCHOSCOPY WITH ENDOBRONCHIAL ULTRASOUND;  Surgeon: Javier Glazier, MD;  Location: [facility] OR;  Service: Thoracic;  Laterality: N/A;    REVIEW OF SYSTEMS:   Review of Systems  Constitutional: Negative for appetite change, chills, fatigue, fever and unexpected weight change.  HENT:   Negative for mouth sores, nosebleeds, sore throat and trouble swallowing.   Eyes: Negative for eye problems and icterus.  Respiratory: Negative for cough, hemoptysis, shortness of breath and wheezing.   Cardiovascular: Negative for chest pain and leg swelling.  Gastrointestinal: Negative for abdominal pain, constipation, diarrhea, nausea and vomiting.  Genitourinary: Negative for bladder incontinence, difficulty urinating, dysuria, frequency and hematuria.   Musculoskeletal: Negative for back pain, gait problem, neck pain and neck stiffness.  Skin: Negative for itching and rash.  Neurological: Negative for dizziness, extremity  weakness, gait problem, headaches, light-headedness and seizures.  Hematological: Negative for adenopathy. Does not bruise/bleed easily.  Psychiatric/Behavioral: Negative for confusion, depression and sleep disturbance. The patient is not nervous/anxious.     PHYSICAL EXAMINATION:  Blood pressure (!) 157/79, pulse 75, temperature 98.6 F (37 C), temperature source Oral, resp. rate 17, height 6' (1.829 m), weight 298 lb 11.2 oz (135.5 kg), SpO2 99 %.  ECOG PERFORMANCE STATUS: 1 - Symptomatic but completely ambulatory  Physical Exam  Constitutional: Oriented to person, place, and time and well-developed, well-nourished, and in no distress. No distress.  HENT:  Head: Normocephalic and atraumatic.  Mouth/Throat: Oropharynx is clear and moist. No oropharyngeal exudate.  Eyes: Conjunctivae are normal. Right eye exhibits no discharge. Left eye exhibits no discharge. No scleral icterus.  Neck: Normal range of motion. Neck supple.  Cardiovascular: Normal rate, regular rhythm, normal heart sounds and intact distal pulses.   Pulmonary/Chest: Effort normal and breath sounds normal. No respiratory distress. No wheezes. No rales.  Abdominal: Soft. Bowel sounds are normal. Exhibits no distension and no mass. There is no tenderness.  Musculoskeletal: Normal range of motion. Exhibits no edema.  Lymphadenopathy:    No cervical adenopathy.  Neurological: Alert and oriented to person, place, and time. Exhibits normal muscle tone. Gait normal. Coordination normal.  Skin: Skin is warm and dry. No rash noted. Not diaphoretic. No erythema. No pallor.  Psychiatric: Mood, memory and judgment normal.  Vitals reviewed.  LABORATORY DATA: Lab Results  Component Value Date   WBC 3.9 (L) 08/30/2017   HGB 11.7 (L) 08/30/2017   HCT 35.5 (L) 08/30/2017   MCV 90.4 08/30/2017   PLT 132 (L) 08/30/2017      Chemistry      Component Value Date/Time   NA 142 08/30/2017 0844   NA 141 03/29/2017 1038   K 3.9  08/30/2017 0844   K 4.0 03/29/2017 1038   CL 106 08/30/2017 0844   CO2 27 08/30/2017 0844   CO2 27 03/29/2017 1038   BUN 26 08/30/2017 0844   BUN 28.6 (H) 03/29/2017 1038   CREATININE 1.76 (H) 08/30/2017 0844   CREATININE 1.6 (H) 03/29/2017 1038      Component Value Date/Time   CALCIUM 9.3 08/30/2017 0844   CALCIUM 9.3 03/29/2017 1038   ALKPHOS 111 08/30/2017 0844   ALKPHOS 105 03/29/2017 1038   AST 20 08/30/2017 0844   AST 18 03/29/2017 1038   ALT 18 08/30/2017 0844   ALT 13 03/29/2017 1038   BILITOT 0.2 08/30/2017 0844   BILITOT 0.62 03/29/2017 1038       RADIOGRAPHIC STUDIES:  No results found.   ASSESSMENT/PLAN:  Primary cancer of right upper lobe of lung ([place]) This is a very pleasant 67 year old African-American male with a stage IIIa non-small cell lung cancer, adenocarcinoma.  The patient completed 6 weeks of concurrent chemoradiation with weekly carboplatin and paclitaxel and tolerated his treatment well except for odynophagia. The patient is currently on consolidation treatment with immunotherapy with Imfinzi (Durvalumab) status post 17cycles.  He tolerated the last cycle of his treatment well with no concerning complaints. Labs reviewed and creatinine is 1.76 which is overall stable for this patient.  Okay to proceed with treatment with creatinine of 1.76. I recommended for him to proceed with cycle #18today as a scheduled. He will have a restaging CT scan of the chest in approximately 2 weeks. The patient will come back for follow-up visit in 2 weeks for evaluation before starting cycle #19 and to review his restaging CT scan results.  He was advised to call immediately if he has any concerning symptoms in the interval. The patient voices understanding of current disease status and treatment options and is in agreement with the current care plan. All questions were answered. The patient knows to call the clinic with any problems, questions or concerns. We can  certainly see the patient much sooner if necessary.   Orders Placed This Encounter  Procedures  . CT CHEST W CONTRAST    Standing Status:   Future    Standing Expiration Date:   08/31/2018    Order Specific Question:   If indicated for the ordered procedure, I authorize the administration of contrast media per Radiology protocol    Answer:   Yes    Order Specific Question:   Preferred imaging location?    Answer:   [facility]    Order Specific Question:   Radiology Contrast Protocol - do NOT remove file path    Answer:   [email or web address]    Order Specific Question:   Reason for Exam additional comments    Answer:   lung cancer. Restaging.   Mikey Bussing, DNP, AGPCNP-BC, AOCNP 08/30/17

## 2017-08-30 NOTE — Progress Notes (Signed)
Disregard previous note regarding Avastin.  Charted in error.

## 2017-08-30 NOTE — Telephone Encounter (Signed)
Please advise on request as it does not look like Dr Harrington Challenger has been refilling this for the patient. When she filled it previously, it was for the 50 mg tabs. Thanks, MI

## 2017-08-30 NOTE — Patient Instructions (Signed)
[facility] Discharge Instructions for Patients Receiving Chemotherapy  Today you received the following chemotherapy agents: Imfinzi.  To help prevent nausea and vomiting after your treatment, we encourage you to take your nausea medication as directed  If you develop nausea and vomiting that is not controlled by your nausea medication, call the clinic.   BELOW ARE SYMPTOMS THAT SHOULD BE REPORTED IMMEDIATELY:  *FEVER GREATER THAN 100.5 F  *CHILLS WITH OR WITHOUT FEVER  NAUSEA AND VOMITING THAT IS NOT CONTROLLED WITH YOUR NAUSEA MEDICATION  *UNUSUAL SHORTNESS OF BREATH  *UNUSUAL BRUISING OR BLEEDING  TENDERNESS IN MOUTH AND THROAT WITH OR WITHOUT PRESENCE OF ULCERS  *URINARY PROBLEMS  *BOWEL PROBLEMS  UNUSUAL RASH Items with * indicate a potential emergency and should be followed up as soon as possible.  Feel free to call the clinic you have any questions or concerns. The clinic phone number is [phone].

## 2017-08-30 NOTE — Telephone Encounter (Signed)
Ok to refill from Dr. Harrington Challenger for 30 days.  Looks like he has been taking this dose since before 2017. Pt has appt 6/28 with Dr. Harrington Challenger.  This can be reviewed at that time.

## 2017-09-07 ENCOUNTER — Ambulatory Visit (HOSPITAL_COMMUNITY)
Admission: RE | Admit: 2017-09-07 | Discharge: 2017-09-07 | Disposition: A | Discharge status: Home / Self Care | Payer: Medicare Other | Source: Ambulatory Visit | Attending: Oncology | Admitting: Oncology

## 2017-09-07 DIAGNOSIS — C3490 Malignant neoplasm of unspecified part of unspecified bronchus or lung: Secondary | ICD-10-CM | POA: Diagnosis not present

## 2017-09-07 DIAGNOSIS — I7789 Other specified disorders of arteries and arterioles: Secondary | ICD-10-CM | POA: Diagnosis not present

## 2017-09-07 DIAGNOSIS — I700 Atherosclerosis of aorta: Secondary | ICD-10-CM | POA: Diagnosis not present

## 2017-09-07 DIAGNOSIS — J701 Chronic and other pulmonary manifestations due to radiation: Secondary | ICD-10-CM | POA: Insufficient documentation

## 2017-09-07 DIAGNOSIS — N200 Calculus of kidney: Secondary | ICD-10-CM | POA: Diagnosis not present

## 2017-09-07 DIAGNOSIS — C3411 Malignant neoplasm of upper lobe, right bronchus or lung: Secondary | ICD-10-CM | POA: Insufficient documentation

## 2017-09-07 DIAGNOSIS — J432 Centrilobular emphysema: Secondary | ICD-10-CM | POA: Diagnosis not present

## 2017-09-07 DIAGNOSIS — Y842 Radiological procedure and radiotherapy as the cause of abnormal reaction of the patient, or of later complication, without mention of misadventure at the time of the procedure: Secondary | ICD-10-CM | POA: Insufficient documentation

## 2017-09-07 MED ORDER — IOHEXOL 300 MG/ML  SOLN
75.0000 mL | Freq: Once | INTRAMUSCULAR | Status: AC | PRN
  Administered 2017-09-07: 60 mL via INTRAVENOUS

## 2017-09-09 ENCOUNTER — Other Ambulatory Visit: Payer: Self-pay | Admitting: Internal Medicine

## 2017-09-10 ENCOUNTER — Ambulatory Visit (INDEPENDENT_AMBULATORY_CARE_PROVIDER_SITE_OTHER): Payer: Medicare Other | Admitting: *Deleted

## 2017-09-10 DIAGNOSIS — I5022 Chronic systolic (congestive) heart failure: Secondary | ICD-10-CM

## 2017-09-10 DIAGNOSIS — I428 Other cardiomyopathies: Secondary | ICD-10-CM

## 2017-09-10 DIAGNOSIS — Z95810 Presence of automatic (implantable) cardiac defibrillator: Secondary | ICD-10-CM

## 2017-09-10 NOTE — Progress Notes (Signed)
Remote ICD transmission.   

## 2017-09-11 ENCOUNTER — Encounter: Payer: Self-pay | Admitting: Cardiology

## 2017-09-11 NOTE — Progress Notes (Signed)
EPIC Encounter for ICM Monitoring  Patient Name: Jason Russell is a 67 y.o. male Date: 09/11/2017 Primary Care Physican: Lucianne Lei, MD Primary Cardiologist:Ross Electrophysiologist: Faustino Congress Weight: Approximately 285 lbs      Heart Failure questions reviewed, pt asymptomatic.  He is feeling great. He did gain a couple of pounds during decreased impedance.    Thoracic impedance normal but was abnormal suggesting fluid accumulation from 08/26/2017 - 09/01/2017 and 09/04/2017 to 09/08/2017.  Prescribed dosage: Furosemide 20 mg 3 tablets (60 mg total) every Tuesday and Saturday and 40 mg all the other days. Potassium 20 mEq 1 tablet every day and on days when lasix is taken, take 2 tablets  Labs: 08/30/2017 Creatinine 1.76, BUN 26, Potassium 3.9, Sodium 142, EGFR 39-45 08/16/2017 Creatinine 1.52, BUN 23, Potassium 4.0, Sodium 142, EGFR 46-54 08/02/2017 Creatinine 1.52, BUN 28, Potassium 3.7, Sodium 143, EGFR 46-54 07/19/2017 Creatinine 1.49, BUN 24, Potassium 4.0, Sodium 143, EGFR 48-55 07/05/2017 Creatinine1.65, BUN25, Potassium3.7, [id number], [id number] 06/21/2017 Creatinine1.60, BUN27, Potassium4.0, Sodium141, BUYZ70-96  06/07/2017 Creatinine1.33, BUN29, Potassium4.0, [id number], EGFR55->60  05/24/2017 Creatinine1.60, BUN25, Potassium4.2, Sodium142, RFVO36-06  05/10/2017 Creatinine1.54, BUN26, Potassium3.4, VPCHEK352, YELY59-09 04/26/2017 Creatinine1.52, BUN29, Potassium3.9, PJPETK244, CXFQ72-25 01/10/2019Creatinine 1.72, BUN37, Potassium3.6, [id number], OIPP89-84 A complete set of results can be found in results review.  Recommendations: No changes.  Advised to call to schedule yearly appt with Dr Caryl Comes.   Encouraged to call for fluid symptoms.  Follow-up plan: ICM clinic phone appointment on 10/11/2017.  Office appointment with Dr Harrington Challenger on 09/28/2017.  Recall appt 10/18/2017 with Dr. Caryl Comes.  Copy of ICM check sent to Dr. Caryl Comes.   3 month ICM  trend: 09/10/2017    1 Year ICM trend:       [name], RN 09/11/2017 12:24 PM

## 2017-09-13 ENCOUNTER — Inpatient Hospital Stay: Payer: Medicare Other

## 2017-09-13 ENCOUNTER — Inpatient Hospital Stay: Payer: Medicare Other | Attending: Internal Medicine

## 2017-09-13 ENCOUNTER — Encounter: Payer: Self-pay | Admitting: Internal Medicine

## 2017-09-13 ENCOUNTER — Other Ambulatory Visit: Payer: Self-pay | Admitting: Internal Medicine

## 2017-09-13 ENCOUNTER — Telehealth: Payer: Self-pay | Admitting: Internal Medicine

## 2017-09-13 ENCOUNTER — Inpatient Hospital Stay (HOSPITAL_BASED_OUTPATIENT_CLINIC_OR_DEPARTMENT_OTHER): Payer: Medicare Other | Admitting: Internal Medicine

## 2017-09-13 VITALS — BP 151/73 | HR 70 | Temp 97.8°F | Resp 17 | Ht 72.0 in | Wt 297.0 lb

## 2017-09-13 DIAGNOSIS — Z9221 Personal history of antineoplastic chemotherapy: Secondary | ICD-10-CM | POA: Insufficient documentation

## 2017-09-13 DIAGNOSIS — I10 Essential (primary) hypertension: Secondary | ICD-10-CM

## 2017-09-13 DIAGNOSIS — J430 Unilateral pulmonary emphysema [MacLeod's syndrome]: Secondary | ICD-10-CM

## 2017-09-13 DIAGNOSIS — Z720 Tobacco use: Secondary | ICD-10-CM | POA: Insufficient documentation

## 2017-09-13 DIAGNOSIS — C3411 Malignant neoplasm of upper lobe, right bronchus or lung: Secondary | ICD-10-CM | POA: Insufficient documentation

## 2017-09-13 DIAGNOSIS — Z5112 Encounter for antineoplastic immunotherapy: Secondary | ICD-10-CM

## 2017-09-13 DIAGNOSIS — R000 Tachycardia, unspecified: Secondary | ICD-10-CM | POA: Diagnosis not present

## 2017-09-13 LAB — COMPREHENSIVE METABOLIC PANEL
ALK PHOS: 117 U/L (ref 40–150)
ALT: 12 U/L (ref 0–55)
AST: 16 U/L (ref 5–34)
Albumin: 3.6 g/dL (ref 3.5–5.0)
Anion gap: 8 (ref 3–11)
BILIRUBIN TOTAL: 0.4 mg/dL (ref 0.2–1.2)
BUN: 32 mg/dL — AB (ref 7–26)
CHLORIDE: 105 mmol/L (ref 98–109)
CO2: 27 mmol/L (ref 22–29)
CREATININE: 1.58 mg/dL — AB (ref 0.70–1.30)
Calcium: 9.4 mg/dL (ref 8.4–10.4)
GFR calc Af Amer: 51 mL/min — ABNORMAL LOW (ref >60)
GFR calc non Af Amer: 44 mL/min — ABNORMAL LOW (ref >60)
Glucose, Bld: 105 mg/dL (ref 70–140)
Potassium: 3.9 mmol/L (ref 3.5–5.1)
Sodium: 140 mmol/L (ref 136–145)
TOTAL PROTEIN: 6.6 g/dL (ref 6.4–8.3)

## 2017-09-13 LAB — CBC WITH DIFFERENTIAL/PLATELET
Basophils Absolute: 0 10*3/uL (ref 0.0–0.1)
Basophils Relative: 0 %
EOS ABS: 0.1 10*3/uL (ref 0.0–0.5)
EOS PCT: 2 %
HCT: 36.9 % — ABNORMAL LOW (ref 38.4–49.9)
Hemoglobin: 12 g/dL — ABNORMAL LOW (ref 13.0–17.1)
LYMPHS ABS: 0.8 10*3/uL — AB (ref 0.9–3.3)
Lymphocytes Relative: 17 %
MCH: 29.7 pg (ref 27.2–33.4)
MCHC: 32.5 g/dL (ref 32.0–36.0)
MCV: 91.3 fL (ref 79.3–98.0)
Monocytes Absolute: 0.5 10*3/uL (ref 0.1–0.9)
Monocytes Relative: 11 %
Neutro Abs: 3.2 10*3/uL (ref 1.5–6.5)
Neutrophils Relative %: 70 %
PLATELETS: 140 10*3/uL (ref 140–400)
RBC: 4.04 MIL/uL — AB (ref 4.20–5.82)
RDW: 14.2 % (ref 11.0–14.6)
WBC: 4.6 10*3/uL (ref 4.0–10.3)

## 2017-09-13 LAB — TSH: TSH: 3.528 u[IU]/mL (ref 0.320–4.118)

## 2017-09-13 MED ORDER — SODIUM CHLORIDE 0.9 % IV SOLN: 10.0000 mg/kg | Freq: Once | INTRAVENOUS | Status: DC

## 2017-09-13 MED ORDER — SODIUM CHLORIDE 0.9 % IV SOLN
10.4000 mg/kg | Freq: Once | INTRAVENOUS | Status: AC
  Administered 2017-09-13: 1360 mg via INTRAVENOUS
  Filled 2017-09-13: qty 7.2

## 2017-09-13 MED ORDER — HEPARIN SOD (PORK) LOCK FLUSH 100 UNIT/ML IV SOLN
500.0000 [IU] | Freq: Once | INTRAVENOUS | Status: AC | PRN
  Administered 2017-09-13: 500 [IU]
  Filled 2017-09-13: qty 5

## 2017-09-13 MED ORDER — SODIUM CHLORIDE 0.9 % IV SOLN
Freq: Once | INTRAVENOUS | Status: AC
  Administered 2017-09-13: 10:00:00 via INTRAVENOUS

## 2017-09-13 MED ORDER — SODIUM CHLORIDE 0.9% FLUSH
10.0000 mL | INTRAVENOUS | Status: DC | PRN
  Administered 2017-09-13: 10 mL
  Filled 2017-09-13: qty 10

## 2017-09-13 NOTE — Patient Instructions (Signed)
[place] [facility] Discharge Instructions for Patients Receiving Chemotherapy  Today you received the following chemotherapy agents: durvalumab (Imfinzi).  To help prevent nausea and vomiting after your treatment, we encourage you to take your nausea medication as directed  If you develop nausea and vomiting that is not controlled by your nausea medication, call the clinic.   BELOW ARE SYMPTOMS THAT SHOULD BE REPORTED IMMEDIATELY:  *FEVER GREATER THAN 100.5 F  *CHILLS WITH OR WITHOUT FEVER  NAUSEA AND VOMITING THAT IS NOT CONTROLLED WITH YOUR NAUSEA MEDICATION  *UNUSUAL SHORTNESS OF BREATH  *UNUSUAL BRUISING OR BLEEDING  TENDERNESS IN MOUTH AND THROAT WITH OR WITHOUT PRESENCE OF ULCERS  *URINARY PROBLEMS  *BOWEL PROBLEMS  UNUSUAL RASH Items with * indicate a potential emergency and should be followed up as soon as possible.  Feel free to call the clinic you have any questions or concerns. The clinic phone number is [phone].

## 2017-09-13 NOTE — Progress Notes (Signed)
Dr. Julien Nordmann ok to tx with ctn 1.58.

## 2017-09-13 NOTE — Telephone Encounter (Signed)
Scheduled appt per 6/13 los - pt to get an updated schedule in the treatment area.

## 2017-09-13 NOTE — Progress Notes (Signed)
Jason Russell [phone]   [phone]  OFFICE PROGRESS NOTE  Lucianne Lei, MD [address] [place] [address]  DIAGNOSIS: Stage IIIA (T1a, N2, M0) non-small cell lung cancer, adenocarcinoma presented with right upper lobe lung nodule in addition to mediastinal lymphadenopathy diagnosed in March 2018.  Biomarker Findings Tumor Mutational Burden - TMB-Intermediate (8 Muts/Mb) Microsatellite Status - MS-Stable Genomic Findings For a complete list of the genes assayed, please refer to the Appendix. ERBB2 amplification - equivocal? DNMT3A K427f*192 FUBP1 Q40* KEAP1 G332f68 TP53 C275F 7 Disease relevant genes with no reportable alterations: EGFR, KRAS, ALK, BRAF, MET, RET, ROS1   PRIOR THERAPY: course of concurrent chemoradiation with weekly carboplatin for AUC of 2 and paclitaxel 45 MG/M2. Status post 6 cycles. Last cycle was given 10/16/2016.  CURRENT THERAPY:  Consolidation treatment with immunotherapy with Imfinzi (Durvalumab) 10 MG/M2 every 2 weeks. First dose 12/21/2016.  Status post 18 cycles.  INTERVAL HISTORY: Jason Dulay89.o. male returns to the clinic today for follow-up visit.  The patient is feeling fine today with no specific complaints.  He continues to tolerate his treatment with Imfinzi (Durvalumab) fairly well.  He denied having any chest pain, shortness breath, cough or hemoptysis.  He has no nausea, vomiting, diarrhea or constipation.  He denied having any skin rash.  The patient denied having any significant weight loss.  He had repeat CT scan of the chest performed recently and he is here for evaluation and discussion of his discuss results.  MEDICAL HISTORY: Past Medical History:  Diagnosis Date  . Adenocarcinoma of right lung, stage 3 ([facility]5/23/2018  . Adenocarcinoma of right lung, stage 3 (HC[facility]io5/23/2018  . Anemia   . Arthritis    "hx right hip"  . Asthma    "when I was a child"  . Atrial  fibrillation (HCC)    Amiodarone started 10/2011; Coumadin  . 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 St. Jude ICD implantation 10/03/12.  . Chronic anticoagulation   . Chronic fatigue 10/18/2016  . Chronic fatigue 10/18/2016  . Chronic systolic heart failure (HCPortia   a. Echo 7/13: EF 25%;  b. echo 04/2012:  Mild LVH, EF 30-35%, Gr 1 DD, mild AI, mild MR, mild LAE  . COPD (chronic obstructive pulmonary disease) ([facility]  . Dyslipidemia   . Dysrhythmia   . Encounter for antineoplastic chemotherapy 08/24/2016  . Gout   . History of blood transfusion 10/15/2013   "don't know where the blood's going; HgB down to 5"  . Hyperlipidemia   . Hypertension   . Hypothyroidism   . NICM (nonischemic cardiomyopathy) ([facility] [place]   [facility]/14:  minimal CAD  . Obesity   . OSA on CPAP   . Tobacco abuse     ALLERGIES:  has No Known Allergies.  MEDICATIONS:  Current Outpatient Medications  Medication Sig Dispense Refill  . acetaminophen (TYLENOL) 500 MG tablet Take 1,000 mg by mouth every 6 (six) hours as needed for mild pain.    . Marland Kitchenlbuterol (PROVENTIL HFA;VENTOLIN HFA) 108 (90 Base) MCG/ACT inhaler Inhale 2 puffs into the lungs every 4 (four) hours as needed for wheezing or shortness of breath. (Patient not taking: Reported on 08/02/2017) 1 Inhaler 2  . amiodarone (PACERONE) 100 MG tablet Take 1 tablet (100 mg total) by mouth daily. Please keep upcoming appt with Dr. RoHarrington Challengern June for future refills. Thank you 90 tablet 0  .  atorvastatin (LIPITOR) 20 MG tablet TAKE 1 TABLET BY MOUTH EVERY DAY 90 tablet 2  . carvedilol (COREG) 12.5 MG tablet TAKE 1 TABLET (12.5 MG TOTAL) BY MOUTH 2 (TWO) TIMES DAILY. 180 tablet 1  . carvedilol (COREG) 25 MG tablet TAKE 1 TABLET BY MOUTH TWICE A DAY WITH A MEAL 60 tablet 11  . [facility] D3 2000 units CAPS Take 2,000 Units by mouth daily.   11  . feeding supplement, ENSURE ENLIVE, (ENSURE ENLIVE) LIQD Take 237 mLs by mouth 2 (two) times daily between meals. 237  mL 12  . ferrous gluconate (FERGON) 324 MG tablet Take 1 tablet (324 mg total) by mouth 3 (three) times daily with meals. 90 tablet 8  . Fluticasone-Umeclidin-Vilant (TRELEGY ELLIPTA) 100-62.5-25 MCG/INH AEPB Inhale 1 puff into the lungs daily. 3 each 3  . furosemide (LASIX) 20 MG tablet TAKE 3 TABLETS (60 MG) EVERY TUE AND SAT, ALL OTHER DAYS TAKE 2 TABLETS (40 MG). 64 tablet 7  . hydrALAZINE (APRESOLINE) 100 MG tablet Take 1 tablet (100 mg total) by mouth 3 (three) times daily. Please keep upcoming appointment for further refills 90 tablet 0  . levothyroxine (SYNTHROID, LEVOTHROID) 50 MCG tablet TAKE 1 TABLET (50 MCG TOTAL) BY MOUTH DAILY BEFORE BREAKFAST. 90 tablet 3  . lidocaine-prilocaine (EMLA) cream Apply generous amount to port site at least 1 hr prior to treatment.  DO NOT RUB IN. 30 g 1  . lisinopril (PRINIVIL,ZESTRIL) 40 MG tablet Take 1 tablet (40 mg total) by mouth daily. Please keep 09/2017 appointment for further refills 30 tablet 1  . magnesium oxide (MAG-OX) 400 (241.3 Mg) MG tablet Take 1 tablet (400 mg total) by mouth daily. 30 tablet 0  . potassium chloride SA (KLOR-CON M20) 20 MEQ tablet TAKE 1 TABLET BY MOUTH EVERY DAY *ON DAYS WHEN LASIX IS TAKEN, TAKE 2 TABLETS*  Dr. Harrington Challenger for further refills. 3rd attempt 30 tablet 0  . prochlorperazine (COMPAZINE) 10 MG tablet Take 1 tablet (10 mg total) by mouth every 6 (six) hours as needed for nausea or vomiting. (Patient not taking: Reported on 08/02/2017) 30 tablet 0  . rivaroxaban (XARELTO) 20 MG TABS tablet Take 1 tablet (20 mg total) by mouth daily. 90 tablet 2  . sildenafil (VIAGRA) 100 MG tablet Take 100 mg by mouth.    . spironolactone (ALDACTONE) 25 MG tablet Take 0.5 tablets (12.5 mg total) by mouth daily. Please keep upcoming appt with Dr. Harrington Challenger in June for future refills. Thank you 45 tablet 0  . sucralfate (CARAFATE) 1 g tablet TAKE 1 TABLET BY MOUTH FOUR TIMES DAILY (5 MINUTES BEFORE MEALS AND AT BEDTIME) FOR ESOPHAGITIS 120 tablet  0  . ULORIC 40 MG tablet Take 1 tablet (40 mg total) by mouth daily. 30 tablet 0   No current facility-administered medications for this visit.     SURGICAL HISTORY:  Past Surgical History:  Procedure Laterality Date  . CARDIAC DEFIBRILLATOR PLACEMENT  2014  . CARDIOVERSION  2011  . COLONOSCOPY WITH PROPOFOL Left 10/17/2013   Procedure: COLONOSCOPY WITH PROPOFOL;  Surgeon: Inda Castle, MD;  Location: [place];  Service: Endoscopy;  Laterality: Left;  . ESOPHAGOGASTRODUODENOSCOPY N/A 10/17/2013   Procedure: ESOPHAGOGASTRODUODENOSCOPY (EGD);  Surgeon: Inda Castle, MD;  Location: [facility];  Service: Endoscopy;  Laterality: N/A;  . GIVENS CAPSULE STUDY N/A 10/29/2013   Procedure: GIVENS CAPSULE STUDY;  Surgeon: Inda Castle, MD;  Location: [facility];  Service: Endoscopy;  Laterality: N/A;  . IMPLANTABLE CARDIOVERTER  DEFIBRILLATOR IMPLANT Left 10/03/2012   Procedure: IMPLANTABLE CARDIOVERTER DEFIBRILLATOR IMPLANT;  Surgeon: Deboraha Sprang, MD;  Location[facility] C[facility] LAB;  Service: Cardiovascular;  Laterality: Left;  . IR FLUORO GUIDE PORT INSERTION RIGHT  09/21/2016  . IR US GUIDE VASC ACCESS RIGHT  09/21/2016  . JOINT REPLACEMENT    . TONSILLECTOMY  1950's  . TOTAL HIP ARTHROPLASTY Right 11/25/1997  . VIDEO BRONCHOSCOPY WITH ENDOBRONCHIAL ULTRASOUND N/A 08/09/2016   Procedure: VIDEO BRONCHOSCOPY WITH ENDOBRONCHIAL ULTRASOUND;  Surgeon: Javier Glazier, MD;  Location: [facility] OR;  Service: Thoracic;  Laterality: N/A;    REVIEW OF SYSTEMS:  Constitutional: negative Eyes: negative Ears, nose, mouth, throat, and face: negative Respiratory: negative Cardiovascular: negative Gastrointestinal: negative Genitourinary:negative Integument/breast: negative Hematologic/lymphatic: negative Musculoskeletal:negative Neurological: negative Behavioral/Psych: negative Endocrine: negative Allergic/Immunologic: negative   PHYSICAL EXAMINATION: General appearance: alert, cooperative and no  distress Head: Normocephalic, without obvious abnormality, atraumatic Neck: no adenopathy, no JVD, supple, symmetrical, trachea midline and thyroid not enlarged, symmetric, no tenderness/mass/nodules Lymph nodes: Cervical, supraclavicular, and axillary nodes normal. Resp: clear to auscultation bilaterally Back: symmetric, no curvature. ROM normal. No CVA tenderness. Cardio: regular rate and rhythm, S1, S2 normal, no murmur, click, rub or gallop GI: soft, non-tender; bowel sounds normal; no masses,  no organomegaly Extremities: extremities normal, atraumatic, no cyanosis or edema Neurologic: Alert and oriented X 3, normal strength and tone. Normal symmetric reflexes. Normal coordination and gait  ECOG PERFORMANCE STATUS: 1 - Symptomatic but completely ambulatory  Blood pressure (!) 151/73, pulse 70, temperature 97.8 F (36.6 C), temperature source Oral, resp. rate 17, height 6' (1.829 m), weight 297 lb (134.7 kg), SpO2 99 %.  LABORATORY DATA: Lab Results  Component Value Date   WBC 4.6 09/13/2017   HGB 12.0 (L) 09/13/2017   HCT 36.9 (L) 09/13/2017   MCV 91.3 09/13/2017   PLT 140 09/13/2017      Chemistry      Component Value Date/Time   NA 142 08/30/2017 0844   NA 141 03/29/2017 1038   K 3.9 08/30/2017 0844   K 4.0 03/29/2017 1038   CL 106 08/30/2017 0844   CO2 27 08/30/2017 0844   CO2 27 03/29/2017 1038   BUN 26 08/30/2017 0844   BUN 28.6 (H) 03/29/2017 1038   CREATININE 1.76 (H) 08/30/2017 0844   CREATININE 1.6 (H) 03/29/2017 1038      Component Value Date/Time   CALCIUM 9.3 08/30/2017 0844   CALCIUM 9.3 03/29/2017 1038   ALKPHOS 111 08/30/2017 0844   ALKPHOS 105 03/29/2017 1038   AST 20 08/30/2017 0844   AST 18 03/29/2017 1038   ALT 18 08/30/2017 0844   ALT 13 03/29/2017 1038   BILITOT 0.2 08/30/2017 0844   BILITOT 0.62 03/29/2017 1038       RADIOGRAPHIC STUDIES: Ct Chest W Contrast  Result Date: 09/07/2017 CLINICAL DATA:  Stage IIIA non-small cell lung  cancer, adenocarcinoma. Status post chemoradiation. Currently on immunotherapy. EXAM: CT CHEST WITH CONTRAST TECHNIQUE: Multidetector CT imaging of the chest was performed during intravenous contrast administration. CONTRAST:  27m OMNIPAQUE IOHEXOL 300 MG/ML  SOLN COMPARISON:  06/19/2017 FINDINGS: Cardiovascular: Pacer/AICD device. Aortic and branch vessel atherosclerosis. Mild cardiomegaly, without pericardial effusion. No central pulmonary embolism, on this non-dedicated study. Pulmonary artery enlargement, outflow tract 3.2 cm. Mediastinum/Nodes: No supraclavicular adenopathy. No well-defined mediastinal adenopathy. Right-sided mediastinal edema is unchanged. No hilar adenopathy. Fluid level in the esophagus, including image 39/2 Lungs/Pleura: Slight increase in minimal right pleural fluid. Mild left pleural thickening. Moderate centrilobular  emphysema. Similar appearance and configuration of paramediastinal primarily right upper lobe and less so superior segment right lower lobe radiation fibrosis. A right apical 4 mm nodule on image 18/7 is similar on prior exams including back to 07/11/2016, favoring a benign etiology. Clear left lung. Upper Abdomen: Normal imaged portions of the liver, spleen, stomach, pancreas, gallbladder, adrenal glands,. Multiple right renal calculi at up to 7 mm. Low-density left renal lesion is likely a cyst. Musculoskeletal: Moderate bilateral gynecomastia. Moderate thoracic spondylosis. IMPRESSION: 1. Similar appearance of right paramediastinal radiation fibrosis. No evidence of recurrent or metastatic disease. 2. Right upper lobe pulmonary nodule of 4 mm is similar back to 07/11/2016, favored to be benign. 3. Aortic atherosclerosis (ICD10-I70.0) and emphysema (ICD10-J43.9). 4. Pulmonary artery enlargement suggests pulmonary arterial hypertension. 5. Right nephrolithiasis. 6. Esophageal air fluid level suggests dysmotility or gastroesophageal reflux. Electronically Signed   By: Abigail Miyamoto M.D.   On: 09/07/2017 11:48    ASSESSMENT AND PLAN:  This is a very pleasant 67 years old African-American male with a stage IIIa non-small cell lung cancer, adenocarcinoma.  The patient completed 6 weeks of concurrent chemoradiation with weekly carboplatin and paclitaxel and tolerated his treatment well except for odynophagia. The patient is currently on consolidation treatment with immunotherapy with Imfinzi (Durvalumab) status post 18 cycles.  He continues to tolerate this treatment well with no concerning complaints. The patient had a repeat CT scan of the chest performed recently.  I personally and independently reviewed the scans and discussed the results with the patient today.  His a scan showed no concerning findings for disease recurrence or metastasis. I recommended for the patient to continue his current treatment with Imfinzi (Durvalumab) and he will proceed with cycle #19 today. I will see him back for follow-up visit in 2 weeks for evaluation before starting cycle #20. For hypertension the patient was advised to take his blood pressure medication as prescribed and to discuss with his primary care physician for adjustment if needed. He was advised to call immediately if he has any concerning symptoms in the interval. The patient voices understanding of current disease status and treatment options and is in agreement with the current care plan. All questions were answered. The patient knows to call the clinic with any problems, questions or concerns. We can certainly see the patient much sooner if necessary.  Disclaimer: This note was dictated with voice recognition software. Similar sounding words can inadvertently be transcribed and may not be corrected upon review.

## 2017-09-18 ENCOUNTER — Other Ambulatory Visit: Payer: Self-pay | Admitting: Adult Health

## 2017-09-18 MED ORDER — ALBUTEROL SULFATE HFA 108 (90 BASE) MCG/ACT IN AERS: 2.0000 | INHALATION_SPRAY | RESPIRATORY_TRACT | 2 refills | Status: DC | PRN

## 2017-09-18 NOTE — Telephone Encounter (Signed)
Received faxed refill request from [facility] [address] for [facility] HFA 2puffs q4h prn Last office visit 2.25.19 with TP, recs to follow up in 6 months with RB >> recall in place Refills sent to pharmacy to last until upcoming appt

## 2017-09-20 ENCOUNTER — Other Ambulatory Visit: Payer: Self-pay | Admitting: Internal Medicine

## 2017-09-21 ENCOUNTER — Other Ambulatory Visit: Payer: Self-pay | Admitting: Internal Medicine

## 2017-09-21 NOTE — Telephone Encounter (Signed)
Outpatient Medication Detail    Disp Refills Start End   hydrALAZINE (APRESOLINE) 100 MG tablet 90 tablet 0 08/30/2017    Sig - Route: Take 1 tablet (100 mg total) by mouth 3 (three) times daily. Please keep upcoming appointment for further refills - Oral   Sent to pharmacy as: hydrALAZINE (APRESOLINE) 100 MG tablet   E-Prescribing Status: Receipt confirmed by pharmacy (08/30/2017 10:39 AM EDT)   Pharmacy   [id number] - [place], [place]

## 2017-09-25 LAB — CUP PACEART REMOTE DEVICE CHECK
Battery Remaining Longevity: 56 mo
Battery Remaining Percentage: 53 %
Battery Voltage: 2.98 V
Brady Statistic RV Percent Paced: 1 %
Date Time Interrogation Session: 20190610060022
HIGH POWER IMPEDANCE MEASURED VALUE: 35 Ohm
HighPow Impedance: 35 Ohm
Implantable Lead Implant Date: 20140703
Implantable Pulse Generator Implant Date: 20140703
Lead Channel Impedance Value: 380 Ohm
Lead Channel Pacing Threshold Amplitude: 0.5 V
Lead Channel Pacing Threshold Pulse Width: 0.5 ms
Lead Channel Sensing Intrinsic Amplitude: 11.7 mV
MDC IDC LEAD LOCATION: 753860
MDC IDC PG SERIAL: 1105031
MDC IDC SET LEADCHNL RV PACING AMPLITUDE: 2.5 V
MDC IDC SET LEADCHNL RV PACING PULSEWIDTH: 0.5 ms
MDC IDC SET LEADCHNL RV SENSING SENSITIVITY: 0.5 mV

## 2017-09-27 ENCOUNTER — Encounter: Payer: Self-pay | Admitting: Internal Medicine

## 2017-09-27 ENCOUNTER — Inpatient Hospital Stay: Payer: Medicare Other

## 2017-09-27 ENCOUNTER — Inpatient Hospital Stay (HOSPITAL_BASED_OUTPATIENT_CLINIC_OR_DEPARTMENT_OTHER): Payer: Medicare Other | Admitting: Internal Medicine

## 2017-09-27 ENCOUNTER — Telehealth: Payer: Self-pay | Admitting: Internal Medicine

## 2017-09-27 VITALS — BP 112/71 | HR 121 | Temp 97.9°F | Resp 18 | Ht 73.0 in | Wt 294.5 lb

## 2017-09-27 DIAGNOSIS — C3411 Malignant neoplasm of upper lobe, right bronchus or lung: Secondary | ICD-10-CM

## 2017-09-27 DIAGNOSIS — Z5112 Encounter for antineoplastic immunotherapy: Secondary | ICD-10-CM

## 2017-09-27 DIAGNOSIS — I10 Essential (primary) hypertension: Secondary | ICD-10-CM | POA: Diagnosis not present

## 2017-09-27 DIAGNOSIS — R000 Tachycardia, unspecified: Secondary | ICD-10-CM

## 2017-09-27 DIAGNOSIS — Z9221 Personal history of antineoplastic chemotherapy: Secondary | ICD-10-CM

## 2017-09-27 DIAGNOSIS — Z720 Tobacco use: Secondary | ICD-10-CM | POA: Diagnosis not present

## 2017-09-27 LAB — COMPREHENSIVE METABOLIC PANEL
ALT: 17 U/L (ref 0–44)
AST: 18 U/L (ref 15–41)
Albumin: 3.7 g/dL (ref 3.5–5.0)
Alkaline Phosphatase: 103 U/L (ref 38–126)
Anion gap: 8 (ref 5–15)
BILIRUBIN TOTAL: 0.5 mg/dL (ref 0.3–1.2)
BUN: 27 mg/dL — AB (ref 8–23)
CO2: 29 mmol/L (ref 22–32)
CREATININE: 1.65 mg/dL — AB (ref 0.61–1.24)
Calcium: 9.3 mg/dL (ref 8.9–10.3)
Chloride: 107 mmol/L (ref 98–111)
GFR calc Af Amer: 49 mL/min — ABNORMAL LOW (ref >60)
GFR, EST NON AFRICAN AMERICAN: 42 mL/min — AB (ref >60)
Glucose, Bld: 101 mg/dL — ABNORMAL HIGH (ref 70–99)
Potassium: 3.6 mmol/L (ref 3.5–5.1)
Sodium: 144 mmol/L (ref 135–145)
TOTAL PROTEIN: 6.6 g/dL (ref 6.5–8.1)

## 2017-09-27 LAB — CBC WITH DIFFERENTIAL/PLATELET
BASOS ABS: 0 10*3/uL (ref 0.0–0.1)
BASOS PCT: 1 %
EOS ABS: 0.1 10*3/uL (ref 0.0–0.5)
EOS PCT: 2 %
HCT: 36.3 % — ABNORMAL LOW (ref 38.4–49.9)
Hemoglobin: 11.9 g/dL — ABNORMAL LOW (ref 13.0–17.1)
LYMPHS PCT: 15 %
Lymphs Abs: 0.6 10*3/uL — ABNORMAL LOW (ref 0.9–3.3)
MCH: 29.6 pg (ref 27.2–33.4)
MCHC: 32.8 g/dL (ref 32.0–36.0)
MCV: 90.2 fL (ref 79.3–98.0)
MONO ABS: 0.7 10*3/uL (ref 0.1–0.9)
Monocytes Relative: 19 %
Neutro Abs: 2.5 10*3/uL (ref 1.5–6.5)
Neutrophils Relative %: 63 %
PLATELETS: 139 10*3/uL — AB (ref 140–400)
RBC: 4.02 MIL/uL — ABNORMAL LOW (ref 4.20–5.82)
RDW: 15.1 % — AB (ref 11.0–14.6)
WBC: 4 10*3/uL (ref 4.0–10.3)

## 2017-09-27 MED ORDER — HEPARIN SOD (PORK) LOCK FLUSH 100 UNIT/ML IV SOLN
500.0000 [IU] | Freq: Once | INTRAVENOUS | Status: AC | PRN
  Administered 2017-09-27: 500 [IU]
  Filled 2017-09-27: qty 5

## 2017-09-27 MED ORDER — SODIUM CHLORIDE 0.9% FLUSH
10.0000 mL | INTRAVENOUS | Status: DC | PRN
  Administered 2017-09-27: 10 mL
  Filled 2017-09-27: qty 10

## 2017-09-27 MED ORDER — SODIUM CHLORIDE 0.9 % IV SOLN
10.4000 mg/kg | Freq: Once | INTRAVENOUS | Status: AC
  Administered 2017-09-27: 1360 mg via INTRAVENOUS
  Filled 2017-09-27: qty 20

## 2017-09-27 MED ORDER — SODIUM CHLORIDE 0.9 % IV SOLN
Freq: Once | INTRAVENOUS | Status: AC
  Administered 2017-09-27: 11:00:00 via INTRAVENOUS

## 2017-09-27 NOTE — Telephone Encounter (Signed)
3 cycles scheduled already per 6/27 los.

## 2017-09-27 NOTE — Progress Notes (Signed)
[facility] [phone]   [phone]  OFFICE PROGRESS NOTE  Jason Russell Lei, MD [address] [place] [address]  DIAGNOSIS: Stage IIIA (T1a, N2, M0) non-small cell lung cancer, adenocarcinoma presented with right upper lobe lung nodule in addition to mediastinal lymphadenopathy diagnosed in March 2018.  Biomarker Findings Tumor Mutational Burden - TMB-Intermediate (8 Muts/Mb) Microsatellite Status - MS-Stable Genomic Findings For a complete list of the genes assayed, please refer to the Appendix. ERBB2 amplification - equivocal? DNMT3A K466f*192 FUBP1 Q40* KEAP1 G338f68 TP53 C275F 7 Disease relevant genes with no reportable alterations: EGFR, KRAS, ALK, BRAF, MET, RET, ROS1   PRIOR THERAPY: course of concurrent chemoradiation with weekly carboplatin for AUC of 2 and paclitaxel 45 MG/M2. Status post 6 cycles. Last cycle was given 10/16/2016.  CURRENT THERAPY:  Consolidation treatment with immunotherapy with Imfinzi (Durvalumab) 10 MG/M2 every 2 weeks. First dose 12/21/2016.  Status post 19 cycles.  INTERVAL HISTORY: Jason Garcialopez67.o. male returns to the clinic today for follow-up visit.  The patient is feeling fine with no concerning complaints.  He continues to tolerate his treatment with Imfinzi (Durvalumab) fairly well.  He denied having any chest pain, shortness of breath, cough or hemoptysis.  He has no nausea, vomiting, diarrhea or constipation.  He has no fever or chills.  He has no weight loss or night sweats.  Is here today for evaluation before starting cycle #20.  MEDICAL HISTORY: Past Medical History:  Diagnosis Date  . Adenocarcinoma of right lung, stage 3 ([facility]CM[facility]5/23/2018  . Adenocarcinoma of right lung, stage 3 (HCMandan5/23/2018  . Anemia   . Arthritis    "hx right hip"  . Asthma    "when I was a child"  . Atrial fibrillation (HCC)    Amiodarone started 10/2011; Coumadin  . Automatic implantable  cardioverter-defibrillator in situ 10/03/2012   a. St. Jude ICD implantation 10/03/12.  . Chronic anticoagulation   . Chronic fatigue 10/18/2016  . Chronic fatigue 10/18/2016  . Chronic systolic heart failure ([facility]   a. Echo 7/13: EF 25%;  b. echo 04/2012:  Mild LVH, EF 30-35%, Gr 1 DD, mild AI, mild MR, mild LAE  . COPD (chronic obstructive pulmonary disease) ([facility]  . Dyslipidemia   . Dysrhythmia   . Encounter for antineoplastic chemotherapy 08/24/2016  . Gout   . History of blood transfusion 10/15/2013   "don't know where the blood's going; HgB down to 5"  . Hyperlipidemia   . Hypertension   . Hypothyroidism   . NICM (nonischemic cardiomyopathy) ([facility]   [place]/14:  minimal CAD  . Obesity   . OSA on CPAP   . Tobacco abuse     ALLERGIES:  has No Known Allergies.  MEDICATIONS:  Current Outpatient Medications  Medication Sig Dispense Refill  . acetaminophen (TYLENOL) 500 MG tablet Take 1,000 mg by mouth every 6 (six) hours as needed for mild pain.    . Marland Kitchenlbuterol (PROAIR HFA) 108 (90 Base) MCG/ACT inhaler Inhale 2 puffs into the lungs every 4 (four) hours as needed for wheezing or shortness of breath. 1 Inhaler 2  . amiodarone (PACERONE) 100 MG tablet Take 1 tablet (100 mg total) by mouth daily. Please keep upcoming appt with Dr. RoHarrington Challengern June for future refills. Thank you 90 tablet 0  . atorvastatin (LIPITOR) 20 MG tablet TAKE 1 TABLET BY MOUTH EVERY DAY 90 tablet 2  . carvedilol (COREG) 12.5  MG tablet TAKE 1 TABLET (12.5 MG TOTAL) BY MOUTH 2 (TWO) TIMES DAILY. 180 tablet 1  . carvedilol (COREG) 25 MG tablet TAKE 1 TABLET BY MOUTH TWICE A DAY WITH A MEAL 60 tablet 11  . [employer] D3 2000 units CAPS Take 2,000 Units by mouth daily.   11  . feeding supplement, ENSURE ENLIVE, (ENSURE ENLIVE) LIQD Take 237 mLs by mouth 2 (two) times daily between meals. 237 mL 12  . ferrous gluconate (FERGON) 324 MG tablet Take 1 tablet (324 mg total) by mouth 3 (three) times daily with meals. 90 tablet 8  .  Fluticasone-Umeclidin-Vilant (TRELEGY ELLIPTA) 100-62.5-25 MCG/INH AEPB Inhale 1 puff into the lungs daily. 3 each 3  . furosemide (LASIX) 20 MG tablet TAKE 3 TABLETS (60 MG) EVERY TUE AND SAT, ALL OTHER DAYS TAKE 2 TABLETS (40 MG). 64 tablet 7  . hydrALAZINE (APRESOLINE) 100 MG tablet Take 1 tablet (100 mg total) by mouth 3 (three) times daily. Please keep upcoming appointment for further refills 90 tablet 0  . levothyroxine (SYNTHROID, LEVOTHROID) 50 MCG tablet TAKE 1 TABLET (50 MCG TOTAL) BY MOUTH DAILY BEFORE BREAKFAST. 90 tablet 3  . lidocaine-prilocaine (EMLA) cream Apply generous amount to port site at least 1 hr prior to treatment.  DO NOT RUB IN. 30 g 1  . lisinopril (PRINIVIL,ZESTRIL) 40 MG tablet Take 1 tablet (40 mg total) by mouth daily. Please keep 09/2017 appointment for further refills 90 tablet 0  . magnesium oxide (MAG-OX) 400 (241.3 Mg) MG tablet Take 1 tablet (400 mg total) by mouth daily. 30 tablet 0  . potassium chloride SA (KLOR-CON M20) 20 MEQ tablet TAKE 1 TABLET BY MOUTH EVERY DAY *ON DAYS WHEN LASIX IS TAKEN, TAKE 2 TABLETS*  Dr. Harrington Challenger for further refills. 3rd attempt 30 tablet 0  . prochlorperazine (COMPAZINE) 10 MG tablet Take 1 tablet (10 mg total) by mouth every 6 (six) hours as needed for nausea or vomiting. (Patient not taking: Reported on 08/02/2017) 30 tablet 0  . rivaroxaban (XARELTO) 20 MG TABS tablet Take 1 tablet (20 mg total) by mouth daily. 90 tablet 2  . sildenafil (VIAGRA) 100 MG tablet Take 100 mg by mouth.    . spironolactone (ALDACTONE) 25 MG tablet Take 0.5 tablets (12.5 mg total) by mouth daily. Please keep upcoming appt with Dr. Harrington Challenger in June for future refills. Thank you 45 tablet 0  . sucralfate (CARAFATE) 1 g tablet TAKE 1 TABLET BY MOUTH FOUR TIMES DAILY (5 MINUTES BEFORE MEALS AND AT BEDTIME) FOR ESOPHAGITIS 120 tablet 0  . ULORIC 40 MG tablet Take 1 tablet (40 mg total) by mouth daily. 30 tablet 0   No current facility-administered medications for this  visit.     SURGICAL HISTORY:  Past Surgical History:  Procedure Laterality Date  . CARDIAC DEFIBRILLATOR PLACEMENT  2014  . CARDIOVERSION  2011  . COLONOSCOPY WITH PROPOFOL Left 10/17/2013   Procedure: COLONOSCOPY WITH PROPOFOL;  Surgeon: Inda Castle, MD;  Location: [facility];  Service: Endoscopy;  Laterality: Left;  . ESOPHAGOGASTRODUODENOSCOPY N/A 10/17/2013   Procedure: ESOPHAGOGASTRODUODENOSCOPY (EGD);  Surgeon: Inda Castle, MD;  Location: [facility];  Service: Endoscopy;  Laterality: N/A;  . GIVENS CAPSULE STUDY N/A 10/29/2013   Procedure: GIVENS CAPSULE STUDY;  Surgeon: Inda Castle, MD;  Location: [facility];  Service: Endoscopy;  Laterality: N/A;  . IMPLANTABLE CARDIOVERTER DEFIBRILLATOR IMPLANT Left 10/03/2012   Procedure: IMPLANTABLE CARDIOVERTER DEFIBRILLATOR IMPLANT;  Surgeon: Deboraha Sprang, MD;  Location[facility]  CATH LAB;  Service: Cardiovascular;  Laterality: Left;  . IR FLUORO GUIDE PORT INSERTION RIGHT  09/21/2016  . IR US GUIDE VASC ACCESS RIGHT  09/21/2016  . JOINT REPLACEMENT    . TONSILLECTOMY  1950's  . TOTAL HIP ARTHROPLASTY Right 11/25/1997  . VIDEO BRONCHOSCOPY WITH ENDOBRONCHIAL ULTRASOUND N/A 08/09/2016   Procedure: VIDEO BRONCHOSCOPY WITH ENDOBRONCHIAL ULTRASOUND;  Surgeon: Javier Glazier, MD;  Location: [facility] OR;  Service: Thoracic;  Laterality: N/A;    REVIEW OF SYSTEMS:  A comprehensive review of systems was negative.   PHYSICAL EXAMINATION: General appearance: alert, cooperative and no distress Head: Normocephalic, without obvious abnormality, atraumatic Neck: no adenopathy, no JVD, supple, symmetrical, trachea midline and thyroid not enlarged, symmetric, no tenderness/mass/nodules Lymph nodes: Cervical, supraclavicular, and axillary nodes normal. Resp: clear to auscultation bilaterally Back: symmetric, no curvature. ROM normal. No CVA tenderness. Cardio: regular rate and rhythm, S1, S2 normal, no murmur, click, rub or gallop GI: soft,  non-tender; bowel sounds normal; no masses,  no organomegaly Extremities: extremities normal, atraumatic, no cyanosis or edema  ECOG PERFORMANCE STATUS: 1 - Symptomatic but completely ambulatory  Blood pressure 112/71, pulse (!) 121, temperature 97.9 F (36.6 C), temperature source Oral, resp. rate 18, height '6\' 1"'  (1.854 m), weight 294 lb 8 oz (133.6 kg), SpO2 100 %.  LABORATORY DATA: Lab Results  Component Value Date   WBC 4.0 09/27/2017   HGB 11.9 (L) 09/27/2017   HCT 36.3 (L) 09/27/2017   MCV 90.2 09/27/2017   PLT 139 (L) 09/27/2017      Chemistry      Component Value Date/Time   NA 140 09/13/2017 0811   NA 141 03/29/2017 1038   K 3.9 09/13/2017 0811   K 4.0 03/29/2017 1038   CL 105 09/13/2017 0811   CO2 27 09/13/2017 0811   CO2 27 03/29/2017 1038   BUN 32 (H) 09/13/2017 0811   BUN 28.6 (H) 03/29/2017 1038   CREATININE 1.58 (H) 09/13/2017 0811   CREATININE 1.6 (H) 03/29/2017 1038      Component Value Date/Time   CALCIUM 9.4 09/13/2017 0811   CALCIUM 9.3 03/29/2017 1038   ALKPHOS 117 09/13/2017 0811   ALKPHOS 105 03/29/2017 1038   AST 16 09/13/2017 0811   AST 18 03/29/2017 1038   ALT 12 09/13/2017 0811   ALT 13 03/29/2017 1038   BILITOT 0.4 09/13/2017 0811   BILITOT 0.62 03/29/2017 1038       RADIOGRAPHIC STUDIES: Ct Chest W Contrast  Result Date: 09/07/2017 CLINICAL DATA:  Stage IIIA non-small cell lung cancer, adenocarcinoma. Status post chemoradiation. Currently on immunotherapy. EXAM: CT CHEST WITH CONTRAST TECHNIQUE: Multidetector CT imaging of the chest was performed during intravenous contrast administration. CONTRAST:  34m OMNIPAQUE IOHEXOL 300 MG/ML  SOLN COMPARISON:  06/19/2017 FINDINGS: Cardiovascular: Pacer/AICD device. Aortic and branch vessel atherosclerosis. Mild cardiomegaly, without pericardial effusion. No central pulmonary embolism, on this non-dedicated study. Pulmonary artery enlargement, outflow tract 3.2 cm. Mediastinum/Nodes: No  supraclavicular adenopathy. No well-defined mediastinal adenopathy. Right-sided mediastinal edema is unchanged. No hilar adenopathy. Fluid level in the esophagus, including image 39/2 Lungs/Pleura: Slight increase in minimal right pleural fluid. Mild left pleural thickening. Moderate centrilobular emphysema. Similar appearance and configuration of paramediastinal primarily right upper lobe and less so superior segment right lower lobe radiation fibrosis. A right apical 4 mm nodule on image 18/7 is similar on prior exams including back to 07/11/2016, favoring a benign etiology. Clear left lung. Upper Abdomen: Normal imaged portions of the liver, spleen, stomach,  pancreas, gallbladder, adrenal glands,. Multiple right renal calculi at up to 7 mm. Low-density left renal lesion is likely a cyst. Musculoskeletal: Moderate bilateral gynecomastia. Moderate thoracic spondylosis. IMPRESSION: 1. Similar appearance of right paramediastinal radiation fibrosis. No evidence of recurrent or metastatic disease. 2. Right upper lobe pulmonary nodule of 4 mm is similar back to 07/11/2016, favored to be benign. 3. Aortic atherosclerosis (ICD10-I70.0) and emphysema (ICD10-J43.9). 4. Pulmonary artery enlargement suggests pulmonary arterial hypertension. 5. Right nephrolithiasis. 6. Esophageal air fluid level suggests dysmotility or gastroesophageal reflux. Electronically Signed   By: Abigail Miyamoto M.D.   On: 09/07/2017 11:48    ASSESSMENT AND PLAN:  This is a very pleasant 67 years old African-American male with a stage IIIa non-small cell lung cancer, adenocarcinoma.  The patient completed 6 weeks of concurrent chemoradiation with weekly carboplatin and paclitaxel and tolerated his treatment well except for odynophagia. The patient is currently on consolidation treatment with immunotherapy with Imfinzi (Durvalumab) status post 19 cycles.  He continues to tolerate this treatment well with no concerning complaints.  I recommended for  him to proceed with cycle #20 today as scheduled. I will see him back for follow-up visit in 2 weeks for evaluation before starting cycle #21. He was tachycardic today after walking from the waiting room to the exam room.  We will continue to monitor closely. The patient was advised to call immediately if he has any concerning symptoms in the interval. The patient voices understanding of current disease status and treatment options and is in agreement with the current care plan. All questions were answered. The patient knows to call the clinic with any problems, questions or concerns. We can certainly see the patient much sooner if necessary.  Disclaimer: This note was dictated with voice recognition software. Similar sounding words can inadvertently be transcribed and may not be corrected upon review.

## 2017-09-27 NOTE — Patient Instructions (Signed)
[facility] Discharge Instructions for Patients Receiving Chemotherapy  Today you received the following chemotherapy agents: durvalumab (Imfinzi).  To help prevent nausea and vomiting after your treatment, we encourage you to take your nausea medication as directed  If you develop nausea and vomiting that is not controlled by your nausea medication, call the clinic.   BELOW ARE SYMPTOMS THAT SHOULD BE REPORTED IMMEDIATELY:  *FEVER GREATER THAN 100.5 F  *CHILLS WITH OR WITHOUT FEVER  NAUSEA AND VOMITING THAT IS NOT CONTROLLED WITH YOUR NAUSEA MEDICATION  *UNUSUAL SHORTNESS OF BREATH  *UNUSUAL BRUISING OR BLEEDING  TENDERNESS IN MOUTH AND THROAT WITH OR WITHOUT PRESENCE OF ULCERS  *URINARY PROBLEMS  *BOWEL PROBLEMS  UNUSUAL RASH Items with * indicate a potential emergency and should be followed up as soon as possible.  Feel free to call the clinic you have any questions or concerns. The clinic phone number is [phone].

## 2017-09-27 NOTE — Progress Notes (Signed)
Per Dr Julien Nordmann it is okay to treat pt today with Imfinzi and todays creatinine.

## 2017-09-28 ENCOUNTER — Ambulatory Visit (INDEPENDENT_AMBULATORY_CARE_PROVIDER_SITE_OTHER): Payer: Medicare Other | Admitting: Internal Medicine

## 2017-09-28 VITALS — BP 124/64 | HR 67 | Ht 73.0 in | Wt 297.0 lb

## 2017-09-28 DIAGNOSIS — I10 Essential (primary) hypertension: Secondary | ICD-10-CM | POA: Diagnosis not present

## 2017-09-28 DIAGNOSIS — I428 Other cardiomyopathies: Secondary | ICD-10-CM

## 2017-09-28 DIAGNOSIS — I480 Paroxysmal atrial fibrillation: Secondary | ICD-10-CM

## 2017-09-28 DIAGNOSIS — I472 Ventricular tachycardia, unspecified: Secondary | ICD-10-CM

## 2017-09-28 DIAGNOSIS — I5022 Chronic systolic (congestive) heart failure: Secondary | ICD-10-CM | POA: Diagnosis not present

## 2017-09-28 NOTE — Patient Instructions (Signed)
Medication Instructions:  Your physician recommends that you continue on your current medications as directed. Please refer to the Current Medication list given to you today.    Labwork: None Ordered    Testing/Procedures: None Ordered    Follow-Up: Your physician recommends that you schedule a follow-up appointment in: December 2019 or January 2020 with Dr. Dorris Carnes     Any Other Special Instructions Will Be Listed Below (If Applicable).     If you need a refill on your cardiac medications before your next appointment, please call your pharmacy.

## 2017-09-28 NOTE — Progress Notes (Signed)
Cardiology Office Note   Date:  09/28/2017   ID:  Jason Russell, DOB 1950-05-02, MRN [id number]  PCP:  Lucianne Lei, MD  Cardiologist:   Dorris Carnes, MD   F/U of CHF      History of Present Illness: Jason Russell is a 67 y.o. male with a history ofHTN, sleep apnea, paroxysmal  AFib, systolic CHF and dilated CM. He was converted to NSR with Amiodarone (not a Tikosyn candidate with prolonged QT). EF was thought to down due to tachycardia in rapid AFib. Echo 7/13: EF 25%. Follow up echo 04/2012: Mild LVH, EF 30-35%, Without full recovery of LVF, he was referred for cardiac cath. LHC 07/15/12: No obstructive CAD. Last echo in October 2018 LVEF 35 to 40%  The pt is now being followed in the cancer center (Jason Russell) for lung CA   Underweent XRT and chemotherapy     The pt says his breathing is OK   He quit smoking in 2018 Denies CP   No palpitations   No dizziness Legs improved       Current Meds  Medication Sig  . acetaminophen (TYLENOL) 500 MG tablet Take 1,000 mg by mouth every 6 (six) hours as needed for mild pain.  [name] albuterol (PROAIR HFA) 108 (90 Base) MCG/ACT inhaler Inhale 2 puffs into the lungs every 4 (four) hours as needed for wheezing or shortness of breath.  [name] amiodarone (PACERONE) 100 MG tablet Take 1 tablet (100 mg total) by mouth daily. Please keep upcoming appt with Dr. Harrington Challenger in June for future refills. Thank you  . atorvastatin (LIPITOR) 20 MG tablet TAKE 1 TABLET BY MOUTH EVERY DAY  . carvedilol (COREG) 12.5 MG tablet TAKE 1 TABLET (12.5 MG TOTAL) BY MOUTH 2 (TWO) TIMES DAILY.  . CVS D3 2000 units CAPS Take 2,000 Units by mouth daily.   . feeding supplement, ENSURE ENLIVE, (ENSURE ENLIVE) LIQD Take 237 mLs by mouth 2 (two) times daily between meals.  . ferrous gluconate (FERGON) 324 MG tablet Take 1 tablet (324 mg total) by mouth 3 (three) times daily with meals.  . Fluticasone-Umeclidin-Vilant (TRELEGY ELLIPTA) 100-62.5-25 MCG/INH AEPB Inhale 1 puff  into the lungs daily.  . furosemide (LASIX) 20 MG tablet TAKE 3 TABLETS (60 MG) EVERY TUE AND SAT, ALL OTHER DAYS TAKE 2 TABLETS (40 MG).  . hydrALAZINE (APRESOLINE) 100 MG tablet Take 1 tablet (100 mg total) by mouth 3 (three) times daily. Please keep upcoming appointment for further refills  . levothyroxine (SYNTHROID, LEVOTHROID) 50 MCG tablet TAKE 1 TABLET (50 MCG TOTAL) BY MOUTH DAILY BEFORE BREAKFAST.  [name] lidocaine-prilocaine (EMLA) cream Apply generous amount to port site at least 1 hr prior to treatment.  DO NOT RUB IN.  [name] lisinopril (PRINIVIL,ZESTRIL) 40 MG tablet Take 1 tablet (40 mg total) by mouth daily. Please keep 09/2017 appointment for further refills  . magnesium oxide (MAG-OX) 400 (241.3 Mg) MG tablet Take 1 tablet (400 mg total) by mouth daily.  . potassium chloride SA (KLOR-CON M20) 20 MEQ tablet TAKE 1 TABLET BY MOUTH EVERY DAY *ON DAYS WHEN LASIX IS TAKEN, TAKE 2 TABLETS*  Dr. Harrington Challenger for further refills. 3rd attempt  . prochlorperazine (COMPAZINE) 10 MG tablet Take 1 tablet (10 mg total) by mouth every 6 (six) hours as needed for nausea or vomiting.  . rivaroxaban (XARELTO) 20 MG TABS tablet Take 1 tablet (20 mg total) by mouth daily.  . sildenafil (VIAGRA) 100 MG tablet Take 100 mg by mouth.  [name]  spironolactone (ALDACTONE) 25 MG tablet Take 0.5 tablets (12.5 mg total) by mouth daily. Please keep upcoming appt with Dr. Harrington Challenger in June for future refills. Thank you  . sucralfate (CARAFATE) 1 g tablet TAKE 1 TABLET BY MOUTH FOUR TIMES DAILY (5 MINUTES BEFORE MEALS AND AT BEDTIME) FOR ESOPHAGITIS  . ULORIC 40 MG tablet Take 1 tablet (40 mg total) by mouth daily.     Allergies:   Patient has no known allergies.   Past Medical History:  Diagnosis Date  . Adenocarcinoma of right lung, stage 3 ([place]) 08/23/2016  . Adenocarcinoma of right lung, stage 3 ([facility]) 08/23/2016  . Anemia   . Arthritis    "hx right hip"  . Asthma    "when I was a child"  . Atrial fibrillation (HCC)    Amiodarone  started 10/2011; Coumadin  . 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 St. Jude ICD implantation 10/03/12.  . Chronic anticoagulation   . Chronic fatigue 10/18/2016  . Chronic fatigue 10/18/2016  . Chronic systolic heart failure ([facility])    a. Echo 7/13: EF 25%;  b. echo 04/2012:  Mild LVH, EF 30-35%, Gr 1 DD, mild AI, mild MR, mild LAE  . COPD (chronic obstructive pulmonary disease) ([place])   . Dyslipidemia   . Dysrhythmia   . Encounter for antineoplastic chemotherapy 08/24/2016  . Gout   . History of blood transfusion 10/15/2013   "don't know where the blood's going; HgB down to 5"  . Hyperlipidemia   . Hypertension   . Hypothyroidism   . NICM (nonischemic cardiomyopathy) ([facility])    [place] 4/14:  minimal CAD  . Obesity   . OSA on CPAP   . Tobacco abuse     Past Surgical History:  Procedure Laterality Date  . CARDIAC DEFIBRILLATOR PLACEMENT  2014  . CARDIOVERSION  2011  . COLONOSCOPY WITH PROPOFOL Left 10/17/2013   Procedure: COLONOSCOPY WITH PROPOFOL;  Surgeon: Inda Castle, MD;  Location: [facility];  Service: Endoscopy;  Laterality: Left;  . ESOPHAGOGASTRODUODENOSCOPY N/A 10/17/2013   Procedure: ESOPHAGOGASTRODUODENOSCOPY (EGD);  Surgeon: Inda Castle, MD;  Location: [facility];  Service: Endoscopy;  Laterality: N/A;  . GIVENS CAPSULE STUDY N/A 10/29/2013   Procedure: GIVENS CAPSULE STUDY;  Surgeon: Inda Castle, MD;  Location: [facility];  Service: Endoscopy;  Laterality: N/A;  . IMPLANTABLE CARDIOVERTER DEFIBRILLATOR IMPLANT Left 10/03/2012   Procedure: IMPLANTABLE CARDIOVERTER DEFIBRILLATOR IMPLANT;  Surgeon: Deboraha Sprang, MD;  Location[facility] CATH LAB;  Service: Cardiovascular;  Laterality: Left;  . IR FLUORO GUIDE PORT INSERTION RIGHT  09/21/2016  . IR US GUIDE VASC ACCESS RIGHT  09/21/2016  . JOINT REPLACEMENT    . TONSILLECTOMY  1950's  . TOTAL HIP ARTHROPLASTY Right 11/25/1997  . VIDEO BRONCHOSCOPY WITH ENDOBRONCHIAL ULTRASOUND N/A 08/09/2016    Procedure: VIDEO BRONCHOSCOPY WITH ENDOBRONCHIAL ULTRASOUND;  Surgeon: Javier Glazier, MD;  Location: [facility];  Service: Thoracic;  Laterality: N/A;     Social History:  The patient  reports that he quit smoking about 14 months ago. His smoking use included cigarettes. He has a 11.25 pack-year smoking history. He has never used smokeless tobacco. He reports that he drinks about 0.6 oz of alcohol per week. He reports that he does not use drugs.   Family History:  The patient's family history includes Heart Problems in his mother; Hypertension in his mother; Lung cancer in his brother; Other in his father.    ROS:  Please see the history  of present illness. All other systems are reviewed and  Negative to the above problem except as noted.    PHYSICAL EXAM: VS:  BP 124/64   Pulse 67   Ht 6\' 1"  (1.854 m)   Wt 134.7 kg (297 lb)   BMI 39.18 kg/m   GEN: Morbidly obese 67 yo in no acute distress  HEENT: normal  Neck: Neck full  , carotid bruits, or masses Cardiac: RRR; no murmurs, rubs, or gallops,  Tr LE  edema  Respiratory:  clear to auscultation bilaterally, normal work of breathing GI: soft, nontender, nondistended, + BS  No hepatomegaly  MS: no deformity Moving all extremities   Skin: warm and dry, no rash Neuro:  Strength and sensation are intact Psych: euthymic mood, full affect   EKG:  EKG is ordered today.  SR 67   bpm  LVH      Lipid Panel    Component Value Date/Time   CHOL 150 12/23/2012 1205   TRIG 73.0 12/23/2012 1205   HDL 46.60 12/23/2012 1205   CHOLHDL 3 12/23/2012 1205   VLDL 14.6 12/23/2012 1205   LDLCALC 89 12/23/2012 1205      Wt Readings from Last 3 Encounters:  09/28/17 134.7 kg (297 lb)  09/27/17 133.6 kg (294 lb 8 oz)  09/13/17 134.7 kg (297 lb)      ASSESSMENT AND PLAN:  1  Paroxysmal atrial fib  On amio  Remains in SR  Cont current regimen    2  Chronic systolic CHF  Volume is not bad, better than he has been   3  HTN  BP is good     4   Onc   Follows in cancer center   I will set to see pt in winter  Current medicines are reviewed at length with the patient today.  The patient does not have concerns regarding medicines.  Signed, Dorris Carnes, MD  09/28/2017 3:17 PM    [address], [place], [place]  [address] Phone: [phone]; Fax: [phone]

## 2017-09-30 ENCOUNTER — Other Ambulatory Visit: Payer: Self-pay | Admitting: Internal Medicine

## 2017-10-10 ENCOUNTER — Other Ambulatory Visit: Payer: Self-pay

## 2017-10-10 ENCOUNTER — Other Ambulatory Visit: Payer: Self-pay | Admitting: Internal Medicine

## 2017-10-11 ENCOUNTER — Encounter: Payer: Self-pay | Admitting: Internal Medicine

## 2017-10-11 ENCOUNTER — Ambulatory Visit (INDEPENDENT_AMBULATORY_CARE_PROVIDER_SITE_OTHER): Payer: Medicare Other

## 2017-10-11 ENCOUNTER — Telehealth: Payer: Self-pay | Admitting: Internal Medicine

## 2017-10-11 ENCOUNTER — Inpatient Hospital Stay: Payer: Medicare Other | Attending: Internal Medicine

## 2017-10-11 ENCOUNTER — Inpatient Hospital Stay: Payer: Medicare Other

## 2017-10-11 ENCOUNTER — Inpatient Hospital Stay (HOSPITAL_BASED_OUTPATIENT_CLINIC_OR_DEPARTMENT_OTHER): Payer: Medicare Other | Admitting: Internal Medicine

## 2017-10-11 VITALS — BP 160/87 | HR 73 | Temp 98.3°F | Resp 18 | Ht 73.0 in | Wt 296.4 lb

## 2017-10-11 DIAGNOSIS — Z95810 Presence of automatic (implantable) cardiac defibrillator: Secondary | ICD-10-CM | POA: Diagnosis not present

## 2017-10-11 DIAGNOSIS — M1611 Unilateral primary osteoarthritis, right hip: Secondary | ICD-10-CM | POA: Diagnosis not present

## 2017-10-11 DIAGNOSIS — E039 Hypothyroidism, unspecified: Secondary | ICD-10-CM

## 2017-10-11 DIAGNOSIS — Z7901 Long term (current) use of anticoagulants: Secondary | ICD-10-CM | POA: Insufficient documentation

## 2017-10-11 DIAGNOSIS — Z9989 Dependence on other enabling machines and devices: Secondary | ICD-10-CM | POA: Insufficient documentation

## 2017-10-11 DIAGNOSIS — Z9221 Personal history of antineoplastic chemotherapy: Secondary | ICD-10-CM

## 2017-10-11 DIAGNOSIS — E785 Hyperlipidemia, unspecified: Secondary | ICD-10-CM | POA: Diagnosis not present

## 2017-10-11 DIAGNOSIS — Z79899 Other long term (current) drug therapy: Secondary | ICD-10-CM | POA: Diagnosis not present

## 2017-10-11 DIAGNOSIS — J449 Chronic obstructive pulmonary disease, unspecified: Secondary | ICD-10-CM

## 2017-10-11 DIAGNOSIS — I4891 Unspecified atrial fibrillation: Secondary | ICD-10-CM | POA: Diagnosis not present

## 2017-10-11 DIAGNOSIS — I5022 Chronic systolic (congestive) heart failure: Secondary | ICD-10-CM | POA: Diagnosis not present

## 2017-10-11 DIAGNOSIS — Z5112 Encounter for antineoplastic immunotherapy: Secondary | ICD-10-CM

## 2017-10-11 DIAGNOSIS — R590 Localized enlarged lymph nodes: Secondary | ICD-10-CM | POA: Diagnosis not present

## 2017-10-11 DIAGNOSIS — C3411 Malignant neoplasm of upper lobe, right bronchus or lung: Secondary | ICD-10-CM

## 2017-10-11 DIAGNOSIS — G4733 Obstructive sleep apnea (adult) (pediatric): Secondary | ICD-10-CM | POA: Diagnosis not present

## 2017-10-11 DIAGNOSIS — I110 Hypertensive heart disease with heart failure: Secondary | ICD-10-CM | POA: Insufficient documentation

## 2017-10-11 DIAGNOSIS — Z923 Personal history of irradiation: Secondary | ICD-10-CM | POA: Insufficient documentation

## 2017-10-11 LAB — CBC WITH DIFFERENTIAL/PLATELET
BASOS PCT: 1 %
Basophils Absolute: 0 10*3/uL (ref 0.0–0.1)
EOS ABS: 0.1 10*3/uL (ref 0.0–0.5)
EOS PCT: 3 %
HCT: 34.8 % — ABNORMAL LOW (ref 38.4–49.9)
Hemoglobin: 11.2 g/dL — ABNORMAL LOW (ref 13.0–17.1)
Lymphocytes Relative: 16 %
Lymphs Abs: 0.6 10*3/uL — ABNORMAL LOW (ref 0.9–3.3)
MCH: 29.9 pg (ref 27.2–33.4)
MCHC: 32.2 g/dL (ref 32.0–36.0)
MCV: 92.8 fL (ref 79.3–98.0)
MONOS PCT: 13 %
Monocytes Absolute: 0.5 10*3/uL (ref 0.1–0.9)
Neutro Abs: 2.7 10*3/uL (ref 1.5–6.5)
Neutrophils Relative %: 67 %
Platelets: 145 10*3/uL (ref 140–400)
RBC: 3.75 MIL/uL — ABNORMAL LOW (ref 4.20–5.82)
RDW: 15.3 % — ABNORMAL HIGH (ref 11.0–14.6)
WBC: 3.9 10*3/uL — ABNORMAL LOW (ref 4.0–10.3)

## 2017-10-11 LAB — COMPREHENSIVE METABOLIC PANEL
ALK PHOS: 114 U/L (ref 38–126)
ALT: 17 U/L (ref 0–44)
AST: 17 U/L (ref 15–41)
Albumin: 3.5 g/dL (ref 3.5–5.0)
Anion gap: 7 (ref 5–15)
BILIRUBIN TOTAL: 0.3 mg/dL (ref 0.3–1.2)
BUN: 25 mg/dL — AB (ref 8–23)
CALCIUM: 9.2 mg/dL (ref 8.9–10.3)
CHLORIDE: 108 mmol/L (ref 98–111)
CO2: 28 mmol/L (ref 22–32)
CREATININE: 1.64 mg/dL — AB (ref 0.61–1.24)
GFR calc Af Amer: 49 mL/min — ABNORMAL LOW (ref >60)
GFR calc non Af Amer: 42 mL/min — ABNORMAL LOW (ref >60)
Glucose, Bld: 107 mg/dL — ABNORMAL HIGH (ref 70–99)
Potassium: 4.2 mmol/L (ref 3.5–5.1)
Sodium: 143 mmol/L (ref 135–145)
Total Protein: 6.2 g/dL — ABNORMAL LOW (ref 6.5–8.1)

## 2017-10-11 LAB — TSH: TSH: 2.168 u[IU]/mL (ref 0.320–4.118)

## 2017-10-11 MED ORDER — HEPARIN SOD (PORK) LOCK FLUSH 100 UNIT/ML IV SOLN
500.0000 [IU] | Freq: Once | INTRAVENOUS | Status: AC | PRN
  Administered 2017-10-11: 500 [IU]
  Filled 2017-10-11: qty 5

## 2017-10-11 MED ORDER — SODIUM CHLORIDE 0.9 % IV SOLN
Freq: Once | INTRAVENOUS | Status: AC
  Administered 2017-10-11: 10:00:00 via INTRAVENOUS

## 2017-10-11 MED ORDER — DEXAMETHASONE SODIUM PHOSPHATE 10 MG/ML IJ SOLN
INTRAMUSCULAR | Status: AC
  Filled 2017-10-11: qty 1

## 2017-10-11 MED ORDER — SODIUM CHLORIDE 0.9% FLUSH
10.0000 mL | INTRAVENOUS | Status: DC | PRN
  Administered 2017-10-11: 10 mL
  Filled 2017-10-11: qty 10

## 2017-10-11 MED ORDER — SODIUM CHLORIDE 0.9 % IV SOLN
10.4000 mg/kg | Freq: Once | INTRAVENOUS | Status: AC
  Administered 2017-10-11: 1360 mg via INTRAVENOUS
  Filled 2017-10-11: qty 20

## 2017-10-11 NOTE — Progress Notes (Signed)
[place] [phone]   [phone]  OFFICE PROGRESS NOTE  Lucianne Lei, MD [address] [place] [address]  DIAGNOSIS: Stage IIIA (T1a, N2, M0) non-small cell lung cancer, adenocarcinoma presented with right upper lobe lung nodule in addition to mediastinal lymphadenopathy diagnosed in March 2018.  Biomarker Findings Tumor Mutational Burden - TMB-Intermediate (8 Muts/Mb) Microsatellite Status - MS-Stable Genomic Findings For a complete list of the genes assayed, please refer to the Appendix. ERBB2 amplification - equivocal? DNMT3A K435f*192 FUBP1 Q40* KEAP1 [id number] TP53 C275F 7 Disease relevant genes with no reportable alterations: EGFR, KRAS, ALK, BRAF, MET, RET, ROS1   PRIOR THERAPY: course of concurrent chemoradiation with weekly carboplatin for AUC of 2 and paclitaxel 45 MG/M2. Status post 6 cycles. Last cycle was given 10/16/2016.  CURRENT THERAPY:  Consolidation treatment with immunotherapy with Imfinzi (Durvalumab) 10 MG/M2 every 2 weeks. First dose 12/21/2016.  Status post 20 cycles.  INTERVAL HISTORY: Jason Kumpf67.o. male returns to the clinic today for follow-up visit.  The patient is feeling fine today with no concerning complaints.  He continues to tolerate his treatment with Imfinzi (Durvalumab) fairly well.  He denied having any chest pain, shortness breath, cough or hemoptysis.  He denied having any fever or chills.  He has no nausea, vomiting, diarrhea or constipation.  He denied having any skin rash.  He is here today for evaluation before starting cycle #21.  MEDICAL HISTORY: Past Medical History:  Diagnosis Date  . Adenocarcinoma of right lung, stage 3 ([facility] [place]5/23/2018  . Adenocarcinoma of right lung, stage 3 (HCMignon5/23/2018  . Anemia   . Arthritis    "hx right hip"  . Asthma    "when I was a child"  . Atrial fibrillation (HCC)    Amiodarone started 10/2011; Coumadin  . Automatic implantable  cardioverter-defibrillator in situ 10/03/2012   a. St. Jude ICD implantation 10/03/12.  . Chronic anticoagulation   . Chronic fatigue 10/18/2016  . Chronic fatigue 10/18/2016  . Chronic systolic heart failure ([facility]   a. Echo 7/13: EF 25%;  b. echo 04/2012:  Mild LVH, EF 30-35%, Gr 1 DD, mild AI, mild MR, mild LAE  . COPD (chronic obstructive pulmonary disease) ([facility]ennes[facility]  . Dyslipidemia   . Dysrhythmia   . Encounter for antineoplastic chemotherapy 08/24/2016  . Gout   . History of blood transfusion 10/15/2013   "don't know where the blood's going; HgB down to 5"  . Hyperlipidemia   . Hypertension   . Hypothyroidism   . NICM (nonischemic cardiomyopathy) ([facility]   [facility]/14:  minimal CAD  . Obesity   . OSA on CPAP   . Tobacco abuse     ALLERGIES:  has No Known Allergies.  MEDICATIONS:  Current Outpatient Medications  Medication Sig Dispense Refill  . acetaminophen (TYLENOL) 500 MG tablet Take 1,000 mg by mouth every 6 (six) hours as needed for mild pain.    . Marland Kitchenlbuterol (PROAIR HFA) 108 (90 Base) MCG/ACT inhaler Inhale 2 puffs into the lungs every 4 (four) hours as needed for wheezing or shortness of breath. 1 Inhaler 2  . amiodarone (PACERONE) 100 MG tablet Take 1 tablet (100 mg total) by mouth daily. Please keep upcoming appt with Dr. RoHarrington Challengern June for future refills. Thank you 90 tablet 0  . atorvastatin (LIPITOR) 20 MG tablet TAKE 1 TABLET BY MOUTH EVERY DAY 90 tablet 2  . carvedilol (COREG) 12.5  MG tablet TAKE 1 TABLET (12.5 MG TOTAL) BY MOUTH 2 (TWO) TIMES DAILY. 180 tablet 1  . [facility] D3 2000 units CAPS Take 2,000 Units by mouth daily.   11  . feeding supplement, ENSURE ENLIVE, (ENSURE ENLIVE) LIQD Take 237 mLs by mouth 2 (two) times daily between meals. 237 mL 12  . ferrous gluconate (FERGON) 324 MG tablet Take 1 tablet (324 mg total) by mouth 3 (three) times daily with meals. 90 tablet 8  . Fluticasone-Umeclidin-Vilant (TRELEGY ELLIPTA) 100-62.5-25 MCG/INH AEPB Inhale 1 puff into the lungs  daily. 3 each 3  . furosemide (LASIX) 20 MG tablet TAKE 3 TABLETS (60 MG) EVERY TUE AND SAT, ALL OTHER DAYS TAKE 2 TABLETS (40 MG). 64 tablet 7  . hydrALAZINE (APRESOLINE) 100 MG tablet TAKE 1 TABLET BY MOUTH 3 (THREE) TIMES DAILY. PLEASE KEEP UPCOMING APPOINTMENT FOR FURTHER REFILLS 30 tablet 1  . levothyroxine (SYNTHROID, LEVOTHROID) 50 MCG tablet TAKE 1 TABLET (50 MCG TOTAL) BY MOUTH DAILY BEFORE BREAKFAST. 90 tablet 3  . lidocaine-prilocaine (EMLA) cream Apply generous amount to port site at least 1 hr prior to treatment.  DO NOT RUB IN. 30 g 1  . lisinopril (PRINIVIL,ZESTRIL) 40 MG tablet Take 1 tablet (40 mg total) by mouth daily. Please keep 09/2017 appointment for further refills 90 tablet 0  . magnesium oxide (MAG-OX) 400 (241.3 Mg) MG tablet Take 1 tablet (400 mg total) by mouth daily. 30 tablet 0  . potassium chloride SA (KLOR-CON M20) 20 MEQ tablet TAKE 1 TABLET BY MOUTH EVERY DAY *ON DAYS WHEN LASIX IS TAKEN, TAKE 2 TABLETS*  Dr. Harrington Challenger for further refills. 3rd attempt 30 tablet 0  . prochlorperazine (COMPAZINE) 10 MG tablet Take 1 tablet (10 mg total) by mouth every 6 (six) hours as needed for nausea or vomiting. 30 tablet 0  . rivaroxaban (XARELTO) 20 MG TABS tablet Take 1 tablet (20 mg total) by mouth daily. 90 tablet 2  . sildenafil (VIAGRA) 100 MG tablet Take 100 mg by mouth.    . spironolactone (ALDACTONE) 25 MG tablet Take 0.5 tablets (12.5 mg total) by mouth daily. Please keep upcoming appt with Dr. Harrington Challenger in June for future refills. Thank you 45 tablet 0  . sucralfate (CARAFATE) 1 g tablet TAKE 1 TABLET BY MOUTH FOUR TIMES DAILY (5 MINUTES BEFORE MEALS AND AT BEDTIME) FOR ESOPHAGITIS 120 tablet 0  . ULORIC 40 MG tablet Take 1 tablet (40 mg total) by mouth daily. 30 tablet 0   No current facility-administered medications for this visit.     SURGICAL HISTORY:  Past Surgical History:  Procedure Laterality Date  . CARDIAC DEFIBRILLATOR PLACEMENT  2014  . CARDIOVERSION  2011  .  COLONOSCOPY WITH PROPOFOL Left 10/17/2013   Procedure: COLONOSCOPY WITH PROPOFOL;  Surgeon: Inda Castle, MD;  Location: [facility];  Service: Endoscopy;  Laterality: Left;  . ESOPHAGOGASTRODUODENOSCOPY N/A 10/17/2013   Procedure: ESOPHAGOGASTRODUODENOSCOPY (EGD);  Surgeon: Inda Castle, MD;  Location: [facility];  Service: Endoscopy;  Laterality: N/A;  . GIVENS CAPSULE STUDY N/A 10/29/2013   Procedure: GIVENS CAPSULE STUDY;  Surgeon: Inda Castle, MD;  Location: [facility];  Service: Endoscopy;  Laterality: N/A;  . IMPLANTABLE CARDIOVERTER DEFIBRILLATOR IMPLANT Left 10/03/2012   Procedure: IMPLANTABLE CARDIOVERTER DEFIBRILLATOR IMPLANT;  Surgeon: Deboraha Sprang, MD;  Location: [facility] CATH LAB;  Service: Cardiovascular;  Laterality: Left;  . IR FLUORO GUIDE PORT INSERTION RIGHT  09/21/2016  . IR US GUIDE VASC ACCESS RIGHT  09/21/2016  .  JOINT REPLACEMENT    . TONSILLECTOMY  1950's  . TOTAL HIP ARTHROPLASTY Right 11/25/1997  . VIDEO BRONCHOSCOPY WITH ENDOBRONCHIAL ULTRASOUND N/A 08/09/2016   Procedure: VIDEO BRONCHOSCOPY WITH ENDOBRONCHIAL ULTRASOUND;  Surgeon: Javier Glazier, MD;  Location: [facility] OR;  Service: Thoracic;  Laterality: N/A;    REVIEW OF SYSTEMS:  A comprehensive review of systems was negative.   PHYSICAL EXAMINATION: General appearance: alert, cooperative and no distress Head: Normocephalic, without obvious abnormality, atraumatic Neck: no adenopathy, no JVD, supple, symmetrical, trachea midline and thyroid not enlarged, symmetric, no tenderness/mass/nodules Lymph nodes: Cervical, supraclavicular, and axillary nodes normal. Resp: clear to auscultation bilaterally Back: symmetric, no curvature. ROM normal. No CVA tenderness. Cardio: regular rate and rhythm, S1, S2 normal, no murmur, click, rub or gallop GI: soft, non-tender; bowel sounds normal; no masses,  no organomegaly Extremities: extremities normal, atraumatic, no cyanosis or edema  ECOG PERFORMANCE STATUS: 1 -  Symptomatic but completely ambulatory  Blood pressure (!) 160/87, pulse 73, temperature 98.3 F (36.8 C), temperature source Oral, resp. rate 18, height '6\' 1"'$  (1.854 m), weight 296 lb 6.4 oz (134.4 kg), SpO2 100 %.  LABORATORY DATA: Lab Results  Component Value Date   WBC 3.9 (L) 10/11/2017   HGB 11.2 (L) 10/11/2017   HCT 34.8 (L) 10/11/2017   MCV 92.8 10/11/2017   PLT 145 10/11/2017      Chemistry      Component Value Date/Time   NA 144 09/27/2017 0908   NA 141 03/29/2017 1038   K 3.6 09/27/2017 0908   K 4.0 03/29/2017 1038   CL 107 09/27/2017 0908   CO2 29 09/27/2017 0908   CO2 27 03/29/2017 1038   BUN 27 (H) 09/27/2017 0908   BUN 28.6 (H) 03/29/2017 1038   CREATININE 1.65 (H) 09/27/2017 0908   CREATININE 1.6 (H) 03/29/2017 1038      Component Value Date/Time   CALCIUM 9.3 09/27/2017 0908   CALCIUM 9.3 03/29/2017 1038   ALKPHOS 103 09/27/2017 0908   ALKPHOS 105 03/29/2017 1038   AST 18 09/27/2017 0908   AST 18 03/29/2017 1038   ALT 17 09/27/2017 0908   ALT 13 03/29/2017 1038   BILITOT 0.5 09/27/2017 0908   BILITOT 0.62 03/29/2017 1038       RADIOGRAPHIC STUDIES: No results found.  ASSESSMENT AND PLAN:  This is a very pleasant 67 years old African-American male with a stage IIIa non-small cell lung cancer, adenocarcinoma.  The patient completed 6 weeks of concurrent chemoradiation with weekly carboplatin and paclitaxel and tolerated his treatment well except for odynophagia. The patient is currently on consolidation treatment with immunotherapy with Imfinzi (Durvalumab) status post 20 cycles.  The patient has no complaints today.  He continues to tolerate the treatment well. I recommended for him to proceed with cycle #21 today as a scheduled. For hypertension, I strongly recommended for him to take his blood pressure medication as prescribed and to monitor it closely at home.  He was advised also to reach out to his primary care physician for adjustment of his  medication if needed. The patient will come back for follow-up visit in 2 weeks for evaluation before the next cycle of his treatment. He was advised to call immediately if he has any concerning symptoms in the interval. The patient voices understanding of current disease status and treatment options and is in agreement with the current care plan. All questions were answered. The patient knows to call the clinic with any problems, questions or concerns.  We can certainly see the patient much sooner if necessary.  Disclaimer: This note was dictated with voice recognition software. Similar sounding words can inadvertently be transcribed and may not be corrected upon review.

## 2017-10-11 NOTE — Patient Instructions (Signed)
[facility] Discharge Instructions for Patients Receiving Chemotherapy  Today you received the following chemotherapy agents: durvalumab (Imfinzi).  To help prevent nausea and vomiting after your treatment, we encourage you to take your nausea medication as directed  If you develop nausea and vomiting that is not controlled by your nausea medication, call the clinic.   BELOW ARE SYMPTOMS THAT SHOULD BE REPORTED IMMEDIATELY:  *FEVER GREATER THAN 100.5 F  *CHILLS WITH OR WITHOUT FEVER  NAUSEA AND VOMITING THAT IS NOT CONTROLLED WITH YOUR NAUSEA MEDICATION  *UNUSUAL SHORTNESS OF BREATH  *UNUSUAL BRUISING OR BLEEDING  TENDERNESS IN MOUTH AND THROAT WITH OR WITHOUT PRESENCE OF ULCERS  *URINARY PROBLEMS  *BOWEL PROBLEMS  UNUSUAL RASH Items with * indicate a potential emergency and should be followed up as soon as possible.  Feel free to call the clinic you have any questions or concerns. The clinic phone number is [phone].

## 2017-10-11 NOTE — Telephone Encounter (Signed)
Scheduled apt per 7/11 los - pt to get an updated schedule in the treatment area.

## 2017-10-12 ENCOUNTER — Telehealth: Payer: Self-pay

## 2017-10-12 ENCOUNTER — Other Ambulatory Visit: Payer: Self-pay | Admitting: Adult Health

## 2017-10-12 MED ORDER — RIVAROXABAN 20 MG PO TABS: 20.0000 mg | ORAL_TABLET | Freq: Every day | ORAL | 2 refills | Status: DC

## 2017-10-12 NOTE — Telephone Encounter (Signed)
Remote ICM transmission received.  Attempted call to patient and left message to return call. 

## 2017-10-12 NOTE — Telephone Encounter (Signed)
Received [facility]ed [facility] from [facility]:  Xarelto 20mg  QD #90, 2 refills  Last refill 5.9.18 by Dr Ashok Cordia Previous refill by Dr Dorris Carnes  Pt does not have diagnosis of PE or DVT on problem list Does have A-fib with Xarelto listed under that problem  Refill denied with note to pharmacy to please defer to pt's cardiologist Dr Dorris Carnes  Will sign off

## 2017-10-12 NOTE — Progress Notes (Signed)
EPIC Encounter for ICM Monitoring  Patient Name: Jason Russell is a 67 y.o. male Date: 10/12/2017 Primary Care Physican: Lucianne Lei, MD Primary Cardiologist:Ross Electrophysiologist: Faustino Congress Weight: Approximately 285lbs       Attempted call to patient and unable to reach.  Left message to return call.  Transmission reviewed.    Thoracic impedance normal.  Prescribed dosage: Furosemide 20 mg 3 tablets (60 mg total) every Tuesday and Saturday and 40 mg all the other days. Potassium 20 mEq 1 tablet every day and on days when lasix is taken, take 2 tablets  Labs: 08/30/2017 Creatinine 1.76, BUN 26, Potassium 3.9, Sodium 142, EGFR 39-45 08/16/2017 Creatinine 1.52, BUN 23, Potassium 4.0, Sodium 142, EGFR 46-54 08/02/2017 Creatinine 1.52, BUN 28, Potassium 3.7, Sodium 143, EGFR 46-54 07/19/2017 Creatinine 1.49, BUN 24, Potassium 4.0, Sodium 143, EGFR 48-55 07/05/2017 Creatinine1.65, BUN25, Potassium3.7, [id number], [id number] 06/21/2017 Creatinine1.60, BUN27, Potassium4.0, Sodium141, LPNP00-51  06/07/2017 Creatinine1.33, BUN29, Potassium4.0, [id number], EGFR55->60  05/24/2017 Creatinine1.60, BUN25, Potassium4.2, Sodium142, APOL41-03  05/10/2017 Creatinine1.54, BUN26, Potassium3.4, [id number], LNZV72-82 04/26/2017 Creatinine1.52, BUN29, Potassium3.9, SUORVI153, PHKF27-61 01/10/2019Creatinine 1.72, BUN37, Potassium3.6, [id number], BBUY37-09 A complete set of results can be found in results review.  Recommendations: NONE - Unable to reach.  Follow-up plan: ICM clinic phone appointment on 11/15/2017.    Copy of ICM check sent to Dr. Caryl Comes.   3 month ICM trend: 10/11/2017    1 Year ICM trend:       [name], RN 10/12/2017 2:27 PM

## 2017-10-12 NOTE — Progress Notes (Signed)
Patient returned call and he stated he is doing well.  Weight is stable at 284 and denied any fluid symptoms.  Encouraged to call if fluid symptoms develop.  Office appointment with Dr Caryl Comes on 11/28/17 and advised to complete DPR form at the visit.  Next ICM remote transmission 11/15/2017.

## 2017-10-24 ENCOUNTER — Other Ambulatory Visit: Payer: Self-pay | Admitting: Internal Medicine

## 2017-10-24 NOTE — Assessment & Plan Note (Addendum)
This is a very pleasant 67 year old African-American male with a stage IIIa non-small cell lung cancer, adenocarcinoma.  The patient completed 6 weeks of concurrent chemoradiation with weekly carboplatin and paclitaxel and tolerated his treatment well except for odynophagia. The patient is currently on consolidation treatment with immunotherapy with Imfinzi (Durvalumab) status post 21 cycles.  The patient has no complaints today.  He continues to tolerate the treatment well. I recommended for him to proceed with cycle #22 today as a scheduled.  His renal function is stable at 1.82.  Okay to proceed with Imfinzi as scheduled.  For hypertension, I strongly recommended for him to take his blood pressure medication as prescribed and to monitor it closely at home.  He was advised also to reach out to his primary care physician for adjustment of his medication if needed.  The patient will come back for follow-up visit in 2 weeks for evaluation before the next cycle of his treatment. He was advised to call immediately if he has any concerning symptoms in the interval. The patient voices understanding of current disease status and treatment options and is in agreement with the current care plan. All questions were answered. The patient knows to call the clinic with any problems, questions or concerns. We can certainly see the patient much sooner if necessary.

## 2017-10-24 NOTE — Telephone Encounter (Signed)
Pt's pharmacy is requesting a refill on levothyroxine. Would Dr. Harrington Challenger like to refill this medication? Please address

## 2017-10-24 NOTE — Progress Notes (Signed)
[facility] OFFICE PROGRESS NOTE  Lucianne Lei, MD [address] [place] [address]  DIAGNOSIS: Stage IIIA (T1a, N2, M0) non-small cell lung cancer, adenocarcinoma presented with right upper lobe lung nodule in addition to mediastinal lymphadenopathy diagnosed in March 2018.  Biomarker Findings Tumor Mutational Burden - TMB-Intermediate (8 Muts/Mb) Microsatellite Status - MS-Stable Genomic Findings For a complete list of the genes assayed, please refer to the Appendix. ERBB2 amplification - equivocal? DNMT3A K436f*192 FUBP1 Q40* KEAP1 [id number] TP53 C275F 7 Disease relevant genes with no reportable alterations: EGFR, KRAS, ALK, BRAF, MET, RET, ROS1  PRIOR THERAPY: course of concurrent chemoradiation with weekly carboplatin for AUC of 2 and paclitaxel 45 MG/M2. Status post 6 cycles. Last cycle was given 10/16/2016.  CURRENT THERAPY:  Consolidation treatment with immunotherapy with Imfinzi (Durvalumab) 10 MG/M2 every 2 weeks. First dose 12/21/2016.  Status post 21 cycles.  INTERVAL HISTORY: Jason Winokur89.o. male returns for routine follow-up visit by himself.  Patient is feeling fine today with no concerning complaints.  He continues to tolerate treatment with Imfinzi fairly well.  He denies fevers and chills.  Denies chest pain, shortness breath, cough, hemoptysis.  Denies nausea, vomiting, constipation, diarrhea.  Denies skin rashes.  The patient is here for evaluation prior to starting cycle #22 of his treatment.  MEDICAL HISTORY: Past Medical History:  Diagnosis Date  . Adenocarcinoma of right lung, stage 3 (HCRupert5/23/2018  . Adenocarcinoma of right lung, stage 3 (HCDover5/23/2018  . Anemia   . Arthritis    "hx right hip"  . Asthma    "when I was a child"  . Atrial fibrillation (HCC)    Amiodarone started 10/2011; Coumadin  . 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 St. Jude ICD implantation 10/03/12.  . Chronic  anticoagulation   . Chronic fatigue 10/18/2016  . Chronic fatigue 10/18/2016  . Chronic systolic heart failure (HCEmerson   a. Echo 7/13: EF 25%;  b. echo 04/2012:  Mild LVH, EF 30-35%, Gr 1 DD, mild AI, mild MR, mild LAE  . COPD (chronic obstructive pulmonary disease) (HCRaymond  . Dyslipidemia   . Dysrhythmia   . Encounter for antineoplastic chemotherapy 08/24/2016  . Gout   . History of blood transfusion 10/15/2013   "don't know where the blood's going; HgB down to 5"  . Hyperlipidemia   . Hypertension   . Hypothyroidism   . NICM (nonischemic cardiomyopathy) (HCAshland   LHSanta Isabel/14:  minimal CAD  . Obesity   . OSA on CPAP   . Tobacco abuse     ALLERGIES:  has No Known Allergies.  MEDICATIONS:  Current Outpatient Medications  Medication Sig Dispense Refill  . acetaminophen (TYLENOL) 500 MG tablet Take 1,000 mg by mouth every 6 (six) hours as needed for mild pain.    . Marland Kitchenlbuterol (PROAIR HFA) 108 (90 Base) MCG/ACT inhaler Inhale 2 puffs into the lungs every 4 (four) hours as needed for wheezing or shortness of breath. 1 Inhaler 2  . amiodarone (PACERONE) 100 MG tablet Take 1 tablet (100 mg total) by mouth daily. 90 tablet 3  . atorvastatin (LIPITOR) 20 MG tablet TAKE 1 TABLET BY MOUTH EVERY DAY 90 tablet 2  . carvedilol (COREG) 12.5 MG tablet TAKE 1 TABLET (12.5 MG TOTAL) BY MOUTH 2 (TWO) TIMES DAILY. 180 tablet 1  . [employer] D3 2000 units CAPS Take 2,000 Units by mouth daily.   11  . feeding supplement, ENSURE  ENLIVE, (ENSURE ENLIVE) LIQD Take 237 mLs by mouth 2 (two) times daily between meals. 237 mL 12  . ferrous gluconate (FERGON) 324 MG tablet Take 1 tablet (324 mg total) by mouth 3 (three) times daily with meals. 90 tablet 8  . Fluticasone-Umeclidin-Vilant (TRELEGY ELLIPTA) 100-62.5-25 MCG/INH AEPB Inhale 1 puff into the lungs daily. 3 each 3  . furosemide (LASIX) 20 MG tablet TAKE 3 TABLETS (60 MG) EVERY TUE AND SAT, ALL OTHER DAYS TAKE 2 TABLETS (40 MG). 64 tablet 7  . hydrALAZINE (APRESOLINE)  100 MG tablet TAKE 1 TABLET BY MOUTH 3 (THREE) TIMES DAILY. PLEASE KEEP UPCOMING APPOINTMENT FOR FURTHER REFILLS 30 tablet 1  . levothyroxine (SYNTHROID, LEVOTHROID) 50 MCG tablet TAKE 1 TABLET BY MOUTH DAILY BEFORE BREAKFAST 90 tablet 3  . lidocaine-prilocaine (EMLA) cream Apply generous amount to port site at least 1 hr prior to treatment.  DO NOT RUB IN. 30 g 1  . lisinopril (PRINIVIL,ZESTRIL) 40 MG tablet Take 1 tablet (40 mg total) by mouth daily. Please keep 09/2017 appointment for further refills 90 tablet 0  . magnesium oxide (MAG-OX) 400 (241.3 Mg) MG tablet Take 1 tablet (400 mg total) by mouth daily. 30 tablet 0  . potassium chloride SA (KLOR-CON M20) 20 MEQ tablet TAKE 1 TABLET BY MOUTH EVERY DAY *ON DAYS WHEN LASIX IS TAKEN, TAKE 2 TABLETS*  Dr. Harrington Challenger for further refills. 3rd attempt 30 tablet 0  . prochlorperazine (COMPAZINE) 10 MG tablet Take 1 tablet (10 mg total) by mouth every 6 (six) hours as needed for nausea or vomiting. 30 tablet 0  . rivaroxaban (XARELTO) 20 MG TABS tablet Take 1 tablet (20 mg total) by mouth daily. 90 tablet 2  . sildenafil (VIAGRA) 100 MG tablet Take 100 mg by mouth.    . spironolactone (ALDACTONE) 25 MG tablet Take 0.5 tablets (12.5 mg total) by mouth daily. Please keep upcoming appt with Dr. Harrington Challenger in June for future refills. Thank you 45 tablet 0  . sucralfate (CARAFATE) 1 g tablet TAKE 1 TABLET BY MOUTH FOUR TIMES DAILY (5 MINUTES BEFORE MEALS AND AT BEDTIME) FOR ESOPHAGITIS 120 tablet 0  . ULORIC 40 MG tablet Take 1 tablet (40 mg total) by mouth daily. 30 tablet 0   No current facility-administered medications for this visit.    Facility-Administered Medications Ordered in Other Visits  Medication Dose Route Frequency Provider Last Rate Last Dose  . durvalumab (IMFINZI) 1,360 mg in sodium chloride 0.9 % 100 mL chemo infusion  10.4 mg/kg (Treatment Plan Recorded) Intravenous Once Curt Bears, MD 127 mL/hr at 10/25/17 0949 1,360 mg at 10/25/17 0949  .  heparin lock flush 100 unit/mL  500 Units Intracatheter Once PRN Curt Bears, MD      . sodium chloride flush (NS) 0.9 % injection 10 mL  10 mL Intracatheter PRN Curt Bears, MD        SURGICAL HISTORY:  Past Surgical History:  Procedure Laterality Date  . CARDIAC DEFIBRILLATOR PLACEMENT  2014  . CARDIOVERSION  2011  . COLONOSCOPY WITH PROPOFOL Left 10/17/2013   Procedure: COLONOSCOPY WITH PROPOFOL;  Surgeon: Inda Castle, MD;  Location: [facility];  Service: Endoscopy;  Laterality: Left;  . ESOPHAGOGASTRODUODENOSCOPY N/A 10/17/2013   Procedure: ESOPHAGOGASTRODUODENOSCOPY (EGD);  Surgeon: Inda Castle, MD;  Location: [facility];  Service: Endoscopy;  Laterality: N/A;  . GIVENS CAPSULE STUDY N/A 10/29/2013   Procedure: GIVENS CAPSULE STUDY;  Surgeon: Inda Castle, MD;  Location: [facility];  Service: Endoscopy;  Laterality: N/A;  . IMPLANTABLE CARDIOVERTER DEFIBRILLATOR IMPLANT Left 10/03/2012   Procedure: IMPLANTABLE CARDIOVERTER DEFIBRILLATOR IMPLANT;  Surgeon: Deboraha Sprang, MD;  Location[facility] C[facility] LAB;  Service: Cardiovascular;  Laterality: Left;  . IR FLUORO GUIDE PORT INSERTION RIGHT  09/21/2016  . IR US GUIDE VASC ACCESS RIGHT  09/21/2016  . JOINT REPLACEMENT    . TONSILLECTOMY  1950's  . TOTAL HIP ARTHROPLASTY Right 11/25/1997  . VIDEO BRONCHOSCOPY WITH ENDOBRONCHIAL ULTRASOUND N/A 08/09/2016   Procedure: VIDEO BRONCHOSCOPY WITH ENDOBRONCHIAL ULTRASOUND;  Surgeon: Javier Glazier, MD;  Location: [facility] OR;  Service: Thoracic;  Laterality: N/A;    REVIEW OF SYSTEMS:   Review of Systems  Constitutional: Negative for appetite change, chills, fatigue, fever and unexpected weight change.  HENT:   Negative for mouth sores, nosebleeds, sore throat and trouble swallowing.   Eyes: Negative for eye problems and icterus.  Respiratory: Negative for cough, hemoptysis, shortness of breath and wheezing.   Cardiovascular: Negative for chest pain and leg swelling.   Gastrointestinal: Negative for abdominal pain, constipation, diarrhea, nausea and vomiting.  Genitourinary: Negative for bladder incontinence, difficulty urinating, dysuria, frequency and hematuria.   Musculoskeletal: Negative for back pain, gait problem, neck pain and neck stiffness.  Skin: Negative for itching and rash.  Neurological: Negative for dizziness, extremity weakness, gait problem, headaches, light-headedness and seizures.  Hematological: Negative for adenopathy. Does not bruise/bleed easily.  Psychiatric/Behavioral: Negative for confusion, depression and sleep disturbance. The patient is not nervous/anxious.     PHYSICAL EXAMINATION:  Blood pressure (!) 145/78, pulse 91, temperature 98.3 F (36.8 C), temperature source Oral, resp. rate 18, height _0  (1.854 m), weight 292 lb 1.6 oz (132.5 kg), SpO2 100 %.  ECOG PERFORMANCE STATUS: 1 - Symptomatic but completely ambulatory  Physical Exam  Constitutional: Oriented to person, place, and time and well-developed, well-nourished, and in no distress. No distress.  HENT:  Head: Normocephalic and atraumatic.  Mouth/Throat: Oropharynx is clear and moist. No oropharyngeal exudate.  Eyes: Conjunctivae are normal. Right eye exhibits no discharge. Left eye exhibits no discharge. No scleral icterus.  Neck: Normal range of motion. Neck supple.  Cardiovascular: Normal rate, regular rhythm, normal heart sounds and intact distal pulses.   Pulmonary/Chest: Effort normal and breath sounds normal. No respiratory distress. No wheezes. No rales.  Abdominal: Soft. Bowel sounds are normal. Exhibits no distension and no mass. There is no tenderness.  Musculoskeletal: Normal range of motion. Exhibits no edema.  Lymphadenopathy:    No cervical adenopathy.  Neurological: Alert and oriented to person, place, and time. Exhibits normal muscle tone. Gait normal. Coordination normal.  Skin: Skin is warm and dry. No rash noted. Not diaphoretic. No  erythema. No pallor.  Psychiatric: Mood, memory and judgment normal.  Vitals reviewed.  LABORATORY DATA: Lab Results  Component Value Date   WBC 4.7 10/25/2017   HGB 12.2 (L) 10/25/2017   HCT 37.8 (L) 10/25/2017   MCV 93.3 10/25/2017   PLT 146 10/25/2017      Chemistry      Component Value Date/Time   NA 143 10/25/2017 0747   NA 141 03/29/2017 1038   K 3.6 10/25/2017 0747   K 4.0 03/29/2017 1038   CL 105 10/25/2017 0747   CO2 29 10/25/2017 0747   CO2 27 03/29/2017 1038   BUN 27 (H) 10/25/2017 0747   BUN 28.6 (H) 03/29/2017 1038   CREATININE 1.82 (H) 10/25/2017 0747   CREATININE 1.6 (H) 03/29/2017 1038      Component  Value Date/Time   CALCIUM 9.3 10/25/2017 0747   CALCIUM 9.3 03/29/2017 1038   ALKPHOS 121 10/25/2017 0747   ALKPHOS 105 03/29/2017 1038   AST 14 (L) 10/25/2017 0747   AST 18 03/29/2017 1038   ALT 11 10/25/2017 0747   ALT 13 03/29/2017 1038   BILITOT 0.3 10/25/2017 0747   BILITOT 0.62 03/29/2017 1038       RADIOGRAPHIC STUDIES:  No results found.   ASSESSMENT/PLAN:  Primary cancer of right upper lobe of lung ([place]) This is a very pleasant 67 year old African-American male with a stage IIIa non-small cell lung cancer, adenocarcinoma.  The patient completed 6 weeks of concurrent chemoradiation with weekly carboplatin and paclitaxel and tolerated his treatment well except for odynophagia. The patient is currently on consolidation treatment with immunotherapy with Imfinzi (Durvalumab) status post 21 cycles.  The patient has no complaints today.  He continues to tolerate the treatment well. I recommended for him to proceed with cycle #22 today as a scheduled.  His renal function is stable at 1.82.  Okay to proceed with Imfinzi as scheduled.  For hypertension, I strongly recommended for him to take his blood pressure medication as prescribed and to monitor it closely at home.  He was advised also to reach out to his primary care physician for adjustment of  his medication if needed.  The patient will come back for follow-up visit in 2 weeks for evaluation before the next cycle of his treatment. He was advised to call immediately if he has any concerning symptoms in the interval. The patient voices understanding of current disease status and treatment options and is in agreement with the current care plan. All questions were answered. The patient knows to call the clinic with any problems, questions or concerns. We can certainly see the patient much sooner if necessary.   Orders Placed This Encounter  Procedures  . CBC with Differential (Cancer Center Only)    Standing Status:   Standing    Number of Occurrences:   20    Standing Expiration Date:   10/26/2018  . CMP ([facility] only)    Standing Status:   Standing    Number of Occurrences:   20    Standing Expiration Date:   10/26/2018   Mikey Bussing, DNP, AGPCNP-BC, AOCNP 10/25/17

## 2017-10-25 ENCOUNTER — Inpatient Hospital Stay (HOSPITAL_BASED_OUTPATIENT_CLINIC_OR_DEPARTMENT_OTHER): Payer: Medicare Other | Admitting: Oncology

## 2017-10-25 ENCOUNTER — Telehealth: Payer: Self-pay | Admitting: Oncology

## 2017-10-25 ENCOUNTER — Inpatient Hospital Stay: Payer: Medicare Other

## 2017-10-25 ENCOUNTER — Encounter: Payer: Self-pay | Admitting: Oncology

## 2017-10-25 VITALS — BP 122/73

## 2017-10-25 VITALS — BP 145/78 | HR 91 | Temp 98.3°F | Resp 18 | Ht 73.0 in | Wt 292.1 lb

## 2017-10-25 DIAGNOSIS — I110 Hypertensive heart disease with heart failure: Secondary | ICD-10-CM | POA: Diagnosis not present

## 2017-10-25 DIAGNOSIS — C3411 Malignant neoplasm of upper lobe, right bronchus or lung: Secondary | ICD-10-CM

## 2017-10-25 DIAGNOSIS — I4891 Unspecified atrial fibrillation: Secondary | ICD-10-CM

## 2017-10-25 DIAGNOSIS — R590 Localized enlarged lymph nodes: Secondary | ICD-10-CM

## 2017-10-25 DIAGNOSIS — Z7901 Long term (current) use of anticoagulants: Secondary | ICD-10-CM

## 2017-10-25 DIAGNOSIS — Z9989 Dependence on other enabling machines and devices: Secondary | ICD-10-CM

## 2017-10-25 DIAGNOSIS — Z9221 Personal history of antineoplastic chemotherapy: Secondary | ICD-10-CM

## 2017-10-25 DIAGNOSIS — J449 Chronic obstructive pulmonary disease, unspecified: Secondary | ICD-10-CM | POA: Diagnosis not present

## 2017-10-25 DIAGNOSIS — Z79899 Other long term (current) drug therapy: Secondary | ICD-10-CM

## 2017-10-25 DIAGNOSIS — I5022 Chronic systolic (congestive) heart failure: Secondary | ICD-10-CM | POA: Diagnosis not present

## 2017-10-25 DIAGNOSIS — Z923 Personal history of irradiation: Secondary | ICD-10-CM | POA: Diagnosis not present

## 2017-10-25 DIAGNOSIS — E039 Hypothyroidism, unspecified: Secondary | ICD-10-CM

## 2017-10-25 DIAGNOSIS — E785 Hyperlipidemia, unspecified: Secondary | ICD-10-CM

## 2017-10-25 DIAGNOSIS — Z5112 Encounter for antineoplastic immunotherapy: Secondary | ICD-10-CM

## 2017-10-25 DIAGNOSIS — G4733 Obstructive sleep apnea (adult) (pediatric): Secondary | ICD-10-CM

## 2017-10-25 DIAGNOSIS — M1611 Unilateral primary osteoarthritis, right hip: Secondary | ICD-10-CM

## 2017-10-25 LAB — COMPREHENSIVE METABOLIC PANEL
ALT: 11 U/L (ref 0–44)
AST: 14 U/L — ABNORMAL LOW (ref 15–41)
Albumin: 3.5 g/dL (ref 3.5–5.0)
Alkaline Phosphatase: 121 U/L (ref 38–126)
Anion gap: 9 (ref 5–15)
BUN: 27 mg/dL — ABNORMAL HIGH (ref 8–23)
CO2: 29 mmol/L (ref 22–32)
CREATININE: 1.82 mg/dL — AB (ref 0.61–1.24)
Calcium: 9.3 mg/dL (ref 8.9–10.3)
Chloride: 105 mmol/L (ref 98–111)
GFR, EST AFRICAN AMERICAN: 43 mL/min — AB (ref >60)
GFR, EST NON AFRICAN AMERICAN: 37 mL/min — AB (ref >60)
Glucose, Bld: 150 mg/dL — ABNORMAL HIGH (ref 70–99)
POTASSIUM: 3.6 mmol/L (ref 3.5–5.1)
Sodium: 143 mmol/L (ref 135–145)
TOTAL PROTEIN: 6.5 g/dL (ref 6.5–8.1)
Total Bilirubin: 0.3 mg/dL (ref 0.3–1.2)

## 2017-10-25 LAB — CBC WITH DIFFERENTIAL/PLATELET
Basophils Absolute: 0.1 10*3/uL (ref 0.0–0.1)
Basophils Relative: 2 %
EOS PCT: 2 %
Eosinophils Absolute: 0.1 10*3/uL (ref 0.0–0.5)
HCT: 37.8 % — ABNORMAL LOW (ref 38.4–49.9)
Hemoglobin: 12.2 g/dL — ABNORMAL LOW (ref 13.0–17.1)
LYMPHS ABS: 0.8 10*3/uL — AB (ref 0.9–3.3)
LYMPHS PCT: 16 %
MCH: 30.1 pg (ref 27.2–33.4)
MCHC: 32.3 g/dL (ref 32.0–36.0)
MCV: 93.3 fL (ref 79.3–98.0)
MONO ABS: 0.5 10*3/uL (ref 0.1–0.9)
MONOS PCT: 11 %
Neutro Abs: 3.2 10*3/uL (ref 1.5–6.5)
Neutrophils Relative %: 69 %
Platelets: 146 10*3/uL (ref 140–400)
RBC: 4.05 MIL/uL — ABNORMAL LOW (ref 4.20–5.82)
RDW: 14.8 % — AB (ref 11.0–14.6)
WBC: 4.7 10*3/uL (ref 4.0–10.3)

## 2017-10-25 MED ORDER — SODIUM CHLORIDE 0.9 % IV SOLN
Freq: Once | INTRAVENOUS | Status: AC
  Administered 2017-10-25: 09:00:00 via INTRAVENOUS
  Filled 2017-10-25: qty 250

## 2017-10-25 MED ORDER — HEPARIN SOD (PORK) LOCK FLUSH 100 UNIT/ML IV SOLN
500.0000 [IU] | Freq: Once | INTRAVENOUS | Status: AC | PRN
  Administered 2017-10-25: 500 [IU]
  Filled 2017-10-25: qty 5

## 2017-10-25 MED ORDER — SODIUM CHLORIDE 0.9% FLUSH
10.0000 mL | INTRAVENOUS | Status: DC | PRN
  Administered 2017-10-25: 10 mL
  Filled 2017-10-25: qty 10

## 2017-10-25 MED ORDER — SODIUM CHLORIDE 0.9 % IV SOLN
10.4000 mg/kg | Freq: Once | INTRAVENOUS | Status: AC
  Administered 2017-10-25: 1360 mg via INTRAVENOUS
  Filled 2017-10-25: qty 7.2

## 2017-10-25 NOTE — Telephone Encounter (Signed)
Scheduled appt per 7/25 los - pt to get an updated schedule next visit.

## 2017-10-25 NOTE — Patient Instructions (Signed)
[facility] Discharge Instructions for Patients Receiving Chemotherapy  Today you received the following chemotherapy agents: Imfinzi.  To help prevent nausea and vomiting after your treatment, we encourage you to take your nausea medication as directed.   If you develop nausea and vomiting that is not controlled by your nausea medication, call the clinic.   BELOW ARE SYMPTOMS THAT SHOULD BE REPORTED IMMEDIATELY:  *FEVER GREATER THAN 100.5 F  *CHILLS WITH OR WITHOUT FEVER  NAUSEA AND VOMITING THAT IS NOT CONTROLLED WITH YOUR NAUSEA MEDICATION  *UNUSUAL SHORTNESS OF BREATH  *UNUSUAL BRUISING OR BLEEDING  TENDERNESS IN MOUTH AND THROAT WITH OR WITHOUT PRESENCE OF ULCERS  *URINARY PROBLEMS  *BOWEL PROBLEMS  UNUSUAL RASH Items with * indicate a potential emergency and should be followed up as soon as possible.  Feel free to call the clinic should you have any questions or concerns. The clinic phone number is [phone].  Please show the CHEMO ALERT CARD at check-in to the Emergency Department and triage nurse.   

## 2017-11-01 ENCOUNTER — Other Ambulatory Visit: Payer: Self-pay | Admitting: Internal Medicine

## 2017-11-01 MED ORDER — HYDRALAZINE HCL 100 MG PO TABS: ORAL_TABLET | ORAL | 3 refills | Status: DC

## 2017-11-08 ENCOUNTER — Telehealth: Payer: Self-pay | Admitting: Internal Medicine

## 2017-11-08 ENCOUNTER — Inpatient Hospital Stay: Payer: Medicare Other | Attending: Internal Medicine

## 2017-11-08 ENCOUNTER — Inpatient Hospital Stay: Payer: Medicare Other

## 2017-11-08 ENCOUNTER — Encounter: Payer: Self-pay | Admitting: Internal Medicine

## 2017-11-08 ENCOUNTER — Inpatient Hospital Stay (HOSPITAL_BASED_OUTPATIENT_CLINIC_OR_DEPARTMENT_OTHER): Payer: Medicare Other | Admitting: Internal Medicine

## 2017-11-08 VITALS — BP 152/82 | HR 75 | Temp 98.3°F | Resp 20 | Ht 73.0 in | Wt 294.8 lb

## 2017-11-08 DIAGNOSIS — I4891 Unspecified atrial fibrillation: Secondary | ICD-10-CM | POA: Diagnosis not present

## 2017-11-08 DIAGNOSIS — I110 Hypertensive heart disease with heart failure: Secondary | ICD-10-CM

## 2017-11-08 DIAGNOSIS — E039 Hypothyroidism, unspecified: Secondary | ICD-10-CM | POA: Diagnosis not present

## 2017-11-08 DIAGNOSIS — E785 Hyperlipidemia, unspecified: Secondary | ICD-10-CM | POA: Diagnosis not present

## 2017-11-08 DIAGNOSIS — J449 Chronic obstructive pulmonary disease, unspecified: Secondary | ICD-10-CM | POA: Insufficient documentation

## 2017-11-08 DIAGNOSIS — Z7901 Long term (current) use of anticoagulants: Secondary | ICD-10-CM | POA: Diagnosis not present

## 2017-11-08 DIAGNOSIS — Z9989 Dependence on other enabling machines and devices: Secondary | ICD-10-CM

## 2017-11-08 DIAGNOSIS — Z5112 Encounter for antineoplastic immunotherapy: Secondary | ICD-10-CM

## 2017-11-08 DIAGNOSIS — I5022 Chronic systolic (congestive) heart failure: Secondary | ICD-10-CM

## 2017-11-08 DIAGNOSIS — C3411 Malignant neoplasm of upper lobe, right bronchus or lung: Secondary | ICD-10-CM

## 2017-11-08 DIAGNOSIS — Z79899 Other long term (current) drug therapy: Secondary | ICD-10-CM

## 2017-11-08 DIAGNOSIS — M1990 Unspecified osteoarthritis, unspecified site: Secondary | ICD-10-CM | POA: Diagnosis not present

## 2017-11-08 DIAGNOSIS — G4733 Obstructive sleep apnea (adult) (pediatric): Secondary | ICD-10-CM | POA: Insufficient documentation

## 2017-11-08 LAB — CMP (CANCER CENTER ONLY)
ALBUMIN: 3.3 g/dL — AB (ref 3.5–5.0)
ALT: 13 U/L (ref 0–44)
AST: 17 U/L (ref 15–41)
Alkaline Phosphatase: 105 U/L (ref 38–126)
Anion gap: 9 (ref 5–15)
BUN: 24 mg/dL — AB (ref 8–23)
CHLORIDE: 108 mmol/L (ref 98–111)
CO2: 24 mmol/L (ref 22–32)
CREATININE: 1.48 mg/dL — AB (ref 0.61–1.24)
Calcium: 8.9 mg/dL (ref 8.9–10.3)
GFR, Est AFR Am: 55 mL/min — ABNORMAL LOW (ref >60)
GFR, Estimated: 48 mL/min — ABNORMAL LOW (ref >60)
Glucose, Bld: 105 mg/dL — ABNORMAL HIGH (ref 70–99)
POTASSIUM: 3.5 mmol/L (ref 3.5–5.1)
Sodium: 141 mmol/L (ref 135–145)
Total Bilirubin: 0.4 mg/dL (ref 0.3–1.2)
Total Protein: 6.3 g/dL — ABNORMAL LOW (ref 6.5–8.1)

## 2017-11-08 LAB — CBC WITH DIFFERENTIAL (CANCER CENTER ONLY)
BASOS PCT: 1 %
Basophils Absolute: 0 10*3/uL (ref 0.0–0.1)
Eosinophils Absolute: 0.1 10*3/uL (ref 0.0–0.5)
Eosinophils Relative: 2 %
HEMATOCRIT: 35.7 % — AB (ref 38.4–49.9)
Hemoglobin: 11.7 g/dL — ABNORMAL LOW (ref 13.0–17.1)
LYMPHS ABS: 0.6 10*3/uL — AB (ref 0.9–3.3)
Lymphocytes Relative: 14 %
MCH: 30.1 pg (ref 27.2–33.4)
MCHC: 32.8 g/dL (ref 32.0–36.0)
MCV: 91.8 fL (ref 79.3–98.0)
MONO ABS: 0.7 10*3/uL (ref 0.1–0.9)
MONOS PCT: 17 %
Neutro Abs: 2.8 10*3/uL (ref 1.5–6.5)
Neutrophils Relative %: 66 %
Platelet Count: 139 10*3/uL — ABNORMAL LOW (ref 140–400)
RBC: 3.89 MIL/uL — ABNORMAL LOW (ref 4.20–5.82)
RDW: 14.8 % — AB (ref 11.0–14.6)
WBC Count: 4.2 10*3/uL (ref 4.0–10.3)

## 2017-11-08 LAB — TSH: TSH: 2.901 u[IU]/mL (ref 0.320–4.118)

## 2017-11-08 MED ORDER — HEPARIN SOD (PORK) LOCK FLUSH 100 UNIT/ML IV SOLN
500.0000 [IU] | Freq: Once | INTRAVENOUS | Status: AC | PRN
Start: 2017-11-08 — End: 2017-11-08
  Administered 2017-11-08: 500 [IU]
  Filled 2017-11-08: qty 5

## 2017-11-08 MED ORDER — DURVALUMAB 500 MG/10ML IV SOLN: 10.0000 mg/kg | Freq: Once | INTRAVENOUS | Status: DC

## 2017-11-08 MED ORDER — SODIUM CHLORIDE 0.9 % IV SOLN
Freq: Once | INTRAVENOUS | Status: AC
  Administered 2017-11-08: 10:00:00 via INTRAVENOUS
  Filled 2017-11-08: qty 250

## 2017-11-08 MED ORDER — SODIUM CHLORIDE 0.9 % IV SOLN
10.4000 mg/kg | Freq: Once | INTRAVENOUS | Status: AC
  Administered 2017-11-08: 1360 mg via INTRAVENOUS
  Filled 2017-11-08: qty 7.2

## 2017-11-08 MED ORDER — SODIUM CHLORIDE 0.9% FLUSH
10.0000 mL | INTRAVENOUS | Status: DC | PRN
  Administered 2017-11-08: 10 mL
  Filled 2017-11-08: qty 10

## 2017-11-08 NOTE — Patient Instructions (Signed)
[facility] Discharge Instructions for Patients Receiving Chemotherapy  Today you received the following chemotherapy agents imfinzi.  To help prevent nausea and vomiting after your treatment, we encourage you to take your nausea medication as directed.   If you develop nausea and vomiting that is not controlled by your nausea medication, call the clinic.   BELOW ARE SYMPTOMS THAT SHOULD BE REPORTED IMMEDIATELY:  *FEVER GREATER THAN 100.5 F  *CHILLS WITH OR WITHOUT FEVER  NAUSEA AND VOMITING THAT IS NOT CONTROLLED WITH YOUR NAUSEA MEDICATION  *UNUSUAL SHORTNESS OF BREATH  *UNUSUAL BRUISING OR BLEEDING  TENDERNESS IN MOUTH AND THROAT WITH OR WITHOUT PRESENCE OF ULCERS  *URINARY PROBLEMS  *BOWEL PROBLEMS  UNUSUAL RASH Items with * indicate a potential emergency and should be followed up as soon as possible.  Feel free to call the clinic should you have any questions or concerns. The clinic phone number is [phone].  Please show the [place] at check-in to the Emergency Department and triage nurse.

## 2017-11-08 NOTE — Telephone Encounter (Signed)
3 cycles already scheduled per 8/8 los. - no additional appts added.

## 2017-11-08 NOTE — Progress Notes (Signed)
[place] [phone]   [phone]  OFFICE PROGRESS NOTE  Lucianne Lei, MD [address] [place] [address]  DIAGNOSIS: Stage IIIA (T1a, N2, M0) non-small cell lung cancer, adenocarcinoma presented with right upper lobe lung nodule in addition to mediastinal lymphadenopathy diagnosed in March 2018.  Biomarker Findings Tumor Mutational Burden - TMB-Intermediate (8 Muts/Mb) Microsatellite Status - MS-Stable Genomic Findings For a complete list of the genes assayed, please refer to the Appendix. ERBB2 amplification - equivocal? DNMT3A K456f*192 FUBP1 Q40* KEAP1 [id number] TP53 C275F 7 Disease relevant genes with no reportable alterations: EGFR, KRAS, ALK, BRAF, MET, RET, ROS1   PRIOR THERAPY: course of concurrent chemoradiation with weekly carboplatin for AUC of 2 and paclitaxel 45 MG/M2. Status post 6 cycles. Last cycle was given 10/16/2016.  CURRENT THERAPY:  Consolidation treatment with immunotherapy with Imfinzi (Durvalumab) 10 MG/M2 every 2 weeks. First dose 12/21/2016.  Status post 22 cycles.  INTERVAL HISTORY: Jason Starliper89.o. male returns to the clinic today for follow-up visit.  The patient is currently undergoing treatment with Imfinzi (Durvalumab) every 2 weeks and has been tolerating it fairly well.  He denied having any chest pain, shortness of breath, cough or hemoptysis.  He denied having any fever or chills.  He has no nausea, vomiting, diarrhea or constipation.  He has no recent weight loss or night sweats.  He is here today for evaluation before starting cycle #23.  MEDICAL HISTORY: Past Medical History:  Diagnosis Date  . Adenocarcinoma of right lung, stage 3 ([facility]2018  . Adenocarcinoma of right lung, stage 3 ([facility]5/23/2018  . Anemia   . Arthritis    "hx right hip"  . Asthma    "when I was a child"  . Atrial fibrillation (HCC)    Amiodarone started 10/2011; Coumadin  . Automatic implantable  cardioverter-defibrillator in situ 10/03/2012   a. St. Jude ICD implantation 10/03/12.  . Chronic anticoagulation   . Chronic fatigue 10/18/2016  . Chronic fatigue 10/18/2016  . Chronic systolic heart failure ([facility]   a. Echo 7/13: EF 25%;  b. echo 04/2012:  Mild LVH, EF 30-35%, Gr 1 DD, mild AI, mild MR, mild LAE  . COPD (chronic obstructive pulmonary disease) (HCL[facility]  . Dyslipidemia   . Dysrhythmia   . Encounter for antineoplastic chemotherapy 08/24/2016  . Gout   . History of blood transfusion 10/15/2013   "don't know where the blood's going; HgB down to 5"  . Hyperlipidemia   . Hypertension   . Hypothyroidism   . NICM (nonischemic cardiomyopathy) ([facility]   [facility]/14:  minimal CAD  . Obesity   . OSA on CPAP   . Tobacco abuse     ALLERGIES:  has No Known Allergies.  MEDICATIONS:  Current Outpatient Medications  Medication Sig Dispense Refill  . acetaminophen (TYLENOL) 500 MG tablet Take 1,000 mg by mouth every 6 (six) hours as needed for mild pain.    . Marland Kitchenlbuterol (PROAIR HFA) 108 (90 Base) MCG/ACT inhaler Inhale 2 puffs into the lungs every 4 (four) hours as needed for wheezing or shortness of breath. 1 Inhaler 2  . amiodarone (PACERONE) 100 MG tablet Take 1 tablet (100 mg total) by mouth daily. 90 tablet 3  . atorvastatin (LIPITOR) 20 MG tablet TAKE 1 TABLET BY MOUTH EVERY DAY 90 tablet 2  . carvedilol (COREG) 12.5 MG tablet TAKE 1 TABLET (12.5 MG TOTAL) BY MOUTH 2 (TWO) TIMES  DAILY. 180 tablet 1  . [employer] D3 2000 units CAPS Take 2,000 Units by mouth daily.   11  . feeding supplement, ENSURE ENLIVE, (ENSURE ENLIVE) LIQD Take 237 mLs by mouth 2 (two) times daily between meals. 237 mL 12  . ferrous gluconate (FERGON) 324 MG tablet Take 1 tablet (324 mg total) by mouth 3 (three) times daily with meals. 90 tablet 8  . Fluticasone-Umeclidin-Vilant (TRELEGY ELLIPTA) 100-62.5-25 MCG/INH AEPB Inhale 1 puff into the lungs daily. 3 each 3  . furosemide (LASIX) 20 MG tablet TAKE 3 TABLETS (60 MG)  EVERY TUE AND SAT, ALL OTHER DAYS TAKE 2 TABLETS (40 MG). 64 tablet 7  . hydrALAZINE (APRESOLINE) 100 MG tablet TAKE 1 TABLET BY MOUTH 3 (THREE) TIMES DAILY. 270 tablet 3  . levothyroxine (SYNTHROID, LEVOTHROID) 50 MCG tablet TAKE 1 TABLET BY MOUTH DAILY BEFORE BREAKFAST 90 tablet 3  . lidocaine-prilocaine (EMLA) cream Apply generous amount to port site at least 1 hr prior to treatment.  DO NOT RUB IN. 30 g 1  . lisinopril (PRINIVIL,ZESTRIL) 40 MG tablet Take 1 tablet (40 mg total) by mouth daily. Please keep 09/2017 appointment for further refills 90 tablet 0  . magnesium oxide (MAG-OX) 400 (241.3 Mg) MG tablet Take 1 tablet (400 mg total) by mouth daily. 30 tablet 0  . potassium chloride SA (KLOR-CON M20) 20 MEQ tablet TAKE 1 TABLET BY MOUTH EVERY DAY *ON DAYS WHEN LASIX IS TAKEN, TAKE 2 TABLETS*  Dr. Harrington Challenger for further refills. 3rd attempt 30 tablet 0  . prochlorperazine (COMPAZINE) 10 MG tablet Take 1 tablet (10 mg total) by mouth every 6 (six) hours as needed for nausea or vomiting. 30 tablet 0  . rivaroxaban (XARELTO) 20 MG TABS tablet Take 1 tablet (20 mg total) by mouth daily. 90 tablet 2  . sildenafil (VIAGRA) 100 MG tablet Take 100 mg by mouth.    . spironolactone (ALDACTONE) 25 MG tablet Take 0.5 tablets (12.5 mg total) by mouth daily. Please keep upcoming appt with Dr. Harrington Challenger in June for future refills. Thank you 45 tablet 0  . sucralfate (CARAFATE) 1 g tablet TAKE 1 TABLET BY MOUTH FOUR TIMES DAILY (5 MINUTES BEFORE MEALS AND AT BEDTIME) FOR ESOPHAGITIS 120 tablet 0  . ULORIC 40 MG tablet Take 1 tablet (40 mg total) by mouth daily. 30 tablet 0   No current facility-administered medications for this visit.     SURGICAL HISTORY:  Past Surgical History:  Procedure Laterality Date  . CARDIAC DEFIBRILLATOR PLACEMENT  2014  . CARDIOVERSION  2011  . COLONOSCOPY WITH PROPOFOL Left 10/17/2013   Procedure: COLONOSCOPY WITH PROPOFOL;  Surgeon: Inda Castle, MD;  Location: [facility];   Service: Endoscopy;  Laterality: Left;  . ESOPHAGOGASTRODUODENOSCOPY N/A 10/17/2013   Procedure: ESOPHAGOGASTRODUODENOSCOPY (EGD);  Surgeon: Inda Castle, MD;  Location: [facility];  Service: Endoscopy;  Laterality: N/A;  . GIVENS CAPSULE STUDY N/A 10/29/2013   Procedure: GIVENS CAPSULE STUDY;  Surgeon: Inda Castle, MD;  Location: [facility];  Service: Endoscopy;  Laterality: N/A;  . IMPLANTABLE CARDIOVERTER DEFIBRILLATOR IMPLANT Left 10/03/2012   Procedure: IMPLANTABLE CARDIOVERTER DEFIBRILLATOR IMPLANT;  Surgeon: Deboraha Sprang, MD;  Location: [facility] CATH LAB;  Service: Cardiovascular;  Laterality: Left;  . IR FLUORO GUIDE PORT INSERTION RIGHT  09/21/2016  . IR US GUIDE VASC ACCESS RIGHT  09/21/2016  . JOINT REPLACEMENT    . TONSILLECTOMY  1950's  . TOTAL HIP ARTHROPLASTY Right 11/25/1997  . VIDEO BRONCHOSCOPY WITH ENDOBRONCHIAL ULTRASOUND  N/A 08/09/2016   Procedure: VIDEO BRONCHOSCOPY WITH ENDOBRONCHIAL ULTRASOUND;  Surgeon: Javier Glazier, MD;  Location: [facility] OR;  Service: Thoracic;  Laterality: N/A;    REVIEW OF SYSTEMS:  A comprehensive review of systems was negative.   PHYSICAL EXAMINATION: General appearance: alert, cooperative and no distress Head: Normocephalic, without obvious abnormality, atraumatic Neck: no adenopathy, no JVD, supple, symmetrical, trachea midline and thyroid not enlarged, symmetric, no tenderness/mass/nodules Lymph nodes: Cervical, supraclavicular, and axillary nodes normal. Resp: clear to auscultation bilaterally Back: symmetric, no curvature. ROM normal. No CVA tenderness. Cardio: regular rate and rhythm, S1, S2 normal, no murmur, click, rub or gallop GI: soft, non-tender; bowel sounds normal; no masses,  no organomegaly Extremities: extremities normal, atraumatic, no cyanosis or edema  ECOG PERFORMANCE STATUS: 1 - Symptomatic but completely ambulatory  Blood pressure (!) 152/82, pulse 75, temperature 98.3 F (36.8 C), temperature source Oral, resp. rate  20, height '6\' 1"'  (1.854 m), weight 294 lb 12.8 oz (133.7 kg), SpO2 99 %.  LABORATORY DATA: Lab Results  Component Value Date   WBC 4.2 11/08/2017   HGB 11.7 (L) 11/08/2017   HCT 35.7 (L) 11/08/2017   MCV 91.8 11/08/2017   PLT 139 (L) 11/08/2017      Chemistry      Component Value Date/Time   NA 141 11/08/2017 0854   NA 141 03/29/2017 1038   K 3.5 11/08/2017 0854   K 4.0 03/29/2017 1038   CL 108 11/08/2017 0854   CO2 24 11/08/2017 0854   CO2 27 03/29/2017 1038   BUN 24 (H) 11/08/2017 0854   BUN 28.6 (H) 03/29/2017 1038   CREATININE 1.48 (H) 11/08/2017 0854   CREATININE 1.6 (H) 03/29/2017 1038      Component Value Date/Time   CALCIUM 8.9 11/08/2017 0854   CALCIUM 9.3 03/29/2017 1038   ALKPHOS 105 11/08/2017 0854   ALKPHOS 105 03/29/2017 1038   AST 17 11/08/2017 0854   AST 18 03/29/2017 1038   ALT 13 11/08/2017 0854   ALT 13 03/29/2017 1038   BILITOT 0.4 11/08/2017 0854   BILITOT 0.62 03/29/2017 1038       RADIOGRAPHIC STUDIES: No results found.  ASSESSMENT AND PLAN:  This is a very pleasant 67 years old African-American male with stage IIIA non-small cell lung cancer, adenocarcinoma.  The patient completed 6 weeks of concurrent chemoradiation with weekly carboplatin and paclitaxel and tolerated his treatment well except for odynophagia. The patient is currently on consolidation treatment with immunotherapy with Imfinzi (Durvalumab) status post 22 cycles.  The patient tolerated the last cycle of his treatment well. I recommended for him to proceed with cycle #23 today as scheduled. I will see him back for follow-up visit in 2 weeks for evaluation before starting cycle #24. He was advised to call immediately if he has any concerning symptoms in the interval. The patient voices understanding of current disease status and treatment options and is in agreement with the current care plan. All questions were answered. The patient knows to call the clinic with any  problems, questions or concerns. We can certainly see the patient much sooner if necessary.  Disclaimer: This note was dictated with voice recognition software. Similar sounding words can inadvertently be transcribed and may not be corrected upon review.

## 2017-11-14 ENCOUNTER — Other Ambulatory Visit: Payer: Self-pay | Admitting: Radiation Oncology

## 2017-11-14 DIAGNOSIS — K209 Esophagitis, unspecified without bleeding: Secondary | ICD-10-CM

## 2017-11-14 DIAGNOSIS — C3411 Malignant neoplasm of upper lobe, right bronchus or lung: Secondary | ICD-10-CM

## 2017-11-15 ENCOUNTER — Telehealth: Payer: Self-pay | Admitting: Cardiology

## 2017-11-15 ENCOUNTER — Ambulatory Visit (INDEPENDENT_AMBULATORY_CARE_PROVIDER_SITE_OTHER): Payer: Medicare Other

## 2017-11-15 DIAGNOSIS — Z95810 Presence of automatic (implantable) cardiac defibrillator: Secondary | ICD-10-CM | POA: Diagnosis not present

## 2017-11-15 DIAGNOSIS — I5022 Chronic systolic (congestive) heart failure: Secondary | ICD-10-CM

## 2017-11-15 NOTE — Telephone Encounter (Signed)
LMOVM reminding pt to send remote transmission.   

## 2017-11-16 DIAGNOSIS — C3490 Malignant neoplasm of unspecified part of unspecified bronchus or lung: Secondary | ICD-10-CM | POA: Diagnosis not present

## 2017-11-16 DIAGNOSIS — I130 Hypertensive heart and chronic kidney disease with heart failure and stage 1 through stage 4 chronic kidney disease, or unspecified chronic kidney disease: Secondary | ICD-10-CM | POA: Diagnosis not present

## 2017-11-16 DIAGNOSIS — E02 Subclinical iodine-deficiency hypothyroidism: Secondary | ICD-10-CM | POA: Diagnosis not present

## 2017-11-16 NOTE — Progress Notes (Signed)
EPIC Encounter for ICM Monitoring  Patient Name: Jason Russell is a 67 y.o. male Date: 11/16/2017 Primary Care Physican: Lucianne Lei, MD Primary Cardiologist:Ross Electrophysiologist: Faustino Congress Weight: 284lbs       Heart Failure questions reviewed, pt asymptomatic.   Thoracic impedance normal.  Prescribed dosage: Furosemide 20 mg 3 tablets (60 mg total) every Tuesday and Saturday and 40 mg all the other days. Potassium 20 mEq 1 tablet every day and on days when lasix is taken, take 2 tablets  Labs: 11/08/2017 Creatinine 1.48, BUN 24, Potassium 3.5, Sodium 141, EGFR 48-55 10/25/2017 Creatinine 1.82, BUN 27, Potassium 3.6, Sodium 143, EGFR 37-43  10/11/2017 Creatinine 1.64, BUN 25, Potassium 4.2, Sodium 143, EGFR 42-49  09/27/2017 Creatinine 1.65, BUN 27, Potassium 3.6, Sodium 144, EGFR 42-49  09/13/2017 Creatinine 1.58, BUN 32, Potassium 3.9, Sodium 140, EGFR 44-51  08/30/2017 Creatinine 1.76, BUN 26, Potassium 3.9, Sodium 142, EGFR 39-45 08/16/2017 Creatinine 1.52, BUN 23, Potassium 4.0, Sodium 142, EGFR 46-54 08/02/2017 Creatinine 1.52, BUN 28, Potassium 3.7, Sodium 143, EGFR 46-54 07/19/2017 Creatinine 1.49, BUN 24, Potassium 4.0, Sodium 143, EGFR 48-55 07/05/2017 Creatinine1.65, BUN25, Potassium3.7, Sodium143, [id number] A complete set of results can be found in results review.  Recommendations: No changes.  Encouraged to call for fluid symptoms.  Follow-up plan: ICM clinic phone appointment on 01/03/2018.   Office appointment scheduled 11/28/2017 with Dr. Caryl Comes.    Copy of ICM check sent to Dr. Caryl Comes.   3 month ICM trend: 11/15/2017            1 Year ICM trend:       [name], RN 11/16/2017 9:03 AM

## 2017-11-21 ENCOUNTER — Other Ambulatory Visit: Payer: Self-pay | Admitting: Internal Medicine

## 2017-11-21 NOTE — Assessment & Plan Note (Addendum)
This is a very pleasant 67 year old African-American male with stage IIIA non-small cell lung cancer, adenocarcinoma.  The patient completed 6 weeks of concurrent chemoradiation with weekly carboplatin and paclitaxel and tolerated his treatment well except for odynophagia. The patient is currently on consolidation treatment with immunotherapy with Imfinzi (Durvalumab) status post 23 cycles.  The patient tolerated the last cycle of his treatment well. I recommended for him to proceed with cycle #24 today as scheduled. I will see him back for follow-up visit in 2 weeks for evaluation before starting cycle #25.  For the itching on the right forearm, he was advised to try antifungal cream which he can obtain over-the-counter.  He was advised to call immediately if he has any concerning symptoms in the interval. The patient voices understanding of current disease status and treatment options and is in agreement with the current care plan. All questions were answered. The patient knows to call the clinic with any problems, questions or concerns. We can certainly see the patient much sooner if necessary.

## 2017-11-21 NOTE — Progress Notes (Signed)
[facility] OFFICE PROGRESS NOTE  Lucianne Lei, MD [address] [place] [address]  DIAGNOSIS: Stage IIIA (T1a, N2, M0) non-small cell lung cancer, adenocarcinoma presented with right upper lobe lung nodule in addition to mediastinal lymphadenopathy diagnosed in March 2018.  Biomarker Findings Tumor Mutational Burden - TMB-Intermediate (8 Muts/Mb) Microsatellite Status - MS-Stable Genomic Findings For a complete list of the genes assayed, please refer to the Appendix. ERBB2 amplification - equivocal? DNMT3A K42f*192 FUBP1 Q40* KEAP1 [id number] TP53 C275F 7 Disease relevant genes with no reportable alterations: EGFR, KRAS, ALK, BRAF, MET, RET, ROS1   PRIOR THERAPY: course of concurrent chemoradiation with weekly carboplatin for AUC of 2 and paclitaxel 45 MG/M2. Status post 6 cycles. Last cycle was given 10/16/2016.  CURRENT THERAPY:  Consolidation treatment with immunotherapy with Imfinzi (Durvalumab) 10 MG/M2 every 2 weeks. First dose 12/21/2016.  Status post 23 cycles.  INTERVAL HISTORY: Jason Hunzeker89.o. male returns for routine follow-up visit by himself.  The patient is feeling fine today and has no specific complaints except for itching to his right arm.  This is been going on for several weeks.  He has been using hydrocortisone cream to the area.  He has no itching or rashes elsewhere on his body.  He denies fevers and chills.  Denies chest pain, shortness of breath, cough, hemoptysis.  Denies nausea, vomiting, constipation, diarrhea.  Denies recent weight loss or night sweats.  The patient is here for evaluation prior to cycle #24 of his treatment.  MEDICAL HISTORY: Past Medical History:  Diagnosis Date  . Adenocarcinoma of right lung, stage 3 ([facility]5/23/2018  . Adenocarcinoma of right lung, stage 3 (HCRockville5/23/2018  . Anemia   . Arthritis    "hx right hip"  . Asthma    "when I was a child"  . Atrial fibrillation (HCC)    Amiodarone  started 10/2011; Coumadin  . 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 St. Jude ICD implantation 10/03/12.  . Chronic anticoagulation   . Chronic fatigue 10/18/2016  . Chronic fatigue 10/18/2016  . Chronic systolic heart failure ([facility]   a. Echo 7/13: EF 25%;  b. echo 04/2012:  Mild LVH, EF 30-35%, Gr 1 DD, mild AI, mild MR, mild LAE  . COPD (chronic obstructive pulmonary disease) (H[facility]ll[facility]  . Dyslipidemia   . Dysrhythmia   . Encounter for antineoplastic chemotherapy 08/24/2016  . Gout   . History of blood transfusion 10/15/2013   "don't know where the blood's going; HgB down to 5"  . Hyperlipidemia   . Hypertension   . Hypothyroidism   . NICM (nonischemic cardiomyopathy) ([facility]   [place] [place]/14:  minimal CAD  . Obesity   . OSA on CPAP   . Tobacco abuse     ALLERGIES:  has No Known Allergies.  MEDICATIONS:  Current Outpatient Medications  Medication Sig Dispense Refill  . acetaminophen (TYLENOL) 500 MG tablet Take 1,000 mg by mouth every 6 (six) hours as needed for mild pain.    . Marland Kitchenlbuterol (PROAIR HFA) 108 (90 Base) MCG/ACT inhaler Inhale 2 puffs into the lungs every 4 (four) hours as needed for wheezing or shortness of breath. 1 Inhaler 2  . amiodarone (PACERONE) 100 MG tablet Take 1 tablet (100 mg total) by mouth daily. 90 tablet 3  . atorvastatin (LIPITOR) 20 MG tablet TAKE 1 TABLET BY MOUTH EVERY DAY 90 tablet 2  . carvedilol (COREG) 12.5 MG tablet TAKE 1  TABLET (12.5 MG TOTAL) BY MOUTH 2 (TWO) TIMES DAILY. 180 tablet 1  . CVS D3 2000 units CAPS Take 2,000 Units by mouth daily.   11  . feeding supplement, ENSURE ENLIVE, (ENSURE ENLIVE) LIQD Take 237 mLs by mouth 2 (two) times daily between meals. 237 mL 12  . ferrous gluconate (FERGON) 324 MG tablet Take 1 tablet (324 mg total) by mouth 3 (three) times daily with meals. 90 tablet 8  . Fluticasone-Umeclidin-Vilant (TRELEGY ELLIPTA) 100-62.5-25 MCG/INH AEPB Inhale 1 puff into the lungs daily. 3 each 3  .  furosemide (LASIX) 20 MG tablet TAKE 3 TABLETS (60 MG) EVERY TUE AND SAT, ALL OTHER DAYS TAKE 2 TABLETS (40 MG). 64 tablet 7  . hydrALAZINE (APRESOLINE) 100 MG tablet TAKE 1 TABLET BY MOUTH 3 (THREE) TIMES DAILY. 270 tablet 3  . levothyroxine (SYNTHROID, LEVOTHROID) 50 MCG tablet TAKE 1 TABLET BY MOUTH DAILY BEFORE BREAKFAST 90 tablet 3  . lidocaine-prilocaine (EMLA) cream APPLY GENEROUS AMOUNT TO PORT SITE AT LEAST 1 HR PRIOR TO TREATMENT. DO NOT RUB IN. 30 g 1  . lisinopril (PRINIVIL,ZESTRIL) 40 MG tablet Take 1 tablet (40 mg total) by mouth daily. Please keep 09/2017 appointment for further refills 90 tablet 0  . magnesium oxide (MAG-OX) 400 (241.3 Mg) MG tablet Take 1 tablet (400 mg total) by mouth daily. 30 tablet 0  . potassium chloride SA (KLOR-CON M20) 20 MEQ tablet TAKE 1 TABLET BY MOUTH EVERY DAY *ON DAYS WHEN LASIX IS TAKEN, TAKE 2 TABLETS*  Dr. Harrington Challenger for further refills. 3rd attempt 30 tablet 0  . rivaroxaban (XARELTO) 20 MG TABS tablet Take 1 tablet (20 mg total) by mouth daily. 90 tablet 2  . sildenafil (VIAGRA) 100 MG tablet Take 100 mg by mouth.    . spironolactone (ALDACTONE) 25 MG tablet Take 0.5 tablets (12.5 mg total) by mouth daily. Please keep upcoming appt with Dr. Harrington Challenger in June for future refills. Thank you 45 tablet 0  . sucralfate (CARAFATE) 1 g tablet TAKE 1 TABLET BY MOUTH FOUR TIMES DAILY (5 MINUTES BEFORE MEALS AND AT BEDTIME) FOR ESOPHAGITIS 120 tablet 0  . ULORIC 40 MG tablet Take 1 tablet (40 mg total) by mouth daily. 30 tablet 0  . prochlorperazine (COMPAZINE) 10 MG tablet Take 1 tablet (10 mg total) by mouth every 6 (six) hours as needed for nausea or vomiting. (Patient not taking: Reported on 11/22/2017) 30 tablet 0   No current facility-administered medications for this visit.     SURGICAL HISTORY:  Past Surgical History:  Procedure Laterality Date  . CARDIAC DEFIBRILLATOR PLACEMENT  2014  . CARDIOVERSION  2011  . COLONOSCOPY WITH PROPOFOL Left 10/17/2013    Procedure: COLONOSCOPY WITH PROPOFOL;  Surgeon: Inda Castle, MD;  Location: [facility];  Service: Endoscopy;  Laterality: Left;  . ESOPHAGOGASTRODUODENOSCOPY N/A 10/17/2013   Procedure: ESOPHAGOGASTRODUODENOSCOPY (EGD);  Surgeon: Inda Castle, MD;  Location: [facility];  Service: Endoscopy;  Laterality: N/A;  . GIVENS CAPSULE STUDY N/A 10/29/2013   Procedure: GIVENS CAPSULE STUDY;  Surgeon: Inda Castle, MD;  Location: [facility];  Service: Endoscopy;  Laterality: N/A;  . IMPLANTABLE CARDIOVERTER DEFIBRILLATOR IMPLANT Left 10/03/2012   Procedure: IMPLANTABLE CARDIOVERTER DEFIBRILLATOR IMPLANT;  Surgeon: Deboraha Sprang, MD;  Location: [facility] CATH LAB;  Service: Cardiovascular;  Laterality: Left;  . IR FLUORO GUIDE PORT INSERTION RIGHT  09/21/2016  . IR US GUIDE VASC ACCESS RIGHT  09/21/2016  . JOINT REPLACEMENT    . TONSILLECTOMY  1950's  .  TOTAL HIP ARTHROPLASTY Right 11/25/1997  . VIDEO BRONCHOSCOPY WITH ENDOBRONCHIAL ULTRASOUND N/A 08/09/2016   Procedure: VIDEO BRONCHOSCOPY WITH ENDOBRONCHIAL ULTRASOUND;  Surgeon: Javier Glazier, MD;  Location: [facility];  Service: Thoracic;  Laterality: N/A;    REVIEW OF SYSTEMS:   Review of Systems  Constitutional: Negative for appetite change, chills, fatigue, fever and unexpected weight change.  HENT:   Negative for mouth sores, nosebleeds, sore throat and trouble swallowing.   Eyes: Negative for eye problems and icterus.  Respiratory: Negative for cough, hemoptysis, shortness of breath and wheezing.   Cardiovascular: Negative for chest pain and leg swelling.  Gastrointestinal: Negative for abdominal pain, constipation, diarrhea, nausea and vomiting.  Genitourinary: Negative for bladder incontinence, difficulty urinating, dysuria, frequency and hematuria.   Musculoskeletal: Negative for back pain, gait problem, neck pain and neck stiffness.  Skin: Positive for itching to his right forearm.  Neurological: Negative for dizziness, extremity weakness,  gait problem, headaches, light-headedness and seizures.  Hematological: Negative for adenopathy. Does not bruise/bleed easily.  Psychiatric/Behavioral: Negative for confusion, depression and sleep disturbance. The patient is not nervous/anxious.     PHYSICAL EXAMINATION:  Blood pressure (!) 154/72, pulse 72, temperature 98 F (36.7 C), temperature source Oral, resp. rate 18, height '6\' 1"'  (1.854 m), weight 294 lb 11.2 oz (133.7 kg), SpO2 100 %.  ECOG PERFORMANCE STATUS: 1 - Symptomatic but completely ambulatory  Physical Exam  Constitutional: Oriented to person, place, and time and well-developed, well-nourished, and in no distress. No distress.  HENT:  Head: Normocephalic and atraumatic.  Mouth/Throat: Oropharynx is clear and moist. No oropharyngeal exudate.  Eyes: Conjunctivae are normal. Right eye exhibits no discharge. Left eye exhibits no discharge. No scleral icterus.  Neck: Normal range of motion. Neck supple.  Cardiovascular: Normal rate, regular rhythm, normal heart sounds and intact distal pulses.   Pulmonary/Chest: Effort normal and breath sounds normal. No respiratory distress. No wheezes. No rales.  Abdominal: Soft. Bowel sounds are normal. Exhibits no distension and no mass. There is no tenderness.  Musculoskeletal: Normal range of motion. Exhibits no edema.  Lymphadenopathy:    No cervical adenopathy.  Neurological: Alert and oriented to person, place, and time. Exhibits normal muscle tone. Gait normal. Coordination normal.  Skin: Skin is warm and dry. Not diaphoretic. No erythema. No pallor.  Right forearm with an approximately three-quarter centimeter darkened area. Psychiatric: Mood, memory and judgment normal.  Vitals reviewed.  LABORATORY DATA: Lab Results  Component Value Date   WBC 3.7 (L) 11/22/2017   HGB 11.3 (L) 11/22/2017   HCT 34.6 (L) 11/22/2017   MCV 91.7 11/22/2017   PLT 130 (L) 11/22/2017      Chemistry      Component Value Date/Time   NA 141  11/08/2017 0854   NA 141 03/29/2017 1038   K 3.5 11/08/2017 0854   K 4.0 03/29/2017 1038   CL 108 11/08/2017 0854   CO2 24 11/08/2017 0854   CO2 27 03/29/2017 1038   BUN 24 (H) 11/08/2017 0854   BUN 28.6 (H) 03/29/2017 1038   CREATININE 1.48 (H) 11/08/2017 0854   CREATININE 1.6 (H) 03/29/2017 1038      Component Value Date/Time   CALCIUM 8.9 11/08/2017 0854   CALCIUM 9.3 03/29/2017 1038   ALKPHOS 105 11/08/2017 0854   ALKPHOS 105 03/29/2017 1038   AST 17 11/08/2017 0854   AST 18 03/29/2017 1038   ALT 13 11/08/2017 0854   ALT 13 03/29/2017 1038   BILITOT 0.4 11/08/2017 0854  BILITOT 0.62 03/29/2017 1038       RADIOGRAPHIC STUDIES:  No results found.   ASSESSMENT/PLAN:  Primary cancer of right upper lobe of lung ([place]) This is a very pleasant 67 year old African-American male with stage IIIA non-small cell lung cancer, adenocarcinoma.  The patient completed 6 weeks of concurrent chemoradiation with weekly carboplatin and paclitaxel and tolerated his treatment well except for odynophagia. The patient is currently on consolidation treatment with immunotherapy with Imfinzi (Durvalumab) status post 23 cycles.  The patient tolerated the last cycle of his treatment well. I recommended for him to proceed with cycle #24 today as scheduled. I will see him back for follow-up visit in 2 weeks for evaluation before starting cycle #25.  For the itching on the right forearm, he was advised to try antifungal cream which he can obtain over-the-counter.  He was advised to call immediately if he has any concerning symptoms in the interval. The patient voices understanding of current disease status and treatment options and is in agreement with the current care plan. All questions were answered. The patient knows to call the clinic with any problems, questions or concerns. We can certainly see the patient much sooner if necessary.   No orders of the defined types were placed in this  encounter.    Mikey Bussing, DNP, AGPCNP-BC, AOCNP 11/22/17

## 2017-11-22 ENCOUNTER — Inpatient Hospital Stay (HOSPITAL_BASED_OUTPATIENT_CLINIC_OR_DEPARTMENT_OTHER): Payer: Medicare Other | Admitting: Oncology

## 2017-11-22 ENCOUNTER — Encounter: Payer: Self-pay | Admitting: *Deleted

## 2017-11-22 ENCOUNTER — Other Ambulatory Visit: Payer: Self-pay

## 2017-11-22 ENCOUNTER — Inpatient Hospital Stay: Payer: Medicare Other

## 2017-11-22 ENCOUNTER — Encounter: Payer: Self-pay | Admitting: Oncology

## 2017-11-22 ENCOUNTER — Telehealth: Payer: Self-pay | Admitting: Oncology

## 2017-11-22 VITALS — BP 154/72 | HR 72 | Temp 98.0°F | Resp 18 | Ht 73.0 in | Wt 294.7 lb

## 2017-11-22 DIAGNOSIS — M1990 Unspecified osteoarthritis, unspecified site: Secondary | ICD-10-CM | POA: Diagnosis not present

## 2017-11-22 DIAGNOSIS — Z5112 Encounter for antineoplastic immunotherapy: Secondary | ICD-10-CM | POA: Diagnosis not present

## 2017-11-22 DIAGNOSIS — Z79899 Other long term (current) drug therapy: Secondary | ICD-10-CM | POA: Diagnosis not present

## 2017-11-22 DIAGNOSIS — Z9989 Dependence on other enabling machines and devices: Secondary | ICD-10-CM

## 2017-11-22 DIAGNOSIS — I4891 Unspecified atrial fibrillation: Secondary | ICD-10-CM

## 2017-11-22 DIAGNOSIS — I110 Hypertensive heart disease with heart failure: Secondary | ICD-10-CM | POA: Diagnosis not present

## 2017-11-22 DIAGNOSIS — E785 Hyperlipidemia, unspecified: Secondary | ICD-10-CM

## 2017-11-22 DIAGNOSIS — E039 Hypothyroidism, unspecified: Secondary | ICD-10-CM

## 2017-11-22 DIAGNOSIS — C3411 Malignant neoplasm of upper lobe, right bronchus or lung: Secondary | ICD-10-CM

## 2017-11-22 DIAGNOSIS — J449 Chronic obstructive pulmonary disease, unspecified: Secondary | ICD-10-CM

## 2017-11-22 DIAGNOSIS — I5022 Chronic systolic (congestive) heart failure: Secondary | ICD-10-CM | POA: Diagnosis not present

## 2017-11-22 DIAGNOSIS — R5382 Chronic fatigue, unspecified: Secondary | ICD-10-CM

## 2017-11-22 DIAGNOSIS — G4733 Obstructive sleep apnea (adult) (pediatric): Secondary | ICD-10-CM | POA: Diagnosis not present

## 2017-11-22 DIAGNOSIS — Z7901 Long term (current) use of anticoagulants: Secondary | ICD-10-CM

## 2017-11-22 LAB — CBC WITH DIFFERENTIAL (CANCER CENTER ONLY)
BASOS PCT: 1 %
Basophils Absolute: 0 10*3/uL (ref 0.0–0.1)
EOS ABS: 0.1 10*3/uL (ref 0.0–0.5)
Eosinophils Relative: 2 %
HEMATOCRIT: 34.6 % — AB (ref 38.4–49.9)
HEMOGLOBIN: 11.3 g/dL — AB (ref 13.0–17.1)
LYMPHS ABS: 0.6 10*3/uL — AB (ref 0.9–3.3)
Lymphocytes Relative: 16 %
MCH: 29.9 pg (ref 27.2–33.4)
MCHC: 32.7 g/dL (ref 32.0–36.0)
MCV: 91.7 fL (ref 79.3–98.0)
Monocytes Absolute: 0.5 10*3/uL (ref 0.1–0.9)
Monocytes Relative: 15 %
NEUTROS ABS: 2.4 10*3/uL (ref 1.5–6.5)
NEUTROS PCT: 66 %
Platelet Count: 130 10*3/uL — ABNORMAL LOW (ref 140–400)
RBC: 3.77 MIL/uL — AB (ref 4.20–5.82)
RDW: 14.6 % (ref 11.0–14.6)
WBC Count: 3.7 10*3/uL — ABNORMAL LOW (ref 4.0–10.3)

## 2017-11-22 LAB — CMP (CANCER CENTER ONLY)
ALT: 13 U/L (ref 0–44)
ANION GAP: 9 (ref 5–15)
AST: 14 U/L — ABNORMAL LOW (ref 15–41)
Albumin: 3.3 g/dL — ABNORMAL LOW (ref 3.5–5.0)
Alkaline Phosphatase: 112 U/L (ref 38–126)
BUN: 24 mg/dL — ABNORMAL HIGH (ref 8–23)
CALCIUM: 9.2 mg/dL (ref 8.9–10.3)
CHLORIDE: 108 mmol/L (ref 98–111)
CO2: 27 mmol/L (ref 22–32)
CREATININE: 1.67 mg/dL — AB (ref 0.61–1.24)
GFR, EST AFRICAN AMERICAN: 48 mL/min — AB (ref >60)
GFR, Estimated: 41 mL/min — ABNORMAL LOW (ref >60)
Glucose, Bld: 136 mg/dL — ABNORMAL HIGH (ref 70–99)
Potassium: 3.4 mmol/L — ABNORMAL LOW (ref 3.5–5.1)
SODIUM: 144 mmol/L (ref 135–145)
Total Bilirubin: <0.2 mg/dL — ABNORMAL LOW (ref 0.3–1.2)
Total Protein: 6 g/dL — ABNORMAL LOW (ref 6.5–8.1)

## 2017-11-22 LAB — TSH: TSH: 2.622 u[IU]/mL (ref 0.320–4.118)

## 2017-11-22 MED ORDER — SODIUM CHLORIDE 0.9 % IV SOLN
10.4000 mg/kg | Freq: Once | INTRAVENOUS | Status: AC
  Administered 2017-11-22: 1360 mg via INTRAVENOUS
  Filled 2017-11-22: qty 20

## 2017-11-22 MED ORDER — HEPARIN SOD (PORK) LOCK FLUSH 100 UNIT/ML IV SOLN
500.0000 [IU] | Freq: Once | INTRAVENOUS | Status: DC | PRN
  Filled 2017-11-22: qty 5

## 2017-11-22 MED ORDER — SODIUM CHLORIDE 0.9 % IV SOLN
Freq: Once | INTRAVENOUS | Status: AC
  Administered 2017-11-22: 10:00:00 via INTRAVENOUS
  Filled 2017-11-22: qty 250

## 2017-11-22 MED ORDER — SODIUM CHLORIDE 0.9% FLUSH
10.0000 mL | INTRAVENOUS | Status: DC | PRN
  Filled 2017-11-22: qty 10

## 2017-11-22 NOTE — Telephone Encounter (Signed)
appts already scheduled per 8/22 los. - no additional appts added.

## 2017-11-22 NOTE — Progress Notes (Signed)
Infusion: Ok to treat 11/22/2017 with elevated Crt 1.67 per Mikey Bussing, NP

## 2017-11-22 NOTE — Patient Instructions (Signed)
[facility] Discharge Instructions for Patients Receiving Chemotherapy  Today you received the following chemotherapy agents imfinzi.  To help prevent nausea and vomiting after your treatment, we encourage you to take your nausea medication as directed.   If you develop nausea and vomiting that is not controlled by your nausea medication, call the clinic.   BELOW ARE SYMPTOMS THAT SHOULD BE REPORTED IMMEDIATELY:  *FEVER GREATER THAN 100.5 F  *CHILLS WITH OR WITHOUT FEVER  NAUSEA AND VOMITING THAT IS NOT CONTROLLED WITH YOUR NAUSEA MEDICATION  *UNUSUAL SHORTNESS OF BREATH  *UNUSUAL BRUISING OR BLEEDING  TENDERNESS IN MOUTH AND THROAT WITH OR WITHOUT PRESENCE OF ULCERS  *URINARY PROBLEMS  *BOWEL PROBLEMS  UNUSUAL RASH Items with * indicate a potential emergency and should be followed up as soon as possible.  Feel free to call the clinic should you have any questions or concerns. The clinic phone number is [phone].  Please show the [facility] at check-in to the Emergency Department and triage nurse.

## 2017-11-28 ENCOUNTER — Encounter: Payer: Self-pay | Admitting: Internal Medicine

## 2017-11-28 ENCOUNTER — Ambulatory Visit (INDEPENDENT_AMBULATORY_CARE_PROVIDER_SITE_OTHER): Payer: Medicare Other | Admitting: Internal Medicine

## 2017-11-28 VITALS — BP 120/76 | HR 89 | Ht 73.0 in | Wt 293.4 lb

## 2017-11-28 DIAGNOSIS — Z95810 Presence of automatic (implantable) cardiac defibrillator: Secondary | ICD-10-CM | POA: Diagnosis not present

## 2017-11-28 DIAGNOSIS — I480 Paroxysmal atrial fibrillation: Secondary | ICD-10-CM | POA: Diagnosis not present

## 2017-11-28 DIAGNOSIS — I5022 Chronic systolic (congestive) heart failure: Secondary | ICD-10-CM | POA: Diagnosis not present

## 2017-11-28 DIAGNOSIS — I428 Other cardiomyopathies: Secondary | ICD-10-CM | POA: Diagnosis not present

## 2017-11-28 LAB — CUP PACEART INCLINIC DEVICE CHECK
Date Time Interrogation Session: 20190828171851
HIGH POWER IMPEDANCE MEASURED VALUE: 39 Ohm
Implantable Lead Implant Date: 20140703
Implantable Lead Location: 753860
Implantable Pulse Generator Implant Date: 20140703
Lead Channel Pacing Threshold Amplitude: 0.5 V
Lead Channel Pacing Threshold Amplitude: 0.5 V
Lead Channel Pacing Threshold Pulse Width: 0.5 ms
Lead Channel Sensing Intrinsic Amplitude: 11.7 mV
Lead Channel Setting Sensing Sensitivity: 0.5 mV
MDC IDC MSMT BATTERY REMAINING LONGEVITY: 55 mo
MDC IDC MSMT LEADCHNL RV IMPEDANCE VALUE: 430 Ohm
MDC IDC MSMT LEADCHNL RV PACING THRESHOLD PULSEWIDTH: 0.5 ms
MDC IDC SET LEADCHNL RV PACING AMPLITUDE: 2.5 V
MDC IDC SET LEADCHNL RV PACING PULSEWIDTH: 0.5 ms
MDC IDC STAT BRADY RV PERCENT PACED: 0 %
Pulse Gen Serial Number: 1105031

## 2017-11-28 NOTE — Progress Notes (Signed)
Patient Care Team: Lucianne Lei, MD as PCP - General (Family Medicine) Fay Records, MD as PCP - Cardiology (Cardiology)   HPI  Jason Russell is a 67 y.o. male Seen in follow-up for an ICD implanted 2014 for primary prevention in the context of nonischemic heart disease. He also has a history of paroxysmal atrial fibrillation    Intercurrently been diagnosed with lung Ca and has just finished his chemo and XRT     Almost done with therapy   No chest pain.  With dyspnea on exertion, some peripheral edema.  He is assaultive goiter.     Date Cr K TSH LFTs PFTs  2/18    2.0      6/18  2.0    18    8/19 1.67 3.4 2.622 13     Echocardiogram 7/13  25% Echocardiogram 1/14 35% LHC  4/14  No obstructive CAD Echo 9/16   Past Medical History:  Diagnosis Date  . Adenocarcinoma of right lung, stage 3 ([facility]) 08/23/2016  . Adenocarcinoma of right lung, stage 3 ([facility]) 08/23/2016  . Anemia   . Arthritis    "hx right hip"  . Asthma    "when I was a child"  . Atrial fibrillation (HCC)    Amiodarone started 10/2011; Coumadin  . 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 St. Jude ICD implantation 10/03/12.  . Chronic anticoagulation   . Chronic fatigue 10/18/2016  . Chronic fatigue 10/18/2016  . Chronic systolic heart failure ([facility])    a. Echo 7/13: EF 25%;  b. echo 04/2012:  Mild LVH, EF 30-35%, Gr 1 DD, mild AI, mild MR, mild LAE  . COPD (chronic obstructive pulmonary disease) ([facility])   . Dyslipidemia   . Dysrhythmia   . Encounter for antineoplastic chemotherapy 08/24/2016  . Gout   . History of blood transfusion 10/15/2013   "don't know where the blood's going; HgB down to 5"  . Hyperlipidemia   . Hypertension   . Hypothyroidism   . NICM (nonischemic cardiomyopathy) ([facility])    [place] 4/14:  minimal CAD  . Obesity   . OSA on CPAP   . Tobacco abuse     Past Surgical History:  Procedure Laterality Date  . CARDIAC DEFIBRILLATOR PLACEMENT  2014  .  CARDIOVERSION  2011  . COLONOSCOPY WITH PROPOFOL Left 10/17/2013   Procedure: COLONOSCOPY WITH PROPOFOL;  Surgeon: Inda Castle, MD;  Location: [facility];  Service: Endoscopy;  Laterality: Left;  . ESOPHAGOGASTRODUODENOSCOPY N/A 10/17/2013   Procedure: ESOPHAGOGASTRODUODENOSCOPY (EGD);  Surgeon: Inda Castle, MD;  Location: [facility];  Service: Endoscopy;  Laterality: N/A;  . GIVENS CAPSULE STUDY N/A 10/29/2013   Procedure: GIVENS CAPSULE STUDY;  Surgeon: Inda Castle, MD;  Location: [facility];  Service: Endoscopy;  Laterality: N/A;  . IMPLANTABLE CARDIOVERTER DEFIBRILLATOR IMPLANT Left 10/03/2012   Procedure: IMPLANTABLE CARDIOVERTER DEFIBRILLATOR IMPLANT;  Surgeon: Deboraha Sprang, MD;  Location: [facility] CATH LAB;  Service: Cardiovascular;  Laterality: Left;  . IR FLUORO GUIDE PORT INSERTION RIGHT  09/21/2016  . IR US GUIDE VASC ACCESS RIGHT  09/21/2016  . JOINT REPLACEMENT    . TONSILLECTOMY  1950's  . TOTAL HIP ARTHROPLASTY Right 11/25/1997  . VIDEO BRONCHOSCOPY WITH ENDOBRONCHIAL ULTRASOUND N/A 08/09/2016   Procedure: VIDEO BRONCHOSCOPY WITH ENDOBRONCHIAL ULTRASOUND;  Surgeon: Javier Glazier, MD;  Location[facility] OR;  Service: Thoracic;  Laterality: N/A;    Current Outpatient Medications  Medication Sig Dispense Refill  .  acetaminophen (TYLENOL) 500 MG tablet Take 1,000 mg by mouth every 6 (six) hours as needed for mild pain.    [name] albuterol (PROAIR HFA) 108 (90 Base) MCG/ACT inhaler Inhale 2 puffs into the lungs every 4 (four) hours as needed for wheezing or shortness of breath. 1 Inhaler 2  . amiodarone (PACERONE) 100 MG tablet Take 1 tablet (100 mg total) by mouth daily. 90 tablet 3  . atorvastatin (LIPITOR) 20 MG tablet TAKE 1 TABLET BY MOUTH EVERY DAY 90 tablet 2  . carvedilol (COREG) 12.5 MG tablet TAKE 1 TABLET (12.5 MG TOTAL) BY MOUTH 2 (TWO) TIMES DAILY. 180 tablet 1  . CVS D3 2000 units CAPS Take 2,000 Units by mouth daily.   11  . feeding supplement, ENSURE ENLIVE, (ENSURE  ENLIVE) LIQD Take 237 mLs by mouth 2 (two) times daily between meals. 237 mL 12  . ferrous gluconate (FERGON) 324 MG tablet Take 1 tablet (324 mg total) by mouth 3 (three) times daily with meals. 90 tablet 8  . Fluticasone-Umeclidin-Vilant (TRELEGY ELLIPTA) 100-62.5-25 MCG/INH AEPB Inhale 1 puff into the lungs daily. 3 each 3  . furosemide (LASIX) 20 MG tablet TAKE 3 TABLETS (60 MG) EVERY TUE AND SAT, ALL OTHER DAYS TAKE 2 TABLETS (40 MG). 64 tablet 7  . hydrALAZINE (APRESOLINE) 100 MG tablet TAKE 1 TABLET BY MOUTH 3 (THREE) TIMES DAILY. 270 tablet 3  . levothyroxine (SYNTHROID, LEVOTHROID) 50 MCG tablet TAKE 1 TABLET BY MOUTH DAILY BEFORE BREAKFAST 90 tablet 3  . lidocaine-prilocaine (EMLA) cream APPLY GENEROUS AMOUNT TO PORT SITE AT LEAST 1 HR PRIOR TO TREATMENT. DO NOT RUB IN. 30 g 1  . lisinopril (PRINIVIL,ZESTRIL) 40 MG tablet Take 1 tablet (40 mg total) by mouth daily. Please keep 09/2017 appointment for further refills 90 tablet 0  . magnesium oxide (MAG-OX) 400 (241.3 Mg) MG tablet Take 1 tablet (400 mg total) by mouth daily. 30 tablet 0  . potassium chloride SA (KLOR-CON M20) 20 MEQ tablet TAKE 1 TABLET BY MOUTH EVERY DAY *ON DAYS WHEN LASIX IS TAKEN, TAKE 2 TABLETS*  Dr. Harrington Challenger for further refills. 3rd attempt 30 tablet 0  . prochlorperazine (COMPAZINE) 10 MG tablet Take 1 tablet (10 mg total) by mouth every 6 (six) hours as needed for nausea or vomiting. 30 tablet 0  . rivaroxaban (XARELTO) 20 MG TABS tablet Take 1 tablet (20 mg total) by mouth daily. 90 tablet 2  . sildenafil (VIAGRA) 100 MG tablet Take 100 mg by mouth.    . spironolactone (ALDACTONE) 25 MG tablet Take 0.5 tablets (12.5 mg total) by mouth daily. Please keep upcoming appt with Dr. Harrington Challenger in June for future refills. Thank you 45 tablet 0  . sucralfate (CARAFATE) 1 g tablet TAKE 1 TABLET BY MOUTH FOUR TIMES DAILY (5 MINUTES BEFORE MEALS AND AT BEDTIME) FOR ESOPHAGITIS 120 tablet 0  . ULORIC 40 MG tablet Take 1 tablet (40 mg total)  by mouth daily. 30 tablet 0   No current facility-administered medications for this visit.     No Known Allergies    Review of Systems negative except from HPI and PMH  Physical Exam BP 120/76   Pulse 89   Ht 6\' 1"  (1.854 m)   Wt 293 lb 6.4 oz (133.1 kg)   SpO2 96%   BMI 38.71 kg/m  Well developed and nourished in no acute distress HENT normal Neck supple with JVP-flat Clear Regular rate and rhythm, no murmurs or gallops Abd-soft with active BS No  Clubbing cyanosis 2+edema Skin-warm and dry A & Oriented  Grossly normal sensory and motor function  ECG  Sinus  89 18/10/41  Assessment and  Plan  HTN  ICD St Jude The patient's device was interrogated.  The information was reviewed. No changes were made in the programming.     CHF systolic chronic  Ventricular high rate episode  Possible AFib   Obesity  NICM  Afib-paroxysmal  Renal Insufficiency gd 3   OSA  CPAP   Mildly volume overloaded.  We will increase his diuretic to twice daily x3 days out of 7.  Intermittent atrial fibrillation of which he is unaware.  On Anticoagulation;  No bleeding issues    Ok for ED Rx

## 2017-11-28 NOTE — Patient Instructions (Signed)
Medication Instructions:  Your physician has recommended you make the following change in your medication:   1. Begin taking your lasix in the morning  Labwork: None ordered.  Testing/Procedures: None ordered.  Follow-Up: Your physician wants you to follow-up in: One Year with Dr Caryl Comes. You will receive a reminder letter in the mail two months in advance. If you don't receive a letter, please call our office to schedule the follow-up appointment.  Remote monitoring is used to monitor your Pacemaker of ICD from home. This monitoring reduces the number of office visits required to check your device to one time per year. It allows [place] to keep an eye on the functioning of your device to ensure it is working properly. You are scheduled for a device check from home on 12/10/2017. You may send your transmission at any time that day. If you have a wireless device, the transmission will be sent automatically. After your physician reviews your transmission, you will receive a postcard with your next transmission date.     Any Other Special Instructions Will Be Listed Below (If Applicable).     If you need a refill on your cardiac medications before your next appointment, please call your pharmacy.

## 2017-11-30 ENCOUNTER — Other Ambulatory Visit: Payer: Self-pay | Admitting: Adult Health

## 2017-11-30 ENCOUNTER — Other Ambulatory Visit: Payer: Self-pay | Admitting: Internal Medicine

## 2017-12-06 ENCOUNTER — Inpatient Hospital Stay (HOSPITAL_BASED_OUTPATIENT_CLINIC_OR_DEPARTMENT_OTHER): Payer: Medicare Other | Admitting: Internal Medicine

## 2017-12-06 ENCOUNTER — Inpatient Hospital Stay: Payer: Medicare Other | Attending: Internal Medicine

## 2017-12-06 ENCOUNTER — Encounter: Payer: Self-pay | Admitting: Internal Medicine

## 2017-12-06 ENCOUNTER — Telehealth: Payer: Self-pay | Admitting: Internal Medicine

## 2017-12-06 ENCOUNTER — Inpatient Hospital Stay: Payer: Medicare Other

## 2017-12-06 VITALS — BP 126/69 | HR 81 | Temp 98.1°F | Resp 18 | Ht 73.0 in | Wt 292.6 lb

## 2017-12-06 DIAGNOSIS — Z923 Personal history of irradiation: Secondary | ICD-10-CM | POA: Diagnosis not present

## 2017-12-06 DIAGNOSIS — Z5112 Encounter for antineoplastic immunotherapy: Secondary | ICD-10-CM

## 2017-12-06 DIAGNOSIS — C3411 Malignant neoplasm of upper lobe, right bronchus or lung: Secondary | ICD-10-CM

## 2017-12-06 DIAGNOSIS — R1310 Dysphagia, unspecified: Secondary | ICD-10-CM | POA: Insufficient documentation

## 2017-12-06 DIAGNOSIS — Z9221 Personal history of antineoplastic chemotherapy: Secondary | ICD-10-CM | POA: Insufficient documentation

## 2017-12-06 LAB — CMP (CANCER CENTER ONLY)
ALBUMIN: 3.3 g/dL — AB (ref 3.5–5.0)
ALK PHOS: 105 U/L (ref 38–126)
ALT: 15 U/L (ref 0–44)
AST: 17 U/L (ref 15–41)
Anion gap: 10 (ref 5–15)
BUN: 36 mg/dL — ABNORMAL HIGH (ref 8–23)
CALCIUM: 9.3 mg/dL (ref 8.9–10.3)
CHLORIDE: 106 mmol/L (ref 98–111)
CO2: 28 mmol/L (ref 22–32)
Creatinine: 2.13 mg/dL — ABNORMAL HIGH (ref 0.61–1.24)
GFR, EST AFRICAN AMERICAN: 35 mL/min — AB (ref >60)
GFR, Estimated: 31 mL/min — ABNORMAL LOW (ref >60)
Glucose, Bld: 115 mg/dL — ABNORMAL HIGH (ref 70–99)
POTASSIUM: 3.4 mmol/L — AB (ref 3.5–5.1)
SODIUM: 144 mmol/L (ref 135–145)
Total Bilirubin: 0.3 mg/dL (ref 0.3–1.2)
Total Protein: 6.2 g/dL — ABNORMAL LOW (ref 6.5–8.1)

## 2017-12-06 LAB — CBC WITH DIFFERENTIAL (CANCER CENTER ONLY)
Basophils Absolute: 0.1 10*3/uL (ref 0.0–0.1)
Basophils Relative: 3 %
Eosinophils Absolute: 0.1 10*3/uL (ref 0.0–0.5)
Eosinophils Relative: 2 %
HCT: 36.9 % — ABNORMAL LOW (ref 38.4–49.9)
Hemoglobin: 11.8 g/dL — ABNORMAL LOW (ref 13.0–17.1)
Lymphocytes Relative: 17 %
Lymphs Abs: 0.8 10*3/uL — ABNORMAL LOW (ref 0.9–3.3)
MCH: 29.6 pg (ref 27.2–33.4)
MCHC: 32 g/dL (ref 32.0–36.0)
MCV: 92.7 fL (ref 79.3–98.0)
Monocytes Absolute: 0.6 10*3/uL (ref 0.1–0.9)
Monocytes Relative: 14 %
Neutro Abs: 3 10*3/uL (ref 1.5–6.5)
Neutrophils Relative %: 64 %
Platelet Count: 134 10*3/uL — ABNORMAL LOW (ref 140–400)
RBC: 3.98 MIL/uL — ABNORMAL LOW (ref 4.20–5.82)
RDW: 14.1 % (ref 11.0–14.6)
WBC Count: 4.6 10*3/uL (ref 4.0–10.3)

## 2017-12-06 MED ORDER — SODIUM CHLORIDE 0.9 % IV SOLN
10.4000 mg/kg | Freq: Once | INTRAVENOUS | Status: AC
  Administered 2017-12-06: 1360 mg via INTRAVENOUS
  Filled 2017-12-06: qty 20

## 2017-12-06 MED ORDER — HEPARIN SOD (PORK) LOCK FLUSH 100 UNIT/ML IV SOLN
500.0000 [IU] | Freq: Once | INTRAVENOUS | Status: AC | PRN
  Administered 2017-12-06: 500 [IU]
  Filled 2017-12-06: qty 5

## 2017-12-06 MED ORDER — SODIUM CHLORIDE 0.9% FLUSH
10.0000 mL | INTRAVENOUS | Status: DC | PRN
  Administered 2017-12-06: 10 mL
  Filled 2017-12-06: qty 10

## 2017-12-06 MED ORDER — SODIUM CHLORIDE 0.9 % IV SOLN
Freq: Once | INTRAVENOUS | Status: AC
  Administered 2017-12-06: 10:00:00 via INTRAVENOUS
  Filled 2017-12-06: qty 250

## 2017-12-06 NOTE — Progress Notes (Signed)
Per Dr. Julien Nordmann, ok to treat today with creatinine of 2.13

## 2017-12-06 NOTE — Telephone Encounter (Signed)
Appts already scheduled per 9/5 los - no additional  appts needed.

## 2017-12-06 NOTE — Progress Notes (Signed)
[place] [phone]   Fax:(336) [phone]  OFFICE PROGRESS NOTE  Lucianne Lei, MD [address] [place] [address]  DIAGNOSIS: Stage IIIA (T1a, N2, M0) non-small cell lung cancer, adenocarcinoma presented with right upper lobe lung nodule in addition to mediastinal lymphadenopathy diagnosed in March 2018.  Biomarker Findings Tumor Mutational Burden - TMB-Intermediate (8 Muts/Mb) Microsatellite Status - MS-Stable Genomic Findings For a complete list of the genes assayed, please refer to the Appendix. ERBB2 amplification - equivocal? DNMT3A K477f*192 FUBP1 Q40* KEAP1 [id number] TP53 C275F 7 Disease relevant genes with no reportable alterations: EGFR, KRAS, ALK, BRAF, MET, RET, ROS1   PRIOR THERAPY: course of concurrent chemoradiation with weekly carboplatin for AUC of 2 and paclitaxel 45 MG/M2. Status post 6 cycles. Last cycle was given 10/16/2016.  CURRENT THERAPY:  Consolidation treatment with immunotherapy with Imfinzi (Durvalumab) 10 MG/M2 every 2 weeks. First dose 12/21/2016.  Status post 24 cycles.  INTERVAL HISTORY: Jason Consiglio89.o. male returns to the clinic today for follow-up visit.  The patient is feeling fine today with no concerning complaints.  He continues to tolerate his treatment with Imfinzi (Durvalumab) fairly well.  He has no nausea, vomiting, diarrhea or constipation.  He denied having any weight loss or night sweats.  He has no chest pain but continues to have mild shortness of breath with exertion with no cough or hemoptysis.  He is here today for evaluation before starting cycle #25 of his treatment.  MEDICAL HISTORY: Past Medical History:  Diagnosis Date  . Adenocarcinoma of right lung, stage 3 ([facility]5/23/2018  . Adenocarcinoma of right lung, stage 3 (HCCullen5/23/2018  . Anemia   . Arthritis    "hx right hip"  . Asthma    "when I was a child"  . Atrial fibrillation (HCC)    Amiodarone started 10/2011;  Coumadin  . 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 St. Jude ICD implantation 10/03/12.  . Chronic anticoagulation   . Chronic fatigue 10/18/2016  . Chronic fatigue 10/18/2016  . Chronic systolic heart failure ([facility]CCa[place]   a. Echo 7/13: EF 25%;  b. echo 04/2012:  Mild LVH, EF 30-35%, Gr 1 DD, mild AI, mild MR, mild LAE  . COPD (chronic obstructive pulmonary disease) (HCFort [facility]  . Dyslipidemia   . Dysrhythmia   . Encounter for antineoplastic chemotherapy 08/24/2016  . Gout   . History of blood transfusion 10/15/2013   "don't know where the blood's going; HgB down to 5"  . Hyperlipidemia   . Hypertension   . Hypothyroidism   . NICM (nonischemic cardiomyopathy) (HCTem[facility]   [facility]/14:  minimal CAD  . Obesity   . OSA on CPAP   . Tobacco abuse     ALLERGIES:  has No Known Allergies.  MEDICATIONS:  Current Outpatient Medications  Medication Sig Dispense Refill  . acetaminophen (TYLENOL) 500 MG tablet Take 1,000 mg by mouth every 6 (six) hours as needed for mild pain.    . Marland Kitchenlbuterol (PROVENTIL HFA;VENTOLIN HFA) 108 (90 Base) MCG/ACT inhaler INHALE 2 PUFFS INTO THE LUNGS EVERY 4 (FOUR) HOURS AS NEEDED FOR WHEEZING OR SHORTNESS OF BREATH 8.5 Inhaler 2  . amiodarone (PACERONE) 100 MG tablet Take 1 tablet (100 mg total) by mouth daily. 90 tablet 3  . atorvastatin (LIPITOR) 20 MG tablet TAKE 1 TABLET BY MOUTH EVERY DAY 90 tablet 2  . carvedilol (COREG) 12.5 MG tablet TAKE 1 TABLET (12.5  MG TOTAL) BY MOUTH 2 (TWO) TIMES DAILY. 180 tablet 1  . CVS D3 2000 units CAPS Take 2,000 Units by mouth daily.   11  . feeding supplement, ENSURE ENLIVE, (ENSURE ENLIVE) LIQD Take 237 mLs by mouth 2 (two) times daily between meals. 237 mL 12  . ferrous gluconate (FERGON) 324 MG tablet Take 1 tablet (324 mg total) by mouth 3 (three) times daily with meals. 90 tablet 8  . Fluticasone-Umeclidin-Vilant (TRELEGY ELLIPTA) 100-62.5-25 MCG/INH AEPB Inhale 1 puff into the lungs daily. 3 each 3  .  furosemide (LASIX) 20 MG tablet TAKE 3 TABLETS (60 MG) EVERY TUE AND SAT, ALL OTHER DAYS TAKE 2 TABLETS (40 MG). 64 tablet 7  . hydrALAZINE (APRESOLINE) 100 MG tablet TAKE 1 TABLET BY MOUTH 3 (THREE) TIMES DAILY. 270 tablet 3  . levothyroxine (SYNTHROID, LEVOTHROID) 50 MCG tablet TAKE 1 TABLET BY MOUTH DAILY BEFORE BREAKFAST 90 tablet 3  . lidocaine-prilocaine (EMLA) cream APPLY GENEROUS AMOUNT TO PORT SITE AT LEAST 1 HR PRIOR TO TREATMENT. DO NOT RUB IN. 30 g 1  . lisinopril (PRINIVIL,ZESTRIL) 40 MG tablet Take 1 tablet (40 mg total) by mouth daily. Please keep 09/2017 appointment for further refills 90 tablet 0  . magnesium oxide (MAG-OX) 400 (241.3 Mg) MG tablet Take 1 tablet (400 mg total) by mouth daily. 30 tablet 0  . potassium chloride SA (KLOR-CON M20) 20 MEQ tablet TAKE 1 TABLET BY MOUTH EVERY DAY *ON DAYS WHEN LASIX IS TAKEN, TAKE 2 TABLETS*  Dr. Harrington Challenger for further refills. 3rd attempt 30 tablet 0  . prochlorperazine (COMPAZINE) 10 MG tablet Take 1 tablet (10 mg total) by mouth every 6 (six) hours as needed for nausea or vomiting. 30 tablet 0  . rivaroxaban (XARELTO) 20 MG TABS tablet Take 1 tablet (20 mg total) by mouth daily. 90 tablet 2  . sildenafil (VIAGRA) 100 MG tablet Take 100 mg by mouth.    . spironolactone (ALDACTONE) 25 MG tablet Take 0.5 tablets (12.5 mg total) by mouth daily. 45 tablet 2  . sucralfate (CARAFATE) 1 g tablet TAKE 1 TABLET BY MOUTH FOUR TIMES DAILY (5 MINUTES BEFORE MEALS AND AT BEDTIME) FOR ESOPHAGITIS 120 tablet 0  . ULORIC 40 MG tablet Take 1 tablet (40 mg total) by mouth daily. 30 tablet 0   No current facility-administered medications for this visit.     SURGICAL HISTORY:  Past Surgical History:  Procedure Laterality Date  . CARDIAC DEFIBRILLATOR PLACEMENT  2014  . CARDIOVERSION  2011  . COLONOSCOPY WITH PROPOFOL Left 10/17/2013   Procedure: COLONOSCOPY WITH PROPOFOL;  Surgeon: Inda Castle, MD;  Location: [facility];  Service: Endoscopy;   Laterality: Left;  . ESOPHAGOGASTRODUODENOSCOPY N/A 10/17/2013   Procedure: ESOPHAGOGASTRODUODENOSCOPY (EGD);  Surgeon: Inda Castle, MD;  Location: [facility];  Service: Endoscopy;  Laterality: N/A;  . GIVENS CAPSULE STUDY N/A 10/29/2013   Procedure: GIVENS CAPSULE STUDY;  Surgeon: Inda Castle, MD;  Location: [facility];  Service: Endoscopy;  Laterality: N/A;  . IMPLANTABLE CARDIOVERTER DEFIBRILLATOR IMPLANT Left 10/03/2012   Procedure: IMPLANTABLE CARDIOVERTER DEFIBRILLATOR IMPLANT;  Surgeon: Deboraha Sprang, MD;  Location[facility] CATH LAB;  Service: Cardiovascular;  Laterality: Left;  . IR FLUORO GUIDE PORT INSERTION RIGHT  09/21/2016  . IR US GUIDE VASC ACCESS RIGHT  09/21/2016  . JOINT REPLACEMENT    . TONSILLECTOMY  1950's  . TOTAL HIP ARTHROPLASTY Right 11/25/1997  . VIDEO BRONCHOSCOPY WITH ENDOBRONCHIAL ULTRASOUND N/A 08/09/2016   Procedure: VIDEO BRONCHOSCOPY WITH  ENDOBRONCHIAL ULTRASOUND;  Surgeon: Javier Glazier, MD;  Location: [facility] OR;  Service: Thoracic;  Laterality: N/A;    REVIEW OF SYSTEMS:  A comprehensive review of systems was negative except for: Respiratory: positive for dyspnea on exertion   PHYSICAL EXAMINATION: General appearance: alert, cooperative and no distress Head: Normocephalic, without obvious abnormality, atraumatic Neck: no adenopathy, no JVD, supple, symmetrical, trachea midline and thyroid not enlarged, symmetric, no tenderness/mass/nodules Lymph nodes: Cervical, supraclavicular, and axillary nodes normal. Resp: clear to auscultation bilaterally Back: symmetric, no curvature. ROM normal. No CVA tenderness. Cardio: regular rate and rhythm, S1, S2 normal, no murmur, click, rub or gallop GI: soft, non-tender; bowel sounds normal; no masses,  no organomegaly Extremities: extremities normal, atraumatic, no cyanosis or edema  ECOG PERFORMANCE STATUS: 1 - Symptomatic but completely ambulatory  Blood pressure 126/69, pulse 81, temperature 98.1 F (36.7 C),  temperature source Oral, resp. rate 18, height '6\' 1"'  (1.854 m), weight 292 lb 9.6 oz (132.7 kg), SpO2 100 %.  LABORATORY DATA: Lab Results  Component Value Date   WBC 4.6 12/06/2017   HGB 11.8 (L) 12/06/2017   HCT 36.9 (L) 12/06/2017   MCV 92.7 12/06/2017   PLT 134 (L) 12/06/2017      Chemistry      Component Value Date/Time   NA 144 11/22/2017 0809   NA 141 03/29/2017 1038   K 3.4 (L) 11/22/2017 0809   K 4.0 03/29/2017 1038   CL 108 11/22/2017 0809   CO2 27 11/22/2017 0809   CO2 27 03/29/2017 1038   BUN 24 (H) 11/22/2017 0809   BUN 28.6 (H) 03/29/2017 1038   CREATININE 1.67 (H) 11/22/2017 0809   CREATININE 1.6 (H) 03/29/2017 1038      Component Value Date/Time   CALCIUM 9.2 11/22/2017 0809   CALCIUM 9.3 03/29/2017 1038   ALKPHOS 112 11/22/2017 0809   ALKPHOS 105 03/29/2017 1038   AST 14 (L) 11/22/2017 0809   AST 18 03/29/2017 1038   ALT 13 11/22/2017 0809   ALT 13 03/29/2017 1038   BILITOT <0.2 (L) 11/22/2017 0809   BILITOT 0.62 03/29/2017 1038       RADIOGRAPHIC STUDIES: No results found.  ASSESSMENT AND PLAN:  This is a very pleasant 67 years old African-American male with stage IIIA non-small cell lung cancer, adenocarcinoma.  The patient completed 6 weeks of concurrent chemoradiation with weekly carboplatin and paclitaxel and tolerated his treatment well except for odynophagia. The patient is currently on consolidation treatment with immunotherapy with Imfinzi (Durvalumab) status post 24 cycles.  He continues to tolerate this treatment well with no concerning adverse effects. I recommended for him to proceed with cycle #25 today as scheduled. I will see the patient back for follow-up visit in 2 weeks for evaluation before the last cycle of his treatment. He was advised to call immediately if he has any concerning symptoms in the interval. The patient voices understanding of current disease status and treatment options and is in agreement with the current care  plan. All questions were answered. The patient knows to call the clinic with any problems, questions or concerns. We can certainly see the patient much sooner if necessary.  Disclaimer: This note was dictated with voice recognition software. Similar sounding words can inadvertently be transcribed and may not be corrected upon review.

## 2017-12-06 NOTE — Patient Instructions (Signed)
[facility] Discharge Instructions for Patients Receiving Chemotherapy  Today you received the following chemotherapy agents imfinzi.  To help prevent nausea and vomiting after your treatment, we encourage you to take your nausea medication as directed.   If you develop nausea and vomiting that is not controlled by your nausea medication, call the clinic.   BELOW ARE SYMPTOMS THAT SHOULD BE REPORTED IMMEDIATELY:  *FEVER GREATER THAN 100.5 F  *CHILLS WITH OR WITHOUT FEVER  NAUSEA AND VOMITING THAT IS NOT CONTROLLED WITH YOUR NAUSEA MEDICATION  *UNUSUAL SHORTNESS OF BREATH  *UNUSUAL BRUISING OR BLEEDING  TENDERNESS IN MOUTH AND THROAT WITH OR WITHOUT PRESENCE OF ULCERS  *URINARY PROBLEMS  *BOWEL PROBLEMS  UNUSUAL RASH Items with * indicate a potential emergency and should be followed up as soon as possible.  Feel free to call the clinic should you have any questions or concerns. The clinic phone number is [phone].  Please show the [place] at check-in to the Emergency Department and triage nurse.

## 2017-12-10 ENCOUNTER — Ambulatory Visit (INDEPENDENT_AMBULATORY_CARE_PROVIDER_SITE_OTHER): Payer: Medicare Other | Admitting: *Deleted

## 2017-12-10 DIAGNOSIS — I428 Other cardiomyopathies: Secondary | ICD-10-CM

## 2017-12-10 NOTE — Progress Notes (Signed)
Remote ICD transmission.   

## 2017-12-11 ENCOUNTER — Encounter: Payer: Self-pay | Admitting: Cardiology

## 2017-12-20 ENCOUNTER — Telehealth: Payer: Self-pay | Admitting: Internal Medicine

## 2017-12-20 ENCOUNTER — Inpatient Hospital Stay (HOSPITAL_BASED_OUTPATIENT_CLINIC_OR_DEPARTMENT_OTHER): Payer: Medicare Other | Admitting: Internal Medicine

## 2017-12-20 ENCOUNTER — Inpatient Hospital Stay: Payer: Medicare Other

## 2017-12-20 ENCOUNTER — Encounter: Payer: Self-pay | Admitting: Internal Medicine

## 2017-12-20 VITALS — BP 149/85 | HR 86 | Temp 98.5°F | Resp 18 | Ht 73.0 in | Wt 289.7 lb

## 2017-12-20 DIAGNOSIS — Z5112 Encounter for antineoplastic immunotherapy: Secondary | ICD-10-CM | POA: Diagnosis not present

## 2017-12-20 DIAGNOSIS — C3411 Malignant neoplasm of upper lobe, right bronchus or lung: Secondary | ICD-10-CM

## 2017-12-20 DIAGNOSIS — Z923 Personal history of irradiation: Secondary | ICD-10-CM | POA: Diagnosis not present

## 2017-12-20 DIAGNOSIS — R1310 Dysphagia, unspecified: Secondary | ICD-10-CM | POA: Diagnosis not present

## 2017-12-20 DIAGNOSIS — Z9221 Personal history of antineoplastic chemotherapy: Secondary | ICD-10-CM

## 2017-12-20 DIAGNOSIS — C3490 Malignant neoplasm of unspecified part of unspecified bronchus or lung: Secondary | ICD-10-CM

## 2017-12-20 LAB — CBC WITH DIFFERENTIAL (CANCER CENTER ONLY)
Basophils Absolute: 0 10*3/uL (ref 0.0–0.1)
Basophils Relative: 1 %
Eosinophils Absolute: 0.1 10*3/uL (ref 0.0–0.5)
Eosinophils Relative: 2 %
HCT: 37.2 % — ABNORMAL LOW (ref 38.4–49.9)
HEMOGLOBIN: 12.3 g/dL — AB (ref 13.0–17.1)
LYMPHS ABS: 0.7 10*3/uL — AB (ref 0.9–3.3)
LYMPHS PCT: 15 %
MCH: 29.8 pg (ref 27.2–33.4)
MCHC: 33.1 g/dL (ref 32.0–36.0)
MCV: 90 fL (ref 79.3–98.0)
Monocytes Absolute: 0.6 10*3/uL (ref 0.1–0.9)
Monocytes Relative: 14 %
NEUTROS ABS: 3.1 10*3/uL (ref 1.5–6.5)
NEUTROS PCT: 68 %
Platelet Count: 157 10*3/uL (ref 140–400)
RBC: 4.14 MIL/uL — ABNORMAL LOW (ref 4.20–5.82)
RDW: 14.5 % (ref 11.0–14.6)
WBC Count: 4.5 10*3/uL (ref 4.0–10.3)

## 2017-12-20 LAB — CMP (CANCER CENTER ONLY)
ALT: 13 U/L (ref 0–44)
AST: 17 U/L (ref 15–41)
Albumin: 3.4 g/dL — ABNORMAL LOW (ref 3.5–5.0)
Alkaline Phosphatase: 115 U/L (ref 38–126)
Anion gap: 10 (ref 5–15)
BUN: 35 mg/dL — AB (ref 8–23)
CHLORIDE: 106 mmol/L (ref 98–111)
CO2: 29 mmol/L (ref 22–32)
Calcium: 9.4 mg/dL (ref 8.9–10.3)
Creatinine: 1.86 mg/dL — ABNORMAL HIGH (ref 0.61–1.24)
GFR, EST AFRICAN AMERICAN: 42 mL/min — AB (ref >60)
GFR, EST NON AFRICAN AMERICAN: 36 mL/min — AB (ref >60)
Glucose, Bld: 113 mg/dL — ABNORMAL HIGH (ref 70–99)
POTASSIUM: 3.6 mmol/L (ref 3.5–5.1)
Sodium: 145 mmol/L (ref 135–145)
Total Bilirubin: 0.4 mg/dL (ref 0.3–1.2)
Total Protein: 6.5 g/dL (ref 6.5–8.1)

## 2017-12-20 MED ORDER — HEPARIN SOD (PORK) LOCK FLUSH 100 UNIT/ML IV SOLN
500.0000 [IU] | Freq: Once | INTRAVENOUS | Status: AC | PRN
  Administered 2017-12-20: 500 [IU]
  Filled 2017-12-20: qty 5

## 2017-12-20 MED ORDER — SODIUM CHLORIDE 0.9 % IV SOLN
10.4000 mg/kg | Freq: Once | INTRAVENOUS | Status: AC
  Administered 2017-12-20: 1360 mg via INTRAVENOUS
  Filled 2017-12-20: qty 20

## 2017-12-20 MED ORDER — SODIUM CHLORIDE 0.9% FLUSH
10.0000 mL | INTRAVENOUS | Status: DC | PRN
  Administered 2017-12-20: 10 mL
  Filled 2017-12-20: qty 10

## 2017-12-20 MED ORDER — SODIUM CHLORIDE 0.9 % IV SOLN
Freq: Once | INTRAVENOUS | Status: AC
  Administered 2017-12-20: 10:00:00 via INTRAVENOUS
  Filled 2017-12-20: qty 250

## 2017-12-20 NOTE — Progress Notes (Signed)
[place] [phone]   Fax:([phone]  OFFICE PROGRESS NOTE  Lucianne Lei, MD [address] [place] [address]  DIAGNOSIS: Stage IIIA (T1a, N2, M0) non-small cell lung cancer, adenocarcinoma presented with right upper lobe lung nodule in addition to mediastinal lymphadenopathy diagnosed in March 2018.  Biomarker Findings Tumor Mutational Burden - TMB-Intermediate (8 Muts/Mb) Microsatellite Status - MS-Stable Genomic Findings For a complete list of the genes assayed, please refer to the Appendix. ERBB2 amplification - equivocal? DNMT3A K459f*192 FUBP1 Q40* KEAP1 [id number] TP53 C275F 7 Disease relevant genes with no reportable alterations: EGFR, KRAS, ALK, BRAF, MET, RET, ROS1   PRIOR THERAPY: course of concurrent chemoradiation with weekly carboplatin for AUC of 2 and paclitaxel 45 MG/M2. Status post 6 cycles. Last cycle was given 10/16/2016.  CURRENT THERAPY:  Consolidation treatment with immunotherapy with Imfinzi (Durvalumab) 10 MG/M2 every 2 weeks. First dose 12/21/2016.  Status post 25 cycles.  INTERVAL HISTORY: Jason Zenon89.o. male returns to the clinic today for follow-up visit accompanied by friend.  The patient is feeling fine today with no concerning complaints except for mild shortness of breath with exertion.  He denied having any chest pain, cough or hemoptysis.  He denied having any recent weight loss or night sweats.  He has no nausea, vomiting, diarrhea or constipation.  He continues to tolerate his treatment with Imfinzi (Durvalumab) fairly well.  He is here today for evaluation before receiving the last cycle of his treatment.  MEDICAL HISTORY: Past Medical History:  Diagnosis Date  . Adenocarcinoma of right lung, stage 3 (HCSouth [place]5/23/2018  . Adenocarcinoma of right lung, stage 3 (HCRichton Park5/23/2018  . Anemia   . Arthritis    "hx right hip"  . Asthma    "when I was a child"  . Atrial fibrillation (HCC)    Amiodarone started 10/2011; Coumadin  . 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 St. Jude ICD implantation 10/03/12.  . Chronic anticoagulation   . Chronic fatigue 10/18/2016  . Chronic fatigue 10/18/2016  . Chronic systolic heart failure (HCSpen[facility]   a. Echo 7/13: EF 25%;  b. echo 04/2012:  Mild LVH, EF 30-35%, Gr 1 DD, mild AI, mild MR, mild LAE  . COPD (chronic obstructive pulmonary disease) (HCLoma Linda  . Dyslipidemia   . Dysrhythmia   . Encounter for antineoplastic chemotherapy 08/24/2016  . Gout   . History of blood transfusion 10/15/2013   "don't know where the blood's going; HgB down to 5"  . Hyperlipidemia   . Hypertension   . Hypothyroidism   . NICM (nonischemic cardiomyopathy) (HCNordic   [place]HO[place]/14:  minimal CAD  . Obesity   . OSA on CPAP   . Tobacco abuse     ALLERGIES:  has No Known Allergies.  MEDICATIONS:  Current Outpatient Medications  Medication Sig Dispense Refill  . acetaminophen (TYLENOL) 500 MG tablet Take 1,000 mg by mouth every 6 (six) hours as needed for mild pain.    . Marland Kitchenlbuterol (PROVENTIL HFA;VENTOLIN HFA) 108 (90 Base) MCG/ACT inhaler INHALE 2 PUFFS INTO THE LUNGS EVERY 4 (FOUR) HOURS AS NEEDED FOR WHEEZING OR SHORTNESS OF BREATH 8.5 Inhaler 2  . amiodarone (PACERONE) 100 MG tablet Take 1 tablet (100 mg total) by mouth daily. 90 tablet 3  . atorvastatin (LIPITOR) 20 MG tablet TAKE 1 TABLET BY MOUTH EVERY DAY 90 tablet 2  . carvedilol (COREG) 12.5 MG tablet TAKE 1 TABLET (  12.5 MG TOTAL) BY MOUTH 2 (TWO) TIMES DAILY. 180 tablet 1  . CVS D3 2000 units CAPS Take 2,000 Units by mouth daily.   11  . feeding supplement, ENSURE ENLIVE, (ENSURE ENLIVE) LIQD Take 237 mLs by mouth 2 (two) times daily between meals. 237 mL 12  . ferrous gluconate (FERGON) 324 MG tablet Take 1 tablet (324 mg total) by mouth 3 (three) times daily with meals. 90 tablet 8  . Fluticasone-Umeclidin-Vilant (TRELEGY ELLIPTA) 100-62.5-25 MCG/INH AEPB Inhale 1 puff into the  lungs daily. 3 each 3  . furosemide (LASIX) 20 MG tablet TAKE 3 TABLETS (60 MG) EVERY TUE AND SAT, ALL OTHER DAYS TAKE 2 TABLETS (40 MG). 64 tablet 7  . hydrALAZINE (APRESOLINE) 100 MG tablet TAKE 1 TABLET BY MOUTH 3 (THREE) TIMES DAILY. 270 tablet 3  . levothyroxine (SYNTHROID, LEVOTHROID) 50 MCG tablet TAKE 1 TABLET BY MOUTH DAILY BEFORE BREAKFAST 90 tablet 3  . lidocaine-prilocaine (EMLA) cream APPLY GENEROUS AMOUNT TO PORT SITE AT LEAST 1 HR PRIOR TO TREATMENT. DO NOT RUB IN. 30 g 1  . lisinopril (PRINIVIL,ZESTRIL) 40 MG tablet Take 1 tablet (40 mg total) by mouth daily. Please keep 09/2017 appointment for further refills 90 tablet 0  . magnesium oxide (MAG-OX) 400 (241.3 Mg) MG tablet Take 1 tablet (400 mg total) by mouth daily. 30 tablet 0  . potassium chloride SA (KLOR-CON M20) 20 MEQ tablet TAKE 1 TABLET BY MOUTH EVERY DAY *ON DAYS WHEN LASIX IS TAKEN, TAKE 2 TABLETS*  Dr. Harrington Challenger for further refills. 3rd attempt 30 tablet 0  . prochlorperazine (COMPAZINE) 10 MG tablet Take 1 tablet (10 mg total) by mouth every 6 (six) hours as needed for nausea or vomiting. (Patient not taking: Reported on 12/06/2017) 30 tablet 0  . rivaroxaban (XARELTO) 20 MG TABS tablet Take 1 tablet (20 mg total) by mouth daily. 90 tablet 2  . sildenafil (VIAGRA) 100 MG tablet Take 100 mg by mouth.    . spironolactone (ALDACTONE) 25 MG tablet Take 0.5 tablets (12.5 mg total) by mouth daily. 45 tablet 2  . sucralfate (CARAFATE) 1 g tablet TAKE 1 TABLET BY MOUTH FOUR TIMES DAILY (5 MINUTES BEFORE MEALS AND AT BEDTIME) FOR ESOPHAGITIS 120 tablet 0  . ULORIC 40 MG tablet Take 1 tablet (40 mg total) by mouth daily. 30 tablet 0   No current facility-administered medications for this visit.     SURGICAL HISTORY:  Past Surgical History:  Procedure Laterality Date  . CARDIAC DEFIBRILLATOR PLACEMENT  2014  . CARDIOVERSION  2011  . COLONOSCOPY WITH PROPOFOL Left 10/17/2013   Procedure: COLONOSCOPY WITH PROPOFOL;  Surgeon: Inda Castle, MD;  Location: [facility];  Service: Endoscopy;  Laterality: Left;  . ESOPHAGOGASTRODUODENOSCOPY N/A 10/17/2013   Procedure: ESOPHAGOGASTRODUODENOSCOPY (EGD);  Surgeon: Inda Castle, MD;  Location: [facility];  Service: Endoscopy;  Laterality: N/A;  . GIVENS CAPSULE STUDY N/A 10/29/2013   Procedure: GIVENS CAPSULE STUDY;  Surgeon: Inda Castle, MD;  Location: [facility];  Service: Endoscopy;  Laterality: N/A;  . IMPLANTABLE CARDIOVERTER DEFIBRILLATOR IMPLANT Left 10/03/2012   Procedure: IMPLANTABLE CARDIOVERTER DEFIBRILLATOR IMPLANT;  Surgeon: Deboraha Sprang, MD;  Location: [facility] CATH LAB;  Service: Cardiovascular;  Laterality: Left;  . IR FLUORO GUIDE PORT INSERTION RIGHT  09/21/2016  . IR US GUIDE VASC ACCESS RIGHT  09/21/2016  . JOINT REPLACEMENT    . TONSILLECTOMY  1950's  . TOTAL HIP ARTHROPLASTY Right 11/25/1997  . VIDEO BRONCHOSCOPY WITH ENDOBRONCHIAL ULTRASOUND N/A  08/09/2016   Procedure: VIDEO BRONCHOSCOPY WITH ENDOBRONCHIAL ULTRASOUND;  Surgeon: Javier Glazier, MD;  Location[facility] OR;  Service: Thoracic;  Laterality: N/A;    REVIEW OF SYSTEMS:  A comprehensive review of systems was negative except for: Respiratory: positive for dyspnea on exertion   PHYSICAL EXAMINATION: General appearance: alert, cooperative and no distress Head: Normocephalic, without obvious abnormality, atraumatic Neck: no adenopathy, no JVD, supple, symmetrical, trachea midline and thyroid not enlarged, symmetric, no tenderness/mass/nodules Lymph nodes: Cervical, supraclavicular, and axillary nodes normal. Resp: clear to auscultation bilaterally Back: symmetric, no curvature. ROM normal. No CVA tenderness. Cardio: regular rate and rhythm, S1, S2 normal, no murmur, click, rub or gallop GI: soft, non-tender; bowel sounds normal; no masses,  no organomegaly Extremities: extremities normal, atraumatic, no cyanosis or edema  ECOG PERFORMANCE STATUS: 1 - Symptomatic but completely ambulatory  Blood  pressure (!) 149/85, pulse 86, temperature 98.5 F (36.9 C), temperature source Oral, resp. rate 18, height _0  (1.854 m), weight 289 lb 11.2 oz (131.4 kg), SpO2 100 %.  LABORATORY DATA: Lab Results  Component Value Date   WBC 4.5 12/20/2017   HGB 12.3 (L) 12/20/2017   HCT 37.2 (L) 12/20/2017   MCV 90.0 12/20/2017   PLT 157 12/20/2017      Chemistry      Component Value Date/Time   NA 144 12/06/2017 0826   NA 141 03/29/2017 1038   K 3.4 (L) 12/06/2017 0826   K 4.0 03/29/2017 1038   CL 106 12/06/2017 0826   CO2 28 12/06/2017 0826   CO2 27 03/29/2017 1038   BUN 36 (H) 12/06/2017 0826   BUN 28.6 (H) 03/29/2017 1038   CREATININE 2.13 (H) 12/06/2017 0826   CREATININE 1.6 (H) 03/29/2017 1038      Component Value Date/Time   CALCIUM 9.3 12/06/2017 0826   CALCIUM 9.3 03/29/2017 1038   ALKPHOS 105 12/06/2017 0826   ALKPHOS 105 03/29/2017 1038   AST 17 12/06/2017 0826   AST 18 03/29/2017 1038   ALT 15 12/06/2017 0826   ALT 13 03/29/2017 1038   BILITOT 0.3 12/06/2017 0826   BILITOT 0.62 03/29/2017 1038       RADIOGRAPHIC STUDIES: No results found.  ASSESSMENT AND PLAN:  This is a very pleasant 67 years old African-American male with stage IIIA non-small cell lung cancer, adenocarcinoma.  The patient completed 6 weeks of concurrent chemoradiation with weekly carboplatin and paclitaxel and tolerated his treatment well except for odynophagia. The patient is currently on consolidation treatment with immunotherapy with Imfinzi (Durvalumab) status post 25 cycles.  He continues to tolerate this treatment well. I recommended for him to proceed with cycle #26 today. I will see him back for follow-up visit in 3 weeks for evaluation after repeating CT scan of the chest for restaging of his disease. The patient was advised to call immediately if he has any concerning symptoms in the interval. The patient voices understanding of current disease status and treatment options and is in  agreement with the current care plan. All questions were answered. The patient knows to call the clinic with any problems, questions or concerns. We can certainly see the patient much sooner if necessary.  Disclaimer: This note was dictated with voice recognition software. Similar sounding words can inadvertently be transcribed and may not be corrected upon review.

## 2017-12-20 NOTE — Progress Notes (Signed)
Okay to treat with today's labs per Dr. Earlie Server.

## 2017-12-20 NOTE — Telephone Encounter (Signed)
Patient aware of appts added - per 9/19 los - sent reminder letter in the mail with appt date and time.

## 2017-12-20 NOTE — Patient Instructions (Signed)
[facility] Discharge Instructions for Patients Receiving Chemotherapy  Today you received the following chemotherapy agents: Imfinzi.  To help prevent nausea and vomiting after your treatment, we encourage you to take your nausea medication as directed.   If you develop nausea and vomiting that is not controlled by your nausea medication, call the clinic.   BELOW ARE SYMPTOMS THAT SHOULD BE REPORTED IMMEDIATELY:  *FEVER GREATER THAN 100.5 F  *CHILLS WITH OR WITHOUT FEVER  NAUSEA AND VOMITING THAT IS NOT CONTROLLED WITH YOUR NAUSEA MEDICATION  *UNUSUAL SHORTNESS OF BREATH  *UNUSUAL BRUISING OR BLEEDING  TENDERNESS IN MOUTH AND THROAT WITH OR WITHOUT PRESENCE OF ULCERS  *URINARY PROBLEMS  *BOWEL PROBLEMS  UNUSUAL RASH Items with * indicate a potential emergency and should be followed up as soon as possible.  Feel free to call the clinic should you have any questions or concerns. The clinic phone number is [phone].  Please show the CHEMO ALERT CARD at check-in to the Emergency Department and triage nurse.   

## 2017-12-21 ENCOUNTER — Other Ambulatory Visit: Payer: Self-pay | Admitting: Internal Medicine

## 2018-01-01 LAB — CUP PACEART REMOTE DEVICE CHECK
Battery Remaining Percentage: 50 %
Battery Voltage: 2.96 V
Brady Statistic RV Percent Paced: 1 %
HIGH POWER IMPEDANCE MEASURED VALUE: 40 Ohm
HighPow Impedance: 40 Ohm
Implantable Lead Implant Date: 20140703
Implantable Lead Location: 753860
Lead Channel Impedance Value: 450 Ohm
Lead Channel Pacing Threshold Amplitude: 0.5 V
Lead Channel Pacing Threshold Pulse Width: 0.5 ms
Lead Channel Sensing Intrinsic Amplitude: 11.7 mV
Lead Channel Setting Pacing Pulse Width: 0.5 ms
MDC IDC MSMT BATTERY REMAINING LONGEVITY: 55 mo
MDC IDC PG IMPLANT DT: 20140703
MDC IDC SESS DTM: 20190909070326
MDC IDC SET LEADCHNL RV PACING AMPLITUDE: 2.5 V
MDC IDC SET LEADCHNL RV SENSING SENSITIVITY: 0.5 mV
Pulse Gen Serial Number: 1105031

## 2018-01-03 ENCOUNTER — Telehealth: Payer: Self-pay

## 2018-01-03 ENCOUNTER — Ambulatory Visit (INDEPENDENT_AMBULATORY_CARE_PROVIDER_SITE_OTHER): Payer: Medicare Other

## 2018-01-03 DIAGNOSIS — Z95810 Presence of automatic (implantable) cardiac defibrillator: Secondary | ICD-10-CM | POA: Diagnosis not present

## 2018-01-03 DIAGNOSIS — I5022 Chronic systolic (congestive) heart failure: Secondary | ICD-10-CM | POA: Diagnosis not present

## 2018-01-03 NOTE — Progress Notes (Signed)
EPIC Encounter for ICM Monitoring  Patient Name: Jason Russell is a 67 y.o. male Date: 01/03/2018 Primary Care Physican: Lucianne Lei, MD Primary Cardiologist:Ross Electrophysiologist: Faustino Congress Weight: 284lbs        Heart Failure questions reviewed, pt asymptomatic.  Patient started fishing vacation 9/29 through 10-1 and during that time he ate in restaurants a couple times a day and did not take fluid pills.     Thoracic impedance abnormal suggesting fluid accumulation starting 12/28/2017 but starting to trend toward baseline.   Patient was on vacation during decreased impedance, ate more salt and did not take fluid pills.   Prescribed: Furosemide 20 mg 3 tablets (60 mg total) every Tuesday and Saturday and 40 mg all the other days. Potassium 20 mEq 1 tablet every day and on days when lasix is taken, take 2 tablets  Labs: 11/08/2017 Creatinine 1.48, BUN 24, Potassium 3.5, Sodium 141, EGFR 48-55 10/25/2017 Creatinine 1.82, BUN 27, Potassium 3.6, Sodium 143, EGFR 37-43  10/11/2017 Creatinine 1.64, BUN 25, Potassium 4.2, Sodium 143, EGFR 42-49  09/27/2017 Creatinine 1.65, BUN 27, Potassium 3.6, Sodium 144, EGFR 42-49  09/13/2017 Creatinine 1.58, BUN 32, Potassium 3.9, Sodium 140, EGFR 44-51  08/30/2017 Creatinine 1.76, BUN 26, Potassium 3.9, Sodium 142, EGFR 39-45 08/16/2017 Creatinine 1.52, BUN 23, Potassium 4.0, Sodium 142, EGFR 46-54 08/02/2017 Creatinine 1.52, BUN 28, Potassium 3.7, Sodium 143, EGFR 46-54 07/19/2017 Creatinine 1.49, BUN 24, Potassium 4.0, Sodium 143, EGFR 48-55 07/05/2017 Creatinine1.65, BUN25, Potassium3.7, Sodium143, [id number] A complete set of results can be found in results review  Recommendations:  Since returning from vacation, patient is back on track with diet and taking Furosemide as prescribed.  Reinforced limiting salt intake to < 2000 mg daily and fluid intake to 64 oz daily.  Encouraged to call for fluid symptoms.  Follow-up plan: ICM clinic  phone appointment on 01/10/2018 (manual send) to recheck fluid levels.       Copy of ICM check sent to Dr. Caryl Comes and Dr Harrington Challenger for review and recommendations if needed.   3 month ICM trend: 01/03/2018    1 Year ICM trend:       [name], RN 01/03/2018 2:08 PM

## 2018-01-03 NOTE — Telephone Encounter (Signed)
Remote ICM transmission received.  Attempted call to patient and left detailed message, per DPR, to return call regarding transmission.

## 2018-01-09 ENCOUNTER — Inpatient Hospital Stay: Payer: Medicare Other | Attending: Internal Medicine

## 2018-01-09 ENCOUNTER — Ambulatory Visit (HOSPITAL_COMMUNITY)
Admission: RE | Admit: 2018-01-09 | Discharge: 2018-01-09 | Disposition: A | Discharge status: Home / Self Care | Payer: Medicare Other | Source: Ambulatory Visit | Attending: Internal Medicine | Admitting: Internal Medicine

## 2018-01-09 DIAGNOSIS — Z79899 Other long term (current) drug therapy: Secondary | ICD-10-CM | POA: Insufficient documentation

## 2018-01-09 DIAGNOSIS — Z923 Personal history of irradiation: Secondary | ICD-10-CM | POA: Diagnosis not present

## 2018-01-09 DIAGNOSIS — C3490 Malignant neoplasm of unspecified part of unspecified bronchus or lung: Secondary | ICD-10-CM | POA: Insufficient documentation

## 2018-01-09 DIAGNOSIS — E785 Hyperlipidemia, unspecified: Secondary | ICD-10-CM | POA: Diagnosis not present

## 2018-01-09 DIAGNOSIS — I4891 Unspecified atrial fibrillation: Secondary | ICD-10-CM | POA: Insufficient documentation

## 2018-01-09 DIAGNOSIS — I712 Thoracic aortic aneurysm, without rupture: Secondary | ICD-10-CM | POA: Insufficient documentation

## 2018-01-09 DIAGNOSIS — Z7901 Long term (current) use of anticoagulants: Secondary | ICD-10-CM | POA: Insufficient documentation

## 2018-01-09 DIAGNOSIS — I110 Hypertensive heart disease with heart failure: Secondary | ICD-10-CM | POA: Insufficient documentation

## 2018-01-09 DIAGNOSIS — J449 Chronic obstructive pulmonary disease, unspecified: Secondary | ICD-10-CM | POA: Diagnosis not present

## 2018-01-09 DIAGNOSIS — C3411 Malignant neoplasm of upper lobe, right bronchus or lung: Secondary | ICD-10-CM | POA: Diagnosis not present

## 2018-01-09 DIAGNOSIS — R5383 Other fatigue: Secondary | ICD-10-CM | POA: Diagnosis not present

## 2018-01-09 DIAGNOSIS — Z9989 Dependence on other enabling machines and devices: Secondary | ICD-10-CM | POA: Insufficient documentation

## 2018-01-09 DIAGNOSIS — I700 Atherosclerosis of aorta: Secondary | ICD-10-CM | POA: Insufficient documentation

## 2018-01-09 DIAGNOSIS — G4733 Obstructive sleep apnea (adult) (pediatric): Secondary | ICD-10-CM | POA: Diagnosis not present

## 2018-01-09 DIAGNOSIS — Z9225 Personal history of immunosupression therapy: Secondary | ICD-10-CM | POA: Insufficient documentation

## 2018-01-09 DIAGNOSIS — I5022 Chronic systolic (congestive) heart failure: Secondary | ICD-10-CM | POA: Diagnosis not present

## 2018-01-09 DIAGNOSIS — Z9221 Personal history of antineoplastic chemotherapy: Secondary | ICD-10-CM | POA: Insufficient documentation

## 2018-01-09 DIAGNOSIS — E039 Hypothyroidism, unspecified: Secondary | ICD-10-CM | POA: Insufficient documentation

## 2018-01-09 DIAGNOSIS — Z5112 Encounter for antineoplastic immunotherapy: Secondary | ICD-10-CM | POA: Diagnosis not present

## 2018-01-09 LAB — CBC WITH DIFFERENTIAL (CANCER CENTER ONLY)
Abs Immature Granulocytes: 0.02 10*3/uL (ref 0.00–0.07)
BASOS ABS: 0 10*3/uL (ref 0.0–0.1)
BASOS PCT: 0 %
EOS ABS: 0.1 10*3/uL (ref 0.0–0.5)
EOS PCT: 2 %
HCT: 36.3 % — ABNORMAL LOW (ref 39.0–52.0)
Hemoglobin: 11.8 g/dL — ABNORMAL LOW (ref 13.0–17.0)
Immature Granulocytes: 0 %
LYMPHS ABS: 0.6 10*3/uL — AB (ref 0.7–4.0)
Lymphocytes Relative: 12 %
MCH: 29.5 pg (ref 26.0–34.0)
MCHC: 32.5 g/dL (ref 30.0–36.0)
MCV: 90.8 fL (ref 80.0–100.0)
Monocytes Absolute: 0.8 10*3/uL (ref 0.1–1.0)
Monocytes Relative: 17 %
NRBC: 0 % (ref 0.0–0.2)
Neutro Abs: 3.2 10*3/uL (ref 1.7–7.7)
Neutrophils Relative %: 69 %
PLATELETS: 150 10*3/uL (ref 150–400)
RBC: 4 MIL/uL — AB (ref 4.22–5.81)
RDW: 14 % (ref 11.5–15.5)
WBC: 4.7 10*3/uL (ref 4.0–10.5)

## 2018-01-09 LAB — CMP (CANCER CENTER ONLY)
ALBUMIN: 3.3 g/dL — AB (ref 3.5–5.0)
ALK PHOS: 117 U/L (ref 38–126)
ALT: 13 U/L (ref 0–44)
AST: 16 U/L (ref 15–41)
Anion gap: 9 (ref 5–15)
BILIRUBIN TOTAL: 0.4 mg/dL (ref 0.3–1.2)
BUN: 30 mg/dL — ABNORMAL HIGH (ref 8–23)
CO2: 28 mmol/L (ref 22–32)
Calcium: 9.4 mg/dL (ref 8.9–10.3)
Chloride: 107 mmol/L (ref 98–111)
Creatinine: 1.95 mg/dL — ABNORMAL HIGH (ref 0.61–1.24)
GFR, Est AFR Am: 39 mL/min — ABNORMAL LOW (ref >60)
GFR, Estimated: 34 mL/min — ABNORMAL LOW (ref >60)
Glucose, Bld: 126 mg/dL — ABNORMAL HIGH (ref 70–99)
Potassium: 3.5 mmol/L (ref 3.5–5.1)
SODIUM: 144 mmol/L (ref 135–145)
Total Protein: 6.4 g/dL — ABNORMAL LOW (ref 6.5–8.1)

## 2018-01-09 MED ORDER — SODIUM CHLORIDE 0.9 % IJ SOLN
INTRAMUSCULAR | Status: AC
  Filled 2018-01-09: qty 50

## 2018-01-09 MED ORDER — HEPARIN SOD (PORK) LOCK FLUSH 100 UNIT/ML IV SOLN
INTRAVENOUS | Status: AC
  Filled 2018-01-09: qty 5

## 2018-01-09 MED ORDER — HEPARIN SOD (PORK) LOCK FLUSH 100 UNIT/ML IV SOLN
500.0000 [IU] | Freq: Once | INTRAVENOUS | Status: AC
  Administered 2018-01-09: 500 [IU] via INTRAVENOUS

## 2018-01-09 MED ORDER — IOHEXOL 300 MG/ML  SOLN
75.0000 mL | Freq: Once | INTRAMUSCULAR | Status: AC | PRN
  Administered 2018-01-09: 60 mL via INTRAVENOUS

## 2018-01-10 ENCOUNTER — Telehealth: Payer: Self-pay

## 2018-01-10 ENCOUNTER — Ambulatory Visit (INDEPENDENT_AMBULATORY_CARE_PROVIDER_SITE_OTHER): Payer: Medicare Other

## 2018-01-10 DIAGNOSIS — Z95810 Presence of automatic (implantable) cardiac defibrillator: Secondary | ICD-10-CM

## 2018-01-10 DIAGNOSIS — I5022 Chronic systolic (congestive) heart failure: Secondary | ICD-10-CM

## 2018-01-10 NOTE — Telephone Encounter (Signed)
LMOVM reminding pt to send remote transmission.   

## 2018-01-11 ENCOUNTER — Telehealth: Payer: Self-pay

## 2018-01-11 NOTE — Telephone Encounter (Signed)
Remote ICM transmission received.  Attempted call to patient and left detailed message, per DPR, regarding transmission and next ICM scheduled for 02/11/2018.  Advised to return call for any fluid symptoms or questions.

## 2018-01-11 NOTE — Progress Notes (Signed)
EPIC Encounter for ICM Monitoring  Patient Name: Jason Russell is a 67 y.o. male Date: 01/11/2018 Primary Care Physican: Lucianne Lei, MD Primary Cardiologist:Ross Electrophysiologist: Faustino Congress Weight:Previous weight 284lbs       Attempted call to patient and unable to reach.  Left detailed message, per DPR, regarding transmission.  Transmission reviewed.     Thoracic impedance returned to normal since last remote transmission on 01/03/2018.    Prescribed: Furosemide 20 mg 3 tablets (60 mg total) every Tuesday and Saturday and 40 mg all the other days. Potassium 20 mEq 1 tablet every day and on days when lasix is taken, take 2 tablets  Labs: 11/08/2017 Creatinine1.48, BUN24, Potassium3.5, Sodium141, VPCH40-35 10/25/2017 Creatinine1.82, BUN27, Potassium3.6, CYELYH909, PJPE16-24  10/11/2017 Creatinine1.64, BUN25, Potassium4.2, ECXFQH225, JDYN18-33  09/27/2017 Creatinine1.65, BUN27, Potassium3.6, POIPPG984, KJIZ12-81  09/13/2017 Creatinine1.58, BUN32, Potassium3.9, Sodium140, VWAQ77-37 08/30/2017 Creatinine 1.76, BUN 26, Potassium 3.9, Sodium 142, EGFR 39-45 08/16/2017 Creatinine 1.52, BUN 23, Potassium 4.0, Sodium 142, EGFR 46-54 08/02/2017 Creatinine 1.52, BUN 28, Potassium 3.7, Sodium 143, EGFR 46-54 07/19/2017 Creatinine 1.49, BUN 24, Potassium 4.0, Sodium 143, EGFR 48-55 07/05/2017 Creatinine1.65, BUN25, Potassium3.7, Sodium143, VGKK15-94 A complete set of results can be found in results review  Recommendations: Left voice mail with ICM number and encouraged to call if experiencing any fluid symptoms.  Follow-up plan: ICM clinic phone appointment on 02/11/2018.     Copy of ICM check sent to Dr. Caryl Comes.   3 month ICM trend: 01/10/2018    1 Year ICM trend:       [name], RN 01/11/2018 12:16 PM

## 2018-01-16 ENCOUNTER — Encounter: Payer: Self-pay | Admitting: Internal Medicine

## 2018-01-16 ENCOUNTER — Inpatient Hospital Stay (HOSPITAL_BASED_OUTPATIENT_CLINIC_OR_DEPARTMENT_OTHER): Payer: Medicare Other | Admitting: Internal Medicine

## 2018-01-16 VITALS — BP 147/84 | HR 83 | Temp 97.7°F | Resp 22 | Ht 73.0 in | Wt 291.0 lb

## 2018-01-16 DIAGNOSIS — Z923 Personal history of irradiation: Secondary | ICD-10-CM

## 2018-01-16 DIAGNOSIS — C3411 Malignant neoplasm of upper lobe, right bronchus or lung: Secondary | ICD-10-CM

## 2018-01-16 DIAGNOSIS — Z9989 Dependence on other enabling machines and devices: Secondary | ICD-10-CM

## 2018-01-16 DIAGNOSIS — J449 Chronic obstructive pulmonary disease, unspecified: Secondary | ICD-10-CM | POA: Diagnosis not present

## 2018-01-16 DIAGNOSIS — I5022 Chronic systolic (congestive) heart failure: Secondary | ICD-10-CM | POA: Diagnosis not present

## 2018-01-16 DIAGNOSIS — I4891 Unspecified atrial fibrillation: Secondary | ICD-10-CM | POA: Diagnosis not present

## 2018-01-16 DIAGNOSIS — E785 Hyperlipidemia, unspecified: Secondary | ICD-10-CM

## 2018-01-16 DIAGNOSIS — R5383 Other fatigue: Secondary | ICD-10-CM

## 2018-01-16 DIAGNOSIS — Z7901 Long term (current) use of anticoagulants: Secondary | ICD-10-CM | POA: Diagnosis not present

## 2018-01-16 DIAGNOSIS — J430 Unilateral pulmonary emphysema [MacLeod's syndrome]: Secondary | ICD-10-CM

## 2018-01-16 DIAGNOSIS — Z9221 Personal history of antineoplastic chemotherapy: Secondary | ICD-10-CM | POA: Diagnosis not present

## 2018-01-16 DIAGNOSIS — E039 Hypothyroidism, unspecified: Secondary | ICD-10-CM

## 2018-01-16 DIAGNOSIS — C3490 Malignant neoplasm of unspecified part of unspecified bronchus or lung: Secondary | ICD-10-CM

## 2018-01-16 DIAGNOSIS — Z9225 Personal history of immunosupression therapy: Secondary | ICD-10-CM

## 2018-01-16 DIAGNOSIS — I110 Hypertensive heart disease with heart failure: Secondary | ICD-10-CM

## 2018-01-16 DIAGNOSIS — I10 Essential (primary) hypertension: Secondary | ICD-10-CM

## 2018-01-16 DIAGNOSIS — Z79899 Other long term (current) drug therapy: Secondary | ICD-10-CM

## 2018-01-16 DIAGNOSIS — G4733 Obstructive sleep apnea (adult) (pediatric): Secondary | ICD-10-CM

## 2018-01-16 NOTE — Progress Notes (Signed)
[place] [phone]   [phone]  OFFICE PROGRESS NOTE  Lucianne Lei, MD [address] [place] [address]  DIAGNOSIS: Stage IIIA (T1a, N2, M0) non-small cell lung cancer, adenocarcinoma presented with right upper lobe lung nodule in addition to mediastinal lymphadenopathy diagnosed in March 2018.  Biomarker Findings Tumor Mutational Burden - TMB-Intermediate (8 Muts/Mb) Microsatellite Status - MS-Stable Genomic Findings For a complete list of the genes assayed, please refer to the Appendix. ERBB2 amplification - equivocal? DNMT3A K452f*192 FUBP1 Q40* KEAP1 [id number] TP53 C275F 7 Disease relevant genes with no reportable alterations: EGFR, KRAS, ALK, BRAF, MET, RET, ROS1   PRIOR THERAPY:  1) Course of concurrent chemoradiation with weekly carboplatin for AUC of 2 and paclitaxel 45 MG/M2. Status post 6 cycles. Last cycle was given 10/16/2016. 2)  Consolidation treatment with immunotherapy with Imfinzi (Durvalumab) 10 MG/M2 every 2 weeks. First dose 12/21/2016.  Status post 26 cycles.  CURRENT THERAPY: Observation.  INTERVAL HISTORY: Jason Karwowski[age] male returns to the clinic today for follow-up visit.  The patient is feeling fine today with no concerning complaints except for mild shortness of breath with exertion.  He completed a course of consolidation treatment with immunotherapy with Imfinzi (Durvalumab) for 26 cycles and tolerated his treatment fairly well.  He has no current chest pain, cough or hemoptysis.  He denied having any weight loss or night sweats.  He has no nausea, vomiting, diarrhea or constipation.  He denied having any significant weight loss or night sweats.  The patient had repeat CT scan of the chest performed recently and he is here for evaluation and discussion of his discuss results.  MEDICAL HISTORY: Past Medical History:  Diagnosis Date  . Adenocarcinoma of right lung, stage 3 (HCL[place]5/23/2018    . Adenocarcinoma of right lung, stage 3 (HCIatan5/23/2018  . Anemia   . Arthritis    "hx right hip"  . Asthma    "when I was a child"  . Atrial fibrillation (HCC)    Amiodarone started 10/2011; Coumadin  . 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 St. Jude ICD implantation 10/03/12.  . Chronic anticoagulation   . Chronic fatigue 10/18/2016  . Chronic fatigue 10/18/2016  . Chronic systolic heart failure (HCBer[facility]   a. Echo 7/13: EF 25%;  b. echo 04/2012:  Mild LVH, EF 30-35%, Gr 1 DD, mild AI, mild MR, mild LAE  . COPD (chronic obstructive pulmonary disease) (HCAzure  . Dyslipidemia   . Dysrhythmia   . Encounter for antineoplastic chemotherapy 08/24/2016  . Gout   . History of blood transfusion 10/15/2013   "don't know where the blood's going; HgB down to 5"  . Hyperlipidemia   . Hypertension   . Hypothyroidism   . NICM (nonischemic cardiomyopathy) (HCBig Cabin   LHCastalia/14:  minimal CAD  . Obesity   . OSA on CPAP   . Tobacco abuse     ALLERGIES:  has No Known Allergies.  MEDICATIONS:  Current Outpatient Medications  Medication Sig Dispense Refill  . acetaminophen (TYLENOL) 500 MG tablet Take 1,000 mg by mouth every 6 (six) hours as needed for mild pain.    . Marland Kitchenlbuterol (PROVENTIL HFA;VENTOLIN HFA) 108 (90 Base) MCG/ACT inhaler INHALE 2 PUFFS INTO THE LUNGS EVERY 4 (FOUR) HOURS AS NEEDED FOR WHEEZING OR SHORTNESS OF BREATH 8.5 Inhaler 2  . amiodarone (PACERONE) 100 MG tablet Take 1 tablet (100 mg total) by  mouth daily. 90 tablet 3  . atorvastatin (LIPITOR) 20 MG tablet TAKE 1 TABLET BY MOUTH EVERY DAY 90 tablet 2  . carvedilol (COREG) 12.5 MG tablet TAKE 1 TABLET (12.5 MG TOTAL) BY MOUTH 2 (TWO) TIMES DAILY. 180 tablet 1  . [employer] D3 2000 units CAPS Take 2,000 Units by mouth daily.   11  . feeding supplement, ENSURE ENLIVE, (ENSURE ENLIVE) LIQD Take 237 mLs by mouth 2 (two) times daily between meals. 237 mL 12  . ferrous gluconate (FERGON) 324 MG tablet Take 1 tablet  (324 mg total) by mouth 3 (three) times daily with meals. 90 tablet 8  . Fluticasone-Umeclidin-Vilant (TRELEGY ELLIPTA) 100-62.5-25 MCG/INH AEPB Inhale 1 puff into the lungs daily. 3 each 3  . furosemide (LASIX) 20 MG tablet TAKE 3 TABLETS (60 MG) EVERY TUE AND SAT, ALL OTHER DAYS TAKE 2 TABLETS (40 MG). 64 tablet 7  . hydrALAZINE (APRESOLINE) 100 MG tablet TAKE 1 TABLET BY MOUTH 3 (THREE) TIMES DAILY. 270 tablet 3  . levothyroxine (SYNTHROID, LEVOTHROID) 50 MCG tablet TAKE 1 TABLET BY MOUTH DAILY BEFORE BREAKFAST 90 tablet 3  . lidocaine-prilocaine (EMLA) cream APPLY GENEROUS AMOUNT TO PORT SITE AT LEAST 1 HR PRIOR TO TREATMENT. DO NOT RUB IN. 30 g 1  . lisinopril (PRINIVIL,ZESTRIL) 40 MG tablet Take 1 tablet (40 mg total) by mouth daily. 90 tablet 2  . magnesium oxide (MAG-OX) 400 (241.3 Mg) MG tablet Take 1 tablet (400 mg total) by mouth daily. 30 tablet 0  . potassium chloride SA (KLOR-CON M20) 20 MEQ tablet TAKE 1 TABLET BY MOUTH EVERY DAY *ON DAYS WHEN LASIX IS TAKEN, TAKE 2 TABLETS*  Dr. Harrington Challenger for further refills. 3rd attempt 30 tablet 0  . prochlorperazine (COMPAZINE) 10 MG tablet Take 1 tablet (10 mg total) by mouth every 6 (six) hours as needed for nausea or vomiting. (Patient not taking: Reported on 12/06/2017) 30 tablet 0  . rivaroxaban (XARELTO) 20 MG TABS tablet Take 1 tablet (20 mg total) by mouth daily. 90 tablet 2  . sildenafil (VIAGRA) 100 MG tablet Take 100 mg by mouth.    . spironolactone (ALDACTONE) 25 MG tablet Take 0.5 tablets (12.5 mg total) by mouth daily. 45 tablet 2  . sucralfate (CARAFATE) 1 g tablet TAKE 1 TABLET BY MOUTH FOUR TIMES DAILY (5 MINUTES BEFORE MEALS AND AT BEDTIME) FOR ESOPHAGITIS 120 tablet 0  . ULORIC 40 MG tablet Take 1 tablet (40 mg total) by mouth daily. 30 tablet 0   No current facility-administered medications for this visit.     SURGICAL HISTORY:  Past Surgical History:  Procedure Laterality Date  . CARDIAC DEFIBRILLATOR PLACEMENT  2014  .  CARDIOVERSION  2011  . COLONOSCOPY WITH PROPOFOL Left 10/17/2013   Procedure: COLONOSCOPY WITH PROPOFOL;  Surgeon: Inda Castle, MD;  Location: [facility];  Service: Endoscopy;  Laterality: Left;  . ESOPHAGOGASTRODUODENOSCOPY N/A 10/17/2013   Procedure: ESOPHAGOGASTRODUODENOSCOPY (EGD);  Surgeon: Inda Castle, MD;  Location: [facility];  Service: Endoscopy;  Laterality: N/A;  . GIVENS CAPSULE STUDY N/A 10/29/2013   Procedure: GIVENS CAPSULE STUDY;  Surgeon: Inda Castle, MD;  Location: [facility];  Service: Endoscopy;  Laterality: N/A;  . IMPLANTABLE CARDIOVERTER DEFIBRILLATOR IMPLANT Left 10/03/2012   Procedure: IMPLANTABLE CARDIOVERTER DEFIBRILLATOR IMPLANT;  Surgeon: Deboraha Sprang, MD;  Location: [facility] CATH LAB;  Service: Cardiovascular;  Laterality: Left;  . IR FLUORO GUIDE PORT INSERTION RIGHT  09/21/2016  . IR US GUIDE VASC ACCESS RIGHT  09/21/2016  .  JOINT REPLACEMENT    . TONSILLECTOMY  1950's  . TOTAL HIP ARTHROPLASTY Right 11/25/1997  . VIDEO BRONCHOSCOPY WITH ENDOBRONCHIAL ULTRASOUND N/A 08/09/2016   Procedure: VIDEO BRONCHOSCOPY WITH ENDOBRONCHIAL ULTRASOUND;  Surgeon: Javier Glazier, MD;  Location: [facility] OR;  Service: Thoracic;  Laterality: N/A;    REVIEW OF SYSTEMS:  Constitutional: positive for fatigue Eyes: negative Ears, nose, mouth, throat, and face: negative Respiratory: positive for dyspnea on exertion Cardiovascular: negative Gastrointestinal: negative Genitourinary:negative Integument/breast: negative Hematologic/lymphatic: negative Musculoskeletal:negative Neurological: negative Behavioral/Psych: negative Endocrine: negative Allergic/Immunologic: negative   PHYSICAL EXAMINATION: General appearance: alert, cooperative and no distress Head: Normocephalic, without obvious abnormality, atraumatic Neck: no adenopathy, no JVD, supple, symmetrical, trachea midline and thyroid not enlarged, symmetric, no tenderness/mass/nodules Lymph nodes: Cervical,  supraclavicular, and axillary nodes normal. Resp: clear to auscultation bilaterally Back: symmetric, no curvature. ROM normal. No CVA tenderness. Cardio: regular rate and rhythm, S1, S2 normal, no murmur, click, rub or gallop GI: soft, non-tender; bowel sounds normal; no masses,  no organomegaly Extremities: extremities normal, atraumatic, no cyanosis or edema Neurologic: Alert and oriented X 3, normal strength and tone. Normal symmetric reflexes. Normal coordination and gait  ECOG PERFORMANCE STATUS: 1 - Symptomatic but completely ambulatory  Blood pressure (!) 147/84, pulse 83, temperature 97.7 F (36.5 C), temperature source Oral, resp. rate (!) 22, height _0  (1.854 m), weight 291 lb (132 kg), SpO2 99 %.  LABORATORY DATA: Lab Results  Component Value Date   WBC 4.7 01/09/2018   HGB 11.8 (L) 01/09/2018   HCT 36.3 (L) 01/09/2018   MCV 90.8 01/09/2018   PLT 150 01/09/2018      Chemistry      Component Value Date/Time   NA 144 01/09/2018 1055   NA 141 03/29/2017 1038   K 3.5 01/09/2018 1055   K 4.0 03/29/2017 1038   CL 107 01/09/2018 1055   CO2 28 01/09/2018 1055   CO2 27 03/29/2017 1038   BUN 30 (H) 01/09/2018 1055   BUN 28.6 (H) 03/29/2017 1038   CREATININE 1.95 (H) 01/09/2018 1055   CREATININE 1.6 (H) 03/29/2017 1038      Component Value Date/Time   CALCIUM 9.4 01/09/2018 1055   CALCIUM 9.3 03/29/2017 1038   ALKPHOS 117 01/09/2018 1055   ALKPHOS 105 03/29/2017 1038   AST 16 01/09/2018 1055   AST 18 03/29/2017 1038   ALT 13 01/09/2018 1055   ALT 13 03/29/2017 1038   BILITOT 0.4 01/09/2018 1055   BILITOT 0.62 03/29/2017 1038       RADIOGRAPHIC STUDIES: Ct Chest W Contrast  Result Date: 01/09/2018 CLINICAL DATA:  Stage IIIA right upper lobe lung adenocarcinoma diagnosed March 2018 status post concurrent chemoradiation therapy with ongoing consolidation immunotherapy. Restaging. EXAM: CT CHEST WITH CONTRAST TECHNIQUE: Multidetector CT imaging of the chest was  performed during intravenous contrast administration. CONTRAST:  81m OMNIPAQUE IOHEXOL 300 MG/ML  SOLN COMPARISON:  09/07/2017 chest CT. FINDINGS: Cardiovascular: Borderline mild cardiomegaly. No significant pericardial effusion/thickening. Right internal jugular MediPort terminates in the lower third of the SVC. Stable configuration single lead left subclavian ICD with lead tip in the right ventricular apex. Atherosclerotic thoracic aorta with stable 4.5 cm proximal ascending thoracic aortic aneurysm. Stable top-normal main pulmonary artery (3.3 cm diameter). No central pulmonary emboli. Mediastinum/Nodes: No discrete thyroid nodules. Small amount of debris in the midthoracic esophagus. No pathologically enlarged axillary, mediastinal or hilar lymph nodes. Lungs/Pleura: No pneumothorax. No pleural effusion. Sharply marginated patchy consolidation in the paramediastinal mid to upper right  lung is stable and compatible with radiation fibrosis. Apical right upper lobe 4 mm subsolid pulmonary nodule (series 7/image 32) remain stable back to 07/11/2016 chest CT, considered benign. No acute consolidative airspace disease, lung masses or new significant pulmonary nodules. Upper abdomen: Nonobstructing right renal stones, largest 6 mm in the lower right kidney. Simple 1.4 cm interpolar left renal cyst. Musculoskeletal: No aggressive appearing focal osseous lesions. Moderate gynecomastia, asymmetric to the left, not appreciably changed. Moderate thoracic spondylosis. IMPRESSION: 1. 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 No evidence of local tumor recurrence. 2. No findings of recurrent metastatic disease in the chest. 3. Stable 4.5 cm proximal ascending thoracic aortic aneurysm. Ascending thoracic aortic aneurysm. Recommend semi-annual imaging followup by CTA or MRA and referral to cardiothoracic surgery if not already obtained. This recommendation follows 2010  ACCF/AHA/AATS/ACR/ASA/SCA/SCAI/SIR/STS/SVM Guidelines for the Diagnosis and Management of Patients With Thoracic Aortic Disease. Circulation. 2010; 121: P295-J884. 4. Borderline mild cardiomegaly. Aortic Atherosclerosis (ICD10-I70.0). Electronically Signed   By: Ilona Sorrel M.D.   On: 01/09/2018 15:09    ASSESSMENT AND PLAN:  This is a very pleasant 67 years old African-American male with stage IIIA non-small cell lung cancer, adenocarcinoma.  The patient completed 6 weeks of concurrent chemoradiation with weekly carboplatin and paclitaxel and tolerated his treatment well except for odynophagia. The patient was then started on consolidation treatment with immunotherapy with Imfinzi (Durvalumab) status post 26 cycles.  He tolerated this course of consolidation treatment with immunotherapy fairly well. He had a repeat CT scan of the chest performed recently.  I personally and independently reviewed the scan and discussed the results with the patient today.  His a scan showed no concerning findings for disease progression. I recommended for the patient to continue on observation for now.  I will see him back for follow-up visit in 3 months for evaluation with repeat CT scan of the chest for restaging of his disease. The patient was advised to call them immediately if he has any concerning symptoms in the interval. The patient voices understanding of current disease status and treatment options and is in agreement with the current care plan. All questions were answered. The patient knows to call the clinic with any problems, questions or concerns. We can certainly see the patient much sooner if necessary. I spent 15 minutes counseling the patient face to face. The total time spent in the appointment was 25 minutes. Disclaimer: This note was dictated with voice recognition software. Similar sounding words can inadvertently be transcribed and may not be corrected upon review.

## 2018-01-17 ENCOUNTER — Telehealth: Payer: Self-pay | Admitting: Internal Medicine

## 2018-01-17 NOTE — Telephone Encounter (Signed)
Scheduled appt per 10/16 los - sent reminder letter in the mail with appt date and time.

## 2018-01-28 ENCOUNTER — Other Ambulatory Visit: Payer: Self-pay | Admitting: Internal Medicine

## 2018-02-01 ENCOUNTER — Other Ambulatory Visit: Payer: Self-pay | Admitting: Internal Medicine

## 2018-02-11 ENCOUNTER — Telehealth: Payer: Self-pay

## 2018-02-11 ENCOUNTER — Ambulatory Visit (INDEPENDENT_AMBULATORY_CARE_PROVIDER_SITE_OTHER): Payer: Medicare Other

## 2018-02-11 DIAGNOSIS — Z95810 Presence of automatic (implantable) cardiac defibrillator: Secondary | ICD-10-CM | POA: Diagnosis not present

## 2018-02-11 DIAGNOSIS — I5022 Chronic systolic (congestive) heart failure: Secondary | ICD-10-CM

## 2018-02-11 NOTE — Telephone Encounter (Signed)
Remote ICM transmission received.  Attempted call to patient regarding ICM remote transmission and left detailed message, per DPR, with next ICM remote transmission date of 03/14/2018.  Advised to return call for any fluid symptoms or questions.

## 2018-02-11 NOTE — Progress Notes (Signed)
EPIC Encounter for ICM Monitoring  Patient Name: Jason Russell is a 67 y.o. male Date: 02/11/2018 Primary Care Physican: Lucianne Lei, MD Primary Cardiologist:Ross Electrophysiologist: Caryl Comes Last Weight: 284lbs Today's Weight: unknown      Attempted call to patient and unable to reach.  Left detailed message, per DPR, regarding transmission.  Transmission reviewed.    Thoracic impedance abnormal suggesting dryness starting 02/07/2018.   Prescribed: Furosemide 20 mg 3 tablets (60 mg total) every Tuesday and Saturday and 40 mg all the other days. Potassium 20 mEq 1 tablet every day and on days when lasix is taken, take 2 tablets  Labs: 01/09/2018 Creatinine 1.95, BUN 30, Potassium 3.5, Sodium 144, eGFR 34-39 12/20/2017 Creatinine 1.86, BUN 35, Potassium 3.6, Sodium 145, eGFR 36-42  12/06/2017 Creatinine 2.13, BUN 36, Potassium 3.4, Sodium 144, eGFR 31-35  11/22/2017 Creatinine 1.67, BUN 24, Potassium 3.4, Sodium 144, eGFR 41-48  11/08/2017 Creatinine1.48, BUN24, Potassium3.5, Sodium141, EUMP53-61 10/25/2017 Creatinine1.82, BUN27, Potassium3.6, WERXVQ008, QPYP95-09  10/11/2017 Creatinine1.64, BUN25, Potassium4.2, TOIZTI458, KDXI33-82  09/27/2017 Creatinine1.65, BUN27, Potassium3.6, NKNLZJ673, ALPF79-02  09/13/2017 Creatinine1.58, BUN32, Potassium3.9, Sodium140, IOXB35-32 08/30/2017 Creatinine 1.76, BUN 26, Potassium 3.9, Sodium 142, EGFR 39-45 08/16/2017 Creatinine 1.52, BUN 23, Potassium 4.0, Sodium 142, EGFR 46-54 08/02/2017 Creatinine 1.52, BUN 28, Potassium 3.7, Sodium 143, EGFR 46-54 07/19/2017 Creatinine 1.49, BUN 24, Potassium 4.0, Sodium 143, EGFR 48-55 07/05/2017 Creatinine1.65, BUN25, Potassium3.7, Sodium143, DJME26-83 A complete set of results can be found in results review  Recommendations: Left voice mail with ICM number and encouraged to call if experiencing any fluid symptoms.  Follow-up plan: ICM clinic phone appointment on  03/14/2018.   Office appointment scheduled 03/11/2018 with Dr. Harrington Challenger.    Copy of ICM check sent to Dr. Caryl Comes.   3 month ICM trend: 02/11/2018    1 Year ICM trend:       [name], RN 02/11/2018 10:06 AM

## 2018-02-12 ENCOUNTER — Observation Stay (HOSPITAL_COMMUNITY)
Admission: EM | Admit: 2018-02-12 | Discharge: 2018-02-13 | Disposition: A | Discharge status: Home / Self Care | Payer: Medicare Other | Attending: Cardiology | Admitting: Cardiology

## 2018-02-12 DIAGNOSIS — Z96641 Presence of right artificial hip joint: Secondary | ICD-10-CM | POA: Insufficient documentation

## 2018-02-12 DIAGNOSIS — Z87891 Personal history of nicotine dependence: Secondary | ICD-10-CM | POA: Diagnosis not present

## 2018-02-12 DIAGNOSIS — Z9221 Personal history of antineoplastic chemotherapy: Secondary | ICD-10-CM | POA: Diagnosis not present

## 2018-02-12 DIAGNOSIS — Z8249 Family history of ischemic heart disease and other diseases of the circulatory system: Secondary | ICD-10-CM | POA: Insufficient documentation

## 2018-02-12 DIAGNOSIS — E039 Hypothyroidism, unspecified: Secondary | ICD-10-CM | POA: Insufficient documentation

## 2018-02-12 DIAGNOSIS — Z45010 Encounter for checking and testing of cardiac pacemaker pulse generator [battery]: Secondary | ICD-10-CM | POA: Diagnosis not present

## 2018-02-12 DIAGNOSIS — T82118A Breakdown (mechanical) of other cardiac electronic device, initial encounter: Secondary | ICD-10-CM | POA: Diagnosis present

## 2018-02-12 DIAGNOSIS — J449 Chronic obstructive pulmonary disease, unspecified: Secondary | ICD-10-CM | POA: Diagnosis not present

## 2018-02-12 DIAGNOSIS — Z85118 Personal history of other malignant neoplasm of bronchus and lung: Secondary | ICD-10-CM | POA: Insufficient documentation

## 2018-02-12 DIAGNOSIS — E785 Hyperlipidemia, unspecified: Secondary | ICD-10-CM | POA: Diagnosis not present

## 2018-02-12 DIAGNOSIS — Z006 Encounter for examination for normal comparison and control in clinical research program: Secondary | ICD-10-CM | POA: Diagnosis not present

## 2018-02-12 DIAGNOSIS — I5022 Chronic systolic (congestive) heart failure: Secondary | ICD-10-CM | POA: Diagnosis not present

## 2018-02-12 DIAGNOSIS — Z950 Presence of cardiac pacemaker: Secondary | ICD-10-CM | POA: Diagnosis not present

## 2018-02-12 DIAGNOSIS — I428 Other cardiomyopathies: Secondary | ICD-10-CM | POA: Insufficient documentation

## 2018-02-12 DIAGNOSIS — T82198A Other mechanical complication of other cardiac electronic device, initial encounter: Secondary | ICD-10-CM

## 2018-02-12 DIAGNOSIS — G4733 Obstructive sleep apnea (adult) (pediatric): Secondary | ICD-10-CM | POA: Diagnosis not present

## 2018-02-12 DIAGNOSIS — I480 Paroxysmal atrial fibrillation: Secondary | ICD-10-CM | POA: Diagnosis not present

## 2018-02-12 DIAGNOSIS — Z79899 Other long term (current) drug therapy: Secondary | ICD-10-CM | POA: Diagnosis not present

## 2018-02-12 DIAGNOSIS — I110 Hypertensive heart disease with heart failure: Secondary | ICD-10-CM | POA: Insufficient documentation

## 2018-02-12 DIAGNOSIS — Z7901 Long term (current) use of anticoagulants: Secondary | ICD-10-CM | POA: Insufficient documentation

## 2018-02-12 DIAGNOSIS — J984 Other disorders of lung: Secondary | ICD-10-CM | POA: Diagnosis not present

## 2018-02-12 DIAGNOSIS — T829XXA Unspecified complication of cardiac and vascular prosthetic device, implant and graft, initial encounter: Secondary | ICD-10-CM | POA: Diagnosis present

## 2018-02-13 ENCOUNTER — Encounter (HOSPITAL_COMMUNITY)
Admission: EM | Disposition: A | Discharge status: Home / Self Care | Payer: Self-pay | Source: Home / Self Care | Attending: Emergency Medicine

## 2018-02-13 ENCOUNTER — Emergency Department (HOSPITAL_COMMUNITY): Payer: Medicare Other

## 2018-02-13 ENCOUNTER — Encounter (HOSPITAL_COMMUNITY): Payer: Self-pay

## 2018-02-13 ENCOUNTER — Other Ambulatory Visit: Payer: Self-pay | Admitting: Physician Assistant

## 2018-02-13 ENCOUNTER — Other Ambulatory Visit: Payer: Self-pay

## 2018-02-13 ENCOUNTER — Other Ambulatory Visit (HOSPITAL_COMMUNITY): Payer: Medicare HMO

## 2018-02-13 DIAGNOSIS — J984 Other disorders of lung: Secondary | ICD-10-CM | POA: Diagnosis not present

## 2018-02-13 DIAGNOSIS — E876 Hypokalemia: Secondary | ICD-10-CM

## 2018-02-13 DIAGNOSIS — I428 Other cardiomyopathies: Secondary | ICD-10-CM | POA: Diagnosis not present

## 2018-02-13 DIAGNOSIS — Z79899 Other long term (current) drug therapy: Secondary | ICD-10-CM

## 2018-02-13 DIAGNOSIS — T829XXA Unspecified complication of cardiac and vascular prosthetic device, implant and graft, initial encounter: Secondary | ICD-10-CM | POA: Diagnosis present

## 2018-02-13 DIAGNOSIS — Z45010 Encounter for checking and testing of cardiac pacemaker pulse generator [battery]: Secondary | ICD-10-CM | POA: Diagnosis not present

## 2018-02-13 DIAGNOSIS — I420 Dilated cardiomyopathy: Secondary | ICD-10-CM | POA: Diagnosis not present

## 2018-02-13 DIAGNOSIS — T82118A Breakdown (mechanical) of other cardiac electronic device, initial encounter: Secondary | ICD-10-CM | POA: Diagnosis present

## 2018-02-13 DIAGNOSIS — Z4502 Encounter for adjustment and management of automatic implantable cardiac defibrillator: Secondary | ICD-10-CM | POA: Diagnosis not present

## 2018-02-13 HISTORY — PX: ICD GENERATOR CHANGEOUT: EP1231

## 2018-02-13 LAB — BASIC METABOLIC PANEL
ANION GAP: 8 (ref 5–15)
BUN: 20 mg/dL (ref 8–23)
CALCIUM: 9.5 mg/dL (ref 8.9–10.3)
CO2: 28 mmol/L (ref 22–32)
Chloride: 104 mmol/L (ref 98–111)
Creatinine, Ser: 1.81 mg/dL — ABNORMAL HIGH (ref 0.61–1.24)
GFR, EST AFRICAN AMERICAN: 43 mL/min — AB (ref >60)
GFR, EST NON AFRICAN AMERICAN: 37 mL/min — AB (ref >60)
GLUCOSE: 99 mg/dL (ref 70–99)
POTASSIUM: 3 mmol/L — AB (ref 3.5–5.1)
Sodium: 140 mmol/L (ref 135–145)

## 2018-02-13 LAB — MAGNESIUM: Magnesium: 2 mg/dL (ref 1.7–2.4)

## 2018-02-13 LAB — CBC
HCT: 42.2 % (ref 39.0–52.0)
Hemoglobin: 13.3 g/dL (ref 13.0–17.0)
MCH: 28.4 pg (ref 26.0–34.0)
MCHC: 31.5 g/dL (ref 30.0–36.0)
MCV: 90 fL (ref 80.0–100.0)
NRBC: 0 % (ref 0.0–0.2)
Platelets: 174 10*3/uL (ref 150–400)
RBC: 4.69 MIL/uL (ref 4.22–5.81)
RDW: 14.4 % (ref 11.5–15.5)
WBC: 5.6 10*3/uL (ref 4.0–10.5)

## 2018-02-13 LAB — POTASSIUM
Potassium: 2.9 mmol/L — ABNORMAL LOW (ref 3.5–5.1)
Potassium: 3.7 mmol/L (ref 3.5–5.1)

## 2018-02-13 LAB — HIV ANTIBODY (ROUTINE TESTING W REFLEX): HIV Screen 4th Generation wRfx: NONREACTIVE

## 2018-02-13 LAB — TROPONIN I: Troponin I: 0.05 ng/mL (ref <0.03)

## 2018-02-13 LAB — SURGICAL PCR SCREEN
MRSA, PCR: NEGATIVE
STAPHYLOCOCCUS AUREUS: NEGATIVE

## 2018-02-13 SURGERY — ICD GENERATOR CHANGEOUT

## 2018-02-13 MED ORDER — LIDOCAINE HCL 1 % IJ SOLN
INTRAMUSCULAR | Status: AC
  Filled 2018-02-13: qty 60

## 2018-02-13 MED ORDER — FUROSEMIDE 40 MG PO TABS: 40.0000 mg | ORAL_TABLET | Freq: Every day | ORAL | 6 refills | Status: DC

## 2018-02-13 MED ORDER — SPIRONOLACTONE 25 MG PO TABS: 25.0000 mg | ORAL_TABLET | Freq: Every day | ORAL | 6 refills | Status: DC

## 2018-02-13 MED ORDER — CARVEDILOL 12.5 MG PO TABS
12.5000 mg | ORAL_TABLET | Freq: Two times a day (BID) | ORAL | Status: DC
  Administered 2018-02-13 (×2): 12.5 mg via ORAL
  Filled 2018-02-13 (×2): qty 1

## 2018-02-13 MED ORDER — SODIUM CHLORIDE 0.9 % IV SOLN
80.0000 mg | INTRAVENOUS | Status: AC
  Administered 2018-02-13: 80 mg
  Filled 2018-02-13: qty 2

## 2018-02-13 MED ORDER — ATORVASTATIN CALCIUM 20 MG PO TABS
20.0000 mg | ORAL_TABLET | Freq: Every day | ORAL | Status: DC
  Administered 2018-02-13: 20 mg via ORAL
  Filled 2018-02-13: qty 1

## 2018-02-13 MED ORDER — CHLORHEXIDINE GLUCONATE 4 % EX LIQD
CUTANEOUS | Status: AC
  Administered 2018-02-13: 11:00:00
  Filled 2018-02-13: qty 15

## 2018-02-13 MED ORDER — SODIUM CHLORIDE 0.9% FLUSH
3.0000 mL | Freq: Two times a day (BID) | INTRAVENOUS | Status: DC
  Administered 2018-02-13 (×2): 3 mL via INTRAVENOUS

## 2018-02-13 MED ORDER — SPIRONOLACTONE 12.5 MG HALF TABLET
12.5000 mg | ORAL_TABLET | Freq: Every day | ORAL | Status: DC
  Administered 2018-02-13: 12.5 mg via ORAL
  Filled 2018-02-13: qty 1

## 2018-02-13 MED ORDER — ONDANSETRON HCL 4 MG/2ML IJ SOLN: 4.0000 mg | Freq: Four times a day (QID) | INTRAMUSCULAR | Status: DC | PRN

## 2018-02-13 MED ORDER — FERROUS GLUCONATE 324 (38 FE) MG PO TABS
324.0000 mg | ORAL_TABLET | Freq: Three times a day (TID) | ORAL | Status: DC
  Administered 2018-02-13 (×3): 324 mg via ORAL
  Filled 2018-02-13 (×3): qty 1

## 2018-02-13 MED ORDER — SUCRALFATE 1 G PO TABS
1.0000 g | ORAL_TABLET | Freq: Three times a day (TID) | ORAL | Status: DC
  Administered 2018-02-13 (×3): 1 g via ORAL
  Filled 2018-02-13 (×3): qty 1

## 2018-02-13 MED ORDER — FUROSEMIDE 40 MG PO TABS
60.0000 mg | ORAL_TABLET | Freq: Every day | ORAL | Status: DC
  Administered 2018-02-13: 60 mg via ORAL
  Filled 2018-02-13: qty 1

## 2018-02-13 MED ORDER — SODIUM CHLORIDE 0.9 % IV SOLN
INTRAVENOUS | Status: DC
  Administered 2018-02-13: 12:00:00 via INTRAVENOUS

## 2018-02-13 MED ORDER — CHLORHEXIDINE GLUCONATE 4 % EX LIQD
60.0000 mL | Freq: Once | CUTANEOUS | Status: AC
  Administered 2018-02-13: 4 via TOPICAL

## 2018-02-13 MED ORDER — LIDOCAINE HCL (PF) 1 % IJ SOLN
INTRAMUSCULAR | Status: DC | PRN
  Administered 2018-02-13: 40 mL

## 2018-02-13 MED ORDER — POTASSIUM CHLORIDE ER 20 MEQ PO TBCR: 20.0000 meq | EXTENDED_RELEASE_TABLET | Freq: Every day | ORAL | 6 refills | Status: DC

## 2018-02-13 MED ORDER — UMECLIDINIUM BROMIDE 62.5 MCG/INH IN AEPB
1.0000 | INHALATION_SPRAY | Freq: Every day | RESPIRATORY_TRACT | Status: DC
  Administered 2018-02-13: 1 via RESPIRATORY_TRACT
  Filled 2018-02-13: qty 7

## 2018-02-13 MED ORDER — ACETAMINOPHEN 325 MG PO TABS: 650.0000 mg | ORAL_TABLET | ORAL | Status: DC | PRN

## 2018-02-13 MED ORDER — FLUTICASONE-UMECLIDIN-VILANT 100-62.5-25 MCG/INH IN AEPB: 1.0000 | INHALATION_SPRAY | Freq: Every day | RESPIRATORY_TRACT | Status: DC

## 2018-02-13 MED ORDER — POTASSIUM CHLORIDE CRYS ER 20 MEQ PO TBCR
40.0000 meq | EXTENDED_RELEASE_TABLET | ORAL | Status: AC
  Administered 2018-02-13 (×2): 40 meq via ORAL
  Filled 2018-02-13 (×2): qty 2

## 2018-02-13 MED ORDER — LISINOPRIL 40 MG PO TABS
40.0000 mg | ORAL_TABLET | Freq: Every day | ORAL | Status: DC
  Administered 2018-02-13: 40 mg via ORAL
  Filled 2018-02-13: qty 1

## 2018-02-13 MED ORDER — AMIODARONE HCL 100 MG PO TABS
100.0000 mg | ORAL_TABLET | Freq: Every day | ORAL | Status: DC
  Administered 2018-02-13: 100 mg via ORAL
  Filled 2018-02-13: qty 1

## 2018-02-13 MED ORDER — HYDRALAZINE HCL 50 MG PO TABS
100.0000 mg | ORAL_TABLET | Freq: Three times a day (TID) | ORAL | Status: DC
  Administered 2018-02-13 (×2): 100 mg via ORAL
  Filled 2018-02-13 (×2): qty 2

## 2018-02-13 MED ORDER — POTASSIUM CHLORIDE CRYS ER 20 MEQ PO TBCR
40.0000 meq | EXTENDED_RELEASE_TABLET | Freq: Once | ORAL | Status: AC
  Administered 2018-02-13: 40 meq via ORAL
  Filled 2018-02-13: qty 2

## 2018-02-13 MED ORDER — LEVOTHYROXINE SODIUM 50 MCG PO TABS
50.0000 ug | ORAL_TABLET | Freq: Every day | ORAL | Status: DC
  Administered 2018-02-13: 50 ug via ORAL
  Filled 2018-02-13: qty 1

## 2018-02-13 MED ORDER — DEXTROSE 5 % IV SOLN
3.0000 g | INTRAVENOUS | Status: AC
  Administered 2018-02-13: 3 g via INTRAVENOUS
  Filled 2018-02-13 (×3): qty 3000

## 2018-02-13 MED ORDER — SODIUM CHLORIDE 0.9 % IV SOLN
INTRAVENOUS | Status: AC
  Filled 2018-02-13: qty 2

## 2018-02-13 MED ORDER — FEBUXOSTAT 40 MG PO TABS
40.0000 mg | ORAL_TABLET | Freq: Every day | ORAL | Status: DC
  Administered 2018-02-13: 40 mg via ORAL
  Filled 2018-02-13: qty 1

## 2018-02-13 MED ORDER — FLUTICASONE FUROATE-VILANTEROL 100-25 MCG/INH IN AEPB
1.0000 | INHALATION_SPRAY | Freq: Every day | RESPIRATORY_TRACT | Status: DC
  Administered 2018-02-13: 1 via RESPIRATORY_TRACT
  Filled 2018-02-13: qty 28

## 2018-02-13 SURGICAL SUPPLY — 6 items
CABLE SURGICAL S-101-97-12 (CABLE) ×3 IMPLANT
DEVICE DISSECT PLASMABLAD 3.0S (MISCELLANEOUS) ×1 IMPLANT
ICD FORTIFY ASSU VR CD1357-40Q (ICD Generator) ×3 IMPLANT
PAD PRO RADIOLUCENT 2001M-C (PAD) ×3 IMPLANT
PLASMABLADE 3.0S (MISCELLANEOUS) ×3
TRAY PACEMAKER INSERTION (PACKS) ×3 IMPLANT

## 2018-02-13 NOTE — Consult Note (Addendum)
Cardiology Consultation:   Patient ID: Jason Russell MRN: [id number]; DOB: [birth year]  Admit date: 02/12/2018 Date of Consult: 02/13/2018  Primary Care Provider: Lucianne Lei, MD Primary Cardiologist: Dorris Carnes, MD  Primary Electrophysiologist:  Dr. Caryl Comes   Patient Profile:   Jason Russell is a 67 y.o. male with a hx of NICM w/ ICD, lung Ca tx w/ chemo/XRT (completed), COPD, HLD, HTN, hypothyroidism, obesity, OSA w/CPAP, and AF who is being seen today for the evaluation of ICD alert at the request of Dr. Radford Pax.  History of Present Illness:   Jason Russell was last seen by Dr. Caryl Comes in Aug this year, his ICD indication was primary prevention.  Device function was intact.  Mildly volume OL and his diuretic adjusted, PAF noted, on HVR, suspect to be AF  Device information: SJM single chamber ICD, implanted 10/03/12 Fortify Assura VR + h/o appropriate tx for VT in setting of electrolyte imbalance  [facility] report reviewed Battery performance alert detected 02/12/18 Last available lead measurements are good <1% VP, no episodes  LABS K+ 3.0 BUN/Creat 20/1.81 WBC 5.6 H/H 13/42 Plts 174  The patient feels well, denies any symptoms currently or of late.  No CP, palpitations, no SOB.  No shocks or syncope.   Past Medical History:  Diagnosis Date  . Adenocarcinoma of right lung, stage 3 ([place]) 08/23/2016  . Adenocarcinoma of right lung, stage 3 ([place]) 08/23/2016  . Anemia   . Arthritis    "hx right hip"  . Asthma    "when I was a child"  . Atrial fibrillation (HCC)    Amiodarone started 10/2011; Coumadin  . 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 St. Jude ICD implantation 10/03/12.  . Chronic anticoagulation   . Chronic fatigue 10/18/2016  . Chronic fatigue 10/18/2016  . Chronic systolic heart failure ([place])    a. Echo 7/13: EF 25%;  b. echo 04/2012:  Mild LVH, EF 30-35%, Gr 1 DD, mild AI, mild MR, mild LAE  . COPD (chronic  obstructive pulmonary disease) ([place])   . Dyslipidemia   . Dysrhythmia   . Encounter for antineoplastic chemotherapy 08/24/2016  . Gout   . History of blood transfusion 10/15/2013   "don't know where the blood's going; HgB down to 5"  . Hyperlipidemia   . Hypertension   . Hypothyroidism   . NICM (nonischemic cardiomyopathy) ([place])    [place] 4/14:  minimal CAD  . Obesity   . OSA on CPAP   . Tobacco abuse     Past Surgical History:  Procedure Laterality Date  . CARDIAC DEFIBRILLATOR PLACEMENT  2014  . CARDIOVERSION  2011  . COLONOSCOPY WITH PROPOFOL Left 10/17/2013   Procedure: COLONOSCOPY WITH PROPOFOL;  Surgeon: Inda Castle, MD;  Location: [place];  Service: Endoscopy;  Laterality: Left;  . ESOPHAGOGASTRODUODENOSCOPY N/A 10/17/2013   Procedure: ESOPHAGOGASTRODUODENOSCOPY (EGD);  Surgeon: Inda Castle, MD;  Location: [facility];  Service: Endoscopy;  Laterality: N/A;  . GIVENS CAPSULE STUDY N/A 10/29/2013   Procedure: GIVENS CAPSULE STUDY;  Surgeon: Inda Castle, MD;  Location: [facility];  Service: Endoscopy;  Laterality: N/A;  . IMPLANTABLE CARDIOVERTER DEFIBRILLATOR IMPLANT Left 10/03/2012   Procedure: IMPLANTABLE CARDIOVERTER DEFIBRILLATOR IMPLANT;  Surgeon: Deboraha Sprang, MD;  Location: [facility] CATH LAB;  Service: Cardiovascular;  Laterality: Left;  . IR FLUORO GUIDE PORT INSERTION RIGHT  09/21/2016  . IR US GUIDE VASC ACCESS RIGHT  09/21/2016  . JOINT REPLACEMENT    . TONSILLECTOMY  1950's  . TOTAL HIP ARTHROPLASTY Right 11/25/1997  . VIDEO BRONCHOSCOPY WITH ENDOBRONCHIAL ULTRASOUND N/A 08/09/2016   Procedure: VIDEO BRONCHOSCOPY WITH ENDOBRONCHIAL ULTRASOUND;  Surgeon: Javier Glazier, MD;  Location: [facility] OR;  Service: Thoracic;  Laterality: N/A;     Home Medications:  Prior to Admission medications   Medication Sig Start Date End Date Taking? Authorizing Provider  acetaminophen (TYLENOL) 500 MG tablet Take 1,000 mg by mouth every 6 (six) hours as needed for mild pain.    Yes [provider]  albuterol (PROVENTIL HFA;VENTOLIN HFA) 108 (90 Base) MCG/ACT inhaler INHALE 2 PUFFS INTO THE LUNGS EVERY 4 (FOUR) HOURS AS NEEDED FOR WHEEZING OR SHORTNESS OF BREATH 11/30/17  Yes Parrett, Tammy S, NP  amiodarone (PACERONE) 100 MG tablet Take 1 tablet (100 mg total) by mouth daily. 10/11/17  Yes Fay Records, MD  atorvastatin (LIPITOR) 20 MG tablet TAKE 1 TABLET BY MOUTH EVERY DAY Patient taking differently: Take 20 mg by mouth daily.  06/11/17  Yes Fay Records, MD  carvedilol (COREG) 12.5 MG tablet TAKE 1 TABLET (12.5 MG TOTAL) BY MOUTH 2 (TWO) TIMES DAILY. Patient taking differently: Take 12.5 mg by mouth 2 (two) times daily with a meal.  01/28/18  Yes Deboraha Sprang, MD  [employer] D3 2000 units CAPS Take 2,000 Units by mouth daily.  11/02/16  Yes [provider]  feeding supplement, ENSURE ENLIVE, (ENSURE ENLIVE) LIQD Take 237 mLs by mouth 2 (two) times daily between meals. 01/10/17  Yes Eulogio Bear U, DO  ferrous gluconate (FERGON) 324 MG tablet Take 1 tablet (324 mg total) by mouth 3 (three) times daily with meals. 08/06/17  Yes Deboraha Sprang, MD  Fluticasone-Umeclidin-Vilant (TRELEGY ELLIPTA) 100-62.5-25 MCG/INH AEPB Inhale 1 puff into the lungs daily. 08/17/17  Yes Parrett, Tammy S, NP  furosemide (LASIX) 20 MG tablet TAKE 3 TABLETS (60 MG) EVERY TUE AND SAT, ALL OTHER DAYS TAKE 2 TABLETS (40 MG). Patient taking differently: Take 40-60 mg by mouth See admin instructions. TAKE 3 TABLETS (60 MG) EVERY TUE AND SAT, ALL OTHER DAYS TAKE 2 TABLETS (40 MG). 02/01/18  Yes Fay Records, MD  hydrALAZINE (APRESOLINE) 100 MG tablet TAKE 1 TABLET BY MOUTH 3 (THREE) TIMES DAILY. Patient taking differently: Take 100 mg by mouth 3 (three) times daily. TAKE 1 TABLET BY MOUTH 3 (THREE) TIMES DAILY. 11/01/17  Yes Fay Records, MD  levothyroxine (SYNTHROID, LEVOTHROID) 50 MCG tablet TAKE 1 TABLET BY MOUTH DAILY BEFORE BREAKFAST Patient taking differently: Take 50 mcg by mouth daily  before breakfast.  10/24/17  Yes Fay Records, MD  lidocaine-prilocaine (EMLA) cream APPLY GENEROUS AMOUNT TO PORT SITE AT LEAST 1 HR PRIOR TO TREATMENT. DO NOT RUB IN. Patient taking differently: Apply 1 application topically See admin instructions. Apply generous amount to port site at least 1 hr prior to treatment.  DO NOT RUB IN. 11/21/17  Yes Curt Bears, MD  lisinopril (PRINIVIL,ZESTRIL) 40 MG tablet Take 1 tablet (40 mg total) by mouth daily. 12/21/17  Yes Fay Records, MD  magnesium oxide (MAG-OX) 400 (241.3 Mg) MG tablet Take 1 tablet (400 mg total) by mouth daily. 01/10/17  Yes Vann, Tomi Bamberger, DO  Polyvinyl Alcohol-Povidone (REFRESH OP) Place 1 drop into both eyes every morning.   Yes [provider]  potassium chloride SA (KLOR-CON M20) 20 MEQ tablet TAKE 1 TABLET BY MOUTH EVERY DAY *ON DAYS WHEN LASIX IS TAKEN, TAKE 2 TABLETS*  Dr. Harrington Challenger for further refills.  3rd attempt Patient taking differently: Take 20 mEq by mouth 2 (two) times daily.  06/06/17  Yes Fay Records, MD  rivaroxaban (XARELTO) 20 MG TABS tablet Take 1 tablet (20 mg total) by mouth daily. 10/12/17  Yes Parrett, Tammy S, NP  sildenafil (VIAGRA) 100 MG tablet Take 100 mg by mouth daily as needed for erectile dysfunction.    Yes [provider]  spironolactone (ALDACTONE) 25 MG tablet Take 0.5 tablets (12.5 mg total) by mouth daily. 11/30/17  Yes Fay Records, MD  sucralfate (CARAFATE) 1 g tablet TAKE 1 TABLET BY MOUTH FOUR TIMES DAILY (5 MINUTES BEFORE MEALS AND AT BEDTIME) FOR ESOPHAGITIS Patient taking differently: Take 1 g by mouth 3 (three) times daily before meals. DISOLVE 1 TABLET IN HOT WATER AND TAKE THREE TIMES DAILY (5 MINUTES BEFORE MEALS AND AT BEDTIME) FOR ESOPHAGITIS 08/16/17  Yes Curcio, Roselie Awkward, NP  ULORIC 40 MG tablet Take 1 tablet (40 mg total) by mouth daily. 10/17/16  Yes Deboraha Sprang, MD  prochlorperazine (COMPAZINE) 10 MG tablet Take 1 tablet (10 mg total) by mouth every 6 (six)  hours as needed for nausea or vomiting. Patient not taking: Reported on 12/06/2017 08/24/16   Curt Bears, MD    Inpatient Medications: Scheduled Meds: . amiodarone  100 mg Oral Daily  . atorvastatin  20 mg Oral Daily  . carvedilol  12.5 mg Oral BID WC  . febuxostat  40 mg Oral Daily  . ferrous gluconate  324 mg Oral TID WC  . fluticasone furoate-vilanterol  1 puff Inhalation Daily   And  . umeclidinium bromide  1 puff Inhalation Daily  . furosemide  60 mg Oral Daily  . hydrALAZINE  100 mg Oral Q8H  . levothyroxine  50 mcg Oral QAC breakfast  . lisinopril  40 mg Oral Daily  . sodium chloride flush  3 mL Intravenous Q12H  . spironolactone  12.5 mg Oral Daily  . sucralfate  1 g Oral TID AC   Continuous Infusions:  PRN Meds: acetaminophen  Allergies:   No Known Allergies  Social History:   Social History   Socioeconomic History  . Marital status: Married    Spouse name: Not on file  . Number of children: 1  . Years of education: Not on file  . Highest education level: Not on file  Occupational History  . Occupation: Manufacturing systems engineer: [employer]  Social Needs  . Financial resource strain: Not on file  . Food insecurity:    Worry: Not on file    Inability: Not on file  . Transportation needs:    Medical: Not on file    Non-medical: Not on file  Tobacco Use  . Smoking status: Former Smoker    Packs/day: 0.25    Years: 45.00    Pack years: 11.25    Types: Cigarettes    Last attempt to quit: 07/11/2016    Years since quitting: 1.5  . Smokeless tobacco: Never Used  Substance and Sexual Activity  . Alcohol use: Yes    Alcohol/week: 1.0 standard drinks    Types: 1 Cans of beer per week  . Drug use: No  . Sexual activity: Not Currently  Lifestyle  . Physical activity:    Days per week: Not on file    Minutes per session: Not on file  . Stress: Not on file  Relationships  . Social connections:    Talks on phone:  More than  three times a week    Gets together: Twice a week    Attends religious service: Patient refused    Active member of club or organization: Patient refused    Attends meetings of clubs or organizations: Patient refused    Relationship status: Married  . Intimate partner violence:    Fear of current or ex partner: Patient refused    Emotionally abused: Patient refused    Physically abused: Patient refused    Forced sexual activity: Patient refused  Other Topics Concern  . Not on file  Social History Narrative  . Not on file    Family History:   Family History  Problem Relation Age of Onset  . Hypertension Mother   . Heart Problems Mother   . Other Father        deceased- unknow reason  . Lung cancer Brother      ROS:  Please see the history of present illness.  All other ROS reviewed and negative.     Physical Exam/Data:   Vitals:   02/13/18 0207 02/13/18 0432 02/13/18 0800 02/13/18 0850  BP: (!) 157/82 135/82  (!) 152/81  Pulse: 79 67    Resp: 17 18  17   Temp: 98.2 F (36.8 C) 98.4 F (36.9 C)    TempSrc: Oral Oral    SpO2: 100% 100% 97%   Weight:      Height:        Intake/Output Summary (Last 24 hours) at 02/13/2018 0921 Last data filed at 02/13/2018 0848 Gross per 24 hour  Intake -  Output 1375 ml  Net -1375 ml   Filed Weights   02/13/18 0005 02/13/18 0200  Weight: 127 kg 129.5 kg   Body mass index is 37.65 kg/m.  General:  Well nourished, well developed, in no acute distress HEENT: normal Lymph: no adenopathy Neck: no JVD Endocrine:  No thryomegaly Vascular: No carotid bruits Cardiac:   RRR; no murmurs, gallops or rubs Lungs: CTA b/l, no wheezing, rhonchi or rales  Abd: soft, nontender Ext: no edema Musculoskeletal:  No deformities Skin: warm and dry  Neuro:  No focal abnormalities noted Psych:  Normal affect   EKG:  The EKG was personally reviewed and demonstrates:   SR, RBBB, LAD Telemetry:  Telemetry was personally reviewed and  demonstrates:   SR  Relevant CV Studies:  01/23/17: TTE Study Conclusions - Left ventricle: The cavity size was mildly to moderately dilated.   Wall thickness was normal. Systolic function was moderately   reduced. The estimated ejection fraction was in the range of 35%   to 40%. Diffuse hypokinesis. There was an increased relative   contribution of atrial contraction to ventricular filling.   Doppler parameters are consistent with high ventricular filling   pressure. - Aortic valve: There was mild regurgitation. Regurgitation   pressure half-time: 299 ms. - Mitral valve: There was mild regurgitation. - Left atrium: The atrium was mildly to moderately dilated.  Laboratory Data:  Chemistry Recent Labs  Lab 02/13/18 0010  NA 140  K 3.0*  CL 104  CO2 28  GLUCOSE 99  BUN 20  CREATININE 1.81*  CALCIUM 9.5  GFRNONAA 37*  GFRAA 43*  ANIONGAP 8    No results for input(s): PROT, ALBUMIN, AST, ALT, ALKPHOS, BILITOT in the last 168 hours. Hematology Recent Labs  Lab 02/13/18 0010  WBC 5.6  RBC 4.69  HGB 13.3  HCT 42.2  MCV 90.0  MCH 28.4  MCHC 31.5  RDW 14.4  PLT 174   Cardiac Enzymes Recent Labs  Lab 02/13/18 0010  TROPONINI 0.05*   No results for input(s): TROPIPOC in the last 168 hours.  BNPNo results for input(s): BNP, PROBNP in the last 168 hours.  DDimer No results for input(s): DDIMER in the last 168 hours.  Radiology/Studies:   Dg Chest 2 View Result Date: 02/13/2018 CLINICAL DATA:  Defibrillator vibrating, acute onset. Initial encounter. EXAM: CHEST - 2 VIEW COMPARISON:  CT of the chest performed 01/09/2018, and chest radiograph performed 09/06/2016 FINDINGS: The patient's right chest port is noted ending about the mid SVC. The lungs are well-aerated. Mild peribronchial thickening is noted. There is no evidence of focal opacification, pleural effusion or pneumothorax. The heart is normal in size; the mediastinal contour is within normal limits. An AICD  is noted at the left chest wall with a single lead ending at the right ventricle. The AICD is grossly unremarkable in appearance. No acute osseous abnormalities are seen. IMPRESSION: 1. Mild peribronchial thickening noted. Lungs otherwise clear. 2. AICD is grossly unremarkable in appearance. Electronically Signed   By: Garald Balding M.D.   On: 02/13/2018 01:20    Assessment and Plan:   1. ICD battery performance alert     + h/o appropriate therapy (VT) in the environment of electrolyte imbalance     On amiodarone     Primary prevention implant indication w/NICM  The patient  need generator change I have discussed the procedure, potential risks and benefots, he is agreeable to proceed  Dr. Curt Bears  see the patient later this AM  2. Hypokalemia     Ordered a dose of K+ and a recheck on lab     He has had issues with hypokalemia in the past, may need supplementation at home despite his aldactone   3. Paroxysmal AFib     CHA2DS2Vasc is 3, on Xarelto     Hold today for gen change  4. HTN     Home meds  5. NICM     No exam evidence of fluid OL   For questions or updates, please contact [place] Please consult [email or web address] for contact info under     Signed, Baldwin Jamaica, PA-C  02/13/2018 9:21 AM;lt  I have seen and examined this patient with Tommye Standard.  Agree with above, note added to reflect my findings.  On exam, RRR, no murmurs, lungs clear. Presented due to ICD vibrating. Has battery depletion. As he has had an ICD shock, plan for generator change.     M.  MD 02/13/2018 3:34 PM

## 2018-02-13 NOTE — H&P (Signed)
Cardiology Admission History and Physical:   Patient ID: Jason Russell MRN: [id number]; DOB: [birth year]   Admission date: 02/12/2018  Primary Care Provider: Lucianne Lei, MD Primary Cardiologist: Dorris Carnes, MD  Primary Electrophysiologist:  Virl Axe, MD  Chief Complaint:  Defibrillator "Buzzing"  Patient Profile:   Jason Russell is a 67 y.o. male with NICM (LVEF 35-40%) s/p primary prevention ICD, pAF on [facility], and lung ca s/p chemo XRT who presents with buzzing of his AICD.   History of Present Illness:   Patient in his usual state of health up until yesterday. Noted one episode of "buzzing" from his defibrillator (no shocks). It recurred this evening so he decided to come in for evaluation. [employer] rep interrogated device and stated that the vibration was related to a device recall and the battery needing to be changed imminently. She recommended admission for battery replacement.   On review of systems, patient denies palpitations, syncope/pre-syncope, lightheadedness or dizziness. He feels he is a bit volume up, but took an extra dose of lasix this evening as a result.    Past Medical History:  Diagnosis Date  . Adenocarcinoma of right lung, stage 3 ([place]) 08/23/2016  . Adenocarcinoma of right lung, stage 3 ([place]) 08/23/2016  . Anemia   . Arthritis    "hx right hip"  . Asthma    "when I was a child"  . Atrial fibrillation (HCC)    Amiodarone started 10/2011; Coumadin  . 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 [employer] Jude ICD implantation 10/03/12.  . Chronic anticoagulation   . Chronic fatigue 10/18/2016  . Chronic fatigue 10/18/2016  . Chronic systolic heart failure ([place])    a. Echo 7/13: EF 25%;  b. echo 04/2012:  Mild LVH, EF 30-35%, Gr 1 DD, mild AI, mild MR, mild LAE  . COPD (chronic obstructive pulmonary disease) ([place])   . Dyslipidemia   . Dysrhythmia   . Encounter for antineoplastic chemotherapy 08/24/2016  . Gout   . History of  blood transfusion 10/15/2013   "don't know where the blood's going; HgB down to 5"  . Hyperlipidemia   . Hypertension   . Hypothyroidism   . NICM (nonischemic cardiomyopathy) ([place])    [place] 4/14:  minimal CAD  . Obesity   . OSA on CPAP   . Tobacco abuse     Past Surgical History:  Procedure Laterality Date  . CARDIAC DEFIBRILLATOR PLACEMENT  2014  . CARDIOVERSION  2011  . COLONOSCOPY WITH PROPOFOL Left 10/17/2013   Procedure: COLONOSCOPY WITH PROPOFOL;  Surgeon: Inda Castle, MD;  Location: [place];  Service: Endoscopy;  Laterality: Left;  . ESOPHAGOGASTRODUODENOSCOPY N/A 10/17/2013   Procedure: ESOPHAGOGASTRODUODENOSCOPY (EGD);  Surgeon: Inda Castle, MD;  Location: [place];  Service: Endoscopy;  Laterality: N/A;  . GIVENS CAPSULE STUDY N/A 10/29/2013   Procedure: GIVENS CAPSULE STUDY;  Surgeon: Inda Castle, MD;  Location: [facility] ENDOSCOPY;  Service: Endoscopy;  Laterality: N/A;  . IMPLANTABLE CARDIOVERTER DEFIBRILLATOR IMPLANT Left 10/03/2012   Procedure: IMPLANTABLE CARDIOVERTER DEFIBRILLATOR IMPLANT;  Surgeon: Deboraha Sprang, MD;  Location: [facility] CATH LAB;  Service: Cardiovascular;  Laterality: Left;  . IR FLUORO GUIDE PORT INSERTION RIGHT  09/21/2016  . IR US GUIDE VASC ACCESS RIGHT  09/21/2016  . JOINT REPLACEMENT    . TONSILLECTOMY  1950's  . TOTAL HIP ARTHROPLASTY Right 11/25/1997  . VIDEO BRONCHOSCOPY WITH ENDOBRONCHIAL ULTRASOUND N/A 08/09/2016   Procedure: VIDEO BRONCHOSCOPY WITH ENDOBRONCHIAL ULTRASOUND;  Surgeon: Javier Glazier,  MD;  Location: [facility] OR;  Service: Thoracic;  Laterality: N/A;     Medications Prior to Admission: Prior to Admission medications   Medication Sig Start Date End Date Taking? Authorizing Provider  acetaminophen (TYLENOL) 500 MG tablet Take 1,000 mg by mouth every 6 (six) hours as needed for mild pain.   Yes [provider]  albuterol (PROVENTIL HFA;VENTOLIN HFA) 108 (90 Base) MCG/ACT inhaler INHALE 2 PUFFS INTO THE LUNGS EVERY 4  (FOUR) HOURS AS NEEDED FOR WHEEZING OR SHORTNESS OF BREATH 11/30/17  Yes Parrett, Tammy S, NP  amiodarone (PACERONE) 100 MG tablet Take 1 tablet (100 mg total) by mouth daily. 10/11/17  Yes Fay Records, MD  atorvastatin (LIPITOR) 20 MG tablet TAKE 1 TABLET BY MOUTH EVERY DAY Patient taking differently: Take 20 mg by mouth daily.  06/11/17  Yes Fay Records, MD  carvedilol (COREG) 12.5 MG tablet TAKE 1 TABLET (12.5 MG TOTAL) BY MOUTH 2 (TWO) TIMES DAILY. Patient taking differently: Take 12.5 mg by mouth 2 (two) times daily with a meal.  01/28/18  Yes Deboraha Sprang, MD  [employer] D3 2000 units CAPS Take 2,000 Units by mouth daily.  11/02/16  Yes [provider]  feeding supplement, ENSURE ENLIVE, (ENSURE ENLIVE) LIQD Take 237 mLs by mouth 2 (two) times daily between meals. 01/10/17  Yes Eulogio Bear U, DO  ferrous gluconate (FERGON) 324 MG tablet Take 1 tablet (324 mg total) by mouth 3 (three) times daily with meals. 08/06/17  Yes Deboraha Sprang, MD  Fluticasone-Umeclidin-Vilant (TRELEGY ELLIPTA) 100-62.5-25 MCG/INH AEPB Inhale 1 puff into the lungs daily. 08/17/17  Yes Parrett, Tammy S, NP  furosemide (LASIX) 20 MG tablet TAKE 3 TABLETS (60 MG) EVERY TUE AND SAT, ALL OTHER DAYS TAKE 2 TABLETS (40 MG). Patient taking differently: Take 40-60 mg by mouth See admin instructions. TAKE 3 TABLETS (60 MG) EVERY TUE AND SAT, ALL OTHER DAYS TAKE 2 TABLETS (40 MG). 02/01/18  Yes Fay Records, MD  hydrALAZINE (APRESOLINE) 100 MG tablet TAKE 1 TABLET BY MOUTH 3 (THREE) TIMES DAILY. Patient taking differently: Take 100 mg by mouth 3 (three) times daily. TAKE 1 TABLET BY MOUTH 3 (THREE) TIMES DAILY. 11/01/17  Yes Fay Records, MD  levothyroxine (SYNTHROID, LEVOTHROID) 50 MCG tablet TAKE 1 TABLET BY MOUTH DAILY BEFORE BREAKFAST Patient taking differently: Take 50 mcg by mouth daily before breakfast.  10/24/17  Yes Fay Records, MD  lidocaine-prilocaine (EMLA) cream APPLY GENEROUS AMOUNT TO PORT SITE AT LEAST 1 HR  PRIOR TO TREATMENT. DO NOT RUB IN. Patient taking differently: Apply 1 application topically See admin instructions. Apply generous amount to port site at least 1 hr prior to treatment.  DO NOT RUB IN. 11/21/17  Yes Curt Bears, MD  lisinopril (PRINIVIL,ZESTRIL) 40 MG tablet Take 1 tablet (40 mg total) by mouth daily. 12/21/17  Yes Fay Records, MD  magnesium oxide (MAG-OX) 400 (241.3 Mg) MG tablet Take 1 tablet (400 mg total) by mouth daily. 01/10/17  Yes Vann, Tomi Bamberger, DO  Polyvinyl Alcohol-Povidone (REFRESH OP) Place 1 drop into both eyes every morning.   Yes [provider]  potassium chloride SA (KLOR-CON M20) 20 MEQ tablet TAKE 1 TABLET BY MOUTH EVERY DAY *ON DAYS WHEN LASIX IS TAKEN, TAKE 2 TABLETS*  Dr. Harrington Challenger for further refills. 3rd attempt Patient taking differently: Take 20 mEq by mouth 2 (two) times daily.  06/06/17  Yes Fay Records, MD  rivaroxaban (XARELTO) 20 MG TABS tablet  Take 1 tablet (20 mg total) by mouth daily. 10/12/17  Yes Parrett, Tammy S, NP  sildenafil (VIAGRA) 100 MG tablet Take 100 mg by mouth daily as needed for erectile dysfunction.    Yes [provider]  spironolactone (ALDACTONE) 25 MG tablet Take 0.5 tablets (12.5 mg total) by mouth daily. 11/30/17  Yes Fay Records, MD  sucralfate (CARAFATE) 1 g tablet TAKE 1 TABLET BY MOUTH FOUR TIMES DAILY (5 MINUTES BEFORE MEALS AND AT BEDTIME) FOR ESOPHAGITIS Patient taking differently: Take 1 g by mouth 3 (three) times daily before meals. DISOLVE 1 TABLET IN HOT WATER AND TAKE THREE TIMES DAILY (5 MINUTES BEFORE MEALS AND AT BEDTIME) FOR ESOPHAGITIS 08/16/17  Yes Curcio, Roselie Awkward, NP  ULORIC 40 MG tablet Take 1 tablet (40 mg total) by mouth daily. 10/17/16  Yes Deboraha Sprang, MD  prochlorperazine (COMPAZINE) 10 MG tablet Take 1 tablet (10 mg total) by mouth every 6 (six) hours as needed for nausea or vomiting. Patient not taking: Reported on 12/06/2017 08/24/16   Curt Bears, MD     Allergies:   No  Known Allergies  Social History:   Social History   Socioeconomic History  . Marital status: Married    Spouse name: Not on file  . Number of children: 1  . Years of education: Not on file  . Highest education level: Not on file  Occupational History  . Occupation: Manufacturing systems engineer: [employer]  Social Needs  . Financial resource strain: Not on file  . Food insecurity:    Worry: Not on file    Inability: Not on file  . Transportation needs:    Medical: Not on file    Non-medical: Not on file  Tobacco Use  . Smoking status: Former Smoker    Packs/day: 0.25    Years: 45.00    Pack years: 11.25    Types: Cigarettes    Last attempt to quit: 07/11/2016    Years since quitting: 1.5  . Smokeless tobacco: Never Used  Substance and Sexual Activity  . Alcohol use: Yes    Alcohol/week: 1.0 standard drinks    Types: 1 Cans of beer per week  . Drug use: No  . Sexual activity: Not Currently  Lifestyle  . Physical activity:    Days per week: Not on file    Minutes per session: Not on file  . Stress: Not on file  Relationships  . Social connections:    Talks on phone: Not on file    Gets together: Not on file    Attends religious service: Not on file    Active member of club or organization: Not on file    Attends meetings of clubs or organizations: Not on file    Relationship status: Not on file  . Intimate partner violence:    Fear of current or ex partner: Not on file    Emotionally abused: Not on file    Physically abused: Not on file    Forced sexual activity: Not on file  Other Topics Concern  . Not on file  Social History Narrative  . Not on file    Family History:   The patient's family history includes Heart Problems in his mother; Hypertension in his mother; Lung cancer in his brother; Other in his father.    Review of Systems: [y] = yes, [ ]  = no     General: Weight gain [ ] ;  Weight loss [ ] ; Anorexia [ ] ; Fatigue [ ] ; Fever  [ ] ; Chills [ ] ; Weakness [ ]    Cardiac: Chest pain/pressure [ ] ; Resting SOB [ ] ; Exertional SOB [ ] ; Orthopnea [ ] ; Pedal Edema [ ] ; Palpitations [ ] ; Syncope [ ] ; Presyncope [ ] ; Paroxysmal nocturnal dyspnea[ ]    Pulmonary: Cough [ ] ; Wheezing[ ] ; Hemoptysis[ ] ; Sputum [ ] ; Snoring [ ]    GI: Vomiting[ ] ; Dysphagia[ ] ; Melena[ ] ; Hematochezia [ ] ; Heartburn[ ] ; Abdominal pain [ ] ; Constipation [ ] ; Diarrhea [ ] ; BRBPR [ ]    GU: Hematuria[ ] ; Dysuria [ ] ; Nocturia[ ]    Vascular: Pain in legs with walking [ ] ; Pain in feet with lying flat [ ] ; Non-healing sores [ ] ; Stroke [ ] ; TIA [ ] ; Slurred speech [ ] ;   Neuro: Headaches[ ] ; Vertigo[ ] ; Seizures[ ] ; Paresthesias[ ] ;Blurred vision [ ] ; Diplopia [ ] ; Vision changes [ ]    Ortho/Skin: Arthritis [ ] ; Joint pain [ ] ; Muscle pain [ ] ; Joint swelling [ ] ; Back Pain [ ] ; Rash [ ]    Psych: Depression[ ] ; Anxiety[ ]    Heme: Bleeding problems [ ] ; Clotting disorders [ ] ; Anemia [ ]    Endocrine: Diabetes [ ] ; Thyroid dysfunction[ ]   Physical Exam/Data:   Vitals:   02/13/18 0005 02/13/18 0010 02/13/18 0045  BP:  (!) 173/95 (!) 146/86  Pulse:  86 75  Resp:  18 17  Temp:  98.4 F (36.9 C)   TempSrc:  Oral   SpO2:  100% 93%  Weight: 127 kg    Height: 6\' 1"  (1.854 m)     No intake or output data in the 24 hours ending 02/13/18 0227 Filed Weights   02/13/18 0005  Weight: 127 kg   Body mass index is 36.94 kg/m.  General:  Well nourished, well developed, in no acute distress HEENT: normal Lymph: no adenopathy Neck: JVD to mid neck at 45 degrees.  Endocrine:  No thryomegaly Vascular: No carotid bruits; FA pulses 2+ bilaterally without bruits  Cardiac:  normal S1, S2; RRR; no murmur.  Lungs:  clear to auscultation bilaterally, no wheezing, rhonchi or rales  Abd: soft, nontender, no hepatomegaly  Ext: 1+ edema.  Musculoskeletal:  No deformities, BUE and BLE strength normal and equal Skin: warm and dry  Neuro:  CNs 2-12 intact, no  focal abnormalities noted Psych:  Normal affect    EKG:  The ECG that was done was reviewed and reveals SR with PACs and prolonged QTc.   Relevant CV Studies: TTE 01/2017: Study Conclusions - Left ventricle: The cavity size was mildly to moderately dilated.   Wall thickness was normal. Systolic function was moderately   reduced. The estimated ejection fraction was in the range of 35%   to 40%. Diffuse hypokinesis. There was an increased relative   contribution of atrial contraction to ventricular filling.   Doppler parameters are consistent with high ventricular filling   pressure. - Aortic valve: There was mild regurgitation. Regurgitation   pressure half-time: 299 ms. - Mitral valve: There was mild regurgitation. - Left atrium: The atrium was mildly to moderately dilated.  Laboratory Data:  Chemistry Recent Labs  Lab 02/13/18 0010  NA 140  K 3.0*  CL 104  CO2 28  GLUCOSE 99  BUN 20  CREATININE 1.81*  CALCIUM 9.5  GFRNONAA 37*  GFRAA 43*  ANIONGAP 8    No results for input(s): PROT, ALBUMIN, AST, ALT, ALKPHOS, BILITOT in the last 168  hours. Hematology Recent Labs  Lab 02/13/18 0010  WBC 5.6  RBC 4.69  HGB 13.3  HCT 42.2  MCV 90.0  MCH 28.4  MCHC 31.5  RDW 14.4  PLT 174   Cardiac Enzymes Recent Labs  Lab 02/13/18 0010  TROPONINI 0.05*   No results for input(s): TROPIPOC in the last 168 hours.  BNPNo results for input(s): BNP, PROBNP in the last 168 hours.  DDimer No results for input(s): DDIMER in the last 168 hours.  Radiology/Studies:  Dg Chest 2 View  Result Date: 02/13/2018 CLINICAL DATA:  Defibrillator vibrating, acute onset. Initial encounter. EXAM: CHEST - 2 VIEW COMPARISON:  CT of the chest performed 01/09/2018, and chest radiograph performed 09/06/2016 FINDINGS: The patient's right chest port is noted ending about the mid SVC. The lungs are well-aerated. Mild peribronchial thickening is noted. There is no evidence of focal opacification,  pleural effusion or pneumothorax. The heart is normal in size; the mediastinal contour is within normal limits. An AICD is noted at the left chest wall with a single lead ending at the right ventricle. The AICD is grossly unremarkable in appearance. No acute osseous abnormalities are seen. IMPRESSION: 1. Mild peribronchial thickening noted. Lungs otherwise clear. 2. AICD is grossly unremarkable in appearance. Electronically Signed   By: Garald Balding M.D.   On: 02/13/2018 01:20    Assessment and Plan:   Jason Russell is a 67 year old man who is s/p AICD for NICM and presents with "buzzing" of his defibrillator deemed by [employer] rep to warrant admission for generator change.   #s/p AICD with ?Recall -- NPO @ [place] -- EP consult in the AM with formal device interrogation -- Hold rivaroxaban  #NICM (EF 35%), Chronic, Compensated -- Continue GDMT including coreg, spironolactone, lisinopril -- Lasix 60mg  PO daily until euvolemic -- BID BMP for lyte repletions -- Daily weights, strict I/Os.   Severity of Illness: The appropriate patient status for this patient is OBSERVATION. Observation status is judged to be reasonable and necessary in order to provide the required intensity of service to ensure the patient's safety. The patient's presenting symptoms, physical exam findings, and initial radiographic and laboratory data in the context of their medical condition is felt to place them at decreased risk for further clinical deterioration. Furthermore, it is anticipated that the patient will be medically stable for discharge from the hospital within 2 midnights of admission. The following factors support the patient status of observation.   " The patient's presenting symptoms include defibrillator malfunction. " The physical exam findings include n/a " The initial radiographic and laboratory data are reassuring.    For questions or updates, please contact [place] Please consult [email or web address]  for contact info under    Signed, Milus Banister, MD  02/13/2018 2:27 AM

## 2018-02-13 NOTE — ED Provider Notes (Signed)
Medical screening examination/treatment/procedure(s) were conducted as a shared visit with non-physician practitioner(s) and myself.  I personally evaluated the patient during the encounter.  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  Did not receive any shocks.  No chest pain, shortness of breath, palpitations or dizziness.  Pacemaker interrogated and these alerts were secondary to the manufacturer recall.  They have recommended cardiology admission.  Discussed with fellow who agrees to admit patient for observation.   Samhitha Rosen, Delice Bison, DO 02/13/18 [phone]

## 2018-02-13 NOTE — Progress Notes (Signed)
ICD Criteria  Current LVEF:35-40%. Within 12 months prior to implant: Yes   Heart failure history: Yes, Class II  Cardiomyopathy history: Yes, Ischemic Cardiomyopathy - Prior MI.  Atrial Fibrillation/Atrial Flutter: No.  Ventricular tachycardia history: Yes, Hemodynamic instability present. VT Type: Sustained Ventricular Tachycardia - Monomorphic.  Cardiac arrest history: Yes, Ventricular Tachycardia.  History of syndromes with risk of sudden death: No.  Previous ICD: Yes, Reason for ICD:  Primary prevention.  Current ICD indication: Secondary  PPM indication: No.  Class I or II Bradycardia indication present: No  Beta Blocker therapy for 3 or more months: Yes, prescribed.   Ace Inhibitor/ARB therapy for 3 or more months: Yes, prescribed.    I have seen Jason Russell is a 67 y.o. malepre-procedural and has been referred by Radford Pax for consideration of ICD implant for secondary prevention of sudden death.  The patient's chart has been reviewed and they meet criteria for ICD implant.  I have had a thorough discussion with the patient reviewing options.  The patient and their family (if available) have had opportunities to ask questions and have them answered. The patient and I have decided together through the [place] [employer] to implant ICD at this time.  Risks, benefits, alternatives to ICD implantation were discussed in detail with the patient today. The patient  understands that the risks include but are not limited to bleeding, infection, pneumothorax, perforation, tamponade, vascular damage, renal failure, MI, stroke, death, inappropriate shocks, and lead dislodgement and  wishes to proceed.

## 2018-02-13 NOTE — ED Triage Notes (Signed)
Pt arrives via POV after he felt his pacemaker/defibrillator "vibrate" today. Pt states "it didn't go off, I just felt it vibrate" Pt states it has never vibrated before. Denies any acute complaints at this time. Denies SOB, CP, palpitations.

## 2018-02-13 NOTE — Discharge Instructions (Signed)
°  Post procedure site care instructions Keep incision clean and dry, no showers until after your wound check visit. You can remove outer dressing tomorrow. Leave steri-strips (little pieces of tape) on until seen in the office for wound check appointment. Call the office [phone]) for redness, drainage, swelling, or fever.

## 2018-02-13 NOTE — ED Notes (Signed)
Received call from [place] rep s/p interrogation of pacemaker. Rep reports no events. States pts device vibrated today d/t fact that device has been recalled and requires change of battery imminently. Rep reports plan of care should be to admit, have electrophysiology evaluate in the AM and determine time frame for battery replacement. Communicated this to [employer]. Pt continues to rest comfortably and deny acute complaints.

## 2018-02-13 NOTE — Discharge Summary (Addendum)
ELECTROPHYSIOLOGY PROCEDURE DISCHARGE SUMMARY    Patient ID: Jason Russell,  MRN: [id number], DOB/AGE: 05-Feb-1951 67 y.o.  Admit date: 02/12/2018 Discharge date: 02/13/2018  Primary Care Physician: Lucianne Lei, MD  Primary Cardiologist: Dr. Harrington Challenger Electrophysiologist: Dr. Patience Musca  Primary Discharge Diagnosis:  1. ICD early battery depletion alert  Secondary Discharge Diagnosis:  1. NICM 2. COPD     H/o Lung ca treated 3. OSA w/CPAP 4. Paroxysmal AFib     CHA2DS2Vasc is 3, on Xarelto  No Known Allergies   Procedures This Admission:  1.  ICD generator change      St. Jude [id number] (serial number [id number])  Brief HPI: Jason Russell is a 67 y.o. male sought medical attention at [facility] when his ICD started to vibrate alarming him.  He has had no physical symptoms.  He was found to have depleted ICD battery, admitted for EP to see and managed  Hospital Course:  The patient was admitted and monitored on telemetry, maintaining SR through his stay.  St Jude MOD interrogation was reviewed, noting early battery depletion alert on 03/14/18.   The patient has has appropriate therapy for VT in the environment of electrolyte imbalance (though not significantly with K+ 3.5 and mag 1.6).  The patient reported he ahs been feeling well, no CP, palpitations, no SOB.  No shocks or syncope.  He was noted to have K+ 3.0 /2.9 and replacement given to a K+ of 3.7.  Annella Prowell reduce his lasix and increase his aldactone, plan for BMET at his wound check visit He underwent ICD generator change with details as outlined above. The patient continues to feel well, wound care was discussed with the patient.  Routine post implant follow up is in place The patient was examined by Dr. Curt Bears and considered stable for discharge to home.    Physical Exam: Vitals:   02/13/18 1624 02/13/18 1630 02/13/18 1702 02/13/18 1723  BP:  (!) 147/81 137/74 138/75  Pulse: 87     Resp: (!) 0 15 (!) 27 19  Temp:        TempSrc:      SpO2: (!) 0%     Weight:      Height:        GEN- The patient is well appearing, alert and oriented x 3 today.   HEENT: normocephalic, atraumatic; sclera clear, conjunctiva pink; hearing intact; oropharynx clear Lungs- CTA b/l, normal work of breathing.  No wheezes, rales, rhonchi Heart- RRR, no murmurs, rubs or gallops, PMI not laterally displaced GI- soft, non-tender, non-distended Extremities- no clubbing, cyanosis, or edema MS- no significant deformity or atrophy Skin- warm and dry, no rash or lesion, left chest without hematoma/ecchymosis, bleeding Psych- euthymic mood, full affect Neuro- no gross defecits  Labs:   Lab Results  Component Value Date   WBC 5.6 02/13/2018   HGB 13.3 02/13/2018   HCT 42.2 02/13/2018   MCV 90.0 02/13/2018   PLT 174 02/13/2018    Recent Labs  Lab 02/13/18 0010  02/13/18 1644  NA 140  --   --   K 3.0*   < > 3.7  CL 104  --   --   CO2 28  --   --   BUN 20  --   --   CREATININE 1.81*  --   --   CALCIUM 9.5  --   --   GLUCOSE 99  --   --    < > = values in this interval not  displayed.    Discharge Medications:  Allergies as of 02/13/2018   No Known Allergies     Medication List    STOP taking these medications   potassium chloride SA 20 MEQ tablet Commonly known as:  K-DUR,KLOR-CON     TAKE these medications   acetaminophen 500 MG tablet Commonly known as:  TYLENOL Take 1,000 mg by mouth every 6 (six) hours as needed for mild pain.   albuterol 108 (90 Base) MCG/ACT inhaler Commonly known as:  PROVENTIL HFA;VENTOLIN HFA INHALE 2 PUFFS INTO THE LUNGS EVERY 4 (FOUR) HOURS AS NEEDED FOR WHEEZING OR SHORTNESS OF BREATH   amiodarone 100 MG tablet Commonly known as:  PACERONE Take 1 tablet (100 mg total) by mouth daily.   atorvastatin 20 MG tablet Commonly known as:  LIPITOR TAKE 1 TABLET BY MOUTH EVERY DAY   carvedilol 12.5 MG tablet Commonly known as:  COREG TAKE 1 TABLET (12.5 MG TOTAL) BY MOUTH 2 (TWO)  TIMES DAILY. What changed:  See the new instructions.   CVS D3 50 MCG (2000 UT) Caps Generic drug:  Cholecalciferol Take 2,000 Units by mouth daily.   feeding supplement (ENSURE ENLIVE) Liqd Take 237 mLs by mouth 2 (two) times daily between meals.   ferrous gluconate 324 MG tablet Commonly known as:  FERGON Take 1 tablet (324 mg total) by mouth 3 (three) times daily with meals.   Fluticasone-Umeclidin-Vilant 100-62.5-25 MCG/INH Aepb Inhale 1 puff into the lungs daily.   furosemide 40 MG tablet Commonly known as:  LASIX Take 1 tablet (40 mg total) by mouth daily. What changed:    medication strength  See the new instructions.   hydrALAZINE 100 MG tablet Commonly known as:  APRESOLINE TAKE 1 TABLET BY MOUTH 3 (THREE) TIMES DAILY. What changed:    how much to take  how to take this  when to take this   levothyroxine 50 MCG tablet Commonly known as:  SYNTHROID, LEVOTHROID TAKE 1 TABLET BY MOUTH DAILY BEFORE BREAKFAST What changed:  See the new instructions.   lidocaine-prilocaine cream Commonly known as:  EMLA APPLY GENEROUS AMOUNT TO PORT SITE AT LEAST 1 HR PRIOR TO TREATMENT. DO NOT RUB IN. What changed:  See the new instructions.   lisinopril 40 MG tablet Commonly known as:  PRINIVIL,ZESTRIL Take 1 tablet (40 mg total) by mouth daily.   magnesium oxide 400 (241.3 Mg) MG tablet Commonly known as:  MAG-OX Take 1 tablet (400 mg total) by mouth daily.   Potassium Chloride ER 20 MEQ Tbcr Take 20 mEq by mouth daily.   prochlorperazine 10 MG tablet Commonly known as:  COMPAZINE Take 1 tablet (10 mg total) by mouth every 6 (six) hours as needed for nausea or vomiting.   REFRESH OP Place 1 drop into both eyes every morning.   rivaroxaban 20 MG Tabs tablet Commonly known as:  XARELTO Take 1 tablet (20 mg total) by mouth daily.   sildenafil 100 MG tablet Commonly known as:  VIAGRA Take 100 mg by mouth daily as needed for erectile dysfunction.     spironolactone 25 MG tablet Commonly known as:  ALDACTONE Take 1 tablet (25 mg total) by mouth daily. What changed:  how much to take   sucralfate 1 g tablet Commonly known as:  CARAFATE TAKE 1 TABLET BY MOUTH FOUR TIMES DAILY (5 MINUTES BEFORE MEALS AND AT BEDTIME) FOR ESOPHAGITIS What changed:    how much to take  how to take this  when to take this  additional instructions   ULORIC 40 MG tablet Generic drug:  febuxostat Take 1 tablet (40 mg total) by mouth daily.       Disposition:  Home  Discharge Instructions    Diet - low sodium heart healthy   Complete by:  As directed    Increase activity slowly   Complete by:  As directed      Follow-up Information    [facility] Follow up.   Specialty:  Cardiology Why:  02/27/18 @ 9:00AM, wound check visit and lab draw Contact information: [address]       Deboraha Sprang, MD Follow up.   Specialty:  Cardiology Why:  05/20/18 @ 9:00AM Contact information: [address] [address] [phone]           Duration of Discharge Encounter: Greater than 30 minutes including physician time.  Venetia Night, PA-C 02/13/2018 6:03 PM   I have seen and examined this patient with Tommye Standard.  Agree with above, note added to reflect my findings.  On exam, RRR, no murmurs, lungs clear.  Patient admitted to the hospital after his ICD buzzed.  It was found that he had premature battery depletion.  He thus had a generator change of his [place] ICD.  Discharged home with follow-up in device clinic..    Chet Greenley M. Draven Laine MD 02/14/2018 10:26 AM

## 2018-02-13 NOTE — ED Notes (Signed)
Patient transported to X-ray 

## 2018-02-13 NOTE — ED Provider Notes (Signed)
[name] EMERGENCY DEPARTMENT Provider Note   CSN: [id number] Arrival date & time: 02/12/18  2357     History   Chief Complaint Chief Complaint  Patient presents with  . Pacemaker Problem    HPI Jason Russell is a 67 y.o. male with a h/o of Stage IIIA non-small cell lung cancer, Afib, NICM s/p St. Jude ICD, COPD, OSA, HTN, HLD, and hypothyroidism who presents to the emergency department from home with a chief complaint of pacemaker problem.  The patient reports that his pacemaker began to vibrate approximately 1 hour prior to arrival in the ED.  He states that he was in the kitchen cooking when he felt the vibration.  He reports he felt 1 vibration this morning, but didn't think anything of it.  He denies his ICD firing.  He reports that he was told that the battery life on his ICD had at least 4 years left.  The ICD was placed on 10/03/12. He has no symptoms at this time, including chest pain, shortness of breath, palpitations, numbness, weakness, dizziness, lightheadedness, visual changes, syncope and only came to have the vibration assessed.  He states that he submitted the event to [place] prior to arrival in the ED.[name]  The history is provided by the patient. No language interpreter was used.    Past Medical History:  Diagnosis Date  . Adenocarcinoma of right lung, stage 3 ([place]) 08/23/2016  . Adenocarcinoma of right lung, stage 3 ([place]) 08/23/2016  . Anemia   . Arthritis    "hx right hip"  . Asthma    "when I was a child"  . Atrial fibrillation (HCC)    Amiodarone started 10/2011; Coumadin  . 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 St. Jude ICD implantation 10/03/12.  . Chronic anticoagulation   . Chronic fatigue 10/18/2016  . Chronic fatigue 10/18/2016  . Chronic systolic heart failure ([place])    a. Echo 7/13: EF 25%;  b. echo 04/2012:  Mild LVH, EF 30-35%, Gr 1 DD, mild AI, mild MR, mild LAE  . COPD (chronic obstructive  pulmonary disease) ([place])   . Dyslipidemia   . Dysrhythmia   . Encounter for antineoplastic chemotherapy 08/24/2016  . Gout   . History of blood transfusion 10/15/2013   "don't know where the blood's going; HgB down to 5"  . Hyperlipidemia   . Hypertension   . Hypothyroidism   . NICM (nonischemic cardiomyopathy) ([place])    [place] 4/14:  minimal CAD  . Obesity   . OSA on CPAP   . Tobacco abuse     Patient Active Problem List   Diagnosis Date Noted  . Pacemaker complications 62/13/0865  . ICD (implantable cardioverter-defibrillator) malfunction 02/13/2018  . Weight loss, non-intentional 01/08/2017  . Defibrillator discharge 01/08/2017  . Encounter for antineoplastic immunotherapy 11/27/2016  . Chronic fatigue 10/18/2016  . Port catheter in place 09/25/2016  . Encounter for antineoplastic chemotherapy 08/24/2016  . Goals of care, counseling/discussion 08/24/2016  . Primary cancer of right upper lobe of lung ([place]) 08/23/2016  . Lung nodule < 6cm on CT   . Mediastinal adenopathy   . Lung nodule 07/21/2016  . Right rotator cuff tear 06/05/2016  . Obesity 10/07/2015  . COPD (chronic obstructive pulmonary disease) ([place]) 10/07/2015  . Exertional dyspnea 10/07/2015  . Anemia 10/29/2013  . Internal hemorrhoids without mention of complication 78/46/9629  . Iron deficiency anemia, unspecified 10/16/2013  . Symptomatic anemia 10/15/2013  . Nonischemic cardiomyopathy ([place]) 08/16/2012  .  Shortness of breath dyspnea 11/01/2011  . Chronic anticoagulation 11/01/2011  . Chronic systolic heart failure ([place]) 11/01/2011  . Obstructive sleep apnea 12/20/2009  . Atrial fibrillation ([place]) 09/24/2009  . HYPERPOTASSEMIA 08/04/2009  . HYPOPOTASSEMIA 07/26/2009  . Hyperlipidemia 07/15/2009  . OVERWEIGHT/OBESITY 07/15/2009  . TOBACCO ABUSE 07/15/2009  . HYPERTENSION, BENIGN 07/15/2009    Past Surgical History:  Procedure Laterality Date  . CARDIAC DEFIBRILLATOR PLACEMENT  2014  . CARDIOVERSION   2011  . COLONOSCOPY WITH PROPOFOL Left 10/17/2013   Procedure: COLONOSCOPY WITH PROPOFOL;  Surgeon: Inda Castle, MD;  Location: [facility];  Service: Endoscopy;  Laterality: Left;  . ESOPHAGOGASTRODUODENOSCOPY N/A 10/17/2013   Procedure: ESOPHAGOGASTRODUODENOSCOPY (EGD);  Surgeon: Inda Castle, MD;  Location: [facility];  Service: Endoscopy;  Laterality: N/A;  . GIVENS CAPSULE STUDY N/A 10/29/2013   Procedure: GIVENS CAPSULE STUDY;  Surgeon: Inda Castle, MD;  Location: [facility] END[facility];  Service: Endoscopy;  Laterality: N/A;  . IMPLANTABLE CARDIOVERTER DEFIBRILLATOR IMPLANT Left 10/03/2012   Procedure: IMPLANTABLE CARDIOVERTER DEFIBRILLATOR IMPLANT;  Surgeon: Deboraha Sprang, MD;  Location: [facility] CATH LAB;  Service: Cardiovascular;  Laterality: Left;  . IR FLUORO GUIDE PORT INSERTION RIGHT  09/21/2016  . IR US GUIDE VASC ACCESS RIGHT  09/21/2016  . JOINT REPLACEMENT    . TONSILLECTOMY  1950's  . TOTAL HIP ARTHROPLASTY Right 11/25/1997  . VIDEO BRONCHOSCOPY WITH ENDOBRONCHIAL ULTRASOUND N/A 08/09/2016   Procedure: VIDEO BRONCHOSCOPY WITH ENDOBRONCHIAL ULTRASOUND;  Surgeon: Javier Glazier, MD;  Location: [facility] OR;  Service: Thoracic;  Laterality: N/A;        Home Medications    Prior to Admission medications   Medication Sig Start Date End Date Taking? Authorizing Provider  acetaminophen (TYLENOL) 500 MG tablet Take 1,000 mg by mouth every 6 (six) hours as needed for mild pain.   Yes [provider]  albuterol (PROVENTIL HFA;VENTOLIN HFA) 108 (90 Base) MCG/ACT inhaler INHALE 2 PUFFS INTO THE LUNGS EVERY 4 (FOUR) HOURS AS NEEDED FOR WHEEZING OR SHORTNESS OF BREATH 11/30/17  Yes Parrett, Tammy S, NP  amiodarone (PACERONE) 100 MG tablet Take 1 tablet (100 mg total) by mouth daily. 10/11/17  Yes Fay Records, MD  atorvastatin (LIPITOR) 20 MG tablet TAKE 1 TABLET BY MOUTH EVERY DAY Patient taking differently: Take 20 mg by mouth daily.  06/11/17  Yes Fay Records, MD  carvedilol (COREG)  12.5 MG tablet TAKE 1 TABLET (12.5 MG TOTAL) BY MOUTH 2 (TWO) TIMES DAILY. Patient taking differently: Take 12.5 mg by mouth 2 (two) times daily with a meal.  01/28/18  Yes Deboraha Sprang, MD  CVS D3 2000 units CAPS Take 2,000 Units by mouth daily.  11/02/16  Yes [provider]  feeding supplement, ENSURE ENLIVE, (ENSURE ENLIVE) LIQD Take 237 mLs by mouth 2 (two) times daily between meals. 01/10/17  Yes Eulogio Bear U, DO  ferrous gluconate (FERGON) 324 MG tablet Take 1 tablet (324 mg total) by mouth 3 (three) times daily with meals. 08/06/17  Yes Deboraha Sprang, MD  Fluticasone-Umeclidin-Vilant (TRELEGY ELLIPTA) 100-62.5-25 MCG/INH AEPB Inhale 1 puff into the lungs daily. 08/17/17  Yes Parrett, Tammy S, NP  furosemide (LASIX) 20 MG tablet TAKE 3 TABLETS (60 MG) EVERY TUE AND SAT, ALL OTHER DAYS TAKE 2 TABLETS (40 MG). Patient taking differently: Take 40-60 mg by mouth See admin instructions. TAKE 3 TABLETS (60 MG) EVERY TUE AND SAT, ALL OTHER DAYS TAKE 2 TABLETS (40 MG). 02/01/18  Yes Fay Records, MD  hydrALAZINE (APRESOLINE) 100 MG tablet TAKE 1 TABLET BY MOUTH 3 (THREE) TIMES DAILY. Patient taking differently: Take 100 mg by mouth 3 (three) times daily. TAKE 1 TABLET BY MOUTH 3 (THREE) TIMES DAILY. 11/01/17  Yes Fay Records, MD  levothyroxine (SYNTHROID, LEVOTHROID) 50 MCG tablet TAKE 1 TABLET BY MOUTH DAILY BEFORE BREAKFAST Patient taking differently: Take 50 mcg by mouth daily before breakfast.  10/24/17  Yes Fay Records, MD  lidocaine-prilocaine (EMLA) cream APPLY GENEROUS AMOUNT TO PORT SITE AT LEAST 1 HR PRIOR TO TREATMENT. DO NOT RUB IN. Patient taking differently: Apply 1 application topically See admin instructions. Apply generous amount to port site at least 1 hr prior to treatment.  DO NOT RUB IN. 11/21/17  Yes Curt Bears, MD  lisinopril (PRINIVIL,ZESTRIL) 40 MG tablet Take 1 tablet (40 mg total) by mouth daily. 12/21/17  Yes Fay Records, MD  magnesium oxide (MAG-OX) 400  (241.3 Mg) MG tablet Take 1 tablet (400 mg total) by mouth daily. 01/10/17  Yes Vann, Tomi Bamberger, DO  Polyvinyl Alcohol-Povidone (REFRESH OP) Place 1 drop into both eyes every morning.   Yes [provider]  potassium chloride SA (KLOR-CON M20) 20 MEQ tablet TAKE 1 TABLET BY MOUTH EVERY DAY *ON DAYS WHEN LASIX IS TAKEN, TAKE 2 TABLETS*  Dr. Harrington Challenger for further refills. 3rd attempt Patient taking differently: Take 20 mEq by mouth 2 (two) times daily.  06/06/17  Yes Fay Records, MD  rivaroxaban (XARELTO) 20 MG TABS tablet Take 1 tablet (20 mg total) by mouth daily. 10/12/17  Yes Parrett, Tammy S, NP  sildenafil (VIAGRA) 100 MG tablet Take 100 mg by mouth daily as needed for erectile dysfunction.    Yes [provider]  spironolactone (ALDACTONE) 25 MG tablet Take 0.5 tablets (12.5 mg total) by mouth daily. 11/30/17  Yes Fay Records, MD  sucralfate (CARAFATE) 1 g tablet TAKE 1 TABLET BY MOUTH FOUR TIMES DAILY (5 MINUTES BEFORE MEALS AND AT BEDTIME) FOR ESOPHAGITIS Patient taking differently: Take 1 g by mouth 3 (three) times daily before meals. DISOLVE 1 TABLET IN HOT WATER AND TAKE THREE TIMES DAILY (5 MINUTES BEFORE MEALS AND AT BEDTIME) FOR ESOPHAGITIS 08/16/17  Yes Curcio, Roselie Awkward, NP  ULORIC 40 MG tablet Take 1 tablet (40 mg total) by mouth daily. 10/17/16  Yes Deboraha Sprang, MD  prochlorperazine (COMPAZINE) 10 MG tablet Take 1 tablet (10 mg total) by mouth every 6 (six) hours as needed for nausea or vomiting. Patient not taking: Reported on 12/06/2017 08/24/16   Curt Bears, MD    Family History Family History  Problem Relation Age of Onset  . Hypertension Mother   . Heart Problems Mother   . Other Father        deceased- unknow reason  . Lung cancer Brother     Social History Social History   Tobacco Use  . Smoking status: Former Smoker    Packs/day: 0.25    Years: 45.00    Pack years: 11.25    Types: Cigarettes    Last attempt to quit: 07/11/2016    Years since  quitting: 1.5  . Smokeless tobacco: Never Used  Substance Use Topics  . Alcohol use: Yes    Alcohol/week: 1.0 standard drinks    Types: 1 Cans of beer per week  . Drug use: No     Allergies   Patient has no known allergies.   Review of Systems Review of Systems  Constitutional: Negative for  appetite change, chills and fever.  HENT: Negative for congestion.   Respiratory: Negative for cough and shortness of breath.   Cardiovascular: Negative for chest pain.  Gastrointestinal: Negative for abdominal pain, diarrhea, nausea and vomiting.  Genitourinary: Negative for dysuria.  Musculoskeletal: Negative for back pain.  Skin: Negative for rash.  Allergic/Immunologic: Negative for immunocompromised state.  Neurological: Negative for weakness, numbness and headaches.  Psychiatric/Behavioral: Negative for confusion.   Physical Exam Updated Vital Signs BP 135/82 (BP Location: Left Arm)   Pulse 67   Temp 98.4 F (36.9 C) (Oral)   Resp 18   Ht 6\' 1"  (1.854 m)   Wt 129.5 kg   SpO2 100%   BMI 37.65 kg/m   Physical Exam  Constitutional: He appears well-developed.  No acute distress.   HENT:  Head: Normocephalic.  Eyes: Conjunctivae are normal.  Neck: Neck supple.  Cardiovascular: Normal rate, regular rhythm, normal heart sounds and intact distal pulses. Exam reveals no gallop and no friction rub.  No murmur heard. Pulmonary/Chest: Effort normal and breath sounds normal. No stridor. No respiratory distress. He has no wheezes. He has no rales. He exhibits no tenderness.  Abdominal: Soft. He exhibits no distension and no mass. There is no tenderness. There is no rebound and no guarding. No hernia.  Neurological: He is alert.  Skin: Skin is warm and dry.  Psychiatric: His behavior is normal.  Nursing note and vitals reviewed.  ED Treatments / Results  Labs (all labs ordered are listed, but only abnormal results are displayed) Labs Reviewed  TROPONIN I - Abnormal; Notable for  the following components:      Result Value   Troponin I 0.05 (*)    All other components within normal limits  BASIC METABOLIC PANEL - Abnormal; Notable for the following components:   Potassium 3.0 (*)    Creatinine, Ser 1.81 (*)    GFR calc non Af Amer 37 (*)    GFR calc Af Amer 43 (*)    All other components within normal limits  CBC  HIV ANTIBODY (ROUTINE TESTING W REFLEX)    EKG EKG Interpretation  Date/Time:  Wednesday February 13 2018 00:09:20 EST Ventricular Rate:  88 PR Interval:    QRS Duration: 179 QT Interval:  448 QTC Calculation: 543 R Axis:   -71 Text Interpretation:  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 Initial encounter. EXAM: CHEST - 2 VIEW COMPARISON:  CT of the chest performed 01/09/2018, and chest radiograph performed 09/06/2016 FINDINGS: The patient's right chest port is noted ending about the mid SVC. The lungs are well-aerated. Mild peribronchial thickening is noted. There is no evidence of focal opacification, pleural effusion or pneumothorax. The heart is normal in size; the mediastinal contour is within normal limits. An AICD is noted at the left chest wall with a single lead ending at the right ventricle. The AICD is grossly unremarkable in appearance. No acute osseous abnormalities are seen. IMPRESSION: 1. Mild peribronchial thickening noted. Lungs otherwise clear. 2. AICD is grossly unremarkable in appearance. Electronically Signed   By: Garald Balding M.D.   On: 02/13/2018 01:20    Procedures Procedures (including critical care time)  Medications Ordered in ED Medications  febuxostat (ULORIC) tablet 40 mg (has no administration in time range)  amiodarone (PACERONE) tablet 100 mg (has no administration in  time range)  atorvastatin (LIPITOR) tablet 20 mg (has no  administration in time range)  carvedilol (COREG) tablet 12.5 mg (has no administration in time range)  furosemide (LASIX) tablet 60 mg (has no administration in time range)  hydrALAZINE (APRESOLINE) tablet 100 mg (has no administration in time range)  lisinopril (PRINIVIL,ZESTRIL) tablet 40 mg (has no administration in time range)  spironolactone (ALDACTONE) tablet 12.5 mg (has no administration in time range)  levothyroxine (SYNTHROID, LEVOTHROID) tablet 50 mcg (has no administration in time range)  sucralfate (CARAFATE) tablet 1 g (has no administration in time range)  ferrous gluconate (FERGON) tablet 324 mg (has no administration in time range)  acetaminophen (TYLENOL) tablet 650 mg (has no administration in time range)  sodium chloride flush (NS) 0.9 % injection 3 mL (has no administration in time range)  fluticasone furoate-vilanterol (BREO ELLIPTA) 100-25 MCG/INH 1 puff (has no administration in time range)    And  umeclidinium bromide (INCRUSE ELLIPTA) 62.5 MCG/INH 1 puff (has no administration in time range)     Initial Impression / Assessment and Plan / ED Course  I have reviewed the triage vital signs and the nursing notes.  Pertinent labs & imaging results that were available during my care of the patient were reviewed by me and considered in my medical decision making (see chart for details).     67 year old male with a h/o of Stage IIIA non-small cell lung cancer, Afib, NICM s/p St. Jude ICD, COPD, OSA, HTN, HLD, and hypothyroidism who presents to the ER from home after he felt his ICD vibrate 2 times today.  His ICD did not fire.  He is asymptomatic at this time.  The patient was discussed with Dr. Leonides Schanz, attending physician.  EKG with new bifascicular block. RN spoke with [facility] rep about the pacemaker interrogation.  No events were recorded.  The rep states the device vibrated today due to the fact that the device has been recalled and requires a battery change. Rep  reports plan should be to admit and have electrophysiology evaluate the patient in the morning and determine the timeframe for battery replacement.  Consulted cardiology and spoke with Dr. Marletta Lor who will admit the patient. CXR and labs are pending at this time. The patient appears reasonably stabilized for admission considering the current resources, flow, and capabilities available in the ED at this time, and I doubt any other [facility] requiring further screening and/or treatment in the ED prior to admission.  Final Clinical Impressions(s) / ED Diagnoses   Final diagnoses:  Presence of manufacturer-recalled cardiac pacemaker    ED Discharge Orders    None       Joanne Gavel, PA-C 02/13/18 [place], Delice Bison, DO 02/13/18 [id number]

## 2018-02-14 ENCOUNTER — Encounter (HOSPITAL_COMMUNITY): Payer: Self-pay | Admitting: Cardiology

## 2018-02-15 ENCOUNTER — Encounter (HOSPITAL_COMMUNITY): Payer: Self-pay | Admitting: Cardiology

## 2018-02-27 ENCOUNTER — Ambulatory Visit (INDEPENDENT_AMBULATORY_CARE_PROVIDER_SITE_OTHER): Payer: Medicare Other | Admitting: *Deleted

## 2018-02-27 ENCOUNTER — Other Ambulatory Visit: Payer: Medicare Other | Admitting: *Deleted

## 2018-02-27 DIAGNOSIS — Z79899 Other long term (current) drug therapy: Secondary | ICD-10-CM

## 2018-02-27 DIAGNOSIS — I428 Other cardiomyopathies: Secondary | ICD-10-CM | POA: Diagnosis not present

## 2018-02-27 DIAGNOSIS — E876 Hypokalemia: Secondary | ICD-10-CM | POA: Diagnosis not present

## 2018-02-27 LAB — CUP PACEART INCLINIC DEVICE CHECK
Date Time Interrogation Session: 20191127094056
HighPow Impedance: 37.35 Ohm
Implantable Pulse Generator Implant Date: 20191113
Lead Channel Impedance Value: 400 Ohm
Lead Channel Pacing Threshold Amplitude: 0.75 V
Lead Channel Sensing Intrinsic Amplitude: 5.6 mV
MDC IDC LEAD IMPLANT DT: 20140703
MDC IDC LEAD LOCATION: 753860
MDC IDC MSMT BATTERY REMAINING LONGEVITY: 102 mo
MDC IDC MSMT LEADCHNL RV PACING THRESHOLD PULSEWIDTH: 0.5 ms
MDC IDC SET LEADCHNL RV PACING AMPLITUDE: 2.5 V
MDC IDC SET LEADCHNL RV PACING PULSEWIDTH: 0.5 ms
MDC IDC SET LEADCHNL RV SENSING SENSITIVITY: 0.5 mV
MDC IDC STAT BRADY RV PERCENT PACED: 0 %
Pulse Gen Serial Number: 9820959

## 2018-02-27 LAB — BASIC METABOLIC PANEL
BUN/Creatinine Ratio: 14 (ref 10–24)
BUN: 25 mg/dL (ref 8–27)
CALCIUM: 9.1 mg/dL (ref 8.6–10.2)
CO2: 22 mmol/L (ref 20–29)
CREATININE: 1.81 mg/dL — AB (ref 0.76–1.27)
Chloride: 102 mmol/L (ref 96–106)
GFR, EST AFRICAN AMERICAN: 44 mL/min/{1.73_m2} — AB (ref >59)
GFR, EST NON AFRICAN AMERICAN: 38 mL/min/{1.73_m2} — AB (ref >59)
Glucose: 106 mg/dL — ABNORMAL HIGH (ref 65–99)
Potassium: 3.6 mmol/L (ref 3.5–5.2)
Sodium: 141 mmol/L (ref 134–144)

## 2018-02-27 NOTE — Progress Notes (Signed)
Wound check appointment s/p gen change. Steri-strips removed. Wound without redness or ecchymosis. Slight edema, soft to palpate, no drainage. Patient states no fever or chills. Incision edges approximated, wound well healed. Educated patient on signs of active bleeding and to give device clinic a call if site increases in size, ecchymosis is noted over site, fever/chills or drainage is noted.   Normal device function. Thresholds, sensing, and impedances consistent with implant measurements. Device programmed at chronic outputs s/p gen change. Histogram distribution appropriate for patient and level of activity. No mode switches or ventricular arrhythmias noted. Patient educated about wound care, arm mobility, lifting restrictions, shock plan. ROV with SK 05/20/18

## 2018-03-06 ENCOUNTER — Other Ambulatory Visit: Payer: Self-pay | Admitting: Internal Medicine

## 2018-03-06 ENCOUNTER — Telehealth: Payer: Self-pay | Admitting: *Deleted

## 2018-03-06 NOTE — Telephone Encounter (Signed)
LMOVM OF NORMAL RESULTS ON VM AND CLINIC NUMBER TO CONTACT CLINIC BACK IF NEEDED TO .

## 2018-03-06 NOTE — Telephone Encounter (Signed)
-----   Message from [place], [place] sent at 02/27/2018  4:53 PM EST ----- Looks OK, no changes to medicines. Potassium is wnl   Thanks renee

## 2018-03-08 ENCOUNTER — Other Ambulatory Visit: Payer: Self-pay | Admitting: Physician Assistant

## 2018-03-11 ENCOUNTER — Encounter: Payer: Self-pay | Admitting: Internal Medicine

## 2018-03-11 ENCOUNTER — Ambulatory Visit (INDEPENDENT_AMBULATORY_CARE_PROVIDER_SITE_OTHER): Payer: Medicare Other | Admitting: Internal Medicine

## 2018-03-11 VITALS — BP 118/64 | HR 76 | Ht 73.0 in | Wt 289.6 lb

## 2018-03-11 DIAGNOSIS — I480 Paroxysmal atrial fibrillation: Secondary | ICD-10-CM | POA: Diagnosis not present

## 2018-03-11 DIAGNOSIS — I2510 Atherosclerotic heart disease of native coronary artery without angina pectoris: Secondary | ICD-10-CM

## 2018-03-11 DIAGNOSIS — I10 Essential (primary) hypertension: Secondary | ICD-10-CM | POA: Diagnosis not present

## 2018-03-11 DIAGNOSIS — I428 Other cardiomyopathies: Secondary | ICD-10-CM | POA: Diagnosis not present

## 2018-03-11 MED ORDER — CARVEDILOL 12.5 MG PO TABS: 12.5000 mg | ORAL_TABLET | Freq: Two times a day (BID) | ORAL | 3 refills | Status: DC

## 2018-03-11 MED ORDER — SILDENAFIL CITRATE 100 MG PO TABS: 100.0000 mg | ORAL_TABLET | Freq: Every day | ORAL | 3 refills | Status: DC | PRN

## 2018-03-11 MED ORDER — SILDENAFIL CITRATE 20 MG PO TABS: ORAL_TABLET | ORAL | 1 refills | Status: DC

## 2018-03-11 NOTE — Patient Instructions (Signed)
Medication Instructions:  No change If you need a refill on your cardiac medications before your next appointment, please call your pharmacy.   Lab work: none If you have labs (blood work) drawn today and your tests are completely normal, you will receive your results only by: [name] MyChart Message (if you have MyChart) OR . A paper copy in the mail If you have any lab test that is abnormal or we need to change your treatment, we will call you to review the results.  Testing/Procedures: none  Follow-Up: At [facility], you and your health needs are our priority.  As part of our continuing mission to provide you with exceptional heart care, we have created designated Provider Care Teams.  These Care Teams include your primary Cardiologist (physician) and Advanced Practice Providers (APPs -  Physician Assistants and Nurse Practitioners) who all work together to provide you with the care you need, when you need it. You will need a follow up appointment in:  4-5 months.  Please call our office 2 months in advance to schedule this appointment.  You may see Dorris Carnes, MD or one of the following Advanced Practice Providers on your designated Care Team: Richardson Dopp, PA-C [place], [place] . Daune Perch, NP  Any Other Special Instructions Will Be Listed Below (If Applicable).

## 2018-03-11 NOTE — Progress Notes (Addendum)
Cardiology Office Note   Date:  03/11/2018   ID:  Jason Russell, DOB 1951-01-27, MRN [id number]  PCP:  Lucianne Lei, MD  Cardiologist:   Dorris Carnes, MD   F/U of chronic systolic CHF       History of Present Illness: Jason Russell is a 67 y.o. male with a history ofHTN, sleep apnea, paroxysmal  AFib, systolic CHF He has been treated with  Amiodarone (not a Tikosyn candidate with prolonged QT). Initially LVEF was thought to down due to tachycardia in rapid AFib. Echo 7/13: EF 25%. Follow up echo 04/2012: Mild LVH, EF 30-35%, Without full recovery of LVF, he was referred for cardiac cath. LHC 07/15/12: No obstructive CAD. Last echo in October 2018 LVEF 35 to 40%  The pt is now being followed in the cancer center ([name]) for lung CA   Underweent XRT and chemotherapy     I saw the pt in the summer     THis fall he underwent gen change for AICD   PT denies CP   Breatihng is OK   No palpitations    NO dizziness No edema     Current Meds  Medication Sig  . acetaminophen (TYLENOL) 500 MG tablet Take 1,000 mg by mouth every 6 (six) hours as needed for mild pain.  [name] albuterol (PROVENTIL HFA;VENTOLIN HFA) 108 (90 Base) MCG/ACT inhaler INHALE 2 PUFFS INTO THE LUNGS EVERY 4 (FOUR) HOURS AS NEEDED FOR WHEEZING OR SHORTNESS OF BREATH  . amiodarone (PACERONE) 100 MG tablet Take 1 tablet (100 mg total) by mouth daily.  [name] atorvastatin (LIPITOR) 20 MG tablet TAKE 1 TABLET BY MOUTH EVERY DAY  . carvedilol (COREG) 12.5 MG tablet TAKE 1 TABLET (12.5 MG TOTAL) BY MOUTH 2 (TWO) TIMES DAILY. (Patient taking differently: Take 12.5 mg by mouth 2 (two) times daily with a meal. )  . [employer] D3 2000 units CAPS Take 2,000 Units by mouth daily.   . feeding supplement, ENSURE ENLIVE, (ENSURE ENLIVE) LIQD Take 237 mLs by mouth 2 (two) times daily between meals.  . ferrous gluconate (FERGON) 324 MG tablet Take 1 tablet (324 mg total) by mouth 3 (three) times daily with meals.  .  Fluticasone-Umeclidin-Vilant (TRELEGY ELLIPTA) 100-62.5-25 MCG/INH AEPB Inhale 1 puff into the lungs daily.  . furosemide (LASIX) 40 MG tablet Take 1 tablet (40 mg total) by mouth daily.  . hydrALAZINE (APRESOLINE) 100 MG tablet TAKE 1 TABLET BY MOUTH 3 (THREE) TIMES DAILY. (Patient taking differently: Take 100 mg by mouth 3 (three) times daily. TAKE 1 TABLET BY MOUTH 3 (THREE) TIMES DAILY.)  . levothyroxine (SYNTHROID, LEVOTHROID) 50 MCG tablet TAKE 1 TABLET BY MOUTH DAILY BEFORE BREAKFAST (Patient taking differently: Take 50 mcg by mouth daily before breakfast. )  . lidocaine-prilocaine (EMLA) cream APPLY GENEROUS AMOUNT TO PORT SITE AT LEAST 1 HR PRIOR TO TREATMENT. DO NOT RUB IN. (Patient taking differently: Apply 1 application topically See admin instructions. Apply generous amount to port site at least 1 hr prior to treatment.  DO NOT RUB IN.)  . lisinopril (PRINIVIL,ZESTRIL) 40 MG tablet Take 1 tablet (40 mg total) by mouth daily.  . magnesium oxide (MAG-OX) 400 (241.3 Mg) MG tablet Take 1 tablet (400 mg total) by mouth daily.  . Polyvinyl Alcohol-Povidone (REFRESH OP) Place 1 drop into both eyes every morning.  . Potassium Chloride ER 20 MEQ TBCR TAKE 1 TABLET BY MOUTH EVERY DAY  . prochlorperazine (COMPAZINE) 10 MG tablet Take 1 tablet (10 mg total)  by mouth every 6 (six) hours as needed for nausea or vomiting.  . rivaroxaban (XARELTO) 20 MG TABS tablet Take 1 tablet (20 mg total) by mouth daily.  . sildenafil (VIAGRA) 100 MG tablet Take 100 mg by mouth daily as needed for erectile dysfunction.   [name] spironolactone (ALDACTONE) 25 MG tablet Take 1 tablet (25 mg total) by mouth daily.  . sucralfate (CARAFATE) 1 g tablet TAKE 1 TABLET BY MOUTH FOUR TIMES DAILY (5 MINUTES BEFORE MEALS AND AT BEDTIME) FOR ESOPHAGITIS (Patient taking differently: Take 1 g by mouth 3 (three) times daily before meals. DISOLVE 1 TABLET IN HOT WATER AND TAKE THREE TIMES DAILY (5 MINUTES BEFORE MEALS AND AT BEDTIME) FOR  ESOPHAGITIS)  . ULORIC 40 MG tablet Take 1 tablet (40 mg total) by mouth daily.     Allergies:   Patient has no known allergies.   Past Medical History:  Diagnosis Date  . Adenocarcinoma of right lung, stage 3 ([place]) 08/23/2016  . Adenocarcinoma of right lung, stage 3 ([place]) 08/23/2016  . Anemia   . Arthritis    "hx right hip"  . Asthma    "when I was a child"  . Atrial fibrillation (H[facility])    Amiodarone started 10/2011; Coumadin  . 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 St. Jude ICD implantation 10/03/12.  . Chronic anticoagulation   . Chronic fatigue 10/18/2016  . Chronic fatigue 10/18/2016  . Chronic systolic heart failure ([place])    a. Echo 7/13: EF 25%;  b. echo 04/2012:  Mild LVH, EF 30-35%, Gr 1 DD, mild AI, mild MR, mild LAE  . COPD (chronic obstructive pulmonary disease) ([place])   . Dyslipidemia   . Dysrhythmia   . Encounter for antineoplastic chemotherapy 08/24/2016  . Gout   . History of blood transfusion 10/15/2013   "don't know where the blood's going; HgB down to 5"  . Hyperlipidemia   . Hypertension   . Hypothyroidism   . NICM (nonischemic cardiomyopathy) ([place])    [place] 4/14:  minimal CAD  . Obesity   . OSA on CPAP   . Tobacco abuse     Past Surgical History:  Procedure Laterality Date  . CARDIAC DEFIBRILLATOR PLACEMENT  2014  . CARDIOVERSION  2011  . COLONOSCOPY WITH PROPOFOL Left 10/17/2013   Procedure: COLONOSCOPY WITH PROPOFOL;  Surgeon: Inda Castle, MD;  Location: [facility];  Service: Endoscopy;  Laterality: Left;  . ESOPHAGOGASTRODUODENOSCOPY N/A 10/17/2013   Procedure: ESOPHAGOGASTRODUODENOSCOPY (EGD);  Surgeon: Inda Castle, MD;  Location: [facility];  Service: Endoscopy;  Laterality: N/A;  . GIVENS CAPSULE STUDY N/A 10/29/2013   Procedure: GIVENS CAPSULE STUDY;  Surgeon: Inda Castle, MD;  Location: [facility] ENDOSCOPY;  Service: Endoscopy;  Laterality: N/A;  . ICD GENERATOR CHANGEOUT N/A 02/13/2018   Procedure: ICD  GENERATOR CHANGEOUT;  Surgeon: Constance Haw, MD;  Location: [facility] CV LAB;  Service: Cardiovascular;  Laterality: N/A;  . IMPLANTABLE CARDIOVERTER DEFIBRILLATOR IMPLANT Left 10/03/2012   Procedure: IMPLANTABLE CARDIOVERTER DEFIBRILLATOR IMPLANT;  Surgeon: Deboraha Sprang, MD;  Location: [facility] CATH LAB;  Service: Cardiovascular;  Laterality: Left;  . IR FLUORO GUIDE PORT INSERTION RIGHT  09/21/2016  . IR US GUIDE VASC ACCESS RIGHT  09/21/2016  . JOINT REPLACEMENT    . TONSILLECTOMY  1950's  . TOTAL HIP ARTHROPLASTY Right 11/25/1997  . VIDEO BRONCHOSCOPY WITH ENDOBRONCHIAL ULTRASOUND N/A 08/09/2016   Procedure: VIDEO BRONCHOSCOPY WITH ENDOBRONCHIAL ULTRASOUND;  Surgeon: Javier Glazier, MD;  Location: [facility]  OR;  Service: Thoracic;  Laterality: N/A;     Social History:  The patient  reports that he quit smoking about 20 months ago. His smoking use included cigarettes. He has a 11.25 pack-year smoking history. He has never used smokeless tobacco. He reports that he drinks about 1.0 standard drinks of alcohol per week. He reports that he does not use drugs.   Family History:  The patient's family history includes Heart Problems in his mother; Hypertension in his mother; Lung cancer in his brother; Other in his father.    ROS:  Please see the history of present illness. All other systems are reviewed and  Negative to the above problem except as noted.    PHYSICAL EXAM: VS:  BP 118/64   Pulse 76   Ht 6\' 1"  (1.854 m)   Wt 289 lb 9.6 oz (131.4 kg)   SpO2 97%   BMI 38.21 kg/m   GEN: Morbidly obese 67 yo in no acute distress  HEENT: normal  Neck: Neck full  ,No  carotid bruits, or masses Cardiac: RRR; no murmurs, rubs, or gallops,  Tr LE  edema  Respiratory:  clear to auscultation bilaterally, normal work of breathing GI: soft, nontender, nondistended, + BS  No hepatomegaly  MS: no deformity Moving all extremities   Skin: warm and dry, no rash Neuro:  Strength and sensation are  intact Psych: euthymic mood, full affect   EKG:  EKG is not ordered today.    Lipid Panel    Component Value Date/Time   CHOL 150 12/23/2012 1205   TRIG 73.0 12/23/2012 1205   HDL 46.60 12/23/2012 1205   CHOLHDL 3 12/23/2012 1205   VLDL 14.6 12/23/2012 1205   LDLCALC 89 12/23/2012 1205      Wt Readings from Last 3 Encounters:  03/11/18 289 lb 9.6 oz (131.4 kg)  02/13/18 285 lb 6.4 oz (129.5 kg)  01/16/18 291 lb (132 kg)      ASSESSMENT AND PLAN:  1  Paroxysmal atrial fib  On amio  Remains in SR  Cont current regimen    2  Chronic systolic CHF  Volume 8is good   He is on lisinopril   WIll check on copay for entresto   Has appt with [name] in Feb   I will set to see him in April   Switch if cost and BP OK    THis is the best I have seen him and keep as is for right now   3  HTN  BP is the best I have seen    4  Onc   Follows in cancer center Appt this winter with repeat scans     Current medicines are reviewed at length with the patient today.  The patient does not have concerns regarding medicines.  Signed, Dorris Carnes, MD  03/11/2018 9:29 AM    [address], [place], [place]  [address] Phone: [phone]; Fax: [phone]

## 2018-03-14 ENCOUNTER — Ambulatory Visit (INDEPENDENT_AMBULATORY_CARE_PROVIDER_SITE_OTHER): Payer: Medicare Other

## 2018-03-14 DIAGNOSIS — I428 Other cardiomyopathies: Secondary | ICD-10-CM

## 2018-03-14 DIAGNOSIS — Z95810 Presence of automatic (implantable) cardiac defibrillator: Secondary | ICD-10-CM | POA: Diagnosis not present

## 2018-03-14 DIAGNOSIS — I5022 Chronic systolic (congestive) heart failure: Secondary | ICD-10-CM

## 2018-03-15 ENCOUNTER — Encounter: Payer: Self-pay | Admitting: Cardiology

## 2018-03-15 NOTE — Progress Notes (Signed)
Remote ICD transmission.   

## 2018-03-15 NOTE — Progress Notes (Signed)
EPIC Encounter for ICM Monitoring  Patient Name: Jason Russell is a 67 y.o. male Date: 03/15/2018 Primary Care Physican: Lucianne Lei, MD Primary Cardiologist:Ross Electrophysiologist: Caryl Comes Last Weight: 284lbs Today's Weight: unknown                                                          Transmission reviewed. Gen change 02/12/2018.   Thoracic impedance normal.  Prescribed: Furosemide 20 mg 3 tablets (60 mg total) every Tuesday and Saturday and 40 mg all the other days. Potassium 20 mEq 1 tablet every day and on days when lasix is taken, take 2 tablets  Labs: 02/27/2018 Creatinine 1.81, BUN 25, Potassium 3.6, Sodium 141, eGFR 38-44 02/13/2018 Creatinine 1.81, BUN 20, Potassium 3.7, Sodium 140, eGFR 37-43 01/09/2018 Creatinine 1.95, BUN 30, Potassium 3.5, Sodium 144, eGFR 34-39 12/20/2017 Creatinine 1.86, BUN 35, Potassium 3.6, Sodium 145, eGFR 36-42  12/06/2017 Creatinine 2.13, BUN 36, Potassium 3.4, Sodium 144, eGFR 31-35  11/22/2017 Creatinine 1.67, BUN 24, Potassium 3.4, Sodium 144, eGFR 41-48  11/08/2017 Creatinine1.48, BUN24, Potassium3.5, Sodium141, [id number] 10/25/2017 Creatinine1.82, BUN27, Potassium3.6, [id number], [id number]  10/11/2017 Creatinine1.64, BUN25, Potassium4.2, [id number], LTJQ30-09  09/27/2017 Creatinine1.65, BUN27, Potassium3.6, [id number], QJFH54-56  A complete set of results can be found in results review  Recommendations: None  Follow-up plan: ICM clinic phone appointment on 04/22/2018.    Copy of ICM check sent to Dr. Caryl Comes.   3 month ICM trend: 03/14/2018    1 Year ICM trend:       [name], RN 03/15/2018 12:29 PM

## 2018-03-18 ENCOUNTER — Other Ambulatory Visit: Payer: Self-pay | Admitting: Internal Medicine

## 2018-03-18 MED ORDER — FERROUS GLUCONATE 324 (38 FE) MG PO TABS: 324.0000 mg | ORAL_TABLET | Freq: Three times a day (TID) | ORAL | 7 refills | Status: DC

## 2018-03-19 ENCOUNTER — Other Ambulatory Visit: Payer: Self-pay | Admitting: Radiation Oncology

## 2018-03-19 DIAGNOSIS — K209 Esophagitis, unspecified without bleeding: Secondary | ICD-10-CM

## 2018-03-19 DIAGNOSIS — C3411 Malignant neoplasm of upper lobe, right bronchus or lung: Secondary | ICD-10-CM

## 2018-03-25 DIAGNOSIS — Z0000 Encounter for general adult medical examination without abnormal findings: Secondary | ICD-10-CM | POA: Diagnosis not present

## 2018-04-04 ENCOUNTER — Telehealth: Payer: Self-pay | Admitting: Internal Medicine

## 2018-04-04 NOTE — Telephone Encounter (Signed)
Patient called to change 1/17 lab to 11:30 am. Patient has new d/t.

## 2018-04-07 ENCOUNTER — Other Ambulatory Visit: Payer: Self-pay | Admitting: Radiation Oncology

## 2018-04-07 DIAGNOSIS — K209 Esophagitis, unspecified without bleeding: Secondary | ICD-10-CM

## 2018-04-07 DIAGNOSIS — C3411 Malignant neoplasm of upper lobe, right bronchus or lung: Secondary | ICD-10-CM

## 2018-04-12 ENCOUNTER — Other Ambulatory Visit: Payer: Self-pay | Admitting: Internal Medicine

## 2018-04-12 DIAGNOSIS — Z6838 Body mass index (BMI) 38.0-38.9, adult: Secondary | ICD-10-CM | POA: Diagnosis not present

## 2018-04-12 DIAGNOSIS — I130 Hypertensive heart and chronic kidney disease with heart failure and stage 1 through stage 4 chronic kidney disease, or unspecified chronic kidney disease: Secondary | ICD-10-CM | POA: Diagnosis not present

## 2018-04-12 DIAGNOSIS — L2089 Other atopic dermatitis: Secondary | ICD-10-CM | POA: Diagnosis not present

## 2018-04-12 DIAGNOSIS — C3490 Malignant neoplasm of unspecified part of unspecified bronchus or lung: Secondary | ICD-10-CM | POA: Diagnosis not present

## 2018-04-12 DIAGNOSIS — I10 Essential (primary) hypertension: Secondary | ICD-10-CM | POA: Diagnosis not present

## 2018-04-12 DIAGNOSIS — N400 Enlarged prostate without lower urinary tract symptoms: Secondary | ICD-10-CM | POA: Diagnosis not present

## 2018-04-19 ENCOUNTER — Inpatient Hospital Stay: Payer: Medicare Other | Attending: Internal Medicine

## 2018-04-19 ENCOUNTER — Ambulatory Visit (HOSPITAL_COMMUNITY)
Admission: RE | Admit: 2018-04-19 | Discharge: 2018-04-19 | Disposition: A | Discharge status: Home / Self Care | Payer: Medicare Other | Source: Ambulatory Visit | Attending: Internal Medicine | Admitting: Internal Medicine

## 2018-04-19 DIAGNOSIS — Z9989 Dependence on other enabling machines and devices: Secondary | ICD-10-CM | POA: Insufficient documentation

## 2018-04-19 DIAGNOSIS — J449 Chronic obstructive pulmonary disease, unspecified: Secondary | ICD-10-CM | POA: Insufficient documentation

## 2018-04-19 DIAGNOSIS — Z9221 Personal history of antineoplastic chemotherapy: Secondary | ICD-10-CM | POA: Insufficient documentation

## 2018-04-19 DIAGNOSIS — C3411 Malignant neoplasm of upper lobe, right bronchus or lung: Secondary | ICD-10-CM | POA: Diagnosis not present

## 2018-04-19 DIAGNOSIS — Z923 Personal history of irradiation: Secondary | ICD-10-CM | POA: Insufficient documentation

## 2018-04-19 DIAGNOSIS — R1310 Dysphagia, unspecified: Secondary | ICD-10-CM | POA: Insufficient documentation

## 2018-04-19 DIAGNOSIS — I5022 Chronic systolic (congestive) heart failure: Secondary | ICD-10-CM | POA: Insufficient documentation

## 2018-04-19 DIAGNOSIS — C3490 Malignant neoplasm of unspecified part of unspecified bronchus or lung: Secondary | ICD-10-CM

## 2018-04-19 DIAGNOSIS — I110 Hypertensive heart disease with heart failure: Secondary | ICD-10-CM | POA: Insufficient documentation

## 2018-04-19 DIAGNOSIS — I4891 Unspecified atrial fibrillation: Secondary | ICD-10-CM | POA: Diagnosis not present

## 2018-04-19 DIAGNOSIS — M1990 Unspecified osteoarthritis, unspecified site: Secondary | ICD-10-CM | POA: Diagnosis not present

## 2018-04-19 DIAGNOSIS — E785 Hyperlipidemia, unspecified: Secondary | ICD-10-CM | POA: Insufficient documentation

## 2018-04-19 DIAGNOSIS — Z79899 Other long term (current) drug therapy: Secondary | ICD-10-CM | POA: Diagnosis not present

## 2018-04-19 DIAGNOSIS — Z9225 Personal history of immunosupression therapy: Secondary | ICD-10-CM | POA: Diagnosis not present

## 2018-04-19 DIAGNOSIS — E039 Hypothyroidism, unspecified: Secondary | ICD-10-CM | POA: Insufficient documentation

## 2018-04-19 DIAGNOSIS — G4733 Obstructive sleep apnea (adult) (pediatric): Secondary | ICD-10-CM | POA: Diagnosis not present

## 2018-04-19 DIAGNOSIS — E1169 Type 2 diabetes mellitus with other specified complication: Secondary | ICD-10-CM | POA: Diagnosis not present

## 2018-04-19 LAB — CMP (CANCER CENTER ONLY)
ALT: 12 U/L (ref 0–44)
ANION GAP: 8 (ref 5–15)
AST: 13 U/L — ABNORMAL LOW (ref 15–41)
Albumin: 3.3 g/dL — ABNORMAL LOW (ref 3.5–5.0)
Alkaline Phosphatase: 123 U/L (ref 38–126)
BILIRUBIN TOTAL: 0.3 mg/dL (ref 0.3–1.2)
BUN: 28 mg/dL — ABNORMAL HIGH (ref 8–23)
CO2: 27 mmol/L (ref 22–32)
Calcium: 9.2 mg/dL (ref 8.9–10.3)
Chloride: 108 mmol/L (ref 98–111)
Creatinine: 1.83 mg/dL — ABNORMAL HIGH (ref 0.61–1.24)
GFR, EST NON AFRICAN AMERICAN: 38 mL/min — AB (ref >60)
GFR, Est AFR Am: 44 mL/min — ABNORMAL LOW (ref >60)
Glucose, Bld: 110 mg/dL — ABNORMAL HIGH (ref 70–99)
Potassium: 3.8 mmol/L (ref 3.5–5.1)
Sodium: 143 mmol/L (ref 135–145)
TOTAL PROTEIN: 6.4 g/dL — AB (ref 6.5–8.1)

## 2018-04-19 LAB — CBC WITH DIFFERENTIAL (CANCER CENTER ONLY)
ABS IMMATURE GRANULOCYTES: 0.02 10*3/uL (ref 0.00–0.07)
BASOS PCT: 1 %
Basophils Absolute: 0 10*3/uL (ref 0.0–0.1)
EOS ABS: 0.1 10*3/uL (ref 0.0–0.5)
Eosinophils Relative: 3 %
HCT: 34.7 % — ABNORMAL LOW (ref 39.0–52.0)
Hemoglobin: 11 g/dL — ABNORMAL LOW (ref 13.0–17.0)
Immature Granulocytes: 0 %
Lymphocytes Relative: 11 %
Lymphs Abs: 0.5 10*3/uL — ABNORMAL LOW (ref 0.7–4.0)
MCH: 28.8 pg (ref 26.0–34.0)
MCHC: 31.7 g/dL (ref 30.0–36.0)
MCV: 90.8 fL (ref 80.0–100.0)
MONO ABS: 0.7 10*3/uL (ref 0.1–1.0)
Monocytes Relative: 14 %
NEUTROS ABS: 3.4 10*3/uL (ref 1.7–7.7)
Neutrophils Relative %: 71 %
PLATELETS: 176 10*3/uL (ref 150–400)
RBC: 3.82 MIL/uL — AB (ref 4.22–5.81)
RDW: 15.2 % (ref 11.5–15.5)
WBC: 4.8 10*3/uL (ref 4.0–10.5)
nRBC: 0 % (ref 0.0–0.2)

## 2018-04-19 MED ORDER — IOHEXOL 300 MG/ML  SOLN
75.0000 mL | Freq: Once | INTRAMUSCULAR | Status: AC | PRN
  Administered 2018-04-19: 50 mL via INTRAVENOUS

## 2018-04-19 MED ORDER — SODIUM CHLORIDE (PF) 0.9 % IJ SOLN
INTRAMUSCULAR | Status: AC
  Filled 2018-04-19: qty 50

## 2018-04-22 ENCOUNTER — Ambulatory Visit (INDEPENDENT_AMBULATORY_CARE_PROVIDER_SITE_OTHER): Payer: Medicare Other

## 2018-04-22 ENCOUNTER — Encounter: Payer: Self-pay | Admitting: Internal Medicine

## 2018-04-22 ENCOUNTER — Inpatient Hospital Stay (HOSPITAL_BASED_OUTPATIENT_CLINIC_OR_DEPARTMENT_OTHER): Payer: Medicare Other | Admitting: Internal Medicine

## 2018-04-22 ENCOUNTER — Telehealth: Payer: Self-pay | Admitting: Internal Medicine

## 2018-04-22 VITALS — BP 136/75 | HR 80 | Temp 97.7°F | Resp 18 | Ht 73.0 in | Wt 292.5 lb

## 2018-04-22 DIAGNOSIS — R4702 Dysphasia: Secondary | ICD-10-CM

## 2018-04-22 DIAGNOSIS — M1990 Unspecified osteoarthritis, unspecified site: Secondary | ICD-10-CM | POA: Diagnosis not present

## 2018-04-22 DIAGNOSIS — C3490 Malignant neoplasm of unspecified part of unspecified bronchus or lung: Secondary | ICD-10-CM

## 2018-04-22 DIAGNOSIS — Z9221 Personal history of antineoplastic chemotherapy: Secondary | ICD-10-CM

## 2018-04-22 DIAGNOSIS — Z9989 Dependence on other enabling machines and devices: Secondary | ICD-10-CM

## 2018-04-22 DIAGNOSIS — Z9225 Personal history of immunosupression therapy: Secondary | ICD-10-CM | POA: Diagnosis not present

## 2018-04-22 DIAGNOSIS — G4733 Obstructive sleep apnea (adult) (pediatric): Secondary | ICD-10-CM

## 2018-04-22 DIAGNOSIS — J449 Chronic obstructive pulmonary disease, unspecified: Secondary | ICD-10-CM | POA: Diagnosis not present

## 2018-04-22 DIAGNOSIS — C3411 Malignant neoplasm of upper lobe, right bronchus or lung: Secondary | ICD-10-CM

## 2018-04-22 DIAGNOSIS — Z923 Personal history of irradiation: Secondary | ICD-10-CM

## 2018-04-22 DIAGNOSIS — Z95810 Presence of automatic (implantable) cardiac defibrillator: Secondary | ICD-10-CM

## 2018-04-22 DIAGNOSIS — I5022 Chronic systolic (congestive) heart failure: Secondary | ICD-10-CM | POA: Diagnosis not present

## 2018-04-22 DIAGNOSIS — R1310 Dysphagia, unspecified: Secondary | ICD-10-CM | POA: Diagnosis not present

## 2018-04-22 DIAGNOSIS — E039 Hypothyroidism, unspecified: Secondary | ICD-10-CM

## 2018-04-22 DIAGNOSIS — R1319 Other dysphagia: Secondary | ICD-10-CM

## 2018-04-22 DIAGNOSIS — E785 Hyperlipidemia, unspecified: Secondary | ICD-10-CM

## 2018-04-22 DIAGNOSIS — I110 Hypertensive heart disease with heart failure: Secondary | ICD-10-CM

## 2018-04-22 DIAGNOSIS — I4891 Unspecified atrial fibrillation: Secondary | ICD-10-CM | POA: Diagnosis not present

## 2018-04-22 DIAGNOSIS — Z79899 Other long term (current) drug therapy: Secondary | ICD-10-CM

## 2018-04-22 NOTE — Telephone Encounter (Signed)
Referral sent in proficient.

## 2018-04-22 NOTE — Telephone Encounter (Signed)
Scheduled appts per 01/20 los.  Gave patient the number for centeral radiology.  Printed calendar and avs.

## 2018-04-22 NOTE — Progress Notes (Signed)
EPIC Encounter for ICM Monitoring  Patient Name: Jason Russell is a 68 y.o. male Date: 04/22/2018 Primary Care Physican: Lucianne Lei, MD Primary Cardiologist:Ross Electrophysiologist: Caryl Comes Last Weight:284lbs Today's Weight: 284 lbs       Heart Failure questions reviewed.   Pt asymptomatic.  No changes in diet but may have been drinking more fluid than usual.   Report: Thoracic impedance abnormal suggesting fluid accumulation starting 04/20/2018.   Prescribed: Furosemide 40 mg 1 tablet (40 mg total) daily. Potassium 20 mEq 1 tablet daily.  Labs: 04/19/2018 Creatinine 1.83, BUN 28, Potassium 3.8, Sodium 143, eGFR 38-44 02/27/2018 Creatinine 1.81, BUN 25, Potassium 3.6, Sodium 141, eGFR 38-44 02/13/2018 Creatinine 1.81, BUN 20, Potassium 3.7, Sodium 140, eGFR 37-43 01/09/2018 Creatinine1.95, BUN30, Potassium3.5, [id number], [id number] 12/20/2017 Creatinine1.86, BUN35, Potassium3.6, [id number], [id number]  12/06/2017 Creatinine2.13, BUN36, Potassium3.4, [id number], JQDU43-83  11/22/2017 Creatinine1.67, BUN24, Potassium3.4, [id number], HKGO77-03 11/08/2017 Creatinine1.48, BUN24, Potassium3.5, [id number], HTMB31-12 10/25/2017 Creatinine1.82, BUN27, Potassium3.6, [id number], HKUV75-05  10/11/2017 Creatinine1.64, BUN25, Potassium4.2, [id number], GFQM21-03  09/27/2017 Creatinine1.65, BUN27, Potassium3.6, [id number], [id number]  A complete set of results can be found in results review  Recommendations:  Reinforced limiting salt intake to < 2000 mg daily and fluid intake to 64 oz daily.  Encouraged to call for fluid symptoms.  Follow-up plan: ICM clinic phone appointment on 04/30/2018 to recheck fluid levels.   Office appointment scheduled 05/20/2018 with Dr. Caryl Comes.    Copy of ICM check sent to Dr. Caryl Comes and Dr Harrington Challenger.   3 month ICM trend: 04/22/2018    1 Year ICM trend:       [name], RN 04/22/2018 11:01 AM

## 2018-04-22 NOTE — Progress Notes (Signed)
[facility] [phone]   [phone]  OFFICE PROGRESS NOTE  Lucianne Lei, MD [address] [place] [address]  DIAGNOSIS: Stage IIIA (T1a, N2, M0) non-small cell lung cancer, adenocarcinoma presented with right upper lobe lung nodule in addition to mediastinal lymphadenopathy diagnosed in March 2018.  Biomarker Findings Tumor Mutational Burden - TMB-Intermediate (8 Muts/Mb) Microsatellite Status - MS-Stable Genomic Findings For a complete list of the genes assayed, please refer to the Appendix. ERBB2 amplification - equivocal? DNMT3A K464f*192 FUBP1 Q40* KEAP1 [id number] TP53 C275F 7 Disease relevant genes with no reportable alterations: EGFR, KRAS, ALK, BRAF, MET, RET, ROS1   PRIOR THERAPY:  1) Course of concurrent chemoradiation with weekly carboplatin for AUC of 2 and paclitaxel 45 MG/M2. Status post 6 cycles. Last cycle was given 10/16/2016. 2)  Consolidation treatment with immunotherapy with Imfinzi (Durvalumab) 10 MG/M2 every 2 weeks. First dose 12/21/2016.  Status post 26 cycles.  CURRENT THERAPY: Observation.  INTERVAL HISTORY: [age]ugh[age] returns to the clinic today for follow-up visit.  The patient is feeling fine today with no concerning complaints except for difficulty swallowing.  Has been getting worse recently.  He denied having any odynophagia.  He has no chest pain, shortness of breath except with exertion with no cough or hemoptysis.  He denied having any fever or chills.  He has no nausea, vomiting, diarrhea or constipation.  He has no headache or visual changes.  He has been in observation.  He had repeat CT scan of the chest performed recently and is here for evaluation and discussion of his scan results.  MEDICAL HISTORY: Past Medical History:  Diagnosis Date  . Adenocarcinoma of right lung, stage 3 (HCSanta Rosa5/23/2018  . Adenocarcinoma of right lung, stage 3 (HCDelmar5/23/2018  . Anemia   .  Arthritis    "hx right hip"  . Asthma    "when I was a child"  . Atrial fibrillation (HCC)    Amiodarone started 10/2011; Coumadin  . 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 St. Jude ICD implantation 10/03/12.  . Chronic anticoagulation   . Chronic fatigue 10/18/2016  . Chronic fatigue 10/18/2016  . Chronic systolic heart failure ([facility]   a. Echo 7/13: EF 25%;  b. echo 04/2012:  Mild LVH, EF 30-35%, Gr 1 DD, mild AI, mild MR, mild LAE  . COPD (chronic obstructive pulmonary disease) (HCLyon  . Dyslipidemia   . Dysrhythmia   . Encounter for antineoplastic chemotherapy 08/24/2016  . Gout   . History of blood transfusion 10/15/2013   "don't know where the blood's going; HgB down to 5"  . Hyperlipidemia   . Hypertension   . Hypothyroidism   . NICM (nonischemic cardiomyopathy) (HCFort [facility]   [facility] [place]/14:  minimal CAD  . Obesity   . OSA on CPAP   . Tobacco abuse     ALLERGIES:  has No Known Allergies.  MEDICATIONS:  Current Outpatient Medications  Medication Sig Dispense Refill  . acetaminophen (TYLENOL) 500 MG tablet Take 1,000 mg by mouth every 6 (six) hours as needed for mild pain.    . Marland Kitchenlbuterol (PROVENTIL HFA;VENTOLIN HFA) 108 (90 Base) MCG/ACT inhaler INHALE 2 PUFFS INTO THE LUNGS EVERY 4 (FOUR) HOURS AS NEEDED FOR WHEEZING OR SHORTNESS OF BREATH 8.5 Inhaler 2  . amiodarone (PACERONE) 100 MG tablet Take 1 tablet (100 mg total) by mouth daily. 90 tablet 3  . atorvastatin (LIPITOR)  20 MG tablet TAKE 1 TABLET BY MOUTH EVERY DAY 90 tablet 2  . carvedilol (COREG) 12.5 MG tablet Take 1 tablet (12.5 mg total) by mouth 2 (two) times daily. 180 tablet 3  . [employer] D3 2000 units CAPS Take 2,000 Units by mouth daily.   11  . feeding supplement, ENSURE ENLIVE, (ENSURE ENLIVE) LIQD Take 237 mLs by mouth 2 (two) times daily between meals. 237 mL 12  . ferrous gluconate (FERGON) 324 MG tablet Take 1 tablet (324 mg total) by mouth 3 (three) times daily with meals. 90 tablet 7  .  Fluticasone-Umeclidin-Vilant (TRELEGY ELLIPTA) 100-62.5-25 MCG/INH AEPB Inhale 1 puff into the lungs daily. 3 each 3  . furosemide (LASIX) 40 MG tablet Take 1 tablet (40 mg total) by mouth daily. 30 tablet 6  . hydrALAZINE (APRESOLINE) 100 MG tablet TAKE 1 TABLET BY MOUTH 3 (THREE) TIMES DAILY. (Patient taking differently: Take 100 mg by mouth 3 (three) times daily. TAKE 1 TABLET BY MOUTH 3 (THREE) TIMES DAILY.) 270 tablet 3  . levothyroxine (SYNTHROID, LEVOTHROID) 50 MCG tablet TAKE 1 TABLET BY MOUTH DAILY BEFORE BREAKFAST (Patient taking differently: Take 50 mcg by mouth daily before breakfast. ) 90 tablet 3  . lidocaine-prilocaine (EMLA) cream APPLY GENEROUS AMOUNT TO PORT SITE AT LEAST 1 HR PRIOR TO TREATMENT. DO NOT RUB IN. (Patient taking differently: Apply 1 application topically See admin instructions. Apply generous amount to port site at least 1 hr prior to treatment.  DO NOT RUB IN.) 30 g 1  . lisinopril (PRINIVIL,ZESTRIL) 40 MG tablet Take 1 tablet (40 mg total) by mouth daily. 90 tablet 2  . magnesium oxide (MAG-OX) 400 (241.3 Mg) MG tablet Take 1 tablet (400 mg total) by mouth daily. 30 tablet 0  . Polyvinyl Alcohol-Povidone (REFRESH OP) Place 1 drop into both eyes every morning.    . Potassium Chloride ER 20 MEQ TBCR TAKE 1 TABLET BY MOUTH EVERY DAY 90 tablet 2  . prochlorperazine (COMPAZINE) 10 MG tablet Take 1 tablet (10 mg total) by mouth every 6 (six) hours as needed for nausea or vomiting. 30 tablet 0  . rivaroxaban (XARELTO) 20 MG TABS tablet Take 1 tablet (20 mg total) by mouth daily. 90 tablet 2  . sildenafil (REVATIO) 20 MG tablet Take as directed. Take 2-5 tablets by mouth as needed 30 tablet 1  . spironolactone (ALDACTONE) 25 MG tablet Take 1 tablet (25 mg total) by mouth daily. 30 tablet 6  . sucralfate (CARAFATE) 1 g tablet TAKE 1 TABLET BY MOUTH FOUR TIMES DAILY (5 MINUTES BEFORE MEALS AND AT BEDTIME) FOR ESOPHAGITIS (Patient taking differently: Take 1 g by mouth 3 (three)  times daily before meals. DISOLVE 1 TABLET IN HOT WATER AND TAKE THREE TIMES DAILY (5 MINUTES BEFORE MEALS AND AT BEDTIME) FOR ESOPHAGITIS) 120 tablet 0  . ULORIC 40 MG tablet Take 1 tablet (40 mg total) by mouth daily. 30 tablet 0   No current facility-administered medications for this visit.     SURGICAL HISTORY:  Past Surgical History:  Procedure Laterality Date  . CARDIAC DEFIBRILLATOR PLACEMENT  2014  . CARDIOVERSION  2011  . COLONOSCOPY WITH PROPOFOL Left 10/17/2013   Procedure: COLONOSCOPY WITH PROPOFOL;  Surgeon: Inda Castle, MD;  Location: [facility];  Service: Endoscopy;  Laterality: Left;  . ESOPHAGOGASTRODUODENOSCOPY N/A 10/17/2013   Procedure: ESOPHAGOGASTRODUODENOSCOPY (EGD);  Surgeon: Inda Castle, MD;  Location: [facility];  Service: Endoscopy;  Laterality: N/A;  . GIVENS CAPSULE STUDY N/A 10/29/2013  Procedure: GIVENS CAPSULE STUDY;  Surgeon: Inda Castle, MD;  Location: [facility];  Service: Endoscopy;  Laterality: N/A;  . ICD GENERATOR CHANGEOUT N/A 02/13/2018   Procedure: ICD GENERATOR CHANGEOUT;  Surgeon: Constance Haw, MD;  Location: [facility];  Service: Cardiovascular;  Laterality: N/A;  . IMPLANTABLE CARDIOVERTER DEFIBRILLATOR IMPLANT Left 10/03/2012   Procedure: IMPLANTABLE CARDIOVERTER DEFIBRILLATOR IMPLANT;  Surgeon: Deboraha Sprang, MD;  [facility];  Service: Cardiovascular;  Laterality: Left;  . IR FLUORO GUIDE PORT INSERTION RIGHT  09/21/2016  . IR US GUIDE VASC ACCESS RIGHT  09/21/2016  . JOINT REPLACEMENT    . TONSILLECTOMY  1950's  . TOTAL HIP ARTHROPLASTY Right 11/25/1997  . VIDEO BRONCHOSCOPY WITH ENDOBRONCHIAL ULTRASOUND N/A 08/09/2016   Procedure: VIDEO BRONCHOSCOPY WITH ENDOBRONCHIAL ULTRASOUND;  Surgeon: Javier Glazier, MD;  Location: [facility];  Service: Thoracic;  Laterality: N/A;    REVIEW OF SYSTEMS:  A comprehensive review of systems was negative except for: Respiratory: positive for dyspnea on  exertion Gastrointestinal: positive for dysphagia   PHYSICAL EXAMINATION: General appearance: alert, cooperative and no distress Head: Normocephalic, without obvious abnormality, atraumatic Neck: no adenopathy, no JVD, supple, symmetrical, trachea midline and thyroid not enlarged, symmetric, no tenderness/mass/nodules Lymph nodes: Cervical, supraclavicular, and axillary nodes normal. Resp: clear to auscultation bilaterally Back: symmetric, no curvature. ROM normal. No CVA tenderness. Cardio: regular rate and rhythm, S1, S2 normal, no murmur, click, rub or gallop GI: soft, non-tender; bowel sounds normal; no masses,  no organomegaly Extremities: extremities normal, atraumatic, no cyanosis or edema  ECOG PERFORMANCE STATUS: 1 - Symptomatic but completely ambulatory  Blood pressure 136/75, pulse 80, temperature 97.7 F (36.5 C), temperature source Oral, resp. rate 18, height '6\' 1"'$  (1.854 m), weight 292 lb 8 oz (132.7 kg), SpO2 100 %.  LABORATORY DATA: Lab Results  Component Value Date   WBC 4.8 04/19/2018   HGB 11.0 (L) 04/19/2018   HCT 34.7 (L) 04/19/2018   MCV 90.8 04/19/2018   PLT 176 04/19/2018      Chemistry      Component Value Date/Time   NA 143 04/19/2018 1145   NA 141 02/27/2018 0855   NA 141 03/29/2017 1038   K 3.8 04/19/2018 1145   K 4.0 03/29/2017 1038   CL 108 04/19/2018 1145   CO2 27 04/19/2018 1145   CO2 27 03/29/2017 1038   BUN 28 (H) 04/19/2018 1145   BUN 25 02/27/2018 0855   BUN 28.6 (H) 03/29/2017 1038   CREATININE 1.83 (H) 04/19/2018 1145   CREATININE 1.6 (H) 03/29/2017 1038      Component Value Date/Time   CALCIUM 9.2 04/19/2018 1145   CALCIUM 9.3 03/29/2017 1038   ALKPHOS 123 04/19/2018 1145   ALKPHOS 105 03/29/2017 1038   AST 13 (L) 04/19/2018 1145   AST 18 03/29/2017 1038   ALT 12 04/19/2018 1145   ALT 13 03/29/2017 1038   BILITOT 0.3 04/19/2018 1145   BILITOT 0.62 03/29/2017 1038       RADIOGRAPHIC STUDIES: Ct Chest W  Contrast  Result Date: 04/19/2018 CLINICAL DATA:  Lung cancer. Chemotherapy, radiation therapy and immunotherapy complete. EXAM: CT CHEST WITH CONTRAST TECHNIQUE: Multidetector CT imaging of the chest was performed during intravenous contrast administration. CONTRAST:  59m OMNIPAQUE IOHEXOL 300 MG/ML  SOLN COMPARISON:  01/09/2018. FINDINGS: Cardiovascular: Right IJ Port-A-Cath terminates in the SVC. Atherosclerotic calcification of the aorta. Ascending aorta measures 4.4 cm (series 6, image 74). Heart is at the upper limits of  normal in size. No pericardial effusion. Mediastinum/Nodes: No pathologically enlarged mediastinal, hilar or axillary lymph nodes. Upper esophagus is mildly dilated and contains food debris. Lungs/Pleura: Centrilobular and paraseptal emphysema. Radiation changes in the medial right hemithorax, stable. Ground-glass nodule in the apical right upper lobe measures 4 mm, unchanged. Left lung is clear. No pleural fluid. Airway is unremarkable. Upper Abdomen: Visualized portions of the liver, gallbladder and adrenal glands are unremarkable. Stones are seen in the right kidney. Visualized portions of the kidneys, spleen, pancreas, stomach and bowel are otherwise unremarkable. No upper abdominal adenopathy. Musculoskeletal: Degenerative changes in the spine. No worrisome lytic or sclerotic lesions. IMPRESSION: 1. Post treatment changes in the right hemithorax without evidence of recurrent or metastatic disease. 2. Upper esophagus is mildly dilated and contains food debris. Please correlate clinically for dysphagia. 3. Right renal stones. 4. 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 5. Recommend annual imaging followup by CTA or MRA. This recommendation follows 2010 ACCF/AHA/AATS/ACR/ASA/SCA/SCAI/SIR/STS/SVM Guidelines for the Diagnosis and Management of Patients with Thoracic Aortic Disease. Circulation. 2010; 121: Q657-Q469. Aortic aneurysm NOS (ICD10-I71.9) 6. Aortic atherosclerosis  (ICD10-170.0). 7.  Emphysema (ICD10-J43.9). Electronically Signed   By: Lorin Picket M.D.   On: 04/19/2018 13:59    ASSESSMENT AND PLAN:  This is a very pleasant 68 years old African-American male with stage IIIA non-small cell lung cancer, adenocarcinoma.  The patient completed 6 weeks of concurrent chemoradiation with weekly carboplatin and paclitaxel and tolerated his treatment well except for odynophagia. The patient was then started on consolidation treatment with immunotherapy with Imfinzi (Durvalumab) status post 26 cycles.  The patient is currently on observation and he is feeling fine. The only complaint he has is shortness of breath with exertion as well as dysphagia. He had repeat CT scan of the chest performed recently that showed no concerning findings for disease recurrence but there was mildly dilated upper esophagus consistent with the dysphagia.  I will refer the patient to gastroenterology for evaluation and consideration of dilatation of the esophagus. I recommended for the patient to continue on observation with repeat CT scan of the chest in 3 months. He was advised to call immediately if he has any concerning symptoms in the interval. The patient voices understanding of current disease status and treatment options and is in agreement with the current care plan. All questions were answered. The patient knows to call the clinic with any problems, questions or concerns. We can certainly see the patient much sooner if necessary. Disclaimer: This note was dictated with voice recognition software. Similar sounding words can inadvertently be transcribed and may not be corrected upon review.

## 2018-04-24 ENCOUNTER — Encounter: Payer: Self-pay | Admitting: Gastroenterology

## 2018-04-26 ENCOUNTER — Telehealth: Payer: Medicare Other | Admitting: Family

## 2018-04-26 DIAGNOSIS — J029 Acute pharyngitis, unspecified: Secondary | ICD-10-CM

## 2018-04-26 NOTE — Progress Notes (Signed)
Thank you for the details you included in the comment boxes. Those details are very helpful in determining the best course of treatment for you and help [place] to provide the best care.  We are sorry that you are not feeling well.  Here is how we plan to help!  Your symptoms indicate a likely viral infection (Pharyngitis).   Pharyngitis is inflammation in the back of the throat which can cause a sore throat, scratchiness and sometimes difficulty swallowing.   Pharyngitis is typically caused by a respiratory virus and will just run its course.  Please keep in mind that your symptoms could last up to 10 days.  For throat pain, we recommend over the counter oral pain relief medications such as acetaminophen or aspirin, or anti-inflammatory medications such as ibuprofen or naproxen sodium.  Topical treatments such as oral throat lozenges or sprays may be used as needed.  Avoid close contact with loved ones, especially the very young and elderly.  Remember to wash your hands thoroughly throughout the day as this is the number one way to prevent the spread of infection and wipe down door knobs and counters with disinfectant.  After careful review of your answers, I would not recommend and antibiotic for your condition.  Antibiotics should not be used to treat conditions that we suspect are caused by viruses like the virus that causes the common cold or flu. However, some people can have Strep with atypical symptoms. You may need formal testing in clinic or office to confirm if your symptoms continue or worsen.  Providers prescribe antibiotics to treat infections caused by bacteria. Antibiotics are very powerful in treating bacterial infections when they are used properly.  To maintain their effectiveness, they should be used only when necessary.  Overuse of antibiotics has resulted in the development of super bugs that are resistant to treatment!    Home Care:  Only take medications as instructed by your medical  team.  Do not drink alcohol while taking these medications.  A steam or ultrasonic humidifier can help congestion.  You can place a towel over your head and breathe in the steam from hot water coming from a faucet.  Avoid close contacts especially the very young and the elderly.  Cover your mouth when you cough or sneeze.  Always remember to wash your hands.  Get Help Right Away If:  You develop worsening fever or throat pain.  You develop a severe head ache or visual changes.  Your symptoms persist after you have completed your treatment plan.  Make sure you  Understand these instructions.  Will watch your condition.  Will get help right away if you are not doing well or get worse.  Your e-visit answers were reviewed by a board certified advanced clinical practitioner to complete your personal care plan.  Depending on the condition, your plan could have included both over the counter or prescription medications.  If there is a problem please reply  once you have received a response from your provider.  Your safety is important to [place].  If you have drug allergies check your prescription carefully.    You can use MyChart to ask questions about todays visit, request a non-urgent call back, or ask for a work or school excuse for 24 hours related to this e-Visit. If it has been greater than 24 hours you will need to follow up with your provider, or enter a new e-Visit to address those concerns.  You will get an e-mail  in the next two days asking about your experience.  I hope that your e-visit has been valuable and will speed your recovery. Thank you for using e-visits.

## 2018-04-27 LAB — CUP PACEART REMOTE DEVICE CHECK
Battery Remaining Percentage: 94 %
Battery Voltage: 3.2 V
HIGH POWER IMPEDANCE MEASURED VALUE: 37 Ohm
HIGH POWER IMPEDANCE MEASURED VALUE: 37 Ohm
Implantable Lead Implant Date: 20140703
Implantable Pulse Generator Implant Date: 20191113
Lead Channel Impedance Value: 410 Ohm
Lead Channel Sensing Intrinsic Amplitude: 6.4 mV
Lead Channel Setting Pacing Amplitude: 2.5 V
Lead Channel Setting Pacing Pulse Width: 0.5 ms
MDC IDC LEAD LOCATION: 753860
MDC IDC MSMT BATTERY REMAINING LONGEVITY: 101 mo
MDC IDC MSMT LEADCHNL RV PACING THRESHOLD AMPLITUDE: 0.75 V
MDC IDC MSMT LEADCHNL RV PACING THRESHOLD PULSEWIDTH: 0.5 ms
MDC IDC PG SERIAL: 9820959
MDC IDC SESS DTM: 20191212070017
MDC IDC SET LEADCHNL RV SENSING SENSITIVITY: 0.5 mV
MDC IDC STAT BRADY RV PERCENT PACED: 1 %

## 2018-04-30 ENCOUNTER — Ambulatory Visit (INDEPENDENT_AMBULATORY_CARE_PROVIDER_SITE_OTHER): Payer: Medicare Other

## 2018-04-30 DIAGNOSIS — Z95810 Presence of automatic (implantable) cardiac defibrillator: Secondary | ICD-10-CM

## 2018-04-30 DIAGNOSIS — I5022 Chronic systolic (congestive) heart failure: Secondary | ICD-10-CM

## 2018-04-30 NOTE — Progress Notes (Signed)
EPIC Encounter for ICM Monitoring  Patient Name: Jason Russell is a 68 y.o. male Date: 04/30/2018 Primary Care Physican: Lucianne Lei, MD Primary Cardiologist:Ross Electrophysiologist: Caryl Comes Last Weight:284lbs Today's Weight: 284 lbs                                                           Heart Failure questions reviewed.   Pt asymptomatic.    Report: Thoracic impedance returned to normal since last ICM remote transmission  Prescribed: Furosemide 40 mg 1 tablet (40 mg total) daily. Potassium 20 mEq 1 tablet daily.  Labs: 04/19/2018 Creatinine 1.83, BUN 28, Potassium 3.8, Sodium 143, eGFR 38-44 02/27/2018 Creatinine 1.81, BUN 25, Potassium 3.6, Sodium 141, eGFR 38-44 02/13/2018 Creatinine 1.81, BUN 20, Potassium 3.7, Sodium 140, eGFR 37-43 01/09/2018 Creatinine1.95, BUN30, Potassium3.5, [id number], [id number] 12/20/2017 Creatinine1.86, BUN35, Potassium3.6, [id number], [id number]  12/06/2017 Creatinine2.13, BUN36, Potassium3.4, [id number], [id number]  11/22/2017 Creatinine1.67, BUN24, Potassium3.4, [id number], [id number] 11/08/2017 Creatinine1.48, BUN24, Potassium3.5, [id number], [id number] 10/25/2017 Creatinine1.82, BUN27, Potassium3.6, [id number], [id number]  10/11/2017 Creatinine1.64, BUN25, Potassium4.2, [id number], [id number]  09/27/2017 Creatinine1.65, BUN27, Potassium3.6, [id number], [id number]  A complete set of results can be found in results review  Recommendations:    Encouraged to call for fluid symptoms.  Follow-up plan: ICM clinic phone appointment on 06/24/2018.   Office appointment scheduled 05/20/2018 with Dr. Caryl Comes.    Copy of ICM check sent to Dr. Caryl Comes.   3 month ICM trend: 04/30/2018    1 Year ICM trend:       [name], RN 04/30/2018 6:33 PM

## 2018-05-15 ENCOUNTER — Encounter: Payer: Self-pay | Admitting: Gastroenterology

## 2018-05-15 ENCOUNTER — Ambulatory Visit (INDEPENDENT_AMBULATORY_CARE_PROVIDER_SITE_OTHER): Payer: Medicare Other | Admitting: Gastroenterology

## 2018-05-15 ENCOUNTER — Telehealth: Payer: Self-pay

## 2018-05-15 VITALS — BP 116/60 | HR 72 | Ht 70.75 in | Wt 286.2 lb

## 2018-05-15 DIAGNOSIS — I428 Other cardiomyopathies: Secondary | ICD-10-CM

## 2018-05-15 DIAGNOSIS — R1310 Dysphagia, unspecified: Secondary | ICD-10-CM

## 2018-05-15 DIAGNOSIS — Z7901 Long term (current) use of anticoagulants: Secondary | ICD-10-CM | POA: Diagnosis not present

## 2018-05-15 DIAGNOSIS — R933 Abnormal findings on diagnostic imaging of other parts of digestive tract: Secondary | ICD-10-CM

## 2018-05-15 DIAGNOSIS — R1319 Other dysphagia: Secondary | ICD-10-CM

## 2018-05-15 NOTE — Progress Notes (Signed)
[facility] Consult Note:  History: Jason Russell 05/15/2018  Referring physician: Lucianne Lei, MD  Reason for consult/chief complaint: Dysphagia (lung cancer, cancer Dr stopped Carafte, esophagus closing due to radiation, intermittent trouble with liquids and solids) and Cough (with some blood mixed in with phlegm)   Subjective  HPI:  This is a very pleasant 68 year old man referred by his oncologist for dysphagia.  Seen by [facility] during hospital stay in July 2015 for iron deficiency anemia.  EGD, colonoscopy and video capsule study performed.  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 I reviewed his most recent oncology office note from January 20, at which time Dr. Earlie Russell outlined the treatment for his lung cancer, which was diagnosed in March 2018.  Jason Russell had concurrent chemoradiation in July 2018, consolidation immunotherapy given fall 2018. I also reviewed his last cardiology office note from March 11, 2018, outlining history of hypertension, paroxysmal A. fib, systolic CHF and AICD.  Jason Russell has had dysphagia since his radiation therapy, and it has been getting steadily worse over the last year.  It occurs primarily to solids, sometimes medications if he takes too many at once.  His appetite remains good and his weight stable. ROS:  Review of Systems  Constitutional: Negative for appetite change and unexpected weight change.  HENT: Negative for mouth sores and voice change.   Eyes: Negative for pain and redness.  Respiratory: Positive for shortness of breath. Negative for cough.   Cardiovascular: Negative for chest pain and palpitations.  Genitourinary: Negative for dysuria and hematuria.  Musculoskeletal: Negative for arthralgias and myalgias.  Skin: Negative for pallor and rash.  Neurological: Negative for weakness and headaches.  Hematological: Negative for adenopathy.     Past Medical History: Past Medical History:  Diagnosis Date    . Adenocarcinoma of right lung, stage 3 ([place]) 08/23/2016  . Adenocarcinoma of right lung, stage 3 ([place]) 08/23/2016  . Anemia   . Arthritis    "hx right hip"  . Asthma    "when I was a child"  . Atrial fibrillation (HCC)    Amiodarone started 10/2011; Coumadin  . 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 Jason Russell ICD implantation 10/03/12.  . Chronic anticoagulation   . Chronic fatigue 10/18/2016  . Chronic fatigue 10/18/2016  . Chronic systolic heart failure ([place])    a. Echo 7/13: EF 25%;  b. echo 04/2012:  Mild LVH, EF 30-35%, Gr 1 DD, mild AI, mild MR, mild LAE  . COPD (chronic obstructive pulmonary disease) ([place])   . Dyslipidemia   . Dysrhythmia   . Encounter for antineoplastic chemotherapy 08/24/2016  . Gout   . History of blood transfusion 10/15/2013   "don't know where the blood's going; HgB down to 5"  . Hyperlipidemia   . Hypertension   . Hypothyroidism   . NICM (nonischemic cardiomyopathy) ([place])    [place] 4/14:  minimal CAD  . Obesity   . OSA on CPAP   . Tobacco abuse      Past Surgical History: Past Surgical History:  Procedure Laterality Date  . CARDIAC DEFIBRILLATOR PLACEMENT  2014  . CARDIOVERSION  2011  . COLONOSCOPY WITH PROPOFOL Left 10/17/2013   Procedure: COLONOSCOPY WITH PROPOFOL;  Surgeon: Jason Castle, MD;  Location: [facility];  Service: Endoscopy;  Laterality: Left;  . ESOPHAGOGASTRODUODENOSCOPY N/A 10/17/2013   Procedure: ESOPHAGOGASTRODUODENOSCOPY (EGD);  Surgeon: Jason Castle, MD;  Location: [facility];  Service: Endoscopy;  Laterality: N/A;  . GIVENS CAPSULE STUDY  N/A 10/29/2013   Procedure: GIVENS CAPSULE STUDY;  Surgeon: Jason Castle, MD;  Location: [facility];  Service: Endoscopy;  Laterality: N/A;  . ICD GENERATOR CHANGEOUT N/A 02/13/2018   Procedure: ICD GENERATOR CHANGEOUT;  Surgeon: Jason Haw, MD;  Location: [facility];  Service: Cardiovascular;  Laterality: N/A;  . IMPLANTABLE  CARDIOVERTER DEFIBRILLATOR IMPLANT Left 10/03/2012   Procedure: IMPLANTABLE CARDIOVERTER DEFIBRILLATOR IMPLANT;  Surgeon: Jason Sprang, MD;  Location: [facility] CATH LAB;  Service: Cardiovascular;  Laterality: Left;  . IR FLUORO GUIDE PORT INSERTION RIGHT  09/21/2016  . IR US GUIDE VASC ACCESS RIGHT  09/21/2016  . JOINT REPLACEMENT    . TONSILLECTOMY  1950's  . TOTAL HIP ARTHROPLASTY Right 11/25/1997  . VIDEO BRONCHOSCOPY WITH ENDOBRONCHIAL ULTRASOUND N/A 08/09/2016   Procedure: VIDEO BRONCHOSCOPY WITH ENDOBRONCHIAL ULTRASOUND;  Surgeon: Jason Glazier, MD;  Location: [facility];  Service: Thoracic;  Laterality: N/A;     Family History: Family History  Problem Relation Age of Onset  . Hypertension Mother   . Heart Problems Mother   . Other Father        deceased- unknow reason  . Lung cancer Brother     Social History: Social History   Socioeconomic History  . Marital status: Married    Spouse name: Not on file  . Number of children: 1  . Years of education: Not on file  . Highest education level: Not on file  Occupational History  . Occupation: retired    Fish farm manager: [employer]  Social Needs  . Financial resource strain: Not on file  . Food insecurity:    Worry: Not on file    Inability: Not on file  . Transportation needs:    Medical: Not on file    Non-medical: Not on file  Tobacco Use  . Smoking status: Former Smoker    Packs/day: 0.25    Years: 45.00    Pack years: 11.25    Types: Cigarettes    Last attempt to quit: 07/11/2016    Years since quitting: 1.8  . Smokeless tobacco: Never Used  Substance and Sexual Activity  . Alcohol use: Yes    Alcohol/week: 1.0 standard drinks    Types: 1 Cans of beer per week  . Drug use: No  . Sexual activity: Not Currently  Lifestyle  . Physical activity:    Days per week: Not on file    Minutes per session: Not on file  . Stress: Not on file  Relationships  . Social connections:    Talks on phone: More than three times a  week    Gets together: Twice a week    Attends religious service: Patient refused    Active member of club or organization: Patient refused    Attends meetings of clubs or organizations: Patient refused    Relationship status: Married  Other Topics Concern  . Not on file  Social History Narrative  . Not on file    Allergies: No Known Allergies  Outpatient Meds: Current Outpatient Medications  Medication Sig Dispense Refill  . acetaminophen (TYLENOL) 500 MG tablet Take 1,000 mg by mouth every 6 (six) hours as needed for mild pain.    [name] albuterol (PROVENTIL HFA;VENTOLIN HFA) 108 (90 Base) MCG/ACT inhaler INHALE 2 PUFFS INTO THE LUNGS EVERY 4 (FOUR) HOURS AS NEEDED FOR WHEEZING OR SHORTNESS OF BREATH 8.5 Inhaler 2  . amiodarone (PACERONE) 100 MG tablet Take 1 tablet (100 mg total) by mouth  daily. 90 tablet 3  . atorvastatin (LIPITOR) 20 MG tablet TAKE 1 TABLET BY MOUTH EVERY DAY 90 tablet 2  . carvedilol (COREG) 12.5 MG tablet Take 1 tablet (12.5 mg total) by mouth 2 (two) times daily. 180 tablet 3  . [employer] D3 2000 units CAPS Take 2,000 Units by mouth daily.   11  . feeding supplement, ENSURE ENLIVE, (ENSURE ENLIVE) LIQD Take 237 mLs by mouth 2 (two) times daily between meals. 237 mL 12  . ferrous gluconate (FERGON) 324 MG tablet Take 1 tablet (324 mg total) by mouth 3 (three) times daily with meals. 90 tablet 7  . Fluticasone-Umeclidin-Vilant (TRELEGY ELLIPTA) 100-62.5-25 MCG/INH AEPB Inhale 1 puff into the lungs daily. 3 each 3  . furosemide (LASIX) 40 MG tablet Take 1 tablet (40 mg total) by mouth daily. 30 tablet 6  . hydrALAZINE (APRESOLINE) 100 MG tablet TAKE 1 TABLET BY MOUTH 3 (THREE) TIMES DAILY. (Patient taking differently: Take 100 mg by mouth 3 (three) times daily. TAKE 1 TABLET BY MOUTH 3 (THREE) TIMES DAILY.) 270 tablet 3  . levothyroxine (SYNTHROID, LEVOTHROID) 50 MCG tablet TAKE 1 TABLET BY MOUTH DAILY BEFORE BREAKFAST (Patient taking differently: Take 50 mcg by mouth daily  before breakfast. ) 90 tablet 3  . lidocaine-prilocaine (EMLA) cream APPLY GENEROUS AMOUNT TO PORT SITE AT LEAST 1 HR PRIOR TO TREATMENT. DO NOT RUB IN. (Patient taking differently: Apply 1 application topically See admin instructions. Apply generous amount to port site at least 1 hr prior to treatment.  DO NOT RUB IN.) 30 g 1  . lisinopril (PRINIVIL,ZESTRIL) 40 MG tablet Take 1 tablet (40 mg total) by mouth daily. 90 tablet 2  . magnesium oxide (MAG-OX) 400 (241.3 Mg) MG tablet Take 1 tablet (400 mg total) by mouth daily. 30 tablet 0  . Polyvinyl Alcohol-Povidone (REFRESH OP) Place 1 drop into both eyes every morning.    . Potassium Chloride ER 20 MEQ TBCR TAKE 1 TABLET BY MOUTH EVERY DAY 90 tablet 2  . prochlorperazine (COMPAZINE) 10 MG tablet Take 1 tablet (10 mg total) by mouth every 6 (six) hours as needed for nausea or vomiting. 30 tablet 0  . rivaroxaban (XARELTO) 20 MG TABS tablet Take 1 tablet (20 mg total) by mouth daily. 90 tablet 2  . sildenafil (REVATIO) 20 MG tablet Take as directed. Take 2-5 tablets by mouth as needed 30 tablet 1  . spironolactone (ALDACTONE) 25 MG tablet Take 1 tablet (25 mg total) by mouth daily. 30 tablet 6  . ULORIC 40 MG tablet Take 1 tablet (40 mg total) by mouth daily. 30 tablet 0   No current facility-administered medications for this visit.       ___________________________________________________________________ Objective   Exam:  BP 116/60 (BP Location: Left Arm, Patient Position: Sitting, Cuff Size: Large)   Pulse 72 Comment: irregular  Ht 5' 10.75" (1.797 m) Comment: height measured without shoes  Wt 286 lb 4 oz (129.8 kg)   BMI 40.21 kg/m    General: Well-appearing man, normal vocal quality  Eyes: sclera anicteric, no redness  ENT: oral mucosa moist without lesions, no cervical or supraclavicular lymphadenopathy  CV: RRR without murmur, S1/S2, no JVD, mild peripheral edema.  AICD upper chest wall  Resp: clear to auscultation  bilaterally, normal RR and effort noted  GI: soft, no tenderness, with active bowel sounds. No guarding or palpable organomegaly noted, limited by body habitus  Skin; warm and dry, no rash or jaundice noted  Neuro: awake, alert  and oriented x 3. Normal gross motor function and fluent speech  Labs:  CBC Latest Ref Rng & Units 04/19/2018 02/13/2018 01/09/2018  WBC 4.0 - 10.5 K/uL 4.8 5.6 4.7  Hemoglobin 13.0 - 17.0 g/dL 11.0(L) 13.3 11.8(L)  Hematocrit 39.0 - 52.0 % 34.7(L) 42.2 36.3(L)  Platelets 150 - 400 K/uL 176 174 150   CMP Latest Ref Rng & Units 04/19/2018 02/27/2018 02/13/2018  Glucose 70 - 99 mg/dL 110(H) 106(H) -  BUN 8 - 23 mg/dL 28(H) 25 -  Creatinine 0.61 - 1.24 mg/dL 1.83(H) 1.81(H) -  Sodium 135 - 145 mmol/L 143 141 -  Potassium 3.5 - 5.1 mmol/L 3.8 3.6 3.7  Chloride 98 - 111 mmol/L 108 102 -  CO2 22 - 32 mmol/L 27 22 -  Calcium 8.9 - 10.3 mg/dL 9.2 9.1 -  Total Protein 6.5 - 8.1 g/dL 6.4(L) - -  Total Bilirubin 0.3 - 1.2 mg/dL 0.3 - -  Alkaline Phos 38 - 126 U/L 123 - -  AST 15 - 41 U/L 13(L) - -  ALT 0 - 44 U/L 12 - -     Radiologic Studies:  CT scan chest April 19, 2018;  FINDINGS: Cardiovascular: Right IJ Port-A-Cath terminates in the SVC. Atherosclerotic calcification of the aorta. Ascending aorta measures 4.4 cm (series 6, image 74). Heart is at the upper limits of normal in size. No pericardial effusion.   Mediastinum/Nodes: No pathologically enlarged mediastinal, hilar or axillary lymph nodes. Upper esophagus is mildly dilated and contains food debris.   Lungs/Pleura: Centrilobular and paraseptal emphysema. Radiation changes in the medial right hemithorax, stable. Ground-glass nodule in the apical right upper lobe measures 4 mm, unchanged. Left lung is clear. No pleural fluid. Airway is unremarkable.   Upper Abdomen: Visualized portions of the liver, gallbladder and adrenal glands are unremarkable. Stones are seen in the right kidney.  Visualized portions of the kidneys, spleen, pancreas, stomach and bowel are otherwise unremarkable. No upper abdominal adenopathy.   Musculoskeletal: Degenerative changes in the spine. No worrisome lytic or sclerotic lesions.   IMPRESSION: 1. Post treatment changes in the right hemithorax without evidence of recurrent or metastatic disease. 2. Upper esophagus is mildly dilated and contains food debris. Please correlate clinically for dysphagia. 3. Right renal stones. 4. 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 5. Recommend annual imaging followup by CTA or MRA. This recommendation follows 2010 ACCF/AHA/AATS/ACR/ASA/SCA/SCAI/SIR/STS/SVM Guidelines for the Diagnosis and Management of Patients with Thoracic Aortic Disease. Circulation. 2010; 121: X096-E454. Aortic aneurysm NOS (ICD10-I71.9) 6. Aortic atherosclerosis (ICD10-170.0). 7.  Emphysema (ICD10-J43.9). ____________________________________________ I personally reviewed the CT images    Last echocardiogram October 2018, LV ejection fraction 35 to 40%   assessment: Encounter Diagnoses  Name Primary?  . Esophageal dysphagia Yes  . Abnormal finding on GI tract imaging   . Nonischemic cardiomyopathy ([place])   . 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  I suspect it may be a fairly tight stricture, with the proximal dilatation and retained food debris.   Plan:  In order to better define the stricture and plan dilation that might need fluoroscopy, I have sent him for a barium swallow.  After that, he will have an upper endoscopy in the hospital endoscopy lab.  It will likely require special scope and/or equipment.  He is also at increased risk of procedure related complications due to his cardiac condition and use of anticoagulation.  Risks of the procedure were reviewed in detail and  he is agreeable.  The benefits and risks of the planned procedure were  described in detail with the patient or (when appropriate) their health care proxy.  Risks were outlined as including, but not limited to, bleeding, infection, perforation, adverse medication reaction leading to cardiac or pulmonary decompensation, or pancreatitis (if ERCP).  The limitation of incomplete mucosal visualization was also discussed.  No guarantees or warranties were given.  He will need to be off [place] 2 days prior, and we will clear this with his cardiologist.  Thank you for the courtesy of this consult.  Please call me with any questions or concerns.  Nelida Meuse III  CC: Referring provider noted above

## 2018-05-15 NOTE — Patient Instructions (Signed)
If you are age 67 or older, your body mass index should be between 23-30. Your Body mass index is 40.21 kg/m. If this is out of the aforementioned range listed, please consider follow up with your Primary Care Provider.  If you are age 26 or younger, your body mass index should be between 19-25. Your Body mass index is 40.21 kg/m. If this is out of the aformentioned range listed, please consider follow up with your Primary Care Provider.   You have been scheduled for a Barium Esophogram at [facility] (1st floor of the hospital) on 05-24-2018 at 930am. Please arrive 15 minutes prior to your appointment for registration. Make certain not to have anything to eat or drink 3 hours prior to your test. If you need to reschedule for any reason, please contact radiology at [phone] to do so. __________________________________________________________________ A barium swallow is an examination that concentrates on views of the esophagus. This tends to be a double contrast exam (barium and two liquids which, when combined, create a gas to distend the wall of the oesophagus) or single contrast (non-ionic iodine based). The study is usually tailored to your symptoms so a good history is essential. Attention is paid during the study to the form, structure and configuration of the esophagus, looking for functional disorders (such as aspiration, dysphagia, achalasia, motility and reflux) EXAMINATION You may be asked to change into a gown, depending on the type of swallow being performed. A radiologist and radiographer will perform the procedure. The radiologist will advise you of the type of contrast selected for your procedure and direct you during the exam. You will be asked to stand, sit or lie in several different positions and to hold a small amount of fluid in your mouth before being asked to swallow while the imaging is performed .In some instances you may be asked to swallow barium coated  marshmallows to assess the motility of a solid food bolus. The exam can be recorded as a digital or video fluoroscopy procedure. POST PROCEDURE It will take 1-2 days for the barium to pass through your system. To facilitate this, it is important, unless otherwise directed, to increase your fluids for the next 24-48hrs and to resume your normal diet.  This test typically takes about 30 minutes to perform. __________________________________________________________________________________   Jason Russell have been scheduled for an endoscopy. Please follow written instructions given to you at your visit today. If you use inhalers (even only as needed), please bring them with you on the day of your procedure. Your physician has requested that you go to www.[email or web address].com and enter the access code given to you at your visit today. This web site gives a general overview about your procedure. However, you should still follow specific instructions given to you by our office regarding your preparation for the procedure.  It was a pleasure to see you today!  Dr. Loletha Carrow

## 2018-05-15 NOTE — Telephone Encounter (Signed)
[facility] Pre-operative Risk Assessment     Request for surgical clearance:     Endoscopy Procedure  What type of surgery is being performed?     EGD  When is this surgery scheduled?     06-14-2018  What type of clearance is required ?   Pharmacy  Are there any medications that need to be held prior to surgery and how long? 2 days  Practice name and name of physician performing surgery?      [facility]  What is your office phone and fax number?      Phone- [phone]  [phone]  Anesthesia type (None, local, MAC, general) ?       MAC

## 2018-05-16 NOTE — Telephone Encounter (Signed)
Pt has been notified and aware. He states clear understanding on how to hold his Xarelto.

## 2018-05-16 NOTE — Telephone Encounter (Signed)
Called the office of [facility] to confirm if fax was received spoke with Carla Drape, CMA that handles she asked that the clearance be re-routed to their office via Epic and not faxed  Via Epic for it only creates a fax not send what is needed. Will re-route and follow up

## 2018-05-16 NOTE — Telephone Encounter (Signed)
   Primary Cardiologist: Dorris Carnes, MD  Chart reviewed as part of pre-operative protocol coverage. Given past medical history and time since last visit, based on ACC/AHA guidelines, Jason Russell would be at acceptable risk for the planned procedure without further cardiovascular testing. Pharmacy has requested that he stop the Xarelto for 2 days prior to the procedure and start again ASAP after the procedure.   I will route this recommendation to the requesting party via Epic fax function and remove from pre-op pool.  Please call with questions.  Phill Myron. [place] Pugh, ANP, AACC  05/16/2018, 9:23 AM

## 2018-05-16 NOTE — Telephone Encounter (Signed)
Patient with diagnosis of afib on Xarelto for anticoagulation.    Procedure: EGD Date of procedure: 06/14/18  CHADS2-VASc score of  3 (CHF, HTN, AGE, DM2, stroke/tia x 2, CAD, AGE, male)  Per office protocol, patient can hold Xarelto for 2 days prior to procedure.

## 2018-05-20 ENCOUNTER — Ambulatory Visit (INDEPENDENT_AMBULATORY_CARE_PROVIDER_SITE_OTHER): Payer: Medicare Other | Admitting: Internal Medicine

## 2018-05-20 ENCOUNTER — Encounter: Payer: Self-pay | Admitting: Internal Medicine

## 2018-05-20 VITALS — BP 130/82 | HR 81 | Ht 70.75 in | Wt 287.4 lb

## 2018-05-20 DIAGNOSIS — I428 Other cardiomyopathies: Secondary | ICD-10-CM | POA: Diagnosis not present

## 2018-05-20 DIAGNOSIS — Z95810 Presence of automatic (implantable) cardiac defibrillator: Secondary | ICD-10-CM | POA: Diagnosis not present

## 2018-05-20 DIAGNOSIS — I5022 Chronic systolic (congestive) heart failure: Secondary | ICD-10-CM

## 2018-05-20 DIAGNOSIS — I480 Paroxysmal atrial fibrillation: Secondary | ICD-10-CM

## 2018-05-20 LAB — TSH: TSH: 2.37 u[IU]/mL (ref 0.450–4.500)

## 2018-05-20 NOTE — Progress Notes (Signed)
Patient Care Team: Lucianne Lei, MD as PCP - General (Family Medicine) Fay Records, MD as PCP - Cardiology (Cardiology)   HPI  Jason Russell is a 68 y.o. male Seen in follow-up for an ICD implanted 2014 for primary prevention in the context of nonischemic heart disease. He also has a history of paroxysmal atrial fibrillation  11/19 he underwent urgent device generator replacement because of early battery alert.  (WC)   Intercurrently been diagnosed with lung Ca and finished his chemo and XRT   also underwent immunotherapy 1 year.  Oncology note reads "no concerning findings for disease recurrence...  "     Date Cr K TSH LFTs PFTs  2/18    2.0      6/18  2.0    18    8/19 1.67 3.4 2.622 13   1/20 1.83 3.8      DATE TEST EF   7/13 Echo   25 %   1/14 Echo   35 %   4/14 LHC  No obs CAD  10/18 Echo  35-40 LAE (38/1.4/36)    The patient denies chest pain,  nocturnal dyspnea, orthopnea; mild edema and dyspnea climbing stairs  a.  There have been no palpitations, lightheadedness or syncope.    Diet is somewhat replete of intrinsic sodium; no added sodium  Past Medical History:  Diagnosis Date  . Adenocarcinoma of right lung, stage 3 ([place]) 08/23/2016  . Anemia   . Arthritis    "hx right hip"  . Asthma    "when I was a child"  . Atrial fibrillation (HCC)    Amiodarone started 10/2011; Coumadin  . 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 St. Jude ICD implantation 10/03/12.  . Chronic anticoagulation   . Chronic fatigue 10/18/2016  . Chronic systolic heart failure ([place])    a. Echo 7/13: EF 25%;  b. echo 04/2012:  Mild LVH, EF 30-35%, Gr 1 DD, mild AI, mild MR, mild LAE  . COPD (chronic obstructive pulmonary disease) ([place])   . Dyslipidemia   . Gout   . History of blood transfusion 10/15/2013   "don't know where the blood's going; HgB down to 5"  . Hyperlipidemia   . Hypertension   . Hypothyroidism   . NICM (nonischemic  cardiomyopathy) ([place])    [place] 4/14:  minimal CAD  . Obesity   . OSA on CPAP   . Tobacco abuse     Past Surgical History:  Procedure Laterality Date  . CARDIAC DEFIBRILLATOR PLACEMENT  2014  . CARDIOVERSION  2011  . COLONOSCOPY WITH PROPOFOL Left 10/17/2013   Procedure: COLONOSCOPY WITH PROPOFOL;  Surgeon: Inda Castle, MD;  Location: [facility];  Service: Endoscopy;  Laterality: Left;  . ESOPHAGOGASTRODUODENOSCOPY N/A 10/17/2013   Procedure: ESOPHAGOGASTRODUODENOSCOPY (EGD);  Surgeon: Inda Castle, MD;  Location: [facility];  Service: Endoscopy;  Laterality: N/A;  . GIVENS CAPSULE STUDY N/A 10/29/2013   Procedure: GIVENS CAPSULE STUDY;  Surgeon: Inda Castle, MD;  Location: [facility];  Service: Endoscopy;  Laterality: N/A;  . ICD GENERATOR CHANGEOUT N/A 02/13/2018   Procedure: ICD GENERATOR CHANGEOUT;  Surgeon: Constance Haw, MD;  Location: [facility];  Service: Cardiovascular;  Laterality: N/A;  . IMPLANTABLE CARDIOVERTER DEFIBRILLATOR IMPLANT Left 10/03/2012   Procedure: IMPLANTABLE CARDIOVERTER DEFIBRILLATOR IMPLANT;  Surgeon: Deboraha Sprang, MD;  Location: [facility] CATH LAB;  Service: Cardiovascular;  Laterality: Left;  . IR FLUORO GUIDE PORT INSERTION RIGHT  09/21/2016  . IR US GUIDE VASC ACCESS RIGHT  09/21/2016  . JOINT REPLACEMENT    . TONSILLECTOMY  1950's  . TOTAL HIP ARTHROPLASTY Right 11/25/1997  . VIDEO BRONCHOSCOPY WITH ENDOBRONCHIAL ULTRASOUND N/A 08/09/2016   Procedure: VIDEO BRONCHOSCOPY WITH ENDOBRONCHIAL ULTRASOUND;  Surgeon: Javier Glazier, MD;  Location: [facility] OR;  Service: Thoracic;  Laterality: N/A;    Current Outpatient Medications  Medication Sig Dispense Refill  . acetaminophen (TYLENOL) 500 MG tablet Take 1,000 mg by mouth every 6 (six) hours as needed for mild pain.    [name] albuterol (PROVENTIL HFA;VENTOLIN HFA) 108 (90 Base) MCG/ACT inhaler INHALE 2 PUFFS INTO THE LUNGS EVERY 4 (FOUR) HOURS AS NEEDED FOR WHEEZING OR SHORTNESS OF BREATH 8.5  Inhaler 2  . amiodarone (PACERONE) 100 MG tablet Take 1 tablet (100 mg total) by mouth daily. 90 tablet 3  . atorvastatin (LIPITOR) 20 MG tablet TAKE 1 TABLET BY MOUTH EVERY DAY 90 tablet 2  . carvedilol (COREG) 12.5 MG tablet Take 1 tablet (12.5 mg total) by mouth 2 (two) times daily. 180 tablet 3  . CVS D3 2000 units CAPS Take 2,000 Units by mouth daily.   11  . feeding supplement, ENSURE ENLIVE, (ENSURE ENLIVE) LIQD Take 237 mLs by mouth 2 (two) times daily between meals. 237 mL 12  . ferrous gluconate (FERGON) 324 MG tablet Take 1 tablet (324 mg total) by mouth 3 (three) times daily with meals. 90 tablet 7  . Fluticasone-Umeclidin-Vilant (TRELEGY ELLIPTA) 100-62.5-25 MCG/INH AEPB Inhale 1 puff into the lungs daily. 3 each 3  . furosemide (LASIX) 40 MG tablet Take 1 tablet (40 mg total) by mouth daily. 30 tablet 6  . hydrALAZINE (APRESOLINE) 100 MG tablet TAKE 1 TABLET BY MOUTH 3 (THREE) TIMES DAILY. 270 tablet 3  . levothyroxine (SYNTHROID, LEVOTHROID) 50 MCG tablet TAKE 1 TABLET BY MOUTH DAILY BEFORE BREAKFAST 90 tablet 3  . lidocaine-prilocaine (EMLA) cream APPLY GENEROUS AMOUNT TO PORT SITE AT LEAST 1 HR PRIOR TO TREATMENT. DO NOT RUB IN. 30 g 1  . lisinopril (PRINIVIL,ZESTRIL) 40 MG tablet Take 1 tablet (40 mg total) by mouth daily. 90 tablet 2  . magnesium oxide (MAG-OX) 400 (241.3 Mg) MG tablet Take 1 tablet (400 mg total) by mouth daily. 30 tablet 0  . Polyvinyl Alcohol-Povidone (REFRESH OP) Place 1 drop into both eyes every morning.    . Potassium Chloride ER 20 MEQ TBCR TAKE 1 TABLET BY MOUTH EVERY DAY 90 tablet 2  . prochlorperazine (COMPAZINE) 10 MG tablet Take 1 tablet (10 mg total) by mouth every 6 (six) hours as needed for nausea or vomiting. 30 tablet 0  . rivaroxaban (XARELTO) 20 MG TABS tablet Take 1 tablet (20 mg total) by mouth daily. 90 tablet 2  . sildenafil (REVATIO) 20 MG tablet Take as directed. Take 2-5 tablets by mouth as needed 30 tablet 1  . spironolactone  (ALDACTONE) 25 MG tablet Take 1 tablet (25 mg total) by mouth daily. 30 tablet 6  . ULORIC 40 MG tablet Take 1 tablet (40 mg total) by mouth daily. 30 tablet 0   No current facility-administered medications for this visit.     No Known Allergies    Review of Systems negative except from HPI and PMH  Physical Exam BP 130/82   Pulse 81   Ht 5' 10.75" (1.797 m)   Wt 287 lb 6.4 oz (130.4 kg)   SpO2 96%   BMI 40.37 kg/m  Well developed and Morbidly obese  in no acute distress HENT normal Neck supple with JVP-flat Clear Device pocket well healed; without hematoma or erythema.  There is no tethering  Regular rate and rhythm, no  gallop No murmur Abd-soft with active BS No Clubbing cyanosis edema Skin-warm and dry A & Oriented  Grossly normal sensory and motor function   ECG  Sinus @ 81 18/17/44 RBBB/LAD  New since 11/19  Assessment and  Plan  HTN  ICD   St Jude   RV pacing threshold chronically low  CHF systolic chronic  Morbidly obese  NICM  RBBB/LAD new   Afib-paroxysmal  Renal Insufficiency gd 3   OSA  CPAP  Lung Ca Stage 3 " CT 1/20 no evidence of disease recurrence "   BP well controlled    Mildly volume overloaded.  I encouraged him to decrease sodium intake.  Can use additional Lasix if necessary.  Reviewed with him the report from Dr. Earlie Server in the radiology regarding disease recurrence  New bifascicular block.  We will undertake an echocardiogram.  The issue of Delene Loll has been broached by Dr. Ysidro Evert.  Apparently it was much more costly.  An echocardiogram may inform the value of pursuing that drug again.  Also looking for explanation for the new bifascicular block.  No ventricular pacing so we will not consider device revision  We spent more than 50% of our >25 min visit in face to face counseling regarding the above

## 2018-05-20 NOTE — Patient Instructions (Signed)
Medication Instructions:  Your physician recommends that you continue on your current medications as directed. Please refer to the Current Medication list given to you today.  Labwork: You will have labs drawn today: TSH  Testing/Procedures: Your physician has requested that you have an echocardiogram. Echocardiography is a painless test that uses sound waves to create images of your heart. It provides your doctor with information about the size and shape of your heart and how well your heart's chambers and valves are working. This procedure takes approximately one hour. There are no restrictions for this procedure.   Follow-Up: Your physician recommends that you schedule a follow-up appointment in:   April with Dr Harrington Challenger (according to your recall)  August with Dr Caryl Comes  Any Other Special Instructions Will Be Listed Below (If Applicable).     If you need a refill on your cardiac medications before your next appointment, please call your pharmacy.

## 2018-05-24 ENCOUNTER — Ambulatory Visit (HOSPITAL_BASED_OUTPATIENT_CLINIC_OR_DEPARTMENT_OTHER): Payer: Medicare Other

## 2018-05-24 ENCOUNTER — Ambulatory Visit (HOSPITAL_COMMUNITY)
Admission: RE | Admit: 2018-05-24 | Discharge: 2018-05-24 | Disposition: A | Discharge status: Home / Self Care | Payer: Medicare Other | Source: Ambulatory Visit | Attending: Gastroenterology | Admitting: Gastroenterology

## 2018-05-24 DIAGNOSIS — Z95810 Presence of automatic (implantable) cardiac defibrillator: Secondary | ICD-10-CM | POA: Insufficient documentation

## 2018-05-24 DIAGNOSIS — R1319 Other dysphagia: Secondary | ICD-10-CM

## 2018-05-24 DIAGNOSIS — I428 Other cardiomyopathies: Secondary | ICD-10-CM

## 2018-05-24 DIAGNOSIS — R1310 Dysphagia, unspecified: Secondary | ICD-10-CM | POA: Insufficient documentation

## 2018-05-24 DIAGNOSIS — R933 Abnormal findings on diagnostic imaging of other parts of digestive tract: Secondary | ICD-10-CM | POA: Diagnosis not present

## 2018-05-24 DIAGNOSIS — I480 Paroxysmal atrial fibrillation: Secondary | ICD-10-CM

## 2018-05-24 DIAGNOSIS — Z7901 Long term (current) use of anticoagulants: Secondary | ICD-10-CM

## 2018-05-24 DIAGNOSIS — I5022 Chronic systolic (congestive) heart failure: Secondary | ICD-10-CM

## 2018-05-24 LAB — CUP PACEART INCLINIC DEVICE CHECK
Brady Statistic RV Percent Paced: 0 %
Date Time Interrogation Session: 20200217135921
HighPow Impedance: 38.9027
Implantable Lead Implant Date: 20140703
Implantable Lead Location: 753860
Implantable Pulse Generator Implant Date: 20191113
Lead Channel Impedance Value: 400 Ohm
Lead Channel Pacing Threshold Amplitude: 0.5 V
Lead Channel Pacing Threshold Amplitude: 0.5 V
Lead Channel Pacing Threshold Pulse Width: 0.5 ms
Lead Channel Pacing Threshold Pulse Width: 0.5 ms
Lead Channel Sensing Intrinsic Amplitude: 4.6 mV
Lead Channel Setting Pacing Amplitude: 2.5 V
Lead Channel Setting Pacing Pulse Width: 0.5 ms
Lead Channel Setting Sensing Sensitivity: 0.5 mV
MDC IDC MSMT BATTERY REMAINING LONGEVITY: 100 mo
Pulse Gen Serial Number: 9820959

## 2018-06-04 ENCOUNTER — Telehealth: Payer: Self-pay | Admitting: Oncology

## 2018-06-04 NOTE — Telephone Encounter (Signed)
Jason Russell contacted to obtain verbal, telephone consent to share their name and contact information with [place] Scientist, product/process development and team) for purposes of soliciting patient experience feedback.  Verbal consent obtained and documented on "[place] / [place] INFORMATION" form.  Jason Russell is aware that [place] will be in contact with them at a future date for screening purposes for interviews.  Please direct questions related to this process to Jason Russell via email at [email or web address] or extension [phone].

## 2018-06-10 ENCOUNTER — Encounter (HOSPITAL_COMMUNITY): Payer: Self-pay | Admitting: *Deleted

## 2018-06-10 ENCOUNTER — Other Ambulatory Visit: Payer: Self-pay

## 2018-06-13 ENCOUNTER — Encounter: Payer: Medicare Other | Admitting: *Deleted

## 2018-06-14 ENCOUNTER — Telehealth: Payer: Self-pay

## 2018-06-14 ENCOUNTER — Ambulatory Visit (HOSPITAL_COMMUNITY): Payer: Medicare Other | Admitting: Anesthesiology

## 2018-06-14 ENCOUNTER — Other Ambulatory Visit: Payer: Self-pay

## 2018-06-14 ENCOUNTER — Encounter (HOSPITAL_COMMUNITY)
Admission: RE | Disposition: A | Discharge status: Home / Self Care | Payer: Self-pay | Source: Home / Self Care | Attending: Gastroenterology

## 2018-06-14 ENCOUNTER — Encounter (HOSPITAL_COMMUNITY): Payer: Self-pay | Admitting: *Deleted

## 2018-06-14 ENCOUNTER — Ambulatory Visit (HOSPITAL_COMMUNITY)
Admission: RE | Admit: 2018-06-14 | Discharge: 2018-06-14 | Disposition: A | Discharge status: Home / Self Care | Payer: Medicare Other | Attending: Gastroenterology | Admitting: Gastroenterology

## 2018-06-14 ENCOUNTER — Ambulatory Visit (HOSPITAL_COMMUNITY): Payer: Medicare Other

## 2018-06-14 DIAGNOSIS — R1310 Dysphagia, unspecified: Secondary | ICD-10-CM

## 2018-06-14 DIAGNOSIS — Z6836 Body mass index (BMI) 36.0-36.9, adult: Secondary | ICD-10-CM | POA: Diagnosis not present

## 2018-06-14 DIAGNOSIS — Z87891 Personal history of nicotine dependence: Secondary | ICD-10-CM | POA: Insufficient documentation

## 2018-06-14 DIAGNOSIS — R1314 Dysphagia, pharyngoesophageal phase: Secondary | ICD-10-CM | POA: Insufficient documentation

## 2018-06-14 DIAGNOSIS — I110 Hypertensive heart disease with heart failure: Secondary | ICD-10-CM | POA: Diagnosis not present

## 2018-06-14 DIAGNOSIS — Y842 Radiological procedure and radiotherapy as the cause of abnormal reaction of the patient, or of later complication, without mention of misadventure at the time of the procedure: Secondary | ICD-10-CM | POA: Insufficient documentation

## 2018-06-14 DIAGNOSIS — Z7951 Long term (current) use of inhaled steroids: Secondary | ICD-10-CM | POA: Insufficient documentation

## 2018-06-14 DIAGNOSIS — J449 Chronic obstructive pulmonary disease, unspecified: Secondary | ICD-10-CM | POA: Diagnosis not present

## 2018-06-14 DIAGNOSIS — E039 Hypothyroidism, unspecified: Secondary | ICD-10-CM | POA: Insufficient documentation

## 2018-06-14 DIAGNOSIS — Z7901 Long term (current) use of anticoagulants: Secondary | ICD-10-CM | POA: Diagnosis not present

## 2018-06-14 DIAGNOSIS — E785 Hyperlipidemia, unspecified: Secondary | ICD-10-CM | POA: Insufficient documentation

## 2018-06-14 DIAGNOSIS — I428 Other cardiomyopathies: Secondary | ICD-10-CM

## 2018-06-14 DIAGNOSIS — I5022 Chronic systolic (congestive) heart failure: Secondary | ICD-10-CM | POA: Insufficient documentation

## 2018-06-14 DIAGNOSIS — G4733 Obstructive sleep apnea (adult) (pediatric): Secondary | ICD-10-CM | POA: Insufficient documentation

## 2018-06-14 DIAGNOSIS — M109 Gout, unspecified: Secondary | ICD-10-CM | POA: Diagnosis not present

## 2018-06-14 DIAGNOSIS — Z95810 Presence of automatic (implantable) cardiac defibrillator: Secondary | ICD-10-CM | POA: Insufficient documentation

## 2018-06-14 DIAGNOSIS — M1611 Unilateral primary osteoarthritis, right hip: Secondary | ICD-10-CM | POA: Insufficient documentation

## 2018-06-14 DIAGNOSIS — I4891 Unspecified atrial fibrillation: Secondary | ICD-10-CM | POA: Diagnosis not present

## 2018-06-14 DIAGNOSIS — K222 Esophageal obstruction: Secondary | ICD-10-CM | POA: Diagnosis not present

## 2018-06-14 DIAGNOSIS — Z79899 Other long term (current) drug therapy: Secondary | ICD-10-CM | POA: Insufficient documentation

## 2018-06-14 DIAGNOSIS — Z85118 Personal history of other malignant neoplasm of bronchus and lung: Secondary | ICD-10-CM | POA: Insufficient documentation

## 2018-06-14 DIAGNOSIS — R1319 Other dysphagia: Secondary | ICD-10-CM

## 2018-06-14 DIAGNOSIS — R933 Abnormal findings on diagnostic imaging of other parts of digestive tract: Secondary | ICD-10-CM

## 2018-06-14 HISTORY — PX: ESOPHAGOGASTRODUODENOSCOPY (EGD) WITH PROPOFOL: SHX5813

## 2018-06-14 HISTORY — PX: SAVORY DILATION: SHX5439

## 2018-06-14 SURGERY — ESOPHAGOGASTRODUODENOSCOPY (EGD) WITH PROPOFOL
Anesthesia: Monitor Anesthesia Care

## 2018-06-14 MED ORDER — PROPOFOL 10 MG/ML IV BOLUS
INTRAVENOUS | Status: AC
  Filled 2018-06-14: qty 40

## 2018-06-14 MED ORDER — LACTATED RINGERS IV SOLN
INTRAVENOUS | Status: DC
  Administered 2018-06-14: 08:00:00 via INTRAVENOUS

## 2018-06-14 MED ORDER — PROPOFOL 500 MG/50ML IV EMUL
INTRAVENOUS | Status: DC | PRN
  Administered 2018-06-14: 10 mg via INTRAVENOUS
  Administered 2018-06-14: 35 mg via INTRAVENOUS

## 2018-06-14 MED ORDER — PROPOFOL 500 MG/50ML IV EMUL
INTRAVENOUS | Status: DC | PRN
  Administered 2018-06-14: 75 ug/kg/min via INTRAVENOUS

## 2018-06-14 MED ORDER — SODIUM CHLORIDE 0.9 % IV SOLN: INTRAVENOUS | Status: DC

## 2018-06-14 SURGICAL SUPPLY — 14 items

## 2018-06-14 NOTE — Op Note (Signed)
[facility] Patient Name: Jason Russell Procedure Date: 06/14/2018 MRN: [id number] Attending MD: Estill Cotta. Loletha Carrow , MD Date of Birth: 1951-02-19 CSN: [id number] Age: 69 Admit Type: Outpatient Procedure:                Upper GI endoscopy Indications:              Esophageal dysphagia, For therapy of esophageal                            stricture (prior XTR for lung cancer) Providers:                Estill Cotta. Loletha Carrow, MD, Jeanella Cara, RN,                            Cherylynn Ridges, Technician, Rosario Adie, CRNA Referring MD:             Curt Bears, MD Medicines:                Monitored Anesthesia Care Complications:            No immediate complications. Estimated blood loss:                            Minimal. Estimated Blood Loss:      Procedure:                Pre-Anesthesia Assessment:                           - Prior to the procedure, a History and Physical                            was performed, and patient medications and                            allergies were reviewed. The patient's tolerance of                            previous anesthesia was also reviewed. The risks                            and benefits of the procedure and the sedation                            options and risks were discussed with the patient.                            All questions were answered, and informed consent                            was obtained. Prior Anticoagulants: The patient has                            taken Xarelto (rivaroxaban), last dose was 2 days  prior to procedure. ASA Grade Assessment: III - A                            patient with severe systemic disease. After                            reviewing the risks and benefits, the patient was                            deemed in satisfactory condition to undergo the                            procedure.                           After obtaining informed  consent, the endoscope was                            passed under direct vision. Throughout the                            procedure, the patient's blood pressure, pulse, and                            oxygen saturations were monitored continuously. The                            GIF-H190 ([id number]) Olympus gastroscope was                            introduced through the mouth, and advanced to the                            second part of duodenum. The upper GI endoscopy was                            somewhat difficult due to stricture. The patient                            tolerated the procedure, but required significant                            airway management by anesthesia - suctioning and                            chin lift (thick neck, redundant soft tissue). Scope In: Scope Out: Findings:      One benign-appearing, intrinsic moderate stenosis was found 23 to 30 cm       from the incisors. This stenosis measured 9 mm (inner diameter) x 7 cm       (in length). The stenosis was traversed. A guidewire was placed under       fluoroscopic guidance and the scope was withdrawn. Dilation was       performed with a Savary dilator with mild resistance at 12 mm  and       moderate resistance at 14 mm. The dilation site was examined and showed       moderate mucosal disruption and moderate improvement in luminal       narrowing.      The stomach was normal.      The cardia and gastric fundus were normal on retroflexion.      The examined duodenum was normal. Impression:               - Benign-appearing esophageal stenosis. Dilated.                           - Normal stomach.                           - Normal examined duodenum.                           - No specimens collected.                           Very little motility was seen in the esophagus.                            This patient's dysphagia has both mechanical and                            motility causes (from  radiation) Moderate Sedation:      Not Applicable - Patient had care per Anesthesia. Recommendation:           - Patient has a contact number available for                            emergencies. The signs and symptoms of potential                            delayed complications were discussed with the                            patient. Return to normal activities tomorrow.                            Written discharge instructions were provided to the                            patient.                           - Clear liquid diet for 2 hours. Then soft diet for                            2 days. Then regular diet can be resumed. Meat                            should always be ground, vegetables should be  cooked,and all food cut and chewed well with a sip                            of liquid after every bite.                           - Resume Xarelto (rivaroxaban) at prior dose in 2                            days. Procedure Code(s):        --- Professional ---                           [phone], Esophagogastroduodenoscopy, flexible,                            transoral; with insertion of guide wire followed by                            passage of dilator(s) through esophagus over guide                            wire                           74081, Intraluminal dilation of strictures and/or                            obstructions (eg, esophagus), radiological                            supervision and interpretation Diagnosis Code(s):        --- Professional ---                           K22.2, Esophageal obstruction                           R13.14, Dysphagia, pharyngoesophageal phase CPT copyright 2018 American Medical Association. All rights reserved. The codes documented in this report are preliminary and upon coder review may  be revised to meet current compliance requirements. [name], MD 06/14/2018 9:46:10 AM This report has been signed  electronically. Number of Addenda: 0

## 2018-06-14 NOTE — H&P (Signed)
History:  This patient presents for endoscopic testing for dysphagia - esophageal stricture.  Jason Russell Referring physician: Lucianne Lei, MD  Past Medical History: Past Medical History:  Diagnosis Date  . Adenocarcinoma of right lung, stage 3 ([place]) 08/23/2016  . Anemia   . Arthritis    "hx right hip"  . Asthma    "when I was a child"  . Atrial fibrillation (HCC)    Amiodarone started 10/2011; Coumadin  . 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 St. Jude ICD implantation 10/03/12.  . Chronic anticoagulation   . Chronic fatigue 10/18/2016  . Chronic systolic heart failure ([place])    a. Echo 7/13: EF 25%;  b. echo 04/2012:  Mild LVH, EF 30-35%, Gr 1 DD, mild AI, mild MR, mild LAE  . COPD (chronic obstructive pulmonary disease) ([place])   . Dyslipidemia   . Gout   . History of blood transfusion 10/15/2013   "don't know where the blood's going; HgB down to 5"  . Hyperlipidemia   . Hypertension   . Hypothyroidism   . NICM (nonischemic cardiomyopathy) ([place])    [place] 4/14:  minimal CAD  . Obesity   . OSA on CPAP   . Tobacco abuse      Past Surgical History: Past Surgical History:  Procedure Laterality Date  . CARDIAC DEFIBRILLATOR PLACEMENT  2014  . CARDIOVERSION  2011  . COLONOSCOPY WITH PROPOFOL Left 10/17/2013   Procedure: COLONOSCOPY WITH PROPOFOL;  Surgeon: Inda Castle, MD;  Location: [facility];  Service: Endoscopy;  Laterality: Left;  . ESOPHAGOGASTRODUODENOSCOPY N/A 10/17/2013   Procedure: ESOPHAGOGASTRODUODENOSCOPY (EGD);  Surgeon: Inda Castle, MD;  Location: [facility];  Service: Endoscopy;  Laterality: N/A;  . GIVENS CAPSULE STUDY N/A 10/29/2013   Procedure: GIVENS CAPSULE STUDY;  Surgeon: Inda Castle, MD;  [facility];  Service: Endoscopy;  Laterality: N/A;  . ICD GENERATOR CHANGEOUT N/A 02/13/2018   Procedure: ICD GENERATOR CHANGEOUT;  Surgeon: Constance Haw, MD;  Location: [facility] CV LAB;  Service:  Cardiovascular;  Laterality: N/A;  . IMPLANTABLE CARDIOVERTER DEFIBRILLATOR IMPLANT Left 10/03/2012   Procedure: IMPLANTABLE CARDIOVERTER DEFIBRILLATOR IMPLANT;  Surgeon: Deboraha Sprang, MD;  Location: [facility] CATH LAB;  Service: Cardiovascular;  Laterality: Left;  . IR FLUORO GUIDE PORT INSERTION RIGHT  09/21/2016  . IR US GUIDE VASC ACCESS RIGHT  09/21/2016  . JOINT REPLACEMENT    . TONSILLECTOMY  1950's  . TOTAL HIP ARTHROPLASTY Right 11/25/1997  . VIDEO BRONCHOSCOPY WITH ENDOBRONCHIAL ULTRASOUND N/A 08/09/2016   Procedure: VIDEO BRONCHOSCOPY WITH ENDOBRONCHIAL ULTRASOUND;  Surgeon: Javier Glazier, MD;  Location: [facility] OR;  Service: Thoracic;  Laterality: N/A;    Allergies: No Known Allergies  Outpatient Meds: Current Facility-Administered Medications  Medication Dose Route Frequency Provider Last Rate Last Dose  . lactated ringers infusion   Intravenous Continuous Nelida Meuse III, MD 20 mL/hr at 06/14/18 0800        ___________________________________________________________________ Objective   Exam:  BP (!) 150/74   Pulse (!) 50   Temp 98.6 F (37 C) (Oral)   Resp 20   Ht 6\' 1"  (1.854 m)   Wt 126.1 kg   SpO2 99%   BMI 36.68 kg/m    CV: RRR without murmur, S1/S2, no JVD, no peripheral edema  Resp: clear to auscultation bilaterally, normal RR and effort noted  GI: soft, no tenderness, with active bowel sounds. No guarding or palpable organomegaly noted.  Neuro: awake, alert and oriented x  3. 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  Plan:  EGD with wire-guided dilation Patient has been off [place] x 2 days   Nelida Meuse III

## 2018-06-14 NOTE — Transfer of Care (Signed)
Immediate Anesthesia Transfer of Care Note  Patient: Jason Russell  Procedure(s) Performed: ESOPHAGOGASTRODUODENOSCOPY (EGD) WITH PROPOFOL (N/A ) SAVORY DILATION (N/A )  Patient Location: PACU  Anesthesia Type:MAC  Level of Consciousness: awake, alert  and oriented  Airway & Oxygen Therapy: Patient Spontanous Breathing and Patient connected to nasal cannula oxygen  Post-op Assessment: Report given to RN and Post -op Vital signs reviewed and stable  Post vital signs: Reviewed and stable  Last Vitals:  Vitals Value Taken Time  BP 137/78 06/14/2018  9:40 AM  Temp    Pulse 62 06/14/2018  9:40 AM  Resp 19 06/14/2018  9:40 AM  SpO2 100 % 06/14/2018  9:40 AM  Vitals shown include unvalidated device data.  Last Pain:  Vitals:   06/14/18 0755  TempSrc: Oral  PainSc: 0-No pain         Complications: No apparent anesthesia complications

## 2018-06-14 NOTE — Telephone Encounter (Signed)
Left message for patient to remind of missed remote transmission.  

## 2018-06-14 NOTE — Anesthesia Procedure Notes (Signed)
Date/Time: 06/14/2018 8:56 AM Performed by: Glory Buff, CRNA Oxygen Delivery Method: Nasal cannula

## 2018-06-14 NOTE — Anesthesia Preprocedure Evaluation (Addendum)
Anesthesia Evaluation  Patient identified by MRN, date of birth, ID band Patient awake    Reviewed: Allergy & Precautions, NPO status , Patient's Chart, lab work & pertinent test results, reviewed documented beta blocker date and time   History of Anesthesia Complications Negative for: history of anesthetic complications  Airway Mallampati: II  TM Distance: <3 FB Neck ROM: Limited    Dental  (+) Dental Advisory Given, Caps   Pulmonary sleep apnea and Continuous Positive Airway Pressure Ventilation , COPD, former smoker,    breath sounds clear to auscultation       Cardiovascular hypertension, Pt. on medications and Pt. on home beta blockers + dysrhythmias Atrial Fibrillation + Cardiac Defibrillator  Rhythm:Regular Rate:Normal  ECHO 2/20 IMPRESSIONS  1. The left ventricle has moderately reduced systolic function, with an ejection fraction of 35-40%. The cavity size was normal. There is mildly increased left ventricular wall thickness. Left ventricular diastolic Doppler parameters are consistent with  impaired relaxation.  2. The right ventricle has mildly reduced systolic function. The cavity was small. There is no increase in right ventricular wall thickness.   Neuro/Psych negative neurological ROS     GI/Hepatic   Endo/Other  Hypothyroidism Morbid obesity  Renal/GU Renal InsufficiencyRenal disease     Musculoskeletal  (+) Arthritis ,   Abdominal (+) + obese,   Peds  Hematology  (+) Blood dyscrasia (xarelto, last dose monday 4/30), anemia ,   Anesthesia Other Findings   Reproductive/Obstetrics                            Anesthesia Physical  Anesthesia Plan  ASA: III  Anesthesia Plan: MAC   Post-op Pain Management:    Induction: Intravenous  PONV Risk Score and Plan: 1 and Treatment may vary due to age or medical condition  Airway Management Planned: Nasal Cannula and Simple Face  Mask  Additional Equipment:   Intra-op Plan:   Post-operative Plan:   Informed Consent: I have reviewed the patients History and Physical, chart, labs and discussed the procedure including the risks, benefits and alternatives for the proposed anesthesia with the patient or authorized representative who has indicated his/her understanding and acceptance.     Dental advisory given  Plan Discussed with: CRNA, Surgeon and Anesthesiologist  Anesthesia Plan Comments:       Anesthesia Quick Evaluation

## 2018-06-14 NOTE — Interval H&P Note (Signed)
History and Physical Interval Note:  06/14/2018 8:51 AM  Jason Russell  has presented today for surgery, with the diagnosis of Dysphagia.  The various methods of treatment have been discussed with the patient and family. After consideration of risks, benefits and other options for treatment, the patient has consented to  Procedure(s): ESOPHAGOGASTRODUODENOSCOPY (EGD) WITH PROPOFOL (N/A) as a surgical intervention.  The patient's history has been reviewed, patient examined, no change in status, stable for surgery.  I have reviewed the patient's chart and labs.  Questions were answered to the patient's satisfaction.     Nelida Meuse III

## 2018-06-14 NOTE — Discharge Instructions (Signed)
See endoscopy report given to you for dietary instructions.  Resume taking your Xarelto on 06/16/18    Upper Endoscopy, Adult Upper endoscopy is a procedure to look inside the upper GI (gastrointestinal) tract. The upper GI tract is made up of:  The part of the body that moves food from your mouth to your stomach (esophagus).  The stomach.  The first part of your small intestine (duodenum). This procedure is also called esophagogastroduodenoscopy (EGD) or gastroscopy. In this procedure, your health care provider passes a thin, flexible tube (endoscope) through your mouth and down your esophagus into your stomach. A small camera is attached to the end of the tube. Images from the camera appear on a monitor in the exam room. During this procedure, your health care provider may also remove a small piece of tissue to be sent to a lab and examined under a microscope (biopsy). Your health care provider may do an upper endoscopy to diagnose cancers of the upper GI tract. You may also have this procedure to find the cause of other conditions, such as:  Stomach pain.  Heartburn.  Pain or problems when swallowing.  Nausea and vomiting.  Stomach bleeding.  Stomach ulcers. Tell a health care provider about:  Any allergies you have.  All medicines you are taking, including vitamins, herbs, eye drops, creams, and over-the-counter medicines.  Any problems you or family members have had with anesthetic medicines.  Any blood disorders you have.  Any surgeries you have had.  Any medical conditions you have.  Whether you are pregnant or may be pregnant. What are the risks? Generally, this is a safe procedure. However, problems may occur, including:  Infection.  Bleeding.  Allergic reactions to medicines.  A tear or hole (perforation) in the esophagus, stomach, or duodenum. What happens before the procedure? Staying hydrated Follow instructions from your health care provider about  hydration, which may include:  Up to 2 hours before the procedure - you may continue to drink clear liquids, such as water, clear fruit juice, black coffee, and plain tea.  Eating and drinking restrictions Follow instructions from your health care provider about eating and drinking, which may include:  8 hours before the procedure - stop eating heavy meals or foods, such as meat, fried foods, or fatty foods.  6 hours before the procedure - stop eating light meals or foods, such as toast or cereal.  6 hours before the procedure - stop drinking milk or drinks that contain milk.  2 hours before the procedure - stop drinking clear liquids. Medicines Ask your health care provider about:  Changing or stopping your regular medicines. This is especially important if you are taking diabetes medicines or blood thinners.  Taking medicines such as aspirin and ibuprofen. These medicines can thin your blood. Do not take these medicines unless your health care provider tells you to take them.  Taking over-the-counter medicines, vitamins, herbs, and supplements. General instructions  Plan to have someone take you home from the hospital or clinic.  If you will be going home right after the procedure, plan to have someone with you for 24 hours.  Ask your health care provider what steps will be taken to help prevent infection. What happens during the procedure?   An IV will be inserted into one of your veins.  You may be given one or more of the following: ? A medicine to help you relax (sedative). ? A medicine to numb the throat (local anesthetic).  You will  lie on your left side on an exam table.  Your health care provider will pass the endoscope through your mouth and down your esophagus.  Your health care provider will use the scope to check the inside of your esophagus, stomach, and duodenum. Biopsies may be taken.  The endoscope will be removed. The procedure may vary among health  care providers and hospitals. What happens after the procedure?  Your blood pressure, heart rate, breathing rate, and blood oxygen level will be monitored until you leave the hospital or clinic.  Do not drive for 24 hours if you were given a sedative during your procedure.  When your throat is no longer numb, you may be given some fluids to drink.  It is up to you to get the results of your procedure. Ask your health care provider, or the department that is doing the procedure, when your results will be ready. Summary  Upper endoscopy is a procedure to look inside the upper GI tract.  During the procedure, an IV will be inserted into one of your veins. You may be given a medicine to help you relax.  A medicine will be used to numb your throat.  The endoscope will be passed through your mouth and down your esophagus. This information is not intended to replace advice given to you by your health care provider. Make sure you discuss any questions you have with your health care provider. Document Released: 03/17/2000 Document Revised: 08/20/2017 Document Reviewed: 08/20/2017 Elsevier Interactive Patient Education  2019 [employer].

## 2018-06-14 NOTE — Anesthesia Postprocedure Evaluation (Signed)
Anesthesia Post Note  Patient: Jason Russell  Procedure(s) Performed: ESOPHAGOGASTRODUODENOSCOPY (EGD) WITH PROPOFOL (N/A ) SAVORY DILATION (N/A )     Patient location during evaluation: PACU Anesthesia Type: MAC Level of consciousness: awake and alert Pain management: pain level controlled Vital Signs Assessment: post-procedure vital signs reviewed and stable Respiratory status: spontaneous breathing, nonlabored ventilation, respiratory function stable and patient connected to nasal cannula oxygen Cardiovascular status: stable and blood pressure returned to baseline Postop Assessment: no apparent nausea or vomiting Anesthetic complications: no    Last Vitals:  Vitals:   06/14/18 0950 06/14/18 1000  BP: (!) 149/82 (!) 151/72  Pulse: 60 (!) 58  Resp: 17 (!) 21  Temp:    SpO2: 99% 98%    Last Pain:  Vitals:   06/14/18 1000  TempSrc:   PainSc: 0-No pain                 Terrisa Curfman

## 2018-06-16 IMAGING — DX DG SHOULDER 2+V*R*
3 series · 3 of 3 positions shown · non-contrast
Comparison: None.

CLINICAL DATA: Right shoulder pain

EXAM:
RIGHT SHOULDER - 2+ VIEW

[grashey]
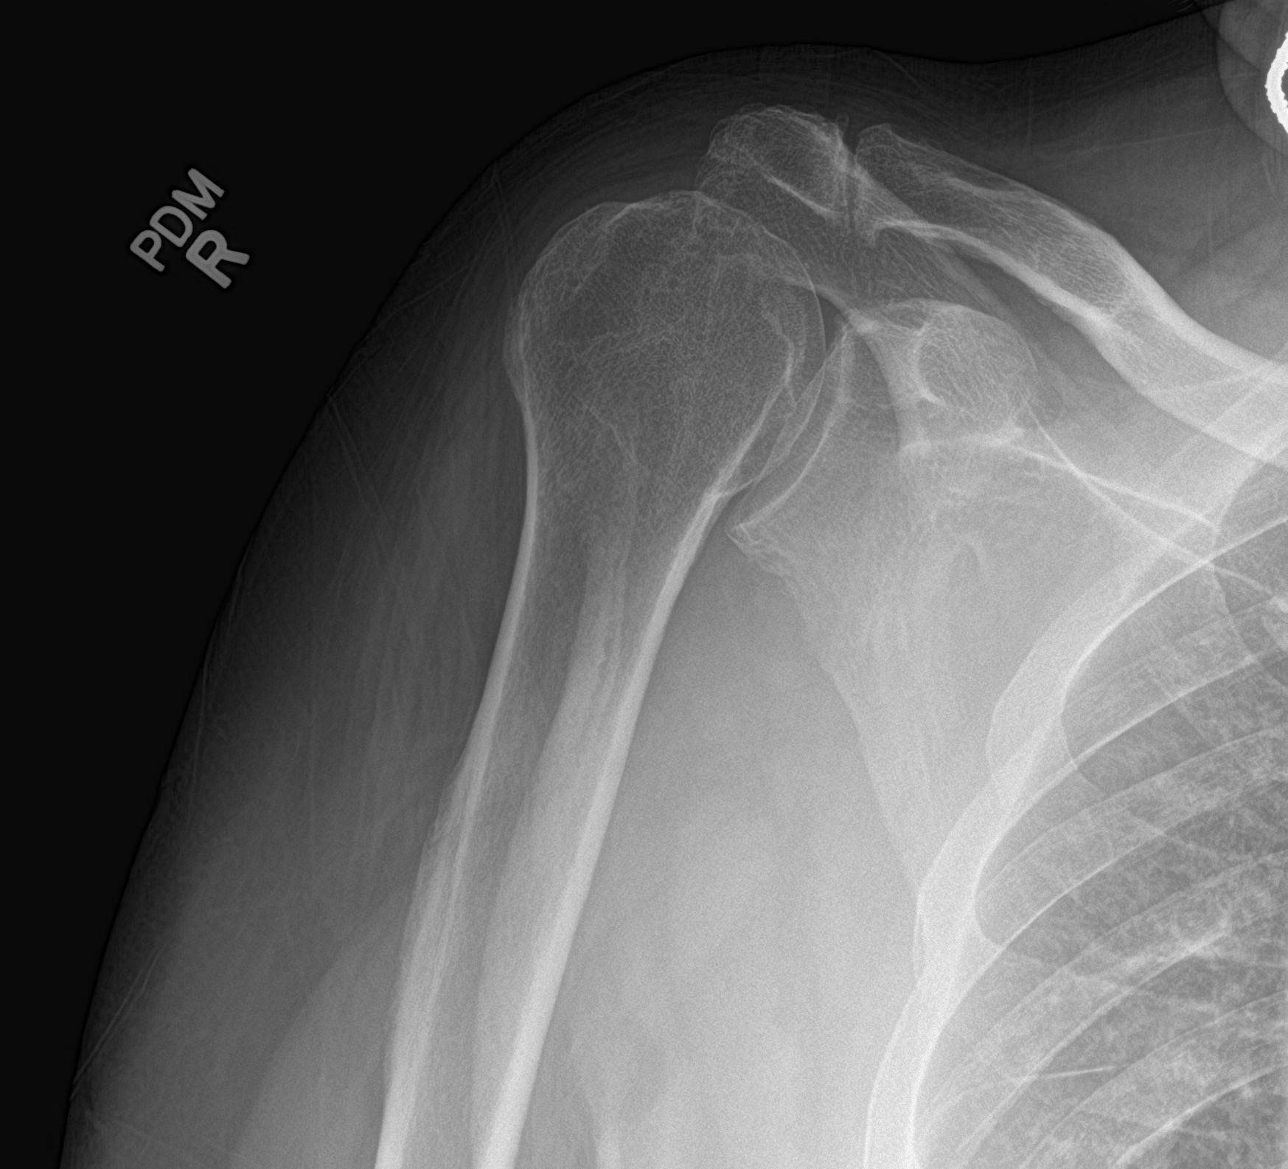

[y view]
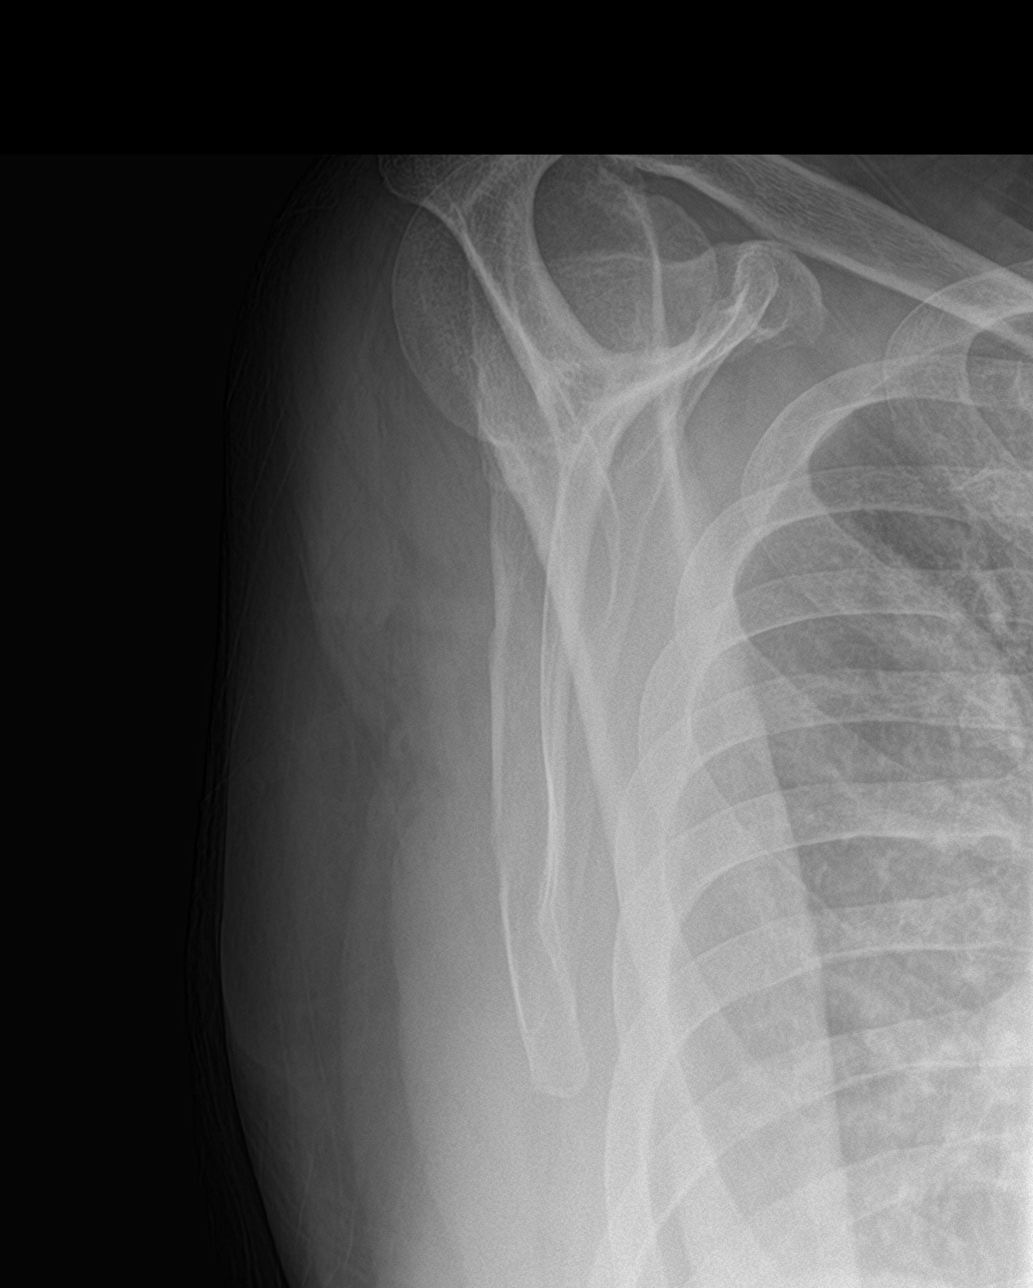

[shoulder axial]
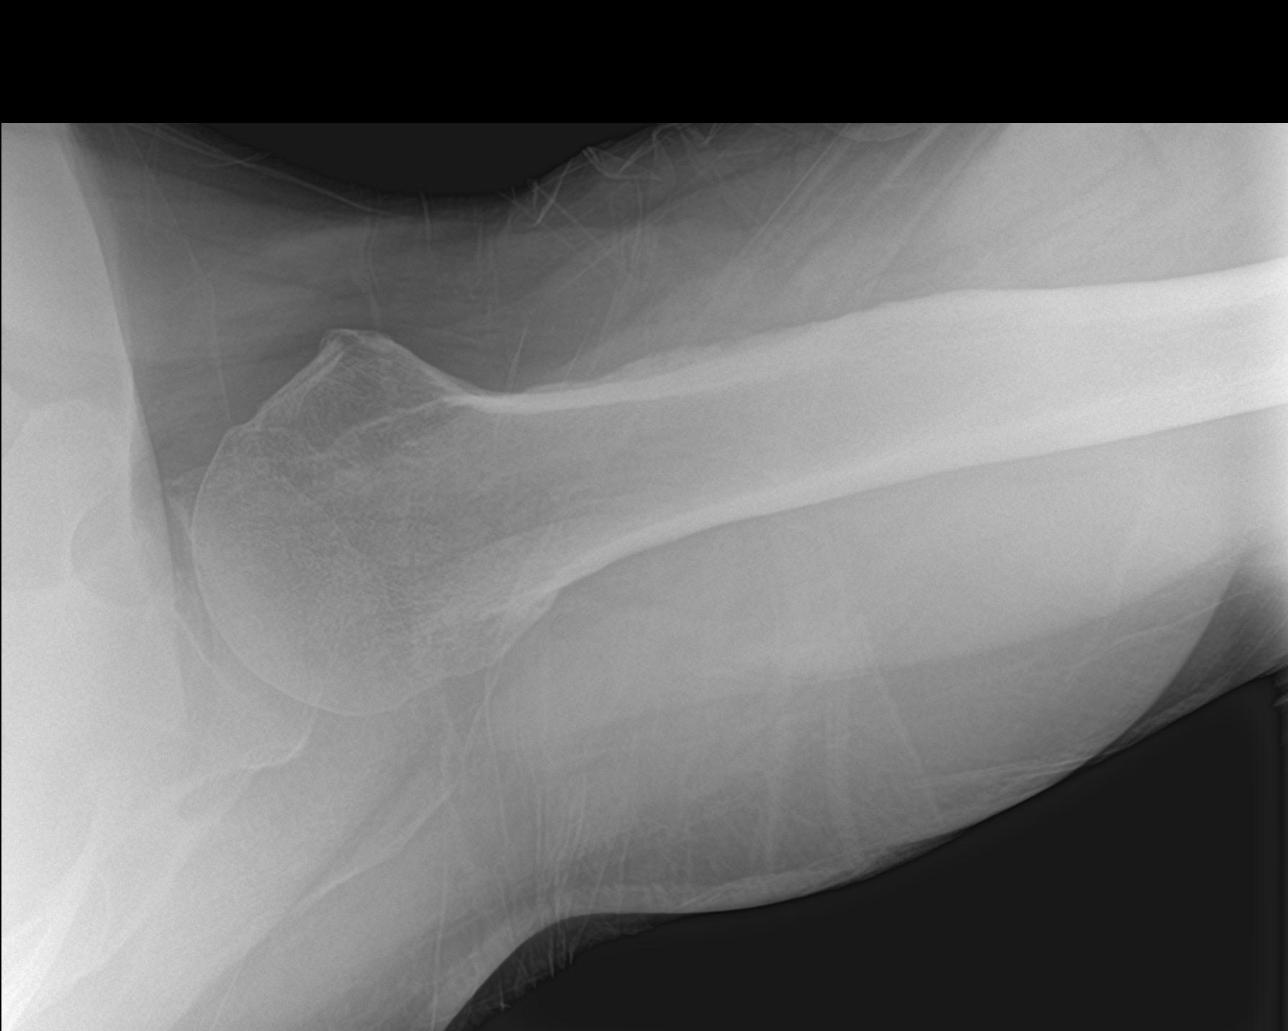

[3 of 3 positions shown; findings below may reference images not displayed]

FINDINGS: There are moderate degenerative changes involving the
acromioclavicular joint. Mild glenohumeral joint osteoarthritis
noted. No fractures or subluxations.
IMPRESSION: 1. Degenerative changes.
2. No acute findings.

## 2018-06-17 ENCOUNTER — Encounter (HOSPITAL_COMMUNITY): Payer: Self-pay | Admitting: Gastroenterology

## 2018-06-18 ENCOUNTER — Other Ambulatory Visit: Payer: Self-pay | Admitting: Adult Health

## 2018-06-20 ENCOUNTER — Encounter: Payer: Self-pay | Admitting: Cardiology

## 2018-06-20 ENCOUNTER — Ambulatory Visit (INDEPENDENT_AMBULATORY_CARE_PROVIDER_SITE_OTHER): Payer: Medicare Other | Admitting: *Deleted

## 2018-06-20 ENCOUNTER — Telehealth: Payer: Self-pay | Admitting: Internal Medicine

## 2018-06-20 DIAGNOSIS — I428 Other cardiomyopathies: Secondary | ICD-10-CM

## 2018-06-20 DIAGNOSIS — I5022 Chronic systolic (congestive) heart failure: Secondary | ICD-10-CM

## 2018-06-20 NOTE — Telephone Encounter (Signed)
Spoke w/ pt and instructed him how to send manual transmission. Transmission received.

## 2018-06-20 NOTE — Telephone Encounter (Signed)
  Patient called because he received a MyChart message that we did not receive his transmission

## 2018-06-21 ENCOUNTER — Other Ambulatory Visit: Payer: Self-pay

## 2018-06-21 LAB — CUP PACEART REMOTE DEVICE CHECK
Battery Remaining Longevity: 98 mo
Battery Remaining Percentage: 92 %
Battery Voltage: 3.19 V
Brady Statistic RV Percent Paced: 1 %
Date Time Interrogation Session: 20200319202355
HighPow Impedance: 38 Ohm
HighPow Impedance: 38 Ohm
Implantable Lead Implant Date: 20140703
Implantable Lead Location: 753860
Implantable Pulse Generator Implant Date: 20191113
Lead Channel Impedance Value: 400 Ohm
Lead Channel Pacing Threshold Amplitude: 0.5 V
Lead Channel Pacing Threshold Pulse Width: 0.5 ms
Lead Channel Sensing Intrinsic Amplitude: 3.6 mV
Lead Channel Setting Pacing Amplitude: 2.5 V
Lead Channel Setting Pacing Pulse Width: 0.5 ms
MDC IDC SET LEADCHNL RV SENSING SENSITIVITY: 0.5 mV
Pulse Gen Serial Number: 9820959

## 2018-06-24 ENCOUNTER — Other Ambulatory Visit: Payer: Self-pay

## 2018-06-24 ENCOUNTER — Ambulatory Visit (INDEPENDENT_AMBULATORY_CARE_PROVIDER_SITE_OTHER): Payer: Medicare Other

## 2018-06-24 DIAGNOSIS — Z95810 Presence of automatic (implantable) cardiac defibrillator: Secondary | ICD-10-CM

## 2018-06-24 DIAGNOSIS — I5022 Chronic systolic (congestive) heart failure: Secondary | ICD-10-CM

## 2018-06-26 NOTE — Progress Notes (Signed)
EPIC Encounter for ICM Monitoring  Patient Name: Jason Russell is a 68 y.o. male Date: 06/26/2018 Primary Care Physican: Lucianne Lei, MD  Primary Cardiologist:Ross Electrophysiologist: Caryl Comes Last Weight:284lbs 06/26/2018 Weight:284 lbs   Heart Failure questions reviewed.  Pt asymptomatic.  Report: Thoracic impedance normal.  Prescribed:Furosemide40 mg1tablet (40 mg total)daily. Potassium 20 mEq 1 tablet daily.  Labs: 04/19/2018 Creatinine 1.83, BUN 28, Potassium 3.8, Sodium 143, eGFR 38-44 02/27/2018 Creatinine 1.81, BUN 25, Potassium 3.6, Sodium 141, eGFR 38-44 02/13/2018 Creatinine 1.81, BUN 20, Potassium 3.7, Sodium 140, eGFR 37-43 01/09/2018 Creatinine1.95, BUN30, Potassium3.5, Sodium144, [id number] A complete set of results can be found in results review  Recommendations: Encouraged to call for fluid symptoms.  Follow-up plan: ICM clinic phone appointment on4/27/2020. Office appointment with Dr Harrington Challenger 07/15/2018.  Copy of ICM check sent to [facility].   3 month ICM trend: 06/20/2018    1 Year ICM trend:       [name], RN 06/26/2018 11:55 AM

## 2018-06-27 NOTE — Progress Notes (Signed)
Remote ICD transmission.   

## 2018-07-03 ENCOUNTER — Telehealth: Payer: Self-pay

## 2018-07-04 ENCOUNTER — Other Ambulatory Visit: Payer: Self-pay | Admitting: Adult Health

## 2018-07-04 NOTE — Telephone Encounter (Signed)
Pt needs to have appt as it has been over a year since he was last seen.  Attempted to call pt but unable to reach. Left message for pt to return call.

## 2018-07-04 NOTE — Telephone Encounter (Signed)
Called pt to discuss possible E-visits with Dr. Harrington Challenger in place of [facility]. Pt gave me verbal consent and was agreeable to phone visit. I informed pt that a nurse would be calling to schedule an appropriate date and time.      Virtual Visit Pre-Appointment Phone Call  Steps For Call:  1. Confirm consent - "In the setting of the current Covid19 crisis, you are scheduled for a (phone or video) visit with your provider on (date) at (time).  Just as we do with many in-office visits, in order for you to participate in this visit, we must obtain consent.  If you'd like, I can send this to your mychart (if signed up) or email for you to review.  Otherwise, I can obtain your verbal consent now.  All virtual visits are billed to your insurance company just like a normal visit would be.  By agreeing to a virtual visit, we'd like you to understand that the technology does not allow for your provider to perform an examination, and thus may limit your provider's ability to fully assess your condition.  Finally, though the technology is pretty good, we cannot assure that it will always work on either your or our end, and in the setting of a video visit, we may have to convert it to a phone-only visit.  In either situation, we cannot ensure that we have a secure connection.  Are you willing to proceed?"  2. Give patient instructions for WebEx download to smartphone as below if video visit  3. Advise patient to be prepared with any vital sign or heart rhythm information, their current medicines, and a piece of paper and pen handy for any instructions they may receive the day of their visit  4. Inform patient they will receive a phone call 15 minutes prior to their appointment time (may be from unknown caller ID) so they should be prepared to answer  5. Confirm that appointment type is correct in Epic appointment notes (video vs telephone)    TELEPHONE CALL NOTE  Jason Russell has been deemed a candidate for a  follow-up tele-health visit to limit community exposure during the Covid-19 pandemic. I spoke with the patient via phone to ensure availability of phone/video source, confirm preferred email & phone number, and discuss instructions and expectations.  I reminded Jason Russell to be prepared with any vital sign and/or heart rhythm information that could potentially be obtained via home monitoring, at the time of his visit. I reminded Jason Russell to expect a phone call at the time of his visit if his visit.  Did the patient verbally acknowledge consent to treatment? Cataract, [place] 07/04/2018 11:04 AM   DOWNLOADING THE [place] TO SMARTPHONE  - If Apple, go to [employer] and type in WebEx in the search bar. [place] [employer], the blue/green circle. The app is free but as with any other app downloads, their phone may require them to verify saved payment information or Apple password. The patient does NOT have to create an account.  - If Android, ask patient to go to [employer] and type in WebEx in the search bar. [place] [employer], the blue/green circle. The app is free but as with any other app downloads, their phone may require them to verify saved payment information or Android password. The patient does NOT have to create an account.   CONSENT FOR TELE-HEALTH VISIT - PLEASE REVIEW  I hereby voluntarily request,  consent and authorize [facility] and its employed or contracted physicians, physician assistants, nurse practitioners or other licensed health care professionals (the Practitioner), to provide me with telemedicine health care services (the "Services") as deemed necessary by the treating Practitioner. I acknowledge and consent to receive the Services by the Practitioner via telemedicine. I understand that the telemedicine visit will involve communicating with the Practitioner through live audiovisual communication technology and the  disclosure of certain medical information by electronic transmission. I acknowledge that I have been given the opportunity to request an in-person assessment or other available alternative prior to the telemedicine visit and am voluntarily participating in the telemedicine visit.  I understand that I have the right to withhold or withdraw my consent to the use of telemedicine in the course of my care at any time, without affecting my right to future care or treatment, and that the Practitioner or I may terminate the telemedicine visit at any time. I understand that I have the right to inspect all information obtained and/or recorded in the course of the telemedicine visit and may receive copies of available information for a reasonable fee.  I understand that some of the potential risks of receiving the Services via telemedicine include:  [name] Delay or interruption in medical evaluation due to technological equipment failure or disruption; . Information transmitted may not be sufficient (e.g. poor resolution of images) to allow for appropriate medical decision making by the Practitioner; and/or  . In rare instances, security protocols could fail, causing a breach of personal health information.  Furthermore, I acknowledge that it is my responsibility to provide information about my medical history, conditions and care that is complete and accurate to the best of my ability. I acknowledge that Practitioner's advice, recommendations, and/or decision may be based on factors not within their control, such as incomplete or inaccurate data provided by me or distortions of diagnostic images or specimens that may result from electronic transmissions. I understand that the practice of medicine is not an exact science and that Practitioner makes no warranties or guarantees regarding treatment outcomes. I acknowledge that I will receive a copy of this consent concurrently upon execution via email to the email address I  last provided but may also request a printed copy by calling the office of [facility].    I understand that my insurance will be billed for this visit.   I have read or had this consent read to me. . I understand the contents of this consent, which adequately explains the benefits and risks of the Services being provided via telemedicine.  . I have been provided ample opportunity to ask questions regarding this consent and the Services and have had my questions answered to my satisfaction. . I give my informed consent for the services to be provided through the use of telemedicine in my medical care  By participating in this telemedicine visit I agree to the above.

## 2018-07-05 ENCOUNTER — Telehealth: Payer: Self-pay | Admitting: Adult Health

## 2018-07-05 NOTE — Telephone Encounter (Signed)
Called and spoke with pt to try to figure out the reason for him calling Janeth Rase. Per pt, she had talked with him about doing a televisit and pt said if he was due for an appt, he would do a televisit. I stated that a recall has been put in for an appt with RB since our MD's will not be back in office until at least July but I also stated to pt since he was last seen 05/28/17, he was overdue for an appt and said that we should schedule him for a televisit. Pt expressed understanding. televisit has been scheduled for pt Monday, 4/6 with TP. Nothing further needed.

## 2018-07-06 ENCOUNTER — Other Ambulatory Visit: Payer: Self-pay | Admitting: Adult Health

## 2018-07-08 ENCOUNTER — Other Ambulatory Visit: Payer: Self-pay

## 2018-07-08 ENCOUNTER — Encounter: Payer: Self-pay | Admitting: Adult Health

## 2018-07-08 ENCOUNTER — Ambulatory Visit (INDEPENDENT_AMBULATORY_CARE_PROVIDER_SITE_OTHER): Payer: Medicare Other | Admitting: Adult Health

## 2018-07-08 DIAGNOSIS — G4733 Obstructive sleep apnea (adult) (pediatric): Secondary | ICD-10-CM | POA: Diagnosis not present

## 2018-07-08 DIAGNOSIS — J449 Chronic obstructive pulmonary disease, unspecified: Secondary | ICD-10-CM | POA: Diagnosis not present

## 2018-07-08 DIAGNOSIS — C3490 Malignant neoplasm of unspecified part of unspecified bronchus or lung: Secondary | ICD-10-CM

## 2018-07-08 NOTE — Patient Instructions (Signed)
Continue on TRELEGY daily , rinse after use Activity as tolerated.  Follow up with Oncology as planned   Continue on CPAP at bedtime Do not drive a sleepy Work on [employer] .  Order for new CPAP done .    Follow up with Dr. Lamonte Sakai  In 6 months and As needed

## 2018-07-08 NOTE — Progress Notes (Signed)
Virtual Visit via Telephone Note  I connected with Jason Russell on 07/08/18 at  3:00 PM EDT by telephone and verified that I am speaking with the correct person using two identifiers.   I discussed the limitations, risks, security and privacy concerns of performing an evaluation and management service by telephone and the availability of in person appointments. I also discussed with the patient that there may be a patient responsible charge related to this service. The patient expressed understanding and agreed to proceed.   History of Present Illness: Today tele-visit is for 1 year follow-up for COPD and sleep apnea 68 year old male followed for severe COPD, severe sleep apnea and non-small cell lung cancer Medical history significant for nonischemic heart disease, A. fib, status post ICD, on amiodarone  Patient has known severe COPD.  He remains on Trelegy inhaler daily.  He denies any flare cough or wheezing.  He gets winded with heavy activity. Feels breathing is doing well. No flare of cough or wheezing . Still goes fishing . Taking care of wife who is ill.  On amiodarone and Lisinopril   Patient has severe sleep apnea on CPAP at bedtime.  Patient says he wears his CPAP every night.  He never misses a night.  Gets in about 8 to 9 hours.  Download shows excellent compliance with average usage at 9 hours.  AHI 1.2.  Minimum leaks.  Daily pressure at 12 cm H2O.  Patient says he feels rested with no significant daytime sleepiness.  Feels that he benefits from CPAP.  Patient is on auto CPAP 5 to 20 cm H2O. Would like a new CPAP machine , his is getting old. No longer does auto shut off, hard to turn off at times.   Patient has stage IIIa non-small cell lung cancer, adenocarcinoma with a right upper lobe nodule in addition to mediastinal lymphadenopathy diagnosed in May 2018.  He follows with medical oncology and radiation oncology.  He was treated with XRT in July 2018.  Completed carboplatin  and paclitaxel in 2018.  Immunotherapy Imfinzi began in September 2018. Has finished this course.   Most recent CT chest January 2020 showed posttreatment changes in the right hemothorax without evidence of recurrent or metastatic disease. Currently on observation.     Observations/Objective: 07/31/16: FVC 1.84 L (41%) FEV1 1.14 L (42%) FEV1/FVC 0.77 FEF 25-75 1.17 L (38%) positive bronchodilator response TLC 5.76 L (75%) RV 140% ERV 48% DLCO uncorrected 52%   IMAGING PET CT 07/31/16 Markedly hypermetabolic posterior right upper lobe pulmonary lesion consistent with malignancy and hypermetabolic mediastinal lymph node metastases.  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 This obscures the regional right upper lobe apical lung nodule. No obvious nodular growth or recurrent thoracic adenopathy.  PATHOLOGY FNA RUL 09/06/16: Adenocarcinoma  EBUS 4R &7 08/09/16: Adenocarcinoma   Vaccines : PVX , Influenza are utd.   Assessment and Plan: COPD - stable on Trelegy   OSA -excellent control on CPAP   Lung Cancer hx - appears to be stable  Cont follow up with Oncology   Plan  Patient Instructions  Continue on TRELEGY daily , rinse after use Activity as tolerated.  Follow up with Oncology as planned   Continue on CPAP at bedtime Do not drive a sleepy Work on [employer] .  Order for new CPAP done .    Follow up with Dr. Lamonte Sakai  In 6 months and As needed       Follow Up Instructions:  Follow up  with Dr. Lamonte Sakai  In 6 months and As needed    I discussed the assessment and treatment plan with the patient. The patient was provided an opportunity to ask questions and all were answered. The patient agreed with the plan and demonstrated an understanding of the instructions.   The patient was advised to call back or seek an in-person evaluation if the symptoms worsen or if the condition fails to improve as anticipated.  I provided 21 minutes of  non-face-to-face time during this encounter.   Rexene Edison, NP

## 2018-07-08 NOTE — Addendum Note (Signed)
Addended by: Valerie Salts on: 07/08/2018 03:55 PM   Modules accepted: Orders

## 2018-07-09 ENCOUNTER — Telehealth: Payer: Self-pay | Admitting: Adult Health

## 2018-07-09 ENCOUNTER — Telehealth: Payer: Self-pay

## 2018-07-09 MED ORDER — RIVAROXABAN 20 MG PO TABS: 20.0000 mg | ORAL_TABLET | Freq: Every day | ORAL | 1 refills | Status: DC

## 2018-07-09 NOTE — Telephone Encounter (Signed)
Called patient and made aware rx Xarelto #30 with 1 refill will be sent. He needs to have his cardiologist refill in the future. Pt states he has an appt on 4/17. Nothing further needed.

## 2018-07-09 NOTE — Telephone Encounter (Signed)
Moved pts appt from 4/13 to 4/17 at noon  Pt agreeable to VIDEO visit.     Virtual Visit Pre-Appointment Phone Call  Steps For Call:  1. Confirm consent - "In the setting of the current Covid19 crisis, you are scheduled for a (phone or video) visit with your provider on 4/17 at 12pm.  Just as we do with many in-office visits, in order for you to participate in this visit, we must obtain consent.  If you'd like, I can send this to your mychart (if signed up) or email for you to review.  Otherwise, I can obtain your verbal consent now.  All virtual visits are billed to your insurance company just like a normal visit would be.  By agreeing to a virtual visit, we'd like you to understand that the technology does not allow for your provider to perform an examination, and thus may limit your provider's ability to fully assess your condition.  Finally, though the technology is pretty good, we cannot assure that it will always work on either your or our end, and in the setting of a video visit, we may have to convert it to a phone-only visit.  In either situation, we cannot ensure that we have a secure connection.  Are you willing to proceed?"  2. Give patient instructions for WebEx download to smartphone as below if video visit  3. Advise patient to be prepared with any vital sign or heart rhythm information, their current medicines, and a piece of paper and pen handy for any instructions they may receive the day of their visit  4. Inform patient they will receive a phone call 15 minutes prior to their appointment time (may be from unknown caller ID) so they should be prepared to answer  5. Confirm that appointment type is correct in Epic appointment notes (video vs telephone)    TELEPHONE CALL NOTE  Jason Russell has been deemed a candidate for a follow-up tele-health visit to limit community exposure during the Covid-19 pandemic. I spoke with the patient via phone to ensure availability of  phone/video source, confirm preferred email & phone number, and discuss instructions and expectations.  I reminded Jason Russell to be prepared with any vital sign and/or heart rhythm information that could potentially be obtained via home monitoring, at the time of his visit. I reminded Jason Russell to expect a phone call at the time of his visit if his visit.  Did the patient verbally acknowledge consent to treatment? YES  Wilma Flavin, RN 07/09/2018 10:22 AM  .

## 2018-07-09 NOTE — Telephone Encounter (Signed)
Tammy patient had televisit on yesterday. Is it to refill Xarelto?  History of Present Illness: Today tele-visit is for 1 year follow-up for COPD and sleep apnea 68 year old male followed for severe COPD, severe sleep apnea and non-small cell lung cancer Medical history significant for nonischemic heart disease, A. fib, status post ICD, on amiodarone  Patient has known severe COPD.  He remains on Trelegy inhaler daily.  He denies any flare cough or wheezing.  He gets winded with heavy activity. Feels breathing is doing well. No flare of cough or wheezing . Still goes fishing . Taking care of wife who is ill.  On amiodarone and Lisinopril   Patient has severe sleep apnea on CPAP at bedtime.  Patient says he wears his CPAP every night.  He never misses a night.  Gets in about 8 to 9 hours.  Download shows excellent compliance with average usage at 9 hours.  AHI 1.2.  Minimum leaks.  Daily pressure at 12 cm H2O.  Patient says he feels rested with no significant daytime sleepiness.  Feels that he benefits from CPAP.  Patient is on auto CPAP 5 to 20 cm H2O. Would like a new CPAP machine , his is getting old. No longer does auto shut off, hard to turn off at times.   Patient has stage IIIa non-small cell lung cancer, adenocarcinoma with a right upper lobe nodule in addition to mediastinal lymphadenopathy diagnosed in May 2018.  He follows with medical oncology and radiation oncology.  He was treated with XRT in July 2018.  Completed carboplatin and paclitaxel in 2018.  Immunotherapy Imfinzi began in September 2018. Has finished this course.   Most recent CT chest January 2020 showed posttreatment changes in the right hemothorax without evidence of recurrent or metastatic disease. Currently on observation.    Observations/Objective: 07/31/16: FVC 1.84 L (41%) FEV1 1.14 L (42%) FEV1/FVC 0.77 FEF 25-75 1.17 L (38%) positive bronchodilator response TLC 5.76 L (75%) RV 140% ERV 48% DLCO uncorrected  52%   IMAGING PET CT 07/31/16 Markedly hypermetabolic posterior right upper lobe pulmonary lesion consistent with malignancy and hypermetabolic mediastinal lymph node metastases.  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 This obscures the regional right upper lobe apical lung nodule. No obvious nodular growth or recurrent thoracic adenopathy.  PATHOLOGY FNA RUL 09/06/16: Adenocarcinoma  EBUS 4R &7 08/09/16: Adenocarcinoma  Vaccines : PVX , Influenza are utd.  Assessment and Plan: COPD - stable on Trelegy   OSA -excellent control on CPAP   Lung Cancer hx - appears to be stable  Cont follow up with Oncology   Plan  Patient Instructions  Continue on TRELEGY daily , rinse after use Activity as tolerated.  Follow up with Oncology as planned   Continue on CPAP at bedtime Do not drive a sleepy Work on [employer] .  Order for new CPAP done .    Follow up with Dr. Lamonte Sakai  In 6 months and As needed       Follow Up Instructions:  Follow up with Dr. Lamonte Sakai  In 6 months and As needed    I discussed the assessment and treatment plan with the patient. The patient was provided an opportunity to ask questions and all were answered. The patient agreed with the plan and demonstrated an understanding of the instructions.  The patient was advised to call back or seek an in-person evaluation if the symptoms worsen or if the condition fails to improve as anticipated.  I provided 21  minutes of non-face-to-face time during this encounter.   Rexene Edison, NP

## 2018-07-09 NOTE — Telephone Encounter (Signed)
You can refill xarelto x 1 , he needs to get further refills thru cardiology as this is on from them for  A Fib not ourselves.  Please address with him.

## 2018-07-12 ENCOUNTER — Telehealth: Payer: Self-pay | Admitting: Internal Medicine

## 2018-07-12 NOTE — Telephone Encounter (Signed)
Per reschedule list move 4/20 f/u to 4/22 as webex. Other appointments remain the same. Confirmed with patient.

## 2018-07-15 ENCOUNTER — Telehealth: Payer: Medicare Other | Admitting: Internal Medicine

## 2018-07-19 ENCOUNTER — Other Ambulatory Visit: Payer: Self-pay

## 2018-07-19 ENCOUNTER — Telehealth (INDEPENDENT_AMBULATORY_CARE_PROVIDER_SITE_OTHER): Payer: Medicare Other | Admitting: Internal Medicine

## 2018-07-19 ENCOUNTER — Encounter: Payer: Self-pay | Admitting: Internal Medicine

## 2018-07-19 ENCOUNTER — Inpatient Hospital Stay: Payer: Medicare Other | Attending: Internal Medicine

## 2018-07-19 ENCOUNTER — Ambulatory Visit (HOSPITAL_COMMUNITY)
Admission: RE | Admit: 2018-07-19 | Discharge: 2018-07-19 | Disposition: A | Discharge status: Home / Self Care | Payer: Medicare Other | Source: Ambulatory Visit | Attending: Internal Medicine | Admitting: Internal Medicine

## 2018-07-19 VITALS — Ht 73.0 in | Wt 284.0 lb

## 2018-07-19 DIAGNOSIS — I428 Other cardiomyopathies: Secondary | ICD-10-CM | POA: Diagnosis not present

## 2018-07-19 DIAGNOSIS — I4891 Unspecified atrial fibrillation: Secondary | ICD-10-CM | POA: Insufficient documentation

## 2018-07-19 DIAGNOSIS — Z9221 Personal history of antineoplastic chemotherapy: Secondary | ICD-10-CM | POA: Diagnosis not present

## 2018-07-19 DIAGNOSIS — Z85118 Personal history of other malignant neoplasm of bronchus and lung: Secondary | ICD-10-CM | POA: Diagnosis not present

## 2018-07-19 DIAGNOSIS — C3411 Malignant neoplasm of upper lobe, right bronchus or lung: Secondary | ICD-10-CM | POA: Insufficient documentation

## 2018-07-19 DIAGNOSIS — E785 Hyperlipidemia, unspecified: Secondary | ICD-10-CM | POA: Insufficient documentation

## 2018-07-19 DIAGNOSIS — Z9225 Personal history of immunosupression therapy: Secondary | ICD-10-CM | POA: Diagnosis not present

## 2018-07-19 DIAGNOSIS — Z79899 Other long term (current) drug therapy: Secondary | ICD-10-CM | POA: Insufficient documentation

## 2018-07-19 DIAGNOSIS — I110 Hypertensive heart disease with heart failure: Secondary | ICD-10-CM | POA: Insufficient documentation

## 2018-07-19 DIAGNOSIS — Z7901 Long term (current) use of anticoagulants: Secondary | ICD-10-CM | POA: Diagnosis not present

## 2018-07-19 DIAGNOSIS — Z923 Personal history of irradiation: Secondary | ICD-10-CM | POA: Diagnosis not present

## 2018-07-19 DIAGNOSIS — I5022 Chronic systolic (congestive) heart failure: Secondary | ICD-10-CM | POA: Insufficient documentation

## 2018-07-19 DIAGNOSIS — E039 Hypothyroidism, unspecified: Secondary | ICD-10-CM | POA: Insufficient documentation

## 2018-07-19 DIAGNOSIS — I480 Paroxysmal atrial fibrillation: Secondary | ICD-10-CM

## 2018-07-19 DIAGNOSIS — Z9989 Dependence on other enabling machines and devices: Secondary | ICD-10-CM | POA: Diagnosis not present

## 2018-07-19 DIAGNOSIS — G4733 Obstructive sleep apnea (adult) (pediatric): Secondary | ICD-10-CM | POA: Insufficient documentation

## 2018-07-19 DIAGNOSIS — C3490 Malignant neoplasm of unspecified part of unspecified bronchus or lung: Secondary | ICD-10-CM | POA: Insufficient documentation

## 2018-07-19 DIAGNOSIS — R634 Abnormal weight loss: Secondary | ICD-10-CM | POA: Insufficient documentation

## 2018-07-19 DIAGNOSIS — I10 Essential (primary) hypertension: Secondary | ICD-10-CM

## 2018-07-19 DIAGNOSIS — M1990 Unspecified osteoarthritis, unspecified site: Secondary | ICD-10-CM | POA: Diagnosis not present

## 2018-07-19 DIAGNOSIS — J449 Chronic obstructive pulmonary disease, unspecified: Secondary | ICD-10-CM | POA: Diagnosis not present

## 2018-07-19 LAB — CBC WITH DIFFERENTIAL (CANCER CENTER ONLY)
Abs Immature Granulocytes: 0.03 10*3/uL (ref 0.00–0.07)
Basophils Absolute: 0 10*3/uL (ref 0.0–0.1)
Basophils Relative: 1 %
Eosinophils Absolute: 0.1 10*3/uL (ref 0.0–0.5)
Eosinophils Relative: 2 %
HCT: 36.9 % — ABNORMAL LOW (ref 39.0–52.0)
Hemoglobin: 11.6 g/dL — ABNORMAL LOW (ref 13.0–17.0)
Immature Granulocytes: 1 %
Lymphocytes Relative: 13 %
Lymphs Abs: 0.7 10*3/uL (ref 0.7–4.0)
MCH: 29.4 pg (ref 26.0–34.0)
MCHC: 31.4 g/dL (ref 30.0–36.0)
MCV: 93.4 fL (ref 80.0–100.0)
Monocytes Absolute: 0.7 10*3/uL (ref 0.1–1.0)
Monocytes Relative: 13 %
Neutro Abs: 3.9 10*3/uL (ref 1.7–7.7)
Neutrophils Relative %: 70 %
Platelet Count: 187 10*3/uL (ref 150–400)
RBC: 3.95 MIL/uL — ABNORMAL LOW (ref 4.22–5.81)
RDW: 15 % (ref 11.5–15.5)
WBC Count: 5.5 10*3/uL (ref 4.0–10.5)
nRBC: 0 % (ref 0.0–0.2)

## 2018-07-19 LAB — CMP (CANCER CENTER ONLY)
ALT: 12 U/L (ref 0–44)
AST: 15 U/L (ref 15–41)
Albumin: 3.4 g/dL — ABNORMAL LOW (ref 3.5–5.0)
Alkaline Phosphatase: 121 U/L (ref 38–126)
Anion gap: 8 (ref 5–15)
BUN: 29 mg/dL — ABNORMAL HIGH (ref 8–23)
CO2: 26 mmol/L (ref 22–32)
Calcium: 9.2 mg/dL (ref 8.9–10.3)
Chloride: 108 mmol/L (ref 98–111)
Creatinine: 1.89 mg/dL — ABNORMAL HIGH (ref 0.61–1.24)
GFR, Est AFR Am: 42 mL/min — ABNORMAL LOW (ref >60)
GFR, Estimated: 36 mL/min — ABNORMAL LOW (ref >60)
Glucose, Bld: 110 mg/dL — ABNORMAL HIGH (ref 70–99)
Potassium: 4.2 mmol/L (ref 3.5–5.1)
Sodium: 142 mmol/L (ref 135–145)
Total Bilirubin: 0.3 mg/dL (ref 0.3–1.2)
Total Protein: 6.6 g/dL (ref 6.5–8.1)

## 2018-07-19 NOTE — Patient Instructions (Signed)
Medication Instructions:  No changes today If you need a refill on your cardiac medications before your next appointment, please call your pharmacy.   Lab work: None. If you have labs (blood work) drawn today and your tests are completely normal, you will receive your results only by: [name] MyChart Message (if you have MyChart) OR . A paper copy in the mail If you have any lab test that is abnormal or we need to change your treatment, we will call you to review the results.  Testing/Procedures: none  Follow-Up: At [facility], you and your health needs are our priority.  As part of our continuing mission to provide you with exceptional heart care, we have created designated Provider Care Teams.  These Care Teams include your primary Cardiologist (physician) and Advanced Practice Providers (APPs -  Physician Assistants and Nurse Practitioners) who all work together to provide you with the care you need, when you need it. You will need a follow up appointment in:  6 months.  Please call our office 2 months in advance to schedule this appointment.  You may see Dorris Carnes, MD or one of the following Advanced Practice Providers on your designated Care Team: Richardson Dopp, PA-C [place], [place] . Daune Perch, NP  Any Other Special Instructions Will Be Listed Below (If Applicable).

## 2018-07-19 NOTE — Progress Notes (Signed)
Virtual Visit via Video Note   This visit type was conducted due to national recommendations for restrictions regarding the COVID-19 Pandemic (e.g. social distancing) in an effort to limit this patient's exposure and mitigate transmission in our community.  Due to his co-morbid illnesses, this patient is at least at moderate risk for complications without adequate follow up.  This format is felt to be most appropriate for this patient at this time.  All issues noted in this document were discussed and addressed.  A limited physical exam was performed with this format.  Please refer to the patient's chart for his consent to telehealth for [facility].   Evaluation Performed:  Follow-up visit  Date:  07/19/2018   ID:  Jason Russell, DOB 1950/06/25, MRN [id number]  Patient Location: Home Provider Location: Home  PCP:  Lucianne Lei, MD  Cardiologist:  Dorris Carnes, MD  Electrophysiologist:  None   Chief Complaint:  F/U of chronic systolic CHF and atiral fibrillaiton    History of Present Illness:    Jason Russell is a 68 y.o. male with hx of HTN, OSA, PAF, chronic systolic CHF, Treated with amiodarone.   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    Past Medical History:  Diagnosis Date  . Adenocarcinoma of right lung, stage 3 ([place]) 08/23/2016  . Anemia   . Arthritis    "hx right hip"  . Asthma    "when I was a child"  . Atrial fibrillation (HCC)    Amiodarone started 10/2011; Coumadin  . Automatic implantable  cardioverter-defibrillator in situ 10/03/2012   a. St. Jude ICD implantation 10/03/12.  . Chronic anticoagulation   . Chronic fatigue 10/18/2016  . Chronic systolic heart failure ([place])    a. Echo 7/13: EF 25%;  b. echo 04/2012:  Mild LVH, EF 30-35%, Gr 1 DD, mild AI, mild MR, mild LAE  . COPD (chronic obstructive pulmonary disease) ([place])   . Dyslipidemia   . Gout   . History of blood transfusion 10/15/2013   "don't know where the blood's going; HgB down to 5"  . Hyperlipidemia   . Hypertension   . Hypothyroidism   . NICM (nonischemic cardiomyopathy) ([place])    [place] 4/14:  minimal CAD  . Obesity   . OSA on CPAP   . Tobacco abuse    Past Surgical History:  Procedure Laterality Date  . CARDIAC DEFIBRILLATOR PLACEMENT  2014  . CARDIOVERSION  2011  . COLONOSCOPY WITH PROPOFOL Left 10/17/2013   Procedure: COLONOSCOPY WITH PROPOFOL;  Surgeon: Inda Castle, MD;  Location: [facility];  Service: Endoscopy;  Laterality: Left;  . ESOPHAGOGASTRODUODENOSCOPY N/A 10/17/2013   Procedure: ESOPHAGOGASTRODUODENOSCOPY (EGD);  Surgeon: Inda Castle, MD;  Location: [facility];  Service: Endoscopy;  Laterality: N/A;  . ESOPHAGOGASTRODUODENOSCOPY (EGD) WITH PROPOFOL N/A 06/14/2018   Procedure: ESOPHAGOGASTRODUODENOSCOPY (EGD) WITH PROPOFOL;  Surgeon: Doran Stabler, MD;  Location[facility];  Service: Gastroenterology;  Laterality: N/A;  . GIVENS CAPSULE STUDY N/A 10/29/2013   Procedure: GIVENS CAPSULE STUDY;  Surgeon: Inda Castle, MD;  Location: [facility];  Service: Endoscopy;  Laterality: N/A;  . ICD GENERATOR CHANGEOUT N/A 02/13/2018   Procedure: ICD GENERATOR CHANGEOUT;  Surgeon: Constance Haw, MD;  Location: [facility];  Service: Cardiovascular;  Laterality: N/A;  . IMPLANTABLE CARDIOVERTER DEFIBRILLATOR IMPLANT Left 10/03/2012   Procedure: IMPLANTABLE CARDIOVERTER DEFIBRILLATOR IMPLANT;  Surgeon: Deboraha Sprang, MD;  Location: [facility] LAB;  Service: Cardiovascular;   Laterality: Left;  . IR FLUORO GUIDE PORT INSERTION RIGHT  09/21/2016  . IR US GUIDE VASC ACCESS RIGHT  09/21/2016  . JOINT REPLACEMENT    . SAVORY DILATION N/A 06/14/2018   Procedure: SAVORY DILATION;  Surgeon: Doran Stabler, MD;  Location: [facility];  Service: Gastroenterology;  Laterality: N/A;  . TONSILLECTOMY  1950's  . TOTAL HIP ARTHROPLASTY Right 11/25/1997  . VIDEO BRONCHOSCOPY WITH ENDOBRONCHIAL ULTRASOUND N/A 08/09/2016   Procedure: VIDEO BRONCHOSCOPY WITH ENDOBRONCHIAL ULTRASOUND;  Surgeon: Javier Glazier, MD;  Location: [facility];  Service: Thoracic;  Laterality: N/A;     Current Meds  Medication Sig  . acetaminophen (TYLENOL) 500 MG tablet Take 1,000 mg by mouth every 6 (six) hours as needed for mild pain.  [name] albuterol (PROVENTIL HFA;VENTOLIN HFA) 108 (90 Base) MCG/ACT inhaler INHALE 2 PUFFS INTO THE LUNGS EVERY 4 (FOUR) HOURS AS NEEDED FOR WHEEZING OR SHORTNESS OF BREATH  . amiodarone (PACERONE) 100 MG tablet Take 1 tablet (100 mg total) by mouth daily.  [name] atorvastatin (LIPITOR) 20 MG tablet TAKE 1 TABLET BY MOUTH EVERY DAY  . carvedilol (COREG) 12.5 MG tablet Take 1 tablet (12.5 mg total) by mouth 2 (two) times daily.  . [employer] D3 2000 units CAPS Take 2,000 Units by mouth daily.   . feeding supplement, ENSURE ENLIVE, (ENSURE ENLIVE) LIQD Take 237 mLs by mouth 2 (two) times daily between meals.  . ferrous gluconate (FERGON) 324 MG tablet Take 1 tablet (324 mg total) by mouth 3 (three) times daily with meals.  . Fluticasone-Umeclidin-Vilant (TRELEGY ELLIPTA) 100-62.5-25 MCG/INH AEPB Inhale 1 puff into the lungs daily.  . furosemide (LASIX) 40 MG tablet Take 1 tablet (40 mg total) by mouth daily.  . hydrALAZINE (APRESOLINE) 100 MG tablet TAKE 1 TABLET BY MOUTH 3 (THREE) TIMES DAILY.  [name] levothyroxine (SYNTHROID, LEVOTHROID) 50 MCG tablet TAKE 1 TABLET BY MOUTH DAILY BEFORE BREAKFAST (Patient taking differently: Take 50 mcg by mouth daily before breakfast. )  . lidocaine-prilocaine  (EMLA) cream APPLY GENEROUS AMOUNT TO PORT SITE AT LEAST 1 HR PRIOR TO TREATMENT. DO NOT RUB IN. (Patient taking differently: Apply 1 application topically See admin instructions. Apply generous amount to port site at least 1 hr prior to treatment.  DO NOT RUB IN.)  . lisinopril (PRINIVIL,ZESTRIL) 40 MG tablet Take 1 tablet (40 mg total) by mouth daily.  . magnesium oxide (MAG-OX) 400 (241.3 Mg) MG tablet Take 1 tablet (400 mg total) by mouth daily.  . Polyvinyl Alcohol-Povidone (REFRESH OP) Place 1 drop into both eyes every morning.  . Potassium Chloride ER 20 MEQ TBCR TAKE 1 TABLET BY MOUTH EVERY DAY  . rivaroxaban (XARELTO) 20 MG TABS tablet Take 1 tablet (20 mg total) by mouth daily.  . sildenafil (REVATIO) 20 MG tablet Take as directed. Take 2-5 tablets by mouth as needed (Patient taking differently: Take 20-100 mg by mouth See admin instructions. Take as  directed. Take 2-5 tablets by mouth as needed)  . spironolactone (ALDACTONE) 25 MG tablet Take 1 tablet (25 mg total) by mouth daily.  [name] ULORIC 40 MG tablet Take 1 tablet (40 mg total) by mouth daily.     Allergies:   Patient has no known allergies.   Social History   Tobacco Use  . Smoking status: Former Smoker    Packs/day: 0.25    Years: 45.00    Pack years: 11.25    Types: Cigarettes    Last attempt to quit: 07/11/2016    Years since quitting: 2.0  . Smokeless tobacco: Never Used  Substance Use Topics  . Alcohol use: Yes    Alcohol/week: 1.0 standard drinks    Types: 1 Cans of beer per week  . Drug use: No     Family Hx: The patient's family history includes Heart Problems in his mother; Hypertension in his mother; Lung cancer in his brother; Other in his father.  ROS:   Please see the history of present illness.    All other systems reviewed and are negative.   Prior CV studies:   The following studies were reviewed today:   Labs/Other Tests and Data Reviewed:    EKG:  EKG is not done   Recent Labs:  02/13/2018: Magnesium 2.0 05/20/2018: TSH 2.370 07/19/2018: ALT 12; BUN 29; Creatinine 1.89; Hemoglobin 11.6; Platelet Count 187; Potassium 4.2; Sodium 142   Recent Lipid Panel Lab Results  Component Value Date/Time   CHOL 150 12/23/2012 12:05 PM   TRIG 73.0 12/23/2012 12:05 PM   HDL 46.60 12/23/2012 12:05 PM   CHOLHDL 3 12/23/2012 12:05 PM   LDLCALC 89 12/23/2012 12:05 PM    Wt Readings from Last 3 Encounters:  07/19/18 284 lb (128.8 kg)  06/14/18 278 lb (126.1 kg)  05/20/18 287 lb 6.4 oz (130.4 kg)     Objective:    Vital Signs:  Ht 6\' 1"  (1.854 m)   Wt 284 lb (128.8 kg)   BMI 37.47 kg/m    Exam not done as televisit  ASSESSMENT & PLAN:    1   Chronic systolic CHF   Continue current regimen   WIll follow   2  PAF   COntinue amiodarone and Xarelto  3  Hx HTN   WIll need to follow BP     COVID-19 Education: The signs and symptoms of COVID-19 were discussed with the patient and how to seek care for testing (follow up with PCP or arrange E-visit).  The importance of social distancing was discussed today.  Time:   Today, I have spent 20 minutes with the patient with telehealth technology discussing the above problems.     Medication Adjustments/Labs and Tests Ordered: Current medicines are reviewed at length with the patient today.  Concerns regarding medicines are outlined above.   Tests Ordered: No orders of the defined types were placed in this encounter.   Medication Changes: No orders of the defined types were placed in this encounter.   Disposition:  Follow up in the fall  Signed, Dorris Carnes, MD  07/19/2018 12:10 PM    [place]

## 2018-07-22 ENCOUNTER — Ambulatory Visit: Payer: Medicare Other | Admitting: Internal Medicine

## 2018-07-22 ENCOUNTER — Telehealth: Payer: Self-pay | Admitting: Internal Medicine

## 2018-07-22 NOTE — Telephone Encounter (Signed)
Called patient regarding upcoming Webex appointment, patient is notified and e-mail has been sent. °

## 2018-07-24 ENCOUNTER — Encounter: Payer: Self-pay | Admitting: Internal Medicine

## 2018-07-24 ENCOUNTER — Inpatient Hospital Stay (HOSPITAL_BASED_OUTPATIENT_CLINIC_OR_DEPARTMENT_OTHER): Payer: Medicare Other | Admitting: Internal Medicine

## 2018-07-24 DIAGNOSIS — J449 Chronic obstructive pulmonary disease, unspecified: Secondary | ICD-10-CM

## 2018-07-24 DIAGNOSIS — I4891 Unspecified atrial fibrillation: Secondary | ICD-10-CM | POA: Diagnosis not present

## 2018-07-24 DIAGNOSIS — R634 Abnormal weight loss: Secondary | ICD-10-CM | POA: Diagnosis not present

## 2018-07-24 DIAGNOSIS — Z7901 Long term (current) use of anticoagulants: Secondary | ICD-10-CM

## 2018-07-24 DIAGNOSIS — Z9221 Personal history of antineoplastic chemotherapy: Secondary | ICD-10-CM

## 2018-07-24 DIAGNOSIS — E039 Hypothyroidism, unspecified: Secondary | ICD-10-CM | POA: Diagnosis not present

## 2018-07-24 DIAGNOSIS — Z9225 Personal history of immunosupression therapy: Secondary | ICD-10-CM

## 2018-07-24 DIAGNOSIS — E785 Hyperlipidemia, unspecified: Secondary | ICD-10-CM

## 2018-07-24 DIAGNOSIS — Z923 Personal history of irradiation: Secondary | ICD-10-CM

## 2018-07-24 DIAGNOSIS — I5022 Chronic systolic (congestive) heart failure: Secondary | ICD-10-CM | POA: Diagnosis not present

## 2018-07-24 DIAGNOSIS — C3490 Malignant neoplasm of unspecified part of unspecified bronchus or lung: Secondary | ICD-10-CM

## 2018-07-24 DIAGNOSIS — I110 Hypertensive heart disease with heart failure: Secondary | ICD-10-CM

## 2018-07-24 DIAGNOSIS — C3411 Malignant neoplasm of upper lobe, right bronchus or lung: Secondary | ICD-10-CM

## 2018-07-24 DIAGNOSIS — Z79899 Other long term (current) drug therapy: Secondary | ICD-10-CM

## 2018-07-24 DIAGNOSIS — M1990 Unspecified osteoarthritis, unspecified site: Secondary | ICD-10-CM | POA: Diagnosis not present

## 2018-07-24 DIAGNOSIS — G4733 Obstructive sleep apnea (adult) (pediatric): Secondary | ICD-10-CM

## 2018-07-24 DIAGNOSIS — Z9989 Dependence on other enabling machines and devices: Secondary | ICD-10-CM

## 2018-07-24 NOTE — Progress Notes (Signed)
[place] Telephone:([phone]) [phone]   [phone]) [phone]  PROGRESS NOTE FOR TELEMEDICINE VISITS  Lucianne Lei, MD [place] [address]  I connected with@ on 07/24/18 at  9:30 AM EDT by video enabled telemedicine visit and verified that I am speaking with the correct person using two identifiers.   I discussed the limitations, risks, security and privacy concerns of performing an evaluation and management service by telemedicine and the availability of in-person appointments. I also discussed with the patient that there may be a patient responsible charge related to this service. The patient expressed understanding and agreed to proceed.  Other persons participating in the visit and their role in the encounter:  Norton Blizzard, thoracic navigator  Patient's location: Home Provider's location: [place]  DIAGNOSIS: Stage IIIA (T1a, N2, M0) non-small cell lung cancer, adenocarcinoma presented with right upper lobe lung nodule in addition to mediastinal lymphadenopathy diagnosed in March 2018.  Biomarker Findings Tumor Mutational Burden - TMB-Intermediate (8 Muts/Mb) Microsatellite Status - MS-Stable Genomic Findings For a complete list of the genes assayed, please refer to the Appendix. ERBB2 amplification - equivocal? DNMT3A K484f*192 FUBP1 Q40* KEAP1 [id number] TP53 C275F 7 Disease relevant genes with no reportable alterations: EGFR, KRAS, ALK, BRAF, MET, RET, ROS1   PRIOR THERAPY:  1) Course of concurrent chemoradiation with weekly carboplatin for AUC of 2 and paclitaxel 45 MG/M2. Status post 6 cycles. Last cycle was given 10/16/2016. 2)  Consolidation treatment with immunotherapy with Imfinzi (Durvalumab) 10 MG/M2 every 2 weeks. First dose 12/21/2016.  Status post 26 cycles.  CURRENT THERAPY: Observation.  INTERVAL HISTORY: Jason Riera68.o. male has a WebEx virtual visit with me today for evaluation and discussion  of his scan results.  The patient is feeling fine today with no concerning complaints.  He denied having any current chest pain, shortness of breath, cough or hemoptysis.  He denied having any fever or chills.  He intentionally lost around 6 pounds since his last visit.  He denied having any nausea, vomiting, diarrhea or constipation.  He has no headache or visual changes.  He had repeat CT scan of the chest performed recently and he is here for evaluation and discussion of his scan results.   MEDICAL HISTORY: Past Medical History:  Diagnosis Date  . Adenocarcinoma of right lung, stage 3 ([facility]CK[facility] Lakes5/23/2018  . Anemia   . Arthritis    "hx right hip"  . Asthma    "when I was a child"  . Atrial fibrillation (HCC)    Amiodarone started 10/2011; Coumadin  . 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 St. Jude ICD implantation 10/03/12.  . Chronic anticoagulation   . Chronic fatigue 10/18/2016  . Chronic systolic heart failure (HCLos Al[facility]   a. Echo 7/13: EF 25%;  b. echo 04/2012:  Mild LVH, EF 30-35%, Gr 1 DD, mild AI, mild MR, mild LAE  . COPD (chronic obstructive pulmonary disease) ([facility]  . Dyslipidemia   . Gout   . History of blood transfusion 10/15/2013   "don't know where the blood's going; HgB down to 5"  . Hyperlipidemia   . Hypertension   . Hypothyroidism   . NICM (nonischemic cardiomyopathy) (HCAustin   [facility] [place]/14:  minimal CAD  . Obesity   . OSA on CPAP   . Tobacco abuse     ALLERGIES:  has No Known Allergies.  MEDICATIONS:  Current Outpatient Medications  Medication Sig Dispense Refill  .  acetaminophen (TYLENOL) 500 MG tablet Take 1,000 mg by mouth every 6 (six) hours as needed for mild pain.    [name] albuterol (PROVENTIL HFA;VENTOLIN HFA) 108 (90 Base) MCG/ACT inhaler INHALE 2 PUFFS INTO THE LUNGS EVERY 4 (FOUR) HOURS AS NEEDED FOR WHEEZING OR SHORTNESS OF BREATH 8.5 Inhaler 2  . amiodarone (PACERONE) 100 MG tablet Take 1 tablet (100 mg total) by mouth daily. 90  tablet 3  . atorvastatin (LIPITOR) 20 MG tablet TAKE 1 TABLET BY MOUTH EVERY DAY 90 tablet 2  . carvedilol (COREG) 12.5 MG tablet Take 1 tablet (12.5 mg total) by mouth 2 (two) times daily. 180 tablet 3  . [employer] D3 2000 units CAPS Take 2,000 Units by mouth daily.   11  . feeding supplement, ENSURE ENLIVE, (ENSURE ENLIVE) LIQD Take 237 mLs by mouth 2 (two) times daily between meals. 237 mL 12  . ferrous gluconate (FERGON) 324 MG tablet Take 1 tablet (324 mg total) by mouth 3 (three) times daily with meals. 90 tablet 7  . Fluticasone-Umeclidin-Vilant (TRELEGY ELLIPTA) 100-62.5-25 MCG/INH AEPB Inhale 1 puff into the lungs daily. 3 each 3  . furosemide (LASIX) 40 MG tablet Take 1 tablet (40 mg total) by mouth daily. 30 tablet 6  . hydrALAZINE (APRESOLINE) 100 MG tablet TAKE 1 TABLET BY MOUTH 3 (THREE) TIMES DAILY. 270 tablet 3  . levothyroxine (SYNTHROID, LEVOTHROID) 50 MCG tablet TAKE 1 TABLET BY MOUTH DAILY BEFORE BREAKFAST (Patient taking differently: Take 50 mcg by mouth daily before breakfast. ) 90 tablet 3  . lidocaine-prilocaine (EMLA) cream APPLY GENEROUS AMOUNT TO PORT SITE AT LEAST 1 HR PRIOR TO TREATMENT. DO NOT RUB IN. (Patient taking differently: Apply 1 application topically See admin instructions. Apply generous amount to port site at least 1 hr prior to treatment.  DO NOT RUB IN.) 30 g 1  . lisinopril (PRINIVIL,ZESTRIL) 40 MG tablet Take 1 tablet (40 mg total) by mouth daily. 90 tablet 2  . magnesium oxide (MAG-OX) 400 (241.3 Mg) MG tablet Take 1 tablet (400 mg total) by mouth daily. 30 tablet 0  . Polyvinyl Alcohol-Povidone (REFRESH OP) Place 1 drop into both eyes every morning.    . Potassium Chloride ER 20 MEQ TBCR TAKE 1 TABLET BY MOUTH EVERY DAY 90 tablet 2  . rivaroxaban (XARELTO) 20 MG TABS tablet Take 1 tablet (20 mg total) by mouth daily. 30 tablet 1  . sildenafil (REVATIO) 20 MG tablet Take as directed. Take 2-5 tablets by mouth as needed (Patient taking differently: Take 20-100  mg by mouth See admin instructions. Take as directed. Take 2-5 tablets by mouth as needed) 30 tablet 1  . spironolactone (ALDACTONE) 25 MG tablet Take 1 tablet (25 mg total) by mouth daily. 30 tablet 6  . ULORIC 40 MG tablet Take 1 tablet (40 mg total) by mouth daily. 30 tablet 0   No current facility-administered medications for this visit.     SURGICAL HISTORY:  Past Surgical History:  Procedure Laterality Date  . CARDIAC DEFIBRILLATOR PLACEMENT  2014  . CARDIOVERSION  2011  . COLONOSCOPY WITH PROPOFOL Left 10/17/2013   Procedure: COLONOSCOPY WITH PROPOFOL;  Surgeon: Inda Castle, MD;  Location: [facility];  Service: Endoscopy;  Laterality: Left;  . ESOPHAGOGASTRODUODENOSCOPY N/A 10/17/2013   Procedure: ESOPHAGOGASTRODUODENOSCOPY (EGD);  Surgeon: Inda Castle, MD;  Location: [facility];  Service: Endoscopy;  Laterality: N/A;  . ESOPHAGOGASTRODUODENOSCOPY (EGD) WITH PROPOFOL N/A 06/14/2018   Procedure: ESOPHAGOGASTRODUODENOSCOPY (EGD) WITH PROPOFOL;  Surgeon: Nelida Meuse  III, MD;  Location: [facility];  Service: Gastroenterology;  Laterality: N/A;  . GIVENS CAPSULE STUDY N/A 10/29/2013   Procedure: GIVENS CAPSULE STUDY;  Surgeon: Inda Castle, MD;  Location: [facility];  Service: Endoscopy;  Laterality: N/A;  . ICD GENERATOR CHANGEOUT N/A 02/13/2018   Procedure: ICD GENERATOR CHANGEOUT;  Surgeon: Constance Haw, MD;  Location: [facility];  Service: Cardiovascular;  Laterality: N/A;  . IMPLANTABLE CARDIOVERTER DEFIBRILLATOR IMPLANT Left 10/03/2012   Procedure: IMPLANTABLE CARDIOVERTER DEFIBRILLATOR IMPLANT;  Surgeon: Deboraha Sprang, MD;  Location: [facility] CATH LAB;  Service: Cardiovascular;  Laterality: Left;  . IR FLUORO GUIDE PORT INSERTION RIGHT  09/21/2016  . IR US GUIDE VASC ACCESS RIGHT  09/21/2016  . JOINT REPLACEMENT    . SAVORY DILATION N/A 06/14/2018   Procedure: SAVORY DILATION;  Surgeon: Doran Stabler, MD;  Location: [facility];  Service:  Gastroenterology;  Laterality: N/A;  . TONSILLECTOMY  1950's  . TOTAL HIP ARTHROPLASTY Right 11/25/1997  . VIDEO BRONCHOSCOPY WITH ENDOBRONCHIAL ULTRASOUND N/A 08/09/2016   Procedure: VIDEO BRONCHOSCOPY WITH ENDOBRONCHIAL ULTRASOUND;  Surgeon: Javier Glazier, MD;  Location: [facility];  Service: Thoracic;  Laterality: N/A;    REVIEW OF SYSTEMS:  A comprehensive review of systems was negative except for: Respiratory: positive for dyspnea on exertion   LABORATORY DATA: Lab Results  Component Value Date   WBC 5.5 07/19/2018   HGB 11.6 (L) 07/19/2018   HCT 36.9 (L) 07/19/2018   MCV 93.4 07/19/2018   PLT 187 07/19/2018      Chemistry      Component Value Date/Time   NA 142 07/19/2018 0816   NA 141 02/27/2018 0855   NA 141 03/29/2017 1038   K 4.2 07/19/2018 0816   K 4.0 03/29/2017 1038   CL 108 07/19/2018 0816   CO2 26 07/19/2018 0816   CO2 27 03/29/2017 1038   BUN 29 (H) 07/19/2018 0816   BUN 25 02/27/2018 0855   BUN 28.6 (H) 03/29/2017 1038   CREATININE 1.89 (H) 07/19/2018 0816   CREATININE 1.6 (H) 03/29/2017 1038      Component Value Date/Time   CALCIUM 9.2 07/19/2018 0816   CALCIUM 9.3 03/29/2017 1038   ALKPHOS 121 07/19/2018 0816   ALKPHOS 105 03/29/2017 1038   AST 15 07/19/2018 0816   AST 18 03/29/2017 1038   ALT 12 07/19/2018 0816   ALT 13 03/29/2017 1038   BILITOT 0.3 07/19/2018 0816   BILITOT 0.62 03/29/2017 1038       RADIOGRAPHIC STUDIES: Ct Chest Wo Contrast  Result Date: 07/19/2018 CLINICAL DATA:  68 year old male with history of lung cancer status post radiation therapy (completed June 2018) and chemotherapy (completed June 2018). EXAM: CT CHEST WITHOUT CONTRAST TECHNIQUE: Multidetector CT imaging of the chest was performed following the standard protocol without IV contrast. COMPARISON:  Chest CT 04/19/2018. FINDINGS: Cardiovascular: Heart size is normal. There is no significant pericardial fluid, thickening or pericardial calcification. There is aortic  atherosclerosis, as well as atherosclerosis of the great vessels of the mediastinum and the coronary arteries, including calcified atherosclerotic plaque in the left main and left circumflex coronary arteries. Pacemaker lead terminating in the right ventricular apex. Right internal jugular PermCath with tip terminating in the distal superior vena cava. Mediastinum/Nodes: No pathologically enlarged mediastinal or hilar lymph nodes. Please note that accurate exclusion of hilar adenopathy is limited on noncontrast CT scans. Small hiatal hernia. No axillary lymphadenopathy. Lungs/Pleura: Chronic areas of architectural distortion and volume loss in  the medial aspect of the right lung, similar to prior studies, most compatible with areas of chronic postradiation fibrosis. 3 mm right upper lobe pulmonary nodule near the apex (axial image 20 of series 7), stable compared to prior examinations, favored to be benign. No other definite suspicious appearing pulmonary nodules or masses. No acute consolidative airspace disease. No pleural effusions. Upper Abdomen: Nonobstructive calculi are noted within the right kidney, largest of which measures 8 mm in the lower pole collecting system. Aortic atherosclerosis. Aortic at scotch that Musculoskeletal: There are no aggressive appearing lytic or blastic lesions noted in the visualized portions of the skeleton. IMPRESSION: 1. Stable post treatment related changes in the right hemithorax, without findings to suggest local recurrence of disease or metastatic disease in the thorax. 2. Nonobstructive calculi in the right renal collecting system measuring up to 8 mm in the lower pole. 3. Aortic atherosclerosis, in addition to two vessel coronary artery disease. Please note that although the presence of coronary artery calcium documents the presence of coronary artery disease, the severity of this disease and any potential stenosis cannot be assessed on this non-gated CT examination.  Assessment for potential risk factor modification, dietary therapy or pharmacologic therapy may be warranted, if clinically indicated. 4. Additional incidental findings, as above. Aortic Atherosclerosis (ICD10-I70.0). Electronically Signed   By: Vinnie Langton M.D.   On: 07/19/2018 10:02    ASSESSMENT AND PLAN: This is a very pleasant 68 years old African-American male with stage IIIA non-small cell lung cancer, adenocarcinoma.  The patient completed 6 weeks of concurrent chemoradiation with weekly carboplatin and paclitaxel and tolerated his treatment well except for odynophagia. The patient was then started on consolidation treatment with immunotherapy with Imfinzi (Durvalumab) status post 26 cycles.  The patient tolerated the previous course of his immunotherapy fairly well. He is currently on observation and feeling well with no concerning complaints. The patient had repeat CT scan of the chest performed recently.  I personally and independently reviewed the scans and discussed the results with the patient today. His scan showed no concerning findings for disease progression. I recommended for him to continue on observation with repeat CT scan of the chest in 3 months. For the right kidney stone, I recommended for the patient to increase his hydration and if he started having renal pain to schedule a visit with urology for evaluation. He was also advised to call immediately if he has any concerning symptoms in the interval. I discussed the assessment and treatment plan with the patient. The patient was provided an opportunity to ask questions and all were answered. The patient agreed with the plan and demonstrated an understanding of the instructions.   The patient was advised to call back or seek an in-person evaluation if the symptoms worsen or if the condition fails to improve as anticipated.  I provided 15 minutes of face-to-face video visit time during this encounter, and > 50% was spent  counseling as documented under my assessment & plan.  Eilleen Kempf, MD 07/24/2018 9:37 AM  Disclaimer: This note was dictated with voice recognition software. Similar sounding words can inadvertently be transcribed and may not be corrected upon review.

## 2018-07-25 ENCOUNTER — Telehealth: Payer: Self-pay | Admitting: Internal Medicine

## 2018-07-25 NOTE — Telephone Encounter (Signed)
Tried to reach regarding schedule °

## 2018-07-29 ENCOUNTER — Ambulatory Visit (INDEPENDENT_AMBULATORY_CARE_PROVIDER_SITE_OTHER): Payer: Medicare Other

## 2018-07-29 ENCOUNTER — Other Ambulatory Visit: Payer: Self-pay

## 2018-07-29 DIAGNOSIS — I5022 Chronic systolic (congestive) heart failure: Secondary | ICD-10-CM | POA: Diagnosis not present

## 2018-07-29 DIAGNOSIS — Z95810 Presence of automatic (implantable) cardiac defibrillator: Secondary | ICD-10-CM | POA: Diagnosis not present

## 2018-07-29 NOTE — Progress Notes (Signed)
EPIC Encounter for ICM Monitoring  Patient Name: Jason Russell is a 68 y.o. male Date: 07/29/2018 Primary Care Physican: Lucianne Lei, MD Primary Cardiologist:Ross Electrophysiologist: Caryl Comes 06/26/2018 LOVFIE:332 lbs 07/29/2018 Weight: 284 lbs   Heart Failure questions reviewed.  Pt asymptomatic.He may be drinking more fluids than normal.   Report: Thoracic impedanceabnormal since 07/25/2018 suggesting fluid accumulation.  Prescribed:Furosemide40 mg1tablet (40 mg total)daily. Potassium 20 mEq 1 tablet daily.  Labs: 04/19/2018 Creatinine 1.83, BUN 28, Potassium 3.8, Sodium 143, eGFR 38-44 02/27/2018 Creatinine 1.81, BUN 25, Potassium 3.6, Sodium 141, eGFR 38-44 02/13/2018 Creatinine 1.81, BUN 20, Potassium 3.7, Sodium 140, eGFR 37-43 01/09/2018 Creatinine1.95, BUN30, Potassium3.5, Sodium144, [id number] A complete set of results can be found in results review  Recommendations:Advised to limit salt and fluid intake.  Encouraged to call for fluid symptoms.  Follow-up plan: ICM clinic phone appointment on5/08/2018 to recheck fluid levels.   Copy of ICM check sent to Dr.Klein and Dr Harrington Challenger for review and recommendations if needed.    3 month ICM trend: 07/29/2018    1 Year ICM trend:       [name], RN 07/29/2018 11:00 AM

## 2018-08-05 ENCOUNTER — Other Ambulatory Visit: Payer: Self-pay | Admitting: Physician Assistant

## 2018-08-06 ENCOUNTER — Other Ambulatory Visit: Payer: Self-pay

## 2018-08-06 ENCOUNTER — Ambulatory Visit (INDEPENDENT_AMBULATORY_CARE_PROVIDER_SITE_OTHER): Payer: Medicare Other

## 2018-08-06 DIAGNOSIS — Z95810 Presence of automatic (implantable) cardiac defibrillator: Secondary | ICD-10-CM

## 2018-08-06 DIAGNOSIS — I5022 Chronic systolic (congestive) heart failure: Secondary | ICD-10-CM

## 2018-08-06 NOTE — Progress Notes (Signed)
EPIC Encounter for ICM Monitoring  Patient Name: Jason Russell is a 68 y.o. male Date: 08/06/2018 Primary Care Physican: Lucianne Lei, MD Primary Cardiologist:Ross Electrophysiologist: Caryl Comes 3/25/2020Weight:284 lbs 07/29/2018 Weight: 284 lbs 08/06/2018 Weight: 284 lbs   Heart Failure questions reviewed. Pt asymptomatic.He cut back on fluid intake and impedance is back to normal.   CorVue Thoracic impedancereturned to normal since 07/29/2018 transmission.  Prescribed:Furosemide40 mg1tablet (40 mg total)daily. Potassium 20 mEq 1 tablet daily.  Labs: 04/19/2018 Creatinine 1.83, BUN 28, Potassium 3.8, Sodium 143, eGFR 38-44 02/27/2018 Creatinine 1.81, BUN 25, Potassium 3.6, Sodium 141, eGFR 38-44 02/13/2018 Creatinine 1.81, BUN 20, Potassium 3.7, Sodium 140, eGFR 37-43 01/09/2018 Creatinine1.95, BUN30, Potassium3.5, Sodium144, [id number] A complete set of results can be found in results review  Recommendations:  Encouraged to call for fluid symptoms.  Follow-up plan: ICM clinic phone appointment on6/11/2018.   Copy of ICM check sent to [place].  3 month ICM trend: 08/06/2018    1 Year ICM trend:       [name], RN 08/06/2018 4:21 PM

## 2018-08-12 ENCOUNTER — Other Ambulatory Visit: Payer: Self-pay | Admitting: Adult Health

## 2018-08-17 ENCOUNTER — Other Ambulatory Visit: Payer: Self-pay | Admitting: Internal Medicine

## 2018-09-08 ENCOUNTER — Other Ambulatory Visit: Payer: Self-pay | Admitting: Adult Health

## 2018-09-09 ENCOUNTER — Ambulatory Visit (INDEPENDENT_AMBULATORY_CARE_PROVIDER_SITE_OTHER): Payer: Medicare Other

## 2018-09-09 ENCOUNTER — Other Ambulatory Visit: Payer: Self-pay | Admitting: Internal Medicine

## 2018-09-09 DIAGNOSIS — Z95810 Presence of automatic (implantable) cardiac defibrillator: Secondary | ICD-10-CM | POA: Diagnosis not present

## 2018-09-09 DIAGNOSIS — I5022 Chronic systolic (congestive) heart failure: Secondary | ICD-10-CM | POA: Diagnosis not present

## 2018-09-09 NOTE — Telephone Encounter (Signed)
Prescription refill request for Xarelto received.   Last office visit: Dr. Harrington Challenger ( 07-19-2018 ) Weight: 130.4 kg  (05-20-2018) Age: 68 yrs old Scr: 1.83 (04-19-2018) CrCl: 73 ml/min  Prescription refill sent.

## 2018-09-11 NOTE — Progress Notes (Signed)
EPIC Encounter for ICM Monitoring  Patient Name: Darrio Bade is a 68 y.o. male Date: 09/11/2018 Primary Care Physican: Lucianne Lei, MD Primary Cardiologist:Ross Electrophysiologist: Caryl Comes 08/06/2018 Weight: 284 lbs 09/11/2018 Weight: 284 lbs   Heart Failure questions reviewed. Pt asymptomatic.Discussed diet and patient thought he was adhering to low salt diet but has is not checking food labels.  Eating canned vegetables and since April has been eating more take out food.  Explained restaurant foods are normally >3000 mg per entrees.    CorVue Thoracic impedance normalbut was abnormal suggesting fluid accumulation from 5/11 - 5/23 and 6/2 - 6/5.  Advised report is showing greater amount of fluid and longer periods of time since April   Prescribed:Furosemide40 mg1tablet (40 mg total)daily. Potassium 20 mEq 1 tablet daily.  Labs: 04/19/2018 Creatinine 1.83, BUN 28, Potassium 3.8, Sodium 143, GFR 38-44 02/27/2018 Creatinine 1.81, BUN 25, Potassium 3.6, Sodium 141, GFR 38-44 02/13/2018 Creatinine 1.81, BUN 20, Potassium 3.7, Sodium 140, GFR 37-43 01/09/2018 Creatinine1.95, BUN30, Potassium3.5, Sodium144, [id number] A complete set of results can be found in results review  Recommendations:Advised to limit salt intake to 2000 mg daily and look at food labels for salt amounts.  No changes today.  Patient uses mychart and is agreeable to send remote transmission results if normal.   Follow-up plan: ICM clinic phone appointment on7/13/2020.   Copy of ICM check sent toDr.Klein.  3 month ICM trend: 09/09/2018    1 Year ICM trend:       [name], RN 09/11/2018 10:44 AM

## 2018-09-12 DIAGNOSIS — R799 Abnormal finding of blood chemistry, unspecified: Secondary | ICD-10-CM | POA: Diagnosis not present

## 2018-09-12 DIAGNOSIS — I272 Pulmonary hypertension, unspecified: Secondary | ICD-10-CM | POA: Diagnosis not present

## 2018-09-12 DIAGNOSIS — C3490 Malignant neoplasm of unspecified part of unspecified bronchus or lung: Secondary | ICD-10-CM | POA: Diagnosis not present

## 2018-09-12 DIAGNOSIS — M1000 Idiopathic gout, unspecified site: Secondary | ICD-10-CM | POA: Diagnosis not present

## 2018-09-12 DIAGNOSIS — I10 Essential (primary) hypertension: Secondary | ICD-10-CM | POA: Diagnosis not present

## 2018-09-12 DIAGNOSIS — I130 Hypertensive heart and chronic kidney disease with heart failure and stage 1 through stage 4 chronic kidney disease, or unspecified chronic kidney disease: Secondary | ICD-10-CM | POA: Diagnosis not present

## 2018-09-12 DIAGNOSIS — Z716 Tobacco abuse counseling: Secondary | ICD-10-CM | POA: Diagnosis not present

## 2018-09-12 DIAGNOSIS — N5201 Erectile dysfunction due to arterial insufficiency: Secondary | ICD-10-CM | POA: Diagnosis not present

## 2018-09-12 DIAGNOSIS — N400 Enlarged prostate without lower urinary tract symptoms: Secondary | ICD-10-CM | POA: Diagnosis not present

## 2018-09-12 DIAGNOSIS — E02 Subclinical iodine-deficiency hypothyroidism: Secondary | ICD-10-CM | POA: Diagnosis not present

## 2018-09-17 IMAGING — DX DG CHEST 1V PORT
1 series · 1 of 1 positions shown · non-contrast
Comparison: Chest CT scan of today's date and portable chest x-ray
August 09, 2016

CLINICAL DATA: Follow-up of biopsy of a right apical nodule.
History of asthma -COPD, atrial fibrillation, former smoker.

EXAM:
PORTABLE CHEST 1 VIEW

[chest]
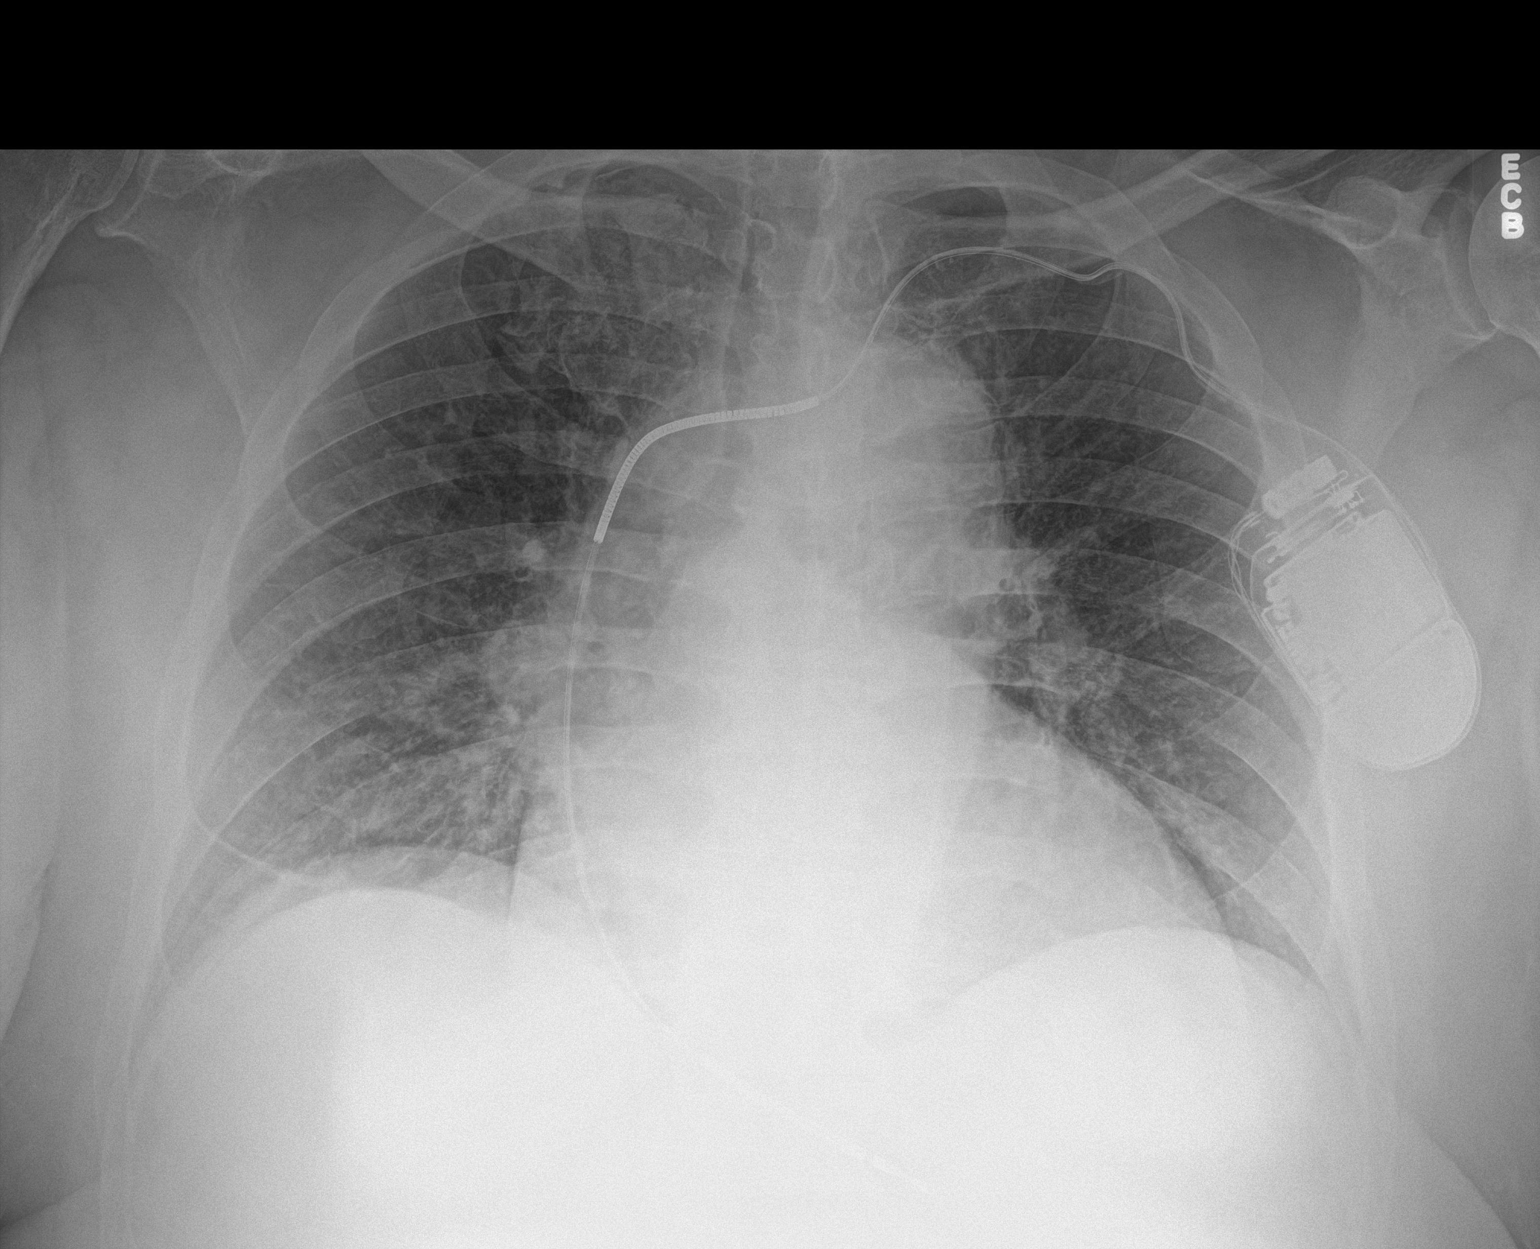

[1 of 1 positions shown; findings below may reference images not displayed]

FINDINGS: There is a tiny right apical pneumothorax amounting to less than 5%
of the lung volume. The interstitial markings in the right upper
lobe are slightly more conspicuous as well. Elsewhere no acute
pulmonary abnormality is observed. The cardiac silhouette remains
enlarged. The central pulmonary vascularity is less prominent than
on the August 2016 study. The ICD is in stable position. There is
tortuosity of the descending thoracic aorta.
IMPRESSION: 5% or less right apical pneumothorax similar to that visible on
today's CT scan.

## 2018-09-19 ENCOUNTER — Encounter: Payer: Medicare Other | Admitting: *Deleted

## 2018-09-20 ENCOUNTER — Telehealth: Payer: Self-pay

## 2018-09-20 NOTE — Telephone Encounter (Signed)
Left message for patient to remind of missed remote transmission.  

## 2018-09-25 ENCOUNTER — Other Ambulatory Visit: Payer: Self-pay | Admitting: Internal Medicine

## 2018-10-01 ENCOUNTER — Other Ambulatory Visit: Payer: Self-pay | Admitting: Internal Medicine

## 2018-10-02 IMAGING — US IR FLUORO GUIDE CV LINE*R*
1 series · 2 of 2 positions shown · non-contrast
Comparison: none

CLINICAL DATA: Lung carcinoma, needs durable venous access for
chemotherapy regimen.
TECHNIQUE: The procedure, risks, benefits, and alternatives were explained to
the patient. Questions regarding the procedure were encouraged and
answered. The patient understands and consents to the procedure. As
antibiotic prophylaxis, cefazolin 2 g was ordered pre-procedure and
administered intravenously within one hour of incision. Patency of
the right IJ vein was confirmed with ultrasound with image
documentation. An appropriate skin site was determined. Skin site
was marked. Region was prepped using maximum barrier technique
including cap and mask, sterile gown, sterile gloves, large sterile
sheet, and Chlorhexidine as cutaneous antisepsis. The region was
infiltrated locally with 1% lidocaine. Under real-time ultrasound
guidance, the right IJ vein was accessed with a 21 gauge
micropuncture needle; the needle tip within the vein was confirmed
with ultrasound image documentation. Needle was exchanged over a 018
guidewire for transitional dilator which allowed passage of the
Benson wire into the IVC. Over this, the transitional dilator was
exchanged for a 5 French MPA catheter. A small incision was made on
the right anterior chest wall and a subcutaneous pocket fashioned.
The power-injectable port was positioned and its catheter tunneled
to the right IJ dermatotomy site. The MPA catheter was exchanged
over an Amplatz wire for a peel-away sheath, through which the port
catheter, which had been trimmed to the appropriate length, was
advanced and positioned under fluoroscopy with its tip at the
cavoatrial junction. Spot chest radiograph confirms good catheter
position and no pneumothorax. The pocket was closed with deep
interrupted and subcuticular continuous 3-0 Monocryl sutures. The
port was flushed per protocol. The incisions were covered with
Dermabond then covered with a sterile dressing.

COMPLICATIONS:
COMPLICATIONS
None immediate

[Series 1: ir fluoro/shunt/fist · 2 of 2 slices shown]
[im 1/2]
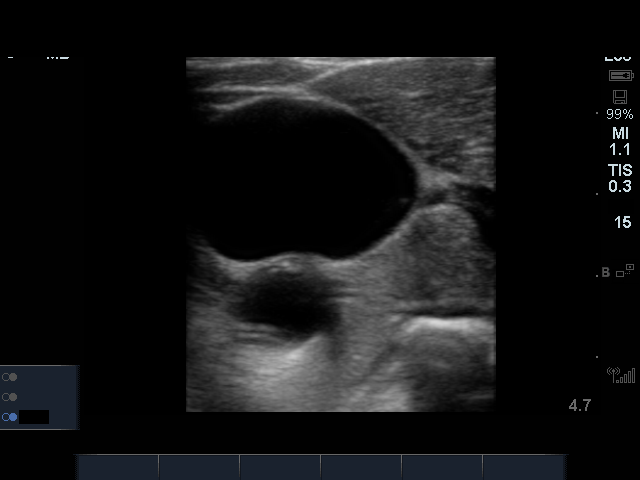
[im 2/2]
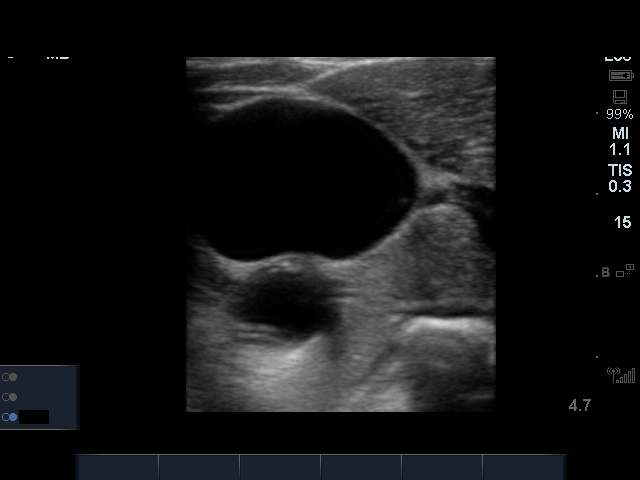

[2 of 2 positions shown; findings below may reference images not displayed]

EXAM:
TUNNELED PORT CATHETER PLACEMENT WITH ULTRASOUND AND FLUOROSCOPIC
GUIDANCE

FLUOROSCOPY TIME:  0.1 minute, 65  uOymM DAP

ANESTHESIA/SEDATION:
Intravenous Fentanyl and Versed were administered as conscious
sedation during continuous monitoring of the patient's level of
consciousness and physiological / cardiorespiratory status by the
radiology RN, with a total moderate sedation time of 13 minutes.
IMPRESSION: Technically successful right IJ power-injectable port catheter
placement. Ready for routine use.

## 2018-10-14 ENCOUNTER — Ambulatory Visit (INDEPENDENT_AMBULATORY_CARE_PROVIDER_SITE_OTHER): Payer: Medicare Other

## 2018-10-14 DIAGNOSIS — I5022 Chronic systolic (congestive) heart failure: Secondary | ICD-10-CM

## 2018-10-14 DIAGNOSIS — Z95810 Presence of automatic (implantable) cardiac defibrillator: Secondary | ICD-10-CM

## 2018-10-16 ENCOUNTER — Telehealth: Payer: Self-pay

## 2018-10-16 NOTE — Progress Notes (Signed)
EPIC Encounter for ICM Monitoring  Patient Name: Jason Russell is a 68 y.o. male Date: 10/16/2018 Primary Care Physican: Lucianne Lei, MD Primary Cardiologist:Ross Electrophysiologist: Caryl Comes 08/06/2018 Weight: 284 lbs 09/11/2018 Weight: 284 lbs   Attempted call to patient and unable to reach.  Left detailed message per DPR regarding transmission. Transmission reviewed.   CorVueThoracic impedancesuggesting possible fluid accumulation starting 10/09/2018 and trending close to baseline.     Prescribed:Furosemide40 mg1tablet (40 mg total)daily. Potassium 20 mEq 1 tablet daily.  Labs: 04/19/2018 Creatinine 1.83, BUN 28, Potassium 3.8, Sodium 143, GFR 38-44 02/27/2018 Creatinine 1.81, BUN 25, Potassium 3.6, Sodium 141, GFR 38-44 02/13/2018 Creatinine 1.81, BUN 20, Potassium 3.7, Sodium 140, GFR 37-43 01/09/2018 Creatinine1.95, BUN30, Potassium3.5, Sodium144, [id number] A complete set of results can be found in results review  Recommendations:Left voice mail with ICM number and encouraged to call if experiencing any fluid symptoms.  Follow-up plan: ICM clinic phone appointment on8/17/2020.   Copy of ICM check sent toDr.Klein.  Direct Trend View 10/13/2018  [name], RN 10/16/2018 3:20 PM

## 2018-10-16 NOTE — Telephone Encounter (Signed)
Remote ICM transmission received.  Attempted call to patient regarding ICM remote transmission and left detailed message, per DPR, with next ICM remote transmission date of 11/18/2018.  Advised to return call for any fluid symptoms or questions.

## 2018-10-21 ENCOUNTER — Other Ambulatory Visit: Payer: Medicare Other

## 2018-10-21 ENCOUNTER — Other Ambulatory Visit: Payer: Self-pay | Admitting: Physician Assistant

## 2018-10-21 ENCOUNTER — Other Ambulatory Visit: Payer: Self-pay

## 2018-10-21 ENCOUNTER — Inpatient Hospital Stay: Payer: Medicare Other | Attending: Internal Medicine

## 2018-10-21 ENCOUNTER — Ambulatory Visit (HOSPITAL_COMMUNITY)
Admission: RE | Admit: 2018-10-21 | Discharge: 2018-10-21 | Disposition: A | Discharge status: Home / Self Care | Payer: Medicare Other | Source: Ambulatory Visit | Attending: Internal Medicine | Admitting: Internal Medicine

## 2018-10-21 DIAGNOSIS — I4891 Unspecified atrial fibrillation: Secondary | ICD-10-CM | POA: Insufficient documentation

## 2018-10-21 DIAGNOSIS — C3411 Malignant neoplasm of upper lobe, right bronchus or lung: Secondary | ICD-10-CM | POA: Insufficient documentation

## 2018-10-21 DIAGNOSIS — I5022 Chronic systolic (congestive) heart failure: Secondary | ICD-10-CM | POA: Insufficient documentation

## 2018-10-21 DIAGNOSIS — J449 Chronic obstructive pulmonary disease, unspecified: Secondary | ICD-10-CM | POA: Insufficient documentation

## 2018-10-21 DIAGNOSIS — C3490 Malignant neoplasm of unspecified part of unspecified bronchus or lung: Secondary | ICD-10-CM

## 2018-10-21 DIAGNOSIS — Z79899 Other long term (current) drug therapy: Secondary | ICD-10-CM | POA: Insufficient documentation

## 2018-10-21 DIAGNOSIS — R911 Solitary pulmonary nodule: Secondary | ICD-10-CM | POA: Diagnosis not present

## 2018-10-21 DIAGNOSIS — I110 Hypertensive heart disease with heart failure: Secondary | ICD-10-CM | POA: Insufficient documentation

## 2018-10-21 DIAGNOSIS — E039 Hypothyroidism, unspecified: Secondary | ICD-10-CM | POA: Diagnosis not present

## 2018-10-21 DIAGNOSIS — E785 Hyperlipidemia, unspecified: Secondary | ICD-10-CM | POA: Diagnosis not present

## 2018-10-21 DIAGNOSIS — Z9989 Dependence on other enabling machines and devices: Secondary | ICD-10-CM | POA: Diagnosis not present

## 2018-10-21 DIAGNOSIS — G4733 Obstructive sleep apnea (adult) (pediatric): Secondary | ICD-10-CM | POA: Insufficient documentation

## 2018-10-21 DIAGNOSIS — Z9221 Personal history of antineoplastic chemotherapy: Secondary | ICD-10-CM | POA: Insufficient documentation

## 2018-10-21 DIAGNOSIS — Z7901 Long term (current) use of anticoagulants: Secondary | ICD-10-CM | POA: Diagnosis not present

## 2018-10-21 DIAGNOSIS — Z923 Personal history of irradiation: Secondary | ICD-10-CM | POA: Insufficient documentation

## 2018-10-21 LAB — CBC WITH DIFFERENTIAL (CANCER CENTER ONLY)
Abs Immature Granulocytes: 0.03 10*3/uL (ref 0.00–0.07)
Basophils Absolute: 0 10*3/uL (ref 0.0–0.1)
Basophils Relative: 1 %
Eosinophils Absolute: 0.1 10*3/uL (ref 0.0–0.5)
Eosinophils Relative: 3 %
HCT: 38.8 % — ABNORMAL LOW (ref 39.0–52.0)
Hemoglobin: 12.3 g/dL — ABNORMAL LOW (ref 13.0–17.0)
Immature Granulocytes: 1 %
Lymphocytes Relative: 11 %
Lymphs Abs: 0.6 10*3/uL — ABNORMAL LOW (ref 0.7–4.0)
MCH: 28.7 pg (ref 26.0–34.0)
MCHC: 31.7 g/dL (ref 30.0–36.0)
MCV: 90.7 fL (ref 80.0–100.0)
Monocytes Absolute: 0.8 10*3/uL (ref 0.1–1.0)
Monocytes Relative: 16 %
Neutro Abs: 3.4 10*3/uL (ref 1.7–7.7)
Neutrophils Relative %: 68 %
Platelet Count: 166 10*3/uL (ref 150–400)
RBC: 4.28 MIL/uL (ref 4.22–5.81)
RDW: 14.5 % (ref 11.5–15.5)
WBC Count: 4.9 10*3/uL (ref 4.0–10.5)
nRBC: 0 % (ref 0.0–0.2)

## 2018-10-21 LAB — CMP (CANCER CENTER ONLY)
ALT: 13 U/L (ref 0–44)
AST: 16 U/L (ref 15–41)
Albumin: 3.7 g/dL (ref 3.5–5.0)
Alkaline Phosphatase: 118 U/L (ref 38–126)
Anion gap: 9 (ref 5–15)
BUN: 31 mg/dL — ABNORMAL HIGH (ref 8–23)
CO2: 28 mmol/L (ref 22–32)
Calcium: 9.8 mg/dL (ref 8.9–10.3)
Chloride: 105 mmol/L (ref 98–111)
Creatinine: 1.71 mg/dL — ABNORMAL HIGH (ref 0.61–1.24)
GFR, Est AFR Am: 47 mL/min — ABNORMAL LOW (ref >60)
GFR, Estimated: 41 mL/min — ABNORMAL LOW (ref >60)
Glucose, Bld: 93 mg/dL (ref 70–99)
Potassium: 3.7 mmol/L (ref 3.5–5.1)
Sodium: 142 mmol/L (ref 135–145)
Total Bilirubin: 0.3 mg/dL (ref 0.3–1.2)
Total Protein: 7 g/dL (ref 6.5–8.1)

## 2018-10-22 ENCOUNTER — Other Ambulatory Visit: Payer: Self-pay | Admitting: Internal Medicine

## 2018-10-23 ENCOUNTER — Other Ambulatory Visit: Payer: Self-pay

## 2018-10-23 ENCOUNTER — Telehealth: Payer: Self-pay | Admitting: Internal Medicine

## 2018-10-23 ENCOUNTER — Inpatient Hospital Stay (HOSPITAL_BASED_OUTPATIENT_CLINIC_OR_DEPARTMENT_OTHER): Payer: Medicare Other | Admitting: Internal Medicine

## 2018-10-23 ENCOUNTER — Encounter: Payer: Self-pay | Admitting: Internal Medicine

## 2018-10-23 VITALS — BP 134/63 | HR 83 | Temp 97.7°F | Resp 18 | Ht 73.0 in | Wt 285.5 lb

## 2018-10-23 DIAGNOSIS — Z7901 Long term (current) use of anticoagulants: Secondary | ICD-10-CM | POA: Diagnosis not present

## 2018-10-23 DIAGNOSIS — I110 Hypertensive heart disease with heart failure: Secondary | ICD-10-CM | POA: Diagnosis not present

## 2018-10-23 DIAGNOSIS — E039 Hypothyroidism, unspecified: Secondary | ICD-10-CM

## 2018-10-23 DIAGNOSIS — I5022 Chronic systolic (congestive) heart failure: Secondary | ICD-10-CM

## 2018-10-23 DIAGNOSIS — J449 Chronic obstructive pulmonary disease, unspecified: Secondary | ICD-10-CM | POA: Diagnosis not present

## 2018-10-23 DIAGNOSIS — Z923 Personal history of irradiation: Secondary | ICD-10-CM

## 2018-10-23 DIAGNOSIS — G4733 Obstructive sleep apnea (adult) (pediatric): Secondary | ICD-10-CM

## 2018-10-23 DIAGNOSIS — C3411 Malignant neoplasm of upper lobe, right bronchus or lung: Secondary | ICD-10-CM | POA: Diagnosis not present

## 2018-10-23 DIAGNOSIS — Z79899 Other long term (current) drug therapy: Secondary | ICD-10-CM

## 2018-10-23 DIAGNOSIS — Z9989 Dependence on other enabling machines and devices: Secondary | ICD-10-CM

## 2018-10-23 DIAGNOSIS — I4891 Unspecified atrial fibrillation: Secondary | ICD-10-CM

## 2018-10-23 DIAGNOSIS — Z9221 Personal history of antineoplastic chemotherapy: Secondary | ICD-10-CM | POA: Diagnosis not present

## 2018-10-23 DIAGNOSIS — E785 Hyperlipidemia, unspecified: Secondary | ICD-10-CM | POA: Diagnosis not present

## 2018-10-23 DIAGNOSIS — C3490 Malignant neoplasm of unspecified part of unspecified bronchus or lung: Secondary | ICD-10-CM

## 2018-10-23 DIAGNOSIS — I10 Essential (primary) hypertension: Secondary | ICD-10-CM

## 2018-10-23 NOTE — Telephone Encounter (Signed)
Scheduled appt per 7/22 los - sent reminder letter in the mail.  Central radiology to contact pt to set up appt.

## 2018-10-23 NOTE — Progress Notes (Signed)
[facility] [place] Telephone:([phone]) [phone]   [phone]  OFFICE PROGRESS NOTE  Lucianne Lei, MD [address] [place] [address]  DIAGNOSIS: Stage IIIA (T1a, N2, M0) non-small cell lung cancer, adenocarcinoma presented with right upper lobe lung nodule in addition to mediastinal lymphadenopathy diagnosed in March 2018.  Biomarker Findings Tumor Mutational Burden - TMB-Intermediate (8 Muts/Mb) Microsatellite Status - MS-Stable Genomic Findings For a complete list of the genes assayed, please refer to the Appendix. ERBB2 amplification - equivocal DNMT3A K441f*192 FUBP1 Q40* KEAP1 [id number] TP53 C275F 7 Disease relevant genes with no reportable alterations: EGFR, KRAS, ALK, BRAF, MET, RET, ROS1   PRIOR THERAPY:  1) Course of concurrent chemoradiation with weekly carboplatin for AUC of 2 and paclitaxel 45 MG/M2. Status post 6 cycles. Last cycle was given 10/16/2016. 2)  Consolidation treatment with immunotherapy with Imfinzi (Durvalumab) 10 MG/M2 every 2 weeks. First dose 12/21/2016.  Status post 26 cycles.  CURRENT THERAPY: Observation.  INTERVAL HISTORY: Jason Olivares68. o. male returns to the clinic today for 3 months follow-up visit.  The patient is feeling fine today with no concerning complaints.  He denied having any current chest pain, shortness of breath, cough or hemoptysis.  He denied having any fever or chills.  He has no nausea, vomiting, diarrhea or constipation.  He denied having any headache or visual changes.  He has no recent weight loss or night sweats.  The patient had repeat CT scan of the chest performed recently and he is here for evaluation and discussion of his scan results.  MEDICAL HISTORY: Past Medical History:  Diagnosis Date   Adenocarcinoma of right lung, stage 3 ([facility]5/23/2018   Anemia    Arthritis    "hx right hip"   Asthma    "when I was a child"   Atrial fibrillation ([facility]   Amiodarone started  10/2011; Coumadin   Automatic implantable cardioverter-defibrillator in situ 10/03/2012   a. St. Jude ICD implantation 10/03/12.   Chronic anticoagulation    Chronic fatigue 10/06/52/6503 Chronic systolic heart failure ([facility]   a. Echo 7/13: EF 25%;  b. echo 04/2012:  Mild LVH, EF 30-35%, Gr 1 DD, mild AI, mild MR, mild LAE   COPD (chronic obstructive pulmonary disease) (HCC)    Dyslipidemia    Gout    History of blood transfusion 10/15/2013   "don't know where the blood's going; HgB down to 5"   Hyperlipidemia    Hypertension    Hypothyroidism    NICM (nonischemic cardiomyopathy) ([facility]   [place]/14:  minimal CAD   Obesity    OSA on CPAP    Tobacco abuse     ALLERGIES:  has No Known Allergies.  MEDICATIONS:  Current Outpatient Medications  Medication Sig Dispense Refill   acetaminophen (TYLENOL) 500 MG tablet Take 1,000 mg by mouth every 6 (six) hours as needed for mild pain.     albuterol (VENTOLIN HFA) 108 (90 Base) MCG/ACT inhaler INHALE 2 PUFFS INTO THE LUNGS EVERY 4 (FOUR) HOURS AS NEEDED FOR WHEEZING OR SHORTNESS OF BREATH 8.5 Inhaler 2   amiodarone (PACERONE) 100 MG tablet TAKE 1 TABLET BY MOUTH EVERY DAY 90 tablet 2   atorvastatin (LIPITOR) 20 MG tablet TAKE 1 TABLET BY MOUTH EVERY DAY 90 tablet 2   carvedilol (COREG) 12.5 MG tablet Take 1 tablet (12.5 mg total) by mouth 2 (two) times daily. 180 tablet 3   CVS D3  2000 units CAPS Take 2,000 Units by mouth daily.   11   feeding supplement, ENSURE ENLIVE, (ENSURE ENLIVE) LIQD Take 237 mLs by mouth 2 (two) times daily between meals. 237 mL 12   ferrous gluconate (FERGON) 324 MG tablet Take 1 tablet (324 mg total) by mouth 3 (three) times daily with meals. 90 tablet 7   Fluticasone-Umeclidin-Vilant (TRELEGY ELLIPTA) 100-62.5-25 MCG/INH AEPB Inhale 1 puff into the lungs daily. 3 each 3   furosemide (LASIX) 40 MG tablet TAKE 1 TABLET BY MOUTH EVERY DAY 90 tablet 0   hydrALAZINE (APRESOLINE) 100 MG tablet TAKE 1  TABLET BY MOUTH THREE TIMES A DAY 270 tablet 0   levothyroxine (SYNTHROID) 50 MCG tablet TAKE 1 TABLET BY MOUTH DAILY BEFORE BREAKFAST 90 tablet 2   lidocaine-prilocaine (EMLA) cream APPLY GENEROUS AMOUNT TO PORT SITE AT LEAST 1 HR PRIOR TO TREATMENT. DO NOT RUB IN. 30 g 1   lisinopril (ZESTRIL) 40 MG tablet TAKE 1 TABLET BY MOUTH EVERY DAY 90 tablet 2   magnesium oxide (MAG-OX) 400 (241.3 Mg) MG tablet Take 1 tablet (400 mg total) by mouth daily. 30 tablet 0   Polyvinyl Alcohol-Povidone (REFRESH OP) Place 1 drop into both eyes every morning.     Potassium Chloride ER 20 MEQ TBCR TAKE 1 TABLET BY MOUTH EVERY DAY 90 tablet 2   sildenafil (REVATIO) 20 MG tablet Take as directed. Take 2-5 tablets by mouth as needed (Patient taking differently: Take 20-100 mg by mouth See admin instructions. Take as directed. Take 2-5 tablets by mouth as needed) 30 tablet 1   spironolactone (ALDACTONE) 25 MG tablet TAKE 1 TABLET BY MOUTH EVERY DAY 90 tablet 2   ULORIC 40 MG tablet Take 1 tablet (40 mg total) by mouth daily. 30 tablet 0   XARELTO 20 MG TABS tablet TAKE 1 TABLET BY MOUTH EVERY DAY 30 tablet 5   No current facility-administered medications for this visit.     SURGICAL HISTORY:  Past Surgical History:  Procedure Laterality Date   CARDIAC DEFIBRILLATOR PLACEMENT  2014   CARDIOVERSION  2011   COLONOSCOPY WITH PROPOFOL Left 10/17/2013   Procedure: COLONOSCOPY WITH PROPOFOL;  Surgeon: Inda Castle, MD;  Location: [facility];  Service: Endoscopy;  Laterality: Left;   ESOPHAGOGASTRODUODENOSCOPY N/A 10/17/2013   Procedure: ESOPHAGOGASTRODUODENOSCOPY (EGD);  Surgeon: Inda Castle, MD;  Location: [facility];  Service: Endoscopy;  Laterality: N/A;   ESOPHAGOGASTRODUODENOSCOPY (EGD) WITH PROPOFOL N/A 06/14/2018   Procedure: ESOPHAGOGASTRODUODENOSCOPY (EGD) WITH PROPOFOL;  Surgeon: Doran Stabler, MD;  [facility]: [facility];  Service: Gastroenterology;  Laterality: N/A;   GIVENS  CAPSULE STUDY N/A 10/29/2013   Procedure: GIVENS CAPSULE STUDY;  Surgeon: Inda Castle, MD;  Location: [facility];  Service: Endoscopy;  Laterality: N/A;   ICD GENERATOR CHANGEOUT N/A 02/13/2018   Procedure: ICD GENERATOR CHANGEOUT;  Surgeon: Constance Haw, MD;  Location: [facility] CV LAB;  Service: Cardiovascular;  Laterality: N/A;   IMPLANTABLE CARDIOVERTER DEFIBRILLATOR IMPLANT Left 10/03/2012   Procedure: IMPLANTABLE CARDIOVERTER DEFIBRILLATOR IMPLANT;  Surgeon: Deboraha Sprang, MD;  [facility] CATH [facility];  Service: Cardiovascular;  Laterality: Left;   IR FLUORO GUIDE PORT INSERTION RIGHT  09/21/2016   IR US GUIDE VASC ACCESS RIGHT  09/21/2016   JOINT REPLACEMENT     SAVORY DILATION N/A 06/14/2018   Procedure: SAVORY DILATION;  Surgeon: Doran Stabler, MD;  [facility];  Service: Gastroenterology;  Laterality: N/A;   TONSILLECTOMY  1950's  TOTAL HIP ARTHROPLASTY Right 11/25/1997   VIDEO BRONCHOSCOPY WITH ENDOBRONCHIAL ULTRASOUND N/A 08/09/2016   Procedure: VIDEO BRONCHOSCOPY WITH ENDOBRONCHIAL ULTRASOUND;  Surgeon: Javier Glazier, MD;  [facility];  Service: Thoracic;  Laterality: N/A;    REVIEW OF SYSTEMS:  A comprehensive review of systems was negative.   PHYSICAL EXAMINATION: General appearance: alert, cooperative and no distress Head: Normocephalic, without obvious abnormality, atraumatic Neck: no adenopathy, no JVD, supple, symmetrical, trachea midline and thyroid not enlarged, symmetric, no tenderness/mass/nodules Lymph nodes: Cervical, supraclavicular, and axillary nodes normal. Resp: clear to auscultation bilaterally Back: symmetric, no curvature. ROM normal. No CVA tenderness. Cardio: regular rate and rhythm, S1, S2 normal, no murmur, click, rub or gallop GI: soft, non-tender; bowel sounds normal; no masses,  no organomegaly Extremities: extremities normal, atraumatic, no cyanosis or edema  ECOG PERFORMANCE STATUS: 1 - Symptomatic but  completely ambulatory  Blood pressure 134/63, pulse 83, temperature 97.7 F (36.5 C), temperature source Temporal, resp. rate 18, height _0  (1.854 m), weight 285 lb 8 oz (129.5 kg), SpO2 99 %.  LABORATORY DATA: Lab Results  Component Value Date   WBC 4.9 10/21/2018   HGB 12.3 (L) 10/21/2018   HCT 38.8 (L) 10/21/2018   MCV 90.7 10/21/2018   PLT 166 10/21/2018      Chemistry      Component Value Date/Time   NA 142 10/21/2018 1242   NA 141 02/27/2018 0855   NA 141 03/29/2017 1038   K 3.7 10/21/2018 1242   K 4.0 03/29/2017 1038   CL 105 10/21/2018 1242   CO2 28 10/21/2018 1242   CO2 27 03/29/2017 1038   BUN 31 (H) 10/21/2018 1242   BUN 25 02/27/2018 0855   BUN 28.6 (H) 03/29/2017 1038   CREATININE 1.71 (H) 10/21/2018 1242   CREATININE 1.6 (H) 03/29/2017 1038      Component Value Date/Time   CALCIUM 9.8 10/21/2018 1242   CALCIUM 9.3 03/29/2017 1038   ALKPHOS 118 10/21/2018 1242   ALKPHOS 105 03/29/2017 1038   AST 16 10/21/2018 1242   AST 18 03/29/2017 1038   ALT 13 10/21/2018 1242   ALT 13 03/29/2017 1038   BILITOT 0.3 10/21/2018 1242   BILITOT 0.62 03/29/2017 1038       RADIOGRAPHIC STUDIES: Ct Chest Wo Contrast  Result Date: 10/21/2018 CLINICAL DATA:  68 year old male with history of lung cancer diagnosed in April 2018. Therapy completed in November 2019. EXAM: CT CHEST WITHOUT CONTRAST TECHNIQUE: Multidetector CT imaging of the chest was performed following the standard protocol without IV contrast. COMPARISON:  Chest CT 07/19/2018. FINDINGS: Cardiovascular: Heart size is normal. There is no significant pericardial fluid, thickening or pericardial calcification. There is aortic atherosclerosis, as well as atherosclerosis of the great vessels of the mediastinum and the coronary arteries, including calcified atherosclerotic plaque in the left main and left anterior descending coronary arteries. Right internal jugular single-lumen porta cath with tip terminating in the  distal superior vena cava. Left-sided pacemaker device in place with lead tip terminating in the right ventricular apex. Mediastinum/Nodes: No pathologically enlarged mediastinal or hilar lymph nodes. Please note that accurate exclusion of hilar adenopathy is limited on noncontrast CT scans. Esophagus is unremarkable in appearance. Lungs/Pleura: Chronic volume loss and architectural distortion in the medial aspect of the right upper and lower lobes, similar to prior examinations, most compatible with postradiation fibrosis. Tiny 3 mm pulmonary nodule in the right upper lobe near the apex (axial image 29 of series 3), stable compared to  the prior study and likely benign. No other larger more suspicious appearing pulmonary nodules or masses are noted. No acute consolidative airspace disease. No pleural effusions. Upper Abdomen: Nonobstructive calculi in the right renal collecting system measuring up to 6 mm. Aortic atherosclerosis. Musculoskeletal: There are no aggressive appearing lytic or blastic lesions noted in the visualized portions of the skeleton. IMPRESSION: 1. Stable post treatment related changes in the right lung, without evidence of local recurrence of disease or metastatic disease in the thorax. 2. Nonobstructive calculi in the right renal collecting system measuring up to 6 mm. 3. Aortic atherosclerosis, in addition to left main and left anterior descending coronary artery disease. Please note that although the presence of coronary artery calcium documents the presence of coronary artery disease, the severity of this disease and any potential stenosis cannot be assessed on this non-gated CT examination. Assessment for potential risk factor modification, dietary therapy or pharmacologic therapy may be warranted, if clinically indicated. Aortic Atherosclerosis (ICD10-I70.0). Electronically Signed   By: Vinnie Langton M.D.   On: 10/21/2018 16:21    ASSESSMENT AND PLAN:  This is a very pleasant 68  years old African-American male with stage IIIA non-small cell lung cancer, adenocarcinoma.  The patient completed 6 weeks of concurrent chemoradiation with weekly carboplatin and paclitaxel and tolerated his treatment well except for odynophagia. The patient was then started on consolidation treatment with immunotherapy with Imfinzi (Durvalumab) status post 26 cycles.  He is currently on observation and he is feeling fine. He had repeat CT scan of the chest performed recently.  I personally and independently reviewed the scans and discussed the results with the patient today. His scan showed no concerning findings for disease progression. I recommended for the patient to continue on observation with repeat CT scan of the chest in 3 months. He was advised to call immediately if he has any concerning symptoms in the interval. The patient voices understanding of current disease status and treatment options and is in agreement with the current care plan. All questions were answered. The patient knows to call the clinic with any problems, questions or concerns. We can certainly see the patient much sooner if necessary. Disclaimer: This note was dictated with voice recognition software. Similar sounding words can inadvertently be transcribed and may not be corrected upon review.

## 2018-11-01 ENCOUNTER — Other Ambulatory Visit: Payer: Self-pay | Admitting: Physician Assistant

## 2018-11-03 ENCOUNTER — Other Ambulatory Visit: Payer: Self-pay | Admitting: Internal Medicine

## 2018-11-03 ENCOUNTER — Other Ambulatory Visit: Payer: Self-pay | Admitting: Physician Assistant

## 2018-11-12 ENCOUNTER — Other Ambulatory Visit: Payer: Self-pay | Admitting: Adult Health

## 2018-11-18 ENCOUNTER — Ambulatory Visit (INDEPENDENT_AMBULATORY_CARE_PROVIDER_SITE_OTHER): Payer: Medicare Other

## 2018-11-18 DIAGNOSIS — Z95810 Presence of automatic (implantable) cardiac defibrillator: Secondary | ICD-10-CM

## 2018-11-18 DIAGNOSIS — I5022 Chronic systolic (congestive) heart failure: Secondary | ICD-10-CM

## 2018-11-20 NOTE — Progress Notes (Signed)
EPIC Encounter for ICM Monitoring  Patient Name: Jason Russell is a 68 y.o. male Date: 11/20/2018 Primary Care Physican: Lucianne Lei, MD Primary Cardiologist:Ross Electrophysiologist: Caryl Comes 09/11/2018 Weight: 284 lbs   Transmission reviewed.   CorVueThoracic impedancenormal but suggested possible fluid accumulation starting 8/1 - 8/10.  Prescribed:Furosemide40 mg1tablet (40 mg total)daily. Potassium 20 mEq 1 tablet daily.  Labs: 04/19/2018 Creatinine 1.83, BUN 28, Potassium 3.8, Sodium 143, GFR 38-44 02/27/2018 Creatinine 1.81, BUN 25, Potassium 3.6, Sodium 141, GFR 38-44 02/13/2018 Creatinine 1.81, BUN 20, Potassium 3.7, Sodium 140, GFR 37-43 01/09/2018 Creatinine1.95, BUN30, Potassium3.5, Sodium144, [id number] A complete set of results can be found in results review  Recommendations:None  Follow-up plan: ICM clinic phone appointment on10/03/2019. OV with Dr Caryl Comes 11/26/2018  Copy of ICM check sent toDr.Klein.  3 month ICM trend: 11/18/2018    1 Year ICM trend:       [name], RN 11/20/2018 12:40 PM

## 2018-11-26 ENCOUNTER — Other Ambulatory Visit: Payer: Self-pay

## 2018-11-26 ENCOUNTER — Ambulatory Visit (INDEPENDENT_AMBULATORY_CARE_PROVIDER_SITE_OTHER): Payer: Medicare Other | Admitting: Internal Medicine

## 2018-11-26 ENCOUNTER — Encounter: Payer: Self-pay | Admitting: Internal Medicine

## 2018-11-26 VITALS — BP 112/66 | HR 69 | Ht 73.0 in | Wt 288.0 lb

## 2018-11-26 DIAGNOSIS — Z95810 Presence of automatic (implantable) cardiac defibrillator: Secondary | ICD-10-CM | POA: Diagnosis not present

## 2018-11-26 DIAGNOSIS — I480 Paroxysmal atrial fibrillation: Secondary | ICD-10-CM | POA: Diagnosis not present

## 2018-11-26 DIAGNOSIS — I10 Essential (primary) hypertension: Secondary | ICD-10-CM

## 2018-11-26 DIAGNOSIS — I428 Other cardiomyopathies: Secondary | ICD-10-CM | POA: Diagnosis not present

## 2018-11-26 LAB — CUP PACEART INCLINIC DEVICE CHECK
Battery Remaining Longevity: 97 mo
Brady Statistic RV Percent Paced: 0.02 %
Date Time Interrogation Session: 20200825152342
HighPow Impedance: 34.4531
Implantable Lead Implant Date: 20140703
Implantable Lead Location: 753860
Implantable Pulse Generator Implant Date: 20191113
Lead Channel Impedance Value: 375 Ohm
Lead Channel Pacing Threshold Amplitude: 0.5 V
Lead Channel Pacing Threshold Amplitude: 0.5 V
Lead Channel Pacing Threshold Pulse Width: 0.5 ms
Lead Channel Pacing Threshold Pulse Width: 0.5 ms
Lead Channel Sensing Intrinsic Amplitude: 3.3 mV
Lead Channel Setting Pacing Amplitude: 2.5 V
Lead Channel Setting Pacing Pulse Width: 0.5 ms
Lead Channel Setting Sensing Sensitivity: 0.5 mV
Pulse Gen Serial Number: 9820959

## 2018-11-26 MED ORDER — AMIODARONE HCL 100 MG PO TABS: ORAL_TABLET | ORAL | 3 refills | Status: DC

## 2018-11-26 NOTE — Patient Instructions (Signed)
Medication Instructions:  1) DECREASE AMIODARONE to 1 tablet 5 days a week  Labwork: TODAY: TSH  Testing/Procedures: None  Follow-Up: Your provider wants you to follow-up in: 1 year. You will receive a reminder letter in the mail two months in advance. If you don't receive a letter, please call our office to schedule the follow-up appointment.

## 2018-11-26 NOTE — Progress Notes (Signed)
Patient Care Team: Lucianne Lei, MD as PCP - General (Family Medicine) Fay Records, MD as PCP - Cardiology (Cardiology)   HPI  Jason Russell is a 67 y.o. male Seen in follow-up for an ICD implanted 2014 for primary prevention in the context of nonischemic heart disease. He also has a history of paroxysmal atrial fibrillation for which he takes amio and anticoagulation with Rivaroxaban   Chart was reviewed.  Amiodarone was started in 2013 for atrial fibrillation with a rapid rate and ejection fraction of 25% and prolonged QT  11/19 he underwent urgent device generator replacement because of early battery alert.  ([facility])   Intercurrently been diagnosed with lung Ca and finished his chemo and XRT   also underwent immunotherapy 1 year.  Oncology note reads "no concerning findings for disease recurrence...  "     Date Cr K TSH LFTs PFTs  2/18    2.0      6/18  2.0    18    8/19 1.67 3.4 2.622 13   1/20 1.83 3.8 2.37    7/20 1.71 3.7 2 13     DATE TEST EF   7/13 Echo   25 %   1/14 Echo   35 %   4/14 LHC  No obs CAD  10/18 Echo  35-40 LAE (38/1.4/36)  2/20 Echo  35-*40%    The patient denies SOB, chest pain edema or palpitations.  There has been no syncope or presyncope.   Patient denies symptoms of GI intolerance, sun sensitivity, neurological symptoms attributable to amiodarone.     Wife had a stroke and now struggling with progressive dementia.  He is caring for her and not fishing    Past Medical History:  Diagnosis Date  . Adenocarcinoma of right lung, stage 3 ([place]) 08/23/2016  . Anemia   . Arthritis    "hx right hip"  . Asthma    "when I was a child"  . Atrial fibrillation (HCC)    Amiodarone started 10/2011; Coumadin  . 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 St. Jude ICD implantation 10/03/12.  . Chronic anticoagulation   . Chronic fatigue 10/18/2016  . Chronic systolic heart failure ([place])    a. Echo 7/13: EF 25%;  b. echo  04/2012:  Mild LVH, EF 30-35%, Gr 1 DD, mild AI, mild MR, mild LAE  . COPD (chronic obstructive pulmonary disease) ([place])   . Dyslipidemia   . Gout   . History of blood transfusion 10/15/2013   "don't know where the blood's going; HgB down to 5"  . Hyperlipidemia   . Hypertension   . Hypothyroidism   . NICM (nonischemic cardiomyopathy) ([place])    [place] 4/14:  minimal CAD  . Obesity   . OSA on CPAP   . Tobacco abuse     Past Surgical History:  Procedure Laterality Date  . CARDIAC DEFIBRILLATOR PLACEMENT  2014  . CARDIOVERSION  2011  . COLONOSCOPY WITH PROPOFOL Left 10/17/2013   Procedure: COLONOSCOPY WITH PROPOFOL;  Surgeon: Inda Castle, MD;  Location: [facility];  Service: Endoscopy;  Laterality: Left;  . ESOPHAGOGASTRODUODENOSCOPY N/A 10/17/2013   Procedure: ESOPHAGOGASTRODUODENOSCOPY (EGD);  Surgeon: Inda Castle, MD;  Location: [facility];  Service: Endoscopy;  Laterality: N/A;  . ESOPHAGOGASTRODUODENOSCOPY (EGD) WITH PROPOFOL N/A 06/14/2018   Procedure: ESOPHAGOGASTRODUODENOSCOPY (EGD) WITH PROPOFOL;  Surgeon: Doran Stabler, MD;  [facility];  Service: Gastroenterology;  Laterality: N/A;  . GIVENS  CAPSULE STUDY N/A 10/29/2013   Procedure: GIVENS CAPSULE STUDY;  Surgeon: Inda Castle, MD;  [facility];  Service: Endoscopy;  Laterality: N/A;  . ICD GENERATOR CHANGEOUT N/A 02/13/2018   Procedure: ICD GENERATOR CHANGEOUT;  Surgeon: Constance Haw, MD;  Location: [facility] CV LAB;  Service: Cardiovascular;  Laterality: N/A;  . IMPLANTABLE CARDIOVERTER DEFIBRILLATOR IMPLANT Left 10/03/2012   Procedure: IMPLANTABLE CARDIOVERTER DEFIBRILLATOR IMPLANT;  Surgeon: Deboraha Sprang, MD;  Location: [facility] CATH LAB;  Service: Cardiovascular;  Laterality: Left;  . IR FLUORO GUIDE PORT INSERTION RIGHT  09/21/2016  . IR US GUIDE VASC ACCESS RIGHT  09/21/2016  . JOINT REPLACEMENT    . SAVORY DILATION N/A 06/14/2018   Procedure: SAVORY DILATION;  Surgeon: Doran Stabler, MD;  Location: [facility];  Service: Gastroenterology;  Laterality: N/A;  . TONSILLECTOMY  1950's  . TOTAL HIP ARTHROPLASTY Right 11/25/1997  . VIDEO BRONCHOSCOPY WITH ENDOBRONCHIAL ULTRASOUND N/A 08/09/2016   Procedure: VIDEO BRONCHOSCOPY WITH ENDOBRONCHIAL ULTRASOUND;  Surgeon: Javier Glazier, MD;  Location: [facility];  Service: Thoracic;  Laterality: N/A;    Current Outpatient Medications  Medication Sig Dispense Refill  . acetaminophen (TYLENOL) 500 MG tablet Take 1,000 mg by mouth every 6 (six) hours as needed for mild pain.    [name] albuterol (VENTOLIN HFA) 108 (90 Base) MCG/ACT inhaler INHALE 2 PUFFS INTO THE LUNGS EVERY 4 (FOUR) HOURS AS NEEDED FOR WHEEZING OR SHORTNESS OF BREATH 8.5 Inhaler 2  . amiodarone (PACERONE) 100 MG tablet TAKE 1 TABLET BY MOUTH EVERY DAY 90 tablet 2  . atorvastatin (LIPITOR) 20 MG tablet TAKE 1 TABLET BY MOUTH EVERY DAY 90 tablet 2  . carvedilol (COREG) 12.5 MG tablet Take 1 tablet (12.5 mg total) by mouth 2 (two) times daily. 180 tablet 3  . CVS D3 2000 units CAPS Take 2,000 Units by mouth daily.   11  . feeding supplement, ENSURE ENLIVE, (ENSURE ENLIVE) LIQD Take 237 mLs by mouth 2 (two) times daily between meals. 237 mL 12  . ferrous gluconate (FERGON) 324 MG tablet Take 1 tablet (324 mg total) by mouth 3 (three) times daily with meals. 90 tablet 7  . Fluticasone-Umeclidin-Vilant 100-62.5-25 MCG/INH AEPB TAKE 1 PUFF BY MOUTH EVERY DAY 180 each 3  . furosemide (LASIX) 40 MG tablet TAKE 1 TABLET BY MOUTH EVERY DAY 90 tablet 2  . hydrALAZINE (APRESOLINE) 100 MG tablet TAKE 1 TABLET BY MOUTH THREE TIMES A DAY 270 tablet 0  . levothyroxine (SYNTHROID) 50 MCG tablet TAKE 1 TABLET BY MOUTH DAILY BEFORE BREAKFAST 90 tablet 2  . lidocaine-prilocaine (EMLA) cream APPLY GENEROUS AMOUNT TO PORT SITE AT LEAST 1 HR PRIOR TO TREATMENT. DO NOT RUB IN. 30 g 1  . lisinopril (ZESTRIL) 40 MG tablet TAKE 1 TABLET BY MOUTH EVERY DAY 90 tablet 2  . magnesium oxide (MAG-OX) 400  (241.3 Mg) MG tablet Take 1 tablet (400 mg total) by mouth daily. 30 tablet 0  . Polyvinyl Alcohol-Povidone (REFRESH OP) Place 1 drop into both eyes every morning.    . Potassium Chloride ER 20 MEQ TBCR TAKE 1 TABLET BY MOUTH EVERY DAY 90 tablet 2  . sildenafil (REVATIO) 20 MG tablet Take as directed. Take 2-5 tablets by mouth as needed (Patient taking differently: Take 20-100 mg by mouth See admin instructions. Take as directed. Take 2-5 tablets by mouth as needed) 30 tablet 1  . spironolactone (ALDACTONE) 25 MG tablet TAKE 1 TABLET BY MOUTH EVERY DAY 90 tablet  2  . ULORIC 40 MG tablet Take 1 tablet (40 mg total) by mouth daily. 30 tablet 0  . XARELTO 20 MG TABS tablet TAKE 1 TABLET BY MOUTH EVERY DAY 30 tablet 5   No current facility-administered medications for this visit.     No Known Allergies    Review of Systems negative except from HPI and PMH  Physical Exam  BP 112/66   Pulse 69   Ht 6\' 1"  (1.854 m)   Wt 288 lb (130.6 kg)   SpO2 96%   BMI 38.00 kg/m  Well developed and Morbidly obese in no acute distress HENT normal Neck supple with JVP-flat  Clear Device pocket well healed; without hematoma or erythema.  There is no tethering  Regular rate and rhythm, no  gallop No murmur Abd-soft with active BS No Clubbing cyanosis   edema Skin-warm and dry A & Oriented  Grossly normal sensory and motor function  ECG sinus with RBBB LAD 19/18/48   Assessment and  Plan  HTN  ICD   St Jude   RV pacing threshold chronically low  CHF systolic chronic  Morbidly obese  NICM  RBBB/LAD new after 11.19  Afib-paroxysmal  Renal Insufficiency gd 3   OSA  CPAP  Lung Ca Stage 3 " CT 4/20 no recurrence   Wife with progressive dementia    No interval atrial fibrillation of which we are aware.  We will decrease his amiodarone from 1400--1000 g/week.  Anticipate next year to decrease it from 1000--700 g/week.  Euvolemic continue current meds  On Anticoagulation;  No  bleeding issues   Discussed the difficulties in caring for his wife.  Gave him a copy of OCs block  Blood pressure well controlled  We spent more than 50% of our >25 min visit in face to face counseling regarding the above

## 2018-11-27 LAB — TSH: TSH: 2.58 u[IU]/mL (ref 0.450–4.500)

## 2018-12-28 ENCOUNTER — Other Ambulatory Visit: Payer: Self-pay | Admitting: Internal Medicine

## 2019-01-13 DIAGNOSIS — R635 Abnormal weight gain: Secondary | ICD-10-CM | POA: Diagnosis not present

## 2019-01-13 DIAGNOSIS — Z6838 Body mass index (BMI) 38.0-38.9, adult: Secondary | ICD-10-CM | POA: Diagnosis not present

## 2019-01-13 DIAGNOSIS — E039 Hypothyroidism, unspecified: Secondary | ICD-10-CM | POA: Diagnosis not present

## 2019-01-13 DIAGNOSIS — Z833 Family history of diabetes mellitus: Secondary | ICD-10-CM | POA: Diagnosis not present

## 2019-01-13 DIAGNOSIS — I130 Hypertensive heart and chronic kidney disease with heart failure and stage 1 through stage 4 chronic kidney disease, or unspecified chronic kidney disease: Secondary | ICD-10-CM | POA: Diagnosis not present

## 2019-01-13 DIAGNOSIS — C3490 Malignant neoplasm of unspecified part of unspecified bronchus or lung: Secondary | ICD-10-CM | POA: Diagnosis not present

## 2019-01-21 ENCOUNTER — Ambulatory Visit (INDEPENDENT_AMBULATORY_CARE_PROVIDER_SITE_OTHER): Payer: Medicare Other

## 2019-01-21 ENCOUNTER — Ambulatory Visit (HOSPITAL_COMMUNITY): Payer: Medicare Other

## 2019-01-21 DIAGNOSIS — Z95810 Presence of automatic (implantable) cardiac defibrillator: Secondary | ICD-10-CM | POA: Diagnosis not present

## 2019-01-21 DIAGNOSIS — I428 Other cardiomyopathies: Secondary | ICD-10-CM | POA: Diagnosis not present

## 2019-01-24 ENCOUNTER — Other Ambulatory Visit: Payer: Self-pay

## 2019-01-24 ENCOUNTER — Ambulatory Visit (HOSPITAL_COMMUNITY)
Admission: RE | Admit: 2019-01-24 | Discharge: 2019-01-24 | Disposition: A | Discharge status: Home / Self Care | Payer: Medicare Other | Source: Ambulatory Visit | Attending: Internal Medicine | Admitting: Internal Medicine

## 2019-01-24 ENCOUNTER — Inpatient Hospital Stay: Payer: Medicare Other | Attending: Internal Medicine

## 2019-01-24 DIAGNOSIS — Z7901 Long term (current) use of anticoagulants: Secondary | ICD-10-CM | POA: Diagnosis not present

## 2019-01-24 DIAGNOSIS — Z9989 Dependence on other enabling machines and devices: Secondary | ICD-10-CM | POA: Diagnosis not present

## 2019-01-24 DIAGNOSIS — Z79899 Other long term (current) drug therapy: Secondary | ICD-10-CM | POA: Diagnosis not present

## 2019-01-24 DIAGNOSIS — I5022 Chronic systolic (congestive) heart failure: Secondary | ICD-10-CM | POA: Insufficient documentation

## 2019-01-24 DIAGNOSIS — E785 Hyperlipidemia, unspecified: Secondary | ICD-10-CM | POA: Insufficient documentation

## 2019-01-24 DIAGNOSIS — I4891 Unspecified atrial fibrillation: Secondary | ICD-10-CM | POA: Insufficient documentation

## 2019-01-24 DIAGNOSIS — I110 Hypertensive heart disease with heart failure: Secondary | ICD-10-CM | POA: Diagnosis not present

## 2019-01-24 DIAGNOSIS — C3411 Malignant neoplasm of upper lobe, right bronchus or lung: Secondary | ICD-10-CM | POA: Diagnosis not present

## 2019-01-24 DIAGNOSIS — M1990 Unspecified osteoarthritis, unspecified site: Secondary | ICD-10-CM | POA: Insufficient documentation

## 2019-01-24 DIAGNOSIS — C3490 Malignant neoplasm of unspecified part of unspecified bronchus or lung: Secondary | ICD-10-CM | POA: Insufficient documentation

## 2019-01-24 DIAGNOSIS — Z923 Personal history of irradiation: Secondary | ICD-10-CM | POA: Insufficient documentation

## 2019-01-24 DIAGNOSIS — E039 Hypothyroidism, unspecified: Secondary | ICD-10-CM | POA: Diagnosis not present

## 2019-01-24 DIAGNOSIS — Z9221 Personal history of antineoplastic chemotherapy: Secondary | ICD-10-CM | POA: Diagnosis not present

## 2019-01-24 DIAGNOSIS — J449 Chronic obstructive pulmonary disease, unspecified: Secondary | ICD-10-CM | POA: Insufficient documentation

## 2019-01-24 DIAGNOSIS — G4733 Obstructive sleep apnea (adult) (pediatric): Secondary | ICD-10-CM | POA: Diagnosis not present

## 2019-01-24 LAB — CMP (CANCER CENTER ONLY)
ALT: 14 U/L (ref 0–44)
AST: 16 U/L (ref 15–41)
Albumin: 3.3 g/dL — ABNORMAL LOW (ref 3.5–5.0)
Alkaline Phosphatase: 94 U/L (ref 38–126)
Anion gap: 6 (ref 5–15)
BUN: 39 mg/dL — ABNORMAL HIGH (ref 8–23)
CO2: 29 mmol/L (ref 22–32)
Calcium: 9 mg/dL (ref 8.9–10.3)
Chloride: 106 mmol/L (ref 98–111)
Creatinine: 1.83 mg/dL — ABNORMAL HIGH (ref 0.61–1.24)
GFR, Est AFR Am: 43 mL/min — ABNORMAL LOW (ref >60)
GFR, Estimated: 37 mL/min — ABNORMAL LOW (ref >60)
Glucose, Bld: 108 mg/dL — ABNORMAL HIGH (ref 70–99)
Potassium: 3.6 mmol/L (ref 3.5–5.1)
Sodium: 141 mmol/L (ref 135–145)
Total Bilirubin: 0.3 mg/dL (ref 0.3–1.2)
Total Protein: 6.4 g/dL — ABNORMAL LOW (ref 6.5–8.1)

## 2019-01-24 LAB — CBC WITH DIFFERENTIAL (CANCER CENTER ONLY)
Abs Immature Granulocytes: 0.01 10*3/uL (ref 0.00–0.07)
Basophils Absolute: 0 10*3/uL (ref 0.0–0.1)
Basophils Relative: 1 %
Eosinophils Absolute: 0.1 10*3/uL (ref 0.0–0.5)
Eosinophils Relative: 3 %
HCT: 34.2 % — ABNORMAL LOW (ref 39.0–52.0)
Hemoglobin: 10.9 g/dL — ABNORMAL LOW (ref 13.0–17.0)
Immature Granulocytes: 0 %
Lymphocytes Relative: 13 %
Lymphs Abs: 0.5 10*3/uL — ABNORMAL LOW (ref 0.7–4.0)
MCH: 30.5 pg (ref 26.0–34.0)
MCHC: 31.9 g/dL (ref 30.0–36.0)
MCV: 95.8 fL (ref 80.0–100.0)
Monocytes Absolute: 0.7 10*3/uL (ref 0.1–1.0)
Monocytes Relative: 17 %
Neutro Abs: 2.6 10*3/uL (ref 1.7–7.7)
Neutrophils Relative %: 66 %
Platelet Count: 161 10*3/uL (ref 150–400)
RBC: 3.57 MIL/uL — ABNORMAL LOW (ref 4.22–5.81)
RDW: 13.7 % (ref 11.5–15.5)
WBC Count: 3.9 10*3/uL — ABNORMAL LOW (ref 4.0–10.5)
nRBC: 0 % (ref 0.0–0.2)

## 2019-01-24 NOTE — Progress Notes (Signed)
EPIC Encounter for ICM Monitoring  Patient Name: Jason Russell is a 68 y.o. male Date: 01/24/2019 Primary Care Physican: Lucianne Lei, MD Primary Cardiologist:Ross Electrophysiologist: Caryl Comes 11/26/2018 Weight: 288 lbs   Transmission reviewed.   CorVueThoracic impedancenormal.  Prescribed:Furosemide40 mg1tablet (40 mg total)daily. Potassium 20 mEq 1 tablet daily.  Labs: 01/24/2019 Creatinine 1.83, BUN 39, Potassium 3.6, Sodium 141, GFR 37-43 10/21/2018 Creatinine 1.71, BUN 31, Potassium 3.7, Sodium 142, GFR 41-47  07/19/2018 Creatinine 1.89, BUN 29, Potassium 4.2, Sodium 142, GFR 36-42  A complete set of results can be found in results review  Recommendations: None  Follow-up plan: ICM clinic phone appointment on 02/24/2019.    Office appt 02/21/2019 with Dr. Harrington Challenger.    Copy of ICM check sent to Dr. Caryl Comes.   3 month ICM trend: 01/21/2019    1 Year ICM trend:       [name], RN 01/24/2019 3:17 PM

## 2019-01-27 ENCOUNTER — Inpatient Hospital Stay (HOSPITAL_BASED_OUTPATIENT_CLINIC_OR_DEPARTMENT_OTHER): Payer: Medicare Other | Admitting: Internal Medicine

## 2019-01-27 ENCOUNTER — Other Ambulatory Visit: Payer: Self-pay

## 2019-01-27 ENCOUNTER — Encounter: Payer: Self-pay | Admitting: Internal Medicine

## 2019-01-27 VITALS — BP 146/74 | HR 73 | Temp 98.2°F | Resp 18 | Ht 73.0 in | Wt 295.3 lb

## 2019-01-27 DIAGNOSIS — Z9221 Personal history of antineoplastic chemotherapy: Secondary | ICD-10-CM | POA: Diagnosis not present

## 2019-01-27 DIAGNOSIS — C3490 Malignant neoplasm of unspecified part of unspecified bronchus or lung: Secondary | ICD-10-CM

## 2019-01-27 DIAGNOSIS — I5022 Chronic systolic (congestive) heart failure: Secondary | ICD-10-CM | POA: Diagnosis not present

## 2019-01-27 DIAGNOSIS — I110 Hypertensive heart disease with heart failure: Secondary | ICD-10-CM | POA: Diagnosis not present

## 2019-01-27 DIAGNOSIS — C3411 Malignant neoplasm of upper lobe, right bronchus or lung: Secondary | ICD-10-CM | POA: Diagnosis not present

## 2019-01-27 DIAGNOSIS — J449 Chronic obstructive pulmonary disease, unspecified: Secondary | ICD-10-CM | POA: Diagnosis not present

## 2019-01-27 DIAGNOSIS — Z923 Personal history of irradiation: Secondary | ICD-10-CM | POA: Diagnosis not present

## 2019-01-27 NOTE — Progress Notes (Signed)
[place] [phone]   Fax:([phone]  OFFICE PROGRESS NOTE  Lucianne Lei, MD [address] [place] [address]  DIAGNOSIS: Stage IIIA (T1a, N2, M0) non-small cell lung cancer, adenocarcinoma presented with right upper lobe lung nodule in addition to mediastinal lymphadenopathy diagnosed in March 2018.  Biomarker Findings Tumor Mutational Burden - TMB-Intermediate (8 Muts/Mb) Microsatellite Status - MS-Stable Genomic Findings For a complete list of the genes assayed, please refer to the Appendix. ERBB2 amplification - equivocal? DNMT3A K461f*192 FUBP1 Q40* KEAP1 [id number] TP53 C275F 7 Disease relevant genes with no reportable alterations: EGFR, KRAS, ALK, BRAF, MET, RET, ROS1   PRIOR THERAPY:  1) Course of concurrent chemoradiation with weekly carboplatin for AUC of 2 and paclitaxel 45 MG/M2. Status post 6 cycles. Last cycle was given 10/16/2016. 2)  Consolidation treatment with immunotherapy with Imfinzi (Durvalumab) 10 MG/M2 every 2 weeks. First dose 12/21/2016.  Status post 26 cycles.  CURRENT THERAPY: Observation.  INTERVAL HISTORY: Jason [age]man[age] returns to the clinic today for follow-up visit.  The patient is feeling fine today with no concerning complaints except for shortness of breath with exertion.  He denied having any chest pain, cough or hemoptysis.  He denied having any fever or chills.  He has no nausea, vomiting, diarrhea or constipation.  He has no headache or visual changes.  He had repeat CT scan of the chest performed recently and he is here for evaluation and discussion of his scan results.   MEDICAL HISTORY: Past Medical History:  Diagnosis Date  . Adenocarcinoma of right lung, stage 3 ([facility]5/23/2018  . Anemia   . Arthritis    "hx right hip"  . Asthma    "when I was a child"  . Atrial fibrillation (HCC)    Amiodarone started 10/2011; Coumadin  . Automatic implantable  cardioverter-defibrillator in situ 10/03/2012   a. St. Jude ICD implantation 10/03/12.  . Chronic anticoagulation   . Chronic fatigue 10/18/2016  . Chronic systolic heart failure ([facility]   a. Echo 7/13: EF 25%;  b. echo 04/2012:  Mild LVH, EF 30-35%, Gr 1 DD, mild AI, mild MR, mild LAE  . COPD (chronic obstructive pulmonary disease) ([facility]  . Dyslipidemia   . Gout   . History of blood transfusion 10/15/2013   "don't know where the blood's going; HgB down to 5"  . Hyperlipidemia   . Hypertension   . Hypothyroidism   . NICM (nonischemic cardiomyopathy) ([facility]   [facility] [place]/14:  minimal CAD  . Obesity   . OSA on CPAP   . Tobacco abuse     ALLERGIES:  has No Known Allergies.  MEDICATIONS:  Current Outpatient Medications  Medication Sig Dispense Refill  . acetaminophen (TYLENOL) 500 MG tablet Take 1,000 mg by mouth every 6 (six) hours as needed for mild pain.    . Marland Kitchenlbuterol (VENTOLIN HFA) 108 (90 Base) MCG/ACT inhaler INHALE 2 PUFFS INTO THE LUNGS EVERY 4 (FOUR) HOURS AS NEEDED FOR WHEEZING OR SHORTNESS OF BREATH 8.5 Inhaler 2  . amiodarone (PACERONE) 100 MG tablet Take 1 tablet 5 days a week. 60 tablet 3  . atorvastatin (LIPITOR) 20 MG tablet TAKE 1 TABLET BY MOUTH EVERY DAY 90 tablet 2  . carvedilol (COREG) 12.5 MG tablet Take 1 tablet (12.5 mg total) by mouth 2 (two) times daily. 180 tablet 3  . CVS D3 2000 units CAPS Take 2,000 Units by mouth daily.  11  . feeding supplement, ENSURE ENLIVE, (ENSURE ENLIVE) LIQD Take 237 mLs by mouth 2 (two) times daily between meals. 237 mL 12  . ferrous gluconate (FERGON) 324 MG tablet Take 1 tablet (324 mg total) by mouth 3 (three) times daily with meals. 90 tablet 7  . Fluticasone-Umeclidin-Vilant 100-62.5-25 MCG/INH AEPB TAKE 1 PUFF BY MOUTH EVERY DAY 180 each 3  . furosemide (LASIX) 40 MG tablet TAKE 1 TABLET BY MOUTH EVERY DAY 90 tablet 2  . hydrALAZINE (APRESOLINE) 100 MG tablet TAKE 1 TABLET BY MOUTH THREE TIMES A DAY 270 tablet 0  . levothyroxine  (SYNTHROID) 50 MCG tablet TAKE 1 TABLET BY MOUTH DAILY BEFORE BREAKFAST 90 tablet 2  . lidocaine-prilocaine (EMLA) cream APPLY GENEROUS AMOUNT TO PORT SITE AT LEAST 1 HR PRIOR TO TREATMENT. DO NOT RUB IN. 30 g 1  . lisinopril (ZESTRIL) 40 MG tablet TAKE 1 TABLET BY MOUTH EVERY DAY 90 tablet 2  . magnesium oxide (MAG-OX) 400 (241.3 Mg) MG tablet Take 1 tablet (400 mg total) by mouth daily. 30 tablet 0  . Polyvinyl Alcohol-Povidone (REFRESH OP) Place 1 drop into both eyes every morning.    . Potassium Chloride ER 20 MEQ TBCR TAKE 1 TABLET BY MOUTH EVERY DAY 90 tablet 2  . sildenafil (REVATIO) 20 MG tablet Take as directed. Take 2-5 tablets by mouth as needed (Patient taking differently: Take 20-100 mg by mouth See admin instructions. Take as directed. Take 2-5 tablets by mouth as needed) 30 tablet 1  . spironolactone (ALDACTONE) 25 MG tablet TAKE 1 TABLET BY MOUTH EVERY DAY 90 tablet 2  . ULORIC 40 MG tablet Take 1 tablet (40 mg total) by mouth daily. 30 tablet 0  . XARELTO 20 MG TABS tablet TAKE 1 TABLET BY MOUTH EVERY DAY 30 tablet 5   No current facility-administered medications for this visit.     SURGICAL HISTORY:  Past Surgical History:  Procedure Laterality Date  . CARDIAC DEFIBRILLATOR PLACEMENT  2014  . CARDIOVERSION  2011  . COLONOSCOPY WITH PROPOFOL Left 10/17/2013   Procedure: COLONOSCOPY WITH PROPOFOL;  Surgeon: Inda Castle, MD;  Location: [place];  Service: Endoscopy;  Laterality: Left;  . ESOPHAGOGASTRODUODENOSCOPY N/A 10/17/2013   Procedure: ESOPHAGOGASTRODUODENOSCOPY (EGD);  Surgeon: Inda Castle, MD;  Location: [facility] [facility];  Service: Endoscopy;  Laterality: N/A;  . ESOPHAGOGASTRODUODENOSCOPY (EGD) WITH PROPOFOL N/A 06/14/2018   Procedure: ESOPHAGOGASTRODUODENOSCOPY (EGD) WITH PROPOFOL;  Surgeon: Doran Stabler, MD;  Location: [facility];  Service: Gastroenterology;  Laterality: N/A;  . GIVENS CAPSULE STUDY N/A 10/29/2013   Procedure: GIVENS CAPSULE STUDY;   Surgeon: Inda Castle, MD;  Location: [facility];  Service: Endoscopy;  Laterality: N/A;  . ICD GENERATOR CHANGEOUT N/A 02/13/2018   Procedure: ICD GENERATOR CHANGEOUT;  Surgeon: Constance Haw, MD;  Location: [place] [facility];  Service: Cardiovascular;  Laterality: N/A;  . IMPLANTABLE CARDIOVERTER DEFIBRILLATOR IMPLANT Left 10/03/2012   Procedure: IMPLANTABLE CARDIOVERTER DEFIBRILLATOR IMPLANT;  Surgeon: Deboraha Sprang, MD;  Location: [facility] CATH LAB;  Service: Cardiovascular;  Laterality: Left;  . IR FLUORO GUIDE PORT INSERTION RIGHT  09/21/2016  . IR US GUIDE VASC ACCESS RIGHT  09/21/2016  . JOINT REPLACEMENT    . SAVORY DILATION N/A 06/14/2018   Procedure: SAVORY DILATION;  Surgeon: Doran Stabler, MD;  Location: [facility];  Service: Gastroenterology;  Laterality: N/A;  . TONSILLECTOMY  1950's  . TOTAL HIP ARTHROPLASTY Right 11/25/1997  . VIDEO BRONCHOSCOPY WITH ENDOBRONCHIAL ULTRASOUND N/A  08/09/2016   Procedure: VIDEO BRONCHOSCOPY WITH ENDOBRONCHIAL ULTRASOUND;  Surgeon: Javier Glazier, MD;  [facility];  Service: Thoracic;  Laterality: N/A;    REVIEW OF SYSTEMS:  A comprehensive review of systems was negative except for: Respiratory: positive for dyspnea on exertion   PHYSICAL EXAMINATION: General appearance: alert, cooperative and no distress Head: Normocephalic, without obvious abnormality, atraumatic Neck: no adenopathy, no JVD, supple, symmetrical, trachea midline and thyroid not enlarged, symmetric, no tenderness/mass/nodules Lymph nodes: Cervical, supraclavicular, and axillary nodes normal. Resp: clear to auscultation bilaterally Back: symmetric, no curvature. ROM normal. No CVA tenderness. Cardio: regular rate and rhythm, S1, S2 normal, no murmur, click, rub or gallop GI: soft, non-tender; bowel sounds normal; no masses,  no organomegaly Extremities: extremities normal, atraumatic, no cyanosis or edema  ECOG PERFORMANCE STATUS: 1 - Symptomatic but completely  ambulatory  Blood pressure (!) 146/74, pulse 73, temperature 98.2 F (36.8 C), temperature source Temporal, resp. rate 18, height '6\' 1"'  (1.854 m), weight 295 lb 4.8 oz (133.9 kg), SpO2 99 %.  LABORATORY DATA: Lab Results  Component Value Date   WBC 3.9 (L) 01/24/2019   HGB 10.9 (L) 01/24/2019   HCT 34.2 (L) 01/24/2019   MCV 95.8 01/24/2019   PLT 161 01/24/2019      Chemistry      Component Value Date/Time   NA 141 01/24/2019 0855   NA 141 02/27/2018 0855   NA 141 03/29/2017 1038   K 3.6 01/24/2019 0855   K 4.0 03/29/2017 1038   CL 106 01/24/2019 0855   CO2 29 01/24/2019 0855   CO2 27 03/29/2017 1038   BUN 39 (H) 01/24/2019 0855   BUN 25 02/27/2018 0855   BUN 28.6 (H) 03/29/2017 1038   CREATININE 1.83 (H) 01/24/2019 0855   CREATININE 1.6 (H) 03/29/2017 1038      Component Value Date/Time   CALCIUM 9.0 01/24/2019 0855   CALCIUM 9.3 03/29/2017 1038   ALKPHOS 94 01/24/2019 0855   ALKPHOS 105 03/29/2017 1038   AST 16 01/24/2019 0855   AST 18 03/29/2017 1038   ALT 14 01/24/2019 0855   ALT 13 03/29/2017 1038   BILITOT 0.3 01/24/2019 0855   BILITOT 0.62 03/29/2017 1038       RADIOGRAPHIC STUDIES: Ct Chest Wo Contrast  Result Date: 01/24/2019 CLINICAL DATA:  Lung cancer, non-small cell history completed chemo and radiotherapy in June of 2018. EXAM: CT CHEST WITHOUT CONTRAST TECHNIQUE: Multidetector CT imaging of the chest was performed following the standard protocol without IV contrast. COMPARISON:  10/21/2018 FINDINGS: Cardiovascular: Left-sided single lead pacer defibrillator in place, lead in the right heart. Right IJ central venous access device, Port-A-Cath terminating in the distal superior vena cava. Scattered atherosclerotic changes. Normal heart size without pericardial effusion. No aneurysmal dilation. Mediastinum/Nodes: Paramediastinal radiation changes in the right chest similar to prior study. No signs of adenopathy in the chest with scattered small lymph  nodes. Thoracic inlet structures are as described and otherwise unremarkable. Lungs/Pleura: No signs of consolidation or evidence of pleural effusion. Paramediastinal radiation fibrosis similar to prior study. Branching opacity with some surrounding ground-glass right lower lobe superior segment (images 52-56, series 5 are new since the previous exam. Upper Abdomen: Incidentally imaged contents the upper abdomen show no acute abnormality. 7 mm right interpolar calculus and a 4 mm right upper pole calculus with moderate to marked renal cortical scarring bilaterally. Small vascular calcification in the lower pole left kidney interpolar cyst on the left. Abdominal atherosclerosis. Musculoskeletal: No chest  wall mass or suspicious bone lesions identified. IMPRESSION: 1. Branching opacity with some surrounding ground-glass in the superior segment of the right lower lobe is new since the previous exam. This could represent an infectious or inflammatory process. Recommend attention on follow-up. 2. Paramediastinal radiation changes in the right chest, no signs of disease recurrence. 3. Renal calculi as above. Aortic Atherosclerosis (ICD10-I70.0). Electronically Signed   By: Zetta Bills M.D.   On: 01/24/2019 10:35    ASSESSMENT AND PLAN:  This is a very pleasant 68 years old African-American male with stage IIIA non-small cell lung cancer, adenocarcinoma.  The patient completed 6 weeks of concurrent chemoradiation with weekly carboplatin and paclitaxel and tolerated his treatment well except for odynophagia. The patient was then started on consolidation treatment with immunotherapy with Imfinzi (Durvalumab) status post 26 cycles.  The patient tolerated his previous treatment well. He is currently on observation and feeling fine. He had repeat CT scan of the chest performed recently.  I personally and independently reviewed the scans and discussed the results with the patient today. His scan showed no concerning  findings for disease progression. I recommended for him to continue on observation with repeat CT scan of the chest in 4 months. He was advised to call immediately if he has any concerning symptoms in the interval. The patient voices understanding of current disease status and treatment options and is in agreement with the current care plan. All questions were answered. The patient knows to call the clinic with any problems, questions or concerns. We can certainly see the patient much sooner if necessary. Disclaimer: This note was dictated with voice recognition software. Similar sounding words can inadvertently be transcribed and may not be corrected upon review.

## 2019-01-28 ENCOUNTER — Telehealth: Payer: Self-pay | Admitting: Internal Medicine

## 2019-01-28 NOTE — Telephone Encounter (Signed)
Scheduled per los. Called and left msg. Mailed printout  °

## 2019-02-12 ENCOUNTER — Other Ambulatory Visit: Payer: Self-pay | Admitting: Internal Medicine

## 2019-02-12 NOTE — Telephone Encounter (Signed)
Xarelto 20mg  refill request received. Pt is 68 years old, weight-133.9kg, Crea-1.83 on 01/24/2019, last seen by Dr. Caryl Comes on 11/26/2018, Diagnosis-Afib, CrCl-74.52ml/min; Dose is appropriate based on dosing criteria. Will send in refill to requested pharmacy.

## 2019-02-20 NOTE — Progress Notes (Signed)
Cardiology Office Note   Date:  02/21/2019   ID:  Jason Russell, DOB [birth year], MRN [id number]  PCP:  Lucianne Lei, MD  Cardiologist:   Dorris Carnes, MD   F/U of systolic CHF and atrial fibrillation     History of Present Illness:  Jason Russell is a 68 y.o. male with hx of nonobstructive CAD (cath 2014), HTN, OSA, PAF, chronic systolic CHF  Last echo in Feb 2020 LVEF 35 to 40%    He is also followed by [name]   Since seen the pt says his breathing is OK   He denies CP No palpitations        Current Meds  Medication Sig  . acetaminophen (TYLENOL) 500 MG tablet Take 1,000 mg by mouth every 6 (six) hours as needed for mild pain.  [name] albuterol (VENTOLIN HFA) 108 (90 Base) MCG/ACT inhaler INHALE 2 PUFFS INTO THE LUNGS EVERY 4 (FOUR) HOURS AS NEEDED FOR WHEEZING OR SHORTNESS OF BREATH  . amiodarone (PACERONE) 100 MG tablet Take 1 tablet 5 days a week.  [name] atorvastatin (LIPITOR) 20 MG tablet TAKE 1 TABLET BY MOUTH EVERY DAY  . carvedilol (COREG) 12.5 MG tablet Take 1 tablet (12.5 mg total) by mouth 2 (two) times daily.  . CVS D3 2000 units CAPS Take 2,000 Units by mouth daily.   . feeding supplement, ENSURE ENLIVE, (ENSURE ENLIVE) LIQD Take 237 mLs by mouth 2 (two) times daily between meals.  . ferrous gluconate (FERGON) 324 MG tablet Take 1 tablet (324 mg total) by mouth 3 (three) times daily with meals.  . Fluticasone-Umeclidin-Vilant 100-62.5-25 MCG/INH AEPB TAKE 1 PUFF BY MOUTH EVERY DAY  . furosemide (LASIX) 40 MG tablet TAKE 1 TABLET BY MOUTH EVERY DAY  . hydrALAZINE (APRESOLINE) 100 MG tablet TAKE 1 TABLET BY MOUTH THREE TIMES A DAY  . levothyroxine (SYNTHROID) 50 MCG tablet TAKE 1 TABLET BY MOUTH DAILY BEFORE BREAKFAST  . lidocaine-prilocaine (EMLA) cream APPLY GENEROUS AMOUNT TO PORT SITE AT LEAST 1 HR PRIOR TO TREATMENT. DO NOT RUB IN.  [name] lisinopril (ZESTRIL) 40 MG tablet TAKE 1 TABLET BY MOUTH EVERY DAY  . magnesium oxide (MAG-OX) 400 (241.3 Mg) MG tablet Take 1  tablet (400 mg total) by mouth daily.  . Polyvinyl Alcohol-Povidone (REFRESH OP) Place 1 drop into both eyes every morning.  . Potassium Chloride ER 20 MEQ TBCR TAKE 1 TABLET BY MOUTH EVERY DAY  . sildenafil (REVATIO) 20 MG tablet Take as directed. Take 2-5 tablets by mouth as needed (Patient taking differently: Take 20-100 mg by mouth See admin instructions. Take as directed. Take 2-5 tablets by mouth as needed)  . spironolactone (ALDACTONE) 25 MG tablet TAKE 1 TABLET BY MOUTH EVERY DAY  . ULORIC 40 MG tablet Take 1 tablet (40 mg total) by mouth daily.  [name] 20 MG TABS tablet TAKE 1 TABLET BY MOUTH EVERY DAY     Allergies:   Patient has no known allergies.   Past Medical History:  Diagnosis Date  . Adenocarcinoma of right lung, stage 3 ([place]) 08/23/2016  . Anemia   . Arthritis    "hx right hip"  . Asthma    "when I was a child"  . Atrial fibrillation (HCC)    Amiodarone started 10/2011; Coumadin  . 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 St. Jude ICD implantation 10/03/12.  . Chronic anticoagulation   . Chronic fatigue 10/18/2016  . Chronic systolic heart failure (HCC)    a. Echo  7/13: EF 25%;  b. echo 04/2012:  Mild LVH, EF 30-35%, Gr 1 DD, mild AI, mild MR, mild LAE  . COPD (chronic obstructive pulmonary disease) ([place])   . Dyslipidemia   . Gout   . History of blood transfusion 10/15/2013   "don't know where the blood's going; HgB down to 5"  . Hyperlipidemia   . Hypertension   . Hypothyroidism   . NICM (nonischemic cardiomyopathy) ([place])    [name] 4/14:  minimal CAD  . Obesity   . OSA on CPAP   . Tobacco abuse     Past Surgical History:  Procedure Laterality Date  . CARDIAC DEFIBRILLATOR PLACEMENT  2014  . CARDIOVERSION  2011  . COLONOSCOPY WITH PROPOFOL Left 10/17/2013   Procedure: COLONOSCOPY WITH PROPOFOL;  Surgeon: Inda Castle, MD;  Location: [place];  Service: Endoscopy;  Laterality: Left;  . ESOPHAGOGASTRODUODENOSCOPY N/A  10/17/2013   Procedure: ESOPHAGOGASTRODUODENOSCOPY (EGD);  Surgeon: Inda Castle, MD;  Location: [place];  Service: Endoscopy;  Laterality: N/A;  . ESOPHAGOGASTRODUODENOSCOPY (EGD) WITH PROPOFOL N/A 06/14/2018   Procedure: ESOPHAGOGASTRODUODENOSCOPY (EGD) WITH PROPOFOL;  Surgeon: Doran Stabler, MD;  Location: [facility];  Service: Gastroenterology;  Laterality: N/A;  . GIVENS CAPSULE STUDY N/A 10/29/2013   Procedure: GIVENS CAPSULE STUDY;  Surgeon: Inda Castle, MD;  Location: [facility];  Service: Endoscopy;  Laterality: N/A;  . ICD GENERATOR CHANGEOUT N/A 02/13/2018   Procedure: ICD GENERATOR CHANGEOUT;  Surgeon: Constance Haw, MD;  Location: [place] CV LAB;  Service: Cardiovascular;  Laterality: N/A;  . IMPLANTABLE CARDIOVERTER DEFIBRILLATOR IMPLANT Left 10/03/2012   Procedure: IMPLANTABLE CARDIOVERTER DEFIBRILLATOR IMPLANT;  Surgeon: Deboraha Sprang, MD;  Location: [facility] CATH LAB;  Service: Cardiovascular;  Laterality: Left;  . IR FLUORO GUIDE PORT INSERTION RIGHT  09/21/2016  . IR US GUIDE VASC ACCESS RIGHT  09/21/2016  . JOINT REPLACEMENT    . SAVORY DILATION N/A 06/14/2018   Procedure: SAVORY DILATION;  Surgeon: Doran Stabler, MD;  Location: [facility];  Service: Gastroenterology;  Laterality: N/A;  . TONSILLECTOMY  1950's  . TOTAL HIP ARTHROPLASTY Right 11/25/1997  . VIDEO BRONCHOSCOPY WITH ENDOBRONCHIAL ULTRASOUND N/A 08/09/2016   Procedure: VIDEO BRONCHOSCOPY WITH ENDOBRONCHIAL ULTRASOUND;  Surgeon: Javier Glazier, MD;  Location: [place];  Service: Thoracic;  Laterality: N/A;     Social History:  The patient  reports that he quit smoking about 2 years ago. His smoking use included cigarettes. He has a 11.25 pack-year smoking history. He has never used smokeless tobacco. He reports current alcohol use of about 1.0 standard drinks of alcohol per week. He reports that he does not use drugs.   Family History:  The patient's family history includes Heart Problems  in his mother; Hypertension in his mother; Lung cancer in his brother; Other in his father.    ROS:  Please see the history of present illness. All other systems are reviewed and  Negative to the above problem except as noted.    PHYSICAL EXAM: VS:  BP 134/78   Pulse 71   Ht 6\' 1"  (1.854 m)   Wt 287 lb (130.2 kg)   SpO2 98%   BMI 37.87 kg/m   GEN: Morbidly obese 68 yo  in no acute distress  HEENT: normal  Neck: no JVD, carotid bruits,  Cardiac: RRR; no murmurs, rubs, or gallops,no edema  Respiratory:  clear to auscultation bilaterally, normal work of breathing GI: soft, nontender, nondistended, + BS  No hepatomegaly  MS: no deformity Moving all extremities   Skin: warm and dry, no rash Neuro:  Strength and sensation are intact Psych: euthymic mood, full affect   EKG:  EKG is not ordered today.   Lipid Panel    Component Value Date/Time   CHOL 150 12/23/2012 1205   TRIG 73.0 12/23/2012 1205   HDL 46.60 12/23/2012 1205   CHOLHDL 3 12/23/2012 1205   VLDL 14.6 12/23/2012 1205   LDLCALC 89 12/23/2012 1205      Wt Readings from Last 3 Encounters:  02/21/19 287 lb (130.2 kg)  01/27/19 295 lb 4.8 oz (133.9 kg)  11/26/18 288 lb (130.6 kg)      ASSESSMENT AND PLAN:  1  Chronic systolic CHF    VOlume status looks OK    I would recomm taking him off of lisinopril and starting Entresto  Pt will check with insurance  2  CAD   Nonobstructive at chat   Follow   3    Atrial fibrillation   Continue low dose amio   Clinically in SR  4   HTN   BP is OK, a little increased at times    Pt will check on Entresto       Current medicines are reviewed at length with the patient today.  The patient does not have concerns regarding medicines.  Signed, Dorris Carnes, MD  02/21/2019 4:22 PM    [facility] [place], [place], [place]  [address] Phone: [phone]; Fax: [phone]

## 2019-02-21 ENCOUNTER — Other Ambulatory Visit: Payer: Self-pay

## 2019-02-21 ENCOUNTER — Ambulatory Visit (INDEPENDENT_AMBULATORY_CARE_PROVIDER_SITE_OTHER): Payer: Medicare Other | Admitting: Internal Medicine

## 2019-02-21 ENCOUNTER — Encounter: Payer: Self-pay | Admitting: Internal Medicine

## 2019-02-21 VITALS — BP 134/78 | HR 71 | Ht 73.0 in | Wt 287.0 lb

## 2019-02-21 DIAGNOSIS — I480 Paroxysmal atrial fibrillation: Secondary | ICD-10-CM

## 2019-02-21 DIAGNOSIS — I10 Essential (primary) hypertension: Secondary | ICD-10-CM

## 2019-02-21 DIAGNOSIS — I5022 Chronic systolic (congestive) heart failure: Secondary | ICD-10-CM

## 2019-02-21 NOTE — Patient Instructions (Signed)
Medication Instructions:  No changes *If you need a refill on your cardiac medications before your next appointment, please call your pharmacy*  Lab Work: none If you have labs (blood work) drawn today and your tests are completely normal, you will receive your results only by: [name] MyChart Message (if you have MyChart) OR . A paper copy in the mail If you have any lab test that is abnormal or we need to change your treatment, we will call you to review the results.  Testing/Procedures: none  Follow-Up: At [facility], you and your health needs are our priority.  As part of our continuing mission to provide you with exceptional heart care, we have created designated Provider Care Teams.  These Care Teams include your primary Cardiologist (physician) and Advanced Practice Providers (APPs -  Physician Assistants and Nurse Practitioners) who all work together to provide you with the care you need, when you need it.  Your next appointment:   8 month(s)  The format for your next appointment:   In Person  Provider:   Dorris Carnes, MD  Other Instructions Please call insurance company and find out cost of Entresto (sacubitril/valsartan) 24/26 mg one tablet twice a day.

## 2019-02-24 NOTE — Telephone Encounter (Signed)
Good coveage with Entresto On 40 lisinopril  Stop     4 days later take 24/26 Entresto  Come in in 2 wks for BP check and BMET

## 2019-02-25 ENCOUNTER — Telehealth: Payer: Self-pay | Admitting: *Deleted

## 2019-02-25 DIAGNOSIS — I5022 Chronic systolic (congestive) heart failure: Secondary | ICD-10-CM

## 2019-02-25 MED ORDER — ENTRESTO 24-26 MG PO TABS: 1.0000 | ORAL_TABLET | Freq: Two times a day (BID) | ORAL | 6 refills | Status: DC

## 2019-02-25 NOTE — Telephone Encounter (Signed)
Message from Dr. Harrington Challenger: Jason Russell coveage with Jason Russell On 40 lisinopril Stop    4 days later take 24/26 Entresto  Come in in 2 wks for BP check and BMET __________________________________________________________________  Jason Russell with patient. He will not take anymore lisinopril and will start Entresto 24/26 mg on 03/01/19. Scheduled to return for BMET and 03/14/19.  He will check BP at home and bring list of readings to the lab appointment. Pt verbalizes understanding of this plan.  Knows to call if develops SOB or dizziness.

## 2019-02-26 ENCOUNTER — Telehealth: Payer: Self-pay

## 2019-02-26 NOTE — Telephone Encounter (Signed)
Left message for patient to remind of missed remote transmission.  

## 2019-02-26 NOTE — Progress Notes (Signed)
No ICM remote transmission received for 02/24/2019 and next ICM transmission scheduled for 03/17/2019.

## 2019-03-13 DIAGNOSIS — J449 Chronic obstructive pulmonary disease, unspecified: Secondary | ICD-10-CM | POA: Diagnosis not present

## 2019-03-13 DIAGNOSIS — Z950 Presence of cardiac pacemaker: Secondary | ICD-10-CM | POA: Diagnosis not present

## 2019-03-13 DIAGNOSIS — I10 Essential (primary) hypertension: Secondary | ICD-10-CM | POA: Diagnosis not present

## 2019-03-13 DIAGNOSIS — I498 Other specified cardiac arrhythmias: Secondary | ICD-10-CM | POA: Diagnosis not present

## 2019-03-13 DIAGNOSIS — I429 Cardiomyopathy, unspecified: Secondary | ICD-10-CM | POA: Diagnosis not present

## 2019-03-14 ENCOUNTER — Other Ambulatory Visit: Payer: Medicare Other | Admitting: *Deleted

## 2019-03-14 ENCOUNTER — Encounter: Payer: Self-pay | Admitting: *Deleted

## 2019-03-14 ENCOUNTER — Other Ambulatory Visit: Payer: Self-pay

## 2019-03-14 DIAGNOSIS — I5022 Chronic systolic (congestive) heart failure: Secondary | ICD-10-CM

## 2019-03-14 LAB — BASIC METABOLIC PANEL
BUN/Creatinine Ratio: 16 (ref 10–24)
BUN: 28 mg/dL — ABNORMAL HIGH (ref 8–27)
CO2: 23 mmol/L (ref 20–29)
Calcium: 8.9 mg/dL (ref 8.6–10.2)
Chloride: 106 mmol/L (ref 96–106)
Creatinine, Ser: 1.71 mg/dL — ABNORMAL HIGH (ref 0.76–1.27)
GFR calc Af Amer: 47 mL/min/{1.73_m2} — ABNORMAL LOW (ref >59)
GFR calc non Af Amer: 41 mL/min/{1.73_m2} — ABNORMAL LOW (ref >59)
Glucose: 102 mg/dL — ABNORMAL HIGH (ref 65–99)
Potassium: 3.5 mmol/L (ref 3.5–5.2)
Sodium: 142 mmol/L (ref 134–144)

## 2019-03-14 NOTE — Progress Notes (Signed)
List of BP readings received from patient when he came to lab today.  To Dr. Harrington Challenger for review/new recommendations.

## 2019-03-14 NOTE — Progress Notes (Signed)
Note to increase entresto to 49/51   Continue to follow BP

## 2019-03-17 ENCOUNTER — Ambulatory Visit (INDEPENDENT_AMBULATORY_CARE_PROVIDER_SITE_OTHER): Payer: Medicare Other

## 2019-03-17 DIAGNOSIS — I5022 Chronic systolic (congestive) heart failure: Secondary | ICD-10-CM | POA: Diagnosis not present

## 2019-03-17 DIAGNOSIS — Z95810 Presence of automatic (implantable) cardiac defibrillator: Secondary | ICD-10-CM | POA: Diagnosis not present

## 2019-03-18 ENCOUNTER — Telehealth: Payer: Self-pay | Admitting: *Deleted

## 2019-03-18 MED ORDER — SACUBITRIL-VALSARTAN 49-51 MG PO TABS: 1.0000 | ORAL_TABLET | Freq: Two times a day (BID) | ORAL | 5 refills | Status: DC

## 2019-03-18 NOTE — Telephone Encounter (Signed)
-----   Message from Dorris Carnes V, MD sent at 03/14/2019  4:48 PM EST ----- Kidney function is stable BP is elevated on this dose of Entresto   I would recomm increasing to 49/51 mg    F/U BP readings in 3 wks   Call in / write in

## 2019-03-18 NOTE — Telephone Encounter (Signed)
Spoke with patient, he will increase Entresto to 49/51 mg twice a day and continue to monitor BP.  Will write via MyChart or call in BPs.  Originally in Nov he was told by insurance med would cost $60 for 90 day supply.  Now it is costing $130 for ONE month.   I asked him to check back with them as it may be due to he is in donut hole for the year or this may be a deductible he needs to meet and then the price could drop back down. He will call them and let us know.  Thinks he makes too much on pension to qualify for patient assistance but would appreciate any help in this matter.

## 2019-03-19 ENCOUNTER — Other Ambulatory Visit: Payer: Self-pay | Admitting: Internal Medicine

## 2019-03-19 MED ORDER — SACUBITRIL-VALSARTAN 49-51 MG PO TABS: 1.0000 | ORAL_TABLET | Freq: Two times a day (BID) | ORAL | 1 refills | Status: DC

## 2019-03-19 NOTE — Telephone Encounter (Signed)
Called pt back and he has been made aware that I have sent in 90 day refill for Entresto to [facility]. Pt very grateful for the call back.

## 2019-03-19 NOTE — Progress Notes (Signed)
EPIC Encounter for ICM Monitoring  Patient Name: Adnan Vanvoorhis is a 68 y.o. male Date: 03/19/2019 Primary Care Physican: Lucianne Lei, MD Primary Cardiologist:Ross Electrophysiologist: Caryl Comes 03/19/2019 Weight: 282 lbs   Spoke with patient and he is asymptomatic for fluid accumulation.  CorVueThoracic impedancenormal.  Prescribed:Furosemide40 mg1tablet (40 mg total)daily. Potassium 20 mEq 1 tablet daily.  Labs: 01/24/2019 Creatinine 1.83, BUN 39, Potassium 3.6, Sodium 141, GFR 37-43 10/21/2018 Creatinine 1.71, BUN 31, Potassium 3.7, Sodium 142, GFR 41-47  07/19/2018 Creatinine 1.89, BUN 29, Potassium 4.2, Sodium 142, GFR 36-42  A complete set of results can be found in results review  Recommendations: No changes and encouraged to call if experiencing any fluid symptoms.  Follow-up plan: ICM clinic phone appointment on 04/22/2019.   91 day device clinic remote transmission 04/21/2019.   Copy of ICM check sent to Dr. Caryl Comes.   3 month ICM trend: 03/17/2019    1 Year ICM trend:       [name], RN 03/19/2019 8:35 AM

## 2019-03-19 NOTE — Telephone Encounter (Signed)
*  STAT* If patient is at the pharmacy, call can be transferred to refill team.   1. Which medications need to be refilled? (please list name of each medication and dose if known) sacubitril-valsartan (ENTRESTO) 49-51 MG  2. Which pharmacy/location (including street and city if local pharmacy) is medication to be sent to? [id number] - [place], [place] - [address]  3. Do they need a 30 day or 90 day supply? 90 days   Patient states he had a refill sent for 30 days but would like a new prescription for 90 days, because it is cheaper.

## 2019-03-24 NOTE — Telephone Encounter (Signed)
**Note De-Identified  Obfuscation** The pt states that all he needed was for [place] to send in a 90 day supply RX for his Delene Loll to his pharmacy to get a better price which is $60/90 day supply vs $130/30 day supply which he states we have already done and is ready for him to pick up now.  He states that he is ok with paying $60 for a 90 day supply of his Delene Loll and that he has spoken to his ins provider and is aware this is the lowest cost for him on his plan.  He thanked me for calling to check on him.

## 2019-04-08 ENCOUNTER — Other Ambulatory Visit: Payer: Self-pay | Admitting: Adult Health

## 2019-04-16 ENCOUNTER — Other Ambulatory Visit: Payer: Self-pay | Admitting: Internal Medicine

## 2019-04-16 MED ORDER — FERROUS GLUCONATE 324 (38 FE) MG PO TABS: 324.0000 mg | ORAL_TABLET | Freq: Three times a day (TID) | ORAL | 7 refills | Status: DC

## 2019-04-21 ENCOUNTER — Ambulatory Visit (INDEPENDENT_AMBULATORY_CARE_PROVIDER_SITE_OTHER): Payer: Medicare Other | Admitting: *Deleted

## 2019-04-21 DIAGNOSIS — I5022 Chronic systolic (congestive) heart failure: Secondary | ICD-10-CM | POA: Diagnosis not present

## 2019-04-22 ENCOUNTER — Ambulatory Visit (INDEPENDENT_AMBULATORY_CARE_PROVIDER_SITE_OTHER): Payer: Medicare Other

## 2019-04-22 DIAGNOSIS — Z95810 Presence of automatic (implantable) cardiac defibrillator: Secondary | ICD-10-CM | POA: Diagnosis not present

## 2019-04-22 DIAGNOSIS — I5022 Chronic systolic (congestive) heart failure: Secondary | ICD-10-CM | POA: Diagnosis not present

## 2019-04-22 LAB — CUP PACEART REMOTE DEVICE CHECK
Battery Remaining Longevity: 92 mo
Battery Remaining Percentage: 86 %
Battery Voltage: 3.07 V
Brady Statistic RV Percent Paced: 1 %
Date Time Interrogation Session: 20210119104127
HighPow Impedance: 39 Ohm
HighPow Impedance: 39 Ohm
Implantable Lead Implant Date: 20140703
Implantable Lead Location: 753860
Implantable Pulse Generator Implant Date: 20191113
Lead Channel Impedance Value: 430 Ohm
Lead Channel Pacing Threshold Amplitude: 0.5 V
Lead Channel Pacing Threshold Pulse Width: 0.5 ms
Lead Channel Sensing Intrinsic Amplitude: 5.5 mV
Lead Channel Setting Pacing Amplitude: 2.5 V
Lead Channel Setting Pacing Pulse Width: 0.5 ms
Lead Channel Setting Sensing Sensitivity: 0.5 mV
Pulse Gen Serial Number: 9820959

## 2019-04-25 NOTE — Progress Notes (Signed)
EPIC Encounter for ICM Monitoring  Patient Name: Jason Russell is a 69 y.o. male Date: 04/25/2019 Primary Care Physican: Lucianne Lei, MD Primary Cardiologist:Ross Electrophysiologist: Caryl Comes 04/25/2019 Weight: 282lbs   Spoke with patient and he is asymptomatic for fluid accumulation.  CorVueThoracic impedancenormal.  Prescribed:Furosemide40 mg1tablet (40 mg total)daily. Potassium 20 mEq 1 tablet daily.  Labs: 01/24/2019 Collene Mares, [id number], [id number] 10/21/2018 Creatinine1.71, BUN31, Potassium3.7, [id number], FPO25-18  07/19/2018 Creatinine1.89, BUN29, Potassium4.2, [id number], [id number] A complete set of results can be found in results review  Recommendations: No changes and encouraged to call if experiencing any fluid symptoms.  Follow-up plan: ICM clinic phone appointment on 05/26/2019.   91 day device clinic remote transmission 07/21/2019.   Copy of ICM check sent to Dr. Caryl Comes.   3 month ICM trend: 04/25/2019   1 Year ICM trend:       [name], RN 04/25/2019 3:25 PM

## 2019-05-04 ENCOUNTER — Other Ambulatory Visit: Payer: Self-pay | Admitting: Internal Medicine

## 2019-05-16 DIAGNOSIS — D649 Anemia, unspecified: Secondary | ICD-10-CM | POA: Diagnosis not present

## 2019-05-16 DIAGNOSIS — I10 Essential (primary) hypertension: Secondary | ICD-10-CM | POA: Diagnosis not present

## 2019-05-16 DIAGNOSIS — M940 Chondrocostal junction syndrome [Tietze]: Secondary | ICD-10-CM | POA: Diagnosis not present

## 2019-05-16 DIAGNOSIS — E340 Carcinoid syndrome: Secondary | ICD-10-CM | POA: Diagnosis not present

## 2019-05-16 DIAGNOSIS — I272 Pulmonary hypertension, unspecified: Secondary | ICD-10-CM | POA: Diagnosis not present

## 2019-05-16 DIAGNOSIS — E040 Nontoxic diffuse goiter: Secondary | ICD-10-CM | POA: Diagnosis not present

## 2019-05-16 DIAGNOSIS — C3490 Malignant neoplasm of unspecified part of unspecified bronchus or lung: Secondary | ICD-10-CM | POA: Diagnosis not present

## 2019-05-26 ENCOUNTER — Ambulatory Visit (INDEPENDENT_AMBULATORY_CARE_PROVIDER_SITE_OTHER): Payer: Medicare Other

## 2019-05-26 DIAGNOSIS — Z95810 Presence of automatic (implantable) cardiac defibrillator: Secondary | ICD-10-CM

## 2019-05-26 DIAGNOSIS — I5022 Chronic systolic (congestive) heart failure: Secondary | ICD-10-CM

## 2019-05-27 ENCOUNTER — Telehealth: Payer: Self-pay | Admitting: Internal Medicine

## 2019-05-27 NOTE — Telephone Encounter (Signed)
Rescheduled per 2/22 sch msg, pt req. Called and spoke with pt, confirmed 3/8 appt

## 2019-05-29 DIAGNOSIS — I130 Hypertensive heart and chronic kidney disease with heart failure and stage 1 through stage 4 chronic kidney disease, or unspecified chronic kidney disease: Secondary | ICD-10-CM | POA: Diagnosis not present

## 2019-05-29 DIAGNOSIS — C3490 Malignant neoplasm of unspecified part of unspecified bronchus or lung: Secondary | ICD-10-CM | POA: Diagnosis not present

## 2019-05-29 DIAGNOSIS — D649 Anemia, unspecified: Secondary | ICD-10-CM | POA: Diagnosis not present

## 2019-05-30 ENCOUNTER — Ambulatory Visit (HOSPITAL_COMMUNITY)
Admission: RE | Admit: 2019-05-30 | Discharge: 2019-05-30 | Disposition: A | Discharge status: Home / Self Care | Payer: Medicare Other | Source: Ambulatory Visit | Attending: Internal Medicine | Admitting: Internal Medicine

## 2019-05-30 ENCOUNTER — Encounter (HOSPITAL_COMMUNITY): Payer: Self-pay

## 2019-05-30 ENCOUNTER — Other Ambulatory Visit: Payer: Self-pay

## 2019-05-30 ENCOUNTER — Inpatient Hospital Stay: Payer: Medicare Other | Attending: Internal Medicine

## 2019-05-30 DIAGNOSIS — Z9221 Personal history of antineoplastic chemotherapy: Secondary | ICD-10-CM | POA: Insufficient documentation

## 2019-05-30 DIAGNOSIS — C3490 Malignant neoplasm of unspecified part of unspecified bronchus or lung: Secondary | ICD-10-CM

## 2019-05-30 DIAGNOSIS — Z923 Personal history of irradiation: Secondary | ICD-10-CM | POA: Insufficient documentation

## 2019-05-30 DIAGNOSIS — C3411 Malignant neoplasm of upper lobe, right bronchus or lung: Secondary | ICD-10-CM | POA: Insufficient documentation

## 2019-05-30 LAB — CBC WITH DIFFERENTIAL (CANCER CENTER ONLY)
Abs Immature Granulocytes: 0.01 10*3/uL (ref 0.00–0.07)
Basophils Absolute: 0 10*3/uL (ref 0.0–0.1)
Basophils Relative: 1 %
Eosinophils Absolute: 0.1 10*3/uL (ref 0.0–0.5)
Eosinophils Relative: 3 %
HCT: 39.9 % (ref 39.0–52.0)
Hemoglobin: 12.8 g/dL — ABNORMAL LOW (ref 13.0–17.0)
Immature Granulocytes: 0 %
Lymphocytes Relative: 12 %
Lymphs Abs: 0.5 10*3/uL — ABNORMAL LOW (ref 0.7–4.0)
MCH: 29.8 pg (ref 26.0–34.0)
MCHC: 32.1 g/dL (ref 30.0–36.0)
MCV: 92.8 fL (ref 80.0–100.0)
Monocytes Absolute: 0.8 10*3/uL (ref 0.1–1.0)
Monocytes Relative: 18 %
Neutro Abs: 2.8 10*3/uL (ref 1.7–7.7)
Neutrophils Relative %: 66 %
Platelet Count: 160 10*3/uL (ref 150–400)
RBC: 4.3 MIL/uL (ref 4.22–5.81)
RDW: 14.4 % (ref 11.5–15.5)
WBC Count: 4.3 10*3/uL (ref 4.0–10.5)
nRBC: 0 % (ref 0.0–0.2)

## 2019-05-30 LAB — CMP (CANCER CENTER ONLY)
ALT: 13 U/L (ref 0–44)
AST: 17 U/L (ref 15–41)
Albumin: 3.7 g/dL (ref 3.5–5.0)
Alkaline Phosphatase: 111 U/L (ref 38–126)
Anion gap: 7 (ref 5–15)
BUN: 24 mg/dL — ABNORMAL HIGH (ref 8–23)
CO2: 30 mmol/L (ref 22–32)
Calcium: 9.1 mg/dL (ref 8.9–10.3)
Chloride: 106 mmol/L (ref 98–111)
Creatinine: 1.89 mg/dL — ABNORMAL HIGH (ref 0.61–1.24)
GFR, Est AFR Am: 42 mL/min — ABNORMAL LOW (ref >60)
GFR, Estimated: 36 mL/min — ABNORMAL LOW (ref >60)
Glucose, Bld: 105 mg/dL — ABNORMAL HIGH (ref 70–99)
Potassium: 3.8 mmol/L (ref 3.5–5.1)
Sodium: 143 mmol/L (ref 135–145)
Total Bilirubin: 0.4 mg/dL (ref 0.3–1.2)
Total Protein: 7.2 g/dL (ref 6.5–8.1)

## 2019-05-30 NOTE — Progress Notes (Signed)
EPIC Encounter for ICM Monitoring  Patient Name: Jason Russell is a 69 y.o. male Date: 05/30/2019 Primary Care Physican: Lucianne Lei, MD Primary Cardiologist:Ross Electrophysiologist: Caryl Comes 05/30/2019 Weight: 282lbs   Spoke with patient and he is asymptomatic for fluid accumulation.  He is unsure what may have cause possible fluid accumulation.  Discussed limiting salt and fluid intake.  CorVueThoracic impedancenormal but was suggesting possible fluid accumulation from 2/4 - 2/15.  Prescribed:Furosemide40 mg1tablet (40 mg total)daily. Potassium 20 mEq 1 tablet daily.  Labs: 01/24/2019 Collene Mares, [id number], OHY07-37 10/21/2018 Creatinine1.71, BUN31, Potassium3.7, [id number], IOE70-35  07/19/2018 Creatinine1.89, BUN29, Potassium4.2, [id number], [id number] A complete set of results can be found in results review  Recommendations:No changes and encouraged to call if experiencing any fluid symptoms.  Follow-up plan: ICM clinic phone appointment on3/29/2021. 91 day device clinic remote transmission 07/21/2019.   Copy of ICM check sent to [place].   3 month ICM trend: 05/26/2019    1 Year ICM trend:       [name], RN 05/30/2019 11:02 AM

## 2019-06-02 ENCOUNTER — Ambulatory Visit: Payer: Medicare Other | Admitting: Internal Medicine

## 2019-06-09 ENCOUNTER — Encounter: Payer: Self-pay | Admitting: Internal Medicine

## 2019-06-09 ENCOUNTER — Other Ambulatory Visit: Payer: Self-pay

## 2019-06-09 ENCOUNTER — Inpatient Hospital Stay: Payer: Medicare Other | Attending: Internal Medicine | Admitting: Internal Medicine

## 2019-06-09 VITALS — BP 145/68 | HR 64 | Temp 98.0°F | Resp 18 | Ht 73.0 in | Wt 305.0 lb

## 2019-06-09 DIAGNOSIS — I4891 Unspecified atrial fibrillation: Secondary | ICD-10-CM | POA: Insufficient documentation

## 2019-06-09 DIAGNOSIS — Z7901 Long term (current) use of anticoagulants: Secondary | ICD-10-CM | POA: Diagnosis not present

## 2019-06-09 DIAGNOSIS — M1990 Unspecified osteoarthritis, unspecified site: Secondary | ICD-10-CM | POA: Insufficient documentation

## 2019-06-09 DIAGNOSIS — C3490 Malignant neoplasm of unspecified part of unspecified bronchus or lung: Secondary | ICD-10-CM | POA: Diagnosis not present

## 2019-06-09 DIAGNOSIS — I5022 Chronic systolic (congestive) heart failure: Secondary | ICD-10-CM | POA: Diagnosis not present

## 2019-06-09 DIAGNOSIS — J449 Chronic obstructive pulmonary disease, unspecified: Secondary | ICD-10-CM | POA: Diagnosis not present

## 2019-06-09 DIAGNOSIS — Z5112 Encounter for antineoplastic immunotherapy: Secondary | ICD-10-CM

## 2019-06-09 DIAGNOSIS — E785 Hyperlipidemia, unspecified: Secondary | ICD-10-CM | POA: Diagnosis not present

## 2019-06-09 DIAGNOSIS — I110 Hypertensive heart disease with heart failure: Secondary | ICD-10-CM | POA: Insufficient documentation

## 2019-06-09 DIAGNOSIS — Z79899 Other long term (current) drug therapy: Secondary | ICD-10-CM | POA: Diagnosis not present

## 2019-06-09 DIAGNOSIS — C3411 Malignant neoplasm of upper lobe, right bronchus or lung: Secondary | ICD-10-CM | POA: Diagnosis not present

## 2019-06-09 NOTE — Progress Notes (Signed)
[place] [phone]   [phone]  OFFICE PROGRESS NOTE  Lucianne Lei, MD [address] [place] [address]  DIAGNOSIS: Stage IIIA (T1a, N2, M0) non-small cell lung cancer, adenocarcinoma presented with right upper lobe lung nodule in addition to mediastinal lymphadenopathy diagnosed in March 2018.  Biomarker Findings Tumor Mutational Burden - TMB-Intermediate (8 Muts/Mb) Microsatellite Status - MS-Stable Genomic Findings For a complete list of the genes assayed, please refer to the Appendix. ERBB2 amplification - equivocal? DNMT3A K450f*192 FUBP1 Q40* KEAP1 [id number] TP53 C275F 7 Disease relevant genes with no reportable alterations: EGFR, KRAS, ALK, BRAF, MET, RET, ROS1   PRIOR THERAPY:  1) Course of concurrent chemoradiation with weekly carboplatin for AUC of 2 and paclitaxel 45 MG/M2. Status post 6 cycles. Last cycle was given 10/16/2016. 2)  Consolidation treatment with immunotherapy with Imfinzi (Durvalumab) 10 MG/M2 every 2 weeks. First dose 12/21/2016.  Status post 26 cycles.  CURRENT THERAPY: Observation.  INTERVAL HISTORY: Jason Brallier69.o. male returns to the clinic today for 3-32-monthllow-up visit.  The patient is feeling fine today with no concerning complaints.  He denied having any chest pain, shortness of breath, cough or hemoptysis.  He denied having any fever or chills.  He has no nausea, vomiting, diarrhea or constipation.  He denied having any headache or visual changes.  He had repeat CT scan of the chest performed recently and he is here for evaluation and discussion of his discuss results.  MEDICAL HISTORY: Past Medical History:  Diagnosis Date  . Adenocarcinoma of right lung, stage 3 ([facility]CC[place]/23/2018  . Anemia   . Arthritis    "hx right hip"  . Asthma    "when I was a child"  . Atrial fibrillation (HCC)    Amiodarone started 10/2011; Coumadin  . Automatic implantable  cardioverter-defibrillator in situ 10/03/2012   a. St. Jude ICD implantation 10/03/12.  . Chronic anticoagulation   . Chronic fatigue 10/18/2016  . Chronic systolic heart failure ([facility]  a. Echo 7/13: EF 25%;  b. echo 04/2012:  Mild LVH, EF 30-35%, Gr 1 DD, mild AI, mild MR, mild LAE  . COPD (chronic obstructive pulmonary disease) ([facility] . Dyslipidemia   . Gout   . History of blood transfusion 10/15/2013   "don't know where the blood's going; HgB down to 5"  . Hyperlipidemia   . Hypertension   . Hypothyroidism   . NICM (nonischemic cardiomyopathy) ([facility]  LHCOsborne14:  minimal CAD  . Obesity   . OSA on CPAP   . Tobacco abuse     ALLERGIES:  has No Known Allergies.  MEDICATIONS:  Current Outpatient Medications  Medication Sig Dispense Refill  . acetaminophen (TYLENOL) 500 MG tablet Take 1,000 mg by mouth every 6 (six) hours as needed for mild pain.    . aMarland Kitchenbuterol (VENTOLIN HFA) 108 (90 Base) MCG/ACT inhaler INHALE 2 PUFFS INTO THE LUNGS EVERY 4 (FOUR) HOURS AS NEEDED FOR WHEEZING OR SHORTNESS OF BREATH 18 g 2  . amiodarone (PACERONE) 100 MG tablet Take 1 tablet 5 days a week. 60 tablet 3  . atorvastatin (LIPITOR) 20 MG tablet TAKE 1 TABLET BY MOUTH EVERY DAY 90 tablet 2  . carvedilol (COREG) 12.5 MG tablet TAKE 1 TABLET BY MOUTH TWICE A DAY 180 tablet 3  . [facility] D3 2000 units CAPS Take 2,000 Units by mouth daily.   11  . feeding supplement, ENSURE  ENLIVE, (ENSURE ENLIVE) LIQD Take 237 mLs by mouth 2 (two) times daily between meals. 237 mL 12  . ferrous gluconate (FERGON) 324 MG tablet Take 1 tablet (324 mg total) by mouth 3 (three) times daily with meals. 90 tablet 7  . Fluticasone-Umeclidin-Vilant 100-62.5-25 MCG/INH AEPB TAKE 1 PUFF BY MOUTH EVERY DAY 180 each 3  . furosemide (LASIX) 40 MG tablet TAKE 1 TABLET BY MOUTH EVERY DAY 90 tablet 2  . hydrALAZINE (APRESOLINE) 100 MG tablet TAKE 1 TABLET BY MOUTH THREE TIMES A DAY 270 tablet 1  . levothyroxine (SYNTHROID) 50 MCG tablet TAKE 1  TABLET BY MOUTH DAILY BEFORE BREAKFAST 90 tablet 2  . lidocaine-prilocaine (EMLA) cream APPLY GENEROUS AMOUNT TO PORT SITE AT LEAST 1 HR PRIOR TO TREATMENT. DO NOT RUB IN. 30 g 1  . magnesium oxide (MAG-OX) 400 (241.3 Mg) MG tablet Take 1 tablet (400 mg total) by mouth daily. 30 tablet 0  . Polyvinyl Alcohol-Povidone (REFRESH OP) Place 1 drop into both eyes every morning.    . Potassium Chloride ER 20 MEQ TBCR TAKE 1 TABLET BY MOUTH EVERY DAY 90 tablet 2  . sacubitril-valsartan (ENTRESTO) 49-51 MG Take 1 tablet by mouth 2 (two) times daily. 180 tablet 1  . sildenafil (REVATIO) 20 MG tablet Take as directed. Take 2-5 tablets by mouth as needed (Patient taking differently: Take 20-100 mg by mouth See admin instructions. Take as directed. Take 2-5 tablets by mouth as needed) 30 tablet 1  . spironolactone (ALDACTONE) 25 MG tablet TAKE 1 TABLET BY MOUTH EVERY DAY 90 tablet 2  . ULORIC 40 MG tablet Take 1 tablet (40 mg total) by mouth daily. 30 tablet 0  . XARELTO 20 MG TABS tablet TAKE 1 TABLET BY MOUTH EVERY DAY 90 tablet 2   No current facility-administered medications for this visit.    SURGICAL HISTORY:  Past Surgical History:  Procedure Laterality Date  . CARDIAC DEFIBRILLATOR PLACEMENT  2014  . CARDIOVERSION  2011  . COLONOSCOPY WITH PROPOFOL Left 10/17/2013   Procedure: COLONOSCOPY WITH PROPOFOL;  Surgeon: Inda Castle, MD;  Location: [facility];  Service: Endoscopy;  Laterality: Left;  . ESOPHAGOGASTRODUODENOSCOPY N/A 10/17/2013   Procedure: ESOPHAGOGASTRODUODENOSCOPY (EGD);  Surgeon: Inda Castle, MD;  Location: [facility];  Service: Endoscopy;  Laterality: N/A;  . ESOPHAGOGASTRODUODENOSCOPY (EGD) WITH PROPOFOL N/A 06/14/2018   Procedure: ESOPHAGOGASTRODUODENOSCOPY (EGD) WITH PROPOFOL;  Surgeon: Doran Stabler, MD;  Location: [facility];  Service: Gastroenterology;  Laterality: N/A;  . GIVENS CAPSULE STUDY N/A 10/29/2013   Procedure: GIVENS CAPSULE STUDY;  Surgeon: Inda Castle, MD;  Location: [facility];  Service: Endoscopy;  Laterality: N/A;  . ICD GENERATOR CHANGEOUT N/A 02/13/2018   Procedure: ICD GENERATOR CHANGEOUT;  Surgeon: Constance Haw, MD;  Location: [facility];  Service: Cardiovascular;  Laterality: N/A;  . IMPLANTABLE CARDIOVERTER DEFIBRILLATOR IMPLANT Left 10/03/2012   Procedure: IMPLANTABLE CARDIOVERTER DEFIBRILLATOR IMPLANT;  Surgeon: Deboraha Sprang, MD;  Location: [facility] LAB;  Service: Cardiovascular;  Laterality: Left;  . IR FLUORO GUIDE PORT INSERTION RIGHT  09/21/2016  . IR US GUIDE VASC ACCESS RIGHT  09/21/2016  . JOINT REPLACEMENT    . SAVORY DILATION N/A 06/14/2018   Procedure: SAVORY DILATION;  Surgeon: Doran Stabler, MD;  Location: [facility];  Service: Gastroenterology;  Laterality: N/A;  . TONSILLECTOMY  1950's  . TOTAL HIP ARTHROPLASTY Right 11/25/1997  . VIDEO BRONCHOSCOPY WITH ENDOBRONCHIAL ULTRASOUND N/A 08/09/2016   Procedure: VIDEO BRONCHOSCOPY  WITH ENDOBRONCHIAL ULTRASOUND;  Surgeon: Javier Glazier, MD;  Location[facility];  Service: Thoracic;  Laterality: N/A;    REVIEW OF SYSTEMS:  A comprehensive review of systems was negative.   PHYSICAL EXAMINATION: General appearance: alert, cooperative and no distress Head: Normocephalic, without obvious abnormality, atraumatic Neck: no adenopathy, no JVD, supple, symmetrical, trachea midline and thyroid not enlarged, symmetric, no tenderness/mass/nodules Lymph nodes: Cervical, supraclavicular, and axillary nodes normal. Resp: clear to auscultation bilaterally Back: symmetric, no curvature. ROM normal. No CVA tenderness. Cardio: regular rate and rhythm, S1, S2 normal, no murmur, click, rub or gallop GI: soft, non-tender; bowel sounds normal; no masses,  no organomegaly Extremities: extremities normal, atraumatic, no cyanosis or edema  ECOG PERFORMANCE STATUS: 1 - Symptomatic but completely ambulatory  Blood pressure (!) 145/68, pulse 64, temperature 98 F (36.7  C), temperature source Oral, resp. rate 18, height '6\' 1"'  (1.854 m), weight (!) 305 lb (138.3 kg), SpO2 100 %.  LABORATORY DATA: Lab Results  Component Value Date   WBC 4.3 05/30/2019   HGB 12.8 (L) 05/30/2019   HCT 39.9 05/30/2019   MCV 92.8 05/30/2019   PLT 160 05/30/2019      Chemistry      Component Value Date/Time   NA 143 05/30/2019 1326   NA 142 03/14/2019 0945   NA 141 03/29/2017 1038   K 3.8 05/30/2019 1326   K 4.0 03/29/2017 1038   CL 106 05/30/2019 1326   CO2 30 05/30/2019 1326   CO2 27 03/29/2017 1038   BUN 24 (H) 05/30/2019 1326   BUN 28 (H) 03/14/2019 0945   BUN 28.6 (H) 03/29/2017 1038   CREATININE 1.89 (H) 05/30/2019 1326   CREATININE 1.6 (H) 03/29/2017 1038      Component Value Date/Time   CALCIUM 9.1 05/30/2019 1326   CALCIUM 9.3 03/29/2017 1038   ALKPHOS 111 05/30/2019 1326   ALKPHOS 105 03/29/2017 1038   AST 17 05/30/2019 1326   AST 18 03/29/2017 1038   ALT 13 05/30/2019 1326   ALT 13 03/29/2017 1038   BILITOT 0.4 05/30/2019 1326   BILITOT 0.62 03/29/2017 1038       RADIOGRAPHIC STUDIES: CT Chest Wo Contrast  Result Date: 05/30/2019 CLINICAL DATA:  Non-small cell lung cancer staging evaluation, reportedly completed chemo and radiation in 2018 with immunotherapy in 2019. EXAM: CT CHEST WITHOUT CONTRAST TECHNIQUE: Multidetector CT imaging of the chest was performed following the standard protocol without IV contrast. COMPARISON:  01/24/2019 FINDINGS: Cardiovascular: Right-sided Port-A-Cath terminating at the caval to atrial junction. Left-sided single lead pacer defibrillator, lead in the right heart. Tibial device in the area the right ventricle without change. Heart size stable without pericardial effusion. Scattered aortic atherosclerosis without aneurysm. Central pulmonary vasculature is unremarkable. Vascular assessment limited without intravenous contrast. Mediastinum/Nodes: Stable right paramediastinal and right perihilar soft tissue/fibrosis.  Thickening along the medial aspect of the right mainstem bronchus and subcarinal region is also similar. Question esophageal thickening that is stable. Thoracic inlet structures are normal. No signs of axillary lymphadenopathy. Lungs/Pleura: Apical nodularity contiguous with post treatment changes along the right mediastinal border measuring 15 mm in greatest thickness, previously approximately 17 mm. Tiny nodule at the right lung apex (image 28, series 5) 3 mm. No new or suspicious area of nodularity. No signs of consolidation other than post treatment related changes in the right chest. Upper Abdomen: Signs of nephrolithiasis on the right similar to the prior study. Cortical scarring of the bilateral kidneys partially imaged. No signs of  acute process in the upper abdomen. Musculoskeletal: No acute bone finding or destructive bone process. Spinal degenerative changes. IMPRESSION: 1. Stable post treatment changes in the right chest. No findings to suggest progressive disease. 2. Stable tiny nodule at the right lung apex. No new or suspicious area of nodularity. Resolution of previous area of concern felt to represent post infectious/inflammatory changes in the right lower lobe. 3. Signs of nephrolithiasis on the right without change. Aortic Atherosclerosis (ICD10-I70.0). Electronically Signed   By: Zetta Bills M.D.   On: 05/30/2019 15:38    ASSESSMENT AND PLAN:  This is a very pleasant 69 years old African-American male with stage IIIA non-small cell lung cancer, adenocarcinoma.  The patient completed 6 weeks of concurrent chemoradiation with weekly carboplatin and paclitaxel and tolerated his treatment well except for odynophagia. The patient was then started on consolidation treatment with immunotherapy with Imfinzi (Durvalumab) status post 26 cycles.  The patient has been on observation since that time. He had repeat CT scan of the chest performed recently.  I personally and independently reviewed the  scans and discussed the results with the patient today. His scan showed no concerning findings for disease progression. I recommended for the patient to continue on observation with repeat CT scan of the chest in 4 months. For hypertension he was advised to monitor his blood pressure closely at home and report to his primary care physician for adjustment of his medication if needed. The patient was also advised to call immediately if he has any other concerning symptoms in the interval. The patient voices understanding of current disease status and treatment options and is in agreement with the current care plan. All questions were answered. The patient knows to call the clinic with any problems, questions or concerns. We can certainly see the patient much sooner if necessary. Disclaimer: This note was dictated with voice recognition software. Similar sounding words can inadvertently be transcribed and may not be corrected upon review.

## 2019-06-10 ENCOUNTER — Telehealth: Payer: Self-pay | Admitting: Internal Medicine

## 2019-06-10 NOTE — Telephone Encounter (Signed)
Scheduled per los. Called and left msg. Mailed printout  °

## 2019-06-30 ENCOUNTER — Other Ambulatory Visit: Payer: Self-pay | Admitting: Internal Medicine

## 2019-06-30 ENCOUNTER — Ambulatory Visit (INDEPENDENT_AMBULATORY_CARE_PROVIDER_SITE_OTHER): Payer: Medicare Other

## 2019-06-30 DIAGNOSIS — I5022 Chronic systolic (congestive) heart failure: Secondary | ICD-10-CM

## 2019-06-30 DIAGNOSIS — Z95810 Presence of automatic (implantable) cardiac defibrillator: Secondary | ICD-10-CM

## 2019-07-01 NOTE — Telephone Encounter (Signed)
Pt's pharmacy is requesting a refill on levothyroxine. Would Dr. Harrington Challenger like to refill this medication? Please address

## 2019-07-02 NOTE — Progress Notes (Signed)
EPIC Encounter for ICM Monitoring  Patient Name: Elizeo Rodriques is a 69 y.o. male Date: 07/02/2019 Primary Care Physican: Lucianne Lei, MD Primary Cardiologist:Ross Electrophysiologist: Caryl Comes 3/31/2021Weight: 388TJL  Spoke with patient and he is asymptomatic for fluid accumulation.    CorVueThoracic impedancenormal.  Prescribed:Furosemide40 mg1tablet (40 mg total)daily. Potassium 20 mEq 1 tablet daily.  Labs: 05/30/2019 Creatinine 1.89, BUN 24, Potassium 3.8, Sodium 143, GFR 36-42 A complete set of results can be found in results review  Recommendations:No changes and encouraged to call if experiencing any fluid symptoms.  Follow-up plan: ICM clinic phone appointment on5/06/2019. 91 day device clinic remote transmission4/19/2021.   Copy of ICM check sent to [place].  3 month ICM trend: 06/30/2019    1 Year ICM trend:       [name], RN 07/02/2019 3:08 PM

## 2019-07-09 ENCOUNTER — Other Ambulatory Visit: Payer: Self-pay | Admitting: Physician Assistant

## 2019-07-15 ENCOUNTER — Other Ambulatory Visit: Payer: Self-pay | Admitting: Physician Assistant

## 2019-07-18 ENCOUNTER — Other Ambulatory Visit: Payer: Self-pay | Admitting: Internal Medicine

## 2019-07-18 ENCOUNTER — Other Ambulatory Visit: Payer: Self-pay | Admitting: Physician Assistant

## 2019-07-21 ENCOUNTER — Ambulatory Visit (INDEPENDENT_AMBULATORY_CARE_PROVIDER_SITE_OTHER): Payer: Medicare Other | Admitting: *Deleted

## 2019-07-21 DIAGNOSIS — I5022 Chronic systolic (congestive) heart failure: Secondary | ICD-10-CM

## 2019-07-22 LAB — CUP PACEART REMOTE DEVICE CHECK
Battery Remaining Longevity: 90 mo
Battery Remaining Percentage: 84 %
Battery Voltage: 3.04 V
Brady Statistic RV Percent Paced: 1 %
Date Time Interrogation Session: 20210420020016
HighPow Impedance: 38 Ohm
HighPow Impedance: 38 Ohm
Implantable Lead Implant Date: 20140703
Implantable Lead Location: 753860
Implantable Pulse Generator Implant Date: 20191113
Lead Channel Impedance Value: 410 Ohm
Lead Channel Pacing Threshold Amplitude: 0.5 V
Lead Channel Pacing Threshold Pulse Width: 0.5 ms
Lead Channel Sensing Intrinsic Amplitude: 3.7 mV
Lead Channel Setting Pacing Amplitude: 2.5 V
Lead Channel Setting Pacing Pulse Width: 0.5 ms
Lead Channel Setting Sensing Sensitivity: 0.5 mV
Pulse Gen Serial Number: 9820959

## 2019-07-22 NOTE — Progress Notes (Signed)
ICD Remote  

## 2019-08-04 ENCOUNTER — Ambulatory Visit (INDEPENDENT_AMBULATORY_CARE_PROVIDER_SITE_OTHER): Payer: Medicare Other

## 2019-08-04 DIAGNOSIS — I5022 Chronic systolic (congestive) heart failure: Secondary | ICD-10-CM

## 2019-08-04 DIAGNOSIS — Z95810 Presence of automatic (implantable) cardiac defibrillator: Secondary | ICD-10-CM | POA: Diagnosis not present

## 2019-08-04 NOTE — Progress Notes (Signed)
EPIC Encounter for ICM Monitoring  Patient Name: Jason Russell is a 69 y.o. male Date: 08/04/2019 Primary Care Physican: Lucianne Lei, MD Primary Cardiologist:Ross Electrophysiologist: Caryl Comes 3/31/2021Weight: 285lbs  Attempted call to patient and unable to reach.  Left detailed message per DPR regarding transmission. Transmission reviewed.   CorVueThoracic impedancetrending slightly below baseline normal. Impedance also below baseline from 3/30 - 4/12.  Prescribed:Furosemide40 mg1tablet (40 mg total)daily. Potassium 20 mEq 1 tablet daily.  Labs: 05/30/2019 Creatinine 1.89, BUN 24, Potassium 3.8, Sodium 143, GFR 36-42 A complete set of results can be found in results review  Recommendations: Left voice mail with ICM number. Encouraged to limit salt intake and call if experiencing any fluid symptoms.  Follow-up plan: ICM clinic phone appointment on5/02/2020 to recheck fluid levels. 91 day device clinic remote transmission7/19/2021.   Copy of ICM check sent to Dr.Klein and Dr Harrington Challenger.  3 month ICM trend: 08/04/2019    1 Year ICM trend:       [name], RN 08/04/2019 1:27 PM

## 2019-08-05 ENCOUNTER — Telehealth: Payer: Self-pay

## 2019-08-05 NOTE — Telephone Encounter (Signed)
Remote ICM transmission received.  Attempted call to patient regarding ICM remote transmission and left detailed message per DPR.  Advised to return call for any fluid symptoms or questions. Next ICM remote transmission scheduled 08/12/2019.

## 2019-08-06 NOTE — Progress Notes (Signed)
Maybe he should be added onto appt schedule next week

## 2019-08-12 ENCOUNTER — Ambulatory Visit (INDEPENDENT_AMBULATORY_CARE_PROVIDER_SITE_OTHER): Payer: Medicare Other

## 2019-08-12 DIAGNOSIS — I5022 Chronic systolic (congestive) heart failure: Secondary | ICD-10-CM

## 2019-08-12 DIAGNOSIS — Z95810 Presence of automatic (implantable) cardiac defibrillator: Secondary | ICD-10-CM

## 2019-08-12 NOTE — Progress Notes (Signed)
EPIC Encounter for ICM Monitoring  Patient Name: Jason Russell is a 69 y.o. male Date: 08/12/2019 Primary Care Physican: Lucianne Lei, MD Primary Cardiologist:Ross Electrophysiologist: Caryl Comes 5/11/2021Weight: 202RKY  Spoke with patient. He reports feeling fine.  He said after the 5/3 voice mail message regarding possible fluid accumulation he self adjusted Furosemide by taking 1 extra tablet which correlates with impedance above baseline.  CorVueThoracic impedancehas returned to normal but is trending above baseline suggesting dryness (he reports taking extra Furosemide) but trending back to baseline normal.  Prescribed: Furosemide40 mg1tablet (40 mg total)daily.  Potassium 20 mEq 1 tablet daily.  Labs: 05/30/2019 Creatinine 1.89, BUN 24, Potassium 3.8, Sodium 143, GFR 36-42 A complete set of results can be found in results review  Recommendations: No changes and encouraged to call if experiencing any fluid symptoms.  Follow-up plan: ICM clinic phone appointment on6/10/2019. 91 day device clinic remote transmission7/19/2021.   Copy of ICM check sent to Dr.Klein and Dr Harrington Challenger (show patient's fluid levels have returned to normal).  3 month ICM trend: 08/12/2019    1 Year ICM trend:       [name], RN 08/12/2019 10:28 AM

## 2019-09-08 ENCOUNTER — Ambulatory Visit (INDEPENDENT_AMBULATORY_CARE_PROVIDER_SITE_OTHER): Payer: Medicare Other

## 2019-09-08 ENCOUNTER — Other Ambulatory Visit: Payer: Self-pay | Admitting: Internal Medicine

## 2019-09-08 DIAGNOSIS — I5022 Chronic systolic (congestive) heart failure: Secondary | ICD-10-CM

## 2019-09-08 DIAGNOSIS — D649 Anemia, unspecified: Secondary | ICD-10-CM | POA: Diagnosis not present

## 2019-09-08 DIAGNOSIS — Z95810 Presence of automatic (implantable) cardiac defibrillator: Secondary | ICD-10-CM

## 2019-09-08 DIAGNOSIS — E039 Hypothyroidism, unspecified: Secondary | ICD-10-CM | POA: Diagnosis not present

## 2019-09-08 DIAGNOSIS — I10 Essential (primary) hypertension: Secondary | ICD-10-CM | POA: Diagnosis not present

## 2019-09-09 ENCOUNTER — Other Ambulatory Visit: Payer: Self-pay | Admitting: Internal Medicine

## 2019-09-10 NOTE — Progress Notes (Signed)
EPIC Encounter for ICM Monitoring  Patient Name: Jason Russell is a 69 y.o. male Date: 09/10/2019 Primary Care Physican: Lucianne Lei, MD Primary Cardiologist:Ross Electrophysiologist: Caryl Comes 6/9/2021Weight: 864GEF  Spoke with patient and reports feeling well at this time.  Denies fluid symptoms.    CorVueThoracic impedancenormal.  Prescribed: Furosemide40 mg1tablet (40 mg total)daily.  Potassium 20 mEq 1 tablet daily.  Labs: 05/30/2019 Creatinine 1.89, BUN 24, Potassium 3.8, Sodium 143, GFR 36-42 A complete set of results can be found in results review  Recommendations: No changes and encouraged to call if experiencing any fluid symptoms.  Follow-up plan: ICM clinic phone appointment on7/20/2021. 91 day device clinic remote transmission7/19/2021.   Copy of ICM check sent to [place]  3 month ICM trend: 09/08/2019    1 Year ICM trend:       [name], RN 09/10/2019 9:48 AM

## 2019-09-10 NOTE — Telephone Encounter (Signed)
Okay to refill 30 tablets, refill one.

## 2019-09-11 MED ORDER — SILDENAFIL CITRATE 20 MG PO TABS: ORAL_TABLET | ORAL | 1 refills | Status: DC

## 2019-10-07 ENCOUNTER — Other Ambulatory Visit: Payer: Self-pay

## 2019-10-07 ENCOUNTER — Ambulatory Visit (HOSPITAL_COMMUNITY): Payer: Medicare Other

## 2019-10-07 ENCOUNTER — Inpatient Hospital Stay: Payer: Medicare Other

## 2019-10-07 MED ORDER — FERROUS GLUCONATE 324 (38 FE) MG PO TABS: 324.0000 mg | ORAL_TABLET | Freq: Three times a day (TID) | ORAL | 0 refills | Status: DC

## 2019-10-09 ENCOUNTER — Inpatient Hospital Stay: Payer: Medicare Other | Admitting: Internal Medicine

## 2019-10-13 ENCOUNTER — Encounter (HOSPITAL_COMMUNITY): Payer: Self-pay

## 2019-10-13 ENCOUNTER — Inpatient Hospital Stay: Payer: Medicare Other

## 2019-10-13 ENCOUNTER — Other Ambulatory Visit: Payer: Self-pay

## 2019-10-13 ENCOUNTER — Inpatient Hospital Stay: Payer: Medicare Other | Attending: Internal Medicine

## 2019-10-13 ENCOUNTER — Ambulatory Visit (HOSPITAL_COMMUNITY)
Admission: RE | Admit: 2019-10-13 | Discharge: 2019-10-13 | Disposition: A | Discharge status: Home / Self Care | Payer: Medicare Other | Source: Ambulatory Visit | Attending: Internal Medicine | Admitting: Internal Medicine

## 2019-10-13 DIAGNOSIS — I110 Hypertensive heart disease with heart failure: Secondary | ICD-10-CM | POA: Insufficient documentation

## 2019-10-13 DIAGNOSIS — Z923 Personal history of irradiation: Secondary | ICD-10-CM | POA: Diagnosis not present

## 2019-10-13 DIAGNOSIS — I4891 Unspecified atrial fibrillation: Secondary | ICD-10-CM | POA: Diagnosis not present

## 2019-10-13 DIAGNOSIS — G4733 Obstructive sleep apnea (adult) (pediatric): Secondary | ICD-10-CM | POA: Insufficient documentation

## 2019-10-13 DIAGNOSIS — I5022 Chronic systolic (congestive) heart failure: Secondary | ICD-10-CM | POA: Insufficient documentation

## 2019-10-13 DIAGNOSIS — Z79899 Other long term (current) drug therapy: Secondary | ICD-10-CM | POA: Insufficient documentation

## 2019-10-13 DIAGNOSIS — Z9989 Dependence on other enabling machines and devices: Secondary | ICD-10-CM | POA: Diagnosis not present

## 2019-10-13 DIAGNOSIS — Z95828 Presence of other vascular implants and grafts: Secondary | ICD-10-CM

## 2019-10-13 DIAGNOSIS — C3490 Malignant neoplasm of unspecified part of unspecified bronchus or lung: Secondary | ICD-10-CM

## 2019-10-13 DIAGNOSIS — J449 Chronic obstructive pulmonary disease, unspecified: Secondary | ICD-10-CM | POA: Insufficient documentation

## 2019-10-13 DIAGNOSIS — E785 Hyperlipidemia, unspecified: Secondary | ICD-10-CM | POA: Insufficient documentation

## 2019-10-13 DIAGNOSIS — C3411 Malignant neoplasm of upper lobe, right bronchus or lung: Secondary | ICD-10-CM | POA: Insufficient documentation

## 2019-10-13 DIAGNOSIS — Z9221 Personal history of antineoplastic chemotherapy: Secondary | ICD-10-CM | POA: Insufficient documentation

## 2019-10-13 DIAGNOSIS — E039 Hypothyroidism, unspecified: Secondary | ICD-10-CM | POA: Insufficient documentation

## 2019-10-13 LAB — CMP (CANCER CENTER ONLY)
ALT: 14 U/L (ref 0–44)
AST: 15 U/L (ref 15–41)
Albumin: 3.6 g/dL (ref 3.5–5.0)
Alkaline Phosphatase: 107 U/L (ref 38–126)
Anion gap: 10 (ref 5–15)
BUN: 25 mg/dL — ABNORMAL HIGH (ref 8–23)
CO2: 26 mmol/L (ref 22–32)
Calcium: 9.5 mg/dL (ref 8.9–10.3)
Chloride: 108 mmol/L (ref 98–111)
Creatinine: 1.9 mg/dL — ABNORMAL HIGH (ref 0.61–1.24)
GFR, Est AFR Am: 41 mL/min — ABNORMAL LOW (ref >60)
GFR, Estimated: 36 mL/min — ABNORMAL LOW (ref >60)
Glucose, Bld: 102 mg/dL — ABNORMAL HIGH (ref 70–99)
Potassium: 3.7 mmol/L (ref 3.5–5.1)
Sodium: 144 mmol/L (ref 135–145)
Total Bilirubin: 0.5 mg/dL (ref 0.3–1.2)
Total Protein: 7 g/dL (ref 6.5–8.1)

## 2019-10-13 LAB — CBC WITH DIFFERENTIAL (CANCER CENTER ONLY)
Abs Immature Granulocytes: 0.01 10*3/uL (ref 0.00–0.07)
Basophils Absolute: 0 10*3/uL (ref 0.0–0.1)
Basophils Relative: 1 %
Eosinophils Absolute: 0.1 10*3/uL (ref 0.0–0.5)
Eosinophils Relative: 2 %
HCT: 38.4 % — ABNORMAL LOW (ref 39.0–52.0)
Hemoglobin: 12.3 g/dL — ABNORMAL LOW (ref 13.0–17.0)
Immature Granulocytes: 0 %
Lymphocytes Relative: 10 %
Lymphs Abs: 0.5 10*3/uL — ABNORMAL LOW (ref 0.7–4.0)
MCH: 29.8 pg (ref 26.0–34.0)
MCHC: 32 g/dL (ref 30.0–36.0)
MCV: 93 fL (ref 80.0–100.0)
Monocytes Absolute: 0.8 10*3/uL (ref 0.1–1.0)
Monocytes Relative: 16 %
Neutro Abs: 3.7 10*3/uL (ref 1.7–7.7)
Neutrophils Relative %: 71 %
Platelet Count: 155 10*3/uL (ref 150–400)
RBC: 4.13 MIL/uL — ABNORMAL LOW (ref 4.22–5.81)
RDW: 14.4 % (ref 11.5–15.5)
WBC Count: 5.1 10*3/uL (ref 4.0–10.5)
nRBC: 0 % (ref 0.0–0.2)

## 2019-10-13 MED ORDER — HEPARIN SOD (PORK) LOCK FLUSH 100 UNIT/ML IV SOLN
INTRAVENOUS | Status: AC
  Filled 2019-10-13: qty 5

## 2019-10-13 MED ORDER — SODIUM CHLORIDE 0.9% FLUSH
10.0000 mL | Freq: Once | INTRAVENOUS | Status: AC
  Administered 2019-10-13: 10 mL
  Filled 2019-10-13: qty 10

## 2019-10-13 NOTE — Patient Instructions (Signed)

## 2019-10-16 ENCOUNTER — Other Ambulatory Visit: Payer: Self-pay | Admitting: Adult Health

## 2019-10-16 ENCOUNTER — Other Ambulatory Visit: Payer: Self-pay | Admitting: Internal Medicine

## 2019-10-16 NOTE — Telephone Encounter (Signed)
Pt last saw Dr Harrington Challenger 02/21/19, last labs 10/13/19 Creat 1.90, age 69, weight 138.3kg, CrCl 73.8, based on CrCl pt is on appropriate dosage of Xarelto 20mg  QD.  Will refill rx.

## 2019-10-20 ENCOUNTER — Inpatient Hospital Stay (HOSPITAL_BASED_OUTPATIENT_CLINIC_OR_DEPARTMENT_OTHER): Payer: Medicare Other | Admitting: Internal Medicine

## 2019-10-20 ENCOUNTER — Other Ambulatory Visit: Payer: Self-pay

## 2019-10-20 ENCOUNTER — Ambulatory Visit (INDEPENDENT_AMBULATORY_CARE_PROVIDER_SITE_OTHER): Payer: Medicare Other | Admitting: *Deleted

## 2019-10-20 ENCOUNTER — Encounter: Payer: Self-pay | Admitting: Internal Medicine

## 2019-10-20 VITALS — BP 131/56 | HR 90 | Temp 97.5°F | Resp 20 | Wt 302.7 lb

## 2019-10-20 DIAGNOSIS — Z9221 Personal history of antineoplastic chemotherapy: Secondary | ICD-10-CM | POA: Diagnosis not present

## 2019-10-20 DIAGNOSIS — J449 Chronic obstructive pulmonary disease, unspecified: Secondary | ICD-10-CM | POA: Diagnosis not present

## 2019-10-20 DIAGNOSIS — C3411 Malignant neoplasm of upper lobe, right bronchus or lung: Secondary | ICD-10-CM

## 2019-10-20 DIAGNOSIS — I428 Other cardiomyopathies: Secondary | ICD-10-CM

## 2019-10-20 DIAGNOSIS — C3490 Malignant neoplasm of unspecified part of unspecified bronchus or lung: Secondary | ICD-10-CM | POA: Diagnosis not present

## 2019-10-20 DIAGNOSIS — I5022 Chronic systolic (congestive) heart failure: Secondary | ICD-10-CM | POA: Diagnosis not present

## 2019-10-20 DIAGNOSIS — I110 Hypertensive heart disease with heart failure: Secondary | ICD-10-CM | POA: Diagnosis not present

## 2019-10-20 DIAGNOSIS — Z923 Personal history of irradiation: Secondary | ICD-10-CM | POA: Diagnosis not present

## 2019-10-20 LAB — CUP PACEART REMOTE DEVICE CHECK
Battery Remaining Longevity: 86 mo
Battery Remaining Percentage: 82 %
Battery Voltage: 3.02 V
Brady Statistic RV Percent Paced: 1 %
Date Time Interrogation Session: 20210719020019
HighPow Impedance: 38 Ohm
HighPow Impedance: 38 Ohm
Implantable Lead Implant Date: 20140703
Implantable Lead Location: 753860
Implantable Pulse Generator Implant Date: 20191113
Lead Channel Impedance Value: 380 Ohm
Lead Channel Pacing Threshold Amplitude: 0.5 V
Lead Channel Pacing Threshold Pulse Width: 0.5 ms
Lead Channel Sensing Intrinsic Amplitude: 3.9 mV
Lead Channel Setting Pacing Amplitude: 2.5 V
Lead Channel Setting Pacing Pulse Width: 0.5 ms
Lead Channel Setting Sensing Sensitivity: 0.5 mV
Pulse Gen Serial Number: 9820959

## 2019-10-20 NOTE — Progress Notes (Signed)
[place] [phone]   [phone]  OFFICE PROGRESS NOTE  Lucianne Lei, MD [address] [place] [address]  DIAGNOSIS: Stage IIIA (T1a, N2, M0) non-small cell lung cancer, adenocarcinoma presented with right upper lobe lung nodule in addition to mediastinal lymphadenopathy diagnosed in March 2018.  Biomarker Findings Tumor Mutational Burden - TMB-Intermediate (8 Muts/Mb) Microsatellite Status - MS-Stable Genomic Findings For a complete list of the genes assayed, please refer to the Appendix. ERBB2 amplification - equivocal? DNMT3A K430f*192 FUBP1 Q40* KEAP1 [id number] TP53 C275F 7 Disease relevant genes with no reportable alterations: EGFR, KRAS, ALK, BRAF, MET, RET, ROS1   PRIOR THERAPY:  1) Course of concurrent chemoradiation with weekly carboplatin for AUC of 2 and paclitaxel 45 MG/M2. Status post 6 cycles. Last cycle was given 10/16/2016. 2)  Consolidation treatment with immunotherapy with Imfinzi (Durvalumab) 10 MG/M2 every 2 weeks. First dose 12/21/2016.  Status post 26 cycles.  CURRENT THERAPY: Observation.  INTERVAL HISTORY: Jason Friesen[age] returns to the clinic today for follow-up visit.  The patient is feeling fine today with no concerning complaints.  He denied having any chest pain, shortness of breath, cough or hemoptysis.  The patient denied having any weight loss or night sweats.  He denied having any headache or visual changes.  He has no significant fever or chills.  He has no nausea, vomiting, diarrhea or constipation.  The patient had repeat CT scan of the chest performed recently and is here for evaluation and discussion of his scan results.  MEDICAL HISTORY: Past Medical History:  Diagnosis Date  . Adenocarcinoma of right lung, stage 3 (HCS[place]ann[place]5/23/2018  . Anemia   . Arthritis    "hx right hip"  . Asthma    "when I was a child"  . Atrial fibrillation (HCC)    Amiodarone started 10/2011;  Coumadin  . 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 St. Jude ICD implantation 10/03/12.  . Chronic anticoagulation   . Chronic fatigue 10/18/2016  . Chronic systolic heart failure ([facility]   a. Echo 7/13: EF 25%;  b. echo 04/2012:  Mild LVH, EF 30-35%, Gr 1 DD, mild AI, mild MR, mild LAE  . COPD (chronic obstructive pulmonary disease) ([facility]  . Dyslipidemia   . Gout   . History of blood transfusion 10/15/2013   "don't know where the blood's going; HgB down to 5"  . Hyperlipidemia   . Hypertension   . Hypothyroidism   . NICM (nonischemic cardiomyopathy) ([facility]   [place]/14:  minimal CAD  . Obesity   . OSA on CPAP   . Tobacco abuse     ALLERGIES:  has No Known Allergies.  MEDICATIONS:  Current Outpatient Medications  Medication Sig Dispense Refill  . acetaminophen (TYLENOL) 500 MG tablet Take 1,000 mg by mouth every 6 (six) hours as needed for mild pain.    . Marland Kitchenlbuterol (VENTOLIN HFA) 108 (90 Base) MCG/ACT inhaler INHALE 2 PUFFS INTO THE LUNGS EVERY 4 (FOUR) HOURS AS NEEDED FOR WHEEZING OR SHORTNESS OF BREATH 18 g 2  . amiodarone (PACERONE) 100 MG tablet Take 1 tablet 5 days a week. 60 tablet 3  . atorvastatin (LIPITOR) 20 MG tablet TAKE 1 TABLET BY MOUTH EVERY DAY 90 tablet 2  . carvedilol (COREG) 12.5 MG tablet TAKE 1 TABLET BY MOUTH TWICE A DAY 180 tablet 3  . CVS D3 2000 units CAPS Take 2,000 Units by  mouth daily.   11  . feeding supplement, ENSURE ENLIVE, (ENSURE ENLIVE) LIQD Take 237 mLs by mouth 2 (two) times daily between meals. 237 mL 12  . ferrous gluconate (FERGON) 324 MG tablet Take 1 tablet (324 mg total) by mouth 3 (three) times daily with meals. Please make yearly appt with Dr. Caryl Comes for August before anymore refills. 1st attempt 90 tablet 0  . Fluticasone-Umeclidin-Vilant 100-62.5-25 MCG/INH AEPB TAKE 1 PUFF BY MOUTH EVERY DAY 180 each 3  . furosemide (LASIX) 40 MG tablet TAKE 1 TABLET BY MOUTH EVERY DAY 90 tablet 1  . hydrALAZINE (APRESOLINE)  100 MG tablet TAKE 1 TABLET BY MOUTH THREE TIMES A DAY 270 tablet 1  . levothyroxine (SYNTHROID) 50 MCG tablet TAKE 1 TABLET BY MOUTH DAILY BEFORE BREAKFAST 90 tablet 2  . lidocaine-prilocaine (EMLA) cream APPLY GENEROUS AMOUNT TO PORT SITE AT LEAST 1 HR PRIOR TO TREATMENT. DO NOT RUB IN. 30 g 1  . magnesium oxide (MAG-OX) 400 (241.3 Mg) MG tablet Take 1 tablet (400 mg total) by mouth daily. 30 tablet 0  . Polyvinyl Alcohol-Povidone (REFRESH OP) Place 1 drop into both eyes every morning.    . Potassium Chloride ER 20 MEQ TBCR TAKE 1 TABLET BY MOUTH EVERY DAY 90 tablet 2  . sacubitril-valsartan (ENTRESTO) 49-51 MG Take 1 tablet by mouth 2 (two) times daily. 180 tablet 1  . sildenafil (REVATIO) 20 MG tablet Take 2-5 tablets by mouth daily as needed. 30 tablet 1  . spironolactone (ALDACTONE) 25 MG tablet TAKE 1 TABLET BY MOUTH EVERY DAY 90 tablet 1  . ULORIC 40 MG tablet Take 1 tablet (40 mg total) by mouth daily. 30 tablet 0  . XARELTO 20 MG TABS tablet TAKE 1 TABLET BY MOUTH EVERY DAY 90 tablet 1   No current facility-administered medications for this visit.    SURGICAL HISTORY:  Past Surgical History:  Procedure Laterality Date  . CARDIAC DEFIBRILLATOR PLACEMENT  2014  . CARDIOVERSION  2011  . COLONOSCOPY WITH PROPOFOL Left 10/17/2013   Procedure: COLONOSCOPY WITH PROPOFOL;  Surgeon: Inda Castle, MD;  Location: [facility];  Service: Endoscopy;  Laterality: Left;  . ESOPHAGOGASTRODUODENOSCOPY N/A 10/17/2013   Procedure: ESOPHAGOGASTRODUODENOSCOPY (EGD);  Surgeon: Inda Castle, MD;  Location: [facility];  Service: Endoscopy;  Laterality: N/A;  . ESOPHAGOGASTRODUODENOSCOPY (EGD) WITH PROPOFOL N/A 06/14/2018   Procedure: ESOPHAGOGASTRODUODENOSCOPY (EGD) WITH PROPOFOL;  Surgeon: Doran Stabler, MD;  [facility];  Service: Gastroenterology;  Laterality: N/A;  . GIVENS CAPSULE STUDY N/A 10/29/2013   Procedure: GIVENS CAPSULE STUDY;  Surgeon: Inda Castle, MD;  [facility];  Service: Endoscopy;  Laterality: N/A;  . ICD GENERATOR CHANGEOUT N/A 02/13/2018   Procedure: ICD GENERATOR CHANGEOUT;  Surgeon: Constance Haw, MD;  Location: [facility] CV LAB;  Service: Cardiovascular;  Laterality: N/A;  . IMPLANTABLE CARDIOVERTER DEFIBRILLATOR IMPLANT Left 10/03/2012   Procedure: IMPLANTABLE CARDIOVERTER DEFIBRILLATOR IMPLANT;  Surgeon: Deboraha Sprang, MD;  Location: [facility] CATH LAB;  Service: Cardiovascular;  Laterality: Left;  . IR FLUORO GUIDE PORT INSERTION RIGHT  09/21/2016  . IR US GUIDE VASC ACCESS RIGHT  09/21/2016  . JOINT REPLACEMENT    . SAVORY DILATION N/A 06/14/2018   Procedure: SAVORY DILATION;  Surgeon: Doran Stabler, MD;  Location: [facility];  Service: Gastroenterology;  Laterality: N/A;  . TONSILLECTOMY  1950's  . TOTAL HIP ARTHROPLASTY Right 11/25/1997  . VIDEO BRONCHOSCOPY WITH ENDOBRONCHIAL ULTRASOUND N/A 08/09/2016   Procedure: VIDEO  BRONCHOSCOPY WITH ENDOBRONCHIAL ULTRASOUND;  Surgeon: Javier Glazier, MD;  [facility];  Service: Thoracic;  Laterality: N/A;    REVIEW OF SYSTEMS:  A comprehensive review of systems was negative.   PHYSICAL EXAMINATION: General appearance: alert, cooperative and no distress Head: Normocephalic, without obvious abnormality, atraumatic Neck: no adenopathy, no JVD, supple, symmetrical, trachea midline and thyroid not enlarged, symmetric, no tenderness/mass/nodules Lymph nodes: Cervical, supraclavicular, and axillary nodes normal. Resp: clear to auscultation bilaterally Back: symmetric, no curvature. ROM normal. No CVA tenderness. Cardio: regular rate and rhythm, S1, S2 normal, no murmur, click, rub or gallop GI: soft, non-tender; bowel sounds normal; no masses,  no organomegaly Extremities: extremities normal, atraumatic, no cyanosis or edema  ECOG PERFORMANCE STATUS: 1 - Symptomatic but completely ambulatory  Blood pressure (!) 131/56, pulse 90, temperature (!) 97.5 F (36.4 C), temperature  source Temporal, resp. rate 20, weight (!) 302 lb 11.2 oz (137.3 kg), SpO2 99 %.  LABORATORY DATA: Lab Results  Component Value Date   WBC 5.1 10/13/2019   HGB 12.3 (L) 10/13/2019   HCT 38.4 (L) 10/13/2019   MCV 93.0 10/13/2019   PLT 155 10/13/2019      Chemistry      Component Value Date/Time   NA 144 10/13/2019 1512   NA 142 03/14/2019 0945   NA 141 03/29/2017 1038   K 3.7 10/13/2019 1512   K 4.0 03/29/2017 1038   CL 108 10/13/2019 1512   CO2 26 10/13/2019 1512   CO2 27 03/29/2017 1038   BUN 25 (H) 10/13/2019 1512   BUN 28 (H) 03/14/2019 0945   BUN 28.6 (H) 03/29/2017 1038   CREATININE 1.90 (H) 10/13/2019 1512   CREATININE 1.6 (H) 03/29/2017 1038      Component Value Date/Time   CALCIUM 9.5 10/13/2019 1512   CALCIUM 9.3 03/29/2017 1038   ALKPHOS 107 10/13/2019 1512   ALKPHOS 105 03/29/2017 1038   AST 15 10/13/2019 1512   AST 18 03/29/2017 1038   ALT 14 10/13/2019 1512   ALT 13 03/29/2017 1038   BILITOT 0.5 10/13/2019 1512   BILITOT 0.62 03/29/2017 1038       RADIOGRAPHIC STUDIES: CT Chest Wo Contrast  Result Date: 10/14/2019 CLINICAL DATA:  Non-small cell lung cancer. Chemotherapy and radiation therapy completed 2018. Immunotherapy completed 2019. EXAM: CT CHEST WITHOUT CONTRAST TECHNIQUE: Multidetector CT imaging of the chest was performed following the standard protocol without IV contrast. COMPARISON:  CT 05/30/2019 FINDINGS: Cardiovascular: Port in the anterior chest wall with tip in distal SVC. LEFT-sided pacer. No acute cardiovascular findings. Mediastinum/Nodes: No axillary or supraclavicular adenopathy. Mediastinal or hilar adenopathy. No pericardial effusion. There is mild esophageal wall thickening which is diffuse throughout the entirety esophagus without mass lesion. Single wall thickness measures 7 mm. Lungs/Pleura: Angular pleuroparenchymal scarring medial RIGHT upper lobe is unchanged. Small 3 mm RIGHT upper lobe pulmonary nodule (image 27/7) is  unchanged. No new pulmonary nodules. Airways normal. Upper Abdomen: Limited view of the liver, kidneys, pancreas are unremarkable. Normal adrenal glands. Musculoskeletal: No aggressive osseous lesion IMPRESSION: 1. No evidence of lung cancer recurrence. 2. Post therapy change in the RIGHT lung is stable. 3. Stable small RIGHT upper lobe pulmonary nodule. 4. Diffuse esophageal wall thickening may indicate esophagitis. No significant change from prior. Electronically Signed   By: Suzy Bouchard M.D.   On: 10/14/2019 08:33    ASSESSMENT AND PLAN:  This is a very pleasant 69 years old African-American male with stage IIIA non-small cell lung cancer,  adenocarcinoma.  The patient completed 6 weeks of concurrent chemoradiation with weekly carboplatin and paclitaxel and tolerated his treatment well except for odynophagia. The patient was then started on consolidation treatment with immunotherapy with Imfinzi (Durvalumab) status post 26 cycles.  The patient is currently on observation and he is feeling fine today with no concerning complaints. He had repeat CT scan of the chest performed recently.  I personally and independently reviewed the scans and discussed the results with the patient today. His scan showed no concerning findings for disease recurrence or metastasis. I recommended for him to continue on observation with repeat CT scan of the chest in 6 months. He was advised to call immediately if he has any concerning symptoms in the interval. The patient voices understanding of current disease status and treatment options and is in agreement with the current care plan. All questions were answered. The patient knows to call the clinic with any problems, questions or concerns. We can certainly see the patient much sooner if necessary. Disclaimer: This note was dictated with voice recognition software. Similar sounding words can inadvertently be transcribed and may not be corrected upon review.

## 2019-10-21 ENCOUNTER — Ambulatory Visit (INDEPENDENT_AMBULATORY_CARE_PROVIDER_SITE_OTHER): Payer: Medicare Other

## 2019-10-21 DIAGNOSIS — Z95810 Presence of automatic (implantable) cardiac defibrillator: Secondary | ICD-10-CM | POA: Diagnosis not present

## 2019-10-21 DIAGNOSIS — I5022 Chronic systolic (congestive) heart failure: Secondary | ICD-10-CM

## 2019-10-22 ENCOUNTER — Telehealth: Payer: Self-pay | Admitting: Internal Medicine

## 2019-10-22 NOTE — Progress Notes (Signed)
Remote ICD transmission.   

## 2019-10-22 NOTE — Telephone Encounter (Signed)
Scheduled per los. Called and left msg. mailed printout  

## 2019-10-24 NOTE — Progress Notes (Signed)
EPIC Encounter for ICM Monitoring  Patient Name: Jason Russell is a 69 y.o. male Date: 10/24/2019 Primary Care Physican: Lucianne Lei, MD Primary Cardiologist:Ross Electrophysiologist: Caryl Comes 6/9/2021Weight: [id number]  Spoke with patient and reports feeling well at this time.  Denies fluid symptoms.    CorVueThoracic impedancenormal.  Prescribed: Furosemide40 mg1tablet (40 mg total)daily.  Potassium 20 mEq 1 tablet daily.  Labs: 05/30/2019 Creatinine 1.89, BUN 24, Potassium 3.8, Sodium 143, GFR 36-42 A complete set of results can be found in results review  Recommendations: No changes and encouraged to call if experiencing any fluid symptoms.  Follow-up plan: ICM clinic phone appointment on 11/24/2019.   91 day device clinic remote transmission 01/19/2020.    EP/Cardiology Office Visits: 12/01/2019 with Dr. Harrington Challenger.    Copy of ICM check sent to Dr. Caryl Comes.   3 month ICM trend: 10/20/2019    1 Year ICM trend:       [name], RN 10/24/2019 2:48 PM

## 2019-10-27 ENCOUNTER — Other Ambulatory Visit: Payer: Self-pay | Admitting: Adult Health

## 2019-11-10 DIAGNOSIS — Z8249 Family history of ischemic heart disease and other diseases of the circulatory system: Secondary | ICD-10-CM | POA: Diagnosis not present

## 2019-11-10 DIAGNOSIS — I10 Essential (primary) hypertension: Secondary | ICD-10-CM | POA: Diagnosis not present

## 2019-11-12 ENCOUNTER — Telehealth: Payer: Self-pay | Admitting: Adult Health

## 2019-11-12 MED ORDER — FLUTICASONE-UMECLIDIN-VILANT 100-62.5-25 MCG/INH IN AEPB: INHALATION_SPRAY | RESPIRATORY_TRACT | 0 refills | Status: DC

## 2019-11-12 NOTE — Telephone Encounter (Signed)
Called and spoke with pt letting him know that I was going to send Rx for Trelegy inhaler to the pharmacy for him and stated to him that we could send more refills to pharmacy for him after his appt. Pt verbalized understanding. Nothing further needed.

## 2019-11-24 ENCOUNTER — Ambulatory Visit (INDEPENDENT_AMBULATORY_CARE_PROVIDER_SITE_OTHER): Payer: Medicare Other

## 2019-11-24 DIAGNOSIS — Z95810 Presence of automatic (implantable) cardiac defibrillator: Secondary | ICD-10-CM

## 2019-11-24 DIAGNOSIS — I5022 Chronic systolic (congestive) heart failure: Secondary | ICD-10-CM | POA: Diagnosis not present

## 2019-11-24 NOTE — Progress Notes (Signed)
EPIC Encounter for ICM Monitoring  Patient Name: Jason Russell is a 69 y.o. male Date: 11/24/2019 Primary Care Physican: Lucianne Lei, MD Primary Cardiologist:Ross Electrophysiologist: Caryl Comes 8/23/2021Weight: [id number]  Spoke with patient and reports feeling well at this time. Denies fluid symptoms. He has been working outside more and drinking more fluids.  Advised if he is outside in the heat he needs to continue with higher fluid intake but if he is indoors then limit to 64 oz daily  CorVueThoracic impedancesuggesting possible fluid accumulation with trending back to baseline.  Prescribed: Furosemide40 mg1tablet (40 mg total)daily.  Potassium 20 mEq 1 tablet daily.  Labs: 05/30/2019 Creatinine 1.89, BUN 24, Potassium 3.8, Sodium 143, GFR 36-42 A complete set of results can be found in results review  Recommendations: Recommendation to limit salt intake to 2000 mg daily.  Encouraged to call if experiencing any fluid symptoms.   Follow-up plan: ICM clinic phone appointment on 12/01/2019 (manual send) to recheck fluids.   91 day device clinic remote transmission 01/19/2020.    EP/Cardiology Office Visits: 12/01/2019 with Dr. Harrington Challenger.    Copy of ICM check sent to Dr. Caryl Comes.   3 month ICM trend: 11/24/2019    1 Year ICM trend:       [name], RN 11/24/2019 5:07 PM

## 2019-11-30 NOTE — Progress Notes (Signed)
Cardiology Office Note   Date:  12/01/2019   ID:  Jason Russell, DOB Jul 11, 1950, MRN [id number]  PCP:  Jason Lei, MD  Cardiologist:   Jason Carnes, MD   F/U of systolic CHF and atrial fibrillation     History of Present Illness:  Jason Russell is a 69 y.o. male with hx of nonobstructive CAD (cath 2014), HTN, OSA,lung CA (s/p XRT and chemo), PAF (on amiodarone), chronic systolic CHF  Last echo in Feb 2020 LVEF 35 to 40%     Pt is s/p ICD implant in 2014 for primary prevention.  He is followed by Jason Russell Last gen change in 2019.     I saw the pt in Nov 2020 The pt says he is doing OK   Knows he needs to get back to activity   He denies CP  Breathing is OK  No dizziness. No palpitations       Current Meds  Medication Sig  . acetaminophen (TYLENOL) 500 MG tablet Take 1,000 mg by mouth every 6 (six) hours as needed for mild pain.  [name] albuterol (VENTOLIN HFA) 108 (90 Base) MCG/ACT inhaler INHALE 2 PUFFS INTO THE LUNGS EVERY 4 (FOUR) HOURS AS NEEDED FOR WHEEZING OR SHORTNESS OF BREATH  . amiodarone (PACERONE) 100 MG tablet Take 1 tablet 5 days a week.  [name] atorvastatin (LIPITOR) 20 MG tablet TAKE 1 TABLET BY MOUTH EVERY DAY  . carvedilol (COREG) 12.5 MG tablet TAKE 1 TABLET BY MOUTH TWICE A DAY  . clotrimazole-betamethasone (LOTRISONE) cream Apply topically.  . CVS D3 2000 units CAPS Take 2,000 Units by mouth daily.   . feeding supplement, ENSURE ENLIVE, (ENSURE ENLIVE) LIQD Take 237 mLs by mouth 2 (two) times daily between meals.  . ferrous gluconate (FERGON) 324 MG tablet Take 1 tablet (324 mg total) by mouth 3 (three) times daily with meals. Please make yearly appt with Dr. Caryl Comes for August before anymore refills. 1st attempt  . Fluticasone-Umeclidin-Vilant 100-62.5-25 MCG/INH AEPB TAKE 1 PUFF BY MOUTH EVERY DAY  . furosemide (LASIX) 40 MG tablet TAKE 1 TABLET BY MOUTH EVERY DAY  . hydrALAZINE (APRESOLINE) 100 MG tablet TAKE 1 TABLET BY MOUTH THREE TIMES A DAY  . levothyroxine  (SYNTHROID) 50 MCG tablet TAKE 1 TABLET BY MOUTH DAILY BEFORE BREAKFAST  . lidocaine-prilocaine (EMLA) cream APPLY GENEROUS AMOUNT TO PORT SITE AT LEAST 1 HR PRIOR TO TREATMENT. DO NOT RUB IN.  . magnesium oxide (MAG-OX) 400 (241.3 Mg) MG tablet Take 1 tablet (400 mg total) by mouth daily.  . Polyvinyl Alcohol-Povidone (REFRESH OP) Place 1 drop into both eyes every morning.  . Potassium Chloride ER 20 MEQ TBCR TAKE 1 TABLET BY MOUTH EVERY DAY  . sacubitril-valsartan (ENTRESTO) 49-51 MG Take 1 tablet by mouth 2 (two) times daily.  . sildenafil (REVATIO) 20 MG tablet Take 2-5 tablets by mouth daily as needed.  [name] spironolactone (ALDACTONE) 25 MG tablet TAKE 1 TABLET BY MOUTH EVERY DAY  . ULORIC 40 MG tablet Take 1 tablet (40 mg total) by mouth daily.  Jason Russell 20 MG TABS tablet TAKE 1 TABLET BY MOUTH EVERY DAY     Allergies:   Patient has no known allergies.   Past Medical History:  Diagnosis Date  . Adenocarcinoma of right lung, stage 3 (Jason Russell) 08/23/2016  . Anemia   . Arthritis    "hx right hip"  . Asthma    "when I was a child"  . Atrial fibrillation (HCC)    Amiodarone  started 10/2011; Coumadin  . 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 St. Jude ICD implantation 10/03/12.  . Chronic anticoagulation   . Chronic fatigue 10/18/2016  . Chronic systolic heart failure ([place])    a. Echo 7/13: EF 25%;  b. echo 04/2012:  Mild LVH, EF 30-35%, Gr 1 DD, mild AI, mild MR, mild LAE  . COPD (chronic obstructive pulmonary disease) ([place])   . Dyslipidemia   . Gout   . History of blood transfusion 10/15/2013   "don't know where the blood's going; HgB down to 5"  . Hyperlipidemia   . Hypertension   . Hypothyroidism   . NICM (nonischemic cardiomyopathy) ([place])    [place] 4/14:  minimal CAD  . Obesity   . OSA on CPAP   . Tobacco abuse     Past Surgical History:  Procedure Laterality Date  . CARDIAC DEFIBRILLATOR PLACEMENT  2014  . CARDIOVERSION  2011  . COLONOSCOPY  WITH PROPOFOL Left 10/17/2013   Procedure: COLONOSCOPY WITH PROPOFOL;  Surgeon: Inda Castle, MD;  Location: [facility];  Service: Endoscopy;  Laterality: Left;  . ESOPHAGOGASTRODUODENOSCOPY N/A 10/17/2013   Procedure: ESOPHAGOGASTRODUODENOSCOPY (EGD);  Surgeon: Inda Castle, MD;  Location: [facility];  Service: Endoscopy;  Laterality: N/A;  . ESOPHAGOGASTRODUODENOSCOPY (EGD) WITH PROPOFOL N/A 06/14/2018   Procedure: ESOPHAGOGASTRODUODENOSCOPY (EGD) WITH PROPOFOL;  Surgeon: Doran Stabler, MD;  [facility];  Service: Gastroenterology;  Laterality: N/A;  . GIVENS CAPSULE STUDY N/A 10/29/2013   Procedure: GIVENS CAPSULE STUDY;  Surgeon: Inda Castle, MD;  [facility];  Service: Endoscopy;  Laterality: N/A;  . ICD GENERATOR CHANGEOUT N/A 02/13/2018   Procedure: ICD GENERATOR CHANGEOUT;  Surgeon: Constance Haw, MD;  Location: [place] CV LAB;  Service: Cardiovascular;  Laterality: N/A;  . IMPLANTABLE CARDIOVERTER DEFIBRILLATOR IMPLANT Left 10/03/2012   Procedure: IMPLANTABLE CARDIOVERTER DEFIBRILLATOR IMPLANT;  Surgeon: Deboraha Sprang, MD;  Location: [facility] CATH LAB;  Service: Cardiovascular;  Laterality: Left;  . IR FLUORO GUIDE PORT INSERTION RIGHT  09/21/2016  . IR US GUIDE VASC ACCESS RIGHT  09/21/2016  . JOINT REPLACEMENT    . SAVORY DILATION N/A 06/14/2018   Procedure: SAVORY DILATION;  Surgeon: Doran Stabler, MD;  Location[facility];  Service: Gastroenterology;  Laterality: N/A;  . TONSILLECTOMY  1950's  . TOTAL HIP ARTHROPLASTY Right 11/25/1997  . VIDEO BRONCHOSCOPY WITH ENDOBRONCHIAL ULTRASOUND N/A 08/09/2016   Procedure: VIDEO BRONCHOSCOPY WITH ENDOBRONCHIAL ULTRASOUND;  Surgeon: Javier Glazier, MD;  Location: [place];  Service: Thoracic;  Laterality: N/A;     Social History:  The patient  reports that he quit smoking about 3 years ago. His smoking use included cigarettes. He has a 11.25 pack-year smoking history. He has never used smokeless  tobacco. He reports current alcohol use of about 1.0 standard drink of alcohol per week. He reports that he does not use drugs.   Family History:  The patient's family history includes Heart Problems in his mother; Hypertension in his mother; Lung cancer in his brother; Other in his father.    ROS:  Please see the history of present illness. All other systems are reviewed and  Negative to the above problem except as noted.    PHYSICAL EXAM: VS:  BP (!) 152/90   Pulse 67   Ht 6\' 1"  (1.854 m)   Wt (!) 306 lb (138.8 kg)   SpO2 97%   BMI 40.37 kg/m   GEN: Morbidly obese 69 yo  in no acute distress  HEENT: normal  Neck: no JVD Cardiac: RRR; no murmurs, Tr LE edema (wearing support hose) Respiratory:  clear to auscultation bilaterally, normal work of breathing GI: soft, nontender, nondistended, + BS  No hepatomegaly  MS: no deformity Moving all extremities   Skin: warm and dry, no rash Neuro:  Strength and sensation are intact Psych: euthymic mood, full affect   EKG:  EKG is ordered today.  SR 80 bpm   RBBB  LAFB     Lipid Panel    Component Value Date/Time   CHOL 150 12/23/2012 1205   TRIG 73.0 12/23/2012 1205   HDL 46.60 12/23/2012 1205   CHOLHDL 3 12/23/2012 1205   VLDL 14.6 12/23/2012 1205   LDLCALC 89 12/23/2012 1205      Wt Readings from Last 3 Encounters:  12/01/19 (!) 306 lb (138.8 kg)  12/01/19 (!) 305 lb (138.3 kg)  10/20/19 (!) 302 lb 11.2 oz (137.3 kg)      ASSESSMENT AND PLAN:  1  Chronic systolic CHF    VOlume status overall OK   Tolerating current regimen  Continue     2  CAD   Nonobstructive.  No symptoms of angina  3    Atrial fibrillation   Continue low dose amiodarone     4   HTN   BP is elevated  Was lower earlier today   He knows his wt is up      Would recomm he double his Entresto   Would check BMET in 2 weeks     Current medicines are reviewed at length with the patient today.  The patient does not have concerns regarding  medicines.  Signed, Jason Carnes, MD  12/01/2019 2:37 PM    [address], [place], [place]  [id number] Phone: [phone]; Fax: [phone]

## 2019-12-01 ENCOUNTER — Ambulatory Visit (INDEPENDENT_AMBULATORY_CARE_PROVIDER_SITE_OTHER): Payer: Medicare Other | Admitting: Adult Health

## 2019-12-01 ENCOUNTER — Encounter: Payer: Self-pay | Admitting: Internal Medicine

## 2019-12-01 ENCOUNTER — Ambulatory Visit (INDEPENDENT_AMBULATORY_CARE_PROVIDER_SITE_OTHER): Payer: Medicare Other

## 2019-12-01 ENCOUNTER — Other Ambulatory Visit: Payer: Self-pay

## 2019-12-01 ENCOUNTER — Ambulatory Visit (INDEPENDENT_AMBULATORY_CARE_PROVIDER_SITE_OTHER): Payer: Medicare Other | Admitting: Internal Medicine

## 2019-12-01 ENCOUNTER — Encounter: Payer: Self-pay | Admitting: Adult Health

## 2019-12-01 VITALS — BP 152/90 | HR 67 | Ht 73.0 in | Wt 306.0 lb

## 2019-12-01 DIAGNOSIS — I10 Essential (primary) hypertension: Secondary | ICD-10-CM | POA: Diagnosis not present

## 2019-12-01 DIAGNOSIS — I5022 Chronic systolic (congestive) heart failure: Secondary | ICD-10-CM | POA: Diagnosis not present

## 2019-12-01 DIAGNOSIS — Z95810 Presence of automatic (implantable) cardiac defibrillator: Secondary | ICD-10-CM

## 2019-12-01 DIAGNOSIS — C3411 Malignant neoplasm of upper lobe, right bronchus or lung: Secondary | ICD-10-CM

## 2019-12-01 DIAGNOSIS — J430 Unilateral pulmonary emphysema [MacLeod's syndrome]: Secondary | ICD-10-CM | POA: Diagnosis not present

## 2019-12-01 DIAGNOSIS — G4733 Obstructive sleep apnea (adult) (pediatric): Secondary | ICD-10-CM

## 2019-12-01 MED ORDER — FLUTICASONE-UMECLIDIN-VILANT 100-62.5-25 MCG/INH IN AEPB: INHALATION_SPRAY | RESPIRATORY_TRACT | 11 refills | Status: DC

## 2019-12-01 MED ORDER — SILDENAFIL CITRATE 100 MG PO TABS: 100.0000 mg | ORAL_TABLET | Freq: Every day | ORAL | 1 refills | Status: DC | PRN

## 2019-12-01 NOTE — Assessment & Plan Note (Signed)
Severe COPD currently stable on Trelegy.  Patient is encouraged on activity as tolerated.  Encouraged on weight loss.  Plan  Patient Instructions  Continue on TRELEGY daily , rinse after use Activity as tolerated.   Follow up with Oncology as planned   Continue on CPAP at bedtime Do not drive a sleepy Work on [employer] .    Follow up with Dr. Lamonte Sakai  In 6 months and As needed

## 2019-12-01 NOTE — Assessment & Plan Note (Signed)
Severe obstructive sleep apnea with excellent control and compliance on nocturnal CPAP  Plan  Patient Instructions  Continue on TRELEGY daily , rinse after use Activity as tolerated.   Follow up with Oncology as planned   Continue on CPAP at bedtime Do not drive a sleepy Work on [employer] .    Follow up with Dr. Lamonte Sakai  In 6 months and As needed

## 2019-12-01 NOTE — Progress Notes (Signed)
EPIC Encounter for ICM Monitoring  Patient Name: Jason Russell is a 69 y.o. male Date: 12/01/2019 Primary Care Physican: Lucianne Lei, MD Primary Cardiologist:Ross Electrophysiologist: Caryl Comes 8/23/2021Weight: 285lbs  Transmission reviewed.  CorVueThoracic impedancereturned to normal.  Impedance suggested possible fluid accumulation dates from 8/5-8/10, 8/13-8/17, 8/20-8/23.  Suggested possible dryness 8/1-8/4 and 8/24-8/27.  Prescribed: Furosemide40 mg1tablet (40 mg total)daily.  Potassium 20 mEq 1 tablet daily.  Labs: 05/30/2019 Creatinine 1.89, BUN 24, Potassium 3.8, Sodium 143, GFR 36-42 A complete set of results can be found in results review  Recommendations: Copy sent to Dr Harrington Challenger since patient has office visit today, 12/01/2019.   Follow-up plan: ICM clinic phone appointment on9/27/2021. 91 day device clinic remote transmission 01/19/2020.   EP/Cardiology Office Visits:12/01/2019 with Dr.Ross.   Copy of ICM check sent to [place].   3 month ICM trend: 12/01/2019    1 Year ICM trend:       [name], RN 12/01/2019 8:33 AM

## 2019-12-01 NOTE — Patient Instructions (Signed)
Continue on TRELEGY daily , rinse after use Activity as tolerated.   Follow up with Oncology as planned   Continue on CPAP at bedtime Do not drive a sleepy Work on [employer] .    Follow up with Dr. Lamonte Sakai  In 6 months and As needed

## 2019-12-01 NOTE — Progress Notes (Signed)
@Patient  ID: Jason Russell, male    DOB: Jan 10, 1951, 69 y.o.   MRN: [id number]  Chief Complaint  Patient presents with  . Follow-up    COPD     Referring provider: Lucianne Lei, MD  HPI: 69 year old male followed for severe COPD, severe obstructive sleep apnea and history of non-small cell lung cancer Medical history significant for nonischemic heart disease, Cardiomyopathy /CHF 35-40%  A. fib, status post ICD and on amiodarone  TEST/EVENTS :  07/31/16: FVC 1.84 L (41%) FEV1 1.14 L (42%) FEV1/FVC 0.77 FEF 25-75 1.17 L (38%) positive bronchodilator response TLC 5.76 L (75%) RV 140% ERV 48% DLCO uncorrected 52%   IMAGING PET CT 07/31/16 Markedly hypermetabolic posterior right upper lobe pulmonary lesion consistent with malignancy and hypermetabolic mediastinal lymph node metastases.  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 This obscures the regional right upper lobe apical lung nodule. No obvious nodular growth or recurrent thoracic adenopathy.  PATHOLOGY FNA RUL 09/06/16: Adenocarcinoma  EBUS 4R &7 08/09/16: Adenocarcinoma  Vaccines : PVX , Influenza are utd.  12/01/2019 Follow up : COPD , OSA , Lung cancer  Patient presents for a follow-up. Last seen via telemedicine visit 07/2018 .  Patient has underlying severe COPD.  Remains on Trelegy daily.  Patient says overall breathing is doing okay.  Gets winded with moderate to heavy activity.  Denies any increased cough or wheezing.  He is on amiodarone and Entresto.  Remains active with home chores. 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 No yard work. Does fish.   Patient has underlying of severe obstructive sleep apnea is on nocturnal CPAP.  Patient says he wears it every night with no missed nights.  Patient is on auto CPAP 5 to 20 cm H2O.  CPAP download shows excellent compliance with daily average usage at 9 hours.  AHI 1.5.  Minimal leaks. Has the So clean machine-loves it.    Patient was diagnosed with stage IIIa non-small cell lung cancer, adenocarcinoma with right upper lobe lung nodule in addition to mediastinal lymphadenopathy in May 2018.  He follows with medical and radiation oncology.  Treated with XRT in July 2018.  Completed carboplatin and paclitaxel in 2018.  Immunotherapy Imfinzi completed.  Most recent CT chest October 13, 2019 showed no evidence of lung cancer recurrence.,  Post therapy change in the right lung is stable.  And stable small right upper lobe lung nodule.  Covid vaccine utd.    No Known Allergies  Immunization History  Administered Date(s) Administered  . Influenza Split 10/27/2016  . Influenza,inj,Quad PF,6+ Mos 12/08/2015  . PFIZER SARS-COV-2 Vaccination 05/10/2019, 06/02/2019  . Pneumococcal Polysaccharide-23 10/16/2013  . Pneumococcal-Unspecified 10/27/2016    Past Medical History:  Diagnosis Date  . Adenocarcinoma of right lung, stage 3 ([place]) 08/23/2016  . Anemia   . Arthritis    "hx right hip"  . Asthma    "when I was a child"  . Atrial fibrillation (HCC)    Amiodarone started 10/2011; Coumadin  . 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 St. Jude ICD implantation 10/03/12.  . Chronic anticoagulation   . Chronic fatigue 10/18/2016  . Chronic systolic heart failure ([place])    a. Echo 7/13: EF 25%;  b. echo 04/2012:  Mild LVH, EF 30-35%, Gr 1 DD, mild AI, mild MR, mild LAE  . COPD (chronic obstructive pulmonary disease) ([place])   . Dyslipidemia   . Gout   . History of blood transfusion 10/15/2013   "don't know  where the blood's going; HgB down to 5"  . Hyperlipidemia   . Hypertension   . Hypothyroidism   . NICM (nonischemic cardiomyopathy) ([place])    [place] 4/14:  minimal CAD  . Obesity   . OSA on CPAP   . Tobacco abuse     Tobacco History: Social History   Tobacco Use  Smoking Status Former Smoker  . Packs/day: 0.25  . Years: 45.00  . Pack years: 11.25  . Types: Cigarettes  . Quit  date: 07/11/2016  . Years since quitting: 3.3  Smokeless Tobacco Never Used   Counseling given: Not Answered   Outpatient Medications Prior to Visit  Medication Sig Dispense Refill  . acetaminophen (TYLENOL) 500 MG tablet Take 1,000 mg by mouth every 6 (six) hours as needed for mild pain.    [name] albuterol (VENTOLIN HFA) 108 (90 Base) MCG/ACT inhaler INHALE 2 PUFFS INTO THE LUNGS EVERY 4 (FOUR) HOURS AS NEEDED FOR WHEEZING OR SHORTNESS OF BREATH 18 g 2  . amiodarone (PACERONE) 100 MG tablet Take 1 tablet 5 days a week. 60 tablet 3  . atorvastatin (LIPITOR) 20 MG tablet TAKE 1 TABLET BY MOUTH EVERY DAY 90 tablet 2  . carvedilol (COREG) 12.5 MG tablet TAKE 1 TABLET BY MOUTH TWICE A DAY 180 tablet 3  . [facility] D3 2000 units CAPS Take 2,000 Units by mouth daily.   11  . feeding supplement, ENSURE ENLIVE, (ENSURE ENLIVE) LIQD Take 237 mLs by mouth 2 (two) times daily between meals. 237 mL 12  . ferrous gluconate (FERGON) 324 MG tablet Take 1 tablet (324 mg total) by mouth 3 (three) times daily with meals. Please make yearly appt with Dr. Caryl Comes for August before anymore refills. 1st attempt 90 tablet 0  . furosemide (LASIX) 40 MG tablet TAKE 1 TABLET BY MOUTH EVERY DAY 90 tablet 1  . hydrALAZINE (APRESOLINE) 100 MG tablet TAKE 1 TABLET BY MOUTH THREE TIMES A DAY 270 tablet 1  . levothyroxine (SYNTHROID) 50 MCG tablet TAKE 1 TABLET BY MOUTH DAILY BEFORE BREAKFAST 90 tablet 2  . lidocaine-prilocaine (EMLA) cream APPLY GENEROUS AMOUNT TO PORT SITE AT LEAST 1 HR PRIOR TO TREATMENT. DO NOT RUB IN. 30 g 1  . magnesium oxide (MAG-OX) 400 (241.3 Mg) MG tablet Take 1 tablet (400 mg total) by mouth daily. 30 tablet 0  . Polyvinyl Alcohol-Povidone (REFRESH OP) Place 1 drop into both eyes every morning.    . Potassium Chloride ER 20 MEQ TBCR TAKE 1 TABLET BY MOUTH EVERY DAY 90 tablet 2  . sacubitril-valsartan (ENTRESTO) 49-51 MG Take 1 tablet by mouth 2 (two) times daily. 180 tablet 1  . sildenafil (REVATIO) 20 MG  tablet Take 2-5 tablets by mouth daily as needed. 30 tablet 1  . spironolactone (ALDACTONE) 25 MG tablet TAKE 1 TABLET BY MOUTH EVERY DAY 90 tablet 1  . ULORIC 40 MG tablet Take 1 tablet (40 mg total) by mouth daily. 30 tablet 0  . XARELTO 20 MG TABS tablet TAKE 1 TABLET BY MOUTH EVERY DAY 90 tablet 1  . Fluticasone-Umeclidin-Vilant 100-62.5-25 MCG/INH AEPB TAKE 1 PUFF BY MOUTH EVERY DAY 60 each 0   No facility-administered medications prior to visit.     Review of Systems:   Constitutional:   No  weight loss, night sweats,  Fevers, chills,  +fatigue, or  lassitude.  HEENT:   No headaches,  Difficulty swallowing,  Tooth/dental problems, or  Sore throat,  No sneezing, itching, ear ache, nasal congestion, post nasal drip,   CV:  No chest pain,  Orthopnea, PND, +swelling in lower extremities,  No anasarca, dizziness, palpitations, syncope.   GI  No heartburn, indigestion, abdominal pain, nausea, vomiting, diarrhea, change in bowel habits, loss of appetite, bloody stools.   Resp:  No excess mucus, no productive cough,  No non-productive cough,  No coughing up of blood.  No change in color of mucus.  No wheezing.  No chest wall deformity  Skin: no rash or lesions.  GU: no dysuria, change in color of urine, no urgency or frequency.  No flank pain, no hematuria   MS:  No joint pain or swelling.  No decreased range of motion.  No back pain.    Physical Exam  BP 128/68 (BP Location: Left Arm, Cuff Size: Large)   Pulse 79   Temp 98.3 F (36.8 C) (Temporal)   Ht 6\' 1"  (1.854 m)   Wt (!) 305 lb (138.3 kg)   SpO2 98% Comment: RA  BMI 40.24 kg/m   GEN: A/Ox3; pleasant , NAD, BMI 40    HEENT:  [place]/AT,   , NOSE-clear, THROAT-clear, no lesions, no postnasal drip or exudate noted.   NECK:  Supple w/ fair ROM; no JVD; normal carotid impulses w/o bruits; no thyromegaly or nodules palpated; no lymphadenopathy.    RESP  Clear  P & A; w/o, wheezes/ rales/ or rhonchi. no  accessory muscle use, no dullness to percussion  CARD:  RRR, no m/r/g, 1+peripheral edema, pulses intact, no cyanosis or clubbing.  GI:   Soft & nt; nml bowel sounds; no organomegaly or masses detected.   Musco: Warm bil, no deformities or joint swelling noted.   Neuro: alert, no focal deficits noted.    Skin: Warm, no lesions or rashes    Lab Results:  BMET   PFT Results Latest Ref Rng & Units 07/31/2016  FVC-Pre L 1.84  FVC-Predicted Pre % 41  FVC-Post L 2.32  FVC-Predicted Post % 52  Pre FEV1/FVC % % 77  Post FEV1/FCV % % 73  FEV1-Pre L 1.42  FEV1-Predicted Pre % 42  FEV1-Post L 1.69  DLCO uncorrected ml/min/mmHg 19.27  DLCO UNC% % 52  DLVA Predicted % 109  TLC L 5.76  TLC % Predicted % 75  RV % Predicted % 140    No results found for: NITRICOXIDE      Assessment & Plan:   COPD (chronic obstructive pulmonary disease) (HCC) Severe COPD currently stable on Trelegy.  Patient is encouraged on activity as tolerated.  Encouraged on weight loss.  Plan  Patient Instructions  Continue on TRELEGY daily , rinse after use Activity as tolerated.   Follow up with Oncology as planned   Continue on CPAP at bedtime Do not drive a sleepy Work on [employer] .    Follow up with Dr. Lamonte Sakai  In 6 months and As needed       Obstructive sleep apnea Severe obstructive sleep apnea with excellent control and compliance on nocturnal CPAP  Plan  Patient Instructions  Continue on TRELEGY daily , rinse after use Activity as tolerated.   Follow up with Oncology as planned   Continue on CPAP at bedtime Do not drive a sleepy Work on [employer] .    Follow up with Dr. Lamonte Sakai  In 6 months and As needed       Primary cancer of right upper lobe of lung [facility]) Lung cancer status  post chemo and radiation and immunotherapy. Serial CT chest most recent July 2021 with no evidence of recurrence.  Continue follow-up with oncology     Rexene Edison, NP 12/01/2019

## 2019-12-01 NOTE — Patient Instructions (Addendum)
Medication Instructions:  No changes *If you need a refill on your cardiac medications before your next appointment, please call your pharmacy*   Lab Work: Today: BMET, TSH, LIPIDS, MAGNESIUM  If you have labs (blood work) drawn today and your tests are completely normal, you will receive your results only by: [name] MyChart Message (if you have MyChart) OR . A paper copy in the mail If you have any lab test that is abnormal or we need to change your treatment, we will call you to review the results.   Testing/Procedures: none   Follow-Up: At [facility], you and your health needs are our priority.  As part of our continuing mission to provide you with exceptional heart care, we have created designated Provider Care Teams.  These Care Teams include your primary Cardiologist (physician) and Advanced Practice Providers (APPs -  Physician Assistants and Nurse Practitioners) who all work together to provide you with the care you need, when you need it.  Your next appointment:   3 month(s) (December)  The format for your next appointment:   In Person  Provider:   You may see Dorris Carnes, MD or one of the following Advanced Practice Providers on your designated Care Team:    Richardson Dopp, PA-C  Robbie Lis, [place]    Other Instructions

## 2019-12-01 NOTE — Assessment & Plan Note (Signed)
Lung cancer status post chemo and radiation and immunotherapy. Serial CT chest most recent July 2021 with no evidence of recurrence.  Continue follow-up with oncology

## 2019-12-02 ENCOUNTER — Telehealth: Payer: Self-pay | Admitting: Internal Medicine

## 2019-12-02 DIAGNOSIS — I5022 Chronic systolic (congestive) heart failure: Secondary | ICD-10-CM

## 2019-12-02 DIAGNOSIS — I10 Essential (primary) hypertension: Secondary | ICD-10-CM

## 2019-12-02 NOTE — Telephone Encounter (Signed)
REview of clinic visit while dictating note,    I would recomm increasing Entresto for better Blood pressure control   Ease work load on heart [id number]    Follow up BMET in 2 wks

## 2019-12-03 LAB — BASIC METABOLIC PANEL
BUN/Creatinine Ratio: 16 (ref 10–24)
BUN: 27 mg/dL (ref 8–27)
CO2: 22 mmol/L (ref 20–29)
Calcium: 9.7 mg/dL (ref 8.6–10.2)
Chloride: 105 mmol/L (ref 96–106)
Creatinine, Ser: 1.66 mg/dL — ABNORMAL HIGH (ref 0.76–1.27)
GFR calc Af Amer: 48 mL/min/{1.73_m2} — ABNORMAL LOW (ref >59)
GFR calc non Af Amer: 42 mL/min/{1.73_m2} — ABNORMAL LOW (ref >59)
Glucose: 103 mg/dL — ABNORMAL HIGH (ref 65–99)
Potassium: 3.7 mmol/L (ref 3.5–5.2)
Sodium: 145 mmol/L — ABNORMAL HIGH (ref 134–144)

## 2019-12-03 LAB — LIPID PANEL
Chol/HDL Ratio: 3.5 ratio (ref 0.0–5.0)
Cholesterol, Total: 150 mg/dL (ref 100–199)
HDL: 43 mg/dL (ref >39)
LDL Chol Calc (NIH): 92 mg/dL (ref 0–99)
Triglycerides: 78 mg/dL (ref 0–149)
VLDL Cholesterol Cal: 15 mg/dL (ref 5–40)

## 2019-12-03 LAB — MAGNESIUM: Magnesium: 2.2 mg/dL (ref 1.6–2.3)

## 2019-12-03 LAB — TSH: TSH: 2.1 u[IU]/mL (ref 0.450–4.500)

## 2019-12-03 MED ORDER — SACUBITRIL-VALSARTAN 97-103 MG PO TABS: 1.0000 | ORAL_TABLET | Freq: Two times a day (BID) | ORAL | 11 refills | Status: DC

## 2019-12-03 NOTE — Telephone Encounter (Signed)
Patient aware of recommendations and will pick up increased dose of Entresto 97-103 mg BID. Lab appointment scheduled.  BP cuff at home only reads out ERROR when he tries to check at home.  Will call office if experiences lightheadedness/dizziness.

## 2019-12-03 NOTE — Addendum Note (Signed)
Addended by: Rodman Key on: 12/03/2019 12:19 PM   Modules accepted: Orders

## 2019-12-10 ENCOUNTER — Telehealth: Payer: Self-pay

## 2019-12-10 DIAGNOSIS — E785 Hyperlipidemia, unspecified: Secondary | ICD-10-CM

## 2019-12-10 MED ORDER — ATORVASTATIN CALCIUM 40 MG PO TABS
40.0000 mg | ORAL_TABLET | Freq: Every day | ORAL | 3 refills | Status: DC
Start: 2019-12-10 — End: 2020-09-27

## 2019-12-10 NOTE — Telephone Encounter (Signed)
The patient has been notified of the result and verbalized understanding.  All questions (if any) were answered. Michaelyn Barter, RN 12/10/2019 1:49 PM    Will place order for repeat lab work on 01/28/20 and sent in prescription for lipitor 40 mg daily.

## 2019-12-10 NOTE — Telephone Encounter (Signed)
-----   Message from Dorris Carnes V, MD sent at 12/03/2019  1:55 PM EDT ----- Thyroid function is normal  Electrolytes are OK Kidney function stable Lipids:  Would increase lipitor to 40   F/U lipids in 8 wks  Goal 70 or less

## 2019-12-15 DIAGNOSIS — I10 Essential (primary) hypertension: Secondary | ICD-10-CM | POA: Diagnosis not present

## 2019-12-15 DIAGNOSIS — E039 Hypothyroidism, unspecified: Secondary | ICD-10-CM | POA: Diagnosis not present

## 2019-12-15 DIAGNOSIS — C3490 Malignant neoplasm of unspecified part of unspecified bronchus or lung: Secondary | ICD-10-CM | POA: Diagnosis not present

## 2019-12-15 DIAGNOSIS — I130 Hypertensive heart and chronic kidney disease with heart failure and stage 1 through stage 4 chronic kidney disease, or unspecified chronic kidney disease: Secondary | ICD-10-CM | POA: Diagnosis not present

## 2019-12-15 DIAGNOSIS — R7309 Other abnormal glucose: Secondary | ICD-10-CM | POA: Diagnosis not present

## 2019-12-18 ENCOUNTER — Other Ambulatory Visit: Payer: Medicare Other | Admitting: *Deleted

## 2019-12-18 ENCOUNTER — Other Ambulatory Visit: Payer: Self-pay

## 2019-12-18 DIAGNOSIS — I10 Essential (primary) hypertension: Secondary | ICD-10-CM

## 2019-12-18 DIAGNOSIS — I5022 Chronic systolic (congestive) heart failure: Secondary | ICD-10-CM | POA: Diagnosis not present

## 2019-12-19 LAB — BASIC METABOLIC PANEL
BUN/Creatinine Ratio: 15 (ref 10–24)
BUN: 24 mg/dL (ref 8–27)
CO2: 25 mmol/L (ref 20–29)
Calcium: 9.3 mg/dL (ref 8.6–10.2)
Chloride: 106 mmol/L (ref 96–106)
Creatinine, Ser: 1.65 mg/dL — ABNORMAL HIGH (ref 0.76–1.27)
GFR calc Af Amer: 49 mL/min/{1.73_m2} — ABNORMAL LOW (ref >59)
GFR calc non Af Amer: 42 mL/min/{1.73_m2} — ABNORMAL LOW (ref >59)
Glucose: 102 mg/dL — ABNORMAL HIGH (ref 65–99)
Potassium: 4 mmol/L (ref 3.5–5.2)
Sodium: 144 mmol/L (ref 134–144)

## 2019-12-29 DIAGNOSIS — D649 Anemia, unspecified: Secondary | ICD-10-CM | POA: Diagnosis not present

## 2019-12-29 DIAGNOSIS — E6609 Other obesity due to excess calories: Secondary | ICD-10-CM | POA: Diagnosis not present

## 2019-12-29 DIAGNOSIS — I130 Hypertensive heart and chronic kidney disease with heart failure and stage 1 through stage 4 chronic kidney disease, or unspecified chronic kidney disease: Secondary | ICD-10-CM | POA: Diagnosis not present

## 2019-12-29 DIAGNOSIS — I10 Essential (primary) hypertension: Secondary | ICD-10-CM | POA: Diagnosis not present

## 2019-12-30 ENCOUNTER — Ambulatory Visit (INDEPENDENT_AMBULATORY_CARE_PROVIDER_SITE_OTHER): Payer: Medicare Other

## 2019-12-30 DIAGNOSIS — Z95810 Presence of automatic (implantable) cardiac defibrillator: Secondary | ICD-10-CM

## 2019-12-30 DIAGNOSIS — I5022 Chronic systolic (congestive) heart failure: Secondary | ICD-10-CM

## 2019-12-31 ENCOUNTER — Other Ambulatory Visit: Payer: Self-pay

## 2019-12-31 MED ORDER — SPIRONOLACTONE 25 MG PO TABS
25.0000 mg | ORAL_TABLET | Freq: Every day | ORAL | 0 refills | Status: DC
Start: 2019-12-31 — End: 2020-07-07

## 2019-12-31 NOTE — Progress Notes (Signed)
EPIC Encounter for ICM Monitoring  Patient Name: Jason Russell is a 69 y.o. male Date: 12/31/2019 Primary Care Physican: Lucianne Lei, MD Primary Cardiologist:Ross Electrophysiologist: Caryl Comes 9/29/2021Weight: 486OYO  Spoke with patient and reports feeling well at this time.  Denies fluid symptoms.    CorVueThoracic impedancenormal.    Prescribed: Furosemide40 mg1tablet (40 mg total)daily.  Potassium 20 mEq 1 tablet daily.  Labs: 12/18/2019 Creatinine 1.65, BUN 24, Potassium 4.0, Sodium 144, GFR 42-49 12/01/2019 Creatinine 1.66, BUN 27, Potassium 3.7, Sodium 145, GFR 42-48  10/13/2019 Creatinine 1.90, BUN 25, Potassium 3.7, Sodium 144, GFR 36-41  05/30/2019 Creatinine 1.89, BUN 24, Potassium 3.8, Sodium 143, GFR 36-42 A complete set of results can be found in results review  Recommendations: No changes and encouraged to call if experiencing any fluid symptoms.   Follow-up plan: ICM clinic phone appointment on11/04/2019. 91 day device clinic remote transmission 01/19/2020.   EP/Cardiology Office Visits:01/11/2020 with Dr.Ross.   Copy of ICM check sent to [place].  3 month ICM trend: 12/30/2019    1 Year ICM trend:       [name], RN 12/31/2019 9:31 AM

## 2020-01-03 DIAGNOSIS — Z23 Encounter for immunization: Secondary | ICD-10-CM | POA: Diagnosis not present

## 2020-01-05 ENCOUNTER — Other Ambulatory Visit: Payer: Self-pay

## 2020-01-05 MED ORDER — SACUBITRIL-VALSARTAN 97-103 MG PO TABS: 1.0000 | ORAL_TABLET | Freq: Two times a day (BID) | ORAL | 3 refills | Status: DC

## 2020-01-05 NOTE — Telephone Encounter (Signed)
Pt's medication was sent to pt's pharmacy as requested. Confirmation received.  °

## 2020-01-19 ENCOUNTER — Ambulatory Visit (INDEPENDENT_AMBULATORY_CARE_PROVIDER_SITE_OTHER): Payer: Medicare Other

## 2020-01-19 DIAGNOSIS — I428 Other cardiomyopathies: Secondary | ICD-10-CM

## 2020-01-20 ENCOUNTER — Other Ambulatory Visit: Payer: Self-pay | Admitting: Physician Assistant

## 2020-01-20 LAB — CUP PACEART REMOTE DEVICE CHECK
Battery Remaining Longevity: 86 mo
Battery Remaining Percentage: 80 %
Battery Voltage: 3.01 V
Brady Statistic RV Percent Paced: 1 %
Date Time Interrogation Session: 20211019021125
HighPow Impedance: 36 Ohm
HighPow Impedance: 37 Ohm
Implantable Lead Implant Date: 20140703
Implantable Lead Location: 753860
Implantable Pulse Generator Implant Date: 20191113
Lead Channel Impedance Value: 410 Ohm
Lead Channel Pacing Threshold Amplitude: 0.5 V
Lead Channel Pacing Threshold Pulse Width: 0.5 ms
Lead Channel Sensing Intrinsic Amplitude: 3.8 mV
Lead Channel Setting Pacing Amplitude: 2.5 V
Lead Channel Setting Pacing Pulse Width: 0.5 ms
Lead Channel Setting Sensing Sensitivity: 0.5 mV
Pulse Gen Serial Number: 9820959

## 2020-01-23 ENCOUNTER — Other Ambulatory Visit: Payer: Self-pay | Admitting: Internal Medicine

## 2020-01-23 NOTE — Progress Notes (Signed)
Remote ICD transmission.   

## 2020-01-28 ENCOUNTER — Other Ambulatory Visit: Payer: Medicare Other | Admitting: *Deleted

## 2020-01-28 ENCOUNTER — Other Ambulatory Visit: Payer: Self-pay

## 2020-01-28 DIAGNOSIS — E785 Hyperlipidemia, unspecified: Secondary | ICD-10-CM | POA: Diagnosis not present

## 2020-01-28 LAB — LIPID PANEL
Chol/HDL Ratio: 3.8 ratio (ref 0.0–5.0)
Cholesterol, Total: 117 mg/dL (ref 100–199)
HDL: 31 mg/dL — ABNORMAL LOW (ref >39)
LDL Chol Calc (NIH): 71 mg/dL (ref 0–99)
Triglycerides: 71 mg/dL (ref 0–149)
VLDL Cholesterol Cal: 15 mg/dL (ref 5–40)

## 2020-02-02 ENCOUNTER — Ambulatory Visit (INDEPENDENT_AMBULATORY_CARE_PROVIDER_SITE_OTHER): Payer: Medicare Other

## 2020-02-02 DIAGNOSIS — I5022 Chronic systolic (congestive) heart failure: Secondary | ICD-10-CM | POA: Diagnosis not present

## 2020-02-02 DIAGNOSIS — Z95810 Presence of automatic (implantable) cardiac defibrillator: Secondary | ICD-10-CM

## 2020-02-04 ENCOUNTER — Telehealth: Payer: Self-pay

## 2020-02-04 NOTE — Telephone Encounter (Signed)
Remote ICM transmission received.  Attempted call to patient regarding ICM remote transmission and left detailed message per DPR.  Advised to return call for any fluid symptoms or questions. Next ICM remote transmission scheduled 02/13/2020 to recheck fluid levels.

## 2020-02-04 NOTE — Progress Notes (Signed)
EPIC Encounter for ICM Monitoring  Patient Name: Jason Russell is a 69 y.o. male Date: 02/04/2020 Primary Care Physican: Lucianne Lei, MD Primary Cardiologist:Ross Electrophysiologist: Caryl Comes 9/29/2021Weight: 285lbs  Attempted call to patient and unable to reach.  Left detailed message per DPR regarding transmission. Transmission reviewed.   CorVueThoracic impedancesuggesting possible fluid accumulation starting 01/27/2020.   Prescribed: Furosemide40 mg1tablet (40 mg total)daily.  Potassium 20 mEq 1 tablet daily.  Labs: 12/18/2019 Creatinine 1.65, BUN 24, Potassium 4.0, Sodium 144, GFR 42-49 12/01/2019 Creatinine 1.66, BUN 27, Potassium 3.7, Sodium 145, GFR 42-48  10/13/2019 Creatinine 1.90, BUN 25, Potassium 3.7, Sodium 144, GFR 36-41  05/30/2019 Creatinine 1.89, BUN 24, Potassium 3.8, Sodium 143, GFR 36-42 A complete set of results can be found in results review  Recommendations:Left voice mail with ICM number and encouraged to call if experiencing any fluid symptoms.  Follow-up plan: ICM clinic phone appointment on11/03/2020 to recheck fluid levels. 91 day device clinic remote transmission 04/19/2020.   EP/Cardiology Office Visits:03/12/2020 with Dr.Ross.   Copy of ICM check sent to [place].   3 month ICM trend: 02/02/2020    1 Year ICM trend:       [name], RN 02/04/2020 9:56 AM

## 2020-02-06 ENCOUNTER — Telehealth: Payer: Self-pay | Admitting: Internal Medicine

## 2020-02-06 MED ORDER — FERROUS GLUCONATE 324 (38 FE) MG PO TABS
324.0000 mg | ORAL_TABLET | Freq: Three times a day (TID) | ORAL | 0 refills | Status: DC
Start: 2020-02-06 — End: 2020-03-24

## 2020-02-06 NOTE — Telephone Encounter (Signed)
*  STAT* If patient is at the pharmacy, call can be transferred to refill team.   1. Which medications need to be refilled? (please list name of each medication and dose if known)  ferrous gluconate (FERGON) 324 MG tablet  2. Which pharmacy/location (including street and city if local pharmacy) is medication to be sent to? [id number] - [place], [place] [address]  3. Do they need a 30 day or 90 day supply? 30  Pt is scheduled to see Tommye Standard PA-C on 03/08/20. Please provide enough medication to last until his appointmnt

## 2020-02-06 NOTE — Telephone Encounter (Signed)
Pt's medication was sent to pt's pharmacy as requested. Confirmation received.  °

## 2020-02-10 ENCOUNTER — Other Ambulatory Visit: Payer: Self-pay | Admitting: Internal Medicine

## 2020-02-13 ENCOUNTER — Ambulatory Visit (INDEPENDENT_AMBULATORY_CARE_PROVIDER_SITE_OTHER): Payer: Medicare Other

## 2020-02-13 DIAGNOSIS — Z95810 Presence of automatic (implantable) cardiac defibrillator: Secondary | ICD-10-CM

## 2020-02-13 DIAGNOSIS — I5022 Chronic systolic (congestive) heart failure: Secondary | ICD-10-CM

## 2020-02-13 NOTE — Progress Notes (Signed)
EPIC Encounter for ICM Monitoring  Patient Name: Jason Russell is a 69 y.o. male Date: 02/13/2020 Primary Care Physican: Lucianne Lei, MD Primary Cardiologist:Ross Electrophysiologist: Caryl Comes 9/29/2021Weight: 285lbs  Transmission reviewed.   CorVueThoracic impedancesuggesting fluid levels returned to normal.   Prescribed: Furosemide40 mg1tablet (40 mg total)daily.  Potassium 20 mEq 1 tablet daily.  Labs: 12/18/2019 Creatinine1.65Laurann [place], Potassium4.0, [id number], [id number] 12/01/2019 Creatinine1.66, BUN27, Potassium3.7, [id number], [id number]  10/13/2019 Creatinine1.90, BUN25, Potassium3.7, [id number], [id number] 05/30/2019 Creatinine 1.89, BUN 24, Potassium 3.8, Sodium 143, GFR 36-42 A complete set of results can be found in results review  Recommendations:No changes  Follow-up plan: ICM clinic phone appointment on12/11/2019 to recheck fluid levels. 91 day device clinic remote transmission 04/19/2020.   EP/Cardiology Office Visits:03/08/2020 with Tommye Standard, PA.  03/12/2020 with Dr.Ross.   Copy of ICM check sent to [place].   3 month ICM trend: 02/13/2020    1 Year ICM trend:       [name], RN 02/13/2020 4:11 PM

## 2020-02-24 ENCOUNTER — Other Ambulatory Visit: Payer: Self-pay | Admitting: Internal Medicine

## 2020-02-25 ENCOUNTER — Other Ambulatory Visit: Payer: Self-pay | Admitting: Internal Medicine

## 2020-03-01 DIAGNOSIS — I110 Hypertensive heart disease with heart failure: Secondary | ICD-10-CM | POA: Diagnosis not present

## 2020-03-01 DIAGNOSIS — C3410 Malignant neoplasm of upper lobe, unspecified bronchus or lung: Secondary | ICD-10-CM | POA: Diagnosis not present

## 2020-03-01 DIAGNOSIS — I130 Hypertensive heart and chronic kidney disease with heart failure and stage 1 through stage 4 chronic kidney disease, or unspecified chronic kidney disease: Secondary | ICD-10-CM | POA: Diagnosis not present

## 2020-03-01 DIAGNOSIS — R7309 Other abnormal glucose: Secondary | ICD-10-CM | POA: Diagnosis not present

## 2020-03-01 DIAGNOSIS — E039 Hypothyroidism, unspecified: Secondary | ICD-10-CM | POA: Diagnosis not present

## 2020-03-01 DIAGNOSIS — I10 Essential (primary) hypertension: Secondary | ICD-10-CM | POA: Diagnosis not present

## 2020-03-01 DIAGNOSIS — R635 Abnormal weight gain: Secondary | ICD-10-CM | POA: Diagnosis not present

## 2020-03-04 ENCOUNTER — Other Ambulatory Visit: Payer: Self-pay | Admitting: Internal Medicine

## 2020-03-05 ENCOUNTER — Other Ambulatory Visit: Payer: Self-pay | Admitting: Internal Medicine

## 2020-03-07 NOTE — Progress Notes (Signed)
Cardiology Office Note Date:  03/07/2020  Patient ID:  Kymere Fullington, [name] November 19, 1950, MRN [id number] PCP:  Lucianne Lei, MD  Cardiologist:  Dr. Harrington Challenger Electrophysiologist: Dr. Caryl Comes    Chief Complaint:   annual visit  History of Present Illness: Mina Carlisi is a 69 y.o. 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   He was hospitalized 10/8-10/9/18 with rapid unintentional weight loss and ICD therapy.  He reported that his wife had fallen and he was helping her up when he was shocked by his device.  No reports of symptoms associated with it.  The patient was evaluated and found to be hypokalemic and low mag as well and felt this contributed to his arhythmia, by interrogation was VT.  He also reported that he had completed his chemo and radiation was doing OK when they started immunotherapy after his first dose, became weak, anorexic and reported 20lb weight loss in 2 weeks.  The patient's electrolytes were replaced his coreg was increased and discharged home.  He comes in today to be seen for Dr. Caryl Comes, last seen by him Aug 2020.  He mentions RV pacing threshold chronically low (?), , RBBB.  Discussed no AFib and his amiodarone was reduced with plans for further reduction (to 700g/week) at his next annual visit if none again.  More recently saw Dr. Harrington Challenger Aug 2021, was doing OK, entresto was titrated up.  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 He denies any difficulties with his ADLs, no SOB, DOE. No CP, palpitations or cardiac awareness. No dizzy spells, near syncope or syncope. He reports compliacne with his medicines, discussed his xarelto specifically, says he never misses. No bleeding or signs of bleeding.     Device information: SJM single chamber ICD, implanted 10/03/12 > gen change Nov 2019 for early gen  depletion + hx of appropriate tx Oct 2018 for VT AAD Tx:  Amiodarone (goes as far back as his Epic chart (2013) seems for his AF   Past Medical History:  Diagnosis Date  . Adenocarcinoma of right lung, stage 3 ([place]) 08/23/2016  . Anemia   . Arthritis    "hx right hip"  . Asthma    "when I was a child"  . Atrial fibrillation ([facility])    Amiodarone started 10/2011; Coumadin  . 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 St. Jude ICD implantation 10/03/12.  . Chronic anticoagulation   . Chronic fatigue 10/18/2016  . Chronic systolic heart failure ([place])    a. Echo 7/13: EF 25%;  b. echo 04/2012:  Mild LVH, EF 30-35%, Gr 1 DD, mild AI, mild MR, mild LAE  . COPD (chronic obstructive pulmonary disease) ([place])   . Dyslipidemia   . Gout   . History of blood transfusion 10/15/2013   "don't know where the blood's going; HgB down to 5"  . Hyperlipidemia   . Hypertension   . Hypothyroidism   . NICM (nonischemic cardiomyopathy) ([place])    [place] 4/14:  minimal CAD  . Obesity   . OSA on CPAP   . Tobacco abuse     Past Surgical History:  Procedure Laterality Date  . CARDIAC DEFIBRILLATOR PLACEMENT  2014  . CARDIOVERSION  2011  . COLONOSCOPY WITH PROPOFOL Left 10/17/2013   Procedure: COLONOSCOPY WITH PROPOFOL;  Surgeon: Inda Castle, MD;  Location: [facility];  Service: Endoscopy;  Laterality: Left;  . ESOPHAGOGASTRODUODENOSCOPY N/A 10/17/2013   Procedure: ESOPHAGOGASTRODUODENOSCOPY (EGD);  Surgeon: Inda Castle, MD;  Location: [facility];  Service: Endoscopy;  Laterality: N/A;  . ESOPHAGOGASTRODUODENOSCOPY (EGD) WITH PROPOFOL N/A 06/14/2018   Procedure: ESOPHAGOGASTRODUODENOSCOPY (EGD) WITH PROPOFOL;  Surgeon: Doran Stabler, MD;  Location: [facility];  Service: Gastroenterology;  Laterality: N/A;  . GIVENS CAPSULE STUDY N/A 10/29/2013   Procedure: GIVENS CAPSULE STUDY;  Surgeon: Inda Castle, MD;  Location: [facility];  Service: Endoscopy;  Laterality:  N/A;  . ICD GENERATOR CHANGEOUT N/A 02/13/2018   Procedure: ICD GENERATOR CHANGEOUT;  Surgeon: Constance Haw, MD;  Location: [facility];  Service: Cardiovascular;  Laterality: N/A;  . IMPLANTABLE CARDIOVERTER DEFIBRILLATOR IMPLANT Left 10/03/2012   Procedure: IMPLANTABLE CARDIOVERTER DEFIBRILLATOR IMPLANT;  Surgeon: Deboraha Sprang, MD;  [facility];  Service: Cardiovascular;  Laterality: Left;  . IR FLUORO GUIDE PORT INSERTION RIGHT  09/21/2016  . IR US GUIDE VASC ACCESS RIGHT  09/21/2016  . JOINT REPLACEMENT    . SAVORY DILATION N/A 06/14/2018   Procedure: SAVORY DILATION;  Surgeon: Doran Stabler, MD;  Location: [facility];  Service: Gastroenterology;  Laterality: N/A;  . TONSILLECTOMY  1950's  . TOTAL HIP ARTHROPLASTY Right 11/25/1997  . VIDEO BRONCHOSCOPY WITH ENDOBRONCHIAL ULTRASOUND N/A 08/09/2016   Procedure: VIDEO BRONCHOSCOPY WITH ENDOBRONCHIAL ULTRASOUND;  Surgeon: Javier Glazier, MD;  Location: [facility];  Service: Thoracic;  Laterality: N/A;    Current Outpatient Medications  Medication Sig Dispense Refill  . acetaminophen (TYLENOL) 500 MG tablet Take 1,000 mg by mouth every 6 (six) hours as needed for mild pain.    [name] albuterol (VENTOLIN HFA) 108 (90 Base) MCG/ACT inhaler INHALE 2 PUFFS INTO THE LUNGS EVERY 4 (FOUR) HOURS AS NEEDED FOR WHEEZING OR SHORTNESS OF BREATH 18 g 2  . amiodarone (PACERONE) 100 MG tablet Take 1 tablet 5 days a week. 60 tablet 3  . atorvastatin (LIPITOR) 40 MG tablet Take 1 tablet (40 mg total) by mouth daily. 90 tablet 3  . carvedilol (COREG) 12.5 MG tablet TAKE 1 TABLET BY MOUTH TWICE A DAY 180 tablet 3  . clotrimazole-betamethasone (LOTRISONE) cream Apply topically.    . [employer] D3 2000 units CAPS Take 2,000 Units by mouth daily.   11  . feeding supplement, ENSURE ENLIVE, (ENSURE ENLIVE) LIQD Take 237 mLs by mouth 2 (two) times daily between meals. 237 mL 12  . ferrous gluconate (FERGON) 324 MG tablet Take 1 tablet (324 mg total) by  mouth 3 (three) times daily with meals. Please keep upcoming appt in December before anymore refills. Thank you 90 tablet 0  . Fluticasone-Umeclidin-Vilant 100-62.5-25 MCG/INH AEPB TAKE 1 PUFF BY MOUTH EVERY DAY 180 each 11  . furosemide (LASIX) 40 MG tablet Take 1 tablet (40 mg total) by mouth daily. Please keep upcoming appt in December for future refills. Thank you 30 tablet 0  . hydrALAZINE (APRESOLINE) 100 MG tablet TAKE 1 TABLET BY MOUTH THREE TIMES A DAY 270 tablet 2  . levothyroxine (SYNTHROID) 50 MCG tablet TAKE 1 TABLET BY MOUTH DAILY BEFORE BREAKFAST 90 tablet 2  . lidocaine-prilocaine (EMLA) cream APPLY GENEROUS AMOUNT TO PORT SITE AT LEAST 1 HR PRIOR TO TREATMENT. DO NOT RUB IN. 30 g 1  . magnesium oxide (MAG-OX) 400 (241.3 Mg) MG tablet Take 1 tablet (400 mg total) by mouth daily. 30 tablet 0  . Polyvinyl Alcohol-Povidone (REFRESH OP) Place  1 drop into both eyes every morning.    . Potassium Chloride ER 20 MEQ TBCR TAKE 1 TABLET BY MOUTH EVERY DAY 90 tablet 2  . sacubitril-valsartan (ENTRESTO) 97-103 MG Take 1 tablet by mouth 2 (two) times daily. 180 tablet 3  . sildenafil (VIAGRA) 100 MG tablet Take 1 tablet (100 mg total) by mouth daily as needed for erectile dysfunction. 10 tablet 1  . spironolactone (ALDACTONE) 25 MG tablet Take 1 tablet (25 mg total) by mouth daily. Please make overdue appt with Dr. Caryl Comes before anymore refills. 1st attempt 30 tablet 0  . ULORIC 40 MG tablet Take 1 tablet (40 mg total) by mouth daily. 30 tablet 0  . XARELTO 20 MG TABS tablet TAKE 1 TABLET BY MOUTH EVERY DAY 90 tablet 1   No current facility-administered medications for this visit.    Allergies:   Patient has no known allergies.   Social History:  The patient  reports that he quit smoking about 3 years ago. His smoking use included cigarettes. He has a 11.25 pack-year smoking history. He has never used smokeless tobacco. He reports current alcohol use of about 1.0 standard drink of alcohol per  week. He reports that he does not use drugs.   Family History:  The patient's family history includes Heart Problems in his mother; Hypertension in his mother; Lung cancer in his brother; Other in his father.  ROS:  Please see the history of present illness.  All other systems are reviewed and otherwise negative.   PHYSICAL EXAM:  VS:  There were no vitals taken for this visit. BMI: There is no height or weight on file to calculate BMI. Well nourished, well developed, in no acute distress  HEENT: normocephalic, atraumatic  Neck: no JVD, carotid bruits or masses Cardiac: RRR; no significant murmurs, no rubs, or gallops Lungs:   CTA b/l, no wheezing, rhonchi or rales  Abd: soft, non-tender, obese MS: no deformity or atrophy Ext: trace edema  Skin: warm and dry, no rash Neuro:  No gross deficits appreciated Psych: euthymic mood, full affect   ICD site is stable, no tethering or discomfort   EKG:   Not done today  ICD interrogation done today and reviewed by myself:  Battery and lead measurements are good No VT VP <1%  05/24/2018: TTE IMPRESSIONS  1. The left ventricle has moderately reduced systolic function, with an  ejection fraction of 35-40%. The cavity size was normal. There is mildly  increased left ventricular wall thickness. Left ventricular diastolic  Doppler parameters are consistent with  impaired relaxation.  2. The right ventricle has mildly reduced systolic function. The cavity  was small. There is no increase in right ventricular wall thickness.  3. The mitral valve is normal in structure. Mild thickening of the mitral  valve leaflet.  4. The tricuspid valve is normal in structure.  5. The aortic valve is normal in structure. Aortic valve regurgitation is  mild to moderate by color flow Doppler.  6. The interatrial septum was not assessed.   12/28/14: TTE Study Conclusions - Left ventricle: The cavity size was moderately dilated. There was   mild  concentric hypertrophy. Systolic function was moderately to   severely reduced. The estimated ejection fraction was in the   range of 30% to 35%. Moderate diffuse hypokinesis with no   identifiable regional variations. Features are consistent with a   pseudonormal left ventricular filling pattern, with concomitant   abnormal relaxation and increased filling pressure (grade  2   diastolic dysfunction). - Aortic valve: There was mild to moderate regurgitation. - Left atrium: The atrium was moderately dilated. - Right ventricle: The cavity size was mildly dilated. Wall   thickness was normal. - Atrial septum: There was an atrial septal aneurysm. - Pulmonary arteries: Systolic pressure was mildly increased. PA   peak pressure: 37 mm Hg (S).   Recent Labs: 10/13/2019: ALT 14; Hemoglobin 12.3; Platelet Count 155 12/01/2019: Magnesium 2.2; TSH 2.100 12/18/2019: BUN 24; Creatinine, Ser 1.65; Potassium 4.0; Sodium 144  01/28/2020: Chol/HDL Ratio 3.8; Cholesterol, Total 117; HDL 31; LDL Chol Calc (NIH) 71; Triglycerides 71   CrCl cannot be calculated (Patient's most recent lab result is older than the maximum 21 days allowed.).   Wt Readings from Last 3 Encounters:  12/01/19 (!) 306 lb (138.8 kg)  12/01/19 (!) 305 lb (138.3 kg)  10/20/19 (!) 302 lb 11.2 oz (137.3 kg)     Other studies reviewed: Additional studies/records reviewed today include: summarized above  ASSESSMENT AND PLAN:  1. ICD     Intact function, no programming changes made  2. VT     None noted by today's device interrogation  3. NICM     Chronic CHF (systolic)     LVEF 62-83% by last echo     On good meds     No symptoms of volume OL     Trace edema, wearing support stockings     Weight is stable, [place] is trending upwards     C/w dr. Harrington Challenger  4. HTN     Looks OK, no changes  5. Paroxysmal Afib     CHA2DS2Vasc is at least 3, on xarelto, appropriately dosed     Low dose amio     labs are up to date  He is in  Afib today.  He was unaware from a symptom perspective, though after more thought thinks perhaps not quite as energetic as usual. Discussed AFib management strategies, recommend we increase his amiodarone to 200mg  daiy (he says he is doing 100mg  every day , not 5 days week) and plan DCCV in a week or 2 after the increased amiodarone.  We discussed DCCV procedure, potential risks and benefits, he is agreeable He is certain he has not missed any doses of his xarelto, and we discussed the importance of this especially leading to and after his DCCV    Disposition: I will see him back 6 weeks post DCCV, sooner if needed    Current medicines are reviewed at length with the patient today.  The patient did not have any concerns regarding medicines.  Venetia Night, PA-C 03/07/2020 9:09 AM     [address] [place] [id number] [phone] (office)  [phone] (fax)

## 2020-03-07 NOTE — H&P (View-Only) (Signed)
Cardiology Office Note Date:  03/07/2020  Patient ID:  Jason Russell, Jason Russell [birth year], MRN [id number] PCP:  Lucianne Lei, MD  Cardiologist:  Dr. Harrington Challenger Electrophysiologist: Dr. Caryl Comes    Chief Complaint:   annual visit  History of Present Illness: Jason Russell is a 69 y.o. 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   He was hospitalized 10/8-10/9/18 with rapid unintentional weight loss and ICD therapy.  He reported that his wife had fallen and he was helping her up when he was shocked by his device.  No reports of symptoms associated with it.  The patient was evaluated and found to be hypokalemic and low mag as well and felt this contributed to his arhythmia, by interrogation was VT.  He also reported that he had completed his chemo and radiation was doing OK when they started immunotherapy after his first dose, became weak, anorexic and reported 20lb weight loss in 2 weeks.  The patient's electrolytes were replaced his coreg was increased and discharged home.  He comes in today to be seen for Dr. Caryl Comes, last seen by him Aug 2020.  He mentions RV pacing threshold chronically low (?), , RBBB.  Discussed no AFib and his amiodarone was reduced with plans for further reduction (to 700g/week) at his next annual visit if none again.  More recently saw Dr. Harrington Challenger Aug 2021, was doing OK, entresto was titrated up.  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 He denies any difficulties with his ADLs, no SOB, DOE. No CP, palpitations or cardiac awareness. No dizzy spells, near syncope or syncope. He reports compliacne with his medicines, discussed his xarelto specifically, says he never misses. No bleeding or signs of bleeding.     Device information: SJM single chamber ICD, implanted 10/03/12 > gen change Nov 2019 for early gen  depletion + hx of appropriate tx Oct 2018 for VT AAD Tx:  Amiodarone (goes as far back as his Epic chart (2013) seems for his AF   Past Medical History:  Diagnosis Date  . Adenocarcinoma of right lung, stage 3 ([place]) 08/23/2016  . Anemia   . Arthritis    "hx right hip"  . Asthma    "when I was a child"  . Atrial fibrillation (HCC)    Amiodarone started 10/2011; Coumadin  . 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 St. Jude ICD implantation 10/03/12.  . Chronic anticoagulation   . Chronic fatigue 10/18/2016  . Chronic systolic heart failure ([place])    a. Echo 7/13: EF 25%;  b. echo 04/2012:  Mild LVH, EF 30-35%, Gr 1 DD, mild AI, mild MR, mild LAE  . COPD (chronic obstructive pulmonary disease) ([place])   . Dyslipidemia   . Gout   . History of blood transfusion 10/15/2013   "don't know where the blood's going; HgB down to 5"  . Hyperlipidemia   . Hypertension   . Hypothyroidism   . NICM (nonischemic cardiomyopathy) ([place])    [place] 4/14:  minimal CAD  . Obesity   . OSA on CPAP   . Tobacco abuse     Past Surgical History:  Procedure Laterality Date  . CARDIAC DEFIBRILLATOR PLACEMENT  2014  . CARDIOVERSION  2011  . COLONOSCOPY WITH PROPOFOL Left 10/17/2013   Procedure: COLONOSCOPY WITH PROPOFOL;  Surgeon: Inda Castle, MD;  Location: [facility];  Service: Endoscopy;  Laterality: Left;  . ESOPHAGOGASTRODUODENOSCOPY N/A 10/17/2013   Procedure: ESOPHAGOGASTRODUODENOSCOPY (EGD);  Surgeon: Inda Castle, MD;  Location: [facility];  Service: Endoscopy;  Laterality: N/A;  . ESOPHAGOGASTRODUODENOSCOPY (EGD) WITH PROPOFOL N/A 06/14/2018   Procedure: ESOPHAGOGASTRODUODENOSCOPY (EGD) WITH PROPOFOL;  Surgeon: Doran Stabler, MD;  Location: [facility];  Service: Gastroenterology;  Laterality: N/A;  . GIVENS CAPSULE STUDY N/A 10/29/2013   Procedure: GIVENS CAPSULE STUDY;  Surgeon: Inda Castle, MD;  Location: [facility];  Service: Endoscopy;  Laterality:  N/A;  . ICD GENERATOR CHANGEOUT N/A 02/13/2018   Procedure: ICD GENERATOR CHANGEOUT;  Surgeon: Constance Haw, MD;  Location: [facility];  Service: Cardiovascular;  Laterality: N/A;  . IMPLANTABLE CARDIOVERTER DEFIBRILLATOR IMPLANT Left 10/03/2012   Procedure: IMPLANTABLE CARDIOVERTER DEFIBRILLATOR IMPLANT;  Surgeon: Deboraha Sprang, MD;  Location: [facility]ATH LAB;  Service: Cardiovascular;  Laterality: Left;  . IR FLUORO GUIDE PORT INSERTION RIGHT  09/21/2016  . IR US GUIDE VASC ACCESS RIGHT  09/21/2016  . JOINT REPLACEMENT    . SAVORY DILATION N/A 06/14/2018   Procedure: SAVORY DILATION;  Surgeon: Doran Stabler, MD;  Location: [facility];  Service: Gastroenterology;  Laterality: N/A;  . TONSILLECTOMY  1950's  . TOTAL HIP ARTHROPLASTY Right 11/25/1997  . VIDEO BRONCHOSCOPY WITH ENDOBRONCHIAL ULTRASOUND N/A 08/09/2016   Procedure: VIDEO BRONCHOSCOPY WITH ENDOBRONCHIAL ULTRASOUND;  Surgeon: Javier Glazier, MD;  Location: [facility] OR;  Service: Thoracic;  Laterality: N/A;    Current Outpatient Medications  Medication Sig Dispense Refill  . acetaminophen (TYLENOL) 500 MG tablet Take 1,000 mg by mouth every 6 (six) hours as needed for mild pain.    [name] albuterol (VENTOLIN HFA) 108 (90 Base) MCG/ACT inhaler INHALE 2 PUFFS INTO THE LUNGS EVERY 4 (FOUR) HOURS AS NEEDED FOR WHEEZING OR SHORTNESS OF BREATH 18 g 2  . amiodarone (PACERONE) 100 MG tablet Take 1 tablet 5 days a week. 60 tablet 3  . atorvastatin (LIPITOR) 40 MG tablet Take 1 tablet (40 mg total) by mouth daily. 90 tablet 3  . carvedilol (COREG) 12.5 MG tablet TAKE 1 TABLET BY MOUTH TWICE A DAY 180 tablet 3  . clotrimazole-betamethasone (LOTRISONE) cream Apply topically.    . [employer] D3 2000 units CAPS Take 2,000 Units by mouth daily.   11  . feeding supplement, ENSURE ENLIVE, (ENSURE ENLIVE) LIQD Take 237 mLs by mouth 2 (two) times daily between meals. 237 mL 12  . ferrous gluconate (FERGON) 324 MG tablet Take 1 tablet (324 mg total) by  mouth 3 (three) times daily with meals. Please keep upcoming appt in December before anymore refills. Thank you 90 tablet 0  . Fluticasone-Umeclidin-Vilant 100-62.5-25 MCG/INH AEPB TAKE 1 PUFF BY MOUTH EVERY DAY 180 each 11  . furosemide (LASIX) 40 MG tablet Take 1 tablet (40 mg total) by mouth daily. Please keep upcoming appt in December for future refills. Thank you 30 tablet 0  . hydrALAZINE (APRESOLINE) 100 MG tablet TAKE 1 TABLET BY MOUTH THREE TIMES A DAY 270 tablet 2  . levothyroxine (SYNTHROID) 50 MCG tablet TAKE 1 TABLET BY MOUTH DAILY BEFORE BREAKFAST 90 tablet 2  . lidocaine-prilocaine (EMLA) cream APPLY GENEROUS AMOUNT TO PORT SITE AT LEAST 1 HR PRIOR TO TREATMENT. DO NOT RUB IN. 30 g 1  . magnesium oxide (MAG-OX) 400 (241.3 Mg) MG tablet Take 1 tablet (400 mg total) by mouth daily. 30 tablet 0  . Polyvinyl Alcohol-Povidone (REFRESH OP) Place  1 drop into both eyes every morning.    . Potassium Chloride ER 20 MEQ TBCR TAKE 1 TABLET BY MOUTH EVERY DAY 90 tablet 2  . sacubitril-valsartan (ENTRESTO) 97-103 MG Take 1 tablet by mouth 2 (two) times daily. 180 tablet 3  . sildenafil (VIAGRA) 100 MG tablet Take 1 tablet (100 mg total) by mouth daily as needed for erectile dysfunction. 10 tablet 1  . spironolactone (ALDACTONE) 25 MG tablet Take 1 tablet (25 mg total) by mouth daily. Please make overdue appt with Dr. Caryl Comes before anymore refills. 1st attempt 30 tablet 0  . ULORIC 40 MG tablet Take 1 tablet (40 mg total) by mouth daily. 30 tablet 0  . XARELTO 20 MG TABS tablet TAKE 1 TABLET BY MOUTH EVERY DAY 90 tablet 1   No current facility-administered medications for this visit.    Allergies:   Patient has no known allergies.   Social History:  The patient  reports that he quit smoking about 3 years ago. His smoking use included cigarettes. He has a 11.25 pack-year smoking history. He has never used smokeless tobacco. He reports current alcohol use of about 1.0 standard drink of alcohol per  week. He reports that he does not use drugs.   Family History:  The patient's family history includes Heart Problems in his mother; Hypertension in his mother; Lung cancer in his brother; Other in his father.  ROS:  Please see the history of present illness.  All other systems are reviewed and otherwise negative.   PHYSICAL EXAM:  VS:  There were no vitals taken for this visit. BMI: There is no height or weight on file to calculate BMI. Well nourished, well developed, in no acute distress  HEENT: normocephalic, atraumatic  Neck: no JVD, carotid bruits or masses Cardiac: RRR; no significant murmurs, no rubs, or gallops Lungs:   CTA b/l, no wheezing, rhonchi or rales  Abd: soft, non-tender, obese MS: no deformity or atrophy Ext: trace edema  Skin: warm and dry, no rash Neuro:  No gross deficits appreciated Psych: euthymic mood, full affect   ICD site is stable, no tethering or discomfort   EKG:   Not done today  ICD interrogation done today and reviewed by myself:  Battery and lead measurements are good No VT VP <1%  05/24/2018: TTE IMPRESSIONS  1. The left ventricle has moderately reduced systolic function, with an  ejection fraction of 35-40%. The cavity size was normal. There is mildly  increased left ventricular wall thickness. Left ventricular diastolic  Doppler parameters are consistent with  impaired relaxation.  2. The right ventricle has mildly reduced systolic function. The cavity  was small. There is no increase in right ventricular wall thickness.  3. The mitral valve is normal in structure. Mild thickening of the mitral  valve leaflet.  4. The tricuspid valve is normal in structure.  5. The aortic valve is normal in structure. Aortic valve regurgitation is  mild to moderate by color flow Doppler.  6. The interatrial septum was not assessed.   12/28/14: TTE Study Conclusions - Left ventricle: The cavity size was moderately dilated. There was   mild  concentric hypertrophy. Systolic function was moderately to   severely reduced. The estimated ejection fraction was in the   range of 30% to 35%. Moderate diffuse hypokinesis with no   identifiable regional variations. Features are consistent with a   pseudonormal left ventricular filling pattern, with concomitant   abnormal relaxation and increased filling pressure (grade  2   diastolic dysfunction). - Aortic valve: There was mild to moderate regurgitation. - Left atrium: The atrium was moderately dilated. - Right ventricle: The cavity size was mildly dilated. Wall   thickness was normal. - Atrial septum: There was an atrial septal aneurysm. - Pulmonary arteries: Systolic pressure was mildly increased. PA   peak pressure: 37 mm Hg (S).   Recent Labs: 10/13/2019: ALT 14; Hemoglobin 12.3; Platelet Count 155 12/01/2019: Magnesium 2.2; TSH 2.100 12/18/2019: BUN 24; Creatinine, Ser 1.65; Potassium 4.0; Sodium 144  01/28/2020: Chol/HDL Ratio 3.8; Cholesterol, Total 117; HDL 31; LDL Chol Calc (NIH) 71; Triglycerides 71   CrCl cannot be calculated (Patient's most recent lab result is older than the maximum 21 days allowed.).   Wt Readings from Last 3 Encounters:  12/01/19 (!) 306 lb (138.8 kg)  12/01/19 (!) 305 lb (138.3 kg)  10/20/19 (!) 302 lb 11.2 oz (137.3 kg)     Other studies reviewed: Additional studies/records reviewed today include: summarized above  ASSESSMENT AND PLAN:  1. ICD     Intact function, no programming changes made  2. VT     None noted by today's device interrogation  3. NICM     Chronic CHF (systolic)     LVEF 08-67% by last echo     On good meds     No symptoms of volume OL     Trace edema, wearing support stockings     Weight is stable, corview is trending upwards     C/w dr. Harrington Challenger  4. HTN     Looks OK, no changes  5. Paroxysmal Afib     CHA2DS2Vasc is at least 3, on xarelto, appropriately dosed     Low dose amio     labs are up to date  He is in  Afib today.  He was unaware from a symptom perspective, though after more thought thinks perhaps not quite as energetic as usual. Discussed AFib management strategies, recommend we increase his amiodarone to 200mg  daiy (he says he is doing 100mg  every day , not 5 days week) and plan DCCV in a week or 2 after the increased amiodarone.  We discussed DCCV procedure, potential risks and benefits, he is agreeable He is certain he has not missed any doses of his xarelto, and we discussed the importance of this especially leading to and after his DCCV    Disposition: I will see him back 6 weeks post DCCV, sooner if needed    Current medicines are reviewed at length with the patient today.  The patient did not have any concerns regarding medicines.  Venetia Night, PA-C 03/07/2020 9:09 AM     [address] [place]  [phone] (office)  [phone] (fax)

## 2020-03-08 ENCOUNTER — Encounter: Payer: Self-pay | Admitting: *Deleted

## 2020-03-08 ENCOUNTER — Other Ambulatory Visit: Payer: Self-pay

## 2020-03-08 ENCOUNTER — Ambulatory Visit (INDEPENDENT_AMBULATORY_CARE_PROVIDER_SITE_OTHER): Payer: Medicare Other | Admitting: Physician Assistant

## 2020-03-08 ENCOUNTER — Encounter: Payer: Self-pay | Admitting: Physician Assistant

## 2020-03-08 VITALS — BP 130/80 | HR 100 | Ht 73.0 in | Wt 303.0 lb

## 2020-03-08 DIAGNOSIS — Z95810 Presence of automatic (implantable) cardiac defibrillator: Secondary | ICD-10-CM

## 2020-03-08 DIAGNOSIS — I472 Ventricular tachycardia, unspecified: Secondary | ICD-10-CM

## 2020-03-08 DIAGNOSIS — I480 Paroxysmal atrial fibrillation: Secondary | ICD-10-CM | POA: Diagnosis not present

## 2020-03-08 DIAGNOSIS — I10 Essential (primary) hypertension: Secondary | ICD-10-CM

## 2020-03-08 DIAGNOSIS — I428 Other cardiomyopathies: Secondary | ICD-10-CM

## 2020-03-08 DIAGNOSIS — I5022 Chronic systolic (congestive) heart failure: Secondary | ICD-10-CM

## 2020-03-08 MED ORDER — AMIODARONE HCL 200 MG PO TABS: 200.0000 mg | ORAL_TABLET | Freq: Every day | ORAL | 1 refills | Status: DC

## 2020-03-08 NOTE — Patient Instructions (Addendum)
Medication Instructions:    START TAKING AMIODARONE 200 MG ONCE A DAY   *If you need a refill on your cardiac medications before your next appointment, please call your pharmacy*   Lab Work:  CBC AND BMET TODAY   COVID SCREENING( DIRECTIONS/MAP GIVEN ) DATE : 03-11-20 TIME: 9:30am  If you have labs (blood work) drawn today and your tests are completely normal, you will receive your results only by: [name] MyChart Message (if you have MyChart) OR . A paper copy in the mail If you have any lab test that is abnormal or we need to change your treatment, we will call you to review the results.   Testing/Procedures:SEE LETTER. Your physician has recommended that you have a Cardioversion (DCCV). Electrical Cardioversion uses a jolt of electricity to your heart either through paddles or wired patches attached to your chest. This is a controlled, usually prescheduled, procedure. Defibrillation is done under light anesthesia in the hospital, and you usually go home the day of the procedure. This is done to get your heart back into a normal rhythm. You are not awake for the procedure. Please see the instruction sheet given to you today.   Follow-Up: At [facility], you and your health needs are our priority.  As part of our continuing mission to provide you with exceptional heart care, we have created designated Provider Care Teams.  These Care Teams include your primary Cardiologist (physician) and Advanced Practice Providers (APPs -  Physician Assistants and Nurse Practitioners) who all work together to provide you with the care you need, when you need it.  We recommend signing up for the patient portal called "MyChart".  Sign up information is provided on this After Visit Summary.  MyChart is used to connect with patients for Virtual Visits (Telemedicine).  Patients are able to view lab/test results, encounter notes, upcoming appointments, etc.  Non-urgent messages can be sent to your provider as well.    To learn more about what you can do with MyChart, go to [email or web address].    Your next appointment:   6-8  week(s) after 03-15-20 cardioversion follow up   The format for your next appointment:   In Person  Provider:   You will see one of the following Advanced Practice Providers on your designated Care Team:    Tommye Standard, [place]    Other Instructions

## 2020-03-09 LAB — CBC
Hematocrit: 42.7 % (ref 37.5–51.0)
Hemoglobin: 13.9 g/dL (ref 13.0–17.7)
MCH: 29.4 pg (ref 26.6–33.0)
MCHC: 32.6 g/dL (ref 31.5–35.7)
MCV: 91 fL (ref 79–97)
Platelets: 179 10*3/uL (ref 150–450)
RBC: 4.72 x10E6/uL (ref 4.14–5.80)
RDW: 13 % (ref 11.6–15.4)
WBC: 4.6 10*3/uL (ref 3.4–10.8)

## 2020-03-09 LAB — BASIC METABOLIC PANEL
BUN/Creatinine Ratio: 16 (ref 10–24)
BUN: 27 mg/dL (ref 8–27)
CO2: 26 mmol/L (ref 20–29)
Calcium: 9.5 mg/dL (ref 8.6–10.2)
Chloride: 107 mmol/L — ABNORMAL HIGH (ref 96–106)
Creatinine, Ser: 1.66 mg/dL — ABNORMAL HIGH (ref 0.76–1.27)
GFR calc Af Amer: 48 mL/min/{1.73_m2} — ABNORMAL LOW (ref >59)
GFR calc non Af Amer: 42 mL/min/{1.73_m2} — ABNORMAL LOW (ref >59)
Glucose: 99 mg/dL (ref 65–99)
Potassium: 4.4 mmol/L (ref 3.5–5.2)
Sodium: 144 mmol/L (ref 134–144)

## 2020-03-10 ENCOUNTER — Ambulatory Visit (INDEPENDENT_AMBULATORY_CARE_PROVIDER_SITE_OTHER): Payer: Medicare Other

## 2020-03-10 DIAGNOSIS — Z95810 Presence of automatic (implantable) cardiac defibrillator: Secondary | ICD-10-CM

## 2020-03-10 DIAGNOSIS — I5022 Chronic systolic (congestive) heart failure: Secondary | ICD-10-CM | POA: Diagnosis not present

## 2020-03-11 ENCOUNTER — Other Ambulatory Visit (HOSPITAL_COMMUNITY)
Admission: RE | Admit: 2020-03-11 | Discharge: 2020-03-11 | Disposition: A | Discharge status: Home / Self Care | Payer: Medicare Other | Source: Ambulatory Visit | Attending: Internal Medicine | Admitting: Internal Medicine

## 2020-03-11 DIAGNOSIS — Z20822 Contact with and (suspected) exposure to covid-19: Secondary | ICD-10-CM | POA: Diagnosis not present

## 2020-03-11 DIAGNOSIS — Z01812 Encounter for preprocedural laboratory examination: Secondary | ICD-10-CM | POA: Insufficient documentation

## 2020-03-11 LAB — SARS CORONAVIRUS 2 (TAT 6-24 HRS): SARS Coronavirus 2: NEGATIVE

## 2020-03-11 NOTE — Progress Notes (Deleted)
Pt not seen in clinic on 03/11/20    Initial prechart entered/signed erroneously.  Dorris Carnes MD

## 2020-03-12 ENCOUNTER — Ambulatory Visit: Payer: Medicare Other | Admitting: Internal Medicine

## 2020-03-12 NOTE — Progress Notes (Signed)
EPIC Encounter for ICM Monitoring  Patient Name: Jason Russell is a 69 y.o. male Date: 03/12/2020 Primary Care Physican: Lucianne Lei, MD Primary Cardiologist:Ross Electrophysiologist: Caryl Comes 12/10/2021Weight: 295lbs  Spoke with patient and reports feeling well at this time.  Denies fluid symptoms.  Cardioversion for AFib scheduled for 03/15/2020.  CorVueThoracic impedancesuggesting fluid levels returned to normal.   Prescribed:  Furosemide40 mg1tablet (40 mg total)daily.   Potassium 20 mEq 1 tablet daily.  Labs: 12/18/2019 Creatinine1.65Laurann [place], Potassium4.0, [id number], [id number] 12/01/2019 Creatinine1.66, BUN27, Potassium3.7, [id number], [id number]  10/13/2019 Creatinine1.90, BUN25, Potassium3.7, [id number], [id number] 05/30/2019 Creatinine 1.89, BUN 24, Potassium 3.8, Sodium 143, GFR 36-42 A complete set of results can be found in results review  Recommendations:No changes and encouraged to call if experiencing any fluid symptoms.  Follow-up plan: ICM clinic phone appointment on1/01/2021. 91 day device clinic remote transmission 04/19/2020.   EP/Cardiology Office Visits:04/26/2020 with Tommye Standard, PA.   Copy of ICM check sent to [place]. .   3 month ICM trend: 03/10/2020    1 Year ICM trend:       [name], RN 03/12/2020 1:00 PM

## 2020-03-12 NOTE — Progress Notes (Signed)
Attempted pre call- no answer-auto VM, no message left

## 2020-03-15 ENCOUNTER — Ambulatory Visit (HOSPITAL_COMMUNITY): Payer: Medicare Other | Admitting: Anesthesiology

## 2020-03-15 ENCOUNTER — Ambulatory Visit (HOSPITAL_COMMUNITY)
Admission: RE | Admit: 2020-03-15 | Discharge: 2020-03-15 | Disposition: A | Discharge status: Home / Self Care | Payer: Medicare Other | Attending: Internal Medicine | Admitting: Internal Medicine

## 2020-03-15 ENCOUNTER — Encounter (HOSPITAL_COMMUNITY)
Admission: RE | Disposition: A | Discharge status: Home / Self Care | Payer: Self-pay | Source: Home / Self Care | Attending: Internal Medicine

## 2020-03-15 ENCOUNTER — Encounter (HOSPITAL_COMMUNITY): Payer: Self-pay | Admitting: Internal Medicine

## 2020-03-15 DIAGNOSIS — I4891 Unspecified atrial fibrillation: Secondary | ICD-10-CM

## 2020-03-15 DIAGNOSIS — I480 Paroxysmal atrial fibrillation: Secondary | ICD-10-CM | POA: Insufficient documentation

## 2020-03-15 DIAGNOSIS — Z79890 Hormone replacement therapy (postmenopausal): Secondary | ICD-10-CM | POA: Diagnosis not present

## 2020-03-15 DIAGNOSIS — N183 Chronic kidney disease, stage 3 unspecified: Secondary | ICD-10-CM | POA: Insufficient documentation

## 2020-03-15 DIAGNOSIS — Z87891 Personal history of nicotine dependence: Secondary | ICD-10-CM | POA: Diagnosis not present

## 2020-03-15 DIAGNOSIS — Z7901 Long term (current) use of anticoagulants: Secondary | ICD-10-CM | POA: Insufficient documentation

## 2020-03-15 DIAGNOSIS — I5022 Chronic systolic (congestive) heart failure: Secondary | ICD-10-CM | POA: Insufficient documentation

## 2020-03-15 DIAGNOSIS — I130 Hypertensive heart and chronic kidney disease with heart failure and stage 1 through stage 4 chronic kidney disease, or unspecified chronic kidney disease: Secondary | ICD-10-CM | POA: Diagnosis not present

## 2020-03-15 DIAGNOSIS — Z79899 Other long term (current) drug therapy: Secondary | ICD-10-CM | POA: Diagnosis not present

## 2020-03-15 DIAGNOSIS — I428 Other cardiomyopathies: Secondary | ICD-10-CM | POA: Diagnosis not present

## 2020-03-15 DIAGNOSIS — Z96641 Presence of right artificial hip joint: Secondary | ICD-10-CM | POA: Insufficient documentation

## 2020-03-15 DIAGNOSIS — D509 Iron deficiency anemia, unspecified: Secondary | ICD-10-CM | POA: Diagnosis not present

## 2020-03-15 DIAGNOSIS — Z95810 Presence of automatic (implantable) cardiac defibrillator: Secondary | ICD-10-CM | POA: Diagnosis not present

## 2020-03-15 DIAGNOSIS — I110 Hypertensive heart disease with heart failure: Secondary | ICD-10-CM | POA: Diagnosis not present

## 2020-03-15 HISTORY — PX: CARDIOVERSION: SHX1299

## 2020-03-15 SURGERY — CARDIOVERSION
Anesthesia: General

## 2020-03-15 MED ORDER — SODIUM CHLORIDE 0.9 % IV SOLN: INTRAVENOUS | Status: DC

## 2020-03-15 MED ORDER — LIDOCAINE 2% (20 MG/ML) 5 ML SYRINGE
INTRAMUSCULAR | Status: DC | PRN
  Administered 2020-03-15: 40 mg via INTRAVENOUS

## 2020-03-15 MED ORDER — PROPOFOL 10 MG/ML IV BOLUS
INTRAVENOUS | Status: DC | PRN
  Administered 2020-03-15: 100 mg via INTRAVENOUS

## 2020-03-15 NOTE — Anesthesia Preprocedure Evaluation (Signed)
Anesthesia Evaluation  Patient identified by MRN, date of birth, ID band  Reviewed: Allergy & Precautions, NPO status , Patient's Chart, lab work & pertinent test results  Airway Mallampati: III  TM Distance: >3 FB Neck ROM: Full    Dental  (+) Dental Advisory Given, Teeth Intact   Pulmonary former smoker,    breath sounds clear to auscultation       Cardiovascular hypertension,  Rhythm:Irregular Rate:Normal     Neuro/Psych    GI/Hepatic   Endo/Other    Renal/GU      Musculoskeletal   Abdominal (+) + obese,   Peds  Hematology   Anesthesia Other Findings   Reproductive/Obstetrics                             Anesthesia Physical Anesthesia Plan  ASA: III  Anesthesia Plan: General   Post-op Pain Management:    Induction: Intravenous  PONV Risk Score and Plan: Ondansetron and Dexamethasone  Airway Management Planned: Oral ETT  Additional Equipment:   Intra-op Plan:   Post-operative Plan: Extubation in OR  Informed Consent: I have reviewed the patients History and Physical, chart, labs and discussed the procedure including the risks, benefits and alternatives for the proposed anesthesia with the patient or authorized representative who has indicated his/her understanding and acceptance.     Dental advisory given  Plan Discussed with: CRNA and Anesthesiologist  Anesthesia Plan Comments:         Anesthesia Quick Evaluation

## 2020-03-15 NOTE — Anesthesia Procedure Notes (Signed)
Date/Time: 03/15/2020 11:33 AM Performed by: Trinna Post., CRNA Pre-anesthesia Checklist: Patient identified, Emergency Drugs available, Suction available, Patient being monitored and Timeout performed Patient Re-evaluated:Patient Re-evaluated prior to induction Oxygen Delivery Method: Ambu bag Preoxygenation: Pre-oxygenation with 100% oxygen Induction Type: IV induction Placement Confirmation: positive ETCO2

## 2020-03-15 NOTE — CV Procedure (Signed)
CARDIOVERSION  Pt sedated by anesthesia with Propofol and lidocaine intravenously With pads in apex/base position, patient cardioverted to SR with 200 J biphasic energy  Procedure without complication  12 lead EKG pending   Dorris Carnes MD

## 2020-03-15 NOTE — Interval H&P Note (Signed)
History and Physical Interval Note:  03/15/2020 11:25 AM  Jason Russell  has presented today for surgery, with the diagnosis of AFIB.  The various methods of treatment have been discussed with the patient and family. After consideration of risks, benefits and other options for treatment, the patient has consented to  Procedure(s): CARDIOVERSION (N/A) as a surgical intervention.  The patient's history has been reviewed, patient examined, no change in status, stable for surgery.  I have reviewed the patient's chart and labs.  Questions were answered to the patient's satisfaction.     Dorris Carnes

## 2020-03-15 NOTE — Discharge Instructions (Signed)
Electrical Cardioversion Electrical cardioversion is the delivery of a jolt of electricity to restore a normal rhythm to the heart. A rhythm that is too fast or is not regular keeps the heart from pumping well. In this procedure, sticky patches or metal paddles are placed on the chest to deliver electricity to the heart from a device. This procedure may be done in an emergency if:  There is low or no blood pressure as a result of the heart rhythm.  Normal rhythm must be restored as fast as possible to protect the brain and heart from further damage.  It may save a life. This may also be a scheduled procedure for irregular or fast heart rhythms that are not immediately life-threatening. Tell a health care provider about:  Any allergies you have.  All medicines you are taking, including vitamins, herbs, eye drops, creams, and over-the-counter medicines.  Any problems you or family members have had with anesthetic medicines.  Any blood disorders you have.  Any surgeries you have had.  Any medical conditions you have.  Whether you are pregnant or may be pregnant. What are the risks? Generally, this is a safe procedure. However, problems may occur, including:  Allergic reactions to medicines.  A blood clot that breaks free and travels to other parts of your body.  The possible return of an abnormal heart rhythm within hours or days after the procedure.  Your heart stopping (cardiac arrest). This is rare. What happens before the procedure? Medicines  Your health care provider may have you start taking: ? Blood-thinning medicines (anticoagulants) so your blood does not clot as easily. ? Medicines to help stabilize your heart rate and rhythm.  Ask your health care provider about: ? Changing or stopping your regular medicines. This is especially important if you are taking diabetes medicines or blood thinners. ? Taking medicines such as aspirin and ibuprofen. These medicines can  thin your blood. Do not take these medicines unless your health care provider tells you to take them. ? Taking over-the-counter medicines, vitamins, herbs, and supplements. General instructions  Follow instructions from your health care provider about eating or drinking restrictions.  Plan to have someone take you home from the hospital or clinic.  If you will be going home right after the procedure, plan to have someone with you for 24 hours.  Ask your health care provider what steps will be taken to help prevent infection. These may include washing your skin with a germ-killing soap. What happens during the procedure?   An IV will be inserted into one of your veins.  Sticky patches (electrodes) or metal paddles may be placed on your chest.  You will be given a medicine to help you relax (sedative).  An electrical shock will be delivered. The procedure may vary among health care providers and hospitals. What can I expect after the procedure?  Your blood pressure, heart rate, breathing rate, and blood oxygen level will be monitored until you leave the hospital or clinic.  Your heart rhythm will be watched to make sure it does not change.  You may have some redness on the skin where the shocks were given. Follow these instructions at home:  Do not drive for 24 hours if you were given a sedative during your procedure.  Take over-the-counter and prescription medicines only as told by your health care provider.  Ask your health care provider how to check your pulse. Check it often.  Rest for 48 hours after the procedure or   as told by your health care provider.  Avoid or limit your caffeine use as told by your health care provider.  Keep all follow-up visits as told by your health care provider. This is important. Contact a health care provider if:  You feel like your heart is beating too quickly or your pulse is not regular.  You have a serious muscle cramp that does not go  away. Get help right away if:  You have discomfort in your chest.  You are dizzy or you feel faint.  You have trouble breathing or you are short of breath.  Your speech is slurred.  You have trouble moving an arm or leg on one side of your body.  Your fingers or toes turn cold or blue. Summary  Electrical cardioversion is the delivery of a jolt of electricity to restore a normal rhythm to the heart.  This procedure may be done right away in an emergency or may be a scheduled procedure if the condition is not an emergency.  Generally, this is a safe procedure.  After the procedure, check your pulse often as told by your health care provider. This information is not intended to replace advice given to you by your health care provider. Make sure you discuss any questions you have with your health care provider. Document Revised: 10/21/2018 Document Reviewed: 10/21/2018 Elsevier Patient Education  2020 Elsevier Inc.  

## 2020-03-15 NOTE — Anesthesia Postprocedure Evaluation (Signed)
Anesthesia Post Note  Patient: Jason Russell  Procedure(s) Performed: CARDIOVERSION (N/A )     Patient location during evaluation: Endoscopy Anesthesia Type: General Level of consciousness: awake and alert Pain management: pain level controlled Vital Signs Assessment: post-procedure vital signs reviewed and stable Respiratory status: spontaneous breathing, nonlabored ventilation, respiratory function stable and patient connected to nasal cannula oxygen Cardiovascular status: blood pressure returned to baseline and stable Postop Assessment: no apparent nausea or vomiting Anesthetic complications: no   No complications documented.  Last Vitals:  Vitals:   03/15/20 1201 03/15/20 1211  BP: 101/62 127/72  Pulse: (!) 111 70  Resp: 18 19  Temp:    SpO2: 100% 98%    Last Pain:  Vitals:   03/15/20 1211  TempSrc:   PainSc: 0-No pain                 Clennon Nasca COKER

## 2020-03-15 NOTE — Transfer of Care (Signed)
Immediate Anesthesia Transfer of Care Note  Patient: Jason Russell  Procedure(s) Performed: CARDIOVERSION (N/A )  Patient Location: PACU and Endoscopy Unit  Anesthesia Type:General  Level of Consciousness: awake and drowsy  Airway & Oxygen Therapy: Patient Spontanous Breathing and Patient connected to nasal cannula oxygen  Post-op Assessment: Report given to RN and Post -op Vital signs reviewed and stable  Post vital signs: Reviewed and stable  Last Vitals:  Vitals Value Taken Time  BP    Temp    Pulse    Resp    SpO2      Last Pain:  Vitals:   03/15/20 1049  TempSrc: Oral  PainSc: 0-No pain         Complications: No complications documented.

## 2020-03-16 ENCOUNTER — Encounter (HOSPITAL_COMMUNITY): Payer: Self-pay | Admitting: Internal Medicine

## 2020-03-22 DIAGNOSIS — E039 Hypothyroidism, unspecified: Secondary | ICD-10-CM | POA: Diagnosis not present

## 2020-03-22 DIAGNOSIS — I4891 Unspecified atrial fibrillation: Secondary | ICD-10-CM | POA: Diagnosis not present

## 2020-03-22 DIAGNOSIS — I130 Hypertensive heart and chronic kidney disease with heart failure and stage 1 through stage 4 chronic kidney disease, or unspecified chronic kidney disease: Secondary | ICD-10-CM | POA: Diagnosis not present

## 2020-03-22 DIAGNOSIS — I10 Essential (primary) hypertension: Secondary | ICD-10-CM | POA: Diagnosis not present

## 2020-03-24 ENCOUNTER — Other Ambulatory Visit: Payer: Self-pay | Admitting: Internal Medicine

## 2020-03-25 ENCOUNTER — Other Ambulatory Visit: Payer: Self-pay | Admitting: Internal Medicine

## 2020-04-02 ENCOUNTER — Other Ambulatory Visit: Payer: Self-pay | Admitting: Internal Medicine

## 2020-04-12 ENCOUNTER — Ambulatory Visit (INDEPENDENT_AMBULATORY_CARE_PROVIDER_SITE_OTHER): Payer: Medicare Other

## 2020-04-12 DIAGNOSIS — I5022 Chronic systolic (congestive) heart failure: Secondary | ICD-10-CM | POA: Diagnosis not present

## 2020-04-12 DIAGNOSIS — Z95810 Presence of automatic (implantable) cardiac defibrillator: Secondary | ICD-10-CM

## 2020-04-13 NOTE — Progress Notes (Signed)
EPIC Encounter for ICM Monitoring  Patient Name: Jason Russell is a 70 y.o. male Date: 04/13/2020 Primary Care Physican: Lucianne Lei, MD Primary Cardiologist:Ross Electrophysiologist: Caryl Comes 12/10/2021Weight: 295lbs  Spoke with patient and reports feeling well at this time.  Denies fluid symptoms.  Cardioversion for 03/15/2020.   Patient reported his wife passed away last week and he got off track with diet.     CorVueThoracic impedance normal but wassuggestingpossible fluid accumulation from 04/03/2020-04/11/2020.  Prescribed:  Furosemide40 mg1tablet (40 mg total)daily.   Potassium 20 mEq 1 tablet daily.  Labs: 03/08/2020 Creatinine 1.66, BUN 27, Potassium 4.4, Sodium 144, GFR 42-48 12/18/2019 Creatinine1.65, BUN24, Potassium4.0, [id number], CHE52-77 12/01/2019 Creatinine1.66, BUN27, Potassium3.7, OEUMPN361, WER15-40  10/13/2019 Creatinine1.90, BUN25, Potassium3.7, GQQPYP950, DTO67-12 05/30/2019 Creatinine 1.89, BUN 24, Potassium 3.8, Sodium 143, GFR 36-42 A complete set of results can be found in results review  Recommendations: He has resumed low salt diet and will call if he experiences any fluid symptoms.  Follow-up plan: ICM clinic phone appointment on2/15/2022. 91 day device clinic remote transmission 04/19/2020.   EP/Cardiology Office Visits:04/26/2020 with Tommye Standard, PA.  Copy of ICM check sent to [facility].   3 month ICM trend: 04/12/2020.    1 Year ICM trend:       [name], RN 04/13/2020 9:43 AM

## 2020-04-15 ENCOUNTER — Ambulatory Visit (HOSPITAL_COMMUNITY)
Admission: RE | Admit: 2020-04-15 | Discharge: 2020-04-15 | Disposition: A | Discharge status: Home / Self Care | Payer: Medicare Other | Source: Ambulatory Visit | Attending: Internal Medicine | Admitting: Internal Medicine

## 2020-04-15 ENCOUNTER — Other Ambulatory Visit: Payer: Self-pay

## 2020-04-15 DIAGNOSIS — C3490 Malignant neoplasm of unspecified part of unspecified bronchus or lung: Secondary | ICD-10-CM | POA: Insufficient documentation

## 2020-04-15 DIAGNOSIS — C3411 Malignant neoplasm of upper lobe, right bronchus or lung: Secondary | ICD-10-CM | POA: Diagnosis not present

## 2020-04-19 ENCOUNTER — Ambulatory Visit: Payer: Medicare Other | Admitting: Internal Medicine

## 2020-04-19 ENCOUNTER — Other Ambulatory Visit: Payer: Medicare Other

## 2020-04-19 ENCOUNTER — Ambulatory Visit (INDEPENDENT_AMBULATORY_CARE_PROVIDER_SITE_OTHER): Payer: Medicare Other

## 2020-04-19 DIAGNOSIS — I428 Other cardiomyopathies: Secondary | ICD-10-CM | POA: Diagnosis not present

## 2020-04-22 ENCOUNTER — Inpatient Hospital Stay: Payer: Medicare Other | Attending: Internal Medicine

## 2020-04-22 ENCOUNTER — Other Ambulatory Visit: Payer: Self-pay

## 2020-04-22 ENCOUNTER — Inpatient Hospital Stay (HOSPITAL_BASED_OUTPATIENT_CLINIC_OR_DEPARTMENT_OTHER): Payer: Medicare Other | Admitting: Internal Medicine

## 2020-04-22 VITALS — BP 118/70 | HR 59 | Temp 97.1°F | Resp 18 | Ht 73.0 in | Wt 299.0 lb

## 2020-04-22 DIAGNOSIS — J449 Chronic obstructive pulmonary disease, unspecified: Secondary | ICD-10-CM | POA: Diagnosis not present

## 2020-04-22 DIAGNOSIS — E785 Hyperlipidemia, unspecified: Secondary | ICD-10-CM | POA: Diagnosis not present

## 2020-04-22 DIAGNOSIS — I2510 Atherosclerotic heart disease of native coronary artery without angina pectoris: Secondary | ICD-10-CM | POA: Diagnosis not present

## 2020-04-22 DIAGNOSIS — Z9221 Personal history of antineoplastic chemotherapy: Secondary | ICD-10-CM | POA: Insufficient documentation

## 2020-04-22 DIAGNOSIS — G4733 Obstructive sleep apnea (adult) (pediatric): Secondary | ICD-10-CM | POA: Diagnosis not present

## 2020-04-22 DIAGNOSIS — Z7901 Long term (current) use of anticoagulants: Secondary | ICD-10-CM | POA: Insufficient documentation

## 2020-04-22 DIAGNOSIS — E039 Hypothyroidism, unspecified: Secondary | ICD-10-CM | POA: Diagnosis not present

## 2020-04-22 DIAGNOSIS — Z923 Personal history of irradiation: Secondary | ICD-10-CM | POA: Diagnosis not present

## 2020-04-22 DIAGNOSIS — I110 Hypertensive heart disease with heart failure: Secondary | ICD-10-CM | POA: Insufficient documentation

## 2020-04-22 DIAGNOSIS — C3490 Malignant neoplasm of unspecified part of unspecified bronchus or lung: Secondary | ICD-10-CM | POA: Diagnosis not present

## 2020-04-22 DIAGNOSIS — C3411 Malignant neoplasm of upper lobe, right bronchus or lung: Secondary | ICD-10-CM

## 2020-04-22 DIAGNOSIS — I4891 Unspecified atrial fibrillation: Secondary | ICD-10-CM | POA: Insufficient documentation

## 2020-04-22 DIAGNOSIS — Z79899 Other long term (current) drug therapy: Secondary | ICD-10-CM | POA: Insufficient documentation

## 2020-04-22 DIAGNOSIS — Z9989 Dependence on other enabling machines and devices: Secondary | ICD-10-CM | POA: Insufficient documentation

## 2020-04-22 DIAGNOSIS — I5022 Chronic systolic (congestive) heart failure: Secondary | ICD-10-CM | POA: Diagnosis not present

## 2020-04-22 LAB — CBC WITH DIFFERENTIAL (CANCER CENTER ONLY)
Abs Immature Granulocytes: 0.04 10*3/uL (ref 0.00–0.07)
Basophils Absolute: 0 10*3/uL (ref 0.0–0.1)
Basophils Relative: 1 %
Eosinophils Absolute: 0.1 10*3/uL (ref 0.0–0.5)
Eosinophils Relative: 2 %
HCT: 37.8 % — ABNORMAL LOW (ref 39.0–52.0)
Hemoglobin: 11.6 g/dL — ABNORMAL LOW (ref 13.0–17.0)
Immature Granulocytes: 1 %
Lymphocytes Relative: 12 %
Lymphs Abs: 0.6 10*3/uL — ABNORMAL LOW (ref 0.7–4.0)
MCH: 28.9 pg (ref 26.0–34.0)
MCHC: 30.7 g/dL (ref 30.0–36.0)
MCV: 94 fL (ref 80.0–100.0)
Monocytes Absolute: 0.9 10*3/uL (ref 0.1–1.0)
Monocytes Relative: 16 %
Neutro Abs: 3.7 10*3/uL (ref 1.7–7.7)
Neutrophils Relative %: 68 %
Platelet Count: 157 10*3/uL (ref 150–400)
RBC: 4.02 MIL/uL — ABNORMAL LOW (ref 4.22–5.81)
RDW: 14.5 % (ref 11.5–15.5)
WBC Count: 5.3 10*3/uL (ref 4.0–10.5)
nRBC: 0 % (ref 0.0–0.2)

## 2020-04-22 LAB — CUP PACEART REMOTE DEVICE CHECK
Battery Remaining Longevity: 83 mo
Battery Remaining Percentage: 78 %
Battery Voltage: 3.01 V
Brady Statistic RV Percent Paced: 1 %
Date Time Interrogation Session: 20220117020017
HighPow Impedance: 40 Ohm
HighPow Impedance: 41 Ohm
Implantable Lead Implant Date: 20140703
Implantable Lead Location: 753860
Implantable Pulse Generator Implant Date: 20191113
Lead Channel Impedance Value: 380 Ohm
Lead Channel Pacing Threshold Amplitude: 0.5 V
Lead Channel Pacing Threshold Pulse Width: 0.5 ms
Lead Channel Sensing Intrinsic Amplitude: 3.4 mV
Lead Channel Setting Pacing Amplitude: 2.5 V
Lead Channel Setting Pacing Pulse Width: 0.5 ms
Lead Channel Setting Sensing Sensitivity: 0.5 mV
Pulse Gen Serial Number: 9820959

## 2020-04-22 LAB — CMP (CANCER CENTER ONLY)
ALT: 20 U/L (ref 0–44)
AST: 18 U/L (ref 15–41)
Albumin: 3.6 g/dL (ref 3.5–5.0)
Alkaline Phosphatase: 105 U/L (ref 38–126)
Anion gap: 7 (ref 5–15)
BUN: 44 mg/dL — ABNORMAL HIGH (ref 8–23)
CO2: 29 mmol/L (ref 22–32)
Calcium: 9.6 mg/dL (ref 8.9–10.3)
Chloride: 106 mmol/L (ref 98–111)
Creatinine: 2.12 mg/dL — ABNORMAL HIGH (ref 0.61–1.24)
GFR, Estimated: 33 mL/min — ABNORMAL LOW (ref >60)
Glucose, Bld: 87 mg/dL (ref 70–99)
Potassium: 4.4 mmol/L (ref 3.5–5.1)
Sodium: 142 mmol/L (ref 135–145)
Total Bilirubin: 0.4 mg/dL (ref 0.3–1.2)
Total Protein: 6.9 g/dL (ref 6.5–8.1)

## 2020-04-22 NOTE — Progress Notes (Signed)
[facility] [phone]   [phone]  OFFICE PROGRESS NOTE  Lucianne Lei, MD [address] [place] [address]  DIAGNOSIS: Stage IIIA (T1a, N2, M0) non-small cell lung cancer, adenocarcinoma presented with right upper lobe lung nodule in addition to mediastinal lymphadenopathy diagnosed in March 2018.  Biomarker Findings Tumor Mutational Burden - TMB-Intermediate (8 Muts/Mb) Microsatellite Status - MS-Stable Genomic Findings For a complete list of the genes assayed, please refer to the Appendix. ERBB2 amplification - equivocal? DNMT3A K452f*192 FUBP1 Q40* KEAP1 [id number] TP53 C275F 7 Disease relevant genes with no reportable alterations: EGFR, KRAS, ALK, BRAF, MET, RET, ROS1   PRIOR THERAPY:  1) Course of concurrent chemoradiation with weekly carboplatin for AUC of 2 and paclitaxel 45 MG/M2. Status post 6 cycles. Last cycle was given 10/16/2016. 2)  Consolidation treatment with immunotherapy with Imfinzi (Durvalumab) 10 MG/M2 every 2 weeks. First dose 12/21/2016.  Status post 26 cycles.  CURRENT THERAPY: Observation.  INTERVAL HISTORY: Jason Carn70.o. male returns to the clinic today for 6-69-monthllow-up visit.  The patient is feeling fine today with no concerning complaints.  He denied having any current chest pain, shortness of breath, cough or hemoptysis.  He denied having any fever or chills.  He has no nausea, vomiting, diarrhea or constipation.  He denied having any headache or visual changes.  The patient had repeat CT scan of the chest performed recently and is here for evaluation and discussion of his scan results.   MEDICAL HISTORY: Past Medical History:  Diagnosis Date  . Adenocarcinoma of right lung, stage 3 ([facility]/23/2018  . Anemia   . Arthritis    "hx right hip"  . Asthma    "when I was a child"  . Atrial fibrillation (HCC)    Amiodarone started 10/2011; Coumadin  . Automatic implantable  cardioverter-defibrillator in situ 10/03/2012   a. St. Jude ICD implantation 10/03/12.  . Chronic anticoagulation   . Chronic fatigue 10/18/2016  . Chronic systolic heart failure ([place]  a. Echo 7/13: EF 25%;  b. echo 04/2012:  Mild LVH, EF 30-35%, Gr 1 DD, mild AI, mild MR, mild LAE  . COPD (chronic obstructive pulmonary disease) ([facility] . Dyslipidemia   . Gout   . History of blood transfusion 10/15/2013   "don't know where the blood's going; HgB down to 5"  . Hyperlipidemia   . Hypertension   . Hypothyroidism   . NICM (nonischemic cardiomyopathy) ([facility]  [place]:  minimal CAD  . Obesity   . OSA on CPAP   . Tobacco abuse     ALLERGIES:  has No Known Allergies.  MEDICATIONS:  Current Outpatient Medications  Medication Sig Dispense Refill  . acetaminophen (TYLENOL) 500 MG tablet Take 1,000 mg by mouth every 6 (six) hours as needed for mild pain.    . aMarland Kitchenbuterol (VENTOLIN HFA) 108 (90 Base) MCG/ACT inhaler INHALE 2 PUFFS INTO THE LUNGS EVERY 4 (FOUR) HOURS AS NEEDED FOR WHEEZING OR SHORTNESS OF BREATH 18 g 2  . amiodarone (PACERONE) 200 MG tablet Take 1 tablet (200 mg total) by mouth daily. 90 tablet 1  . atorvastatin (LIPITOR) 40 MG tablet Take 1 tablet (40 mg total) by mouth daily. 90 tablet 3  . carvedilol (COREG) 12.5 MG tablet TAKE 1 TABLET BY MOUTH TWICE A DAY (Patient taking differently: Take 12.5 mg by mouth 2 (two) times daily with a meal. ) 180 tablet  3  . clotrimazole-betamethasone (LOTRISONE) cream Apply 1 application topically once as needed (for rASH).    [name] [employer] D3 2000 units CAPS Take 2,000 Units by mouth daily.   11  . feeding supplement, ENSURE ENLIVE, (ENSURE ENLIVE) LIQD Take 237 mLs by mouth 2 (two) times daily between meals. 237 mL 12  . ferrous gluconate (FERGON) 324 MG tablet Take 1 tablet (324 mg total) by mouth 3 (three) times daily with meals. 270 tablet 3  . Fluticasone-Umeclidin-Vilant 100-62.5-25 MCG/INH AEPB TAKE 1 PUFF BY MOUTH EVERY DAY (Patient taking  differently: Inhale 1 puff into the lungs daily. TAKE 1 PUFF BY MOUTH EVERY DAY) 180 each 11  . furosemide (LASIX) 40 MG tablet Take 1 tablet (40 mg total) by mouth daily. 90 tablet 3  . hydrALAZINE (APRESOLINE) 100 MG tablet TAKE 1 TABLET BY MOUTH THREE TIMES A DAY (Patient taking differently: Take 100 mg by mouth 3 (three) times daily. TAKE 1 TABLET BY MOUTH THREE TIMES A DAY) 270 tablet 2  . levothyroxine (SYNTHROID) 50 MCG tablet TAKE 1 TABLET BY MOUTH DAILY BEFORE BREAKFAST 90 tablet 3  . magnesium oxide (MAG-OX) 400 (241.3 Mg) MG tablet Take 1 tablet (400 mg total) by mouth daily. 30 tablet 0  . Polyvinyl Alcohol-Povidone (REFRESH OP) Place 1 drop into both eyes every morning.    . Potassium Chloride ER 20 MEQ TBCR TAKE 1 TABLET BY MOUTH EVERY DAY (Patient taking differently: Take 20 mEq by mouth daily. ) 90 tablet 2  . sacubitril-valsartan (ENTRESTO) 97-103 MG Take 1 tablet by mouth 2 (two) times daily. 180 tablet 3  . sildenafil (VIAGRA) 100 MG tablet Take 1 tablet (100 mg total) by mouth daily as needed for erectile dysfunction. 10 tablet 1  . spironolactone (ALDACTONE) 25 MG tablet Take 1 tablet (25 mg total) by mouth daily. Please make overdue appt with Dr. Graciela Husbands before anymore refills. 1st attempt 30 tablet 0  . ULORIC 40 MG tablet Take 1 tablet (40 mg total) by mouth daily. 30 tablet 0  . XARELTO 20 MG TABS tablet TAKE 1 TABLET BY MOUTH EVERY DAY (Patient taking differently: Take 20 mg by mouth daily with supper. ) 90 tablet 1   No current facility-administered medications for this visit.    SURGICAL HISTORY:  Past Surgical History:  Procedure Laterality Date  . CARDIAC DEFIBRILLATOR PLACEMENT  2014  . CARDIOVERSION  2011  . CARDIOVERSION N/A 03/15/2020   Procedure: CARDIOVERSION;  Surgeon: Pricilla Riffle, MD;  Location: [facility] ENDOSCOPY;  Service: Cardiovascular;  Laterality: N/A;  . COLONOSCOPY WITH PROPOFOL Left 10/17/2013   Procedure: COLONOSCOPY WITH PROPOFOL;  Surgeon: Louis Meckel, MD;  Location: [facility] ENDOSCOPY;  Service: Endoscopy;  Laterality: Left;  . ESOPHAGOGASTRODUODENOSCOPY N/A 10/17/2013   Procedure: ESOPHAGOGASTRODUODENOSCOPY (EGD);  Surgeon: Louis Meckel, MD;  Location: [facility] ENDOSCOPY;  Service: Endoscopy;  Laterality: N/A;  . ESOPHAGOGASTRODUODENOSCOPY (EGD) WITH PROPOFOL N/A 06/14/2018   Procedure: ESOPHAGOGASTRODUODENOSCOPY (EGD) WITH PROPOFOL;  Surgeon: Sherrilyn Rist, MD;  Location: [facility] ENDOSCOPY;  Service: Gastroenterology;  Laterality: N/A;  . GIVENS CAPSULE STUDY N/A 10/29/2013   Procedure: GIVENS CAPSULE STUDY;  Surgeon: Louis Meckel, MD;  Location: [facility] ENDOSCOPY;  Service: Endoscopy;  Laterality: N/A;  . ICD GENERATOR CHANGEOUT N/A 02/13/2018   Procedure: ICD GENERATOR CHANGEOUT;  Surgeon: Regan Lemming, MD;  Location: [facility] INVASIVE CV LAB;  Service: Cardiovascular;  Laterality: N/A;  . IMPLANTABLE CARDIOVERTER DEFIBRILLATOR IMPLANT Left 10/03/2012   Procedure: IMPLANTABLE CARDIOVERTER DEFIBRILLATOR IMPLANT;  Surgeon: Deboraha Sprang, MD;  Location: [facility] CATH LAB;  Service: Cardiovascular;  Laterality: Left;  . IR FLUORO GUIDE PORT INSERTION RIGHT  09/21/2016  . IR US GUIDE VASC ACCESS RIGHT  09/21/2016  . JOINT REPLACEMENT    . SAVORY DILATION N/A 06/14/2018   Procedure: SAVORY DILATION;  Surgeon: Doran Stabler, MD;  Location: [facility] ENDOSCOPY;  Service: Gastroenterology;  Laterality: N/A;  . TONSILLECTOMY  1950's  . TOTAL HIP ARTHROPLASTY Right 11/25/1997  . VIDEO BRONCHOSCOPY WITH ENDOBRONCHIAL ULTRASOUND N/A 08/09/2016   Procedure: VIDEO BRONCHOSCOPY WITH ENDOBRONCHIAL ULTRASOUND;  Surgeon: Javier Glazier, MD;  Location: [facility] OR;  Service: Thoracic;  Laterality: N/A;    REVIEW OF SYSTEMS:  A comprehensive review of systems was negative.   PHYSICAL EXAMINATION: General appearance: alert, cooperative and no distress Head: Normocephalic, without obvious abnormality, atraumatic Neck: no adenopathy, no JVD, supple, symmetrical, trachea midline  and thyroid not enlarged, symmetric, no tenderness/mass/nodules Lymph nodes: Cervical, supraclavicular, and axillary nodes normal. Resp: clear to auscultation bilaterally Back: symmetric, no curvature. ROM normal. No CVA tenderness. Cardio: regular rate and rhythm, S1, S2 normal, no murmur, click, rub or gallop GI: soft, non-tender; bowel sounds normal; no masses,  no organomegaly Extremities: extremities normal, atraumatic, no cyanosis or edema  ECOG PERFORMANCE STATUS: 1 - Symptomatic but completely ambulatory  Blood pressure 118/70, pulse (!) 59, temperature (!) 97.1 F (36.2 C), temperature source Tympanic, resp. rate 18, height _0  (1.854 m), weight 299 lb (135.6 kg), SpO2 100 %.  LABORATORY DATA: Lab Results  Component Value Date   WBC 5.3 04/22/2020   HGB 11.6 (L) 04/22/2020   HCT 37.8 (L) 04/22/2020   MCV 94.0 04/22/2020   PLT 157 04/22/2020      Chemistry      Component Value Date/Time   NA 144 03/08/2020 1449   NA 141 03/29/2017 1038   K 4.4 03/08/2020 1449   K 4.0 03/29/2017 1038   CL 107 (H) 03/08/2020 1449   CO2 26 03/08/2020 1449   CO2 27 03/29/2017 1038   BUN 27 03/08/2020 1449   BUN 28.6 (H) 03/29/2017 1038   CREATININE 1.66 (H) 03/08/2020 1449   CREATININE 1.90 (H) 10/13/2019 1512   CREATININE 1.6 (H) 03/29/2017 1038      Component Value Date/Time   CALCIUM 9.5 03/08/2020 1449   CALCIUM 9.3 03/29/2017 1038   ALKPHOS 107 10/13/2019 1512   ALKPHOS 105 03/29/2017 1038   AST 15 10/13/2019 1512   AST 18 03/29/2017 1038   ALT 14 10/13/2019 1512   ALT 13 03/29/2017 1038   BILITOT 0.5 10/13/2019 1512   BILITOT 0.62 03/29/2017 1038       RADIOGRAPHIC STUDIES: CT Chest Wo Contrast  Result Date: 04/15/2020 CLINICAL DATA:  Non-small cell right upper lobe non-small cell lung cancer diagnosed in 2018 status post chemo radiation therapy and immunotherapy. Restaging. EXAM: CT CHEST WITHOUT CONTRAST TECHNIQUE: Multidetector CT imaging of the chest was  performed following the standard protocol without IV contrast. COMPARISON:  10/13/2019 chest CT. FINDINGS: Cardiovascular: Top-normal heart size. Single lead left subclavian ICD with lead tip in the right ventricular apex. No significant pericardial effusion/thickening. Left anterior descending and right coronary atherosclerosis. Atherosclerotic thoracic aorta with stable ectatic 4.1 cm ascending thoracic aorta and dilated aortic root measuring 4.8 cm, stable. Top-normal caliber main pulmonary artery (3.0 cm diameter). Mediastinum/Nodes: No discrete thyroid nodules. Unremarkable esophagus. No pathologically enlarged axillary, mediastinal or hilar lymph nodes, noting limited sensitivity for the detection of  hilar adenopathy on this noncontrast study. Lungs/Pleura: No pneumothorax. No pleural effusion. Sharply marginated patchy consolidation in the medial upper right lung with associated mild bronchiectasis, volume loss and distortion, unchanged. Tiny 3 mm anterior apical right upper lobe pulmonary nodule (series 7/image 21), stable. No acute consolidative airspace disease, lung masses or new significant pulmonary nodules. Mild centrilobular and paraseptal emphysema. Upper abdomen: Nonobstructing 7 mm lower right renal stone. Simple 1.9 cm interpolar left renal cyst. Musculoskeletal: No aggressive appearing focal osseous lesions. Symmetric mild bilateral gynecomastia, stable. Marked thoracic spondylosis. IMPRESSION: 1. Stable post treatment changes in the medial upper right lung with no evidence of local tumor recurrence. 2. No evidence of metastatic disease in the chest. 3. Stable ectatic 4.1 cm ascending thoracic aorta and dilated aortic root measuring 4.8 cm. Recommend annual imaging followup by CTA or MRA. This recommendation follows 2010 ACCF/AHA/AATS/ACR/ASA/SCA/SCAI/SIR/STS/SVM Guidelines for the Diagnosis and Management of Patients with Thoracic Aortic Disease. Circulation. 2010; 121: S975-P005. Aortic  aneurysm NOS (ICD10-I71.9). 4. Two-vessel coronary atherosclerosis. 5. Nonobstructing right nephrolithiasis. 6. Aortic Atherosclerosis (ICD10-I70.0) and Emphysema (ICD10-J43.9). Electronically Signed   By: Ilona Sorrel M.D.   On: 04/15/2020 16:34    ASSESSMENT AND PLAN:  This is a very pleasant 70 years old African-American male with stage IIIA non-small cell lung cancer, adenocarcinoma.  The patient completed 6 weeks of concurrent chemoradiation with weekly carboplatin and paclitaxel and tolerated his treatment well except for odynophagia. The patient was then started on consolidation treatment with immunotherapy with Imfinzi (Durvalumab) status post 26 cycles.  The patient has been on observation since the completion of his consolidation immunotherapy. He had repeat CT scan of the chest performed recently.  I personally and independently reviewed the scans and discussed the results with the patient today. His scan showed no concerning findings for disease progression. I recommended for him to continue on observation with repeat CT scan of the chest in 6 months. For the renal insufficiency, I recommended for the patient to discuss with his primary care physician further evaluation and referral to nephrology if needed. He was advised to call immediately if he has any other concerning symptoms in the interval. The patient voices understanding of current disease status and treatment options and is in agreement with the current care plan. All questions were answered. The patient knows to call the clinic with any problems, questions or concerns. We can certainly see the patient much sooner if necessary. Disclaimer: This note was dictated with voice recognition software. Similar sounding words can inadvertently be transcribed and may not be corrected upon review.

## 2020-04-22 NOTE — Progress Notes (Signed)
Cardiology Office Note Date:  04/22/2020  Patient ID:  Jason Russell, Jason Russell [birth year], MRN [id number] PCP:  Lucianne Lei, MD  Cardiologist:  Dr. Harrington Challenger Electrophysiologist: Dr. Caryl Comes    Chief Complaint:   planned f/u, post DCCV  History of Present Illness: Jason Russell is a 70 y.o. 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   He was hospitalized 10/8-10/9/18 with rapid unintentional weight loss and ICD therapy.  He reported that his wife had fallen and he was helping her up when he was shocked by his device.  No reports of symptoms associated with it.  The patient was evaluated and found to be hypokalemic and low mag as well and felt this contributed to his arhythmia, by interrogation was VT.  He also reported that he had completed his chemo and radiation was doing OK when they started immunotherapy after his first dose, became weak, anorexic and reported 20lb weight loss in 2 weeks.  The patient's electrolytes were replaced his coreg was increased and discharged home.  He comes in today to be seen for Dr. Caryl Comes, last seen by him Aug 2020.  He mentions RV pacing threshold chronically low (?), , RBBB.  Discussed no AFib and his amiodarone was reduced with plans for further reduction (to 700g/week) at his next annual visit if none again.  More recently saw Dr. Harrington Challenger Aug 2021, was doing OK, entresto was titrated up.  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 He denies any difficulties with his ADLs, no SOB, DOE. No CP, palpitations or cardiac awareness. No dizzy spells, near syncope or syncope. He reports compliacne with his medicines, discussed his xarelto specifically, says he never misses. No bleeding or signs of bleeding. He was in rate controlled AFib, not overly symptomatic, perhaps with vague sense of  reduced energy, his amiodarone increased to 200mg  daily (from 100mg  daily) and planned for DCCV.  DCCV 03/15/20 was succesful.  TODAY He is doing well.  Does not think he can tell much if any improvement post DCCV. He denies any CP, palpitations or cardiac awareness No dizzy spells, near syncope or syncope. NO bleeding or signs of bleeding  He saw his oncologist who did labs and has been referred to a nephrologist, Dr. Thereasa Distance but has not gotten an appointment yet.  He does not weigh daily, does not think he has gained, does not feel bloated or swollen    Device information: SJM single chamber ICD, implanted 10/03/12 > gen change Nov 2019 for early gen depletion + hx of appropriate tx Oct 2018 for VT AAD Tx:  Amiodarone (goes as far back as his Epic chart (2013) seems for his AF   Past Medical History:  Diagnosis Date  . Adenocarcinoma of right lung, stage 3 ([place]) 08/23/2016  . Anemia   . Arthritis    "hx right hip"  . Asthma    "when I was a child"  . Atrial fibrillation (HCC)    Amiodarone started 10/2011; Coumadin  . 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 St. Jude ICD implantation 10/03/12.  . Chronic anticoagulation   . Chronic fatigue 10/18/2016  . Chronic systolic heart failure ([place])    a. Echo 7/13: EF 25%;  b. echo 04/2012:  Mild LVH, EF 30-35%, Gr 1 DD, mild AI,  mild MR, mild LAE  . COPD (chronic obstructive pulmonary disease) ([place])   . Dyslipidemia   . Gout   . History of blood transfusion 10/15/2013   "don't know where the blood's going; HgB down to 5"  . Hyperlipidemia   . Hypertension   . Hypothyroidism   . NICM (nonischemic cardiomyopathy) ([place])    [place] 4/14:  minimal CAD  . Obesity   . OSA on CPAP   . Tobacco abuse     Past Surgical History:  Procedure Laterality Date  . CARDIAC DEFIBRILLATOR PLACEMENT  2014  . CARDIOVERSION  2011  . CARDIOVERSION N/A 03/15/2020   Procedure: CARDIOVERSION;  Surgeon: Fay Records, MD;  Location: [facility];  Service: Cardiovascular;  Laterality: N/A;  . COLONOSCOPY WITH PROPOFOL Left 10/17/2013   Procedure: COLONOSCOPY WITH PROPOFOL;  Surgeon: Inda Castle, MD;  Location: [facility];  Service: Endoscopy;  Laterality: Left;  . ESOPHAGOGASTRODUODENOSCOPY N/A 10/17/2013   Procedure: ESOPHAGOGASTRODUODENOSCOPY (EGD);  Surgeon: Inda Castle, MD;  Location: [facility];  Service: Endoscopy;  Laterality: N/A;  . ESOPHAGOGASTRODUODENOSCOPY (EGD) WITH PROPOFOL N/A 06/14/2018   Procedure: ESOPHAGOGASTRODUODENOSCOPY (EGD) WITH PROPOFOL;  Surgeon: Doran Stabler, MD;  Location: [facility];  Service: Gastroenterology;  Laterality: N/A;  . GIVENS CAPSULE STUDY N/A 10/29/2013   Procedure: GIVENS CAPSULE STUDY;  Surgeon: Inda Castle, MD;  Location: [facility];  Service: Endoscopy;  Laterality: N/A;  . ICD GENERATOR CHANGEOUT N/A 02/13/2018   Procedure: ICD GENERATOR CHANGEOUT;  Surgeon: Constance Haw, MD;  Location: [facility];  Service: Cardiovascular;  Laterality: N/A;  . IMPLANTABLE CARDIOVERTER DEFIBRILLATOR IMPLANT Left 10/03/2012   Procedure: IMPLANTABLE CARDIOVERTER DEFIBRILLATOR IMPLANT;  Surgeon: Deboraha Sprang, MD;  Location: [facility] C[facility] LAB;  Service: Cardiovascular;  Laterality: Left;  . IR FLUORO GUIDE PORT INSERTION RIGHT  09/21/2016  . IR US GUIDE VASC ACCESS RIGHT  09/21/2016  . JOINT REPLACEMENT    . SAVORY DILATION N/A 06/14/2018   Procedure: SAVORY DILATION;  Surgeon: Doran Stabler, MD;  Location: [facility];  Service: Gastroenterology;  Laterality: N/A;  . TONSILLECTOMY  1950's  . TOTAL HIP ARTHROPLASTY Right 11/25/1997  . VIDEO BRONCHOSCOPY WITH ENDOBRONCHIAL ULTRASOUND N/A 08/09/2016   Procedure: VIDEO BRONCHOSCOPY WITH ENDOBRONCHIAL ULTRASOUND;  Surgeon: Javier Glazier, MD;  Location: [facility] OR;  Service: Thoracic;  Laterality: N/A;    Current Outpatient Medications  Medication Sig Dispense Refill  . acetaminophen (TYLENOL) 500  MG tablet Take 1,000 mg by mouth every 6 (six) hours as needed for mild pain.    [name] albuterol (VENTOLIN HFA) 108 (90 Base) MCG/ACT inhaler INHALE 2 PUFFS INTO THE LUNGS EVERY 4 (FOUR) HOURS AS NEEDED FOR WHEEZING OR SHORTNESS OF BREATH 18 g 2  . amiodarone (PACERONE) 200 MG tablet Take 1 tablet (200 mg total) by mouth daily. 90 tablet 1  . atorvastatin (LIPITOR) 40 MG tablet Take 1 tablet (40 mg total) by mouth daily. 90 tablet 3  . carvedilol (COREG) 12.5 MG tablet TAKE 1 TABLET BY MOUTH TWICE A DAY (Patient taking differently: Take 12.5 mg by mouth 2 (two) times daily with a meal. ) 180 tablet 3  . clotrimazole-betamethasone (LOTRISONE) cream Apply 1 application topically once as needed (for rASH).    [name] [employer] D3 2000 units CAPS Take 2,000 Units by mouth daily.   11  . feeding supplement, ENSURE ENLIVE, (ENSURE ENLIVE) LIQD Take 237 mLs by mouth 2 (two) times daily between meals. 237 mL 12  .  ferrous gluconate (FERGON) 324 MG tablet Take 1 tablet (324 mg total) by mouth 3 (three) times daily with meals. 270 tablet 3  . Fluticasone-Umeclidin-Vilant 100-62.5-25 MCG/INH AEPB TAKE 1 PUFF BY MOUTH EVERY DAY (Patient taking differently: Inhale 1 puff into the lungs daily. TAKE 1 PUFF BY MOUTH EVERY DAY) 180 each 11  . furosemide (LASIX) 40 MG tablet Take 1 tablet (40 mg total) by mouth daily. 90 tablet 3  . hydrALAZINE (APRESOLINE) 100 MG tablet TAKE 1 TABLET BY MOUTH THREE TIMES A DAY (Patient taking differently: Take 100 mg by mouth 3 (three) times daily. TAKE 1 TABLET BY MOUTH THREE TIMES A DAY) 270 tablet 2  . levothyroxine (SYNTHROID) 50 MCG tablet TAKE 1 TABLET BY MOUTH DAILY BEFORE BREAKFAST 90 tablet 3  . magnesium oxide (MAG-OX) 400 (241.3 Mg) MG tablet Take 1 tablet (400 mg total) by mouth daily. 30 tablet 0  . Polyvinyl Alcohol-Povidone (REFRESH OP) Place 1 drop into both eyes every morning.    . Potassium Chloride ER 20 MEQ TBCR TAKE 1 TABLET BY MOUTH EVERY DAY (Patient taking differently: Take 20  mEq by mouth daily. ) 90 tablet 2  . sacubitril-valsartan (ENTRESTO) 97-103 MG Take 1 tablet by mouth 2 (two) times daily. 180 tablet 3  . sildenafil (VIAGRA) 100 MG tablet Take 1 tablet (100 mg total) by mouth daily as needed for erectile dysfunction. 10 tablet 1  . spironolactone (ALDACTONE) 25 MG tablet Take 1 tablet (25 mg total) by mouth daily. Please make overdue appt with Dr. Caryl Comes before anymore refills. 1st attempt 30 tablet 0  . ULORIC 40 MG tablet Take 1 tablet (40 mg total) by mouth daily. 30 tablet 0  . XARELTO 20 MG TABS tablet TAKE 1 TABLET BY MOUTH EVERY DAY (Patient taking differently: Take 20 mg by mouth daily with supper. ) 90 tablet 1   No current facility-administered medications for this visit.    Allergies:   Patient has no known allergies.   Social History:  The patient  reports that he quit smoking about 3 years ago. His smoking use included cigarettes. He has a 11.25 pack-year smoking history. He has never used smokeless tobacco. He reports current alcohol use of about 1.0 standard drink of alcohol per week. He reports that he does not use drugs.   Family History:  The patient's family history includes Heart Problems in his mother; Hypertension in his mother; Lung cancer in his brother; Other in his father.  ROS:  Please see the history of present illness.  All other systems are reviewed and otherwise negative.   PHYSICAL EXAM:  VS:  There were no vitals taken for this visit. BMI: There is no height or weight on file to calculate BMI. Well nourished, well developed, in no acute distress  HEENT: normocephalic, atraumatic  Neck: no JVD, carotid bruits or masses Cardiac: RRR; no significant murmurs, no rubs, or gallops Lungs:   CTA b/l, no wheezing, rhonchi or rales  Abd: soft, non-tender, obese MS: no deformity or atrophy Ext:  trace if anyedema  Skin: warm and dry, no rash Neuro:  No gross deficits appreciated Psych: euthymic mood, full affect  ICD site is  stable, no tethering or discomfort   EKG:   Done today and reviewed by myself SR, 84bpm, RBBB, PAC  ICD interrogation done today and reviewed by myself:  Battery and lead measurements are stable No arrhythmias <1% VP  05/24/2018: TTE IMPRESSIONS  1. The left ventricle has moderately reduced  systolic function, with an  ejection fraction of 35-40%. The cavity size was normal. There is mildly  increased left ventricular wall thickness. Left ventricular diastolic  Doppler parameters are consistent with  impaired relaxation.  2. The right ventricle has mildly reduced systolic function. The cavity  was small. There is no increase in right ventricular wall thickness.  3. The mitral valve is normal in structure. Mild thickening of the mitral  valve leaflet.  4. The tricuspid valve is normal in structure.  5. The aortic valve is normal in structure. Aortic valve regurgitation is  mild to moderate by color flow Doppler.  6. The interatrial septum was not assessed.   12/28/14: TTE Study Conclusions - Left ventricle: The cavity size was moderately dilated. There was   mild concentric hypertrophy. Systolic function was moderately to   severely reduced. The estimated ejection fraction was in the   range of 30% to 35%. Moderate diffuse hypokinesis with no   identifiable regional variations. Features are consistent with a   pseudonormal left ventricular filling pattern, with concomitant   abnormal relaxation and increased filling pressure (grade 2   diastolic dysfunction). - Aortic valve: There was mild to moderate regurgitation. - Left atrium: The atrium was moderately dilated. - Right ventricle: The cavity size was mildly dilated. Wall   thickness was normal. - Atrial septum: There was an atrial septal aneurysm. - Pulmonary arteries: Systolic pressure was mildly increased. PA   peak pressure: 37 mm Hg (S).   Recent Labs: 10/13/2019: ALT 14 12/01/2019: Magnesium 2.2; TSH  2.100 03/08/2020: BUN 27; Creatinine, Ser 1.66; Hemoglobin 13.9; Platelets 179; Potassium 4.4; Sodium 144  01/28/2020: Chol/HDL Ratio 3.8; Cholesterol, Total 117; HDL 31; LDL Chol Calc (NIH) 71; Triglycerides 71   CrCl cannot be calculated (Patient's most recent lab result is older than the maximum 21 days allowed.).   Wt Readings from Last 3 Encounters:  03/15/20 295 lb (133.8 kg)  03/08/20 (!) 303 lb (137.4 kg)  12/01/19 (!) 306 lb (138.8 kg)     Other studies reviewed: Additional studies/records reviewed today include: summarized above  ASSESSMENT AND PLAN:  1. ICD     Intact function, no programming changes made  2. VT     None noted by today's device interrogation  3. NICM     Chronic CHF (systolic)     LVEF 33-82% by last echo     On good meds     CorVue would suggest perhaps early volume OL     No symptoms of volume OL     Only tace if edema,     Weight is stable       Creat on 04/22/20 was up to 2.12 pending a nephrology consult I will send note to Dr. Harrington Challenger, perhaps consider reducing his Entresto  4. HTN     Looks OK, no changes  5. Paroxysmal Afib     CHA2DS2Vasc is at least 3, on xarelto, remains appropriately dosed with the last Creat     Low dose amio     labs are up to date     Plan to reduce his amiodarone again on a couple months     He does not  think he feels any different back in SR, may not need to be overly aggressive with rhythm management going forward if we see more AF    Disposition: follow up with Dr. Elon Jester in a month to f/u on meds/renal function and nephrology consult, we will see him  back in 3-24mo, remotes as usual    Current medicines are reviewed at length with the patient today.  The patient did not have any concerns regarding medicines.  Venetia Night, PA-C 04/22/2020 9:27 AM     [address] [place]  [phone] (office)  [phone] (fax)

## 2020-04-26 ENCOUNTER — Ambulatory Visit (INDEPENDENT_AMBULATORY_CARE_PROVIDER_SITE_OTHER): Payer: Medicare Other | Admitting: Physician Assistant

## 2020-04-26 ENCOUNTER — Other Ambulatory Visit: Payer: Self-pay

## 2020-04-26 ENCOUNTER — Encounter: Payer: Self-pay | Admitting: Physician Assistant

## 2020-04-26 VITALS — BP 106/64 | HR 84 | Ht 73.0 in | Wt 297.8 lb

## 2020-04-26 DIAGNOSIS — I472 Ventricular tachycardia, unspecified: Secondary | ICD-10-CM

## 2020-04-26 DIAGNOSIS — I480 Paroxysmal atrial fibrillation: Secondary | ICD-10-CM | POA: Diagnosis not present

## 2020-04-26 DIAGNOSIS — I428 Other cardiomyopathies: Secondary | ICD-10-CM

## 2020-04-26 DIAGNOSIS — I10 Essential (primary) hypertension: Secondary | ICD-10-CM | POA: Diagnosis not present

## 2020-04-26 DIAGNOSIS — Z95810 Presence of automatic (implantable) cardiac defibrillator: Secondary | ICD-10-CM

## 2020-04-26 DIAGNOSIS — I5022 Chronic systolic (congestive) heart failure: Secondary | ICD-10-CM | POA: Diagnosis not present

## 2020-04-26 LAB — CUP PACEART INCLINIC DEVICE CHECK
Battery Remaining Longevity: 84 mo
Brady Statistic RV Percent Paced: 0.15 %
Date Time Interrogation Session: 20220124172620
HighPow Impedance: 41.8348
Implantable Lead Implant Date: 20140703
Implantable Lead Location: 753860
Implantable Pulse Generator Implant Date: 20191113
Lead Channel Impedance Value: 412.5 Ohm
Lead Channel Pacing Threshold Amplitude: 0.5 V
Lead Channel Pacing Threshold Amplitude: 0.5 V
Lead Channel Pacing Threshold Pulse Width: 0.5 ms
Lead Channel Pacing Threshold Pulse Width: 0.5 ms
Lead Channel Sensing Intrinsic Amplitude: 3.4 mV
Lead Channel Setting Pacing Amplitude: 2.5 V
Lead Channel Setting Pacing Pulse Width: 0.5 ms
Lead Channel Setting Sensing Sensitivity: 0.5 mV
Pulse Gen Serial Number: 9820959

## 2020-04-26 NOTE — Patient Instructions (Signed)
Medication Instructions:   Your physician recommends that you continue on your current medications as directed. Please refer to the Current Medication list given to you today.  *If you need a refill on your cardiac medications before your next appointment, please call your pharmacy*   Lab Work: [place]   If you have labs (blood work) drawn today and your tests are completely normal, you will receive your results only by: [name] MyChart Message (if you have MyChart) OR . A paper copy in the mail If you have any lab test that is abnormal or we need to change your treatment, we will call you to review the results.   Testing/Procedures:  NONE ORDERED  TODAY   Follow-Up: At [facility], you and your health needs are our priority.  As part of our continuing mission to provide you with exceptional heart care, we have created designated Provider Care Teams.  These Care Teams include your primary Cardiologist (physician) and Advanced Practice Providers (APPs -  Physician Assistants and Nurse Practitioners) who all work together to provide you with the care you need, when you need it.  We recommend signing up for the patient portal called "MyChart".  Sign up information is provided on this After Visit Summary.  MyChart is used to connect with patients for Virtual Visits (Telemedicine).  Patients are able to view lab/test results, encounter notes, upcoming appointments, etc.  Non-urgent messages can be sent to your provider as well.   To learn more about what you can do with MyChart, go to [email or web address].    Your next appointment:  IN ONE MONTH TO TWO MONTHS WITH DR ROSS/APP  3-4  month(s)  The format for your next appointment:   In Person  Provider:   Tommye Standard, PA-C   Other Instructions

## 2020-04-29 ENCOUNTER — Other Ambulatory Visit: Payer: Self-pay | Admitting: Internal Medicine

## 2020-04-29 NOTE — Telephone Encounter (Signed)
Xarelto 20mg  refill request received. Pt is 70 years old, weight-135.1kg, Crea-2.12 on 04/22/20, last seen by Tommye Standard on 04/26/20, Diagnosis-Afib, CrCl-63.42ml/min; Dose is appropriate based on dosing criteria. Will send in refill to requested pharmacy.

## 2020-05-03 NOTE — Progress Notes (Signed)
Remote ICD transmission.   

## 2020-05-05 ENCOUNTER — Other Ambulatory Visit: Payer: Self-pay | Admitting: Physician Assistant

## 2020-05-18 ENCOUNTER — Ambulatory Visit (INDEPENDENT_AMBULATORY_CARE_PROVIDER_SITE_OTHER): Payer: Medicare Other

## 2020-05-18 DIAGNOSIS — Z95810 Presence of automatic (implantable) cardiac defibrillator: Secondary | ICD-10-CM

## 2020-05-18 DIAGNOSIS — I5022 Chronic systolic (congestive) heart failure: Secondary | ICD-10-CM

## 2020-05-24 DIAGNOSIS — M349 Systemic sclerosis, unspecified: Secondary | ICD-10-CM | POA: Diagnosis not present

## 2020-05-24 DIAGNOSIS — I4891 Unspecified atrial fibrillation: Secondary | ICD-10-CM | POA: Diagnosis not present

## 2020-05-24 DIAGNOSIS — C3490 Malignant neoplasm of unspecified part of unspecified bronchus or lung: Secondary | ICD-10-CM | POA: Diagnosis not present

## 2020-05-24 DIAGNOSIS — I10 Essential (primary) hypertension: Secondary | ICD-10-CM | POA: Diagnosis not present

## 2020-05-24 DIAGNOSIS — J449 Chronic obstructive pulmonary disease, unspecified: Secondary | ICD-10-CM | POA: Diagnosis not present

## 2020-05-24 DIAGNOSIS — E039 Hypothyroidism, unspecified: Secondary | ICD-10-CM | POA: Diagnosis not present

## 2020-05-24 DIAGNOSIS — E6609 Other obesity due to excess calories: Secondary | ICD-10-CM | POA: Diagnosis not present

## 2020-05-24 DIAGNOSIS — I130 Hypertensive heart and chronic kidney disease with heart failure and stage 1 through stage 4 chronic kidney disease, or unspecified chronic kidney disease: Secondary | ICD-10-CM | POA: Diagnosis not present

## 2020-05-26 NOTE — Progress Notes (Signed)
EPIC Encounter for ICM Monitoring  Patient Name: Jason Russell is a 70 y.o. male Date: 05/26/2020 Primary Care Physican: Lucianne Lei, MD Primary Cardiologist:Ross Electrophysiologist: Caryl Comes 12/10/2021Weight: 295lbs  Spoke with patient and reports feeling well at this time.  Denies fluid symptoms.  Patient reported his wife passed away 2020/04/19.     CorVueThoracic impedance normal but did have days suggesting possible fluid accumulation within last few weeks..  Prescribed:  Furosemide40 mg1tablet (40 mg total)daily.   Potassium 20 mEq 1 tablet daily.  Labs: 03/08/2020 Creatinine 1.66, BUN 27, Potassium 4.4, Sodium 144, GFR 42-48 12/18/2019 Creatinine1.65, BUN24, Potassium4.0, [id number], [id number] 12/01/2019 Creatinine1.66, BUN27, Potassium3.7, [id number], [id number]  10/13/2019 Creatinine1.90, BUN25, Potassium3.7, [id number], [id number] 05/30/2019 Creatinine 1.89, BUN 24, Potassium 3.8, Sodium 143, GFR 36-42 A complete set of results can be found in results review  Recommendations: No changes and encouraged to call if experiencing any fluid symptoms.  Follow-up plan: ICM clinic phone appointment on3/21/2022. 91 day device clinic remote transmission 07/19/2020.   EP/Cardiology Office Visits: 06/03/2020 with Dr Harrington Challenger.  5/2/2022with Tommye Standard, PA.  Copy of ICM check sent to [facility].   Direct Trend View through 05/23/2020   3 month ICM trend: 05/20/2020.    1 Year ICM trend:       [name], RN 05/26/2020 4:51 PM

## 2020-06-03 ENCOUNTER — Ambulatory Visit (INDEPENDENT_AMBULATORY_CARE_PROVIDER_SITE_OTHER): Payer: Medicare Other | Admitting: Internal Medicine

## 2020-06-03 ENCOUNTER — Encounter: Payer: Self-pay | Admitting: Internal Medicine

## 2020-06-03 ENCOUNTER — Other Ambulatory Visit: Payer: Self-pay

## 2020-06-03 VITALS — BP 118/66 | HR 83 | Ht 73.0 in | Wt 304.6 lb

## 2020-06-03 DIAGNOSIS — I5022 Chronic systolic (congestive) heart failure: Secondary | ICD-10-CM | POA: Diagnosis not present

## 2020-06-03 NOTE — Progress Notes (Signed)
Cardiology Office Note   Date:  06/03/2020   ID:  Jason Russell, DOB [birth year], MRN [id number]  PCP:  Lucianne Lei, MD  Cardiologist:   Dorris Carnes, MD   F/U of systolic CHF and atrial fibrillation     History of Present Illness:  Jason Russell is a 70 y.o. male with hx of nonobstructive CAD (cath 2014), HTN, OSA,lung CA (s/p XRT and chemo), PAF (on amiodarone), chronic systolic CHF  Last echo in Feb 2020 LVEF 35 to 40%     Pt is s/p ICD implant in 2014 for primary prevention.   I saw the pt back in Aug 2021   Since then he was seen by Jens Som in Jan 2022  The pt denies palpitations   He denies CP   Breathing is OK Eager to lose wt He also reports that his wife died earlier this winter     Current Meds  Medication Sig   acetaminophen (TYLENOL) 500 MG tablet Take 1,000 mg by mouth every 6 (six) hours as needed for mild pain.   albuterol (VENTOLIN HFA) 108 (90 Base) MCG/ACT inhaler INHALE 2 PUFFS INTO THE LUNGS EVERY 4 (FOUR) HOURS AS NEEDED FOR WHEEZING OR SHORTNESS OF BREATH   amiodarone (PACERONE) 200 MG tablet Take 1 tablet (200 mg total) by mouth daily.   amLODipine (NORVASC) 5 MG tablet Take by mouth.   atorvastatin (LIPITOR) 40 MG tablet Take 1 tablet (40 mg total) by mouth daily.   carvedilol (COREG) 12.5 MG tablet TAKE 1 TABLET BY MOUTH TWICE A DAY   [employer] D3 2000 units CAPS Take 2,000 Units by mouth daily.    feeding supplement, ENSURE ENLIVE, (ENSURE ENLIVE) LIQD Take 237 mLs by mouth 2 (two) times daily between meals.   ferrous gluconate (FERGON) 324 MG tablet Take 1 tablet (324 mg total) by mouth 3 (three) times daily with meals.   Fluticasone-Umeclidin-Vilant 100-62.5-25 MCG/INH AEPB TAKE 1 PUFF BY MOUTH EVERY DAY   furosemide (LASIX) 40 MG tablet Take 1 tablet (40 mg total) by mouth daily.   hydrALAZINE (APRESOLINE) 100 MG tablet TAKE 1 TABLET BY MOUTH THREE TIMES A DAY   levothyroxine (SYNTHROID) 50 MCG tablet TAKE 1 TABLET BY MOUTH DAILY BEFORE  BREAKFAST   magnesium oxide (MAG-OX) 400 (241.3 Mg) MG tablet Take 1 tablet (400 mg total) by mouth daily.   Polyvinyl Alcohol-Povidone (REFRESH OP) Place 1 drop into both eyes every morning.   Potassium Chloride ER 20 MEQ TBCR TAKE 1 TABLET BY MOUTH EVERY DAY   sacubitril-valsartan (ENTRESTO) 97-103 MG Take 1 tablet by mouth 2 (two) times daily.   sildenafil (VIAGRA) 100 MG tablet Take 1 tablet (100 mg total) by mouth daily as needed for erectile dysfunction.   spironolactone (ALDACTONE) 25 MG tablet Take 1 tablet (25 mg total) by mouth daily. Please make overdue appt with Dr. Caryl Comes before anymore refills. 1st attempt   ULORIC 40 MG tablet Take 1 tablet (40 mg total) by mouth daily.   XARELTO 20 MG TABS tablet TAKE 1 TABLET BY MOUTH EVERY DAY     Allergies:   Patient has no known allergies.   Past Medical History:  Diagnosis Date   Adenocarcinoma of right lung, stage 3 ([place]) 08/23/2016   Anemia    Arthritis    "hx right hip"   Asthma    "when I was a child"   Atrial fibrillation ([place])    Amiodarone started 10/2011; Coumadin   Automatic implantable cardioverter-defibrillator in situ 10/03/2012  a. St. Jude ICD implantation 10/03/12.   Chronic anticoagulation    Chronic fatigue 3/50/0938   Chronic systolic heart failure ([place])    a. Echo 7/13: EF 25%;  b. echo 04/2012:  Mild LVH, EF 30-35%, Gr 1 DD, mild AI, mild MR, mild LAE   COPD (chronic obstructive pulmonary disease) ([facility])    Dyslipidemia    Gout    History of blood transfusion 10/15/2013   "don't know where the blood's going; HgB down to 5"   Hyperlipidemia    Hypertension    Hypothyroidism    NICM (nonischemic cardiomyopathy) ([place])    [place] 4/14:  minimal CAD   Obesity    OSA on CPAP    Tobacco abuse     Past Surgical History:  Procedure Laterality Date   CARDIAC DEFIBRILLATOR PLACEMENT  2014   CARDIOVERSION  2011   CARDIOVERSION N/A 03/15/2020   Procedure: CARDIOVERSION;  Surgeon: Fay Records, MD;  Location: [facility];  Service: Cardiovascular;  Laterality: N/A;   COLONOSCOPY WITH PROPOFOL Left 10/17/2013   Procedure: COLONOSCOPY WITH PROPOFOL;  Surgeon: Inda Castle, MD;  Location: [facility] ENDOSCOPY;  Service: Endoscopy;  Laterality: Left;   ESOPHAGOGASTRODUODENOSCOPY N/A 10/17/2013   Procedure: ESOPHAGOGASTRODUODENOSCOPY (EGD);  Surgeon: Inda Castle, MD;  Location: [facility];  Service: Endoscopy;  Laterality: N/A;   ESOPHAGOGASTRODUODENOSCOPY (EGD) WITH PROPOFOL N/A 06/14/2018   Procedure: ESOPHAGOGASTRODUODENOSCOPY (EGD) WITH PROPOFOL;  Surgeon: Doran Stabler, MD;  Location: [facility] ENDOSCOPY;  Service: Gastroenterology;  Laterality: N/A;   GIVENS CAPSULE STUDY N/A 10/29/2013   Procedure: GIVENS CAPSULE STUDY;  Surgeon: Inda Castle, MD;  Location: [facility] ENDOSCOPY;  Service: Endoscopy;  Laterality: N/A;   ICD GENERATOR CHANGEOUT N/A 02/13/2018   Procedure: ICD GENERATOR CHANGEOUT;  Surgeon: Constance Haw, MD;  Location: [facility] CV LAB;  Service: Cardiovascular;  Laterality: N/A;   IMPLANTABLE CARDIOVERTER DEFIBRILLATOR IMPLANT Left 10/03/2012   Procedure: IMPLANTABLE CARDIOVERTER DEFIBRILLATOR IMPLANT;  Surgeon: Deboraha Sprang, MD;  Location: [facility] CATH LAB;  Service: Cardiovascular;  Laterality: Left;   IR FLUORO GUIDE PORT INSERTION RIGHT  09/21/2016   IR US GUIDE VASC ACCESS RIGHT  09/21/2016   JOINT REPLACEMENT     SAVORY DILATION N/A 06/14/2018   Procedure: SAVORY DILATION;  Surgeon: Doran Stabler, MD;  Location: [facility] ENDOSCOPY;  Service: Gastroenterology;  Laterality: N/A;   TONSILLECTOMY  1950's   TOTAL HIP ARTHROPLASTY Right 11/25/1997   VIDEO BRONCHOSCOPY WITH ENDOBRONCHIAL ULTRASOUND N/A 08/09/2016   Procedure: VIDEO BRONCHOSCOPY WITH ENDOBRONCHIAL ULTRASOUND;  Surgeon: Javier Glazier, MD;  Location: [facility];  Service: Thoracic;  Laterality: N/A;     Social History:  The patient  reports that he quit smoking about 3 years ago. His  smoking use included cigarettes. He has a 11.25 pack-year smoking history. He has never used smokeless tobacco. He reports current alcohol use of about 1.0 standard drink of alcohol per week. He reports that he does not use drugs.   Family History:  The patient's family history includes Heart Problems in his mother; Hypertension in his mother; Lung cancer in his brother; Other in his father.    ROS:  Please see the history of present illness. All other systems are reviewed and  Negative to the above problem except as noted.    PHYSICAL EXAM: VS:  BP 118/66    Pulse 83    Ht 6\' 1"  (1.854 m)    Wt (!) 304 lb 9.6 oz (138.2 kg)  BMI 40.19 kg/m   GEN: Morbidly obese 70 yo  in no acute distress  HEENT: normal  Neck: no JVD Cardiac: RRR; no murmurs, Tr LE edema  Respiratory:  clear to auscultation bilaterally, normal work of breathing GI: soft, nontender, nondistended, + BS  No hepatomegaly  MS: no deformity Moving all extremities   Skin: warm and dry, no rash Neuro:  Strength and sensation are intact Psych: euthymic mood, full affect   EKG:  EKG is ordered today.  SR with PACs  83 bpm   RBBB  LAFB    ECHO:   05/24/18  1. The left ventricle has moderately reduced systolic function, with an  ejection fraction of 35-40%. The cavity size was normal. There is mildly  increased left ventricular wall thickness. Left ventricular diastolic  Doppler parameters are consistent with  impaired relaxation.  2. The right ventricle has mildly reduced systolic function. The cavity  was small. There is no increase in right ventricular wall thickness.  3. The mitral valve is normal in structure. Mild thickening of the mitral  valve leaflet.  4. The tricuspid valve is normal in structure.  5. The aortic valve is normal in structure. Aortic valve regurgitation is  mild to moderate by color flow Doppler.  6. The interatrial septum was not assessed.   Lipid Panel    Component Value Date/Time    CHOL 117 01/28/2020 0927   TRIG 71 01/28/2020 0927   HDL 31 (L) 01/28/2020 0927   CHOLHDL 3.8 01/28/2020 0927   CHOLHDL 3 12/23/2012 1205   VLDL 14.6 12/23/2012 1205   LDLCALC 71 01/28/2020 0927      Wt Readings from Last 3 Encounters:  06/03/20 (!) 304 lb 9.6 oz (138.2 kg)  04/26/20 297 lb 12.8 oz (135.1 kg)  04/22/20 299 lb (135.6 kg)      ASSESSMENT AND PLAN:  1  Chronic systolic CHF    Last echo in 2020 LVEF 35 to 40%   Volume status looks OK on current regimen   I would continue  2  HTN   BP controlled  COntinue meds  3    CAD   Nonobstructive.  No symptoms of angina  4    Atrial fibrillation   Continue  amiodarone  At current regimen     5  Morbid obesity   Disucssed diet   Can elim sugar.  Discussed TRE   Also Optavia diet    Current medicines are reviewed at length with the patient today.  The patient does not have concerns regarding medicines.  Signed, Dorris Carnes, MD  06/03/2020 4:26 PM    [facility] [address], [place], [place]  [address] Phone: [phone]; Fax: [phone]

## 2020-06-03 NOTE — Patient Instructions (Signed)
Medication Instructions:  No changes *If you need a refill on your cardiac medications before your next appointment, please call your pharmacy*   Lab Work: none If you have labs (blood work) drawn today and your tests are completely normal, you will receive your results only by: . MyChart Message (if you have MyChart) OR . A paper copy in the mail If you have any lab test that is abnormal or we need to change your treatment, we will call you to review the results.   Testing/Procedures: none   Follow-Up: At [facility], you and your health needs are our priority.  As part of our continuing mission to provide you with exceptional heart care, we have created designated Provider Care Teams.  These Care Teams include your primary Cardiologist (physician) and Advanced Practice Providers (APPs -  Physician Assistants and Nurse Practitioners) who all work together to provide you with the care you need, when you need it.   Your next appointment:   6 month(s)  The format for your next appointment:   In Person  Provider:   You may see Paula Ross, MD or one of the following Advanced Practice Providers on your designated Care Team:    Scott Weaver, PA-C  Vin Bhagat, PA-C   Other Instructions   

## 2020-06-08 ENCOUNTER — Other Ambulatory Visit: Payer: Self-pay | Admitting: Internal Medicine

## 2020-06-09 MED ORDER — SACUBITRIL-VALSARTAN 97-103 MG PO TABS: 1.0000 | ORAL_TABLET | Freq: Two times a day (BID) | ORAL | 3 refills | Status: DC

## 2020-06-17 DIAGNOSIS — I129 Hypertensive chronic kidney disease with stage 1 through stage 4 chronic kidney disease, or unspecified chronic kidney disease: Secondary | ICD-10-CM | POA: Diagnosis not present

## 2020-06-17 DIAGNOSIS — N183 Chronic kidney disease, stage 3 unspecified: Secondary | ICD-10-CM | POA: Diagnosis not present

## 2020-06-17 DIAGNOSIS — N179 Acute kidney failure, unspecified: Secondary | ICD-10-CM | POA: Diagnosis not present

## 2020-06-21 ENCOUNTER — Other Ambulatory Visit: Payer: Self-pay | Admitting: Physician Assistant

## 2020-06-21 ENCOUNTER — Ambulatory Visit (INDEPENDENT_AMBULATORY_CARE_PROVIDER_SITE_OTHER): Payer: Medicare Other

## 2020-06-21 DIAGNOSIS — Z6839 Body mass index (BMI) 39.0-39.9, adult: Secondary | ICD-10-CM | POA: Diagnosis not present

## 2020-06-21 DIAGNOSIS — Z95810 Presence of automatic (implantable) cardiac defibrillator: Secondary | ICD-10-CM | POA: Diagnosis not present

## 2020-06-21 DIAGNOSIS — F340 Cyclothymic disorder: Secondary | ICD-10-CM | POA: Diagnosis not present

## 2020-06-21 DIAGNOSIS — I5022 Chronic systolic (congestive) heart failure: Secondary | ICD-10-CM | POA: Diagnosis not present

## 2020-06-21 DIAGNOSIS — I10 Essential (primary) hypertension: Secondary | ICD-10-CM | POA: Diagnosis not present

## 2020-06-21 DIAGNOSIS — I129 Hypertensive chronic kidney disease with stage 1 through stage 4 chronic kidney disease, or unspecified chronic kidney disease: Secondary | ICD-10-CM | POA: Diagnosis not present

## 2020-06-21 DIAGNOSIS — N183 Chronic kidney disease, stage 3 unspecified: Secondary | ICD-10-CM | POA: Diagnosis not present

## 2020-06-21 DIAGNOSIS — F4321 Adjustment disorder with depressed mood: Secondary | ICD-10-CM | POA: Diagnosis not present

## 2020-06-23 NOTE — Progress Notes (Signed)
EPIC Encounter for ICM Monitoring  Patient Name: Jason Russell is a 70 y.o. male Date: 06/23/2020 Primary Care Physican: Lucianne Lei, MD Primary Cardiologist:Ross Electrophysiologist: Caryl Comes 06/23/2020 OfficeWeight: [phone]  Spoke with patient and reports feeling well at this time.  Denies fluid symptoms.  Patient reported his wife passed away 10-May-2020.  CorVueThoracic impedancenormal.  Prescribed:  Furosemide40 mg1tablet (40 mg total)daily.   Potassium 20 mEq 1 tablet daily.  Labs: 04/22/2020 Creatinine 2.12, BUN 44, Potassium 4.4, Sodium 142, GFR 33 03/08/2020 Creatinine 1.66, BUN 27, Potassium 4.4, Sodium 144, GFR 42-48 12/18/2019 Creatinine1.65, BUN24, Potassium4.0, [id number], ESP23-30 12/01/2019 Creatinine1.66, BUN27, Potassium3.7, [id number], LKT62-56  10/13/2019 Creatinine1.90, BUN25, Potassium3.7, [id number], JGO11-57 05/30/2019 Creatinine 1.89, BUN 24, Potassium 3.8, Sodium 143, GFR 36-42 A complete set of results can be found in results review  Recommendations:No changes and encouraged to call if experiencing any fluid symptoms.  Follow-up plan: ICM clinic phone appointment on4/25/2022. 91 day device clinic remote transmission 07/19/2020.   EP/Cardiology Office Visits:   5/2/2022with Tommye Standard, [place]. Last OV with Dr Harrington Challenger was 06/03/2020 (no recall for 2023 appointment).  Copy of ICM check sent to [place].   3 month ICM trend: 06/21/2020.    1 Year ICM trend:       [name], RN 06/23/2020 10:23 AM

## 2020-06-24 ENCOUNTER — Other Ambulatory Visit: Payer: Self-pay | Admitting: Internal Medicine

## 2020-06-24 NOTE — Telephone Encounter (Signed)
Outpatient Medication Detail   Disp Refills Start End   sacubitril-valsartan (ENTRESTO) 97-103 MG 180 tablet 3 06/09/2020    Sig - Route: Take 1 tablet by mouth 2 (two) times daily. - Oral   Sent to pharmacy as: sacubitril-valsartan (ENTRESTO) 97-103 MG   Notes to Pharmacy: Dose increase   E-Prescribing Status: Receipt confirmed by pharmacy (06/09/2020 11:03 AM EST)     Pharmacy  [id number] - [place], [place]

## 2020-06-25 ENCOUNTER — Other Ambulatory Visit: Payer: Self-pay | Admitting: Orthopaedic Surgery

## 2020-06-25 DIAGNOSIS — M25511 Pain in right shoulder: Secondary | ICD-10-CM

## 2020-07-06 ENCOUNTER — Other Ambulatory Visit: Payer: Self-pay | Admitting: Internal Medicine

## 2020-07-12 ENCOUNTER — Ambulatory Visit
Admission: RE | Admit: 2020-07-12 | Discharge: 2020-07-12 | Disposition: A | Discharge status: Home / Self Care | Payer: Medicare Other | Source: Ambulatory Visit | Attending: Orthopaedic Surgery | Admitting: Orthopaedic Surgery

## 2020-07-12 ENCOUNTER — Other Ambulatory Visit: Payer: Self-pay

## 2020-07-12 DIAGNOSIS — M25511 Pain in right shoulder: Secondary | ICD-10-CM

## 2020-07-12 DIAGNOSIS — S46011A Strain of muscle(s) and tendon(s) of the rotator cuff of right shoulder, initial encounter: Secondary | ICD-10-CM | POA: Diagnosis not present

## 2020-07-12 DIAGNOSIS — M19011 Primary osteoarthritis, right shoulder: Secondary | ICD-10-CM | POA: Diagnosis not present

## 2020-07-13 DIAGNOSIS — M25511 Pain in right shoulder: Secondary | ICD-10-CM | POA: Diagnosis not present

## 2020-07-19 ENCOUNTER — Ambulatory Visit (INDEPENDENT_AMBULATORY_CARE_PROVIDER_SITE_OTHER): Payer: Medicare Other

## 2020-07-19 DIAGNOSIS — I428 Other cardiomyopathies: Secondary | ICD-10-CM | POA: Diagnosis not present

## 2020-07-20 ENCOUNTER — Telehealth: Payer: Self-pay | Admitting: *Deleted

## 2020-07-20 NOTE — Telephone Encounter (Signed)
   [facility] Pre-operative Risk Assessment    Patient Name: Jason Russell  DOB: 1950-05-04  MRN: [id number]   HEARTCARE STAFF: - Please ensure there is not already an duplicate clearance open for this procedure. - Under Visit Info/Reason for Call, type in Other and utilize the format Clearance MM/DD/YY or Clearance TBD. Do not use dashes or single digits. - If request is for dental extraction, please clarify the # of teeth to be extracted.  Request for surgical clearance:  1. What type of surgery is being performed? RIGHT TOTAL SHOULDER REPLACEMENT   2. When is this surgery scheduled? TBD   3. What type of clearance is required (medical clearance vs. Pharmacy clearance to hold med vs. Both)? BOTH  4. Are there any medications that need to be held prior to surgery and how long? [place]   5. Practice name and name of physician performing surgery? [facility]; DR. DAX VARKEY   6. What is the office phone number? [phone]   7.   What is the office fax number? [place].   Anesthesia type (None, local, MAC, general) ? CHOICE   Julaine Hua 07/20/2020, 8:34 AM  _________________________________________________________________   (provider comments below)

## 2020-07-20 NOTE — Telephone Encounter (Signed)
   Name: Jason Russell  DOB: [birth year]  MRN: [id number]   Primary Cardiologist: Dorris Carnes, MD  Chart reviewed as part of pre-operative protocol coverage. Patient was contacted 07/20/2020 in reference to pre-operative risk assessment for pending surgery as outlined below.  Jason Russell was last seen on 06/03/2020 by Dr. Harrington Challenger.  Since that day, Jason Russell has done well without any exertional chest pain or worsening dyspnea.  He is able to accomplish 4 METS of activity without any issue.  Therefore, based on ACC/AHA guidelines, the patient would be at acceptable risk for the planned procedure without further cardiovascular testing.   See below at our clinical pharmacist's recommendation regarding his Xarelto.  The patient was advised that if he develops new symptoms prior to surgery to contact our office to arrange for a follow-up visit, and he verbalized understanding.  I will route this recommendation to the requesting party via Epic fax function and remove from pre-op pool. Please call with questions.  [place], [place] 07/20/2020, 10:17 AM

## 2020-07-20 NOTE — Telephone Encounter (Signed)
Patient with diagnosis of a fib on Xarelto for anticoagulation.      RIGHT TOTAL SHOULDER REPLACEMENT   Date of procedure: TBD   CHA2DS2-VASc Score = 4  This indicates a 4.8% annual risk of stroke. The patient's score is based upon: CHF History: Yes HTN History: Yes Diabetes History: No Stroke History: No Vascular Disease History: Yes Age Score: 1 Gender Score: 0   CrCl 65 mL/min Platelet count 157K   Per office protocol, patient can hold Xarelto for 3 days prior to procedure.

## 2020-07-20 NOTE — Telephone Encounter (Signed)
Clinical pharmacist to review Xarelto 

## 2020-07-21 LAB — CUP PACEART REMOTE DEVICE CHECK
Battery Remaining Longevity: 82 mo
Battery Remaining Percentage: 76 %
Battery Voltage: 3.01 V
Brady Statistic RV Percent Paced: 1 %
Date Time Interrogation Session: 20220418020016
HighPow Impedance: 38 Ohm
HighPow Impedance: 38 Ohm
Implantable Lead Implant Date: 20140703
Implantable Lead Location: 753860
Implantable Pulse Generator Implant Date: 20191113
Lead Channel Impedance Value: 380 Ohm
Lead Channel Pacing Threshold Amplitude: 0.5 V
Lead Channel Pacing Threshold Pulse Width: 0.5 ms
Lead Channel Sensing Intrinsic Amplitude: 3.4 mV
Lead Channel Setting Pacing Amplitude: 2.5 V
Lead Channel Setting Pacing Pulse Width: 0.5 ms
Lead Channel Setting Sensing Sensitivity: 0.5 mV
Pulse Gen Serial Number: 9820959

## 2020-07-26 ENCOUNTER — Ambulatory Visit (INDEPENDENT_AMBULATORY_CARE_PROVIDER_SITE_OTHER): Payer: Medicare Other

## 2020-07-26 DIAGNOSIS — I5022 Chronic systolic (congestive) heart failure: Secondary | ICD-10-CM | POA: Diagnosis not present

## 2020-07-26 DIAGNOSIS — Z95810 Presence of automatic (implantable) cardiac defibrillator: Secondary | ICD-10-CM

## 2020-07-26 NOTE — Progress Notes (Signed)
EPIC Encounter for ICM Monitoring  Patient Name: Jason Russell is a 70 y.o. male Date: 07/26/2020 Primary Care Physican: Lucianne Lei, MD Primary Cardiologist:Ross Electrophysiologist: Caryl Comes 07/26/2020 Weight: 284lbs  Spoke with patient and reports feeling well at this time.  Denies fluid symptoms.  Change in fluid levels after Nephrologist decreased daily Furosemide dosage.  Patient reported his wife passed away1/2022.  CorVueThoracic impedancesuggesting possible fluid accumulation starting 07/24/2020.  Also decreased impedance from 4/16-4/21.  Prescribed:  Furosemide40 mg1tablet (40 mg total)daily. Per patient, Nephrologist decreased Furosemide to 20 mg daily on 06/23/2020 due to elevated creatinine  Potassium 20 mEq 1 tablet daily.  Labs: 06/21/2020 Creatinine 1.98, BUN 43, Potassium 4.1, Sodium 139, GFR 36 04/22/2020 Creatinine 2.12, BUN 44, Potassium 4.4, Sodium 142, GFR 33 03/08/2020 Creatinine 1.66, BUN 27, Potassium 4.4, Sodium 144, GFR 42-48 12/18/2019 Creatinine1.65, BUN24, Potassium4.0, [id number], [id number] 12/01/2019 Creatinine1.66, BUN27, Potassium3.7, [id number], JQG92-01  10/13/2019 Creatinine1.90, BUN25, Potassium3.7, [id number], OIT25-49 05/30/2019 Creatinine 1.89, BUN 24, Potassium 3.8, Sodium 143, GFR 36-42 A complete set of results can be found in results review  Recommendations:Discussed limiting salt intake to 2000 mg daily and fluid intake to 64 oz daily.  No changes and encouraged to call if experiencing any fluid symptoms.  Follow-up plan: ICM clinic phone appointment on5/12/2020 to recheck fluid levels. 91 day device clinic remote transmission7/18/2022.   EP/Cardiology Office Visits:  5/2/2022with Tommye Standard, [place]. Last OV with Dr Harrington Challenger was 06/03/2020 (no recall for 2023 appointment).  Copy of ICM check sent to [place].  3 month ICM trend: 07/26/2020.    1 Year ICM trend:       [name],  RN 07/26/2020 2:28 PM

## 2020-07-29 MED ORDER — SPIRONOLACTONE 25 MG PO TABS: 25.0000 mg | ORAL_TABLET | Freq: Every day | ORAL | 2 refills | Status: DC

## 2020-07-29 NOTE — Addendum Note (Signed)
Addended by: Carter Kitten D on: 07/29/2020 09:55 AM   Modules accepted: Orders

## 2020-07-29 NOTE — Telephone Encounter (Signed)
Pt's medication was sent to pt's pharmacy as requested. Confirmation received.  °

## 2020-08-01 NOTE — Progress Notes (Signed)
Cardiology Office Note Date:  08/01/2020  Patient ID:  Jason Russell, DOB May 09, 1950, MRN [id number] PCP:  Lucianne Lei, MD  Cardiologist:  Dr. Harrington Challenger Electrophysiologist: Dr. Caryl Comes Nephrology: Dr. Olivia Mackie [facility])    Chief Complaint:   planned f/u  History of Present Illness: Jason Russell is a 70 y.o. 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   He was hospitalized 10/8-10/9/18 with rapid unintentional weight loss and ICD therapy.  He reported that his wife had fallen and he was helping her up when he was shocked by his device.  No reports of symptoms associated with it.  The patient was evaluated and found to be hypokalemic and low mag as well and felt this contributed to his arhythmia, by interrogation was VT.  He also reported that he had completed his chemo and radiation was doing OK when they started immunotherapy after his first dose, became weak, anorexic and reported 20lb weight loss in 2 weeks.  The patient's electrolytes were replaced his coreg was increased and discharged home.  He comes in today to be seen for Dr. Caryl Comes, last seen by him Aug 2020.  He mentions RV pacing threshold chronically low (?), , RBBB.  Discussed no AFib and his amiodarone was reduced with plans for further reduction (to 700g/week) at his next annual visit if none again.  More recently saw Dr. Harrington Challenger Aug 2021, was doing OK, entresto was titrated up.  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 He denies any difficulties with his ADLs, no SOB, DOE. No CP, palpitations or cardiac awareness. No dizzy spells, near syncope or syncope. He reports compliacne with his medicines, discussed his xarelto specifically, says he never misses. No bleeding or signs of bleeding. He was in rate controlled AFib, not overly symptomatic, perhaps  with vague sense of reduced energy, his amiodarone increased to 200mg  daily (from 100mg  daily) and planned for DCCV.  DCCV 03/15/20 was succesful.  I saw him 04/26/20 He is doing well.  Does not think he can tell much if any improvement post DCCV. He denies any CP, palpitations or cardiac awareness No dizzy spells, near syncope or syncope. NO bleeding or signs of bleeding He saw his oncologist who did labs and has been referred to a nephrologist, Dr. Thereasa Distance but has not gotten an appointment yet. He does not weigh daily, does not think he has gained, does not feel bloated or swollen  HF meds were deferred to Dr. Harrington Challenger with recent increase in Creat and pending nephrology consult.  Planned to further down titrate his amio in a couple months, given he felt the same in SR as he did in AF, ? Stop eventually.  He saw Dr. Harrington Challenger 3/3/222, no changes were made, planned/discussed weight loss strategies.   TODAY He is doing well. He does think after all that he probably feel better in SR, does not think he has had Afib since his DCCV in Dec. No CP, palpitations or cardiac awareness No SOB, denies DOE Uses CPAP at night and denies symptoms of orthopnea or PND. No dizzy spells, near syncope or syncope.    Device information: SJM single chamber ICD, implanted 10/03/12 > gen change Nov 2019 for early gen depletion + hx of appropriate tx Oct 2018 for VT AAD Tx:  Amiodarone (goes as  far back as his Epic chart (2013) seems for his AF   Past Medical History:  Diagnosis Date  . Adenocarcinoma of right lung, stage 3 ([place]) 08/23/2016  . Anemia   . Arthritis    "hx right hip"  . Asthma    "when I was a child"  . Atrial fibrillation (HCC)    Amiodarone started 10/2011; Coumadin  . 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 St. Jude ICD implantation 10/03/12.  . Chronic anticoagulation   . Chronic fatigue 10/18/2016  . Chronic systolic heart failure ([place])    a.  Echo 7/13: EF 25%;  b. echo 04/2012:  Mild LVH, EF 30-35%, Gr 1 DD, mild AI, mild MR, mild LAE  . COPD (chronic obstructive pulmonary disease) ([place])   . Dyslipidemia   . Gout   . History of blood transfusion 10/15/2013   "don't know where the blood's going; HgB down to 5"  . Hyperlipidemia   . Hypertension   . Hypothyroidism   . NICM (nonischemic cardiomyopathy) ([place])    [place] 4/14:  minimal CAD  . Obesity   . OSA on CPAP   . Tobacco abuse     Past Surgical History:  Procedure Laterality Date  . CARDIAC DEFIBRILLATOR PLACEMENT  2014  . CARDIOVERSION  2011  . CARDIOVERSION N/A 03/15/2020   Procedure: CARDIOVERSION;  Surgeon: Fay Records, MD;  Location: [place];  Service: Cardiovascular;  Laterality: N/A;  . COLONOSCOPY WITH PROPOFOL Left 10/17/2013   Procedure: COLONOSCOPY WITH PROPOFOL;  Surgeon: Inda Castle, MD;  Location: [place];  Service: Endoscopy;  Laterality: Left;  . ESOPHAGOGASTRODUODENOSCOPY N/A 10/17/2013   Procedure: ESOPHAGOGASTRODUODENOSCOPY (EGD);  Surgeon: Inda Castle, MD;  Location: [place];  Service: Endoscopy;  Laterality: N/A;  . ESOPHAGOGASTRODUODENOSCOPY (EGD) WITH PROPOFOL N/A 06/14/2018   Procedure: ESOPHAGOGASTRODUODENOSCOPY (EGD) WITH PROPOFOL;  Surgeon: Doran Stabler, MD;  Location: [facility];  Service: Gastroenterology;  Laterality: N/A;  . GIVENS CAPSULE STUDY N/A 10/29/2013   Procedure: GIVENS CAPSULE STUDY;  Surgeon: Inda Castle, MD;  Location: [facility];  Service: Endoscopy;  Laterality: N/A;  . ICD GENERATOR CHANGEOUT N/A 02/13/2018   Procedure: ICD GENERATOR CHANGEOUT;  Surgeon: Constance Haw, MD;  Location: [place] CV LAB;  Service: Cardiovascular;  Laterality: N/A;  . IMPLANTABLE CARDIOVERTER DEFIBRILLATOR IMPLANT Left 10/03/2012   Procedure: IMPLANTABLE CARDIOVERTER DEFIBRILLATOR IMPLANT;  Surgeon: Deboraha Sprang, MD;  Location: [facility] CATH LAB;  Service: Cardiovascular;  Laterality: Left;  . IR FLUORO GUIDE  PORT INSERTION RIGHT  09/21/2016  . IR US GUIDE VASC ACCESS RIGHT  09/21/2016  . JOINT REPLACEMENT    . SAVORY DILATION N/A 06/14/2018   Procedure: SAVORY DILATION;  Surgeon: Doran Stabler, MD;  Location: [facility];  Service: Gastroenterology;  Laterality: N/A;  . TONSILLECTOMY  1950's  . TOTAL HIP ARTHROPLASTY Right 11/25/1997  . VIDEO BRONCHOSCOPY WITH ENDOBRONCHIAL ULTRASOUND N/A 08/09/2016   Procedure: VIDEO BRONCHOSCOPY WITH ENDOBRONCHIAL ULTRASOUND;  Surgeon: Javier Glazier, MD;  Location: [facility] OR;  Service: Thoracic;  Laterality: N/A;    Current Outpatient Medications  Medication Sig Dispense Refill  . acetaminophen (TYLENOL) 500 MG tablet Take 1,000 mg by mouth every 6 (six) hours as needed for mild pain.    [name] albuterol (VENTOLIN HFA) 108 (90 Base) MCG/ACT inhaler INHALE 2 PUFFS INTO THE LUNGS EVERY 4 (FOUR) HOURS AS NEEDED FOR WHEEZING OR SHORTNESS OF BREATH 18 g 2  . amiodarone (PACERONE) 200 MG tablet  TAKE 1 TABLET BY MOUTH EVERY DAY 90 tablet 1  . amLODipine (NORVASC) 5 MG tablet Take by mouth.    [name] atorvastatin (LIPITOR) 40 MG tablet Take 1 tablet (40 mg total) by mouth daily. 90 tablet 3  . carvedilol (COREG) 12.5 MG tablet TAKE 1 TABLET BY MOUTH TWICE A DAY 180 tablet 3  . CVS D3 2000 units CAPS Take 2,000 Units by mouth daily.   11  . feeding supplement, ENSURE ENLIVE, (ENSURE ENLIVE) LIQD Take 237 mLs by mouth 2 (two) times daily between meals. 237 mL 12  . ferrous gluconate (FERGON) 324 MG tablet Take 1 tablet (324 mg total) by mouth 3 (three) times daily with meals. 270 tablet 3  . Fluticasone-Umeclidin-Vilant 100-62.5-25 MCG/INH AEPB TAKE 1 PUFF BY MOUTH EVERY DAY 180 each 11  . furosemide (LASIX) 40 MG tablet Take 1 tablet (40 mg total) by mouth daily. 90 tablet 3  . hydrALAZINE (APRESOLINE) 100 MG tablet TAKE 1 TABLET BY MOUTH THREE TIMES A DAY 270 tablet 2  . levothyroxine (SYNTHROID) 50 MCG tablet TAKE 1 TABLET BY MOUTH DAILY BEFORE BREAKFAST 90 tablet 3  .  magnesium oxide (MAG-OX) 400 (241.3 Mg) MG tablet Take 1 tablet (400 mg total) by mouth daily. 30 tablet 0  . Polyvinyl Alcohol-Povidone (REFRESH OP) Place 1 drop into both eyes every morning.    . Potassium Chloride ER 20 MEQ TBCR TAKE 1 TABLET BY MOUTH EVERY DAY 90 tablet 2  . sacubitril-valsartan (ENTRESTO) 97-103 MG Take 1 tablet by mouth 2 (two) times daily. 180 tablet 3  . sildenafil (VIAGRA) 100 MG tablet Take 1 tablet (100 mg total) by mouth daily as needed for erectile dysfunction. 10 tablet 1  . spironolactone (ALDACTONE) 25 MG tablet Take 1 tablet (25 mg total) by mouth daily. 90 tablet 2  . ULORIC 40 MG tablet Take 1 tablet (40 mg total) by mouth daily. 30 tablet 0  . XARELTO 20 MG TABS tablet TAKE 1 TABLET BY MOUTH EVERY DAY 90 tablet 3   No current facility-administered medications for this visit.    Allergies:   Patient has no known allergies.   Social History:  The patient  reports that he quit smoking about 4 years ago. His smoking use included cigarettes. He has a 11.25 pack-year smoking history. He has never used smokeless tobacco. He reports current alcohol use of about 1.0 standard drink of alcohol per week. He reports that he does not use drugs.   Family History:  The patient's family history includes Heart Problems in his mother; Hypertension in his mother; Lung cancer in his brother; Other in his father.  ROS:  Please see the history of present illness.  All other systems are reviewed and otherwise negative.   PHYSICAL EXAM:  VS:  There were no vitals taken for this visit. BMI: There is no height or weight on file to calculate BMI. Well nourished, well developed, in no acute distress  HEENT: normocephalic, atraumatic  Neck: no JVD, carotid bruits or masses Cardiac: RRR; no significant murmurs, no rubs, or gallops Lungs:   CTA b/l, no wheezing, rhonchi or rales  Abd: soft, non-tender, obese MS: no deformity or atrophy Ext:  trace if anyedema  Skin: warm and dry,  no rash Neuro:  No gross deficits appreciated Psych: euthymic mood, full affect  ICD site is stable, no tethering or discomfort   EKG:   Done today and reviewed by myself SR,63bpm, RBBB, PVC  ICD interrogation done today  and reviewed by myself: Battery and lead measurements are stable No arrhythmias <1% VP  05/24/2018: TTE IMPRESSIONS  1. The left ventricle has moderately reduced systolic function, with an  ejection fraction of 35-40%. The cavity size was normal. There is mildly  increased left ventricular wall thickness. Left ventricular diastolic  Doppler parameters are consistent with  impaired relaxation.  2. The right ventricle has mildly reduced systolic function. The cavity  was small. There is no increase in right ventricular wall thickness.  3. The mitral valve is normal in structure. Mild thickening of the mitral  valve leaflet.  4. The tricuspid valve is normal in structure.  5. The aortic valve is normal in structure. Aortic valve regurgitation is  mild to moderate by color flow Doppler.  6. The interatrial septum was not assessed.   12/28/14: TTE Study Conclusions - Left ventricle: The cavity size was moderately dilated. There was   mild concentric hypertrophy. Systolic function was moderately to   severely reduced. The estimated ejection fraction was in the   range of 30% to 35%. Moderate diffuse hypokinesis with no   identifiable regional variations. Features are consistent with a   pseudonormal left ventricular filling pattern, with concomitant   abnormal relaxation and increased filling pressure (grade 2   diastolic dysfunction). - Aortic valve: There was mild to moderate regurgitation. - Left atrium: The atrium was moderately dilated. - Right ventricle: The cavity size was mildly dilated. Wall   thickness was normal. - Atrial septum: There was an atrial septal aneurysm. - Pulmonary arteries: Systolic pressure was mildly increased. PA   peak  pressure: 37 mm Hg (S).   Recent Labs: 12/01/2019: Magnesium 2.2; TSH 2.100 04/22/2020: ALT 20; BUN 44; Creatinine 2.12; Hemoglobin 11.6; Platelet Count 157; Potassium 4.4; Sodium 142  01/28/2020: Chol/HDL Ratio 3.8; Cholesterol, Total 117; HDL 31; LDL Chol Calc (NIH) 71; Triglycerides 71   CrCl cannot be calculated (Patient's most recent lab result is older than the maximum 21 days allowed.).   Wt Readings from Last 3 Encounters:  06/03/20 (!) 304 lb 9.6 oz (138.2 kg)  04/26/20 297 lb 12.8 oz (135.1 kg)  04/22/20 299 lb (135.6 kg)     Other studies reviewed: Additional studies/records reviewed today include: summarized above  ASSESSMENT AND PLAN:  1. ICD     Intact function, no programming changes made  2. VT     None noted by today's device interrogation  3. NICM     Chronic CHF (systolic)     LVEF 70-26% by last echo     On good meds     CorVue would wobbles above/below threshold, no real change in behavior since adjustment in his lasix w/nephrology, in review of the year has been above/below threshold intermittently all along      No symptoms of volume OL     Only tace if edema,      Weight is stable, down 6lbs from last weight       4. HTN     Looks OK, no changes  5. Paroxysmal Afib     CHA2DS2Vasc is at least 3, on xarelto, remains appropriately dosed with the last Creat (care everywhere     Reduce amiodarone to Monday-Friday, none on weekends     amio labs up to date     labs are up to date        Disposition: remotes as usual, in clinic in 80mo with EP, sooner if needed   Current medicines  are reviewed at length with the patient today.  The patient did not have any concerns regarding medicines.  Venetia Night, PA-C 08/01/2020 9:54 AM     [facility] [address] [place] [address] [phone] (office)  [phone] (fax)

## 2020-08-02 ENCOUNTER — Other Ambulatory Visit: Payer: Self-pay

## 2020-08-02 ENCOUNTER — Encounter: Payer: Self-pay | Admitting: Physician Assistant

## 2020-08-02 ENCOUNTER — Ambulatory Visit (INDEPENDENT_AMBULATORY_CARE_PROVIDER_SITE_OTHER): Payer: Medicare Other | Admitting: Physician Assistant

## 2020-08-02 VITALS — BP 118/64 | HR 78 | Ht 73.0 in | Wt 298.0 lb

## 2020-08-02 DIAGNOSIS — I472 Ventricular tachycardia, unspecified: Secondary | ICD-10-CM

## 2020-08-02 DIAGNOSIS — I5022 Chronic systolic (congestive) heart failure: Secondary | ICD-10-CM | POA: Diagnosis not present

## 2020-08-02 DIAGNOSIS — I428 Other cardiomyopathies: Secondary | ICD-10-CM

## 2020-08-02 DIAGNOSIS — Z95810 Presence of automatic (implantable) cardiac defibrillator: Secondary | ICD-10-CM | POA: Diagnosis not present

## 2020-08-02 DIAGNOSIS — R5382 Chronic fatigue, unspecified: Secondary | ICD-10-CM | POA: Diagnosis not present

## 2020-08-02 DIAGNOSIS — I480 Paroxysmal atrial fibrillation: Secondary | ICD-10-CM | POA: Diagnosis not present

## 2020-08-02 LAB — CUP PACEART INCLINIC DEVICE CHECK
Battery Remaining Longevity: 84 mo
Brady Statistic RV Percent Paced: 0.04 %
Date Time Interrogation Session: 20220502155651
HighPow Impedance: 38.8125
Implantable Lead Implant Date: 20140703
Implantable Lead Location: 753860
Implantable Pulse Generator Implant Date: 20191113
Lead Channel Impedance Value: 375 Ohm
Lead Channel Pacing Threshold Amplitude: 0.5 V
Lead Channel Pacing Threshold Amplitude: 0.5 V
Lead Channel Pacing Threshold Pulse Width: 0.5 ms
Lead Channel Pacing Threshold Pulse Width: 0.5 ms
Lead Channel Sensing Intrinsic Amplitude: 2.9 mV
Lead Channel Setting Pacing Amplitude: 2.5 V
Lead Channel Setting Pacing Pulse Width: 0.5 ms
Lead Channel Setting Sensing Sensitivity: 0.5 mV
Pulse Gen Serial Number: 9820959

## 2020-08-02 MED ORDER — AMIODARONE HCL 200 MG PO TABS: 200.0000 mg | ORAL_TABLET | Freq: Every day | ORAL | 1 refills | Status: DC

## 2020-08-02 NOTE — Patient Instructions (Signed)
Medication Instructions:   Your physician recommends that you continue on your current medications as directed. Please refer to the Current Medication list given to you today.  TAKE A LOOK AT AMIODARONE DAYS TO TAKE ONLY MON - FRI  NO WEEKENDS   *If you need a refill on your cardiac medications before your next appointment, please call your pharmacy*   Lab Work: NONE ORDERED  TODAY   If you have labs (blood work) drawn today and your tests are completely normal, you will receive your results only by: [name] MyChart Message (if you have MyChart) OR . A paper copy in the mail If you have any lab test that is abnormal or we need to change your treatment, we will call you to review the results.   Testing/Procedures: NONE ORDERED  TODAY    Follow-Up: At [facility], you and your health needs are our priority.  As part of our continuing mission to provide you with exceptional heart care, we have created designated Provider Care Teams.  These Care Teams include your primary Cardiologist (physician) and Advanced Practice Providers (APPs -  Physician Assistants and Nurse Practitioners) who all work together to provide you with the care you need, when you need it.  We recommend signing up for the patient portal called "MyChart".  Sign up information is provided on this After Visit Summary.  MyChart is used to connect with patients for Virtual Visits (Telemedicine).  Patients are able to view lab/test results, encounter notes, upcoming appointments, etc.  Non-urgent messages can be sent to your provider as well.   To learn more about what you can do with MyChart, go to [email or web address].    Your next appointment:   6 month(s)  The format for your next appointment:   In Person  Provider:   You may see Dr. Caryl Comes or one of the following Advanced Practice Providers on your designated Care Team:    Chanetta Marshall, NP  Tommye Standard, PA-C  Legrand Como "[place]da [place], [place]    Other  Instructions

## 2020-08-04 DIAGNOSIS — E039 Hypothyroidism, unspecified: Secondary | ICD-10-CM | POA: Diagnosis not present

## 2020-08-04 DIAGNOSIS — I130 Hypertensive heart and chronic kidney disease with heart failure and stage 1 through stage 4 chronic kidney disease, or unspecified chronic kidney disease: Secondary | ICD-10-CM | POA: Diagnosis not present

## 2020-08-04 DIAGNOSIS — R7309 Other abnormal glucose: Secondary | ICD-10-CM | POA: Diagnosis not present

## 2020-08-04 DIAGNOSIS — L2089 Other atopic dermatitis: Secondary | ICD-10-CM | POA: Diagnosis not present

## 2020-08-04 NOTE — Progress Notes (Signed)
Remote ICD transmission.   

## 2020-08-09 ENCOUNTER — Ambulatory Visit (INDEPENDENT_AMBULATORY_CARE_PROVIDER_SITE_OTHER): Payer: Medicare Other

## 2020-08-09 DIAGNOSIS — Z95810 Presence of automatic (implantable) cardiac defibrillator: Secondary | ICD-10-CM

## 2020-08-09 DIAGNOSIS — I5022 Chronic systolic (congestive) heart failure: Secondary | ICD-10-CM

## 2020-08-09 NOTE — Progress Notes (Signed)
EPIC Encounter for ICM Monitoring  Patient Name: Jason Russell is a 70 y.o. male Date: 08/09/2020 Primary Care Physican: Lucianne Lei, MD Primary Cardiologist:Ross Electrophysiologist: Caryl Comes 07/26/2020 Weight:284lbs  Spoke with patient and reports feeling well at this time. Denies fluid symptoms.  Patient reported his wife passed away1/2022.  CorVueThoracic impedancesuggesting fluid levels returned to normal.  Prescribed:  Furosemide40 mg1tablet (40 mg total)daily. Per patient, Nephrologist decreased Furosemide to 20 mg daily on 06/23/2020 due to elevated creatinine  Potassium 20 mEq 1 tablet daily.  Labs: 06/21/2020 Creatinine 1.98, BUN 43, Potassium 4.1, Sodium 139, GFR 36 04/22/2020 Creatinine 2.12, BUN 44, Potassium 4.4, Sodium 142, GFR 33 03/08/2020 Creatinine 1.66, BUN 27, Potassium 4.4, Sodium 144, GFR 42-48 12/18/2019 Creatinine1.65, BUN24, Potassium4.0, [id number], [id number] 12/01/2019 Creatinine1.66, BUN27, Potassium3.7, [id number], [id number]  10/13/2019 Creatinine1.90, BUN25, Potassium3.7, [id number], [id number] 05/30/2019 Creatinine 1.89, BUN 24, Potassium 3.8, Sodium 143, GFR 36-42 A complete set of results can be found in results review  Recommendations: No changes and encouraged to call if experiencing any fluid symptoms.  Follow-up plan: ICM clinic phone appointment on6/20/2022. 91 day device clinic remote transmission7/18/2022.   EP/Cardiology Office Visits:  Last OV with Dr Harrington Challenger was 06/03/2020 (no recall for 2023 appointment).  Copy of ICM check sent to [place].  3 month ICM trend: 08/08/2020.    [name], RN 08/09/2020 2:58 PM

## 2020-08-10 ENCOUNTER — Other Ambulatory Visit: Payer: Self-pay | Admitting: Adult Health

## 2020-08-11 DIAGNOSIS — N1832 Chronic kidney disease, stage 3b: Secondary | ICD-10-CM | POA: Diagnosis not present

## 2020-08-11 DIAGNOSIS — N179 Acute kidney failure, unspecified: Secondary | ICD-10-CM | POA: Diagnosis not present

## 2020-08-11 DIAGNOSIS — E889 Metabolic disorder, unspecified: Secondary | ICD-10-CM | POA: Diagnosis not present

## 2020-08-11 DIAGNOSIS — D631 Anemia in chronic kidney disease: Secondary | ICD-10-CM | POA: Diagnosis not present

## 2020-08-11 DIAGNOSIS — I129 Hypertensive chronic kidney disease with stage 1 through stage 4 chronic kidney disease, or unspecified chronic kidney disease: Secondary | ICD-10-CM | POA: Diagnosis not present

## 2020-08-11 DIAGNOSIS — E559 Vitamin D deficiency, unspecified: Secondary | ICD-10-CM | POA: Diagnosis not present

## 2020-08-11 DIAGNOSIS — M9080 Osteopathy in diseases classified elsewhere, unspecified site: Secondary | ICD-10-CM | POA: Diagnosis not present

## 2020-08-11 DIAGNOSIS — N183 Chronic kidney disease, stage 3 unspecified: Secondary | ICD-10-CM | POA: Diagnosis not present

## 2020-08-17 DIAGNOSIS — F4321 Adjustment disorder with depressed mood: Secondary | ICD-10-CM | POA: Diagnosis not present

## 2020-08-17 DIAGNOSIS — I4891 Unspecified atrial fibrillation: Secondary | ICD-10-CM | POA: Diagnosis not present

## 2020-08-17 DIAGNOSIS — I10 Essential (primary) hypertension: Secondary | ICD-10-CM | POA: Diagnosis not present

## 2020-08-17 DIAGNOSIS — Z0001 Encounter for general adult medical examination with abnormal findings: Secondary | ICD-10-CM | POA: Diagnosis not present

## 2020-08-17 DIAGNOSIS — R7309 Other abnormal glucose: Secondary | ICD-10-CM | POA: Diagnosis not present

## 2020-09-10 DIAGNOSIS — Z20822 Contact with and (suspected) exposure to covid-19: Secondary | ICD-10-CM | POA: Diagnosis not present

## 2020-09-14 ENCOUNTER — Other Ambulatory Visit: Payer: Self-pay | Admitting: Adult Health

## 2020-09-14 NOTE — Progress Notes (Signed)
DUE TO COVID-19 ONLY ONE VISITOR IS ALLOWED TO COME WITH YOU AND STAY IN THE WAITING ROOM ONLY DURING PRE OP AND PROCEDURE DAY OF SURGERY. THE 1 VISITOR  MAY VISIT WITH YOU AFTER SURGERY IN YOUR PRIVATE ROOM DURING VISITING HOURS ONLY!  YOU NEED TO HAVE A COVID 19 TEST ON__6/20/2022 _____ @_______ , THIS TEST MUST BE DONE BEFORE SURGERY,  COVID TESTING SITE [address] [place] [address], IT IS ON THE RIGHT GOING OUT [address] APPROXIMATELY  2 MINUTES PAST [employer] ON THE RIGHT. ONCE YOUR COVID TEST IS COMPLETED,  PLEASE BEGIN THE QUARANTINE INSTRUCTIONS AS OUTLINED IN YOUR HANDOUT.                Jason Russell  09/14/2020   Your procedure is scheduled on:        09/22/20   Report to [facility] Main  Entrance   Report to admitting at       [phone]     Call this number if you have problems the morning of surgery [phone]    REMEMBER: NO  SOLID FOOD CANDY OR GUM AFTER MIDNIGHT. CLEAR LIQUIDS UNTIL   0645am         . NOTHING BY MOUTH EXCEPT CLEAR LIQUIDS UNTIL    0645am    . PLEASE FINISH ENSURE DRINK PER SURGEON ORDER  WHICH NEEDS TO BE COMPLETED AT    0645am     .      CLEAR LIQUID DIET   Foods Allowed                                                                    Coffee and tea, regular and decaf                            Fruit ices (not with fruit pulp)                                      Iced Popsicles                                    Carbonated beverages, regular and diet                                    Cranberry, grape and apple juices Sports drinks like Gatorade Lightly seasoned clear broth or consume(fat free) Sugar, honey syrup ___________________________________________________________________      BRUSH YOUR TEETH MORNING OF SURGERY AND RINSE YOUR MOUTH OUT, NO CHEWING GUM CANDY OR MINTS.     Take these medicines the morning of surgery with A SIP OF WATER:       Uloric, synthroid, hydralazine, coreg, inhlaers as  usual and bring , pacerone, amlodipine  DO NOT TAKE ANY DIABETIC MEDICATIONS DAY OF YOUR SURGERY  You may not have any metal on your body including hair pins and              piercings  Do not wear jewelry, make-up, lotions, powders or perfumes, deodorant             Do not wear nail polish on your fingernails.  Do not shave  48 hours prior to surgery.              Men may shave face and neck.   Do not bring valuables to the hospital. [place].  Contacts, dentures or bridgework may not be worn into surgery.  Leave suitcase in the car. After surgery it may be brought to your room.     Patients discharged the day of surgery will not be allowed to drive home. IF YOU ARE HAVING SURGERY AND GOING HOME THE SAME DAY, YOU MUST HAVE AN ADULT TO DRIVE YOU HOME AND BE WITH YOU FOR 24 HOURS. YOU MAY GO HOME BY TAXI OR UBER OR ORTHERWISE, BUT AN ADULT MUST ACCOMPANY YOU HOME AND STAY WITH YOU FOR 24 HOURS.  Name and phone number of your driver:  Special Instructions: N/A              Please read over the following fact sheets you were given: _____________________________________________________________________  [facility] - Preparing for Surgery Before surgery, you can play an important role.  Because skin is not sterile, your skin needs to be as free of germs as possible.  You can reduce the number of germs on your skin by washing with CHG (chlorahexidine gluconate) soap before surgery.  CHG is an antiseptic cleaner which kills germs and bonds with the skin to continue killing germs even after washing. Please DO NOT use if you have an allergy to CHG or antibacterial soaps.  If your skin becomes reddened/irritated stop using the CHG and inform your nurse when you arrive at Short Stay. Do not shave (including legs and underarms) for at least 48 hours prior to the first CHG shower.  You may shave your face/neck. Please follow  these instructions carefully:  1.  Shower with CHG Soap the night before surgery and the  morning of Surgery.  2.  If you choose to wash your hair, wash your hair first as usual with your  normal  shampoo.  3.  After you shampoo, rinse your hair and body thoroughly to remove the  shampoo.                           4.  Use CHG as you would any other liquid soap.  You can apply chg directly  to the skin and wash                       Gently with a scrungie or clean washcloth.  5.  Apply the CHG Soap to your body ONLY FROM THE NECK DOWN.   Do not use on face/ open                           Wound or open sores. Avoid contact with eyes, ears mouth and genitals (private parts).  Wash face,  Genitals (private parts) with your normal soap.             6.  Wash thoroughly, paying special attention to the area where your surgery  will be performed.  7.  Thoroughly rinse your body with warm water from the neck down.  8.  DO NOT shower/wash with your normal soap after using and rinsing off  the CHG Soap.                9.  Pat yourself dry with a clean towel.            10.  Wear clean pajamas.            11.  Place clean sheets on your bed the night of your first shower and do not  sleep with pets. Day of Surgery : Do not apply any lotions/deodorants the morning of surgery.  Please wear clean clothes to the hospital/surgery center.  FAILURE TO FOLLOW THESE INSTRUCTIONS MAY RESULT IN THE CANCELLATION OF YOUR SURGERY PATIENT SIGNATURE_________________________________  NURSE SIGNATURE__________________________________  ________________________________________________________________________              [facility]- Preparing for Total Shoulder Arthroplasty    Before surgery, you can play an important role. Because skin is not sterile, your skin needs to be as free of germs as possible. You can reduce the number of germs on your skin by using the following products. Benzoyl  Peroxide Gel Reduces the number of germs present on the skin Applied twice a day to shoulder area starting two days before surgery    ==================================================================  Please follow these instructions carefully:  BENZOYL PEROXIDE 5% GEL  Please do not use if you have an allergy to benzoyl peroxide.   If your skin becomes reddened/irritated stop using the benzoyl peroxide.  Starting two days before surgery, apply as follows: Apply benzoyl peroxide in the morning and at night. Apply after taking a shower. If you are not taking a shower clean entire shoulder front, back, and side along with the armpit with a clean wet washcloth.  Place a quarter-sized dollop on your shoulder and rub in thoroughly, making sure to cover the front, back, and side of your shoulder, along with the armpit.   2 days before ____ AM   ____ PM              1 day before ____ AM   ____ PM                         Do this twice a day for two days.  (Last application is the night before surgery, AFTER using the CHG soap as described below).  Do NOT apply benzoyl peroxide gel on the day of surgery.

## 2020-09-16 ENCOUNTER — Encounter (HOSPITAL_COMMUNITY)
Admission: RE | Admit: 2020-09-16 | Discharge: 2020-09-16 | Disposition: A | Discharge status: Home / Self Care | Payer: Medicare Other | Source: Ambulatory Visit | Attending: Orthopaedic Surgery | Admitting: Orthopaedic Surgery

## 2020-09-16 ENCOUNTER — Encounter (HOSPITAL_COMMUNITY): Payer: Self-pay

## 2020-09-16 ENCOUNTER — Other Ambulatory Visit: Payer: Self-pay

## 2020-09-16 DIAGNOSIS — I4891 Unspecified atrial fibrillation: Secondary | ICD-10-CM | POA: Insufficient documentation

## 2020-09-16 DIAGNOSIS — Z87891 Personal history of nicotine dependence: Secondary | ICD-10-CM | POA: Insufficient documentation

## 2020-09-16 DIAGNOSIS — Z79899 Other long term (current) drug therapy: Secondary | ICD-10-CM | POA: Insufficient documentation

## 2020-09-16 DIAGNOSIS — Z7901 Long term (current) use of anticoagulants: Secondary | ICD-10-CM | POA: Insufficient documentation

## 2020-09-16 DIAGNOSIS — E039 Hypothyroidism, unspecified: Secondary | ICD-10-CM | POA: Insufficient documentation

## 2020-09-16 DIAGNOSIS — M19011 Primary osteoarthritis, right shoulder: Secondary | ICD-10-CM | POA: Diagnosis not present

## 2020-09-16 DIAGNOSIS — G4733 Obstructive sleep apnea (adult) (pediatric): Secondary | ICD-10-CM | POA: Diagnosis not present

## 2020-09-16 DIAGNOSIS — N183 Chronic kidney disease, stage 3 unspecified: Secondary | ICD-10-CM | POA: Diagnosis not present

## 2020-09-16 DIAGNOSIS — Z79890 Hormone replacement therapy (postmenopausal): Secondary | ICD-10-CM | POA: Insufficient documentation

## 2020-09-16 DIAGNOSIS — Z95810 Presence of automatic (implantable) cardiac defibrillator: Secondary | ICD-10-CM | POA: Insufficient documentation

## 2020-09-16 DIAGNOSIS — I129 Hypertensive chronic kidney disease with stage 1 through stage 4 chronic kidney disease, or unspecified chronic kidney disease: Secondary | ICD-10-CM | POA: Diagnosis not present

## 2020-09-16 DIAGNOSIS — I428 Other cardiomyopathies: Secondary | ICD-10-CM | POA: Diagnosis not present

## 2020-09-16 DIAGNOSIS — Z01812 Encounter for preprocedural laboratory examination: Secondary | ICD-10-CM | POA: Diagnosis not present

## 2020-09-16 LAB — CBC
HCT: 38.5 % — ABNORMAL LOW (ref 39.0–52.0)
Hemoglobin: 12.4 g/dL — ABNORMAL LOW (ref 13.0–17.0)
MCH: 29.7 pg (ref 26.0–34.0)
MCHC: 32.2 g/dL (ref 30.0–36.0)
MCV: 92.3 fL (ref 80.0–100.0)
Platelets: 171 10*3/uL (ref 150–400)
RBC: 4.17 MIL/uL — ABNORMAL LOW (ref 4.22–5.81)
RDW: 14.6 % (ref 11.5–15.5)
WBC: 4.6 10*3/uL (ref 4.0–10.5)
nRBC: 0 % (ref 0.0–0.2)

## 2020-09-16 LAB — BASIC METABOLIC PANEL
Anion gap: 6 (ref 5–15)
BUN: 39 mg/dL — ABNORMAL HIGH (ref 8–23)
CO2: 27 mmol/L (ref 22–32)
Calcium: 9.2 mg/dL (ref 8.9–10.3)
Chloride: 105 mmol/L (ref 98–111)
Creatinine, Ser: 1.87 mg/dL — ABNORMAL HIGH (ref 0.61–1.24)
GFR, Estimated: 39 mL/min — ABNORMAL LOW (ref >60)
Glucose, Bld: 97 mg/dL (ref 70–99)
Potassium: 3.7 mmol/L (ref 3.5–5.1)
Sodium: 138 mmol/L (ref 135–145)

## 2020-09-16 LAB — SURGICAL PCR SCREEN
MRSA, PCR: NEGATIVE
Staphylococcus aureus: NEGATIVE

## 2020-09-16 NOTE — Progress Notes (Signed)
Anesthesia Chart Review   Case: [id number] Date/Time: 09/22/20 0945   Procedure: REVERSE SHOULDER ARTHROPLASTY (Right: Shoulder)   Anesthesia type: Choice   Pre-op diagnosis: djd right shoulder   Location: [facility] ROOM 06 / [facility]   Surgeons: Hiram Gash, MD       DISCUSSION:70 y.o. former smoker with h/o HTN, OSA on CPAP, hypothyroidism, CKD Stage III, atrial fibrillation (on Xarelto), NICM with ICD in place, lung cancer, right shoulder djd scheduled for above procedure 09/22/20 with Dr. Ophelia Charter.   Pt last seen by cardiology 08/02/2020. Stable at this visit.    Per cardiology preoperative evaluation 07/20/2020, "Chart reviewed as part of pre-operative protocol coverage. Patient was contacted 07/20/2020 in reference to pre-operative risk assessment for pending surgery as outlined below.  Ashley Montminy was last seen on 06/03/2020 by Dr. Harrington Challenger.  Since that day, Elhadji Pecore has done well without any exertional chest pain or worsening dyspnea.  He is able to accomplish 4 METS of activity without any issue.   Therefore, based on ACC/AHA guidelines, the patient would be at acceptable risk for the planned procedure without further cardiovascular testing."  Advised to hold Xarelto 3 days prior to procedure.   Per nephrology notes in Care Everywhere creatinine stable, cleared for surgery.   VS: BP 134/60   Pulse 62   Temp 36.9 C (Oral)   Resp 16   Ht 6\' 1"  (1.854 m)   Wt 129.7 kg   SpO2 100%   BMI 37.73 kg/m   PROVIDERS: Lucianne Lei, MD is PCP   Dorris Carnes, MD is Cardiologist  LABS: Labs reviewed: Acceptable for surgery. (all labs ordered are listed, but only abnormal results are displayed)  Labs Reviewed  BASIC METABOLIC PANEL - Abnormal; Notable for the following components:      Result Value   BUN 39 (*)    Creatinine, Ser 1.87 (*)    GFR, Estimated 39 (*)    All other components within normal limits  CBC - Abnormal; Notable for the following components:   RBC 4.17 (*)     Hemoglobin 12.4 (*)    HCT 38.5 (*)    All other components within normal limits  SURGICAL PCR SCREEN     IMAGES:   EKG: 08/02/20 Rate 63 bpm  RBBB  CV: Echo 05/24/2018  1. The left ventricle has moderately reduced systolic function, with an  ejection fraction of 35-40%. The cavity size was normal. There is mildly  increased left ventricular wall thickness. Left ventricular diastolic  Doppler parameters are consistent with   impaired relaxation.   2. The right ventricle has mildly reduced systolic function. The cavity  was small. There is no increase in right ventricular wall thickness.   3. The mitral valve is normal in structure. Mild thickening of the mitral  valve leaflet.   4. The tricuspid valve is normal in structure.   5. The aortic valve is normal in structure. Aortic valve regurgitation is  mild to moderate by color flow Doppler.   6. The interatrial septum was not assessed.  Past Medical History:  Diagnosis Date   Adenocarcinoma of right lung, stage 3 ([place]) 08/23/2016   Anemia    Arthritis    "hx right hip"   Asthma    "when I was a child"   Atrial fibrillation ([place])    Amiodarone started 10/2011; Coumadin   Automatic implantable cardioverter-defibrillator in situ 10/03/2012   a. St. Jude ICD implantation 10/03/12.   Chronic anticoagulation  Chronic fatigue 08/28/7822   Chronic systolic heart failure ([place])    a. Echo 7/13: EF 25%;  b. echo 04/2012:  Mild LVH, EF 30-35%, Gr 1 DD, mild AI, mild MR, mild LAE   COPD (chronic obstructive pulmonary disease) (HCC)    Dyslipidemia    Gout    History of blood transfusion 10/15/2013   "don't know where the blood's going; HgB down to 5"   Hyperlipidemia    Hypertension    Hypothyroidism    NICM (nonischemic cardiomyopathy) ([place])    [place] 4/14:  minimal CAD   Obesity    OSA on CPAP    Tobacco abuse     Past Surgical History:  Procedure Laterality Date   CARDIAC DEFIBRILLATOR PLACEMENT  2014   CARDIOVERSION  2011    CARDIOVERSION N/A 03/15/2020   Procedure: CARDIOVERSION;  Surgeon: Fay Records, MD;  Location: [place];  Service: Cardiovascular;  Laterality: N/A;   COLONOSCOPY WITH PROPOFOL Left 10/17/2013   Procedure: COLONOSCOPY WITH PROPOFOL;  Surgeon: Inda Castle, MD;  Location: [facility] ENDOSCOPY;  Service: Endoscopy;  Laterality: Left;   ESOPHAGOGASTRODUODENOSCOPY N/A 10/17/2013   Procedure: ESOPHAGOGASTRODUODENOSCOPY (EGD);  Surgeon: Inda Castle, MD;  Location: [place];  Service: Endoscopy;  Laterality: N/A;   ESOPHAGOGASTRODUODENOSCOPY (EGD) WITH PROPOFOL N/A 06/14/2018   Procedure: ESOPHAGOGASTRODUODENOSCOPY (EGD) WITH PROPOFOL;  Surgeon: Doran Stabler, MD;  [facility];  Service: Gastroenterology;  Laterality: N/A;   GIVENS CAPSULE STUDY N/A 10/29/2013   Procedure: GIVENS CAPSULE STUDY;  Surgeon: Inda Castle, MD;  [facility];  Service: Endoscopy;  Laterality: N/A;   ICD GENERATOR CHANGEOUT N/A 02/13/2018   Procedure: ICD GENERATOR CHANGEOUT;  Surgeon: Constance Haw, MD;  Location: [place] CV LAB;  Service: Cardiovascular;  Laterality: N/A;   IMPLANTABLE CARDIOVERTER DEFIBRILLATOR IMPLANT Left 10/03/2012   Procedure: IMPLANTABLE CARDIOVERTER DEFIBRILLATOR IMPLANT;  Surgeon: Deboraha Sprang, MD;  Location: [facility] CATH LAB;  Service: Cardiovascular;  Laterality: Left;   IR FLUORO GUIDE PORT INSERTION RIGHT  09/21/2016   IR US GUIDE VASC ACCESS RIGHT  09/21/2016   JOINT REPLACEMENT     SAVORY DILATION N/A 06/14/2018   Procedure: SAVORY DILATION;  Surgeon: Doran Stabler, MD;  [facility];  Service: Gastroenterology;  Laterality: N/A;   TONSILLECTOMY  1950's   TOTAL HIP ARTHROPLASTY Right 11/25/1997   VIDEO BRONCHOSCOPY WITH ENDOBRONCHIAL ULTRASOUND N/A 08/09/2016   Procedure: VIDEO BRONCHOSCOPY WITH ENDOBRONCHIAL ULTRASOUND;  Surgeon: Javier Glazier, MD;  [facility];  Service: Thoracic;  Laterality: N/A;    MEDICATIONS:   acetaminophen (TYLENOL) 500 MG tablet   albuterol (VENTOLIN HFA) 108 (90 Base) MCG/ACT inhaler   amiodarone (PACERONE) 200 MG tablet   amLODipine (NORVASC) 5 MG tablet   atorvastatin (LIPITOR) 40 MG tablet   carvedilol (COREG) 12.5 MG tablet   CVS D3 2000 units CAPS   ferrous gluconate (FERGON) 324 MG tablet   Fluticasone-Umeclidin-Vilant 100-62.5-25 MCG/INH AEPB   furosemide (LASIX) 40 MG tablet   hydrALAZINE (APRESOLINE) 100 MG tablet   levothyroxine (SYNTHROID) 50 MCG tablet   magnesium oxide (MAG-OX) 400 (241.3 Mg) MG tablet   Polyvinyl Alcohol-Povidone (REFRESH OP)   Potassium Chloride ER 20 MEQ TBCR   sacubitril-valsartan (ENTRESTO) 97-103 MG   sildenafil (VIAGRA) 100 MG tablet   spironolactone (ALDACTONE) 25 MG tablet   ULORIC 40 MG tablet   XARELTO 20 MG TABS tablet   No current facility-administered medications for this encounter.    Janett Billow  Mayme Genta [facility] Pre-Surgical Testing [phone]

## 2020-09-16 NOTE — Progress Notes (Addendum)
Anesthesia Review:  PCP: DR Lucianne Lei  Cardiologist : DR Dorris Carnes  LOV 5/2/22[name]  Last Device check 08/02/20  07/20/20- Card clearance- Hao meng,PA  08/11/20- nephrology- DR Jani Gravel  Chest x-ray : 04/15/20- CT Chest  EKG : 08/02/20  Echo : 2020  Stress test: Cardiac Cath :  Activity level: can do a flight of stairs without difficulty  Sleep Study/ CPAP : has cpap  Fasting Blood Sugar :      / Checks Blood Sugar -- times a day:   Blood Thinner/ Instructions /Last Dose: ASA / Instructions/ Last Dose :   Xarelto- Stop 3 days prior per pt  Hx of stage 3 Lung Cancer  Has ICD  Device orders requested on 09/16/20  BM"P done 09/16/20 routed to Dr Griffin Basil.

## 2020-09-17 DIAGNOSIS — F4321 Adjustment disorder with depressed mood: Secondary | ICD-10-CM | POA: Diagnosis not present

## 2020-09-17 DIAGNOSIS — M02811 Other reactive arthropathies, right shoulder: Secondary | ICD-10-CM | POA: Diagnosis not present

## 2020-09-17 DIAGNOSIS — Z87891 Personal history of nicotine dependence: Secondary | ICD-10-CM | POA: Diagnosis not present

## 2020-09-17 DIAGNOSIS — Z6838 Body mass index (BMI) 38.0-38.9, adult: Secondary | ICD-10-CM | POA: Diagnosis not present

## 2020-09-17 DIAGNOSIS — N189 Chronic kidney disease, unspecified: Secondary | ICD-10-CM | POA: Diagnosis not present

## 2020-09-17 DIAGNOSIS — I130 Hypertensive heart and chronic kidney disease with heart failure and stage 1 through stage 4 chronic kidney disease, or unspecified chronic kidney disease: Secondary | ICD-10-CM | POA: Diagnosis not present

## 2020-09-20 ENCOUNTER — Ambulatory Visit (INDEPENDENT_AMBULATORY_CARE_PROVIDER_SITE_OTHER): Payer: Medicare Other

## 2020-09-20 ENCOUNTER — Other Ambulatory Visit (HOSPITAL_COMMUNITY)
Admission: RE | Admit: 2020-09-20 | Discharge: 2020-09-20 | Disposition: A | Discharge status: Home / Self Care | Payer: Medicare Other | Source: Ambulatory Visit | Attending: Orthopaedic Surgery | Admitting: Orthopaedic Surgery

## 2020-09-20 ENCOUNTER — Encounter: Payer: Self-pay | Admitting: Internal Medicine

## 2020-09-20 DIAGNOSIS — Z95810 Presence of automatic (implantable) cardiac defibrillator: Secondary | ICD-10-CM

## 2020-09-20 DIAGNOSIS — Z20822 Contact with and (suspected) exposure to covid-19: Secondary | ICD-10-CM | POA: Insufficient documentation

## 2020-09-20 DIAGNOSIS — Z01812 Encounter for preprocedural laboratory examination: Secondary | ICD-10-CM | POA: Insufficient documentation

## 2020-09-20 DIAGNOSIS — I5022 Chronic systolic (congestive) heart failure: Secondary | ICD-10-CM | POA: Diagnosis not present

## 2020-09-20 LAB — SARS CORONAVIRUS 2 (TAT 6-24 HRS): SARS Coronavirus 2: NEGATIVE

## 2020-09-20 NOTE — Progress Notes (Signed)
[facility] DEVICE PROGRAMMING  Patient Information: Name:  Jason Russell  DOB:  10-Feb-1951  MRN:  [id number]    Planned Procedure:  reverse shoulder arthroplasty  Surgeon:  Dr. Ophelia Charter  Date of Procedure:  09/22/20  Cautery will be used.  Position during surgery:  unknown   Please send documentation back to:  [facility] (Fax # [phone])   Device Information:  Clinic EP Physician:  Cristopher Peru, MD   Device Type:  Defibrillator Manufacturer and Phone #:  St. Jude/Abbott: [phone] Pacemaker Dependent?:  No. Date of Last Device Check:  08/02/20 Normal Device Function?:  Yes.    Electrophysiologist's Recommendations:  Have magnet available. Provide continuous ECG monitoring when magnet is used or reprogramming is to be performed.  If patient is prone, patient will need to have device reprogrammed by representative.  Procedure may interfere with device function.  Magnet should be placed over device during procedure.  Per Device Clinic Standing Orders, Simone Curia, RN  2:48 PM 09/20/2020

## 2020-09-20 NOTE — H&P (Signed)
PREOPERATIVE H&P  Chief Complaint: djd right shoulder  HPI: Jason Russell is a 70 y.o. male who is scheduled for Procedure(s): REVERSE SHOULDER ARTHROPLASTY.   Patient has a past medical history significant for HTN, OSA on CPAP, hypothyroidism, CKD Stage III, atrial fibrillation (on Xarelto), NICM with ICD in place, lung cancer.   Patient is a 70 year-old male who presents with a four year history of right shoulder pain.  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  He noted a few years ago that his range of motion declined significantly.  He did not get evaluated at that time because he was the primary care provider for his wife and so his main focus was taking care of her.  He has had continued dysfunction of his right shoulder for four years.  He is unable to raise it to use it for daily activity.  He is miserable with the shoulder.  He is right hand dominant.  His symptoms are rated as moderate to severe, and have been worsening.  This is significantly impairing activities of daily living.    Please see clinic note for further details on this patient's care.    He has elected for surgical management.   Past Medical History:  Diagnosis Date   Adenocarcinoma of right lung, stage 3 ([place]) 08/23/2016   Anemia    Arthritis    "hx right hip"   Asthma    "when I was a child"   Atrial fibrillation ([place])    Amiodarone started 10/2011; Coumadin   Automatic implantable cardioverter-defibrillator in situ 10/03/2012   a. St. Jude ICD implantation 10/03/12.   Chronic anticoagulation    Chronic fatigue 4/43/1540   Chronic systolic heart failure ([place])    a. Echo 7/13: EF 25%;  b. echo 04/2012:  Mild LVH, EF 30-35%, Gr 1 DD, mild AI, mild MR, mild LAE   COPD (chronic obstructive pulmonary disease) (HCC)    Dyslipidemia    Gout    History of blood transfusion 10/15/2013   "don't know where the blood's going; HgB down to 5"   Hyperlipidemia     Hypertension    Hypothyroidism    NICM (nonischemic cardiomyopathy) ([place])    [place] 4/14:  minimal CAD   Obesity    OSA on CPAP    Tobacco abuse    Past Surgical History:  Procedure Laterality Date   CARDIAC DEFIBRILLATOR PLACEMENT  2014   CARDIOVERSION  2011   CARDIOVERSION N/A 03/15/2020   Procedure: CARDIOVERSION;  Surgeon: Fay Records, MD;  Location: [place];  Service: Cardiovascular;  Laterality: N/A;   COLONOSCOPY WITH PROPOFOL Left 10/17/2013   Procedure: COLONOSCOPY WITH PROPOFOL;  Surgeon: Inda Castle, MD;  Location: [facility] ENDOSCOPY;  Service: Endoscopy;  Laterality: Left;   ESOPHAGOGASTRODUODENOSCOPY N/A 10/17/2013   Procedure: ESOPHAGOGASTRODUODENOSCOPY (EGD);  Surgeon: Inda Castle, MD;  Location: [place];  Service: Endoscopy;  Laterality: N/A;   ESOPHAGOGASTRODUODENOSCOPY (EGD) WITH PROPOFOL N/A 06/14/2018   Procedure: ESOPHAGOGASTRODUODENOSCOPY (EGD) WITH PROPOFOL;  Surgeon: Doran Stabler, MD;  [facility];  Service: Gastroenterology;  Laterality: N/A;   GIVENS CAPSULE STUDY N/A 10/29/2013   Procedure: GIVENS CAPSULE STUDY;  Surgeon: Inda Castle, MD;  [facility];  Service: Endoscopy;  Laterality: N/A;   ICD GENERATOR CHANGEOUT N/A 02/13/2018   Procedure: ICD GENERATOR CHANGEOUT;  Surgeon: Constance Haw, MD;  Location: [place] CV LAB;  Service: Cardiovascular;  Laterality: N/A;   IMPLANTABLE CARDIOVERTER DEFIBRILLATOR IMPLANT Left 10/03/2012   Procedure: IMPLANTABLE CARDIOVERTER DEFIBRILLATOR IMPLANT;  Surgeon: Deboraha Sprang, MD;  Location: [facility] CATH LAB;  Service: Cardiovascular;  Laterality: Left;   IR FLUORO GUIDE PORT INSERTION RIGHT  09/21/2016   IR US GUIDE VASC ACCESS RIGHT  09/21/2016   JOINT REPLACEMENT     SAVORY DILATION N/A 06/14/2018   Procedure: SAVORY DILATION;  Surgeon: Doran Stabler, MD;  [facility];  Service: Gastroenterology;  Laterality: N/A;   TONSILLECTOMY  1950's   TOTAL HIP ARTHROPLASTY  Right 11/25/1997   VIDEO BRONCHOSCOPY WITH ENDOBRONCHIAL ULTRASOUND N/A 08/09/2016   Procedure: VIDEO BRONCHOSCOPY WITH ENDOBRONCHIAL ULTRASOUND;  Surgeon: Javier Glazier, MD;  [facility] OR;  Service: Thoracic;  Laterality: N/A;   Social History   Socioeconomic History   Marital status: Widowed    Spouse name: Not on file   Number of children: 1   Years of education: Not on file   Highest education level: Not on file  Occupational History   Occupation: retired    Fish farm manager: [employer]  Tobacco Use   Smoking status: Former    Packs/day: 0.25    Years: 45.00    Pack years: 11.25    Types: Cigarettes    Quit date: 07/11/2016    Years since quitting: 4.1   Smokeless tobacco: Never  Vaping Use   Vaping Use: Never used  Substance and Sexual Activity   Alcohol use: Yes    Alcohol/week: 1.0 standard drink    Types: 1 Cans of beer per week    Comment: seldom   Drug use: Never   Sexual activity: Not Currently  Other Topics Concern   Not on file  Social History Narrative   Not on file   Social Determinants of Health   Financial Resource Strain: Not on file  Food Insecurity: Not on file  Transportation Needs: Not on file  Physical Activity: Not on file  Stress: Not on file  Social Connections: Not on file   Family History  Problem Relation Age of Onset   Hypertension Mother    Heart Problems Mother    Other Father        deceased- unknow reason   Lung cancer Brother    No Known Allergies Prior to Admission medications   Medication Sig Start Date End Date Taking? Authorizing Provider  acetaminophen (TYLENOL) 500 MG tablet Take 1,000 mg by mouth every 6 (six) hours as needed for mild pain.   Yes [provider]  amiodarone (PACERONE) 200 MG tablet Take 1 tablet (200 mg total) by mouth daily. Except Saturday and Sunday Patient taking differently: Take 200 mg by mouth See admin instructions. Take daily Except Saturday and Sunday 08/02/20  Yes Baldwin Jamaica, PA-C  amLODipine (NORVASC) 5 MG tablet Take 5 mg by mouth daily. 05/02/12  Yes [provider]  atorvastatin (LIPITOR) 40 MG tablet Take 1 tablet (40 mg total) by mouth daily. 12/10/19  Yes Fay Records, MD  carvedilol (COREG) 12.5 MG tablet TAKE 1 TABLET BY MOUTH TWICE A DAY Patient taking differently: Take 12.5 mg by mouth 2 (two) times daily with a meal. 04/29/20  Yes Fay Records, MD  CVS D3 2000 units CAPS Take 2,000 Units by mouth daily.  11/02/16  Yes [provider]  ferrous gluconate (FERGON) 324 MG tablet Take 1 tablet (324 mg total) by mouth 3 (three) times daily with meals. 03/24/20  Yes Deboraha Sprang, MD  Fluticasone-Umeclidin-Vilant 100-62.5-25 MCG/INH AEPB TAKE 1 PUFF BY MOUTH EVERY DAY Patient taking differently: Inhale 1 puff into the lungs daily. TAKE 1 PUFF BY MOUTH EVERY DAY 12/01/19  Yes Parrett, Tammy S, NP  furosemide (LASIX) 40 MG tablet Take 20 mg by mouth daily. Patient takes 1/2 half tablet by mouth (20 mg Total) once daily   Yes [provider]  hydrALAZINE (APRESOLINE) 100 MG tablet TAKE 1 TABLET BY MOUTH THREE TIMES A DAY Patient taking differently: Take 100 mg by mouth 3 (three) times daily. TAKE 1 TABLET BY MOUTH THREE TIMES A DAY 03/05/20  Yes Fay Records, MD  levothyroxine (SYNTHROID) 50 MCG tablet TAKE 1 TABLET BY MOUTH DAILY BEFORE BREAKFAST Patient taking differently: Take 50 mcg by mouth daily before breakfast. 04/05/20  Yes Fay Records, MD  magnesium oxide (MAG-OX) 400 (241.3 Mg) MG tablet Take 1 tablet (400 mg total) by mouth daily. 01/10/17  Yes Vann, Tomi Bamberger, DO  Polyvinyl Alcohol-Povidone (REFRESH OP) Place 1 drop into both eyes every morning.   Yes [provider]  Potassium Chloride ER 20 MEQ TBCR TAKE 1 TABLET BY MOUTH EVERY DAY Patient taking differently: Take 20 mEq by mouth daily. 05/05/20  Yes Fay Records, MD  sacubitril-valsartan (ENTRESTO) 97-103 MG Take 1 tablet by mouth 2 (two) times daily. 06/09/20  Yes  Fay Records, MD  sildenafil (VIAGRA) 100 MG tablet Take 1 tablet (100 mg total) by mouth daily as needed for erectile dysfunction. 12/01/19  Yes Fay Records, MD  spironolactone (ALDACTONE) 25 MG tablet Take 1 tablet (25 mg total) by mouth daily. 07/29/20  Yes Deboraha Sprang, MD  ULORIC 40 MG tablet Take 1 tablet (40 mg total) by mouth daily. 10/17/16  Yes Deboraha Sprang, MD  XARELTO 20 MG TABS tablet TAKE 1 TABLET BY MOUTH EVERY DAY Patient taking differently: Take 20 mg by mouth daily with supper. 04/29/20  Yes Fay Records, MD  albuterol (VENTOLIN HFA) 108 (90 Base) MCG/ACT inhaler INHALE 2 PUFFS INTO THE LUNGS EVERY 4 HOURS AS NEEDED FOR WHEEZING OR SHORTNESS OF BREATH. 09/14/20   Parrett, Fonnie Mu, NP    ROS: All other systems have been reviewed and were otherwise negative with the exception of those mentioned in the HPI and as above.  Physical Exam: General: Alert, no acute distress Cardiovascular: No pedal edema Respiratory: No cyanosis, no use of accessory musculature GI: No organomegaly, abdomen is soft and non-tender Skin: No lesions in the area of chief complaint Neurologic: Sensation intact distally Psychiatric: Patient is competent for consent with normal mood and affect Lymphatic: No axillary or cervical lymphadenopathy  MUSCULOSKELETAL:  Right shoulder: Active forward elevation about 20, passive to about 140.  He reports sensation in the axillary nerve distribution that is symmetric.  However, it is difficult to really get his axillary nerve to fire consistently. However, did not appear to be as robust as the contralateral side.    Imaging: CT reviewed and discussed with Dr. Posey Pronto from radiology demonstrating a full thickness cuff tear with retraction back to the glenoid.    Assessment: djd right shoulder  Plan: Plan for Procedure(s): REVERSE SHOULDER ARTHROPLASTY  The risks benefits and alternatives were discussed with the patient including but not limited to the  risks of nonoperative treatment, versus surgical intervention including infection, bleeding, nerve injury,  blood clots, cardiopulmonary complications, morbidity, mortality, among others, and they were willing to proceed.   We  additionally specifically discussed risks of axillary nerve injury, infection, periprosthetic fracture, continued pain and longevity of implants prior to beginning procedure.   Additionally, his axillary nerve function is questionable currently and he understands that may not be improving.  He has no history of neck pathology.  We think it is reasonable to continue with surgery. He understands that he may have issues with dislocation and function secondary to his axillary nerve function.     Patient will be closely monitored in PACU for medical stabilization and pain control. If found stable in PACU, patient may be discharged home with outpatient follow-up. If any concerns regarding patient's stabilization patient will be admitted for observation after surgery. The patient is planning to be discharged home with outpatient PT.   The patient acknowledged the explanation, agreed to proceed with the plan and consent was signed.  He received operative clearance from: - nephrologist, Dr. Elizabeth Sauer. Per recommendations, avoid NSAIDs, baseline creatinine about 1.5. - PCP, Dr. Lucianne Lei - oncologist, Dr. Julien Nordmann - cardiologist, Dr. Harrington Challenger. Per recommendations, hold Xarelto 3 days prior to procedure  Operative Plan: Right reverse total shoulder arthroplasty Discharge Medications: Tylenol, Gabapentin, Oxycodone, Zofran DVT Prophylaxis: Resume Xarelto Physical Therapy: Outpatient PT Special Discharge needs: Sling. Story, PA-C  09/20/2020 3:59 PM

## 2020-09-21 NOTE — Progress Notes (Signed)
EPIC Encounter for ICM Monitoring  Patient Name: Jason Russell is a 70 y.o. male Date: 09/21/2020 Primary Care Physican: Lucianne Lei, MD Primary Cardiologist: Harrington Challenger Electrophysiologist: Caryl Comes 09/21/2020 Weight: 283 lbs   Spoke with patient and reports feeling well at this time. Heart failure questions reviewed. Pt asymptomatic.     Patient reported his wife passed away 14-Apr-2020.      [facility] Thoracic impedance suggesting normal fluid levels   Prescribed:  Furosemide 40 mg take 0.5 tablet (20 mg total) by mouth daily  Potassium 20 mEq 1 tablet daily.   Labs: 09/16/2020 Creatinine 1.87, BUN 39, Potassium 3.7, Sodium 138, GFR 39 06/21/2020 Creatinine 1.98, BUN 43, Potassium 4.1, Sodium 139, GFR 36 04/22/2020 Creatinine 2.12, BUN 44, Potassium 4.4, Sodium 142, GFR 33 A complete set of results can be found in results review   Recommendations:   No changes and encouraged to call if experiencing any fluid symptoms.   Follow-up plan: ICM clinic phone appointment on 09/29/2020 to recheck fluid levels after surgery.   91 day device clinic remote transmission 10/18/2020.     EP/Cardiology Office Visits:    Last OV with Dr Harrington Challenger was 06/03/2020 (no recall for 2023 appointment).   Copy of ICM check sent to Dr. Caryl Comes.   3 month ICM trend: 09/20/2020.    1 Year ICM trend:       [name], RN 09/21/2020 8:00 AM

## 2020-09-22 ENCOUNTER — Observation Stay (HOSPITAL_COMMUNITY)
Admission: RE | Admit: 2020-09-22 | Discharge: 2020-09-23 | Disposition: A | Discharge status: Home / Self Care | Payer: Medicare Other | Source: Ambulatory Visit | Attending: Orthopaedic Surgery | Admitting: Orthopaedic Surgery

## 2020-09-22 ENCOUNTER — Encounter (HOSPITAL_COMMUNITY)
Admission: RE | Disposition: A | Discharge status: Home / Self Care | Payer: Self-pay | Source: Ambulatory Visit | Attending: Orthopaedic Surgery

## 2020-09-22 ENCOUNTER — Ambulatory Visit (HOSPITAL_COMMUNITY): Payer: Medicare Other | Admitting: Anesthesiology

## 2020-09-22 ENCOUNTER — Other Ambulatory Visit: Payer: Self-pay

## 2020-09-22 ENCOUNTER — Encounter (HOSPITAL_COMMUNITY): Payer: Self-pay | Admitting: Orthopaedic Surgery

## 2020-09-22 ENCOUNTER — Observation Stay (HOSPITAL_COMMUNITY): Payer: Medicare Other

## 2020-09-22 ENCOUNTER — Ambulatory Visit (HOSPITAL_COMMUNITY): Payer: Medicare Other | Admitting: Physician Assistant

## 2020-09-22 DIAGNOSIS — Z96641 Presence of right artificial hip joint: Secondary | ICD-10-CM | POA: Diagnosis not present

## 2020-09-22 DIAGNOSIS — I130 Hypertensive heart and chronic kidney disease with heart failure and stage 1 through stage 4 chronic kidney disease, or unspecified chronic kidney disease: Secondary | ICD-10-CM | POA: Diagnosis not present

## 2020-09-22 DIAGNOSIS — Z95810 Presence of automatic (implantable) cardiac defibrillator: Secondary | ICD-10-CM | POA: Diagnosis not present

## 2020-09-22 DIAGNOSIS — J449 Chronic obstructive pulmonary disease, unspecified: Secondary | ICD-10-CM | POA: Diagnosis not present

## 2020-09-22 DIAGNOSIS — Z87891 Personal history of nicotine dependence: Secondary | ICD-10-CM | POA: Insufficient documentation

## 2020-09-22 DIAGNOSIS — M75101 Unspecified rotator cuff tear or rupture of right shoulder, not specified as traumatic: Secondary | ICD-10-CM | POA: Diagnosis not present

## 2020-09-22 DIAGNOSIS — I4891 Unspecified atrial fibrillation: Secondary | ICD-10-CM | POA: Insufficient documentation

## 2020-09-22 DIAGNOSIS — M19011 Primary osteoarthritis, right shoulder: Secondary | ICD-10-CM | POA: Diagnosis not present

## 2020-09-22 DIAGNOSIS — Z7901 Long term (current) use of anticoagulants: Secondary | ICD-10-CM | POA: Insufficient documentation

## 2020-09-22 DIAGNOSIS — Z79899 Other long term (current) drug therapy: Secondary | ICD-10-CM | POA: Diagnosis not present

## 2020-09-22 DIAGNOSIS — G8918 Other acute postprocedural pain: Secondary | ICD-10-CM | POA: Diagnosis not present

## 2020-09-22 DIAGNOSIS — Z471 Aftercare following joint replacement surgery: Secondary | ICD-10-CM | POA: Diagnosis not present

## 2020-09-22 DIAGNOSIS — J45909 Unspecified asthma, uncomplicated: Secondary | ICD-10-CM | POA: Insufficient documentation

## 2020-09-22 DIAGNOSIS — Z85118 Personal history of other malignant neoplasm of bronchus and lung: Secondary | ICD-10-CM | POA: Insufficient documentation

## 2020-09-22 DIAGNOSIS — Z09 Encounter for follow-up examination after completed treatment for conditions other than malignant neoplasm: Secondary | ICD-10-CM

## 2020-09-22 DIAGNOSIS — Z96611 Presence of right artificial shoulder joint: Secondary | ICD-10-CM | POA: Diagnosis not present

## 2020-09-22 DIAGNOSIS — I5022 Chronic systolic (congestive) heart failure: Secondary | ICD-10-CM | POA: Insufficient documentation

## 2020-09-22 DIAGNOSIS — Z9989 Dependence on other enabling machines and devices: Secondary | ICD-10-CM | POA: Diagnosis not present

## 2020-09-22 DIAGNOSIS — G4733 Obstructive sleep apnea (adult) (pediatric): Secondary | ICD-10-CM | POA: Diagnosis not present

## 2020-09-22 DIAGNOSIS — E039 Hypothyroidism, unspecified: Secondary | ICD-10-CM | POA: Diagnosis not present

## 2020-09-22 DIAGNOSIS — N1832 Chronic kidney disease, stage 3b: Secondary | ICD-10-CM | POA: Diagnosis not present

## 2020-09-22 HISTORY — PX: REVERSE SHOULDER ARTHROPLASTY: SHX5054

## 2020-09-22 LAB — GLUCOSE, CAPILLARY: Glucose-Capillary: 106 mg/dL — ABNORMAL HIGH (ref 70–99)

## 2020-09-22 SURGERY — ARTHROPLASTY, SHOULDER, TOTAL, REVERSE
Anesthesia: General | Site: Shoulder | Laterality: Right

## 2020-09-22 MED ORDER — PHENYLEPHRINE 40 MCG/ML (10ML) SYRINGE FOR IV PUSH (FOR BLOOD PRESSURE SUPPORT)
PREFILLED_SYRINGE | INTRAVENOUS | Status: AC
  Filled 2020-09-22: qty 30

## 2020-09-22 MED ORDER — LACTATED RINGERS IV SOLN: INTRAVENOUS | Status: DC

## 2020-09-22 MED ORDER — FENTANYL CITRATE (PF) 100 MCG/2ML IJ SOLN: 50.0000 ug | INTRAMUSCULAR | Status: DC

## 2020-09-22 MED ORDER — VANCOMYCIN HCL 1000 MG IV SOLR
INTRAVENOUS | Status: AC
  Filled 2020-09-22: qty 1000

## 2020-09-22 MED ORDER — POTASSIUM CHLORIDE CRYS ER 20 MEQ PO TBCR
20.0000 meq | EXTENDED_RELEASE_TABLET | Freq: Every day | ORAL | Status: DC
  Administered 2020-09-23: 20 meq via ORAL
  Filled 2020-09-22: qty 1

## 2020-09-22 MED ORDER — UMECLIDINIUM BROMIDE 62.5 MCG/INH IN AEPB
1.0000 | INHALATION_SPRAY | Freq: Every day | RESPIRATORY_TRACT | Status: DC
  Administered 2020-09-23: 1 via RESPIRATORY_TRACT
  Filled 2020-09-22: qty 7

## 2020-09-22 MED ORDER — ACETAMINOPHEN 500 MG PO TABS
ORAL_TABLET | ORAL | Status: AC
  Filled 2020-09-22: qty 2

## 2020-09-22 MED ORDER — PROPOFOL 10 MG/ML IV BOLUS
INTRAVENOUS | Status: AC
  Filled 2020-09-22: qty 20

## 2020-09-22 MED ORDER — OXYCODONE HCL 5 MG PO TABS
10.0000 mg | ORAL_TABLET | ORAL | Status: DC | PRN
  Administered 2020-09-22 – 2020-09-23 (×2): 10 mg via ORAL
  Filled 2020-09-22 (×2): qty 2

## 2020-09-22 MED ORDER — OXYCODONE HCL 5 MG/5ML PO SOLN: 5.0000 mg | Freq: Once | ORAL | Status: DC | PRN

## 2020-09-22 MED ORDER — OXYCODONE HCL 5 MG PO TABS
5.0000 mg | ORAL_TABLET | ORAL | Status: DC | PRN
  Administered 2020-09-22 – 2020-09-23 (×2): 5 mg via ORAL
  Filled 2020-09-22 (×2): qty 1

## 2020-09-22 MED ORDER — CARVEDILOL 12.5 MG PO TABS
12.5000 mg | ORAL_TABLET | Freq: Two times a day (BID) | ORAL | Status: DC
  Administered 2020-09-22 – 2020-09-23 (×2): 12.5 mg via ORAL
  Filled 2020-09-22 (×2): qty 1

## 2020-09-22 MED ORDER — 0.9 % SODIUM CHLORIDE (POUR BTL) OPTIME
TOPICAL | Status: DC | PRN
  Administered 2020-09-22: 1000 mL

## 2020-09-22 MED ORDER — BUPIVACAINE LIPOSOME 1.3 % IJ SUSP
INTRAMUSCULAR | Status: DC | PRN
  Administered 2020-09-22: 10 mL via PERINEURAL

## 2020-09-22 MED ORDER — DIPHENHYDRAMINE HCL 12.5 MG/5ML PO ELIX: 12.5000 mg | ORAL_SOLUTION | ORAL | Status: DC | PRN

## 2020-09-22 MED ORDER — SUGAMMADEX SODIUM 500 MG/5ML IV SOLN
INTRAVENOUS | Status: AC
  Filled 2020-09-22: qty 5

## 2020-09-22 MED ORDER — TRANEXAMIC ACID-NACL 1000-0.7 MG/100ML-% IV SOLN
1000.0000 mg | INTRAVENOUS | Status: AC
  Administered 2020-09-22: 1000 mg via INTRAVENOUS

## 2020-09-22 MED ORDER — MIDAZOLAM HCL 2 MG/2ML IJ SOLN: 1.0000 mg | INTRAMUSCULAR | Status: DC

## 2020-09-22 MED ORDER — FLUTICASONE FUROATE-VILANTEROL 100-25 MCG/INH IN AEPB
1.0000 | INHALATION_SPRAY | Freq: Every day | RESPIRATORY_TRACT | Status: DC
  Administered 2020-09-23: 1 via RESPIRATORY_TRACT
  Filled 2020-09-22: qty 28

## 2020-09-22 MED ORDER — FENTANYL CITRATE (PF) 100 MCG/2ML IJ SOLN
INTRAMUSCULAR | Status: AC
  Filled 2020-09-22: qty 2

## 2020-09-22 MED ORDER — METHOCARBAMOL 500 MG PO TABS
500.0000 mg | ORAL_TABLET | Freq: Four times a day (QID) | ORAL | Status: DC | PRN
  Administered 2020-09-22 – 2020-09-23 (×2): 500 mg via ORAL
  Filled 2020-09-22 (×2): qty 1

## 2020-09-22 MED ORDER — CEFAZOLIN IN SODIUM CHLORIDE 3-0.9 GM/100ML-% IV SOLN
3.0000 g | INTRAVENOUS | Status: AC
Start: 2020-09-22 — End: 2020-09-22
  Administered 2020-09-22: 3 g via INTRAVENOUS

## 2020-09-22 MED ORDER — POLYETHYLENE GLYCOL 3350 17 G PO PACK: 17.0000 g | PACK | Freq: Every day | ORAL | Status: DC | PRN

## 2020-09-22 MED ORDER — DEXAMETHASONE SODIUM PHOSPHATE 10 MG/ML IJ SOLN
INTRAMUSCULAR | Status: AC
  Filled 2020-09-22: qty 2

## 2020-09-22 MED ORDER — FLUTICASONE-UMECLIDIN-VILANT 100-62.5-25 MCG/INH IN AEPB: 1.0000 | INHALATION_SPRAY | Freq: Every day | RESPIRATORY_TRACT | Status: DC

## 2020-09-22 MED ORDER — PHENYLEPHRINE HCL-NACL 10-0.9 MG/250ML-% IV SOLN
INTRAVENOUS | Status: DC | PRN
  Administered 2020-09-22: 20 ug/min via INTRAVENOUS

## 2020-09-22 MED ORDER — ORAL CARE MOUTH RINSE: 15.0000 mL | Freq: Once | OROMUCOSAL | Status: AC

## 2020-09-22 MED ORDER — SODIUM CHLORIDE 0.9 % IR SOLN
Status: DC | PRN
  Administered 2020-09-22: 1000 mL

## 2020-09-22 MED ORDER — ONDANSETRON HCL 4 MG/2ML IJ SOLN: 4.0000 mg | Freq: Once | INTRAMUSCULAR | Status: DC | PRN

## 2020-09-22 MED ORDER — DOCUSATE SODIUM 100 MG PO CAPS
100.0000 mg | ORAL_CAPSULE | Freq: Two times a day (BID) | ORAL | Status: DC
  Administered 2020-09-22 – 2020-09-23 (×2): 100 mg via ORAL
  Filled 2020-09-22 (×2): qty 1

## 2020-09-22 MED ORDER — CEFAZOLIN SODIUM-DEXTROSE 2-4 GM/100ML-% IV SOLN
2.0000 g | Freq: Four times a day (QID) | INTRAVENOUS | Status: AC
  Administered 2020-09-22 – 2020-09-23 (×3): 2 g via INTRAVENOUS
  Filled 2020-09-22 (×3): qty 100

## 2020-09-22 MED ORDER — ROCURONIUM BROMIDE 10 MG/ML (PF) SYRINGE
PREFILLED_SYRINGE | INTRAVENOUS | Status: AC
  Filled 2020-09-22: qty 10

## 2020-09-22 MED ORDER — AMIODARONE HCL 200 MG PO TABS
200.0000 mg | ORAL_TABLET | ORAL | Status: DC
  Administered 2020-09-23: 200 mg via ORAL
  Filled 2020-09-22: qty 1

## 2020-09-22 MED ORDER — LIDOCAINE 2% (20 MG/ML) 5 ML SYRINGE
INTRAMUSCULAR | Status: AC
  Filled 2020-09-22: qty 5

## 2020-09-22 MED ORDER — ALBUTEROL SULFATE HFA 108 (90 BASE) MCG/ACT IN AERS: 2.0000 | INHALATION_SPRAY | RESPIRATORY_TRACT | Status: DC | PRN

## 2020-09-22 MED ORDER — HYDROMORPHONE HCL 1 MG/ML IJ SOLN: 0.5000 mg | INTRAMUSCULAR | Status: DC | PRN

## 2020-09-22 MED ORDER — RIVAROXABAN 10 MG PO TABS
20.0000 mg | ORAL_TABLET | Freq: Every day | ORAL | Status: DC
  Administered 2020-09-23: 20 mg via ORAL
  Filled 2020-09-22: qty 2

## 2020-09-22 MED ORDER — MIDAZOLAM HCL 2 MG/2ML IJ SOLN
INTRAMUSCULAR | Status: AC
  Administered 2020-09-22: 1 mg
  Filled 2020-09-22: qty 2

## 2020-09-22 MED ORDER — BUPIVACAINE HCL (PF) 0.5 % IJ SOLN
INTRAMUSCULAR | Status: DC | PRN
  Administered 2020-09-22: 15 mL via PERINEURAL

## 2020-09-22 MED ORDER — ONDANSETRON HCL 4 MG/2ML IJ SOLN
INTRAMUSCULAR | Status: DC | PRN
  Administered 2020-09-22: 4 mg via INTRAVENOUS

## 2020-09-22 MED ORDER — CEFAZOLIN IN SODIUM CHLORIDE 3-0.9 GM/100ML-% IV SOLN
INTRAVENOUS | Status: AC
  Filled 2020-09-22: qty 100

## 2020-09-22 MED ORDER — ROCURONIUM BROMIDE 10 MG/ML (PF) SYRINGE
PREFILLED_SYRINGE | INTRAVENOUS | Status: DC | PRN
  Administered 2020-09-22: 60 mg via INTRAVENOUS

## 2020-09-22 MED ORDER — ONDANSETRON HCL 4 MG/2ML IJ SOLN: 4.0000 mg | Freq: Four times a day (QID) | INTRAMUSCULAR | Status: DC | PRN

## 2020-09-22 MED ORDER — FENTANYL CITRATE (PF) 100 MCG/2ML IJ SOLN
INTRAMUSCULAR | Status: AC
  Administered 2020-09-22: 50 ug
  Filled 2020-09-22: qty 2

## 2020-09-22 MED ORDER — ACETAMINOPHEN 500 MG PO TABS
1000.0000 mg | ORAL_TABLET | Freq: Three times a day (TID) | ORAL | Status: DC
  Administered 2020-09-22 – 2020-09-23 (×3): 1000 mg via ORAL
  Filled 2020-09-22 (×3): qty 2

## 2020-09-22 MED ORDER — VANCOMYCIN HCL 1 G IV SOLR
INTRAVENOUS | Status: DC | PRN
  Administered 2020-09-22: 1000 mg via TOPICAL

## 2020-09-22 MED ORDER — STERILE WATER FOR IRRIGATION IR SOLN
Status: DC | PRN
  Administered 2020-09-22: 2000 mL

## 2020-09-22 MED ORDER — FENTANYL CITRATE (PF) 100 MCG/2ML IJ SOLN
25.0000 ug | INTRAMUSCULAR | Status: DC | PRN
  Administered 2020-09-22 (×2): 50 ug via INTRAVENOUS

## 2020-09-22 MED ORDER — TRANEXAMIC ACID-NACL 1000-0.7 MG/100ML-% IV SOLN
INTRAVENOUS | Status: AC
  Filled 2020-09-22: qty 100

## 2020-09-22 MED ORDER — ONDANSETRON HCL 4 MG/2ML IJ SOLN
INTRAMUSCULAR | Status: AC
  Filled 2020-09-22: qty 4

## 2020-09-22 MED ORDER — SPIRONOLACTONE 25 MG PO TABS
25.0000 mg | ORAL_TABLET | Freq: Every day | ORAL | Status: DC
  Administered 2020-09-23: 25 mg via ORAL
  Filled 2020-09-22: qty 1

## 2020-09-22 MED ORDER — PHENYLEPHRINE 40 MCG/ML (10ML) SYRINGE FOR IV PUSH (FOR BLOOD PRESSURE SUPPORT)
PREFILLED_SYRINGE | INTRAVENOUS | Status: DC | PRN
  Administered 2020-09-22: 80 ug via INTRAVENOUS

## 2020-09-22 MED ORDER — SUGAMMADEX SODIUM 500 MG/5ML IV SOLN
INTRAVENOUS | Status: DC | PRN
  Administered 2020-09-22: 300 mg via INTRAVENOUS

## 2020-09-22 MED ORDER — DEXAMETHASONE SODIUM PHOSPHATE 10 MG/ML IJ SOLN
INTRAMUSCULAR | Status: DC | PRN
  Administered 2020-09-22: 10 mg via INTRAVENOUS

## 2020-09-22 MED ORDER — LEVOTHYROXINE SODIUM 50 MCG PO TABS
50.0000 ug | ORAL_TABLET | Freq: Every day | ORAL | Status: DC
  Administered 2020-09-23: 50 ug via ORAL
  Filled 2020-09-22: qty 1

## 2020-09-22 MED ORDER — MIDAZOLAM HCL 2 MG/2ML IJ SOLN
INTRAMUSCULAR | Status: AC
  Filled 2020-09-22: qty 2

## 2020-09-22 MED ORDER — ALBUTEROL SULFATE (2.5 MG/3ML) 0.083% IN NEBU: 2.5000 mg | INHALATION_SOLUTION | RESPIRATORY_TRACT | Status: DC | PRN

## 2020-09-22 MED ORDER — EPHEDRINE 5 MG/ML INJ
INTRAVENOUS | Status: AC
  Filled 2020-09-22: qty 10

## 2020-09-22 MED ORDER — METHOCARBAMOL 500 MG IVPB - SIMPLE MED
500.0000 mg | Freq: Four times a day (QID) | INTRAVENOUS | Status: DC | PRN
  Filled 2020-09-22: qty 50

## 2020-09-22 MED ORDER — OXYMETAZOLINE HCL 0.05 % NA SOLN
NASAL | Status: AC
  Filled 2020-09-22: qty 30

## 2020-09-22 MED ORDER — ZOLPIDEM TARTRATE 5 MG PO TABS: 5.0000 mg | ORAL_TABLET | Freq: Every evening | ORAL | Status: DC | PRN

## 2020-09-22 MED ORDER — EPHEDRINE SULFATE-NACL 50-0.9 MG/10ML-% IV SOSY
PREFILLED_SYRINGE | INTRAVENOUS | Status: DC | PRN
  Administered 2020-09-22 (×3): 10 mg via INTRAVENOUS
  Administered 2020-09-22: 5 mg via INTRAVENOUS
  Administered 2020-09-22: 10 mg via INTRAVENOUS
  Administered 2020-09-22: 5 mg via INTRAVENOUS

## 2020-09-22 MED ORDER — PROPOFOL 10 MG/ML IV BOLUS
INTRAVENOUS | Status: DC | PRN
  Administered 2020-09-22: 100 mg via INTRAVENOUS

## 2020-09-22 MED ORDER — MENTHOL 3 MG MT LOZG: 1.0000 | LOZENGE | OROMUCOSAL | Status: DC | PRN

## 2020-09-22 MED ORDER — PHENOL 1.4 % MT LIQD: 1.0000 | OROMUCOSAL | Status: DC | PRN

## 2020-09-22 MED ORDER — ONDANSETRON HCL 4 MG/2ML IJ SOLN
INTRAMUSCULAR | Status: AC
  Filled 2020-09-22: qty 2

## 2020-09-22 MED ORDER — FUROSEMIDE 20 MG PO TABS
20.0000 mg | ORAL_TABLET | Freq: Every day | ORAL | Status: DC
  Administered 2020-09-23: 20 mg via ORAL
  Filled 2020-09-22: qty 1

## 2020-09-22 MED ORDER — ACETAMINOPHEN 500 MG PO TABS
1000.0000 mg | ORAL_TABLET | Freq: Once | ORAL | Status: AC
  Administered 2020-09-22: 1000 mg via ORAL

## 2020-09-22 MED ORDER — LIDOCAINE 2% (20 MG/ML) 5 ML SYRINGE
INTRAMUSCULAR | Status: DC | PRN
  Administered 2020-09-22: 60 mg via INTRAVENOUS

## 2020-09-22 MED ORDER — BISACODYL 10 MG RE SUPP: 10.0000 mg | Freq: Every day | RECTAL | Status: DC | PRN

## 2020-09-22 MED ORDER — SACUBITRIL-VALSARTAN 97-103 MG PO TABS
1.0000 | ORAL_TABLET | Freq: Two times a day (BID) | ORAL | Status: DC
  Administered 2020-09-23: 1 via ORAL
  Filled 2020-09-22: qty 1

## 2020-09-22 MED ORDER — OXYCODONE HCL 5 MG PO TABS
5.0000 mg | ORAL_TABLET | Freq: Once | ORAL | Status: DC | PRN
Start: 2020-09-22 — End: 2020-09-22

## 2020-09-22 MED ORDER — FENTANYL CITRATE (PF) 100 MCG/2ML IJ SOLN
INTRAMUSCULAR | Status: DC | PRN
  Administered 2020-09-22 (×2): 25 ug via INTRAVENOUS
  Administered 2020-09-22: 50 ug via INTRAVENOUS

## 2020-09-22 MED ORDER — CHLORHEXIDINE GLUCONATE 0.12 % MT SOLN
15.0000 mL | Freq: Once | OROMUCOSAL | Status: AC
  Administered 2020-09-22: 15 mL via OROMUCOSAL

## 2020-09-22 MED ORDER — HYDRALAZINE HCL 50 MG PO TABS
100.0000 mg | ORAL_TABLET | Freq: Three times a day (TID) | ORAL | Status: DC
  Administered 2020-09-22 – 2020-09-23 (×3): 100 mg via ORAL
  Filled 2020-09-22 (×3): qty 2

## 2020-09-22 MED ORDER — DEXAMETHASONE SODIUM PHOSPHATE 10 MG/ML IJ SOLN
INTRAMUSCULAR | Status: AC
  Filled 2020-09-22: qty 1

## 2020-09-22 MED ORDER — ONDANSETRON HCL 4 MG PO TABS: 4.0000 mg | ORAL_TABLET | Freq: Four times a day (QID) | ORAL | Status: DC | PRN

## 2020-09-22 SURGICAL SUPPLY — 59 items
BIT DRILL 3.2 PERIPHERAL SCREW (BIT) ×2 IMPLANT
BLADE SAW SAG 73X25 THK (BLADE) ×1
BLADE SAW SGTL 73X25 THK (BLADE) ×1 IMPLANT
CHLORAPREP W/TINT 26 (MISCELLANEOUS) ×4 IMPLANT
CLSR STERI-STRIP ANTIMIC 1/2X4 (GAUZE/BANDAGES/DRESSINGS) ×2 IMPLANT
COOLER ICEMAN CLASSIC (MISCELLANEOUS) IMPLANT
COVER BACK TABLE 60X90IN (DRAPES) IMPLANT
COVER SURGICAL LIGHT HANDLE (MISCELLANEOUS) ×2 IMPLANT
COVER WAND RF STERILE (DRAPES) ×2 IMPLANT
DRAPE INCISE IOBAN 66X45 STRL (DRAPES) ×2 IMPLANT
DRAPE ORTHO SPLIT 77X108 STRL (DRAPES) ×4
DRAPE SHEET LG 3/4 BI-LAMINATE (DRAPES) ×4 IMPLANT
DRAPE SURG ORHT 6 SPLT 77X108 (DRAPES) ×2 IMPLANT
DRSG AQUACEL AG ADV 3.5X 6 (GAUZE/BANDAGES/DRESSINGS) ×2 IMPLANT
ELECT BLADE TIP CTD 4 INCH (ELECTRODE) ×2 IMPLANT
ELECT REM PT RETURN 15FT ADLT (MISCELLANEOUS) ×2 IMPLANT
FACESHIELD WRAPAROUND (MASK) ×2 IMPLANT
GLOVE SRG 8 PF TXTR STRL LF DI (GLOVE) ×1 IMPLANT
GLOVE SURG ENC MOIS LTX SZ6.5 (GLOVE) ×4 IMPLANT
GLOVE SURG LTX SZ8 (GLOVE) ×4 IMPLANT
GLOVE SURG UNDER POLY LF SZ6.5 (GLOVE) ×2 IMPLANT
GLOVE SURG UNDER POLY LF SZ8 (GLOVE) ×2
GOWN STRL REUS W/TWL LRG LVL3 (GOWN DISPOSABLE) ×2 IMPLANT
GOWN STRL REUS W/TWL XL LVL3 (GOWN DISPOSABLE) ×2 IMPLANT
GUIDEWIRE GLENOID 2.5X220 (WIRE) ×2 IMPLANT
HANDPIECE INTERPULSE COAX TIP (DISPOSABLE) ×2
HEAD HUM LO AEQ 50X16 (Head) ×2 IMPLANT
HEMOSTAT SURGICEL 2X14 (HEMOSTASIS) IMPLANT
KIT BASIN OR (CUSTOM PROCEDURE TRAY) ×2 IMPLANT
KIT STABILIZATION SHOULDER (MISCELLANEOUS) ×2 IMPLANT
KIT TURNOVER KIT A (KITS) ×2 IMPLANT
MANIFOLD NEPTUNE II (INSTRUMENTS) ×2 IMPLANT
NEEDLE MAYO CATGUT SZ4 (NEEDLE) IMPLANT
NS IRRIG 1000ML POUR BTL (IV SOLUTION) ×2 IMPLANT
PACK SHOULDER (CUSTOM PROCEDURE TRAY) ×2 IMPLANT
PAD COLD SHLDR WRAP-ON (PAD) IMPLANT
PENCIL SMOKE EVACUATOR (MISCELLANEOUS) IMPLANT
RESTRAINT HEAD UNIVERSAL NS (MISCELLANEOUS) ×2 IMPLANT
SET HNDPC FAN SPRY TIP SCT (DISPOSABLE) ×1 IMPLANT
SLING ARM IMMOBILIZER XL (CAST SUPPLIES) ×2 IMPLANT
SLING ULTRA II L (ORTHOPEDIC SUPPLIES) IMPLANT
SLING ULTRA III MED (ORTHOPEDIC SUPPLIES) ×2 IMPLANT
STEM HUMERAL SZ2B STND 70 PTC (Stem) ×2 IMPLANT
STEM HUMERAL SZ2BSTD 70 PTC (Stem) ×1 IMPLANT
SUCTION FRAZIER HANDLE 12FR (TUBING) ×2
SUCTION TUBE FRAZIER 12FR DISP (TUBING) ×1 IMPLANT
SUT ETHIBOND 2 V 37 (SUTURE) ×2 IMPLANT
SUT ETHIBOND NAB CT1 #1 30IN (SUTURE) ×2 IMPLANT
SUT FIBERWIRE #2 38 T-5 BLUE (SUTURE) ×2
SUT FIBERWIRE #5 38 CONV NDL (SUTURE)
SUT MNCRL AB 4-0 PS2 18 (SUTURE) ×2 IMPLANT
SUT VIC AB 0 CT1 36 (SUTURE) IMPLANT
SUT VIC AB 3-0 SH 27 (SUTURE) ×2
SUT VIC AB 3-0 SH 27X BRD (SUTURE) ×1 IMPLANT
SUTURE FIBERWR #2 38 T-5 BLUE (SUTURE) ×1 IMPLANT
SUTURE FIBERWR #5 38 CONV NDL (SUTURE) IMPLANT
TOWEL OR 17X26 10 PK STRL BLUE (TOWEL DISPOSABLE) ×2 IMPLANT
TUBE SUCTION HIGH CAP CLEAR NV (SUCTIONS) ×2 IMPLANT
WATER STERILE IRR 1000ML POUR (IV SOLUTION) ×4 IMPLANT

## 2020-09-22 NOTE — Anesthesia Postprocedure Evaluation (Signed)
Anesthesia Post Note  Patient: Jason Russell  Procedure(s) Performed: SHOULDER HEMI ARTHROPLASTY (Right: Shoulder)     Patient location during evaluation: PACU Anesthesia Type: General Level of consciousness: awake and alert Pain management: pain level controlled Vital Signs Assessment: post-procedure vital signs reviewed and stable Respiratory status: spontaneous breathing, nonlabored ventilation, respiratory function stable and patient connected to nasal cannula oxygen Cardiovascular status: blood pressure returned to baseline and stable Postop Assessment: no apparent nausea or vomiting Anesthetic complications: yes (Soft palate injury from glidescope stylet during initial intubation attempt by SRNA)   No notable events documented.  Last Vitals:  Vitals:   09/22/20 1230 09/22/20 1245  BP: 128/69 136/67  Pulse: 61 (!) 56  Resp: 10 11  Temp:  36.5 C  SpO2: 99% 99%    Last Pain:  Vitals:   09/22/20 1245  TempSrc:   PainSc: Boykin Shacoria Latif

## 2020-09-22 NOTE — Progress Notes (Signed)
Assisted Dr. Brock with right, ultrasound guided, interscalene  block. Side rails up, monitors on throughout procedure. See vital signs in flow sheet. Tolerated Procedure well.  

## 2020-09-22 NOTE — Anesthesia Procedure Notes (Signed)
Anesthesia Regional Block: Interscalene brachial plexus block   Pre-Anesthetic Checklist: , timeout performed,  Correct Patient, Correct Site, Correct Laterality,  Correct Procedure, Correct Position, site marked,  Risks and benefits discussed,  Surgical consent,  Pre-op evaluation,  At surgeon's request and post-op pain management  Laterality: Right  Prep: chloraprep       Needles:  Injection technique: Single-shot  Needle Type: Echogenic Needle     Needle Length: 5cm  Needle Gauge: 21     Additional Needles:   Narrative:  Start time: 09/22/2020 9:05 AM End time: 09/22/2020 9:08 AM Injection made incrementally with aspirations every 5 mL.  Performed by: Personally  Anesthesiologist: Audry Pili, MD  Additional Notes: No pain on injection. No increased resistance to injection. Injection made in 5cc increments. Good needle visualization. Patient tolerated the procedure well.

## 2020-09-22 NOTE — Progress Notes (Signed)
Patient admitted to room. Alert and oriented x4. Pain at 8. Oxy given. Skin dry and warm to touch without any issues. Dressing to rt shoulder dry and intact.

## 2020-09-22 NOTE — Plan of Care (Signed)
  Problem: Activity: Goal: Risk for activity intolerance will decrease Outcome: Progressing   Problem: Nutrition: Goal: Adequate nutrition will be maintained Outcome: Progressing   Problem: Pain Managment: Goal: General experience of comfort will improve Outcome: Progressing   Problem: Safety: Goal: Ability to remain free from injury will improve Outcome: Progressing   

## 2020-09-22 NOTE — Op Note (Signed)
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  During the surgery it was clear that the deltoid was very atrophic and fibrotic not capable of full contractions and we worried about the ability of the patient to utilize a reverse social arthroplasty and were worried about the possibility of dislocation.  Based on this we felt that it was appropriate to consider hemiarthroplasty as this would minimize the chance of issues with dislocation while still hopefully providing him some pain relief.  We discussed this with his family member that was available via phone and she felt that it was an appropriate plan of care as a reverse was not in the patient's best interest.  Post-operative plan: The patient will be NWB in sling.  The patient will be will be admitted to observation due to medical complexity, monitoring and pain management.  DVT prophylaxis not indicated as patient already on full dose anticoagulant.  Pain control with PRN pain medication preferring oral medicines.  Follow up plan will be scheduled in approximately 7 days for incision check and XR.  Physical therapy to start after first visit.  Implants: Tornier size 2B stem, 50 mm low offset head  Post-Op Diagnosis: Same Surgeons:Primary: Hiram Gash, MD Assistants:Caroline McBane PA-C Location: [facility] ROOM 06 Anesthesia: General with Exparel Interscalene Antibiotics: Ancef 2g preop, Vancomycin $RemoveBeforeDEI'1000mg'HrnBLoqHNuvZxkOr$  locally Tourniquet time: None Estimated Blood Loss: 956 Complications: None Specimens: None Implants: Implant Name Type Inv. Item Serial No. Manufacturer Lot No. LRB No. Used Action  FLEX SHOULDER SYSTEM, HUMERAL HEAD 50MM, 16MM, LOW   [id number] [employer]   Right 1 Implanted  STEM HUMERAL SZ2B STND 70 PTC - [id number] Stem STEM HUMERAL SZ2B STND 70 PTC [id number] [employer]  Right 1 Implanted    Indications for Surgery:   Jason Russell is a 70 y.o. male with irreparable rotator cuff tear on CT arthrogram and early cuff tear arthropathy findings.  The patient on preoperative evaluation had questionable axillary nerve function however he reported that had some flickering of the deltoid though not normal and intact sensation which he felt was normal.  Benefits and risks of operative and nonoperative management were discussed prior to surgery with patient/guardian(s) and informed consent form was completed.  Infection and need for further surgery were discussed as was prosthetic stability and cuff issues.  We additionally specifically discussed risks of axillary nerve injury, infection, periprosthetic fracture, continued pain and longevity of implants prior to beginning procedure.      Procedure:   The patient was identified in the preoperative holding area where the surgical site was marked. Block placed by anesthesia with exparel.  The patient was taken to the OR where a procedural timeout was called and the above noted anesthesia was induced.  The patient was positioned beachchair on allen table with spider arm positioner.  Preoperative antibiotics were dosed.  The patient's right shoulder was prepped and draped in the usual sterile fashion.  A second preoperative timeout was called.       We began with a deltopectoral incision making the incision was sharply achieving hemostasis in progress.  We found protect the deltoid pectoralis interval and retracted the cephalic vein laterally.  We preserve the bridging veins to the deltoid.  It was noted at this point  the deltoid was fibrotic and was not normally contractile as compared to the pectoralis.  We finished her deltopectoral approach to ensure that we could identify if there was still any  appropriate tissue.  The deltoid appeared very atypical and we do not feel that it would support a reverse social arthroplasty.  We discussed the case with our family member that was present that we felt that a hemiarthroplasty would be in the patient's best interest.  We examined and found that he had an irreparable rotator cuff tear superiorly but his subscapularis was intact.  The upper border of the subscapularis was partially avulsed.   At this point we proceeded with hemiarthroplasty.  We did a subscapularis peel take down the remainder the subscapularis and doing a capsular release in order to expose the proximal humerus.  There was abrasions and full-thickness cartilage loss in the area superiorly from the proximal migration and impingement of the humeral head on the acromion.  We examined the supraspinatus was irreparably torn.  We made a 20 degree retroverted head And broached and prepared for the flex short stems.  A size 2B stem of attain appropriate purchase and we sized with a 50 mm head.  This was a low offset head and we are able to get appropriate fit and stability.  We preserve the coracohumeral acromial ligament throughout this portion of the case.  We then irrigated copiously and removed our trial implants.  We then passed four #5 FiberWire sutures through bone tunnels in the lesser tuberosity and used these for eventual subscapularis repair.  Final implants were placed with appropriate fit.  We reduced the joint and repaired the subscapularis with robust fixation.  There was no repairable superior cuff.  Incision was irrigated copiously and closed in a multilayer fashion observable suture.  Sterile dressing was placed.  Patient awoken taken to PACU in stable condition in a sling.   Noemi Chapel, PA-C, present and scrubbed throughout the case, critical for completion in a timely fashion, and for retraction, instrumentation, closure.

## 2020-09-22 NOTE — Transfer of Care (Signed)
Immediate Anesthesia Transfer of Care Note  Patient: Jason Russell  Procedure(s) Performed: Procedure(s): SHOULDER HEMI ARTHROPLASTY (Right)  Patient Location: PACU  Anesthesia Type:General  Level of Consciousness:  sedated, patient cooperative and responds to stimulation  Airway & Oxygen Therapy:Patient Spontanous Breathing and Patient connected to face mask oxgen  Post-op Assessment:  Report given to PACU RN and Post -op Vital signs reviewed and stable  Post vital signs:  Reviewed and stable  Last Vitals:  Vitals:   09/22/20 0906 09/22/20 0911  BP: 114/70   Pulse: (!) 59 (!) 56  Resp: 14 13  Temp:    SpO2: 945% 038%    Complications: No apparent anesthesia complications

## 2020-09-22 NOTE — Anesthesia Procedure Notes (Addendum)
Procedure Name: Intubation Date/Time: 09/22/2020 10:01 AM Performed by: Lavina Hamman, CRNA Pre-anesthesia Checklist: Patient identified, Emergency Drugs available, Patient being monitored, Suction available and Timeout performed Patient Re-evaluated:Patient Re-evaluated prior to induction Oxygen Delivery Method: Circle system utilized Preoxygenation: Pre-oxygenation with 100% oxygen Induction Type: IV induction Ventilation: Two handed mask ventilation required and Oral airway inserted - appropriate to patient size Laryngoscope Size: Glidescope and 4 Grade View: Grade I Tube size: 7.5 mm Number of attempts: 3 Airway Equipment and Method: Rigid stylet, Video-laryngoscopy, Bite block and Oral airway Placement Confirmation: ETT inserted through vocal cords under direct vision, positive ETCO2 and breath sounds checked- equal and bilateral Secured at: 25 cm Tube secured with: Tape Dental Injury: Bloody posterior oropharynx  Difficulty Due To: Difficult Airway- due to limited oral opening and Difficult Airway- due to dentition Future Recommendations: Recommend- induction with short-acting agent, and alternative techniques readily available Comments: First attempt using glidescope by SRNA. When styletted ETT appeared on Glidescope screen after difficult insertion, it appeared that there may have been tissue surrounding/on the ETT, which along with blood pooling in the posterior oropharynx, suggested possible soft palate or tonsillar injury. Dr. Fransisco Beau took over at this point. ETT removed. Unable to see injury with glidescope given small mouth opening. After suctioning posterior oropharynx, intubated by Dr. Fransisco Beau using glidescope. Posterior oropharynx packed with rolled gauze per ENT recommendation. Will reevaluate at end of procedure.

## 2020-09-22 NOTE — Interval H&P Note (Signed)
History and Physical Interval Note:  09/22/2020 9:33 AM  Jason Russell  has presented today for surgery, with the diagnosis of djd right shoulder.  The various methods of treatment have been discussed with the patient and family. After consideration of risks, benefits and other options for treatment, the patient has consented to  Procedure(s): REVERSE SHOULDER ARTHROPLASTY (Right) as a surgical intervention.  The patient's history has been reviewed, patient examined, no change in status, stable for surgery.  I have reviewed the patient's chart and labs.  Questions were answered to the patient's satisfaction.     Hiram Gash

## 2020-09-22 NOTE — Discharge Instructions (Signed)
Ophelia Charter MD, MPH Noemi Chapel, PA-C [place] [address] [phone] (tel)   [phone] (fax)   Petal may leave the operative dressing in place until your follow-up appointment. KEEP THE INCISIONS CLEAN AND DRY. There may be a small amount of fluid/bleeding leaking at the surgical site. This is normal after surgery.  If it fills with liquid or blood please call us immediately to change it for you. Use the provided ice machine or Ice packs as often as possible for the first 3-4 days, then as needed for pain relief.   Keep a layer of cloth or a shirt between your skin and the cooling unit to prevent frost bite as it can get very cold.  SHOWERING: - You may shower on Post-Op Day #2.  - The dressing is water resistant but do not scrub it as it may start to peel up.   - You may remove the sling for showering, but keep a water resistant pillow under the arm to keep both the elbow and shoulder away from the body (mimicking the abduction sling).  - Gently pat the area dry.  - Do not soak the shoulder in water. Do not go swimming in the pool or ocean until your incision has completely healed (about 4-6 weeks after surgery) - KEEP THE INCISIONS CLEAN AND DRY.  EXERCISES Wear the sling at all times  You may remove the sling for showering, but keep the arm across the chest or in a secondary sling.    Accidental/Purposeful External Rotation and shoulder flexion (reaching behind you) is to be avoided at all costs for the first month. It is ok to come out of your sling if your are sitting and have assistance for eating.   Do not lift anything heavier than 1 pound until we discuss it further in clinic.   REGIONAL ANESTHESIA (NERVE BLOCKS) The anesthesia team may have performed a nerve block for you if safe in the setting of your care.  This is a great tool used to minimize pain.   Typically the block may start wearing off overnight but the long acting medicine may last for 3-4 days.  The nerve block wearing off can be a challenging period but please utilize your as needed pain medications to try and manage this period.    POST-OP MEDICATIONS- Multimodal approach to pain control In general your pain will be controlled with a combination of substances.  Prescriptions unless otherwise discussed are electronically sent to your pharmacy.  This is a carefully made plan we use to minimize narcotic use.     Acetaminophen - Non-narcotic pain medicine taken on a scheduled basis  Take two 500 mg tablets (1,000 mg total) every 8 hours Gabapentin - this is a non-narcotic medication to help with pain, take on a scheduled basis Robaxin - this is a muscle relaxer, take as needed for muscle spasms Oxycodone - This is a strong narcotic, to be used only on an "as needed" basis for SEVERE pain. Zofran -  take as needed for nausea   FOLLOW-UP If you develop a Fever (>101.5), Redness or Drainage from the surgical incision site, please call our office to arrange for an evaluation. Please call the office to schedule a follow-up appointment for a wound check, 7-10 days post-operatively.  IF YOU HAVE ANY QUESTIONS, PLEASE FEEL FREE TO CALL OUR OFFICE.  HELPFUL INFORMATION  If you had  a block, it will wear off between 8-24 hrs postop typically.  This is period when your pain may go from nearly zero to the pain you would have had post-op without the block.  This is an abrupt transition but nothing dangerous is happening.  You may take an extra dose of narcotic when this happens.  Your arm will be in a sling following surgery. You will be in this sling for the next 4 weeks.  I will let you know the exact duration at your follow-up visit.  You may be more comfortable sleeping in a semi-seated position the first few nights following surgery.  Keep a pillow propped under the elbow and forearm for  comfort.  If you have a recliner type of chair it might be beneficial.  If not that is fine too, but it would be helpful to sleep propped up with pillows behind your operated shoulder as well under your elbow and forearm.  This will reduce pulling on the suture lines.  When dressing, put your operative arm in the sleeve first.  When getting undressed, take your operative arm out last.  Loose fitting, button-down shirts are recommended.  In most states it is against the law to drive while your arm is in a sling. And certainly against the law to drive while taking narcotics.  You may return to work/school in the next couple of days when you feel up to it. Desk work and typing in the sling is fine.  We suggest you use the pain medication the first night prior to going to bed, in order to ease any pain when the anesthesia wears off. You should avoid taking pain medications on an empty stomach as it will make you nauseous.  Do not drink alcoholic beverages or take illicit drugs when taking pain medications.  Pain medication may make you constipated.  Below are a few solutions to try in this order: Decrease the amount of pain medication if you aren't having pain. Drink lots of decaffeinated fluids. Drink prune juice and/or each dried prunes  If the first 3 don't work start with additional solutions Take Colace - an over-the-counter stool softener Take Senokot - an over-the-counter laxative Take Miralax - a stronger over-the-counter laxative   Dental Antibiotics:  In most cases prophylactic antibiotics for Dental procdeures after total joint surgery are not necessary.  Exceptions are as follows:  1. History of prior total joint infection  2. Severely immunocompromised (Organ Transplant, cancer chemotherapy, Rheumatoid biologic meds such as Corrales)  3. Poorly controlled diabetes (A1C &gt; 8.0, blood glucose over 200)  If you have one of these conditions, contact your surgeon for an  antibiotic prescription, prior to your dental procedure.   For more information including helpful videos and documents visit our website:   [email or web address]

## 2020-09-22 NOTE — Anesthesia Preprocedure Evaluation (Addendum)
Anesthesia Evaluation  Patient identified by MRN, date of birth, ID band Patient awake    Reviewed: Allergy & Precautions, NPO status , Patient's Chart, lab work & pertinent test results  History of Anesthesia Complications Negative for: history of anesthetic complications  Airway Mallampati: II  TM Distance: >3 FB Neck ROM: Full    Dental  (+) Dental Advisory Given, Teeth Intact   Pulmonary asthma , sleep apnea , COPD, former smoker,   Right lung cancer    Pulmonary exam normal        Cardiovascular hypertension, Pt. on medications and Pt. on home beta blockers Normal cardiovascular exam+ dysrhythmias Atrial Fibrillation + Cardiac Defibrillator + Valvular Problems/Murmurs AI    '20 TTE - EF 35-40%. Mildly increased left ventricular wall thickness. Left ventricular diastolic Doppler parameters are consistent with impaired relaxation. The right ventricle has mildly reduced systolic function. Aortic valve regurgitation is mild to moderate by color flow Doppler.     Neuro/Psych negative neurological ROS  negative psych ROS   GI/Hepatic negative GI ROS, Neg liver ROS,   Endo/Other  Hypothyroidism   Renal/GU CRFRenal disease     Musculoskeletal  (+) Arthritis ,  Gout    Abdominal   Peds  Hematology  (+) anemia ,  On xarelto, last dose 3 days ago    Anesthesia Other Findings   Reproductive/Obstetrics                            Anesthesia Physical Anesthesia Plan  ASA: 4  Anesthesia Plan: General   Post-op Pain Management:  Regional for Post-op pain   Induction: Intravenous  PONV Risk Score and Plan: 2 and Treatment may vary due to age or medical condition, Ondansetron and Dexamethasone  Airway Management Planned: Oral ETT  Additional Equipment: None  Intra-op Plan:   Post-operative Plan: Extubation in OR  Informed Consent: I have reviewed the patients History and Physical,  chart, labs and discussed the procedure including the risks, benefits and alternatives for the proposed anesthesia with the patient or authorized representative who has indicated his/her understanding and acceptance.     Dental advisory given  Plan Discussed with: CRNA and Anesthesiologist  Anesthesia Plan Comments: (Will plan to place magnet on AICD per EP recommendations )       Anesthesia Quick Evaluation

## 2020-09-23 DIAGNOSIS — N1832 Chronic kidney disease, stage 3b: Secondary | ICD-10-CM | POA: Diagnosis not present

## 2020-09-23 DIAGNOSIS — I130 Hypertensive heart and chronic kidney disease with heart failure and stage 1 through stage 4 chronic kidney disease, or unspecified chronic kidney disease: Secondary | ICD-10-CM | POA: Diagnosis not present

## 2020-09-23 DIAGNOSIS — M75101 Unspecified rotator cuff tear or rupture of right shoulder, not specified as traumatic: Secondary | ICD-10-CM | POA: Diagnosis not present

## 2020-09-23 DIAGNOSIS — J45909 Unspecified asthma, uncomplicated: Secondary | ICD-10-CM | POA: Diagnosis not present

## 2020-09-23 DIAGNOSIS — Z85118 Personal history of other malignant neoplasm of bronchus and lung: Secondary | ICD-10-CM | POA: Diagnosis not present

## 2020-09-23 DIAGNOSIS — E039 Hypothyroidism, unspecified: Secondary | ICD-10-CM | POA: Diagnosis not present

## 2020-09-23 LAB — BASIC METABOLIC PANEL
Anion gap: 5 (ref 5–15)
BUN: 30 mg/dL — ABNORMAL HIGH (ref 8–23)
CO2: 24 mmol/L (ref 22–32)
Calcium: 8.6 mg/dL — ABNORMAL LOW (ref 8.9–10.3)
Chloride: 108 mmol/L (ref 98–111)
Creatinine, Ser: 1.59 mg/dL — ABNORMAL HIGH (ref 0.61–1.24)
GFR, Estimated: 47 mL/min — ABNORMAL LOW (ref >60)
Glucose, Bld: 131 mg/dL — ABNORMAL HIGH (ref 70–99)
Potassium: 4.1 mmol/L (ref 3.5–5.1)
Sodium: 137 mmol/L (ref 135–145)

## 2020-09-23 LAB — CBC
HCT: 38.4 % — ABNORMAL LOW (ref 39.0–52.0)
Hemoglobin: 11.8 g/dL — ABNORMAL LOW (ref 13.0–17.0)
MCH: 28.9 pg (ref 26.0–34.0)
MCHC: 30.7 g/dL (ref 30.0–36.0)
MCV: 93.9 fL (ref 80.0–100.0)
Platelets: 144 10*3/uL — ABNORMAL LOW (ref 150–400)
RBC: 4.09 MIL/uL — ABNORMAL LOW (ref 4.22–5.81)
RDW: 14.5 % (ref 11.5–15.5)
WBC: 11.8 10*3/uL — ABNORMAL HIGH (ref 4.0–10.5)
nRBC: 0 % (ref 0.0–0.2)

## 2020-09-23 MED ORDER — OXYCODONE HCL 5 MG PO TABS: ORAL_TABLET | ORAL | 0 refills | Status: AC

## 2020-09-23 MED ORDER — ACETAMINOPHEN 500 MG PO TABS: 1000.0000 mg | ORAL_TABLET | Freq: Three times a day (TID) | ORAL | 0 refills | Status: AC

## 2020-09-23 MED ORDER — ONDANSETRON HCL 4 MG PO TABS: 4.0000 mg | ORAL_TABLET | Freq: Three times a day (TID) | ORAL | 0 refills | Status: AC | PRN

## 2020-09-23 MED ORDER — METHOCARBAMOL 500 MG PO TABS: 500.0000 mg | ORAL_TABLET | Freq: Three times a day (TID) | ORAL | 0 refills | Status: DC | PRN

## 2020-09-23 MED ORDER — GABAPENTIN 100 MG PO CAPS: 100.0000 mg | ORAL_CAPSULE | Freq: Three times a day (TID) | ORAL | 0 refills | Status: DC

## 2020-09-23 NOTE — Plan of Care (Signed)

## 2020-09-23 NOTE — Discharge Summary (Signed)
Patient ID: Jason Russell MRN: [id number] DOB/AGE: 07-25-1950 70 y.o.  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 St. Jude ICD implantation 10/03/12.   Chronic anticoagulation    Chronic fatigue 2/37/6283   Chronic systolic heart failure ([place])    a. Echo 7/13: EF 25%;  b. echo 04/2012:  Mild LVH, EF 30-35%, Gr 1 DD, mild AI, mild MR, mild LAE   COPD (chronic obstructive pulmonary disease) (HCC)    Dyslipidemia    Gout    History of blood transfusion 10/15/2013   "don't know where the blood's going; HgB down to 5"   Hyperlipidemia    Hypertension    Hypothyroidism    NICM (nonischemic cardiomyopathy) ([place])    [place] 4/14:  minimal CAD   Obesity    OSA on CPAP    Tobacco abuse      Procedures Performed: Right shoulder hemiarthroplasty  Discharged Condition: good  Hospital Course: Patient brought in to [facility] for scheduled surgery.  We had concerns prior to surgery he had intact sensation.  During the surgery it was clear that the deltoid was very atrophic and fibrotic not capable of full contractions and we worried about the ability of the patient to utilize a reverse social arthroplasty and were worried about the possibility of dislocation.  Based on this we felt that it was appropriate to consider hemiarthroplasty as this would minimize the chance of issues with dislocation while still hopefully providing him some pain relief. This was discussed with his  family via phone. It was also previously discussed with the patient the high risk of dislocation if his axillary nerve function is not adequate. He tolerated procedure well.  He was kept for monitoring overnight for pain control and medical monitoring postop and was found to be stable for DC home the morning after surgery.  Patient was instructed on specific activity restrictions and all questions were answered.  Consults: Occupational therapy  Significant Diagnostic Studies: No additional pertinent studies  Treatments: Surgery  Discharge Exam: PE General: NAD, sititng up in hospital bed RUE: Dressing CDI and sling well fitting,  full and painless ROM throughout hand with [facility] of 0.  Axillary nerve sensation/motor altered in setting of block and unable to be fully tested. Distal motor and sensory altered in setting of block. Warm well perfused hand.   Disposition: Discharge disposition: 01-Home or Self Care       Discharge Instructions     Call MD for:  redness, tenderness, or signs of infection (pain, swelling, redness, odor or green/yellow discharge around incision site)   Complete by: As directed    Call MD for:  severe uncontrolled pain   Complete by: As directed    Call MD for:  temperature >100.4   Complete by: As directed    Diet - low sodium heart healthy   Complete by: As directed    Increase activity slowly   Complete by: As directed  Allergies as of 09/23/2020   No Known Allergies      Medication List     TAKE these medications    acetaminophen 500 MG tablet Commonly known as: TYLENOL Take 2 tablets (1,000 mg total) by mouth every 8 (eight) hours for 14 days. What changed:  when to take this reasons to take this   albuterol 108 (90 Base) MCG/ACT inhaler Commonly known as: VENTOLIN HFA INHALE 2 PUFFS INTO THE LUNGS EVERY 4 HOURS AS NEEDED FOR WHEEZING OR SHORTNESS OF BREATH.   amLODipine 5 MG tablet Commonly known as: NORVASC Take 5 mg by mouth  daily.   atorvastatin 40 MG tablet Commonly known as: LIPITOR Take 1 tablet (40 mg total) by mouth daily.   [employer] D3 50 MCG (2000 UT) Caps Generic drug: Cholecalciferol Take 2,000 Units by mouth daily.   ferrous gluconate 324 MG tablet Commonly known as: FERGON Take 1 tablet (324 mg total) by mouth 3 (three) times daily with meals.   furosemide 40 MG tablet Commonly known as: LASIX Take 20 mg by mouth daily. Patient takes 1/2 half tablet by mouth (20 mg Total) once daily   gabapentin 100 MG capsule Commonly known as: NEURONTIN Take 1 capsule (100 mg total) by mouth 3 (three) times daily. For pain   magnesium oxide 400 (241.3 Mg) MG tablet Commonly known as: MAG-OX Take 1 tablet (400 mg total) by mouth daily.   methocarbamol 500 MG tablet Commonly known as: Robaxin Take 1 tablet (500 mg total) by mouth every 8 (eight) hours as needed for muscle spasms.   ondansetron 4 MG tablet Commonly known as: Zofran Take 1 tablet (4 mg total) by mouth every 8 (eight) hours as needed for up to 7 days for nausea or vomiting.   oxyCODONE 5 MG immediate release tablet Commonly known as: Oxy IR/ROXICODONE Take 1-2 pills every 6 hrs as needed for severe pain, no more than 6 per day   REFRESH OP Place 1 drop into both eyes every morning.   sacubitril-valsartan 97-103 MG Commonly known as: ENTRESTO Take 1 tablet by mouth 2 (two) times daily.   sildenafil 100 MG tablet Commonly known as: VIAGRA Take 1 tablet (100 mg total) by mouth daily as needed for erectile dysfunction.   spironolactone 25 MG tablet Commonly known as: ALDACTONE Take 1 tablet (25 mg total) by mouth daily.   Uloric 40 MG tablet Generic drug: febuxostat Take 1 tablet (40 mg total) by mouth daily.       ASK your doctor about these medications    amiodarone 200 MG tablet Commonly known as: PACERONE Take 1 tablet (200 mg total) by mouth daily. Except Saturday and Sunday   carvedilol 12.5 MG tablet Commonly  known as: COREG TAKE 1 TABLET BY MOUTH TWICE A DAY   Fluticasone-Umeclidin-Vilant 100-62.5-25 MCG/INH Aepb TAKE 1 PUFF BY MOUTH EVERY DAY   hydrALAZINE 100 MG tablet Commonly known as: APRESOLINE TAKE 1 TABLET BY MOUTH THREE TIMES A DAY   levothyroxine 50 MCG tablet Commonly known as: SYNTHROID TAKE 1 TABLET BY MOUTH DAILY BEFORE BREAKFAST   Potassium Chloride ER 20 MEQ Tbcr TAKE 1 TABLET BY MOUTH EVERY DAY   Xarelto 20 MG Tabs tablet Generic drug: rivaroxaban TAKE 1 TABLET BY MOUTH EVERY DAY           Noemi Chapel, PA-C 09/23/2020

## 2020-09-23 NOTE — Evaluation (Signed)
Occupational Therapy Evaluation Patient Details Name: Jason Russell MRN: [id number] DOB: [birth year] Today's Date: 09/23/2020    History of Present Illness Patient is a 70 year old male admitted 6/22 for Right shoulder hemiarthroplasty. PMH includes HTN, OSA on CPAP, hypothyroidism, CKD Stage III, atrial fibrillation (on Xarelto), NICM with ICD in place, lung cancer.   Clinical Impression   Patient is a 71 year old male s/p shoulder replacement without functional use of right dominant upper extremity secondary to effects of surgery, interscalene block and shoulder precautions. Therapist provided education and instruction to patient in regards to exercises, precautions, positioning, donning upper extremity clothing and bathing while maintaining shoulder precautions, ice and edema management and donning/doffing sling. Patient verbalized understanding and demonstrated as needed. Patient needed assistance to donn shirt, pants, and provided with instruction on compensatory strategies to perform ADLs. Patient to follow up with MD for further therapy needs.      Follow Up Recommendations  Follow surgeon's recommendation for DC plan and follow-up therapies    Equipment Recommendations  None recommended by OT       Precautions / Restrictions Precautions Precautions: Shoulder Type of Shoulder Precautions: AROM elbow, wrist, hand ok. P/AROM shoulder NO Shoulder Interventions: Shoulder sling/immobilizer;Off for dressing/bathing/exercises Precaution Booklet Issued: Yes (comment) Required Braces or Orthoses: Sling Restrictions Weight Bearing Restrictions: Yes RUE Weight Bearing: Non weight bearing      Mobility Bed Mobility Overal bed mobility: Modified Independent             General bed mobility comments: HOB elevated, educated can sleep in recliner vs bed at home    Transfers Overall transfer level: Independent Equipment used: None                  Balance Overall  balance assessment: No apparent balance deficits (not formally assessed)                                         ADL either performed or assessed with clinical judgement   ADL Overall ADL's : Needs assistance/impaired     Grooming: Independent;Standing;Wash/dry hands   Upper Body Bathing: Minimal assistance;Sitting;Standing   Lower Body Bathing: Modified independent;Sitting/lateral leans;Sit to/from stand   Upper Body Dressing : Minimal assistance;Standing;Cueing for sequencing Upper Body Dressing Details (indicate cue type and reason): to pull shirt over R shoulder Lower Body Dressing: Minimal assistance;Sit to/from stand Lower Body Dressing Details (indicate cue type and reason): to buckle pants and belt Toilet Transfer: Independent;Ambulation;Regular Toilet   Toileting- Water quality scientist and Hygiene: Independent;Sit to/from stand Toileting - Clothing Manipulation Details (indicate cue type and reason): patient utilize toilet prior to getting dressed therefore only in hospital gown, educate patient to wear sweat pants or gym shorts at home vs pants with buckle for ease of dressing/clothing management. verbalize understanding     Functional mobility during ADLs: Independent General ADL Comments: patient educated in compensatory strategies for self care tasks in order to maintain shoulder precautions.      Pertinent Vitals/Pain Pain Assessment: Faces Faces Pain Scale: Hurts a little bit Pain Location: R shoulder Pain Descriptors / Indicators: Heaviness Pain Intervention(s): Monitored during session;Premedicated before session     Hand Dominance Right   Extremity/Trunk Assessment Upper Extremity Assessment Upper Extremity Assessment: RUE deficits/detail RUE Deficits / Details: wrist and hand ROM returned, nerve block residual in elbow and proximally   Lower Extremity Assessment Lower Extremity Assessment:  Overall [facility] for tasks assessed   Cervical /  Trunk Assessment Cervical / Trunk Assessment: Normal   Communication Communication Communication: No difficulties   Cognition Arousal/Alertness: Awake/alert Behavior During Therapy: WFL for tasks assessed/performed Overall Cognitive Status: Within Functional Limits for tasks assessed                                        Exercises Exercises: Shoulder   Shoulder Instructions Shoulder Instructions Donning/doffing shirt without moving shoulder: Minimal assistance;Patient able to independently direct caregiver Method for sponge bathing under operated UE: Minimal assistance;Patient able to independently direct caregiver Donning/doffing sling/immobilizer: Moderate assistance;Patient able to independently direct caregiver Correct positioning of sling/immobilizer: Independent Pendulum exercises (written home exercise program):  (N/A) ROM for elbow, wrist and digits of operated UE: Patient able to independently direct caregiver Sling wearing schedule (on at all times/off for ADL's): Patient able to independently direct caregiver Proper positioning of operated UE when showering: Patient able to independently direct caregiver Dressing change:  (N/A) Positioning of UE while sleeping: Patient able to independently direct caregiver    Home Living Family/patient expects to be discharged to:: Private residence Living Arrangements: Alone;Other (Comment) (cousin coming to stay for few days) Available Help at Discharge: Family Type of Home: House Home Access: Stairs to enter Technical brewer of Steps: 2   Home Layout: One level     Bathroom Shower/Tub: Occupational psychologist: Handicapped height     Home Equipment: Shower seat          Prior Functioning/Environment Level of Independence: Independent                 OT Problem List: Pain;Impaired UE functional use;Obesity;Decreased knowledge of precautions         OT Goals(Current goals can be  found in the care plan section) Acute Rehab OT Goals Patient Stated Goal: get back to fishing OT Goal Formulation: All assessment and education complete, DC therapy   AM-PAC OT "6 Clicks" Daily Activity     Outcome Measure Help from another person eating meals?: None Help from another person taking care of personal grooming?: None Help from another person toileting, which includes using toliet, bedpan, or urinal?: A Little Help from another person bathing (including washing, rinsing, drying)?: A Little Help from another person to put on and taking off regular upper body clothing?: A Little Help from another person to put on and taking off regular lower body clothing?: A Little 6 Click Score: 20   End of Session Equipment Utilized During Treatment: Other (comment) (sling) Nurse Communication: Mobility status;Other (comment) (OT complete)  Activity Tolerance: Patient tolerated treatment well Patient left: in chair;with call bell/phone within reach  OT Visit Diagnosis: Pain Pain - Right/Left: Right Pain - part of body: Shoulder                Time: 7356-7014 OT Time Calculation (min): 28 min Charges:  OT General Charges $OT Visit: 1 Visit OT Evaluation $OT Eval Low Complexity: 1 Low OT Treatments $Self Care/Home Management : 8-22 mins  Delbert Phenix OT OT pager: [place] 09/23/2020, 9:52 AM

## 2020-09-23 NOTE — Plan of Care (Signed)
  Problem: Activity: Goal: Risk for activity intolerance will decrease Outcome: Progressing   Problem: Pain Managment: Goal: General experience of comfort will improve Outcome: Progressing   Problem: Safety: Goal: Ability to remain free from injury will improve Outcome: Progressing   

## 2020-09-23 NOTE — Progress Notes (Signed)
Discharge package printed and instructions given to pt. Verbalizes understanding.

## 2020-09-24 ENCOUNTER — Encounter (HOSPITAL_COMMUNITY): Payer: Self-pay | Admitting: Orthopaedic Surgery

## 2020-09-25 ENCOUNTER — Other Ambulatory Visit: Payer: Self-pay | Admitting: Internal Medicine

## 2020-09-25 DIAGNOSIS — M6281 Muscle weakness (generalized): Secondary | ICD-10-CM | POA: Diagnosis not present

## 2020-09-25 DIAGNOSIS — M25611 Stiffness of right shoulder, not elsewhere classified: Secondary | ICD-10-CM | POA: Diagnosis not present

## 2020-09-25 DIAGNOSIS — M25511 Pain in right shoulder: Secondary | ICD-10-CM | POA: Diagnosis not present

## 2020-09-26 ENCOUNTER — Other Ambulatory Visit: Payer: Self-pay | Admitting: Internal Medicine

## 2020-09-27 NOTE — Telephone Encounter (Signed)
Rx(s) sent to pharmacy electronically.  

## 2020-09-29 ENCOUNTER — Ambulatory Visit (INDEPENDENT_AMBULATORY_CARE_PROVIDER_SITE_OTHER): Payer: Medicare Other

## 2020-09-29 DIAGNOSIS — I5022 Chronic systolic (congestive) heart failure: Secondary | ICD-10-CM

## 2020-09-29 DIAGNOSIS — Z95810 Presence of automatic (implantable) cardiac defibrillator: Secondary | ICD-10-CM

## 2020-09-29 NOTE — Progress Notes (Signed)
Pt should be taking 40 mg lasix every day for 3 days then 40 mg every other day (20 mg on odd days)  F/U BMET and BNP in 10 days

## 2020-09-29 NOTE — Progress Notes (Signed)
EPIC Encounter for ICM Monitoring  Patient Name: Jason Russell is a 70 y.o. male Date: 09/29/2020 Primary Care Physican: Lucianne Lei, MD Primary Cardiologist: Harrington Challenger Electrophysiologist: Caryl Comes 09/21/2020 Weight: 283 lbs   Spoke with patient and heart failure questions reviewed.  Pt asymptomatic for fluid accumulation.  Pt had shoulder surgery 6/22 which correlates with decreased impedance.   Unable to weigh at this time due to recent shoulder surgery.   CorVue Thoracic impedance suggesting possible fluid accumulation starting 09/23/2020.    Prescribed:  Furosemide 40 mg take 0.5 tablet (20 mg total) by mouth daily.  Confirmed 6/29 he is taking the prescribed dosage.  Potassium 20 mEq 1 tablet daily.   Labs: 09/23/2020 Creatinine 1.59, BUN 30, Potassium 4.1, Sodium 137, GFR 47 09/16/2020 Creatinine 1.87, BUN 39, Potassium 3.7, Sodium 138, GFR 39 06/21/2020 Creatinine 1.98, BUN 43, Potassium 4.1, Sodium 139, GFR 36 04/22/2020 Creatinine 2.12, BUN 44, Potassium 4.4, Sodium 142, GFR 33 A complete set of results can be found in results review   Recommendations:   Copy sent to Dr Harrington Challenger for review and recommendations if needed.   Follow-up plan: ICM clinic phone appointment on 10/05/2020 (manual) to recheck fluid levels.   91 day device clinic remote transmission 10/18/2020.     EP/Cardiology Office Visits:    Last OV with Dr Harrington Challenger was 06/03/2020 (no recall for 2023 appointment).   Copy of ICM check sent to Dr. Caryl Comes.  3 month ICM trend: 09/29/2020.    1 Year ICM trend:       [name], RN 09/29/2020 7:44 AM

## 2020-09-30 ENCOUNTER — Telehealth: Payer: Self-pay

## 2020-09-30 DIAGNOSIS — M25511 Pain in right shoulder: Secondary | ICD-10-CM | POA: Diagnosis not present

## 2020-09-30 DIAGNOSIS — I5022 Chronic systolic (congestive) heart failure: Secondary | ICD-10-CM

## 2020-09-30 NOTE — Telephone Encounter (Signed)
Author: Fay Records, MD Service: Cardiology Author Type: Physician  Filed: 09/29/2020 10:00 PM Encounter Date: 09/29/2020 Status: Signed  Editor: Fay Records, MD (Physician)          Pt should be taking 40 mg lasix every day for 3 days then 40 mg every other day (20 mg on odd days)   F/U BMET and BNP in 10 day       Left message for patient to call back.

## 2020-09-30 NOTE — Telephone Encounter (Signed)
-----   Message from Fay Records, MD sent at 09/29/2020  9:59 PM EDT -----    ----- Message ----- From: [name], RN Sent: 09/29/2020  10:40 AM EDT To: Fay Records, MD  Dr Harrington Challenger, would you please review.  He is asymptomatic but Corvue suggesting large amount of fluid post op shoulder surgery on 6/22.  Thank you.

## 2020-10-01 ENCOUNTER — Encounter: Payer: Self-pay | Admitting: Internal Medicine

## 2020-10-01 NOTE — Progress Notes (Signed)
Patient notified of Dr Alan Ripper recommendations by Mike Craze, RN

## 2020-10-01 NOTE — Telephone Encounter (Signed)
Patient is returning call.  °

## 2020-10-01 NOTE — Telephone Encounter (Signed)
Pt aware of recommendations and verbalizes understanding ./cy 

## 2020-10-01 NOTE — Telephone Encounter (Signed)
Lm to call back ./cy 

## 2020-10-05 ENCOUNTER — Ambulatory Visit (INDEPENDENT_AMBULATORY_CARE_PROVIDER_SITE_OTHER): Payer: Medicare Other

## 2020-10-05 DIAGNOSIS — M25611 Stiffness of right shoulder, not elsewhere classified: Secondary | ICD-10-CM | POA: Diagnosis not present

## 2020-10-05 DIAGNOSIS — Z95810 Presence of automatic (implantable) cardiac defibrillator: Secondary | ICD-10-CM

## 2020-10-05 DIAGNOSIS — M6281 Muscle weakness (generalized): Secondary | ICD-10-CM | POA: Diagnosis not present

## 2020-10-05 DIAGNOSIS — M25511 Pain in right shoulder: Secondary | ICD-10-CM | POA: Diagnosis not present

## 2020-10-05 DIAGNOSIS — I5022 Chronic systolic (congestive) heart failure: Secondary | ICD-10-CM

## 2020-10-06 ENCOUNTER — Telehealth: Payer: Self-pay

## 2020-10-06 MED ORDER — FUROSEMIDE 40 MG PO TABS: ORAL_TABLET | ORAL | 3 refills | Status: DC

## 2020-10-06 NOTE — Progress Notes (Signed)
EPIC Encounter for ICM Monitoring  Patient Name: Jason Russell is a 70 y.o. male Date: 10/06/2020 Primary Care Physican: Lucianne Lei, MD Primary Cardiologist: Harrington Challenger Electrophysiologist: Caryl Comes 09/21/2020 Weight: 283 lbs (not weighing at home)   Spoke with patient and heart failure questions reviewed.  Pt asymptomatic for fluid accumulation.     CorVue Thoracic impedance suggesting fluid levels returned to normal after Fursoemide adjustment by Dr Harrington Challenger.    Prescribed:  Furosemide 40 mg take 0.5 tablet (20 mg total) by mouth evyer other day alternating with 1 tablet (40 mg total) every other day.  Potassium 20 mEq 1 tablet daily.   Labs: 09/23/2020 Creatinine 1.59, BUN 30, Potassium 4.1, Sodium 137, GFR 47 09/16/2020 Creatinine 1.87, BUN 39, Potassium 3.7, Sodium 138, GFR 39 06/21/2020 Creatinine 1.98, BUN 43, Potassium 4.1, Sodium 139, GFR 36 04/22/2020 Creatinine 2.12, BUN 44, Potassium 4.4, Sodium 142, GFR 33 A complete set of results can be found in results review   Recommendations:  No changes and encouraged to call if experiencing any fluid symptoms.   Follow-up plan: ICM clinic phone appointment on 10/25/2020.   91 day device clinic remote transmission 10/18/2020.     EP/Cardiology Office Visits:    Last OV with Dr Harrington Challenger was 06/03/2020 (no recall for 2023 appointment).  Recall 01/29/2021 with Dr Caryl Comes   Copy of ICM check sent to Dr. Caryl Comes.   3 month ICM trend: 10/06/2020.    1 Year ICM trend:       [name], RN 10/06/2020 10:43 AM

## 2020-10-06 NOTE — Addendum Note (Signed)
Addended by: Antonieta Iba on: 10/06/2020 10:56 AM   Modules accepted: Orders

## 2020-10-06 NOTE — Telephone Encounter (Signed)
ICM call to patient.  Requested to send updated remote transmission today to review fluid levels and he agreed.

## 2020-10-11 DIAGNOSIS — Z20822 Contact with and (suspected) exposure to covid-19: Secondary | ICD-10-CM | POA: Diagnosis not present

## 2020-10-13 ENCOUNTER — Other Ambulatory Visit: Payer: Medicare Other | Admitting: *Deleted

## 2020-10-13 ENCOUNTER — Other Ambulatory Visit: Payer: Self-pay

## 2020-10-13 DIAGNOSIS — I5022 Chronic systolic (congestive) heart failure: Secondary | ICD-10-CM

## 2020-10-13 DIAGNOSIS — M25611 Stiffness of right shoulder, not elsewhere classified: Secondary | ICD-10-CM | POA: Diagnosis not present

## 2020-10-13 DIAGNOSIS — M25511 Pain in right shoulder: Secondary | ICD-10-CM | POA: Diagnosis not present

## 2020-10-13 DIAGNOSIS — M6281 Muscle weakness (generalized): Secondary | ICD-10-CM | POA: Diagnosis not present

## 2020-10-14 LAB — BASIC METABOLIC PANEL WITH GFR
BUN/Creatinine Ratio: 15 (ref 10–24)
BUN: 25 mg/dL (ref 8–27)
CO2: 22 mmol/L (ref 20–29)
Calcium: 9.3 mg/dL (ref 8.6–10.2)
Chloride: 105 mmol/L (ref 96–106)
Creatinine, Ser: 1.66 mg/dL — ABNORMAL HIGH (ref 0.76–1.27)
Glucose: 111 mg/dL — ABNORMAL HIGH (ref 65–99)
Potassium: 3.7 mmol/L (ref 3.5–5.2)
Sodium: 142 mmol/L (ref 134–144)
eGFR: 45 mL/min/1.73 — ABNORMAL LOW

## 2020-10-14 LAB — SPECIMEN STATUS REPORT

## 2020-10-18 ENCOUNTER — Ambulatory Visit (INDEPENDENT_AMBULATORY_CARE_PROVIDER_SITE_OTHER): Payer: Medicare Other

## 2020-10-18 ENCOUNTER — Ambulatory Visit (HOSPITAL_COMMUNITY)
Admission: RE | Admit: 2020-10-18 | Discharge: 2020-10-18 | Disposition: A | Discharge status: Home / Self Care | Payer: Medicare Other | Source: Ambulatory Visit | Attending: Internal Medicine | Admitting: Internal Medicine

## 2020-10-18 ENCOUNTER — Other Ambulatory Visit: Payer: Self-pay

## 2020-10-18 DIAGNOSIS — I428 Other cardiomyopathies: Secondary | ICD-10-CM

## 2020-10-18 DIAGNOSIS — C3490 Malignant neoplasm of unspecified part of unspecified bronchus or lung: Secondary | ICD-10-CM | POA: Insufficient documentation

## 2020-10-18 DIAGNOSIS — J439 Emphysema, unspecified: Secondary | ICD-10-CM | POA: Diagnosis not present

## 2020-10-18 DIAGNOSIS — I700 Atherosclerosis of aorta: Secondary | ICD-10-CM | POA: Diagnosis not present

## 2020-10-18 DIAGNOSIS — R911 Solitary pulmonary nodule: Secondary | ICD-10-CM | POA: Diagnosis not present

## 2020-10-18 DIAGNOSIS — J90 Pleural effusion, not elsewhere classified: Secondary | ICD-10-CM | POA: Diagnosis not present

## 2020-10-19 ENCOUNTER — Inpatient Hospital Stay: Payer: Medicare Other | Attending: Internal Medicine

## 2020-10-19 DIAGNOSIS — N289 Disorder of kidney and ureter, unspecified: Secondary | ICD-10-CM | POA: Diagnosis not present

## 2020-10-19 DIAGNOSIS — C3411 Malignant neoplasm of upper lobe, right bronchus or lung: Secondary | ICD-10-CM | POA: Insufficient documentation

## 2020-10-19 DIAGNOSIS — R5383 Other fatigue: Secondary | ICD-10-CM | POA: Diagnosis not present

## 2020-10-19 DIAGNOSIS — Z87891 Personal history of nicotine dependence: Secondary | ICD-10-CM | POA: Insufficient documentation

## 2020-10-19 DIAGNOSIS — C3490 Malignant neoplasm of unspecified part of unspecified bronchus or lung: Secondary | ICD-10-CM

## 2020-10-19 DIAGNOSIS — J449 Chronic obstructive pulmonary disease, unspecified: Secondary | ICD-10-CM | POA: Diagnosis not present

## 2020-10-19 LAB — CMP (CANCER CENTER ONLY)
ALT: 21 U/L (ref 0–44)
AST: 20 U/L (ref 15–41)
Albumin: 3.1 g/dL — ABNORMAL LOW (ref 3.5–5.0)
Alkaline Phosphatase: 143 U/L — ABNORMAL HIGH (ref 38–126)
Anion gap: 8 (ref 5–15)
BUN: 23 mg/dL (ref 8–23)
CO2: 25 mmol/L (ref 22–32)
Calcium: 9.3 mg/dL (ref 8.9–10.3)
Chloride: 109 mmol/L (ref 98–111)
Creatinine: 1.77 mg/dL — ABNORMAL HIGH (ref 0.61–1.24)
GFR, Estimated: 41 mL/min — ABNORMAL LOW (ref >60)
Glucose, Bld: 105 mg/dL — ABNORMAL HIGH (ref 70–99)
Potassium: 3.7 mmol/L (ref 3.5–5.1)
Sodium: 142 mmol/L (ref 135–145)
Total Bilirubin: 0.5 mg/dL (ref 0.3–1.2)
Total Protein: 7.2 g/dL (ref 6.5–8.1)

## 2020-10-19 LAB — CUP PACEART REMOTE DEVICE CHECK
Battery Remaining Longevity: 78 mo
Battery Remaining Percentage: 74 %
Battery Voltage: 2.99 V
Brady Statistic RV Percent Paced: 1 %
Date Time Interrogation Session: 20220718020019
HighPow Impedance: 36 Ohm
HighPow Impedance: 37 Ohm
Implantable Lead Implant Date: 20140703
Implantable Lead Location: 753860
Implantable Pulse Generator Implant Date: 20191113
Lead Channel Impedance Value: 360 Ohm
Lead Channel Pacing Threshold Amplitude: 0.5 V
Lead Channel Pacing Threshold Pulse Width: 0.5 ms
Lead Channel Sensing Intrinsic Amplitude: 3.2 mV
Lead Channel Setting Pacing Amplitude: 2.5 V
Lead Channel Setting Pacing Pulse Width: 0.5 ms
Lead Channel Setting Sensing Sensitivity: 0.5 mV
Pulse Gen Serial Number: 9820959

## 2020-10-19 LAB — CBC WITH DIFFERENTIAL (CANCER CENTER ONLY)
Abs Immature Granulocytes: 0.02 10*3/uL (ref 0.00–0.07)
Basophils Absolute: 0 10*3/uL (ref 0.0–0.1)
Basophils Relative: 0 %
Eosinophils Absolute: 0.2 10*3/uL (ref 0.0–0.5)
Eosinophils Relative: 3 %
HCT: 32.5 % — ABNORMAL LOW (ref 39.0–52.0)
Hemoglobin: 10.8 g/dL — ABNORMAL LOW (ref 13.0–17.0)
Immature Granulocytes: 0 %
Lymphocytes Relative: 9 %
Lymphs Abs: 0.5 10*3/uL — ABNORMAL LOW (ref 0.7–4.0)
MCH: 28.5 pg (ref 26.0–34.0)
MCHC: 33.2 g/dL (ref 30.0–36.0)
MCV: 85.8 fL (ref 80.0–100.0)
Monocytes Absolute: 1 10*3/uL (ref 0.1–1.0)
Monocytes Relative: 18 %
Neutro Abs: 3.6 10*3/uL (ref 1.7–7.7)
Neutrophils Relative %: 70 %
Platelet Count: 198 10*3/uL (ref 150–400)
RBC: 3.79 MIL/uL — ABNORMAL LOW (ref 4.22–5.81)
RDW: 14.6 % (ref 11.5–15.5)
WBC Count: 5.2 10*3/uL (ref 4.0–10.5)
nRBC: 0 % (ref 0.0–0.2)

## 2020-10-21 ENCOUNTER — Inpatient Hospital Stay (HOSPITAL_BASED_OUTPATIENT_CLINIC_OR_DEPARTMENT_OTHER): Payer: Medicare Other | Admitting: Internal Medicine

## 2020-10-21 ENCOUNTER — Other Ambulatory Visit: Payer: Self-pay

## 2020-10-21 VITALS — BP 129/66 | HR 87 | Temp 99.6°F | Resp 20 | Ht 73.0 in | Wt 286.6 lb

## 2020-10-21 DIAGNOSIS — C3411 Malignant neoplasm of upper lobe, right bronchus or lung: Secondary | ICD-10-CM | POA: Diagnosis not present

## 2020-10-21 DIAGNOSIS — R5383 Other fatigue: Secondary | ICD-10-CM | POA: Diagnosis not present

## 2020-10-21 DIAGNOSIS — J449 Chronic obstructive pulmonary disease, unspecified: Secondary | ICD-10-CM | POA: Diagnosis not present

## 2020-10-21 DIAGNOSIS — N289 Disorder of kidney and ureter, unspecified: Secondary | ICD-10-CM | POA: Diagnosis not present

## 2020-10-21 DIAGNOSIS — C3490 Malignant neoplasm of unspecified part of unspecified bronchus or lung: Secondary | ICD-10-CM

## 2020-10-21 DIAGNOSIS — M25511 Pain in right shoulder: Secondary | ICD-10-CM | POA: Diagnosis not present

## 2020-10-21 DIAGNOSIS — Z87891 Personal history of nicotine dependence: Secondary | ICD-10-CM | POA: Diagnosis not present

## 2020-10-21 NOTE — Progress Notes (Signed)
[name] Telephone:([phone]   Fax:([phone]) [phone]  OFFICE PROGRESS NOTE  Lucianne Lei, MD [address] [place] [address]  DIAGNOSIS: Stage IIIA (T1a, N2, M0) non-small cell lung cancer, adenocarcinoma presented with right upper lobe lung nodule in addition to mediastinal lymphadenopathy diagnosed in March 2018.  Biomarker Findings Tumor Mutational Burden - TMB-Intermediate (8 Muts/Mb) Microsatellite Status - MS-Stable Genomic Findings For a complete list of the genes assayed, please refer to the Appendix. ERBB2 amplification - equivocal? DNMT3A K463f*192 FUBP1 Q40* KEAP1 [id number] TP53 C275F 7 Disease relevant genes with no reportable alterations: EGFR, KRAS, ALK, BRAF, MET, RET, ROS1   PRIOR THERAPY:  1) Course of concurrent chemoradiation with weekly carboplatin for AUC of 2 and paclitaxel 45 MG/M2. Status post 6 cycles. Last cycle was given 10/16/2016. 2)  Consolidation treatment with immunotherapy with Imfinzi (Durvalumab) 10 MG/M2 every 2 weeks. First dose 12/21/2016.  Status post 26 cycles.  CURRENT THERAPY: Observation.  INTERVAL HISTORY: Jason Gargis89.o. male returns to the clinic today for follow-up visit.  The patient is feeling fine today with no concerning complaints.  He recently underwent a right shoulder replacement.  He denied having any current chest pain, shortness of breath, cough or hemoptysis.  He denied having any fever or chills.  He has no nausea, vomiting, diarrhea or constipation.  He has no headache or visual changes.  He has no weight loss or night sweats.  The patient had repeat CT scan of the chest performed recently and he is here for evaluation and discussion of his scan results.   MEDICAL HISTORY: Past Medical History:  Diagnosis Date   Adenocarcinoma of right lung, stage 3 ([facility]5/23/2018   Anemia    Arthritis    "hx right hip"   Asthma    "when I was a child"   Atrial fibrillation ([facility]    Amiodarone started 10/2011; Coumadin   Automatic implantable cardioverter-defibrillator in situ 10/03/2012   a. St. Jude ICD implantation 10/03/12.   Chronic anticoagulation    Chronic fatigue 10/09/77/8921 Chronic systolic heart failure ([facility]   a. Echo 7/13: EF 25%;  b. echo 04/2012:  Mild LVH, EF 30-35%, Gr 1 DD, mild AI, mild MR, mild LAE   COPD (chronic obstructive pulmonary disease) (HCC)    Dyslipidemia    Gout    History of blood transfusion 10/15/2013   "don't know where the blood's going; HgB down to 5"   Hyperlipidemia    Hypertension    Hypothyroidism    NICM (nonischemic cardiomyopathy) ([facility]   [facility]/14:  minimal CAD   Obesity    OSA on CPAP    Tobacco abuse     ALLERGIES:  has No Known Allergies.  MEDICATIONS:  Current Outpatient Medications  Medication Sig Dispense Refill   albuterol (VENTOLIN HFA) 108 (90 Base) MCG/ACT inhaler INHALE 2 PUFFS INTO THE LUNGS EVERY 4 HOURS AS NEEDED FOR WHEEZING OR SHORTNESS OF BREATH. 8.5 g 5   amiodarone (PACERONE) 200 MG tablet Take 1 tablet (200 mg total) by mouth daily. Except Saturday and Sunday (Patient taking differently: Take 200 mg by mouth See admin instructions. Take daily Except Saturday and Sunday) 90 tablet 1   amLODipine (NORVASC) 5 MG tablet Take 5 mg by mouth daily.     atorvastatin (LIPITOR) 40 MG tablet TAKE 1 TABLET BY MOUTH EVERY DAY 90 tablet 3   carvedilol (COREG) 12.5 MG tablet  TAKE 1 TABLET BY MOUTH TWICE A DAY (Patient taking differently: Take 12.5 mg by mouth 2 (two) times daily with a meal.) 180 tablet 3   [employer] D3 2000 units CAPS Take 2,000 Units by mouth daily.   11   ferrous gluconate (FERGON) 324 MG tablet Take 1 tablet (324 mg total) by mouth 3 (three) times daily with meals. 270 tablet 3   Fluticasone-Umeclidin-Vilant 100-62.5-25 MCG/INH AEPB TAKE 1 PUFF BY MOUTH EVERY DAY (Patient taking differently: Inhale 1 puff into the lungs daily. TAKE 1 PUFF BY MOUTH EVERY DAY) 180 each 11   furosemide (LASIX) 40 MG  tablet Take 1 tablet (40 mg) every other day, alternating with 1/2 tablet (20 mg) every other day. 90 tablet 3   gabapentin (NEURONTIN) 100 MG capsule Take 1 capsule (100 mg total) by mouth 3 (three) times daily. For pain 90 capsule 0   hydrALAZINE (APRESOLINE) 100 MG tablet TAKE 1 TABLET BY MOUTH THREE TIMES A DAY 270 tablet 0   levothyroxine (SYNTHROID) 50 MCG tablet TAKE 1 TABLET BY MOUTH DAILY BEFORE BREAKFAST (Patient taking differently: Take 50 mcg by mouth daily before breakfast.) 90 tablet 3   magnesium oxide (MAG-OX) 400 (241.3 Mg) MG tablet Take 1 tablet (400 mg total) by mouth daily. 30 tablet 0   methocarbamol (ROBAXIN) 500 MG tablet Take 1 tablet (500 mg total) by mouth every 8 (eight) hours as needed for muscle spasms. 20 tablet 0   Polyvinyl Alcohol-Povidone (REFRESH OP) Place 1 drop into both eyes every morning.     Potassium Chloride ER 20 MEQ TBCR TAKE 1 TABLET BY MOUTH EVERY DAY (Patient taking differently: Take 20 mEq by mouth daily.) 90 tablet 2   sacubitril-valsartan (ENTRESTO) 97-103 MG Take 1 tablet by mouth 2 (two) times daily. 180 tablet 3   sildenafil (VIAGRA) 100 MG tablet Take 1 tablet (100 mg total) by mouth daily as needed for erectile dysfunction. 10 tablet 1   spironolactone (ALDACTONE) 25 MG tablet Take 1 tablet (25 mg total) by mouth daily. 90 tablet 2   ULORIC 40 MG tablet Take 1 tablet (40 mg total) by mouth daily. 30 tablet 0   XARELTO 20 MG TABS tablet TAKE 1 TABLET BY MOUTH EVERY DAY (Patient taking differently: Take 20 mg by mouth daily with supper.) 90 tablet 3   No current facility-administered medications for this visit.    SURGICAL HISTORY:  Past Surgical History:  Procedure Laterality Date   CARDIAC DEFIBRILLATOR PLACEMENT  2014   CARDIOVERSION  2011   CARDIOVERSION N/A 03/15/2020   Procedure: CARDIOVERSION;  Surgeon: Fay Records, MD;  Location: [facility];  Service: Cardiovascular;  Laterality: N/A;   COLONOSCOPY WITH PROPOFOL Left 10/17/2013    Procedure: COLONOSCOPY WITH PROPOFOL;  Surgeon: Inda Castle, MD;  Location: [facility];  Service: Endoscopy;  Laterality: Left;   ESOPHAGOGASTRODUODENOSCOPY N/A 10/17/2013   Procedure: ESOPHAGOGASTRODUODENOSCOPY (EGD);  Surgeon: Inda Castle, MD;  Location: [facility];  Service: Endoscopy;  Laterality: N/A;   ESOPHAGOGASTRODUODENOSCOPY (EGD) WITH PROPOFOL N/A 06/14/2018   Procedure: ESOPHAGOGASTRODUODENOSCOPY (EGD) WITH PROPOFOL;  Surgeon: Doran Stabler, MD;  Location: [facility];  Service: Gastroenterology;  Laterality: N/A;   GIVENS CAPSULE STUDY N/A 10/29/2013   Procedure: GIVENS CAPSULE STUDY;  Surgeon: Inda Castle, MD;  Location: [facility];  Service: Endoscopy;  Laterality: N/A;   ICD GENERATOR CHANGEOUT N/A 02/13/2018   Procedure: ICD GENERATOR CHANGEOUT;  Surgeon: Constance Haw, MD;  Location: [facility];  Service: Cardiovascular;  Laterality: N/A;   IMPLANTABLE CARDIOVERTER DEFIBRILLATOR IMPLANT Left 10/03/2012   Procedure: IMPLANTABLE CARDIOVERTER DEFIBRILLATOR IMPLANT;  Surgeon: Deboraha Sprang, MD;  [facility];  Service: Cardiovascular;  Laterality: Left;   IR FLUORO GUIDE PORT INSERTION RIGHT  09/21/2016   IR US GUIDE VASC ACCESS RIGHT  09/21/2016   JOINT REPLACEMENT     REVERSE SHOULDER ARTHROPLASTY Right 09/22/2020   Procedure: SHOULDER HEMI ARTHROPLASTY;  Surgeon: Hiram Gash, MD;  Location: [facility];  Service: Orthopedics;  Laterality: Right;   SAVORY DILATION N/A 06/14/2018   Procedure: SAVORY DILATION;  Surgeon: Doran Stabler, MD;  Location: [facility];  Service: Gastroenterology;  Laterality: N/A;   TONSILLECTOMY  1950's   TOTAL HIP ARTHROPLASTY Right 11/25/1997   VIDEO BRONCHOSCOPY WITH ENDOBRONCHIAL ULTRASOUND N/A 08/09/2016   Procedure: VIDEO BRONCHOSCOPY WITH ENDOBRONCHIAL ULTRASOUND;  Surgeon: Javier Glazier, MD;  Location: [facility];  Service: Thoracic;  Laterality: N/A;    REVIEW OF SYSTEMS:  A comprehensive review of  systems was negative except for: Constitutional: positive for fatigue   PHYSICAL EXAMINATION: General appearance: alert, cooperative, and no distress Head: Normocephalic, without obvious abnormality, atraumatic Neck: no adenopathy, no JVD, supple, symmetrical, trachea midline, and thyroid not enlarged, symmetric, no tenderness/mass/nodules Lymph nodes: Cervical, supraclavicular, and axillary nodes normal. Resp: clear to auscultation bilaterally Back: symmetric, no curvature. ROM normal. No CVA tenderness. Cardio: regular rate and rhythm, S1, S2 normal, no murmur, click, rub or gallop GI: soft, non-tender; bowel sounds normal; no masses,  no organomegaly Extremities: extremities normal, atraumatic, no cyanosis or edema  ECOG PERFORMANCE STATUS: 1 - Symptomatic but completely ambulatory  Blood pressure 129/66, pulse 87, temperature 99.6 F (37.6 C), temperature source Tympanic, resp. rate 20, height '6\' 1"'  (1.854 m), weight 286 lb 9.6 oz (130 kg), SpO2 98 %.  LABORATORY DATA: Lab Results  Component Value Date   WBC 5.2 10/19/2020   HGB 10.8 (L) 10/19/2020   HCT 32.5 (L) 10/19/2020   MCV 85.8 10/19/2020   PLT 198 10/19/2020      Chemistry      Component Value Date/Time   NA 142 10/19/2020 1055   NA 142 10/13/2020 0000   NA 141 03/29/2017 1038   K 3.7 10/19/2020 1055   K 4.0 03/29/2017 1038   CL 109 10/19/2020 1055   CO2 25 10/19/2020 1055   CO2 27 03/29/2017 1038   BUN 23 10/19/2020 1055   BUN 25 10/13/2020 0000   BUN 28.6 (H) 03/29/2017 1038   CREATININE 1.77 (H) 10/19/2020 1055   CREATININE 1.6 (H) 03/29/2017 1038      Component Value Date/Time   CALCIUM 9.3 10/19/2020 1055   CALCIUM 9.3 03/29/2017 1038   ALKPHOS 143 (H) 10/19/2020 1055   ALKPHOS 105 03/29/2017 1038   AST 20 10/19/2020 1055   AST 18 03/29/2017 1038   ALT 21 10/19/2020 1055   ALT 13 03/29/2017 1038   BILITOT 0.5 10/19/2020 1055   BILITOT 0.62 03/29/2017 1038       RADIOGRAPHIC STUDIES: CT  Chest Wo Contrast  Result Date: 10/19/2020 CLINICAL DATA:  Primary Cancer Type: Lung Imaging Indication: Routine surveillance Interval therapy since last imaging? No Initial Cancer Diagnosis Date: 08/09/2016; Established by: Biopsy-proven Detailed Pathology: Stage IIIA non-small cell lung cancer, adenocarcinoma. Primary Tumor location:  Right upper lobe. Surgeries: Implantable cardiac defibrillator. Chemotherapy: Yes; Ongoing? No; Most recent administration: 10/16/2016 Immunotherapy?  Yes; Type: Imfinzi; Ongoing? No Radiation therapy? Yes; Date Range: 09/05/2016 -  10/20/2016; Target: Right lung EXAM: CT CHEST WITHOUT CONTRAST TECHNIQUE: Multidetector CT imaging of the chest was performed following the standard protocol without IV contrast. COMPARISON:  Most recent CT chest 04/15/2020.  07/31/2016 PET-CT. FINDINGS: Cardiovascular: Heart size is normal. There is no significant pericardial fluid, thickening or pericardial calcification. There is aortic atherosclerosis, as well as atherosclerosis of the great vessels of the mediastinum and the coronary arteries, including calcified atherosclerotic plaque in the left main and left circumflex coronary artery. Left-sided pacemaker device in place with lead tips terminating in the right ventricular apex. Right internal jugular single-lumen porta cath with tip terminating in the distal superior vena cava. Mediastinum/Nodes: No pathologically enlarged mediastinal or hilar lymph nodes. Please note that accurate exclusion of hilar adenopathy is limited on noncontrast CT scans. Lungs/Pleura: Tiny 3 mm nodule near the apex of the right upper lobe (axial image 30 of series 5), stable compared to prior examinations, favored to be benign. 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 No definite new suspicious  appearing pulmonary nodules or masses are noted on today's examination which is limited by considerable patient respiratory motion. Trace chronic right pleural effusion. No left pleural effusion. Mild diffuse bronchial wall thickening with very mild centrilobular and paraseptal emphysema. Upper Abdomen: Aortic atherosclerosis. There are two 6 mm nonobstructive calculi in the visualized portions of the right renal collecting system. Musculoskeletal: Orthopedic fixation hardware in the proximal right humerus incompletely imaged. There are no aggressive appearing lytic or blastic lesions noted in the visualized portions of the skeleton. IMPRESSION: 1. 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 2. Aortic atherosclerosis, in addition to left main and left circumflex coronary artery disease. Please note that although the presence of coronary artery calcium documents the presence of coronary artery disease, the severity of this disease and any potential stenosis cannot be assessed on this non-gated CT examination. Assessment for potential risk factor modification, dietary therapy or pharmacologic therapy may be warranted, if clinically indicated. 3. Mild diffuse bronchial wall thickening with very mild centrilobular and paraseptal emphysema; imaging findings that may suggest underlying COPD. 4. Nonobstructive calculi in the right renal collecting system. Electronically Signed   By: Vinnie Langton M.D.   On: 10/19/2020 10:59   DG Shoulder Right Port  Result Date: 09/22/2020 CLINICAL DATA:  70 year old male status post right shoulder arthroplasty. EXAM: PORTABLE RIGHT SHOULDER COMPARISON:  Right shoulder radiograph dated 06/05/2016. FINDINGS: Partially visualized right-sided Port-A-Cath. There is a right shoulder hemiarthroplasty. There is no acute fracture or dislocation. Mild  arthritic changes of the right AC joint. The soft tissues are unremarkable. IMPRESSION: Status post right shoulder hemiarthroplasty. No acute fracture or dislocation. Electronically Signed   By: Anner Crete M.D.   On: 09/22/2020 14:50   CUP PACEART REMOTE DEVICE CHECK  Result Date: 10/19/2020 Scheduled remote reviewed. Normal device function.  Next remote 91 days. LR    ASSESSMENT AND PLAN:  This is a very pleasant 71 years old African-American male with stage IIIA non-small cell lung cancer, adenocarcinoma.  The patient completed 6 weeks of concurrent chemoradiation with weekly carboplatin and paclitaxel and tolerated his treatment well except for odynophagia. The patient was then started on consolidation treatment with immunotherapy with Imfinzi (Durvalumab) status post 26 cycles.  The patient is currently on observation and he is feeling fine today with no concerning complaints except for mild fatigue. He had repeat CT scan of the chest performed recently.  I personally and independently reviewed the scans and discussed the results with the patient today. Has a scan showed no concerning findings for disease progression. I recommended for him to continue on observation with repeat CT scan of the chest in 6 months. Regarding the COPD, renal insufficiency and coronary artery disease, he will follow with his primary care physician. The patient was advised to call immediately if he has any concerning symptoms in the interval. The patient voices understanding of current disease status and treatment options and is in agreement with the current care plan. All questions were answered. The patient knows to call the clinic with any problems, questions or concerns. We can certainly see the patient much sooner if necessary. Disclaimer: This note was dictated with voice recognition software. Similar sounding words can inadvertently be transcribed and may not be corrected upon review.

## 2020-10-25 ENCOUNTER — Ambulatory Visit (INDEPENDENT_AMBULATORY_CARE_PROVIDER_SITE_OTHER): Payer: Medicare Other

## 2020-10-25 DIAGNOSIS — Z95810 Presence of automatic (implantable) cardiac defibrillator: Secondary | ICD-10-CM | POA: Diagnosis not present

## 2020-10-25 DIAGNOSIS — I5022 Chronic systolic (congestive) heart failure: Secondary | ICD-10-CM

## 2020-10-27 DIAGNOSIS — M6281 Muscle weakness (generalized): Secondary | ICD-10-CM | POA: Diagnosis not present

## 2020-10-27 DIAGNOSIS — M25511 Pain in right shoulder: Secondary | ICD-10-CM | POA: Diagnosis not present

## 2020-10-27 DIAGNOSIS — M25611 Stiffness of right shoulder, not elsewhere classified: Secondary | ICD-10-CM | POA: Diagnosis not present

## 2020-10-27 NOTE — Progress Notes (Signed)
EPIC Encounter for ICM Monitoring  Patient Name: Jason Russell is a 70 y.o. male Date: 10/27/2020 Primary Care Physican: Lucianne Lei, MD Primary Cardiologist: Harrington Challenger Electrophysiologist: Caryl Comes 10/21/2020 Office Weight: 281 lbs (not weighing at home)   Spoke with patient and heart failure questions reviewed.  Pt asymptomatic for fluid accumulation and feeling well.   CorVue Thoracic impedance suggesting normal fluid levels.   Prescribed:  Furosemide 40 mg take 0.5 tablet (20 mg total) by mouth evyer other day alternating with 1 tablet (40 mg total) every other day.  Potassium 20 mEq 1 tablet daily.   Labs: 10/19/2020 Creatinine 1.77, BUN 23, Potassium 3.7, Sodium 142 10/13/2020 Creatinine 1.66, BUN 25, Potassium 3.7, Sodium 142  09/23/2020 Creatinine 1.59, BUN 30, Potassium 4.1, Sodium 137, GFR 47  09/16/2020 Creatinine 1.87, BUN 39, Potassium 3.7, Sodium 138, GFR 39 06/21/2020 Creatinine 1.98, BUN 43, Potassium 4.1, Sodium 139, GFR 36 04/22/2020 Creatinine 2.12, BUN 44, Potassium 4.4, Sodium 142, GFR 33 A complete set of results can be found in results review   Recommendations:  No changes and encouraged to call if experiencing any fluid symptoms.   Follow-up plan: ICM clinic phone appointment on 11/29/2020.   91 day device clinic remote transmission 01/17/2021.     EP/Cardiology Office Visits:  Last OV with Dr Harrington Challenger was 06/03/2020 (no recall for 2023 appointment).  Recall 01/29/2021 with Dr Caryl Comes   Copy of ICM check sent to Dr. Caryl Comes.   3 month ICM trend: 10/25/2020.    1 Year ICM trend:       [name], RN 10/27/2020 8:04 AM

## 2020-11-03 DIAGNOSIS — M25611 Stiffness of right shoulder, not elsewhere classified: Secondary | ICD-10-CM | POA: Diagnosis not present

## 2020-11-03 DIAGNOSIS — M25511 Pain in right shoulder: Secondary | ICD-10-CM | POA: Diagnosis not present

## 2020-11-03 DIAGNOSIS — M6281 Muscle weakness (generalized): Secondary | ICD-10-CM | POA: Diagnosis not present

## 2020-11-10 DIAGNOSIS — M6281 Muscle weakness (generalized): Secondary | ICD-10-CM | POA: Diagnosis not present

## 2020-11-10 DIAGNOSIS — M25611 Stiffness of right shoulder, not elsewhere classified: Secondary | ICD-10-CM | POA: Diagnosis not present

## 2020-11-10 DIAGNOSIS — M25511 Pain in right shoulder: Secondary | ICD-10-CM | POA: Diagnosis not present

## 2020-11-10 NOTE — Progress Notes (Signed)
Remote ICD transmission.   

## 2020-11-17 DIAGNOSIS — M6281 Muscle weakness (generalized): Secondary | ICD-10-CM | POA: Diagnosis not present

## 2020-11-17 DIAGNOSIS — M25511 Pain in right shoulder: Secondary | ICD-10-CM | POA: Diagnosis not present

## 2020-11-17 DIAGNOSIS — M25611 Stiffness of right shoulder, not elsewhere classified: Secondary | ICD-10-CM | POA: Diagnosis not present

## 2020-11-24 DIAGNOSIS — M25611 Stiffness of right shoulder, not elsewhere classified: Secondary | ICD-10-CM | POA: Diagnosis not present

## 2020-11-24 DIAGNOSIS — M6281 Muscle weakness (generalized): Secondary | ICD-10-CM | POA: Diagnosis not present

## 2020-11-24 DIAGNOSIS — M25511 Pain in right shoulder: Secondary | ICD-10-CM | POA: Diagnosis not present

## 2020-11-29 ENCOUNTER — Ambulatory Visit (INDEPENDENT_AMBULATORY_CARE_PROVIDER_SITE_OTHER): Payer: Medicare Other

## 2020-11-29 DIAGNOSIS — Z95810 Presence of automatic (implantable) cardiac defibrillator: Secondary | ICD-10-CM | POA: Diagnosis not present

## 2020-11-29 DIAGNOSIS — I5022 Chronic systolic (congestive) heart failure: Secondary | ICD-10-CM

## 2020-12-01 DIAGNOSIS — M6281 Muscle weakness (generalized): Secondary | ICD-10-CM | POA: Diagnosis not present

## 2020-12-01 DIAGNOSIS — M25611 Stiffness of right shoulder, not elsewhere classified: Secondary | ICD-10-CM | POA: Diagnosis not present

## 2020-12-01 DIAGNOSIS — M25511 Pain in right shoulder: Secondary | ICD-10-CM | POA: Diagnosis not present

## 2020-12-01 NOTE — Progress Notes (Signed)
EPIC Encounter for ICM Monitoring  Patient Name: Jason Russell is a 70 y.o. male Date: 12/01/2020 Primary Care Physican: Lucianne Lei, MD Primary Cardiologist: Harrington Challenger Electrophysiologist: Caryl Comes 10/21/2020 Office Weight: 281 lbs (not weighing at home)   Spoke with patient and heart failure questions reviewed.  Pt asymptomatic for fluid accumulation and feeling well.   CorVue Thoracic impedance suggesting normal fluid levels.   Prescribed:  Furosemide 40 mg take 0.5 tablet (20 mg total) by mouth evyer other day alternating with 1 tablet (40 mg total) every other day.  Potassium 20 mEq 1 tablet daily.   Labs: 10/19/2020 Creatinine 1.77, BUN 23, Potassium 3.7, Sodium 142 10/13/2020 Creatinine 1.66, BUN 25, Potassium 3.7, Sodium 142  09/23/2020 Creatinine 1.59, BUN 30, Potassium 4.1, Sodium 137, GFR 47  09/16/2020 Creatinine 1.87, BUN 39, Potassium 3.7, Sodium 138, GFR 39 06/21/2020 Creatinine 1.98, BUN 43, Potassium 4.1, Sodium 139, GFR 36 04/22/2020 Creatinine 2.12, BUN 44, Potassium 4.4, Sodium 142, GFR 33 A complete set of results can be found in results review   Recommendations:  No changes and encouraged to call if experiencing any fluid symptoms.   Follow-up plan: ICM clinic phone appointment on 01/10/2021.   91 day device clinic remote transmission 01/17/2021.     EP/Cardiology Office Visits:  Last OV with Dr Harrington Challenger was 06/03/2020 (no recall for 2023 appointment).  Recall 01/29/2021 with Dr Caryl Comes   Copy of ICM check sent to Dr. Caryl Comes.    3 month ICM trend: 11/29/2020.    1 Year ICM trend:       [name], RN 12/01/2020 1:46 PM

## 2020-12-03 DIAGNOSIS — I10 Essential (primary) hypertension: Secondary | ICD-10-CM | POA: Diagnosis not present

## 2020-12-03 DIAGNOSIS — M02811 Other reactive arthropathies, right shoulder: Secondary | ICD-10-CM | POA: Diagnosis not present

## 2020-12-03 DIAGNOSIS — Z6838 Body mass index (BMI) 38.0-38.9, adult: Secondary | ICD-10-CM | POA: Diagnosis not present

## 2020-12-03 DIAGNOSIS — F4321 Adjustment disorder with depressed mood: Secondary | ICD-10-CM | POA: Diagnosis not present

## 2020-12-08 DIAGNOSIS — M25511 Pain in right shoulder: Secondary | ICD-10-CM | POA: Diagnosis not present

## 2020-12-08 DIAGNOSIS — M6281 Muscle weakness (generalized): Secondary | ICD-10-CM | POA: Diagnosis not present

## 2020-12-08 DIAGNOSIS — M25611 Stiffness of right shoulder, not elsewhere classified: Secondary | ICD-10-CM | POA: Diagnosis not present

## 2020-12-10 DIAGNOSIS — I129 Hypertensive chronic kidney disease with stage 1 through stage 4 chronic kidney disease, or unspecified chronic kidney disease: Secondary | ICD-10-CM | POA: Diagnosis not present

## 2020-12-10 DIAGNOSIS — R7989 Other specified abnormal findings of blood chemistry: Secondary | ICD-10-CM | POA: Diagnosis not present

## 2020-12-10 DIAGNOSIS — N183 Chronic kidney disease, stage 3 unspecified: Secondary | ICD-10-CM | POA: Diagnosis not present

## 2020-12-14 DIAGNOSIS — M9080 Osteopathy in diseases classified elsewhere, unspecified site: Secondary | ICD-10-CM | POA: Diagnosis not present

## 2020-12-14 DIAGNOSIS — N183 Chronic kidney disease, stage 3 unspecified: Secondary | ICD-10-CM | POA: Diagnosis not present

## 2020-12-14 DIAGNOSIS — D631 Anemia in chronic kidney disease: Secondary | ICD-10-CM | POA: Diagnosis not present

## 2020-12-14 DIAGNOSIS — N1832 Chronic kidney disease, stage 3b: Secondary | ICD-10-CM | POA: Diagnosis not present

## 2020-12-14 DIAGNOSIS — E889 Metabolic disorder, unspecified: Secondary | ICD-10-CM | POA: Diagnosis not present

## 2020-12-14 DIAGNOSIS — E559 Vitamin D deficiency, unspecified: Secondary | ICD-10-CM | POA: Diagnosis not present

## 2020-12-14 DIAGNOSIS — I129 Hypertensive chronic kidney disease with stage 1 through stage 4 chronic kidney disease, or unspecified chronic kidney disease: Secondary | ICD-10-CM | POA: Diagnosis not present

## 2020-12-15 DIAGNOSIS — M25511 Pain in right shoulder: Secondary | ICD-10-CM | POA: Diagnosis not present

## 2020-12-15 DIAGNOSIS — M25611 Stiffness of right shoulder, not elsewhere classified: Secondary | ICD-10-CM | POA: Diagnosis not present

## 2020-12-15 DIAGNOSIS — M6281 Muscle weakness (generalized): Secondary | ICD-10-CM | POA: Diagnosis not present

## 2020-12-22 DIAGNOSIS — M25511 Pain in right shoulder: Secondary | ICD-10-CM | POA: Diagnosis not present

## 2020-12-22 DIAGNOSIS — M25611 Stiffness of right shoulder, not elsewhere classified: Secondary | ICD-10-CM | POA: Diagnosis not present

## 2020-12-22 DIAGNOSIS — M6281 Muscle weakness (generalized): Secondary | ICD-10-CM | POA: Diagnosis not present

## 2020-12-25 ENCOUNTER — Other Ambulatory Visit: Payer: Self-pay | Admitting: Adult Health

## 2020-12-29 ENCOUNTER — Telehealth: Payer: Self-pay | Admitting: Adult Health

## 2020-12-29 DIAGNOSIS — M25511 Pain in right shoulder: Secondary | ICD-10-CM | POA: Diagnosis not present

## 2020-12-29 DIAGNOSIS — M6281 Muscle weakness (generalized): Secondary | ICD-10-CM | POA: Diagnosis not present

## 2020-12-29 DIAGNOSIS — M25611 Stiffness of right shoulder, not elsewhere classified: Secondary | ICD-10-CM | POA: Diagnosis not present

## 2020-12-29 NOTE — Telephone Encounter (Signed)
Called and spoke with Patient. Patient requested Trelegy 90 days supply to be sent to pharmacy.  Patient LOV was 11/2019.  Patient scheduled 01/03/21 at 1430 with Tammy, NP.  Nothing further at this time.

## 2021-01-03 ENCOUNTER — Other Ambulatory Visit: Payer: Self-pay

## 2021-01-03 ENCOUNTER — Encounter: Payer: Self-pay | Admitting: Adult Health

## 2021-01-03 ENCOUNTER — Ambulatory Visit (INDEPENDENT_AMBULATORY_CARE_PROVIDER_SITE_OTHER): Payer: Medicare Other | Admitting: Adult Health

## 2021-01-03 DIAGNOSIS — C3411 Malignant neoplasm of upper lobe, right bronchus or lung: Secondary | ICD-10-CM | POA: Diagnosis not present

## 2021-01-03 DIAGNOSIS — J430 Unilateral pulmonary emphysema [MacLeod's syndrome]: Secondary | ICD-10-CM | POA: Diagnosis not present

## 2021-01-03 DIAGNOSIS — G4733 Obstructive sleep apnea (adult) (pediatric): Secondary | ICD-10-CM

## 2021-01-03 MED ORDER — TRELEGY ELLIPTA 100-62.5-25 MCG/INH IN AEPB: 1.0000 | INHALATION_SPRAY | Freq: Every day | RESPIRATORY_TRACT | 3 refills | Status: DC

## 2021-01-03 NOTE — Assessment & Plan Note (Signed)
History of stage IIIa lung cancer.  Status post concurrent chemo and radiation in 2018.  Also immunotherapy course.  Patient is followed by oncology with serial CT.  Most recent CT chest October 18, 2020 showed no evidence of recurrence or metastatic disease.  Continue follow-up with oncology and serial CT follow-up

## 2021-01-03 NOTE — Assessment & Plan Note (Signed)
Appears stable.  Continue on triple therapy maintenance inhaler  Plan Patient Instructions  Continue on TRELEGY daily , rinse after use Activity as tolerated.   Follow up with Oncology as planned   Continue on CPAP at bedtime Do not drive if sleepy Work on [employer] .    Follow up with Dr. Lamonte Sakai or Tikia Skilton NP In 6 months and As needed

## 2021-01-03 NOTE — Progress Notes (Signed)
@Patient  ID: Jason Russell, male    DOB: 11-Nov-1950, 70 y.o.   MRN: [id number]  Chief Complaint  Patient presents with   Follow-up    Referring provider: Lucianne Lei, MD  HPI: 70 year old male followed for severe COPD, severe obstructive sleep apnea and a history of non-small cell lung cancer (stage IIIa non-small cell lung cancer, adenocarcinoma of the right upper lobe lung nodule in addition to mediastinal lymphadenopathy in April 2018 ,  status post XRT and chemo, and immunotherapy. Medical history significant for nonischemic heart disease, cardiomyopathy, CHF with a EF at 24 to 40%, atrial fibrillation status post ICD implantation and patient is on amiodarone  TEST/EVENTS :  07/31/16: FVC 1.84 L (41%) FEV1 1.14 L (42%) FEV1/FVC 0.77 FEF 25-75 1.17 L (38%) positive bronchodilator response TLC 5.76 L (75%) RV 140% ERV 48% DLCO uncorrected 52%     IMAGING PET CT 07/31/16 Markedly hypermetabolic posterior right upper lobe pulmonary lesion consistent with malignancy and hypermetabolic mediastinal lymph node metastases.   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 This obscures the regional right upper lobe apical lung nodule. No obvious nodular growth or recurrent thoracic adenopathy.   PATHOLOGY FNA RUL 09/06/16:  Adenocarcinoma  EBUS 4R & 7 08/09/16:  Adenocarcinoma    CT chest October 18, 2020 shows stable posttreatment changes of radiation fibrosis in the right hemothorax with no evidence to suggest local recurrence of disease or definite metastatic disease in the thorax.  Very mild emphysema.   01/03/2021 Follow up : Severe COPD, OSA, History of lung cancer Patient returns for a 1 year follow-up.  Patient has underlying obstructive sleep apnea.  Says he is doing well on CPAP.  Patient says he wears a CPAP every single night.  He never misses any nights.  Feels rested with no significant daytime sleepiness.  CPAP download shows excellent compliance  with 100% daily usage.  Daily average usage at 9 hours.  Patient is on auto CPAP 5 to 20 cm H2O.  AHI is 2.5.  Patient has underlying severe COPD.  He has maintained on Trelegy inhaler daily.  Patient says overall breathing is doing about the same.  He gets short of breath with heavy activities.  But does all of his daily house chores.  He was his wife's caregiver up until last year when she passed away.  He recently had right Shoulder surgery in June 2022 with a shoulder replacement.  He is undergoing physical therapy.  Feels that his symptoms he gets done with this he will start to be more active.  He has a history of stage IIIa non-small cell lung cancer followed by oncology.  Most recent CT chest October 18, 2020 showed stable posttreatment changes of radiation fibrosis.  No evidence to suggest local recurrence or definite metastatic disease. He has a follow-up CT chest in January 2023.   No Known Allergies  Immunization History  Administered Date(s) Administered   Influenza Split 10/27/2016   Influenza,inj,Quad PF,6+ Mos 12/08/2015   PFIZER Comirnaty(Gray Top)Covid-19 Tri-Sucrose Vaccine 06/02/2019, 01/03/2020   PFIZER(Purple Top)SARS-COV-2 Vaccination 05/10/2019, 06/02/2019   Pneumococcal Polysaccharide-23 10/16/2013   Pneumococcal-Unspecified 10/27/2016    Past Medical History:  Diagnosis Date   Adenocarcinoma of right lung, stage 3 ([place]) 08/23/2016   Anemia    Arthritis    "hx right hip"   Asthma    "when I was a child"   Atrial fibrillation ([place])    Amiodarone started 10/2011; Coumadin   Automatic implantable cardioverter-defibrillator in situ 10/03/2012  a. St. Jude ICD implantation 10/03/12.   Chronic anticoagulation    Chronic fatigue 06/29/9240   Chronic systolic heart failure ([place])    a. Echo 7/13: EF 25%;  b. echo 04/2012:  Mild LVH, EF 30-35%, Gr 1 DD, mild AI, mild MR, mild LAE   COPD (chronic obstructive pulmonary disease) (HCC)    Dyslipidemia    Gout    History of  blood transfusion 10/15/2013   "don't know where the blood's going; HgB down to 5"   Hyperlipidemia    Hypertension    Hypothyroidism    NICM (nonischemic cardiomyopathy) ([place])    [place] 4/14:  minimal CAD   Obesity    OSA on CPAP    Tobacco abuse     Tobacco History: Social History   Tobacco Use  Smoking Status Former   Packs/day: 0.25   Years: 45.00   Pack years: 11.25   Types: Cigarettes   Quit date: 07/11/2016   Years since quitting: 4.4  Smokeless Tobacco Never   Counseling given: Not Answered   Outpatient Medications Prior to Visit  Medication Sig Dispense Refill   albuterol (VENTOLIN HFA) 108 (90 Base) MCG/ACT inhaler INHALE 2 PUFFS INTO THE LUNGS EVERY 4 HOURS AS NEEDED FOR WHEEZING OR SHORTNESS OF BREATH. 8.5 g 5   amiodarone (PACERONE) 200 MG tablet Take 1 tablet (200 mg total) by mouth daily. Except Saturday and Sunday (Patient taking differently: Take 200 mg by mouth See admin instructions. Take daily Except Saturday and Sunday) 90 tablet 1   amLODipine (NORVASC) 5 MG tablet Take 5 mg by mouth daily.     atorvastatin (LIPITOR) 40 MG tablet TAKE 1 TABLET BY MOUTH EVERY DAY 90 tablet 3   carvedilol (COREG) 12.5 MG tablet TAKE 1 TABLET BY MOUTH TWICE A DAY (Patient taking differently: Take 12.5 mg by mouth 2 (two) times daily with a meal.) 180 tablet 3   [employer] D3 2000 units CAPS Take 2,000 Units by mouth daily.   11   ferrous gluconate (FERGON) 324 MG tablet Take 1 tablet (324 mg total) by mouth 3 (three) times daily with meals. 270 tablet 3   furosemide (LASIX) 40 MG tablet Take 1 tablet (40 mg) every other day, alternating with 1/2 tablet (20 mg) every other day. 90 tablet 3   gabapentin (NEURONTIN) 100 MG capsule Take 1 capsule (100 mg total) by mouth 3 (three) times daily. For pain 90 capsule 0   hydrALAZINE (APRESOLINE) 100 MG tablet TAKE 1 TABLET BY MOUTH THREE TIMES A DAY 270 tablet 0   levothyroxine (SYNTHROID) 50 MCG tablet TAKE 1 TABLET BY MOUTH DAILY BEFORE  BREAKFAST (Patient taking differently: Take 50 mcg by mouth daily before breakfast.) 90 tablet 3   magnesium oxide (MAG-OX) 400 (241.3 Mg) MG tablet Take 1 tablet (400 mg total) by mouth daily. 30 tablet 0   methocarbamol (ROBAXIN) 500 MG tablet Take 1 tablet (500 mg total) by mouth every 8 (eight) hours as needed for muscle spasms. 20 tablet 0   Polyvinyl Alcohol-Povidone (REFRESH OP) Place 1 drop into both eyes every morning.     Potassium Chloride ER 20 MEQ TBCR TAKE 1 TABLET BY MOUTH EVERY DAY (Patient taking differently: Take 20 mEq by mouth daily.) 90 tablet 2   sacubitril-valsartan (ENTRESTO) 97-103 MG Take 1 tablet by mouth 2 (two) times daily. 180 tablet 3   sildenafil (VIAGRA) 100 MG tablet Take 1 tablet (100 mg total) by mouth daily as needed for erectile dysfunction. 10  tablet 1   spironolactone (ALDACTONE) 25 MG tablet Take 1 tablet (25 mg total) by mouth daily. 90 tablet 2   ULORIC 40 MG tablet Take 1 tablet (40 mg total) by mouth daily. 30 tablet 0   XARELTO 20 MG TABS tablet TAKE 1 TABLET BY MOUTH EVERY DAY (Patient taking differently: Take 20 mg by mouth daily with supper.) 90 tablet 3   TRELEGY ELLIPTA 100-62.5-25 MCG/INH AEPB INHALE 1 PUFF BY MOUTH EVERY DAY 60 each 0   No facility-administered medications prior to visit.     Review of Systems:   Constitutional:   No  weight loss, night sweats,  Fevers, chills,  +fatigue, or  lassitude.  HEENT:   No headaches,  Difficulty swallowing,  Tooth/dental problems, or  Sore throat,                No sneezing, itching, ear ache, nasal congestion, post nasal drip,   CV:  No chest pain,  Orthopnea, PND, swelling in lower extremities, anasarca, dizziness, palpitations, syncope.   GI  No heartburn, indigestion, abdominal pain, nausea, vomiting, diarrhea, change in bowel habits, loss of appetite, bloody stools.   Resp: No shortness of breath with exertion or at rest.  No excess mucus, no productive cough,  No non-productive cough,   No coughing up of blood.  No change in color of mucus.  No wheezing.  No chest wall deformity  Skin: no rash or lesions.  GU: no dysuria, change in color of urine, no urgency or frequency.  No flank pain, no hematuria   MS:  recent  shoulder surgery    Physical Exam  BP 126/74 (BP Location: Left Arm, Patient Position: Sitting, Cuff Size: Large)   Pulse 84   Temp 98.2 F (36.8 C) (Oral)   Ht 6\' 1"  (1.854 m)   Wt 294 lb 6.4 oz (133.5 kg)   SpO2 98%   BMI 38.84 kg/m   GEN: A/Ox3; pleasant , NAD    HEENT:  [place]/AT,  , THROAT-clear, no lesions, no postnasal drip or exudate noted.   NECK:  Supple w/ fair ROM; no JVD; normal carotid impulses w/o bruits; no thyromegaly or nodules palpated; no lymphadenopathy.    RESP  Clear  P & A; w/o, wheezes/ rales/ or rhonchi. no accessory muscle use, no dullness to percussion  CARD:  RRR, no m/r/g, no peripheral edema, pulses intact, no cyanosis or clubbing.  GI:   Soft & nt; nml bowel sounds; no organomegaly or masses detected.   Musco: Warm bil, no deformities or joint swelling noted.   Neuro: alert, no focal deficits noted.    Skin: Warm, no lesions or rashes    Lab Results:     Imaging: No results found.    PFT Results Latest Ref Rng & Units 07/31/2016  FVC-Pre L 1.84  FVC-Predicted Pre % 41  FVC-Post L 2.32  FVC-Predicted Post % 52  Pre FEV1/FVC % % 77  Post FEV1/FCV % % 73  FEV1-Pre L 1.42  FEV1-Predicted Pre % 42  FEV1-Post L 1.69  DLCO uncorrected ml/min/mmHg 19.27  DLCO UNC% % 52  DLVA Predicted % 109  TLC L 5.76  TLC % Predicted % 75  RV % Predicted % 140    No results found for: NITRICOXIDE      Assessment & Plan:   COPD (chronic obstructive pulmonary disease) (HCC) Appears stable.  Continue on triple therapy maintenance inhaler  Plan Patient Instructions  Continue on TRELEGY daily , rinse  after use Activity as tolerated.   Follow up with Oncology as planned   Continue on CPAP at bedtime Do  not drive if sleepy Work on [employer] .    Follow up with Dr. Lamonte Sakai or [facility] NP In 6 months and As needed       Primary cancer of right upper lobe of lung ([place]) History of stage IIIa lung cancer.  Status post concurrent chemo and radiation in 2018.  Also immunotherapy course.  Patient is followed by oncology with serial CT.  Most recent CT chest October 18, 2020 showed no evidence of recurrence or metastatic disease.  Continue follow-up with oncology and serial CT follow-up  Obstructive sleep apnea Excellent control compliance on nocturnal CPAP     Rexene Edison, NP 01/03/2021

## 2021-01-03 NOTE — Patient Instructions (Addendum)
Continue on TRELEGY daily , rinse after use Activity as tolerated.   Follow up with Oncology as planned   Continue on CPAP at bedtime Do not drive if sleepy Work on [employer] .    Follow up with Dr. Lamonte Sakai or Darrion Wyszynski NP In 6 months and As needed

## 2021-01-03 NOTE — Assessment & Plan Note (Signed)
Excellent control compliance on nocturnal CPAP

## 2021-01-05 DIAGNOSIS — M25611 Stiffness of right shoulder, not elsewhere classified: Secondary | ICD-10-CM | POA: Diagnosis not present

## 2021-01-05 DIAGNOSIS — M6281 Muscle weakness (generalized): Secondary | ICD-10-CM | POA: Diagnosis not present

## 2021-01-05 DIAGNOSIS — M25511 Pain in right shoulder: Secondary | ICD-10-CM | POA: Diagnosis not present

## 2021-01-10 ENCOUNTER — Ambulatory Visit (INDEPENDENT_AMBULATORY_CARE_PROVIDER_SITE_OTHER): Payer: Medicare Other

## 2021-01-10 DIAGNOSIS — Z95810 Presence of automatic (implantable) cardiac defibrillator: Secondary | ICD-10-CM

## 2021-01-10 DIAGNOSIS — I5022 Chronic systolic (congestive) heart failure: Secondary | ICD-10-CM | POA: Diagnosis not present

## 2021-01-12 DIAGNOSIS — M25611 Stiffness of right shoulder, not elsewhere classified: Secondary | ICD-10-CM | POA: Diagnosis not present

## 2021-01-12 DIAGNOSIS — M25511 Pain in right shoulder: Secondary | ICD-10-CM | POA: Diagnosis not present

## 2021-01-12 DIAGNOSIS — M6281 Muscle weakness (generalized): Secondary | ICD-10-CM | POA: Diagnosis not present

## 2021-01-12 NOTE — Progress Notes (Signed)
EPIC Encounter for ICM Monitoring  Patient Name: Jason Russell is a 70 y.o. male Date: 01/12/2021 Primary Care Physican: Lucianne Lei, MD Primary Cardiologist: Harrington Challenger Electrophysiologist: Caryl Comes 01/03/2021 Office Weight: 294 lbs (not weighing at home)   Spoke with patient and heart failure questions reviewed.  Pt asymptomatic for fluid accumulation and feeling well.   CorVue Thoracic impedance suggesting normal fluid levels.   Prescribed:  Furosemide 40 mg take 0.5 tablet (20 mg total) by mouth evyer other day alternating with 1 tablet (40 mg total) every other day.  Potassium 20 mEq 1 tablet daily.   Labs: 12/10/2020 Creatinine 1.66, BUN 18, Potassium 3.7, Sodium 142, GFR 44 10/19/2020 Creatinine 1.77, BUN 23, Potassium 3.7, Sodium 142 10/13/2020 Creatinine 1.66, BUN 25, Potassium 3.7, Sodium 142  09/23/2020 Creatinine 1.59, BUN 30, Potassium 4.1, Sodium 137, GFR 47  09/16/2020 Creatinine 1.87, BUN 39, Potassium 3.7, Sodium 138, GFR 39 06/21/2020 Creatinine 1.98, BUN 43, Potassium 4.1, Sodium 139, GFR 36 04/22/2020 Creatinine 2.12, BUN 44, Potassium 4.4, Sodium 142, GFR 33 A complete set of results can be found in results review   Recommendations:  No changes and encouraged to call if experiencing any fluid symptoms.   Follow-up plan: ICM clinic phone appointment on 02/14/2021.   91 day device clinic remote transmission 01/17/2021.     EP/Cardiology Office Visits:  Last OV with Dr Harrington Challenger was 06/03/2020 (no recall for 2023 appointment).  Recall 01/29/2021 with Dr Caryl Comes   Copy of ICM check sent to Dr. Caryl Comes.    3 month ICM trend: 01/10/2021.    1 Year ICM trend:       [name], RN 01/12/2021 3:47 PM

## 2021-01-17 ENCOUNTER — Ambulatory Visit (INDEPENDENT_AMBULATORY_CARE_PROVIDER_SITE_OTHER): Payer: Medicare Other

## 2021-01-17 DIAGNOSIS — I428 Other cardiomyopathies: Secondary | ICD-10-CM | POA: Diagnosis not present

## 2021-01-18 LAB — CUP PACEART REMOTE DEVICE CHECK
Battery Remaining Longevity: 78 mo
Battery Remaining Longevity: 77 mo
Battery Remaining Percentage: 72 %
Battery Remaining Percentage: 72 %
Battery Voltage: 2.99 V
Battery Voltage: 2.99 V
Brady Statistic RV Percent Paced: 1 %
Brady Statistic RV Percent Paced: 1 %
Date Time Interrogation Session: 20221010022850
Date Time Interrogation Session: 20221017020017
HighPow Impedance: 41 Ohm
HighPow Impedance: 41 Ohm
HighPow Impedance: 36 Ohm
HighPow Impedance: 36 Ohm
Implantable Lead Implant Date: 20140703
Implantable Lead Implant Date: 20140703
Implantable Lead Location: 753860
Implantable Lead Location: 753860
Implantable Pulse Generator Implant Date: 20191113
Implantable Pulse Generator Implant Date: 20191113
Lead Channel Impedance Value: 400 Ohm
Lead Channel Impedance Value: 360 Ohm
Lead Channel Pacing Threshold Amplitude: 0.5 V
Lead Channel Pacing Threshold Amplitude: 0.5 V
Lead Channel Pacing Threshold Pulse Width: 0.5 ms
Lead Channel Pacing Threshold Pulse Width: 0.5 ms
Lead Channel Sensing Intrinsic Amplitude: 3.1 mV
Lead Channel Sensing Intrinsic Amplitude: 3.2 mV
Lead Channel Setting Pacing Amplitude: 2.5 V
Lead Channel Setting Pacing Amplitude: 2.5 V
Lead Channel Setting Pacing Pulse Width: 0.5 ms
Lead Channel Setting Pacing Pulse Width: 0.5 ms
Lead Channel Setting Sensing Sensitivity: 0.5 mV
Lead Channel Setting Sensing Sensitivity: 0.5 mV
Pulse Gen Serial Number: 9820959
Pulse Gen Serial Number: 9820959

## 2021-01-19 DIAGNOSIS — M6281 Muscle weakness (generalized): Secondary | ICD-10-CM | POA: Diagnosis not present

## 2021-01-19 DIAGNOSIS — M25611 Stiffness of right shoulder, not elsewhere classified: Secondary | ICD-10-CM | POA: Diagnosis not present

## 2021-01-19 DIAGNOSIS — M25511 Pain in right shoulder: Secondary | ICD-10-CM | POA: Diagnosis not present

## 2021-01-25 DIAGNOSIS — M25511 Pain in right shoulder: Secondary | ICD-10-CM | POA: Diagnosis not present

## 2021-01-25 DIAGNOSIS — M25611 Stiffness of right shoulder, not elsewhere classified: Secondary | ICD-10-CM | POA: Diagnosis not present

## 2021-01-25 DIAGNOSIS — M6281 Muscle weakness (generalized): Secondary | ICD-10-CM | POA: Diagnosis not present

## 2021-01-26 NOTE — Progress Notes (Signed)
Remote ICD transmission.   

## 2021-02-02 DIAGNOSIS — M25511 Pain in right shoulder: Secondary | ICD-10-CM | POA: Diagnosis not present

## 2021-02-02 DIAGNOSIS — M25611 Stiffness of right shoulder, not elsewhere classified: Secondary | ICD-10-CM | POA: Diagnosis not present

## 2021-02-02 DIAGNOSIS — M19011 Primary osteoarthritis, right shoulder: Secondary | ICD-10-CM | POA: Diagnosis not present

## 2021-02-02 DIAGNOSIS — Z471 Aftercare following joint replacement surgery: Secondary | ICD-10-CM | POA: Diagnosis not present

## 2021-02-02 DIAGNOSIS — M6281 Muscle weakness (generalized): Secondary | ICD-10-CM | POA: Diagnosis not present

## 2021-02-02 DIAGNOSIS — Z96611 Presence of right artificial shoulder joint: Secondary | ICD-10-CM | POA: Diagnosis not present

## 2021-02-09 DIAGNOSIS — M25511 Pain in right shoulder: Secondary | ICD-10-CM | POA: Diagnosis not present

## 2021-02-09 DIAGNOSIS — M6281 Muscle weakness (generalized): Secondary | ICD-10-CM | POA: Diagnosis not present

## 2021-02-09 DIAGNOSIS — Z471 Aftercare following joint replacement surgery: Secondary | ICD-10-CM | POA: Diagnosis not present

## 2021-02-09 DIAGNOSIS — M19011 Primary osteoarthritis, right shoulder: Secondary | ICD-10-CM | POA: Diagnosis not present

## 2021-02-09 DIAGNOSIS — M25611 Stiffness of right shoulder, not elsewhere classified: Secondary | ICD-10-CM | POA: Diagnosis not present

## 2021-02-09 DIAGNOSIS — Z96611 Presence of right artificial shoulder joint: Secondary | ICD-10-CM | POA: Diagnosis not present

## 2021-02-10 ENCOUNTER — Other Ambulatory Visit: Payer: Self-pay | Admitting: Internal Medicine

## 2021-02-14 ENCOUNTER — Ambulatory Visit (INDEPENDENT_AMBULATORY_CARE_PROVIDER_SITE_OTHER): Payer: Medicare Other

## 2021-02-14 DIAGNOSIS — I5022 Chronic systolic (congestive) heart failure: Secondary | ICD-10-CM

## 2021-02-14 DIAGNOSIS — Z96611 Presence of right artificial shoulder joint: Secondary | ICD-10-CM | POA: Diagnosis not present

## 2021-02-14 DIAGNOSIS — M19011 Primary osteoarthritis, right shoulder: Secondary | ICD-10-CM | POA: Diagnosis not present

## 2021-02-14 DIAGNOSIS — M25611 Stiffness of right shoulder, not elsewhere classified: Secondary | ICD-10-CM | POA: Diagnosis not present

## 2021-02-14 DIAGNOSIS — Z95810 Presence of automatic (implantable) cardiac defibrillator: Secondary | ICD-10-CM

## 2021-02-14 DIAGNOSIS — M25511 Pain in right shoulder: Secondary | ICD-10-CM | POA: Diagnosis not present

## 2021-02-14 DIAGNOSIS — Z471 Aftercare following joint replacement surgery: Secondary | ICD-10-CM | POA: Diagnosis not present

## 2021-02-14 DIAGNOSIS — M6281 Muscle weakness (generalized): Secondary | ICD-10-CM | POA: Diagnosis not present

## 2021-02-15 NOTE — Progress Notes (Signed)
EPIC Encounter for ICM Monitoring  Patient Name: Jason Russell is a 70 y.o. male Date: 02/15/2021 Primary Care Physican: Lucianne Lei, MD Primary Cardiologist: Harrington Challenger Electrophysiologist: Caryl Comes 01/03/2021 Office Weight: 294 lbs (not weighing at home)   Spoke with patient and heart failure questions reviewed.  Pt asymptomatic for fluid accumulation and feeling well.  Advised to drink 64 oz fluid daily to stay hydrated.  He has almost recovered from shoulder surgery and plans to get back to his hobby of fishing in December.   CorVue Thoracic impedance suggesting normal fluid levels.   Prescribed:  Furosemide 40 mg take 0.5 tablet (20 mg total) by mouth evyer other day alternating with 1 tablet (40 mg total) every other day.  Potassium 20 mEq 1 tablet daily.   Labs: 12/10/2020 Creatinine 1.66, BUN 18, Potassium 3.7, Sodium 142, GFR 44 10/19/2020 Creatinine 1.77, BUN 23, Potassium 3.7, Sodium 142 10/13/2020 Creatinine 1.66, BUN 25, Potassium 3.7, Sodium 142  09/23/2020 Creatinine 1.59, BUN 30, Potassium 4.1, Sodium 137, GFR 47  09/16/2020 Creatinine 1.87, BUN 39, Potassium 3.7, Sodium 138, GFR 39 06/21/2020 Creatinine 1.98, BUN 43, Potassium 4.1, Sodium 139, GFR 36 04/22/2020 Creatinine 2.12, BUN 44, Potassium 4.4, Sodium 142, GFR 33 A complete set of results can be found in results review   Recommendations:  No changes and encouraged to call if experiencing any fluid symptoms.   Follow-up plan: ICM clinic phone appointment on 03/21/2021.   91 day device clinic remote transmission 04/18/2021.     EP/Cardiology Office Visits:  Advised to call office to schedule 6 month f/u with Dr Harrington Challenger and due yearly for Dr Caryl Comes   Copy of ICM check sent to Dr. Caryl Comes.    3 month ICM trend: 02/14/2021.    12-14 Month ICM trend:       [name], RN 02/15/2021 12:50 PM

## 2021-02-22 DIAGNOSIS — Z20822 Contact with and (suspected) exposure to covid-19: Secondary | ICD-10-CM | POA: Diagnosis not present

## 2021-02-23 DIAGNOSIS — M25511 Pain in right shoulder: Secondary | ICD-10-CM | POA: Diagnosis not present

## 2021-02-23 DIAGNOSIS — M6281 Muscle weakness (generalized): Secondary | ICD-10-CM | POA: Diagnosis not present

## 2021-02-23 DIAGNOSIS — M25611 Stiffness of right shoulder, not elsewhere classified: Secondary | ICD-10-CM | POA: Diagnosis not present

## 2021-02-23 DIAGNOSIS — Z471 Aftercare following joint replacement surgery: Secondary | ICD-10-CM | POA: Diagnosis not present

## 2021-02-23 DIAGNOSIS — Z96611 Presence of right artificial shoulder joint: Secondary | ICD-10-CM | POA: Diagnosis not present

## 2021-02-23 DIAGNOSIS — M19011 Primary osteoarthritis, right shoulder: Secondary | ICD-10-CM | POA: Diagnosis not present

## 2021-03-04 DIAGNOSIS — I10 Essential (primary) hypertension: Secondary | ICD-10-CM | POA: Diagnosis not present

## 2021-03-04 DIAGNOSIS — Z6838 Body mass index (BMI) 38.0-38.9, adult: Secondary | ICD-10-CM | POA: Diagnosis not present

## 2021-03-04 DIAGNOSIS — N189 Chronic kidney disease, unspecified: Secondary | ICD-10-CM | POA: Diagnosis not present

## 2021-03-04 DIAGNOSIS — C3490 Malignant neoplasm of unspecified part of unspecified bronchus or lung: Secondary | ICD-10-CM | POA: Diagnosis not present

## 2021-03-04 DIAGNOSIS — E039 Hypothyroidism, unspecified: Secondary | ICD-10-CM | POA: Diagnosis not present

## 2021-03-19 ENCOUNTER — Other Ambulatory Visit: Payer: Self-pay | Admitting: Internal Medicine

## 2021-03-21 ENCOUNTER — Ambulatory Visit (INDEPENDENT_AMBULATORY_CARE_PROVIDER_SITE_OTHER): Payer: Medicare Other

## 2021-03-21 DIAGNOSIS — I5022 Chronic systolic (congestive) heart failure: Secondary | ICD-10-CM | POA: Diagnosis not present

## 2021-03-21 DIAGNOSIS — Z95810 Presence of automatic (implantable) cardiac defibrillator: Secondary | ICD-10-CM | POA: Diagnosis not present

## 2021-03-22 ENCOUNTER — Telehealth: Payer: Self-pay

## 2021-03-22 NOTE — Progress Notes (Signed)
EPIC Encounter for ICM Monitoring  Patient Name: Jason Russell is a 70 y.o. male Date: 03/22/2021 Primary Care Physican: Lucianne Lei, MD Primary Cardiologist: Harrington Challenger Electrophysiologist: Caryl Comes 01/03/2021 Office Weight: 294 lbs (not weighing at home)   Attempted call to patient and unable to reach.  Left detailed message per DPR regarding transmission. Transmission reviewed.    CorVue Thoracic impedance suggesting possible fluid accumulation from 12/3-12/17 and trending toward dryness 12/18   Prescribed:  Furosemide 40 mg take 0.5 tablet (20 mg total) by mouth evyer other day alternating with 1 tablet (40 mg total) every other day.  Potassium 20 mEq 1 tablet daily.   Labs: 12/10/2020 Creatinine 1.66, BUN 18, Potassium 3.7, Sodium 142, GFR 44 10/19/2020 Creatinine 1.77, BUN 23, Potassium 3.7, Sodium 142 10/13/2020 Creatinine 1.66, BUN 25, Potassium 3.7, Sodium 142  09/23/2020 Creatinine 1.59, BUN 30, Potassium 4.1, Sodium 137, GFR 47  09/16/2020 Creatinine 1.87, BUN 39, Potassium 3.7, Sodium 138, GFR 39 06/21/2020 Creatinine 1.98, BUN 43, Potassium 4.1, Sodium 139, GFR 36 04/22/2020 Creatinine 2.12, BUN 44, Potassium 4.4, Sodium 142, GFR 33 A complete set of results can be found in results review   Recommendations:  Left voice mail with ICM number and encouraged to call if experiencing any fluid symptoms.   Follow-up plan: ICM clinic phone appointment on 04/25/2021.   91 day device clinic remote transmission 04/18/2021.     EP/Cardiology Office Visits:  Needs to schedule 6 month f/u with Dr Harrington Challenger and due yearly for Dr Caryl Comes   Copy of ICM check sent to Dr. Caryl Comes.    3 month ICM trend: 03/21/2021.    12-14 Month ICM trend:       [name], RN 03/22/2021 3:23 PM

## 2021-03-22 NOTE — Telephone Encounter (Signed)
Remote ICM transmission received.  Attempted call to patient regarding ICM remote transmission and left detailed message per DPR.  Advised to return call for any fluid symptoms or questions. Next ICM remote transmission scheduled 04/25/2021.

## 2021-03-24 ENCOUNTER — Other Ambulatory Visit: Payer: Self-pay | Admitting: Internal Medicine

## 2021-03-24 DIAGNOSIS — Z20822 Contact with and (suspected) exposure to covid-19: Secondary | ICD-10-CM | POA: Diagnosis not present

## 2021-03-24 NOTE — Telephone Encounter (Signed)
OK to fill

## 2021-03-30 ENCOUNTER — Other Ambulatory Visit: Payer: Self-pay | Admitting: Internal Medicine

## 2021-04-10 NOTE — Progress Notes (Signed)
Cardiology Office Note Date:  04/12/2021  Patient ID:  Jason Russell, DOB Jan 07, 1951, MRN [id number] PCP:  Lucianne Lei, MD  Cardiologist:  Dr. Harrington Challenger Electrophysiologist: Dr. Caryl Comes Nephrology: Dr. Olivia Mackie [facility])    Chief Complaint:   6 mo  History of Present Illness: Jason Russell is a 71 y.o. 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   He was hospitalized 10/8-10/9/18 with rapid unintentional weight loss and ICD therapy.  He reported that his wife had fallen and he was helping her up when he was shocked by his device.  No reports of symptoms associated with it.  The patient was evaluated and found to be hypokalemic and low mag as well and felt this contributed to his arhythmia, by interrogation was VT.  He also reported that he had completed his chemo and radiation was doing OK when they started immunotherapy after his first dose, became weak, anorexic and reported 20lb weight loss in 2 weeks.  The patient's electrolytes were replaced his coreg was increased and discharged home.  He comes in today to be seen for Dr. Caryl Comes, last seen by him Aug 2020.  He mentions RV pacing threshold chronically low (?), , RBBB.  Discussed no AFib and his amiodarone was reduced with plans for further reduction (to 700g/week) at his next annual visit if none again.  More recently saw Dr. Harrington Challenger Aug 2021, was doing OK, entresto was titrated up.  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 He denies any difficulties with his ADLs, no SOB, DOE. No CP, palpitations or cardiac awareness. No dizzy spells, near syncope or syncope. He reports compliacne with his medicines, discussed his xarelto specifically, says he never misses. No bleeding or signs of bleeding. He was in rate controlled AFib, not overly symptomatic, perhaps with  vague sense of reduced energy, his amiodarone increased to 200mg  daily (from 100mg  daily) and planned for DCCV.  DCCV 03/15/20 was succesful.  I saw him 04/26/20 He is doing well.  Does not think he can tell much if any improvement post DCCV. He denies any CP, palpitations or cardiac awareness No dizzy spells, near syncope or syncope. NO bleeding or signs of bleeding He saw his oncologist who did labs and has been referred to a nephrologist, Dr. Thereasa Distance but has not gotten an appointment yet. He does not weigh daily, does not think he has gained, does not feel bloated or swollen  HF meds were deferred to Dr. Harrington Challenger with recent increase in Creat and pending nephrology consult.  Planned to further down titrate his amio in a couple months, given he felt the same in SR as he did in AF, ? Stop eventually.  He saw Dr. Harrington Challenger 3/3/222, no changes were made, planned/discussed weight loss strategies.   I saw him 08/02/20 He is doing well. He does think after all that he probably feel better in SR, does not think he has had Afib since his DCCV in Dec. No CP, palpitations or cardiac awareness No SOB, denies DOE Uses CPAP at night and denies symptoms of orthopnea or PND. No dizzy spells, near syncope or syncope. His amiodarone reduced to Monday-Friday, none on weekends Labs were UTD Planned for 6 mo visit  TODAY He is doing quite well Does not exercise but active, likes to fish, busy  uin the yard, around the house, denies SOB or exertional intolerances. No CP He does not think he has had any Afib, again confirms that he does feel better in SR then in AF No syncope, no shocks No bleeding or signs of bleeding  Device information: SJM single chamber ICD, implanted 10/03/12 > gen change Nov 2019 for early gen depletion + hx of appropriate tx Oct 2018 for VT AAD Tx:  Amiodarone (goes as far back as his Epic chart (2013) seems for his AF   Past Medical History:  Diagnosis Date    Adenocarcinoma of right lung, stage 3 ([place]) 08/23/2016   Anemia    Arthritis    "hx right hip"   Asthma    "when I was a child"   Atrial fibrillation ([place])    Amiodarone started 10/2011; Coumadin   Automatic implantable cardioverter-defibrillator in situ 10/03/2012   a. St. Jude ICD implantation 10/03/12.   Chronic anticoagulation    Chronic fatigue 11/14/4816   Chronic systolic heart failure ([place])    a. Echo 7/13: EF 25%;  b. echo 04/2012:  Mild LVH, EF 30-35%, Gr 1 DD, mild AI, mild MR, mild LAE   COPD (chronic obstructive pulmonary disease) (HCC)    Dyslipidemia    Gout    History of blood transfusion 10/15/2013   "don't know where the blood's going; HgB down to 5"   Hyperlipidemia    Hypertension    Hypothyroidism    NICM (nonischemic cardiomyopathy) ([place])    [address] 4/14:  minimal CAD   Obesity    OSA on CPAP    Tobacco abuse     Past Surgical History:  Procedure Laterality Date   CARDIAC DEFIBRILLATOR PLACEMENT  2014   CARDIOVERSION  2011   CARDIOVERSION N/A 03/15/2020   Procedure: CARDIOVERSION;  Surgeon: Fay Records, MD;  Location: [place];  Service: Cardiovascular;  Laterality: N/A;   COLONOSCOPY WITH PROPOFOL Left 10/17/2013   Procedure: COLONOSCOPY WITH PROPOFOL;  Surgeon: Inda Castle, MD;  Location: [facility] ENDOSCOPY;  Service: Endoscopy;  Laterality: Left;   ESOPHAGOGASTRODUODENOSCOPY N/A 10/17/2013   Procedure: ESOPHAGOGASTRODUODENOSCOPY (EGD);  Surgeon: Inda Castle, MD;  Location: [place];  Service: Endoscopy;  Laterality: N/A;   ESOPHAGOGASTRODUODENOSCOPY (EGD) WITH PROPOFOL N/A 06/14/2018   Procedure: ESOPHAGOGASTRODUODENOSCOPY (EGD) WITH PROPOFOL;  Surgeon: Doran Stabler, MD;  Location: [facility] ENDOSCOPY;  Service: Gastroenterology;  Laterality: N/A;   GIVENS CAPSULE STUDY N/A 10/29/2013   Procedure: GIVENS CAPSULE STUDY;  Surgeon: Inda Castle, MD;  Location: [facility] ENDOSCOPY;  Service: Endoscopy;  Laterality: N/A;   ICD GENERATOR CHANGEOUT N/A 02/13/2018    Procedure: ICD GENERATOR CHANGEOUT;  Surgeon: Constance Haw, MD;  Location: [place] CV LAB;  Service: Cardiovascular;  Laterality: N/A;   IMPLANTABLE CARDIOVERTER DEFIBRILLATOR IMPLANT Left 10/03/2012   Procedure: IMPLANTABLE CARDIOVERTER DEFIBRILLATOR IMPLANT;  Surgeon: Deboraha Sprang, MD;  Location: [facility] CATH LAB;  Service: Cardiovascular;  Laterality: Left;   IR FLUORO GUIDE PORT INSERTION RIGHT  09/21/2016   IR US GUIDE VASC ACCESS RIGHT  09/21/2016   JOINT REPLACEMENT     REVERSE SHOULDER ARTHROPLASTY Right 09/22/2020   Procedure: SHOULDER HEMI ARTHROPLASTY;  Surgeon: Hiram Gash, MD;  Location: [facility] ORS;  Service: Orthopedics;  Laterality: Right;   SAVORY DILATION N/A 06/14/2018   Procedure: SAVORY DILATION;  Surgeon: Doran Stabler, MD;  Location: [facility] ENDOSCOPY;  Service: Gastroenterology;  Laterality: N/A;   TONSILLECTOMY  1950's   TOTAL HIP ARTHROPLASTY Right  11/25/1997   VIDEO BRONCHOSCOPY WITH ENDOBRONCHIAL ULTRASOUND N/A 08/09/2016   Procedure: VIDEO BRONCHOSCOPY WITH ENDOBRONCHIAL ULTRASOUND;  Surgeon: Javier Glazier, MD;  Location: [facility] OR;  Service: Thoracic;  Laterality: N/A;    Current Outpatient Medications  Medication Sig Dispense Refill   albuterol (VENTOLIN HFA) 108 (90 Base) MCG/ACT inhaler INHALE 2 PUFFS INTO THE LUNGS EVERY 4 HOURS AS NEEDED FOR WHEEZING OR SHORTNESS OF BREATH. 8.5 g 5   amLODipine (NORVASC) 5 MG tablet Take 5 mg by mouth daily.     atorvastatin (LIPITOR) 40 MG tablet TAKE 1 TABLET BY MOUTH EVERY DAY 90 tablet 3   carvedilol (COREG) 12.5 MG tablet TAKE 1 TABLET BY MOUTH TWICE A DAY (Patient taking differently: Take 12.5 mg by mouth 2 (two) times daily with a meal.) 180 tablet 3   [employer] D3 2000 units CAPS Take 2,000 Units by mouth daily.   11   ferrous gluconate (FERGON) 324 MG tablet TAKE 1 TABLET (324 MG TOTAL) BY MOUTH 3 (THREE) TIMES DAILY WITH MEALS 270 tablet 1   Fluticasone-Umeclidin-Vilant (TRELEGY ELLIPTA) 100-62.5-25 MCG/INH AEPB Inhale  1 puff into the lungs daily. 3 each 3   furosemide (LASIX) 40 MG tablet Take 1 tablet (40 mg) every other day, alternating with 1/2 tablet (20 mg) every other day. 90 tablet 3   gabapentin (NEURONTIN) 100 MG capsule Take 1 capsule (100 mg total) by mouth 3 (three) times daily. For pain 90 capsule 0   hydrALAZINE (APRESOLINE) 100 MG tablet TAKE 1 TABLET BY MOUTH THREE TIMES A DAY 270 tablet 1   levothyroxine (SYNTHROID) 50 MCG tablet TAKE 1 TABLET BY MOUTH EVERY DAY BEFORE BREAKFAST 90 tablet 3   magnesium oxide (MAG-OX) 400 (241.3 Mg) MG tablet Take 1 tablet (400 mg total) by mouth daily. 30 tablet 0   methocarbamol (ROBAXIN) 500 MG tablet Take 1 tablet (500 mg total) by mouth every 8 (eight) hours as needed for muscle spasms. 20 tablet 0   Polyvinyl Alcohol-Povidone (REFRESH OP) Place 1 drop into both eyes every morning.     Potassium Chloride ER 20 MEQ TBCR TAKE 1 TABLET BY MOUTH EVERY DAY 90 tablet 1   sacubitril-valsartan (ENTRESTO) 97-103 MG Take 1 tablet by mouth 2 (two) times daily. 180 tablet 3   sildenafil (VIAGRA) 100 MG tablet Take 1 tablet (100 mg total) by mouth daily as needed for erectile dysfunction. 10 tablet 1   spironolactone (ALDACTONE) 25 MG tablet Take 1 tablet (25 mg total) by mouth daily. 90 tablet 2   ULORIC 40 MG tablet Take 1 tablet (40 mg total) by mouth daily. 30 tablet 0   XARELTO 20 MG TABS tablet TAKE 1 TABLET BY MOUTH EVERY DAY (Patient taking differently: Take 20 mg by mouth daily with supper.) 90 tablet 3   amiodarone (PACERONE) 200 MG tablet Take 0.5 tablets (100 mg total) by mouth See admin instructions. Take daily Except Saturday and Sunday 45 tablet 3   No current facility-administered medications for this visit.    Allergies:   Patient has no known allergies.   Social History:  The patient  reports that he quit smoking about 4 years ago. His smoking use included cigarettes. He has a 11.25 pack-year smoking history. He has never used smokeless tobacco. He  reports current alcohol use of about 1.0 standard drink per week. He reports that he does not use drugs.   Family History:  The patient's family history includes Heart Problems in his mother; Hypertension in his  mother; Lung cancer in his brother; Other in his father.  ROS:  Please see the history of present illness.  All other systems are reviewed and otherwise negative.   PHYSICAL EXAM:  VS:  BP 108/68    Pulse 88    Ht 6' (1.829 m)    Wt 289 lb (131.1 kg)    SpO2 97%    BMI 39.20 kg/m  BMI: Body mass index is 39.2 kg/m. Well nourished, well developed, in no acute distress  HEENT: normocephalic, atraumatic  Neck: no JVD, carotid bruits or masses Cardiac: RRR; no significant murmurs, no rubs, or gallops Lungs:   CTA b/l, no wheezing, rhonchi or rales  Abd: soft, non-tender, obese MS: no deformity or atrophy Ext:  trace edema, wearing support stockings  Skin: warm and dry, no rash Neuro:  No gross deficits appreciated Psych: euthymic mood, full affect  ICD site is stable, no tethering or discomfort   EKG:   done today and reviewed by myself SR, 75bpm, RBBB, LAD PAC,. PVC  ICD interrogation done today and reviewed by myself: Battery and lead measurements are good No VT + Magnet response on 09/22/20, he reports shoulder surgery that day  05/24/2018: TTE IMPRESSIONS  1. The left ventricle has moderately reduced systolic function, with an  ejection fraction of 35-40%. The cavity size was normal. There is mildly  increased left ventricular wall thickness. Left ventricular diastolic  Doppler parameters are consistent with   impaired relaxation.   2. The right ventricle has mildly reduced systolic function. The cavity  was small. There is no increase in right ventricular wall thickness.   3. The mitral valve is normal in structure. Mild thickening of the mitral  valve leaflet.   4. The tricuspid valve is normal in structure.   5. The aortic valve is normal in structure. Aortic  valve regurgitation is  mild to moderate by color flow Doppler.   6. The interatrial septum was not assessed.   12/28/14: TTE Study Conclusions - Left ventricle: The cavity size was moderately dilated. There was   mild concentric hypertrophy. Systolic function was moderately to   severely reduced. The estimated ejection fraction was in the   range of 30% to 35%. Moderate diffuse hypokinesis with no   identifiable regional variations. Features are consistent with a   pseudonormal left ventricular filling pattern, with concomitant   abnormal relaxation and increased filling pressure (grade 2   diastolic dysfunction). - Aortic valve: There was mild to moderate regurgitation. - Left atrium: The atrium was moderately dilated. - Right ventricle: The cavity size was mildly dilated. Wall   thickness was normal. - Atrial septum: There was an atrial septal aneurysm. - Pulmonary arteries: Systolic pressure was mildly increased. PA   peak pressure: 37 mm Hg (S).   Recent Labs: 10/19/2020: ALT 21; BUN 23; Creatinine 1.77; Hemoglobin 10.8; Platelet Count 198; Potassium 3.7; Sodium 142  No results found for requested labs within last 8760 hours.   CrCl cannot be calculated (Patient's most recent lab result is older than the maximum 21 days allowed.).   Wt Readings from Last 3 Encounters:  04/12/21 289 lb (131.1 kg)  01/03/21 294 lb 6.4 oz (133.5 kg)  10/21/20 286 lb 9.6 oz (130 kg)     Other studies reviewed: Additional studies/records reviewed today include: summarized above  ASSESSMENT AND PLAN:  1. ICD     Intact function, no programming changes made  2. VT     None noted by  today's device interrogation  3. NICM     Chronic CHF (systolic)     LVEF 74-14% by last echo     On good meds     No symptoms of volume OL     CorVue looks OK       4. HTN     Looks OK, no changes  5. Paroxysmal Afib     CHA2DS2Vasc is at least 3, on xarelto, appropriately dosed      Will further  reduce his amiodarone to 100mg  M-F     Should he have any AF he will let us know      Amio labs today        Disposition: He has an appt to see Dr. Harrington Challenger in April, will see him back in 4 mo or so on lowered amio dose, sooner if needed   Current medicines are reviewed at length with the patient today.  The patient did not have any concerns regarding medicines.  Venetia Night, PA-C 04/12/2021 4:26 PM     [address] [place] [address] [phone] (office)  [phone] (fax)

## 2021-04-12 ENCOUNTER — Other Ambulatory Visit: Payer: Self-pay

## 2021-04-12 ENCOUNTER — Ambulatory Visit (INDEPENDENT_AMBULATORY_CARE_PROVIDER_SITE_OTHER): Payer: Medicare Other | Admitting: Physician Assistant

## 2021-04-12 ENCOUNTER — Encounter: Payer: Self-pay | Admitting: Physician Assistant

## 2021-04-12 VITALS — BP 108/68 | HR 88 | Ht 72.0 in | Wt 289.0 lb

## 2021-04-12 DIAGNOSIS — Z79899 Other long term (current) drug therapy: Secondary | ICD-10-CM | POA: Diagnosis not present

## 2021-04-12 DIAGNOSIS — Z95810 Presence of automatic (implantable) cardiac defibrillator: Secondary | ICD-10-CM | POA: Diagnosis not present

## 2021-04-12 DIAGNOSIS — I5022 Chronic systolic (congestive) heart failure: Secondary | ICD-10-CM | POA: Diagnosis not present

## 2021-04-12 DIAGNOSIS — I472 Ventricular tachycardia, unspecified: Secondary | ICD-10-CM | POA: Diagnosis not present

## 2021-04-12 DIAGNOSIS — I480 Paroxysmal atrial fibrillation: Secondary | ICD-10-CM | POA: Diagnosis not present

## 2021-04-12 DIAGNOSIS — I428 Other cardiomyopathies: Secondary | ICD-10-CM

## 2021-04-12 DIAGNOSIS — I10 Essential (primary) hypertension: Secondary | ICD-10-CM | POA: Diagnosis not present

## 2021-04-12 LAB — CUP PACEART INCLINIC DEVICE CHECK
Battery Remaining Longevity: 78 mo
Brady Statistic RV Percent Paced: 0.17 %
Date Time Interrogation Session: 20230110171324
HighPow Impedance: 39.089
Implantable Lead Implant Date: 20140703
Implantable Lead Location: 753860
Implantable Pulse Generator Implant Date: 20191113
Lead Channel Impedance Value: 375 Ohm
Lead Channel Pacing Threshold Amplitude: 0.75 V
Lead Channel Pacing Threshold Amplitude: 0.75 V
Lead Channel Pacing Threshold Pulse Width: 0.5 ms
Lead Channel Pacing Threshold Pulse Width: 0.5 ms
Lead Channel Sensing Intrinsic Amplitude: 3.3 mV
Lead Channel Setting Pacing Amplitude: 2.5 V
Lead Channel Setting Pacing Pulse Width: 0.5 ms
Lead Channel Setting Sensing Sensitivity: 0.5 mV
Pulse Gen Serial Number: 9820959

## 2021-04-12 MED ORDER — AMIODARONE HCL 200 MG PO TABS: 100.0000 mg | ORAL_TABLET | ORAL | 3 refills | Status: DC

## 2021-04-12 NOTE — Patient Instructions (Addendum)
°  Medication Instructions:    START TAKING AMIODARONE 100 MG ONCE A DAY EXCEPT ON SATURDAYS/SUNDAYS   *If you need a refill on your cardiac medications before your next appointment, please call your pharmacy*   Lab Work:  LFT AND TSH TODAY    If you have labs (blood work) drawn today and your tests are completely normal, you will receive your results only by: MyChart Message (if you have MyChart) OR A paper copy in the mail If you have any lab test that is abnormal or we need to change your treatment, we will call you to review the results.   Testing/Procedures: NONE ORDERED  TODAY     Follow-Up: At [facility], you and your health needs are our priority.  As part of our continuing mission to provide you with exceptional heart care, we have created designated Provider Care Teams.  These Care Teams include your primary Cardiologist (physician) and Advanced Practice Providers (APPs -  Physician Assistants and Nurse Practitioners) who all work together to provide you with the care you need, when you need it.  We recommend signing up for the patient portal called "MyChart".  Sign up information is provided on this After Visit Summary.  MyChart is used to connect with patients for Virtual Visits (Telemedicine).  Patients are able to view lab/test results, encounter notes, upcoming appointments, etc.  Non-urgent messages can be sent to your provider as well.   To learn more about what you can do with MyChart, go to [email or web address].    Your next appointment:   3-4  month(s)  The format for your next appointment:   In Person  Provider:   Tommye Standard, PA-C    Other Instructions

## 2021-04-13 LAB — HEPATIC FUNCTION PANEL
ALT: 18 IU/L (ref 0–44)
AST: 21 IU/L (ref 0–40)
Albumin: 4.2 g/dL (ref 3.8–4.8)
Alkaline Phosphatase: 142 IU/L — ABNORMAL HIGH (ref 44–121)
Bilirubin Total: 0.4 mg/dL (ref 0.0–1.2)
Bilirubin, Direct: 0.12 mg/dL (ref 0.00–0.40)
Total Protein: 6.4 g/dL (ref 6.0–8.5)

## 2021-04-13 LAB — TSH: TSH: 1.41 u[IU]/mL (ref 0.450–4.500)

## 2021-04-18 ENCOUNTER — Ambulatory Visit (INDEPENDENT_AMBULATORY_CARE_PROVIDER_SITE_OTHER): Payer: Medicare Other

## 2021-04-18 DIAGNOSIS — I428 Other cardiomyopathies: Secondary | ICD-10-CM | POA: Diagnosis not present

## 2021-04-18 LAB — CUP PACEART REMOTE DEVICE CHECK
Battery Remaining Longevity: 74 mo
Battery Remaining Percentage: 70 %
Battery Voltage: 2.99 V
Brady Statistic RV Percent Paced: 1 %
Date Time Interrogation Session: 20230116040123
HighPow Impedance: 41 Ohm
HighPow Impedance: 41 Ohm
Implantable Lead Implant Date: 20140703
Implantable Lead Location: 753860
Implantable Pulse Generator Implant Date: 20191113
Lead Channel Impedance Value: 390 Ohm
Lead Channel Pacing Threshold Amplitude: 0.75 V
Lead Channel Pacing Threshold Pulse Width: 0.5 ms
Lead Channel Sensing Intrinsic Amplitude: 3.2 mV
Lead Channel Setting Pacing Amplitude: 2.5 V
Lead Channel Setting Pacing Pulse Width: 0.5 ms
Lead Channel Setting Sensing Sensitivity: 0.5 mV
Pulse Gen Serial Number: 9820959

## 2021-04-25 ENCOUNTER — Inpatient Hospital Stay: Payer: Medicare Other | Attending: Internal Medicine

## 2021-04-25 ENCOUNTER — Ambulatory Visit (INDEPENDENT_AMBULATORY_CARE_PROVIDER_SITE_OTHER): Payer: Medicare Other

## 2021-04-25 ENCOUNTER — Other Ambulatory Visit: Payer: Self-pay

## 2021-04-25 ENCOUNTER — Ambulatory Visit (HOSPITAL_COMMUNITY)
Admission: RE | Admit: 2021-04-25 | Discharge: 2021-04-25 | Disposition: A | Discharge status: Home / Self Care | Payer: Medicare Other | Source: Ambulatory Visit | Attending: Internal Medicine | Admitting: Internal Medicine

## 2021-04-25 DIAGNOSIS — I5022 Chronic systolic (congestive) heart failure: Secondary | ICD-10-CM

## 2021-04-25 DIAGNOSIS — Z95810 Presence of automatic (implantable) cardiac defibrillator: Secondary | ICD-10-CM

## 2021-04-25 DIAGNOSIS — J439 Emphysema, unspecified: Secondary | ICD-10-CM | POA: Diagnosis not present

## 2021-04-25 DIAGNOSIS — C3490 Malignant neoplasm of unspecified part of unspecified bronchus or lung: Secondary | ICD-10-CM

## 2021-04-25 DIAGNOSIS — Z20822 Contact with and (suspected) exposure to covid-19: Secondary | ICD-10-CM | POA: Diagnosis not present

## 2021-04-25 DIAGNOSIS — I110 Hypertensive heart disease with heart failure: Secondary | ICD-10-CM | POA: Insufficient documentation

## 2021-04-25 DIAGNOSIS — C3411 Malignant neoplasm of upper lobe, right bronchus or lung: Secondary | ICD-10-CM | POA: Insufficient documentation

## 2021-04-25 DIAGNOSIS — J432 Centrilobular emphysema: Secondary | ICD-10-CM | POA: Insufficient documentation

## 2021-04-25 DIAGNOSIS — N289 Disorder of kidney and ureter, unspecified: Secondary | ICD-10-CM | POA: Insufficient documentation

## 2021-04-25 DIAGNOSIS — I2510 Atherosclerotic heart disease of native coronary artery without angina pectoris: Secondary | ICD-10-CM | POA: Insufficient documentation

## 2021-04-25 DIAGNOSIS — I700 Atherosclerosis of aorta: Secondary | ICD-10-CM | POA: Diagnosis not present

## 2021-04-25 LAB — CBC WITH DIFFERENTIAL (CANCER CENTER ONLY)
Abs Immature Granulocytes: 0.02 10*3/uL (ref 0.00–0.07)
Basophils Absolute: 0 10*3/uL (ref 0.0–0.1)
Basophils Relative: 1 %
Eosinophils Absolute: 0.1 10*3/uL (ref 0.0–0.5)
Eosinophils Relative: 3 %
HCT: 36.6 % — ABNORMAL LOW (ref 39.0–52.0)
Hemoglobin: 12 g/dL — ABNORMAL LOW (ref 13.0–17.0)
Immature Granulocytes: 0 %
Lymphocytes Relative: 13 %
Lymphs Abs: 0.6 10*3/uL — ABNORMAL LOW (ref 0.7–4.0)
MCH: 29.8 pg (ref 26.0–34.0)
MCHC: 32.8 g/dL (ref 30.0–36.0)
MCV: 90.8 fL (ref 80.0–100.0)
Monocytes Absolute: 0.7 10*3/uL (ref 0.1–1.0)
Monocytes Relative: 15 %
Neutro Abs: 3.1 10*3/uL (ref 1.7–7.7)
Neutrophils Relative %: 68 %
Platelet Count: 153 10*3/uL (ref 150–400)
RBC: 4.03 MIL/uL — ABNORMAL LOW (ref 4.22–5.81)
RDW: 15 % (ref 11.5–15.5)
WBC Count: 4.6 10*3/uL (ref 4.0–10.5)
nRBC: 0 % (ref 0.0–0.2)

## 2021-04-25 LAB — CMP (CANCER CENTER ONLY)
ALT: 15 U/L (ref 0–44)
AST: 15 U/L (ref 15–41)
Albumin: 3.8 g/dL (ref 3.5–5.0)
Alkaline Phosphatase: 109 U/L (ref 38–126)
Anion gap: 6 (ref 5–15)
BUN: 36 mg/dL — ABNORMAL HIGH (ref 8–23)
CO2: 27 mmol/L (ref 22–32)
Calcium: 9.2 mg/dL (ref 8.9–10.3)
Chloride: 107 mmol/L (ref 98–111)
Creatinine: 2 mg/dL — ABNORMAL HIGH (ref 0.61–1.24)
GFR, Estimated: 35 mL/min — ABNORMAL LOW (ref >60)
Glucose, Bld: 91 mg/dL (ref 70–99)
Potassium: 4 mmol/L (ref 3.5–5.1)
Sodium: 140 mmol/L (ref 135–145)
Total Bilirubin: 0.5 mg/dL (ref 0.3–1.2)
Total Protein: 6.7 g/dL (ref 6.5–8.1)

## 2021-04-27 NOTE — Progress Notes (Signed)
EPIC Encounter for ICM Monitoring  Patient Name: Jason Russell is a 71 y.o. male Date: 04/27/2021 Primary Care Physican: Lucianne Lei, MD Primary Cardiologist: Harrington Challenger Electrophysiologist: Caryl Comes 04/27/2021 Weight: 294 lbs (not weighing at home)   Spoke with patient and heart failure questions reviewed.  Pt asymptomatic for fluid accumulation.  Reports feeling well at this time and voices no complaints.    CorVue Thoracic impedance suggesting normal fluid levels.   Prescribed:  Furosemide 40 mg take 0.5 tablet (20 mg total) by mouth every other day alternating with 1 tablet (40 mg total) every other day.  Potassium 20 mEq 1 tablet daily.   Labs: 04/25/2021 Creatinine 2.00, BUN 36, Potassium 4.0, Sodium 140, GFR 35 12/10/2020 Creatinine 1.66, BUN 18, Potassium 3.7, Sodium 142, GFR 44 10/19/2020 Creatinine 1.77, BUN 23, Potassium 3.7, Sodium 142 A complete set of results can be found in results review   Recommendations:  No changes and encouraged to call if experiencing any fluid symptoms.   Follow-up plan: ICM clinic phone appointment on 05/30/2021.   91 day device clinic remote transmission 07/18/2021.     EP/Cardiology Office Visits:  07/21/2021 with Dr Harrington Challenger.  08/03/2021 with Tommye Standard, PA.   Copy of ICM check sent to Dr. Caryl Comes.    3 month ICM trend: 04/25/2021.    12-14 Month ICM trend:     [name], RN 04/27/2021 8:16 AM

## 2021-04-28 ENCOUNTER — Encounter: Payer: Self-pay | Admitting: Internal Medicine

## 2021-04-28 ENCOUNTER — Other Ambulatory Visit: Payer: Self-pay

## 2021-04-28 ENCOUNTER — Inpatient Hospital Stay (HOSPITAL_BASED_OUTPATIENT_CLINIC_OR_DEPARTMENT_OTHER): Payer: Medicare Other | Admitting: Internal Medicine

## 2021-04-28 VITALS — BP 142/68 | HR 79 | Temp 95.9°F | Resp 19 | Ht 72.0 in | Wt 288.7 lb

## 2021-04-28 DIAGNOSIS — I5022 Chronic systolic (congestive) heart failure: Secondary | ICD-10-CM | POA: Diagnosis not present

## 2021-04-28 DIAGNOSIS — C3411 Malignant neoplasm of upper lobe, right bronchus or lung: Secondary | ICD-10-CM | POA: Diagnosis not present

## 2021-04-28 DIAGNOSIS — C3490 Malignant neoplasm of unspecified part of unspecified bronchus or lung: Secondary | ICD-10-CM

## 2021-04-28 DIAGNOSIS — I2510 Atherosclerotic heart disease of native coronary artery without angina pectoris: Secondary | ICD-10-CM | POA: Diagnosis not present

## 2021-04-28 DIAGNOSIS — J432 Centrilobular emphysema: Secondary | ICD-10-CM | POA: Diagnosis not present

## 2021-04-28 DIAGNOSIS — I110 Hypertensive heart disease with heart failure: Secondary | ICD-10-CM | POA: Diagnosis not present

## 2021-04-28 DIAGNOSIS — N289 Disorder of kidney and ureter, unspecified: Secondary | ICD-10-CM | POA: Diagnosis not present

## 2021-04-28 NOTE — Progress Notes (Signed)
[place] Telephone:(336) [phone]   Fax:(336) [phone]  OFFICE PROGRESS NOTE  Lucianne Lei, MD [address] [place] [address]  DIAGNOSIS: Stage IIIA (T1a, N2, M0) non-small cell lung cancer, adenocarcinoma presented with right upper lobe lung nodule in addition to mediastinal lymphadenopathy diagnosed in March 2018.  Biomarker Findings Tumor Mutational Burden - TMB-Intermediate (8 Muts/Mb) Microsatellite Status - MS-Stable Genomic Findings For a complete list of the genes assayed, please refer to the Appendix. ERBB2 amplification - equivocal DNMT3A K416f*192 FUBP1 Q40* KEAP1 [id number] TP53 C275F 7 Disease relevant genes with no reportable alterations: EGFR, KRAS, ALK, BRAF, MET, RET, ROS1   PRIOR THERAPY:  1) Course of concurrent chemoradiation with weekly carboplatin for AUC of 2 and paclitaxel 45 MG/M2. Status post 6 cycles. Last cycle was given 10/16/2016. 2)  Consolidation treatment with immunotherapy with Imfinzi (Durvalumab) 10 MG/M2 every 2 weeks. First dose 12/21/2016.  Status post 26 cycles.  CURRENT THERAPY: Observation.  INTERVAL HISTORY: [name]71.o. male returns to the clinic today for 6-51-monthllow-up visit.  The patient is feeling fine today with no concerning complaints.  He denied having any current chest pain, shortness of breath, cough or hemoptysis.  He denied having any fever or chills.  He has no nausea, vomiting, diarrhea or constipation.  He has no headache or visual changes.  He has no recent weight loss or night sweats.  He is here today for evaluation with repeat CT scan of the chest for restaging of his disease.   MEDICAL HISTORY: Past Medical History:  Diagnosis Date   Adenocarcinoma of right lung, stage 3 (HCCBushong/23/2018   Anemia    Arthritis    "hx right hip"   Asthma    "when I was a child"   Atrial fibrillation ([facility]  Amiodarone started 10/2011; Coumadin   Automatic implantable  cardioverter-defibrillator in situ 10/03/2012   a. St. Jude ICD implantation 10/03/12.   Chronic anticoagulation    Chronic fatigue 10/18/12/4818Chronic systolic heart failure (HCCUnion Level  a. Echo 7/13: EF 25%;  b. echo 04/2012:  Mild LVH, EF 30-35%, Gr 1 DD, mild AI, mild MR, mild LAE   COPD (chronic obstructive pulmonary disease) (HCC)    Dyslipidemia    Gout    History of blood transfusion 10/15/2013   "don't know where the blood's going; HgB down to 5"   Hyperlipidemia    Hypertension    Hypothyroidism    NICM (nonischemic cardiomyopathy) (HCC[place]er[place]  [id number]:  minimal CAD   Obesity    OSA on CPAP    Tobacco abuse     ALLERGIES:  has No Known Allergies.  MEDICATIONS:  Current Outpatient Medications  Medication Sig Dispense Refill   albuterol (VENTOLIN HFA) 108 (90 Base) MCG/ACT inhaler INHALE 2 PUFFS INTO THE LUNGS EVERY 4 HOURS AS NEEDED FOR WHEEZING OR SHORTNESS OF BREATH. 8.5 g 5   amiodarone (PACERONE) 200 MG tablet Take 0.5 tablets (100 mg total) by mouth See admin instructions. Take daily Except Saturday and Sunday 45 tablet 3   amLODipine (NORVASC) 5 MG tablet Take 5 mg by mouth daily.     atorvastatin (LIPITOR) 40 MG tablet TAKE 1 TABLET BY MOUTH EVERY DAY 90 tablet 3   carvedilol (COREG) 12.5 MG tablet TAKE 1 TABLET BY MOUTH TWICE A DAY (Patient taking differently: Take 12.5 mg by mouth 2 (two) times daily with a meal.) 180  tablet 3   [employer] D3 2000 units CAPS Take 2,000 Units by mouth daily.   11   ferrous gluconate (FERGON) 324 MG tablet TAKE 1 TABLET (324 MG TOTAL) BY MOUTH 3 (THREE) TIMES DAILY WITH MEALS 270 tablet 1   Fluticasone-Umeclidin-Vilant (TRELEGY ELLIPTA) 100-62.5-25 MCG/INH AEPB Inhale 1 puff into the lungs daily. 3 each 3   furosemide (LASIX) 40 MG tablet Take 1 tablet (40 mg) every other day, alternating with 1/2 tablet (20 mg) every other day. 90 tablet 3   gabapentin (NEURONTIN) 100 MG capsule Take 1 capsule (100 mg total) by mouth 3 (three) times daily. For pain  90 capsule 0   hydrALAZINE (APRESOLINE) 100 MG tablet TAKE 1 TABLET BY MOUTH THREE TIMES A DAY 270 tablet 1   levothyroxine (SYNTHROID) 50 MCG tablet TAKE 1 TABLET BY MOUTH EVERY DAY BEFORE BREAKFAST 90 tablet 3   magnesium oxide (MAG-OX) 400 (241.3 Mg) MG tablet Take 1 tablet (400 mg total) by mouth daily. 30 tablet 0   methocarbamol (ROBAXIN) 500 MG tablet Take 1 tablet (500 mg total) by mouth every 8 (eight) hours as needed for muscle spasms. 20 tablet 0   Polyvinyl Alcohol-Povidone (REFRESH OP) Place 1 drop into both eyes every morning.     Potassium Chloride ER 20 MEQ TBCR TAKE 1 TABLET BY MOUTH EVERY DAY 90 tablet 1   sacubitril-valsartan (ENTRESTO) 97-103 MG Take 1 tablet by mouth 2 (two) times daily. 180 tablet 3   sildenafil (VIAGRA) 100 MG tablet Take 1 tablet (100 mg total) by mouth daily as needed for erectile dysfunction. 10 tablet 1   spironolactone (ALDACTONE) 25 MG tablet Take 1 tablet (25 mg total) by mouth daily. 90 tablet 2   ULORIC 40 MG tablet Take 1 tablet (40 mg total) by mouth daily. 30 tablet 0   XARELTO 20 MG TABS tablet TAKE 1 TABLET BY MOUTH EVERY DAY (Patient taking differently: Take 20 mg by mouth daily with supper.) 90 tablet 3   No current facility-administered medications for this visit.    SURGICAL HISTORY:  Past Surgical History:  Procedure Laterality Date   CARDIAC DEFIBRILLATOR PLACEMENT  2014   CARDIOVERSION  2011   CARDIOVERSION N/A 03/15/2020   Procedure: CARDIOVERSION;  Surgeon: Fay Records, MD;  Location: [facility];  Service: Cardiovascular;  Laterality: N/A;   COLONOSCOPY WITH PROPOFOL Left 10/17/2013   Procedure: COLONOSCOPY WITH PROPOFOL;  Surgeon: Inda Castle, MD;  Location: [facility];  Service: Endoscopy;  Laterality: Left;   ESOPHAGOGASTRODUODENOSCOPY N/A 10/17/2013   Procedure: ESOPHAGOGASTRODUODENOSCOPY (EGD);  Surgeon: Inda Castle, MD;  Location: [facility];  Service: Endoscopy;  Laterality: N/A;    ESOPHAGOGASTRODUODENOSCOPY (EGD) WITH PROPOFOL N/A 06/14/2018   Procedure: ESOPHAGOGASTRODUODENOSCOPY (EGD) WITH PROPOFOL;  Surgeon: Doran Stabler, MD;  Location: [facility];  Service: Gastroenterology;  Laterality: N/A;   GIVENS CAPSULE STUDY N/A 10/29/2013   Procedure: GIVENS CAPSULE STUDY;  Surgeon: Inda Castle, MD;  Location: [facility];  Service: Endoscopy;  Laterality: N/A;   ICD GENERATOR CHANGEOUT N/A 02/13/2018   Procedure: ICD GENERATOR CHANGEOUT;  Surgeon: Constance Haw, MD;  Location: [facility];  Service: Cardiovascular;  Laterality: N/A;   IMPLANTABLE CARDIOVERTER DEFIBRILLATOR IMPLANT Left 10/03/2012   Procedure: IMPLANTABLE CARDIOVERTER DEFIBRILLATOR IMPLANT;  Surgeon: Deboraha Sprang, MD;  Location: [facility];  Service: Cardiovascular;  Laterality: Left;   IR FLUORO GUIDE PORT INSERTION RIGHT  09/21/2016   IR US GUIDE VASC ACCESS RIGHT  09/21/2016  JOINT REPLACEMENT     REVERSE SHOULDER ARTHROPLASTY Right 09/22/2020   Procedure: SHOULDER HEMI ARTHROPLASTY;  Surgeon: Hiram Gash, MD;  Location: [facility];  Service: Orthopedics;  Laterality: Right;   SAVORY DILATION N/A 06/14/2018   Procedure: SAVORY DILATION;  Surgeon: Doran Stabler, MD;  Location: [facility];  Service: Gastroenterology;  Laterality: N/A;   TONSILLECTOMY  1950's   TOTAL HIP ARTHROPLASTY Right 11/25/1997   VIDEO BRONCHOSCOPY WITH ENDOBRONCHIAL ULTRASOUND N/A 08/09/2016   Procedure: VIDEO BRONCHOSCOPY WITH ENDOBRONCHIAL ULTRASOUND;  Surgeon: Javier Glazier, MD;  Location: [facility];  Service: Thoracic;  Laterality: N/A;    REVIEW OF SYSTEMS:  A comprehensive review of systems was negative except for: Constitutional: positive for fatigue Respiratory: positive for dyspnea on exertion   PHYSICAL EXAMINATION: General appearance: alert, cooperative, and no distress Head: Normocephalic, without obvious abnormality, atraumatic Neck: no adenopathy, no JVD, supple, symmetrical, trachea midline,  and thyroid not enlarged, symmetric, no tenderness/mass/nodules Lymph nodes: Cervical, supraclavicular, and axillary nodes normal. Resp: clear to auscultation bilaterally Back: symmetric, no curvature. ROM normal. No CVA tenderness. Cardio: regular rate and rhythm, S1, S2 normal, no murmur, click, rub or gallop GI: soft, non-tender; bowel sounds normal; no masses,  no organomegaly Extremities: extremities normal, atraumatic, no cyanosis or edema  ECOG PERFORMANCE STATUS: 1 - Symptomatic but completely ambulatory  Blood pressure (!) 142/68, pulse 79, temperature (!) 95.9 F (35.5 C), temperature source Tympanic, resp. rate 19, height 6' (1.829 m), weight 288 lb 11.2 oz (131 kg), SpO2 100 %.  LABORATORY DATA: Lab Results  Component Value Date   WBC 4.6 04/25/2021   HGB 12.0 (L) 04/25/2021   HCT 36.6 (L) 04/25/2021   MCV 90.8 04/25/2021   PLT 153 04/25/2021      Chemistry      Component Value Date/Time   NA 140 04/25/2021 1204   NA 142 10/13/2020 0000   NA 141 03/29/2017 1038   K 4.0 04/25/2021 1204   K 4.0 03/29/2017 1038   CL 107 04/25/2021 1204   CO2 27 04/25/2021 1204   CO2 27 03/29/2017 1038   BUN 36 (H) 04/25/2021 1204   BUN 25 10/13/2020 0000   BUN 28.6 (H) 03/29/2017 1038   CREATININE 2.00 (H) 04/25/2021 1204   CREATININE 1.6 (H) 03/29/2017 1038      Component Value Date/Time   CALCIUM 9.2 04/25/2021 1204   CALCIUM 9.3 03/29/2017 1038   ALKPHOS 109 04/25/2021 1204   ALKPHOS 105 03/29/2017 1038   AST 15 04/25/2021 1204   AST 18 03/29/2017 1038   ALT 15 04/25/2021 1204   ALT 13 03/29/2017 1038   BILITOT 0.5 04/25/2021 1204   BILITOT 0.62 03/29/2017 1038       RADIOGRAPHIC STUDIES: CT Chest Wo Contrast  Result Date: 04/26/2021 CLINICAL DATA:  Primary Cancer Type: Lung Imaging Indication: Routine surveillance Interval therapy since last imaging? No Initial Cancer Diagnosis Date: 08/09/2016; Established by: Biopsy-proven Detailed Pathology: Stage IIIA  non-small cell lung cancer, adenocarcinoma. Primary Tumor location:  Right upper lobe. Surgeries: Implantable cardiac defibrillator. Chemotherapy: Yes; Ongoing? No; Most recent administration: 10/16/2016 Immunotherapy?  Yes; Type: Imfinzi; Ongoing? No Radiation therapy? Yes; Date Range: 09/05/2016 - 10/20/2016; Target: Right lung EXAM: CT CHEST WITHOUT CONTRAST TECHNIQUE: Multidetector CT imaging of the chest was performed following the standard protocol without IV contrast. RADIATION DOSE REDUCTION: This exam was performed according to the departmental dose-optimization program which includes automated exposure control, adjustment of the mA and/or kV according to patient  size and/or use of iterative reconstruction technique. COMPARISON:  Most recent CT chest 10/18/2020. 07/31/2016 PET-CT. FINDINGS: Cardiovascular: Heart size is normal. There is no significant pericardial fluid, thickening or pericardial calcification. There is aortic atherosclerosis, as well as atherosclerosis of the great vessels of the mediastinum and the coronary arteries, including calcified atherosclerotic plaque in the left main, left anterior descending and left circumflex coronary arteries. Right internal jugular single-lumen porta cath with tip terminating in the distal superior vena cava. Left-sided pacemaker device in place with lead tip terminating in the right ventricle near the apex. Mediastinum/Nodes: No pathologically enlarged mediastinal or hilar lymph nodes. Esophagus is unremarkable in appearance. No axillary lymphadenopathy. Lungs/Pleura: Chronic areas of architectural distortion and volume loss in the paramediastinal aspect of the right lung, similar to the prior study, compatible with chronic postradiation fibrosis. No definite suspicious appearing pulmonary nodules or masses are noted. No acute consolidative airspace disease. No pleural effusions. Mild diffuse bronchial wall thickening with mild centrilobular and paraseptal  emphysema. Upper Abdomen: Nonobstructive calculi in the collecting system of the right kidney measuring up to 9 mm in the lower pole. 1.9 cm low-attenuation lesion in the lateral aspect of the interpolar region of the left kidney, incompletely characterized on today's non-contrast CT examination, but similar to the prior study and statistically likely to represent a cyst. Atherosclerotic calcifications in the thoracic aorta. Musculoskeletal: There are no aggressive appearing lytic or blastic lesions noted in the visualized portions of the skeleton. IMPRESSION: 1. Stable examination demonstrating chronic postradiation changes in the paramediastinal aspect of the right lung, without evidence to suggest local recurrence of disease or definite metastatic disease in the chest. 2. Aortic atherosclerosis, in addition to left main and 2 vessel coronary artery disease. Please note that although the presence of coronary artery calcium documents the presence of coronary artery disease, the severity of this disease and any potential stenosis cannot be assessed on this non-gated CT examination. Assessment for potential risk factor modification, dietary therapy or pharmacologic therapy may be warranted, if clinically indicated. 3. Mild diffuse bronchial wall thickening with mild centrilobular and paraseptal emphysema; imaging findings suggestive of underlying COPD. 4. Nonobstructive calculi in the collecting system of the right kidney measuring up to 9 mm in the lower pole. Electronically Signed   By: Vinnie Langton M.D.   On: 04/26/2021 10:56   CUP PACEART INCLINIC DEVICE CHECK  Result Date: 04/12/2021 ICD check in clinic. Normal device function. Thresholds and sensing consistent with previous device measurements. Impedance trends stable over time. No mode switches. No ventricular arrhythmias. Histogram distribution appropriate for patient and level of  activity. No changes made this session. Device programmed at  appropriate safety margins. Device programmed to optimize intrinsic conduction. Estimated longevity __6.2years_. Pt enrolled in remote follow-up. Patient education completed including shock plan. Auditory/vibratory alert demonstrated.  CUP PACEART REMOTE DEVICE CHECK  Result Date: 04/18/2021 Scheduled remote reviewed. Normal device function.  Next remote 91 days. [place]    ASSESSMENT AND PLAN:  This is a very pleasant 71 years old African-American male with stage IIIA non-small cell lung cancer, adenocarcinoma.  The patient completed 6 weeks of concurrent chemoradiation with weekly carboplatin and paclitaxel and tolerated his treatment well except for odynophagia. The patient was then started on consolidation treatment with immunotherapy with Imfinzi (Durvalumab) status post 26 cycles.  The patient has been on observation for the last few years and he is feeling fine today with no concerning complaints. He had repeat CT scan of the chest without  contrast performed recently.  I personally and independently reviewed the scans and discussed the results with the patient today. His scan showed no concerning findings for disease progression. I recommended for him to continue on observation with repeat CT scan of the chest in 6 months. The patient was advised to call immediately if he has any other concerning symptoms in the interval. Regarding the COPD, renal insufficiency and coronary artery disease, he will follow with his primary care physician.  The patient voices understanding of current disease status and treatment options and is in agreement with the current care plan. All questions were answered. The patient knows to call the clinic with any problems, questions or concerns. We can certainly see the patient much sooner if necessary. Disclaimer: This note was dictated with voice recognition software. Similar sounding words can inadvertently be transcribed and may not be corrected upon review.

## 2021-05-02 ENCOUNTER — Other Ambulatory Visit: Payer: Self-pay | Admitting: Internal Medicine

## 2021-05-02 MED ORDER — SPIRONOLACTONE 25 MG PO TABS: 25.0000 mg | ORAL_TABLET | Freq: Every day | ORAL | 3 refills | Status: DC

## 2021-05-02 NOTE — Telephone Encounter (Addendum)
Prescription refill request for Xarelto received.  Indication: afib  Last office visit: Charlcie Cradle, 04/12/2021 Weight: 131 kg Age: 71 yo  Scr: 2.00, 04/25/2021 CrCl: 64 ml/min

## 2021-05-02 NOTE — Progress Notes (Signed)
Remote ICD transmission.   

## 2021-05-11 DIAGNOSIS — Z20822 Contact with and (suspected) exposure to covid-19: Secondary | ICD-10-CM | POA: Diagnosis not present

## 2021-05-30 ENCOUNTER — Ambulatory Visit (INDEPENDENT_AMBULATORY_CARE_PROVIDER_SITE_OTHER): Payer: Medicare Other

## 2021-05-30 DIAGNOSIS — Z95810 Presence of automatic (implantable) cardiac defibrillator: Secondary | ICD-10-CM | POA: Diagnosis not present

## 2021-05-30 DIAGNOSIS — I5022 Chronic systolic (congestive) heart failure: Secondary | ICD-10-CM | POA: Diagnosis not present

## 2021-06-03 ENCOUNTER — Telehealth: Payer: Self-pay

## 2021-06-03 NOTE — Progress Notes (Signed)
EPIC Encounter for ICM Monitoring  Patient Name: Jason Russell is a 71 y.o. male Date: 06/03/2021 Primary Care Physican: Lucianne Lei, MD Primary Cardiologist: Harrington Challenger Electrophysiologist: Caryl Comes 04/27/2021 Weight: 294 lbs (not weighing at home)   Attempted call to patient and unable to reach.  Left detailed message per DPR regarding transmission. Transmission reviewed.    CorVue Thoracic impedance suggesting normal fluid levels.   Prescribed:  Furosemide 40 mg take 0.5 tablet (20 mg total) by mouth every other day alternating with 1 tablet (40 mg total) every other day.  Potassium 20 mEq 1 tablet daily.   Labs: 04/25/2021 Creatinine 2.00, BUN 36, Potassium 4.0, Sodium 140, GFR 35 12/10/2020 Creatinine 1.66, BUN 18, Potassium 3.7, Sodium 142, GFR 44 10/19/2020 Creatinine 1.77, BUN 23, Potassium 3.7, Sodium 142 A complete set of results can be found in results review   Recommendations:  Left voice mail with ICM number and encouraged to call if experiencing any fluid symptoms.   Follow-up plan: ICM clinic phone appointment on 07/04/2021.   91 day device clinic remote transmission 07/18/2021.     EP/Cardiology Office Visits:  07/21/2021 with Dr Harrington Challenger.  08/03/2021 with Tommye Standard, PA.   Copy of ICM check sent to Dr. Caryl Comes.   3 month ICM trend: 05/30/2021.    12-14 Month ICM trend:     [name], RN 06/03/2021 12:33 PM

## 2021-06-03 NOTE — Telephone Encounter (Signed)
Remote ICM transmission received.  Attempted call to patient regarding ICM remote transmission and left detailed message per DPR.  Advised to return call for any fluid symptoms or questions. Next ICM remote transmission scheduled 07/04/2021.   ? ?

## 2021-06-08 DIAGNOSIS — I129 Hypertensive chronic kidney disease with stage 1 through stage 4 chronic kidney disease, or unspecified chronic kidney disease: Secondary | ICD-10-CM | POA: Diagnosis not present

## 2021-06-08 DIAGNOSIS — N183 Chronic kidney disease, stage 3 unspecified: Secondary | ICD-10-CM | POA: Diagnosis not present

## 2021-06-13 DIAGNOSIS — R7989 Other specified abnormal findings of blood chemistry: Secondary | ICD-10-CM | POA: Insufficient documentation

## 2021-06-13 DIAGNOSIS — Z87891 Personal history of nicotine dependence: Secondary | ICD-10-CM | POA: Diagnosis not present

## 2021-06-13 DIAGNOSIS — Z85118 Personal history of other malignant neoplasm of bronchus and lung: Secondary | ICD-10-CM | POA: Diagnosis not present

## 2021-06-13 DIAGNOSIS — I130 Hypertensive heart and chronic kidney disease with heart failure and stage 1 through stage 4 chronic kidney disease, or unspecified chronic kidney disease: Secondary | ICD-10-CM | POA: Diagnosis not present

## 2021-06-13 DIAGNOSIS — N1832 Chronic kidney disease, stage 3b: Secondary | ICD-10-CM | POA: Diagnosis not present

## 2021-06-13 DIAGNOSIS — I5020 Unspecified systolic (congestive) heart failure: Secondary | ICD-10-CM | POA: Diagnosis not present

## 2021-06-13 DIAGNOSIS — E559 Vitamin D deficiency, unspecified: Secondary | ICD-10-CM | POA: Diagnosis not present

## 2021-06-13 DIAGNOSIS — D631 Anemia in chronic kidney disease: Secondary | ICD-10-CM | POA: Diagnosis not present

## 2021-06-14 DIAGNOSIS — M19011 Primary osteoarthritis, right shoulder: Secondary | ICD-10-CM | POA: Diagnosis not present

## 2021-06-14 DIAGNOSIS — M25512 Pain in left shoulder: Secondary | ICD-10-CM | POA: Diagnosis not present

## 2021-06-25 DIAGNOSIS — Z20822 Contact with and (suspected) exposure to covid-19: Secondary | ICD-10-CM | POA: Diagnosis not present

## 2021-06-29 DIAGNOSIS — Z20822 Contact with and (suspected) exposure to covid-19: Secondary | ICD-10-CM | POA: Diagnosis not present

## 2021-06-30 DIAGNOSIS — Z20828 Contact with and (suspected) exposure to other viral communicable diseases: Secondary | ICD-10-CM | POA: Diagnosis not present

## 2021-07-04 ENCOUNTER — Ambulatory Visit (INDEPENDENT_AMBULATORY_CARE_PROVIDER_SITE_OTHER): Payer: Medicare Other

## 2021-07-04 DIAGNOSIS — I5022 Chronic systolic (congestive) heart failure: Secondary | ICD-10-CM

## 2021-07-04 DIAGNOSIS — Z95810 Presence of automatic (implantable) cardiac defibrillator: Secondary | ICD-10-CM | POA: Diagnosis not present

## 2021-07-06 ENCOUNTER — Encounter (HOSPITAL_COMMUNITY): Payer: Self-pay | Admitting: *Deleted

## 2021-07-06 ENCOUNTER — Telehealth: Payer: Self-pay | Admitting: Student

## 2021-07-06 ENCOUNTER — Other Ambulatory Visit: Payer: Self-pay

## 2021-07-06 ENCOUNTER — Emergency Department (HOSPITAL_COMMUNITY)
Admission: EM | Admit: 2021-07-06 | Discharge: 2021-07-07 | Disposition: A | Discharge status: Home / Self Care | Payer: Medicare Other | Attending: Emergency Medicine | Admitting: Emergency Medicine

## 2021-07-06 ENCOUNTER — Emergency Department (HOSPITAL_COMMUNITY): Payer: Medicare Other

## 2021-07-06 DIAGNOSIS — I4891 Unspecified atrial fibrillation: Secondary | ICD-10-CM | POA: Insufficient documentation

## 2021-07-06 DIAGNOSIS — N179 Acute kidney failure, unspecified: Secondary | ICD-10-CM

## 2021-07-06 DIAGNOSIS — T82198A Other mechanical complication of other cardiac electronic device, initial encounter: Secondary | ICD-10-CM | POA: Insufficient documentation

## 2021-07-06 DIAGNOSIS — Z452 Encounter for adjustment and management of vascular access device: Secondary | ICD-10-CM | POA: Diagnosis not present

## 2021-07-06 DIAGNOSIS — Z4502 Encounter for adjustment and management of automatic implantable cardiac defibrillator: Secondary | ICD-10-CM

## 2021-07-06 DIAGNOSIS — Z95810 Presence of automatic (implantable) cardiac defibrillator: Secondary | ICD-10-CM | POA: Diagnosis not present

## 2021-07-06 LAB — BASIC METABOLIC PANEL
Anion gap: 7 (ref 5–15)
BUN: 27 mg/dL — ABNORMAL HIGH (ref 8–23)
CO2: 22 mmol/L (ref 22–32)
Calcium: 8.8 mg/dL — ABNORMAL LOW (ref 8.9–10.3)
Chloride: 111 mmol/L (ref 98–111)
Creatinine, Ser: 2.25 mg/dL — ABNORMAL HIGH (ref 0.61–1.24)
GFR, Estimated: 31 mL/min — ABNORMAL LOW (ref >60)
Glucose, Bld: 100 mg/dL — ABNORMAL HIGH (ref 70–99)
Potassium: 3.9 mmol/L (ref 3.5–5.1)
Sodium: 140 mmol/L (ref 135–145)

## 2021-07-06 LAB — TROPONIN I (HIGH SENSITIVITY): Troponin I (High Sensitivity): 21 ng/L — ABNORMAL HIGH (ref <18)

## 2021-07-06 LAB — CBC
HCT: 38.5 % — ABNORMAL LOW (ref 39.0–52.0)
Hemoglobin: 12.1 g/dL — ABNORMAL LOW (ref 13.0–17.0)
MCH: 30.3 pg (ref 26.0–34.0)
MCHC: 31.4 g/dL (ref 30.0–36.0)
MCV: 96.3 fL (ref 80.0–100.0)
Platelets: 128 10*3/uL — ABNORMAL LOW (ref 150–400)
RBC: 4 MIL/uL — ABNORMAL LOW (ref 4.22–5.81)
RDW: 13.8 % (ref 11.5–15.5)
WBC: 4 10*3/uL (ref 4.0–10.5)
nRBC: 0 % (ref 0.0–0.2)

## 2021-07-06 NOTE — Telephone Encounter (Signed)
Notified by nursing line that Jason Russell called in reporting experiencing an ICD shock.  He was curious if he should present for evaluation.  I called Jason Russell and went to [employer].  Left VM instructing him to present for interrogation and evaluation. ? ?C. Albertha Ghee, MD ?Cardiology Fellow ?[facility] ? ?

## 2021-07-06 NOTE — ED Triage Notes (Signed)
The pt was at home  1 1/2 hours ago at home and his defribrilator fired no chest pain   has not occurred since 2017 ?

## 2021-07-06 NOTE — Progress Notes (Signed)
EPIC Encounter for ICM Monitoring ? ?Patient Name: Jason Russell is a 71 y.o. male ?Date: 07/06/2021 ?Primary Care Physican: Lucianne Lei, MD ?Primary Cardiologist: Harrington Challenger ?Electrophysiologist: Caryl Comes ?07/06/2021 Weight: 280 lbs  ?  ?Spoke with patient and heart failure questions reviewed.  Pt asymptomatic for fluid accumulation.  Kidney physician advised to drink more fluids at March OV.   ?  ?CorVue Thoracic impedance normal but was suggesting possible fluid accumulation 3/7-3/19 followed by possible dryness 3/25-4/1.   ?  ?Prescribed:  ?Furosemide 40 mg take 0.5 tablet (20 mg total) by mouth every other day alternating with 1 tablet (40 mg total) every other day.  ?Potassium 20 mEq 1 tablet daily. ?  ?Labs: ?06/08/2021 Creatinine 2.09, BUN 30, Potassium 4.0, Sodium 140, GFR 34 ?04/25/2021 Creatinine 2.00, BUN 36, Potassium 4.0, Sodium 140, GFR 35 ?12/10/2020 Creatinine 1.66, BUN 18, Potassium 3.7, Sodium 142, GFR 44 ?10/19/2020 Creatinine 1.77, BUN 23, Potassium 3.7, Sodium 142 ?A complete set of results can be found in results review ?  ?Recommendations:  Advised to drink approximately 64 oz fluid daily to stay hydrated. No changes and encouraged to call if experiencing any fluid symptoms. ?  ?Follow-up plan: ICM clinic phone appointment on 08/08/2021.   91 day device clinic remote transmission 07/18/2021.   ?  ?EP/Cardiology Office Visits:  07/21/2021 with Dr Harrington Challenger.  08/03/2021 with Tommye Standard, PA. ?  ?Copy of ICM check sent to Dr. Caryl Comes.  ? ?3 month ICM trend: 07/04/2021. ? ? ? ?12-14 Month ICM trend:  ? ? ? ?[name], RN ?07/06/2021 ?3:18 PM ? ?

## 2021-07-06 NOTE — ED Provider Triage Note (Addendum)
Emergency Medicine Provider Triage Evaluation Note ? ?Jason Russell , a 71 y.o. male  was evaluated in triage.  Pt complains of AICD firing. ? ?Review of Systems  ?Positive: tired ?Negative: Fever, cough, cp, sob, n/v ? ?Physical Exam  ?BP 128/69   Pulse 88   Temp 98.3 ?F (36.8 ?C)   Resp 18   Ht 6' (1.829 m)   Wt 131 kg   SpO2 98%   BMI 39.17 kg/m?  ?Gen:   Awake, no distress   ?Resp:  Normal effort  ?MSK:   Moves extremities without difficulty  ?Other:   ? ?Medical Decision Making  ?Medically screening exam initiated at 10:06 PM.  Appropriate orders placed.  Jason Russell was informed that the remainder of the evaluation will be completed by another provider, this initial triage assessment does not replace that evaluation, and the importance of remaining in the ED until their evaluation is complete. ? ?Went fishing today and once he got home he felt his AICD firing.  No cp.  Felt a bit tired but thought it was due to being out all day.  Felt better now. St. Jude device ? ?  ?Domenic Moras, PA-C ?07/06/21 2217 ? ?

## 2021-07-06 NOTE — ED Provider Notes (Signed)
?[place] ?Provider Note ? ? ?CSN: [id number] ?Arrival date & time: 07/06/21  2145 ? ?  ? ?History ? ?Chief Complaint  ?Patient presents with  ? AICD Problem  ? ? ?Jason Russell is a 71 y.o. male. ? ?71 yo M here for his defibrillator firing. Has had it for 6-7 years, has been shocked previously. Today he felt a little tired but otherwise normal. No chest pain, palpitations, dyspnea. When he got home he felt the same feeling he had in 2018 of being kicked by a horse. States his energy levels improved afterwards. Asymptomatic at this time. Called cardiology who recommeneded evaluation.  ? ?Warehouse manager. ? ? ?Home Medications ?Prior to Admission medications   ?Medication Sig Start Date End Date Taking? Authorizing Provider  ?albuterol (VENTOLIN HFA) 108 (90 Base) MCG/ACT inhaler INHALE 2 PUFFS INTO THE LUNGS EVERY 4 HOURS AS NEEDED FOR WHEEZING OR SHORTNESS OF BREATH. 09/14/20   Parrett, Fonnie Mu, NP  ?amiodarone (PACERONE) 200 MG tablet Take 0.5 tablets (100 mg total) by mouth See admin instructions. Take daily Except Saturday and Sunday 04/12/21   Baldwin [place], PA-C  ?amLODipine (NORVASC) 5 MG tablet Take 5 mg by mouth daily. 05/02/12   [provider]  ?atorvastatin (LIPITOR) 40 MG tablet TAKE 1 TABLET BY MOUTH EVERY DAY 09/27/20   Fay Records, MD  ?carvedilol (COREG) 12.5 MG tablet TAKE 1 TABLET BY MOUTH TWICE A DAY 05/02/21   Deboraha Sprang, MD  ?[employer]S D3 2000 units CAPS Take 2,000 Units by mouth daily.  11/02/16   [provider]  ?ferrous gluconate (FERGON) 324 MG tablet TAKE 1 TABLET (324 MG TOTAL) BY MOUTH 3 (THREE) TIMES DAILY WITH MEALS 03/30/21   Deboraha Sprang, MD  ?Fluticasone-Umeclidin-Vilant (TRELEGY ELLIPTA) 100-62.5-25 MCG/INH AEPB Inhale 1 puff into the lungs daily. 01/03/21   Parrett, Fonnie Mu, NP  ?furosemide (LASIX) 40 MG tablet Take 1 tablet (40 mg) every other day, alternating with 1/2 tablet (20 mg) every other day. 10/06/20    Fay Records, MD  ?hydrALAZINE (APRESOLINE) 100 MG tablet TAKE 1 TABLET BY MOUTH THREE TIMES A DAY 02/10/21   Fay Records, MD  ?levothyroxine (SYNTHROID) 50 MCG tablet TAKE 1 TABLET BY MOUTH EVERY DAY BEFORE BREAKFAST 03/24/21   Fay Records, MD  ?magnesium oxide (MAG-OX) 400 (241.3 Mg) MG tablet Take 1 tablet (400 mg total) by mouth daily. 01/10/17   Geradine Girt, DO  ?Polyvinyl Alcohol-Povidone (REFRESH OP) Place 1 drop into both eyes every morning.    [provider]  ?Potassium Chloride ER 20 MEQ TBCR TAKE 1 TABLET BY MOUTH EVERY DAY 02/10/21   Fay Records, MD  ?rivaroxaban (XARELTO) 20 MG TABS tablet TAKE 1 TABLET BY MOUTH EVERY DAY 05/02/21   Fay Records, MD  ?sacubitril-valsartan (ENTRESTO) 97-103 MG Take 1 tablet by mouth 2 (two) times daily. 06/09/20   Fay Records, MD  ?sildenafil (VIAGRA) 100 MG tablet Take 1 tablet (100 mg total) by mouth daily as needed for erectile dysfunction. 12/01/19   Fay Records, MD  ?spironolactone (ALDACTONE) 25 MG tablet Take 1 tablet (25 mg total) by mouth daily. 05/02/21   Deboraha Sprang, MD  ?ULORIC 40 MG tablet Take 1 tablet (40 mg total) by mouth daily. 10/17/16   Deboraha Sprang, MD  ?   ? ?Allergies    ?Patient has no known allergies.   ? ?Review of Systems   ?  Review of Systems ? ?Physical Exam ?Updated Vital Signs ?BP 137/69   Pulse (!) 55   Temp 98.3 ?F (36.8 ?C)   Resp (!) 21   Ht 6' (1.829 m)   Wt 131 kg   SpO2 98%   BMI 39.17 kg/m?  ?Physical Exam ?Vitals and nursing note reviewed.  ?Constitutional:   ?   Appearance: He is well-developed.  ?HENT:  ?   Head: Normocephalic and atraumatic.  ?   Mouth/Throat:  ?   Mouth: Mucous membranes are moist.  ?   Pharynx: Oropharynx is clear.  ?Eyes:  ?   Pupils: Pupils are equal, round, and reactive to light.  ?Cardiovascular:  ?   Rate and Rhythm: Normal rate.  ?Pulmonary:  ?   Effort: Pulmonary effort is normal. No respiratory distress.  ?Abdominal:  ?   General: Abdomen is flat. There is no  distension.  ?Musculoskeletal:     ?   General: No swelling or tenderness. Normal range of motion.  ?   Cervical back: Normal range of motion.  ?Skin: ?   General: Skin is warm and dry.  ?   Coloration: Skin is not jaundiced or pale.  ?Neurological:  ?   General: No focal deficit present.  ?   Mental Status: He is alert.  ? ? ?ED Results / Procedures / Treatments   ?Labs ?(all labs ordered are listed, but only abnormal results are displayed) ?Labs Reviewed  ?BASIC METABOLIC PANEL - Abnormal; Notable for the following components:  ?    Result Value  ? Glucose, Bld 100 (*)   ? BUN 27 (*)   ? Creatinine, Ser 2.25 (*)   ? Calcium 8.8 (*)   ? GFR, Estimated 31 (*)   ? All other components within normal limits  ?CBC - Abnormal; Notable for the following components:  ? RBC 4.00 (*)   ? Hemoglobin 12.1 (*)   ? HCT 38.5 (*)   ? Platelets 128 (*)   ? All other components within normal limits  ?TROPONIN I (HIGH SENSITIVITY) - Abnormal; Notable for the following components:  ? Troponin I (High Sensitivity) 21 (*)   ? All other components within normal limits  ?TROPONIN I (HIGH SENSITIVITY) - Abnormal; Notable for the following components:  ? Troponin I (High Sensitivity) 20 (*)   ? All other components within normal limits  ?MAGNESIUM  ?TSH  ? ? ?EKG ?EKG Interpretation ? ?Date/Time:  Wednesday July 06 2021 22:15:54 EDT ?Ventricular Rate:  83 ?PR Interval:  184 ?QRS Duration: 184 ?QT Interval:  434 ?QTC Calculation: 509 ?R Axis:   -70 ?Text Interpretation: Sinus rhythm with Premature atrial complexes with Abberant conduction Right bundle branch block Left anterior fascicular block  Bifascicular block  Anterior infarct , age undetermined Abnormal ECG When compared with ECG of 15-Mar-2020 11:46, PREVIOUS ECG IS PRESENT Confirmed by Merrily Pew [phone]) on 07/06/2021 11:00:11 PM ? ?Radiology ?DG Chest Port 1 View ? ?Result Date: 07/06/2021 ?CLINICAL DATA:  AICD firing EXAM: PORTABLE CHEST 1 VIEW COMPARISON:  CT chest 04/25/2021  FINDINGS: Cardiac pacemaker. Power port type central venous catheter with tip over the low SVC region. Heart size and pulmonary vascularity are normal. Lungs are clear. No pleural effusions. No pneumothorax. Mediastinal contours appear intact. IMPRESSION: No evidence of active pulmonary disease. Electronically Signed   By: Lucienne Capers M.D.   On: 07/06/2021 22:37   ? ?Procedures ?Procedures  ? ? ?Medications Ordered in ED ?Medications - No data to display ? ?  ED Course/ Medical Decision Making/ A&P ?  ?                        ?Medical Decision Making ?Amount and/or Complexity of Data Reviewed ?Labs: ordered. ? ? ?Nurse spoke to [place] and it sounds like he was in rapid Afib when he was cardioverted. I reviewed his interrogation and it looks like he had rapid Afib with BBB. He did have one episode of ventricular beats lasting less than 10 beats. I can't discern where he was cardioverted. His magnesium and potassium are reassuring. His kindy function is a little bit diminished from baseline which likely accounts for his troponin numbers, but they are flat. Doubt ACS. Will fu w/ nephrology. Discussed briefly with cardiology, will plan for close outpatient follow up. Patient feels well. Stable for discharge.  ? ?Final Clinical Impression(s) / ED Diagnoses ?Final diagnoses:  ?None  ? ? ?Rx / DC Orders ?ED Discharge Orders   ? ? None  ? ?  ? ? ?  ?Merrily Pew, MD ?07/07/21 0126 ? ?

## 2021-07-07 ENCOUNTER — Other Ambulatory Visit: Payer: Self-pay

## 2021-07-07 DIAGNOSIS — Z20822 Contact with and (suspected) exposure to covid-19: Secondary | ICD-10-CM | POA: Diagnosis not present

## 2021-07-07 LAB — MAGNESIUM: Magnesium: 1.9 mg/dL (ref 1.7–2.4)

## 2021-07-07 LAB — TROPONIN I (HIGH SENSITIVITY): Troponin I (High Sensitivity): 20 ng/L — ABNORMAL HIGH (ref <18)

## 2021-07-09 DIAGNOSIS — Z20828 Contact with and (suspected) exposure to other viral communicable diseases: Secondary | ICD-10-CM | POA: Diagnosis not present

## 2021-07-12 ENCOUNTER — Other Ambulatory Visit: Payer: Self-pay | Admitting: *Deleted

## 2021-07-12 NOTE — Patient Outreach (Signed)
[facility]) Care Management ?Telephonic RN Care Manager Note ? ? ?07/13/2021 ?Name:  Jason Russell MRN:  [id number] DOB:  Nov 12, 1950 ? ?Summary: ?Successful initial outreach ? ?Review of recent ED visit for atrial fibrillation/AICD firing ?He reports he has only experienced two episodes of AICD firing-once in 2018 and on 07/06/21  ?He reports he had been fishing when this recent episode occurred  ?He reports a recent evaluation of his [facility] [employer] defibrillator appointment and he was made aware that the battery was functional for 8 more years ?Denies further firing of AICD or other symptoms of Afib since ED visit ? ?appointments ?Has not seen pcp yet but will make an appointment with pcp  ?To see Dr Harrington Challenger cardiology 07/21/21  ? ?Hypertension ?He is monitoring his BP at home today he was 129/65 ? ?Transition of care services noted to be completed by primary care MD office staff Dr Clayburn Pert office ?Transition of Care will be completed by primary care provider office who will refer to [facility] care management if needed. ? ? ?Recommendations/Changes made from today's visit: ?completed initial assessment ?Review of recent ED visit for atrial fibrillation/AICD firing ?Encouraged follow up appointment with pcp ?Discussed valsalva, defibrillator ?Encouraged rest ?Discussed use of a pulse oximeter ?Discussed follow up with RN CM  ?Sent EMMI education for vagal maneuvers and their responses, implantable cardioverter-defibrillators ? ?Subjective: ?Jason Russell is an 71 y.o. year old male who is a primary patient of Lucianne Lei, MD. The care management team was consulted for assistance with care management and/or care coordination needs.   ? ?Telephonic RN Care Manager completed Telephone Visit today.  ? ?Objective: ? ?Medications Reviewed Today   ? ? Reviewed by Curt Bears, MD (Physician) on 04/28/21 at 1148  Med List Status: <None>  ? ?Medication Order Taking? Sig Documenting Provider Last Dose Status Informant   ?albuterol (VENTOLIN HFA) 108 (90 Base) MCG/ACT inhaler [id number] No INHALE 2 PUFFS INTO THE LUNGS EVERY 4 HOURS AS NEEDED FOR WHEEZING OR SHORTNESS OF BREATH. Parrett, Fonnie Mu, NP Taking Active   ?amiodarone (PACERONE) 200 MG tablet [id number]  Take 0.5 tablets (100 mg total) by mouth See admin instructions. Take daily Except Saturday and Sunday Baldwin [place], [place]  Active   ?amLODipine (NORVASC) 5 MG tablet [id number] No Take 5 mg by mouth daily. [provider] Taking Active Self  ?atorvastatin (LIPITOR) 40 MG tablet [id number] No TAKE 1 TABLET BY MOUTH EVERY DAY Fay Records, MD Taking Active   ?carvedilol (COREG) 12.5 MG tablet [id number] No TAKE 1 TABLET BY MOUTH TWICE A DAY  ?Patient taking differently: Take 12.5 mg by mouth 2 (two) times daily with a meal.  ? Fay Records, MD Taking Active   ?CVS D3 2000 units CAPS [id number] No Take 2,000 Units by mouth daily.  [provider] Taking Active Self  ?ferrous gluconate (FERGON) 324 MG tablet [id number] No TAKE 1 TABLET (324 MG TOTAL) BY MOUTH 3 (THREE) TIMES DAILY WITH MEALS Deboraha Sprang, MD Taking Active   ?Fluticasone-Umeclidin-Vilant (TRELEGY ELLIPTA) 100-62.5-25 MCG/INH AEPB [id number] No Inhale 1 puff into the lungs daily. Parrett, Fonnie Mu, NP Taking Active   ?furosemide (LASIX) 40 MG tablet [id number] No Take 1 tablet (40 mg) every other day, alternating with 1/2 tablet (20 mg) every other day. Fay Records, MD Taking Active   ?Discontinued 04/28/21 1132 (Completed Course)   ?hydrALAZINE (APRESOLINE) 100 MG tablet [id number] No TAKE 1 TABLET BY MOUTH THREE TIMES A DAY Ross,  Carmin Muskrat, MD Taking Active   ?levothyroxine (SYNTHROID) 50 MCG tablet [id number] No TAKE 1 TABLET BY MOUTH EVERY DAY BEFORE BREAKFAST Fay Records, MD Taking Active   ?magnesium oxide (MAG-OX) 400 (241.3 Mg) MG tablet [id number] No Take 1 tablet (400 mg total) by mouth daily. Geradine Girt, DO Taking Active Self  ?Discontinued 04/28/21 1132 (Patient Preference)    ?Polyvinyl Alcohol-Povidone (REFRESH OP) [id number] No Place 1 drop into both eyes every morning. [provider] Taking Active Self  ?Potassium Chloride ER 20 MEQ TBCR [id number] No TAKE 1 TABLET BY MOUTH EVERY DAY Fay Records, MD Taking Active   ?sacubitril-valsartan (ENTRESTO) 97-103 MG [id number] No Take 1 tablet by mouth 2 (two) times daily. Fay Records, MD Taking Active Self  ?sildenafil (VIAGRA) 100 MG tablet [id number] No Take 1 tablet (100 mg total) by mouth daily as needed for erectile dysfunction. Fay Records, MD Taking Active Self  ?spironolactone (ALDACTONE) 25 MG tablet [id number] No Take 1 tablet (25 mg total) by mouth daily. Deboraha Sprang, MD Taking Active Self  ?ULORIC 40 MG tablet [id number] No Take 1 tablet (40 mg total) by mouth daily. Deboraha Sprang, MD Taking Active Self  ?XARELTO 20 MG TABS tablet [id number] No TAKE 1 TABLET BY MOUTH EVERY DAY  ?Patient taking differently: Take 20 mg by mouth daily with supper.  ? Fay Records, MD Taking Active Self  ? ?  ?  ? ?  ? ?Patient Active Problem List  ? Diagnosis Date Noted  ? Benign hypertension with chronic kidney disease, stage III ([place]) 12/14/2020  ? Status post right shoulder hemiarthroplasty 09/22/2020  ? AKI (acute kidney injury) ([place]) 08/11/2020  ? Pacemaker complications 01/25/8526  ? ICD (implantable cardioverter-defibrillator) malfunction 02/13/2018  ? CHF (congestive heart failure) ([place]) 01/11/2017  ? Depression 01/11/2017  ? Weight loss, non-intentional 01/08/2017  ? Defibrillator discharge 01/08/2017  ? Encounter for antineoplastic immunotherapy 11/27/2016  ? Chronic fatigue 10/18/2016  ? Port catheter in place 09/25/2016  ? Encounter for antineoplastic chemotherapy 08/24/2016  ? Goals of care, counseling/discussion 08/24/2016  ? Primary cancer of right upper lobe of lung ([place]) 08/23/2016  ? Lung nodule < 6cm on CT   ? Mediastinal adenopathy   ? Lung nodule 07/21/2016  ? Right rotator cuff tear 06/05/2016  ? Obesity  10/07/2015  ? COPD (chronic obstructive pulmonary disease) ([place]) 10/07/2015  ? Exertional dyspnea 10/07/2015  ? Anemia 10/29/2013  ? Internal hemorrhoids without mention of complication 78/24/2353  ? Iron deficiency anemia, unspecified 10/16/2013  ? Symptomatic anemia 10/15/2013  ? Coronary atherosclerosis 10/04/2012  ? Abnormal glucose 09/07/2012  ? Allergy 09/07/2012  ? Nonischemic cardiomyopathy ([place]) 08/16/2012  ? Shortness of breath dyspnea 11/01/2011  ? Chronic anticoagulation 11/01/2011  ? Chronic systolic heart failure ([place]) 11/01/2011  ? Obstructive sleep apnea 12/20/2009  ? Atrial fibrillation ([place]) 09/24/2009  ? HYPERPOTASSEMIA 08/04/2009  ? HYPOPOTASSEMIA 07/26/2009  ? Hyperlipidemia 07/15/2009  ? OVERWEIGHT/OBESITY 07/15/2009  ? TOBACCO ABUSE 07/15/2009  ? HYPERTENSION, BENIGN 07/15/2009  ? ? ? ?SDOH:  (Social Determinants of Health) assessments and interventions performed:  ? ? ?Care Plan ? ?Review of patient past medical history, allergies, medications, health status, including review of consultants reports, laboratory and other test data, was performed as part of comprehensive evaluation for care management services.  ? ?Care Plan : RN Care Manager Plan of Care  ?Updates made by Barbaraann Faster, RN since 07/13/2021 12:00 AM  ?  ? ?Problem: Complex Care  Coordination Needs and disease management in patient with atrial fibrillation   ?Priority: High  ?Onset Date: 07/12/2021  ?  ? ?Long-Range Goal: Establish Plan of Care for Management Complex SDOH Barriers, disease management and Care Coordination Needs in patient with atrial fibrillation   ?Start Date: 07/12/2021  ?This Visit's Progress: On track  ?Priority: High  ?Note:   ?Current Barriers:  ?Knowledge Deficits related to plan of care for management of Atrial Fibrillation  ?Care Coordination needs related to Limited education about atrial fibrillation* ? ?RN CM Clinical Goal(s):  ?Patient will verbalize understanding of plan for management of Atrial  Fibrillation as evidenced by verbalized home management interventions  through collaboration with RN Care manager, provider, and care team.  ? ?Interventions: ?Outreaches for care coordination, disease managemen

## 2021-07-18 ENCOUNTER — Ambulatory Visit (INDEPENDENT_AMBULATORY_CARE_PROVIDER_SITE_OTHER): Payer: Medicare Other

## 2021-07-18 DIAGNOSIS — I428 Other cardiomyopathies: Secondary | ICD-10-CM | POA: Diagnosis not present

## 2021-07-19 ENCOUNTER — Telehealth: Payer: Self-pay

## 2021-07-19 ENCOUNTER — Encounter: Payer: Self-pay | Admitting: Internal Medicine

## 2021-07-19 LAB — CUP PACEART REMOTE DEVICE CHECK
Battery Remaining Longevity: 73 mo
Battery Remaining Percentage: 68 %
Battery Voltage: 2.99 V
Brady Statistic RV Percent Paced: 1 %
Date Time Interrogation Session: 20230417020026
HighPow Impedance: 42 Ohm
HighPow Impedance: 43 Ohm
Implantable Lead Implant Date: 20140703
Implantable Lead Location: 753860
Implantable Pulse Generator Implant Date: 20191113
Lead Channel Impedance Value: 430 Ohm
Lead Channel Pacing Threshold Amplitude: 0.75 V
Lead Channel Pacing Threshold Pulse Width: 0.5 ms
Lead Channel Sensing Intrinsic Amplitude: 4.4 mV
Lead Channel Setting Pacing Amplitude: 2.5 V
Lead Channel Setting Pacing Pulse Width: 0.5 ms
Lead Channel Setting Sensing Sensitivity: 0.5 mV
Pulse Gen Serial Number: 9820959

## 2021-07-19 MED ORDER — BISOPROLOL FUMARATE 5 MG PO TABS: 5.0000 mg | ORAL_TABLET | Freq: Every day | ORAL | 11 refills | Status: DC

## 2021-07-19 MED ORDER — BISOPROLOL FUMARATE 5 MG PO TABS
5.0000 mg | ORAL_TABLET | Freq: Every day | ORAL | 11 refills | Status: DC
Start: 2021-07-19 — End: 2022-06-02

## 2021-07-19 NOTE — Telephone Encounter (Signed)
Left message requesting call back. ? ?Per Dr. Caryl Comes- stop carvedilol.  Start bisoprolol 5 mg PO daily.  Prescription sent. ? ?See GT to discuss further tx options.  Doylene Canning will call to schedule. ? ?Need to confirm Pt received message. ?

## 2021-07-19 NOTE — Addendum Note (Signed)
Addended by: Willeen Cass A on: 07/19/2021 03:06 PM ? ? Modules accepted: Orders ? ?

## 2021-07-19 NOTE — Telephone Encounter (Signed)
Transmission received 07/19/2021. ?

## 2021-07-19 NOTE — Progress Notes (Unsigned)
Patient was shocked for what appears to be sustained repetitive monomorphic ventricular tachycardia.  ECGs were reviewed from earlier this year with a PVC morphology was left bundle inferior axis with a transition in V3-V4.  We will change his beta-blocker as these events occurred in the context of sinus tachycardia not withstanding his carvedilol 12.5 and put him on bisoprolol 5.  We will also arrange for him to see Dr. Elliot Cousin for consideration of catheter ablation of his RVOT VT ?

## 2021-07-19 NOTE — Telephone Encounter (Signed)
Patient returned phone call and made aware of prescription changes. Also made aware scheduler will call to make in-clinic apt. Voiced understanding. ?

## 2021-07-19 NOTE — Telephone Encounter (Signed)
I spoke with the patient and he agreed to send a transmission today. ?

## 2021-07-20 NOTE — Progress Notes (Signed)
? ?Cardiology Office Note ? ? ?Date:  07/23/2021  ? ?ID:  Jason Russell, DOB [birth year], MRN [id number] ? ?PCP:  Jason Lei, MD  ?Cardiologist:   Jason Carnes, MD  ? ?F/U of systolic CHF and atrial fibrillation   ?  ?History of Present Illness: ?  ?Jason Russell is a 71 y.o. male with hx of nonobstructive CAD (cath 2014), HTN, OSA,lung CA (s/p XRT and chemo), PAF (on amiodarone), chronic systolic CHF  Last echo in Feb 2020 LVEF 35 to 40%     Pt is s/p ICD implant in 2014 for primary prevention.   ?I saw the pt back in Aug 2021   Since then he was seen by Jason Russell in Jan 2022 ? ?The pt denies palpitations   He denies CP   Breathing is OK ?Eager to lose wt ?He also reports that his wife died earlier this winter  ? ?I saw the pt in clinic in March 2022   He has been seein EP since  ?Overall he says he is doing good   Denies CP   Breathing is good   no palpitations   ? ?Current Meds  ?Medication Sig  ? albuterol (VENTOLIN HFA) 108 (90 Base) MCG/ACT inhaler INHALE 2 PUFFS INTO THE LUNGS EVERY 4 HOURS AS NEEDED FOR WHEEZING OR SHORTNESS OF BREATH.  ? amiodarone (PACERONE) 200 MG tablet Take 0.5 tablets (100 mg total) by mouth See admin instructions. Take daily Except Saturday and Sunday  ? amLODipine (NORVASC) 5 MG tablet Take 5 mg by mouth daily.  ? atorvastatin (LIPITOR) 40 MG tablet TAKE 1 TABLET BY MOUTH EVERY DAY  ? bisoprolol (ZEBETA) 5 MG tablet Take 1 tablet (5 mg total) by mouth daily.  ? [employer]S D3 2000 units CAPS Take 2,000 Units by mouth daily.   ? ferrous gluconate (FERGON) 324 MG tablet TAKE 1 TABLET (324 MG TOTAL) BY MOUTH 3 (THREE) TIMES DAILY WITH MEALS  ? Fluticasone-Umeclidin-Vilant (TRELEGY ELLIPTA) 100-62.5-25 MCG/INH AEPB Inhale 1 puff into the lungs daily.  ? furosemide (LASIX) 40 MG tablet Take 1 tablet (40 mg) every other day, alternating with 1/2 tablet (20 mg) every other day.  ? hydrALAZINE (APRESOLINE) 100 MG tablet TAKE 1 TABLET BY MOUTH THREE TIMES A DAY  ? levothyroxine (SYNTHROID) 50 MCG  tablet TAKE 1 TABLET BY MOUTH EVERY DAY BEFORE BREAKFAST  ? magnesium oxide (MAG-OX) 400 (241.3 Mg) MG tablet Take 1 tablet (400 mg total) by mouth daily.  ? Polyvinyl Alcohol-Povidone (REFRESH OP) Place 1 drop into both eyes every morning.  ? Potassium Chloride ER 20 MEQ TBCR TAKE 1 TABLET BY MOUTH EVERY DAY  ? rivaroxaban (XARELTO) 20 MG TABS tablet TAKE 1 TABLET BY MOUTH EVERY DAY  ? sacubitril-valsartan (ENTRESTO) 97-103 MG Take 1 tablet by mouth 2 (two) times daily.  ? spironolactone (ALDACTONE) 25 MG tablet Take 1 tablet (25 mg total) by mouth daily.  ? ULORIC 40 MG tablet Take 1 tablet (40 mg total) by mouth daily.  ? [DISCONTINUED] sildenafil (VIAGRA) 100 MG tablet Take 1 tablet (100 mg total) by mouth daily as needed for erectile dysfunction.  ? ? ? ?Allergies:   Patient has no known allergies.  ? ?Past Medical History:  ?Diagnosis Date  ? Adenocarcinoma of right lung, stage 3 ([place]) 08/23/2016  ? Anemia   ? Arthritis   ? "hx right hip"  ? Asthma   ? "when I was a child"  ? Atrial fibrillation ([place])   ? Amiodarone started 10/2011;  Coumadin  ? Automatic implantable cardioverter-defibrillator in situ 10/03/2012  ? a. St. Jude ICD implantation 10/03/12.  ? Chronic anticoagulation   ? Chronic fatigue 10/18/2016  ? Chronic systolic heart failure ([place])   ? a. Echo 7/13: EF 25%;  b. echo 04/2012:  Mild LVH, EF 30-35%, Gr 1 DD, mild AI, mild MR, mild LAE  ? COPD (chronic obstructive pulmonary disease) ([place])   ? Dyslipidemia   ? Gout   ? History of blood transfusion 10/15/2013  ? "don't know where the blood's going; HgB down to 5"  ? Hyperlipidemia   ? Hypertension   ? Hypothyroidism   ? NICM (nonischemic cardiomyopathy) ([place])   ? LHC 4/14:  minimal CAD  ? Obesity   ? OSA on CPAP   ? Tobacco abuse   ? ? ?Past Surgical History:  ?Procedure Laterality Date  ? CARDIAC DEFIBRILLATOR PLACEMENT  2014  ? CARDIOVERSION  2011  ? CARDIOVERSION N/A 03/15/2020  ? Procedure: CARDIOVERSION;  Surgeon: Fay Records, MD;  Location: [place];  Service: Cardiovascular;  Laterality: N/A;  ? COLONOSCOPY WITH PROPOFOL Left 10/17/2013  ? Procedure: COLONOSCOPY WITH PROPOFOL;  Surgeon: Inda Castle, MD;  Location: [place];  Service: Endoscopy;  Laterality: Left;  ? ESOPHAGOGASTRODUODENOSCOPY N/A 10/17/2013  ? Procedure: ESOPHAGOGASTRODUODENOSCOPY (EGD);  Surgeon: Inda Castle, MD;  Location: [place];  Service: Endoscopy;  Laterality: N/A;  ? ESOPHAGOGASTRODUODENOSCOPY (EGD) WITH PROPOFOL N/A 06/14/2018  ? Procedure: ESOPHAGOGASTRODUODENOSCOPY (EGD) WITH PROPOFOL;  Surgeon: Doran Stabler, MD;  Location: [facility];  Service: Gastroenterology;  Laterality: N/A;  ? GIVENS CAPSULE STUDY N/A 10/29/2013  ? Procedure: GIVENS CAPSULE STUDY;  Surgeon: Inda Castle, MD;  Location: [facility];  Service: Endoscopy;  Laterality: N/A;  ? ICD GENERATOR CHANGEOUT N/A 02/13/2018  ? Procedure: ICD GENERATOR CHANGEOUT;  Surgeon: Constance Haw, MD;  Location: [place] CV LAB;  Service: Cardiovascular;  Laterality: N/A;  ? IMPLANTABLE CARDIOVERTER DEFIBRILLATOR IMPLANT Left 10/03/2012  ? Procedure: IMPLANTABLE CARDIOVERTER DEFIBRILLATOR IMPLANT;  Surgeon: Deboraha Sprang, MD;  Location: [facility] CATH LAB;  Service: Cardiovascular;  Laterality: Left;  ? IR FLUORO GUIDE PORT INSERTION RIGHT  09/21/2016  ? IR US GUIDE VASC ACCESS RIGHT  09/21/2016  ? JOINT REPLACEMENT    ? REVERSE SHOULDER ARTHROPLASTY Right 09/22/2020  ? Procedure: SHOULDER HEMI ARTHROPLASTY;  Surgeon: Hiram Gash, MD;  Location: [facility];  Service: Orthopedics;  Laterality: Right;  ? SAVORY DILATION N/A 06/14/2018  ? Procedure: SAVORY DILATION;  Surgeon: Doran Stabler, MD;  Location: [facility];  Service: Gastroenterology;  Laterality: N/A;  ? TONSILLECTOMY  1950's  ? TOTAL HIP ARTHROPLASTY Right 11/25/1997  ? VIDEO BRONCHOSCOPY WITH ENDOBRONCHIAL ULTRASOUND N/A 08/09/2016  ? Procedure: VIDEO BRONCHOSCOPY WITH ENDOBRONCHIAL ULTRASOUND;  Surgeon: Javier Glazier, MD;  Location:  [place];  Service: Thoracic;  Laterality: N/A;  ? ? ? ?Social History:  The patient  reports that he quit smoking about 5 years ago. His smoking use included cigarettes. He has a 11.25 pack-year smoking history. He has never used smokeless tobacco. He reports current alcohol use of about 1.0 standard drink per week. He reports that he does not use drugs.  ? ?Family History:  The patient's family history includes Heart Problems in his mother; Hypertension in his mother; Lung cancer in his brother; Other in his father.  ? ? ?ROS:  Please see the history of present illness. All other systems are reviewed and  Negative to the  above problem except as noted.  ? ? ?PHYSICAL EXAM: ?VS:  BP 140/72   Pulse (!) 51   Ht 6\' 1"  (1.854 m)   Wt 289 lb (131.1 kg)   SpO2 98%   BMI 38.13 kg/m?   ?GEN: Morbidly obese 71 yo  in no acute distress  ?HEENT: normal  ?Neck: no JVD ?Cardiac: RRR; no murmurs, Tr - 1+LE edema  ?Respiratory:  clear to auscultation  ?GI: soft, nontender  Obese   ?MS: no deformity Moving all extremities  ?Skin: warm and dry, no rash ?Neuro:  Strength and sensation are intact ?Psych: euthymic mood, full affect ? ? ?EKG:  EKG is not ordered today.   ? ?ECHO:   05/24/18 ? ? 1. The left ventricle has moderately reduced systolic function, with an  ?ejection fraction of 35-40%. The cavity size was normal. There is mildly  ?increased left ventricular wall thickness. Left ventricular diastolic  ?Doppler parameters are consistent with  ? impaired relaxation.  ? 2. The right ventricle has mildly reduced systolic function. The cavity  ?was small. There is no increase in right ventricular wall thickness.  ? 3. The mitral valve is normal in structure. Mild thickening of the mitral  ?valve leaflet.  ? 4. The tricuspid valve is normal in structure.  ? 5. The aortic valve is normal in structure. Aortic valve regurgitation is  ?mild to moderate by color flow Doppler.  ? 6. The interatrial septum was not assessed.  ? ?Lipid  Panel ?   ?Component Value Date/Time  ? CHOL 117 01/28/2020 0927  ? TRIG 71 01/28/2020 0927  ? HDL 31 (L) 01/28/2020 0927  ? CHOLHDL 3.8 01/28/2020 0927  ? CHOLHDL 3 12/23/2012 1205  ? VLDL 14.6 12/23/2012 1205  ?

## 2021-07-21 ENCOUNTER — Ambulatory Visit (INDEPENDENT_AMBULATORY_CARE_PROVIDER_SITE_OTHER): Payer: Medicare Other | Admitting: Internal Medicine

## 2021-07-21 ENCOUNTER — Encounter: Payer: Self-pay | Admitting: Internal Medicine

## 2021-07-21 VITALS — BP 140/72 | HR 51 | Ht 73.0 in | Wt 289.0 lb

## 2021-07-21 DIAGNOSIS — I5022 Chronic systolic (congestive) heart failure: Secondary | ICD-10-CM

## 2021-07-21 DIAGNOSIS — Z79899 Other long term (current) drug therapy: Secondary | ICD-10-CM

## 2021-07-21 LAB — BASIC METABOLIC PANEL
BUN/Creatinine Ratio: 15 (ref 10–24)
BUN: 27 mg/dL (ref 8–27)
CO2: 22 mmol/L (ref 20–29)
Calcium: 9.1 mg/dL (ref 8.6–10.2)
Chloride: 107 mmol/L — ABNORMAL HIGH (ref 96–106)
Creatinine, Ser: 1.81 mg/dL — ABNORMAL HIGH (ref 0.76–1.27)
Glucose: 88 mg/dL (ref 70–99)
Potassium: 4.4 mmol/L (ref 3.5–5.2)
Sodium: 141 mmol/L (ref 134–144)
eGFR: 40 mL/min/{1.73_m2} — ABNORMAL LOW (ref >59)

## 2021-07-21 MED ORDER — SILDENAFIL CITRATE 100 MG PO TABS: 100.0000 mg | ORAL_TABLET | Freq: Every day | ORAL | 1 refills | Status: DC | PRN

## 2021-07-21 NOTE — Patient Instructions (Signed)
Medication Instructions:  ? ?*If you need a refill on your cardiac medications before your next appointment, please call your pharmacy* ? ? ?Lab Work: ?BMET today  ?If you have labs (blood work) drawn today and your tests are completely normal, you will receive your results only by: ?MyChart Message (if you have MyChart) OR ?A paper copy in the mail ?If you have any lab test that is abnormal or we need to change your treatment, we will call you to review the results. ? ? ?Testing/Procedures: ? ? ? ?Follow-Up: ?At [facility], you and your health needs are our priority.  As part of our continuing mission to provide you with exceptional heart care, we have created designated Provider Care Teams.  These Care Teams include your primary Cardiologist (physician) and Advanced Practice Providers (APPs -  Physician Assistants and Nurse Practitioners) who all work together to provide you with the care you need, when you need it. ? ?We recommend signing up for the patient portal called "MyChart".  Sign up information is provided on this After Visit Summary.  MyChart is used to connect with patients for Virtual Visits (Telemedicine).  Patients are able to view lab/test results, encounter notes, upcoming appointments, etc.  Non-urgent messages can be sent to your provider as well.   ?To learn more about what you can do with MyChart, go to [email or web address].   ? ?Your next appointment:   ?8 month(s) ? ?The format for your next appointment:   ?In Person ? ?Provider:   ?Dorris Carnes, MD   ? ? ?Other Instructions ? ? ?Important Information About Sugar ? ? ? ? ?  ?

## 2021-07-22 ENCOUNTER — Other Ambulatory Visit: Payer: Self-pay | Admitting: *Deleted

## 2021-07-22 NOTE — Patient Outreach (Signed)
[facility]) Care Management ?Telephonic RN Care Manager Note ? ? ?07/28/2021 ?Name:  Jason Russell MRN:  [id number] DOB:  May 21, 1950 ? ?Summary: ?Successful outreach to follow up ?Jason Russell is doing well  ?He has met with his cardiologist and will have a cardiac procedure on 08/19/21  ?He has not had any further ICD firing and is reported to be able to return to regular activities including fishing by his cardiologist ?He has received EMMI education but items are not accessible as he informs RN CM when guided during this outreach ?Items to be mailed to him ?He has received [facility] welcome package  ?He denies any issues or worsening symptoms today ? ?Recommendations/Changes made from today's visit: ?Follow up outreach to assess worsening symptoms, needs ?Confirmed and answered questions about [facility] education and welcome package ? ? ?Subjective: ?Jason Russell is an 71 y.o. year old male who is a primary patient of Lucianne Lei, MD. The care management team was consulted for assistance with care management and/or care coordination needs.   ? ?Telephonic RN Care Manager completed Telephone Visit today.  ? ?Objective: ? ?Medications Reviewed Today   ? ? Reviewed by Barbaraann Faster, RN (Registered Nurse) on 07/22/21 at 37  Med List Status: <None>  ? ?Medication Order Taking? Sig Documenting Provider Last Dose Status Informant  ?albuterol (VENTOLIN HFA) 108 (90 Base) MCG/ACT inhaler [id number]  INHALE 2 PUFFS INTO THE LUNGS EVERY 4 HOURS AS NEEDED FOR WHEEZING OR SHORTNESS OF BREATH. Parrett, Fonnie Mu, NP  Active   ?amiodarone (PACERONE) 200 MG tablet [id number]  Take 0.5 tablets (100 mg total) by mouth See admin instructions. Take daily Except Saturday and Sunday [place], [place]  Active   ?amLODipine (NORVASC) 5 MG tablet [id number]  Take 5 mg by mouth daily. [provider]  Active Self  ?atorvastatin (LIPITOR) 40 MG tablet [id number]  TAKE 1 TABLET BY MOUTH EVERY DAY Fay Records, MD   Active   ?bisoprolol (ZEBETA) 5 MG tablet [id number]  Take 1 tablet (5 mg total) by mouth daily. Deboraha Sprang, MD  Active   ?CVS D3 2000 units CAPS [id number]  Take 2,000 Units by mouth daily.  [provider]  Active Self  ?ferrous gluconate (FERGON) 324 MG tablet [id number]  TAKE 1 TABLET (324 MG TOTAL) BY MOUTH 3 (THREE) TIMES DAILY WITH MEALS Deboraha Sprang, MD  Active   ?Fluticasone-Umeclidin-Vilant (TRELEGY ELLIPTA) 100-62.5-25 MCG/INH AEPB [id number]  Inhale 1 puff into the lungs daily. Parrett, Fonnie Mu, NP  Active   ?furosemide (LASIX) 40 MG tablet [id number]  Take 1 tablet (40 mg) every other day, alternating with 1/2 tablet (20 mg) every other day. Fay Records, MD  Active   ?hydrALAZINE (APRESOLINE) 100 MG tablet [id number]  TAKE 1 TABLET BY MOUTH THREE TIMES A DAY Fay Records, MD  Active   ?levothyroxine (SYNTHROID) 50 MCG tablet [id number]  TAKE 1 TABLET BY MOUTH EVERY DAY BEFORE BREAKFAST Fay Records, MD  Active   ?magnesium oxide (MAG-OX) 400 (241.3 Mg) MG tablet [id number]  Take 1 tablet (400 mg total) by mouth daily. Geradine Girt, DO  Active Self  ?Polyvinyl Alcohol-Povidone (REFRESH OP) [id number]  Place 1 drop into both eyes every morning. [provider]  Active Self  ?Potassium Chloride ER 20 MEQ TBCR [id number]  TAKE 1 TABLET BY MOUTH EVERY DAY Fay Records, MD  Active   ?rivaroxaban (XARELTO) 20 MG TABS tablet [id number]  TAKE 1 TABLET BY  MOUTH EVERY DAY Fay Records, MD  Active   ?sacubitril-valsartan (ENTRESTO) 97-103 MG [id number]  Take 1 tablet by mouth 2 (two) times daily. Fay Records, MD  Active Self  ?sildenafil (VIAGRA) 100 MG tablet [id number]  Take 1 tablet (100 mg total) by mouth daily as needed for erectile dysfunction. Fay Records, MD  Active   ?spironolactone (ALDACTONE) 25 MG tablet [id number]  Take 1 tablet (25 mg total) by mouth daily. Deboraha Sprang, MD  Active   ?ULORIC 40 MG tablet [id number]  Take 1 tablet (40 mg total) by mouth daily. Deboraha Sprang, MD  Active Self  ? ?  ?  ? ?  ? ? ? ?SDOH:  (Social Determinants of Health) assessments and interventions performed:  ?SDOH Interventions   ? ?Flowsheet Row Most Recent Value  ?SDOH Interventions   ?Food Insecurity Interventions Intervention Not Indicated  ?Financial Strain Interventions Intervention Not Indicated  ?Housing Interventions Intervention Not Indicated  ?Intimate Partner Violence Interventions Intervention Not Indicated  ?Stress Interventions Intervention Not Indicated  ? ?  ? ? ?Care Plan ? ?Review of patient past medical history, allergies, medications, health status, including review of consultants reports, laboratory and other test data, was performed as part of comprehensive evaluation for care management services.  ? ?Care Plan : RN Care Manager Plan of Care  ?Updates made by Barbaraann Faster, RN since 07/28/2021 12:00 AM  ?  ? ?Problem: Complex Care Coordination Needs and disease management in patient with atrial fibrillation   ?Priority: High  ?Onset Date: 07/12/2021  ?  ? ?Long-Range Goal: Establish Plan of Care for Management Complex SDOH Barriers, disease management and Care Coordination Needs in patient with atrial fibrillation   ?Start Date: 07/12/2021  ?This Visit's Progress: On track  ?Recent Progress: On track  ?Priority: High  ?Note:   ?Current Barriers:  ?Knowledge Deficits related to plan of care for management of Atrial Fibrillation  ?Care Coordination needs related to Limited education about atrial fibrillation* ? ?RN CM Clinical Goal(s):  ?Patient will verbalize understanding of plan for management of Atrial Fibrillation as evidenced by verbalized home management interventions  through collaboration with RN Care manager, provider, and care team.  ? ?Interventions: ?Outreaches for care coordination, disease management, resources, home care/education needs ?Inter-disciplinary care team collaboration (see longitudinal plan of care) ?Evaluation of current treatment plan related to   self management and patient's adherence to plan as established by provider ? ? ?AFIB Interventions: (Status:  Goal on track:  Yes.) Long Term Goal ?07/22/21 doing well  ?He has met with his cardiologist and will have a cardiac procedure on 08/19/21  ?  Reviewed importance of adherence to anticoagulant exactly as prescribed ?Counseled on importance of regular laboratory monitoring as prescribed ?Afib action plan reviewed ?Screening for signs and symptoms of depression related to chronic disease state  ?Assessed social determinant of health barriers ?Review of recent ED visit for atrial fibrillation/AICD firing Encouraged hospital follow up with pcp and specialist.  Discussed valsalva, defibrillator Discussed use of a pulse oximeter Sent EMMI education for vagal maneuvers and their responses, implantable cardioverter-defibrillators ? ?Patient Goals/Self-Care Activities: ?Take all medications as prescribed ?Attend all scheduled provider appointments ?Attend church or other social activities ?Perform all self care activities independently  ?Perform IADL's (shopping, preparing meals, housekeeping, managing finances) independently ?Call provider office for new concerns or questions  ? ?Follow Up Plan:  The patient has been provided with contact information for the care management team and has  been advised to call with any health related questions or concerns.  ?The care management team will reach out to the patient again over the next 30+ business days. ? ?  ?  ? ?Plan: The patient has been provided with contact information for the care management team and has been advised to call with any health related questions or concerns.  ?The care management team will reach out to the patient again over the next 30+ business days. ? ?Jessabelle Markiewicz L. Lavina Hamman, RN, BSN, CCM ?[facility] Management Care Coordinator ?Office number [phone] ?[facility] number [phone] ?Fax number [phone] ? ? ? ? ? ?

## 2021-07-26 DIAGNOSIS — Z20822 Contact with and (suspected) exposure to covid-19: Secondary | ICD-10-CM | POA: Diagnosis not present

## 2021-08-03 ENCOUNTER — Encounter: Payer: Medicare Other | Admitting: Physician Assistant

## 2021-08-03 DIAGNOSIS — Z20822 Contact with and (suspected) exposure to covid-19: Secondary | ICD-10-CM | POA: Diagnosis not present

## 2021-08-04 NOTE — Progress Notes (Signed)
Remote ICD transmission.   

## 2021-08-05 ENCOUNTER — Other Ambulatory Visit: Payer: Self-pay | Admitting: Internal Medicine

## 2021-08-06 DIAGNOSIS — Z23 Encounter for immunization: Secondary | ICD-10-CM | POA: Diagnosis not present

## 2021-08-08 ENCOUNTER — Ambulatory Visit (INDEPENDENT_AMBULATORY_CARE_PROVIDER_SITE_OTHER): Payer: Medicare Other

## 2021-08-08 DIAGNOSIS — I5022 Chronic systolic (congestive) heart failure: Secondary | ICD-10-CM | POA: Diagnosis not present

## 2021-08-08 DIAGNOSIS — Z95810 Presence of automatic (implantable) cardiac defibrillator: Secondary | ICD-10-CM

## 2021-08-09 DIAGNOSIS — Z20822 Contact with and (suspected) exposure to covid-19: Secondary | ICD-10-CM | POA: Diagnosis not present

## 2021-08-10 DIAGNOSIS — Z20822 Contact with and (suspected) exposure to covid-19: Secondary | ICD-10-CM | POA: Diagnosis not present

## 2021-08-11 ENCOUNTER — Other Ambulatory Visit: Payer: Self-pay | Admitting: Internal Medicine

## 2021-08-11 NOTE — Progress Notes (Signed)
EPIC Encounter for ICM Monitoring ? ?Patient Name: Jason Russell is a 71 y.o. male ?Date: 08/11/2021 ?Primary Care Physican: Lucianne Lei, MD ?Primary Cardiologist: Harrington Challenger ?Electrophysiologist: Caryl Comes ?09/12/2021 Weight: 280 lbs  ? ?  ?Spoke with patient and heart failure questions reviewed.  Pt asymptomatic for fluid accumulation.  ?  ?CorVue Thoracic impedance suggesting normal fluid levels.   ?  ?Prescribed:  ?Furosemide 40 mg take 0.5 tablet (20 mg total) by mouth every other day alternating with 1 tablet (40 mg total) every other day.  ?Potassium 20 mEq 1 tablet daily. ?Spironolactone 25 mg take 1 tablet daily ?  ?Labs: ?07/21/2021 Creatinine 1.81, BUN 27, Potassium 4.4, Sodium 141, GFR 40 ?07/06/2021 Creatinine 2.25, BUN 27, Potassium 3.9, Sodium 140 ?06/08/2021 Creatinine 2.09, BUN 30, Potassium 4.0, Sodium 140, GFR 34 ?04/25/2021 Creatinine 2.00, BUN 36, Potassium 4.0, Sodium 140, GFR 35 ?12/10/2020 Creatinine 1.66, BUN 18, Potassium 3.7, Sodium 142, GFR 44 ?10/19/2020 Creatinine 1.77, BUN 23, Potassium 3.7, Sodium 142 ?A complete set of results can be found in results review ?  ?Recommendations:   No changes and encouraged to call if experiencing any fluid symptoms. ?  ?Follow-up plan: ICM clinic phone appointment on 09/12/2021.   91 day device clinic remote transmission 10/17/2021.   ?  ?EP/Cardiology Office Visits:   08/19/2021 with Dr Lovena Le. ?  ?Copy of ICM check sent to Dr. Caryl Comes.   ? ?3 month ICM trend: 08/08/2021. ? ? ? ?12-14 Month ICM trend:  ? ? ? ?[name], RN ?08/11/2021 ?1:49 PM ? ?

## 2021-08-19 ENCOUNTER — Ambulatory Visit (INDEPENDENT_AMBULATORY_CARE_PROVIDER_SITE_OTHER): Payer: Medicare Other | Admitting: Internal Medicine

## 2021-08-19 ENCOUNTER — Encounter: Payer: Self-pay | Admitting: Internal Medicine

## 2021-08-19 VITALS — BP 124/68 | HR 51 | Ht 73.0 in | Wt 285.0 lb

## 2021-08-19 DIAGNOSIS — I472 Ventricular tachycardia, unspecified: Secondary | ICD-10-CM

## 2021-08-19 DIAGNOSIS — I5022 Chronic systolic (congestive) heart failure: Secondary | ICD-10-CM

## 2021-08-19 DIAGNOSIS — I480 Paroxysmal atrial fibrillation: Secondary | ICD-10-CM | POA: Diagnosis not present

## 2021-08-19 DIAGNOSIS — Z95810 Presence of automatic (implantable) cardiac defibrillator: Secondary | ICD-10-CM | POA: Insufficient documentation

## 2021-08-19 DIAGNOSIS — I429 Cardiomyopathy, unspecified: Secondary | ICD-10-CM

## 2021-08-19 LAB — CBC WITH DIFFERENTIAL/PLATELET
Basophils Absolute: 0 10*3/uL (ref 0.0–0.2)
Basos: 1 %
EOS (ABSOLUTE): 0.1 10*3/uL (ref 0.0–0.4)
Eos: 3 %
Hematocrit: 39.1 % (ref 37.5–51.0)
Hemoglobin: 12.8 g/dL — ABNORMAL LOW (ref 13.0–17.7)
Immature Grans (Abs): 0 10*3/uL (ref 0.0–0.1)
Immature Granulocytes: 1 %
Lymphocytes Absolute: 0.8 10*3/uL (ref 0.7–3.1)
Lymphs: 18 %
MCH: 29.8 pg (ref 26.6–33.0)
MCHC: 32.7 g/dL (ref 31.5–35.7)
MCV: 91 fL (ref 79–97)
Monocytes Absolute: 0.7 10*3/uL (ref 0.1–0.9)
Monocytes: 15 %
Neutrophils Absolute: 2.7 10*3/uL (ref 1.4–7.0)
Neutrophils: 62 %
Platelets: 138 10*3/uL — ABNORMAL LOW (ref 150–450)
RBC: 4.29 x10E6/uL (ref 4.14–5.80)
RDW: 12.8 % (ref 11.6–15.4)
WBC: 4.3 10*3/uL (ref 3.4–10.8)

## 2021-08-19 LAB — BASIC METABOLIC PANEL
BUN/Creatinine Ratio: 13 (ref 10–24)
BUN: 24 mg/dL (ref 8–27)
CO2: 22 mmol/L (ref 20–29)
Calcium: 9.2 mg/dL (ref 8.6–10.2)
Chloride: 106 mmol/L (ref 96–106)
Creatinine, Ser: 1.81 mg/dL — ABNORMAL HIGH (ref 0.76–1.27)
Glucose: 90 mg/dL (ref 70–99)
Potassium: 3.8 mmol/L (ref 3.5–5.2)
Sodium: 140 mmol/L (ref 134–144)
eGFR: 40 mL/min/{1.73_m2} — ABNORMAL LOW (ref >59)

## 2021-08-19 NOTE — H&P (View-Only) (Signed)
HPI Jason Russell is referred by Dr,. SK to consider VT ablation. He is a pleasant 71 yo man with a h/o NICM, EF 35%, s/p ICD insertion who has had a couple of episodes of ICD shock, most recently a month ago. He did not have a warning that he was about to get shocked except he notes that he felt weak prior to the shock. He had been fishinng all day. The patient denies worsening chest pain or sob. No edema.  No Known Allergies   Current Outpatient Medications  Medication Sig Dispense Refill   albuterol (VENTOLIN HFA) 108 (90 Base) MCG/ACT inhaler INHALE 2 PUFFS INTO THE LUNGS EVERY 4 HOURS AS NEEDED FOR WHEEZING OR SHORTNESS OF BREATH. 8.5 g 5   amiodarone (PACERONE) 200 MG tablet Take 0.5 tablets (100 mg total) by mouth See admin instructions. Take daily Except Saturday and Sunday 45 tablet 3   amLODipine (NORVASC) 5 MG tablet Take 5 mg by mouth daily.     atorvastatin (LIPITOR) 40 MG tablet TAKE 1 TABLET BY MOUTH EVERY DAY 90 tablet 3   bisoprolol (ZEBETA) 5 MG tablet Take 1 tablet (5 mg total) by mouth daily. 30 tablet 11   CVS D3 2000 units CAPS Take 2,000 Units by mouth daily.   11   ENTRESTO 97-103 MG TAKE 1 TABLET BY MOUTH TWICE A DAY 180 tablet 2   ferrous gluconate (FERGON) 324 MG tablet TAKE 1 TABLET (324 MG TOTAL) BY MOUTH 3 (THREE) TIMES DAILY WITH MEALS 270 tablet 1   Fluticasone-Umeclidin-Vilant (TRELEGY ELLIPTA) 100-62.5-25 MCG/INH AEPB Inhale 1 puff into the lungs daily. 3 each 3   furosemide (LASIX) 40 MG tablet Take 1 tablet (40 mg) every other day, alternating with 1/2 tablet (20 mg) every other day. 90 tablet 3   hydrALAZINE (APRESOLINE) 100 MG tablet TAKE 1 TABLET BY MOUTH THREE TIMES A DAY 270 tablet 3   levothyroxine (SYNTHROID) 50 MCG tablet TAKE 1 TABLET BY MOUTH EVERY DAY BEFORE BREAKFAST 90 tablet 3   magnesium oxide (MAG-OX) 400 (241.3 Mg) MG tablet Take 1 tablet (400 mg total) by mouth daily. 30 tablet 0   Polyvinyl Alcohol-Povidone (REFRESH OP) Place 1 drop  into both eyes every morning.     Potassium Chloride ER 20 MEQ TBCR TAKE 1 TABLET BY MOUTH EVERY DAY 90 tablet 3   rivaroxaban (XARELTO) 20 MG TABS tablet TAKE 1 TABLET BY MOUTH EVERY DAY 90 tablet 1   sildenafil (VIAGRA) 100 MG tablet Take 1 tablet (100 mg total) by mouth daily as needed for erectile dysfunction. 30 tablet 1   spironolactone (ALDACTONE) 25 MG tablet Take 1 tablet (25 mg total) by mouth daily. 90 tablet 3   ULORIC 40 MG tablet Take 1 tablet (40 mg total) by mouth daily. 30 tablet 0   No current facility-administered medications for this visit.     Past Medical History:  Diagnosis Date   Adenocarcinoma of right lung, stage 3 ([place]) 08/23/2016   Anemia    Arthritis    "hx right hip"   Asthma    "when I was a child"   Atrial fibrillation ([place])    Amiodarone started 10/2011; Coumadin   Automatic implantable cardioverter-defibrillator in situ 10/03/2012   a. St. Jude ICD implantation 10/03/12.   Chronic anticoagulation    Chronic fatigue 5/73/2202   Chronic systolic heart failure ([place])    a. Echo 7/13: EF 25%;  b. echo 04/2012:  Mild LVH, EF 30-35%,  Gr 1 DD, mild AI, mild MR, mild LAE   COPD (chronic obstructive pulmonary disease) (HCC)    Dyslipidemia    Gout    History of blood transfusion 10/15/2013   "don't know where the blood's going; HgB down to 5"   Hyperlipidemia    Hypertension    Hypothyroidism    NICM (nonischemic cardiomyopathy) ([place])    [place] 4/14:  minimal CAD   Obesity    OSA on CPAP    Tobacco abuse     ROS:   All systems reviewed and negative except as noted in the HPI.   Past Surgical History:  Procedure Laterality Date   CARDIAC DEFIBRILLATOR PLACEMENT  2014   CARDIOVERSION  2011   CARDIOVERSION N/A 03/15/2020   Procedure: CARDIOVERSION;  Surgeon: Fay Records, MD;  Location: [place];  Service: Cardiovascular;  Laterality: N/A;   COLONOSCOPY WITH PROPOFOL Left 10/17/2013   Procedure: COLONOSCOPY WITH PROPOFOL;  Surgeon: Inda Castle, MD;  Location: [place];  Service: Endoscopy;  Laterality: Left;   ESOPHAGOGASTRODUODENOSCOPY N/A 10/17/2013   Procedure: ESOPHAGOGASTRODUODENOSCOPY (EGD);  Surgeon: Inda Castle, MD;  Location: [place];  Service: Endoscopy;  Laterality: N/A;   ESOPHAGOGASTRODUODENOSCOPY (EGD) WITH PROPOFOL N/A 06/14/2018   Procedure: ESOPHAGOGASTRODUODENOSCOPY (EGD) WITH PROPOFOL;  Surgeon: Doran Stabler, MD;  [facility];  Service: Gastroenterology;  Laterality: N/A;   GIVENS CAPSULE STUDY N/A 10/29/2013   Procedure: GIVENS CAPSULE STUDY;  Surgeon: Inda Castle, MD;  [facility];  Service: Endoscopy;  Laterality: N/A;   ICD GENERATOR CHANGEOUT N/A 02/13/2018   Procedure: ICD GENERATOR CHANGEOUT;  Surgeon: Constance Haw, MD;  Location: [place] CV [facility];  Service: Cardiovascular;  Laterality: N/A;   IMPLANTABLE CARDIOVERTER DEFIBRILLATOR IMPLANT Left 10/03/2012   Procedure: IMPLANTABLE CARDIOVERTER DEFIBRILLATOR IMPLANT;  Surgeon: Deboraha Sprang, MD;  [facility];  Service: Cardiovascular;  Laterality: Left;   IR FLUORO GUIDE PORT INSERTION RIGHT  09/21/2016   IR US GUIDE VASC ACCESS RIGHT  09/21/2016   JOINT REPLACEMENT     REVERSE SHOULDER ARTHROPLASTY Right 09/22/2020   Procedure: SHOULDER HEMI ARTHROPLASTY;  Surgeon: Hiram Gash, MD;  [facility];  Service: Orthopedics;  Laterality: Right;   SAVORY DILATION N/A 06/14/2018   Procedure: SAVORY DILATION;  Surgeon: Doran Stabler, MD;  [facility];  Service: Gastroenterology;  Laterality: N/A;   TONSILLECTOMY  1950's   TOTAL HIP ARTHROPLASTY Right 11/25/1997   VIDEO BRONCHOSCOPY WITH ENDOBRONCHIAL ULTRASOUND N/A 08/09/2016   Procedure: VIDEO BRONCHOSCOPY WITH ENDOBRONCHIAL ULTRASOUND;  Surgeon: Javier Glazier, MD;  Location: [facility];  Service: Thoracic;  Laterality: N/A;     Family History  Problem Relation Age of Onset   Hypertension Mother    Heart Problems Mother    Other  Father        deceased- unknow reason   Lung cancer Brother      Social History   Socioeconomic History   Marital status: Widowed    Spouse name: Not on file   Number of children: 1   Years of education: Not on file   Highest education level: Not on file  Occupational History   Occupation: retired    Fish farm manager: [employer]  Tobacco Use   Smoking status: Former    Packs/day: 0.25    Years: 45.00    Pack years: 11.25    Types: Cigarettes    Quit date: 07/11/2016    Years  since quitting: 5.1   Smokeless tobacco: Never  Vaping Use   Vaping Use: Never used  Substance and Sexual Activity   Alcohol use: Yes    Alcohol/week: 1.0 standard drink    Types: 1 Cans of beer per week    Comment: seldom   Drug use: Never   Sexual activity: Not Currently  Other Topics Concern   Not on file  Social History Narrative   Not on file   Social Determinants of Health   Financial Resource Strain: Low Risk    Difficulty of Paying Living Expenses: Not hard at all  Food Insecurity: No Food Insecurity   Worried About Charity fundraiser in the Last Year: Never true   Ran Out of Food in the Last Year: Never true  Transportation Needs: Not on file  Physical Activity: Not on file  Stress: No Stress Concern Present   Feeling of Stress : Not at all  Social Connections: Not on file  Intimate Partner Violence: Not At Risk   Fear of Current or Ex-Partner: No   Emotionally Abused: No   Physically Abused: No   Sexually Abused: No     BP 124/68   Pulse (!) 51   Ht 6\' 1"  (1.854 m)   Wt 285 lb (129.3 kg)   SpO2 97%   BMI 37.60 kg/m   Physical Exam:  Well appearing NAD HEENT: Unremarkable Neck:  No JVD, no thyromegally Lymphatics:  No adenopathy Back:  No CVA tenderness Lungs:  Clear with no wheezes HEART:  Regular rate rhythm, no murmurs, no rubs, no clicks Abd:  soft, positive bowel sounds, no organomegally, no rebound, no guarding Ext:  2 plus pulses, no edema, no cyanosis,  no clubbing Skin:  No rashes no nodules Neuro:  CN II through XII intact, motor grossly intact  EKG - 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  See PaceArt for details.   Assess/Plan:  Recurrent VT - *I have discussed the treatment options with the patient including catheter ablation, more amio, or watchful waiting. He would like to pursue catheter ablation.  Chronic systolic heart failure - his symptoms are class 2. No change.  Jason Overlie Brigham Cobbins,MD

## 2021-08-19 NOTE — Progress Notes (Signed)
HPI [name] is referred by Dr,. SK to consider VT ablation. He is a pleasant 71 yo man with a h/o NICM, EF 35%, s/p ICD insertion who has had a couple of episodes of ICD shock, most recently a month ago. He did not have a warning that he was about to get shocked except he notes that he felt weak prior to the shock. He had been fishinng all day. The patient denies worsening chest pain or sob. No edema.  No Known Allergies   Current Outpatient Medications  Medication Sig Dispense Refill   albuterol (VENTOLIN HFA) 108 (90 Base) MCG/ACT inhaler INHALE 2 PUFFS INTO THE LUNGS EVERY 4 HOURS AS NEEDED FOR WHEEZING OR SHORTNESS OF BREATH. 8.5 g 5   amiodarone (PACERONE) 200 MG tablet Take 0.5 tablets (100 mg total) by mouth See admin instructions. Take daily Except Saturday and Sunday 45 tablet 3   amLODipine (NORVASC) 5 MG tablet Take 5 mg by mouth daily.     atorvastatin (LIPITOR) 40 MG tablet TAKE 1 TABLET BY MOUTH EVERY DAY 90 tablet 3   bisoprolol (ZEBETA) 5 MG tablet Take 1 tablet (5 mg total) by mouth daily. 30 tablet 11   CVS D3 2000 units CAPS Take 2,000 Units by mouth daily.   11   ENTRESTO 97-103 MG TAKE 1 TABLET BY MOUTH TWICE A DAY 180 tablet 2   ferrous gluconate (FERGON) 324 MG tablet TAKE 1 TABLET (324 MG TOTAL) BY MOUTH 3 (THREE) TIMES DAILY WITH MEALS 270 tablet 1   Fluticasone-Umeclidin-Vilant (TRELEGY ELLIPTA) 100-62.5-25 MCG/INH AEPB Inhale 1 puff into the lungs daily. 3 each 3   furosemide (LASIX) 40 MG tablet Take 1 tablet (40 mg) every other day, alternating with 1/2 tablet (20 mg) every other day. 90 tablet 3   hydrALAZINE (APRESOLINE) 100 MG tablet TAKE 1 TABLET BY MOUTH THREE TIMES A DAY 270 tablet 3   levothyroxine (SYNTHROID) 50 MCG tablet TAKE 1 TABLET BY MOUTH EVERY DAY BEFORE BREAKFAST 90 tablet 3   magnesium oxide (MAG-OX) 400 (241.3 Mg) MG tablet Take 1 tablet (400 mg total) by mouth daily. 30 tablet 0   Polyvinyl Alcohol-Povidone (REFRESH OP) Place 1 drop  into both eyes every morning.     Potassium Chloride ER 20 MEQ TBCR TAKE 1 TABLET BY MOUTH EVERY DAY 90 tablet 3   rivaroxaban (XARELTO) 20 MG TABS tablet TAKE 1 TABLET BY MOUTH EVERY DAY 90 tablet 1   sildenafil (VIAGRA) 100 MG tablet Take 1 tablet (100 mg total) by mouth daily as needed for erectile dysfunction. 30 tablet 1   spironolactone (ALDACTONE) 25 MG tablet Take 1 tablet (25 mg total) by mouth daily. 90 tablet 3   ULORIC 40 MG tablet Take 1 tablet (40 mg total) by mouth daily. 30 tablet 0   No current facility-administered medications for this visit.     Past Medical History:  Diagnosis Date   Adenocarcinoma of right lung, stage 3 ([place]) 08/23/2016   Anemia    Arthritis    "hx right hip"   Asthma    "when I was a child"   Atrial fibrillation ([place])    Amiodarone started 10/2011; Coumadin   Automatic implantable cardioverter-defibrillator in situ 10/03/2012   a. St. Jude ICD implantation 10/03/12.   Chronic anticoagulation    Chronic fatigue 9/62/8366   Chronic systolic heart failure ([place])    a. Echo 7/13: EF 25%;  b. echo 04/2012:  Mild LVH, EF 30-35%,  Gr 1 DD, mild AI, mild MR, mild LAE   COPD (chronic obstructive pulmonary disease) (HCC)    Dyslipidemia    Gout    History of blood transfusion 10/15/2013   "don't know where the blood's going; HgB down to 5"   Hyperlipidemia    Hypertension    Hypothyroidism    NICM (nonischemic cardiomyopathy) ([place])    [place] 4/14:  minimal CAD   Obesity    OSA on CPAP    Tobacco abuse     ROS:   All systems reviewed and negative except as noted in the HPI.   Past Surgical History:  Procedure Laterality Date   CARDIAC DEFIBRILLATOR PLACEMENT  2014   CARDIOVERSION  2011   CARDIOVERSION N/A 03/15/2020   Procedure: CARDIOVERSION;  Surgeon: Fay Records, MD;  Location: [place];  Service: Cardiovascular;  Laterality: N/A;   COLONOSCOPY WITH PROPOFOL Left 10/17/2013   Procedure: COLONOSCOPY WITH PROPOFOL;  Surgeon: Inda Castle, MD;  Location: [place];  Service: Endoscopy;  Laterality: Left;   ESOPHAGOGASTRODUODENOSCOPY N/A 10/17/2013   Procedure: ESOPHAGOGASTRODUODENOSCOPY (EGD);  Surgeon: Inda Castle, MD;  Location: [place];  Service: Endoscopy;  Laterality: N/A;   ESOPHAGOGASTRODUODENOSCOPY (EGD) WITH PROPOFOL N/A 06/14/2018   Procedure: ESOPHAGOGASTRODUODENOSCOPY (EGD) WITH PROPOFOL;  Surgeon: Doran Stabler, MD;  [facility];  Service: Gastroenterology;  Laterality: N/A;   GIVENS CAPSULE STUDY N/A 10/29/2013   Procedure: GIVENS CAPSULE STUDY;  Surgeon: Inda Castle, MD;  [facility];  Service: Endoscopy;  Laterality: N/A;   ICD GENERATOR CHANGEOUT N/A 02/13/2018   Procedure: ICD GENERATOR CHANGEOUT;  Surgeon: Constance Haw, MD;  Location: [place] CV [facility];  Service: Cardiovascular;  Laterality: N/A;   IMPLANTABLE CARDIOVERTER DEFIBRILLATOR IMPLANT Left 10/03/2012   Procedure: IMPLANTABLE CARDIOVERTER DEFIBRILLATOR IMPLANT;  Surgeon: Deboraha Sprang, MD;  Location: [facility] C[facility] LAB;  Service: Cardiovascular;  Laterality: Left;   IR FLUORO GUIDE PORT INSERTION RIGHT  09/21/2016   IR US GUIDE VASC ACCESS RIGHT  09/21/2016   JOINT REPLACEMENT     REVERSE SHOULDER ARTHROPLASTY Right 09/22/2020   Procedure: SHOULDER HEMI ARTHROPLASTY;  Surgeon: Hiram Gash, MD;  [facility];  Service: Orthopedics;  Laterality: Right;   SAVORY DILATION N/A 06/14/2018   Procedure: SAVORY DILATION;  Surgeon: Doran Stabler, MD;  [facility];  Service: Gastroenterology;  Laterality: N/A;   TONSILLECTOMY  1950's   TOTAL HIP ARTHROPLASTY Right 11/25/1997   VIDEO BRONCHOSCOPY WITH ENDOBRONCHIAL ULTRASOUND N/A 08/09/2016   Procedure: VIDEO BRONCHOSCOPY WITH ENDOBRONCHIAL ULTRASOUND;  Surgeon: Javier Glazier, MD;  Location: [facility] OR;  Service: Thoracic;  Laterality: N/A;     Family History  Problem Relation Age of Onset   Hypertension Mother    Heart Problems Mother    Other  Father        deceased- unknow reason   Lung cancer Brother      Social History   Socioeconomic History   Marital status: Widowed    Spouse name: Not on file   Number of children: 1   Years of education: Not on file   Highest education level: Not on file  Occupational History   Occupation: retired    Fish farm manager: [employer]  Tobacco Use   Smoking status: Former    Packs/day: 0.25    Years: 45.00    Pack years: 11.25    Types: Cigarettes    Quit date: 07/11/2016    Years  since quitting: 5.1   Smokeless tobacco: Never  Vaping Use   Vaping Use: Never used  Substance and Sexual Activity   Alcohol use: Yes    Alcohol/week: 1.0 standard drink    Types: 1 Cans of beer per week    Comment: seldom   Drug use: Never   Sexual activity: Not Currently  Other Topics Concern   Not on file  Social History Narrative   Not on file   Social Determinants of Health   Financial Resource Strain: Low Risk    Difficulty of Paying Living Expenses: Not hard at all  Food Insecurity: No Food Insecurity   Worried About Charity fundraiser in the Last Year: Never true   Ran Out of Food in the Last Year: Never true  Transportation Needs: Not on file  Physical Activity: Not on file  Stress: No Stress Concern Present   Feeling of Stress : Not at all  Social Connections: Not on file  Intimate Partner Violence: Not At Risk   Fear of Current or Ex-Partner: No   Emotionally Abused: No   Physically Abused: No   Sexually Abused: No     BP 124/68   Pulse (!) 51   Ht 6\' 1"  (1.854 m)   Wt 285 lb (129.3 kg)   SpO2 97%   BMI 37.60 kg/m   Physical Exam:  Well appearing NAD HEENT: Unremarkable Neck:  No JVD, no thyromegally Lymphatics:  No adenopathy Back:  No CVA tenderness Lungs:  Clear with no wheezes HEART:  Regular rate rhythm, no murmurs, no rubs, no clicks Abd:  soft, positive bowel sounds, no organomegally, no rebound, no guarding Ext:  2 plus pulses, no edema, no cyanosis,  no clubbing Skin:  No rashes no nodules Neuro:  CN II through XII intact, motor grossly intact  EKG - 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  See PaceArt for details.   Assess/Plan:  Recurrent VT - *I have discussed the treatment options with the patient including catheter ablation, more amio, or watchful waiting. He would like to pursue catheter ablation.  Chronic systolic heart failure - his symptoms are class 2. No change.  Jason Russell Deniyah Dillavou,MD

## 2021-08-19 NOTE — Patient Instructions (Addendum)
Medication Instructions:  Your physician recommends that you continue on your current medications as directed. Please refer to the Current Medication list given to you today.  Labwork: You will get lab work today:  CBC and BMP  Testing/Procedures: None ordered.  Follow-Up:  SEE INSTRUCTION LETTER  Any Other Special Instructions Will Be Listed Below (If Applicable).  If you need a refill on your cardiac medications before your next appointment, please call your pharmacy.   Important Information About Sugar      Cardiac Ablation Cardiac ablation is a procedure to destroy, or ablate, a small amount of heart tissue in very specific places. The heart has many electrical connections. Sometimes these connections are abnormal and can cause the heart to beat very fast or irregularly. Ablating some of the areas that cause problems can improve the heart's rhythm or return it to normal. Ablation may be done for people who: Have Wolff-Parkinson-White syndrome. Have fast heart rhythms (tachycardia). Have taken medicines for an abnormal heart rhythm (arrhythmia) that were not effective or caused side effects. Have a high-risk heartbeat that may be life-threatening. During the procedure, a small incision is made in the neck or the groin, and a long, thin tube (catheter) is inserted into the incision and moved to the heart. Small devices (electrodes) on the tip of the catheter will send out electrical currents. A type of X-ray (fluoroscopy) will be used to help guide the catheter and to provide images of the heart. Tell a health care provider about: Any allergies you have. All medicines you are taking, including vitamins, herbs, eye drops, creams, and over-the-counter medicines. Any problems you or family members have had with anesthetic medicines. Any blood disorders you have. Any surgeries you have had. Any medical conditions you have, such as kidney failure. Whether you are pregnant or may be  pregnant. What are the risks? Generally, this is a safe procedure. However, problems may occur, including: Infection. Bruising and bleeding at the catheter insertion site. Bleeding into the chest, especially into the sac that surrounds the heart. This is a serious complication. Stroke or blood clots. Damage to nearby structures or organs. Allergic reaction to medicines or dyes. Need for a permanent pacemaker if the normal electrical system is damaged. A pacemaker is a small computer that sends electrical signals to the heart and helps your heart beat normally. The procedure not being fully effective. This may not be recognized until months later. Repeat ablation procedures are sometimes done. What happens before the procedure? Medicines Ask your health care provider about: Changing or stopping your regular medicines. This is especially important if you are taking diabetes medicines or blood thinners. Taking medicines such as aspirin and ibuprofen. These medicines can thin your blood. Do not take these medicines unless your health care provider tells you to take them. Taking over-the-counter medicines, vitamins, herbs, and supplements. General instructions Follow instructions from your health care provider about eating or drinking restrictions. Plan to have someone take you home from the hospital or clinic. If you will be going home right after the procedure, plan to have someone with you for 24 hours. Ask your health care provider what steps will be taken to prevent infection. What happens during the procedure?  An IV will be inserted into one of your veins. You will be given a medicine to help you relax (sedative). The skin on your neck or groin will be numbed. An incision will be made in your neck or your groin. A needle will be  inserted through the incision and into a large vein in your neck or groin. A catheter will be inserted into the needle and moved to your heart. Dye may be  injected through the catheter to help your surgeon see the area of the heart that needs treatment. Electrical currents will be sent from the catheter to ablate heart tissue in desired areas. There are three types of energy that may be used to do this: Heat (radiofrequency energy). Laser energy. Extreme cold (cryoablation). When the tissue has been ablated, the catheter will be removed. Pressure will be held on the insertion area to prevent a lot of bleeding. A bandage (dressing) will be placed over the insertion area. The exact procedure may vary among health care providers and hospitals. What happens after the procedure? Your blood pressure, heart rate, breathing rate, and blood oxygen level will be monitored until you leave the hospital or clinic. Your insertion area will be monitored for bleeding. You will need to lie still for a few hours to ensure that you do not bleed from the insertion area. Do not drive for 24 hours or as long as told by your health care provider. Summary Cardiac ablation is a procedure to destroy, or ablate, a small amount of heart tissue using an electrical current. This procedure can improve the heart rhythm or return it to normal. Tell your health care provider about any medical conditions you may have and all medicines you are taking to treat them. This is a safe procedure, but problems may occur. Problems may include infection, bruising, damage to nearby organs or structures, or allergic reactions to medicines. Follow your health care provider's instructions about eating and drinking before the procedure. You may also be told to change or stop some of your medicines. After the procedure, do not drive for 24 hours or as long as told by your health care provider. This information is not intended to replace advice given to you by your health care provider. Make sure you discuss any questions you have with your health care provider. Document Revised: 01/27/2019 Document  Reviewed: 01/27/2019 Elsevier Patient Education  [place].

## 2021-08-26 ENCOUNTER — Other Ambulatory Visit: Payer: Self-pay | Admitting: *Deleted

## 2021-08-26 NOTE — Patient Outreach (Signed)
[facility]) Care Management Telephonic RN Care Manager Note   09/06/2021 Name:  Jason Russell MRN:  [id number] DOB:  April 07, 1950  Summary: Follow up outreach to patient  Jason Russell reports he is doing well He confirms he is not lifting items and is getting assistance  He denies chest pain, further firing of device He confirms he is pending an EP study/V Tach ablation (Dr Cristopher Peru) "laser" for September 15 2021 Jason Russell had some mailed [facility] items returned He verified his correct address is correct in EPIC as [place] [address] and request the items to be re mailed  He looks forward to his daughter visit this holiday weekend   Recommendations/Changes made from today's visit: Follow up outreach for care coordination and disease management needs Reviewed returned mail items, reviewed address & sent message to [place] to re- mail items to patient as requested    Subjective: Jason Russell is an 71 y.o. year old male who is a primary patient of Lucianne Lei, MD. The care management team was consulted for assistance with care management and/or care coordination needs.    Telephonic RN Care Manager completed Telephone Visit today.   Objective:  Medications Reviewed Today     Reviewed by Guido Sander, CMA (Certified Medical Assistant) on 08/19/21 at 0854  Med List Status: <None>   Medication Order Taking? Sig Documenting Provider Last Dose Status Informant  albuterol (VENTOLIN HFA) 108 (90 Base) MCG/ACT inhaler [id number] Yes INHALE 2 PUFFS INTO THE LUNGS EVERY 4 HOURS AS NEEDED FOR WHEEZING OR SHORTNESS OF BREATH. Parrett, Fonnie Mu, NP Taking Active   amiodarone (PACERONE) 200 MG tablet [id number] Yes Take 0.5 tablets (100 mg total) by mouth See admin instructions. Take daily Except Saturday and Sunday [place], [place] Taking Active   amLODipine (NORVASC) 5 MG tablet [id number] Yes Take 5 mg by mouth daily. [provider] Taking  Active Self  atorvastatin (LIPITOR) 40 MG tablet [id number] Yes TAKE 1 TABLET BY MOUTH EVERY DAY Fay Records, MD Taking Active   bisoprolol (ZEBETA) 5 MG tablet [id number] Yes Take 1 tablet (5 mg total) by mouth daily. Deboraha Sprang, MD Taking Active   [employer] D3 2000 units CAPS [id number] Yes Take 2,000 Units by mouth daily.  [provider] Taking Active Self  ENTRESTO 97-103 MG [id number] Yes TAKE 1 TABLET BY MOUTH TWICE A DAY Fay Records, MD Taking Active   ferrous gluconate (FERGON) 324 MG tablet [id number] Yes TAKE 1 TABLET (324 MG TOTAL) BY MOUTH 3 (THREE) TIMES DAILY WITH MEALS Deboraha Sprang, MD Taking Active   Fluticasone-Umeclidin-Vilant (TRELEGY ELLIPTA) 100-62.5-25 MCG/INH AEPB [id number] Yes Inhale 1 puff into the lungs daily. Parrett, Fonnie Mu, NP Taking Active   furosemide (LASIX) 40 MG tablet [id number] Yes Take 1 tablet (40 mg) every other day, alternating with 1/2 tablet (20 mg) every other day. Fay Records, MD Taking Active   hydrALAZINE (APRESOLINE) 100 MG tablet [id number] Yes TAKE 1 TABLET BY MOUTH THREE TIMES A DAY Fay Records, MD Taking Active   levothyroxine (SYNTHROID) 50 MCG tablet [id number] Yes TAKE 1 TABLET BY MOUTH EVERY DAY BEFORE BREAKFAST Fay Records, MD Taking Active   magnesium oxide (MAG-OX) 400 (241.3 Mg) MG tablet [id number] Yes Take 1 tablet (400 mg total) by mouth daily. Geradine Girt, DO Taking Active Self  Polyvinyl Alcohol-Povidone (REFRESH OP) [id number] Yes Place 1 drop into both eyes every morning. [provider]  Taking Active Self  Potassium Chloride ER 20 MEQ TBCR [id number] Yes TAKE 1 TABLET BY MOUTH EVERY DAY Fay Records, MD Taking Active   rivaroxaban (XARELTO) 20 MG TABS tablet [id number] Yes TAKE 1 TABLET BY MOUTH EVERY DAY Fay Records, MD Taking Active   sildenafil (VIAGRA) 100 MG tablet [id number] Yes Take 1 tablet (100 mg total) by mouth daily as needed for erectile dysfunction. Fay Records, MD Taking Active    spironolactone (ALDACTONE) 25 MG tablet [id number] Yes Take 1 tablet (25 mg total) by mouth daily. Deboraha Sprang, MD Taking Active   ULORIC 40 MG tablet [id number] Yes Take 1 tablet (40 mg total) by mouth daily. Deboraha Sprang, MD Taking Active Self             SDOH:  (Social Determinants of Health) assessments and interventions performed:    Care Plan  Review of patient past medical history, allergies, medications, health status, including review of consultants reports, laboratory and other test data, was performed as part of comprehensive evaluation for care management services.   Care Plan : RN Care Manager Plan of Care  Updates made by Barbaraann Faster, RN since 09/06/2021 12:00 AM     Problem: Complex Care Coordination Needs and disease management in patient with atrial fibrillation   Priority: High  Onset Date: 07/12/2021     Long-Range Goal: Establish Plan of Care for Management Complex SDOH Barriers, disease management and Care Coordination Needs in patient with atrial fibrillation   Start Date: 07/12/2021  This Visit's Progress: On track  Recent Progress: On track  Priority: High  Note:   Current Barriers:  Knowledge Deficits related to plan of care for management of Atrial Fibrillation  Care Coordination needs related to Limited education about atrial fibrillation*  RN CM Clinical Goal(s):  Patient will verbalize understanding of plan for management of Atrial Fibrillation as evidenced by verbalized home management interventions  through collaboration with RN Care manager, provider, and care team.   Interventions: Outreaches for care coordination, disease management, resources, home care/education needs Inter-disciplinary care team collaboration (see longitudinal plan of care) Evaluation of current treatment plan related to  self management and patient's adherence to plan as established by provider 08/26/21 Reviewed returned mail items, reviewed address & sent  message to [place] to re- mail items to patient as requested    AFIB Interventions: (Status:  Goal on track:  Yes.) Long Term Goal 08/26/21 doing well Jason Russell reports he is doing well He confirms he is not lifting items and is getting assistance  He denies chest pain, further firing of device He confirms he is pending an EP study/V Tach ablation (Dr Cristopher Peru) "laser" for September 15 2021 He has met with his cardiologist and will have a cardiac procedure on 08/19/21    Reviewed importance of adherence to anticoagulant exactly as prescribed Counseled on importance of regular laboratory monitoring as prescribed Afib action plan reviewed Screening for signs and symptoms of depression related to chronic disease state  Assessed social determinant of health barriers Review of recent ED visit for atrial fibrillation/AICD firing Encouraged hospital follow up with pcp and specialist.  Discussed valsalva, defibrillator Discussed use of a pulse oximeter Sent EMMI education for vagal maneuvers and their responses, implantable cardioverter-defibrillators  Patient Goals/Self-Care Activities: Take all medications as prescribed Attend all scheduled provider appointments Attend church or other social activities Perform all self care activities independently  Perform IADL's (shopping, preparing meals, housekeeping, managing finances)  independently Call provider office for new concerns or questions   Follow Up Plan:  The patient has been provided with contact information for the care management team and has been advised to call with any health related questions or concerns.  The care management team will reach out to the patient again over the next 30+ business days.       Plan: The patient has been provided with contact information for the care management team and has been advised to call with any health related questions or concerns.  The care management team will reach out to the patient again over  the next 30+ business days.  Mirabel Russell L. Lavina Hamman, RN, BSN, [place] Coordinator Office number [phone] [facility] number [phone] Fax number [phone]

## 2021-08-31 DIAGNOSIS — I4891 Unspecified atrial fibrillation: Secondary | ICD-10-CM | POA: Diagnosis not present

## 2021-08-31 DIAGNOSIS — I10 Essential (primary) hypertension: Secondary | ICD-10-CM | POA: Diagnosis not present

## 2021-09-12 ENCOUNTER — Ambulatory Visit (INDEPENDENT_AMBULATORY_CARE_PROVIDER_SITE_OTHER): Payer: Medicare Other

## 2021-09-12 DIAGNOSIS — I5022 Chronic systolic (congestive) heart failure: Secondary | ICD-10-CM

## 2021-09-12 DIAGNOSIS — E039 Hypothyroidism, unspecified: Secondary | ICD-10-CM | POA: Diagnosis not present

## 2021-09-12 DIAGNOSIS — Z0000 Encounter for general adult medical examination without abnormal findings: Secondary | ICD-10-CM | POA: Diagnosis not present

## 2021-09-12 DIAGNOSIS — Z95810 Presence of automatic (implantable) cardiac defibrillator: Secondary | ICD-10-CM | POA: Diagnosis not present

## 2021-09-12 DIAGNOSIS — I130 Hypertensive heart and chronic kidney disease with heart failure and stage 1 through stage 4 chronic kidney disease, or unspecified chronic kidney disease: Secondary | ICD-10-CM | POA: Diagnosis not present

## 2021-09-14 ENCOUNTER — Telehealth: Payer: Self-pay | Admitting: Adult Health

## 2021-09-14 NOTE — Pre-Procedure Instructions (Signed)
Attempted to call patient regarding procedure instructions.  Left voicemail on the following items: Arrival time 0530 Nothing to eat or drink after midnight No meds AM of procedure Responsible person to drive you home and stay with you for 24 hrs  Have you missed any doses of anti-coagulant Xarelto- last dose tonight

## 2021-09-15 ENCOUNTER — Ambulatory Visit (HOSPITAL_COMMUNITY)
Admission: RE | Admit: 2021-09-15 | Discharge: 2021-09-15 | Disposition: A | Discharge status: Home / Self Care | Payer: Medicare Other | Source: Ambulatory Visit | Attending: Internal Medicine | Admitting: Internal Medicine

## 2021-09-15 ENCOUNTER — Encounter (HOSPITAL_COMMUNITY): Payer: Self-pay | Admitting: Internal Medicine

## 2021-09-15 ENCOUNTER — Ambulatory Visit (HOSPITAL_COMMUNITY)
Admission: RE | Disposition: A | Discharge status: Home / Self Care | Payer: Self-pay | Source: Ambulatory Visit | Attending: Internal Medicine

## 2021-09-15 ENCOUNTER — Other Ambulatory Visit: Payer: Self-pay

## 2021-09-15 ENCOUNTER — Ambulatory Visit (HOSPITAL_COMMUNITY): Payer: Medicare Other | Admitting: Certified Registered Nurse Anesthetist

## 2021-09-15 ENCOUNTER — Ambulatory Visit (HOSPITAL_BASED_OUTPATIENT_CLINIC_OR_DEPARTMENT_OTHER): Payer: Medicare Other | Admitting: Certified Registered Nurse Anesthetist

## 2021-09-15 DIAGNOSIS — I472 Ventricular tachycardia, unspecified: Secondary | ICD-10-CM | POA: Insufficient documentation

## 2021-09-15 DIAGNOSIS — I110 Hypertensive heart disease with heart failure: Secondary | ICD-10-CM | POA: Insufficient documentation

## 2021-09-15 DIAGNOSIS — I5022 Chronic systolic (congestive) heart failure: Secondary | ICD-10-CM | POA: Diagnosis not present

## 2021-09-15 DIAGNOSIS — I509 Heart failure, unspecified: Secondary | ICD-10-CM

## 2021-09-15 DIAGNOSIS — I428 Other cardiomyopathies: Secondary | ICD-10-CM | POA: Insufficient documentation

## 2021-09-15 DIAGNOSIS — Z95810 Presence of automatic (implantable) cardiac defibrillator: Secondary | ICD-10-CM | POA: Insufficient documentation

## 2021-09-15 DIAGNOSIS — J449 Chronic obstructive pulmonary disease, unspecified: Secondary | ICD-10-CM

## 2021-09-15 DIAGNOSIS — Z87891 Personal history of nicotine dependence: Secondary | ICD-10-CM | POA: Insufficient documentation

## 2021-09-15 HISTORY — PX: V TACH ABLATION: EP1227

## 2021-09-15 SURGERY — V TACH ABLATION
Anesthesia: General

## 2021-09-15 MED ORDER — SODIUM CHLORIDE 0.9 % IV SOLN: INTRAVENOUS | Status: DC

## 2021-09-15 MED ORDER — ROCURONIUM BROMIDE 10 MG/ML (PF) SYRINGE
PREFILLED_SYRINGE | INTRAVENOUS | Status: DC | PRN
  Administered 2021-09-15: 20 mg via INTRAVENOUS
  Administered 2021-09-15: 60 mg via INTRAVENOUS
  Administered 2021-09-15: 40 mg via INTRAVENOUS

## 2021-09-15 MED ORDER — HEPARIN (PORCINE) IN NACL 1000-0.9 UT/500ML-% IV SOLN
INTRAVENOUS | Status: AC
  Filled 2021-09-15: qty 500

## 2021-09-15 MED ORDER — SUGAMMADEX SODIUM 200 MG/2ML IV SOLN
INTRAVENOUS | Status: DC | PRN
  Administered 2021-09-15: 300 mg via INTRAVENOUS

## 2021-09-15 MED ORDER — AMIODARONE HCL 200 MG PO TABS: 200.0000 mg | ORAL_TABLET | Freq: Every day | ORAL | 3 refills | Status: DC

## 2021-09-15 MED ORDER — PHENYLEPHRINE HCL-NACL 20-0.9 MG/250ML-% IV SOLN
INTRAVENOUS | Status: DC | PRN
  Administered 2021-09-15: 25 ug/min via INTRAVENOUS

## 2021-09-15 MED ORDER — MIDAZOLAM HCL 5 MG/5ML IJ SOLN
INTRAMUSCULAR | Status: DC | PRN
  Administered 2021-09-15: 1 mg via INTRAVENOUS

## 2021-09-15 MED ORDER — ONDANSETRON HCL 4 MG/2ML IJ SOLN: 4.0000 mg | Freq: Four times a day (QID) | INTRAMUSCULAR | Status: DC | PRN

## 2021-09-15 MED ORDER — DEXAMETHASONE SODIUM PHOSPHATE 10 MG/ML IJ SOLN
INTRAMUSCULAR | Status: DC | PRN
  Administered 2021-09-15: 10 mg via INTRAVENOUS

## 2021-09-15 MED ORDER — PROPOFOL 10 MG/ML IV BOLUS
INTRAVENOUS | Status: DC | PRN
  Administered 2021-09-15: 50 mg via INTRAVENOUS
  Administered 2021-09-15: 100 mg via INTRAVENOUS

## 2021-09-15 MED ORDER — HEPARIN SODIUM (PORCINE) 1000 UNIT/ML IJ SOLN
INTRAMUSCULAR | Status: AC
Start: 2021-09-15 — End: ?
  Filled 2021-09-15: qty 10

## 2021-09-15 MED ORDER — LIDOCAINE 2% (20 MG/ML) 5 ML SYRINGE
INTRAMUSCULAR | Status: DC | PRN
  Administered 2021-09-15: 40 mg via INTRAVENOUS

## 2021-09-15 MED ORDER — HEPARIN (PORCINE) IN NACL 1000-0.9 UT/500ML-% IV SOLN
INTRAVENOUS | Status: DC | PRN
  Administered 2021-09-15: 500 mL

## 2021-09-15 MED ORDER — SUCCINYLCHOLINE CHLORIDE 200 MG/10ML IV SOSY
PREFILLED_SYRINGE | INTRAVENOUS | Status: DC | PRN
  Administered 2021-09-15: 160 mg via INTRAVENOUS

## 2021-09-15 MED ORDER — FENTANYL CITRATE (PF) 100 MCG/2ML IJ SOLN
INTRAMUSCULAR | Status: DC | PRN
  Administered 2021-09-15 (×2): 50 ug via INTRAVENOUS

## 2021-09-15 MED ORDER — ONDANSETRON HCL 4 MG/2ML IJ SOLN
INTRAMUSCULAR | Status: DC | PRN
  Administered 2021-09-15: 4 mg via INTRAVENOUS

## 2021-09-15 SURGICAL SUPPLY — 12 items
BAG SNAP BAND KOVER 36X36 (MISCELLANEOUS) ×1 IMPLANT
CATH JOSEPH QUAD ALLRED 6F REP (CATHETERS) ×1 IMPLANT
CATH WEB BI DIR CSDF CRV REPRO (CATHETERS) ×1 IMPLANT
COVER DOME SNAP 22 D (MISCELLANEOUS) ×1 IMPLANT
MAT PREVALON FULL STRYKER (MISCELLANEOUS) ×1 IMPLANT
PACK EP LATEX FREE (CUSTOM PROCEDURE TRAY) ×2
PACK EP LF (CUSTOM PROCEDURE TRAY) ×1 IMPLANT
PAD DEFIB RADIO PHYSIO CONN (PAD) ×2 IMPLANT
PATCH CARTO3 (PAD) ×1 IMPLANT
SHEATH PINNACLE 6F 10CM (SHEATH) ×1 IMPLANT
SHEATH PINNACLE 8F 10CM (SHEATH) ×2 IMPLANT
TUBING SMART ABLATE COOLFLOW (TUBING) ×1 IMPLANT

## 2021-09-15 NOTE — Anesthesia Procedure Notes (Addendum)
Procedure Name: Intubation Date/Time: 09/15/2021 7:58 AM  Performed by: Colin Benton, CRNAPre-anesthesia Checklist: Patient identified, Emergency Drugs available, Suction available, Patient being monitored and Timeout performed Patient Re-evaluated:Patient Re-evaluated prior to induction Oxygen Delivery Method: Circle system utilized Preoxygenation: Pre-oxygenation with 100% oxygen Induction Type: IV induction and Rapid sequence Laryngoscope Size: Glidescope and 3 Grade View: Grade I Tube type: Oral Tube size: 7.5 mm Number of attempts: 1 Airway Equipment and Method: Rigid stylet and Video-laryngoscopy Placement Confirmation: ETT inserted through vocal cords under direct vision, breath sounds checked- equal and bilateral and CO2 detector Secured at: 23 cm Tube secured with: Tape Dental Injury: Teeth and Oropharynx as per pre-operative assessment  Future Recommendations: Recommend- induction with short-acting agent, and alternative techniques readily available Comments: Somewhat difficult intubation in past- DL x 1 with glidescope 3.  EBBS and VSS

## 2021-09-15 NOTE — Discharge Instructions (Signed)

## 2021-09-15 NOTE — Anesthesia Preprocedure Evaluation (Addendum)
Anesthesia Evaluation  Patient identified by MRN, date of birth, ID band Patient awake    Reviewed: Allergy & Precautions, NPO status , Patient's Chart, lab work & pertinent test results  Airway Mallampati: IV  TM Distance: >3 FB Neck ROM: Full    Dental  (+) Dental Advisory Given, Teeth Intact   Pulmonary asthma (childhood) , sleep apnea and Continuous Positive Airway Pressure Ventilation , COPD,  COPD inhaler, former smoker,  Quit smoking 2018, 12 pack year history  Hx lung ca 2018 COPD well controlled, used trelegy this AM. hasnt needed rescue inhaler in a while    Pulmonary exam normal breath sounds clear to auscultation       Cardiovascular hypertension (135/76 in preop), Pt. on medications +CHF (LVEF 35%)  Normal cardiovascular exam+ dysrhythmias (Vtach w/ recurrent AICD shocks, afib on xarelto) Atrial Fibrillation + Cardiac Defibrillator (placed 2014 , gen change 2019)  Rhythm:Regular Rate:Normal  Echo 2020 1. The left ventricle has moderately reduced systolic function, with an  ejection fraction of 35-40%. The cavity size was normal. There is mildly  increased left ventricular wall thickness. Left ventricular diastolic  Doppler parameters are consistent with  impaired relaxation.  2. The right ventricle has mildly reduced systolic function. The cavity  was small. There is no increase in right ventricular wall thickness.  3. The mitral valve is normal in structure. Mild thickening of the mitral  valve leaflet.  4. The tricuspid valve is normal in structure.  5. The aortic valve is normal in structure. Aortic valve regurgitation is  mild to moderate by color flow Doppler.  6. The interatrial septum was not assessed.    Neuro/Psych PSYCHIATRIC DISORDERS Depression negative neurological ROS     GI/Hepatic negative GI ROS, Neg liver ROS,   Endo/Other  Hypothyroidism Obesity BMI 37  Renal/GU negative Renal ROS   negative genitourinary   Musculoskeletal  (+) Arthritis , Osteoarthritis,    Abdominal (+) + obese,   Peds  Hematology negative hematology ROS (+)   Anesthesia Other Findings   Reproductive/Obstetrics negative OB ROS                            Anesthesia Physical Anesthesia Plan  ASA: 3  Anesthesia Plan: General   Post-op Pain Management:    Induction: Intravenous and Rapid sequence  PONV Risk Score and Plan: Ondansetron, Dexamethasone, Treatment may vary due to age or medical condition and Midazolam  Airway Management Planned: Oral ETT and Video Laryngoscope Planned  Additional Equipment: None  Intra-op Plan:   Post-operative Plan: Extubation in OR  Informed Consent: I have reviewed the patients History and Physical, chart, labs and discussed the procedure including the risks, benefits and alternatives for the proposed anesthesia with the patient or authorized representative who has indicated his/her understanding and acceptance.     Dental advisory given  Plan Discussed with: CRNA  Anesthesia Plan Comments: (Airway from 2022 shoulder surgery: Ventilation: Two handed mask ventilation required and Oral airway inserted - appropriate to patient size Laryngoscope Size: Glidescope and 4 Grade View: Grade I Tube size: 7.5 mm Number of attempts: 3 Airway Equipment and Method: Rigid stylet, Video-laryngoscopy, Bite block and Oral airway Placement Confirmation: ETT inserted through vocal cords under direct vision, positive ETCO2 and breath sounds checked- equal and bilateral Secured at: 25 cm Tube secured with: Tape Dental Injury: Bloody posterior oropharynx  Difficulty Due To: Difficult Airway- due to limited oral opening and Difficult Airway-  due to dentition Future Recommendations: Recommend- induction with short-acting agent, and alternative techniques readily available Comments: First attempt using glidescope by SRNA. When styletted ETT  appeared on Glidescope screen after difficult insertion, it appeared that there may have been tissue surrounding/on the ETT, which along with blood pooling in the posterior oropharynx, suggested possible soft palate or tonsillar injury. Dr. Fransisco Beau took over at this point. ETT removed. Unable to see injury with glidescope given small mouth opening. After suctioning posterior oropharynx, intubated by Dr. Fransisco Beau using glidescope. Posterior oropharynx packed with rolled gauze per ENT recommendation. Will reevaluate at end of procedure.)        Anesthesia Quick Evaluation

## 2021-09-15 NOTE — Anesthesia Postprocedure Evaluation (Signed)
Anesthesia Post Note  Patient: Jason Russell  Procedure(s) Performed: Stephanie Coup ABLATION     Patient location during evaluation: PACU Anesthesia Type: General Level of consciousness: awake and alert, oriented and patient cooperative Pain management: pain level controlled Vital Signs Assessment: post-procedure vital signs reviewed and stable Respiratory status: spontaneous breathing, nonlabored ventilation and respiratory function stable Cardiovascular status: blood pressure returned to baseline and stable Postop Assessment: no apparent nausea or vomiting Anesthetic complications: no     Last Vitals:  Vitals:   09/15/21 0922 09/15/21 0925  BP: (!) 145/60 (!) 145/60  Pulse: (!) 55 (!) 51  Resp: 13 14  Temp:    SpO2: 97% 98%    Last Pain:  Vitals:   09/15/21 0915  TempSrc:   PainSc: 0-No pain                 Pervis Hocking

## 2021-09-15 NOTE — Transfer of Care (Signed)
Immediate Anesthesia Transfer of Care Note  Patient: Jason Russell  Procedure(s) Performed: Stephanie Coup ABLATION  Patient Location: Cath Lab  Anesthesia Type:General  Level of Consciousness: awake, alert , oriented and patient cooperative  Airway & Oxygen Therapy: Patient Spontanous Breathing and Patient connected to nasal cannula oxygen  Post-op Assessment: Report given to RN and Post -op Vital signs reviewed and stable  Post vital signs: Reviewed and stable  Last Vitals:  Vitals Value Taken Time  BP 158/65 09/15/21 0917  Temp    Pulse 57 09/15/21 0919  Resp 15 09/15/21 0919  SpO2 97 % 09/15/21 0919  Vitals shown include unvalidated device data.  Last Pain:  Vitals:   09/15/21 0548  TempSrc: Oral         Complications:  Encounter Notable Events  Notable Event Outcome Phase Comment  Difficult to intubate - expected  Intraprocedure Filed from anesthesia note documentation.

## 2021-09-15 NOTE — Interval H&P Note (Signed)
History and Physical Interval Note:  09/15/2021 7:30 AM  Jason Russell  has presented today for surgery, with the diagnosis of vtach.  The various methods of treatment have been discussed with the patient and family. After consideration of risks, benefits and other options for treatment, the patient has consented to  Procedure(s): V TACH ABLATION (N/A) as a surgical intervention.  The patient's history has been reviewed, patient examined, no change in status, stable for surgery.  I have reviewed the patient's chart and labs.  Questions were answered to the patient's satisfaction.     Cristopher Peru

## 2021-09-15 NOTE — Progress Notes (Signed)
Site area: Right groin a 6, 8 and 8 french venous sheaths were removed  Site Prior to Removal:  Level 0  Pressure Applied For 20 MINUTES    Bedrest Beginning at 1000am X 6 hours  Manual:   Yes.    Patient Status During Pull:  stable   Post Pull Groin Site:  Level 0  Post Pull Instructions Given:  Yes.    Post Pull Pulses Present:  Yes.    Dressing Applied:  Yes.    Comments:

## 2021-09-16 ENCOUNTER — Encounter (HOSPITAL_COMMUNITY): Payer: Self-pay | Admitting: Internal Medicine

## 2021-09-16 MED FILL — Heparin Sodium (Porcine) Inj 1000 Unit/ML: INTRAMUSCULAR | Qty: 10 | Status: AC

## 2021-09-16 NOTE — Progress Notes (Signed)
EPIC Encounter for ICM Monitoring  Patient Name: Jason Russell is a 71 y.o. male Date: 09/16/2021 Primary Care Physican: Lucianne Lei, MD Primary Cardiologist: Harrington Challenger Electrophysiologist: Caryl Comes 09/12/2021 Weight: 280 lbs      Spoke with patient and heart failure questions reviewed.  Pt asymptomatic for fluid accumulation.     CorVue Thoracic impedance suggesting normal fluid levels.     Prescribed:  Furosemide 40 mg take 0.5 tablet (20 mg total) by mouth every other day alternating with 1 tablet (40 mg total) every other day.  Potassium 20 mEq 1 tablet daily. Spironolactone 25 mg take 1 tablet daily   Labs: 08/19/2021 Creatinine 1.81, BUN 24, Potassium 3.8, Sodium 140, GFR 40 07/21/2021 Creatinine 1.81, BUN 27, Potassium 4.4, Sodium 141, GFR 40 07/06/2021 Creatinine 2.25, BUN 27, Potassium 3.9, Sodium 140 06/08/2021 Creatinine 2.09, BUN 30, Potassium 4.0, Sodium 140, GFR 34 04/25/2021 Creatinine 2.00, BUN 36, Potassium 4.0, Sodium 140, GFR 35 12/10/2020 Creatinine 1.66, BUN 18, Potassium 3.7, Sodium 142, GFR 44 10/19/2020 Creatinine 1.77, BUN 23, Potassium 3.7, Sodium 142 A complete set of results can be found in results review   Recommendations:   No changes and encouraged to call if experiencing any fluid symptoms.   Follow-up plan: ICM clinic phone appointment on 10/18/2021.   91 day device clinic remote transmission 10/17/2021.     EP/Cardiology Office Visits:   10/13/2021 with Dr Lovena Le.   Copy of ICM check sent to Dr. Caryl Comes.     3 month ICM trend: 09/12/2021.    12-14 Month ICM trend:     [name], RN 09/16/2021 3:39 PM

## 2021-09-16 NOTE — Telephone Encounter (Signed)
Verified with Nira Conn that the fax was received in [place] box. Tammy is out of office until next week. We will get orders signed next week and send back to [employer]. Nothing further needed

## 2021-09-20 DIAGNOSIS — M19011 Primary osteoarthritis, right shoulder: Secondary | ICD-10-CM | POA: Diagnosis not present

## 2021-09-22 ENCOUNTER — Other Ambulatory Visit (HOSPITAL_COMMUNITY): Payer: Self-pay | Admitting: Orthopaedic Surgery

## 2021-09-22 ENCOUNTER — Other Ambulatory Visit: Payer: Self-pay | Admitting: Orthopaedic Surgery

## 2021-09-22 DIAGNOSIS — S46002A Unspecified injury of muscle(s) and tendon(s) of the rotator cuff of left shoulder, initial encounter: Secondary | ICD-10-CM

## 2021-09-26 ENCOUNTER — Other Ambulatory Visit: Payer: Self-pay | Admitting: *Deleted

## 2021-09-26 ENCOUNTER — Encounter: Payer: Self-pay | Admitting: *Deleted

## 2021-10-05 ENCOUNTER — Ambulatory Visit (HOSPITAL_COMMUNITY)
Admission: RE | Admit: 2021-10-05 | Discharge: 2021-10-05 | Disposition: A | Discharge status: Home / Self Care | Payer: Medicare Other | Source: Ambulatory Visit | Attending: Orthopaedic Surgery | Admitting: Orthopaedic Surgery

## 2021-10-05 ENCOUNTER — Encounter (HOSPITAL_COMMUNITY): Payer: Self-pay

## 2021-10-05 DIAGNOSIS — S46002A Unspecified injury of muscle(s) and tendon(s) of the rotator cuff of left shoulder, initial encounter: Secondary | ICD-10-CM

## 2021-10-05 MED ORDER — LIDOCAINE HCL 1 % IJ SOLN
INTRAMUSCULAR | Status: AC
  Filled 2021-10-05: qty 20

## 2021-10-11 ENCOUNTER — Other Ambulatory Visit: Payer: Self-pay | Admitting: Internal Medicine

## 2021-10-12 ENCOUNTER — Ambulatory Visit (HOSPITAL_COMMUNITY)
Admission: RE | Admit: 2021-10-12 | Discharge: 2021-10-12 | Disposition: A | Discharge status: Home / Self Care | Payer: Medicare Other | Source: Ambulatory Visit | Attending: Orthopaedic Surgery | Admitting: Orthopaedic Surgery

## 2021-10-12 DIAGNOSIS — Y939 Activity, unspecified: Secondary | ICD-10-CM | POA: Insufficient documentation

## 2021-10-12 DIAGNOSIS — S46002A Unspecified injury of muscle(s) and tendon(s) of the rotator cuff of left shoulder, initial encounter: Secondary | ICD-10-CM | POA: Insufficient documentation

## 2021-10-12 DIAGNOSIS — X58XXXA Exposure to other specified factors, initial encounter: Secondary | ICD-10-CM | POA: Diagnosis not present

## 2021-10-12 MED ORDER — SODIUM CHLORIDE (PF) 0.9 % IJ SOLN
10.0000 mL | Freq: Once | INTRAMUSCULAR | Status: AC
  Administered 2021-10-12: 10 mL

## 2021-10-12 MED ORDER — LIDOCAINE HCL (PF) 1 % IJ SOLN
5.0000 mL | Freq: Once | INTRAMUSCULAR | Status: AC
  Administered 2021-10-12: 5 mL via INTRADERMAL

## 2021-10-12 MED ORDER — IOHEXOL 180 MG/ML  SOLN
10.0000 mL | Freq: Once | INTRAMUSCULAR | Status: AC | PRN
  Administered 2021-10-12: 10 mL via INTRA_ARTICULAR

## 2021-10-13 ENCOUNTER — Encounter: Payer: Self-pay | Admitting: Internal Medicine

## 2021-10-13 ENCOUNTER — Ambulatory Visit (INDEPENDENT_AMBULATORY_CARE_PROVIDER_SITE_OTHER): Payer: Medicare Other | Admitting: Internal Medicine

## 2021-10-13 VITALS — BP 136/74 | HR 50 | Ht 73.0 in | Wt 292.6 lb

## 2021-10-13 DIAGNOSIS — Z95810 Presence of automatic (implantable) cardiac defibrillator: Secondary | ICD-10-CM

## 2021-10-13 DIAGNOSIS — I5022 Chronic systolic (congestive) heart failure: Secondary | ICD-10-CM | POA: Diagnosis not present

## 2021-10-13 DIAGNOSIS — I472 Ventricular tachycardia, unspecified: Secondary | ICD-10-CM

## 2021-10-13 NOTE — Patient Instructions (Signed)
Medication Instructions:  Your physician recommends that you continue on your current medications as directed. Please refer to the Current Medication list given to you today.  *If you need a refill on your cardiac medications before your next appointment, please call your pharmacy*  Lab Work: None ordered.  If you have labs (blood work) drawn today and your tests are completely normal, you will receive your results only by: MyChart Message (if you have MyChart) OR A paper copy in the mail If you have any lab test that is abnormal or we need to change your treatment, we will call you to review the results.  Testing/Procedures: None ordered.  Follow-Up: At [facility], you and your health needs are our priority.  As part of our continuing mission to provide you with exceptional heart care, we have created designated Provider Care Teams.  These Care Teams include your primary Cardiologist (physician) and Advanced Practice Providers (APPs -  Physician Assistants and Nurse Practitioners) who all work together to provide you with the care you need, when you need it.  We recommend signing up for the patient portal called "MyChart".  Sign up information is provided on this After Visit Summary.  MyChart is used to connect with patients for Virtual Visits (Telemedicine).  Patients are able to view lab/test results, encounter notes, upcoming appointments, etc.  Non-urgent messages can be sent to your provider as well.   To learn more about what you can do with MyChart, go to [email or web address].com.    Your next appointment:   1 year(s)  The format for your next appointment:   In Person  Provider:   Gregg Taylor, MD{or one of the following Advanced Practice Providers on your designated Care Team:   Renee Ursuy, PA-C Buster "Andy" Tillery, PA-C   Important Information About Sugar       

## 2021-10-13 NOTE — Progress Notes (Signed)
HPI Mr. Broce returns today for followup after undergoing EP study a month ago. He has a nonischemic CM, EF 35% and VT and underwent EP study and was found to have no inducible MMVT. 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 He has gone back to fishing several times a week. He denies chest pain. He has class 2 dyspnea.   No Known Allergies   Current Outpatient Medications  Medication Sig Dispense Refill   albuterol (VENTOLIN HFA) 108 (90 Base) MCG/ACT inhaler INHALE 2 PUFFS INTO THE LUNGS EVERY 4 HOURS AS NEEDED FOR WHEEZING OR SHORTNESS OF BREATH. 8.5 g 5   amiodarone (PACERONE) 200 MG tablet Take 1 tablet (200 mg total) by mouth daily. Take daily Except Saturday and Sunday 45 tablet 3   atorvastatin (LIPITOR) 40 MG tablet TAKE 1 TABLET BY MOUTH EVERY DAY 90 tablet 3   bisoprolol (ZEBETA) 5 MG tablet Take 1 tablet (5 mg total) by mouth daily. 30 tablet 11   [employer] D3 2000 units CAPS Take 2,000 Units by mouth daily.   11   ENTRESTO 97-103 MG TAKE 1 TABLET BY MOUTH TWICE A DAY 180 tablet 2   ferrous gluconate (FERGON) 324 MG tablet TAKE 1 TABLET BY MOUTH 3 TIMES A DAY WITH MEALS 270 tablet 3   Fluticasone-Umeclidin-Vilant (TRELEGY ELLIPTA) 100-62.5-25 MCG/INH AEPB Inhale 1 puff into the lungs daily. 3 each 3   furosemide (LASIX) 40 MG tablet Take 1 tablet (40 mg) every other day, alternating with 1/2 tablet (20 mg) every other day. 90 tablet 3   levothyroxine (SYNTHROID) 50 MCG tablet TAKE 1 TABLET BY MOUTH EVERY DAY BEFORE BREAKFAST 90 tablet 3   magnesium oxide (MAG-OX) 400 (241.3 Mg) MG tablet Take 1 tablet (400 mg total) by mouth daily. 30 tablet 0   Polyvinyl Alcohol-Povidone (REFRESH OP) Place 1 drop into both eyes every morning.     Potassium Chloride ER 20 MEQ TBCR TAKE 1 TABLET BY MOUTH EVERY DAY 90 tablet 3   rivaroxaban (XARELTO) 20 MG TABS tablet TAKE 1 TABLET BY MOUTH EVERY DAY 90 tablet 1   sildenafil (VIAGRA) 100 MG tablet Take 1 tablet  (100 mg total) by mouth daily as needed for erectile dysfunction. 30 tablet 1   spironolactone (ALDACTONE) 25 MG tablet Take 1 tablet (25 mg total) by mouth daily. 90 tablet 3   ULORIC 40 MG tablet Take 1 tablet (40 mg total) by mouth daily. 30 tablet 0   hydrALAZINE (APRESOLINE) 100 MG tablet TAKE 1 TABLET BY MOUTH THREE TIMES A DAY (Patient not taking: Reported on 10/13/2021) 270 tablet 3   No current facility-administered medications for this visit.     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 St. Jude ICD implantation 10/03/12.   Chronic anticoagulation    Chronic fatigue 9/83/3825   Chronic systolic heart failure ([place])    a. Echo 7/13: EF 25%;  b. echo 04/2012:  Mild LVH, EF 30-35%, Gr 1 DD, mild AI, mild MR, mild LAE   COPD (chronic obstructive pulmonary disease) (H[facility])    Dyslipidemia    Gout    History of blood transfusion  10/15/2013   "don't know where the blood's going; HgB down to 5"   Hyperlipidemia    Hypertension    Hypothyroidism    NICM (nonischemic cardiomyopathy) ([place])    LHC 4/14:  minimal CAD   Obesity    OSA on CPAP    Tobacco abuse     ROS:   All systems reviewed and negative except as noted in the HPI.   Past Surgical History:  Procedure Laterality Date   CARDIAC DEFIBRILLATOR PLACEMENT  2014   CARDIOVERSION  2011   CARDIOVERSION N/A 03/15/2020   Procedure: CARDIOVERSION;  Surgeon: Fay Records, MD;  Location: [facility];  Service: Cardiovascular;  Laterality: N/A;   COLONOSCOPY WITH PROPOFOL Left 10/17/2013   Procedure: COLONOSCOPY WITH PROPOFOL;  Surgeon: Inda Castle, MD;  Location: [facility];  Service: Endoscopy;  Laterality: Left;   ESOPHAGOGASTRODUODENOSCOPY N/A  10/17/2013   Procedure: ESOPHAGOGASTRODUODENOSCOPY (EGD);  Surgeon: Inda Castle, MD;  Location: [facility];  Service: Endoscopy;  Laterality: N/A;   ESOPHAGOGASTRODUODENOSCOPY (EGD) WITH PROPOFOL N/A 06/14/2018   Procedure: ESOPHAGOGASTRODUODENOSCOPY (EGD) WITH PROPOFOL;  Surgeon: Doran Stabler, MD;  Location: [facility];  Service: Gastroenterology;  Laterality: N/A;   GIVENS CAPSULE STUDY N/A 10/29/2013   Procedure: GIVENS CAPSULE STUDY;  Surgeon: Inda Castle, MD;  Location: [facility];  Service: Endoscopy;  Laterality: N/A;   ICD GENERATOR CHANGEOUT N/A 02/13/2018   Procedure: ICD GENERATOR CHANGEOUT;  Surgeon: Constance Haw, MD;  Location: [facility];  Service: Cardiovascular;  Laterality: N/A;   IMPLANTABLE CARDIOVERTER DEFIBRILLATOR IMPLANT Left 10/03/2012   Procedure: IMPLANTABLE CARDIOVERTER DEFIBRILLATOR IMPLANT;  Surgeon: Deboraha Sprang, MD;  Location: [facility];  Service: Cardiovascular;  Laterality: Left;   IR FLUORO GUIDE PORT INSERTION RIGHT  09/21/2016   IR US GUIDE VASC ACCESS RIGHT  09/21/2016   JOINT REPLACEMENT     REVERSE SHOULDER ARTHROPLASTY Right 09/22/2020   Procedure: SHOULDER HEMI ARTHROPLASTY;  Surgeon: Hiram Gash, MD;  Location: [facility];  Service: Orthopedics;  Laterality: Right;   SAVORY DILATION N/A 06/14/2018   Procedure: SAVORY DILATION;  Surgeon: Doran Stabler, MD;  Location: [facility];  Service: Gastroenterology;  Laterality: N/A;   TONSILLECTOMY  1950's   TOTAL HIP ARTHROPLASTY Right 11/25/1997   V TACH ABLATION N/A 09/15/2021   Procedure: V TACH ABLATION;  Surgeon: Evans Lance, MD;  Location: [facility];  Service: Cardiovascular;  Laterality: N/A;   VIDEO BRONCHOSCOPY WITH ENDOBRONCHIAL ULTRASOUND N/A 08/09/2016   Procedure: VIDEO BRONCHOSCOPY WITH ENDOBRONCHIAL ULTRASOUND;  Surgeon: Javier Glazier, MD;  [facility] OR;  Service: Thoracic;  Laterality: N/A;     Family History  Problem Relation Age of Onset    Hypertension Mother    Heart Problems Mother    Other Father        deceased- unknow reason   Lung cancer Brother      Social History   Socioeconomic History   Marital status: Widowed    Spouse name: Not on file   Number of children: 1   Years of education: Not on file   Highest education level: Not on file  Occupational History   Occupation: retired    Fish farm manager: [employer]  Tobacco Use   Smoking status: Former    Packs/day: 0.25    Years: 45.00    Total pack years: 11.25    Types: Cigarettes    Quit date: 07/11/2016    Years  since quitting: 5.2   Smokeless tobacco: Never  Vaping Use   Vaping Use: Never used  Substance and Sexual Activity   Alcohol use: Yes    Alcohol/week: 1.0 standard drink of alcohol    Types: 1 Cans of beer per week    Comment: seldom   Drug use: Never   Sexual activity: Not Currently  Other Topics Concern   Not on file  Social History Narrative   Not on file   Social Determinants of Health   Financial Resource Strain: Low Risk  (09/26/2021)   Overall Financial Resource Strain (CARDIA)    Difficulty of Paying Living Expenses: Not hard at all  Food Insecurity: No Food Insecurity (07/22/2021)   Hunger Vital Sign    Worried About Running Out of Food in the Last Year: Never true    Ran Out of Food in the Last Year: Never true  Transportation Needs: No Transportation Needs (09/26/2021)   PRAPARE - Hydrologist (Medical): No    Lack of Transportation (Non-Medical): No  Physical Activity: Not on file  Stress: No Stress Concern Present (09/26/2021)   [place]    Feeling of Stress : Not at all  Social Connections: [place] (09/26/2021)   Social Connection and Isolation Panel [NHANES]    Frequency of Communication with Friends and Family: More than three times a week    Frequency of Social Gatherings with Friends and Family: Twice a  week    Attends Religious Services: 1 to 4 times per year    Active Member of [employer] or Organizations: Yes    Attends Archivist Meetings: 1 to 4 times per year    Marital Status: Married  Human resources officer Violence: Not At Risk (09/26/2021)   Humiliation, Afraid, Rape, and Kick questionnaire    Fear of Current or Ex-Partner: No    Emotionally Abused: No    Physically Abused: No    Sexually Abused: No     BP 136/74   Pulse (!) 50   Ht 6\' 1"  (1.854 m)   Wt 292 lb 9.6 oz (132.7 kg)   SpO2 95%   BMI 38.60 kg/m   Physical Exam:  Well appearing NAD HEENT: Unremarkable Neck:  No JVD, no thyromegally Lymphatics:  No adenopathy Back:  No CVA tenderness Lungs:  Clear with no wheezes HEART:  Regular rate rhythm, no murmurs, no rubs, no clicks Abd:  soft, positive bowel sounds, no organomegally, no rebound, no guarding Ext:  2 plus pulses, no edema, no cyanosis, no clubbing Skin:  No rashes no nodules Neuro:  CN II through XII intact, motor grossly intact  EKG - sinus brady with RBBB  DEVICE  Normal device function.  See PaceArt for details.   Assess/Plan:  VT - he has maintained NSR with no additional VT. He will continue 200 mg daily. Chronic systolic heart failure - he has class 2 symptoms. Continue current meds. Obesity -he is encouraged to lose weight.  ICD - his St. Jude single chamber ICD is working normally. We will recheck in several months.  Carleene Overlie Caid Radin,MD

## 2021-10-14 DIAGNOSIS — I129 Hypertensive chronic kidney disease with stage 1 through stage 4 chronic kidney disease, or unspecified chronic kidney disease: Secondary | ICD-10-CM | POA: Diagnosis not present

## 2021-10-14 DIAGNOSIS — N183 Chronic kidney disease, stage 3 unspecified: Secondary | ICD-10-CM | POA: Diagnosis not present

## 2021-10-17 ENCOUNTER — Ambulatory Visit (INDEPENDENT_AMBULATORY_CARE_PROVIDER_SITE_OTHER): Payer: Medicare Other

## 2021-10-17 DIAGNOSIS — I472 Ventricular tachycardia, unspecified: Secondary | ICD-10-CM | POA: Diagnosis not present

## 2021-10-18 ENCOUNTER — Ambulatory Visit (INDEPENDENT_AMBULATORY_CARE_PROVIDER_SITE_OTHER): Payer: Medicare Other

## 2021-10-18 DIAGNOSIS — N183 Chronic kidney disease, stage 3 unspecified: Secondary | ICD-10-CM | POA: Diagnosis not present

## 2021-10-18 DIAGNOSIS — Z95810 Presence of automatic (implantable) cardiac defibrillator: Secondary | ICD-10-CM

## 2021-10-18 DIAGNOSIS — I129 Hypertensive chronic kidney disease with stage 1 through stage 4 chronic kidney disease, or unspecified chronic kidney disease: Secondary | ICD-10-CM | POA: Diagnosis not present

## 2021-10-18 DIAGNOSIS — M898X9 Other specified disorders of bone, unspecified site: Secondary | ICD-10-CM | POA: Diagnosis not present

## 2021-10-18 DIAGNOSIS — I1310 Hypertensive heart and chronic kidney disease without heart failure, with stage 1 through stage 4 chronic kidney disease, or unspecified chronic kidney disease: Secondary | ICD-10-CM | POA: Diagnosis not present

## 2021-10-18 DIAGNOSIS — I5022 Chronic systolic (congestive) heart failure: Secondary | ICD-10-CM | POA: Diagnosis not present

## 2021-10-18 DIAGNOSIS — E559 Vitamin D deficiency, unspecified: Secondary | ICD-10-CM | POA: Diagnosis not present

## 2021-10-18 DIAGNOSIS — N1832 Chronic kidney disease, stage 3b: Secondary | ICD-10-CM | POA: Diagnosis not present

## 2021-10-18 DIAGNOSIS — D631 Anemia in chronic kidney disease: Secondary | ICD-10-CM | POA: Diagnosis not present

## 2021-10-18 LAB — CUP PACEART REMOTE DEVICE CHECK
Battery Remaining Longevity: 73 mo
Battery Remaining Percentage: 66 %
Battery Voltage: 2.98 V
Brady Statistic RV Percent Paced: 1 %
Date Time Interrogation Session: 20230717041037
HighPow Impedance: 43 Ohm
HighPow Impedance: 44 Ohm
Implantable Lead Implant Date: 20140703
Implantable Lead Location: 753860
Implantable Pulse Generator Implant Date: 20191113
Lead Channel Impedance Value: 400 Ohm
Lead Channel Pacing Threshold Amplitude: 0.5 V
Lead Channel Pacing Threshold Pulse Width: 0.5 ms
Lead Channel Sensing Intrinsic Amplitude: 3.1 mV
Lead Channel Setting Pacing Amplitude: 2.5 V
Lead Channel Setting Pacing Pulse Width: 0.5 ms
Lead Channel Setting Sensing Sensitivity: 0.5 mV
Pulse Gen Serial Number: 9820959

## 2021-10-18 NOTE — Progress Notes (Unsigned)
EPIC Encounter for ICM Monitoring  Patient Name: Demareon Coldwell is a 71 y.o. male Date: 10/18/2021 Primary Care Physican: Lucianne Lei, MD Primary Cardiologist: Harrington Challenger Electrophysiologist: Caryl Comes 09/12/2021 Weight: 280 lbs  10/19/2021 Weight: 280 lbs     Spoke with patient and heart failure questions reviewed.  Pt asymptomatic for fluid accumulation.   He has been drinking less than 64 oz fluid daily.   CorVue Thoracic impedance suggesting possible dryness over the last week.     Prescribed:  Furosemide 40 mg take 0.5 tablet (20 mg total) by mouth every other day alternating with 1 tablet (40 mg total) every other day.  Potassium 20 mEq 1 tablet daily. Spironolactone 25 mg take 1 tablet daily   Labs: 10/14/2021 Creatinine 2.27, BUN 36, Potassium 4.0, Sodium 141, GFR 30 08/19/2021 Creatinine 1.81, BUN 24, Potassium 3.8, Sodium 140, GFR 40 07/21/2021 Creatinine 1.81, BUN 27, Potassium 4.4, Sodium 141, GFR 40 07/06/2021 Creatinine 2.25, BUN 27, Potassium 3.9, Sodium 140 06/08/2021 Creatinine 2.09, BUN 30, Potassium 4.0, Sodium 140, GFR 34 04/25/2021 Creatinine 2.00, BUN 36, Potassium 4.0, Sodium 140, GFR 35 12/10/2020 Creatinine 1.66, BUN 18, Potassium 3.7, Sodium 142, GFR 44 10/19/2020 Creatinine 1.77, BUN 23, Potassium 3.7, Sodium 142 A complete set of results can be found in results review   Recommendations:  Advised to drink 64 oz fluid daily to stay hydrated and if working in the heat to increase the fluid amount.  No changes and encouraged to call if experiencing any fluid symptoms.   Follow-up plan: ICM clinic phone appointment on 11/21/2021.   91 day device clinic remote transmission 01/16/2022.     EP/Cardiology Office Visits:  Next EP appt 10/14/2022 with Dr Lovena Le (no recall).   Copy of ICM check sent to Dr. Caryl Comes.     3 month ICM trend: 10/18/2021.    12-14 Month ICM trend:     [name], RN 10/18/2021 7:40 AM

## 2021-10-24 ENCOUNTER — Inpatient Hospital Stay: Payer: Medicare Other | Attending: Internal Medicine

## 2021-10-24 ENCOUNTER — Ambulatory Visit (HOSPITAL_COMMUNITY)
Admission: RE | Admit: 2021-10-24 | Discharge: 2021-10-24 | Disposition: A | Discharge status: Home / Self Care | Payer: Medicare Other | Source: Ambulatory Visit | Attending: Internal Medicine | Admitting: Internal Medicine

## 2021-10-24 ENCOUNTER — Telehealth: Payer: Self-pay | Admitting: Internal Medicine

## 2021-10-24 DIAGNOSIS — I2510 Atherosclerotic heart disease of native coronary artery without angina pectoris: Secondary | ICD-10-CM | POA: Diagnosis not present

## 2021-10-24 DIAGNOSIS — C3490 Malignant neoplasm of unspecified part of unspecified bronchus or lung: Secondary | ICD-10-CM | POA: Insufficient documentation

## 2021-10-24 DIAGNOSIS — J449 Chronic obstructive pulmonary disease, unspecified: Secondary | ICD-10-CM | POA: Insufficient documentation

## 2021-10-24 DIAGNOSIS — I700 Atherosclerosis of aorta: Secondary | ICD-10-CM | POA: Insufficient documentation

## 2021-10-24 DIAGNOSIS — C3411 Malignant neoplasm of upper lobe, right bronchus or lung: Secondary | ICD-10-CM | POA: Diagnosis not present

## 2021-10-24 DIAGNOSIS — J984 Other disorders of lung: Secondary | ICD-10-CM | POA: Diagnosis not present

## 2021-10-24 DIAGNOSIS — I110 Hypertensive heart disease with heart failure: Secondary | ICD-10-CM | POA: Diagnosis not present

## 2021-10-24 DIAGNOSIS — I5022 Chronic systolic (congestive) heart failure: Secondary | ICD-10-CM | POA: Insufficient documentation

## 2021-10-24 LAB — CMP (CANCER CENTER ONLY)
ALT: 17 U/L (ref 0–44)
AST: 15 U/L (ref 15–41)
Albumin: 3.9 g/dL (ref 3.5–5.0)
Alkaline Phosphatase: 97 U/L (ref 38–126)
Anion gap: 3 — ABNORMAL LOW (ref 5–15)
BUN: 52 mg/dL — ABNORMAL HIGH (ref 8–23)
CO2: 26 mmol/L (ref 22–32)
Calcium: 9.5 mg/dL (ref 8.9–10.3)
Chloride: 111 mmol/L (ref 98–111)
Creatinine: 2.89 mg/dL — ABNORMAL HIGH (ref 0.61–1.24)
GFR, Estimated: 23 mL/min — ABNORMAL LOW (ref >60)
Glucose, Bld: 97 mg/dL (ref 70–99)
Potassium: 4.2 mmol/L (ref 3.5–5.1)
Sodium: 140 mmol/L (ref 135–145)
Total Bilirubin: 0.4 mg/dL (ref 0.3–1.2)
Total Protein: 6.7 g/dL (ref 6.5–8.1)

## 2021-10-24 LAB — CBC WITH DIFFERENTIAL (CANCER CENTER ONLY)
Abs Immature Granulocytes: 0.06 10*3/uL (ref 0.00–0.07)
Basophils Absolute: 0 10*3/uL (ref 0.0–0.1)
Basophils Relative: 1 %
Eosinophils Absolute: 0 10*3/uL (ref 0.0–0.5)
Eosinophils Relative: 0 %
HCT: 38.6 % — ABNORMAL LOW (ref 39.0–52.0)
Hemoglobin: 12.6 g/dL — ABNORMAL LOW (ref 13.0–17.0)
Immature Granulocytes: 1 %
Lymphocytes Relative: 9 %
Lymphs Abs: 0.5 10*3/uL — ABNORMAL LOW (ref 0.7–4.0)
MCH: 30.4 pg (ref 26.0–34.0)
MCHC: 32.6 g/dL (ref 30.0–36.0)
MCV: 93 fL (ref 80.0–100.0)
Monocytes Absolute: 0.7 10*3/uL (ref 0.1–1.0)
Monocytes Relative: 12 %
Neutro Abs: 4.5 10*3/uL (ref 1.7–7.7)
Neutrophils Relative %: 77 %
Platelet Count: 141 10*3/uL — ABNORMAL LOW (ref 150–400)
RBC: 4.15 MIL/uL — ABNORMAL LOW (ref 4.22–5.81)
RDW: 14.4 % (ref 11.5–15.5)
WBC Count: 5.8 10*3/uL (ref 4.0–10.5)
nRBC: 0 % (ref 0.0–0.2)

## 2021-10-24 NOTE — Telephone Encounter (Signed)
Rescheduled 07/26 appointment to 07/31 per patient's request, patient is notified of new upcoming appointment.

## 2021-10-25 DIAGNOSIS — M25512 Pain in left shoulder: Secondary | ICD-10-CM | POA: Diagnosis not present

## 2021-10-26 ENCOUNTER — Inpatient Hospital Stay: Payer: Medicare Other | Admitting: Internal Medicine

## 2021-10-31 ENCOUNTER — Other Ambulatory Visit: Payer: Self-pay

## 2021-10-31 ENCOUNTER — Inpatient Hospital Stay (HOSPITAL_BASED_OUTPATIENT_CLINIC_OR_DEPARTMENT_OTHER): Payer: Medicare Other | Admitting: Internal Medicine

## 2021-10-31 ENCOUNTER — Encounter: Payer: Self-pay | Admitting: Internal Medicine

## 2021-10-31 VITALS — BP 140/70 | HR 74 | Temp 98.1°F | Resp 15 | Wt 288.6 lb

## 2021-10-31 DIAGNOSIS — I110 Hypertensive heart disease with heart failure: Secondary | ICD-10-CM | POA: Diagnosis not present

## 2021-10-31 DIAGNOSIS — I2510 Atherosclerotic heart disease of native coronary artery without angina pectoris: Secondary | ICD-10-CM | POA: Diagnosis not present

## 2021-10-31 DIAGNOSIS — I5022 Chronic systolic (congestive) heart failure: Secondary | ICD-10-CM | POA: Diagnosis not present

## 2021-10-31 DIAGNOSIS — C3411 Malignant neoplasm of upper lobe, right bronchus or lung: Secondary | ICD-10-CM

## 2021-10-31 DIAGNOSIS — J449 Chronic obstructive pulmonary disease, unspecified: Secondary | ICD-10-CM | POA: Diagnosis not present

## 2021-10-31 DIAGNOSIS — I700 Atherosclerosis of aorta: Secondary | ICD-10-CM | POA: Diagnosis not present

## 2021-10-31 NOTE — Progress Notes (Signed)
[facility] [phone]   [phone]  OFFICE PROGRESS NOTE  Lucianne Lei, MD [address] [place] [address]  DIAGNOSIS: Stage IIIA (T1a, N2, M0) non-small cell lung cancer, adenocarcinoma presented with right upper lobe lung nodule in addition to mediastinal lymphadenopathy diagnosed in March 2018.  Biomarker Findings Tumor Mutational Burden - TMB-Intermediate (8 Muts/Mb) Microsatellite Status - MS-Stable Genomic Findings For a complete list of the genes assayed, please refer to the Appendix. ERBB2 amplification - equivocal? DNMT3A K415f*192 FUBP1 Q40* KEAP1 [id number] TP53 C275F 7 Disease relevant genes with no reportable alterations: EGFR, KRAS, ALK, BRAF, MET, RET, ROS1   PRIOR THERAPY:  1) Course of concurrent chemoradiation with weekly carboplatin for AUC of 2 and paclitaxel 45 MG/M2. Status post 6 cycles. Last cycle was given 10/16/2016. 2)  Consolidation treatment with immunotherapy with Imfinzi (Durvalumab) 10 MG/M2 every 2 weeks. First dose 12/21/2016.  Status post 26 cycles.  CURRENT THERAPY: Observation.  INTERVAL HISTORY: Jason Lipsky71.o. male returns to the clinic today for follow-up visit accompanied by his daughter.  The patient is feeling fine today with no concerning complaints.  He denied having any chest pain, shortness of breath, cough or hemoptysis.  He has no nausea, vomiting, diarrhea or constipation.  He has no headache or visual changes.  He denied having any significant weight loss or night sweats.  He is here today for evaluation with repeat CT scan of the chest for restaging of his disease.  MEDICAL HISTORY: Past Medical History:  Diagnosis Date   Adenocarcinoma of right lung, stage 3 (HCPerth5/23/2018   Anemia    Arthritis    "hx right hip"   Asthma    "when I was a child"   Atrial fibrillation ([place]   Amiodarone started 10/2011; Coumadin   Automatic implantable cardioverter-defibrillator  in situ 10/03/2012   a. St. Jude ICD implantation 10/03/12.   Chronic anticoagulation    Chronic fatigue 10/03/35/6283 Chronic systolic heart failure ([facility]   a. Echo 7/13: EF 25%;  b. echo 04/2012:  Mild LVH, EF 30-35%, Gr 1 DD, mild AI, mild MR, mild LAE   COPD (chronic obstructive pulmonary disease) (HCC)    Dyslipidemia    Gout    History of blood transfusion 10/15/2013   "don't know where the blood's going; HgB down to 5"   Hyperlipidemia    Hypertension    Hypothyroidism    NICM (nonischemic cardiomyopathy) (HCJackson[place]   LHWayne/14:  minimal CAD   Obesity    OSA on CPAP    Tobacco abuse     ALLERGIES:  has No Known Allergies.  MEDICATIONS:  Current Outpatient Medications  Medication Sig Dispense Refill   albuterol (VENTOLIN HFA) 108 (90 Base) MCG/ACT inhaler INHALE 2 PUFFS INTO THE LUNGS EVERY 4 HOURS AS NEEDED FOR WHEEZING OR SHORTNESS OF BREATH. 8.5 g 5   amiodarone (PACERONE) 200 MG tablet Take 1 tablet (200 mg total) by mouth daily. Take daily Except Saturday and Sunday 45 tablet 3   atorvastatin (LIPITOR) 40 MG tablet TAKE 1 TABLET BY MOUTH EVERY DAY 90 tablet 3   bisoprolol (ZEBETA) 5 MG tablet Take 1 tablet (5 mg total) by mouth daily. 30 tablet 11   [employer] D3 2000 units CAPS Take 2,000 Units by mouth daily.   11   ENTRESTO 97-103 MG TAKE 1 TABLET BY MOUTH TWICE A DAY 180 tablet 2   ferrous  gluconate (FERGON) 324 MG tablet TAKE 1 TABLET BY MOUTH 3 TIMES A DAY WITH MEALS 270 tablet 3   Fluticasone-Umeclidin-Vilant (TRELEGY ELLIPTA) 100-62.5-25 MCG/INH AEPB Inhale 1 puff into the lungs daily. 3 each 3   furosemide (LASIX) 40 MG tablet Take 1 tablet (40 mg) every other day, alternating with 1/2 tablet (20 mg) every other day. 90 tablet 3   levothyroxine (SYNTHROID) 50 MCG tablet TAKE 1 TABLET BY MOUTH EVERY DAY BEFORE BREAKFAST 90 tablet 3   magnesium oxide (MAG-OX) 400 (241.3 Mg) MG tablet Take 1 tablet (400 mg total) by mouth daily. 30 tablet 0   Polyvinyl Alcohol-Povidone (REFRESH  OP) Place 1 drop into both eyes every morning.     Potassium Chloride ER 20 MEQ TBCR TAKE 1 TABLET BY MOUTH EVERY DAY 90 tablet 3   rivaroxaban (XARELTO) 20 MG TABS tablet TAKE 1 TABLET BY MOUTH EVERY DAY 90 tablet 1   sildenafil (VIAGRA) 100 MG tablet TAKE 1 TABLET BY MOUTH EVERY DAY AS NEEDED FOR ERECTILE DYSFUNCTION 30 tablet 1   spironolactone (ALDACTONE) 25 MG tablet Take 1 tablet (25 mg total) by mouth daily. 90 tablet 3   ULORIC 40 MG tablet Take 1 tablet (40 mg total) by mouth daily. 30 tablet 0   No current facility-administered medications for this visit.    SURGICAL HISTORY:  Past Surgical History:  Procedure Laterality Date   CARDIAC DEFIBRILLATOR PLACEMENT  2014   CARDIOVERSION  2011   CARDIOVERSION N/A 03/15/2020   Procedure: CARDIOVERSION;  Surgeon: Fay Records, MD;  Location: [place];  Service: Cardiovascular;  Laterality: N/A;   COLONOSCOPY WITH PROPOFOL Left 10/17/2013   Procedure: COLONOSCOPY WITH PROPOFOL;  Surgeon: Inda Castle, MD;  Location: [place];  Service: Endoscopy;  Laterality: Left;   ESOPHAGOGASTRODUODENOSCOPY N/A 10/17/2013   Procedure: ESOPHAGOGASTRODUODENOSCOPY (EGD);  Surgeon: Inda Castle, MD;  Location: [place];  Service: Endoscopy;  Laterality: N/A;   ESOPHAGOGASTRODUODENOSCOPY (EGD) WITH PROPOFOL N/A 06/14/2018   Procedure: ESOPHAGOGASTRODUODENOSCOPY (EGD) WITH PROPOFOL;  Surgeon: Doran Stabler, MD;  Location: [facility];  Service: Gastroenterology;  Laterality: N/A;   GIVENS CAPSULE STUDY N/A 10/29/2013   Procedure: GIVENS CAPSULE STUDY;  Surgeon: Inda Castle, MD;  Location: [facility];  Service: Endoscopy;  Laterality: N/A;   ICD GENERATOR CHANGEOUT N/A 02/13/2018   Procedure: ICD GENERATOR CHANGEOUT;  Surgeon: Constance Haw, MD;  Location: [place] CV [facility];  Service: Cardiovascular;  Laterality: N/A;   IMPLANTABLE CARDIOVERTER DEFIBRILLATOR IMPLANT Left 10/03/2012   Procedure: IMPLANTABLE CARDIOVERTER  DEFIBRILLATOR IMPLANT;  Surgeon: Deboraha Sprang, MD;  Location[facility];  Service: Cardiovascular;  Laterality: Left;   IR FLUORO GUIDE PORT INSERTION RIGHT  09/21/2016   IR US GUIDE VASC ACCESS RIGHT  09/21/2016   JOINT REPLACEMENT     REVERSE SHOULDER ARTHROPLASTY Right 09/22/2020   Procedure: SHOULDER HEMI ARTHROPLASTY;  Surgeon: Hiram Gash, MD;  Location: [facility];  Service: Orthopedics;  Laterality: Right;   SAVORY DILATION N/A 06/14/2018   Procedure: SAVORY DILATION;  Surgeon: Doran Stabler, MD;  Location: [facility];  Service: Gastroenterology;  Laterality: N/A;   TONSILLECTOMY  1950's   TOTAL HIP ARTHROPLASTY Right 11/25/1997   V TACH ABLATION N/A 09/15/2021   Procedure: V TACH ABLATION;  Surgeon: Evans Lance, MD;  Location: [place] CV LAB;  Service: Cardiovascular;  Laterality: N/A;   VIDEO BRONCHOSCOPY WITH ENDOBRONCHIAL ULTRASOUND N/A 08/09/2016   Procedure: VIDEO BRONCHOSCOPY WITH ENDOBRONCHIAL ULTRASOUND;  Surgeon: Javier Glazier, MD;  Location: [facility];  Service: Thoracic;  Laterality: N/A;    REVIEW OF SYSTEMS:  A comprehensive review of systems was negative except for: Respiratory: positive for dyspnea on exertion   PHYSICAL EXAMINATION: General appearance: alert, cooperative, and no distress Head: Normocephalic, without obvious abnormality, atraumatic Neck: no adenopathy, no JVD, supple, symmetrical, trachea midline, and thyroid not enlarged, symmetric, no tenderness/mass/nodules Lymph nodes: Cervical, supraclavicular, and axillary nodes normal. Resp: clear to auscultation bilaterally Back: symmetric, no curvature. ROM normal. No CVA tenderness. Cardio: regular rate and rhythm, S1, S2 normal, no murmur, click, rub or gallop GI: soft, non-tender; bowel sounds normal; no masses,  no organomegaly Extremities: extremities normal, atraumatic, no cyanosis or edema  ECOG PERFORMANCE STATUS: 1 - Symptomatic but completely ambulatory  Blood pressure 140/70, pulse  74, temperature 98.1 F (36.7 C), temperature source Oral, resp. rate 15, weight 288 lb 9.6 oz (130.9 kg), SpO2 99 %.  LABORATORY DATA: Lab Results  Component Value Date   WBC 5.8 10/24/2021   HGB 12.6 (L) 10/24/2021   HCT 38.6 (L) 10/24/2021   MCV 93.0 10/24/2021   PLT 141 (L) 10/24/2021      Chemistry      Component Value Date/Time   NA 140 10/24/2021 1406   NA 140 08/19/2021 0934   NA 141 03/29/2017 1038   K 4.2 10/24/2021 1406   K 4.0 03/29/2017 1038   CL 111 10/24/2021 1406   CO2 26 10/24/2021 1406   CO2 27 03/29/2017 1038   BUN 52 (H) 10/24/2021 1406   BUN 24 08/19/2021 0934   BUN 28.6 (H) 03/29/2017 1038   CREATININE 2.89 (H) 10/24/2021 1406   CREATININE 1.6 (H) 03/29/2017 1038      Component Value Date/Time   CALCIUM 9.5 10/24/2021 1406   CALCIUM 9.3 03/29/2017 1038   ALKPHOS 97 10/24/2021 1406   ALKPHOS 105 03/29/2017 1038   AST 15 10/24/2021 1406   AST 18 03/29/2017 1038   ALT 17 10/24/2021 1406   ALT 13 03/29/2017 1038   BILITOT 0.4 10/24/2021 1406   BILITOT 0.62 03/29/2017 1038       RADIOGRAPHIC STUDIES: CT Chest Wo Contrast  Result Date: 10/25/2021 CLINICAL DATA:  Non-small cell lung cancer staging. Insert body on EXAM: CT CHEST WITHOUT CONTRAST TECHNIQUE: Multidetector CT imaging of the chest was performed following the standard protocol without IV contrast. RADIATION DOSE REDUCTION: This exam was performed according to the departmental dose-optimization program which includes automated exposure control, adjustment of the mA and/or kV according to patient size and/or use of iterative reconstruction technique. COMPARISON:  April 25, 2021 FINDINGS: Cardiovascular: RIGHT-sided Port-A-Cath terminates in the distal superior vena cava. LEFT-sided cardiac assist device in place, leads in the RIGHT heart. Power pack over the LEFT chest entering via subclavian approach similar to previous imaging. Calcified plaque throughout the thoracic aorta. Normal heart  size without pericardial effusion or nodularity. Normal caliber of central pulmonary vessels. Limited assessment of cardiovascular structures given lack of intravenous contrast. Mediastinum/Nodes: Thoracic inlet structures are normal. No axillary adenopathy. No internal mammary adenopathy. No mediastinal lymphadenopathy. Esophagus grossly normal. No gross hilar adenopathy. Lungs/Pleura: RIGHT paramediastinal fibrosis following radiotherapy. No new areas of consolidation or suspicious pulmonary nodule. Airways are patent. Upper Abdomen: No acute process in the upper abdomen to the extent evaluated. Signs of nephrolithiasis of the RIGHT kidney and renal cortical scarring with similar appearance. Largest calculus in the RIGHT kidney at 9 mm in the interpolar-lower pole RIGHT  kidney. Imaged portions the liver, biliary tree, pancreas, spleen, adrenal glands and gastrointestinal tract without acute process. Musculoskeletal: No acute bone finding. No destructive bone process. Spinal degenerative changes. IMPRESSION: 1. No new or progressive findings in the chest with evidence of post treatment change about the RIGHT hilum and RIGHT paramediastinal lung. 2. Signs of nephrolithiasis of the RIGHT kidney and renal cortical scarring with similar appearance. 3. Aortic atherosclerosis. Aortic Atherosclerosis (ICD10-I70.0). Electronically Signed   By: Zetta Bills M.D.   On: 10/25/2021 09:18   CUP PACEART REMOTE DEVICE CHECK  Result Date: 10/18/2021 Scheduled remote reviewed. Normal device function.  Next remote 91 days. LA  CT SHOULDER LEFT W CONTRAST  Result Date: 10/13/2021 CLINICAL DATA:  CT left shoulder arthrogram.  Left shoulder injury. EXAM: CT OF THE UPPER LEFT EXTREMITY WITH all cap that TECHNIQUE: Multidetector CT imaging of the upper left extremity was performed according to the standard protocol following intravenous contrast administration. RADIATION DOSE REDUCTION: This exam was performed according to the  departmental dose-optimization program which includes automated exposure control, adjustment of the mA and/or kV according to patient size and/or use of iterative reconstruction technique. CONTRAST:  Please see injection documentation. COMPARISON:  None Available. FINDINGS: Bones/Joint/Cartilage Mild glenohumeral joint space narrowing. Mild peripheral glenoid and inferior humeral head-neck junction degenerative osteophytosis. There are chronic subcortical cystic changes within the superolateral humeral head deep to the supraspinatus and infraspinatus tendon insertions. Moderate left acromioclavicular joint space narrowing and peripheral osteophytosis. No acute fracture is seen.  No dislocation. There are small sclerotic foci within the posterolateral seventh rib that are unchanged from 04/15/2020 chest CT likely bone islands. There are multilevel cervical spine degenerative changes including uncovertebral and facet joint hypertrophy contributing to moderate to severe left C4-5 and C5-6 left neuroforaminal stenosis. Ligaments Suboptimally assessed by CT. Muscles and Tendons There is attenuation of the anterior greater than mid AP dimensions of the supraspinatus tendon insertion suggesting partial-thickness articular greater than bursal sided tears (coronal series 9 images 90 through 99 and sagittal series 10 images 16 through 21). No definite infraspinatus tear is seen. There is mild extension of contrast into the deep aspect of the superior subscapularis tendon insertion (sagittal series 10, image 29), likely a mild articular sided partial-thickness tear. No definite teres minor tendon tear is seen. There is moderate supraspinatus muscle atrophy. Soft tissues Moderate atherosclerotic calcifications within the aortic arch. Left chest wall cardiac pacer. The heart is only minimally partially visualized but appears enlarged. No gross abnormality is seen within the partially visualized superior left lung. IMPRESSION: 1.  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 2. Partial-thickness anterior greater than mid AP dimension of the supraspinatus tendon tears. No full-thickness tear is seen. 3. Likely tiny articular sided partial-thickness tear of the superior subscapularis tendon insertion. Electronically Signed   By: Yvonne Kendall M.D.   On: 10/13/2021 14:54   DG FLUORO GUIDED NEEDLE PLC ASPIRATION/INJECTION LOC  Result Date: 10/12/2021 CLINICAL DATA:  Injury of left rotator cuff, initial encounter. EXAM: SHOULDER INJECTION FOR CT ARTHROGRAM. PROCEDURE: Prior to the procedure, informed consent was obtained from the patient by Candiss Norse, PA-C. This process included a discussion of procedural risks including but not limited to bleeding, infection, injury to regional nerves and blood vessels and extra-articular injection. A skin entry site was determined under fluoroscopy and marked. The operator donned sterile gloves and a mask. The skin entry site was prepped and draped in the usual sterile fashion. The skin and deeper subcutaneous tissues were anesthetized  with 1% Lidocaine without epinephrine. Under fluoroscopic guidance, a 22 gauge 3.5 inch needle was advanced into the joint at the superomedial humeral head using an anterior approach. Intra-articular injection of Lidocaine was performed, which flowed freely and subsequently the joint was distended with 13 mL mL of an arthrogram contrast solution (15 mL Omnipaque 180, 5 mL sterile saline). The needle was removed and a dressing was applied at the skin entry site. The patient was then transported to the CT department for CT imaging of the left shoulder. The patient tolerated the procedure well and no immediate post-procedure complication was apparent. The procedure was performed by Candiss Norse, PA-C, and was supervised and interpreted by Kellie Simmering, D.O. FLUOROSCOPY: Fluoroscopy time: 1 minute (4.50 mGy). IMPRESSION: 1. Technically successful  fluoroscopically-guided left shoulder joint injection for subsequent CT arthrogram. 2. No immediate post-procedure complication. Electronically Signed   By: Kellie Simmering D.O.   On: 10/12/2021 10:44     ASSESSMENT AND PLAN:  This is a very pleasant 71 years old African-American male with stage IIIA non-small cell lung cancer, adenocarcinoma.  The patient completed 6 weeks of concurrent chemoradiation with weekly carboplatin and paclitaxel and tolerated his treatment well except for odynophagia. The patient was then started on consolidation treatment with immunotherapy with Imfinzi (Durvalumab) status post 26 cycles.  The patient is currently on observation and he is feeling fine with no concerning complaints. He had repeat CT scan of the chest performed recently.  I personally and independently reviewed the scans and discussed the result with the patient and his daughter. His scan showed no concerning findings for disease progression. I recommended for him to continue on observation with repeat CT scan of the chest in 6 months. For the renal insufficiency, he is followed by nephrology and he was advised to avoid any NSAIDs and discussed with his nephrologist for any additional recommendation. Regarding the COPD and coronary artery disease, he will follow with his primary care physician. The patient was advised to call immediately if he has any other concerning symptoms in the interval. The patient voices understanding of current disease status and treatment options and is in agreement with the current care plan. All questions were answered. The patient knows to call the clinic with any problems, questions or concerns. We can certainly see the patient much sooner if necessary. Disclaimer: This note was dictated with voice recognition software. Similar sounding words can inadvertently be transcribed and may not be corrected upon review.

## 2021-11-02 DIAGNOSIS — M6281 Muscle weakness (generalized): Secondary | ICD-10-CM | POA: Diagnosis not present

## 2021-11-02 DIAGNOSIS — S46012S Strain of muscle(s) and tendon(s) of the rotator cuff of left shoulder, sequela: Secondary | ICD-10-CM | POA: Diagnosis not present

## 2021-11-08 DIAGNOSIS — M6281 Muscle weakness (generalized): Secondary | ICD-10-CM | POA: Diagnosis not present

## 2021-11-08 DIAGNOSIS — S46012S Strain of muscle(s) and tendon(s) of the rotator cuff of left shoulder, sequela: Secondary | ICD-10-CM | POA: Diagnosis not present

## 2021-11-10 DIAGNOSIS — S46012S Strain of muscle(s) and tendon(s) of the rotator cuff of left shoulder, sequela: Secondary | ICD-10-CM | POA: Diagnosis not present

## 2021-11-10 DIAGNOSIS — M6281 Muscle weakness (generalized): Secondary | ICD-10-CM | POA: Diagnosis not present

## 2021-11-15 DIAGNOSIS — S46012S Strain of muscle(s) and tendon(s) of the rotator cuff of left shoulder, sequela: Secondary | ICD-10-CM | POA: Diagnosis not present

## 2021-11-15 DIAGNOSIS — M6281 Muscle weakness (generalized): Secondary | ICD-10-CM | POA: Diagnosis not present

## 2021-11-16 DIAGNOSIS — G301 Alzheimer's disease with late onset: Secondary | ICD-10-CM | POA: Diagnosis not present

## 2021-11-16 DIAGNOSIS — G890 Central pain syndrome: Secondary | ICD-10-CM | POA: Diagnosis not present

## 2021-11-17 ENCOUNTER — Other Ambulatory Visit: Payer: Self-pay | Admitting: Internal Medicine

## 2021-11-17 DIAGNOSIS — S46012S Strain of muscle(s) and tendon(s) of the rotator cuff of left shoulder, sequela: Secondary | ICD-10-CM | POA: Diagnosis not present

## 2021-11-17 DIAGNOSIS — M6281 Muscle weakness (generalized): Secondary | ICD-10-CM | POA: Diagnosis not present

## 2021-11-17 NOTE — Progress Notes (Signed)
Remote ICD transmission.   

## 2021-11-21 ENCOUNTER — Ambulatory Visit (INDEPENDENT_AMBULATORY_CARE_PROVIDER_SITE_OTHER): Payer: Medicare Other

## 2021-11-21 DIAGNOSIS — Z95810 Presence of automatic (implantable) cardiac defibrillator: Secondary | ICD-10-CM

## 2021-11-21 DIAGNOSIS — I5022 Chronic systolic (congestive) heart failure: Secondary | ICD-10-CM

## 2021-11-21 NOTE — Progress Notes (Signed)
EPIC Encounter for ICM Monitoring  Patient Name: Jason Russell is a 71 y.o. male Date: 11/21/2021 Primary Care Physican: Lucianne Lei, MD Primary Cardiologist: Harrington Challenger Electrophysiologist: Caryl Comes 09/12/2021 Weight: 280 lbs  10/19/2021 Weight: 280 lbs 11/21/2021 Weight: 280 lbs     Spoke with patient and heart failure questions reviewed.  Pt asymptomatic for fluid accumulation.  He had 2 cookouts over the last week which may contribute to decreased impedance because of salty foods.    CorVue Thoracic impedance suggesting possible fluid accumulation starting 8/10-8/15 and 8/18 but returned close to baseline.   Prescribed:  Furosemide 40 mg take 0.5 tablet (20 mg total) by mouth every other day alternating with 1 tablet (40 mg total) every other day.  Potassium 20 mEq 1 tablet daily. Spironolactone 25 mg take 1 tablet daily   Labs: 10/24/2021 Creatinine 2.89, BUN 52, Potassium 4.2, Sodium 140, GFR 23 10/14/2021 Creatinine 2.27, BUN 36, Potassium 4.0, Sodium 141, GFR 30 08/19/2021 Creatinine 1.81, BUN 24, Potassium 3.8, Sodium 140, GFR 40 07/21/2021 Creatinine 1.81, BUN 27, Potassium 4.4, Sodium 141, GFR 40 A complete set of results can be found in results review   Recommendations:  No changes and encouraged to call if experiencing any fluid symptoms.   Follow-up plan: ICM clinic phone appointment on 12/26/2021.   91 day device clinic remote transmission 01/16/2022.     EP/Cardiology Office Visits:  Next EP appt 10/14/2022 with Dr Lovena Le (no recall).   Copy of ICM check sent to Dr. Caryl Comes.   3 month ICM trend: 11/21/2021.    12-14 Month ICM trend:     [name], RN 11/21/2021 1:58 PM

## 2021-11-22 DIAGNOSIS — S46012S Strain of muscle(s) and tendon(s) of the rotator cuff of left shoulder, sequela: Secondary | ICD-10-CM | POA: Diagnosis not present

## 2021-11-22 DIAGNOSIS — M6281 Muscle weakness (generalized): Secondary | ICD-10-CM | POA: Diagnosis not present

## 2021-11-24 DIAGNOSIS — M6281 Muscle weakness (generalized): Secondary | ICD-10-CM | POA: Diagnosis not present

## 2021-11-24 DIAGNOSIS — S46012S Strain of muscle(s) and tendon(s) of the rotator cuff of left shoulder, sequela: Secondary | ICD-10-CM | POA: Diagnosis not present

## 2021-11-27 ENCOUNTER — Other Ambulatory Visit: Payer: Self-pay | Admitting: Adult Health

## 2021-11-27 ENCOUNTER — Other Ambulatory Visit: Payer: Self-pay | Admitting: Internal Medicine

## 2021-11-27 DIAGNOSIS — I480 Paroxysmal atrial fibrillation: Secondary | ICD-10-CM

## 2021-11-28 NOTE — Telephone Encounter (Signed)
Xarelto 20mg  refill request received. Pt is 71 years old, weight-130.9kg, Crea-2.89 on 10/24/2021, last seen by Dr. Cristopher Peru on 10/13/2021, Diagnosis-Afib, [place].64ml/min, previous CrCl-1/3/02023.  Dose is inappropriate based on dosing criteria. Will discuss with PharmD

## 2021-11-29 DIAGNOSIS — S46012S Strain of muscle(s) and tendon(s) of the rotator cuff of left shoulder, sequela: Secondary | ICD-10-CM | POA: Diagnosis not present

## 2021-11-29 DIAGNOSIS — M6281 Muscle weakness (generalized): Secondary | ICD-10-CM | POA: Diagnosis not present

## 2021-11-29 NOTE — Telephone Encounter (Signed)
This encounter was created in error - please disregard.

## 2021-11-29 NOTE — Telephone Encounter (Signed)
Pt returned call and advised that Dr Lovena Le would like to have labs (kidney function repeated) to evaluate Xarelto dose and he agreed to come on Thursday morning. Pt states he has enough Xarelto until decision is made. Will NOT send refill until labs resulted.

## 2021-11-29 NOTE — Telephone Encounter (Addendum)
Received a message regarding pt's Xarelto dose. Will call pt to have labs repeated. Called pt at 1211pm and there was no answer so left a message for pt to call back [phone]).  Ramond Dial, RPH-CPP  Milania Haubner, Myrtis Hopping, RN      Previous Messages  ----- Message -----  From: Evans Lance, MD  Sent: 11/28/2021   9:34 PM EDT  To: Ramond Dial, RPH-CPP   Lets recheck. If creatinine too high, then switch to eliquis. GT  ----- Message -----  From: Ramond Dial, RPH-CPP  Sent: 11/28/2021   3:01 PM EDT  To: Evans Lance, MD   Pt with worsening renal function.  Looks like new baseline is scr of 2.2 which would keep him at Xarelto 20mg . But most recent scr 2.8 which would require dose adjustment.  Would you like to recheck scr before sending in Rx?  Could also consider changing to Eliquis? If pt would be compliant with BID dosing   Melissa

## 2021-12-01 ENCOUNTER — Ambulatory Visit: Payer: Medicare Other | Attending: Internal Medicine

## 2021-12-01 DIAGNOSIS — I480 Paroxysmal atrial fibrillation: Secondary | ICD-10-CM | POA: Insufficient documentation

## 2021-12-01 DIAGNOSIS — S46012S Strain of muscle(s) and tendon(s) of the rotator cuff of left shoulder, sequela: Secondary | ICD-10-CM | POA: Diagnosis not present

## 2021-12-01 DIAGNOSIS — M6281 Muscle weakness (generalized): Secondary | ICD-10-CM | POA: Diagnosis not present

## 2021-12-02 LAB — BASIC METABOLIC PANEL
BUN/Creatinine Ratio: 16 (ref 10–24)
BUN: 31 mg/dL — ABNORMAL HIGH (ref 8–27)
CO2: 23 mmol/L (ref 20–29)
Calcium: 9.5 mg/dL (ref 8.6–10.2)
Chloride: 109 mmol/L — ABNORMAL HIGH (ref 96–106)
Creatinine, Ser: 1.91 mg/dL — ABNORMAL HIGH (ref 0.76–1.27)
Glucose: 106 mg/dL — ABNORMAL HIGH (ref 70–99)
Potassium: 4.4 mmol/L (ref 3.5–5.2)
Sodium: 142 mmol/L (ref 134–144)
eGFR: 37 mL/min/{1.73_m2} — ABNORMAL LOW (ref >59)

## 2021-12-02 NOTE — Telephone Encounter (Signed)
Pt's labs resulted. Scr 1.91 (12/01/21). CrCl 66.74ml/min  Will send message to [facility] concerning new lab results.

## 2021-12-02 NOTE — Telephone Encounter (Signed)
Confirmed with Marcelle Overlie, PharmD, pt should remain on Xarelto, 20mg  at this time.   Called pt and made him aware.  Appropriate dose and refill sent to requested pharmacy.

## 2021-12-06 DIAGNOSIS — M6281 Muscle weakness (generalized): Secondary | ICD-10-CM | POA: Diagnosis not present

## 2021-12-06 DIAGNOSIS — S46012S Strain of muscle(s) and tendon(s) of the rotator cuff of left shoulder, sequela: Secondary | ICD-10-CM | POA: Diagnosis not present

## 2021-12-08 DIAGNOSIS — M6281 Muscle weakness (generalized): Secondary | ICD-10-CM | POA: Diagnosis not present

## 2021-12-08 DIAGNOSIS — S46012S Strain of muscle(s) and tendon(s) of the rotator cuff of left shoulder, sequela: Secondary | ICD-10-CM | POA: Diagnosis not present

## 2021-12-09 DIAGNOSIS — R7309 Other abnormal glucose: Secondary | ICD-10-CM | POA: Diagnosis not present

## 2021-12-09 DIAGNOSIS — I130 Hypertensive heart and chronic kidney disease with heart failure and stage 1 through stage 4 chronic kidney disease, or unspecified chronic kidney disease: Secondary | ICD-10-CM | POA: Diagnosis not present

## 2021-12-09 DIAGNOSIS — I10 Essential (primary) hypertension: Secondary | ICD-10-CM | POA: Diagnosis not present

## 2021-12-09 DIAGNOSIS — Z6838 Body mass index (BMI) 38.0-38.9, adult: Secondary | ICD-10-CM | POA: Diagnosis not present

## 2021-12-12 DIAGNOSIS — M6281 Muscle weakness (generalized): Secondary | ICD-10-CM | POA: Diagnosis not present

## 2021-12-12 DIAGNOSIS — S46012S Strain of muscle(s) and tendon(s) of the rotator cuff of left shoulder, sequela: Secondary | ICD-10-CM | POA: Diagnosis not present

## 2021-12-13 DIAGNOSIS — C3490 Malignant neoplasm of unspecified part of unspecified bronchus or lung: Secondary | ICD-10-CM | POA: Diagnosis not present

## 2021-12-13 DIAGNOSIS — E440 Moderate protein-calorie malnutrition: Secondary | ICD-10-CM | POA: Diagnosis not present

## 2021-12-13 DIAGNOSIS — E039 Hypothyroidism, unspecified: Secondary | ICD-10-CM | POA: Diagnosis not present

## 2021-12-13 DIAGNOSIS — R7309 Other abnormal glucose: Secondary | ICD-10-CM | POA: Diagnosis not present

## 2021-12-13 DIAGNOSIS — D649 Anemia, unspecified: Secondary | ICD-10-CM | POA: Diagnosis not present

## 2021-12-15 DIAGNOSIS — M6281 Muscle weakness (generalized): Secondary | ICD-10-CM | POA: Diagnosis not present

## 2021-12-15 DIAGNOSIS — S46012S Strain of muscle(s) and tendon(s) of the rotator cuff of left shoulder, sequela: Secondary | ICD-10-CM | POA: Diagnosis not present

## 2021-12-20 DIAGNOSIS — M6281 Muscle weakness (generalized): Secondary | ICD-10-CM | POA: Diagnosis not present

## 2021-12-20 DIAGNOSIS — S46012S Strain of muscle(s) and tendon(s) of the rotator cuff of left shoulder, sequela: Secondary | ICD-10-CM | POA: Diagnosis not present

## 2021-12-22 DIAGNOSIS — M6281 Muscle weakness (generalized): Secondary | ICD-10-CM | POA: Diagnosis not present

## 2021-12-22 DIAGNOSIS — S46012S Strain of muscle(s) and tendon(s) of the rotator cuff of left shoulder, sequela: Secondary | ICD-10-CM | POA: Diagnosis not present

## 2021-12-25 ENCOUNTER — Other Ambulatory Visit: Payer: Self-pay | Admitting: Internal Medicine

## 2021-12-26 ENCOUNTER — Ambulatory Visit (INDEPENDENT_AMBULATORY_CARE_PROVIDER_SITE_OTHER): Payer: Medicare Other

## 2021-12-26 DIAGNOSIS — I5022 Chronic systolic (congestive) heart failure: Secondary | ICD-10-CM

## 2021-12-26 DIAGNOSIS — Z95810 Presence of automatic (implantable) cardiac defibrillator: Secondary | ICD-10-CM

## 2021-12-27 DIAGNOSIS — M6281 Muscle weakness (generalized): Secondary | ICD-10-CM | POA: Diagnosis not present

## 2021-12-27 DIAGNOSIS — S46012S Strain of muscle(s) and tendon(s) of the rotator cuff of left shoulder, sequela: Secondary | ICD-10-CM | POA: Diagnosis not present

## 2021-12-29 NOTE — Progress Notes (Signed)
EPIC Encounter for ICM Monitoring  Patient Name: Jason Russell is a 71 y.o. male Date: 12/29/2021 Primary Care Physican: Lucianne Lei, MD Primary Cardiologist: Harrington Challenger Electrophysiologist: Caryl Comes 09/12/2021 Weight: 280 lbs  10/19/2021 Weight: 280 lbs 12/29/2021 Weight: 280 lbs     Spoke with patient and heart failure questions reviewed.  Pt asymptomatic for fluid accumulation.  He reports feeling well.      CorVue Thoracic impedance normal but was suggesting possible fluid accumulation from 9/17-9/23   Prescribed:  Furosemide 40 mg take 0.5 tablet (20 mg total) by mouth every other day alternating with 1 tablet (40 mg total) every other day.  Potassium 20 mEq 1 tablet daily. Spironolactone 25 mg take 1 tablet daily   Labs: 10/24/2021 Creatinine 2.89, BUN 52, Potassium 4.2, Sodium 140, GFR 23 10/14/2021 Creatinine 2.27, BUN 36, Potassium 4.0, Sodium 141, GFR 30 08/19/2021 Creatinine 1.81, BUN 24, Potassium 3.8, Sodium 140, GFR 40 07/21/2021 Creatinine 1.81, BUN 27, Potassium 4.4, Sodium 141, GFR 40 A complete set of results can be found in results review   Recommendations:  No changes and encouraged to call if experiencing any fluid symptoms.   Follow-up plan: ICM clinic phone appointment on 01/30/2022.   91 day device clinic remote transmission 01/16/2022.     EP/Cardiology Office Visits:  Next EP appt 10/14/2022 with Dr Lovena Le (no recall).   Copy of ICM check sent to Dr. Caryl Comes.    3 month ICM trend: 12/26/2021.    12-14 Month ICM trend:     [name], RN 12/29/2021 12:19 PM

## 2022-01-16 ENCOUNTER — Ambulatory Visit (INDEPENDENT_AMBULATORY_CARE_PROVIDER_SITE_OTHER): Payer: Medicare Other

## 2022-01-16 DIAGNOSIS — I5022 Chronic systolic (congestive) heart failure: Secondary | ICD-10-CM

## 2022-01-17 LAB — CUP PACEART REMOTE DEVICE CHECK
Battery Remaining Longevity: 70 mo
Battery Remaining Percentage: 64 %
Battery Voltage: 2.98 V
Brady Statistic RV Percent Paced: 1.2 %
Date Time Interrogation Session: 20231016020015
HighPow Impedance: 43 Ohm
HighPow Impedance: 43 Ohm
Implantable Lead Implant Date: 20140703
Implantable Lead Location: 753860
Implantable Pulse Generator Implant Date: 20191113
Lead Channel Impedance Value: 410 Ohm
Lead Channel Pacing Threshold Amplitude: 0.5 V
Lead Channel Pacing Threshold Pulse Width: 0.5 ms
Lead Channel Sensing Intrinsic Amplitude: 3.8 mV
Lead Channel Setting Pacing Amplitude: 2.5 V
Lead Channel Setting Pacing Pulse Width: 0.5 ms
Lead Channel Setting Sensing Sensitivity: 0.5 mV
Pulse Gen Serial Number: 9820959

## 2022-01-23 ENCOUNTER — Encounter: Payer: Self-pay | Admitting: Adult Health

## 2022-01-23 ENCOUNTER — Ambulatory Visit (INDEPENDENT_AMBULATORY_CARE_PROVIDER_SITE_OTHER): Payer: Medicare Other | Admitting: Adult Health

## 2022-01-23 DIAGNOSIS — G4733 Obstructive sleep apnea (adult) (pediatric): Secondary | ICD-10-CM | POA: Diagnosis not present

## 2022-01-23 DIAGNOSIS — C3411 Malignant neoplasm of upper lobe, right bronchus or lung: Secondary | ICD-10-CM

## 2022-01-23 DIAGNOSIS — J430 Unilateral pulmonary emphysema [MacLeod's syndrome]: Secondary | ICD-10-CM | POA: Diagnosis not present

## 2022-01-23 DIAGNOSIS — I509 Heart failure, unspecified: Secondary | ICD-10-CM | POA: Diagnosis not present

## 2022-01-23 NOTE — Progress Notes (Signed)
@Patient  ID: Jason Russell, male    DOB: [birth year], 71 y.o.   MRN: [id number]  Chief Complaint  Patient presents with   Follow-up    Referring provider: Lucianne Lei, MD  HPI: 71 year old male followed for severe COPD, severe obstructive sleep apnea and a history of non-small cell lung cancer (stage IIIa non-small cell lung cancer, adenocarcinoma of the right upper lobe lung nodule in addition to mediastinal lymphadenopathy and on April 2018 status post XRT and chemo and immunotherapy) Medical history significant for nonischemic heart disease, cardiomyopathy, congestive heart failure with a EF at 35 to 40%, atrial fibs status post ICD implantation maintained on amiodarone/xarelto.   TEST/EVENTS :  07/31/16: FVC 1.84 L (41%) FEV1 1.14 L (42%) FEV1/FVC 0.77 FEF 25-75 1.17 L (38%) positive bronchodilator response TLC 5.76 L (75%) RV 140% ERV 48% DLCO uncorrected 52%     IMAGING PET CT 07/31/16 Markedly hypermetabolic posterior right upper lobe pulmonary lesion consistent with malignancy and hypermetabolic mediastinal lymph node metastases.   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 This obscures the regional right upper lobe apical lung nodule. No obvious nodular growth or recurrent thoracic adenopathy.   PATHOLOGY FNA RUL 09/06/16:  Adenocarcinoma  EBUS 4R & 7 08/09/16:  Adenocarcinoma    CT chest October 18, 2020 shows stable posttreatment changes of radiation fibrosis in the right hemothorax with no evidence to suggest local recurrence of disease or definite metastatic disease in the thorax.  Very mild emphysema.  CT chest October 25, 2021 showed no new or progressive findings in that chest with evidence of posttreatment change in the right hilum and right paramediastinal lung.  01/23/2022 Follow up : Severe COPD, OSA and Hx of Lung cancer  Patient returns for a 1 year follow-up.  Patient has underlying obstructive sleep apnea is on nocturnal CPAP.   Patient says he cannot sleep without his CPAP.  He wears it every single night.  Typically gets in about 8 to 9 hours.  CPAP download shows excellent compliance with daily average usage at 9 hours.  Patient is on auto CPAP 5 to 20 cm H2O.  Daily average pressure at 16.7 cm H2O.  AHI is 2.7/hour. Patient says his CPAP machine is getting old and wants a new CPAP. Uses a full face mask.   Patient has severe COPD and is maintained on Trelegy inhaler daily.  Says overall breathing is doing okay.  He gets short of breath with heavy activities.  Patient is fully independent.  Patient denies any flare of cough or wheezing. Remains active. Likes to fish. No increased albuterol inhaler.   Has had 3 of stage IIIa non-small cell lung cancer followed by oncology.  Most recent CT chest October 25, 2021 showed no new or progressive findings in the chest.   Has upcoming CT chest in January 2024.   Flu shot up to date.  RSV up to date. PVX and Prevnar 20 utd. Covid booster up to date .      No Known Allergies  Immunization History  Administered Date(s) Administered   Fluad Quad(high Dose 65+) 12/06/2021   Influenza Split 10/27/2016   Influenza, High Dose Seasonal PF 11/14/2016, 11/24/2018   Influenza,inj,Quad PF,6+ Mos 12/08/2015   Influenza,inj,quad, With Preservative 12/03/2019   Influenza-Unspecified 11/02/2014, 12/08/2015, 10/27/2016   PFIZER Comirnaty(Gray Top)Covid-19 Tri-Sucrose Vaccine 05/10/2019, 06/02/2019, 01/03/2020   PFIZER(Purple Top)SARS-COV-2 Vaccination 05/10/2019, 06/02/2019   PNEUMOCOCCAL CONJUGATE-20 12/25/2021   Pneumococcal Conjugate-13 11/14/2016   Pneumococcal Polysaccharide-23 10/16/2013   Pneumococcal-Unspecified 10/27/2016  Past Medical History:  Diagnosis Date   Adenocarcinoma of right lung, stage 3 ([place]) 08/23/2016   Anemia    Arthritis    "hx right hip"   Asthma    "when I was a child"   Atrial fibrillation ([place])    Amiodarone started 10/2011; Coumadin   Automatic  implantable cardioverter-defibrillator in situ 10/03/2012   a. St. Jude ICD implantation 10/03/12.   Chronic anticoagulation    Chronic fatigue 08/04/5463   Chronic systolic heart failure ([place])    a. Echo 7/13: EF 25%;  b. echo 04/2012:  Mild LVH, EF 30-35%, Gr 1 DD, mild AI, mild MR, mild LAE   COPD (chronic obstructive pulmonary disease) (HCC)    Dyslipidemia    Gout    History of blood transfusion 10/15/2013   "don't know where the blood's going; HgB down to 5"   Hyperlipidemia    Hypertension    Hypothyroidism    NICM (nonischemic cardiomyopathy) ([place])    [place] 4/14:  minimal CAD   Obesity    OSA on CPAP    Tobacco abuse     Tobacco History: Social History   Tobacco Use  Smoking Status Former   Packs/day: 0.25   Years: 45.00   Total pack years: 11.25   Types: Cigarettes   Quit date: 07/11/2016   Years since quitting: 5.5  Smokeless Tobacco Never   Counseling given: Not Answered   Outpatient Medications Prior to Visit  Medication Sig Dispense Refill   albuterol (VENTOLIN HFA) 108 (90 Base) MCG/ACT inhaler INHALE 2 PUFFS INTO THE LUNGS EVERY 4 HOURS AS NEEDED FOR WHEEZING OR SHORTNESS OF BREATH. 8.5 g 5   amiodarone (PACERONE) 200 MG tablet Take 1 tablet (200 mg total) by mouth daily. Take daily Except Saturday and Sunday 45 tablet 3   atorvastatin (LIPITOR) 40 MG tablet TAKE 1 TABLET BY MOUTH EVERY DAY 90 tablet 3   bisoprolol (ZEBETA) 5 MG tablet Take 1 tablet (5 mg total) by mouth daily. 30 tablet 11   [employer] D3 2000 units CAPS Take 2,000 Units by mouth daily.   11   ENTRESTO 97-103 MG TAKE 1 TABLET BY MOUTH TWICE A DAY 180 tablet 2   ferrous gluconate (FERGON) 324 MG tablet TAKE 1 TABLET BY MOUTH 3 TIMES A DAY WITH MEALS 270 tablet 3   Fluticasone-Umeclidin-Vilant (TRELEGY ELLIPTA) 100-62.5-25 MCG/ACT AEPB TAKE 1 PUFF BY MOUTH EVERY DAY 180 each 0   furosemide (LASIX) 40 MG tablet Take 1 tablet (40 mg) every other day, alternating with 1/2 tablet (20 mg) every other day. 90  tablet 3   levothyroxine (SYNTHROID) 50 MCG tablet TAKE 1 TABLET BY MOUTH EVERY DAY BEFORE BREAKFAST 90 tablet 3   magnesium oxide (MAG-OX) 400 (241.3 Mg) MG tablet Take 1 tablet (400 mg total) by mouth daily. 30 tablet 0   Polyvinyl Alcohol-Povidone (REFRESH OP) Place 1 drop into both eyes every morning.     Potassium Chloride ER 20 MEQ TBCR TAKE 1 TABLET BY MOUTH EVERY DAY 90 tablet 3   sildenafil (VIAGRA) 100 MG tablet TAKE 1 TABLET BY MOUTH EVERY DAY AS NEEDED FOR ERECTILE DYSFUNCTION 30 tablet 1   spironolactone (ALDACTONE) 25 MG tablet Take 1 tablet (25 mg total) by mouth daily. 90 tablet 3   ULORIC 40 MG tablet Take 1 tablet (40 mg total) by mouth daily. 30 tablet 0   XARELTO 20 MG TABS tablet TAKE 1 TABLET BY MOUTH EVERY DAY 90 tablet 1   No  facility-administered medications prior to visit.     Review of Systems:   Constitutional:   No  weight loss, night sweats,  Fevers, chills,  +fatigue, or  lassitude.  HEENT:   No headaches,  Difficulty swallowing,  Tooth/dental problems, or  Sore throat,                No sneezing, itching, ear ache, nasal congestion, post nasal drip,   CV:  No chest pain,  Orthopnea, PND, swelling in lower extremities, anasarca, dizziness, palpitations, syncope.   GI  No heartburn, indigestion, abdominal pain, nausea, vomiting, diarrhea, change in bowel habits, loss of appetite, bloody stools.   Resp:.  No excess mucus, no productive cough,  No non-productive cough,  No coughing up of blood.  No change in color of mucus.  No wheezing.  No chest wall deformity  Skin: no rash or lesions.  GU: no dysuria, change in color of urine, no urgency or frequency.  No flank pain, no hematuria   MS:  No joint pain or swelling.  No decreased range of motion.  No back pain.    Physical Exam  BP (!) 142/68 (BP Location: Left Arm, Patient Position: Sitting, Cuff Size: Large)   Pulse 75   Temp 98.3 F (36.8 C) (Oral)   Ht 6\' 1"  (1.854 m)   Wt 299 lb 12.8 oz  (136 kg)   SpO2 100%   BMI 39.55 kg/m   GEN: A/Ox3; pleasant , NAD, well nourished    HEENT:  [place]/AT,   NOSE-clear, THROAT-clear, no lesions, no postnasal drip or exudate noted.  Class 3 MP airway   NECK:  Supple w/ fair ROM; no JVD; normal carotid impulses w/o bruits; no thyromegaly or nodules palpated; no lymphadenopathy.    RESP  Clear  P & A; w/o, wheezes/ rales/ or rhonchi. no accessory muscle use, no dullness to percussion  CARD:  RRR, no m/r/g, 1-2+ peripheral edema, pulses intact, no cyanosis or clubbing.  GI:   Soft & nt; nml bowel sounds; no organomegaly or masses detected.   Musco: Warm bil, no deformities or joint swelling noted.   Neuro: alert, no focal deficits noted.    Skin: Warm, no lesions or rashes    Lab Results:    BMET     Imaging: CUP PACEART REMOTE DEVICE CHECK  Result Date: 01/17/2022 Scheduled remote reviewed. Normal device function.  Next remote 91 days. LA        Latest Ref Rng & Units 07/31/2016    9:47 AM  PFT Results  FVC-Pre L 1.84   FVC-Predicted Pre % 41   FVC-Post L 2.32   FVC-Predicted Post % 52   Pre FEV1/FVC % % 77   Post FEV1/FCV % % 73   FEV1-Pre L 1.42   FEV1-Predicted Pre % 42   FEV1-Post L 1.69   DLCO uncorrected ml/min/mmHg 19.27   DLCO UNC% % 52   DLVA Predicted % 109   TLC L 5.76   TLC % Predicted % 75   RV % Predicted % 140     No results found for: "NITRICOXIDE"      Assessment & Plan:   COPD (chronic obstructive pulmonary disease) (HCC) Compensated on present regimen. Vaccines are up-to-date   Plan  Patient Instructions  Continue on TRELEGY daily , rinse after use Activity as tolerated.  Albuterol inhaler as needed  Follow up with Oncology as planned  Continue with surveillance CT scans.  Continue on CPAP at bedtime  Do not drive if sleepy Work on [employer] .  Order for new CPAP.   Follow up with Dr. Lamonte Sakai or Naziyah Tieszen NP In 6 months and As needed       Obstructive sleep apnea  (adult) (pediatric) Excellent control and compliance on nocturnal CPAP.  Patient CPAP machine is getting old.  Order for new CPAP machine.  Plan  Patient Instructions  Continue on TRELEGY daily , rinse after use Activity as tolerated.  Albuterol inhaler as needed  Follow up with Oncology as planned  Continue with surveillance CT scans.  Continue on CPAP at bedtime Do not drive if sleepy Work on [employer] .  Order for new CPAP.   Follow up with Dr. Lamonte Sakai or Makhya Arave NP In 6 months and As needed       CHF (congestive heart failure) ([place]) Appears euvolemic on exam.  Continue current regimen with cardiology  Primary cancer of right upper lobe of lung ([place]) History of stage IIIa lung cancer status post concurrent chemoradiation in 2018 along with immunotherapy.  Patient is followed by oncology most recent CT chest July 2023 showed no evidence of recurrence or metastatic disease.  Continue follow-up with oncology and serial CT chest     Sumiya Mamaril, NP 01/23/2022

## 2022-01-23 NOTE — Assessment & Plan Note (Signed)
Compensated on present regimen. Vaccines are up-to-date   Plan  Patient Instructions  Continue on TRELEGY daily , rinse after use Activity as tolerated.  Albuterol inhaler as needed  Follow up with Oncology as planned  Continue with surveillance CT scans.  Continue on CPAP at bedtime Do not drive if sleepy Work on [employer] .  Order for new CPAP.   Follow up with Dr. Lamonte Sakai or Craige Patel NP In 6 months and As needed

## 2022-01-23 NOTE — Assessment & Plan Note (Addendum)
Excellent control and compliance on nocturnal CPAP.  Patient CPAP machine is getting old.  Order for new CPAP machine.  Plan  Patient Instructions  Continue on TRELEGY daily , rinse after use Activity as tolerated.  Albuterol inhaler as needed  Follow up with Oncology as planned  Continue with surveillance CT scans.  Continue on CPAP at bedtime Do not drive if sleepy Work on [employer] .  Order for new CPAP.   Follow up with Dr. Lamonte Sakai or Letrell Attwood NP In 6 months and As needed

## 2022-01-23 NOTE — Addendum Note (Signed)
Addended by: Vanessa Barbara on: 01/23/2022 11:16 AM   Modules accepted: Orders

## 2022-01-23 NOTE — Assessment & Plan Note (Signed)
History of stage IIIa lung cancer status post concurrent chemoradiation in 2018 along with immunotherapy.  Patient is followed by oncology most recent CT chest July 2023 showed no evidence of recurrence or metastatic disease.  Continue follow-up with oncology and serial CT chest

## 2022-01-23 NOTE — Assessment & Plan Note (Signed)
Appears euvolemic on exam.  Continue current regimen with cardiology

## 2022-01-23 NOTE — Patient Instructions (Addendum)
Continue on TRELEGY daily , rinse after use Activity as tolerated.  Albuterol inhaler as needed  Follow up with Oncology as planned  Continue with surveillance CT scans.  Continue on CPAP at bedtime Do not drive if sleepy Work on [employer] .  Order for new CPAP.   Follow up with Dr. Lamonte Sakai or Peggyann Zwiefelhofer NP In 6 months and As needed

## 2022-01-30 ENCOUNTER — Ambulatory Visit (INDEPENDENT_AMBULATORY_CARE_PROVIDER_SITE_OTHER): Payer: Medicare Other

## 2022-01-30 DIAGNOSIS — Z95810 Presence of automatic (implantable) cardiac defibrillator: Secondary | ICD-10-CM

## 2022-01-30 DIAGNOSIS — I5022 Chronic systolic (congestive) heart failure: Secondary | ICD-10-CM

## 2022-02-01 NOTE — Progress Notes (Signed)
EPIC Encounter for ICM Monitoring  Patient Name: Jason Russell is a 71 y.o. male Date: 02/01/2022 Primary Care Physican: Lucianne Lei, MD Primary Cardiologist: Harrington Challenger Electrophysiologist: Lovena Le 09/12/2021 Weight: 280 lbs  10/19/2021 Weight: 280 lbs 12/29/2021 Weight: 280 lbs 02/01/2022 Weight: 280 lbs     Spoke with patient and heart failure questions reviewed.  Transmission results reviewed.  Pt asymptomatic for fluid accumulation.  Reports feeling well at this time and voices no complaints.    He was out of town during decreased impedance which may have contributed to fluid accumulation.    CorVue Thoracic impedance normal but was suggesting possible fluid accumulation from 9/30-10/9.  Also suggesting intermittent possible dryness from 10/9-10/13.   Prescribed:  Furosemide 40 mg take 0.5 tablet (20 mg total) by mouth every other day alternating with 1 tablet (40 mg total) every other day.  Potassium 20 mEq 1 tablet daily. Spironolactone 25 mg take 1 tablet daily   Labs: 10/24/2021 Creatinine 2.89, BUN 52, Potassium 4.2, Sodium 140, GFR 23 10/14/2021 Creatinine 2.27, BUN 36, Potassium 4.0, Sodium 141, GFR 30 08/19/2021 Creatinine 1.81, BUN 24, Potassium 3.8, Sodium 140, GFR 40 07/21/2021 Creatinine 1.81, BUN 27, Potassium 4.4, Sodium 141, GFR 40 A complete set of results can be found in results review   Recommendations:  No changes and encouraged to call if experiencing any fluid symptoms.   Follow-up plan: ICM clinic phone appointment on 03/06/2022.   91 day device clinic remote transmission 04/17/2022.     EP/Cardiology Office Visits:  10/19/2022 with Dr Lovena Le.   Copy of ICM check sent to Dr. Lovena Le.    3 month ICM trend: 01/30/2022.    12-14 Month ICM trend:     [name], RN 02/01/2022 8:34 AM

## 2022-02-03 ENCOUNTER — Telehealth: Payer: Self-pay | Admitting: Adult Health

## 2022-02-06 NOTE — Progress Notes (Signed)
Remote ICD transmission.   

## 2022-02-10 NOTE — Telephone Encounter (Signed)
Message was routed to provider as a FYI. Will close encounter.

## 2022-02-15 DIAGNOSIS — M79643 Pain in unspecified hand: Secondary | ICD-10-CM | POA: Diagnosis not present

## 2022-02-15 DIAGNOSIS — I700 Atherosclerosis of aorta: Secondary | ICD-10-CM | POA: Diagnosis not present

## 2022-02-15 DIAGNOSIS — M47814 Spondylosis without myelopathy or radiculopathy, thoracic region: Secondary | ICD-10-CM | POA: Diagnosis not present

## 2022-02-15 DIAGNOSIS — J701 Chronic and other pulmonary manifestations due to radiation: Secondary | ICD-10-CM | POA: Diagnosis not present

## 2022-02-15 DIAGNOSIS — M4807 Spinal stenosis, lumbosacral region: Secondary | ICD-10-CM | POA: Diagnosis not present

## 2022-02-15 DIAGNOSIS — S3992XA Unspecified injury of lower back, initial encounter: Secondary | ICD-10-CM | POA: Diagnosis not present

## 2022-02-15 DIAGNOSIS — S299XXA Unspecified injury of thorax, initial encounter: Secondary | ICD-10-CM | POA: Diagnosis not present

## 2022-02-15 DIAGNOSIS — S3991XA Unspecified injury of abdomen, initial encounter: Secondary | ICD-10-CM | POA: Diagnosis not present

## 2022-02-15 DIAGNOSIS — S20212A Contusion of left front wall of thorax, initial encounter: Secondary | ICD-10-CM | POA: Diagnosis not present

## 2022-02-15 DIAGNOSIS — N289 Disorder of kidney and ureter, unspecified: Secondary | ICD-10-CM | POA: Diagnosis not present

## 2022-02-15 DIAGNOSIS — M79641 Pain in right hand: Secondary | ICD-10-CM | POA: Diagnosis not present

## 2022-02-15 DIAGNOSIS — S61411A Laceration without foreign body of right hand, initial encounter: Secondary | ICD-10-CM | POA: Diagnosis not present

## 2022-02-15 DIAGNOSIS — M542 Cervicalgia: Secondary | ICD-10-CM | POA: Diagnosis not present

## 2022-02-15 DIAGNOSIS — Z7901 Long term (current) use of anticoagulants: Secondary | ICD-10-CM | POA: Diagnosis not present

## 2022-02-15 DIAGNOSIS — R109 Unspecified abdominal pain: Secondary | ICD-10-CM | POA: Diagnosis not present

## 2022-02-15 DIAGNOSIS — R930 Abnormal findings on diagnostic imaging of skull and head, not elsewhere classified: Secondary | ICD-10-CM | POA: Diagnosis not present

## 2022-02-15 DIAGNOSIS — J941 Fibrothorax: Secondary | ICD-10-CM | POA: Diagnosis not present

## 2022-02-15 DIAGNOSIS — N200 Calculus of kidney: Secondary | ICD-10-CM | POA: Diagnosis not present

## 2022-02-15 DIAGNOSIS — N281 Cyst of kidney, acquired: Secondary | ICD-10-CM | POA: Diagnosis not present

## 2022-02-15 DIAGNOSIS — R519 Headache, unspecified: Secondary | ICD-10-CM | POA: Diagnosis not present

## 2022-02-15 DIAGNOSIS — S3993XA Unspecified injury of pelvis, initial encounter: Secondary | ICD-10-CM | POA: Diagnosis not present

## 2022-02-15 DIAGNOSIS — I959 Hypotension, unspecified: Secondary | ICD-10-CM | POA: Diagnosis not present

## 2022-02-15 DIAGNOSIS — M2578 Osteophyte, vertebrae: Secondary | ICD-10-CM | POA: Diagnosis not present

## 2022-02-15 DIAGNOSIS — T68XXXA Hypothermia, initial encounter: Secondary | ICD-10-CM | POA: Diagnosis not present

## 2022-02-15 DIAGNOSIS — M48061 Spinal stenosis, lumbar region without neurogenic claudication: Secondary | ICD-10-CM | POA: Diagnosis not present

## 2022-02-17 DIAGNOSIS — N183 Chronic kidney disease, stage 3 unspecified: Secondary | ICD-10-CM | POA: Diagnosis not present

## 2022-02-17 DIAGNOSIS — I129 Hypertensive chronic kidney disease with stage 1 through stage 4 chronic kidney disease, or unspecified chronic kidney disease: Secondary | ICD-10-CM | POA: Diagnosis not present

## 2022-02-21 DIAGNOSIS — I129 Hypertensive chronic kidney disease with stage 1 through stage 4 chronic kidney disease, or unspecified chronic kidney disease: Secondary | ICD-10-CM | POA: Diagnosis not present

## 2022-02-21 DIAGNOSIS — C3411 Malignant neoplasm of upper lobe, right bronchus or lung: Secondary | ICD-10-CM | POA: Diagnosis not present

## 2022-02-21 DIAGNOSIS — E559 Vitamin D deficiency, unspecified: Secondary | ICD-10-CM | POA: Diagnosis not present

## 2022-02-21 DIAGNOSIS — D631 Anemia in chronic kidney disease: Secondary | ICD-10-CM | POA: Diagnosis not present

## 2022-02-21 DIAGNOSIS — N183 Chronic kidney disease, stage 3 unspecified: Secondary | ICD-10-CM | POA: Diagnosis not present

## 2022-02-21 DIAGNOSIS — N1832 Chronic kidney disease, stage 3b: Secondary | ICD-10-CM | POA: Diagnosis not present

## 2022-02-21 DIAGNOSIS — M898X9 Other specified disorders of bone, unspecified site: Secondary | ICD-10-CM | POA: Diagnosis not present

## 2022-03-02 DIAGNOSIS — I4891 Unspecified atrial fibrillation: Secondary | ICD-10-CM | POA: Diagnosis not present

## 2022-03-02 DIAGNOSIS — I10 Essential (primary) hypertension: Secondary | ICD-10-CM | POA: Diagnosis not present

## 2022-03-03 ENCOUNTER — Other Ambulatory Visit: Payer: Self-pay | Admitting: Internal Medicine

## 2022-03-06 ENCOUNTER — Ambulatory Visit (INDEPENDENT_AMBULATORY_CARE_PROVIDER_SITE_OTHER): Payer: Medicare Other

## 2022-03-06 DIAGNOSIS — Z95810 Presence of automatic (implantable) cardiac defibrillator: Secondary | ICD-10-CM

## 2022-03-06 DIAGNOSIS — I5022 Chronic systolic (congestive) heart failure: Secondary | ICD-10-CM | POA: Diagnosis not present

## 2022-03-10 NOTE — Progress Notes (Signed)
EPIC Encounter for ICM Monitoring  Patient Name: Jason Russell is a 71 y.o. male Date: 03/10/2022 Primary Care Physican: Lucianne Lei, MD Primary Cardiologist: Harrington Challenger Electrophysiologist: Lovena Le 09/12/2021 Weight: 280 lbs  10/19/2021 Weight: 280 lbs 12/29/2021 Weight: 280 lbs 02/01/2022 Weight: 280 lbs     Spoke with patient and heart failure questions reviewed.  Transmission results reviewed.  Pt asymptomatic for fluid accumulation.  Reports feeling well at this time and voices no complaints.       CorVue Thoracic impedance suggesting normal fluid levels.   Prescribed:  Furosemide 40 mg take 0.5 tablet (20 mg total) by mouth every other day alternating with 1 tablet (40 mg total) every other day.  Potassium 20 mEq 1 tablet daily. Spironolactone 25 mg take 1 tablet daily   Labs: 12/01/2021 Creatinine 1.91, BUN 31, Potassium 4.4, Sodium 142, GFR 37 10/24/2021 Creatinine 2.89, BUN 52, Potassium 4.2, Sodium 140, GFR 23 10/14/2021 Creatinine 2.27, BUN 36, Potassium 4.0, Sodium 141, GFR 30 08/19/2021 Creatinine 1.81, BUN 24, Potassium 3.8, Sodium 140, GFR 40 07/21/2021 Creatinine 1.81, BUN 27, Potassium 4.4, Sodium 141, GFR 40 A complete set of results can be found in results review   Recommendations:  No changes and encouraged to call if experiencing any fluid symptoms.   Follow-up plan: ICM clinic phone appointment on 04/18/2022   91 day device clinic remote transmission 04/17/2022.     EP/Cardiology Office Visits:  10/19/2022 with Dr Lovena Le.   Copy of ICM check sent to Dr. Lovena Le.     3 month ICM trend: 03/06/2022.    12-14 Month ICM trend:     [name], RN 03/10/2022 4:07 PM

## 2022-03-28 ENCOUNTER — Other Ambulatory Visit: Payer: Self-pay | Admitting: Internal Medicine

## 2022-03-28 ENCOUNTER — Other Ambulatory Visit: Payer: Self-pay | Admitting: Adult Health

## 2022-03-28 NOTE — Telephone Encounter (Signed)
Pt's pharmacy is requesting a refill on sildenafil. Would Dr. Lovena Le like to refill this medication? Please address

## 2022-04-05 NOTE — Telephone Encounter (Signed)
Pt's medication was sent to pt's pharmacy as requested. Confirmation received.  °

## 2022-04-17 ENCOUNTER — Ambulatory Visit (INDEPENDENT_AMBULATORY_CARE_PROVIDER_SITE_OTHER): Payer: Medicare Other

## 2022-04-17 DIAGNOSIS — I5022 Chronic systolic (congestive) heart failure: Secondary | ICD-10-CM | POA: Diagnosis not present

## 2022-04-18 LAB — CUP PACEART REMOTE DEVICE CHECK
Battery Remaining Longevity: 68 mo
Battery Remaining Percentage: 61 %
Battery Voltage: 2.98 V
Brady Statistic RV Percent Paced: 1.2 %
Date Time Interrogation Session: 20240115020033
HighPow Impedance: 42 Ohm
HighPow Impedance: 42 Ohm
Implantable Lead Connection Status: 753985
Implantable Lead Implant Date: 20140703
Implantable Lead Location: 753860
Implantable Pulse Generator Implant Date: 20191113
Lead Channel Impedance Value: 380 Ohm
Lead Channel Pacing Threshold Amplitude: 0.5 V
Lead Channel Pacing Threshold Pulse Width: 0.5 ms
Lead Channel Sensing Intrinsic Amplitude: 3.5 mV
Lead Channel Setting Pacing Amplitude: 2.5 V
Lead Channel Setting Pacing Pulse Width: 0.5 ms
Lead Channel Setting Sensing Sensitivity: 0.5 mV
Pulse Gen Serial Number: 9820959
Zone Setting Status: 755011

## 2022-04-25 ENCOUNTER — Ambulatory Visit: Payer: Medicare Other | Attending: Internal Medicine

## 2022-04-25 ENCOUNTER — Telehealth: Payer: Self-pay

## 2022-04-25 DIAGNOSIS — I5022 Chronic systolic (congestive) heart failure: Secondary | ICD-10-CM

## 2022-04-25 DIAGNOSIS — Z95810 Presence of automatic (implantable) cardiac defibrillator: Secondary | ICD-10-CM | POA: Diagnosis not present

## 2022-04-25 NOTE — Progress Notes (Signed)
EPIC Encounter for ICM Monitoring  Patient Name: Jason Russell is a 72 y.o. male Date: 04/25/2022 Primary Care Physican: Renaye Rakers, MD Primary Cardiologist: Tenny Craw Electrophysiologist: Ladona Ridgel 09/12/2021 Weight: 280 lbs  10/19/2021 Weight: 280 lbs 12/29/2021 Weight: 280 lbs 02/01/2022 Weight: 280 lbs     Attempted call to patient and unable to reach.  Left detailed message per DPR regarding transmission. Transmission reviewed.     CorVue Thoracic impedance suggesting normal fluid levels.   Prescribed:  Furosemide 40 mg take 0.5 tablet (20 mg total) by mouth every other day alternating with 1 tablet (40 mg total) every other day.  Potassium 20 mEq 1 tablet daily. Spironolactone 25 mg take 1 tablet daily   Labs: 02/17/2022 Creatinine 2.07, BUN 23, Potassium 4.4, Sodium 141, GFR 34 02/15/2022 Creatinine 1.88, BUN 28, Potassium 4.2, Sodium 141, GFR 38 12/01/2021 Creatinine 1.91, BUN 31, Potassium 4.4, Sodium 142, GFR 37 10/24/2021 Creatinine 2.89, BUN 52, Potassium 4.2, Sodium 140, GFR 23 10/14/2021 Creatinine 2.27, BUN 36, Potassium 4.0, Sodium 141, GFR 30 08/19/2021 Creatinine 1.81, BUN 24, Potassium 3.8, Sodium 140, GFR 40 07/21/2021 Creatinine 1.81, BUN 27, Potassium 4.4, Sodium 141, GFR 40 A complete set of results can be found in results review   Recommendations:  Left voice mail with ICM number and encouraged to call if experiencing any fluid symptoms.   Follow-up plan: ICM clinic phone appointment on 05/29/2022   91 day device clinic remote transmission 07/17/2022.     EP/Cardiology Office Visits:  Recall 10/19/2022 with Dr Ladona Ridgel.   Copy of ICM check sent to Dr. Ladona Ridgel.     3 month ICM trend: 04/25/2022.    12-14 Month ICM trend:     Karie Soda, RN 04/25/2022 8:22 AM

## 2022-04-25 NOTE — Telephone Encounter (Signed)
Remote ICM transmission received.  Attempted call to patient regarding ICM remote transmission and left detailed message per DPR.  Advised to return call for any fluid symptoms or questions. Next ICM remote transmission scheduled 05/29/2022.

## 2022-04-27 ENCOUNTER — Other Ambulatory Visit: Payer: Self-pay | Admitting: Internal Medicine

## 2022-05-02 ENCOUNTER — Other Ambulatory Visit: Payer: Self-pay | Admitting: Internal Medicine

## 2022-05-03 ENCOUNTER — Other Ambulatory Visit: Payer: Self-pay | Admitting: Physician Assistant

## 2022-05-03 MED ORDER — AMIODARONE HCL 200 MG PO TABS: 200.0000 mg | ORAL_TABLET | Freq: Every day | ORAL | 1 refills | Status: DC

## 2022-05-04 DIAGNOSIS — H04123 Dry eye syndrome of bilateral lacrimal glands: Secondary | ICD-10-CM | POA: Diagnosis not present

## 2022-05-04 DIAGNOSIS — H40013 Open angle with borderline findings, low risk, bilateral: Secondary | ICD-10-CM | POA: Diagnosis not present

## 2022-05-04 DIAGNOSIS — H2513 Age-related nuclear cataract, bilateral: Secondary | ICD-10-CM | POA: Diagnosis not present

## 2022-05-18 DIAGNOSIS — I130 Hypertensive heart and chronic kidney disease with heart failure and stage 1 through stage 4 chronic kidney disease, or unspecified chronic kidney disease: Secondary | ICD-10-CM | POA: Diagnosis not present

## 2022-05-18 DIAGNOSIS — E039 Hypothyroidism, unspecified: Secondary | ICD-10-CM | POA: Diagnosis not present

## 2022-05-18 DIAGNOSIS — R7303 Prediabetes: Secondary | ICD-10-CM | POA: Diagnosis not present

## 2022-05-18 DIAGNOSIS — E559 Vitamin D deficiency, unspecified: Secondary | ICD-10-CM | POA: Diagnosis not present

## 2022-05-18 DIAGNOSIS — N1832 Chronic kidney disease, stage 3b: Secondary | ICD-10-CM | POA: Diagnosis not present

## 2022-05-18 DIAGNOSIS — E785 Hyperlipidemia, unspecified: Secondary | ICD-10-CM | POA: Diagnosis not present

## 2022-05-26 DIAGNOSIS — H25811 Combined forms of age-related cataract, right eye: Secondary | ICD-10-CM | POA: Diagnosis not present

## 2022-05-29 ENCOUNTER — Ambulatory Visit: Payer: Medicare Other | Attending: Internal Medicine

## 2022-05-29 DIAGNOSIS — I5022 Chronic systolic (congestive) heart failure: Secondary | ICD-10-CM | POA: Diagnosis not present

## 2022-05-29 DIAGNOSIS — Z95810 Presence of automatic (implantable) cardiac defibrillator: Secondary | ICD-10-CM

## 2022-05-30 NOTE — Progress Notes (Signed)
Remote ICD transmission.   

## 2022-06-01 DIAGNOSIS — N1832 Chronic kidney disease, stage 3b: Secondary | ICD-10-CM | POA: Diagnosis not present

## 2022-06-01 DIAGNOSIS — N189 Chronic kidney disease, unspecified: Secondary | ICD-10-CM | POA: Diagnosis not present

## 2022-06-01 DIAGNOSIS — I130 Hypertensive heart and chronic kidney disease with heart failure and stage 1 through stage 4 chronic kidney disease, or unspecified chronic kidney disease: Secondary | ICD-10-CM | POA: Diagnosis not present

## 2022-06-01 DIAGNOSIS — I4891 Unspecified atrial fibrillation: Secondary | ICD-10-CM | POA: Diagnosis not present

## 2022-06-01 DIAGNOSIS — Z7901 Long term (current) use of anticoagulants: Secondary | ICD-10-CM | POA: Diagnosis not present

## 2022-06-02 ENCOUNTER — Other Ambulatory Visit: Payer: Self-pay | Admitting: Adult Health

## 2022-06-02 ENCOUNTER — Other Ambulatory Visit: Payer: Self-pay | Admitting: Physician Assistant

## 2022-06-02 ENCOUNTER — Other Ambulatory Visit: Payer: Self-pay | Admitting: Internal Medicine

## 2022-06-02 DIAGNOSIS — I480 Paroxysmal atrial fibrillation: Secondary | ICD-10-CM

## 2022-06-02 NOTE — Progress Notes (Signed)
EPIC Encounter for ICM Monitoring  Patient Name: Jason Russell is a 72 y.o. male Date: 06/02/2022 Primary Care Physican: Lucianne Lei, MD Primary Cardiologist: Harrington Challenger Electrophysiologist: Lovena Le 06/02/2022 Weight: 280 lbs   Spoke with patient and heart failure questions reviewed.  Transmission results reviewed.  Pt asymptomatic for fluid accumulation.  Reports feeling well at this time and voices no complaints.       CorVue Thoracic impedance suggesting normal fluid levels with the exception of possible fluid accumulation from 1/31-2/10.   Prescribed:  Furosemide 40 mg take 0.5 tablet (20 mg total) by mouth every other day alternating with 1 tablet (40 mg total) every other day.  Potassium 20 mEq 1 tablet daily. Spironolactone 25 mg take 1 tablet daily   Labs: 02/17/2022 Creatinine 2.07, BUN 23, Potassium 4.4, Sodium 141, GFR 34 02/15/2022 Creatinine 1.88, BUN 28, Potassium 4.2, Sodium 141, GFR 38 12/01/2021 Creatinine 1.91, BUN 31, Potassium 4.4, Sodium 142, GFR 37 10/24/2021 Creatinine 2.89, BUN 52, Potassium 4.2, Sodium 140, GFR 23 10/14/2021 Creatinine 2.27, BUN 36, Potassium 4.0, Sodium 141, GFR 30 08/19/2021 Creatinine 1.81, BUN 24, Potassium 3.8, Sodium 140, GFR 40 07/21/2021 Creatinine 1.81, BUN 27, Potassium 4.4, Sodium 141, GFR 40 A complete set of results can be found in results review   Recommendations:  No changes and encouraged to call if experiencing any fluid symptoms.   Follow-up plan: ICM clinic phone appointment on 07/03/2022   91 day device clinic remote transmission 07/17/2022.     EP/Cardiology Office Visits:  Recall 10/19/2022 with Dr Lovena Le.   Copy of ICM check sent to Dr. Lovena Le.   3 month ICM trend: 05/29/2022.    12-14 Month ICM trend:     [name], RN 06/02/2022 2:25 PM

## 2022-06-02 NOTE — Telephone Encounter (Signed)
Pt pharmacy is requesting a refill on Sildenafil and Xarelto. Would Dr. Lovena Le like to refill Sildenafil? Please address

## 2022-06-02 NOTE — Telephone Encounter (Signed)
Xarelto '20mg'$  refill request received. Pt is 72 years old, weight-136kg, Crea-1.91 on 12/01/21, last seen by Dr. Lovena Le on 10/13/21, Diagnosis-Afib, CrCl-69.23 mL/min; Dose is appropriate based on dosing criteria. Will send in refill to requested pharmacy.

## 2022-06-05 NOTE — Telephone Encounter (Signed)
Refill request signed for sildenafil forwarded to Dr. Lovena Le

## 2022-06-09 DIAGNOSIS — H25812 Combined forms of age-related cataract, left eye: Secondary | ICD-10-CM | POA: Diagnosis not present

## 2022-07-03 ENCOUNTER — Ambulatory Visit: Payer: Medicare Other | Attending: Internal Medicine

## 2022-07-03 DIAGNOSIS — Z95810 Presence of automatic (implantable) cardiac defibrillator: Secondary | ICD-10-CM | POA: Diagnosis not present

## 2022-07-03 DIAGNOSIS — I5022 Chronic systolic (congestive) heart failure: Secondary | ICD-10-CM | POA: Diagnosis not present

## 2022-07-04 NOTE — Progress Notes (Signed)
EPIC Encounter for ICM Monitoring  Patient Name: Jason Russell is a 72 y.o. male Date: 07/04/2022 Primary Care Physican: Lucianne Lei, MD Primary Cardiologist: Harrington Challenger Electrophysiologist: Lovena Le 06/02/2022 Weight: 280 lbs 07/03/2022 Weight: 280 lbs   Spoke with patient and heart failure questions reviewed.  Transmission results reviewed.  Pt asymptomatic for fluid accumulation.  Reports feeling well at this time and voices no complaints.  [place] which correlates with dryness.  He does not drink a lot of fluids when on the boat.  Advised to drink water if he feels dehydration symptoms such as dizziness or lightheadedness.     CorVue Thoracic impedance suggesting possible dryness starting 3/29.   Prescribed:  Furosemide 40 mg take 0.5 tablet (20 mg total) by mouth every other day alternating with 1 tablet (40 mg total) every other day.  Potassium 20 mEq 1 tablet daily. Spironolactone 25 mg take 1 tablet daily   Labs: 02/17/2022 Creatinine 2.07, BUN 23, Potassium 4.4, Sodium 141, GFR 34 02/15/2022 Creatinine 1.88, BUN 28, Potassium 4.2, Sodium 141, GFR 38 12/01/2021 Creatinine 1.91, BUN 31, Potassium 4.4, Sodium 142, GFR 37 10/24/2021 Creatinine 2.89, BUN 52, Potassium 4.2, Sodium 140, GFR 23 10/14/2021 Creatinine 2.27, BUN 36, Potassium 4.0, Sodium 141, GFR 30 08/19/2021 Creatinine 1.81, BUN 24, Potassium 3.8, Sodium 140, GFR 40 07/21/2021 Creatinine 1.81, BUN 27, Potassium 4.4, Sodium 141, GFR 40 A complete set of results can be found in results review   Recommendations:  No changes and encouraged to call if experiencing any fluid symptoms.   Follow-up plan: ICM clinic phone appointment on 08/07/2022   91 day device clinic remote transmission 07/17/2022.     EP/Cardiology Office Visits:  Recall 10/19/2022 with Dr Lovena Le.   Copy of ICM check sent to Dr. Lovena Le.   3 month ICM trend: 07/03/2022.    12-14 Month ICM trend:     [name],  RN 07/04/2022 3:58 PM

## 2022-07-13 DIAGNOSIS — N1832 Chronic kidney disease, stage 3b: Secondary | ICD-10-CM | POA: Diagnosis not present

## 2022-07-13 DIAGNOSIS — E039 Hypothyroidism, unspecified: Secondary | ICD-10-CM | POA: Diagnosis not present

## 2022-07-13 DIAGNOSIS — I130 Hypertensive heart and chronic kidney disease with heart failure and stage 1 through stage 4 chronic kidney disease, or unspecified chronic kidney disease: Secondary | ICD-10-CM | POA: Diagnosis not present

## 2022-07-13 DIAGNOSIS — I110 Hypertensive heart disease with heart failure: Secondary | ICD-10-CM | POA: Diagnosis not present

## 2022-07-17 ENCOUNTER — Ambulatory Visit (INDEPENDENT_AMBULATORY_CARE_PROVIDER_SITE_OTHER): Payer: Medicare Other

## 2022-07-17 DIAGNOSIS — I429 Cardiomyopathy, unspecified: Secondary | ICD-10-CM

## 2022-07-17 DIAGNOSIS — I5022 Chronic systolic (congestive) heart failure: Secondary | ICD-10-CM | POA: Diagnosis not present

## 2022-07-18 LAB — CUP PACEART REMOTE DEVICE CHECK
Battery Remaining Longevity: 65 mo
Battery Remaining Percentage: 59 %
Battery Voltage: 2.98 V
Brady Statistic RV Percent Paced: 1.2 %
Date Time Interrogation Session: 20240415020020
HighPow Impedance: 43 Ohm
HighPow Impedance: 43 Ohm
Implantable Lead Connection Status: 753985
Implantable Lead Implant Date: 20140703
Implantable Lead Location: 753860
Implantable Pulse Generator Implant Date: 20191113
Lead Channel Impedance Value: 390 Ohm
Lead Channel Pacing Threshold Amplitude: 0.5 V
Lead Channel Pacing Threshold Pulse Width: 0.5 ms
Lead Channel Sensing Intrinsic Amplitude: 2.8 mV
Lead Channel Setting Pacing Amplitude: 2.5 V
Lead Channel Setting Pacing Pulse Width: 0.5 ms
Lead Channel Setting Sensing Sensitivity: 0.5 mV
Pulse Gen Serial Number: 9820959
Zone Setting Status: 755011

## 2022-08-07 ENCOUNTER — Ambulatory Visit: Payer: Medicare Other | Attending: Internal Medicine

## 2022-08-07 DIAGNOSIS — I5022 Chronic systolic (congestive) heart failure: Secondary | ICD-10-CM | POA: Diagnosis not present

## 2022-08-07 DIAGNOSIS — Z95810 Presence of automatic (implantable) cardiac defibrillator: Secondary | ICD-10-CM | POA: Diagnosis not present

## 2022-08-11 ENCOUNTER — Telehealth: Payer: Self-pay

## 2022-08-11 NOTE — Telephone Encounter (Signed)
Remote ICM transmission received.  Attempted call to patient regarding ICM remote transmission and left detailed message per DPR.  Advised to return call for any fluid symptoms or questions. Next ICM remote transmission scheduled 09/11/2022.    

## 2022-08-11 NOTE — Progress Notes (Signed)
EPIC Encounter for ICM Monitoring  Patient Name: Jason Russell is a 72 y.o. male Date: 08/11/2022 Primary Care Physican: Renaye Rakers, MD Primary Cardiologist: Tenny Craw Electrophysiologist: Ladona Ridgel 06/02/2022 Weight: 280 lbs 07/03/2022 Weight: 280 lbs   Attempted call to patient and unable to reach.  Left detailed message per DPR regarding transmission. Transmission reviewed.    CorVue Thoracic impedance suggesting normal fluid levels with the exception of possible fluid accumulation from 4/6-4/14.   Prescribed:  Furosemide 40 mg take 0.5 tablet (20 mg total) by mouth every other day alternating with 1 tablet (40 mg total) every other day.  Potassium 20 mEq 1 tablet daily. Spironolactone 25 mg take 1 tablet daily   Labs: 02/17/2022 Creatinine 2.07, BUN 23, Potassium 4.4, Sodium 141, GFR 34 02/15/2022 Creatinine 1.88, BUN 28, Potassium 4.2, Sodium 141, GFR 38 12/01/2021 Creatinine 1.91, BUN 31, Potassium 4.4, Sodium 142, GFR 37 10/24/2021 Creatinine 2.89, BUN 52, Potassium 4.2, Sodium 140, GFR 23 10/14/2021 Creatinine 2.27, BUN 36, Potassium 4.0, Sodium 141, GFR 30 08/19/2021 Creatinine 1.81, BUN 24, Potassium 3.8, Sodium 140, GFR 40 07/21/2021 Creatinine 1.81, BUN 27, Potassium 4.4, Sodium 141, GFR 40 A complete set of results can be found in results review   Recommendations:  Left voice mail with ICM number and encouraged to call if experiencing any fluid symptoms.   Follow-up plan: ICM clinic phone appointment on 09/11/2022   91 day device clinic remote transmission 10/16/2022.     EP/Cardiology Office Visits:  Recall 10/19/2022 with Dr Ladona Ridgel.   Copy of ICM check sent to Dr. Ladona Ridgel.   3 month ICM trend: 08/09/2022.    12-14 Month ICM trend:     Karie Soda, RN 08/11/2022 3:48 PM

## 2022-08-18 DIAGNOSIS — R7309 Other abnormal glucose: Secondary | ICD-10-CM | POA: Diagnosis not present

## 2022-08-18 DIAGNOSIS — E1169 Type 2 diabetes mellitus with other specified complication: Secondary | ICD-10-CM | POA: Diagnosis not present

## 2022-08-18 DIAGNOSIS — I129 Hypertensive chronic kidney disease with stage 1 through stage 4 chronic kidney disease, or unspecified chronic kidney disease: Secondary | ICD-10-CM | POA: Diagnosis not present

## 2022-08-18 DIAGNOSIS — N183 Chronic kidney disease, stage 3 unspecified: Secondary | ICD-10-CM | POA: Diagnosis not present

## 2022-08-21 NOTE — Progress Notes (Signed)
Remote ICD transmission.   

## 2022-08-22 DIAGNOSIS — M898X9 Other specified disorders of bone, unspecified site: Secondary | ICD-10-CM | POA: Diagnosis not present

## 2022-08-22 DIAGNOSIS — I1310 Hypertensive heart and chronic kidney disease without heart failure, with stage 1 through stage 4 chronic kidney disease, or unspecified chronic kidney disease: Secondary | ICD-10-CM | POA: Diagnosis not present

## 2022-08-22 DIAGNOSIS — I129 Hypertensive chronic kidney disease with stage 1 through stage 4 chronic kidney disease, or unspecified chronic kidney disease: Secondary | ICD-10-CM | POA: Diagnosis not present

## 2022-08-22 DIAGNOSIS — N1832 Chronic kidney disease, stage 3b: Secondary | ICD-10-CM | POA: Diagnosis not present

## 2022-08-22 DIAGNOSIS — E559 Vitamin D deficiency, unspecified: Secondary | ICD-10-CM | POA: Diagnosis not present

## 2022-08-22 DIAGNOSIS — D631 Anemia in chronic kidney disease: Secondary | ICD-10-CM | POA: Diagnosis not present

## 2022-08-27 ENCOUNTER — Other Ambulatory Visit: Payer: Self-pay | Admitting: Internal Medicine

## 2022-09-01 DIAGNOSIS — E785 Hyperlipidemia, unspecified: Secondary | ICD-10-CM | POA: Diagnosis not present

## 2022-09-01 DIAGNOSIS — N1832 Chronic kidney disease, stage 3b: Secondary | ICD-10-CM | POA: Diagnosis not present

## 2022-09-01 DIAGNOSIS — I110 Hypertensive heart disease with heart failure: Secondary | ICD-10-CM | POA: Diagnosis not present

## 2022-09-10 ENCOUNTER — Other Ambulatory Visit: Payer: Self-pay | Admitting: Adult Health

## 2022-09-11 ENCOUNTER — Ambulatory Visit: Payer: Medicare Other | Attending: Internal Medicine

## 2022-09-11 DIAGNOSIS — I5022 Chronic systolic (congestive) heart failure: Secondary | ICD-10-CM | POA: Diagnosis not present

## 2022-09-11 DIAGNOSIS — Z95810 Presence of automatic (implantable) cardiac defibrillator: Secondary | ICD-10-CM | POA: Diagnosis not present

## 2022-09-13 NOTE — Progress Notes (Signed)
EPIC Encounter for ICM Monitoring  Patient Name: Jason Russell is a 72 y.o. male Date: 09/13/2022 Primary Care Physican: Renaye Rakers, MD Primary Cardiologist: Tenny Craw Electrophysiologist: Ladona Ridgel 06/02/2022 Weight: 280 lbs 07/03/2022 Weight: 280 lbs 09/13/2022 Weight: 280 lbs   Spoke with patient and heart failure questions reviewed.  Transmission results reviewed.  Pt asymptomatic for fluid accumulation.  Reports feeling well at this time and voices no complaints.      CorVue Thoracic impedance suggesting normal fluid levels with the exception of possible fluid accumulation from 5/31-6/8.   Prescribed:  Furosemide 40 mg take 0.5 tablet (20 mg total) by mouth every other day alternating with 1 tablet (40 mg total) every other day.  Potassium 20 mEq 1 tablet daily. Spironolactone 25 mg take 1 tablet daily   Labs: 02/17/2022 Creatinine 2.07, BUN 23, Potassium 4.4, Sodium 141, GFR 34 02/15/2022 Creatinine 1.88, BUN 28, Potassium 4.2, Sodium 141, GFR 38 A complete set of results can be found in results review   Recommendations:  No changes and encouraged to call if experiencing any fluid symptoms.   Follow-up plan: ICM clinic phone appointment on 10/17/2022   91 day device clinic remote transmission 10/16/2022.     EP/Cardiology Office Visits:  Recall 10/19/2022 with Dr Ladona Ridgel.   Copy of ICM check sent to Dr. Ladona Ridgel.   3 month ICM trend: 09/11/2022.    12-14 Month ICM trend:     Karie Soda, RN 09/13/2022 8:42 AM

## 2022-09-29 ENCOUNTER — Other Ambulatory Visit: Payer: Self-pay | Admitting: Internal Medicine

## 2022-09-30 ENCOUNTER — Other Ambulatory Visit: Payer: Self-pay | Admitting: Internal Medicine

## 2022-09-30 ENCOUNTER — Other Ambulatory Visit: Payer: Self-pay | Admitting: Adult Health

## 2022-09-30 DIAGNOSIS — I480 Paroxysmal atrial fibrillation: Secondary | ICD-10-CM

## 2022-10-01 DIAGNOSIS — E785 Hyperlipidemia, unspecified: Secondary | ICD-10-CM | POA: Diagnosis not present

## 2022-10-01 DIAGNOSIS — N1832 Chronic kidney disease, stage 3b: Secondary | ICD-10-CM | POA: Diagnosis not present

## 2022-10-01 DIAGNOSIS — I110 Hypertensive heart disease with heart failure: Secondary | ICD-10-CM | POA: Diagnosis not present

## 2022-10-02 NOTE — Telephone Encounter (Signed)
Prescription refill request for Xarelto received.  Indication:afib Last office visit:1/24 Weight:136  kg Age:72 Scr:1.88  11/23 CrCl:70.33  ml/min  Prescription refilled

## 2022-10-12 DIAGNOSIS — I4891 Unspecified atrial fibrillation: Secondary | ICD-10-CM | POA: Diagnosis not present

## 2022-10-12 DIAGNOSIS — I484 Atypical atrial flutter: Secondary | ICD-10-CM | POA: Diagnosis not present

## 2022-10-12 DIAGNOSIS — E1169 Type 2 diabetes mellitus with other specified complication: Secondary | ICD-10-CM | POA: Diagnosis not present

## 2022-10-12 DIAGNOSIS — E039 Hypothyroidism, unspecified: Secondary | ICD-10-CM | POA: Diagnosis not present

## 2022-10-12 DIAGNOSIS — I130 Hypertensive heart and chronic kidney disease with heart failure and stage 1 through stage 4 chronic kidney disease, or unspecified chronic kidney disease: Secondary | ICD-10-CM | POA: Diagnosis not present

## 2022-10-12 DIAGNOSIS — I110 Hypertensive heart disease with heart failure: Secondary | ICD-10-CM | POA: Diagnosis not present

## 2022-10-12 DIAGNOSIS — N1832 Chronic kidney disease, stage 3b: Secondary | ICD-10-CM | POA: Diagnosis not present

## 2022-10-16 ENCOUNTER — Ambulatory Visit: Payer: Medicare Other

## 2022-10-16 DIAGNOSIS — N1832 Chronic kidney disease, stage 3b: Secondary | ICD-10-CM | POA: Diagnosis not present

## 2022-10-16 DIAGNOSIS — E1169 Type 2 diabetes mellitus with other specified complication: Secondary | ICD-10-CM | POA: Diagnosis not present

## 2022-10-16 DIAGNOSIS — I5022 Chronic systolic (congestive) heart failure: Secondary | ICD-10-CM

## 2022-10-16 DIAGNOSIS — I4891 Unspecified atrial fibrillation: Secondary | ICD-10-CM | POA: Diagnosis not present

## 2022-10-16 DIAGNOSIS — I110 Hypertensive heart disease with heart failure: Secondary | ICD-10-CM | POA: Diagnosis not present

## 2022-10-16 DIAGNOSIS — E039 Hypothyroidism, unspecified: Secondary | ICD-10-CM | POA: Diagnosis not present

## 2022-10-16 DIAGNOSIS — I484 Atypical atrial flutter: Secondary | ICD-10-CM | POA: Diagnosis not present

## 2022-10-16 DIAGNOSIS — I429 Cardiomyopathy, unspecified: Secondary | ICD-10-CM

## 2022-10-17 ENCOUNTER — Ambulatory Visit: Payer: Medicare Other | Attending: Internal Medicine

## 2022-10-17 DIAGNOSIS — I5022 Chronic systolic (congestive) heart failure: Secondary | ICD-10-CM

## 2022-10-17 DIAGNOSIS — Z95810 Presence of automatic (implantable) cardiac defibrillator: Secondary | ICD-10-CM | POA: Diagnosis not present

## 2022-10-18 ENCOUNTER — Other Ambulatory Visit: Payer: Self-pay | Admitting: Internal Medicine

## 2022-10-19 ENCOUNTER — Other Ambulatory Visit: Payer: Self-pay | Admitting: Internal Medicine

## 2022-10-19 DIAGNOSIS — H40013 Open angle with borderline findings, low risk, bilateral: Secondary | ICD-10-CM | POA: Diagnosis not present

## 2022-10-19 DIAGNOSIS — Z961 Presence of intraocular lens: Secondary | ICD-10-CM | POA: Diagnosis not present

## 2022-10-19 DIAGNOSIS — H04123 Dry eye syndrome of bilateral lacrimal glands: Secondary | ICD-10-CM | POA: Diagnosis not present

## 2022-10-19 LAB — CUP PACEART REMOTE DEVICE CHECK
Battery Remaining Longevity: 62 mo
Battery Remaining Percentage: 58 %
Battery Voltage: 2.98 V
Brady Statistic RV Percent Paced: 1.4 %
Date Time Interrogation Session: 20240715020017
HighPow Impedance: 44 Ohm
HighPow Impedance: 44 Ohm
Implantable Lead Connection Status: 753985
Implantable Lead Implant Date: 20140703
Implantable Lead Location: 753860
Implantable Pulse Generator Implant Date: 20191113
Lead Channel Impedance Value: 430 Ohm
Lead Channel Pacing Threshold Amplitude: 0.5 V
Lead Channel Pacing Threshold Pulse Width: 0.5 ms
Lead Channel Sensing Intrinsic Amplitude: 3.9 mV
Lead Channel Setting Pacing Amplitude: 2.5 V
Lead Channel Setting Pacing Pulse Width: 0.5 ms
Lead Channel Setting Sensing Sensitivity: 0.5 mV
Pulse Gen Serial Number: 9820959
Zone Setting Status: 755011

## 2022-10-22 NOTE — Progress Notes (Signed)
EPIC Encounter for ICM Monitoring  Patient Name: Jason Russell is a 72 y.o. male Date: 10/22/2022 Primary Care Physican: Renaye Rakers, MD Primary Cardiologist: Tenny Craw Electrophysiologist: Ladona Ridgel 06/02/2022 Weight: 280 lbs 07/03/2022 Weight: 280 lbs 09/13/2022 Weight: 280 lbs   Transmission results reviewed.       CorVue Thoracic impedance suggesting normal fluid levels with the exception of possible fluid accumulation from 7/3-7/10.   Prescribed:  Furosemide 40 mg take 0.5 tablet (20 mg total) by mouth every other day alternating with 1 tablet (40 mg total) every other day.  Potassium 20 mEq 1 tablet daily. Spironolactone 25 mg take 1 tablet daily   Labs: 02/17/2022 Creatinine 2.07, BUN 23, Potassium 4.4, Sodium 141, GFR 34 02/15/2022 Creatinine 1.88, BUN 28, Potassium 4.2, Sodium 141, GFR 38 A complete set of results can be found in results review   Recommendations:  No changes.   Follow-up plan: ICM clinic phone appointment on 11/20/2022   91 day device clinic remote transmission 01/15/2023.     EP/Cardiology Office Visits:  11/23/2022 with Otilio Saber, PA.    Copy of ICM check sent to Dr. Ladona Ridgel.    Direct Trend through 10/22/2022   3 month ICM trend: 10/17/2022.    12-14 Month ICM trend:     Karie Soda, RN 10/22/2022 10:15 AM

## 2022-10-24 ENCOUNTER — Other Ambulatory Visit: Payer: Self-pay | Admitting: Internal Medicine

## 2022-10-26 ENCOUNTER — Other Ambulatory Visit: Payer: Self-pay | Admitting: Internal Medicine

## 2022-10-29 ENCOUNTER — Other Ambulatory Visit: Payer: Self-pay | Admitting: Internal Medicine

## 2022-10-30 NOTE — Progress Notes (Signed)
Remote ICD transmission.   

## 2022-11-01 ENCOUNTER — Other Ambulatory Visit: Payer: Self-pay | Admitting: Internal Medicine

## 2022-11-02 ENCOUNTER — Other Ambulatory Visit: Payer: Self-pay

## 2022-11-02 ENCOUNTER — Ambulatory Visit (INDEPENDENT_AMBULATORY_CARE_PROVIDER_SITE_OTHER): Payer: Medicare Other | Admitting: Adult Health

## 2022-11-02 ENCOUNTER — Encounter: Payer: Self-pay | Admitting: Adult Health

## 2022-11-02 VITALS — BP 132/74 | HR 61 | Temp 97.3°F | Ht 73.0 in | Wt 302.8 lb

## 2022-11-02 DIAGNOSIS — C3411 Malignant neoplasm of upper lobe, right bronchus or lung: Secondary | ICD-10-CM

## 2022-11-02 DIAGNOSIS — G4733 Obstructive sleep apnea (adult) (pediatric): Secondary | ICD-10-CM | POA: Diagnosis not present

## 2022-11-02 DIAGNOSIS — J430 Unilateral pulmonary emphysema [MacLeod's syndrome]: Secondary | ICD-10-CM

## 2022-11-02 MED ORDER — ENTRESTO 97-103 MG PO TABS: 1.0000 | ORAL_TABLET | Freq: Two times a day (BID) | ORAL | 0 refills | Status: DC

## 2022-11-02 NOTE — Patient Instructions (Addendum)
Continue on TRELEGY daily , rinse after use Activity as tolerated.  Albuterol inhaler as needed  Follow up with Oncology as planned  Continue with surveillance CT scans.  Continue on CPAP at bedtime Do not drive if sleepy Work on [employer] .    Follow up with Dr. Delton Coombes or Svetlana Bagby NP In 6 months Spirometry with DLCO and As needed

## 2022-11-02 NOTE — Assessment & Plan Note (Signed)
Stage IIIa lung cancer status post chemo, immunotherapy, radiation.  Most recent CT chest July 2023 showed no new or progressive changes.  Patient is overdue for oncology follow-up and serial CT.  Set up CT chest . Refer to Oncology

## 2022-11-02 NOTE — Progress Notes (Signed)
@Patient  ID: Jason Russell, male    DOB: 1950/09/08, 72 y.o.   MRN: [id number]  Chief Complaint  Patient presents with   Follow-up    Referring provider: Renaye Rakers, MD  HPI: 72 year old male followed for severe COPD, severe obstructive sleep apnea and a history of non-small cell lung cancer (stage IIIa non-small cell lung cancer, adenocarcinoma the right upper lobe in addition to mediastinal lymphadenopathy status post chemo, immunotherapy and XRT) Medical history significant for nonischemic heart disease, cardiomyopathy, congestive heart failure +, atrial fib status post ICD implant on amiodarone and Xarelto  TEST/EVENTS :  07/31/16: FVC 1.84 L (41%) FEV1 1.14 L (42%) FEV1/FVC 0.77 FEF 25-75 1.17 L (38%) positive bronchodilator response TLC 5.76 L (75%) RV 140% ERV 48% DLCO uncorrected 52%     IMAGING PET CT 07/31/16 Markedly hypermetabolic posterior right upper lobe pulmonary lesion consistent with malignancy and hypermetabolic mediastinal lymph node metastases.   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 This obscures the regional right upper lobe apical lung nodule. No obvious nodular growth or recurrent thoracic adenopathy.   PATHOLOGY FNA RUL 09/06/16:  Adenocarcinoma  EBUS 4R & 7 08/09/16:  Adenocarcinoma    CT chest October 18, 2020 shows stable posttreatment changes of radiation fibrosis in the right hemothorax with no evidence to suggest local recurrence of disease or definite metastatic disease in the thorax.  Very mild emphysema.   CT chest October 25, 2021 showed no new or progressive findings in that chest with evidence of posttreatment change in the right hilum and right paramediastinal lung.  11/02/2022 Follow up ; COPD, sleep apnea, lung cancer history Patient returns for a follow-up visit.  Patient was seen last in October 2023.  He has underlying sleep apnea.  He remains on CPAP at bedtime.  Patient says he is doing well on CPAP.   Wears it every still night.  Feels that he is rested with decreased daytime sleepiness.  Feels that he benefits from CPAP.  CPAP download shows 100% compliance.  Uses a fullface mask.  Daily average usage at 8.5 hours.  Patient is on auto CPAP 5 to 20 cm H2O.  AHI 2.5/hour.  Patient has a history of stage IIIa non-small cell lung cancer followed by oncology.  Last CT chest October 25, 2021 showed no new or progressive findings.  Patient has not been back to oncology or had his serial CT.  We discussed need for follow-up.   Patient has underlying severe COPD and is maintained on Trelegy inhaler.  Says overall breathing is doing okay.  He denies any increased shortness of breath.  No increased cough.  Says he remains active.    No Known Allergies  Immunization History  Administered Date(s) Administered   Fluad Quad(high Dose 65+) 12/06/2021   Influenza Split 10/27/2016   Influenza, High Dose Seasonal PF 11/14/2016, 11/24/2018   Influenza,inj,Quad PF,6+ Mos 12/08/2015   Influenza,inj,quad, With Preservative 12/03/2019   Influenza-Unspecified 11/02/2014, 12/08/2015, 10/27/2016   PFIZER Comirnaty(Gray Top)Covid-19 Tri-Sucrose Vaccine 05/10/2019, 06/02/2019, 01/03/2020   PFIZER(Purple Top)SARS-COV-2 Vaccination 05/10/2019, 06/02/2019   PNEUMOCOCCAL CONJUGATE-20 12/25/2021   Pneumococcal Conjugate-13 11/14/2016   Pneumococcal Polysaccharide-23 10/16/2013   Pneumococcal-Unspecified 10/27/2016    Past Medical History:  Diagnosis Date   Adenocarcinoma of right lung, stage 3 (HCC) 08/23/2016   Anemia    Arthritis    "hx right hip"   Asthma    "when I was a child"   Atrial fibrillation (HCC)    Amiodarone started 10/2011; Coumadin  Automatic implantable cardioverter-defibrillator in situ 10/03/2012   a. St. Jude ICD implantation 10/03/12.   Chronic anticoagulation    Chronic fatigue 10/18/2016   Chronic systolic heart failure (HCC)    a. Echo 7/13: EF 25%;  b. echo 04/2012:  Mild LVH, EF 30-35%,  Gr 1 DD, mild AI, mild MR, mild LAE   COPD (chronic obstructive pulmonary disease) (HCC)    Dyslipidemia    Gout    History of blood transfusion 10/15/2013   "don't know where the blood's going; HgB down to 5"   Hyperlipidemia    Hypertension    Hypothyroidism    NICM (nonischemic cardiomyopathy) (HCC)    LHC 4/14:  minimal CAD   Obesity    OSA on CPAP    Tobacco abuse     Tobacco History: Social History   Tobacco Use  Smoking Status Former   Current packs/day: 0.00   Average packs/day: 0.3 packs/day for 45.0 years (11.3 ttl pk-yrs)   Types: Cigarettes   Start date: 07/12/1971   Quit date: 07/11/2016   Years since quitting: 6.3  Smokeless Tobacco Never   Counseling given: Not Answered   Outpatient Medications Prior to Visit  Medication Sig Dispense Refill   albuterol (VENTOLIN HFA) 108 (90 Base) MCG/ACT inhaler INHALE 2 PUFFS INTO THE LUNGS EVERY 4 HOURS AS NEEDED FOR WHEEZING OR SHORTNESS OF BREATH. 8.5 g 5   amiodarone (PACERONE) 200 MG tablet TAKE 0.5 TABLET (100MG ) BY MOUTH DAILY MONDAY THROUGH FRIDAY (NOT ON SATURDAY/SUNDAY) 30 tablet 4   atorvastatin (LIPITOR) 40 MG tablet TAKE 1 TABLET BY MOUTH EVERY DAY 30 tablet 0   bisoprolol (ZEBETA) 5 MG tablet TAKE 1 TABLET (5 MG TOTAL) BY MOUTH DAILY. 90 tablet 1   [employer] D3 2000 units CAPS Take 2,000 Units by mouth daily.   11   ferrous gluconate (FERGON) 324 MG tablet TAKE 1 TABLET BY MOUTH THREE TIMES A DAY WITH MEALS 270 tablet 0   Fluticasone-Umeclidin-Vilant (TRELEGY ELLIPTA) 100-62.5-25 MCG/ACT AEPB INHALE 1 PUFF BY MOUTH EVERY DAY 180 each 3   furosemide (LASIX) 40 MG tablet Take 1 tablet (40 mg) every other day, alternating with 1/2 tablet (20 mg) every other day. 90 tablet 3   levothyroxine (SYNTHROID) 50 MCG tablet TAKE 1 TABLET BY MOUTH EVERY DAY BEFORE BREAKFAST 90 tablet 3   magnesium oxide (MAG-OX) 400 (241.3 Mg) MG tablet Take 1 tablet (400 mg total) by mouth daily. 30 tablet 0   Polyvinyl Alcohol-Povidone (REFRESH  OP) Place 1 drop into both eyes every morning.     Potassium Chloride ER 20 MEQ TBCR TAKE 1 TABLET BY MOUTH EVERY DAY 15 tablet 0   sacubitril-valsartan (ENTRESTO) 97-103 MG Take 1 tablet by mouth 2 (two) times daily. 30 tablet 0   sildenafil (VIAGRA) 100 MG tablet TAKE 1 TABLET BY MOUTH EVERY DAY AS NEEDED FOR ERECTILE DYSFUNCTION 30 tablet 1   spironolactone (ALDACTONE) 25 MG tablet TAKE 1 TABLET (25 MG TOTAL) BY MOUTH DAILY. 90 tablet 1   ULORIC 40 MG tablet Take 1 tablet (40 mg total) by mouth daily. 30 tablet 0   XARELTO 20 MG TABS tablet TAKE 1 TABLET BY MOUTH EVERY DAY 90 tablet 1   No facility-administered medications prior to visit.     Review of Systems:   Constitutional:   No  weight loss, night sweats,  Fevers, chills,  +fatigue, or  lassitude.  HEENT:   No headaches,  Difficulty swallowing,  Tooth/dental problems, or  Sore  throat,                No sneezing, itching, ear ache, nasal congestion, post nasal drip,   CV:  No chest pain,  Orthopnea, PND, swelling in lower extremities, anasarca, dizziness, palpitations, syncope.   GI  No heartburn, indigestion, abdominal pain, nausea, vomiting, diarrhea, change in bowel habits, loss of appetite, bloody stools.   Resp: No shortness of breath with exertion or at rest.  No excess mucus, no productive cough,  No non-productive cough,  No coughing up of blood.  No change in color of mucus.  No wheezing.  No chest wall deformity  Skin: no rash or lesions.  GU: no dysuria, change in color of urine, no urgency or frequency.  No flank pain, no hematuria   MS:  No joint pain or swelling.  No decreased range of motion.  No back pain.    Physical Exam  BP 132/74 (BP Location: Left Arm, Cuff Size: Large)   Pulse 61   Temp (!) 97.3 F (36.3 C) (Oral)   Ht 6\' 1"  (1.854 m)   Wt (!) 302 lb 12.8 oz (137.3 kg)   BMI 39.95 kg/m   GEN: A/Ox3; pleasant , NAD, well nourished    HEENT:  [place]/AT,  EACs-clear, TMs-wnl, NOSE-clear,  THROAT-clear, no lesions, no postnasal drip or exudate noted. Class 3-4 MP airway   NECK:  Supple w/ fair ROM; no JVD; normal carotid impulses w/o bruits; no thyromegaly or nodules palpated; no lymphadenopathy.    RESP  Clear  P & A; w/o, wheezes/ rales/ or rhonchi. no accessory muscle use, no dullness to percussion  CARD:  RRR, no m/r/g, 1+ peripheral edema, pulses intact, no cyanosis or clubbing.  GI:   Soft & nt; nml bowel sounds; no organomegaly or masses detected.   Musco: Warm bil, no deformities or joint swelling noted.   Neuro: alert, no focal deficits noted.    Skin: Warm, no lesions or rashes      Administration History     None          Latest Ref Rng & Units 07/31/2016    9:47 AM  PFT Results  FVC-Pre L 1.84   FVC-Predicted Pre % 41   FVC-Post L 2.32   FVC-Predicted Post % 52   Pre FEV1/FVC % % 77   Post FEV1/FCV % % 73   FEV1-Pre L 1.42   FEV1-Predicted Pre % 42   FEV1-Post L 1.69   DLCO uncorrected ml/min/mmHg 19.27   DLCO UNC% % 52   DLVA Predicted % 109   TLC L 5.76   TLC % Predicted % 75   RV % Predicted % 140     No results found for: "NITRICOXIDE"      Assessment & Plan:   COPD (chronic obstructive pulmonary disease) (HCC) Doing well on current regimen.  Can stay on Trelegy daily.  Vaccines are up-to-date.  Plan  Patient Instructions  Continue on TRELEGY daily , rinse after use Activity as tolerated.  Albuterol inhaler as needed  Follow up with Oncology as planned  Continue with surveillance CT scans.  Continue on CPAP at bedtime Do not drive if sleepy Work on [employer] .    Follow up with Dr. Delton Coombes or Torii Royse NP In 6 months Spirometry with DLCO and As needed      Obstructive sleep apnea (adult) (pediatric) Excellent control and compliance on nocturnal CPAP.  Plan  Patient Instructions  Continue on TRELEGY daily ,  rinse after use Activity as tolerated.  Albuterol inhaler as needed  Follow up with Oncology as  planned  Continue with surveillance CT scans.  Continue on CPAP at bedtime Do not drive if sleepy Work on [employer] .    Follow up with Dr. Delton Coombes or Zareena Willis NP In 6 months Spirometry with DLCO and As needed      Primary cancer of right upper lobe of lung (HCC) Stage IIIa lung cancer status post chemo, immunotherapy, radiation.  Most recent CT chest July 2023 showed no new or progressive changes.  Patient is overdue for oncology follow-up and serial CT.  Set up CT chest . Refer to Oncology      Rubye Oaks, NP 11/02/2022

## 2022-11-02 NOTE — Assessment & Plan Note (Signed)
Excellent control and compliance on nocturnal CPAP.  Plan  Patient Instructions  Continue on TRELEGY daily , rinse after use Activity as tolerated.  Albuterol inhaler as needed  Follow up with Oncology as planned  Continue with surveillance CT scans.  Continue on CPAP at bedtime Do not drive if sleepy Work on [employer] .    Follow up with Dr. Delton Coombes or Cyann Venti NP In 6 months Spirometry with DLCO and As needed

## 2022-11-02 NOTE — Assessment & Plan Note (Signed)
Doing well on current regimen.  Can stay on Trelegy daily.  Vaccines are up-to-date.  Plan  Patient Instructions  Continue on TRELEGY daily , rinse after use Activity as tolerated.  Albuterol inhaler as needed  Follow up with Oncology as planned  Continue with surveillance CT scans.  Continue on CPAP at bedtime Do not drive if sleepy Work on [employer] .    Follow up with Dr. Delton Coombes or Calib Wadhwa NP In 6 months Spirometry with DLCO and As needed

## 2022-11-02 NOTE — Addendum Note (Signed)
Addended by: Lanna Poche on: 11/02/2022 04:35 PM   Modules accepted: Orders

## 2022-11-02 NOTE — Telephone Encounter (Signed)
Pt's medication was sent to pt's pharmacy as requested. Confirmation received.  °

## 2022-11-03 ENCOUNTER — Telehealth: Payer: Self-pay | Admitting: Internal Medicine

## 2022-11-03 NOTE — Telephone Encounter (Signed)
Patient requested a follow up appointment; patient is aware of appointment times/dates

## 2022-11-08 ENCOUNTER — Encounter: Payer: Medicare Other | Admitting: Student

## 2022-11-15 ENCOUNTER — Ambulatory Visit (HOSPITAL_COMMUNITY)
Admission: RE | Admit: 2022-11-15 | Discharge: 2022-11-15 | Disposition: A | Discharge status: Home / Self Care | Payer: Medicare Other | Source: Ambulatory Visit | Attending: Family Medicine | Admitting: Family Medicine

## 2022-11-15 DIAGNOSIS — J430 Unilateral pulmonary emphysema [MacLeod's syndrome]: Secondary | ICD-10-CM | POA: Diagnosis not present

## 2022-11-15 DIAGNOSIS — C3411 Malignant neoplasm of upper lobe, right bronchus or lung: Secondary | ICD-10-CM | POA: Diagnosis not present

## 2022-11-15 DIAGNOSIS — I7121 Aneurysm of the ascending aorta, without rupture: Secondary | ICD-10-CM | POA: Diagnosis not present

## 2022-11-15 DIAGNOSIS — G4733 Obstructive sleep apnea (adult) (pediatric): Secondary | ICD-10-CM | POA: Diagnosis not present

## 2022-11-15 DIAGNOSIS — C3490 Malignant neoplasm of unspecified part of unspecified bronchus or lung: Secondary | ICD-10-CM | POA: Diagnosis not present

## 2022-11-15 DIAGNOSIS — J90 Pleural effusion, not elsewhere classified: Secondary | ICD-10-CM | POA: Diagnosis not present

## 2022-11-16 DIAGNOSIS — M542 Cervicalgia: Secondary | ICD-10-CM | POA: Diagnosis not present

## 2022-11-16 DIAGNOSIS — M25511 Pain in right shoulder: Secondary | ICD-10-CM | POA: Diagnosis not present

## 2022-11-17 ENCOUNTER — Other Ambulatory Visit: Payer: Self-pay

## 2022-11-17 MED ORDER — ENTRESTO 97-103 MG PO TABS: 1.0000 | ORAL_TABLET | Freq: Two times a day (BID) | ORAL | 0 refills | Status: DC

## 2022-11-17 NOTE — Telephone Encounter (Signed)
Pt's medication was sent to pt's pharmacy as requested. Confirmation received.  °

## 2022-11-19 NOTE — Progress Notes (Unsigned)
Cardiology Office Note   Date:  11/20/2022   ID:  Bash Eisenberg, DOB 1950-08-27, MRN [id number]  PCP:  Renaye Rakers, MD  Cardiologist:   Dietrich Pates, MD   F/U of systolic CHF and atrial fibrillation     History of Present Illness:   Jason Russell is a 72 y.o. male with hx of nonobstructive CAD (cath 2014), HTN, OSA,lung CA (s/p XRT and chemo), PAF (on amiodarone), HFrEF Last echo in Feb 2020 LVEF 35 to 40%     Pt is s/p ICD implant in 2014 for primary prevention.  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  He says his breathing is good  He denies  CP     No outpatient medications have been marked as taking for the 11/20/22 encounter (Office Visit) with Pricilla Riffle, MD.     Allergies:   Patient has no known allergies.   Past Medical History:  Diagnosis Date   Adenocarcinoma of right lung, stage 3 (HCC) 08/23/2016   Anemia    Arthritis    "hx right hip"   Asthma    "when I was a child"   Atrial fibrillation (HCC)    Amiodarone started 10/2011; Coumadin   Automatic implantable cardioverter-defibrillator in situ 10/03/2012   a. St. Jude ICD implantation 10/03/12.   Chronic anticoagulation    Chronic fatigue 10/18/2016   Chronic systolic heart failure (HCC)    a. Echo 7/13: EF 25%;  b. echo 04/2012:  Mild LVH, EF 30-35%, Gr 1 DD, mild AI, mild MR, mild LAE   COPD (chronic obstructive pulmonary disease) (HCC)    Dyslipidemia    Gout    History of blood transfusion 10/15/2013   "don't know where the blood's going; HgB down to 5"   Hyperlipidemia    Hypertension    Hypothyroidism    NICM (nonischemic cardiomyopathy) (HCC)    LHC 4/14:  minimal CAD   Obesity    OSA on CPAP    Tobacco abuse     Past Surgical  History:  Procedure Laterality Date   CARDIAC DEFIBRILLATOR PLACEMENT  2014   CARDIOVERSION  2011   CARDIOVERSION N/A 03/15/2020   Procedure: CARDIOVERSION;  Surgeon: Pricilla Riffle, MD;  Location: [facility] ENDOSCOPY;  Service: Cardiovascular;  Laterality: N/A;   COLONOSCOPY WITH PROPOFOL Left 10/17/2013   Procedure: COLONOSCOPY WITH PROPOFOL;  Surgeon: Louis Meckel, MD;  Location: [facility] ENDOSCOPY;  Service: Endoscopy;  Laterality: Left;   ESOPHAGOGASTRODUODENOSCOPY N/A 10/17/2013   Procedure: ESOPHAGOGASTRODUODENOSCOPY (EGD);  Surgeon: Louis Meckel, MD;  Location: [facility] ENDOSCOPY;  Service: Endoscopy;  Laterality: N/A;   ESOPHAGOGASTRODUODENOSCOPY (EGD) WITH PROPOFOL N/A 06/14/2018   Procedure: ESOPHAGOGASTRODUODENOSCOPY (EGD) WITH PROPOFOL;  Surgeon: Sherrilyn Rist, MD;  Location: [facility] ENDOSCOPY;  Service: Gastroenterology;  Laterality: N/A;   GIVENS CAPSULE STUDY N/A 10/29/2013   Procedure: GIVENS CAPSULE STUDY;  Surgeon: Louis Meckel, MD;  Location: [facility] ENDOSCOPY;  Service: Endoscopy;  Laterality: N/A;   ICD GENERATOR CHANGEOUT N/A 02/13/2018   Procedure: ICD GENERATOR CHANGEOUT;  Surgeon: Regan Lemming, MD;  Location: [facility]  INVASIVE CV LAB;  Service: Cardiovascular;  Laterality: N/A;   IMPLANTABLE CARDIOVERTER DEFIBRILLATOR IMPLANT Left 10/03/2012   Procedure: IMPLANTABLE CARDIOVERTER DEFIBRILLATOR IMPLANT;  Surgeon: Duke Salvia, MD;  Location: [facility] CATH LAB;  Service: Cardiovascular;  Laterality: Left;   IR FLUORO GUIDE PORT INSERTION RIGHT  09/21/2016   IR US GUIDE VASC ACCESS RIGHT  09/21/2016   JOINT REPLACEMENT     REVERSE SHOULDER ARTHROPLASTY Right 09/22/2020   Procedure: SHOULDER HEMI ARTHROPLASTY;  Surgeon: Bjorn Pippin, MD;  Location: [facility] ORS;  Service: Orthopedics;  Laterality: Right;   SAVORY DILATION N/A 06/14/2018   Procedure: SAVORY DILATION;  Surgeon: Sherrilyn Rist, MD;  Location: [facility] ENDOSCOPY;  Service: Gastroenterology;  Laterality: N/A;   TONSILLECTOMY  1950's   TOTAL  HIP ARTHROPLASTY Right 11/25/1997   V TACH ABLATION N/A 09/15/2021   Procedure: V TACH ABLATION;  Surgeon: Marinus Maw, MD;  Location: [facility] INVASIVE CV LAB;  Service: Cardiovascular;  Laterality: N/A;   VIDEO BRONCHOSCOPY WITH ENDOBRONCHIAL ULTRASOUND N/A 08/09/2016   Procedure: VIDEO BRONCHOSCOPY WITH ENDOBRONCHIAL ULTRASOUND;  Surgeon: Roslynn Amble, MD;  Location: [facility] OR;  Service: Thoracic;  Laterality: N/A;     Social History:  The patient  reports that he quit smoking about 6 years ago. His smoking use included cigarettes. He started smoking about 51 years ago. He has a 11.3 pack-year smoking history. He has never used smokeless tobacco. He reports current alcohol use of about 1.0 standard drink of alcohol per week. He reports that he does not use drugs.   Family History:  The patient's family history includes Heart Problems in his mother; Hypertension in his mother; Lung cancer in his brother; Other in his father.    ROS:  Please see the history of present illness. All other systems are reviewed and  Negative to the above problem except as noted.    PHYSICAL EXAM: VS:  BP 120/78   Pulse 62   Ht 6\' 1"  (1.854 m)   Wt (!) 309 lb 3.2 oz (140.3 kg)   SpO2 96%   BMI 40.79 kg/m   GEN: Morbidly obese 72 yo  in no acute distress  HEENT: normal  Neck: neck is full Difficult to assess JVP Cardiac: RRR; no murmurs,  1+LE edema Legs soft Respiratory:  clear to auscultation No rales GI: soft, nontender   No RUQ tenderness   EKG:  EKG :  SR   RBBB   Frequnt PVCs (L bundle morphology)  ECHO:   05/24/18   1. The left ventricle has moderately reduced systolic function, with an  ejection fraction of 35-40%. The cavity size was normal. There is mildly  increased left ventricular wall thickness. Left ventricular diastolic  Doppler parameters are consistent with   impaired relaxation.   2. The right ventricle has mildly reduced systolic function. The cavity  was small. There is no increase  in right ventricular wall thickness.   3. The mitral valve is normal in structure. Mild thickening of the mitral  valve leaflet.   4. The tricuspid valve is normal in structure.   5. The aortic valve is normal in structure. Aortic valve regurgitation is  mild to moderate by color flow Doppler.   6. The interatrial septum was not assessed.   Lipid Panel    Component Value Date/Time   CHOL 117 01/28/2020 0927   TRIG 71 01/28/2020 0927   HDL 31 (L) 01/28/2020 0927   CHOLHDL 3.8 01/28/2020 0927   CHOLHDL  3 12/23/2012 1205   VLDL 14.6 12/23/2012 1205   LDLCALC 71 01/28/2020 0927      Wt Readings from Last 3 Encounters:  11/20/22 (!) 309 lb 3.2 oz (140.3 kg)  11/20/22 (!) 305 lb (138.3 kg)  11/02/22 (!) 302 lb 12.8 oz (137.3 kg)      ASSESSMENT AND PLAN:  1  Chronic systolic CHF   LVEF 35 to 40%   Volume status appears OK but assessment diffiuclt due to body habitus    Will check labs  Continue Bisoprolol,Entresto ICD in place   2 HTN  BP is fair  Follow    3  Hx CAD  Nonobstructive at cath   Follow   4  VT  Pt with PMVT on study  Continues on amiodarone Has ICD EKG with frequent PVCs today  Will review with EP  5  Atrial fibrillation  Keep on amiodarone and Xarelto         Current medicines are reviewed at length with the patient today.  The patient does not have concerns regarding medicines.  Signed, Dietrich Pates, MD  11/20/2022 4:37 PM    [facility] [address] [place], [place], [place]  [address] Phone: [phone]; Fax: [phone]

## 2022-11-20 ENCOUNTER — Encounter: Payer: Self-pay | Admitting: Internal Medicine

## 2022-11-20 ENCOUNTER — Ambulatory Visit (INDEPENDENT_AMBULATORY_CARE_PROVIDER_SITE_OTHER): Payer: Medicare Other

## 2022-11-20 ENCOUNTER — Ambulatory Visit (INDEPENDENT_AMBULATORY_CARE_PROVIDER_SITE_OTHER): Payer: Medicare Other | Admitting: Internal Medicine

## 2022-11-20 ENCOUNTER — Inpatient Hospital Stay: Payer: Medicare Other | Attending: Internal Medicine | Admitting: Internal Medicine

## 2022-11-20 VITALS — BP 120/78 | HR 62 | Ht 73.0 in | Wt 309.2 lb

## 2022-11-20 VITALS — BP 153/63 | HR 65 | Temp 97.6°F | Resp 17 | Ht 73.0 in | Wt 305.0 lb

## 2022-11-20 DIAGNOSIS — I5022 Chronic systolic (congestive) heart failure: Secondary | ICD-10-CM | POA: Diagnosis not present

## 2022-11-20 DIAGNOSIS — Z95810 Presence of automatic (implantable) cardiac defibrillator: Secondary | ICD-10-CM

## 2022-11-20 DIAGNOSIS — C3411 Malignant neoplasm of upper lobe, right bronchus or lung: Secondary | ICD-10-CM | POA: Insufficient documentation

## 2022-11-20 DIAGNOSIS — R1310 Dysphagia, unspecified: Secondary | ICD-10-CM | POA: Insufficient documentation

## 2022-11-20 DIAGNOSIS — N289 Disorder of kidney and ureter, unspecified: Secondary | ICD-10-CM | POA: Insufficient documentation

## 2022-11-20 DIAGNOSIS — C3490 Malignant neoplasm of unspecified part of unspecified bronchus or lung: Secondary | ICD-10-CM

## 2022-11-20 DIAGNOSIS — Z9221 Personal history of antineoplastic chemotherapy: Secondary | ICD-10-CM | POA: Insufficient documentation

## 2022-11-20 NOTE — Progress Notes (Signed)
[facility] [phone]   Fax:([phone]  OFFICE PROGRESS NOTE  Renaye Rakers, MD [address] [place] [address]  DIAGNOSIS: Stage IIIA (T1a, N2, M0) non-small cell lung cancer, adenocarcinoma presented with right upper lobe lung nodule in addition to mediastinal lymphadenopathy diagnosed in March 2018.  Biomarker Findings Tumor Mutational Burden - TMB-Intermediate (8 Muts/Mb) Microsatellite Status - MS-Stable Genomic Findings For a complete list of the genes assayed, please refer to the Appendix. ERBB2 amplification - equivocal? DNMT3A K418fs*192 FUBP1 Q40* KEAP1 G335fs*68 TP53 C275F 7 Disease relevant genes with no reportable alterations: EGFR, KRAS, ALK, BRAF, MET, RET, ROS1   PRIOR THERAPY:  1) Course of concurrent chemoradiation with weekly carboplatin for AUC of 2 and paclitaxel 45 MG/M2. Status post 6 cycles. Last cycle was given 10/16/2016. 2)  Consolidation treatment with immunotherapy with Imfinzi (Durvalumab) 10 MG/M2 every 2 weeks. First dose 12/21/2016.  Status post 26 cycles.  CURRENT THERAPY: Observation.  INTERVAL HISTORY: Jason Russell 72 y.o. male returns to the clinic today for 6 months follow-up visit.  The patient is feeling fine today with no concerning complaints.  He denied having any current chest pain, shortness of breath, cough or hemoptysis.  He has no nausea, vomiting, diarrhea or constipation.  He has no headache or visual changes.  He denied having any fever or chills.  He has no significant weight loss or night sweats.  He is here today for evaluation with repeat CT scan of the chest for restaging of his disease.  MEDICAL HISTORY: Past Medical History:  Diagnosis Date   Adenocarcinoma of right lung, stage 3 (HCC) 08/23/2016   Anemia    Arthritis    "hx right hip"   Asthma    "when I was a child"   Atrial fibrillation (HCC)    Amiodarone started 10/2011; Coumadin   Automatic implantable  cardioverter-defibrillator in situ 10/03/2012   a. St. Jude ICD implantation 10/03/12.   Chronic anticoagulation    Chronic fatigue 10/18/2016   Chronic systolic heart failure (HCC)    a. Echo 7/13: EF 25%;  b. echo 04/2012:  Mild LVH, EF 30-35%, Gr 1 DD, mild AI, mild MR, mild LAE   COPD (chronic obstructive pulmonary disease) (HCC)    Dyslipidemia    Gout    History of blood transfusion 10/15/2013   "don't know where the blood's going; HgB down to 5"   Hyperlipidemia    Hypertension    Hypothyroidism    NICM (nonischemic cardiomyopathy) (HCC)    LHC 4/14:  minimal CAD   Obesity    OSA on CPAP    Tobacco abuse     ALLERGIES:  has No Known Allergies.  MEDICATIONS:  Current Outpatient Medications  Medication Sig Dispense Refill   albuterol (VENTOLIN HFA) 108 (90 Base) MCG/ACT inhaler INHALE 2 PUFFS INTO THE LUNGS EVERY 4 HOURS AS NEEDED FOR WHEEZING OR SHORTNESS OF BREATH. 8.5 g 5   amiodarone (PACERONE) 200 MG tablet TAKE 0.5 TABLET (100MG ) BY MOUTH DAILY MONDAY THROUGH FRIDAY (NOT ON SATURDAY/SUNDAY) 30 tablet 4   atorvastatin (LIPITOR) 40 MG tablet TAKE 1 TABLET BY MOUTH EVERY DAY 30 tablet 0   bisoprolol (ZEBETA) 5 MG tablet TAKE 1 TABLET (5 MG TOTAL) BY MOUTH DAILY. 90 tablet 1   [employer] D3 2000 units CAPS Take 2,000 Units by mouth daily.   11   ferrous gluconate (FERGON) 324 MG tablet TAKE 1 TABLET BY MOUTH THREE TIMES  A DAY WITH MEALS 270 tablet 0   Fluticasone-Umeclidin-Vilant (TRELEGY ELLIPTA) 100-62.5-25 MCG/ACT AEPB INHALE 1 PUFF BY MOUTH EVERY DAY 180 each 3   furosemide (LASIX) 40 MG tablet Take 1 tablet (40 mg) every other day, alternating with 1/2 tablet (20 mg) every other day. 90 tablet 3   levothyroxine (SYNTHROID) 50 MCG tablet TAKE 1 TABLET BY MOUTH EVERY DAY BEFORE BREAKFAST 90 tablet 3   magnesium oxide (MAG-OX) 400 (241.3 Mg) MG tablet Take 1 tablet (400 mg total) by mouth daily. 30 tablet 0   Polyvinyl Alcohol-Povidone (REFRESH OP) Place 1 drop into both eyes every  morning.     Potassium Chloride ER 20 MEQ TBCR TAKE 1 TABLET BY MOUTH EVERY DAY 15 tablet 0   sacubitril-valsartan (ENTRESTO) 97-103 MG Take 1 tablet by mouth 2 (two) times daily. 60 tablet 0   sildenafil (VIAGRA) 100 MG tablet TAKE 1 TABLET BY MOUTH EVERY DAY AS NEEDED FOR ERECTILE DYSFUNCTION 30 tablet 1   spironolactone (ALDACTONE) 25 MG tablet TAKE 1 TABLET (25 MG TOTAL) BY MOUTH DAILY. 90 tablet 1   ULORIC 40 MG tablet Take 1 tablet (40 mg total) by mouth daily. 30 tablet 0   XARELTO 20 MG TABS tablet TAKE 1 TABLET BY MOUTH EVERY DAY 90 tablet 1   No current facility-administered medications for this visit.    SURGICAL HISTORY:  Past Surgical History:  Procedure Laterality Date   CARDIAC DEFIBRILLATOR PLACEMENT  2014   CARDIOVERSION  2011   CARDIOVERSION N/A 03/15/2020   Procedure: CARDIOVERSION;  Surgeon: Pricilla Riffle, MD;  Location: [facility] ENDOSCOPY;  Service: Cardiovascular;  Laterality: N/A;   COLONOSCOPY WITH PROPOFOL Left 10/17/2013   Procedure: COLONOSCOPY WITH PROPOFOL;  Surgeon: Louis Meckel, MD;  Location: [facility] ENDOSCOPY;  Service: Endoscopy;  Laterality: Left;   ESOPHAGOGASTRODUODENOSCOPY N/A 10/17/2013   Procedure: ESOPHAGOGASTRODUODENOSCOPY (EGD);  Surgeon: Louis Meckel, MD;  Location: [facility] ENDOSCOPY;  Service: Endoscopy;  Laterality: N/A;   ESOPHAGOGASTRODUODENOSCOPY (EGD) WITH PROPOFOL N/A 06/14/2018   Procedure: ESOPHAGOGASTRODUODENOSCOPY (EGD) WITH PROPOFOL;  Surgeon: Sherrilyn Rist, MD;  Location: [facility] ENDOSCOPY;  Service: Gastroenterology;  Laterality: N/A;   GIVENS CAPSULE STUDY N/A 10/29/2013   Procedure: GIVENS CAPSULE STUDY;  Surgeon: Louis Meckel, MD;  Location: [facility] ENDOSCOPY;  Service: Endoscopy;  Laterality: N/A;   ICD GENERATOR CHANGEOUT N/A 02/13/2018   Procedure: ICD GENERATOR CHANGEOUT;  Surgeon: Regan Lemming, MD;  Location: [facility] INVASIVE CV LAB;  Service: Cardiovascular;  Laterality: N/A;   IMPLANTABLE CARDIOVERTER DEFIBRILLATOR IMPLANT Left  10/03/2012   Procedure: IMPLANTABLE CARDIOVERTER DEFIBRILLATOR IMPLANT;  Surgeon: Duke Salvia, MD;  Location: [facility] CATH LAB;  Service: Cardiovascular;  Laterality: Left;   IR FLUORO GUIDE PORT INSERTION RIGHT  09/21/2016   IR US GUIDE VASC ACCESS RIGHT  09/21/2016   JOINT REPLACEMENT     REVERSE SHOULDER ARTHROPLASTY Right 09/22/2020   Procedure: SHOULDER HEMI ARTHROPLASTY;  Surgeon: Bjorn Pippin, MD;  Location: [facility] ORS;  Service: Orthopedics;  Laterality: Right;   SAVORY DILATION N/A 06/14/2018   Procedure: SAVORY DILATION;  Surgeon: Sherrilyn Rist, MD;  Location: [facility] ENDOSCOPY;  Service: Gastroenterology;  Laterality: N/A;   TONSILLECTOMY  1950's   TOTAL HIP ARTHROPLASTY Right 11/25/1997   V TACH ABLATION N/A 09/15/2021   Procedure: V TACH ABLATION;  Surgeon: Marinus Maw, MD;  Location: [facility] INVASIVE CV LAB;  Service: Cardiovascular;  Laterality: N/A;   VIDEO BRONCHOSCOPY WITH ENDOBRONCHIAL ULTRASOUND N/A 08/09/2016   Procedure: VIDEO  BRONCHOSCOPY WITH ENDOBRONCHIAL ULTRASOUND;  Surgeon: Roslynn Amble, MD;  Location: [facility] OR;  Service: Thoracic;  Laterality: N/A;    REVIEW OF SYSTEMS:  A comprehensive review of systems was negative.   PHYSICAL EXAMINATION: General appearance: alert, cooperative, and no distress Head: Normocephalic, without obvious abnormality, atraumatic Neck: no adenopathy, no JVD, supple, symmetrical, trachea midline, and thyroid not enlarged, symmetric, no tenderness/mass/nodules Lymph nodes: Cervical, supraclavicular, and axillary nodes normal. Resp: clear to auscultation bilaterally Back: symmetric, no curvature. ROM normal. No CVA tenderness. Cardio: regular rate and rhythm, S1, S2 normal, no murmur, click, rub or gallop GI: soft, non-tender; bowel sounds normal; no masses,  no organomegaly Extremities: extremities normal, atraumatic, no cyanosis or edema  ECOG PERFORMANCE STATUS: 1 - Symptomatic but completely ambulatory  Blood pressure (!) 153/63, pulse 65,  temperature 97.6 F (36.4 C), temperature source Oral, resp. rate 17, height 6\' 1"  (1.854 m), weight (!) 305 lb (138.3 kg), SpO2 99%.  LABORATORY DATA: Lab Results  Component Value Date   WBC 5.8 10/24/2021   HGB 12.6 (L) 10/24/2021   HCT 38.6 (L) 10/24/2021   MCV 93.0 10/24/2021   PLT 141 (L) 10/24/2021      Chemistry      Component Value Date/Time   NA 142 12/01/2021 1440   NA 141 03/29/2017 1038   K 4.4 12/01/2021 1440   K 4.0 03/29/2017 1038   CL 109 (H) 12/01/2021 1440   CO2 23 12/01/2021 1440   CO2 27 03/29/2017 1038   BUN 31 (H) 12/01/2021 1440   BUN 28.6 (H) 03/29/2017 1038   CREATININE 1.91 (H) 12/01/2021 1440   CREATININE 2.89 (H) 10/24/2021 1406   CREATININE 1.6 (H) 03/29/2017 1038      Component Value Date/Time   CALCIUM 9.5 12/01/2021 1440   CALCIUM 9.3 03/29/2017 1038   ALKPHOS 97 10/24/2021 1406   ALKPHOS 105 03/29/2017 1038   AST 15 10/24/2021 1406   AST 18 03/29/2017 1038   ALT 17 10/24/2021 1406   ALT 13 03/29/2017 1038   BILITOT 0.4 10/24/2021 1406   BILITOT 0.62 03/29/2017 1038       RADIOGRAPHIC STUDIES: No results found.   ASSESSMENT AND PLAN:  This is a very pleasant 72 years old African-American male with stage IIIA non-small cell lung cancer, adenocarcinoma.  The patient completed 6 weeks of concurrent chemoradiation with weekly carboplatin and paclitaxel and tolerated his treatment well except for odynophagia. The patient was then started on consolidation treatment with immunotherapy with Imfinzi (Durvalumab) status post 26 cycles.  The patient is currently on observation and he is feeling fine with no concerning complaints. He had repeat CT scan of the chest performed recently.  The final report is still pending but I personally and independently reviewed the scan images and discussed the result with the patient today.  I do not see any concerning findings for disease progression but I will wait for the final report for confirmation. I  recommended for the patient to continue on observation with repeat CT scan of the chest in 6 months. For the renal insufficiency, he is followed by nephrology and he was advised to avoid any NSAIDs and discussed with his nephrologist for any additional recommendation. He was advised to call immediately if he has any other concerning symptoms in the interval. The patient voices understanding of current disease status and treatment options and is in agreement with the current care plan. All questions were answered. The patient knows to call the clinic with  any problems, questions or concerns. We can certainly see the patient much sooner if necessary. Disclaimer: This note was dictated with voice recognition software. Similar sounding words can inadvertently be transcribed and may not be corrected upon review.

## 2022-11-20 NOTE — Patient Instructions (Addendum)
Medication Instructions:   *If you need a refill on your cardiac medications before your next appointment, please call your pharmacy*   Lab Work: NMR, PRO BNP, CMET  If you have labs (blood work) drawn today and your tests are completely normal, you will receive your results only by: MyChart Message (if you have MyChart) OR A paper copy in the mail If you have any lab test that is abnormal or we need to change your treatment, we will call you to review the results.   Testing/Procedures:    Follow-Up: At [facility], you and your health needs are our priority.  As part of our continuing mission to provide you with exceptional heart care, we have created designated Provider Care Teams.  These Care Teams include your primary Cardiologist (physician) and Advanced Practice Providers (APPs -  Physician Assistants and Nurse Practitioners) who all work together to provide you with the care you need, when you need it.  We recommend signing up for the patient portal called "MyChart".  Sign up information is provided on this After Visit Summary.  MyChart is used to connect with patients for Virtual Visits (Telemedicine).  Patients are able to view lab/test results, encounter notes, upcoming appointments, etc.  Non-urgent messages can be sent to your provider as well.   To learn more about what you can do with MyChart, go to [email or web address].

## 2022-11-21 LAB — COMPREHENSIVE METABOLIC PANEL
ALT: 23 IU/L (ref 0–44)
AST: 17 IU/L (ref 0–40)
Albumin: 4.3 g/dL (ref 3.8–4.8)
Alkaline Phosphatase: 103 IU/L (ref 44–121)
BUN/Creatinine Ratio: 21 (ref 10–24)
BUN: 43 mg/dL — ABNORMAL HIGH (ref 8–27)
Bilirubin Total: 0.2 mg/dL (ref 0.0–1.2)
CO2: 20 mmol/L (ref 20–29)
Calcium: 9.4 mg/dL (ref 8.6–10.2)
Chloride: 110 mmol/L — ABNORMAL HIGH (ref 96–106)
Creatinine, Ser: 2.09 mg/dL — ABNORMAL HIGH (ref 0.76–1.27)
Globulin, Total: 2.4 g/dL (ref 1.5–4.5)
Glucose: 65 mg/dL — ABNORMAL LOW (ref 70–99)
Potassium: 4 mmol/L (ref 3.5–5.2)
Sodium: 144 mmol/L (ref 134–144)
Total Protein: 6.7 g/dL (ref 6.0–8.5)
eGFR: 33 mL/min/{1.73_m2} — ABNORMAL LOW (ref >59)

## 2022-11-21 LAB — NMR, LIPOPROFILE
Cholesterol, Total: 197 mg/dL (ref 100–199)
HDL Particle Number: 24.8 umol/L — ABNORMAL LOW (ref >=30.5)
HDL-C: 55 mg/dL (ref >39)
LDL Particle Number: 1301 nmol/L — ABNORMAL HIGH (ref <1000)
LDL Size: 21.4 nm (ref >20.5)
LDL-C (NIH Calc): 126 mg/dL — ABNORMAL HIGH (ref 0–99)
LP-IR Score: <25 (ref <=45)
Small LDL Particle Number: 311 nmol/L (ref <=527)
Triglycerides: 86 mg/dL (ref 0–149)

## 2022-11-21 LAB — PRO B NATRIURETIC PEPTIDE: NT-Pro BNP: 4310 pg/mL — ABNORMAL HIGH (ref 0–376)

## 2022-11-22 NOTE — Progress Notes (Unsigned)
  Electrophysiology Office Note:   ID:  Jason Russell, DOB 1950-11-02, MRN [id number]  Primary Cardiologist: Dietrich Pates, MD Electrophysiologist: Lewayne Bunting, MD  {Click to update primary MD,subspecialty MD or APP then REFRESH:1}    History of Present Illness:   Jason Russell is a 72 y.o. male with h/o VT s/p ablation 09/2021. NICM,  seen today for routine electrophysiology followup.   Seen by Dr. Tenny Craw 11/20/22. Pt denied SOB, CP, or palpitations. Labs did show elevated BNP.   Since last being seen in our clinic the patient reports doing ***.  he denies chest pain, palpitations, dyspnea, PND, orthopnea, nausea, vomiting, dizziness, syncope, edema, weight gain, or early satiety.   Review of systems complete and found to be negative unless listed in HPI.   EP Information / Studies Reviewed:    EKG is not ordered today. EKG from 11/20/2022 reviewed which showed NSR at 62 bpm with trigeminal and quadrigeminal PVCs   ICD Interrogation-  reviewed in detail today,  See PACEART report.  Device History: Abbott Single Chamber ICD implanted 2014, gen change 2019 for CHF  Risk Assessment/Calculations:   {Does this patient have ATRIAL FIBRILLATION?:[phone]} No BP recorded.  {Refresh Note OR Click here to enter BP  :1}***        Physical Exam:   VS:  There were no vitals taken for this visit.   Wt Readings from Last 3 Encounters:  11/20/22 (!) 309 lb 3.2 oz (140.3 kg)  11/20/22 (!) 305 lb (138.3 kg)  11/02/22 (!) 302 lb 12.8 oz (137.3 kg)     GEN: Well nourished, well developed in no acute distress NECK: No JVD; No carotid bruits CARDIAC: {EPRHYTHM:28826}, no murmurs, rubs, gallops RESPIRATORY:  Clear to auscultation without rales, wheezing or rhonchi  ABDOMEN: Soft, non-tender, non-distended EXTREMITIES:  No edema; No deformity   ASSESSMENT AND PLAN:    Chronic systolic dysfunction s/p Abbott single chamber ICD  EF 05/2018 LVEF 35-40% euvolemic today Stable on an appropriate  medical regimen Normal ICD function See Pace Art report No changes today  VT *** since ablation Continue amiodarone 200 mg daily CMET OK 11/20/2022 TFTs today.   Disposition:   Follow up with {EPPROVIDERS:28135} {EPFOLLOW UP:28173}   Signed, Graciella Freer, PA-C

## 2022-11-23 ENCOUNTER — Encounter: Payer: Self-pay | Admitting: Student

## 2022-11-23 ENCOUNTER — Ambulatory Visit: Payer: Medicare Other | Admitting: Student

## 2022-11-23 VITALS — BP 146/80 | HR 69 | Ht 73.0 in | Wt 306.2 lb

## 2022-11-23 DIAGNOSIS — I428 Other cardiomyopathies: Secondary | ICD-10-CM | POA: Diagnosis not present

## 2022-11-23 DIAGNOSIS — Z95810 Presence of automatic (implantable) cardiac defibrillator: Secondary | ICD-10-CM

## 2022-11-23 DIAGNOSIS — I472 Ventricular tachycardia, unspecified: Secondary | ICD-10-CM

## 2022-11-23 DIAGNOSIS — I5022 Chronic systolic (congestive) heart failure: Secondary | ICD-10-CM

## 2022-11-23 DIAGNOSIS — M542 Cervicalgia: Secondary | ICD-10-CM | POA: Diagnosis not present

## 2022-11-23 LAB — CUP PACEART INCLINIC DEVICE CHECK
Battery Remaining Longevity: 63 mo
Brady Statistic RV Percent Paced: 1.4 %
Date Time Interrogation Session: 20240822090459
HighPow Impedance: 42.2352
Implantable Lead Connection Status: 753985
Implantable Lead Implant Date: 20140703
Implantable Lead Location: 753860
Implantable Pulse Generator Implant Date: 20191113
Lead Channel Impedance Value: 412.5 Ohm
Lead Channel Pacing Threshold Amplitude: 0.75 V
Lead Channel Pacing Threshold Amplitude: 0.75 V
Lead Channel Pacing Threshold Pulse Width: 0.5 ms
Lead Channel Pacing Threshold Pulse Width: 0.5 ms
Lead Channel Sensing Intrinsic Amplitude: 3.8 mV
Lead Channel Setting Pacing Amplitude: 2.5 V
Lead Channel Setting Pacing Pulse Width: 0.5 ms
Lead Channel Setting Sensing Sensitivity: 0.5 mV
Pulse Gen Serial Number: 9820959
Zone Setting Status: 755011

## 2022-11-23 NOTE — Patient Instructions (Signed)
Medication Instructions:  Your physician recommends that you continue on your current medications as directed. Please refer to the Current Medication list given to you today.  *If you need a refill on your cardiac medications before your next appointment, please call your pharmacy*  Lab Work: TSH, FreeT4-TODAY If you have labs (blood work) drawn today and your tests are completely normal, you will receive your results only by: MyChart Message (if you have MyChart) OR A paper copy in the mail If you have any lab test that is abnormal or we need to change your treatment, we will call you to review the results.  Follow-Up: At [facility], you and your health needs are our priority.  As part of our continuing mission to provide you with exceptional heart care, we have created designated Provider Care Teams.  These Care Teams include your primary Cardiologist (physician) and Advanced Practice Providers (APPs -  Physician Assistants and Nurse Practitioners) who all work together to provide you with the care you need, when you need it.   Your next appointment:   1 year(s)  Provider:   Lewayne Bunting, MD

## 2022-11-23 NOTE — Progress Notes (Signed)
EPIC Encounter for ICM Monitoring  Patient Name: Jason Russell is a 72 y.o. male Date: 11/23/2022 Primary Care Physican: Renaye Rakers, MD Primary Cardiologist: Tenny Craw Electrophysiologist: Ladona Ridgel 09/13/2022 Weight: 280 lbs 11/23/2022 Weight: 280 lbs   Spoke with patient and heart failure questions reviewed.  Transmission results reviewed.  Pt asymptomatic for fluid accumulation.  He is getting shoulder PT.   CorVue Thoracic impedance suggesting normal fluid levels with the exception of possible fluid accumulation from 7/26-8/2.   Prescribed:  Furosemide 40 mg take 0.5 tablet (20 mg total) by mouth every other day alternating with 1 tablet (40 mg total) every other day.  Potassium 20 mEq 1 tablet daily. Spironolactone 25 mg take 1 tablet daily   Labs: 11/20/2022 Creatinine 2.09, BUN 43, Potassium 4.0, Sodium 144, GFR 33, BNP 4,310 A complete set of results can be found in results review   Recommendations:   No changes and encouraged to call if experiencing any fluid symptoms.   Follow-up plan: ICM clinic phone appointment on 12/25/2022   91 day device clinic remote transmission 01/15/2023.     EP/Cardiology Office Visits:  Recall 11/18/2023 with Dr Ladona Ridgel.    Copy of ICM check sent to Dr. Ladona Ridgel.     3 month ICM trend: 11/20/2022.    12-14 Month ICM trend:     Karie Soda, RN 11/23/2022 12:55 PM

## 2022-11-24 LAB — TSH: TSH: 3.54 u[IU]/mL (ref 0.450–4.500)

## 2022-11-24 LAB — T4, FREE: Free T4: 1.31 ng/dL (ref 0.82–1.77)

## 2022-11-27 ENCOUNTER — Other Ambulatory Visit: Payer: Self-pay

## 2022-11-27 DIAGNOSIS — Z79899 Other long term (current) drug therapy: Secondary | ICD-10-CM

## 2022-11-27 DIAGNOSIS — I5022 Chronic systolic (congestive) heart failure: Secondary | ICD-10-CM

## 2022-11-27 DIAGNOSIS — I429 Cardiomyopathy, unspecified: Secondary | ICD-10-CM

## 2022-11-27 DIAGNOSIS — I10 Essential (primary) hypertension: Secondary | ICD-10-CM

## 2022-11-27 MED ORDER — FUROSEMIDE 40 MG PO TABS: 40.0000 mg | ORAL_TABLET | Freq: Every day | ORAL | Status: DC

## 2022-11-28 ENCOUNTER — Other Ambulatory Visit: Payer: Self-pay | Admitting: Internal Medicine

## 2022-11-28 DIAGNOSIS — M542 Cervicalgia: Secondary | ICD-10-CM | POA: Diagnosis not present

## 2022-12-05 ENCOUNTER — Ambulatory Visit: Payer: Medicare Other | Attending: Internal Medicine

## 2022-12-05 DIAGNOSIS — I429 Cardiomyopathy, unspecified: Secondary | ICD-10-CM | POA: Diagnosis not present

## 2022-12-05 DIAGNOSIS — I5022 Chronic systolic (congestive) heart failure: Secondary | ICD-10-CM | POA: Diagnosis not present

## 2022-12-05 DIAGNOSIS — Z79899 Other long term (current) drug therapy: Secondary | ICD-10-CM

## 2022-12-05 DIAGNOSIS — I10 Essential (primary) hypertension: Secondary | ICD-10-CM

## 2022-12-06 DIAGNOSIS — M542 Cervicalgia: Secondary | ICD-10-CM | POA: Diagnosis not present

## 2022-12-06 LAB — BASIC METABOLIC PANEL
BUN/Creatinine Ratio: 22 (ref 10–24)
BUN: 55 mg/dL — ABNORMAL HIGH (ref 8–27)
CO2: 18 mmol/L — ABNORMAL LOW (ref 20–29)
Calcium: 9 mg/dL (ref 8.6–10.2)
Chloride: 107 mmol/L — ABNORMAL HIGH (ref 96–106)
Creatinine, Ser: 2.51 mg/dL — ABNORMAL HIGH (ref 0.76–1.27)
Glucose: 82 mg/dL (ref 70–99)
Potassium: 4.5 mmol/L (ref 3.5–5.2)
Sodium: 142 mmol/L (ref 134–144)
eGFR: 27 mL/min/{1.73_m2} — ABNORMAL LOW (ref >59)

## 2022-12-06 LAB — PRO B NATRIURETIC PEPTIDE: NT-Pro BNP: 2033 pg/mL — ABNORMAL HIGH (ref 0–376)

## 2022-12-12 ENCOUNTER — Other Ambulatory Visit: Payer: Self-pay | Admitting: Internal Medicine

## 2022-12-12 ENCOUNTER — Telehealth: Payer: Self-pay

## 2022-12-12 MED ORDER — ENTRESTO 97-103 MG PO TABS: 1.0000 | ORAL_TABLET | Freq: Two times a day (BID) | ORAL | 3 refills | Status: DC

## 2022-12-12 NOTE — Telephone Encounter (Addendum)
Will froward to Dr Tenny Craw to see if she would like to order both.   He actually has a Hydrologist so he can use co-pay cards. As long as PA is approved cost should be $10 for Nexlizet and $10 for Jardiance. If his plan covers Marcelline Deist that could be $0 with coupon.

## 2022-12-12 NOTE — Telephone Encounter (Signed)
-----   Message from Olene Floss sent at 12/01/2022  4:51 PM EDT ----- Regarding: RE: med costs I think I answered this message but pt has commercial retirement plan. Should be able to use copay card and pay 10 ----- Message ----- From: Bertram Millard, RN Sent: 11/27/2022   5:14 PM EDT To: Olene Floss, RPH-CPP Subject: med costs                                      Hey Jason Russell, this is the pt I asked ou about:   Jason Riffle, MD  Bertram Millard, RN LDL 126   SHould be lower with hx of CAD     I would recomm checking cost for Nexzilet FLuid is up   I would recomm increaesing 40 mg  lasix to daily and also checking on cost of Jardiance  Follow up BMET and BNP in 1 wk     Dewayne Hatch

## 2022-12-14 DIAGNOSIS — M542 Cervicalgia: Secondary | ICD-10-CM | POA: Diagnosis not present

## 2022-12-18 ENCOUNTER — Encounter: Payer: Self-pay | Admitting: Internal Medicine

## 2022-12-18 ENCOUNTER — Telehealth: Payer: Self-pay | Admitting: Pharmacy Technician

## 2022-12-18 ENCOUNTER — Other Ambulatory Visit: Payer: Self-pay

## 2022-12-18 ENCOUNTER — Other Ambulatory Visit (HOSPITAL_COMMUNITY): Payer: Self-pay

## 2022-12-18 DIAGNOSIS — I5022 Chronic systolic (congestive) heart failure: Secondary | ICD-10-CM

## 2022-12-18 DIAGNOSIS — Z79899 Other long term (current) drug therapy: Secondary | ICD-10-CM

## 2022-12-18 DIAGNOSIS — I429 Cardiomyopathy, unspecified: Secondary | ICD-10-CM

## 2022-12-18 DIAGNOSIS — I428 Other cardiomyopathies: Secondary | ICD-10-CM

## 2022-12-18 DIAGNOSIS — E785 Hyperlipidemia, unspecified: Secondary | ICD-10-CM

## 2022-12-18 MED ORDER — DAPAGLIFLOZIN PROPANEDIOL 10 MG PO TABS: 10.0000 mg | ORAL_TABLET | Freq: Every day | ORAL | 3 refills | Status: DC

## 2022-12-18 MED ORDER — NEXLIZET 180-10 MG PO TABS: 1.0000 | ORAL_TABLET | Freq: Every day | ORAL | 11 refills | Status: DC

## 2022-12-18 NOTE — Telephone Encounter (Signed)
Pharmacy Patient Advocate Encounter  Insurance verification completed.    The patient is insured through [employer] test claim for nexlizet. Currently a quantity of 30 is a 30 day supply and the co-pay is $0.00 .   This test claim was processed through [facility]- copay amounts may vary at other pharmacies due to pharmacy/plan contracts, or as the patient moves through the different stages of their insurance plan.

## 2022-12-18 NOTE — Telephone Encounter (Signed)
Marcelline Deist coupon BIN# [id number] PCN# [id number] GRP# [id number] ID # [id number]  Patient will need to give this info to pharmacy- explain he does NOT have medicare  Nexlizet copay card info  BIN [phone] PCN [employer] GRP [id number] ID [id number]  Rx team- please do PA for Nexlizet- CAD LDL 126 on atorvastatin 40

## 2022-12-18 NOTE — Telephone Encounter (Signed)
OK for Nexzelit   Follow up lipomed and liver panel in 8 wks OK for [place] or Jardiance     Follow up BMET in 2 wks

## 2022-12-18 NOTE — Telephone Encounter (Signed)
Pt advised and agrees to the meds.. will forward to the Pharmacy to see if they can help with PA for Nexlizet and if they can help with coupon for [place].

## 2022-12-21 DIAGNOSIS — M542 Cervicalgia: Secondary | ICD-10-CM | POA: Diagnosis not present

## 2022-12-25 ENCOUNTER — Ambulatory Visit: Payer: Medicare Other | Attending: Internal Medicine

## 2022-12-25 DIAGNOSIS — I5022 Chronic systolic (congestive) heart failure: Secondary | ICD-10-CM | POA: Diagnosis not present

## 2022-12-25 DIAGNOSIS — Z95810 Presence of automatic (implantable) cardiac defibrillator: Secondary | ICD-10-CM

## 2022-12-27 ENCOUNTER — Other Ambulatory Visit: Payer: Self-pay | Admitting: Internal Medicine

## 2022-12-27 NOTE — Progress Notes (Signed)
EPIC Encounter for ICM Monitoring  Patient Name: Jason Russell is a 72 y.o. male Date: 12/27/2022 Primary Care Physican: Renaye Rakers, MD Primary Cardiologist: Tenny Craw Electrophysiologist: Ladona Ridgel 09/13/2022 Weight: 280 lbs 11/23/2022 Weight: 280 lbs 12/27/2022 Weight: 280 - 281 lbs    Spoke with patient and heart failure questions reviewed.  Transmission results reviewed.  Pt asymptomatic for fluid accumulation.  Did not have any fluid symptoms during decreased impedance.  Diet: Has been eating restaurant foods more frequently during decreased impedance.    CorVue Thoracic impedance suggesting normal fluid levels with the exception of possible fluid accumulation from 9/6-9/12 and 9/14-9/21.   Prescribed:  Furosemide 40 mg take 0.5 tablet (20 mg total) by mouth every other day alternating with 1 tablet (40 mg total) every other day.  Potassium 20 mEq 1 tablet daily. Spironolactone 25 mg take 1 tablet daily   Labs: 11/20/2022 Creatinine 2.09, BUN 43, Potassium 4.0, Sodium 144, GFR 33, BNP 4,310 A complete set of results can be found in results review   Recommendations:   Encouraged to decrease frequency of eating out if possible.  No changes and encouraged to call if experiencing any fluid symptoms.   Follow-up plan: ICM clinic phone appointment on 01/29/2023   91 day device clinic remote transmission 01/15/2023.     EP/Cardiology Office Visits:  Recall 11/18/2023 with Dr Ladona Ridgel.    Copy of ICM check sent to Dr. Ladona Ridgel.     3 month ICM trend: 12/25/2022.    12-14 Month ICM trend:     Karie Soda, RN 12/27/2022 2:31 PM

## 2023-01-10 ENCOUNTER — Other Ambulatory Visit: Payer: Self-pay

## 2023-01-14 ENCOUNTER — Other Ambulatory Visit: Payer: Self-pay | Admitting: Internal Medicine

## 2023-01-15 ENCOUNTER — Telehealth: Payer: Self-pay

## 2023-01-15 ENCOUNTER — Ambulatory Visit (INDEPENDENT_AMBULATORY_CARE_PROVIDER_SITE_OTHER): Payer: Medicare Other

## 2023-01-15 DIAGNOSIS — I5022 Chronic systolic (congestive) heart failure: Secondary | ICD-10-CM | POA: Diagnosis not present

## 2023-01-15 DIAGNOSIS — I428 Other cardiomyopathies: Secondary | ICD-10-CM

## 2023-01-15 DIAGNOSIS — Z79899 Other long term (current) drug therapy: Secondary | ICD-10-CM

## 2023-01-15 NOTE — Telephone Encounter (Signed)
I called to follow up with the pt since I have not seen any new labs for him and he was due 2 weeks after starting his new meds... he will come in this week 01/17/23.. for a BNP and BMET.... he is not due for chol labs until 02/2023.

## 2023-01-17 ENCOUNTER — Ambulatory Visit: Payer: Medicare Other | Attending: Cardiovascular Disease

## 2023-01-17 DIAGNOSIS — I428 Other cardiomyopathies: Secondary | ICD-10-CM

## 2023-01-17 DIAGNOSIS — I5022 Chronic systolic (congestive) heart failure: Secondary | ICD-10-CM

## 2023-01-17 DIAGNOSIS — Z79899 Other long term (current) drug therapy: Secondary | ICD-10-CM

## 2023-01-17 LAB — CUP PACEART REMOTE DEVICE CHECK
Battery Remaining Longevity: 61 mo
Battery Remaining Percentage: 56 %
Battery Voltage: 2.98 V
Brady Statistic RV Percent Paced: 1 %
Date Time Interrogation Session: 20241014045309
HighPow Impedance: 39 Ohm
HighPow Impedance: 40 Ohm
Implantable Lead Connection Status: 753985
Implantable Lead Implant Date: 20140703
Implantable Lead Location: 753860
Implantable Pulse Generator Implant Date: 20191113
Lead Channel Impedance Value: 390 Ohm
Lead Channel Pacing Threshold Amplitude: 0.75 V
Lead Channel Pacing Threshold Pulse Width: 0.5 ms
Lead Channel Sensing Intrinsic Amplitude: 3.5 mV
Lead Channel Setting Pacing Amplitude: 2.5 V
Lead Channel Setting Pacing Pulse Width: 0.5 ms
Lead Channel Setting Sensing Sensitivity: 0.5 mV
Pulse Gen Serial Number: 9820959
Zone Setting Status: 755011

## 2023-01-18 LAB — BASIC METABOLIC PANEL
BUN/Creatinine Ratio: 20 (ref 10–24)
BUN: 42 mg/dL — ABNORMAL HIGH (ref 8–27)
CO2: 22 mmol/L (ref 20–29)
Calcium: 9.5 mg/dL (ref 8.6–10.2)
Chloride: 109 mmol/L — ABNORMAL HIGH (ref 96–106)
Creatinine, Ser: 2.07 mg/dL — ABNORMAL HIGH (ref 0.76–1.27)
Glucose: 83 mg/dL (ref 70–99)
Potassium: 3.9 mmol/L (ref 3.5–5.2)
Sodium: 144 mmol/L (ref 134–144)
eGFR: 34 mL/min/{1.73_m2} — ABNORMAL LOW (ref >59)

## 2023-01-18 LAB — PRO B NATRIURETIC PEPTIDE: NT-Pro BNP: 3490 pg/mL — ABNORMAL HIGH (ref 0–376)

## 2023-01-22 ENCOUNTER — Other Ambulatory Visit: Payer: Self-pay

## 2023-01-22 DIAGNOSIS — Z79899 Other long term (current) drug therapy: Secondary | ICD-10-CM

## 2023-01-22 DIAGNOSIS — I5022 Chronic systolic (congestive) heart failure: Secondary | ICD-10-CM

## 2023-01-22 MED ORDER — FUROSEMIDE 40 MG PO TABS: ORAL_TABLET | ORAL | Status: DC

## 2023-01-24 ENCOUNTER — Other Ambulatory Visit: Payer: Self-pay

## 2023-01-24 MED ORDER — SILDENAFIL CITRATE 100 MG PO TABS: 100.0000 mg | ORAL_TABLET | ORAL | 1 refills | Status: DC | PRN

## 2023-01-29 ENCOUNTER — Ambulatory Visit: Payer: Medicare Other | Attending: Internal Medicine

## 2023-01-29 DIAGNOSIS — I5022 Chronic systolic (congestive) heart failure: Secondary | ICD-10-CM

## 2023-01-29 DIAGNOSIS — Z95810 Presence of automatic (implantable) cardiac defibrillator: Secondary | ICD-10-CM | POA: Diagnosis not present

## 2023-01-30 NOTE — Progress Notes (Signed)
Remote ICD transmission.   

## 2023-01-31 ENCOUNTER — Other Ambulatory Visit: Payer: Self-pay | Admitting: Internal Medicine

## 2023-02-02 NOTE — Progress Notes (Signed)
EPIC Encounter for ICM Monitoring  Patient Name: Jason Russell is a 72 y.o. male Date: 02/02/2023 Primary Care Physican: Renaye Rakers, MD Primary Cardiologist: Tenny Craw Electrophysiologist: Ladona Ridgel 09/13/2022 Weight: 280 lbs 11/23/2022 Weight: 280 lbs 12/27/2022 Weight: 280 - 281 lbs     Spoke with patient and heart failure questions reviewed.  Transmission results reviewed.  Pt asymptomatic dehydration or fluid accumulation.  Reports feeling well at this time and voices no complaints.   Discussed symptoms of dehydration.    Diet: Has been eating restaurant foods more frequently during decreased impedance.    CorVue Thoracic impedance suggesting possible fluid accumulation from 10/9-10/22 followed by possible dryness starting 10/23 but trending back toward baseline.   Prescribed:  Furosemide 40 mg take 1 tablet (40 mg total) by mouth daily with extra tablet (80 mg total) twice a week only.  Potassium 20 mEq 1 tablet daily. Spironolactone 25 mg take 1 tablet daily   Labs: 01/17/2023 Creatinine 2.07, BUN 42, Potassium 3.9, Sodium 144, GFR 34 12/05/2022 Creatinine 2.51, BUN 55, Potassium 4.5, Sodium 142, GFR 27 11/20/2022 Creatinine 2.09, BUN 43, Potassium 4.0, Sodium 144, GFR 33, BNP 4,310 A complete set of results can be found in results review   Recommendations:   Advised to drink 64 oz fluid daily to stay hydrated.    Follow-up plan: ICM clinic phone appointment on 02/05/2023 to recheck fluid levels.   91 day device clinic remote transmission 04/16/2023.     EP/Cardiology Office Visits:   Recall 05/19/2023 with Dr Tenny Craw (6 month).  Recall 11/18/2023 with Dr Ladona Ridgel.    Copy of ICM check sent to Dr. Ladona Ridgel.     3 month ICM trend: 01/29/2023.    12-14 Month ICM trend:     Karie Soda, RN 02/02/2023 9:18 AM

## 2023-02-05 ENCOUNTER — Ambulatory Visit: Payer: Medicare Other | Attending: Internal Medicine

## 2023-02-05 DIAGNOSIS — Z95810 Presence of automatic (implantable) cardiac defibrillator: Secondary | ICD-10-CM

## 2023-02-05 DIAGNOSIS — I5022 Chronic systolic (congestive) heart failure: Secondary | ICD-10-CM

## 2023-02-05 NOTE — Progress Notes (Signed)
EPIC Encounter for ICM Monitoring  Patient Name: Jason Russell is a 72 y.o. male Date: 02/05/2023 Primary Care Physican: Renaye Rakers, MD Primary Cardiologist: Tenny Craw Electrophysiologist: Ladona Ridgel 09/13/2022 Weight: 280 lbs 11/23/2022 Weight: 280 lbs 12/27/2022 Weight: 280 - 281 lbs 02/05/2023 Weight: Not weighing at home.      Spoke with patient and heart failure questions reviewed.  Transmission results reviewed.  Pt asymptomatic dehydration or fluid accumulation.  Reports feeling well at this time and voices no complaints.   Discussed symptoms of dehydration.    Diet:   Discussed balancing fluid intake and limit to 64 oz daily but may need more if he is outside in the heat and sweating.  He does cook at home.   CorVue Thoracic impedance suggesting changed from possible dryness 10/23-10/29 to possible fluid accumulation starting 11/1.   Prescribed:  Furosemide 40 mg take 1 tablet (40 mg total) by mouth daily with extra tablet (80 mg total) twice a week only.   Confirmed 11/4 he is taking the prescribed dosage. Potassium 20 mEq 1 tablet daily. Spironolactone 25 mg take 1 tablet daily   Labs: 01/17/2023 Creatinine 2.07, BUN 42, Potassium 3.9, Sodium 144, GFR 34 12/05/2022 Creatinine 2.51, BUN 55, Potassium 4.5, Sodium 142, GFR 27 11/20/2022 Creatinine 2.09, BUN 43, Potassium 4.0, Sodium 144, GFR 33, BNP 4,310 A complete set of results can be found in results review   Recommendations:   Advised to limit 64 oz fluid daily and to limit salt intake.     Follow-up plan: ICM clinic phone appointment on 02/12/2023 to recheck fluid levels.   91 day device clinic remote transmission 04/16/2023.     EP/Cardiology Office Visits:   Recall 05/19/2023 with Dr Tenny Craw (6 month).  Recall 11/18/2023 with Dr Ladona Ridgel.    Copy of ICM check sent to Dr. Ladona Ridgel and Dr Tenny Craw for Review and recommendations if needed.      3 month ICM trend: 02/05/2023.    12-14 Month ICM trend:     Karie Soda,  RN 02/05/2023 3:16 PM

## 2023-02-12 ENCOUNTER — Ambulatory Visit: Payer: Medicare Other | Attending: Internal Medicine

## 2023-02-12 DIAGNOSIS — I5022 Chronic systolic (congestive) heart failure: Secondary | ICD-10-CM

## 2023-02-12 DIAGNOSIS — N183 Chronic kidney disease, stage 3 unspecified: Secondary | ICD-10-CM | POA: Diagnosis not present

## 2023-02-12 DIAGNOSIS — I129 Hypertensive chronic kidney disease with stage 1 through stage 4 chronic kidney disease, or unspecified chronic kidney disease: Secondary | ICD-10-CM | POA: Diagnosis not present

## 2023-02-12 DIAGNOSIS — Z95810 Presence of automatic (implantable) cardiac defibrillator: Secondary | ICD-10-CM

## 2023-02-14 NOTE — Progress Notes (Signed)
EPIC Encounter for ICM Monitoring  Patient Name: Jason Russell is a 72 y.o. male Date: 02/14/2023 Primary Care Physican: Renaye Rakers, MD Primary Cardiologist: Tenny Craw Electrophysiologist: Ladona Ridgel 09/13/2022 Weight: 280 lbs 11/23/2022 Weight: 280 lbs 12/27/2022 Weight: 280 - 281 lbs 02/05/2023 Weight: Not weighing at home.      Spoke with patient and heart failure questions reviewed.  Transmission results reviewed.  Pt asymptomatic for fluid accumulation.  Reports feeling well at this time and voices no complaints.      Diet:  11/4 Discussed balancing fluid intake and limit to 64 oz daily but may need more if he is outside in the heat and sweating.  He does cook at home.   CorVue Thoracic impedance suggesting fluid levels returned to normal.   Prescribed:  Furosemide 40 mg take 1 tablet (40 mg total) by mouth daily with extra tablet (80 mg total) twice a week only.   Confirmed 11/4 he is taking the prescribed dosage. Potassium 20 mEq 1 tablet daily. Spironolactone 25 mg take 1 tablet daily   Labs: 01/17/2023 Creatinine 2.07, BUN 42, Potassium 3.9, Sodium 144, GFR 34 12/05/2022 Creatinine 2.51, BUN 55, Potassium 4.5, Sodium 142, GFR 27 11/20/2022 Creatinine 2.09, BUN 43, Potassium 4.0, Sodium 144, GFR 33, BNP 4,310 A complete set of results can be found in results review   Recommendations:   No changes and encouraged to call if experiencing any fluid symptoms.    Follow-up plan: ICM clinic phone appointment on 03/12/2023.   91 day device clinic remote transmission 04/16/2023.     EP/Cardiology Office Visits:   Recall 05/19/2023 with Dr Tenny Craw (6 month).  Recall 11/18/2023 with Dr Ladona Ridgel.    Copy of ICM check sent to Dr. Ladona Ridgel   3 month ICM trend: 02/12/2023.    12-14 Month ICM trend:     Karie Soda, RN 02/14/2023 3:39 PM

## 2023-02-18 ENCOUNTER — Other Ambulatory Visit: Payer: Self-pay | Admitting: Internal Medicine

## 2023-02-22 ENCOUNTER — Telehealth: Payer: Self-pay | Admitting: Adult Health

## 2023-02-22 DIAGNOSIS — I129 Hypertensive chronic kidney disease with stage 1 through stage 4 chronic kidney disease, or unspecified chronic kidney disease: Secondary | ICD-10-CM | POA: Diagnosis not present

## 2023-02-22 DIAGNOSIS — N1832 Chronic kidney disease, stage 3b: Secondary | ICD-10-CM | POA: Diagnosis not present

## 2023-02-22 DIAGNOSIS — D631 Anemia in chronic kidney disease: Secondary | ICD-10-CM | POA: Diagnosis not present

## 2023-02-22 DIAGNOSIS — E559 Vitamin D deficiency, unspecified: Secondary | ICD-10-CM | POA: Diagnosis not present

## 2023-02-22 DIAGNOSIS — M898X9 Other specified disorders of bone, unspecified site: Secondary | ICD-10-CM | POA: Diagnosis not present

## 2023-02-22 DIAGNOSIS — I1310 Hypertensive heart and chronic kidney disease without heart failure, with stage 1 through stage 4 chronic kidney disease, or unspecified chronic kidney disease: Secondary | ICD-10-CM | POA: Diagnosis not present

## 2023-02-22 NOTE — Telephone Encounter (Signed)
Patient needs refill of Albuterol inhaler.   Pharmacy [facility] on [address]

## 2023-02-25 ENCOUNTER — Other Ambulatory Visit: Payer: Self-pay | Admitting: Internal Medicine

## 2023-02-27 MED ORDER — ALBUTEROL SULFATE HFA 108 (90 BASE) MCG/ACT IN AERS: 2.0000 | INHALATION_SPRAY | RESPIRATORY_TRACT | 5 refills | Status: DC | PRN

## 2023-02-27 NOTE — Telephone Encounter (Signed)
Refill sent.

## 2023-03-12 ENCOUNTER — Ambulatory Visit: Payer: Medicare Other | Attending: Internal Medicine

## 2023-03-12 DIAGNOSIS — I5022 Chronic systolic (congestive) heart failure: Secondary | ICD-10-CM | POA: Diagnosis not present

## 2023-03-12 DIAGNOSIS — Z95810 Presence of automatic (implantable) cardiac defibrillator: Secondary | ICD-10-CM | POA: Diagnosis not present

## 2023-03-13 NOTE — Progress Notes (Signed)
EPIC Encounter for ICM Monitoring  Patient Name: Jason Russell is a 72 y.o. male Date: 03/13/2023 Primary Care Physican: Renaye Rakers, MD Primary Cardiologist: Tenny Craw Electrophysiologist: Ladona Ridgel 09/13/2022 Weight: 280 lbs 11/23/2022 Weight: 280 lbs 12/27/2022 Weight: 280 - 281 lbs 02/05/2023 Weight: Not weighing at home.      Spoke with patient and heart failure questions reviewed.  Transmission results reviewed.  Pt asymptomatic for fluid accumulation.  Reports feeling well at this time and voices no complaints.   Patient was traveling during decreased impedance.   Diet:  11/4 Discussed balancing fluid intake and limit to 64 oz daily but may need more if he is outside in the heat and sweating.  He does cook at home.   CorVue Thoracic impedance suggesting normal fluid levels with the exception of possible fluid accumulation from 11/29-12/4 and 12/5-12/09.   Prescribed:  Furosemide 40 mg take 1 tablet (40 mg total) by mouth daily with extra tablet (80 mg total) twice a week only.   Confirmed 11/4 he is taking the prescribed dosage. Potassium 20 mEq 1 tablet daily. Spironolactone 25 mg take 1 tablet daily   Labs: 01/17/2023 Creatinine 2.07, BUN 42, Potassium 3.9, Sodium 144, GFR 34 12/05/2022 Creatinine 2.51, BUN 55, Potassium 4.5, Sodium 142, GFR 27 11/20/2022 Creatinine 2.09, BUN 43, Potassium 4.0, Sodium 144, GFR 33, BNP 4,310 A complete set of results can be found in results review   Recommendations:   Recommendation to limit salt intake to 2000 mg daily and fluid intake to 64 oz daily.  Encouraged to call if experiencing any fluid symptoms.    Follow-up plan: ICM clinic phone appointment on 04/17/2023.   91 day device clinic remote transmission 04/16/2023.     EP/Cardiology Office Visits:   Recall 05/19/2023 with Dr Tenny Craw (6 month).  Recall 11/18/2023 with Dr Ladona Ridgel.    Copy of ICM check sent to Dr. Ladona Ridgel.  3 month ICM trend: 03/13/2023.    12-14 Month ICM trend:     Karie Soda, RN 03/13/2023 4:35 PM

## 2023-03-20 ENCOUNTER — Other Ambulatory Visit: Payer: Self-pay | Admitting: Internal Medicine

## 2023-04-09 ENCOUNTER — Encounter: Payer: Self-pay | Admitting: Internal Medicine

## 2023-04-16 ENCOUNTER — Ambulatory Visit: Payer: Medicare Other | Attending: Internal Medicine

## 2023-04-16 DIAGNOSIS — I5022 Chronic systolic (congestive) heart failure: Secondary | ICD-10-CM

## 2023-04-16 DIAGNOSIS — I428 Other cardiomyopathies: Secondary | ICD-10-CM

## 2023-04-16 LAB — CUP PACEART REMOTE DEVICE CHECK
Battery Remaining Longevity: 59 mo
Battery Remaining Percentage: 54 %
Battery Voltage: 2.98 V
Brady Statistic RV Percent Paced: 1 %
Date Time Interrogation Session: 20250113062557
HighPow Impedance: 46 Ohm
HighPow Impedance: 46 Ohm
Implantable Lead Connection Status: 753985
Implantable Lead Implant Date: 20140703
Implantable Lead Location: 753860
Implantable Pulse Generator Implant Date: 20191113
Lead Channel Impedance Value: 480 Ohm
Lead Channel Pacing Threshold Amplitude: 0.75 V
Lead Channel Pacing Threshold Pulse Width: 0.5 ms
Lead Channel Sensing Intrinsic Amplitude: 4.4 mV
Lead Channel Setting Pacing Amplitude: 2.5 V
Lead Channel Setting Pacing Pulse Width: 0.5 ms
Lead Channel Setting Sensing Sensitivity: 0.5 mV
Pulse Gen Serial Number: 9820959
Zone Setting Status: 755011

## 2023-04-17 ENCOUNTER — Ambulatory Visit: Payer: Medicare Other | Attending: Internal Medicine

## 2023-04-17 DIAGNOSIS — Z95810 Presence of automatic (implantable) cardiac defibrillator: Secondary | ICD-10-CM | POA: Diagnosis not present

## 2023-04-17 DIAGNOSIS — Z961 Presence of intraocular lens: Secondary | ICD-10-CM | POA: Diagnosis not present

## 2023-04-17 DIAGNOSIS — I5022 Chronic systolic (congestive) heart failure: Secondary | ICD-10-CM

## 2023-04-17 DIAGNOSIS — H40013 Open angle with borderline findings, low risk, bilateral: Secondary | ICD-10-CM | POA: Diagnosis not present

## 2023-04-17 DIAGNOSIS — H04123 Dry eye syndrome of bilateral lacrimal glands: Secondary | ICD-10-CM | POA: Diagnosis not present

## 2023-04-20 NOTE — Progress Notes (Signed)
 EPIC Encounter for ICM Monitoring  Patient Name: Jason Russell is a 73 y.o. male Date: 04/20/2023 Primary Care Physican: [name], MD Primary Cardiologist: [name] Electrophysiologist: [name] 09/13/2022 Weight: 280 lbs 11/23/2022 Weight: 280 lbs 12/27/2022 Weight: 280 - 281 lbs 02/05/2023 Weight: Not weighing at home.      Spoke with patient and heart failure questions reviewed.  Transmission results reviewed.  Pt asymptomatic for fluid accumulation.  Reports feeling well at this time and voices no complaints.   Patient was traveling during decreased impedance.   Diet:  1/17 Advised to drink approximately 64 oz daily to maintain hydration.  He does cook at home.   CorVue Thoracic impedance suggesting normal fluid levels with the exception of possible dryness from 1/3-1/16.   Prescribed:  Furosemide  40 mg take 1 tablet (40 mg total) by mouth daily with extra tablet (80 mg total) twice a week only.   Potassium 20 mEq 1 tablet daily. Spironolactone  25 mg take 1 tablet daily   Labs: 02/12/2023 Creatinine 2.13, BUN 51 Potassium 4.3, Sodium 141, GFR 32 01/17/2023 Creatinine 2.07, BUN 42, Potassium 3.9, Sodium 144, GFR 34 12/05/2022 Creatinine 2.51, BUN 55, Potassium 4.5, Sodium 142, GFR 27 11/20/2022 Creatinine 2.09, BUN 43, Potassium 4.0, Sodium 144, GFR 33, BNP 4,310 A complete set of results can be found in results review   Recommendations:   Recommendation to limit salt intake to 2000 mg daily and fluid intake to 64 oz daily.  Encouraged to call if experiencing any fluid symptoms.    Follow-up plan: ICM clinic phone appointment on 05/21/2023.   91 day device clinic remote transmission 07/16/2023.     EP/Cardiology Office Visits:   Recall 05/19/2023 with Dr [name] (6 month).  Recall 11/18/2023 with Dr [name].    Copy of ICM check sent to Dr. [name].  3 month ICM trend: 04/17/2023.    12-14 Month ICM trend:     [name], RN 04/20/2023 3:52 PM

## 2023-04-21 ENCOUNTER — Other Ambulatory Visit: Payer: Self-pay | Admitting: Internal Medicine

## 2023-04-26 ENCOUNTER — Other Ambulatory Visit: Payer: Self-pay | Admitting: Internal Medicine

## 2023-05-04 ENCOUNTER — Telehealth: Payer: Self-pay | Admitting: *Deleted

## 2023-05-04 NOTE — Telephone Encounter (Signed)
ATC patient x1.  LVM regarding PFT.  Test has not been done.  I was calling to get it scheduled.  He has an appointment with Tammy on Monday 2/3 at 2 pm.  There is currently an 8:30 am slot open if he would like to do it then.  Please schedule PFT when he calls back.  Thank you.

## 2023-05-07 ENCOUNTER — Encounter: Payer: Self-pay | Admitting: Internal Medicine

## 2023-05-07 ENCOUNTER — Ambulatory Visit (INDEPENDENT_AMBULATORY_CARE_PROVIDER_SITE_OTHER): Payer: Medicare Other | Admitting: Adult Health

## 2023-05-07 ENCOUNTER — Ambulatory Visit: Payer: Medicare Other

## 2023-05-07 ENCOUNTER — Encounter: Payer: Self-pay | Admitting: Adult Health

## 2023-05-07 VITALS — BP 150/76 | HR 73

## 2023-05-07 DIAGNOSIS — J430 Unilateral pulmonary emphysema [MacLeod's syndrome]: Secondary | ICD-10-CM | POA: Diagnosis not present

## 2023-05-07 DIAGNOSIS — C3411 Malignant neoplasm of upper lobe, right bronchus or lung: Secondary | ICD-10-CM

## 2023-05-07 DIAGNOSIS — G4733 Obstructive sleep apnea (adult) (pediatric): Secondary | ICD-10-CM

## 2023-05-07 DIAGNOSIS — I5022 Chronic systolic (congestive) heart failure: Secondary | ICD-10-CM | POA: Diagnosis not present

## 2023-05-07 LAB — PULMONARY FUNCTION TEST
DL/VA % pred: 89 %
DL/VA: 3.57 ml/min/mmHg/L
DLCO cor % pred: 59 %
DLCO cor: 16.77 ml/min/mmHg
DLCO unc % pred: 59 %
DLCO unc: 16.77 ml/min/mmHg
FEF 25-75 Pre: 1.75 L/s
FEF2575-%Pred-Pre: 64 %
FEV1-%Pred-Pre: 57 %
FEV1-Pre: 2.09 L
FEV1FVC-%Pred-Pre: 104 %
FEV6-%Pred-Pre: 58 %
FEV6-Pre: 2.72 L
FEV6FVC-%Pred-Pre: 105 %
FVC-%Pred-Pre: 55 %
FVC-Pre: 2.73 L
Pre FEV1/FVC ratio: 77 %
Pre FEV6/FVC Ratio: 100 %

## 2023-05-07 NOTE — Assessment & Plan Note (Signed)
History of stage IIIa non-small cell lung cancer followed by oncology.  CT chest October 2024 showed stable changes.  Has an upcoming CT chest later this month and follow-up with oncology   Plan  Patient Instructions  Continue on TRELEGY daily , rinse after use Activity as tolerated.  Albuterol inhaler as needed  Follow up with Oncology as planned  Continue with surveillance CT scans.  Continue on CPAP at bedtime Do not drive if sleepy Work on [employer] .  Order for new CPAP machine    Follow up with Dr. Delton Coombes or Hayla Hinger NP In 6 months and As needed

## 2023-05-07 NOTE — Assessment & Plan Note (Signed)
Appears euvolemic on exam.  Continue follow-up with cardiology 

## 2023-05-07 NOTE — Patient Instructions (Addendum)
Continue on TRELEGY daily , rinse after use Activity as tolerated.  Albuterol inhaler as needed  Follow up with Oncology as planned  Continue with surveillance CT scans.  Continue on CPAP at bedtime Do not drive if sleepy Work on [employer] .  Order for new CPAP machine    Follow up with Dr. Delton Coombes or Dollye Glasser NP In 6 months and As needed

## 2023-05-07 NOTE — Assessment & Plan Note (Signed)
Appears stable.  PFTs are slightly improved.  Continue on current regimen with triple therapy maintenance inhaler activity as tolerated  Plan  Patient Instructions  Continue on TRELEGY daily , rinse after use Activity as tolerated.  Albuterol inhaler as needed  Follow up with Oncology as planned  Continue with surveillance CT scans.  Continue on CPAP at bedtime Do not drive if sleepy Work on [employer] .  Order for new CPAP machine    Follow up with Dr. Delton Coombes or Dawanda Mapel NP In 6 months and As needed

## 2023-05-07 NOTE — Progress Notes (Signed)
Spirometry/diffusion capacity.

## 2023-05-07 NOTE — Assessment & Plan Note (Signed)
Excellent control compliance on nocturnal CPAP.  Continue current settings.  Order for new CPAP sent to DME  Plan  Patient Instructions  Continue on TRELEGY daily , rinse after use Activity as tolerated.  Albuterol inhaler as needed  Follow up with Oncology as planned  Continue with surveillance CT scans.  Continue on CPAP at bedtime Do not drive if sleepy Work on [employer] .  Order for new CPAP machine    Follow up with Dr. Delton Coombes or Kavita Bartl NP In 6 months and As needed

## 2023-05-07 NOTE — Progress Notes (Signed)
He also works for [employer]  @Patient  ID: Jason Russell, male    DOB: Mar 29, 1951, 73 y.o.   MRN: [id number]  Chief Complaint  Patient presents with   Follow-up    Referring provider: Renaye Rakers, MD  HPI: 73 year old male followed for severe COPD, severe obstructive sleep apnea, history of non-small cell lung cancer (stage IIIa non-small cell lung cancer, adenocarcinoma the right upper lobe in addition to mediastinal lymphadenopathy status post chemo, immunotherapy and XRT) Medical history significant for nonischemic heart disease, cardiomyopathy, congestive heart failure, A-fib status post ICD implant on amiodarone and Xarelto  TEST/EVENTS :  07/31/16: FVC 1.84 L (41%) FEV1 1.14 L (42%) FEV1/FVC 0.77 FEF 25-75 1.17 L (38%) positive bronchodilator response TLC 5.76 L (75%) RV 140% ERV 48% DLCO uncorrected 52%     IMAGING PET CT 07/31/16 Markedly hypermetabolic posterior right upper lobe pulmonary lesion consistent with malignancy and hypermetabolic mediastinal lymph node metastases.   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 This obscures the regional right upper lobe apical lung nodule. No obvious nodular growth or recurrent thoracic adenopathy.   PATHOLOGY FNA RUL 09/06/16:  Adenocarcinoma  EBUS 4R & 7 08/09/16:  Adenocarcinoma    CT chest October 18, 2020 shows stable posttreatment changes of radiation fibrosis in the right hemothorax with no evidence to suggest local recurrence of disease or definite metastatic disease in the thorax.  Very mild emphysema.   CT chest October 25, 2021 showed no new or progressive findings in that chest with evidence of posttreatment change in the right hilum and right paramediastinal lung.  05/07/2023 Follow up ; COPD, Lung cancer , OSA  Patient returns for a 80-month follow-up.  Patient has underlying obstructive sleep apnea.  He remains on CPAP at bedtime.  Patient feels that he is benefiting from CPAP with decreased daytime  sleepiness.  Wears a CPAP every single night.  CPAP download shows excellent compliance with daily average usage at 9 hours.  Patient is on auto CPAP 5 to 20 cm H2O.  AHI is 2.7/hour.  Patient is using a fullface mask.  Patient needs a new CPAP.  His CPAP is getting old is not being quite as well.  Patient has severe COPD.  He remains on Trelegy daily.  Denies any increased albuterol use.  Says overall breathing is doing okay.  He gets short of breath with prolonged walking or going upstairs.  Influenza, Prevnar 20 are up-to-date.  Patient had PFTs that show improved lung capacity with FEV1 at 57%, ratio 77, FVC 55%, DLCO 59%.  We reviewed his PFTs in detail.  He has a history of stage IIIa non-small cell lung cancer followed by oncology.  CT chest January 15, 2023 showed stable postradiation scarring in the medial right hemothorax no evidence of recurrent or metastatic disease.  Tiny right pleural effusion.  Stable 4.1 cm ascending aortic aneurysm.  Patient has an upcoming CT chest May 16, 2023 and follow-up with oncology.  Patient denies any hemoptysis or unintentional weight loss.  No Known Allergies  Immunization History  Administered Date(s) Administered   Fluad Quad(high Dose 65+) 12/06/2021, 01/17/2023   Influenza Split 10/27/2016   Influenza, High Dose Seasonal PF 11/14/2016, 11/24/2018   Influenza,inj,Quad PF,6+ Mos 12/08/2015   Influenza,inj,quad, With Preservative 12/03/2019   Influenza-Unspecified 11/02/2014, 12/08/2015, 10/27/2016   PFIZER Comirnaty(Gray Top)Covid-19 Tri-Sucrose Vaccine 05/10/2019, 01/03/2020, 01/19/2023   PFIZER(Purple Top)SARS-COV-2 Vaccination 05/10/2019, 06/02/2019   PNEUMOCOCCAL CONJUGATE-20 12/25/2021   Pneumococcal Conjugate-13 11/14/2016   Pneumococcal Polysaccharide-23 10/16/2013  Pneumococcal-Unspecified 10/27/2016    Past Medical History:  Diagnosis Date   Adenocarcinoma of right lung, stage 3 (HCC) 08/23/2016   Anemia    Arthritis    "hx  right hip"   Asthma    "when I was a child"   Atrial fibrillation (HCC)    Amiodarone started 10/2011; Coumadin   Automatic implantable cardioverter-defibrillator in situ 10/03/2012   a. St. Jude ICD implantation 10/03/12.   Chronic anticoagulation    Chronic fatigue 10/18/2016   Chronic systolic heart failure (HCC)    a. Echo 7/13: EF 25%;  b. echo 04/2012:  Mild LVH, EF 30-35%, Gr 1 DD, mild AI, mild MR, mild LAE   COPD (chronic obstructive pulmonary disease) (HCC)    Dyslipidemia    Gout    History of blood transfusion 10/15/2013   "don't know where the blood's going; HgB down to 5"   Hyperlipidemia    Hypertension    Hypothyroidism    NICM (nonischemic cardiomyopathy) (HCC)    LHC 4/14:  minimal CAD   Obesity    OSA on CPAP    Tobacco abuse     Tobacco History: Social History   Tobacco Use  Smoking Status Former   Current packs/day: 0.00   Average packs/day: 0.3 packs/day for 45.0 years (11.3 ttl pk-yrs)   Types: Cigarettes   Start date: 07/12/1971   Quit date: 07/11/2016   Years since quitting: 6.8  Smokeless Tobacco Never   Counseling given: Not Answered   Outpatient Medications Prior to Visit  Medication Sig Dispense Refill   albuterol (VENTOLIN HFA) 108 (90 Base) MCG/ACT inhaler Inhale 2 puffs into the lungs every 4 (four) hours as needed for wheezing or shortness of breath. 8.5 g 5   amiodarone (PACERONE) 200 MG tablet TAKE 1 TABLET (200 MG TOTAL) BY MOUTH DAILY. TAKE DAILY EXCEPT SATURDAY AND SUNDAY 90 tablet 2   atorvastatin (LIPITOR) 40 MG tablet TAKE 1 TABLET BY MOUTH EVERY DAY 90 tablet 3   Bempedoic Acid-Ezetimibe (NEXLIZET) 180-10 MG TABS Take 1 tablet by mouth daily in the afternoon. 30 tablet 11   bisoprolol (ZEBETA) 5 MG tablet TAKE 1 TABLET (5 MG TOTAL) BY MOUTH DAILY. 90 tablet 3   [employer] D3 2000 units CAPS Take 2,000 Units by mouth daily.   11   dapagliflozin propanediol (FARXIGA) 10 MG TABS tablet Take 1 tablet (10 mg total) by mouth daily before  breakfast. 90 tablet 3   ferrous gluconate (FERGON) 324 MG tablet TAKE 1 TABLET BY MOUTH THREE TIMES A DAY WITH MEALS 270 tablet 3   Fluticasone-Umeclidin-Vilant (TRELEGY ELLIPTA) 100-62.5-25 MCG/ACT AEPB INHALE 1 PUFF BY MOUTH EVERY DAY 180 each 3   furosemide (LASIX) 40 MG tablet Take one tablet (40 mg) by mouth daily with an extra tablet (80 mg total) twice a week only.     levothyroxine (SYNTHROID) 50 MCG tablet TAKE 1 TABLET BY MOUTH EVERY DAY BEFORE BREAKFAST 90 tablet 2   magnesium oxide (MAG-OX) 400 (241.3 Mg) MG tablet Take 1 tablet (400 mg total) by mouth daily. 30 tablet 0   Polyvinyl Alcohol-Povidone (REFRESH OP) Place 1 drop into both eyes every morning.     Potassium Chloride ER 20 MEQ TBCR TAKE 1 TABLET BY MOUTH EVERY DAY 90 tablet 2   sacubitril-valsartan (ENTRESTO) 97-103 MG Take 1 tablet by mouth 2 (two) times daily. 180 tablet 3   sildenafil (VIAGRA) 100 MG tablet TAKE 1 TABLET BY MOUTH EVERY DAY AS NEEDED FOR ERECTILE DYSFUNCTION  30 tablet 1   spironolactone (ALDACTONE) 25 MG tablet TAKE 1 TABLET (25 MG TOTAL) BY MOUTH DAILY. 90 tablet 2   ULORIC 40 MG tablet Take 1 tablet (40 mg total) by mouth daily. 30 tablet 0   XARELTO 20 MG TABS tablet TAKE 1 TABLET BY MOUTH EVERY DAY 90 tablet 1   No facility-administered medications prior to visit.     Review of Systems:   Constitutional:   No  weight loss, night sweats,  Fevers, chills, +fatigue, or  lassitude.  HEENT:   No headaches,  Difficulty swallowing,  Tooth/dental problems, or  Sore throat,                No sneezing, itching, ear ache, nasal congestion, post nasal drip,   CV:  No chest pain,  Orthopnea, PND, swelling in lower extremities, anasarca, dizziness, palpitations, syncope.   GI  No heartburn, indigestion, abdominal pain, nausea, vomiting, diarrhea, change in bowel habits, loss of appetite, bloody stools.   Resp: .  No chest wall deformity  Skin: no rash or lesions.  GU: no dysuria, change in color of  urine, no urgency or frequency.  No flank pain, no hematuria   MS:  No joint pain or swelling.  No decreased range of motion.  No back pain.    Physical Exam  BP (!) 150/76 (BP Location: Left Arm, Patient Position: Sitting, Cuff Size: Large)   Pulse 73   SpO2 96%   GEN: A/Ox3; pleasant , NAD, well nourished    HEENT:  [place]/AT,   NOSE-clear, THROAT-clear, no lesions, no postnasal drip or exudate noted.   NECK:  Supple w/ fair ROM; no JVD; normal carotid impulses w/o bruits; no thyromegaly or nodules palpated; no lymphadenopathy.    RESP  Clear  P & A; w/o, wheezes/ rales/ or rhonchi. no accessory muscle use, no dullness to percussion  CARD:  RRR, no m/r/g, no peripheral edema, pulses intact, no cyanosis or clubbing.  GI:   Soft & nt; nml bowel sounds; no organomegaly or masses detected.   Musco: Warm bil, no deformities or joint swelling noted.   Neuro: alert, no focal deficits noted.    Skin: Warm, no lesions or rashes    Lab Results:  CBC    BNP   Imaging: CUP PACEART REMOTE DEVICE CHECK Result Date: 04/16/2023 Scheduled remote reviewed. Normal device function.  HF diagnostics have been abnormal this monitoring period. Next remote 91 days. - CS, CVRS   Administration History     None          Latest Ref Rng & Units 05/07/2023    8:18 AM 07/31/2016    9:47 AM  PFT Results  FVC-Pre L 2.73  P 1.84   FVC-Predicted Pre % 55  P 41   FVC-Post L  2.32   FVC-Predicted Post %  52   Pre FEV1/FVC % % 77  P 77   Post FEV1/FCV % %  73   FEV1-Pre L 2.09  P 1.42   FEV1-Predicted Pre % 57  P 42   FEV1-Post L  1.69   DLCO uncorrected ml/min/mmHg 16.77  P 19.27   DLCO UNC% % 59  P 52   DLCO corrected ml/min/mmHg 16.77  P   DLCO COR %Predicted % 59  P   DLVA Predicted % 89  P 109   TLC L  5.76   TLC % Predicted %  75   RV % Predicted %  140  P Preliminary result    No results found for: "NITRICOXIDE"      Assessment & Plan:   COPD (chronic obstructive  pulmonary disease) (HCC) Appears stable.  PFTs are slightly improved.  Continue on current regimen with triple therapy maintenance inhaler activity as tolerated  Plan  Patient Instructions  Continue on TRELEGY daily , rinse after use Activity as tolerated.  Albuterol inhaler as needed  Follow up with Oncology as planned  Continue with surveillance CT scans.  Continue on CPAP at bedtime Do not drive if sleepy Work on [employer] .  Order for new CPAP machine    Follow up with Dr. Delton Coombes or Octavio Matheney NP In 6 months and As needed      Obstructive sleep apnea (adult) (pediatric) Excellent control compliance on nocturnal CPAP.  Continue current settings.  Order for new CPAP sent to DME  Plan  Patient Instructions  Continue on TRELEGY daily , rinse after use Activity as tolerated.  Albuterol inhaler as needed  Follow up with Oncology as planned  Continue with surveillance CT scans.  Continue on CPAP at bedtime Do not drive if sleepy Work on [employer] .  Order for new CPAP machine    Follow up with Dr. Delton Coombes or Kahli Fitzgerald NP In 6 months and As needed      Primary cancer of right upper lobe of lung (HCC) History of stage IIIa non-small cell lung cancer followed by oncology.  CT chest October 2024 showed stable changes.  Has an upcoming CT chest later this month and follow-up with oncology   Plan  Patient Instructions  Continue on TRELEGY daily , rinse after use Activity as tolerated.  Albuterol inhaler as needed  Follow up with Oncology as planned  Continue with surveillance CT scans.  Continue on CPAP at bedtime Do not drive if sleepy Work on [employer] .  Order for new CPAP machine    Follow up with Dr. Delton Coombes or Parv Manthey NP In 6 months and As needed      Chronic systolic heart failure (HCC) Appears euvolemic on exam.  Continue follow-up with cardiology     Rubye Oaks, NP 05/07/2023

## 2023-05-07 NOTE — Patient Instructions (Signed)
Spirometry/diffusion capacity.

## 2023-05-10 ENCOUNTER — Encounter: Payer: Self-pay | Admitting: Internal Medicine

## 2023-05-16 ENCOUNTER — Inpatient Hospital Stay: Payer: Medicare Other | Attending: Internal Medicine

## 2023-05-16 ENCOUNTER — Ambulatory Visit (HOSPITAL_COMMUNITY)
Admission: RE | Admit: 2023-05-16 | Discharge: 2023-05-16 | Disposition: A | Discharge status: Home / Self Care | Payer: Medicare Other | Source: Ambulatory Visit | Attending: Internal Medicine | Admitting: Internal Medicine

## 2023-05-16 DIAGNOSIS — N289 Disorder of kidney and ureter, unspecified: Secondary | ICD-10-CM | POA: Insufficient documentation

## 2023-05-16 DIAGNOSIS — Z9221 Personal history of antineoplastic chemotherapy: Secondary | ICD-10-CM | POA: Diagnosis not present

## 2023-05-16 DIAGNOSIS — I77810 Thoracic aortic ectasia: Secondary | ICD-10-CM | POA: Diagnosis not present

## 2023-05-16 DIAGNOSIS — C3411 Malignant neoplasm of upper lobe, right bronchus or lung: Secondary | ICD-10-CM | POA: Diagnosis not present

## 2023-05-16 DIAGNOSIS — Z923 Personal history of irradiation: Secondary | ICD-10-CM | POA: Insufficient documentation

## 2023-05-16 DIAGNOSIS — C3490 Malignant neoplasm of unspecified part of unspecified bronchus or lung: Secondary | ICD-10-CM | POA: Insufficient documentation

## 2023-05-16 DIAGNOSIS — C3491 Malignant neoplasm of unspecified part of right bronchus or lung: Secondary | ICD-10-CM | POA: Diagnosis not present

## 2023-05-16 DIAGNOSIS — I771 Stricture of artery: Secondary | ICD-10-CM | POA: Diagnosis not present

## 2023-05-16 LAB — CMP (CANCER CENTER ONLY)
ALT: 20 U/L (ref 0–44)
AST: 19 U/L (ref 15–41)
Albumin: 4.2 g/dL (ref 3.5–5.0)
Alkaline Phosphatase: 68 U/L (ref 38–126)
Anion gap: 7 (ref 5–15)
BUN: 46 mg/dL — ABNORMAL HIGH (ref 8–23)
CO2: 26 mmol/L (ref 22–32)
Calcium: 9.6 mg/dL (ref 8.9–10.3)
Chloride: 109 mmol/L (ref 98–111)
Creatinine: 2.9 mg/dL — ABNORMAL HIGH (ref 0.61–1.24)
GFR, Estimated: 22 mL/min — ABNORMAL LOW (ref >60)
Glucose, Bld: 90 mg/dL (ref 70–99)
Potassium: 4.5 mmol/L (ref 3.5–5.1)
Sodium: 142 mmol/L (ref 135–145)
Total Bilirubin: 0.3 mg/dL (ref 0.0–1.2)
Total Protein: 7.1 g/dL (ref 6.5–8.1)

## 2023-05-16 LAB — CBC WITH DIFFERENTIAL (CANCER CENTER ONLY)
Abs Immature Granulocytes: 0.02 10*3/uL (ref 0.00–0.07)
Basophils Absolute: 0 10*3/uL (ref 0.0–0.1)
Basophils Relative: 1 %
Eosinophils Absolute: 0.1 10*3/uL (ref 0.0–0.5)
Eosinophils Relative: 2 %
HCT: 38.8 % — ABNORMAL LOW (ref 39.0–52.0)
Hemoglobin: 12.3 g/dL — ABNORMAL LOW (ref 13.0–17.0)
Immature Granulocytes: 0 %
Lymphocytes Relative: 17 %
Lymphs Abs: 1 10*3/uL (ref 0.7–4.0)
MCH: 30 pg (ref 26.0–34.0)
MCHC: 31.7 g/dL (ref 30.0–36.0)
MCV: 94.6 fL (ref 80.0–100.0)
Monocytes Absolute: 0.9 10*3/uL (ref 0.1–1.0)
Monocytes Relative: 15 %
Neutro Abs: 3.6 10*3/uL (ref 1.7–7.7)
Neutrophils Relative %: 65 %
Platelet Count: 148 10*3/uL — ABNORMAL LOW (ref 150–400)
RBC: 4.1 MIL/uL — ABNORMAL LOW (ref 4.22–5.81)
RDW: 13.7 % (ref 11.5–15.5)
WBC Count: 5.6 10*3/uL (ref 4.0–10.5)
nRBC: 0 % (ref 0.0–0.2)

## 2023-05-21 ENCOUNTER — Ambulatory Visit: Payer: Medicare Other | Attending: Internal Medicine

## 2023-05-21 DIAGNOSIS — Z95810 Presence of automatic (implantable) cardiac defibrillator: Secondary | ICD-10-CM | POA: Diagnosis not present

## 2023-05-21 DIAGNOSIS — I5022 Chronic systolic (congestive) heart failure: Secondary | ICD-10-CM | POA: Diagnosis not present

## 2023-05-22 NOTE — Progress Notes (Signed)
EPIC Encounter for ICM Monitoring  Patient Name: Jason Russell is a 73 y.o. male Date: 05/22/2023 Primary Care Physican: Renaye Rakers, MD Primary Cardiologist: Tenny Craw Electrophysiologist: Ladona Ridgel 09/13/2022 Weight: 280 lbs 11/23/2022 Weight: 280 lbs 12/27/2022 Weight: 280 - 281 lbs 02/05/2023 Weight: Not weighing at home.  05/21/2022 Weight: 288 lbs    Spoke with patient and heart failure questions reviewed.  Transmission results reviewed.  Pt asymptomatic for fluid accumulation.  Reports feeling well at this time and voices no complaints.      Diet:  1/17 Advised to drink approximately 64 oz daily to maintain hydration.  He does cook at home.   CorVue Thoracic impedance suggesting normal fluid levels with the exception of possible fluid accumulation 1/16-1/20 and 1/27-2/2.   Prescribed:  Furosemide 40 mg take 1 tablet (40 mg total) by mouth daily with extra tablet (80 mg total) twice a week only.   Potassium 20 mEq 1 tablet daily. Spironolactone 25 mg take 1 tablet daily   Labs: 05/16/2023 Creatinine 2.90, BUN 46, Potassium 4.5, Sodium 142, GFR 22 02/12/2023 Creatinine 2.13, BUN 51, Potassium 4.3, Sodium 141, GFR 32 01/17/2023 Creatinine 2.07, BUN 42, Potassium 3.9, Sodium 144, GFR 34 12/05/2022 Creatinine 2.51, BUN 55, Potassium 4.5, Sodium 142, GFR 27 11/20/2022 Creatinine 2.09, BUN 43, Potassium 4.0, Sodium 144, GFR 33, BNP 4,310 A complete set of results can be found in results review   Recommendations:   Recommendation to limit salt intake to 2000 mg daily and fluid intake to 64 oz daily.  Encouraged to call if experiencing any fluid symptoms.    Follow-up plan: ICM clinic phone appointment on 06/25/2023.   91 day device clinic remote transmission 07/16/2023.     EP/Cardiology Office Visits:   Recall 05/19/2023 with Dr Tenny Craw (6 month).  Recall 11/18/2023 with Dr Ladona Ridgel.    Copy of ICM check sent to Dr. Ladona Ridgel.  3 month ICM trend: 05/21/2023.    12-14 Month ICM trend:      Karie Soda, RN 05/22/2023 3:21 PM

## 2023-05-23 ENCOUNTER — Inpatient Hospital Stay (HOSPITAL_BASED_OUTPATIENT_CLINIC_OR_DEPARTMENT_OTHER): Payer: Medicare Other | Admitting: Internal Medicine

## 2023-05-23 VITALS — BP 135/55 | HR 67 | Temp 97.0°F | Resp 17 | Ht 73.0 in | Wt 311.2 lb

## 2023-05-23 DIAGNOSIS — C3490 Malignant neoplasm of unspecified part of unspecified bronchus or lung: Secondary | ICD-10-CM | POA: Diagnosis not present

## 2023-05-23 DIAGNOSIS — Z9221 Personal history of antineoplastic chemotherapy: Secondary | ICD-10-CM | POA: Diagnosis not present

## 2023-05-23 DIAGNOSIS — C3411 Malignant neoplasm of upper lobe, right bronchus or lung: Secondary | ICD-10-CM | POA: Diagnosis not present

## 2023-05-23 DIAGNOSIS — Z923 Personal history of irradiation: Secondary | ICD-10-CM | POA: Diagnosis not present

## 2023-05-23 DIAGNOSIS — N289 Disorder of kidney and ureter, unspecified: Secondary | ICD-10-CM | POA: Diagnosis not present

## 2023-05-23 NOTE — Progress Notes (Signed)
Jason Russell [phone]   [phone]  OFFICE PROGRESS NOTE  Jason Rakers, MD [address] [place] [address]  DIAGNOSIS: Stage IIIA (T1a, N2, M0) non-small cell lung cancer, adenocarcinoma presented with right upper lobe lung nodule in addition to mediastinal lymphadenopathy diagnosed in March 2018.  Biomarker Findings Tumor Mutational Burden - TMB-Intermediate (8 Muts/Mb) Microsatellite Status - MS-Stable Genomic Findings For a complete list of the genes assayed, please refer to the Appendix. ERBB2 amplification - equivocal? DNMT3A K483fs*192 FUBP1 Q40* KEAP1 G360fs*68 TP53 C275F 7 Disease relevant genes with no reportable alterations: EGFR, KRAS, ALK, BRAF, MET, RET, ROS1   PRIOR THERAPY:  1) Course of concurrent chemoradiation with weekly carboplatin for AUC of 2 and paclitaxel 45 MG/M2. Status post 6 cycles. Last cycle was given 10/16/2016. 2)  Consolidation treatment with immunotherapy with Imfinzi (Durvalumab) 10 MG/M2 every 2 weeks. First dose 12/21/2016.  Status post 26 cycles.  CURRENT THERAPY: Observation.  INTERVAL HISTORY: Jason Russell 73 y.o. male returns to the clinic today for 69-month follow-up visit.Discussed the use of AI scribe software for clinical note transcription with the patient, who gave verbal consent to proceed.  History of Present Illness   Jason Russell is a 73 year old male with a history of stage IIIA (T1a, N2, M0) non-small cell lung cancer, adenocarcinoma presented with right upper lobe lung nodule in addition to mediastinal lymphadenopathy diagnosed in March 2018. who presents for follow-up after cancer treatment.  He has a history of stage IIIA (T1a, N2, M0) non-small cell lung cancer, adenocarcinoma presented with right upper lobe lung nodule in addition to mediastinal lymphadenopathy diagnosed in March 2018.[name] He underwent chemotherapy and radiation therapy, followed by  immunotherapy with  Imfinzi. His last treatment was in September 2019 September 2019, and he has been under surveillance since then.  A recent scan performed last week showed no evidence of metastatic disease. In the past six months, he has been doing well without any new symptoms or complaints. No chest pain, shortness of breath, cough, nausea, vomiting, diarrhea, or night sweats. He notes a weight gain during this period.        MEDICAL HISTORY: Past Medical History:  Diagnosis Date   Adenocarcinoma of right lung, stage 3 (HCC) 08/23/2016   Anemia    Arthritis    "hx right hip"   Asthma    "when I was a child"   Atrial fibrillation (HCC)    Amiodarone started 10/2011; Coumadin   Automatic implantable cardioverter-defibrillator in situ 10/03/2012   a. St. Jude ICD implantation 10/03/12.   Chronic anticoagulation    Chronic fatigue 10/18/2016   Chronic systolic heart failure (HCC)    a. Echo 7/13: EF 25%;  b. echo 04/2012:  Mild LVH, EF 30-35%, Gr 1 DD, mild AI, mild MR, mild LAE   COPD (chronic obstructive pulmonary disease) (HCC)    Dyslipidemia    Gout    History of blood transfusion 10/15/2013   "don't know where the blood's going; HgB down to 5"   Hyperlipidemia    Hypertension    Hypothyroidism    NICM (nonischemic cardiomyopathy) (HCC)    LHC 4/14:  minimal CAD   Obesity    OSA on CPAP    Tobacco abuse     ALLERGIES:  has no known allergies.  MEDICATIONS:  Current Outpatient Medications  Medication Sig Dispense Refill   albuterol (VENTOLIN HFA) 108 (90 Base) MCG/ACT inhaler Inhale 2 puffs  into the lungs every 4 (four) hours as needed for wheezing or shortness of breath. 8.5 g 5   amiodarone (PACERONE) 200 MG tablet TAKE 1 TABLET (200 MG TOTAL) BY MOUTH DAILY. TAKE DAILY EXCEPT SATURDAY AND SUNDAY 90 tablet 2   atorvastatin (LIPITOR) 40 MG tablet TAKE 1 TABLET BY MOUTH EVERY DAY 90 tablet 3   Bempedoic Acid-Ezetimibe (NEXLIZET) 180-10 MG TABS Take 1 tablet by mouth daily in the afternoon.  30 tablet 11   bisoprolol (ZEBETA) 5 MG tablet TAKE 1 TABLET (5 MG TOTAL) BY MOUTH DAILY. 90 tablet 3   [employer] D3 2000 units CAPS Take 2,000 Units by mouth daily.   11   dapagliflozin propanediol (FARXIGA) 10 MG TABS tablet Take 1 tablet (10 mg total) by mouth daily before breakfast. 90 tablet 3   ferrous gluconate (FERGON) 324 MG tablet TAKE 1 TABLET BY MOUTH THREE TIMES A DAY WITH MEALS 270 tablet 3   Fluticasone-Umeclidin-Vilant (TRELEGY ELLIPTA) 100-62.5-25 MCG/ACT AEPB INHALE 1 PUFF BY MOUTH EVERY DAY 180 each 3   furosemide (LASIX) 40 MG tablet Take one tablet (40 mg) by mouth daily with an extra tablet (80 mg total) twice a week only.     levothyroxine (SYNTHROID) 50 MCG tablet TAKE 1 TABLET BY MOUTH EVERY DAY BEFORE BREAKFAST 90 tablet 2   magnesium oxide (MAG-OX) 400 (241.3 Mg) MG tablet Take 1 tablet (400 mg total) by mouth daily. 30 tablet 0   Polyvinyl Alcohol-Povidone (REFRESH OP) Place 1 drop into both eyes every morning.     Potassium Chloride ER 20 MEQ TBCR TAKE 1 TABLET BY MOUTH EVERY DAY 90 tablet 2   sacubitril-valsartan (ENTRESTO) 97-103 MG Take 1 tablet by mouth 2 (two) times daily. 180 tablet 3   sildenafil (VIAGRA) 100 MG tablet TAKE 1 TABLET BY MOUTH EVERY DAY AS NEEDED FOR ERECTILE DYSFUNCTION 30 tablet 1   spironolactone (ALDACTONE) 25 MG tablet TAKE 1 TABLET (25 MG TOTAL) BY MOUTH DAILY. 90 tablet 2   ULORIC 40 MG tablet Take 1 tablet (40 mg total) by mouth daily. 30 tablet 0   XARELTO 20 MG TABS tablet TAKE 1 TABLET BY MOUTH EVERY DAY 90 tablet 1   No current facility-administered medications for this visit.    SURGICAL HISTORY:  Past Surgical History:  Procedure Laterality Date   CARDIAC DEFIBRILLATOR PLACEMENT  2014   CARDIOVERSION  2011   CARDIOVERSION N/A 03/15/2020   Procedure: CARDIOVERSION;  Surgeon: Pricilla Riffle, MD;  Location: [facility] ENDOSCOPY;  Service: Cardiovascular;  Laterality: N/A;   COLONOSCOPY WITH PROPOFOL Left 10/17/2013   Procedure: COLONOSCOPY  WITH PROPOFOL;  Surgeon: Louis Meckel, MD;  Location: [facility] ENDOSCOPY;  Service: Endoscopy;  Laterality: Left;   ESOPHAGOGASTRODUODENOSCOPY N/A 10/17/2013   Procedure: ESOPHAGOGASTRODUODENOSCOPY (EGD);  Surgeon: Louis Meckel, MD;  Location: [facility] ENDOSCOPY;  Service: Endoscopy;  Laterality: N/A;   ESOPHAGOGASTRODUODENOSCOPY (EGD) WITH PROPOFOL N/A 06/14/2018   Procedure: ESOPHAGOGASTRODUODENOSCOPY (EGD) WITH PROPOFOL;  Surgeon: Sherrilyn Rist, MD;  Location: [facility] ENDOSCOPY;  Service: Gastroenterology;  Laterality: N/A;   GIVENS CAPSULE STUDY N/A 10/29/2013   Procedure: GIVENS CAPSULE STUDY;  Surgeon: Louis Meckel, MD;  Location: [facility] ENDOSCOPY;  Service: Endoscopy;  Laterality: N/A;   ICD GENERATOR CHANGEOUT N/A 02/13/2018   Procedure: ICD GENERATOR CHANGEOUT;  Surgeon: Regan Lemming, MD;  Location: [facility] INVASIVE CV LAB;  Service: Cardiovascular;  Laterality: N/A;   IMPLANTABLE CARDIOVERTER DEFIBRILLATOR IMPLANT Left 10/03/2012   Procedure: IMPLANTABLE CARDIOVERTER DEFIBRILLATOR IMPLANT;  Surgeon:  Duke Salvia, MD;  Location: [facility] CATH LAB;  Service: Cardiovascular;  Laterality: Left;   IR FLUORO GUIDE PORT INSERTION RIGHT  09/21/2016   IR US GUIDE VASC ACCESS RIGHT  09/21/2016   JOINT REPLACEMENT     REVERSE SHOULDER ARTHROPLASTY Right 09/22/2020   Procedure: SHOULDER HEMI ARTHROPLASTY;  Surgeon: Bjorn Pippin, MD;  Location: [facility] ORS;  Service: Orthopedics;  Laterality: Right;   SAVORY DILATION N/A 06/14/2018   Procedure: SAVORY DILATION;  Surgeon: Sherrilyn Rist, MD;  Location: [facility] ENDOSCOPY;  Service: Gastroenterology;  Laterality: N/A;   TONSILLECTOMY  1950's   TOTAL HIP ARTHROPLASTY Right 11/25/1997   V TACH ABLATION N/A 09/15/2021   Procedure: V TACH ABLATION;  Surgeon: Marinus Maw, MD;  Location: [facility] INVASIVE CV LAB;  Service: Cardiovascular;  Laterality: N/A;   VIDEO BRONCHOSCOPY WITH ENDOBRONCHIAL ULTRASOUND N/A 08/09/2016   Procedure: VIDEO BRONCHOSCOPY WITH ENDOBRONCHIAL ULTRASOUND;   Surgeon: Roslynn Amble, MD;  Location: [facility] OR;  Service: Thoracic;  Laterality: N/A;    REVIEW OF SYSTEMS:  A comprehensive review of systems was negative.   PHYSICAL EXAMINATION: General appearance: alert, cooperative, and no distress Head: Normocephalic, without obvious abnormality, atraumatic Neck: no adenopathy, no JVD, supple, symmetrical, trachea midline, and thyroid not enlarged, symmetric, no tenderness/mass/nodules Lymph nodes: Cervical, supraclavicular, and axillary nodes normal. Resp: clear to auscultation bilaterally Back: symmetric, no curvature. ROM normal. No CVA tenderness. Cardio: regular rate and rhythm, S1, S2 normal, no murmur, click, rub or gallop GI: soft, non-tender; bowel sounds normal; no masses,  no organomegaly Extremities: extremities normal, atraumatic, no cyanosis or edema  ECOG PERFORMANCE STATUS: 1 - Symptomatic but completely ambulatory  Blood pressure (!) 135/55, pulse 67, temperature (!) 97 F (36.1 C), temperature source Temporal, resp. rate 17, height 6\' 1"  (1.854 m), weight (!) 311 lb 3.2 oz (141.2 kg), SpO2 100%.  LABORATORY DATA: Lab Results  Component Value Date   WBC 5.6 05/16/2023   HGB 12.3 (L) 05/16/2023   HCT 38.8 (L) 05/16/2023   MCV 94.6 05/16/2023   PLT 148 (L) 05/16/2023      Chemistry      Component Value Date/Time   NA 142 05/16/2023 1026   NA 144 01/17/2023 1419   NA 141 03/29/2017 1038   K 4.5 05/16/2023 1026   K 4.0 03/29/2017 1038   CL 109 05/16/2023 1026   CO2 26 05/16/2023 1026   CO2 27 03/29/2017 1038   BUN 46 (H) 05/16/2023 1026   BUN 42 (H) 01/17/2023 1419   BUN 28.6 (H) 03/29/2017 1038   CREATININE 2.90 (H) 05/16/2023 1026   CREATININE 1.6 (H) 03/29/2017 1038      Component Value Date/Time   CALCIUM 9.6 05/16/2023 1026   CALCIUM 9.3 03/29/2017 1038   ALKPHOS 68 05/16/2023 1026   ALKPHOS 105 03/29/2017 1038   AST 19 05/16/2023 1026   AST 18 03/29/2017 1038   ALT 20 05/16/2023 1026   ALT 13  03/29/2017 1038   BILITOT 0.3 05/16/2023 1026   BILITOT 0.62 03/29/2017 1038       RADIOGRAPHIC STUDIES: CT Chest Wo Contrast Result Date: 05/22/2023 CLINICAL DATA:  Restaging non-small cell lung cancer. * Tracking Code: BO * EXAM: CT CHEST WITHOUT CONTRAST TECHNIQUE: Multidetector CT imaging of the chest was performed following the standard protocol without IV contrast. RADIATION DOSE REDUCTION: This exam was performed according to the departmental dose-optimization program which includes automated exposure control, adjustment of the mA and/or kV according to patient  size and/or use of iterative reconstruction technique. COMPARISON:  Multiple prior CT scans.  The most recent is 11/15/2022 FINDINGS: Cardiovascular: The heart is normal in size. No pericardial effusion. Stable tortuosity, ectasia and calcification of the thoracic aorta. 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 Recommend annual imaging followup by CTA or MRA. This recommendation follows 2010 ACCF/AHA/AATS/ACR/ASA/SCA/SCAI/SIR/STS/SVM Guidelines for the Diagnosis and Management of Patients with Thoracic Aortic Disease. Circulation. 2010; 121: Z610-R604. Aortic aneurysm NOS (ICD10-I71.9). Stable coronary artery calcifications.  The pacer wires are stable. Mediastinum/Nodes: Stable scattered mediastinal and hilar lymph nodes but no mass or overt adenopathy. The esophagus is unremarkable. Lungs/Pleura: Stable radiation changes involving the right paramediastinal lung. No findings suspicious for recurrent tumor. No new pulmonary lesions or nodules to suggest metastatic disease. No pleural effusions or pleural nodules. Upper Abdomen: No significant upper abdominal findings. Stable renal calculi, renal cysts and vascular calcifications. Musculoskeletal: No chest wall mass, supraclavicular or axillary adenopathy. The bony thorax is intact. No worrisome lytic or sclerotic bone  lesions. Stable degenerative changes involving the spine. IMPRESSION: 1. Stable radiation changes involving the right paramediastinal lung. No findings suspicious for recurrent tumor. 2. No findings for metastatic disease involving the chest or upper abdomen. 3. 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 4. Stable coronary artery calcifications. Electronically Signed   By: Rudie Meyer M.D.   On: 05/22/2023 18:16     ASSESSMENT AND PLAN:  This is a very pleasant 73 years old African-American male with stage IIIA non-small cell lung cancer, adenocarcinoma.  The patient completed 6 weeks of concurrent chemoradiation with weekly carboplatin and paclitaxel and tolerated his treatment well except for odynophagia. The patient was then started on consolidation treatment with immunotherapy with Imfinzi (Durvalumab) status post 26 cycles.  The patient is currently on observation and he is feeling fine with no concerning complaints.    Non-Small Cell Lung Cancer (NSCLC) Reuven Braver, a 73 year old male, diagnosed with Stage IIIA (T1a, N2, M0) non-small cell lung cancer, adenocarcinoma presented with right upper lobe lung nodule in addition to mediastinal lymphadenopathy diagnosed in March 2018., underwent chemotherapy, radiotherapy, and immunotherapy with Imfinzi. Last treatment was in September 2019, and he has been in remission since. Recent imaging shows no evidence of metastatic disease, and he reports no new symptoms. Weight gain noted. Surveillance will continue based on stable imaging and absence of symptoms. - Follow-up scan in six months - Schedule next appointment in August 2025 - Transition to annual visits post next appointment.    For the renal insufficiency, he is followed by nephrology and he was advised to avoid any NSAIDs and discussed with his nephrologist for any additional recommendation. He was advised to call  immediately if he has any other concerning symptoms in the interval. The patient voices understanding of current disease status and treatment options and is in agreement with the current care plan. All questions were answered. The patient knows to call the clinic with any problems, questions or concerns. We can certainly see the patient much sooner if necessary. Disclaimer: This note was dictated with voice recognition software. Similar sounding words can inadvertently be transcribed and may not be corrected upon review.

## 2023-05-24 ENCOUNTER — Telehealth: Payer: Self-pay | Admitting: Internal Medicine

## 2023-05-24 NOTE — Telephone Encounter (Signed)
Patient is aware that another department will contact him to schedule scan appointment.

## 2023-05-29 NOTE — Progress Notes (Signed)
 Remote ICD transmission.

## 2023-06-06 ENCOUNTER — Other Ambulatory Visit: Payer: Self-pay | Admitting: Internal Medicine

## 2023-06-08 ENCOUNTER — Encounter: Payer: Self-pay | Admitting: Physician Assistant

## 2023-06-08 ENCOUNTER — Ambulatory Visit: Payer: Medicare Other | Attending: Physician Assistant | Admitting: Physician Assistant

## 2023-06-08 VITALS — BP 140/62 | HR 63 | Ht 73.0 in | Wt 307.0 lb

## 2023-06-08 DIAGNOSIS — E785 Hyperlipidemia, unspecified: Secondary | ICD-10-CM

## 2023-06-08 DIAGNOSIS — I428 Other cardiomyopathies: Secondary | ICD-10-CM | POA: Diagnosis not present

## 2023-06-08 DIAGNOSIS — Z79899 Other long term (current) drug therapy: Secondary | ICD-10-CM

## 2023-06-08 DIAGNOSIS — Z95810 Presence of automatic (implantable) cardiac defibrillator: Secondary | ICD-10-CM

## 2023-06-08 DIAGNOSIS — I429 Cardiomyopathy, unspecified: Secondary | ICD-10-CM | POA: Diagnosis not present

## 2023-06-08 DIAGNOSIS — I472 Ventricular tachycardia, unspecified: Secondary | ICD-10-CM

## 2023-06-08 DIAGNOSIS — I5022 Chronic systolic (congestive) heart failure: Secondary | ICD-10-CM | POA: Diagnosis not present

## 2023-06-08 DIAGNOSIS — I10 Essential (primary) hypertension: Secondary | ICD-10-CM | POA: Diagnosis not present

## 2023-06-08 NOTE — Patient Instructions (Signed)
 Medication Instructions:   Your physician recommends that you continue on your current medications as directed. Please refer to the Current Medication list given to you today.   *If you need a refill on your cardiac medications before your next appointment, please call your pharmacy*   Lab Work: NONE ORDERED  TODAY    If you have labs (blood work) drawn today and your tests are completely normal, you will receive your results only by: MyChart Message (if you have MyChart) OR A paper copy in the mail If you have any lab test that is abnormal or we need to change your treatment, we will call you to review the results.   Testing/Procedures: Your physician has requested that you have an echocardiogram. Echocardiography is a painless test that uses sound waves to create images of your heart. It provides your doctor with information about the size and shape of your heart and how well your heart's chambers and valves are working. This procedure takes approximately one hour. There are no restrictions for this procedure. Please do NOT wear cologne, perfume, aftershave, or lotions (deodorant is allowed). Please arrive 15 minutes prior to your appointment time.  Please note: We ask at that you not bring children with you during ultrasound (echo/ vascular) testing. Due to room size and safety concerns, children are not allowed in the ultrasound rooms during exams. Our front office staff cannot provide observation of children in our lobby area while testing is being conducted. An adult accompanying a patient to their appointment will only be allowed in the ultrasound room at the discretion of the ultrasound technician under special circumstances. We apologize for any inconvenience.    Follow-Up: At [facility], you and your health needs are our priority.  As part of our continuing mission to provide you with exceptional heart care, we have created designated Provider Care Teams.  These Care  Teams include your primary Cardiologist (physician) and Advanced Practice Providers (APPs -  Physician Assistants and Nurse Practitioners) who all work together to provide you with the care you need, when you need it.  We recommend signing up for the patient portal called "MyChart".  Sign up information is provided on this After Visit Summary.  MyChart is used to connect with patients for Virtual Visits (Telemedicine).  Patients are able to view lab/test results, encounter notes, upcoming appointments, etc.  Non-urgent messages can be sent to your provider as well.   To learn more about what you can do with MyChart, go to [email or web address].    Your next appointment:    6 month(s)   Provider:    Dietrich Pates, MD     Other Instructions

## 2023-06-08 NOTE — Progress Notes (Signed)
 Cardiology Office Note:  .   Date:  06/08/2023  ID:  Jason Russell, DOB 1951/02/29, MRN [id number] PCP: Renaye Rakers, MD  [facility] Providers Cardiologist:  Dietrich Pates, MD Electrophysiologist:  Lewayne Bunting, MD {  Jason Russell is a 73 y.o. male with a past medical history of ablation 09/2021, NICM seen for follow-up appointment.  History includes follow-up with Dr. Tenny Craw August 2024 and at that visit denied SOB, chest pain, palpitations.  Labs did show an elevated BNP.  Patient was seen a few days later by Dr. Ladona Ridgel.  Reportedly was doing well without any SOB.  Drinking a large tumbler filled with water throughout the day.  He thinks he was drinking way more than 64 ounces.  At that visit also denied chest pain, palpitations, dyspnea, PND, orthopnea, nausea, vomiting, dizziness, syncope, edema, weight gain, early satiety.  Today, he presents with a history of heart failure managed with an ICD and Lasix,  for a routine follow-up. He reports overall good health and denies any chest pain or shortness of breath. He has been adhering to the recommended fluid intake and has been taking Lasix 80mg  once a week. He has noticed a weight loss of 4 pounds since his last visit a month ago and denies any puffiness in his ankles or feet. He does not have a scale at home to monitor his weight daily. He denies any shocks with the ICD and any symptoms of atrial fibrillation. He is on Amiodarone 200mg  daily except for Saturdays and Sundays, which seems to be keeping things stable. He also takes Xarelto and denies any bleeding.  Reports no shortness of breath nor dyspnea on exertion. Reports no chest pain, pressure, or tightness. No edema, orthopnea, PND. Reports no palpitations.   Discussed the use of AI scribe software for clinical note transcription with the patient, who gave verbal consent to proceed.   ROS: Pertinent ROS in HPI  Studies Reviewed: .       Echo 05/24/2018 IMPRESSIONS      1. The left ventricle has moderately reduced systolic function, with an  ejection fraction of 35-40%. The cavity size was normal. There is mildly  increased left ventricular wall thickness. Left ventricular diastolic  Doppler parameters are consistent with   impaired relaxation.   2. The right ventricle has mildly reduced systolic function. The cavity  was small. There is no increase in right ventricular wall thickness.   3. The mitral valve is normal in structure. Mild thickening of the mitral  valve leaflet.   4. The tricuspid valve is normal in structure.   5. The aortic valve is normal in structure. Aortic valve regurgitation is  mild to moderate by color flow Doppler.   6. The interatrial septum was not assessed.   FINDINGS   Left Ventricle: The left ventricle has moderately reduced systolic  function, with an ejection fraction of 35-40%. The cavity size was normal.  There is mildly increased left ventricular wall thickness. Left  ventricular diastolic Doppler parameters are  consistent with impaired relaxation  Right Ventricle: The right ventricle has mildly reduced systolic function.  The cavity was small. There is no increase in right ventricular wall  thickness. Pacing wire/catheter visualized in the right ventricle.  Left Atrium: left atrial size was normal in size  Right Atrium: right atrial size was normal in size. Right atrial pressure  is estimated at 3 mmHg.  Interatrial Septum: The interatrial septum was not assessed.  Pericardium: There is no evidence  of pericardial effusion.  Mitral Valve: The mitral valve is normal in structure. Mild thickening of  the mitral valve leaflet. Mitral valve regurgitation is not visualized by  color flow Doppler.  Tricuspid Valve: The tricuspid valve is normal in structure. Tricuspid  valve regurgitation is mild by color flow Doppler.  Aortic Valve: The aortic valve is normal in structure. Aortic valve  regurgitation is mild to  moderate by color flow Doppler.  Pulmonic Valve: The pulmonic valve was grossly normal. Pulmonic valve  regurgitation is trivial by color flow Doppler.       Physical Exam:   VS:  BP (!) 140/62 (BP Location: Left Arm, Patient Position: Sitting, Cuff Size: Large)   Pulse 63   Ht 6\' 1"  (1.854 m)   Wt (!) 307 lb (139.3 kg)   SpO2 98%   BMI 40.50 kg/m    Wt Readings from Last 3 Encounters:  06/08/23 (!) 307 lb (139.3 kg)  05/23/23 (!) 311 lb 3.2 oz (141.2 kg)  11/23/22 (!) 306 lb 3.2 oz (138.9 kg)    GEN: Well nourished, well developed in no acute distress NECK: No JVD; No carotid bruits CARDIAC: RRR, no murmurs, rubs, gallops RESPIRATORY:  Clear to auscultation without rales, wheezing or rhonchi  ABDOMEN: Soft, non-tender, non-distended EXTREMITIES:  No edema; No deformity   ASSESSMENT AND PLAN: .   Congestive Heart Failure Stable with no symptoms of fluid overload. Weight has decreased by 4 pounds since last visit. No peripheral edema or shortness of breath. Lasix 80mg  once weekly and 40mg  daily on other days seems to be effective. -Continue current Lasix regimen. -Obtain a scale for daily weight monitoring to help differentiate fluid weight gain. -Check an echocardiogram given it has been 4 years since the last one.  Hypertension Systolic blood pressure slightly elevated today. -Check blood pressure at home every other day, an hour after taking medications. -Consider medication adjustment if systolic blood pressure consistently in the 140s.  Atrial Fibrillation/Hx of VT No symptoms of palpitations or racing heart. Stable on Amiodarone 200mg  daily except Saturdays and Sundays. -Continue current Amiodarone regimen.  Anticoagulation No bleeding with Xarelto. -Continue Xarelto as prescribed.  Follow-up Schedule an echocardiogram and follow-up appointment to review results.     Dispo: He can follow-up in 6 months with Dr. Tenny Craw  Signed, Sharlene Dory, PA-C

## 2023-06-25 ENCOUNTER — Ambulatory Visit: Payer: Medicare Other | Attending: Internal Medicine

## 2023-06-25 DIAGNOSIS — I5022 Chronic systolic (congestive) heart failure: Secondary | ICD-10-CM

## 2023-06-25 DIAGNOSIS — Z95810 Presence of automatic (implantable) cardiac defibrillator: Secondary | ICD-10-CM | POA: Diagnosis not present

## 2023-06-25 NOTE — Progress Notes (Signed)
 EPIC Encounter for ICM Monitoring  Patient Name: Jason Russell is a 73 y.o. male Date: 06/25/2023 Primary Care Physican: Renaye Rakers, MD Primary Cardiologist: Tenny Craw Electrophysiologist: Ladona Ridgel 09/13/2022 Weight: 280 lbs 11/23/2022 Weight: 280 lbs 12/27/2022 Weight: 280 - 281 lbs 02/05/2023 Weight: Not weighing at home.  05/21/2022 Weight: 288 lbs 06/25/2023 Weight:  288 lbs   Spoke with patient and heart failure questions reviewed.  Transmission results reviewed.  Pt asymptomatic for fluid accumulation.  Reports feeling well at this time and voices no complaints.     Diet:  Cooks at home most of the time and occasionally eats out a meal.   CorVue Thoracic impedance suggesting possible fluid accumulation starting 3/22 and trending back toward baseline.  Also possible fluid accumulation from 3/9-3/16.   Prescribed:  Furosemide 40 mg take 1 tablet (40 mg total) by mouth daily with extra tablet (80 mg total) twice a week only.   Potassium 20 mEq 1 tablet daily. Spironolactone 25 mg take 1 tablet daily   Labs: 05/16/2023 Creatinine 2.90, BUN 46, Potassium 4.5, Sodium 142, GFR 22 02/12/2023 Creatinine 2.13, BUN 51, Potassium 4.3, Sodium 141, GFR 32 01/17/2023 Creatinine 2.07, BUN 42, Potassium 3.9, Sodium 144, GFR 34 12/05/2022 Creatinine 2.51, BUN 55, Potassium 4.5, Sodium 142, GFR 27 11/20/2022 Creatinine 2.09, BUN 43, Potassium 4.0, Sodium 144, GFR 33, BNP 4,310 A complete set of results can be found in results review   Recommendations:   Advised to limit salt intake.  No changes and encouraged to call if experiencing any fluid symptoms.   Follow-up plan: ICM clinic phone appointment on 07/02/2023 to recheck fluid levels.   91 day device clinic remote transmission 07/16/2023.     EP/Cardiology Office Visits:   Recall 12/05/2023 with Dr Tenny Craw.  Recall 11/18/2023 with Dr Ladona Ridgel.    Copy of ICM check sent to Dr. Ladona Ridgel.  3 month ICM trend: 06/25/2023.    12-14 Month ICM trend:      Karie Soda, RN 06/25/2023 10:57 AM

## 2023-07-02 ENCOUNTER — Ambulatory Visit: Attending: Internal Medicine

## 2023-07-02 DIAGNOSIS — Z95810 Presence of automatic (implantable) cardiac defibrillator: Secondary | ICD-10-CM

## 2023-07-02 DIAGNOSIS — I5022 Chronic systolic (congestive) heart failure: Secondary | ICD-10-CM

## 2023-07-02 NOTE — Progress Notes (Signed)
 EPIC Encounter for ICM Monitoring  Patient Name: Jason Russell is a 73 y.o. male Date: 07/02/2023 Primary Care Physican: Renaye Rakers, MD Primary Cardiologist: Tenny Craw Electrophysiologist: Ladona Ridgel 09/13/2022 Weight: 280 lbs 11/23/2022 Weight: 280 lbs 12/27/2022 Weight: 280 - 281 lbs 02/05/2023 Weight: Not weighing at home.  05/21/2022 Weight: 288 lbs 06/25/2023 Weight:  288 lbs   Spoke with patient and heart failure questions reviewed.  Transmission results reviewed.  Pt asymptomatic for fluid accumulation.  He may be drinking more than the recommended 64 oz fluid daily.    Diet:  Cooks at home most of the time and occasionally eats out a meal.     CorVue Thoracic impedance suggesting fluid levels returned close to normal.  Also possible fluid accumulation from 3/9-3/16.   Prescribed:  Furosemide 40 mg take 1 tablet (40 mg total) by mouth daily with extra tablet (80 mg total) twice a week only.   Potassium 20 mEq 1 tablet daily. Spironolactone 25 mg take 1 tablet daily   Labs: 05/16/2023 Creatinine 2.90, BUN 46, Potassium 4.5, Sodium 142, GFR 22 02/12/2023 Creatinine 2.13, BUN 51, Potassium 4.3, Sodium 141, GFR 32 01/17/2023 Creatinine 2.07, BUN 42, Potassium 3.9, Sodium 144, GFR 34 12/05/2022 Creatinine 2.51, BUN 55, Potassium 4.5, Sodium 142, GFR 27 11/20/2022 Creatinine 2.09, BUN 43, Potassium 4.0, Sodium 144, GFR 33, BNP 4,310 A complete set of results can be found in results review   Recommendations:   Advised to limit fluid intake.  No changes and encouraged to call if experiencing any fluid symptoms.   Follow-up plan: ICM clinic phone appointment on 07/30/2023.   91 day device clinic remote transmission 07/16/2023.     EP/Cardiology Office Visits:   Recall 12/05/2023 with Dr Tenny Craw.  Recall 11/18/2023 with Dr Ladona Ridgel.    Copy of ICM check sent to Dr. Ladona Ridgel.  3 month ICM trend: 07/02/2023.    12-14 Month ICM trend:     Karie Soda, RN 07/02/2023 2:37 PM

## 2023-07-03 ENCOUNTER — Ambulatory Visit (HOSPITAL_COMMUNITY): Attending: Physician Assistant

## 2023-07-03 ENCOUNTER — Encounter: Payer: Self-pay | Admitting: Internal Medicine

## 2023-07-03 DIAGNOSIS — I5022 Chronic systolic (congestive) heart failure: Secondary | ICD-10-CM | POA: Diagnosis not present

## 2023-07-03 LAB — ECHOCARDIOGRAM COMPLETE
Area-P 1/2: 1.76 cm2
P 1/2 time: 538 ms
S' Lateral: 5.1 cm

## 2023-07-09 ENCOUNTER — Encounter: Payer: Self-pay | Admitting: Physician Assistant

## 2023-07-09 DIAGNOSIS — I77819 Aortic ectasia, unspecified site: Secondary | ICD-10-CM

## 2023-07-09 NOTE — Telephone Encounter (Signed)
 I spoke with patient and reviewed results with him.  [facility] referral placed.

## 2023-07-10 ENCOUNTER — Telehealth: Payer: Self-pay | Admitting: *Deleted

## 2023-07-10 DIAGNOSIS — I77819 Aortic ectasia, unspecified site: Secondary | ICD-10-CM

## 2023-07-10 NOTE — Telephone Encounter (Signed)
-----   Message -----  From: Ludwig Clarks, RN  Sent: 07/09/2023   4:59 PM EDT  To: Dossie Arbour, RN; Sharlene Dory, PA-C  Subject: referral                                       Hey,   I am going to go ahead and schedule the patient to be seen by a PA 5/7. Can you please order a CTA chest for an accurate measurement? He has a CT chest in Feb with the TAA being 4.0 and now ECHO stating it's 50. If the scan is unable to be done prior to 5/7 we will need to reschedule his consult.   Thanks,  Allyssa, RN    Order for CT ANGIO CHEST AORTA placed in the system as well as a future BMET.  Will send scheduling dept a message to go ahead and schedule this for the pt to have done asap, so that this can be available at his consult appt with [facility] in May.  Will update the pt as well on additional testing needed before he see's Thoracic Surgery.

## 2023-07-10 NOTE — Telephone Encounter (Signed)
-----   Message from Sharlene Dory sent at 07/09/2023  3:50 PM EDT ----- Jason Russell, Your heart pump function remains moderately decreased at 35 to 40%.  This is the same as your previous echocardiogram.  You have some impaired relaxation of the heart.  Left upper chamber was moderately enlarged and right upper chamber was mildly enlarged.  He had a moderate leak of your mitral valve.  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  We need to refer you to cardiothoracic surgery for closer monitoring.  Triage, please contact [facility] Bump and put in a consult for CT surgery.   Please be extremely mindful of your blood pressure and do not bear down during a bowel movement.  Do not lift anything heavy.  Sharlene Dory, PA-C

## 2023-07-11 ENCOUNTER — Encounter: Payer: Self-pay | Admitting: Internal Medicine

## 2023-07-16 ENCOUNTER — Ambulatory Visit (INDEPENDENT_AMBULATORY_CARE_PROVIDER_SITE_OTHER): Payer: Medicare Other

## 2023-07-16 DIAGNOSIS — I428 Other cardiomyopathies: Secondary | ICD-10-CM | POA: Diagnosis not present

## 2023-07-16 DIAGNOSIS — I5022 Chronic systolic (congestive) heart failure: Secondary | ICD-10-CM

## 2023-07-17 ENCOUNTER — Encounter: Payer: Self-pay | Admitting: Internal Medicine

## 2023-07-17 LAB — CUP PACEART REMOTE DEVICE CHECK
Battery Remaining Longevity: 57 mo
Battery Remaining Percentage: 52 %
Battery Voltage: 2.96 V
Brady Statistic RV Percent Paced: 1 %
Date Time Interrogation Session: 20250414020017
HighPow Impedance: 43 Ohm
HighPow Impedance: 43 Ohm
Implantable Lead Connection Status: 753985
Implantable Lead Implant Date: 20140703
Implantable Lead Location: 753860
Implantable Pulse Generator Implant Date: 20191113
Lead Channel Impedance Value: 410 Ohm
Lead Channel Pacing Threshold Amplitude: 0.75 V
Lead Channel Pacing Threshold Pulse Width: 0.5 ms
Lead Channel Sensing Intrinsic Amplitude: 4.7 mV
Lead Channel Setting Pacing Amplitude: 2.5 V
Lead Channel Setting Pacing Pulse Width: 0.5 ms
Lead Channel Setting Sensing Sensitivity: 0.5 mV
Pulse Gen Serial Number: 9820959
Zone Setting Status: 755011

## 2023-07-18 ENCOUNTER — Ambulatory Visit (HOSPITAL_COMMUNITY)
Admission: RE | Admit: 2023-07-18 | Discharge: 2023-07-18 | Disposition: A | Discharge status: Home / Self Care | Source: Ambulatory Visit | Attending: Physician Assistant | Admitting: Physician Assistant

## 2023-07-18 DIAGNOSIS — I77819 Aortic ectasia, unspecified site: Secondary | ICD-10-CM | POA: Diagnosis not present

## 2023-07-18 DIAGNOSIS — I7121 Aneurysm of the ascending aorta, without rupture: Secondary | ICD-10-CM | POA: Diagnosis not present

## 2023-07-18 DIAGNOSIS — I517 Cardiomegaly: Secondary | ICD-10-CM | POA: Diagnosis not present

## 2023-07-18 DIAGNOSIS — J929 Pleural plaque without asbestos: Secondary | ICD-10-CM | POA: Diagnosis not present

## 2023-07-18 MED ORDER — IOHEXOL 350 MG/ML SOLN
75.0000 mL | Freq: Once | INTRAVENOUS | Status: AC | PRN
  Administered 2023-07-18: 75 mL via INTRAVENOUS

## 2023-07-19 LAB — BASIC METABOLIC PANEL WITH GFR
BUN/Creatinine Ratio: 17 (ref 10–24)
BUN: 41 mg/dL — ABNORMAL HIGH (ref 8–27)
CO2: 21 mmol/L (ref 20–29)
Calcium: 9.2 mg/dL (ref 8.6–10.2)
Chloride: 106 mmol/L (ref 96–106)
Creatinine, Ser: 2.46 mg/dL — ABNORMAL HIGH (ref 0.76–1.27)
Glucose: 63 mg/dL — ABNORMAL LOW (ref 70–99)
Potassium: 4.3 mmol/L (ref 3.5–5.2)
Sodium: 141 mmol/L (ref 134–144)
eGFR: 27 mL/min/{1.73_m2} — ABNORMAL LOW (ref >59)

## 2023-07-25 ENCOUNTER — Encounter: Payer: Self-pay | Admitting: Physician Assistant

## 2023-07-25 DIAGNOSIS — I7121 Aneurysm of the ascending aorta, without rupture: Secondary | ICD-10-CM | POA: Insufficient documentation

## 2023-07-25 HISTORY — DX: Aneurysm of the ascending aorta, without rupture: I71.21

## 2023-07-26 ENCOUNTER — Telehealth: Payer: Self-pay | Admitting: *Deleted

## 2023-07-26 DIAGNOSIS — I7121 Aneurysm of the ascending aorta, without rupture: Secondary | ICD-10-CM

## 2023-07-26 DIAGNOSIS — I77819 Aortic ectasia, unspecified site: Secondary | ICD-10-CM

## 2023-07-26 DIAGNOSIS — R911 Solitary pulmonary nodule: Secondary | ICD-10-CM

## 2023-07-26 NOTE — Addendum Note (Signed)
 Addended by: Peggi Bowels on: 07/26/2023 09:00 AM   Modules accepted: Orders

## 2023-07-26 NOTE — Telephone Encounter (Signed)
 Returned another call back to the pt and informed him that Dr. Marguerita Shih reviewed his CT ANGIO AORTA results that we sent to him, and he suggested that we order for the pt to also have a regular CHEST CT W/WO Contrast done in 3-6 months for surveillance of new finding of 7 mm LUL nodule.   Pt aware we will place this order as well and have our scheduling dept reach out to him to have this test scheduled to be done in 3-6 months.   Pt verbalized understanding and agrees with this plan.   Message below copied from secure chat with Marlyse Single PA-C: Yes, thank you. He can get a noncontrast CT in Oct. I would plan the repeat chest aorta CTA 1 year after that (Oct 2026) to cut down on radiation. The aneurysm is not that big so it is fine. Plus the noncontrast CT will give some size as well.    1.)  Order for Chest CT WO Contrast placed for pt to have done in Oct 2025  2.)  Order for CHEST CT ANGIO AORTA placed for the pt to have done in Oct 2026 with a BMET (released)  Pt made aware of both test recommendations and that he will be called accordingly by our CT Scheduling team, to arrange these appts.   Pt agreed to plan.

## 2023-07-26 NOTE — Telephone Encounter (Signed)
 Marlene Simas, MD to Marlyse Single T, PA-C     07/25/23  9:37 PM Hi Scott Repeat CT scan of the chest in 3-6 months would be reasonable for his condition.  Thank you Sherwood Donath to Marlene Simas, MD     07/25/23  5:20 PM Hi Dr. Marguerita Shih He had a CT done to evaluate thoracic aortic aneurysm and the radiologist noted a new 7 mm LUL nodule. With his hx of malignancy, I wanted to let you know so you can review it. Thank you, Geralyn Knee

## 2023-07-26 NOTE — Telephone Encounter (Signed)
-----   Message from Marlyse Single sent at 07/25/2023  5:19 PM EDT ----- Reviewed for Lovette Rud, PA-C while she is out of the office. 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  PLAN:  -Repeat chest aorta CTA in 1 year (Dx ascending thoracic aortic aneurysm) -I will fwd to Dr. Marguerita Shih (his oncologist) to review CT given new nodule Marlyse Single, PA-C    07/25/2023 5:10 PM

## 2023-07-26 NOTE — Telephone Encounter (Signed)
 RE: CHEST CT IN OCTOBER 2025 AND CHEST CT ANGIO AORTA IN OCTOBER 2026 Received: Today Roland Cleveland, LPN Both Scheduled January 02 2024 and 2026

## 2023-07-26 NOTE — Telephone Encounter (Signed)
 The patient has been notified of the result and verbalized understanding.  All questions (if any) were answered.  Pt aware we will order for him to get a repeat CT ANGIO CHEST AORTA to be done in one year for surveillance.    Pt aware I will place the order and send a staff message to our scheduling team to call him back closer to that time frame to arrange this appt.  Will place future BMET in the system for that time as well, and release this.   Pt verbalized understanding and agrees with this plan.

## 2023-07-30 ENCOUNTER — Ambulatory Visit: Attending: Internal Medicine

## 2023-07-30 DIAGNOSIS — Z95810 Presence of automatic (implantable) cardiac defibrillator: Secondary | ICD-10-CM | POA: Diagnosis not present

## 2023-07-30 DIAGNOSIS — I5022 Chronic systolic (congestive) heart failure: Secondary | ICD-10-CM | POA: Diagnosis not present

## 2023-08-01 NOTE — Progress Notes (Signed)
 EPIC Encounter for ICM Monitoring  Patient Name: Jason Russell is a 73 y.o. male Date: 08/01/2023 Primary Care Physican: Jonathon Neighbors, MD Primary Cardiologist: Avanell Bob Electrophysiologist: Carolynne Citron 09/13/2022 Weight: 280 lbs 11/23/2022 Weight: 280 lbs 12/27/2022 Weight: 280 - 281 lbs 02/05/2023 Weight: Not weighing at home.  05/21/2022 Weight: 288 lbs 06/25/2023 Weight: 288 lbs 08/01/2023 Weight: 290 lbs   Spoke with patient and heart failure questions reviewed.  Transmission results reviewed.  Pt asymptomatic for fluid accumulation.     Diet:  Cooks at home most of the time and occasionally eats out a meal.     CorVue Thoracic impedance suggesting possible fluid accumulation from 4/21-4/27 and returned to baseline 4/28.   Prescribed:  Furosemide  40 mg take 1 tablet (40 mg total) by mouth daily with extra tablet (80 mg total) twice a week only.   Potassium 20 mEq 1 tablet daily. Spironolactone  25 mg take 1 tablet daily   Labs: 07/18/2023 Creatinine 1.46, BUN 41, Potassium 4.3, Sodium 141, GFR 27 05/16/2023 Creatinine 2.90, BUN 46, Potassium 4.5, Sodium 142, GFR 22 02/12/2023 Creatinine 2.13, BUN 51, Potassium 4.3, Sodium 141, GFR 32 01/17/2023 Creatinine 2.07, BUN 42, Potassium 3.9, Sodium 144, GFR 34 12/05/2022 Creatinine 2.51, BUN 55, Potassium 4.5, Sodium 142, GFR 27 11/20/2022 Creatinine 2.09, BUN 43, Potassium 4.0, Sodium 144, GFR 33, BNP 4,310 A complete set of results can be found in results review   Recommendations:    No changes and encouraged to call if experiencing any fluid symptoms.   Follow-up plan: ICM clinic phone appointment on 09/03/2023.   91 day device clinic remote transmission 10/15/2023.     EP/Cardiology Office Visits:   Recall 12/05/2023 with Dr Avanell Bob.  Recall 11/18/2023 with Dr Carolynne Citron.    Copy of ICM check sent to Dr. Carolynne Citron.  3 month ICM trend: 07/30/2023.    12-14 Month ICM trend:     Almyra Jain, RN 08/01/2023 5:06 PM

## 2023-08-08 ENCOUNTER — Ambulatory Visit: Attending: Thoracic Surgery (Cardiothoracic Vascular Surgery) | Admitting: Surgical

## 2023-08-08 VITALS — BP 119/66 | HR 72 | Resp 20 | Ht 73.0 in | Wt 290.0 lb

## 2023-08-08 DIAGNOSIS — I7121 Aneurysm of the ascending aorta, without rupture: Secondary | ICD-10-CM | POA: Insufficient documentation

## 2023-08-08 NOTE — Patient Instructions (Signed)
 Stressed the importance of good blood pressure control and lifestyle modifications as able in regards to healthy choices with nutrition, etc.

## 2023-08-08 NOTE — Progress Notes (Signed)
 [address]       [place] [address]             [phone]        Jason Russell [id number] 1950/05/21   History of Present Illness: The patient is a 73 year old male with recently diagnosed 4.1 cm ascending thoracic aortic aneurysm.  He does have significant cardiac history including congestive heart failure, history of atrial fibrillation and ventricular tachycardia and is status post ICD.  He also has hypertension.  He is on chronic anticoagulation.  He has hypercholesterolemia.  He also has obstructive sleep apnea.  He does have some chronic mild shortness of breath and occasional palpitations.  He also has a history of lung cancer of the right upper lobe stage IIIa (T1a, N2, M0 ).  He has undergone chemoradiation and immunotherapy.  These treatments were done in 2018.  He is followed by Dr. Liam Redhead.  He denies any recent chest pain.  On echocardiogram he does have reduced EF of 35% and moderate aortic insufficiency.  The aortic valve is tricuspid.     Current Outpatient Medications on File Prior to Visit  Medication Sig Dispense Refill   albuterol  (VENTOLIN  HFA) 108 (90 Base) MCG/ACT inhaler Inhale 2 puffs into the lungs every 4 (four) hours as needed for wheezing or shortness of breath. 8.5 g 5   amiodarone  (PACERONE ) 200 MG tablet TAKE 1 TABLET (200 MG TOTAL) BY MOUTH DAILY. TAKE DAILY EXCEPT SATURDAY AND SUNDAY 90 tablet 2   atorvastatin  (LIPITOR) 40 MG tablet TAKE 1 TABLET BY MOUTH EVERY DAY 90 tablet 3   Bempedoic Acid-Ezetimibe (NEXLIZET ) 180-10 MG TABS Take 1 tablet by mouth daily in the afternoon. 30 tablet 11   bisoprolol  (ZEBETA ) 5 MG tablet TAKE 1 TABLET (5 MG TOTAL) BY MOUTH DAILY. 90 tablet 3   [employer] D3 2000 units CAPS Take 2,000 Units by mouth daily.   11   dapagliflozin  propanediol (FARXIGA ) 10 MG TABS tablet Take 1 tablet (10 mg total) by mouth daily before breakfast. 90 tablet 3   ferrous gluconate  (FERGON) 324 MG tablet TAKE 1 TABLET BY  MOUTH THREE TIMES A DAY WITH MEALS 270 tablet 3   Fluticasone -Umeclidin-Vilant (TRELEGY ELLIPTA ) 100-62.5-25 MCG/ACT AEPB INHALE 1 PUFF BY MOUTH EVERY DAY 180 each 3   furosemide  (LASIX ) 40 MG tablet Take one tablet (40 mg) by mouth daily with an extra tablet (80 mg total) twice a week only.     JARDIANCE 10 MG TABS tablet Take 10 mg by mouth daily.     levothyroxine  (SYNTHROID ) 50 MCG tablet TAKE 1 TABLET BY MOUTH EVERY DAY BEFORE BREAKFAST 90 tablet 2   magnesium  oxide (MAG-OX) 400 (241.3 Mg) MG tablet Take 1 tablet (400 mg total) by mouth daily. 30 tablet 0   Polyvinyl Alcohol -Povidone (REFRESH OP) Place 1 drop into both eyes every morning.     Potassium Chloride  ER 20 MEQ TBCR TAKE 1 TABLET BY MOUTH EVERY DAY 90 tablet 2   sacubitril -valsartan  (ENTRESTO ) 97-103 MG Take 1 tablet by mouth 2 (two) times daily. 180 tablet 3   sildenafil  (VIAGRA ) 100 MG tablet TAKE 1 TABLET BY MOUTH EVERY DAY AS NEEDED FOR ERECTILE DYSFUNCTION 30 tablet 1   spironolactone  (ALDACTONE ) 25 MG tablet TAKE 1 TABLET (25 MG TOTAL) BY MOUTH DAILY. 90 tablet 2   ULORIC  40 MG tablet Take 1 tablet (40 mg total) by mouth daily. 30 tablet 0   XARELTO  20 MG TABS tablet TAKE  1 TABLET BY MOUTH EVERY DAY 90 tablet 1   No current facility-administered medications on file prior to visit.     BP 119/66   Pulse 72   Resp 20   Ht 6\' 1"  (1.854 m)   Wt 290 lb (131.5 kg)   SpO2 96% Comment: RA  BMI 38.26 kg/m   Physical Exam Constitutional:      General: He is not in acute distress.    Appearance: Normal appearance. He is obese.  Eyes:     General: No scleral icterus.    Conjunctiva/sclera: Conjunctivae normal.  Neck:     Vascular: No carotid bruit.  Cardiovascular:     Rate and Rhythm: Rhythm irregular.     Heart sounds: Murmur heard.  Abdominal:     Palpations: Abdomen is soft.     Tenderness: There is no abdominal tenderness.  Musculoskeletal:        General: Swelling present.     Right lower leg: Edema present.      Left lower leg: Edema present.  Skin:    Capillary Refill: Capillary refill takes less than 2 seconds.     Coloration: Skin is not jaundiced or pale.  Neurological:     Mental Status: He is alert and oriented to person, place, and time.  Psychiatric:        Thought Content: Thought content normal.     CTA Results: Narrative & Impression  CLINICAL DATA:  Aortic dilatation   EXAM: CT ANGIOGRAPHY CHEST WITH CONTRAST   TECHNIQUE: Multidetector CT imaging of the chest was performed using the standard protocol during bolus administration of intravenous contrast. Multiplanar CT image reconstructions and MIPs were obtained to evaluate the vascular anatomy. Multiplanar image (3D post-processing) reconstructions and MIPs were obtained to evaluate the vascular anatomy.   RADIATION DOSE REDUCTION: This exam was performed according to the departmental dose-optimization program which includes automated exposure control, adjustment of the mA and/or kV according to patient size and/or use of iterative reconstruction technique.   CONTRAST:  75mL OMNIPAQUE  IOHEXOL  350 MG/ML SOLN   COMPARISON:  Chest CT performed May 16, 2023   FINDINGS: Cardiovascular: Enlarged heart. An implant is present overlying the left chest with leads terminating in the low right ventricle.   Coronary sinus: 45 mm   Tubular ascending thoracic aorta: 41 mm   Aortic arch: 39 mm   Proximal descending thoracic aorta: 41 mm   Descending thoracic aorta at the level of the aortic hiatus: 30 mm   Main pulmonary artery: Within normal limits.   Mediastinum/Nodes: Nothing significant.   Lungs/Pleura: There is pleural thickening/scarring along the medial aspect of the right hemithorax, unchanged from prior. A 7 mm noncalcified pulmonary nodule is present in the left upper lobe on image 30 of series 8.   Upper Abdomen: Right-sided nephrolithiasis.   Musculoskeletal: Not significantly changed.   Review  of the MIP images confirms the above findings.   IMPRESSION: 1. Aneurysm of the tubular ascending thoracic aorta measuring 4.1 cm. 2. Recommend annual imaging followup by CTA or MRA. This recommendation follows 2010 ACCF/AHA/AATS/ACR/ASA/SCA/SCAI/SIR/STS/SVM Guidelines for the Diagnosis and Management of Patients with Thoracic Aortic Disease. Circulation. 2010; 121: R604-V409. Aortic aneurysm NOS (ICD10-I71.9) 3. A new noncalcified pulmonary nodule is present in the left upper lobe measuring 7 mm. This was not seen on prior imaging. This is too small for CT-guided biopsy. This also warrants pulmonary/oncology follow-up given prior oncologic history.     Electronically Signed   By: Donata Fryer  Simon M.D.   On: 07/25/2023 11:02          ECHOCARDIOGRAM REPORT       Patient Name:   Jason Russell Date of Exam: 07/03/2023  Medical Rec #:  [id number]        Height:       73.0 in  Accession #:    [id number]       Weight:       307.0 lb  Date of Birth:  26-Jun-1950        BSA:          2.581 m  Patient Age:    72 years         BP:           140/62 mmHg  Patient Gender: M                HR:           58 bpm.  Exam Location:  [address]   Procedure: 2D Echo, Cardiac Doppler and Color Doppler (Both Spectral and  Color            Flow Doppler were utilized during procedure).   Indications:    I50.22 CHF    History:        Patient has prior history of Echocardiogram examinations,  most                 recent 05/24/2018. CHF and NICM, Defibrillator,  Arrythmias:Atrial                 Fibrillation and H/o Ventricular tachycardia; Risk                  Factors:Hypertension and Dyslipidemia.    Sonographer:    Lula Sale RDCS  Referring Phys: [phone] TESSA N CONTE     Sonographer Comments: Patient is obese. Image acquisition challenging due  to patient body habitus.  IMPRESSIONS     1. Left ventricular ejection fraction, by estimation, is 35 to 40%. The  left ventricle has  moderately decreased function. The left ventricle  demonstrates global hypokinesis. The left ventricular internal cavity size  was moderately to severely dilated.  There is mild eccentric left ventricular hypertrophy. Left ventricular  diastolic parameters are consistent with Grade I diastolic dysfunction  (impaired relaxation).   2. Right ventricular systolic function is normal. The right ventricular  size is normal. Tricuspid regurgitation signal is inadequate for assessing  PA pressure.   3. Left atrial size was moderately dilated.   4. Right atrial size was mildly dilated.   5. The mitral valve is normal in structure. Moderate mitral valve  regurgitation.   6. The aortic valve is tricuspid. Aortic valve regurgitation is moderate.   7. Aortic dilatation noted. There is severe dilatation of the aortic  root, measuring 50 mm. There is mild dilatation of the ascending aorta,  measuring 40 mm.   8. The inferior vena cava is normal in size with greater than 50%  respiratory variability, suggesting right atrial pressure of 3 mmHg.   Comparison(s): Prior images reviewed side by side. The left ventricular  function is unchanged. The aortic root is more dilated.   FINDINGS   Left Ventricle: Left ventricular ejection fraction, by estimation, is 35  to 40%. The left ventricle has moderately decreased function. The left  ventricle demonstrates global hypokinesis. The left ventricular internal  cavity size was moderately to  severely dilated. There is mild eccentric left ventricular hypertrophy.  Abnormal (paradoxical) septal motion, consistent with left bundle branch  block. Left ventricular diastolic parameters are consistent with Grade I  diastolic dysfunction (impaired  relaxation). Normal left ventricular filling pressure.   Right Ventricle: The right ventricular size is normal. No increase in  right ventricular wall thickness. Right ventricular systolic function is  normal. Tricuspid  regurgitation signal is inadequate for assessing PA  pressure. The tricuspid regurgitant  velocity is 2.20 m/s, and with an assumed right atrial pressure of 3 mmHg,  the estimated right ventricular systolic pressure is 22.4 mmHg.   Left Atrium: Left atrial size was moderately dilated.   Right Atrium: Right atrial size was mildly dilated.   Pericardium: There is no evidence of pericardial effusion.   Mitral Valve: The mitral valve is normal in structure. Moderate mitral  valve regurgitation, with anteriorly-directed jet.   Tricuspid Valve: The tricuspid valve is normal in structure. Tricuspid  valve regurgitation is trivial.   Aortic Valve: The aortic valve is tricuspid. Aortic valve regurgitation is  moderate. Aortic regurgitation PHT measures 538 msec.   Pulmonic Valve: The pulmonic valve was grossly normal. Pulmonic valve  regurgitation is trivial. No evidence of pulmonic stenosis.   Aorta: Aortic dilatation noted. There is severe dilatation of the aortic  root, measuring 50 mm. There is mild dilatation of the ascending aorta,  measuring 40 mm.   Venous: The inferior vena cava is normal in size with greater than 50%  respiratory variability, suggesting right atrial pressure of 3 mmHg.   IAS/Shunts: No atrial level shunt detected by color flow Doppler.   Additional Comments: A device lead is visualized in the right ventricle  and right atrium.     LEFT VENTRICLE  PLAX 2D  LVIDd:         6.90 cm   Diastology  LVIDs:         5.10 cm   LV e' medial:    4.75 cm/s  LV PW:         1.20 cm   LV E/e' medial:  11.6  LV IVS:        1.20 cm   LV e' lateral:   3.81 cm/s  LVOT diam:     2.50 cm   LV E/e' lateral: 14.5  LV SV:         104  LV SV Index:   40  LVOT Area:     4.91 cm     RIGHT VENTRICLE             IVC  RV S prime:     12.90 cm/s  IVC diam: 1.10 cm  TAPSE (M-mode): 2.5 cm  RVSP:           22.4 mmHg   LEFT ATRIUM              Index        RIGHT ATRIUM            Index  LA diam:        4.10 cm  1.59 cm/m   RA Pressure: 3.00 mmHg  LA Vol (A2C):   73.7 ml  28.55 ml/m  RA Area:     21.50 cm  LA Vol (A4C):   124.0 ml 48.03 ml/m  RA Volume:   66.90 ml  25.92 ml/m  LA Biplane Vol: 104.0 ml 40.29 ml/m   AORTIC VALVE  LVOT Vmax:   89.24 cm/s  LVOT Vmean:  57.620 cm/s  LVOT VTI:  0.212 m  AI PHT:      538 msec    AORTA  Ao Root diam: 5.00 cm  Ao Asc diam:  4.00 cm   MITRAL VALVE               TRICUSPID VALVE  MV Area (PHT): 1.76 cm    TR Peak grad:   19.4 mmHg  MV Decel Time: 430 msec    TR Vmax:        220.00 cm/s  MV E velocity: 55.28 cm/s  Estimated RAP:  3.00 mmHg  MV A velocity: 76.00 cm/s  RVSP:           22.4 mmHg  MV E/A ratio:  0.73                             SHUNTS                             Systemic VTI:  0.21 m                             Systemic Diam: 2.50 cm   Karyl Paget Croitoru MD  Electronically signed by Luana Rumple MD  Signature Date/Time: 07/03/2023/11:28:35 AM       A/P: 4.1 cm ascending thoracic aortic aneurysm.  Stressed the importance of good ongoing blood pressure control.  Blood pressure today was very good.  In regards to the 7 mm left lung nodule we will try to get a earlier appointment than July to see Dr. Liam Redhead as it may warrant further evaluation/therapy.  We will see the patient on a yearly basis for CTA of the chest and ongoing surveillance for now.  He is uncertain as to whether he would be a candidate for major aortic surgery with significant comorbidities.      Risk Modification:  Statin:  lipitor  Smoking cessation instruction/counseling given: He does have a history of tobacco abuse  Patient was counseled on importance of Blood Pressure Control.  Despite Medical intervention if the patient notices persistently elevated blood pressure readings.  They are instructed to contact their Primary Care Physician  Please avoid use of Fluoroquinolones as this can potentially increase your risk of  Aortic Rupture and/or Dissection  Patient educated on signs and symptoms of Aortic Dissection, handout also provided in AVS  Lindi Revering, PA-C 08/08/23

## 2023-08-15 ENCOUNTER — Inpatient Hospital Stay: Attending: Internal Medicine | Admitting: Internal Medicine

## 2023-08-15 VITALS — BP 138/75 | HR 69 | Temp 98.0°F | Resp 18 | Ht 73.0 in | Wt 314.0 lb

## 2023-08-15 DIAGNOSIS — Z85118 Personal history of other malignant neoplasm of bronchus and lung: Secondary | ICD-10-CM | POA: Diagnosis not present

## 2023-08-15 DIAGNOSIS — C3411 Malignant neoplasm of upper lobe, right bronchus or lung: Secondary | ICD-10-CM

## 2023-08-15 DIAGNOSIS — Z923 Personal history of irradiation: Secondary | ICD-10-CM | POA: Insufficient documentation

## 2023-08-15 DIAGNOSIS — N289 Disorder of kidney and ureter, unspecified: Secondary | ICD-10-CM | POA: Insufficient documentation

## 2023-08-15 DIAGNOSIS — Z9221 Personal history of antineoplastic chemotherapy: Secondary | ICD-10-CM | POA: Diagnosis not present

## 2023-08-15 DIAGNOSIS — R911 Solitary pulmonary nodule: Secondary | ICD-10-CM | POA: Diagnosis not present

## 2023-08-15 NOTE — Progress Notes (Signed)
 [facility] [phone]   [phone]  OFFICE PROGRESS NOTE  Jonathon Neighbors, MD [address], [address] [place] [address]  DIAGNOSIS: Stage IIIA (T1a, N2, M0) non-small cell lung cancer, adenocarcinoma presented with right upper lobe lung nodule in addition to mediastinal lymphadenopathy diagnosed in March 2018.  Biomarker Findings Tumor Mutational Burden - TMB-Intermediate (8 Muts/Mb) Microsatellite Status - MS-Stable Genomic Findings For a complete list of the genes assayed, please refer to the Appendix. ERBB2 amplification - equivocal? DNMT3A K495fs*192 FUBP1 Q40* KEAP1 G375fs*68 TP53 C275F 7 Disease relevant genes with no reportable alterations: EGFR, KRAS, ALK, BRAF, MET, RET, ROS1   PRIOR THERAPY:  1) Course of concurrent chemoradiation with weekly carboplatin  for AUC of 2 and paclitaxel  45 MG/M2. Status post 6 cycles. Last cycle was given 10/16/2016. 2)  Consolidation treatment with immunotherapy with Imfinzi  (Durvalumab ) 10 MG/M2 every 2 weeks. First dose 12/21/2016.  Status post 26 cycles.  CURRENT THERAPY: Observation.  INTERVAL HISTORY: Jason Russell 73 y.o. male returns to the clinic today for 35-month follow-up visit.Discussed the use of AI scribe software for clinical note transcription with the patient, who gave verbal consent to proceed.  History of Present Illness   Jason Russell is a 73 year old male with stage IIIa non-small cell lung cancer who presents with a new spot on his left lung. He was referred by his primary doctor after a scan showed a spot on his left lung.  He has a history of stage IIIa non-small cell lung cancer, adenocarcinoma, diagnosed in March 2018. He completed a course of concurrent chemoradiation followed by one year of consolidation immunotherapy with Imfinzi , completed in August 2019. Since then, he has been under observation with no recurrence of cancer.  Recently, during a scan intended to  evaluate his heart, a 7 mm noncalcified nodule was found on the left upper lobe of his lung. No symptoms such as cough, chest pain, or weight loss. He denies any recent inflammation or respiratory issues.        MEDICAL HISTORY: Past Medical History:  Diagnosis Date   Adenocarcinoma of right lung, stage 3 (HCC) 08/23/2016   Anemia    Arthritis    "hx right hip"   Asthma    "when I was a child"   Atrial fibrillation (HCC)    Amiodarone  started 10/2011; Coumadin    Automatic implantable cardioverter-defibrillator in situ 10/03/2012   a. St. Jude ICD implantation 10/03/12.   Chronic anticoagulation    Chronic fatigue 10/18/2016   Chronic systolic heart failure (HCC)    a. Echo 7/13: EF 25%;  b. echo 04/2012:  Mild LVH, EF 30-35%, Gr 1 DD, mild AI, mild MR, mild LAE   COPD (chronic obstructive pulmonary disease) (HCC)    Dyslipidemia    Gout    History of blood transfusion 10/15/2013   "don't know where the blood's going; HgB down to 5"   Hyperlipidemia    Hypertension    Hypothyroidism    NICM (nonischemic cardiomyopathy) (HCC)    LHC 4/14:  minimal CAD   Obesity    OSA on CPAP    Thoracic ascending aortic aneurysm (HCC) 07/25/2023   CT 07/25/23: TAA 4.1 cm; 7 mm LUL nodule    Tobacco abuse     ALLERGIES:  has no known allergies.  MEDICATIONS:  Current Outpatient Medications  Medication Sig Dispense Refill   albuterol  (VENTOLIN  HFA) 108 (90 Base) MCG/ACT inhaler Inhale 2 puffs into the  lungs every 4 (four) hours as needed for wheezing or shortness of breath. 8.5 g 5   amiodarone  (PACERONE ) 200 MG tablet TAKE 1 TABLET (200 MG TOTAL) BY MOUTH DAILY. TAKE DAILY EXCEPT SATURDAY AND SUNDAY 90 tablet 2   atorvastatin  (LIPITOR) 40 MG tablet TAKE 1 TABLET BY MOUTH EVERY DAY 90 tablet 3   Bempedoic Acid-Ezetimibe (NEXLIZET ) 180-10 MG TABS Take 1 tablet by mouth daily in the afternoon. 30 tablet 11   bisoprolol  (ZEBETA ) 5 MG tablet TAKE 1 TABLET (5 MG TOTAL) BY MOUTH DAILY. 90 tablet 3    [employer] D3 2000 units CAPS Take 2,000 Units by mouth daily.   11   dapagliflozin  propanediol (FARXIGA ) 10 MG TABS tablet Take 1 tablet (10 mg total) by mouth daily before breakfast. 90 tablet 3   ferrous gluconate  (FERGON) 324 MG tablet TAKE 1 TABLET BY MOUTH THREE TIMES A DAY WITH MEALS 270 tablet 3   Fluticasone -Umeclidin-Vilant (TRELEGY ELLIPTA ) 100-62.5-25 MCG/ACT AEPB INHALE 1 PUFF BY MOUTH EVERY DAY 180 each 3   furosemide  (LASIX ) 40 MG tablet Take one tablet (40 mg) by mouth daily with an extra tablet (80 mg total) twice a week only.     JARDIANCE 10 MG TABS tablet Take 10 mg by mouth daily.     levothyroxine  (SYNTHROID ) 50 MCG tablet TAKE 1 TABLET BY MOUTH EVERY DAY BEFORE BREAKFAST 90 tablet 2   magnesium  oxide (MAG-OX) 400 (241.3 Mg) MG tablet Take 1 tablet (400 mg total) by mouth daily. 30 tablet 0   Polyvinyl Alcohol -Povidone (REFRESH OP) Place 1 drop into both eyes every morning.     Potassium Chloride  ER 20 MEQ TBCR TAKE 1 TABLET BY MOUTH EVERY DAY 90 tablet 2   sacubitril -valsartan  (ENTRESTO ) 97-103 MG Take 1 tablet by mouth 2 (two) times daily. 180 tablet 3   sildenafil  (VIAGRA ) 100 MG tablet TAKE 1 TABLET BY MOUTH EVERY DAY AS NEEDED FOR ERECTILE DYSFUNCTION 30 tablet 1   spironolactone  (ALDACTONE ) 25 MG tablet TAKE 1 TABLET (25 MG TOTAL) BY MOUTH DAILY. 90 tablet 2   ULORIC  40 MG tablet Take 1 tablet (40 mg total) by mouth daily. 30 tablet 0   XARELTO  20 MG TABS tablet TAKE 1 TABLET BY MOUTH EVERY DAY 90 tablet 1   No current facility-administered medications for this visit.    SURGICAL HISTORY:  Past Surgical History:  Procedure Laterality Date   CARDIAC DEFIBRILLATOR PLACEMENT  2014   CARDIOVERSION  2011   CARDIOVERSION N/A 03/15/2020   Procedure: CARDIOVERSION;  Surgeon: Elmyra Haggard, MD;  Location: [facility] ENDOSCOPY;  Service: Cardiovascular;  Laterality: N/A;   COLONOSCOPY WITH PROPOFOL  Left 10/17/2013   Procedure: COLONOSCOPY WITH PROPOFOL ;  Surgeon: Claudette Cue, MD;   Location: [facility] ENDOSCOPY;  Service: Endoscopy;  Laterality: Left;   ESOPHAGOGASTRODUODENOSCOPY N/A 10/17/2013   Procedure: ESOPHAGOGASTRODUODENOSCOPY (EGD);  Surgeon: Claudette Cue, MD;  Location: [facility] ENDOSCOPY;  Service: Endoscopy;  Laterality: N/A;   ESOPHAGOGASTRODUODENOSCOPY (EGD) WITH PROPOFOL  N/A 06/14/2018   Procedure: ESOPHAGOGASTRODUODENOSCOPY (EGD) WITH PROPOFOL ;  Surgeon: Albertina Hugger, MD;  Location: [facility] ENDOSCOPY;  Service: Gastroenterology;  Laterality: N/A;   GIVENS CAPSULE STUDY N/A 10/29/2013   Procedure: GIVENS CAPSULE STUDY;  Surgeon: Claudette Cue, MD;  Location: [facility] ENDOSCOPY;  Service: Endoscopy;  Laterality: N/A;   ICD GENERATOR CHANGEOUT N/A 02/13/2018   Procedure: ICD GENERATOR CHANGEOUT;  Surgeon: Lei Pump, MD;  Location: [facility] INVASIVE CV LAB;  Service: Cardiovascular;  Laterality: N/A;   IMPLANTABLE CARDIOVERTER  DEFIBRILLATOR IMPLANT Left 10/03/2012   Procedure: IMPLANTABLE CARDIOVERTER DEFIBRILLATOR IMPLANT;  Surgeon: Verona Goodwill, MD;  Location: [facility] CATH LAB;  Service: Cardiovascular;  Laterality: Left;   IR FLUORO GUIDE PORT INSERTION RIGHT  09/21/2016   IR US  GUIDE VASC ACCESS RIGHT  09/21/2016   JOINT REPLACEMENT     REVERSE SHOULDER ARTHROPLASTY Right 09/22/2020   Procedure: SHOULDER HEMI ARTHROPLASTY;  Surgeon: Micheline Ahr, MD;  Location: [facility] ORS;  Service: Orthopedics;  Laterality: Right;   SAVORY DILATION N/A 06/14/2018   Procedure: SAVORY DILATION;  Surgeon: Albertina Hugger, MD;  Location: [facility] ENDOSCOPY;  Service: Gastroenterology;  Laterality: N/A;   TONSILLECTOMY  1950's   TOTAL HIP ARTHROPLASTY Right 11/25/1997   V TACH ABLATION N/A 09/15/2021   Procedure: V TACH ABLATION;  Surgeon: Tammie Fall, MD;  Location: [facility] INVASIVE CV LAB;  Service: Cardiovascular;  Laterality: N/A;   VIDEO BRONCHOSCOPY WITH ENDOBRONCHIAL ULTRASOUND N/A 08/09/2016   Procedure: VIDEO BRONCHOSCOPY WITH ENDOBRONCHIAL ULTRASOUND;  Surgeon: Samual Crochet, MD;  Location:  [facility] OR;  Service: Thoracic;  Laterality: N/A;    REVIEW OF SYSTEMS:  Constitutional: negative Eyes: negative Ears, nose, mouth, throat, and face: negative Respiratory: positive for dyspnea on exertion Cardiovascular: negative Gastrointestinal: negative Genitourinary:negative Integument/breast: negative Hematologic/lymphatic: negative Musculoskeletal:negative Neurological: negative Behavioral/Psych: negative Endocrine: negative Allergic/Immunologic: negative   PHYSICAL EXAMINATION: General appearance: alert, cooperative, and no distress Head: Normocephalic, without obvious abnormality, atraumatic Neck: no adenopathy, no JVD, supple, symmetrical, trachea midline, and thyroid  not enlarged, symmetric, no tenderness/mass/nodules Lymph nodes: Cervical, supraclavicular, and axillary nodes normal. Resp: clear to auscultation bilaterally Back: symmetric, no curvature. ROM normal. No CVA tenderness. Cardio: regular rate and rhythm, S1, S2 normal, no murmur, click, rub or gallop GI: soft, non-tender; bowel sounds normal; no masses,  no organomegaly Extremities: extremities normal, atraumatic, no cyanosis or edema Neurologic: Alert and oriented X 3, normal strength and tone. Normal symmetric reflexes. Normal coordination and gait  ECOG PERFORMANCE STATUS: 1 - Symptomatic but completely ambulatory  Blood pressure 138/75, pulse 69, temperature 98 F (36.7 C), temperature source Temporal, resp. rate 18, height 6\' 1"  (1.854 m), weight (!) 314 lb (142.4 kg), SpO2 100%.  LABORATORY DATA: Lab Results  Component Value Date   WBC 5.6 05/16/2023   HGB 12.3 (L) 05/16/2023   HCT 38.8 (L) 05/16/2023   MCV 94.6 05/16/2023   PLT 148 (L) 05/16/2023      Chemistry      Component Value Date/Time   NA 141 07/18/2023 1425   NA 141 03/29/2017 1038   K 4.3 07/18/2023 1425   K 4.0 03/29/2017 1038   CL 106 07/18/2023 1425   CO2 21 07/18/2023 1425   CO2 27 03/29/2017 1038   BUN 41 (H) 07/18/2023 1425    BUN 28.6 (H) 03/29/2017 1038   CREATININE 2.46 (H) 07/18/2023 1425   CREATININE 2.90 (H) 05/16/2023 1026   CREATININE 1.6 (H) 03/29/2017 1038      Component Value Date/Time   CALCIUM  9.2 07/18/2023 1425   CALCIUM  9.3 03/29/2017 1038   ALKPHOS 68 05/16/2023 1026   ALKPHOS 105 03/29/2017 1038   AST 19 05/16/2023 1026   AST 18 03/29/2017 1038   ALT 20 05/16/2023 1026   ALT 13 03/29/2017 1038   BILITOT 0.3 05/16/2023 1026   BILITOT 0.62 03/29/2017 1038       RADIOGRAPHIC STUDIES: CT ANGIO CHEST AORTA W/CM & OR WO/CM Result Date: 07/25/2023 CLINICAL DATA:  Aortic dilatation EXAM: CT ANGIOGRAPHY CHEST  WITH CONTRAST TECHNIQUE: Multidetector CT imaging of the chest was performed using the standard protocol during bolus administration of intravenous contrast. Multiplanar CT image reconstructions and MIPs were obtained to evaluate the vascular anatomy. Multiplanar image (3D post-processing) reconstructions and MIPs were obtained to evaluate the vascular anatomy. RADIATION DOSE REDUCTION: This exam was performed according to the departmental dose-optimization program which includes automated exposure control, adjustment of the mA and/or kV according to patient size and/or use of iterative reconstruction technique. CONTRAST:  75mL OMNIPAQUE  IOHEXOL  350 MG/ML SOLN COMPARISON:  Chest CT performed May 16, 2023 FINDINGS: Cardiovascular: Enlarged heart. An implant is present overlying the left chest with leads terminating in the low right ventricle. Coronary sinus: 45 mm Tubular ascending thoracic aorta: 41 mm Aortic arch: 39 mm Proximal descending thoracic aorta: 41 mm Descending thoracic aorta at the level of the aortic hiatus: 30 mm Main pulmonary artery: Within normal limits. Mediastinum/Nodes: Nothing significant. Lungs/Pleura: There is pleural thickening/scarring along the medial aspect of the right hemithorax, unchanged from prior. A 7 mm noncalcified pulmonary nodule is present in the left upper  lobe on image 30 of series 8. Upper Abdomen: Right-sided nephrolithiasis. Musculoskeletal: Not significantly changed. Review of the MIP images confirms the above findings. IMPRESSION: 1. Aneurysm of the tubular ascending thoracic aorta measuring 4.1 cm. 2. Recommend annual imaging followup by CTA or MRA. This recommendation follows 2010 ACCF/AHA/AATS/ACR/ASA/SCA/SCAI/SIR/STS/SVM Guidelines for the Diagnosis and Management of Patients with Thoracic Aortic Disease. Circulation. 2010; 121: W098-J191. Aortic aneurysm NOS (ICD10-I71.9) 3. A new noncalcified pulmonary nodule is present in the left upper lobe measuring 7 mm. This was not seen on prior imaging. This is too small for CT-guided biopsy. This also warrants pulmonary/oncology follow-up given prior oncologic history. Electronically Signed   By: Reagan Camera M.D.   On: 07/25/2023 11:02     ASSESSMENT AND PLAN:  This is a very pleasant 73 years old African-American male with stage IIIA non-small cell lung cancer, adenocarcinoma.  The patient completed 6 weeks of concurrent chemoradiation with weekly carboplatin  and paclitaxel  and tolerated his treatment well except for odynophagia. The patient was then started on consolidation treatment with immunotherapy with Imfinzi  (Durvalumab ) status post 26 cycles.  The patient is currently on observation and he is feeling fine. He had CT angiogram of the chest and aorta performed few weeks ago and incidentally found a 7 mm noncalcified pulmonary nodule in the left upper lobe.  I personally and independently reviewed the scan images and discussed the result and showed the images to the patient today. Assessment and Plan    Stage IIIa non-small cell lung cancer Stage IIIa non-small cell lung cancer, adenocarcinoma, diagnosed in March 2018. Status post concurrent chemoradiation and one year of consolidation immunotherapy with Imfinzi , completed in August 2019. Currently asymptomatic with no chest pain, cough,  hemoptysis, or weight loss. Recent coronary evaluation scan incidentally noted a 7mm nodule in the left upper lobe, but no acute changes or symptoms suggestive of recurrence. - Maintain current follow-up schedule  7mm nodule in left upper lobe A 7mm noncalcified nodule in the left upper lobe was noted on a recent scan for coronary evaluation. The nodule is asymptomatic. Differential diagnosis includes benign causes such as mucus plugging or inflammation, but malignancy cannot be entirely ruled out. Given the small size, it is not amenable to PET scan evaluation. The nodule will be monitored with follow-up imaging. If malignant, it can be managed with radiation therapy. - Order follow-up scan in August - Arrange follow-up  appointment 7-10 days after the August scan to review results    For the renal insufficiency, he is followed by nephrology and he was advised to avoid any NSAIDs and discussed with his nephrologist for any additional recommendation. He was advised to call immediately if he has any other concerning symptoms in the interval. The patient voices understanding of current disease status and treatment options and is in agreement with the current care plan. All questions were answered. The patient knows to call the clinic with any problems, questions or concerns. We can certainly see the patient much sooner if necessary. Disclaimer: This note was dictated with voice recognition software. Similar sounding words can inadvertently be transcribed and may not be corrected upon review.

## 2023-08-16 ENCOUNTER — Telehealth: Payer: Self-pay

## 2023-08-16 ENCOUNTER — Other Ambulatory Visit (HOSPITAL_COMMUNITY): Payer: Self-pay

## 2023-08-16 NOTE — Telephone Encounter (Signed)
 Pharmacy Patient Advocate Encounter  Insurance verification completed.   The patient is insured through [employer] claim for [employer]. Currently a quantity of 90 is a 90 day supply and the co-pay is $0 .   This test claim was processed through [facility]- copay amounts may vary at other pharmacies due to pharmacy/plan contracts, or as the patient moves through the different stages of their insurance plan.

## 2023-08-20 DIAGNOSIS — N183 Chronic kidney disease, stage 3 unspecified: Secondary | ICD-10-CM | POA: Diagnosis not present

## 2023-08-20 DIAGNOSIS — I129 Hypertensive chronic kidney disease with stage 1 through stage 4 chronic kidney disease, or unspecified chronic kidney disease: Secondary | ICD-10-CM | POA: Diagnosis not present

## 2023-08-21 DIAGNOSIS — E038 Other specified hypothyroidism: Secondary | ICD-10-CM | POA: Diagnosis not present

## 2023-08-21 DIAGNOSIS — N1832 Chronic kidney disease, stage 3b: Secondary | ICD-10-CM | POA: Diagnosis not present

## 2023-08-21 DIAGNOSIS — E6609 Other obesity due to excess calories: Secondary | ICD-10-CM | POA: Diagnosis not present

## 2023-08-21 DIAGNOSIS — E782 Mixed hyperlipidemia: Secondary | ICD-10-CM | POA: Diagnosis not present

## 2023-08-21 DIAGNOSIS — N400 Enlarged prostate without lower urinary tract symptoms: Secondary | ICD-10-CM | POA: Diagnosis not present

## 2023-08-21 DIAGNOSIS — E1165 Type 2 diabetes mellitus with hyperglycemia: Secondary | ICD-10-CM | POA: Diagnosis not present

## 2023-08-21 DIAGNOSIS — M545 Low back pain, unspecified: Secondary | ICD-10-CM | POA: Diagnosis not present

## 2023-08-21 DIAGNOSIS — I509 Heart failure, unspecified: Secondary | ICD-10-CM | POA: Diagnosis not present

## 2023-08-21 DIAGNOSIS — E1122 Type 2 diabetes mellitus with diabetic chronic kidney disease: Secondary | ICD-10-CM | POA: Diagnosis not present

## 2023-08-21 DIAGNOSIS — I130 Hypertensive heart and chronic kidney disease with heart failure and stage 1 through stage 4 chronic kidney disease, or unspecified chronic kidney disease: Secondary | ICD-10-CM | POA: Diagnosis not present

## 2023-08-21 DIAGNOSIS — Z6841 Body Mass Index (BMI) 40.0 and over, adult: Secondary | ICD-10-CM | POA: Diagnosis not present

## 2023-08-22 DIAGNOSIS — I1310 Hypertensive heart and chronic kidney disease without heart failure, with stage 1 through stage 4 chronic kidney disease, or unspecified chronic kidney disease: Secondary | ICD-10-CM | POA: Diagnosis not present

## 2023-08-22 DIAGNOSIS — I129 Hypertensive chronic kidney disease with stage 1 through stage 4 chronic kidney disease, or unspecified chronic kidney disease: Secondary | ICD-10-CM | POA: Diagnosis not present

## 2023-08-22 DIAGNOSIS — N1832 Chronic kidney disease, stage 3b: Secondary | ICD-10-CM | POA: Diagnosis not present

## 2023-08-22 DIAGNOSIS — D631 Anemia in chronic kidney disease: Secondary | ICD-10-CM | POA: Diagnosis not present

## 2023-08-22 DIAGNOSIS — M898X9 Other specified disorders of bone, unspecified site: Secondary | ICD-10-CM | POA: Diagnosis not present

## 2023-08-24 ENCOUNTER — Other Ambulatory Visit: Payer: Self-pay

## 2023-08-24 DIAGNOSIS — M545 Low back pain, unspecified: Secondary | ICD-10-CM | POA: Diagnosis not present

## 2023-08-24 MED ORDER — AMIODARONE HCL 200 MG PO TABS: 200.0000 mg | ORAL_TABLET | Freq: Every day | ORAL | 0 refills | Status: DC

## 2023-09-03 ENCOUNTER — Ambulatory Visit: Attending: Internal Medicine

## 2023-09-03 DIAGNOSIS — I5022 Chronic systolic (congestive) heart failure: Secondary | ICD-10-CM | POA: Diagnosis not present

## 2023-09-03 DIAGNOSIS — Z95810 Presence of automatic (implantable) cardiac defibrillator: Secondary | ICD-10-CM | POA: Diagnosis not present

## 2023-09-04 NOTE — Addendum Note (Signed)
 Addended by: Edra Govern D on: 09/04/2023 09:51 AM   Modules accepted: Orders

## 2023-09-04 NOTE — Progress Notes (Signed)
 Remote ICD transmission.

## 2023-09-05 NOTE — Progress Notes (Signed)
 EPIC Encounter for ICM Monitoring  Patient Name: Jason Russell is a 73 y.o. male Date: 09/05/2023 Primary Care Physican: Jonathon Neighbors, MD Primary Cardiologist: Avanell Bob Electrophysiologist: Carolynne Citron 09/13/2022 Weight: 280 lbs 11/23/2022 Weight: 280 lbs 12/27/2022 Weight: 280 - 281 lbs 02/05/2023 Weight: Not weighing at home.  05/21/2022 Weight: 288 lbs 06/25/2023 Weight: 288 lbs 09/05/2023 Weight: 290 lbs   Spoke with patient and heart failure questions reviewed.  Transmission results reviewed.  Pt asymptomatic for fluid accumulation.  Pt was in [place]  during decreased impedance for granddaughters graduation.   Diet:  Cooks at home most of the time and occasionally eats out a meal.     CorVue Thoracic impedance suggesting possible fluid accumulation from 5/6-5/13 and possible dryness from 5/18-5/25 in the last month   Prescribed:  Furosemide  40 mg take 1 tablet (40 mg total) by mouth daily with extra tablet (80 mg total) twice a week only.   Potassium 20 mEq 1 tablet daily. Spironolactone  25 mg take 1 tablet daily   Labs: 08/20/2023 Creatinine 2.01, BUN 29, Potassium 3.9, Sodium 142, GFR 35 07/18/2023 Creatinine 1.46, BUN 41, Potassium 4.3, Sodium 141, GFR 27 05/16/2023 Creatinine 2.90, BUN 46, Potassium 4.5, Sodium 142, GFR 22 02/12/2023 Creatinine 2.13, BUN 51, Potassium 4.3, Sodium 141, GFR 32 01/17/2023 Creatinine 2.07, BUN 42, Potassium 3.9, Sodium 144, GFR 34 12/05/2022 Creatinine 2.51, BUN 55, Potassium 4.5, Sodium 142, GFR 27 11/20/2022 Creatinine 2.09, BUN 43, Potassium 4.0, Sodium 144, GFR 33, BNP 4,310 A complete set of results can be found in results review   Recommendations:  No changes and encouraged to call if experiencing any fluid symptoms.   Follow-up plan: ICM clinic phone appointment on 10/29/2023.   91 day device clinic remote transmission 10/15/2023.     EP/Cardiology Office Visits:   Recall 12/05/2023 with Dr Avanell Bob.  Recall 11/18/2023 with Dr Carolynne Citron.    Copy of ICM  check sent to Dr. Carolynne Citron.  3 month ICM trend: 09/03/2023.    12-14 Month ICM trend:     Almyra Jain, RN 09/05/2023 7:25 AM

## 2023-09-11 DIAGNOSIS — M5451 Vertebrogenic low back pain: Secondary | ICD-10-CM | POA: Diagnosis not present

## 2023-09-11 DIAGNOSIS — M47896 Other spondylosis, lumbar region: Secondary | ICD-10-CM | POA: Diagnosis not present

## 2023-09-23 ENCOUNTER — Other Ambulatory Visit: Payer: Self-pay | Admitting: Adult Health

## 2023-09-26 ENCOUNTER — Telehealth: Payer: Self-pay

## 2023-09-26 ENCOUNTER — Inpatient Hospital Stay (HOSPITAL_COMMUNITY)
Admission: EM | Admit: 2023-09-26 | Discharge: 2023-09-28 | DRG: 291 | Disposition: A | Discharge status: Home / Self Care | Attending: Internal Medicine | Admitting: Internal Medicine

## 2023-09-26 ENCOUNTER — Other Ambulatory Visit: Payer: Self-pay

## 2023-09-26 ENCOUNTER — Emergency Department (HOSPITAL_COMMUNITY)

## 2023-09-26 DIAGNOSIS — Z8249 Family history of ischemic heart disease and other diseases of the circulatory system: Secondary | ICD-10-CM | POA: Diagnosis not present

## 2023-09-26 DIAGNOSIS — Z79899 Other long term (current) drug therapy: Secondary | ICD-10-CM | POA: Diagnosis not present

## 2023-09-26 DIAGNOSIS — E66813 Obesity, class 3: Secondary | ICD-10-CM | POA: Diagnosis not present

## 2023-09-26 DIAGNOSIS — I472 Ventricular tachycardia, unspecified: Secondary | ICD-10-CM | POA: Diagnosis present

## 2023-09-26 DIAGNOSIS — I4891 Unspecified atrial fibrillation: Principal | ICD-10-CM | POA: Diagnosis present

## 2023-09-26 DIAGNOSIS — I255 Ischemic cardiomyopathy: Secondary | ICD-10-CM | POA: Diagnosis present

## 2023-09-26 DIAGNOSIS — M109 Gout, unspecified: Secondary | ICD-10-CM | POA: Diagnosis not present

## 2023-09-26 DIAGNOSIS — I480 Paroxysmal atrial fibrillation: Secondary | ICD-10-CM | POA: Diagnosis present

## 2023-09-26 DIAGNOSIS — Z87891 Personal history of nicotine dependence: Secondary | ICD-10-CM

## 2023-09-26 DIAGNOSIS — I10 Essential (primary) hypertension: Secondary | ICD-10-CM | POA: Diagnosis present

## 2023-09-26 DIAGNOSIS — I509 Heart failure, unspecified: Secondary | ICD-10-CM

## 2023-09-26 DIAGNOSIS — D6869 Other thrombophilia: Secondary | ICD-10-CM | POA: Diagnosis not present

## 2023-09-26 DIAGNOSIS — C3411 Malignant neoplasm of upper lobe, right bronchus or lung: Secondary | ICD-10-CM | POA: Diagnosis not present

## 2023-09-26 DIAGNOSIS — I080 Rheumatic disorders of both mitral and aortic valves: Secondary | ICD-10-CM | POA: Diagnosis present

## 2023-09-26 DIAGNOSIS — Z7901 Long term (current) use of anticoagulants: Secondary | ICD-10-CM

## 2023-09-26 DIAGNOSIS — Z95810 Presence of automatic (implantable) cardiac defibrillator: Secondary | ICD-10-CM | POA: Diagnosis not present

## 2023-09-26 DIAGNOSIS — I428 Other cardiomyopathies: Secondary | ICD-10-CM | POA: Diagnosis not present

## 2023-09-26 DIAGNOSIS — Z801 Family history of malignant neoplasm of trachea, bronchus and lung: Secondary | ICD-10-CM

## 2023-09-26 DIAGNOSIS — R0602 Shortness of breath: Secondary | ICD-10-CM | POA: Diagnosis not present

## 2023-09-26 DIAGNOSIS — Z79890 Hormone replacement therapy (postmenopausal): Secondary | ICD-10-CM | POA: Diagnosis not present

## 2023-09-26 DIAGNOSIS — I110 Hypertensive heart disease with heart failure: Secondary | ICD-10-CM | POA: Diagnosis not present

## 2023-09-26 DIAGNOSIS — I130 Hypertensive heart and chronic kidney disease with heart failure and stage 1 through stage 4 chronic kidney disease, or unspecified chronic kidney disease: Principal | ICD-10-CM | POA: Diagnosis present

## 2023-09-26 DIAGNOSIS — E785 Hyperlipidemia, unspecified: Secondary | ICD-10-CM | POA: Diagnosis present

## 2023-09-26 DIAGNOSIS — J430 Unilateral pulmonary emphysema [MacLeod's syndrome]: Secondary | ICD-10-CM

## 2023-09-26 DIAGNOSIS — R0989 Other specified symptoms and signs involving the circulatory and respiratory systems: Secondary | ICD-10-CM | POA: Diagnosis not present

## 2023-09-26 DIAGNOSIS — I5023 Acute on chronic systolic (congestive) heart failure: Secondary | ICD-10-CM | POA: Diagnosis present

## 2023-09-26 DIAGNOSIS — J4489 Other specified chronic obstructive pulmonary disease: Secondary | ICD-10-CM | POA: Diagnosis present

## 2023-09-26 DIAGNOSIS — E876 Hypokalemia: Secondary | ICD-10-CM | POA: Diagnosis present

## 2023-09-26 DIAGNOSIS — J9811 Atelectasis: Secondary | ICD-10-CM | POA: Diagnosis not present

## 2023-09-26 DIAGNOSIS — D509 Iron deficiency anemia, unspecified: Secondary | ICD-10-CM | POA: Diagnosis present

## 2023-09-26 DIAGNOSIS — I4510 Unspecified right bundle-branch block: Secondary | ICD-10-CM | POA: Diagnosis present

## 2023-09-26 DIAGNOSIS — J449 Chronic obstructive pulmonary disease, unspecified: Secondary | ICD-10-CM | POA: Diagnosis present

## 2023-09-26 DIAGNOSIS — I77810 Thoracic aortic ectasia: Secondary | ICD-10-CM | POA: Diagnosis not present

## 2023-09-26 DIAGNOSIS — Z7984 Long term (current) use of oral hypoglycemic drugs: Secondary | ICD-10-CM

## 2023-09-26 DIAGNOSIS — Z96611 Presence of right artificial shoulder joint: Secondary | ICD-10-CM | POA: Diagnosis present

## 2023-09-26 DIAGNOSIS — Z6841 Body Mass Index (BMI) 40.0 and over, adult: Secondary | ICD-10-CM

## 2023-09-26 DIAGNOSIS — Z96641 Presence of right artificial hip joint: Secondary | ICD-10-CM | POA: Diagnosis present

## 2023-09-26 DIAGNOSIS — E039 Hypothyroidism, unspecified: Secondary | ICD-10-CM | POA: Diagnosis not present

## 2023-09-26 DIAGNOSIS — N1832 Chronic kidney disease, stage 3b: Secondary | ICD-10-CM | POA: Diagnosis present

## 2023-09-26 LAB — BASIC METABOLIC PANEL WITH GFR
Anion gap: 12 (ref 5–15)
BUN: 19 mg/dL (ref 8–23)
CO2: 18 mmol/L — ABNORMAL LOW (ref 22–32)
Calcium: 8.9 mg/dL (ref 8.9–10.3)
Chloride: 112 mmol/L — ABNORMAL HIGH (ref 98–111)
Creatinine, Ser: 2.01 mg/dL — ABNORMAL HIGH (ref 0.61–1.24)
GFR, Estimated: 35 mL/min — ABNORMAL LOW (ref >60)
Glucose, Bld: 100 mg/dL — ABNORMAL HIGH (ref 70–99)
Potassium: 4.1 mmol/L (ref 3.5–5.1)
Sodium: 142 mmol/L (ref 135–145)

## 2023-09-26 LAB — CBC WITH DIFFERENTIAL/PLATELET
Abs Immature Granulocytes: 0.06 10*3/uL (ref 0.00–0.07)
Basophils Absolute: 0 10*3/uL (ref 0.0–0.1)
Basophils Relative: 0 %
Eosinophils Absolute: 0.1 10*3/uL (ref 0.0–0.5)
Eosinophils Relative: 1 %
HCT: 36.9 % — ABNORMAL LOW (ref 39.0–52.0)
Hemoglobin: 11.5 g/dL — ABNORMAL LOW (ref 13.0–17.0)
Immature Granulocytes: 1 %
Lymphocytes Relative: 6 %
Lymphs Abs: 0.5 10*3/uL — ABNORMAL LOW (ref 0.7–4.0)
MCH: 29.9 pg (ref 26.0–34.0)
MCHC: 31.2 g/dL (ref 30.0–36.0)
MCV: 96.1 fL (ref 80.0–100.0)
Monocytes Absolute: 1.2 10*3/uL — ABNORMAL HIGH (ref 0.1–1.0)
Monocytes Relative: 14 %
Neutro Abs: 6.5 10*3/uL (ref 1.7–7.7)
Neutrophils Relative %: 78 %
Platelets: 194 10*3/uL (ref 150–400)
RBC: 3.84 MIL/uL — ABNORMAL LOW (ref 4.22–5.81)
RDW: 14.6 % (ref 11.5–15.5)
WBC: 8.3 10*3/uL (ref 4.0–10.5)
nRBC: 0 % (ref 0.0–0.2)

## 2023-09-26 LAB — TROPONIN I (HIGH SENSITIVITY)
Troponin I (High Sensitivity): 40 ng/L — ABNORMAL HIGH (ref <18)
Troponin I (High Sensitivity): 31 ng/L — ABNORMAL HIGH (ref <18)

## 2023-09-26 LAB — PROTIME-INR
INR: 2.7 — ABNORMAL HIGH (ref 0.8–1.2)
Prothrombin Time: 29.9 s — ABNORMAL HIGH (ref 11.4–15.2)

## 2023-09-26 LAB — BRAIN NATRIURETIC PEPTIDE: B Natriuretic Peptide: 468.9 pg/mL — ABNORMAL HIGH (ref 0.0–100.0)

## 2023-09-26 MED ORDER — IPRATROPIUM-ALBUTEROL 0.5-2.5 (3) MG/3ML IN SOLN
3.0000 mL | Freq: Once | RESPIRATORY_TRACT | Status: AC
  Administered 2023-09-26: 3 mL via RESPIRATORY_TRACT
  Filled 2023-09-26: qty 3

## 2023-09-26 MED ORDER — FUROSEMIDE 10 MG/ML IJ SOLN
40.0000 mg | Freq: Once | INTRAMUSCULAR | Status: AC
  Administered 2023-09-26: 40 mg via INTRAVENOUS
  Filled 2023-09-26: qty 4

## 2023-09-26 NOTE — ED Triage Notes (Signed)
 Pt. Was instructed by Dr. [name] and Dr. [name] to come to ED for abnormal heart rhythm.  The defibrillator sensed the rhythm and sent message to Dr. [name] Pt. Denies any chest pain,  Pt. Having sob on exertion since Thursday.   Pt. Is getting over a cold

## 2023-09-26 NOTE — Assessment & Plan Note (Addendum)
 Continue blood pressure control with entresto , bisoprolol  and spironolactone 

## 2023-09-26 NOTE — Telephone Encounter (Addendum)
 4 VT-2 classified events 6/21 @ 19:37 & 19:44, HR's 203-210.  EGM's c/w irregular R-R with periods of regularity.  Likely AF with RVR, ATP delivered x1-2 slowing rate below   VT-2 zone - route to triage high alert  33 NSVT events also irregular R-R, hx of AF, Xarelto  per EPIC, significant increase in daily HR trends  HF diagnostics currenlty abnormal.   Patient states that he has been experiencing SOB, palpitations and fatigue.  Also has a bad cold for past week and is taking coricidine and plain mucinex for symptoms.  No missed doses of his medications.   Reviewed with A. Tillery PA-C.  Given symptoms, combined with elevated HF logistics and presenting showing likely AF with irregular R-R and fast heart rates, he needs to be seen in ER for evaluation.  Likely a dual tach on recorded episodes below, patient has hx of VT.    Patient is in agreement and will going to the ER today at [facility] for eval.

## 2023-09-26 NOTE — Assessment & Plan Note (Signed)
No acute flare.  

## 2023-09-26 NOTE — Assessment & Plan Note (Addendum)
 Stage IIIa  non small cell lung cancer. (March 2018)  He had chemoradiation and immunotherapy completed in August 2019. (Old records personally reviewed)   Follow up as outpatient with Dr Sherrod.  Port in right internal jugular vein

## 2023-09-26 NOTE — Assessment & Plan Note (Addendum)
 Continue amiodarone  and bisoprolol  for rate and rhythm control  Anticoagulation with rivaroxaban .   EP consulted, device interrogation noted atrial fibrillation, apparently received ATP therapy for his irregular rhythm with slowing the rhythm.  Electrolytes were corrected

## 2023-09-26 NOTE — H&P (Signed)
 History and Physical    Patient: Jason Russell DOB: 01-08-1951 DOA: 09/26/2023 DOS: the patient was seen and examined on 09/26/2023 PCP: [name], MD  Patient coming from: Home  Chief Complaint:  Chief Complaint  Patient presents with   AICD Problem    Pt. Was notified by Dr. [name] and Dr. [name] that they received a report that defibrillator sensed heart having irregular rhythmn   HPI: Jason Russell is a 73 y.o. male with medical history significant of atrial fibrillation, heart failure, sp AICD, COPD, dyslipidemia, gout, OSA and obesity who presented with dyspnea. Reported 6 days of worsening dyspnea. Progressive worsening symptoms to the point where he was having dyspnea with minimal efforts. 2 days after his symptoms started he [place]. There he spent most of the time in bed due to dyspnea and generalized weakness. Positive PND.  On his way back 2 days ago he was not able to get out of the train without assistance.  Denies any chest pain or palpitations. Today he was called by his cardiologist due to detected irregular heart rhythm through his AICD, advising him to come to the hospital.   He has been adherent to his medications and reports being compliant with a salt and fluid restricted diet.   Noted report from June 24 ICD, with multiple episodes of non sustained ventricular tachycardia.     Review of Systems: As mentioned in the history of present illness. All other systems reviewed and are negative. 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 [employer]. Jude ICD implantation 10/03/12.   Chronic anticoagulation    Chronic fatigue 10/18/2016   Chronic systolic heart  failure (HCC)    a. Echo 7/13: EF 25%;  b. echo 04/2012:  Mild LVH, EF 30-35%, Gr 1 DD, mild AI, mild MR, mild LAE   COPD (chronic obstructive pulmonary disease) (HCC)    Dyslipidemia    Gout    History of blood transfusion 10/15/2013   don't know where the blood's going; HgB down to 5   Hyperlipidemia    Hypertension    Hypothyroidism    NICM (nonischemic cardiomyopathy) (HCC)    LHC 4/14:  minimal CAD   Obesity    OSA on CPAP    Thoracic ascending aortic aneurysm (HCC) 07/25/2023   CT 07/25/23: TAA 4.1 cm; 7 mm LUL nodule    Tobacco abuse    Past Surgical History:  Procedure Laterality Date   CARDIAC DEFIBRILLATOR PLACEMENT  2014   CARDIOVERSION  2011   CARDIOVERSION N/A 03/15/2020   Procedure: CARDIOVERSION;  Surgeon: [name] Vina GAILS, MD;  Location: [facility] ENDOSCOPY;  Service: Cardiovascular;  Laterality: N/A;   COLONOSCOPY WITH PROPOFOL  Left 10/17/2013   Procedure: COLONOSCOPY WITH PROPOFOL ;  Surgeon: Lamar JONETTA Aho, MD;  Location: [facility] ENDOSCOPY;  Service: Endoscopy;  Laterality: Left;   ESOPHAGOGASTRODUODENOSCOPY N/A 10/17/2013   Procedure: ESOPHAGOGASTRODUODENOSCOPY (EGD);  Surgeon: Lamar JONETTA Aho, MD;  Location: [facility] ENDOSCOPY;  Service: Endoscopy;  Laterality: N/A;   ESOPHAGOGASTRODUODENOSCOPY (EGD) WITH PROPOFOL  N/A 06/14/2018   Procedure: ESOPHAGOGASTRODUODENOSCOPY (EGD) WITH PROPOFOL ;  Surgeon: Legrand Victory LITTIE DOUGLAS, MD;  Location: [facility] ENDOSCOPY;  Service: Gastroenterology;  Laterality: N/A;   GIVENS CAPSULE STUDY N/A 10/29/2013   Procedure: GIVENS CAPSULE STUDY;  Surgeon: Lamar JONETTA Aho, MD;  Location: [facility] ENDOSCOPY;  Service: Endoscopy;  Laterality: N/A;   ICD GENERATOR CHANGEOUT N/A 02/13/2018   Procedure: ICD GENERATOR CHANGEOUT;  Surgeon: Inocencio Soyla Lunger, MD;  Location: [facility] INVASIVE CV LAB;  Service: Cardiovascular;  Laterality: N/A;   IMPLANTABLE CARDIOVERTER DEFIBRILLATOR IMPLANT Left 10/03/2012   Procedure: IMPLANTABLE CARDIOVERTER DEFIBRILLATOR IMPLANT;  Surgeon: Elspeth JAYSON Sage, MD;  Location: [facility] CATH LAB;  Service: Cardiovascular;  Laterality: Left;   IR FLUORO GUIDE PORT INSERTION RIGHT  09/21/2016   IR US  GUIDE VASC ACCESS RIGHT  09/21/2016   JOINT REPLACEMENT     REVERSE SHOULDER ARTHROPLASTY Right 09/22/2020   Procedure: SHOULDER HEMI ARTHROPLASTY;  Surgeon: Cristy Bonner DASEN, MD;  Location: [facility] ORS;  Service: Orthopedics;  Laterality: Right;   SAVORY DILATION N/A 06/14/2018   Procedure: SAVORY DILATION;  Surgeon: Legrand Victory LITTIE DOUGLAS, MD;  Location: [facility] ENDOSCOPY;  Service: Gastroenterology;  Laterality: N/A;   TONSILLECTOMY  1950's   TOTAL HIP ARTHROPLASTY Right 11/25/1997   V TACH ABLATION N/A 09/15/2021   Procedure: V TACH ABLATION;  Surgeon: [name] Danelle ORN, MD;  Location: [facility] INVASIVE CV LAB;  Service: Cardiovascular;  Laterality: N/A;   VIDEO BRONCHOSCOPY WITH ENDOBRONCHIAL ULTRASOUND N/A 08/09/2016   Procedure: VIDEO BRONCHOSCOPY WITH ENDOBRONCHIAL ULTRASOUND;  Surgeon: Noreen Tonnie BRAVO, MD;  Location: [facility] OR;  Service: Thoracic;  Laterality: N/A;   Social History:  reports that he quit smoking about 7 years ago. His smoking use included cigarettes. He started smoking about 52 years ago. He has a 11.3 pack-year smoking history. He has never used smokeless tobacco. He reports current alcohol  use of about 1.0 standard drink of alcohol  per week. He reports that he does not use drugs.  No Known Allergies  Family History  Problem Relation Age of Onset   Hypertension Mother    Heart Problems Mother    Other Father        deceased- unknow reason   Lung cancer Brother     Prior to Admission medications   Medication Sig Start Date End Date Taking? Authorizing Provider  albuterol  (VENTOLIN  HFA) 108 (90 Base) MCG/ACT inhaler Inhale 2 puffs into the lungs every 4 (four) hours as needed for wheezing or shortness of breath. 02/27/23   Parrett, Madelin RAMAN, NP  amiodarone  (PACERONE ) 200 MG tablet Take 1 tablet (200 mg total) by mouth daily. 08/24/23   [name] Danelle ORN, MD   atorvastatin  (LIPITOR) 40 MG tablet TAKE 1 TABLET BY MOUTH EVERY DAY 11/28/22   [name] Vina GAILS, MD  Bempedoic Acid-Ezetimibe (NEXLIZET ) 180-10 MG TABS Take 1 tablet by mouth daily in the afternoon. 12/18/22   [name] Vina GAILS, MD  bisoprolol  (ZEBETA ) 5 MG tablet TAKE 1 TABLET (5 MG TOTAL) BY MOUTH DAILY. 12/27/22   Lesia Ozell Barter, PA-C  [employer] D3 2000 units CAPS Take 2,000 Units by mouth daily.  11/02/16   [provider]  dapagliflozin  propanediol (FARXIGA ) 10 MG TABS tablet Take 1 tablet (10 mg total) by mouth daily before breakfast. 12/18/22   [name] Vina GAILS, MD  ferrous gluconate  [facility]) 324 MG tablet TAKE 1 TABLET BY MOUTH THREE TIMES A DAY WITH MEALS 01/16/23   [name] Vina GAILS, MD  furosemide  (LASIX ) 40 MG tablet Take one tablet (40 mg) by mouth daily with an extra tablet (80 mg total) twice a week only. 01/22/23   [name] Vina GAILS,  MD  JARDIANCE 10 MG TABS tablet Take 10 mg by mouth daily.    [provider]  levothyroxine  (SYNTHROID ) 50 MCG tablet TAKE 1 TABLET BY MOUTH EVERY DAY BEFORE BREAKFAST 04/27/23   [name] Vina GAILS, MD  magnesium  oxide (MAG-OX) 400 (241.3 Mg) MG tablet Take 1 tablet (400 mg total) by mouth daily. 01/10/17   Vann, Jessica U, DO  Polyvinyl Alcohol -Povidone (REFRESH OP) Place 1 drop into both eyes every morning.    [provider]  Potassium Chloride  ER 20 MEQ TBCR TAKE 1 TABLET BY MOUTH EVERY DAY 02/26/23   [name] Vina GAILS, MD  sacubitril -valsartan  (ENTRESTO ) 97-103 MG Take 1 tablet by mouth 2 (two) times daily. 12/12/22   [name] Vina GAILS, MD  sildenafil  (VIAGRA ) 100 MG tablet TAKE 1 TABLET BY MOUTH EVERY DAY AS NEEDED FOR ERECTILE DYSFUNCTION 03/22/23   [name] Danelle ORN, MD  spironolactone  (ALDACTONE ) 25 MG tablet TAKE 1 TABLET (25 MG TOTAL) BY MOUTH DAILY. 04/23/23   [name] Danelle ORN, MD  TRELEGY ELLIPTA  100-62.5-25 MCG/ACT AEPB INHALE 1 PUFF BY MOUTH EVERY DAY 09/24/23   Parrett, Madelin RAMAN, NP  ULORIC  40 MG tablet Take 1 tablet (40 mg total) by mouth daily.  10/17/16   Fernande Elspeth BROCKS, MD  XARELTO  20 MG TABS tablet TAKE 1 TABLET BY MOUTH EVERY DAY 10/02/22   [name] Danelle ORN, MD    Physical Exam: Vitals:   09/26/23 1545 09/26/23 1600 09/26/23 1615 09/26/23 1645  BP: 136/79 (!) 144/74 (!) 144/80 (!) 143/61  Pulse: (!) 57 60 (!) 55 62  Resp: (!) 24 (!) 22 (!) 23 (!) 28  Temp:      SpO2: 100% 100% 98% 100%  Weight:      Height:       BP (!) 143/61   Pulse 62   Temp 97.9 F (36.6 C)   Resp (!) 28   Ht 6' 1 (1.854 m)   Wt (!) 142 kg   SpO2 100%   BMI 41.30 kg/m   Neurology awake and alert ENT with no pallor or icterus Cardiovascular with S1 and S2 present, irregular with no gallops, rubs or murmurs Positive moderate JVD Respiratory with rales at bases with no wheezing or rhonchi  Abdomen protuberant but non tender and not distended Positive lower extremity edema ++  Data Reviewed:   Na 142, K 4,1 Cl 112 bicarbonate 18 glucose 100 bun 19 cr 2,0  BNP 468  High sensitive troponin 40  Wbc 8,3 hgb 11.5 plt 194  INR 2,7   Chest radiograph with cardiomegaly, bilateral hilar vascular congestion, no effusions, defibrillator in place with one single right ventricular lead, kinked at the level of the clavicle.   EKG 55 bpm, left axis deviation, right bundle branch block, qtc 473, sinus rhythm with PAC, no significant ST segment or  T wave changes.    Assessment and Plan: * Acute on chronic systolic CHF (congestive heart failure) (HCC) 07/2023 echocardiogram with reduced LV systolic function 35 to 40%, global hypokinesis, moderate to severe dilatation of internal cavity, mild LVH, RV systolic function preserved, LA with moderate dilatation, RA with mild dilatation, RVSP 22,4 mmHg, moderate mitral valve regurgitation, moderate aortic valve regurgitation. Severe aortic root dilatation.   Clinically volume overloaded. Will continue diuresis with furosemide  60 mg IV bid Continue with SGLT 2 inh, spironolactone  and entresto . Continue  bisoprolol .    Hypertension, benign Continue blood pressure control with entresto , bisoprolol  and spironolactone    Atrial fibrillation (HCC) Continue amiodarone   and bisoprolol  for rate and rhythm control  Anticoagulation with rivaroxaban .   ICD detected ventricular tachycardia, non sustained.  Continue telemetry monitoring, keep K at 4 and Mg at 2.  Continue diuresis.  Kinked lead of defibrillator with no evidence of fracture, will follow up with EP recommendations   CKD stage 3b, GFR 30-44 ml/min (HCC) Continue diuresis with furosemide , to keep negative fluid balance Continue SGLT 2 inh and spironolactone    Iron  deficiency anemia Cell count stable.   Gout No acute flare   COPD (chronic obstructive pulmonary disease) (HCC) No signs of acute exacerbation Continue bronchodilator therapy   Hypothyroidism Continue levothyroxine    Primary cancer of right upper lobe of lung (HCC) Follow up as outpatient [place] in right internal jugular vein   Obesity, class 3 Calculated BMI is 41.3     Advance Care Planning:   Code Status: Full Code   Consults: cardiology per ED   Family Communication: I spoke with patient's family at the bedside, we talked in detail about patient's condition, plan of care and prognosis and all questions were addressed.   Severity of Illness: The appropriate patient status for this patient is INPATIENT. Inpatient status is judged to be reasonable and necessary in order to provide the required intensity of service to ensure the patient's safety. The patient's presenting symptoms, physical exam findings, and initial radiographic and laboratory data in the context of their chronic comorbidities is felt to place them at high risk for further clinical deterioration. Furthermore, it is not anticipated that the patient will be medically stable for discharge from the hospital within 2 midnights of admission.   * I certify that at the point of admission it is my  clinical judgment that the patient will require inpatient hospital care spanning beyond 2 midnights from the point of admission due to high intensity of service, high risk for further deterioration and high frequency of surveillance required.*  Author: Elidia Toribio Furnace, MD 09/26/2023 5:08 PM  For on call review [email or web address].

## 2023-09-26 NOTE — Assessment & Plan Note (Addendum)
 07/2023 echocardiogram with reduced LV systolic function 35 to 40%, global hypokinesis, moderate to severe dilatation of internal cavity, mild LVH, RV systolic function preserved, LA with moderate dilatation, RA with mild dilatation, RVSP 22,4 mmHg, moderate mitral valve regurgitation, moderate aortic valve regurgitation. Severe aortic root dilatation.   Patient was placed on IV furosemide  for diuresis, negative fluid balance was achieved, - 3,098 ml and a documented weight loss of 8 Kg, with significant improvement in his symptoms.   Continue with SGLT 2 inh, spironolactone  and entresto . Continue bisoprolol .  Diuresis at home with torsemide 60 mg daily and take 80 mg in case of volume overload.  Will need close follow up as outpatient.

## 2023-09-26 NOTE — Assessment & Plan Note (Addendum)
 Hypokalemia. Base serum cr  to 2.0 to 2,5   Patient tolerated well aggressive diuresis with IV furosemide . A the time of his discharge his serum cr is 2,47, with K at 3,7 and serum bicarbonate at 25  Na 142 and Mg 1,8   Patient will receive Kcl 20 meq and 2 g mag sulfate prior to his discharge Plan to continue diuresis with torsemide and SGLT 2 inh He has been tolerating well mineralocorticoid receptor blocker with spironolactone  Close follow up renal function and electrolytes as outpatient

## 2023-09-26 NOTE — ED Provider Notes (Signed)
 [place] EMERGENCY DEPARTMENT AT [facility] Provider Note  CSN: [id number] Arrival date & time: 09/26/23 1402  Chief Complaint(s) AICD Problem (Pt. Was notified by Dr. [name] and Dr. [name] that they received a report that defibrillator sensed heart having irregular rhythmn)  HPI Jason Russell a 73 y.o. male with past medical history as below, significant for A-fib on Xarelto /amiodarone , COPD, systolic heart failure, AICD [facility] Jude ischemic cardiomyopathy, ascending aortic aneurysm who presents to the ED with complaint of AICD activation  Patient with activation of his AICD with ATP earlier today  Per cardiology nurse triage: 4 VT-2 classified events 6/21 @ 19:37 & 19:44, HR's 203-210.  EGM's c/w irregular R-R with periods of regularity.  Likely AF with RVR, ATP delivered x1-2 slowing rate below  VT-2 zone - route to triage high alert 33 NSVT events also irregular R-R, hx of AF, Xarelto  per EPIC, significant increase in daily HR trends HF diagnostics currenlty abnormal.   Patient reports has been feeling unwell for the past 5 days, chills, body aches, cough occasionally productive with blood-streaked sputum.  Exertional dyspnea.  Feels he Russell gaining weight has not been checking his weights.  He Russell compliant with home medications.  No sick contact.  Feels that symptoms have improved over the past 48 hours but are still present   Past Medical History Past Medical History:  Diagnosis Date   Adenocarcinoma of right lung, stage 3 (HCC) 08/23/2016   Anemia    Arthritis    hx right hip   Asthma    when I was a child   Atrial fibrillation (HCC)    Amiodarone  started 10/2011; Coumadin    Automatic implantable cardioverter-defibrillator in situ 10/03/2012   a. St. Jude ICD implantation 10/03/12.   Chronic anticoagulation    Chronic fatigue 10/18/2016   Chronic systolic heart failure (HCC)    a. Echo 7/13: EF 25%;  b. echo 04/2012:  Mild LVH, EF 30-35%, Gr 1 DD, mild  AI, mild MR, mild LAE   COPD (chronic obstructive pulmonary disease) (HCC)    Dyslipidemia    Gout    History of blood transfusion 10/15/2013   don't know where the blood's going; HgB down to 5   Hyperlipidemia    Hypertension    Hypothyroidism    NICM (nonischemic cardiomyopathy) (HCC)    LHC 4/14:  minimal CAD   Obesity    OSA on CPAP    Thoracic ascending aortic aneurysm (HCC) 07/25/2023   CT 07/25/23: TAA 4.1 cm; 7 mm LUL nodule    Tobacco abuse    Patient Active Problem List   Diagnosis Date Noted   Thoracic ascending aortic aneurysm (HCC) 07/25/2023   VT (ventricular tachycardia) (HCC) 08/19/2021   ICD (implantable cardioverter-defibrillator) in place 08/19/2021   Prerenal azotemia 06/13/2021   Benign hypertension with chronic kidney disease, stage III (HCC) 12/14/2020   Status post right shoulder hemiarthroplasty 09/22/2020   AKI (acute kidney injury) (HCC) 08/11/2020   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   ICD (implantable cardioverter-defibrillator) malfunction 02/13/2018   Lung nodule < 6cm on CT 01/11/2017   Mediastinal adenopathy 01/11/2017   CHF (congestive heart failure) (HCC) 01/11/2017   Depression 01/11/2017   Weight loss, non-intentional 01/08/2017   Defibrillator discharge 01/08/2017   Chronic fatigue 10/18/2016   Port catheter in place 09/25/2016   Encounter for antineoplastic chemotherapy 08/24/2016   Goals of care, counseling/discussion 08/24/2016   Primary cancer of right upper lobe of lung (HCC) 08/23/2016   Lung nodule  07/21/2016   Right rotator cuff tear 06/05/2016   Clotting disorder (HCC) 10/07/2015   COPD (chronic obstructive pulmonary disease) (HCC) 10/07/2015   Metabolic bone disease 08/02/2015   Vitamin D  deficiency 08/02/2015   Anemia 10/29/2013   Internal hemorrhoids 10/17/2013   Iron  deficiency anemia 10/16/2013   Symptomatic anemia 10/15/2013   Coronary atherosclerosis 10/04/2012   Abnormal glucose 09/07/2012   Allergy 09/07/2012    Gout 09/07/2012   Psychosexual dysfunction with inhibited sexual excitement 09/07/2012   Primary cardiomyopathy (HCC) 08/16/2012   Shortness of breath dyspnea 11/01/2011   Chronic anticoagulation 11/01/2011   Chronic systolic heart failure (HCC) 11/01/2011   Exertional dyspnea 11/01/2011   Encounter for current long-term use of anticoagulants 11/01/2011   Obstructive sleep apnea (adult) (pediatric) 12/20/2009   Atrial fibrillation (HCC) 09/24/2009   Hypokalemia 07/26/2009   HYPOPOTASSEMIA 07/26/2009   Hypercholesterolemia 07/15/2009   Overweight 07/15/2009   TOBACCO ABUSE 07/15/2009   Hypertension, benign 07/15/2009   Overweight 07/15/2009   Home Medication(s) Prior to Admission medications   Medication Sig Start Date End Date Taking? Authorizing Provider  albuterol  (VENTOLIN  HFA) 108 (90 Base) MCG/ACT inhaler Inhale 2 puffs into the lungs every 4 (four) hours as needed for wheezing or shortness of breath. 02/27/23   Parrett, Madelin RAMAN, NP  amiodarone  (PACERONE ) 200 MG tablet Take 1 tablet (200 mg total) by mouth daily. 08/24/23   [name] Danelle ORN, MD  atorvastatin  (LIPITOR) 40 MG tablet TAKE 1 TABLET BY MOUTH EVERY DAY 11/28/22   [name] Vina GAILS, MD  Bempedoic Acid-Ezetimibe (NEXLIZET ) 180-10 MG TABS Take 1 tablet by mouth daily in the afternoon. 12/18/22   [name] Vina GAILS, MD  bisoprolol  (ZEBETA ) 5 MG tablet TAKE 1 TABLET (5 MG TOTAL) BY MOUTH DAILY. 12/27/22   Lesia Ozell Barter, PA-C  [employer] D3 2000 units CAPS Take 2,000 Units by mouth daily.  11/02/16   [provider]  dapagliflozin  propanediol (FARXIGA ) 10 MG TABS tablet Take 1 tablet (10 mg total) by mouth daily before breakfast. 12/18/22   [name] Vina GAILS, MD  ferrous gluconate  [name]) 324 MG tablet TAKE 1 TABLET BY MOUTH THREE TIMES A DAY WITH MEALS 01/16/23   [name] Vina GAILS, MD  furosemide  (LASIX ) 40 MG tablet Take one tablet (40 mg) by mouth daily with an extra tablet (80 mg total) twice a week only. 01/22/23   [name] Vina GAILS,  MD  JARDIANCE 10 MG TABS tablet Take 10 mg by mouth daily.    [provider]  levothyroxine  (SYNTHROID ) 50 MCG tablet TAKE 1 TABLET BY MOUTH EVERY DAY BEFORE BREAKFAST 04/27/23   [name] Vina GAILS, MD  magnesium  oxide (MAG-OX) 400 (241.3 Mg) MG tablet Take 1 tablet (400 mg total) by mouth daily. 01/10/17   Vann, Jessica U, DO  Polyvinyl Alcohol -Povidone (REFRESH OP) Place 1 drop into both eyes every morning.    [provider]  Potassium Chloride  ER 20 MEQ TBCR TAKE 1 TABLET BY MOUTH EVERY DAY 02/26/23   [name] Vina GAILS, MD  sacubitril -valsartan  (ENTRESTO ) 97-103 MG Take 1 tablet by mouth 2 (two) times daily. 12/12/22   [name] Vina GAILS, MD  sildenafil  (VIAGRA ) 100 MG tablet TAKE 1 TABLET BY MOUTH EVERY DAY AS NEEDED FOR ERECTILE DYSFUNCTION 03/22/23   [name] Danelle ORN, MD  spironolactone  (ALDACTONE ) 25 MG tablet TAKE 1 TABLET (25 MG TOTAL) BY MOUTH DAILY. 04/23/23   [name] Danelle ORN, MD  TRELEGY ELLIPTA  100-62.5-25 MCG/ACT AEPB INHALE 1 PUFF BY MOUTH EVERY DAY 09/24/23  Parrett, Tammy S, NP  ULORIC  40 MG tablet Take 1 tablet (40 mg total) by mouth daily. 10/17/16   Fernande Elspeth BROCKS, MD  XARELTO  20 MG TABS tablet TAKE 1 TABLET BY MOUTH EVERY DAY 10/02/22   [name] Danelle ORN, MD                                                                                                                                    Past Surgical History Past Surgical History:  Procedure Laterality Date   CARDIAC DEFIBRILLATOR PLACEMENT  2014   CARDIOVERSION  2011   CARDIOVERSION N/A 03/15/2020   Procedure: CARDIOVERSION;  Surgeon: [name] Vina GAILS, MD;  Location: [facility] ENDOSCOPY;  Service: Cardiovascular;  Laterality: N/A;   COLONOSCOPY WITH PROPOFOL  Left 10/17/2013   Procedure: COLONOSCOPY WITH PROPOFOL ;  Surgeon: Lamar JONETTA Aho, MD;  Location: [facility] ENDOSCOPY;  Service: Endoscopy;  Laterality: Left;   ESOPHAGOGASTRODUODENOSCOPY N/A 10/17/2013   Procedure: ESOPHAGOGASTRODUODENOSCOPY (EGD);  Surgeon: Lamar JONETTA Aho, MD;   Location: [facility] ENDOSCOPY;  Service: Endoscopy;  Laterality: N/A;   ESOPHAGOGASTRODUODENOSCOPY (EGD) WITH PROPOFOL  N/A 06/14/2018   Procedure: ESOPHAGOGASTRODUODENOSCOPY (EGD) WITH PROPOFOL ;  Surgeon: Legrand Victory LITTIE DOUGLAS, MD;  Location: [facility] ENDOSCOPY;  Service: Gastroenterology;  Laterality: N/A;   GIVENS CAPSULE STUDY N/A 10/29/2013   Procedure: GIVENS CAPSULE STUDY;  Surgeon: Lamar JONETTA Aho, MD;  Location: [facility] ENDOSCOPY;  Service: Endoscopy;  Laterality: N/A;   ICD GENERATOR CHANGEOUT N/A 02/13/2018   Procedure: ICD GENERATOR CHANGEOUT;  Surgeon: Inocencio Soyla Lunger, MD;  Location[facility] INVASIVE CV LAB;  Service: Cardiovascular;  Laterality: N/A;   IMPLANTABLE CARDIOVERTER DEFIBRILLATOR IMPLANT Left 10/03/2012   Procedure: IMPLANTABLE CARDIOVERTER DEFIBRILLATOR IMPLANT;  Surgeon: Elspeth BROCKS Fernande, MD;  Location: [facility] CATH LAB;  Service: Cardiovascular;  Laterality: Left;   IR FLUORO GUIDE PORT INSERTION RIGHT  09/21/2016   IR US  GUIDE VASC ACCESS RIGHT  09/21/2016   JOINT REPLACEMENT     REVERSE SHOULDER ARTHROPLASTY Right 09/22/2020   Procedure: SHOULDER HEMI ARTHROPLASTY;  Surgeon: Cristy Bonner DASEN, MD;  Location: [facility] ORS;  Service: Orthopedics;  Laterality: Right;   SAVORY DILATION N/A 06/14/2018   Procedure: SAVORY DILATION;  Surgeon: Legrand Victory LITTIE DOUGLAS, MD;  Location: [facility] ENDOSCOPY;  Service: Gastroenterology;  Laterality: N/A;   TONSILLECTOMY  1950's   TOTAL HIP ARTHROPLASTY Right 11/25/1997   V TACH ABLATION N/A 09/15/2021   Procedure: V TACH ABLATION;  Surgeon: [name] Danelle ORN, MD;  Location: [facility] INVASIVE CV LAB;  Service: Cardiovascular;  Laterality: N/A;   VIDEO BRONCHOSCOPY WITH ENDOBRONCHIAL ULTRASOUND N/A 08/09/2016   Procedure: VIDEO BRONCHOSCOPY WITH ENDOBRONCHIAL ULTRASOUND;  Surgeon: Noreen Tonnie BRAVO, MD;  Location: [facility] OR;  Service: Thoracic;  Laterality: N/A;   Family History Family History  Problem Relation Age of Onset   Hypertension Mother    Heart Problems Mother    Other Father         deceased- unknow reason  Lung cancer Brother     Social History Social History   Tobacco Use   Smoking status: Former    Current packs/day: 0.00    Average packs/day: 0.3 packs/day for 45.0 years (11.3 ttl pk-yrs)    Types: Cigarettes    Start date: 07/12/1971    Quit date: 07/11/2016    Years since quitting: 7.2   Smokeless tobacco: Never  Vaping Use   Vaping status: Never Used  Substance Use Topics   Alcohol  use: Yes    Alcohol /week: 1.0 standard drink of alcohol     Types: 1 Cans of beer per week    Comment: seldom   Drug use: Never   Allergies Patient has no known allergies.  Review of Systems A thorough review of systems was obtained and all systems are negative except as noted in the HPI and PMH.   Physical Exam Vital Signs  I have reviewed the triage vital signs Ht 6' 1 (1.854 m)   Wt (!) 142 kg   BMI 41.30 kg/m  Physical Exam Vitals and nursing note reviewed.  Constitutional:      General: He Russell not in acute distress.    Appearance: He Russell well-developed. He Russell obese.  HENT:     Head: Normocephalic and atraumatic.     Right Ear: External ear normal.     Left Ear: External ear normal.     Mouth/Throat:     Mouth: Mucous membranes are moist.   Eyes:     General: No scleral icterus.   Cardiovascular:     Rate and Rhythm: Normal rate. Rhythm irregular.     Pulses: Normal pulses.     Heart sounds: Normal heart sounds.  Pulmonary:     Effort: Pulmonary effort Russell normal. No respiratory distress.     Breath sounds: Normal breath sounds.  Abdominal:     General: Abdomen Russell flat.     Palpations: Abdomen Russell soft.     Tenderness: There Russell no abdominal tenderness.   Musculoskeletal:     Cervical back: No rigidity.     Right lower leg: Edema present.     Left lower leg: Edema present.   Skin:    General: Skin Russell warm and dry.     Capillary Refill: Capillary refill takes less than 2 seconds.   Neurological:     Mental Status: He Russell alert.    Psychiatric:        Mood and Affect: Mood normal.        Behavior: Behavior normal.     ED Results and Treatments Labs (all labs ordered are listed, but only abnormal results are displayed) Labs Reviewed  CBC WITH DIFFERENTIAL/PLATELET - Abnormal; Notable for the following components:      Result Value   RBC 3.84 (*)    Hemoglobin 11.5 (*)    HCT 36.9 (*)    Lymphs Abs 0.5 (*)    Monocytes Absolute 1.2 (*)    All other components within normal limits  BASIC METABOLIC PANEL WITH GFR  BRAIN NATRIURETIC PEPTIDE  PROTIME-INR  Radiology No results found.  Pertinent labs & imaging results that were available during my care of the patient were reviewed by me and considered in my medical decision making (see MDM for details).  Medications Ordered in ED Medications - No data to display                                                                                                                                   Procedures .Critical Care  Performed by: Elnor Jayson LABOR, DO Authorized by: Elnor Jayson LABOR, DO   Critical care provider statement:    Critical care time (minutes):  35   Critical care time was exclusive of:  Separately billable procedures and treating other patients   Critical care was necessary to treat or prevent imminent or life-threatening deterioration of the following conditions:  Cardiac failure   Critical care was time spent personally by me on the following activities:  Development of treatment plan with patient or surrogate, discussions with consultants, evaluation of patient's response to treatment, examination of patient, ordering and review of laboratory studies, ordering and review of radiographic studies, ordering and performing treatments and interventions, pulse oximetry, re-evaluation of patient's condition, review of old charts and  obtaining history from patient or surrogate   Care discussed with: admitting provider     (including critical care time)  Medical Decision Making / ED Course    Medical Decision Making:    Jason Russell a 73 y.o. male with past medical history as below, significant for A-fib on Xarelto /amiodarone , COPD, systolic heart failure, AICD [facility] Jude ischemic cardiomyopathy, ascending aortic aneurysm who presents to the ED with complaint of AICD activation. The complaint involves an extensive differential diagnosis and also carries with it a high risk of complications and morbidity.  Serious etiology was considered. Ddx includes but Russell not limited to: In my evaluation of this patient's dyspnea my DDx includes, but Russell not limited to, pneumonia, pulmonary embolism, pneumothorax, pulmonary edema, metabolic acidosis, asthma, COPD, cardiac cause, anemia, anxiety, etc.    Complete initial physical exam performed, notably the patient was in no acute distress, no hypoxia.    Reviewed and confirmed nursing documentation for past medical history, family history, social history.  Vital signs reviewed.     Brief summary:  73 year old male with history as above here with dyspnea, cough, AICD activation   Clinical Course as of 09/26/23 1703  Wed Sep 26, 2023  1531 Creatinine(!): 2.01 Similar to prior [SG]  1546 B Natriuretic Peptide(!): 468.9 Elev from prior, he Russell compliant w/ lasix  per pt, unsure weight gain [SG]  1653 BNP Russell elev, he has exertional dib, trace LE edema, feels he has gained weight but unsure. Will give lasix   Will consult cardiology [SG]  1658 Spoke w/ Dr Court, recommends hospitalist admit and EP see tomorrow  [SG]    Clinical Course User Index [SG] Elnor Jayson LABOR, DO  He appears volume overloaded, will start diuresis for CHF exacerbation He has abnormality to the wiring on his AICD on x-ray. Spoke with Dr. Court, recommend EP consult morning, admit to  hospitalist Spoke with hospitalist who accepts patient admission            Additional history obtained: -Additional history obtained from fiancee -External records from outside source obtained and reviewed including: Chart review including previous notes, labs, imaging, consultation notes including  ***   Lab Tests: -I ordered, reviewed, and interpreted labs.   The pertinent results include:   Labs Reviewed  CBC WITH DIFFERENTIAL/PLATELET - Abnormal; Notable for the following components:      Result Value   RBC 3.84 (*)    Hemoglobin 11.5 (*)    HCT 36.9 (*)    Lymphs Abs 0.5 (*)    Monocytes Absolute 1.2 (*)    All other components within normal limits  BASIC METABOLIC PANEL WITH GFR  BRAIN NATRIURETIC PEPTIDE  PROTIME-INR    Notable for ***  EKG   EKG Interpretation Date/Time:    Ventricular Rate:    PR Interval:    QRS Duration:    QT Interval:    QTC Calculation:   R Axis:      Text Interpretation:           Imaging Studies ordered: I ordered imaging studies including *** I independently visualized the following imaging with scope of interpretation limited to determining acute life threatening conditions related to emergency care; findings noted above I agree with the radiologist interpretation If any imaging was obtained with contrast I closely monitored patient for any possible adverse reaction a/w contrast administration in the emergency department   Medicines ordered and prescription drug management: No orders of the defined types were placed in this encounter.   -I have reviewed the patients home medicines and have made adjustments as needed   Consultations Obtained: I requested consultation with the ***,  and discussed lab and imaging findings as well as pertinent plan - they recommend: ***   Cardiac Monitoring: The patient was maintained on a cardiac monitor.  I personally viewed and interpreted the cardiac monitored which showed an  underlying rhythm of: *** Continuous pulse oximetry interpreted by myself, ***% on ***.    Social Determinants of Health:  Diagnosis or treatment significantly limited by social determinants of health: {wssoc:28071}   Reevaluation: After the interventions noted above, I reevaluated the patient and found that they have {resolved/improved/worsened:23923::improved}  Co morbidities that complicate the patient evaluation  Past Medical History:  Diagnosis Date   Adenocarcinoma of right lung, stage 3 (HCC) 08/23/2016   Anemia    Arthritis    hx right hip   Asthma    when I was a child   Atrial fibrillation (HCC)    Amiodarone  started 10/2011; Coumadin    Automatic implantable cardioverter-defibrillator in situ 10/03/2012   a. St. Jude ICD implantation 10/03/12.   Chronic anticoagulation    Chronic fatigue 10/18/2016   Chronic systolic heart failure (HCC)    a. Echo 7/13: EF 25%;  b. echo 04/2012:  Mild LVH, EF 30-35%, Gr 1 DD, mild AI, mild MR, mild LAE   COPD (chronic obstructive pulmonary disease) (HCC)    Dyslipidemia    Gout    History of blood transfusion 10/15/2013   don't know where the blood's going; HgB down to 5   Hyperlipidemia    Hypertension    Hypothyroidism    NICM (nonischemic cardiomyopathy) (HCC)  LHC 4/14:  minimal CAD   Obesity    OSA on CPAP    Thoracic ascending aortic aneurysm (HCC) 07/25/2023   CT 07/25/23: TAA 4.1 cm; 7 mm LUL nodule    Tobacco abuse       Dispostion: Disposition decision including need for hospitalization was considered, and patient {wsdispo:28070::discharged from emergency department.}    Final Clinical Impression(s) / ED Diagnoses Final diagnoses:  None

## 2023-09-26 NOTE — Assessment & Plan Note (Signed)
No signs of acute exacerbation. Continue bronchodilator therapy.

## 2023-09-26 NOTE — ED Triage Notes (Signed)
 Per Cardiology noted.  Pt. Having palpitations. Pt. Has a  history of VT and ICD sent a message that patient had 2 runs of VT.   (Per note)   Pt. Reports that he was sick since Tuesday with resp. Symptoms, cough congestion, sob.

## 2023-09-26 NOTE — Assessment & Plan Note (Signed)
Cell count stable.  

## 2023-09-26 NOTE — ED Provider Triage Note (Signed)
 Emergency Medicine Provider Triage Evaluation Note  Jason Russell , a 73 y.o. male  was evaluated in triage.  Pt complains of shortness of breath ongoing since 6 days ago.  His defibrillator recorded incident.  Dr. [name], Dr. [name] were notified instructed to return to the ED. Recent viral illness but reports improvement in symptoms.  4 VT-2 classified events 6/21 @ 19:37 & 19:44, HR's 203-210.  EGM's c/w irregular R-R with periods of regularity.  Likely AF with RVR, ATP delivered x1-2 slowing rate below       Review of Systems  Positive: Sob, fatigue Negative: fever  Physical Exam  There were no vitals taken for this visit. Gen:   Awake, no distress   Resp:  Normal effort  MSK:   Moves extremities without difficulty  Other:    Medical Decision Making  Medically screening exam initiated at 2:21 PM.  Appropriate orders placed.  Jason Russell was informed that the remainder of the evaluation will be completed by another provider, this initial triage assessment does not replace that evaluation, and the importance of remaining in the ED until their evaluation is complete.     [name], PA-C 09/26/23 1425

## 2023-09-26 NOTE — Assessment & Plan Note (Signed)
 Continue levothyroxine 

## 2023-09-26 NOTE — Assessment & Plan Note (Signed)
 Calculated BMI is 41.3

## 2023-09-27 ENCOUNTER — Encounter (HOSPITAL_COMMUNITY): Payer: Self-pay | Admitting: Internal Medicine

## 2023-09-27 DIAGNOSIS — I480 Paroxysmal atrial fibrillation: Secondary | ICD-10-CM | POA: Diagnosis not present

## 2023-09-27 DIAGNOSIS — I5023 Acute on chronic systolic (congestive) heart failure: Secondary | ICD-10-CM | POA: Diagnosis not present

## 2023-09-27 DIAGNOSIS — I4891 Unspecified atrial fibrillation: Secondary | ICD-10-CM

## 2023-09-27 DIAGNOSIS — J430 Unilateral pulmonary emphysema [MacLeod's syndrome]: Secondary | ICD-10-CM | POA: Diagnosis not present

## 2023-09-27 DIAGNOSIS — D509 Iron deficiency anemia, unspecified: Secondary | ICD-10-CM | POA: Diagnosis not present

## 2023-09-27 DIAGNOSIS — I10 Essential (primary) hypertension: Secondary | ICD-10-CM | POA: Diagnosis not present

## 2023-09-27 DIAGNOSIS — N1832 Chronic kidney disease, stage 3b: Secondary | ICD-10-CM | POA: Diagnosis not present

## 2023-09-27 LAB — BASIC METABOLIC PANEL WITH GFR
Anion gap: 11 (ref 5–15)
BUN: 21 mg/dL (ref 8–23)
CO2: 22 mmol/L (ref 22–32)
Calcium: 8.8 mg/dL — ABNORMAL LOW (ref 8.9–10.3)
Chloride: 107 mmol/L (ref 98–111)
Creatinine, Ser: 2.08 mg/dL — ABNORMAL HIGH (ref 0.61–1.24)
GFR, Estimated: 33 mL/min — ABNORMAL LOW (ref >60)
Glucose, Bld: 138 mg/dL — ABNORMAL HIGH (ref 70–99)
Potassium: 3.2 mmol/L — ABNORMAL LOW (ref 3.5–5.1)
Sodium: 140 mmol/L (ref 135–145)

## 2023-09-27 LAB — CBC
HCT: 38.3 % — ABNORMAL LOW (ref 39.0–52.0)
Hemoglobin: 12 g/dL — ABNORMAL LOW (ref 13.0–17.0)
MCH: 29.9 pg (ref 26.0–34.0)
MCHC: 31.3 g/dL (ref 30.0–36.0)
MCV: 95.5 fL (ref 80.0–100.0)
Platelets: 201 10*3/uL (ref 150–400)
RBC: 4.01 MIL/uL — ABNORMAL LOW (ref 4.22–5.81)
RDW: 14.7 % (ref 11.5–15.5)
WBC: 10 10*3/uL (ref 4.0–10.5)
nRBC: 0.2 % (ref 0.0–0.2)

## 2023-09-27 LAB — MAGNESIUM: Magnesium: 2 mg/dL (ref 1.7–2.4)

## 2023-09-27 MED ORDER — SACUBITRIL-VALSARTAN 97-103 MG PO TABS
1.0000 | ORAL_TABLET | Freq: Two times a day (BID) | ORAL | Status: DC
  Administered 2023-09-27 – 2023-09-28 (×3): 1 via ORAL
  Filled 2023-09-27 (×4): qty 1

## 2023-09-27 MED ORDER — ACETAMINOPHEN 650 MG RE SUPP: 650.0000 mg | Freq: Four times a day (QID) | RECTAL | Status: DC | PRN

## 2023-09-27 MED ORDER — FEBUXOSTAT 40 MG PO TABS
40.0000 mg | ORAL_TABLET | Freq: Every day | ORAL | Status: DC
  Administered 2023-09-27 – 2023-09-28 (×2): 40 mg via ORAL
  Filled 2023-09-27 (×2): qty 1

## 2023-09-27 MED ORDER — DAPAGLIFLOZIN PROPANEDIOL 10 MG PO TABS
10.0000 mg | ORAL_TABLET | Freq: Every day | ORAL | Status: DC
  Administered 2023-09-27: 10 mg via ORAL
  Filled 2023-09-27: qty 1

## 2023-09-27 MED ORDER — ONDANSETRON HCL 4 MG PO TABS
4.0000 mg | ORAL_TABLET | Freq: Four times a day (QID) | ORAL | Status: DC | PRN
Start: 2023-09-27 — End: 2023-09-28

## 2023-09-27 MED ORDER — ATORVASTATIN CALCIUM 40 MG PO TABS
40.0000 mg | ORAL_TABLET | Freq: Every day | ORAL | Status: DC
  Administered 2023-09-27 – 2023-09-28 (×2): 40 mg via ORAL
  Filled 2023-09-27 (×2): qty 1

## 2023-09-27 MED ORDER — POTASSIUM CHLORIDE CRYS ER 20 MEQ PO TBCR
40.0000 meq | EXTENDED_RELEASE_TABLET | Freq: Once | ORAL | Status: AC
  Administered 2023-09-27: 40 meq via ORAL
  Filled 2023-09-27: qty 2

## 2023-09-27 MED ORDER — FUROSEMIDE 10 MG/ML IJ SOLN: 60.0000 mg | Freq: Two times a day (BID) | INTRAMUSCULAR | Status: DC

## 2023-09-27 MED ORDER — RIVAROXABAN 20 MG PO TABS
20.0000 mg | ORAL_TABLET | Freq: Every day | ORAL | Status: DC
  Administered 2023-09-27 – 2023-09-28 (×2): 20 mg via ORAL
  Filled 2023-09-27: qty 1
  Filled 2023-09-27: qty 2

## 2023-09-27 MED ORDER — AMIODARONE HCL 200 MG PO TABS
200.0000 mg | ORAL_TABLET | Freq: Every day | ORAL | Status: DC
  Administered 2023-09-27 – 2023-09-28 (×2): 200 mg via ORAL
  Filled 2023-09-27 (×2): qty 1

## 2023-09-27 MED ORDER — BISOPROLOL FUMARATE 5 MG PO TABS
5.0000 mg | ORAL_TABLET | Freq: Every day | ORAL | Status: DC
  Administered 2023-09-27 – 2023-09-28 (×2): 5 mg via ORAL
  Filled 2023-09-27 (×2): qty 1

## 2023-09-27 MED ORDER — LEVOTHYROXINE SODIUM 50 MCG PO TABS
50.0000 ug | ORAL_TABLET | Freq: Every day | ORAL | Status: DC
  Administered 2023-09-27 – 2023-09-28 (×2): 50 ug via ORAL
  Filled 2023-09-27 (×3): qty 1

## 2023-09-27 MED ORDER — GUAIFENESIN-DM 100-10 MG/5ML PO SYRP
5.0000 mL | ORAL_SOLUTION | ORAL | Status: DC | PRN
  Administered 2023-09-28: 5 mL via ORAL
  Filled 2023-09-27: qty 5

## 2023-09-27 MED ORDER — BUDESON-GLYCOPYRROL-FORMOTEROL 160-9-4.8 MCG/ACT IN AERO
2.0000 | INHALATION_SPRAY | Freq: Two times a day (BID) | RESPIRATORY_TRACT | Status: DC
  Administered 2023-09-27 – 2023-09-28 (×2): 2 via RESPIRATORY_TRACT
  Filled 2023-09-27 (×2): qty 5.9

## 2023-09-27 MED ORDER — EMPAGLIFLOZIN 10 MG PO TABS
10.0000 mg | ORAL_TABLET | Freq: Every day | ORAL | Status: DC
  Administered 2023-09-28: 10 mg via ORAL
  Filled 2023-09-27: qty 1

## 2023-09-27 MED ORDER — ACETAMINOPHEN 325 MG PO TABS
650.0000 mg | ORAL_TABLET | Freq: Four times a day (QID) | ORAL | Status: DC | PRN
Start: 2023-09-27 — End: 2023-09-28

## 2023-09-27 MED ORDER — ONDANSETRON HCL 4 MG/2ML IJ SOLN: 4.0000 mg | Freq: Four times a day (QID) | INTRAMUSCULAR | Status: DC | PRN

## 2023-09-27 MED ORDER — SPIRONOLACTONE 25 MG PO TABS
25.0000 mg | ORAL_TABLET | Freq: Every day | ORAL | Status: DC
  Administered 2023-09-27 – 2023-09-28 (×2): 25 mg via ORAL
  Filled 2023-09-27 (×2): qty 1

## 2023-09-27 MED ORDER — FERROUS GLUCONATE 324 (38 FE) MG PO TABS
324.0000 mg | ORAL_TABLET | Freq: Every day | ORAL | Status: DC
  Administered 2023-09-27 – 2023-09-28 (×2): 324 mg via ORAL
  Filled 2023-09-27 (×2): qty 1

## 2023-09-27 MED ORDER — FUROSEMIDE 10 MG/ML IJ SOLN
60.0000 mg | Freq: Two times a day (BID) | INTRAMUSCULAR | Status: DC
  Administered 2023-09-27 – 2023-09-28 (×3): 60 mg via INTRAVENOUS
  Filled 2023-09-27 (×3): qty 6

## 2023-09-27 MED ORDER — POTASSIUM CHLORIDE CRYS ER 20 MEQ PO TBCR: 40.0000 meq | EXTENDED_RELEASE_TABLET | Freq: Once | ORAL | Status: DC

## 2023-09-27 NOTE — Progress Notes (Signed)
 Progress Note   Patient: Jason Russell FMW:[id number] DOB: [birth year] DOA: 09/26/2023     1 DOS: the patient was seen and examined on 09/27/2023   Brief hospital course: Jason Russell was admitted to the hospital with the working diagnosis of heart failure exacerbation.   73 y.o. male with medical history significant of atrial fibrillation, heart failure, sp AICD, COPD, dyslipidemia, gout, OSA and obesity who presented with dyspnea. Reported 6 days of worsening dyspnea. Progressive worsening symptoms to the point where he was having dyspnea with minimal efforts.  Positive PND. He was called by his cardiologist due to detected irregular heart rhythm through his AICD, advising him to come to the hospital.  Noted report from June 24 ICD, with multiple episodes of non sustained ventricular tachycardia.  On his initial physical examination his blood pressure was 143/61, HR 62, RR 28 and 02 saturation 100% on supplemental 02 per [place]. Cardiovascular with S1 and S2 present, irregular with no gallops, rubs or murmurs Positive moderate JVD Respiratory with rales at bases with no wheezing or rhonchi  Abdomen protuberant but non tender and not distended Positive lower extremity edema ++  Na 142, K 4,1 Cl 112 bicarbonate 18 glucose 100 bun 19 cr 2,0  BNP 468  High sensitive troponin 40  Wbc 8,3 hgb 11.5 plt 194  INR 2,7    Chest radiograph with cardiomegaly, bilateral hilar vascular congestion, no effusions, defibrillator in place with one single right ventricular lead, kinked at the level of the clavicle.    EKG 55 bpm, left axis deviation, right bundle branch block, qtc 473, sinus rhythm with PAC, no significant ST segment or  T wave changes  Assessment and Plan: * Acute on chronic systolic CHF (congestive heart failure) (HCC) 07/2023 echocardiogram with reduced LV systolic function 35 to 40%, global hypokinesis, moderate to severe dilatation of internal cavity, mild LVH, RV systolic function  preserved, LA with moderate dilatation, RA with mild dilatation, RVSP 22,4 mmHg, moderate mitral valve regurgitation, moderate aortic valve regurgitation. Severe aortic root dilatation.   Volume status is improving.  Plan to  continue diuresis with furosemide  60 mg IV bid Continue with SGLT 2 inh, spironolactone  and entresto . Continue bisoprolol .  Strict in and out and daily weight.    Hypertension, benign Continue blood pressure control with entresto , bisoprolol  and spironolactone    Atrial fibrillation (HCC) Continue amiodarone  and bisoprolol  for rate and rhythm control  Anticoagulation with rivaroxaban .   ICD detected ventricular tachycardia, non sustained.  Continue diuresis.  Replete K to target of 4 and keep Mg above 2  Kinked lead of defibrillator with no evidence of fracture, will follow up with EP recommendations  Telemetry with positive PAC   CKD stage 3b, GFR 30-44 ml/min (HCC) Hypokalemia.   Renal function with stable serum cr at 2,0, K is 3.2 and bicarbonate 22  Na 140 and mg 2.   Add 40 meq Kcl x 2 doses  Continue diuresis with furosemide , to keep negative fluid balance Continue SGLT 2 inh and spironolactone    Iron  deficiency anemia Cell count stable.   Gout No acute flare   COPD (chronic obstructive pulmonary disease) (HCC) No signs of acute exacerbation Continue bronchodilator therapy   Hypothyroidism Continue levothyroxine    Primary cancer of right upper lobe of lung (HCC) Follow up as outpatient [place] in right internal jugular vein   Obesity, class 3 Calculated BMI is 41.3      Subjective: patient is feeling better, dyspnea and edema are improving, not yet  back to baseline, no palpitations and no chest pain   Physical Exam: Vitals:   09/27/23 0033 09/27/23 0626 09/27/23 0930 09/27/23 1031  BP: (!) 150/86 113/71 (!) 134/58   Pulse: 82 94 78   Resp:  (!) 23 (!) 22   Temp: 98 F (36.7 C)   98.1 F (36.7 C)  TempSrc: Oral   Oral  SpO2:  98% 96% 100%   Weight:      Height:       Neurology awake and alert ENT with mild pallor Cardiovascular with S1 and S2 present and regular with extra beats, positive systolic murmur at the apex No JVD Positive lower extremity edema +  Respiratory with no wheezing or rhonchi, positive rales at bases Abdomen with no distention   Data Reviewed:    Family Communication: no family at the bedside   Disposition: Status is: Inpatient Remains inpatient appropriate because: IV diuresis   Planned Discharge Destination: Home    Author: Elidia Toribio Furnace, MD 09/27/2023 11:19 AM  For on call review [email or web address].

## 2023-09-27 NOTE — Progress Notes (Signed)
 Heart Failure Nurse Navigator Progress Note  PCP: [name], MD PCP-Cardiologist: [name] Admission Diagnosis: Atrial fibrillation, acute on chronic congestive heart failure.  Admitted from: Home  Presentation:   Jason Russell presented with abnormal heart rhythm after he was notified from his doctor that his defibrillator sensed the rhythm and sent a message to Dr. [name]. He reports shortness of breath with exertion and recently getting over a cold. BP 143/61, HR 62, BNP 468.9, Troponin 40, CXR with cardiomegaly, bilateral hilar vascular congestion, no effusions, defibrillator in place with one single right ventricular lead, kinked at the level of the clavicle.   Patient was educated on the sign and symptoms of heart failure, daily weights, when to call his doctor or go to the ED. Diet/ fluid restrictions, taking all medications as prescribed and attending all medical appointments. Patient verbalized his understanding of all education, a HF TOC appointment was scheduled for 10/04/2023 @ 2:45 pm.   ECHO/ LVEF: 35-40%  Clinical Course:  Past Medical History:  Diagnosis Date   Adenocarcinoma of right lung, stage 3 (HCC) 08/23/2016   Anemia    Arthritis    hx right hip   Asthma    when I was a child   Atrial fibrillation (HCC)    Amiodarone  started 10/2011; Coumadin    Automatic implantable cardioverter-defibrillator in situ 10/03/2012   a. St. Jude ICD implantation 10/03/12.   Chronic anticoagulation    Chronic fatigue 10/18/2016   Chronic systolic heart failure (HCC)    a. Echo 7/13: EF 25%;  b. echo 04/2012:  Mild LVH, EF 30-35%, Gr 1 DD, mild AI, mild MR, mild LAE   COPD (chronic obstructive pulmonary disease) (HCC)    Dyslipidemia    Gout    History of blood transfusion 10/15/2013   don't know where the blood's going; HgB down to 5   Hyperlipidemia    Hypertension    Hypothyroidism    NICM (nonischemic cardiomyopathy) (HCC)    LHC 4/14:  minimal CAD   Obesity    OSA  on CPAP    Thoracic ascending aortic aneurysm (HCC) 07/25/2023   CT 07/25/23: TAA 4.1 cm; 7 mm LUL nodule    Tobacco abuse      Social History   Socioeconomic History   Marital status: Widowed    Spouse name: Not on file   Number of children: 1   Years of education: Not on file   Highest education level: Not on file  Occupational History   Occupation: retired    Associate Professor: [employer]  Tobacco Use   Smoking status: Former    Current packs/day: 0.00    Average packs/day: 0.3 packs/day for 45.0 years (11.3 ttl pk-yrs)    Types: Cigarettes    Start date: 07/12/1971    Quit date: 07/11/2016    Years since quitting: 7.2   Smokeless tobacco: Never  Vaping Use   Vaping status: Never Used  Substance and Sexual Activity   Alcohol  use: Yes    Alcohol /week: 1.0 standard drink of alcohol     Types: 1 Cans of beer per week    Comment: seldom   Drug use: Never   Sexual activity: Not Currently  Other Topics Concern   Not on file  Social History Narrative   Not on file   Social Drivers of Health   Financial Resource Strain: Low Risk  (09/26/2021)   Overall Financial Resource Strain (CARDIA)    Difficulty of Paying Living Expenses: Not hard at all  Food Insecurity: No Food Insecurity (07/22/2021)   Hunger Vital Sign    Worried About Running Out of Food in the Last Year: Never true    Ran Out of Food in the Last Year: Never true  Transportation Needs: No Transportation Needs (09/26/2021)   PRAPARE - Administrator, Civil Service (Medical): No    Lack of Transportation (Non-Medical): No  Physical Activity: Not on file  Stress: No Stress Concern Present (09/26/2021)   [employer] of Occupational Health - Occupational Stress Questionnaire    Feeling of Stress : Not at all  Social Connections: Socially Integrated (09/26/2021)   Social Connection and Isolation Panel    Frequency of Communication with Friends and Family: More than three times a week    Frequency of  Social Gatherings with Friends and Family: Twice a week    Attends Religious Services: 1 to 4 times per year    Active Member of [employer] or Organizations: Yes    Attends Banker Meetings: 1 to 4 times per year    Marital Status: Married   Water engineer and Provision:  Detailed education and instructions provided on heart failure disease management including the following:  Signs and symptoms of Heart Failure When to call the physician Importance of daily weights Low sodium diet Fluid restriction Medication management Anticipated future follow-up appointments  Patient education given on each of the above topics.  Patient acknowledges understanding via teach back method and acceptance of all instructions.  Education Materials:  Living Better With Heart Failure Booklet, HF zone tool, & Daily Weight Tracker Tool.  Patient has scale at home: Yes Patient has pill box at home: NA    High Risk Criteria for Readmission and/or Poor Patient Outcomes: Heart failure hospital admissions (last 6 months): 1  No Show rate: 1 Difficult social situation: No, lives alone Demonstrates medication adherence: Yes Primary Language: English  Literacy level: Reading, writing, and comprehension.   Barriers of Care:   Diet/ fluid restrictions Daily weights  Considerations/Referrals:   Referral made to Heart Failure Pharmacist Stewardship: Yes Referral made to Heart Failure CSW/NCM TOC: No Referral made to Heart & Vascular TOC clinic: Yes, 10/04/2023 @ 2:45 pm.   Items for Follow-up on DC/TOC: Continued HF education Diet/ fluid restrictions/ daily weights   Stephane Haddock, BSN, RN Heart Failure Teacher, adult education Only

## 2023-09-27 NOTE — Plan of Care (Signed)

## 2023-09-27 NOTE — ED Notes (Signed)
 PT GIVEN SANDWICH BAG AND 8OZ OJ

## 2023-09-27 NOTE — Consult Note (Addendum)
 ELECTROPHYSIOLOGY CONSULT NOTE    Patient ID: Jason Russell MRN: [id number], DOB/AGE: 1950-07-03 73 y.o.  Admit date: 09/26/2023 Date of Consult: 09/27/2023  Primary Physician: [name], MD Primary Cardiologist: Vina Gull, MD  Electrophysiologist: Dr. [name]   Referring Provider: Dr. Noralee  Patient Profile: Jason Russell is a 73 y.o. male with a history of  VT s/p ablation 09/2021. NICM who is being seen today for the evaluation of VT at the request of Dr. Noralee.  HPI:  Jason Russell is a 73 y.o. male with a past medical history of VT status post ablation in June 2023, nonischemic cardiomyopathy (LVEF 35 to 40%) /chronic systolic dysfunction with an Abbott single-chamber ICD who is followed by Dr. [name].    He presented to the hospital on 6/26 with reports of shortness of breath over 6 days.  Patient noted that he had dyspnea even with minimal effort.  He was called by his cardiology device clinic and told that he had been detected to have an irregular heart rhythm through his ICD and advised him to come to the hospital.  An alert was received by [employer] on 6/21 for EGM consistent with irregular RR and periods of irregularity concerning for atrial fibrillation with RVR.  The patient received ATP x 1 which slowed the rate below the VT 2 zone.  He had 33 nonsustained VT events that were also recorded with irregular RR.  He is on Xarelto  at baseline.  Heart failure diagnostics by device remote were abnormal at that time.  Patient had mild hypokalemia on admit, baseline CKD with a creatinine of 2.01. CXR showed mild vascular congestion.  He denies chest pain, palpitations, dyspnea, PND, orthopnea, nausea, vomiting, dizziness, syncope, edema, weight gain, or early satiety.   Labs Potassium3.2* (06/26 9490) Magnesium   2.0 (06/26 0509) Creatinine, ser  2.08* (06/26 0509) PLT  201 (06/26 0509) HGB  12.0* (06/26 0509) WBC 10.0 (06/26 0509) Troponin I (High  Sensitivity)31* (06/25 1831).    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 St. Jude ICD implantation 10/03/12.   Chronic anticoagulation    Chronic fatigue 10/18/2016   Chronic systolic heart failure (HCC)    a. Echo 7/13: EF 25%;  b. echo 04/2012:  Mild LVH, EF 30-35%, Gr 1 DD, mild AI, mild MR, mild LAE   COPD (chronic obstructive pulmonary disease) (HCC)    Dyslipidemia    Gout    History of blood transfusion 10/15/2013   don't know where the blood's going; HgB down to 5   Hyperlipidemia    Hypertension    Hypothyroidism    NICM (nonischemic cardiomyopathy) (HCC)    LHC 4/14:  minimal CAD   Obesity    OSA on CPAP    Thoracic ascending aortic aneurysm (HCC) 07/25/2023   CT 07/25/23: TAA 4.1 cm; 7 mm LUL nodule    Tobacco abuse      Surgical History:  Past Surgical History:  Procedure Laterality Date   CARDIAC DEFIBRILLATOR PLACEMENT  2014   CARDIOVERSION  2011   CARDIOVERSION N/A 03/15/2020   Procedure: CARDIOVERSION;  Surgeon: Gull Vina GAILS, MD;  Location: [facility] ENDOSCOPY;  Service: Cardiovascular;  Laterality: N/A;   COLONOSCOPY WITH PROPOFOL  Left 10/17/2013   Procedure: COLONOSCOPY WITH PROPOFOL ;  Surgeon: Lamar JONETTA Aho, MD;  Location: [facility] ENDOSCOPY;  Service: Endoscopy;  Laterality: Left;   ESOPHAGOGASTRODUODENOSCOPY N/A 10/17/2013   Procedure: ESOPHAGOGASTRODUODENOSCOPY (EGD);  Surgeon: Lamar JONETTA Aho, MD;  Location: [facility] ENDOSCOPY;  Service: Endoscopy;  Laterality: N/A;   ESOPHAGOGASTRODUODENOSCOPY (EGD) WITH PROPOFOL  N/A 06/14/2018   Procedure: ESOPHAGOGASTRODUODENOSCOPY (EGD) WITH PROPOFOL ;  Surgeon: Legrand Victory LITTIE DOUGLAS, MD;  Location: [facility] ENDOSCOPY;  Service: Gastroenterology;  Laterality: N/A;   GIVENS  CAPSULE STUDY N/A 10/29/2013   Procedure: GIVENS CAPSULE STUDY;  Surgeon: Lamar JONETTA Aho, MD;  Location: [facility] ENDOSCOPY;  Service: Endoscopy;  Laterality: N/A;   ICD GENERATOR CHANGEOUT N/A 02/13/2018   Procedure: ICD GENERATOR CHANGEOUT;  Surgeon: Inocencio Soyla Lunger, MD;  Location: [facility] INVASIVE CV LAB;  Service: Cardiovascular;  Laterality: N/A;   IMPLANTABLE CARDIOVERTER DEFIBRILLATOR IMPLANT Left 10/03/2012   Procedure: IMPLANTABLE CARDIOVERTER DEFIBRILLATOR IMPLANT;  Surgeon: Elspeth JAYSON Sage, MD;  Location: [facility] C[facility] LAB;  Service: Cardiovascular;  Laterality: Left;   IR FLUORO GUIDE PORT INSERTION RIGHT  09/21/2016   IR US  GUIDE VASC ACCESS RIGHT  09/21/2016   JOINT REPLACEMENT     REVERSE SHOULDER ARTHROPLASTY Right 09/22/2020   Procedure: SHOULDER HEMI ARTHROPLASTY;  Surgeon: Cristy Bonner DASEN, MD;  Location: [facility] ORS;  Service: Orthopedics;  Laterality: Right;   SAVORY DILATION N/A 06/14/2018   Procedure: SAVORY DILATION;  Surgeon: Legrand Victory LITTIE DOUGLAS, MD;  Location: [facility] ENDOSCOPY;  Service: Gastroenterology;  Laterality: N/A;   TONSILLECTOMY  1950's   TOTAL HIP ARTHROPLASTY Right 11/25/1997   V TACH ABLATION N/A 09/15/2021   Procedure: V TACH ABLATION;  Surgeon: [name] Danelle ORN, MD;  Location: [facility] INVASIVE CV LAB;  Service: Cardiovascular;  Laterality: N/A;   VIDEO BRONCHOSCOPY WITH ENDOBRONCHIAL ULTRASOUND N/A 08/09/2016   Procedure: VIDEO BRONCHOSCOPY WITH ENDOBRONCHIAL ULTRASOUND;  Surgeon: Noreen Tonnie BRAVO, MD;  Location: [facility] OR;  Service: Thoracic;  Laterality: N/A;     Medications Prior to Admission  Medication Sig Dispense Refill Last Dose/Taking   albuterol  (VENTOLIN  HFA) 108 (90 Base) MCG/ACT inhaler Inhale 2 puffs into the lungs every 4 (four) hours as needed for wheezing or shortness of breath. 8.5 g 5 09/26/2023 Morning   amiodarone  (PACERONE ) 200 MG tablet Take 1 tablet (200 mg total) by mouth daily. 90 tablet 0 09/26/2023 Morning   atorvastatin  (LIPITOR) 40 MG tablet TAKE 1 TABLET BY MOUTH  EVERY DAY 90 tablet 3 09/26/2023 Morning   Bempedoic Acid-Ezetimibe (NEXLIZET ) 180-10 MG TABS Take 1 tablet by mouth daily in the afternoon. 30 tablet 11 09/25/2023 Noon   bisoprolol  (ZEBETA ) 5 MG tablet TAKE 1 TABLET (5 MG TOTAL) BY MOUTH DAILY. 90 tablet 3 09/26/2023 Morning   [employer] D3 2000 units CAPS Take 2,000 Units by mouth daily.   11 09/26/2023 Morning   ferrous gluconate  (FERGON) 324 MG tablet TAKE 1 TABLET BY MOUTH THREE TIMES A DAY WITH MEALS 270 tablet 3 09/26/2023 Morning   furosemide  (LASIX ) 40 MG tablet Take one tablet (40 mg) by mouth daily with an extra tablet (80 mg total) twice a week only.   09/26/2023 Morning   JARDIANCE 10 MG TABS tablet Take 10 mg by mouth daily.   09/26/2023 Morning   levothyroxine  (SYNTHROID ) 50 MCG tablet TAKE 1 TABLET BY MOUTH EVERY DAY BEFORE BREAKFAST 90 tablet 2 09/26/2023 Morning   magnesium  oxide (MAG-OX) 400 (241.3 Mg) MG tablet Take 1 tablet (400 mg total) by  mouth daily. 30 tablet 0 09/26/2023 Morning   Polyvinyl Alcohol -Povidone (REFRESH OP) Place 1 drop into both eyes every morning.   09/26/2023 Morning   Potassium Chloride  ER 20 MEQ TBCR TAKE 1 TABLET BY MOUTH EVERY DAY 90 tablet 2 09/26/2023 Morning   sacubitril -valsartan  (ENTRESTO ) 97-103 MG Take 1 tablet by mouth 2 (two) times daily. 180 tablet 3 09/26/2023 Morning   sildenafil  (VIAGRA ) 100 MG tablet TAKE 1 TABLET BY MOUTH EVERY DAY AS NEEDED FOR ERECTILE DYSFUNCTION 30 tablet 1 Past Week   spironolactone  (ALDACTONE ) 25 MG tablet TAKE 1 TABLET (25 MG TOTAL) BY MOUTH DAILY. 90 tablet 2 09/26/2023 Morning   TRELEGY ELLIPTA  100-62.5-25 MCG/ACT AEPB INHALE 1 PUFF BY MOUTH EVERY DAY 180 each 3 09/26/2023 Morning   ULORIC  40 MG tablet Take 1 tablet (40 mg total) by mouth daily. 30 tablet 0 09/26/2023 Morning   XARELTO  20 MG TABS tablet TAKE 1 TABLET BY MOUTH EVERY DAY 90 tablet 1 09/26/2023 at 10:00 AM   dapagliflozin  propanediol (FARXIGA ) 10 MG TABS tablet Take 1 tablet (10 mg total) by mouth daily before breakfast.  (Patient not taking: Reported on 09/27/2023) 90 tablet 3 Not Taking    Inpatient Medications:   amiodarone   200 mg Oral Daily   atorvastatin   40 mg Oral Daily   bisoprolol   5 mg Oral Daily   budesonide-glycopyrrolate-formoterol  2 puff Inhalation BID   [START ON 09/28/2023] empagliflozin  10 mg Oral Daily   febuxostat   40 mg Oral Daily   ferrous gluconate   324 mg Oral Q breakfast   furosemide   60 mg Intravenous BID   levothyroxine   50 mcg Oral Q0600   potassium chloride   40 mEq Oral Once   rivaroxaban   20 mg Oral Daily   sacubitril -valsartan   1 tablet Oral BID   spironolactone   25 mg Oral Daily    Allergies: No Known Allergies  Family History  Problem Relation Age of Onset   Hypertension Mother    Heart Problems Mother    Other Father        deceased- unknow reason   Lung cancer Brother      Physical Exam: Vitals:   09/27/23 1257 09/27/23 1323 09/27/23 1344 09/27/23 1615  BP: (!) 142/76 (!) 141/77  136/65  Pulse: 97 85  73  Resp: 20 (!) 30  (!) 29  Temp:  98 F (36.7 C)    TempSrc:  Oral    SpO2: 100% 96%  92%  Weight:   135.8 kg   Height:   6' 1 (1.854 m)     GEN- NAD, A&O x 3, normal affect HEENT: Normocephalic, atraumatic Lungs- CTAB, Normal effort.  Heart- Irregularly irregular rate and rhythm, No M/G/R.  GI- Soft, NT, ND.  Extremities- No clubbing, cyanosis, or edema   Radiology/Studies: DG Chest 2 View Result Date: 09/26/2023 CLINICAL DATA:  sob EXAM: CHEST - 2 VIEW COMPARISON:  July 06, 2021 FINDINGS: Right chest port in place terminating in the lower SVC. Left chest pacemaker/AICD with a single lead terminating in the right ventricle. Lower lung volumes. No focal airspace consolidation, pleural effusion, or pneumothorax. No cardiomegaly. Tortuous aorta with aortic atherosclerosis. No acute fracture or destructive lesions. Multilevel thoracic osteophytosis. IMPRESSION: 1. Lower lung volumes with streaky atelectasis in both lung bases. No pneumonia or  pulmonary edema. 2. Left chest pacemaker/AICD with a single lead terminating in the right ventricle. Of note, there is angulation and kinking of the mid lead just below the clavicle. No lead  fracture or discontinuity visualized. Electronically Signed   By: Rogelia Myers M.D.   On: 09/26/2023 15:29    EKG:6/26 SB 55 bpm, RBBB / LAFB  (personally reviewed)  TELEMETRY: AF 80's with PVC's (personally reviewed)  Device History: Abbott Single Chamber ICD implanted 2014, gen change 2019 for CHF  Assessment/Plan:  Irregular R-R on EGM / Suspected Atrial Fibrillation  Hx Paroxysmal Atrial Fibrillation  Classified as VT-2 zone, s/p ATP by device (thought to be inappropriate ATP for AF), binned in VT zone but irregular with  -continue amiodarone  200 mg PO daily  -volume removal per TRH as below  -continue bisoprolol   -assess EKG in am to review for AF   Acute on Chronic Systolic CHF  -LVEF 35-40% 07/2023  -lasix  60 mg IV BID  -GDMT per TRH  -follow BMP   HTN -per TRH   CKD 3b -Trend BMP / urinary output -Replace electrolytes as indicated -Avoid nephrotoxic agents, ensure adequate renal perfusion  IDA -per TRH    For questions or updates, please contact [facility] Please consult [email or web address] for contact info under     Signed, Daphne Barrack, NP-C, AGACNP-BC [facility] - Electrophysiology  09/27/2023, 6:21 PM  I have seen and examined this patient with Daphne Barrack.  Agree with above, note added to reflect my findings.  Patient with a past history as above.  He has a history of VT ablation in 2023.  He is on amiodarone .  He has a nonischemic cardiomyopathy.  He presented to the hospital 09/27/2023 with shortness of breath for 6 days.  He was called by device clinic as they detected an irregular rhythm.  He received ATP therapy for his irregular rhythm which slowed the rhythm.  Review of device interrogation shows possible atrial fibrillation.  When in the  hospital, he received IV Lasix  with improvement in his respiratory status.  He has no acute complaints currently.  He is lying flat without issue.  He is not on oxygen .  GEN: No acute distress.   Neck: No JVD Cardiac: RRR, no murmurs, rubs, or gallops.  Respiratory: normal BS bases bilaterally. GI: Soft, nontender, non-distended  MS: No edema; No deformity. Neuro:  Nonfocal  Skin: warm and dry, device site well healed Psych: Normal affect    Acute on chronic systolic heart failure: Patient is obese and thus volume status is difficult.  He does feel better on Lasix  twice daily.  Jakeya Gherardi continue today.  May be able to switch to p.o. Lasix  tomorrow as he is not on oxygen  and is lying flat. Atrial fibrillation: Has a history of atrial fibrillation in the past.  Is on amiodarone .  Based on device interrogation, he received ATP with slowing of his arrhythmia.  He has a very irregular RR interval.  His rates are better controlled currently.  It appears that he is in sinus with PACs. Hypertension: Per hospitalist CKD stage IIIb: Azani Brogdon monitor with diuresis.  Vong Garringer M. Evangeline Utley MD 09/27/2023 6:53 PM

## 2023-09-27 NOTE — ED Notes (Signed)
 Called CCMD to verify monitoring

## 2023-09-27 NOTE — Hospital Course (Addendum)
 [name] was admitted to the hospital with the working diagnosis of heart failure exacerbation.   73 y.o. male with medical history significant of atrial fibrillation, heart failure, sp AICD, COPD, dyslipidemia, gout, OSA and obesity who presented with dyspnea. Reported 6 days of worsening dyspnea. Progressive worsening symptoms to the point where he was having dyspnea with minimal efforts.  Positive PND. He was called by his cardiologist due to detected irregular heart rhythm through his AICD, advising him to come to the hospital.  Noted report from June 24 ICD, with multiple episodes of non sustained ventricular tachycardia.  On his initial physical examination his blood pressure was 143/61, HR 62, RR 28 and 02 saturation 100% on supplemental 02 per [place]. Cardiovascular with S1 and S2 present, irregular with no gallops, rubs or murmurs Positive moderate JVD Respiratory with rales at bases with no wheezing or rhonchi  Abdomen protuberant but non tender and not distended Positive lower extremity edema ++  Na 142, K 4,1 Cl 112 bicarbonate 18 glucose 100 bun 19 cr 2,0  BNP 468  High sensitive troponin 40  Wbc 8,3 hgb 11.5 plt 194  INR 2,7    Chest radiograph with cardiomegaly, bilateral hilar vascular congestion, no effusions, defibrillator in place with one single right ventricular lead, kinked at the level of the clavicle.    EKG 55 bpm, left axis deviation, right bundle branch block, qtc 473, sinus rhythm with PAC, no significant ST segment or  T wave changes  Patient was placed on IV furosemide  and electrolytes were corrected.  EP was consulted, device interrogation, patient having atrial fibrillation, and receiving ATP to slow rhythm.   06/27 volume status has improved, patient with no dyspnea, PND or orthopnea. Edema resolved.  Patient will continue medical therapy at home and plan to follow up as outpatient.

## 2023-09-28 DIAGNOSIS — I5023 Acute on chronic systolic (congestive) heart failure: Secondary | ICD-10-CM | POA: Diagnosis not present

## 2023-09-28 DIAGNOSIS — E66813 Obesity, class 3: Secondary | ICD-10-CM | POA: Diagnosis not present

## 2023-09-28 DIAGNOSIS — N1832 Chronic kidney disease, stage 3b: Secondary | ICD-10-CM | POA: Diagnosis not present

## 2023-09-28 DIAGNOSIS — I480 Paroxysmal atrial fibrillation: Secondary | ICD-10-CM | POA: Diagnosis not present

## 2023-09-28 DIAGNOSIS — E039 Hypothyroidism, unspecified: Secondary | ICD-10-CM | POA: Diagnosis not present

## 2023-09-28 DIAGNOSIS — C3411 Malignant neoplasm of upper lobe, right bronchus or lung: Secondary | ICD-10-CM | POA: Diagnosis not present

## 2023-09-28 DIAGNOSIS — D509 Iron deficiency anemia, unspecified: Secondary | ICD-10-CM | POA: Diagnosis not present

## 2023-09-28 DIAGNOSIS — J430 Unilateral pulmonary emphysema [MacLeod's syndrome]: Secondary | ICD-10-CM | POA: Diagnosis not present

## 2023-09-28 DIAGNOSIS — I10 Essential (primary) hypertension: Secondary | ICD-10-CM | POA: Diagnosis not present

## 2023-09-28 LAB — BASIC METABOLIC PANEL WITH GFR
Anion gap: 13 (ref 5–15)
BUN: 29 mg/dL — ABNORMAL HIGH (ref 8–23)
CO2: 25 mmol/L (ref 22–32)
Calcium: 9.4 mg/dL (ref 8.9–10.3)
Chloride: 104 mmol/L (ref 98–111)
Creatinine, Ser: 2.47 mg/dL — ABNORMAL HIGH (ref 0.61–1.24)
GFR, Estimated: 27 mL/min — ABNORMAL LOW (ref >60)
Glucose, Bld: 108 mg/dL — ABNORMAL HIGH (ref 70–99)
Potassium: 3.7 mmol/L (ref 3.5–5.1)
Sodium: 142 mmol/L (ref 135–145)

## 2023-09-28 LAB — MAGNESIUM: Magnesium: 1.8 mg/dL (ref 1.7–2.4)

## 2023-09-28 MED ORDER — FUROSEMIDE 40 MG PO TABS: 60.0000 mg | ORAL_TABLET | Freq: Every day | ORAL | 0 refills | Status: DC

## 2023-09-28 MED ORDER — POTASSIUM CHLORIDE CRYS ER 20 MEQ PO TBCR
20.0000 meq | EXTENDED_RELEASE_TABLET | Freq: Once | ORAL | Status: AC
  Administered 2023-09-28: 20 meq via ORAL
  Filled 2023-09-28: qty 1

## 2023-09-28 MED ORDER — MAGNESIUM SULFATE 2 GM/50ML IV SOLN
2.0000 g | Freq: Once | INTRAVENOUS | Status: AC
  Administered 2023-09-28: 2 g via INTRAVENOUS
  Filled 2023-09-28: qty 50

## 2023-09-28 NOTE — Progress Notes (Signed)
 DISCHARGE NOTE HOME Jason Russell to be discharged Home per MD order. Discussed prescriptions and follow up appointments with the patient. Prescriptions given to patient; medication list explained in detail. Patient verbalized understanding.  Skin clean, dry and intact without evidence of skin break down, no evidence of skin tears noted. IV catheter discontinued intact. Site without signs and symptoms of complications. Dressing and pressure applied. Pt denies pain at the site currently. No complaints noted.  Patient free of lines, drains, and wounds.   An After Visit Summary (AVS) was printed and given to the patient. Patient escorted via wheelchair, and discharged home via private auto.  [name], RN

## 2023-09-28 NOTE — TOC Initial Note (Signed)
 Transition of Care [facility]) - Initial/Assessment Note    Patient Details  Name: Jason Russell MRN: [id number] Date of Birth: 16-Feb-1951  Transition of Care [facility]) CM/SW Contact:    [name] Barnie Rama, RN Phone Number: 09/28/2023, 12:02 PM  Clinical Narrative:                 From home alone, indep, has PCP and insurance on file, states has no HH services in place at this time or DME at home.  States family member will transport them home at [employer] and family is support system, [facility] [address].  Pta self ambulatory .  He eats a no sodium diet, he has a scale at home for daily weights.  Expected Discharge Plan: Home/Self Care Barriers to Discharge: No Barriers Identified   Patient Goals and CMS Choice Patient states their goals for this hospitalization and ongoing recovery are:: get better   Choice offered to / list presented to : NA      Expected Discharge Plan and Services In-house Referral: NA Discharge Planning Services: CM Consult Post Acute Care Choice: NA Living arrangements for the past 2 months: Single Family Home Expected Discharge Date: 09/28/23               DME Arranged: N/A DME Agency: NA       HH Arranged: NA          Prior Living Arrangements/Services Living arrangements for the past 2 months: Single Family Home Lives with:: Self Patient language and need for interpreter reviewed:: Yes Do you feel safe going back to the place where you live?: Yes      Need for Family Participation in Patient Care: Yes (Comment) Care giver support system in place?: Yes (comment)   Criminal Activity/Legal Involvement Pertinent to Current Situation/Hospitalization: No - Comment as needed  Activities of Daily Living   ADL Screening (condition at time of admission) Independently performs ADLs?: No Does the patient have a NEW difficulty with bathing/dressing/toileting/self-feeding that is expected to last >3 days?: No Does the patient  have a NEW difficulty with getting in/out of bed, walking, or climbing stairs that is expected to last >3 days?: No Does the patient have a NEW difficulty with communication that is expected to last >3 days?: No Is the patient deaf or have difficulty hearing?: No Does the patient have difficulty seeing, even when wearing glasses/contacts?: No Does the patient have difficulty concentrating, remembering, or making decisions?: No  Permission Sought/Granted Permission sought to share information with : Case Manager Permission granted to share information with : Yes, Verbal Permission Granted              Emotional Assessment Appearance:: Appears stated age Attitude/Demeanor/Rapport: Engaged Affect (typically observed): Appropriate Orientation: : Oriented to Self, Oriented to Place, Oriented to  Time, Oriented to Situation Alcohol  / Substance Use: Not Applicable Psych Involvement: No (comment)  Admission diagnosis:  Acute heart failure (HCC) [I50.9] Atrial fibrillation, unspecified type (HCC) [I48.91] Acute on chronic congestive heart failure, unspecified heart failure type (HCC) [I50.9] Patient Active Problem List   Diagnosis Date Noted   Acute on chronic systolic CHF (congestive heart failure) (HCC) 09/26/2023   Obesity, class 3 09/26/2023   CKD stage 3b, GFR 30-44 ml/min (HCC) 09/26/2023   Thoracic ascending aortic aneurysm (HCC) 07/25/2023   VT (ventricular tachycardia) (HCC) 08/19/2021   ICD (implantable cardioverter-defibrillator) in place 08/19/2021   Prerenal azotemia 06/13/2021   Benign hypertension with chronic kidney disease,  stage III (HCC) 12/14/2020   Status post right shoulder hemiarthroplasty 09/22/2020   AKI (acute kidney injury) (HCC) 08/11/2020   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   ICD (implantable cardioverter-defibrillator) malfunction 02/13/2018   Lung nodule < 6cm on CT 01/11/2017   Mediastinal adenopathy 01/11/2017   CHF (congestive heart failure) (HCC)  01/11/2017   Hypothyroidism 01/11/2017   Weight loss, non-intentional 01/08/2017   Defibrillator discharge 01/08/2017   Chronic fatigue 10/18/2016   Port catheter in place 09/25/2016   Encounter for antineoplastic chemotherapy 08/24/2016   Goals of care, counseling/discussion 08/24/2016   Primary cancer of right upper lobe of lung (HCC) 08/23/2016   Lung nodule 07/21/2016   Right rotator cuff tear 06/05/2016   Clotting disorder (HCC) 10/07/2015   COPD (chronic obstructive pulmonary disease) (HCC) 10/07/2015   Metabolic bone disease 08/02/2015   Vitamin D  deficiency 08/02/2015   Anemia 10/29/2013   Internal hemorrhoids 10/17/2013   Iron  deficiency anemia 10/16/2013   Symptomatic anemia 10/15/2013   Coronary atherosclerosis 10/04/2012   Abnormal glucose 09/07/2012   Allergy 09/07/2012   Gout 09/07/2012   Psychosexual dysfunction with inhibited sexual excitement 09/07/2012   Primary cardiomyopathy (HCC) 08/16/2012   Shortness of breath dyspnea 11/01/2011   Chronic anticoagulation 11/01/2011   Chronic systolic heart failure (HCC) 11/01/2011   Exertional dyspnea 11/01/2011   Encounter for current long-term use of anticoagulants 11/01/2011   Obstructive sleep apnea (adult) (pediatric) 12/20/2009   Atrial fibrillation (HCC) 09/24/2009   Hypokalemia 07/26/2009   HYPOPOTASSEMIA 07/26/2009   Hypercholesterolemia 07/15/2009   Overweight 07/15/2009   TOBACCO ABUSE 07/15/2009   Hypertension, benign 07/15/2009   Overweight 07/15/2009   PCP:  [name], MD Pharmacy:   [id number] [address], [place] - [address] [place] [address] Phone: [phone] Fax: [phone]     Social Drivers of Health (SDOH) Social History: SDOH Screenings   Food Insecurity: No Food Insecurity (09/27/2023)  Housing: Low Risk  (09/27/2023)  Transportation Needs: No Transportation Needs (09/27/2023)  Utilities: Not At Risk (09/27/2023)  Alcohol  Screen: Low  Risk  (09/27/2023)  Depression (PHQ2-9): Low Risk  (09/26/2021)  Financial Resource Strain: Low Risk  (09/27/2023)  Social Connections: Moderately Integrated (09/27/2023)  Stress: No Stress Concern Present (09/26/2021)  Tobacco Use: Medium Risk (09/27/2023)   SDOH Interventions: Housing Interventions: Intervention Not Indicated Transportation Interventions: Intervention Not Indicated Alcohol  Usage Interventions: Intervention Not Indicated (Score <7) Financial Strain Interventions: Intervention Not Indicated   Readmission Risk Interventions    09/28/2023   11:59 AM  Readmission Risk Prevention Plan  Transportation Screening Complete  PCP or Specialist Appt within 3-5 Days Complete  HRI or Home Care Consult Complete  Palliative Care Screening Not Applicable  Medication Review (RN Care Manager) Complete

## 2023-09-28 NOTE — TOC Transition Note (Signed)
 Transition of Care [facility]) - Discharge Note   Patient Details  Name: Jason Russell MRN: [id number] Date of Birth: Mar 03, 1951  [facility]) CM/SW Contact:  [name] Barnie Rama, RN Phone Number: 09/28/2023, 12:02 PM   Clinical Narrative:    For dc today, girlfriend at bedside to transport home.   Final next level of care: Home/Self Care Barriers to Discharge: No Barriers Identified   Patient Goals and CMS Choice Patient states their goals for this hospitalization and ongoing recovery are:: get better   Choice offered to / list presented to : NA      Discharge Placement                       Discharge Plan and Services Additional resources added to the After Visit Summary for   In-house Referral: NA Discharge Planning Services: CM Consult Post Acute Care Choice: NA          DME Arranged: N/A DME Agency: NA       HH Arranged: NA          Social Drivers of Health (SDOH) Interventions SDOH Screenings   Food Insecurity: No Food Insecurity (09/27/2023)  Housing: Low Risk  (09/27/2023)  Transportation Needs: No Transportation Needs (09/27/2023)  Utilities: Not At Risk (09/27/2023)  Alcohol  Screen: Low Risk  (09/27/2023)  Depression (PHQ2-9): Low Risk  (09/26/2021)  Financial Resource Strain: Low Risk  (09/27/2023)  Social Connections: Moderately Integrated (09/27/2023)  Stress: No Stress Concern Present (09/26/2021)  Tobacco Use: Medium Risk (09/27/2023)     Readmission Risk Interventions    09/28/2023   11:59 AM  Readmission Risk Prevention Plan  Transportation Screening Complete  PCP or Specialist Appt within 3-5 Days Complete  HRI or Home Care Consult Complete  Palliative Care Screening Not Applicable  Medication Review (RN Care Manager) Complete

## 2023-09-28 NOTE — Progress Notes (Signed)
   Heart Failure Stewardship Pharmacist Progress Note   PCP: [name], MD PCP-Cardiologist: Vina Gull, MD    HPI:  77 YOM with PMH of Afib, HF, s/p AICD, COPD, dyslipidemia, gout, OSA, and obesity.  Presented to ED with worsening dyspnea that occurred during minimal exertion. Was notified by cardiologist that AICD detected irregular heart rhythm and advised him to come to hospital.  Report from June 24 ICD, multiple episodes of non sustained ventricular tachycardia.  ECHO from 07/2023 showed LVEF 35-40% with LV moderately decreased function, global hypokinesis, moderate-severe dilation, mild eccentric LV hypertrophy with grade I diastolic dysfunction. LA moderately dilated. RA mildly dilated.  Compared to previous ECHO 05/2018 LVEF is unchanged.  On exam, patient endorses some SOB when moving around but states it is much better than when he came in. Denies chest pain / discomfort. Does not appear to have lower extremity swelling.   Current HF Medications: Diuretic: furosemide  60 mg IV BID Beta Blocker: bisoprolol  5 mg daily ACE/ARB/ARNI: Entresto  97/103 mg BID MRA: spironolactone  25 mg daily SGLT2i: Jardiance  10 mg daily  Prior to admission HF Medications: Diuretic: furosemide  40 mg PO daily - takes an extra tablet twice a week Beta blocker: bisoprolol  5 mg daily ACE/ARB/ARNI: Entresto  97/103 mg BID MRA: spironolactone  25 mg daily SGLT2i: Jardiance  10 mg daily   Pertinent Lab Values: Serum creatinine 2.47, BUN 29, Potassium 3.7, Sodium 142, BNP 468.9, Magnesium  1.8  Vital Signs: Weight: 296 lbs (admission weight: 313 lbs) Blood pressure: 130-140/ 60-80's Heart rate: 91 (77-101) I/O: net -2.1L yesterday; net -3.1L since admission  Medication Assistance / Insurance Benefits Check: Does the patient have prescription insurance?  Yes Type of insurance plan: Medicare A/B + [employer]ET[employer]  Outpatient Pharmacy:  [facility] [address] Is the  patient willing to use [facility] TOC pharmacy at discharge? No - not receiving new medications at discharge Is the patient willing to transition their outpatient pharmacy to utilize a [facility] outpatient pharmacy?   No   Assessment: 1. Acute on chronic systolic CHF (LVEF 35-40%), due to NICM. NYHA class II/III symptoms. - Continue furosemide  60 mg IV BID - Continue bisoprolol  5 mg daily - Continue Entresto  97/103 mg BID - Continue spironolactone  25 mg daily - Continue Jardiance  10 mg daily - Daily weight. Strict I/O's. - Keep K > 4, Mg > 2  Plan: 1) Medication changes recommended at this time: - Recommend potassium 40 mEq PO x 1 - received one dose yesterday when K 3.2   2) Patient assistance: - Patient said he had no difficulty obtaining medications.   3)  Education  - Patient has been educated on current HF medications and potential additions to HF medication regimen - Patient verbalizes understanding that over the next few months, these medication doses may change and more medications may be added to optimize HF regimen - Patient has been educated on basic disease state pathophysiology and goals of therapy  Bernardino George, PharmD Candidate [address] [facility] [employer] 09/28/2023 1:40 PM

## 2023-09-28 NOTE — Plan of Care (Signed)

## 2023-09-28 NOTE — Progress Notes (Cosign Needed)
  Patient Name: Jason Russell Date of Encounter: 09/28/2023  Primary Cardiologist: Vina Gull, MD Electrophysiologist: Danelle Birmingham, MD  Interval Summary   The patient is doing well today.  At this time, the patient denies chest pain, shortness of breath, or any new concerns.  Vital Signs    Vitals:   09/27/23 2022 09/28/23 0013 09/28/23 0502 09/28/23 0727  BP: 138/64 (!) 142/64 (!) 145/87 138/62  Pulse: 65 78 72 81  Resp: (!) 26 (!) 28 (!) 27 (!) 23  Temp: 97.7 F (36.5 C) 98 F (36.7 C) 98.2 F (36.8 C) 98.3 F (36.8 C)  TempSrc: Oral Oral Oral Oral  SpO2: 95% 95% 97% 96%  Weight:   134.4 kg   Height:        Intake/Output Summary (Last 24 hours) at 09/28/2023 0754 Last data filed at 09/28/2023 0507 Gross per 24 hour  Intake 477 ml  Output 2075 ml  Net -1598 ml   Filed Weights   09/26/23 1423 09/27/23 1344 09/28/23 0502  Weight: (!) 142 kg 135.8 kg 134.4 kg    Physical Exam    GEN- The patient is well appearing, alert and oriented x 3 today.   Lungs- Clear to ausculation bilaterally, normal work of breathing Cardiac- irregular rate and rhythm, no murmurs, rubs or gallops GI- soft, NT, ND, + BS Extremities- no clubbing or cyanosis. No edema  Telemetry    AF with VP 60-90's (personally reviewed)  Device History: [employer] Single Chamber ICD implanted 2014, gen change 2019 for CHF  Hospital Course    Jason Russell is a 73 y.o. male with a history of  VT s/p ablation 09/2021, NICM s/p single chamber ICD.  Admitted with 6 day hx of shortness of breath.  Recent alerts for ATP for HVR > on review appears to be AF (inappropriate therapy).   Assessment & Plan    Irregular R-R on EGM / Suspected Atrial Fibrillation  Hx Paroxysmal Atrial Fibrillation  Classified as VT-2 zone, s/p ATP by device (thought to be inappropriate ATP for AF), binned in VT zone but irregular with concern for AF.  -continue amiodarone  200 mg daily  -bisoprolol  5mg  daily  -OAC for stroke  prophylaxis  -volume removal per TRH  -plan for outpatient EP follow up with Dr. Birmingham in 1-1.5 month   Secondary Hypercoagulable State  -continue Xarelto  20 mg daily    Acute on Chronic Systolic CHF  -LVEF 35-40% 07/2023 -GDMT per primary  -defer diuresis to TR[facility]  -will arrange for Cardiology follow up as well   HTN  -per TRH    Ok for discharge from EP perspective. Follow up arranged for EP and Cardiology.     For questions or updates, please contact [facility] Please consult [email or web address] for contact info under     Signed, Daphne Barrack, NP-C, AGACNP-BC  [facility] - Electrophysiology  09/28/2023, 10:17 AM  EP Attending   Patient seen and examined. Agree with the findings as above. Ok for DC home. Usual followup as outlined above.   Danelle Taylor,MD

## 2023-09-28 NOTE — Discharge Summary (Signed)
 Physician Discharge Summary   Patient: Jason Russell MRN: [id number] DOB: [birth year]  Admit date:     09/26/2023  Discharge date: 09/28/23  Discharge Physician: Elidia Sieving Elyza Whitt   PCP: [name], MD   Recommendations at discharge:    Furosemide  increased to 60 mg daily and 80 mg in case of volume overload, weight gain 2 to 3 lbs in 24 hrs or 5 lbs in 7 days.  Continue guideline directed medical therapy with entresto , spironolactone , SGLT 2 inh and B blocker with bisoprolol .  Follow up with renal function and electrolytes in 7 days as outpatient Follow up with Dr Benjamine in 7 to 10 days Follow up with Cardiology and EP as scheduled.   Discharge Diagnoses: Principal Problem:   Acute on chronic systolic CHF (congestive heart failure) (HCC) Active Problems:   Hypertension, benign   Atrial fibrillation (HCC)   CKD stage 3b, GFR 30-44 ml/min (HCC)   Iron  deficiency anemia   Gout   COPD (chronic obstructive pulmonary disease) (HCC)   Hypothyroidism   Primary cancer of right upper lobe of lung (HCC)   Obesity, class 3  Resolved Problems:   * No resolved hospital problems. [facility] Course: Mr. [name] was admitted to the hospital with the working diagnosis of heart failure exacerbation.   73 y.o. male with medical history significant of atrial fibrillation, heart failure, sp AICD, COPD, dyslipidemia, gout, OSA and obesity who presented with dyspnea. Reported 6 days of worsening dyspnea. Progressive worsening symptoms to the point where he was having dyspnea with minimal efforts.  Positive PND. He was called by his cardiologist due to detected irregular heart rhythm through his AICD, advising him to come to the hospital.  Noted report from June 24 ICD, with multiple episodes of non sustained ventricular tachycardia.  On his initial physical examination his blood pressure was 143/61, HR 62, RR 28 and 02 saturation 100% on supplemental 02 per [place]. Cardiovascular with S1 and S2  present, irregular with no gallops, rubs or murmurs Positive moderate JVD Respiratory with rales at bases with no wheezing or rhonchi  Abdomen protuberant but non tender and not distended Positive lower extremity edema ++  Na 142, K 4,1 Cl 112 bicarbonate 18 glucose 100 bun 19 cr 2,0  BNP 468  High sensitive troponin 40  Wbc 8,3 hgb 11.5 plt 194  INR 2,7    Chest radiograph with cardiomegaly, bilateral hilar vascular congestion, no effusions, defibrillator in place with one single right ventricular lead, kinked at the level of the clavicle.    EKG 55 bpm, left axis deviation, right bundle branch block, qtc 473, sinus rhythm with PAC, no significant ST segment or  T wave changes  Patient was placed on IV furosemide  and electrolytes were corrected.  EP was consulted, device interrogation, patient having atrial fibrillation, and receiving ATP to slow rhythm.   06/27 volume status has improved, patient with no dyspnea, PND or orthopnea. Edema resolved.  Patient will continue medical therapy at home and plan to follow up as outpatient.   Assessment and Plan: * Acute on chronic systolic CHF (congestive heart failure) (HCC) 07/2023 echocardiogram with reduced LV systolic function 35 to 40%, global hypokinesis, moderate to severe dilatation of internal cavity, mild LVH, RV systolic function preserved, LA with moderate dilatation, RA with mild dilatation, RVSP 22,4 mmHg, moderate mitral valve regurgitation, moderate aortic valve regurgitation. Severe aortic root dilatation.   Patient was placed on IV furosemide  for diuresis, negative fluid balance was achieved, -  3,098 ml and a documented weight loss of 8 Kg, with significant improvement in his symptoms.   Continue with SGLT 2 inh, spironolactone  and entresto . Continue bisoprolol .  Diuresis at home with torsemide 60 mg daily and take 80 mg in case of volume overload.  Will need close follow up as outpatient.    Hypertension, benign Continue  blood pressure control with entresto , bisoprolol  and spironolactone    Atrial fibrillation (HCC) Continue amiodarone  and bisoprolol  for rate and rhythm control  Anticoagulation with rivaroxaban .   EP consulted, device interrogation noted atrial fibrillation, apparently received ATP therapy for his irregular rhythm with slowing the rhythm.  Electrolytes were corrected  CKD stage 3b, GFR 30-44 ml/min (HCC) Hypokalemia. Base serum cr  to 2.0 to 2,5   Patient tolerated well aggressive diuresis with IV furosemide . A the time of his discharge his serum cr is 2,47, with K at 3,7 and serum bicarbonate at 25  Na 142 and Mg 1,8   Patient will receive Kcl 20 meq and 2 g mag sulfate prior to his discharge Plan to continue diuresis with torsemide and SGLT 2 inh He has been tolerating well mineralocorticoid receptor blocker with spironolactone  Close follow up renal function and electrolytes as outpatient   Iron  deficiency anemia Cell count stable.   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 (March 2018)  He had chemoradiation and immunotherapy completed in August 2019. (Old records personally reviewed)   Follow up as outpatient with Dr Sherrod.  Port in right internal jugular vein   Obesity, class 3 Calculated BMI is 41.3        Consultants: EP (cardiology in the ED)  Procedures performed: none   Disposition: Home Diet recommendation:  Cardiac diet DISCHARGE MEDICATION: Allergies as of 09/28/2023   No Known Allergies      Medication List     STOP taking these medications    dapagliflozin  propanediol 10 MG Tabs tablet Commonly known as: Farxiga        TAKE these medications    albuterol  108 (90 Base) MCG/ACT inhaler Commonly known as:  VENTOLIN  HFA Inhale 2 puffs into the lungs every 4 (four) hours as needed for wheezing or shortness of breath.   amiodarone  200 MG tablet Commonly known as: PACERONE  Take 1 tablet (200 mg total) by mouth daily.   atorvastatin  40 MG tablet Commonly known as: LIPITOR TAKE 1 TABLET BY MOUTH EVERY DAY   bisoprolol  5 MG tablet Commonly known as: ZEBETA  TAKE 1 TABLET (5 MG TOTAL) BY MOUTH DAILY.   CVS D3 50 MCG (2000 UT) Caps Generic drug: Cholecalciferol  Take 2,000 Units by mouth daily.   Entresto  97-103 MG Generic drug: sacubitril -valsartan  Take 1 tablet by mouth 2 (two) times daily.   ferrous gluconate  324 MG tablet Commonly known as: FERGON TAKE 1 TABLET BY MOUTH THREE TIMES A DAY WITH MEALS   furosemide  40 MG tablet Commonly known as: LASIX  Take 1.5 tablets (60 mg total) by mouth daily. Take 2 tables in case of weight gain 2 to 3 lbs in 24 hrs or 5 lbs in 7 days What changed:  how much to take how to take this when to take this additional instructions   Jardiance  10 MG Tabs tablet Generic drug: empagliflozin  Take 10 mg by mouth daily.  levothyroxine  50 MCG tablet Commonly known as: SYNTHROID  TAKE 1 TABLET BY MOUTH EVERY DAY BEFORE BREAKFAST   magnesium  oxide 400 (241.3 Mg) MG tablet Commonly known as: MAG-OX Take 1 tablet (400 mg total) by mouth daily.   Nexlizet  180-10 MG Tabs Generic drug: Bempedoic Acid-Ezetimibe Take 1 tablet by mouth daily in the afternoon.   Potassium Chloride  ER 20 MEQ Tbcr TAKE 1 TABLET BY MOUTH EVERY DAY   REFRESH OP Place 1 drop into both eyes every morning.   sildenafil  100 MG tablet Commonly known as: VIAGRA  TAKE 1 TABLET BY MOUTH EVERY DAY AS NEEDED FOR ERECTILE DYSFUNCTION   spironolactone  25 MG tablet Commonly known as: ALDACTONE  TAKE 1 TABLET (25 MG TOTAL) BY MOUTH DAILY.   Trelegy Ellipta  100-62.5-25 MCG/ACT Aepb Generic drug: Fluticasone -Umeclidin-Vilant INHALE 1 PUFF BY MOUTH EVERY DAY   Uloric  40 MG  tablet Generic drug: febuxostat  Take 1 tablet (40 mg total) by mouth daily.   Xarelto  20 MG Tabs tablet Generic drug: rivaroxaban  TAKE 1 TABLET BY MOUTH EVERY DAY        Follow-up Information     [facility]. Go in 7 day(s).   Specialty: Cardiology Why: Hospital follow up 10/04/2023 @ 2:45 pm PLEASE bring  current medication list to appointment FREE valet parking, Entrance mC, poff [address] [facility]OOK [facility] and [facility] entrance Contact information: [address] [place]  [phone]               Discharge Exam: Fredricka Weights   09/26/23 1423 09/27/23 1344 09/28/23 0502  Weight: (!) 142 kg 135.8 kg 134.4 kg   BP 138/62 (BP Location: Left Arm)   Pulse 81   Temp 98.3 F (36.8 C) (Oral)   Resp (!) 23   Ht 6' 1 (1.854 m)   Wt 134.4 kg   SpO2 96%   BMI 39.09 kg/m   Patient is feeling better, no chest pain, no dyspnea, no PND or orthopnea  Neurology awake and alert ENT with no pallor Cardiovascular with S1 and S2 present, with no gallops or rubs, positive systolic murmur at the apex No JVD Respiratory with no wheezing or rales, no rhonchi  Abdomen with no distention  No ankle edema, he does mild component of lymphedema,  Condition at discharge: stable  The results of significant diagnostics from this hospitalization (including imaging, microbiology, ancillary and laboratory) are listed below for reference.   Imaging Studies: DG Chest 2 View Result Date: 09/26/2023 CLINICAL DATA:  sob EXAM: CHEST - 2 VIEW COMPARISON:  July 06, 2021 FINDINGS: Right chest port in place terminating in the lower SVC. Left chest pacemaker/AICD with a single lead terminating in the right ventricle. Lower lung volumes. No focal airspace consolidation, pleural effusion, or pneumothorax. No cardiomegaly. Tortuous aorta with aortic atherosclerosis. No acute fracture or destructive lesions.  Multilevel thoracic osteophytosis. IMPRESSION: 1. Lower lung volumes with streaky atelectasis in both lung bases. No pneumonia or pulmonary edema. 2. Left chest pacemaker/AICD with a single lead terminating in the right ventricle. Of note, there is angulation and kinking of the mid lead just below the clavicle. No lead fracture or discontinuity visualized. Electronically Signed   By: Rogelia Myers M.D.   On: 09/26/2023 15:29    Microbiology: Results for orders placed or performed during the hospital encounter of 09/20/20  SARS CORONAVIRUS 2 (TAT 6-24 HRS) Nasopharyngeal Nasopharyngeal Swab     Status: None   Collection Time: 09/20/20  8:57 AM   Specimen: Nasopharyngeal Swab  Result Value Ref Range Status   SARS Coronavirus 2 NEGATIVE NEGATIVE Final    Comment: (NOTE) SARS-CoV-2 target nucleic acids are NOT DETECTED.  The SARS-CoV-2 RNA is generally detectable in upper and lower respiratory specimens during the acute phase of infection. Negative results do not preclude SARS-CoV-2 infection, do not rule out co-infections with other pathogens, and should not be used as the sole basis for treatment or other patient management decisions. Negative results must be combined with clinical observations, patient history, and epidemiological information. The expected result is Negative.  Fact Sheet for Patients: HairSlick.no  Fact Sheet for Healthcare Providers: quierodirigir.com  This test is not yet approved or cleared by the [place]  FDA and  has been authorized for detection and/or diagnosis of SARS-CoV-2 by FDA under an Emergency Use Authorization (EUA). This EUA will remain  in effect (meaning this test can be used) for the duration of the COVID-19 declaration under Se ction 564(b)(1) of the Act, 21 U.S.C. section 360bbb-3(b)(1), unless the authorization is terminated or revoked sooner.  Performed at [facility],  [address] [place], [place] [address]    *Note: Due to a large number of results and/or encounters for the requested time period, some results have not been displayed. A complete set of results can be found in Results Review.    Labs: CBC: Recent Labs  Lab 09/26/23 1424 09/27/23 0509  WBC 8.3 10.0  NEUTROABS 6.5  --   HGB 11.5* 12.0*  HCT 36.9* 38.3*  MCV 96.1 95.5  PLT 194 201   Basic Metabolic Panel: Recent Labs  Lab 09/26/23 1424 09/27/23 0509 09/28/23 0318  NA 142 140 142  K 4.1 3.2* 3.7  CL 112* 107 104  CO2 18* 22 25  GLUCOSE 100* 138* 108*  BUN 19 21 29*  CREATININE 2.01* 2.08* 2.47*  CALCIUM  8.9 8.8* 9.4  MG  --  2.0 1.8   Liver Function Tests: No results for input(s): AST, ALT, ALKPHOS, BILITOT, PROT, ALBUMIN in the last 168 hours. CBG: No results for input(s): GLUCAP in the last 168 hours.  Discharge time spent: greater than 30 minutes.  Signed: Elidia Toribio Furnace, MD [facility] 09/28/2023

## 2023-10-01 DIAGNOSIS — M5451 Vertebrogenic low back pain: Secondary | ICD-10-CM | POA: Diagnosis not present

## 2023-10-01 DIAGNOSIS — M47896 Other spondylosis, lumbar region: Secondary | ICD-10-CM | POA: Diagnosis not present

## 2023-10-02 DIAGNOSIS — E782 Mixed hyperlipidemia: Secondary | ICD-10-CM | POA: Diagnosis not present

## 2023-10-02 DIAGNOSIS — I4891 Unspecified atrial fibrillation: Secondary | ICD-10-CM | POA: Diagnosis not present

## 2023-10-02 DIAGNOSIS — D509 Iron deficiency anemia, unspecified: Secondary | ICD-10-CM | POA: Diagnosis not present

## 2023-10-02 DIAGNOSIS — E038 Other specified hypothyroidism: Secondary | ICD-10-CM | POA: Diagnosis not present

## 2023-10-02 DIAGNOSIS — I130 Hypertensive heart and chronic kidney disease with heart failure and stage 1 through stage 4 chronic kidney disease, or unspecified chronic kidney disease: Secondary | ICD-10-CM | POA: Diagnosis not present

## 2023-10-02 DIAGNOSIS — N1832 Chronic kidney disease, stage 3b: Secondary | ICD-10-CM | POA: Diagnosis not present

## 2023-10-04 ENCOUNTER — Ambulatory Visit (HOSPITAL_COMMUNITY)
Admit: 2023-10-04 | Discharge: 2023-10-04 | Disposition: A | Discharge status: Home / Self Care | Attending: Cardiology | Admitting: Cardiology

## 2023-10-04 ENCOUNTER — Ambulatory Visit (HOSPITAL_COMMUNITY): Payer: Self-pay | Admitting: Cardiology

## 2023-10-04 VITALS — BP 118/70 | HR 60 | Wt 296.8 lb

## 2023-10-04 DIAGNOSIS — Z79899 Other long term (current) drug therapy: Secondary | ICD-10-CM | POA: Insufficient documentation

## 2023-10-04 DIAGNOSIS — N189 Chronic kidney disease, unspecified: Secondary | ICD-10-CM | POA: Insufficient documentation

## 2023-10-04 DIAGNOSIS — Z7901 Long term (current) use of anticoagulants: Secondary | ICD-10-CM | POA: Diagnosis not present

## 2023-10-04 DIAGNOSIS — I428 Other cardiomyopathies: Secondary | ICD-10-CM | POA: Insufficient documentation

## 2023-10-04 DIAGNOSIS — I5022 Chronic systolic (congestive) heart failure: Secondary | ICD-10-CM | POA: Insufficient documentation

## 2023-10-04 DIAGNOSIS — I7121 Aneurysm of the ascending aorta, without rupture: Secondary | ICD-10-CM | POA: Diagnosis not present

## 2023-10-04 DIAGNOSIS — Z95810 Presence of automatic (implantable) cardiac defibrillator: Secondary | ICD-10-CM | POA: Diagnosis not present

## 2023-10-04 DIAGNOSIS — Z87891 Personal history of nicotine dependence: Secondary | ICD-10-CM | POA: Diagnosis not present

## 2023-10-04 DIAGNOSIS — I340 Nonrheumatic mitral (valve) insufficiency: Secondary | ICD-10-CM | POA: Insufficient documentation

## 2023-10-04 DIAGNOSIS — Z85118 Personal history of other malignant neoplasm of bronchus and lung: Secondary | ICD-10-CM | POA: Insufficient documentation

## 2023-10-04 DIAGNOSIS — I480 Paroxysmal atrial fibrillation: Secondary | ICD-10-CM | POA: Diagnosis not present

## 2023-10-04 DIAGNOSIS — G4733 Obstructive sleep apnea (adult) (pediatric): Secondary | ICD-10-CM | POA: Diagnosis not present

## 2023-10-04 DIAGNOSIS — I351 Nonrheumatic aortic (valve) insufficiency: Secondary | ICD-10-CM | POA: Diagnosis not present

## 2023-10-04 DIAGNOSIS — I130 Hypertensive heart and chronic kidney disease with heart failure and stage 1 through stage 4 chronic kidney disease, or unspecified chronic kidney disease: Secondary | ICD-10-CM | POA: Insufficient documentation

## 2023-10-04 LAB — BASIC METABOLIC PANEL WITH GFR
Anion gap: 13 (ref 5–15)
BUN: 63 mg/dL — ABNORMAL HIGH (ref 8–23)
CO2: 24 mmol/L (ref 22–32)
Calcium: 9.7 mg/dL (ref 8.9–10.3)
Chloride: 103 mmol/L (ref 98–111)
Creatinine, Ser: 3.35 mg/dL — ABNORMAL HIGH (ref 0.61–1.24)
GFR, Estimated: 19 mL/min — ABNORMAL LOW (ref >60)
Glucose, Bld: 109 mg/dL — ABNORMAL HIGH (ref 70–99)
Potassium: 3.8 mmol/L (ref 3.5–5.1)
Sodium: 140 mmol/L (ref 135–145)

## 2023-10-04 MED ORDER — FUROSEMIDE 20 MG PO TABS: 60.0000 mg | ORAL_TABLET | Freq: Every day | ORAL | 3 refills | Status: DC

## 2023-10-04 NOTE — Progress Notes (Signed)
 HEART & VASCULAR TRANSITION OF CARE CONSULT NOTE     Referring Physician: Dr. Noralee   Chief Complaint: F/u for systolic heart failure   HPI: Referred to clinic by Dr. Arrien for heart failure consultation.   Jason Russell is a 73 y.o. male w/ h/o chronic systolic heart failure dating back to at least 2014. EFs have fluctuated between 30-45%. Presumed to be NICM. I don't see that he has ever had a heart catheterization but he had reported stress test in the past, which according to pt was ok. He has an ICD followed by Dr. [name]. Also h/o VT s/p VT ablation. He has CKD, followed by nephrology at [facility] and h/o HTN. He has prior h/o lung cancer treated in 2018. Former smoker, quit when he got his Lung CA diagnosis.   He had recent f/u echo 4/25 showing stable EF 35-40%, RV normal. He was noted to have severe dilation of the aortic root, measuring 5 cm and mild dilation of the ascending aorta, at 4 cm. Moderate AI and Mod MR also noted. Pt is now being followed by CT surgery.   On 6/25, he was informed by device clinic to go to the ED given multiple episodes (33) of  VT. He received ATP therapy which slowed the rhythm. On further investigation by EP it was felt that the arrhythmias were actually afib and he received inappropriate ATP for AF. He was back in NSR when evaluated by EP. Optivol also suggested volume accumulation. CXR also showed vascular congestion. EP adjusted device settings. He was diuresed w/ IV Lasix  and continued on amiodarone . PO Lasix  was increased from 60 mg to 80 mg daily at d/c.   Today in f/u, he reports feeling better. He denies any resting or exertional dyspnea. No chest pain, palpitations, LEE, orthopnea or PND. Sleeps w/ CPAP. Compliant w/ meds. BP well controlled. Device interrogation shows no more detected arrhyhtmias. Optivol shows improved volume status, though may be dry. Optivol impedence is way up.  Of note, Optivol is followed monthly by device  clinic.      Cardiac Testing   1. Left ventricular ejection fraction, by estimation, is 35 to 40%. The  left ventricle has moderately decreased function. The left ventricle  demonstrates global hypokinesis. The left ventricular internal cavity size  was moderately to severely dilated.  There is mild eccentric left ventricular hypertrophy. Left ventricular  diastolic parameters are consistent with Grade I diastolic dysfunction  (impaired relaxation).   2. Right ventricular systolic function is normal. The right ventricular  size is normal. Tricuspid regurgitation signal is inadequate for assessing  PA pressure.   3. Left atrial size was moderately dilated.   4. Right atrial size was mildly dilated.   5. The mitral valve is normal in structure. Moderate mitral valve  regurgitation.   6. The aortic valve is tricuspid. Aortic valve regurgitation is moderate.   7. Aortic dilatation noted. There is severe dilatation of the aortic  root, measuring 50 mm. There is mild dilatation of the ascending aorta,  measuring 40 mm.   8. The inferior vena cava is normal in size with greater than 50%  respiratory variability, suggesting right atrial pressure of 3 mmHg.    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 St. Jude ICD implantation 10/03/12.   Chronic anticoagulation    Chronic fatigue 10/18/2016   Chronic systolic heart failure (HCC)    a. Echo 7/13: EF 25%;  b. echo 04/2012:  Mild LVH, EF 30-35%, Gr 1 DD, mild AI, mild MR, mild LAE   COPD (chronic obstructive pulmonary disease) (HCC)    Dyslipidemia    Gout    History of blood transfusion 10/15/2013   don't know where the blood's going; HgB down to 5   Hyperlipidemia     Hypertension    Hypothyroidism    NICM (nonischemic cardiomyopathy) (HCC)    LHC 4/14:  minimal CAD   Obesity    OSA on CPAP    Thoracic ascending aortic aneurysm (HCC) 07/25/2023   CT 07/25/23: TAA 4.1 cm; 7 mm LUL nodule    Tobacco abuse     Current Outpatient Medications  Medication Sig Dispense Refill   albuterol  (VENTOLIN  HFA) 108 (90 Base) MCG/ACT inhaler Inhale 2 puffs into the lungs every 4 (four) hours as needed for wheezing or shortness of breath. 8.5 g 5   amiodarone  (PACERONE ) 200 MG tablet Take 1 tablet (200 mg total) by mouth daily. 90 tablet 0   atorvastatin  (LIPITOR) 40 MG tablet TAKE 1 TABLET BY MOUTH EVERY DAY 90 tablet 3   Bempedoic Acid-Ezetimibe (NEXLIZET ) 180-10 MG TABS Take 1 tablet by mouth daily in the afternoon. 30 tablet 11   bisoprolol  (ZEBETA ) 5 MG tablet TAKE 1 TABLET (5 MG TOTAL) BY MOUTH DAILY. 90 tablet 3   CVS D3 2000 units CAPS Take 2,000 Units by mouth daily.   11   ferrous gluconate  (FERGON) 324 MG tablet TAKE 1 TABLET BY MOUTH THREE TIMES A DAY WITH MEALS 270 tablet 3   furosemide  (LASIX ) 40 MG tablet Take 80 mg by mouth daily.     JARDIANCE  10 MG TABS tablet Take 10 mg by mouth daily.     levothyroxine  (SYNTHROID ) 50 MCG tablet TAKE 1 TABLET BY MOUTH EVERY DAY BEFORE BREAKFAST 90 tablet 2   magnesium  oxide (MAG-OX) 400 (241.3 Mg) MG tablet Take 1 tablet (400 mg total) by mouth daily. 30 tablet 0   Polyvinyl Alcohol -Povidone (REFRESH OP) Place 1 drop into both eyes every morning.     Potassium Chloride  ER 20 MEQ TBCR TAKE 1 TABLET BY MOUTH EVERY DAY 90 tablet 2   sacubitril -valsartan  (ENTRESTO ) 97-103 MG Take 1 tablet by mouth 2 (two) times daily. 180 tablet 3   sildenafil  (VIAGRA ) 100 MG tablet TAKE 1 TABLET BY MOUTH EVERY DAY AS NEEDED FOR ERECTILE DYSFUNCTION 30 tablet 1   spironolactone  (ALDACTONE ) 25 MG tablet TAKE 1 TABLET (25 MG TOTAL) BY MOUTH DAILY. 90 tablet 2   TRELEGY ELLIPTA  100-62.5-25 MCG/ACT AEPB INHALE 1 PUFF BY MOUTH EVERY DAY  180 each 3   ULORIC  40 MG tablet Take 1 tablet (40 mg total) by mouth daily. 30 tablet 0   XARELTO  20 MG TABS tablet TAKE 1 TABLET BY MOUTH EVERY DAY 90 tablet 1   No current facility-administered medications for this encounter.    No Known Allergies    Social History   Socioeconomic History   Marital status: Widowed    Spouse name: Not on file   Number of children: 1   Years of education: Not on file   Highest education level: High school graduate  Occupational History  Occupation: retired    Associate Professor: [employer]  Tobacco Use   Smoking status: Former    Current packs/day: 0.00    Average packs/day: 0.3 packs/day for 45.0 years (11.3 ttl pk-yrs)    Types: Cigarettes    Start date: 07/12/1971    Quit date: 07/11/2016    Years since quitting: 7.2   Smokeless tobacco: Never  Vaping Use   Vaping status: Never Used  Substance and Sexual Activity   Alcohol  use: Yes    Alcohol /week: 1.0 standard drink of alcohol     Types: 1 Cans of beer per week    Comment: seldom   Drug use: Never   Sexual activity: Not Currently  Other Topics Concern   Not on file  Social History Narrative   Not on file   Social Drivers of Health   Financial Resource Strain: Low Risk  (09/27/2023)   Overall Financial Resource Strain (CARDIA)    Difficulty of Paying Living Expenses: Not hard at all  Food Insecurity: No Food Insecurity (09/27/2023)   Hunger Vital Sign    Worried About Running Out of Food in the Last Year: Never true    Ran Out of Food in the Last Year: Never true  Transportation Needs: No Transportation Needs (09/27/2023)   PRAPARE - Administrator, Civil Service (Medical): No    Lack of Transportation (Non-Medical): No  Physical Activity: Not on file  Stress: No Stress Concern Present (09/26/2021)   [employer] of Occupational Health - Occupational Stress Questionnaire    Feeling of Stress : Not at all  Social Connections: Moderately Integrated (09/27/2023)    Social Connection and Isolation Panel    Frequency of Communication with Friends and Family: More than three times a week    Frequency of Social Gatherings with Friends and Family: Three times a week    Attends Religious Services: More than 4 times per year    Active Member of Clubs or Organizations: No    Attends Banker Meetings: More than 4 times per year    Marital Status: Widowed  Intimate Partner Violence: Not At Risk (09/27/2023)   Humiliation, Afraid, Rape, and Kick questionnaire    Fear of Current or Ex-Partner: No    Emotionally Abused: No    Physically Abused: No    Sexually Abused: No      Family History  Problem Relation Age of Onset   Hypertension Mother    Heart Problems Mother    Other Father        deceased- unknow reason   Lung cancer Brother     Vitals:   10/04/23 1456  BP: 118/70  Pulse: 60  SpO2: 97%  Weight: 134.6 kg (296 lb 12.8 oz)    PHYSICAL EXAM: General:  Well appearing, obese. No respiratory difficulty HEENT: normal Neck: supple. no JVD. Carotids 2+ bilat; no bruits. No lymphadenopathy or thryomegaly appreciated. Cor: PMI nondisplaced. Regular rate & rhythm. No rubs, gallops or murmurs. Lungs: clear Abdomen: soft, nontender, nondistended. No hepatosplenomegaly. No bruits or masses. Good bowel sounds. Extremities: no cyanosis, clubbing, rash, edema Neuro: alert & oriented x 3, cranial nerves grossly intact. moves all 4 extremities w/o difficulty. Affect pleasant.  ECG: not performed    ASSESSMENT & PLAN:  1. Chronic Systolic Heart Failure - NICM. Dates back to 2014, EFs have fluctuated between 30-45% - Echo 4/25 EF 35-40%, RV normal - recent acute exacerbation 6/25, ? If triggered by afib episodes. He is  now euvolemic-dry based on Optivol  - NYHA Class II, stable  - continue Lasix  80 mg daily. Check BMP and BNP today. If Scr is up will dose reduce   - continue Entresto  97-103 mg bid (nephrology aware per pt report) -  continue spironolactone  25 mg daily (nephrology aware per pt report) - continue Jardiance  10 mg daily  - continue bisoprolol  5 mg daily  - continue monthly Optivol monitoring by device clinic   2. PAF - no recurrent afib detection on device since 6/22 - continue amiodarone  200 mg daily  - continue Xarelto    3. H/o VT - s/p ablation  - on amio, followed by EP - no recent VT on device interrogation, this was actually afib inappropriately treated w/ ATP  4. Aortic Aneurysm/ Mod AI, Mod MR  - echo 4/25 showed 5 cm aortic root + mod AI - has been referred by CT surgery. They are following  - keep BP and HR well controlled - he quit smoking  5. CKD - followed by nephrology at [facility] - baseline SCr ~2.5 - check BMP today    Referred to HFSW (PCP, Medications, Transportation, ETOH Abuse, Drug Abuse, Insurance, Financial ): no  Refer to Pharmacy: No  Refer to Home Health: No  Refer to Advanced Heart Failure Clinic: No  Refer to General Cardiology: Yes   Follow up  w/ cardiology and EP. Keep appt as planned.   Caffie Shed, PA-C 10/04/2023

## 2023-10-04 NOTE — Patient Instructions (Addendum)
 God to see you today!  Labs done today, your results will be available in MyChart, we will contact you for abnormal readings.   Thank you for allowing us  to provider your heart failure care after your recent hospitalization. Please follow-up with general cardiology as scheduled.  If you have any questions, issues, or concerns before your next appointment please call our office at [phone], opt. 2 and leave a message for the triage nurse.

## 2023-10-11 ENCOUNTER — Other Ambulatory Visit (HOSPITAL_COMMUNITY)

## 2023-10-12 ENCOUNTER — Ambulatory Visit: Attending: Cardiology | Admitting: Cardiology

## 2023-10-12 ENCOUNTER — Encounter: Payer: Self-pay | Admitting: Cardiology

## 2023-10-12 VITALS — BP 120/80 | HR 61 | Ht 73.0 in | Wt 299.0 lb

## 2023-10-12 DIAGNOSIS — I4891 Unspecified atrial fibrillation: Secondary | ICD-10-CM | POA: Diagnosis not present

## 2023-10-12 DIAGNOSIS — I5022 Chronic systolic (congestive) heart failure: Secondary | ICD-10-CM

## 2023-10-12 DIAGNOSIS — Z79899 Other long term (current) drug therapy: Secondary | ICD-10-CM | POA: Insufficient documentation

## 2023-10-12 NOTE — Progress Notes (Signed)
 " Cardiology Office Note:   Date:  10/15/2023  ID:  Jason Russell, DOB 02-Jun-1950, MRN [id number] PCP: [name], MD  [facility] Providers Cardiologist:  Vina Gull, MD Electrophysiologist:  Danelle Birmingham, MD    History of Present Illness:   Discussed the use of AI scribe software for clinical note transcription with the patient, who gave verbal consent to proceed.  History of Present Illness Jason Russell is a 73 year old male with history of CHF, NICM, VT s/p ablation who presents for follow up.   He has a history of chronic systolic heart failure with an ejection fraction fluctuating between 30% and 45% since at least 2014, suspected non-ischemic cardiomyopathy, and has not undergone heart catheterization but has had stress tests in the past. He has an ICD and a history of ventricular tachycardia status post VT ablation. Recently, he was advised of multiple episodes of VT with ATP therapy and was advised by device clinic to go to the ED. On further investigation, it was later determined to be inappropriate as the arrhythmias were actually felt to be atrial fibrillation. His device settings were adjusted, and he was continued on amiodarone .  He has chronic kidney disease and is followed by nephrology at [facility]. After a recent visit to the heart failure clinic, his creatinine increased to 3.35. He was advised to hold Lasix  and potassium for two days and then restart at a reduced dose of 60 mg daily. He is scheduled for a repeat metabolic panel.  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 He is followed by CT surgery for this condition and has a CT scan scheduled for October.  He feels good overall. He monitors his weight daily, which has been stable around 289 pounds. No chest pain, unusual shortness of breath, swelling in the legs, dizziness, or lightheadedness. He is compliant with his medications, including  spironolactone , Entresto , magnesium  supplements, and Xarelto , with no reported side effects.     Today patient denies chest pain, shortness of breath, lower extremity edema, fatigue, palpitations, melena, hematuria, hemoptysis, diaphoresis, weakness, presyncope, syncope, orthopnea, and PND.   Studies Reviewed:    April 2025 TTE  Risk Assessment/Calculations:    CHA2DS2-VASc Score = 4   This indicates a 4.8% annual risk of stroke. The patient's score is based upon: CHF History: 1 HTN History: 1 Diabetes History: 0 Stroke History: 0 Vascular Disease History: 1 Age Score: 1 Gender Score: 0         Physical Exam:   VS:  BP 120/80   Pulse 61   Ht 6' 1 (1.854 m)   Wt 299 lb (135.6 kg)   SpO2 94%   BMI 39.45 kg/m    Wt Readings from Last 3 Encounters:  10/12/23 299 lb (135.6 kg)  10/04/23 296 lb 12.8 oz (134.6 kg)  09/28/23 296 lb 4.8 oz (134.4 kg)     Physical Exam Vitals reviewed.  Constitutional:      Appearance: Normal appearance.  HENT:     Head: Normocephalic.  Eyes:     Pupils: Pupils are equal, round, and reactive to light.  Cardiovascular:     Rate and Rhythm: Normal rate and regular rhythm.     Pulses: Normal pulses.     Heart sounds: Normal heart sounds.  Pulmonary:     Effort: Pulmonary effort is normal.     Breath sounds: Normal breath sounds.  Abdominal:  General: Abdomen is flat.     Palpations: Abdomen is soft.  Musculoskeletal:     Right lower leg: No edema.     Left lower leg: No edema.  Skin:    General: Skin is warm and dry.     Capillary Refill: Capillary refill takes less than 2 seconds.  Neurological:     General: No focal deficit present.     Mental Status: He is alert and oriented to person, place, and time.  Psychiatric:        Mood and Affect: Mood normal.        Behavior: Behavior normal.        Thought Content: Thought content normal.        Judgment: Judgment normal.      ASSESSMENT AND PLAN:    Assessment &  Plan Chronic systolic heart failure Chronic systolic heart failure with ejection fraction fluctuating between 30-45%. Recent echocardiogram in April 2025 showed EF of 35-40%, consistent with previous measurements. Recent episodes of inappropriate ATP therapy for atrial fibrillation, not VT. Currently on amiodarone  and Xarelto . No recent shocks or significant fluid retention. Blood pressure excellent. No chest pain, unusual dyspnea, or peripheral edema. - Continue amiodarone  and Xarelto  - Continue Entresto , Jardiance , Spironolactone , Lasix  60mg  daily, Bisoprolol . Patient's nephrologist is aware of patient taking Entresto , Spironolactone  with CKD. - Monitor fluid status and weight - Check metabolic panel today to assess kidney function - Follow up with EP clinic in early August - Schedule general cardiology follow-up in six months  Atrial fibrillation Recent episodes of atrial fibrillation misinterpreted as VT by ICD, leading to inappropriate ATP therapy. Currently in normal sinus rhythm. On amiodarone  and Xarelto  for rate control and anticoagulation. No recent dizziness or lightheadedness. - Continue amiodarone  and Xarelto  - Device interrogation scheduled for Monday to monitor for recurrence of atrial fibrillation - Follow up with EP clinic in early August  Acute kidney injury Acute kidney injury with recent creatinine increase to 3.35, likely secondary to diuretic use. Lasix  dose reduced to 60 mg daily after holding for two days. Awaiting repeat metabolic panel to assess kidney function improvement. 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 Under surveillance by CT surgery. No immediate surgical intervention planned, monitoring with imaging. - CT scan scheduled for October to monitor aortic root  dilation  Moderate aortic insufficiency and mitral regurgitation Moderate aortic insufficiency and mitral regurgitation noted on recent echocardiogram. No significant change in symptoms or clinical status. - Continue monitoring symptoms and clinical status          Signed, Artist Pouch, PA-C   "

## 2023-10-12 NOTE — Patient Instructions (Signed)
 Medication Instructions:  Your physician recommends that you continue on your current medications as directed. Please refer to the Current Medication list given to you today.  *If you need a refill on your cardiac medications before your next appointment, please call your pharmacy*  Lab Work: TODAY:  BMET  If you have labs (blood work) drawn today and your tests are completely normal, you will receive your results only by: MyChart Message (if you have MyChart) OR A paper copy in the mail If you have any lab test that is abnormal or we need to change your treatment, we will call you to review the results.  Testing/Procedures: None ordered  Follow-Up: At [facility], you and your health needs are our priority.  As part of our continuing mission to provide you with exceptional heart care, our providers are all part of one team.  This team includes your primary Cardiologist (physician) and Advanced Practice Providers or APPs (Physician Assistants and Nurse Practitioners) who all work together to provide you with the care you need, when you need it.  Your next appointment:   6 month(s)  Provider:   Vina Gull, MD    We recommend signing up for the patient portal called MyChart.  Sign up information is provided on this After Visit Summary.  MyChart is used to connect with patients for Virtual Visits (Telemedicine).  Patients are able to view lab/test results, encounter notes, upcoming appointments, etc.  Non-urgent messages can be sent to your provider as well.   To learn more about what you can do with MyChart, go to [email or web address].   Other Instructions

## 2023-10-13 LAB — BASIC METABOLIC PANEL WITH GFR
BUN/Creatinine Ratio: 18 (ref 10–24)
BUN: 60 mg/dL — ABNORMAL HIGH (ref 8–27)
CO2: 19 mmol/L — ABNORMAL LOW (ref 20–29)
Calcium: 9.5 mg/dL (ref 8.6–10.2)
Chloride: 106 mmol/L (ref 96–106)
Creatinine, Ser: 3.34 mg/dL — ABNORMAL HIGH (ref 0.76–1.27)
Glucose: 98 mg/dL (ref 70–99)
Potassium: 5.2 mmol/L (ref 3.5–5.2)
Sodium: 142 mmol/L (ref 134–144)
eGFR: 19 mL/min/1.73 — ABNORMAL LOW (ref >59)

## 2023-10-15 ENCOUNTER — Ambulatory Visit: Payer: Medicare Other

## 2023-10-15 ENCOUNTER — Ambulatory Visit: Payer: Self-pay | Admitting: Internal Medicine

## 2023-10-15 ENCOUNTER — Ambulatory Visit: Payer: Self-pay | Admitting: *Deleted

## 2023-10-15 DIAGNOSIS — I428 Other cardiomyopathies: Secondary | ICD-10-CM

## 2023-10-15 DIAGNOSIS — N1832 Chronic kidney disease, stage 3b: Secondary | ICD-10-CM

## 2023-10-15 DIAGNOSIS — Z7901 Long term (current) use of anticoagulants: Secondary | ICD-10-CM

## 2023-10-15 DIAGNOSIS — Z79899 Other long term (current) drug therapy: Secondary | ICD-10-CM

## 2023-10-15 LAB — CUP PACEART REMOTE DEVICE CHECK
Battery Remaining Longevity: 54 mo
Battery Remaining Percentage: 50 %
Battery Voltage: 2.96 V
Brady Statistic RV Percent Paced: 1 %
Date Time Interrogation Session: 20250714020016
HighPow Impedance: 44 Ohm
HighPow Impedance: 44 Ohm
Implantable Lead Connection Status: 753985
Implantable Lead Implant Date: 20140703
Implantable Lead Location: 753860
Implantable Pulse Generator Implant Date: 20191113
Lead Channel Impedance Value: 410 Ohm
Lead Channel Pacing Threshold Amplitude: 0.75 V
Lead Channel Pacing Threshold Pulse Width: 0.5 ms
Lead Channel Sensing Intrinsic Amplitude: 4.4 mV
Lead Channel Setting Pacing Amplitude: 2.5 V
Lead Channel Setting Pacing Pulse Width: 0.5 ms
Lead Channel Setting Sensing Sensitivity: 0.5 mV
Pulse Gen Serial Number: 9820959
Zone Setting Status: 755011

## 2023-10-15 MED ORDER — RIVAROXABAN 15 MG PO TABS: 15.0000 mg | ORAL_TABLET | Freq: Every day | ORAL | 1 refills | Status: DC

## 2023-10-15 NOTE — Telephone Encounter (Signed)
 Trudy Birmingham, PA-C to Me  (Selected Message)    10/15/23  1:02 PM Result Note Creatinine remains significantly elevated. Will have patient hold Spironolactone , Lasix  and potassium supplement indefinitely pending response from his nephrologist. We also need to dose adjust his Xarelto , now should take 15mg  daily. I have personally spoken with patient about these directives. I have reached out to his nephrology office, nursing staff there have routed high priority message to his nephrologist. Will plan to repeat a BMET on Thursday of this week.   Please send Rx for Xarelto  15mg , stop 20mg  dose. Please arrange BMET for Thursday 7/17.   The patient has been notified of the result and verbalized understanding.  All questions (if any) were answered.  Pt aware we will decrease his xalreto to 15 mg po daily with supper and I will place a order for him to come this Thursday for repeat BMET.    Confirmed the pharmacy of choice with the pt.   Pt verbalized understanding and agrees with this plan.

## 2023-10-16 ENCOUNTER — Encounter (HOSPITAL_COMMUNITY): Payer: Self-pay

## 2023-10-16 ENCOUNTER — Other Ambulatory Visit (HOSPITAL_COMMUNITY)

## 2023-10-16 ENCOUNTER — Ambulatory Visit: Payer: Self-pay | Admitting: Physician Assistant

## 2023-10-16 NOTE — Telephone Encounter (Signed)
 You attempted to contact Jerilynn Sharper (Completed) Lucien Orren SAILOR, PA-C to Me (Selected Message)    10/16/23  5:13 PM Result Note Mr. Andre,    Your thoracic aortic aneurysm is measuring the same as your last CT scan (4.1 cm).  There was a new noncalcified pulmonary nodule present in the left upper lobe measuring 7mm.  I would discuss with your primary care and see if you need pulmonology follow-up.   Orren SAILOR Lucien, PA-C   The patient has been notified of the result and verbalized understanding.  All questions (if any) were answered.  Pt states the nodule is not a new finding but he will have his Oncologist Dr. Sherrod review this, when he see's him for follow-up in August.  Pt aware I will route this report to both Dr. Sherrod and his PCP for their review.  Pt verbalized understanding and agrees with this plan.

## 2023-10-17 DIAGNOSIS — H04123 Dry eye syndrome of bilateral lacrimal glands: Secondary | ICD-10-CM | POA: Diagnosis not present

## 2023-10-17 DIAGNOSIS — H40013 Open angle with borderline findings, low risk, bilateral: Secondary | ICD-10-CM | POA: Diagnosis not present

## 2023-10-17 DIAGNOSIS — Z961 Presence of intraocular lens: Secondary | ICD-10-CM | POA: Diagnosis not present

## 2023-10-18 ENCOUNTER — Encounter: Payer: Self-pay | Admitting: Internal Medicine

## 2023-10-18 DIAGNOSIS — Z79899 Other long term (current) drug therapy: Secondary | ICD-10-CM | POA: Diagnosis not present

## 2023-10-18 DIAGNOSIS — N1832 Chronic kidney disease, stage 3b: Secondary | ICD-10-CM | POA: Diagnosis not present

## 2023-10-18 DIAGNOSIS — Z7901 Long term (current) use of anticoagulants: Secondary | ICD-10-CM | POA: Diagnosis not present

## 2023-10-19 ENCOUNTER — Telehealth: Payer: Self-pay | Admitting: Internal Medicine

## 2023-10-19 ENCOUNTER — Other Ambulatory Visit (HOSPITAL_COMMUNITY): Payer: Self-pay | Admitting: Cardiology

## 2023-10-19 LAB — BASIC METABOLIC PANEL WITH GFR
BUN/Creatinine Ratio: 21 (ref 10–24)
BUN: 51 mg/dL — ABNORMAL HIGH (ref 8–27)
CO2: 20 mmol/L (ref 20–29)
Calcium: 9.4 mg/dL (ref 8.6–10.2)
Chloride: 107 mmol/L — ABNORMAL HIGH (ref 96–106)
Creatinine, Ser: 2.46 mg/dL — ABNORMAL HIGH (ref 0.76–1.27)
Glucose: 93 mg/dL (ref 70–99)
Potassium: 4.1 mmol/L (ref 3.5–5.2)
Sodium: 142 mmol/L (ref 134–144)
eGFR: 27 mL/min/1.73 — ABNORMAL LOW (ref >59)

## 2023-10-19 MED ORDER — POTASSIUM CHLORIDE CRYS ER 20 MEQ PO TBCR: EXTENDED_RELEASE_TABLET | ORAL | 3 refills | Status: DC

## 2023-10-19 MED ORDER — FUROSEMIDE 20 MG PO TABS: ORAL_TABLET | ORAL | 0 refills | Status: AC

## 2023-10-19 NOTE — Telephone Encounter (Signed)
Patient returned RN's call regarding lab results.

## 2023-10-19 NOTE — Telephone Encounter (Signed)
-----   Message from Nurse Porter HERO sent at 10/19/2023 12:05 PM EDT -----  ----- Message ----- From: Trudy Birmingham, PA-C Sent: 10/19/2023   9:17 AM EDT To: Porter HERO Lunger, LPN  Metabolic panel shows much improved kidney function, now near baseline. I recommend that at the current time, patient take lasix  60mg  AS NEEDED for weight gain >3-4 pounds in 24 hours. Please have  him take potassium supplement on days he also takes Lasix . Continue to hold Spironolactone . ----- Message ----- From: Interface, [facility] Lab Results In Sent: 10/19/2023   2:35 AM EDT To: Birmingham Trudy, PA-C

## 2023-10-19 NOTE — Addendum Note (Signed)
 Addended by: KRASOWSKI, Dalani Mette on: 10/19/2023 01:35 PM   Modules accepted: Orders

## 2023-10-19 NOTE — Telephone Encounter (Signed)
 Corean Fitch, RN 10/19/2023  1:35 PM EDT Back to Top    The patient has been notified of the result and verbalized understanding. All questions (if any) were answered. Medication changes were also reflected in his medication list.    Tayyiba  Krasowski, RN 10/19/2023 1:34 PM     Porter CHRISTELLA Lunger, LPN 2/81/7974 87:81 PM EDT     Left the pt a message to call the office back.   Artist Pouch, PA-C 10/19/2023  9:17 AM EDT     Metabolic panel shows much improved kidney function, now near baseline. I recommend that at the current time, patient take lasix  60mg  AS NEEDED for weight gain >3-4 pounds in 24 hours. Please have him take potassium supplement on days he also takes Lasix . Continue to hold Spironolactone .

## 2023-10-19 NOTE — Telephone Encounter (Signed)
 The patient has been notified of the result and verbalized understanding. All questions (if any) were answered. Medication changes were also reflected in his medication list.   Ilya Neely  Krasowski, RN 10/19/2023 1:34 PM

## 2023-10-26 DIAGNOSIS — I129 Hypertensive chronic kidney disease with stage 1 through stage 4 chronic kidney disease, or unspecified chronic kidney disease: Secondary | ICD-10-CM | POA: Diagnosis not present

## 2023-10-26 DIAGNOSIS — N183 Chronic kidney disease, stage 3 unspecified: Secondary | ICD-10-CM | POA: Diagnosis not present

## 2023-10-26 DIAGNOSIS — N184 Chronic kidney disease, stage 4 (severe): Secondary | ICD-10-CM | POA: Diagnosis not present

## 2023-10-29 ENCOUNTER — Ambulatory Visit: Attending: Internal Medicine

## 2023-10-29 DIAGNOSIS — I5022 Chronic systolic (congestive) heart failure: Secondary | ICD-10-CM

## 2023-10-29 DIAGNOSIS — N1832 Chronic kidney disease, stage 3b: Secondary | ICD-10-CM | POA: Diagnosis not present

## 2023-10-29 DIAGNOSIS — Z95810 Presence of automatic (implantable) cardiac defibrillator: Secondary | ICD-10-CM

## 2023-10-29 DIAGNOSIS — M898X9 Other specified disorders of bone, unspecified site: Secondary | ICD-10-CM | POA: Diagnosis not present

## 2023-10-29 DIAGNOSIS — D631 Anemia in chronic kidney disease: Secondary | ICD-10-CM | POA: Diagnosis not present

## 2023-10-29 DIAGNOSIS — I1310 Hypertensive heart and chronic kidney disease without heart failure, with stage 1 through stage 4 chronic kidney disease, or unspecified chronic kidney disease: Secondary | ICD-10-CM | POA: Diagnosis not present

## 2023-10-29 DIAGNOSIS — E559 Vitamin D deficiency, unspecified: Secondary | ICD-10-CM | POA: Diagnosis not present

## 2023-10-29 DIAGNOSIS — E876 Hypokalemia: Secondary | ICD-10-CM | POA: Diagnosis not present

## 2023-10-29 DIAGNOSIS — I129 Hypertensive chronic kidney disease with stage 1 through stage 4 chronic kidney disease, or unspecified chronic kidney disease: Secondary | ICD-10-CM | POA: Diagnosis not present

## 2023-10-30 DIAGNOSIS — E1122 Type 2 diabetes mellitus with diabetic chronic kidney disease: Secondary | ICD-10-CM | POA: Diagnosis not present

## 2023-10-30 DIAGNOSIS — I110 Hypertensive heart disease with heart failure: Secondary | ICD-10-CM | POA: Diagnosis not present

## 2023-10-30 DIAGNOSIS — I509 Heart failure, unspecified: Secondary | ICD-10-CM | POA: Diagnosis not present

## 2023-10-30 DIAGNOSIS — N183 Chronic kidney disease, stage 3 unspecified: Secondary | ICD-10-CM | POA: Diagnosis not present

## 2023-10-30 NOTE — Progress Notes (Signed)
 EPIC Encounter for ICM Monitoring  Patient Name: Jason Russell is a 73 y.o. male Date: 10/30/2023 Primary Care Physican: [name], MD Primary Cardiologist: [name] Electrophysiologist: [name] 09/13/2022 Weight: 280 lbs 05/21/2022 Weight: 288 lbs 06/25/2023 Weight: 288 lbs 09/05/2023 Weight: 290 lbs 10/30/2023 Weight: 286 lbs (Highest weight last week was 290 lbs).   Spoke with patient and heart failure questions reviewed.  Transmission results reviewed.  Pt reports feeling SOB before hospitalization but no symptoms last week during decreased impedance.     Diet:  Cooks at home most of the time and occasionally eats out a meal.     CorVue Thoracic impedance suggesting possible fluid accumulation from 6/15-6/25 (hospitalized for CHF) followed possible dryness from 6/25-7/7 (after being diuresed during hospitalization).  Also suggesting possible fluid accumulation returned 7/15-7/27.   Prescribed:  Furosemide  20 mg take 3 tablet(s) (60 mg total) by mouth daily as needed for weight gain of 3-4 pounds within 24 hours.  7/29 Pt reports increased to 40 mg bid by Nephrologist at 7/28 OV (was taking 60 mg daily prior to Nephrologist appt) Potassium 20 mEq 1 tablet by mouth daily as needed when you take Lasix  as needed.   7/29 Pt reports increased to 40 mEq by mouth daily by Nephrologist at 7/28 OV.   Labs: 10/26/2023 Creatinine 2.50, BUN 39, Potassium 3.4, Sodium 142, GFR 27  10/18/2023 Creatinine 2.46, BUN 51, Potassium 4.1, Sodium 142, GFR 27  10/12/2023 Creatinine 3.34, BUN 60, Potassium 5.2, Sodium 142, GFR 19  10/04/2023 Creatinine 3.35, BUN 63, Potassium 3.8, Sodium 140 09/28/2023 Creatinine 2.47, BUN 29, Potassium 3.7, Sodium 142  09/27/2023 Creatinine 2.08, BUN 21, Potassium 3.2, Sodium 140  09/26/2023 Creatinine 2.01, BUN 19, Potassium 4.1, Sodium 142  A complete set of results can be found in results review   Recommendations:  No changes and encouraged to call if experiencing any fluid  symptoms.  Copy sent to Artist Pouch, PA for review at 7/30 OV.   Follow-up plan: ICM clinic phone appointment on 11/05/2023 to recheck fluid levels.   91 day device clinic remote transmission 01/14/2024.     EP/Cardiology Office Visits:  10/31/2023 with Artist Pouch, PA.   Recall 12/05/2023 with Dr [name].  11/07/2023 with Dr [name].    Copy of ICM check sent to Dr. [name].  3 month ICM trend: 10/29/2023.    12-14 Month ICM trend:     [name], RN 10/30/2023 2:32 PM

## 2023-10-31 ENCOUNTER — Ambulatory Visit: Attending: Cardiology | Admitting: Cardiology

## 2023-10-31 ENCOUNTER — Encounter: Payer: Self-pay | Admitting: Cardiology

## 2023-10-31 VITALS — BP 130/50 | HR 62 | Ht 73.0 in | Wt 300.4 lb

## 2023-10-31 DIAGNOSIS — I7121 Aneurysm of the ascending aorta, without rupture: Secondary | ICD-10-CM | POA: Insufficient documentation

## 2023-10-31 DIAGNOSIS — I472 Ventricular tachycardia, unspecified: Secondary | ICD-10-CM | POA: Insufficient documentation

## 2023-10-31 DIAGNOSIS — I429 Cardiomyopathy, unspecified: Secondary | ICD-10-CM | POA: Insufficient documentation

## 2023-10-31 DIAGNOSIS — N1832 Chronic kidney disease, stage 3b: Secondary | ICD-10-CM | POA: Diagnosis not present

## 2023-10-31 DIAGNOSIS — R911 Solitary pulmonary nodule: Secondary | ICD-10-CM | POA: Insufficient documentation

## 2023-10-31 DIAGNOSIS — R072 Precordial pain: Secondary | ICD-10-CM | POA: Diagnosis not present

## 2023-10-31 DIAGNOSIS — Z7901 Long term (current) use of anticoagulants: Secondary | ICD-10-CM | POA: Insufficient documentation

## 2023-10-31 DIAGNOSIS — E785 Hyperlipidemia, unspecified: Secondary | ICD-10-CM | POA: Insufficient documentation

## 2023-10-31 DIAGNOSIS — Z79899 Other long term (current) drug therapy: Secondary | ICD-10-CM | POA: Insufficient documentation

## 2023-10-31 DIAGNOSIS — I77819 Aortic ectasia, unspecified site: Secondary | ICD-10-CM | POA: Insufficient documentation

## 2023-10-31 DIAGNOSIS — Z95810 Presence of automatic (implantable) cardiac defibrillator: Secondary | ICD-10-CM | POA: Diagnosis not present

## 2023-10-31 DIAGNOSIS — I5022 Chronic systolic (congestive) heart failure: Secondary | ICD-10-CM | POA: Diagnosis not present

## 2023-10-31 NOTE — Patient Instructions (Signed)
 Medication Instructions:  Please follow up with nephrology   *If you need a refill on your cardiac medications before your next appointment, please call your pharmacy*  Testing/Procedures: [facility]  Your physician has requested that you have a lexiscan myoview. For further information please visit [email or web address] Please follow instruction sheet, as given.   Follow-Up: At [facility], you and your health needs are our priority.  As part of our continuing mission to provide you with exceptional heart care, our providers are all part of one team.  This team includes your primary Cardiologist (physician) and Advanced Practice Providers or APPs (Physician Assistants and Nurse Practitioners) who all work together to provide you with the care you need, when you need it.  Your next appointment:   02/2024 OR 03/2024   Provider:   Vina Gull, MD

## 2023-10-31 NOTE — Progress Notes (Signed)
 Cardiology Office Note:   Date:  10/31/2023  ID:  Jason Russell, DOB Jul 19, 1950, MRN [id number] PCP: [name], MD  [facility] Providers Cardiologist:  Vina Gull, MD Electrophysiologist:  Danelle Birmingham, MD    History of Present Illness:   Discussed the use of AI scribe software for clinical note transcription with the patient, who gave verbal consent to proceed.  History of Present Illness  Jason Russell is a 73 year old male with history of CHF, NICM, VT s/p ablation who presents for follow up, primarily of recent AKI following admission.    He has a history of chronic systolic heart failure with an ejection fraction fluctuating between 30% and 45% since at least 2014, suspected non-ischemic cardiomyopathy, and has not undergone heart catheterization but has had stress tests in the past. He has an ICD and a history of ventricular tachycardia status post VT ablation. Recently, he was advised of multiple episodes of VT with ATP therapy and was advised by device clinic to go to the ED. On further investigation, it was later determined to be inappropriate as the arrhythmias were actually felt to be atrial fibrillation. His device settings were adjusted, and he was continued on amiodarone .    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 He is followed by CT surgery for this condition and has a CT scan scheduled for October.  He has chronic kidney disease and is followed by nephrology at [facility]. After a recent visit to the heart failure clinic, his creatinine increased to 3.35. He was advised to hold Lasix  and potassium for two days and then restart at a reduced dose of 60 mg daily. Creatinine continued to rise and Spironolactone  was ultimately stopped with lasix  held. Follow up with nephrology arranged. Creatinine then improved and Lasix  ordered as needed only. Patient saw nephrology on 7/28. Labs checked prior to this visit  confirmed stable/improved renal function. Per patient's nephrologist, he was advised to take Lasix  60mg  twice daily. Today patient indicates he is only taking 40mg . Reports stable weights since last OV with me and denies symptoms concerning for CHF exacerbation including LE edema, orthopnea, exertional dyspnea.   Patient today says that the only change since his previous office visit is daily but intermittent dull chest pain, lasting less than five minutes. No exertional or positional component. No associated symptoms.   Today patient denies chest pain, shortness of breath, lower extremity edema, fatigue, palpitations, melena, hematuria, hemoptysis, diaphoresis, weakness, presyncope, syncope, orthopnea, and PND.   Studies Reviewed:    EKG:   EKG Interpretation Date/Time:  Wednesday October 31 2023 14:56:29 EDT Ventricular Rate:  62 PR Interval:    QRS Duration:  178 QT Interval:  488 QTC Calculation: 495 R Axis:   -61  Text Interpretation: Atrial fibrillation Left axis deviation Right bundle branch block Confirmed by Trudy Birmingham [phone]) on 10/31/2023 3:00:03 PM    07/03/23 TTE  IMPRESSIONS     1. Left ventricular ejection fraction, by estimation, is 35 to 40%. The  left ventricle has moderately decreased function. The left ventricle  demonstrates global hypokinesis. The left ventricular internal cavity size  was moderately to severely dilated.  There is mild eccentric left ventricular hypertrophy. Left ventricular  diastolic parameters are consistent with Grade I diastolic dysfunction  (impaired relaxation).   2. Right ventricular systolic function is normal. The right ventricular  size is normal. Tricuspid regurgitation signal is inadequate for assessing  PA  pressure.   3. Left atrial size was moderately dilated.   4. Right atrial size was mildly dilated.   5. The mitral valve is normal in structure. Moderate mitral valve  regurgitation.   6. The aortic valve is tricuspid.  Aortic valve regurgitation is moderate.   7. Aortic dilatation noted. There is severe dilatation of the aortic  root, measuring 50 mm. There is mild dilatation of the ascending aorta,  measuring 40 mm.   8. The inferior vena cava is normal in size with greater than 50%  respiratory variability, suggesting right atrial pressure of 3 mmHg.   Comparison(s): Prior images reviewed side by side. The left ventricular  function is unchanged. The aortic root is more dilated.   FINDINGS   Left Ventricle: Left ventricular ejection fraction, by estimation, is 35  to 40%. The left ventricle has moderately decreased function. The left  ventricle demonstrates global hypokinesis. The left ventricular internal  cavity size was moderately to  severely dilated. There is mild eccentric left ventricular hypertrophy.  Abnormal (paradoxical) septal motion, consistent with left bundle branch  block. Left ventricular diastolic parameters are consistent with Grade I  diastolic dysfunction (impaired  relaxation). Normal left ventricular filling pressure.   Right Ventricle: The right ventricular size is normal. No increase in  right ventricular wall thickness. Right ventricular systolic function is  normal. Tricuspid regurgitation signal is inadequate for assessing PA  pressure. The tricuspid regurgitant  velocity is 2.20 m/s, and with an assumed right atrial pressure of 3 mmHg,  the estimated right ventricular systolic pressure is 22.4 mmHg.   Left Atrium: Left atrial size was moderately dilated.   Right Atrium: Right atrial size was mildly dilated.   Pericardium: There is no evidence of pericardial effusion.   Mitral Valve: The mitral valve is normal in structure. Moderate mitral  valve regurgitation, with anteriorly-directed jet.   Tricuspid Valve: The tricuspid valve is normal in structure. Tricuspid  valve regurgitation is trivial.   Aortic Valve: The aortic valve is tricuspid. Aortic valve  regurgitation is  moderate. Aortic regurgitation PHT measures 538 msec.   Pulmonic Valve: The pulmonic valve was grossly normal. Pulmonic valve  regurgitation is trivial. No evidence of pulmonic stenosis.   Aorta: Aortic dilatation noted. There is severe dilatation of the aortic  root, measuring 50 mm. There is mild dilatation of the ascending aorta,  measuring 40 mm.   Venous: The inferior vena cava is normal in size with greater than 50%  respiratory variability, suggesting right atrial pressure of 3 mmHg.   IAS/Shunts: No atrial level shunt detected by color flow Doppler.   Additional Comments: A device lead is visualized in the right ventricle  and right atrium.   Risk Assessment/Calculations:    CHA2DS2-VASc Score = 4   This indicates a 4.8% annual risk of stroke. The patient's score is based upon: CHF History: 1 HTN History: 1 Diabetes History: 0 Stroke History: 0 Vascular Disease History: 1 Age Score: 1 Gender Score: 0             Physical Exam:   VS:  BP (!) 130/50   Pulse 62   Ht 6' 1 (1.854 m)   Wt (!) 300 lb 6.4 oz (136.3 kg)   SpO2 95%   BMI 39.63 kg/m    Wt Readings from Last 3 Encounters:  10/31/23 (!) 300 lb 6.4 oz (136.3 kg)  10/12/23 299 lb (135.6 kg)  10/04/23 296 lb 12.8 oz (134.6 kg)  Physical Exam Vitals reviewed.  Constitutional:      Appearance: Normal appearance.  HENT:     Head: Normocephalic.  Eyes:     Pupils: Pupils are equal, round, and reactive to light.  Cardiovascular:     Rate and Rhythm: Normal rate and regular rhythm.     Pulses: Normal pulses.     Heart sounds: Murmur heard.  Pulmonary:     Effort: Pulmonary effort is normal.     Breath sounds: Normal breath sounds.  Abdominal:     General: Abdomen is flat.     Palpations: Abdomen is soft.  Musculoskeletal:     Right lower leg: No edema.     Left lower leg: No edema.  Skin:    General: Skin is warm and dry.     Capillary Refill: Capillary refill takes less  than 2 seconds.  Neurological:     General: No focal deficit present.     Mental Status: He is alert and oriented to person, place, and time.  Psychiatric:        Mood and Affect: Mood normal.        Behavior: Behavior normal.        Thought Content: Thought content normal.        Judgment: Judgment normal.     ASSESSMENT AND PLAN:    Assessment & Plan Congestive heart failure with non-ischemic cardiomyopathy Chronic systolic heart failure with ejection fraction fluctuating between 30-45%. Recent echocardiogram in April 2025 showed EF of 35-40%, consistent with previous measurements. Recent episodes of inappropriate ATP therapy for atrial fibrillation, not VT. Currently on amiodarone  and Xarelto . No recent shocks or significant fluid retention. Weight is stable with minimal fluctuation. No significant peripheral edema. Improved renal function with adjusted Lasix  dosage by nephrologist. Most recent device Impedence indicates stable volume status. - Patient asked to confirm Lasix  dosage with nephrologist nurse to ensure correct dosing. - Now off Spironolactone  with no plans to resume with eGFR <30.  - Otherwise continue GDMT including Jardiance  10mg , Entresto  97-103mg  BID (nephrology aware patient taking), Bisoprolol  5mg   Atrial fibrillation Patient appears to be in atrial fibrillation by EKG today. Last month had episodes of atrial fibrillation misinterpreted as VT by ICD, leading to inappropriate ATP therapy. Device settings adjusted at that time, no further issues. No significant symptoms reported. - Continue Amiodarone  and Xarelto  (now dose adjusted to 15mg ). - Has EP follow up next month  Chest pain, unspecified Intermittent dull chest pain, lasting less than five minutes. Overall low suspicion of cardiac origin. Possible relation to Afib? - Order Lexiscan stress test given presence of aortic and coronary atherosclerosis on CT chest imaging.   Hyperlipidemia Last lipid panel from  08/21/23 shows total cholesterol 109mg /dL, LDL 57mg /dL.  - Continue Atorvastatin .  History of ventricular tachycardia, status post ablation Device interrogation stable as of 7/14.  - Follow up with electrophysiology team on August 6   Chronic kidney disease Recent acute kidney injury with recent creatinine increase to 3.35. Improved after Spironolactone  and Lasix  held. Nephrologist has since seen patient, adjusted Lasix  dosage to 60 mg twice a day. Patient today reports taking 40mg  BID.  - Patient needs to confirm Lasix  dosage with nephrologist's nurse to ensure correct dosing.  Severe dilation of aortic root Severe dilation of the aortic root measuring 5 cm, consistent with previous measurements. Under surveillance by CT surgery. No immediate surgical intervention planned, monitoring with imaging. - CT scan scheduled for October to monitor aortic root dilation  Moderate aortic insufficiency and mitral regurgitation Moderate aortic insufficiency and mitral regurgitation noted on recent echocardiogram. No significant change in symptoms or clinical status. - Continue monitoring symptoms and clinical status  Follow up with Dr. [name] in November.       Informed Consent   Shared Decision Making/Informed Consent The risks [chest pain, shortness of breath, cardiac arrhythmias, dizziness, blood pressure fluctuations, myocardial infarction, stroke/transient ischemic attack, nausea, vomiting, allergic reaction, radiation exposure, metallic taste sensation and life-threatening complications (estimated to be 1 in 10,000)], benefits (risk stratification, diagnosing coronary artery disease, treatment guidance) and alternatives of a nuclear stress test were discussed in detail with Mr. Corker and he agrees to proceed.      Signed, Artist Pouch, PA-C

## 2023-11-01 ENCOUNTER — Ambulatory Visit: Admitting: Adult Health

## 2023-11-01 ENCOUNTER — Encounter (HOSPITAL_COMMUNITY): Payer: Self-pay | Admitting: *Deleted

## 2023-11-01 ENCOUNTER — Encounter: Payer: Self-pay | Admitting: Adult Health

## 2023-11-01 VITALS — BP 113/72 | HR 73 | Temp 97.8°F | Ht 73.0 in | Wt 302.4 lb

## 2023-11-01 DIAGNOSIS — J430 Unilateral pulmonary emphysema [MacLeod's syndrome]: Secondary | ICD-10-CM | POA: Diagnosis not present

## 2023-11-01 DIAGNOSIS — I509 Heart failure, unspecified: Secondary | ICD-10-CM

## 2023-11-01 DIAGNOSIS — C3411 Malignant neoplasm of upper lobe, right bronchus or lung: Secondary | ICD-10-CM | POA: Diagnosis not present

## 2023-11-01 DIAGNOSIS — G4733 Obstructive sleep apnea (adult) (pediatric): Secondary | ICD-10-CM

## 2023-11-01 NOTE — Assessment & Plan Note (Addendum)
 Compensated on present regimen.  Continue Trelegy daily.  Plan  Patient Instructions  For continue on TRELEGY daily , rinse after use Activity as tolerated.  Albuterol  inhaler as needed  Follow up with Oncology as planned  Continue with surveillance CT scans.  Continue on CPAP at bedtime Do not drive if sleepy Work on [employer] .    Follow up with Dr. Shelah or Shavelle Runkel NP In 6 months and As needed

## 2023-11-01 NOTE — Progress Notes (Signed)
 @Patient  ID: Jason Russell, male    DOB: 12-08-1950, 73 y.o.   MRN: [id number]  Chief Complaint  Patient presents with   Sleep Apnea    No changes sleeps well    Referring provider: [name], MD  HPI: 73 year old male followed for severe COPD, severe obstructive sleep apnea, history of non-small cell lung cancer (stage IIIa non-small cell lung cancer, adenocarcinoma the right upper lobe in addition to mediastinal lymphadenopathy status post chemo, immunotherapy and XRT) Medical history significant for nonischemic heart disease, cardiomyopathy, congestive heart failure, A-fib status post ICD implant on amiodarone  and Xarelto    TEST/EVENTS :  07/31/16: FVC 1.84 L (41%) FEV1 1.14 L (42%) FEV1/FVC 0.77 FEF 25-75 1.17 L (38%) positive bronchodilator response TLC 5.76 L (75%) RV 140% ERV 48% DLCO uncorrected 52%     PFTs that show improved lung capacity with FEV1 at 57%, ratio 77, FVC 55%, DLCO 59%   IMAGING PET CT 07/31/16 Markedly hypermetabolic posterior right upper lobe pulmonary lesion consistent with malignancy and hypermetabolic mediastinal lymph node metastases.   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 This obscures the regional right upper lobe apical lung nodule. No obvious nodular growth or recurrent thoracic adenopathy.   PATHOLOGY FNA RUL 09/06/16:  Adenocarcinoma  EBUS 4R & 7 08/09/16:  Adenocarcinoma    CT chest October 18, 2020 shows stable posttreatment changes of radiation fibrosis in the right hemothorax with no evidence to suggest local recurrence of disease or definite metastatic disease in the thorax.  Very mild emphysema.   CT chest October 25, 2021 showed no new or progressive findings in that chest with evidence of posttreatment change in the right hilum and right paramediastinal lung.  11/01/2023 Follow up ; COPD, Lung cancer , OSA  Patient presents for 75-month follow-up.  Patient has underlying severe obstructive sleep  apnea.  Remains on CPAP at bedtime.  Feels that he is benefiting from CPAP.  CPAP download shows excellent compliance with 100% usage.  Daily average usage at 9.5 hours.  Patient is on auto CPAP 5 to 15 cm H2O.  Daily average pressure at 14.6 cm H2O.  AHI is 1.5/hour. Uses full face mask.   Patient has underlying COPD.  He remains on Trelegy daily.  Says overall breathing is doing okay.  No further cough and wheezing.  History of stage III non-small cell lung cancer.  He is followed by oncology.  Surveillance CT chest May 16, 2023 showed stable radiation changes.  No findings for metastatic disease or recurrent tumor.  Patient has upcoming repeat CT chest next month.  Was admitted in June for CHF and A Fib. Doing better. Decreased swelling.   No Known Allergies  Immunization History  Administered Date(s) Administered   Fluad Quad(high Dose 65+) 12/06/2021, 01/17/2023   Influenza Split 10/27/2016   Influenza, High Dose Seasonal PF 11/14/2016, 11/24/2018   Influenza,inj,Quad PF,6+ Mos 12/08/2015   Influenza,inj,quad, With Preservative 12/03/2019   Influenza-Unspecified 11/02/2014, 12/08/2015, 10/27/2016   PFIZER Comirnaty(Gray Top)Covid-19 Tri-Sucrose Vaccine 05/10/2019, 01/03/2020, 01/19/2023   PFIZER(Purple Top)SARS-COV-2 Vaccination 05/10/2019, 06/02/2019   PNEUMOCOCCAL CONJUGATE-20 12/25/2021   Pneumococcal Conjugate-13 11/14/2016   Pneumococcal Polysaccharide-23 10/16/2013   Pneumococcal-Unspecified 10/27/2016    Past Medical History:  Diagnosis Date   Adenocarcinoma of right lung, stage 3 (HCC) 08/23/2016   Anemia    Arthritis    hx right hip   Asthma    when I was a child   Atrial fibrillation (HCC)    Amiodarone  started 10/2011; Coumadin   Automatic implantable cardioverter-defibrillator in situ 10/03/2012   a. St. Jude ICD implantation 10/03/12.   Chronic anticoagulation    Chronic fatigue 10/18/2016   Chronic systolic heart failure (HCC)    a. Echo 7/13: EF 25%;   b. echo 04/2012:  Mild LVH, EF 30-35%, Gr 1 DD, mild AI, mild MR, mild LAE   COPD (chronic obstructive pulmonary disease) (HCC)    Dyslipidemia    Gout    History of blood transfusion 10/15/2013   don't know where the blood's going; HgB down to 5   Hyperlipidemia    Hypertension    Hypothyroidism    NICM (nonischemic cardiomyopathy) (HCC)    LHC 4/14:  minimal CAD   Obesity    OSA on CPAP    Thoracic ascending aortic aneurysm (HCC) 07/25/2023   CT 07/25/23: TAA 4.1 cm; 7 mm LUL nodule    Tobacco abuse     Tobacco History: Social History   Tobacco Use  Smoking Status Former   Current packs/day: 0.00   Average packs/day: 0.3 packs/day for 45.0 years (11.3 ttl pk-yrs)   Types: Cigarettes   Start date: 07/12/1971   Quit date: 07/11/2016   Years since quitting: 7.3  Smokeless Tobacco Never   Counseling given: Not Answered   Outpatient Medications Prior to Visit  Medication Sig Dispense Refill   albuterol  (VENTOLIN  HFA) 108 (90 Base) MCG/ACT inhaler Inhale 2 puffs into the lungs every 4 (four) hours as needed for wheezing or shortness of breath. 8.5 g 5   amiodarone  (PACERONE ) 200 MG tablet Take 1 tablet (200 mg total) by mouth daily. 90 tablet 0   atorvastatin  (LIPITOR) 40 MG tablet TAKE 1 TABLET BY MOUTH EVERY DAY 90 tablet 3   Bempedoic Acid-Ezetimibe (NEXLIZET ) 180-10 MG TABS Take 1 tablet by mouth daily in the afternoon. 30 tablet 11   bisoprolol  (ZEBETA ) 5 MG tablet TAKE 1 TABLET (5 MG TOTAL) BY MOUTH DAILY. 90 tablet 3   [employer] D3 2000 units CAPS Take 2,000 Units by mouth daily.   11   ferrous gluconate  (FERGON) 324 MG tablet TAKE 1 TABLET BY MOUTH THREE TIMES A DAY WITH MEALS 270 tablet 3   furosemide  (LASIX ) 20 MG tablet Take 3 tablets (60 mg in total) by mouth daily as needed for weight gain of 3-4 pounds in 24 hours. (Patient taking differently: Take 1.5 Tablets (60 mg Total) by mouth 2 (two) times a day) 90 tablet 0   JARDIANCE  10 MG TABS tablet Take 10 mg by mouth  daily.     levothyroxine  (SYNTHROID ) 50 MCG tablet TAKE 1 TABLET BY MOUTH EVERY DAY BEFORE BREAKFAST 90 tablet 2   magnesium  oxide (MAG-OX) 400 (241.3 Mg) MG tablet Take 1 tablet (400 mg total) by mouth daily. 30 tablet 0   Polyvinyl Alcohol -Povidone (REFRESH OP) Place 1 drop into both eyes every morning.     potassium chloride  SA (KLOR-CON  M) 20 MEQ tablet Take one tablet (20 MEQ) by mouth daily as needed when you administer lasix  as needed. (Patient taking differently: Take 40 mEq by mouth daily. Take one tablet (20 MEQ) by mouth daily as needed when you administer lasix  as needed.) 30 tablet 3   Rivaroxaban  (XARELTO ) 15 MG TABS tablet Take 1 tablet (15 mg total) by mouth daily with supper. 90 tablet 1   sacubitril -valsartan  (ENTRESTO ) 97-103 MG Take 1 tablet by mouth 2 (two) times daily. 180 tablet 3   sildenafil  (VIAGRA ) 100 MG tablet TAKE 1 TABLET BY MOUTH EVERY  DAY AS NEEDED FOR ERECTILE DYSFUNCTION 30 tablet 1   TRELEGY ELLIPTA  100-62.5-25 MCG/ACT AEPB INHALE 1 PUFF BY MOUTH EVERY DAY 180 each 3   ULORIC  40 MG tablet Take 1 tablet (40 mg total) by mouth daily. 30 tablet 0   No facility-administered medications prior to visit.     Review of Systems:   Constitutional:   No  weight loss, night sweats,  Fevers, chills, fatigue, or  lassitude.  HEENT:   No headaches,  Difficulty swallowing,  Tooth/dental problems, or  Sore throat,                No sneezing, itching, ear ache, nasal congestion, post nasal drip,   CV:  No chest pain,  Orthopnea, PND, +swelling in lower extremities, anasarca, dizziness, palpitations, syncope.   GI  No heartburn, indigestion, abdominal pain, nausea, vomiting, diarrhea, change in bowel habits, loss of appetite, bloody stools.   Resp: .  No wheezing.  No chest wall deformity  Skin: no rash or lesions.  GU: no dysuria, change in color of urine, no urgency or frequency.  No flank pain, no hematuria   MS:  No joint pain or swelling.  No decreased range of  motion.  No back pain.    Physical Exam  BP 113/72   Pulse 73   Temp 97.8 F (36.6 C) (Temporal)   Ht 6' 1 (1.854 m)   Wt (!) 302 lb 6.4 oz (137.2 kg)   SpO2 98%   BMI 39.90 kg/m   GEN: A/Ox3; pleasant , NAD, well nourished    HEENT:  [place]/AT,   NOSE-clear, THROAT-clear, no lesions, no postnasal drip or exudate noted.   NECK:  Supple w/ fair ROM; no JVD; normal carotid impulses w/o bruits; no thyromegaly or nodules palpated; no lymphadenopathy.    RESP  Clear  P & A; w/o, wheezes/ rales/ or rhonchi. no accessory muscle use, no dullness to percussion  CARD:  RRR, no m/r/g, no peripheral edema, pulses intact, no cyanosis or clubbing.  GI:   Soft & nt; nml bowel sounds; no organomegaly or masses detected.   Musco: Warm bil, no deformities or joint swelling noted.   Neuro: alert, no focal deficits noted.    Skin: Warm, no lesions or rashes    Lab Results:  CBC      ProBNP   Imaging:   Administration History     None          Latest Ref Rng & Units 05/07/2023    8:18 AM 07/31/2016    9:47 AM  PFT Results  FVC-Pre L 2.73  1.84   FVC-Predicted Pre % 55  41   FVC-Post L  2.32   FVC-Predicted Post %  52   Pre FEV1/FVC % % 77  77   Post FEV1/FCV % %  73   FEV1-Pre L 2.09  1.42   FEV1-Predicted Pre % 57  42   FEV1-Post L  1.69   DLCO uncorrected ml/min/mmHg 16.77  19.27   DLCO UNC% % 59  52   DLCO corrected ml/min/mmHg 16.77    DLCO COR %Predicted % 59    DLVA Predicted % 89  109   TLC L  5.76   TLC % Predicted %  75   RV % Predicted %  140     No results found for: NITRICOXIDE      Assessment & Plan:   OSA (obstructive sleep apnea) Excellent control and compliance on nocturnal CPAP.  Continue current settings  Plan  Patient Instructions  For continue on TRELEGY daily , rinse after use Activity as tolerated.  Albuterol  inhaler as needed  Follow up with Oncology as planned  Continue with surveillance CT scans.  Continue on CPAP at  bedtime Do not drive if sleepy Work on [employer] .    Follow up with Dr. Shelah or Ameliarose Shark NP In 6 months and As needed      COPD (chronic obstructive pulmonary disease) (HCC) Compensated on present regimen.  Continue Trelegy daily.  Plan  Patient Instructions  For continue on TRELEGY daily , rinse after use Activity as tolerated.  Albuterol  inhaler as needed  Follow up with Oncology as planned  Continue with surveillance CT scans.  Continue on CPAP at bedtime Do not drive if sleepy Work on [employer] .    Follow up with Dr. Shelah or Lakelyn Straus NP In 6 months and As needed      CHF (congestive heart failure) (HCC) Appears euvolemic on exam. Continue follow up with cardiology   Primary cancer of right upper lobe of lung (HCC) Hx of Stage IIIa NSCLC - s/p XRT, Chemo, Immotherapy 2018-2019  Continue with surveillance CT chest.  Upcoming CT chest for next month as planned with oncology.     Madelin Stank, NP 11/01/2023

## 2023-11-01 NOTE — Assessment & Plan Note (Signed)
Appears euvolemic on exam.  Continue follow-up with cardiology 

## 2023-11-01 NOTE — Patient Instructions (Addendum)
 For continue on TRELEGY daily , rinse after use Activity as tolerated.  Albuterol  inhaler as needed  Follow up with Oncology as planned  Continue with surveillance CT scans.  Continue on CPAP at bedtime Do not drive if sleepy Work on [employer] .    Follow up with Dr. Shelah or Skippy Marhefka NP In 6 months and As needed

## 2023-11-01 NOTE — Assessment & Plan Note (Addendum)
 Excellent control and compliance on nocturnal CPAP.  Continue current settings  Plan  Patient Instructions  For continue on TRELEGY daily , rinse after use Activity as tolerated.  Albuterol  inhaler as needed  Follow up with Oncology as planned  Continue with surveillance CT scans.  Continue on CPAP at bedtime Do not drive if sleepy Work on [employer] .    Follow up with Dr. Shelah or Cleon Thoma NP In 6 months and As needed

## 2023-11-01 NOTE — Assessment & Plan Note (Signed)
 Hx of Stage IIIa NSCLC - s/p XRT, Chemo, Immotherapy 2018-2019  Continue with surveillance CT chest.  Upcoming CT chest for next month as planned with oncology.

## 2023-11-05 ENCOUNTER — Ambulatory Visit: Attending: Internal Medicine

## 2023-11-05 DIAGNOSIS — I5022 Chronic systolic (congestive) heart failure: Secondary | ICD-10-CM

## 2023-11-05 DIAGNOSIS — Z95810 Presence of automatic (implantable) cardiac defibrillator: Secondary | ICD-10-CM

## 2023-11-05 NOTE — Progress Notes (Signed)
 EPIC Encounter for ICM Monitoring  Patient Name: Jason Russell is a 73 y.o. male Date: 11/05/2023 Primary Care Physican: [name], MD Primary Cardiologist: [name] Electrophysiologist: [name] 09/13/2022 Weight: 280 lbs 05/21/2022 Weight: 288 lbs 06/25/2023 Weight: 288 lbs 09/05/2023 Weight: 290 lbs 10/30/2023 Weight: 286 lbs (Highest weight last week was 290 lbs). 10/31/2023 Office Weight: 300 lbs   Spoke with patient and heart failure questions reviewed.  Transmission results reviewed.  Pt reports he is feeling well and denies any fluid symptoms.  Completed cardiologist visit with Artist Pouch, PA and followed up with kidney doctor regarding correct Lasix  dosage which is 40 bid.   He did eat out at seafood restaurant on 8/2 which may contribute to the possible fluid return.     Diet:  Cooks at home most of the time and occasionally eats out a meal.     CorVue Thoracic impedance suggesting possible fluid accumulation from accumulation returned 7/10-7/27 returned to normal 7/28 but taking another downward trend 8/2 suggesting fluid may be returning.   Prescribed:  Furosemide  20 mg take 3 tablet(s) (60 mg total) by mouth daily as needed for weight gain of 3-4 pounds within 24 hours.  8/4 Confirmed with kidney physician he is taking 40 mg bid  Potassium 20 mEq 1 tablet by mouth daily as needed when you take Lasix  as needed.   7/29 Pt reports increased to 40 mEq by mouth daily by Nephrologist at 7/28 OV.   Labs: 10/26/2023 Creatinine 2.50, BUN 39, Potassium 3.4, Sodium 142, GFR 27  10/18/2023 Creatinine 2.46, BUN 51, Potassium 4.1, Sodium 142, GFR 27  10/12/2023 Creatinine 3.34, BUN 60, Potassium 5.2, Sodium 142, GFR 19  10/04/2023 Creatinine 3.35, BUN 63, Potassium 3.8, Sodium 140 09/28/2023 Creatinine 2.47, BUN 29, Potassium 3.7, Sodium 142  09/27/2023 Creatinine 2.08, BUN 21, Potassium 3.2, Sodium 140  09/26/2023 Creatinine 2.01, BUN 19, Potassium 4.1, Sodium 142  A complete set of results  can be found in results review   Recommendations:  Advised to call Nephrologist office for Lasix  dosing if he experiences any fluid symptoms and he agreed to do so.  Encouraged to limit salt intake.    Follow-up plan: ICM clinic phone appointment on 11/12/2023 to recheck fluid levels.   91 day device clinic remote transmission 01/14/2024.     EP/Cardiology Office Visits:     Recall 12/05/2023 with Dr [name].  11/07/2023 with Dr [name].    Copy of ICM check sent to Dr. [name].   3 month ICM trend: 11/05/2023.    12-14 Month ICM trend:     [name], RN 11/05/2023 2:52 PM

## 2023-11-07 ENCOUNTER — Ambulatory Visit: Attending: Internal Medicine | Admitting: Internal Medicine

## 2023-11-07 ENCOUNTER — Encounter: Payer: Self-pay | Admitting: Internal Medicine

## 2023-11-07 VITALS — BP 114/62 | HR 79 | Ht 73.0 in | Wt 296.0 lb

## 2023-11-07 DIAGNOSIS — I472 Ventricular tachycardia, unspecified: Secondary | ICD-10-CM | POA: Diagnosis not present

## 2023-11-07 LAB — CUP PACEART INCLINIC DEVICE CHECK
Battery Remaining Longevity: 55 mo
Brady Statistic RV Percent Paced: 0.5 %
Date Time Interrogation Session: 20250806122054
HighPow Impedance: 41.6074
Implantable Lead Connection Status: 753985
Implantable Lead Implant Date: 20140703
Implantable Lead Location: 753860
Implantable Pulse Generator Implant Date: 20191113
Lead Channel Impedance Value: 462.5 Ohm
Lead Channel Pacing Threshold Amplitude: 0.5 V
Lead Channel Pacing Threshold Amplitude: 0.5 V
Lead Channel Pacing Threshold Pulse Width: 0.5 ms
Lead Channel Pacing Threshold Pulse Width: 0.5 ms
Lead Channel Sensing Intrinsic Amplitude: 4.8 mV
Lead Channel Setting Pacing Amplitude: 2.5 V
Lead Channel Setting Pacing Pulse Width: 0.5 ms
Lead Channel Setting Sensing Sensitivity: 0.5 mV
Pulse Gen Serial Number: 9820959
Zone Setting Status: 755011

## 2023-11-07 MED ORDER — SILDENAFIL CITRATE 100 MG PO TABS: 100.0000 mg | ORAL_TABLET | ORAL | 2 refills | Status: DC | PRN

## 2023-11-07 MED ORDER — ATORVASTATIN CALCIUM 10 MG PO TABS: 10.0000 mg | ORAL_TABLET | Freq: Every day | ORAL | 3 refills | Status: DC

## 2023-11-07 NOTE — Progress Notes (Signed)
 HPI Jason Russell returns today for followup. He has a nonischemic CM, EF 35% and VT and underwent EP study and was found to have no inducible MMVT. 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 He has gone back to fishing several times a week. He denies chest pain. He has class 2 dyspnea. He has not been able to lose weight.  No Known Allergies   Current Outpatient Medications  Medication Sig Dispense Refill   albuterol  (VENTOLIN  HFA) 108 (90 Base) MCG/ACT inhaler Inhale 2 puffs into the lungs every 4 (four) hours as needed for wheezing or shortness of breath. 8.5 g 5   amiodarone  (PACERONE ) 200 MG tablet Take 1 tablet (200 mg total) by mouth daily. 90 tablet 0   atorvastatin  (LIPITOR) 40 MG tablet TAKE 1 TABLET BY MOUTH EVERY DAY 90 tablet 3   Bempedoic Acid-Ezetimibe (NEXLIZET ) 180-10 MG TABS Take 1 tablet by mouth daily in the afternoon. 30 tablet 11   bisoprolol  (ZEBETA ) 5 MG tablet TAKE 1 TABLET (5 MG TOTAL) BY MOUTH DAILY. 90 tablet 3   [employer] D3 2000 units CAPS Take 2,000 Units by mouth daily.   11   ferrous gluconate  (FERGON) 324 MG tablet TAKE 1 TABLET BY MOUTH THREE TIMES A DAY WITH MEALS 270 tablet 3   furosemide  (LASIX ) 20 MG tablet Take 3 tablets (60 mg in total) by mouth daily as needed for weight gain of 3-4 pounds in 24 hours. (Patient taking differently: Take 1.5 Tablets (60 mg Total) by mouth 2 (two) times a day) 90 tablet 0   JARDIANCE  10 MG TABS tablet Take 10 mg by mouth daily.     levothyroxine  (SYNTHROID ) 50 MCG tablet TAKE 1 TABLET BY MOUTH EVERY DAY BEFORE BREAKFAST 90 tablet 2   magnesium  oxide (MAG-OX) 400 (241.3 Mg) MG tablet Take 1 tablet (400 mg total) by mouth daily. 30 tablet 0   Polyvinyl Alcohol -Povidone (REFRESH OP) Place 1 drop into both eyes every morning.     potassium chloride  SA (KLOR-CON  M) 20 MEQ tablet Take one tablet (20 MEQ) by mouth daily as needed when you administer lasix  as needed. (Patient taking differently:  Take 40 mEq by mouth daily. Take one tablet (20 MEQ) by mouth daily as needed when you administer lasix  as needed.) 30 tablet 3   Rivaroxaban  (XARELTO ) 15 MG TABS tablet Take 1 tablet (15 mg total) by mouth daily with supper. 90 tablet 1   sacubitril -valsartan  (ENTRESTO ) 97-103 MG Take 1 tablet by mouth 2 (two) times daily. 180 tablet 3   sildenafil  (VIAGRA ) 100 MG tablet TAKE 1 TABLET BY MOUTH EVERY DAY AS NEEDED FOR ERECTILE DYSFUNCTION 30 tablet 1   TRELEGY ELLIPTA  100-62.5-25 MCG/ACT AEPB INHALE 1 PUFF BY MOUTH EVERY DAY 180 each 3   ULORIC  40 MG tablet Take 1 tablet (40 mg total) by mouth daily. 30 tablet 0   No current facility-administered medications for this visit.     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 St. Jude ICD implantation 10/03/12.   Chronic anticoagulation    Chronic fatigue 10/18/2016   Chronic systolic heart failure (HCC)    a.  Echo 7/13: EF 25%;  b. echo 04/2012:  Mild LVH, EF 30-35%, Gr 1 DD, mild AI, mild MR, mild LAE   COPD (chronic obstructive pulmonary disease) (HCC)    Dyslipidemia    Gout    History of blood transfusion 10/15/2013   don't know where the blood's going; HgB down to 5   Hyperlipidemia    Hypertension    Hypothyroidism    NICM (nonischemic cardiomyopathy) (HCC)    LHC 4/14:  minimal CAD   Obesity    OSA on CPAP    Thoracic ascending aortic aneurysm (HCC) 07/25/2023   CT 07/25/23: TAA 4.1 cm; 7 mm LUL nodule    Tobacco abuse     ROS:   All systems reviewed and negative except as noted in the HPI.   Past Surgical History:  Procedure Laterality Date   CARDIAC DEFIBRILLATOR PLACEMENT  2014   CARDIOVERSION  2011   CARDIOVERSION N/A 03/15/2020   Procedure:  CARDIOVERSION;  Surgeon: [name] Vina GAILS, MD;  Location: [facility] ENDOSCOPY;  Service: Cardiovascular;  Laterality: N/A;   COLONOSCOPY WITH PROPOFOL  Left 10/17/2013   Procedure: COLONOSCOPY WITH PROPOFOL ;  Surgeon: Lamar JONETTA Aho, MD;  Location: [facility] ENDOSCOPY;  Service: Endoscopy;  Laterality: Left;   ESOPHAGOGASTRODUODENOSCOPY N/A 10/17/2013   Procedure: ESOPHAGOGASTRODUODENOSCOPY (EGD);  Surgeon: Lamar JONETTA Aho, MD;  Location: [facility] ENDOSCOPY;  Service: Endoscopy;  Laterality: N/A;   ESOPHAGOGASTRODUODENOSCOPY (EGD) WITH PROPOFOL  N/A 06/14/2018   Procedure: ESOPHAGOGASTRODUODENOSCOPY (EGD) WITH PROPOFOL ;  Surgeon: Legrand Victory LITTIE DOUGLAS, MD;  Location: [facility] ENDOSCOPY;  Service: Gastroenterology;  Laterality: N/A;   GIVENS CAPSULE STUDY N/A 10/29/2013   Procedure: GIVENS CAPSULE STUDY;  Surgeon: Lamar JONETTA Aho, MD;  Location: [facility] ENDOSCOPY;  Service: Endoscopy;  Laterality: N/A;   ICD GENERATOR CHANGEOUT N/A 02/13/2018   Procedure: ICD GENERATOR CHANGEOUT;  Surgeon: Inocencio Soyla Lunger, MD;  Location[facility] INVASIVE CV LAB;  Service: Cardiovascular;  Laterality: N/A;   IMPLANTABLE CARDIOVERTER DEFIBRILLATOR IMPLANT Left 10/03/2012   Procedure: IMPLANTABLE CARDIOVERTER DEFIBRILLATOR IMPLANT;  Surgeon: Elspeth JAYSON Sage, MD;  Location: [facility] CATH LAB;  Service: Cardiovascular;  Laterality: Left;   IR FLUORO GUIDE PORT INSERTION RIGHT  09/21/2016   IR US  GUIDE VASC ACCESS RIGHT  09/21/2016   JOINT REPLACEMENT     REVERSE SHOULDER ARTHROPLASTY Right 09/22/2020   Procedure: SHOULDER HEMI ARTHROPLASTY;  Surgeon: Cristy Bonner DASEN, MD;  Location: [facility] ORS;  Service: Orthopedics;  Laterality: Right;   SAVORY DILATION N/A 06/14/2018   Procedure: SAVORY DILATION;  Surgeon: Legrand Victory LITTIE DOUGLAS, MD;  Location: [facility] ENDOSCOPY;  Service: Gastroenterology;  Laterality: N/A;   TONSILLECTOMY  1950's   TOTAL HIP ARTHROPLASTY Right 11/25/1997   V TACH ABLATION N/A 09/15/2021   Procedure: V TACH ABLATION;  Surgeon: [name] Danelle ORN, MD;  Location: [facility]  INVASIVE CV LAB;  Service: Cardiovascular;  Laterality: N/A;   VIDEO BRONCHOSCOPY WITH ENDOBRONCHIAL ULTRASOUND N/A 08/09/2016   Procedure: VIDEO BRONCHOSCOPY WITH ENDOBRONCHIAL ULTRASOUND;  Surgeon: Noreen Tonnie BRAVO, MD;  Location: [facility] OR;  Service: Thoracic;  Laterality: N/A;     Family History  Problem Relation Age of Onset   Hypertension Mother    Heart Problems Mother    Other Father        deceased- unknow reason   Lung cancer Brother      Social History   Socioeconomic History   Marital status: Widowed    Spouse name: Not on file   Number of children: 1   Years of education: Not  on file   Highest education level: High school graduate  Occupational History   Occupation: retired    Associate Professor: [employer]  Tobacco Use   Smoking status: Former    Current packs/day: 0.00    Average packs/day: 0.3 packs/day for 45.0 years (11.3 ttl pk-yrs)    Types: Cigarettes    Start date: 07/12/1971    Quit date: 07/11/2016    Years since quitting: 7.3   Smokeless tobacco: Never  Vaping Use   Vaping status: Never Used  Substance and Sexual Activity   Alcohol  use: Yes    Alcohol /week: 1.0 standard drink of alcohol     Types: 1 Cans of beer per week    Comment: seldom   Drug use: Never   Sexual activity: Not Currently  Other Topics Concern   Not on file  Social History Narrative   Not on file   Social Drivers of Health   Financial Resource Strain: Low Risk  (09/27/2023)   Overall Financial Resource Strain (CARDIA)    Difficulty of Paying Living Expenses: Not hard at all  Food Insecurity: No Food Insecurity (09/27/2023)   Hunger Vital Sign    Worried About Running Out of Food in the Last Year: Never true    Ran Out of Food in the Last Year: Never true  Transportation Needs: No Transportation Needs (09/27/2023)   PRAPARE - Administrator, Civil Service (Medical): No    Lack of Transportation (Non-Medical): No  Physical Activity: Not on file  Stress: No Stress  Concern Present (09/26/2021)   [employer] of Occupational Health - Occupational Stress Questionnaire    Feeling of Stress : Not at all  Social Connections: Moderately Integrated (09/27/2023)   Social Connection and Isolation Panel    Frequency of Communication with Friends and Family: More than three times a week    Frequency of Social Gatherings with Friends and Family: Three times a week    Attends Religious Services: More than 4 times per year    Active Member of Clubs or Organizations: No    Attends Banker Meetings: More than 4 times per year    Marital Status: Widowed  Intimate Partner Violence: Not At Risk (09/27/2023)   Humiliation, Afraid, Rape, and Kick questionnaire    Fear of Current or Ex-Partner: No    Emotionally Abused: No    Physically Abused: No    Sexually Abused: No     BP 114/62   Pulse 79   Ht 6' 1 (1.854 m)   Wt 296 lb (134.3 kg)   SpO2 96%   BMI 39.05 kg/m   Physical Exam:  obese appearing NAD HEENT: Unremarkable Neck:  No JVD, no thyromegally Lymphatics:  No adenopathy Back:  No CVA tenderness Lungs:  Clear with no wheezes HEART:  Regular rate rhythm, no murmurs, no rubs, no clicks Abd:  soft, positive bowel sounds, no organomegally, no rebound, no guarding Ext:  2 plus pulses, no edema, no cyanosis, no clubbing Skin:  No rashes no nodules Neuro:  CN II through XII intact, motor grossly intact   DEVICE  Normal device function.  See PaceArt for details.   Assess/Plan:  VT - he has maintained NSR with no additional VT. He will continue 200 mg daily. I would decreased dose down to 6 days a week when he returns next year if he has not had VT or afib.  Chronic systolic heart failure - he has class 2 symptoms. Continue  current meds. Obesity -he is encouraged to lose weight.  ICD - his St. Jude single chamber ICD is working normally. We will recheck in several months. Dyslipidemia - I asked him to decrease his dose of lipitor to  10 mg daily.    Danelle Elianah Karis,MD

## 2023-11-07 NOTE — Addendum Note (Signed)
 Addended by: Sherol Sabas N on: 11/07/2023 10:44 AM   Modules accepted: Orders

## 2023-11-07 NOTE — Patient Instructions (Addendum)
 Medication Instructions:  Your physician has recommended you make the following change in your medication:  Decrease lipitor to 10mg  daily.  Lab Work: None ordered.  You may go to any [facility] Location for your lab work:  [facility] - [address] ([facility]) - [address] [phone] [address]  [place] - [address]   [place]  - [address]   [place] - [address] - [address] [employer]  - [address] - [address] ([facility]   If you have labs (blood work) drawn today and your tests are completely normal, you will receive your results only by: [employer] (if you have MyChart)  If you have any lab test that is abnormal or we need to change your treatment, we will call you or send a MyChart message to review the results.  Testing/Procedures: None ordered.  Follow-Up: At [facility], you and your health needs are our priority.  As part of our continuing mission to provide you with exceptional heart care, we have created designated Provider Care Teams.  These Care Teams include your primary Cardiologist (physician) and Advanced Practice Providers (APPs -  Physician Assistants and Nurse Practitioners) who all work together to provide you with the care you need, when you need it.   Your next appointment:   1 year(s)  The format for your next appointment:   In Person  Provider:   Donnice Primus, MD or one of the following Advanced Practice Providers on your designated Care Team:   Charlies Arthur, [place] Ozell Jodie Passey, [place] Leotis Barrack, NP  Note: Remote monitoring is used to monitor your Pacemaker/ ICD from home. This monitoring reduces the number of office visits required to check your device to one time per year. It allows us  to keep an eye on the functioning of your device to ensure it is working properly.

## 2023-11-08 ENCOUNTER — Ambulatory Visit: Payer: Medicare Other | Admitting: Adult Health

## 2023-11-10 ENCOUNTER — Other Ambulatory Visit: Payer: Self-pay | Admitting: Adult Health

## 2023-11-12 ENCOUNTER — Inpatient Hospital Stay: Payer: Medicare Other | Attending: Internal Medicine

## 2023-11-12 ENCOUNTER — Other Ambulatory Visit: Payer: Self-pay | Admitting: Cardiology

## 2023-11-12 ENCOUNTER — Ambulatory Visit (HOSPITAL_COMMUNITY)
Admission: RE | Admit: 2023-11-12 | Discharge: 2023-11-12 | Disposition: A | Discharge status: Home / Self Care | Source: Ambulatory Visit | Attending: Internal Medicine | Admitting: Internal Medicine

## 2023-11-12 ENCOUNTER — Ambulatory Visit (INDEPENDENT_AMBULATORY_CARE_PROVIDER_SITE_OTHER)

## 2023-11-12 DIAGNOSIS — J984 Other disorders of lung: Secondary | ICD-10-CM | POA: Diagnosis not present

## 2023-11-12 DIAGNOSIS — I5022 Chronic systolic (congestive) heart failure: Secondary | ICD-10-CM

## 2023-11-12 DIAGNOSIS — I7121 Aneurysm of the ascending aorta, without rupture: Secondary | ICD-10-CM | POA: Diagnosis not present

## 2023-11-12 DIAGNOSIS — N289 Disorder of kidney and ureter, unspecified: Secondary | ICD-10-CM | POA: Diagnosis not present

## 2023-11-12 DIAGNOSIS — I77819 Aortic ectasia, unspecified site: Secondary | ICD-10-CM

## 2023-11-12 DIAGNOSIS — C3412 Malignant neoplasm of upper lobe, left bronchus or lung: Secondary | ICD-10-CM | POA: Insufficient documentation

## 2023-11-12 DIAGNOSIS — Z9221 Personal history of antineoplastic chemotherapy: Secondary | ICD-10-CM | POA: Diagnosis not present

## 2023-11-12 DIAGNOSIS — C3490 Malignant neoplasm of unspecified part of unspecified bronchus or lung: Secondary | ICD-10-CM

## 2023-11-12 DIAGNOSIS — Z95810 Presence of automatic (implantable) cardiac defibrillator: Secondary | ICD-10-CM

## 2023-11-12 DIAGNOSIS — R911 Solitary pulmonary nodule: Secondary | ICD-10-CM

## 2023-11-12 DIAGNOSIS — I429 Cardiomyopathy, unspecified: Secondary | ICD-10-CM

## 2023-11-12 DIAGNOSIS — Z79899 Other long term (current) drug therapy: Secondary | ICD-10-CM

## 2023-11-12 DIAGNOSIS — Z7901 Long term (current) use of anticoagulants: Secondary | ICD-10-CM

## 2023-11-12 DIAGNOSIS — Z923 Personal history of irradiation: Secondary | ICD-10-CM | POA: Insufficient documentation

## 2023-11-12 LAB — CMP (CANCER CENTER ONLY)
ALT: 12 U/L (ref 0–44)
AST: 15 U/L (ref 15–41)
Albumin: 4 g/dL (ref 3.5–5.0)
Alkaline Phosphatase: 84 U/L (ref 38–126)
Anion gap: 7 (ref 5–15)
BUN: 57 mg/dL — ABNORMAL HIGH (ref 8–23)
CO2: 32 mmol/L (ref 22–32)
Calcium: 9.6 mg/dL (ref 8.9–10.3)
Chloride: 105 mmol/L (ref 98–111)
Creatinine: 3.22 mg/dL — ABNORMAL HIGH (ref 0.61–1.24)
GFR, Estimated: 20 mL/min — ABNORMAL LOW (ref >60)
Glucose, Bld: 99 mg/dL (ref 70–99)
Potassium: 3.9 mmol/L (ref 3.5–5.1)
Sodium: 144 mmol/L (ref 135–145)
Total Bilirubin: 0.5 mg/dL (ref 0.0–1.2)
Total Protein: 7 g/dL (ref 6.5–8.1)

## 2023-11-12 LAB — CBC WITH DIFFERENTIAL (CANCER CENTER ONLY)
Abs Immature Granulocytes: 0.02 K/uL (ref 0.00–0.07)
Basophils Absolute: 0 K/uL (ref 0.0–0.1)
Basophils Relative: 0 %
Eosinophils Absolute: 0.1 K/uL (ref 0.0–0.5)
Eosinophils Relative: 2 %
HCT: 35.9 % — ABNORMAL LOW (ref 39.0–52.0)
Hemoglobin: 11.4 g/dL — ABNORMAL LOW (ref 13.0–17.0)
Immature Granulocytes: 0 %
Lymphocytes Relative: 13 %
Lymphs Abs: 0.6 K/uL — ABNORMAL LOW (ref 0.7–4.0)
MCH: 29.5 pg (ref 26.0–34.0)
MCHC: 31.8 g/dL (ref 30.0–36.0)
MCV: 93 fL (ref 80.0–100.0)
Monocytes Absolute: 0.8 K/uL (ref 0.1–1.0)
Monocytes Relative: 17 %
Neutro Abs: 3.1 K/uL (ref 1.7–7.7)
Neutrophils Relative %: 68 %
Platelet Count: 178 K/uL (ref 150–400)
RBC: 3.86 MIL/uL — ABNORMAL LOW (ref 4.22–5.81)
RDW: 14 % (ref 11.5–15.5)
WBC Count: 4.7 K/uL (ref 4.0–10.5)
nRBC: 0 % (ref 0.0–0.2)

## 2023-11-12 NOTE — Progress Notes (Signed)
 EPIC Encounter for ICM Monitoring  Patient Name: Jason Russell is a 73 y.o. male Date: 11/12/2023 Primary Care Physican: [name], MD Primary Cardiologist: [name] Electrophysiologist: [name] 09/13/2022 Weight: 280 lbs 05/21/2022 Weight: 288 lbs 06/25/2023 Weight: 288 lbs 09/05/2023 Weight: 290 lbs 10/30/2023 Weight: 286 lbs (Highest weight last week was 290 lbs). 10/31/2023 Office Weight: 300 lbs 11/12/2023 Weight: 292   Spoke with patient and heart failure questions reviewed.  Transmission results reviewed.  Pt reports about 3 lbs wt loss and feeling fine     Diet:  Cooks at home most of the time and occasionally eats out a meal.     CorVue Thoracic impedance suggesting fluid levels returned to normal 8/5.   Prescribed:  Furosemide  20 mg take 3 tablet(s) (60 mg total) by mouth daily as needed for weight gain of 3-4 pounds within 24 hours.  8/4 Pt confirmed with kidney physician his dosage should be 40 mg bid  Potassium 20 mEq 1 tablet by mouth daily as needed when you take Lasix  as needed.   7/29 Pt reports increased to 40 mEq by mouth daily by Nephrologist at 7/28 OV.   Labs: 10/26/2023 Creatinine 2.50, BUN 39, Potassium 3.4, Sodium 142, GFR 27  10/18/2023 Creatinine 2.46, BUN 51, Potassium 4.1, Sodium 142, GFR 27  10/12/2023 Creatinine 3.34, BUN 60, Potassium 5.2, Sodium 142, GFR 19  10/04/2023 Creatinine 3.35, BUN 63, Potassium 3.8, Sodium 140 09/28/2023 Creatinine 2.47, BUN 29, Potassium 3.7, Sodium 142  09/27/2023 Creatinine 2.08, BUN 21, Potassium 3.2, Sodium 140  09/26/2023 Creatinine 2.01, BUN 19, Potassium 4.1, Sodium 142  A complete set of results can be found in results review   Recommendations:  No changes and encouraged to call if experiencing any fluid symptoms.   Follow-up plan: ICM clinic phone appointment on 12/04/2023.   91 day device clinic remote transmission 01/14/2024.     EP/Cardiology Office Visits:     Recall 12/05/2023 with Dr [name].  11/07/2023 with Dr [name].     Copy of ICM check sent to Dr. [name].  3 month ICM trend: 11/12/2023.    12-14 Month ICM trend:     [name], RN 11/12/2023 1:33 PM

## 2023-11-15 ENCOUNTER — Ambulatory Visit: Payer: Self-pay | Admitting: Cardiology

## 2023-11-15 ENCOUNTER — Ambulatory Visit (HOSPITAL_COMMUNITY)
Admission: RE | Admit: 2023-11-15 | Discharge: 2023-11-15 | Disposition: A | Discharge status: Home / Self Care | Source: Ambulatory Visit | Attending: Cardiology | Admitting: Cardiology

## 2023-11-15 DIAGNOSIS — Z95810 Presence of automatic (implantable) cardiac defibrillator: Secondary | ICD-10-CM | POA: Diagnosis not present

## 2023-11-15 DIAGNOSIS — Z79899 Other long term (current) drug therapy: Secondary | ICD-10-CM | POA: Diagnosis not present

## 2023-11-15 DIAGNOSIS — Z7901 Long term (current) use of anticoagulants: Secondary | ICD-10-CM | POA: Diagnosis not present

## 2023-11-15 DIAGNOSIS — I77819 Aortic ectasia, unspecified site: Secondary | ICD-10-CM | POA: Insufficient documentation

## 2023-11-15 DIAGNOSIS — I429 Cardiomyopathy, unspecified: Secondary | ICD-10-CM | POA: Insufficient documentation

## 2023-11-15 DIAGNOSIS — I7121 Aneurysm of the ascending aorta, without rupture: Secondary | ICD-10-CM | POA: Insufficient documentation

## 2023-11-15 DIAGNOSIS — I5022 Chronic systolic (congestive) heart failure: Secondary | ICD-10-CM | POA: Insufficient documentation

## 2023-11-15 DIAGNOSIS — R911 Solitary pulmonary nodule: Secondary | ICD-10-CM | POA: Insufficient documentation

## 2023-11-15 LAB — MYOCARDIAL PERFUSION IMAGING
LV dias vol: 246 mL (ref 62–150)
LV sys vol: 147 mL (ref 4.2–5.8)
Nuc Stress EF: 40 %
Peak HR: 72 {beats}/min
Rest HR: 54 {beats}/min
Rest Nuclear Isotope Dose: 13 mCi
SDS: 3
SRS: 1
SSS: 4
ST Depression (mm): 0 mm
Stress Nuclear Isotope Dose: 37.2 mCi
TID: 1.03

## 2023-11-15 MED ORDER — REGADENOSON 0.4 MG/5ML IV SOLN
INTRAVENOUS | Status: AC
  Filled 2023-11-15: qty 5

## 2023-11-15 MED ORDER — TECHNETIUM TC 99M TETROFOSMIN IV KIT
13.0000 | PACK | Freq: Once | INTRAVENOUS | Status: AC | PRN
  Administered 2023-11-15: 13 via INTRAVENOUS

## 2023-11-15 MED ORDER — REGADENOSON 0.4 MG/5ML IV SOLN
0.4000 mg | Freq: Once | INTRAVENOUS | Status: AC
  Administered 2023-11-15: 0.4 mg via INTRAVENOUS

## 2023-11-15 MED ORDER — TECHNETIUM TC 99M TETROFOSMIN IV KIT
37.2000 | PACK | Freq: Once | INTRAVENOUS | Status: AC | PRN
  Administered 2023-11-15: 37.2 via INTRAVENOUS

## 2023-11-19 ENCOUNTER — Inpatient Hospital Stay (HOSPITAL_BASED_OUTPATIENT_CLINIC_OR_DEPARTMENT_OTHER): Payer: Medicare Other | Admitting: Internal Medicine

## 2023-11-19 VITALS — BP 124/44 | HR 88 | Temp 97.3°F | Resp 17 | Ht 73.0 in | Wt 301.0 lb

## 2023-11-19 DIAGNOSIS — Z923 Personal history of irradiation: Secondary | ICD-10-CM | POA: Diagnosis not present

## 2023-11-19 DIAGNOSIS — Z9221 Personal history of antineoplastic chemotherapy: Secondary | ICD-10-CM | POA: Diagnosis not present

## 2023-11-19 DIAGNOSIS — N289 Disorder of kidney and ureter, unspecified: Secondary | ICD-10-CM | POA: Diagnosis not present

## 2023-11-19 DIAGNOSIS — C3490 Malignant neoplasm of unspecified part of unspecified bronchus or lung: Secondary | ICD-10-CM

## 2023-11-19 DIAGNOSIS — C3412 Malignant neoplasm of upper lobe, left bronchus or lung: Secondary | ICD-10-CM | POA: Diagnosis not present

## 2023-11-19 NOTE — Progress Notes (Signed)
 [facility] [phone]   [phone]  OFFICE PROGRESS NOTE  [name], MD [address] [place] [address]  DIAGNOSIS: Stage IIIA (T1a, N2, M0) non-small cell lung cancer, adenocarcinoma presented with right upper lobe lung nodule in addition to mediastinal lymphadenopathy diagnosed in March 2018.  Biomarker Findings Tumor Mutational Burden - TMB-Intermediate (8 Muts/Mb) Microsatellite Status - MS-Stable Genomic Findings For a complete list of the genes assayed, please refer to the Appendix. ERBB2 amplification - equivocal? DNMT3A K459fs*192 FUBP1 Q40* KEAP1 G332fs*68 TP53 C275F 7 Disease relevant genes with no reportable alterations: EGFR, KRAS, ALK, BRAF, MET, RET, ROS1   PRIOR THERAPY:  1) Course of concurrent chemoradiation with weekly carboplatin  for AUC of 2 and paclitaxel  45 MG/M2. Status post 6 cycles. Last cycle was given 10/16/2016. 2)  Consolidation treatment with immunotherapy with Imfinzi  (Durvalumab ) 10 MG/M2 every 2 weeks. First dose 12/21/2016.  Status post 26 cycles.  CURRENT THERAPY: Observation.  INTERVAL HISTORY: Jason Russell 73 y.o. male returns to the clinic today for 51-month follow-up visit.Discussed the use of AI scribe software for clinical note transcription with the patient, who gave verbal consent to proceed.  History of Present Illness Jason Russell is a 73 year old male with stage IIIA non-small cell lung cancer who presents for evaluation and discussion of recent imaging studies.  He was diagnosed with stage IIIA non-small cell lung cancer, adenocarcinoma, in March 2018. He underwent concurrent chemoradiation with weekly carboplatin  and paclitaxel , followed by one year of consolidation treatment with Imfinzi , completed in August 2019. Since then, he has been under observation.  In April 2025, a CT scan of the chest revealed a 0.7 cm noncalcified pulmonary nodule in the left upper lobe. A  recent repeat CT scan was performed to evaluate this finding.  He feels good with no new chest pain, shortness of breath, cough, nausea, vomiting, diarrhea, weight loss, headache, or changes in vision. No changes in his medications.    MEDICAL HISTORY: Past Medical History:  Diagnosis Date   Adenocarcinoma of right lung, stage 3 (HCC) 08/23/2016   Anemia    Arthritis    hx right hip   Asthma    when I was a child   Atrial fibrillation (HCC)    Amiodarone  started 10/2011; Coumadin    Automatic implantable cardioverter-defibrillator in situ 10/03/2012   a. St. Jude ICD implantation 10/03/12.   Chronic anticoagulation    Chronic fatigue 10/18/2016   Chronic systolic heart failure (HCC)    a. Echo 7/13: EF 25%;  b. echo 04/2012:  Mild LVH, EF 30-35%, Gr 1 DD, mild AI, mild MR, mild LAE   COPD (chronic obstructive pulmonary disease) (HCC)    Dyslipidemia    Gout    History of blood transfusion 10/15/2013   don't know where the blood's going; HgB down to 5   Hyperlipidemia    Hypertension    Hypothyroidism    NICM (nonischemic cardiomyopathy) (HCC)    LHC 4/14:  minimal CAD   Obesity    OSA on CPAP    Thoracic ascending aortic aneurysm (HCC) 07/25/2023   CT 07/25/23: TAA 4.1 cm; 7 mm LUL nodule    Tobacco abuse     ALLERGIES:  has no known allergies.  MEDICATIONS:  Current Outpatient Medications  Medication Sig Dispense Refill   albuterol  (VENTOLIN  HFA) 108 (90 Base) MCG/ACT inhaler INHALE 2 PUFFS INTO THE LUNGS EVERY 4 HOURS AS NEEDED FOR WHEEZING OR SHORTNESS  OF BREATH. 18 each 5   amiodarone  (PACERONE ) 200 MG tablet Take 1 tablet (200 mg total) by mouth daily. 90 tablet 0   atorvastatin  (LIPITOR) 10 MG tablet Take 1 tablet (10 mg total) by mouth daily. 90 tablet 3   Bempedoic Acid-Ezetimibe (NEXLIZET ) 180-10 MG TABS Take 1 tablet by mouth daily in the afternoon. 30 tablet 11   bisoprolol  (ZEBETA ) 5 MG tablet TAKE 1 TABLET (5 MG TOTAL) BY MOUTH DAILY. 90 tablet 3   [employer]  D3 2000 units CAPS Take 2,000 Units by mouth daily.   11   ferrous gluconate  (FERGON) 324 MG tablet TAKE 1 TABLET BY MOUTH THREE TIMES A DAY WITH MEALS 270 tablet 3   furosemide  (LASIX ) 20 MG tablet Take 3 tablets (60 mg in total) by mouth daily as needed for weight gain of 3-4 pounds in 24 hours. (Patient taking differently: Take 1.5 Tablets (60 mg Total) by mouth 2 (two) times a day) 90 tablet 0   JARDIANCE  10 MG TABS tablet Take 10 mg by mouth daily.     levothyroxine  (SYNTHROID ) 50 MCG tablet TAKE 1 TABLET BY MOUTH EVERY DAY BEFORE BREAKFAST 90 tablet 2   magnesium  oxide (MAG-OX) 400 (241.3 Mg) MG tablet Take 1 tablet (400 mg total) by mouth daily. 30 tablet 0   Polyvinyl Alcohol -Povidone (REFRESH OP) Place 1 drop into both eyes every morning.     potassium chloride  SA (KLOR-CON  M) 20 MEQ tablet Take one tablet (20 MEQ) by mouth daily as needed when you administer lasix  as needed. (Patient taking differently: Take 40 mEq by mouth daily. Take one tablet (20 MEQ) by mouth daily as needed when you administer lasix  as needed.) 30 tablet 3   Rivaroxaban  (XARELTO ) 15 MG TABS tablet Take 1 tablet (15 mg total) by mouth daily with supper. 90 tablet 1   sacubitril -valsartan  (ENTRESTO ) 97-103 MG Take 1 tablet by mouth 2 (two) times daily. 180 tablet 3   sildenafil  (VIAGRA ) 100 MG tablet Take 1 tablet (100 mg total) by mouth as needed for erectile dysfunction. 30 tablet 2   TRELEGY ELLIPTA  100-62.5-25 MCG/ACT AEPB INHALE 1 PUFF BY MOUTH EVERY DAY 180 each 3   ULORIC  40 MG tablet Take 1 tablet (40 mg total) by mouth daily. 30 tablet 0   No current facility-administered medications for this visit.    SURGICAL HISTORY:  Past Surgical History:  Procedure Laterality Date   CARDIAC DEFIBRILLATOR PLACEMENT  2014   CARDIOVERSION  2011   CARDIOVERSION N/A 03/15/2020   Procedure: CARDIOVERSION;  Surgeon: [name] Vina GAILS, MD;  Location: [facility] ENDOSCOPY;  Service: Cardiovascular;  Laterality: N/A;   COLONOSCOPY WITH  PROPOFOL  Left 10/17/2013   Procedure: COLONOSCOPY WITH PROPOFOL ;  Surgeon: Lamar JONETTA Aho, MD;  Location: [facility] ENDOSCOPY;  Service: Endoscopy;  Laterality: Left;   ESOPHAGOGASTRODUODENOSCOPY N/A 10/17/2013   Procedure: ESOPHAGOGASTRODUODENOSCOPY (EGD);  Surgeon: Lamar JONETTA Aho, MD;  Location: [facility] ENDOSCOPY;  Service: Endoscopy;  Laterality: N/A;   ESOPHAGOGASTRODUODENOSCOPY (EGD) WITH PROPOFOL  N/A 06/14/2018   Procedure: ESOPHAGOGASTRODUODENOSCOPY (EGD) WITH PROPOFOL ;  Surgeon: Legrand Victory LITTIE DOUGLAS, MD;  Location: [facility] ENDOSCOPY;  Service: Gastroenterology;  Laterality: N/A;   GIVENS CAPSULE STUDY N/A 10/29/2013   Procedure: GIVENS CAPSULE STUDY;  Surgeon: Lamar JONETTA Aho, MD;  Location: [facility] ENDOSCOPY;  Service: Endoscopy;  Laterality: N/A;   ICD GENERATOR CHANGEOUT N/A 02/13/2018   Procedure: ICD GENERATOR CHANGEOUT;  Surgeon: Inocencio Soyla Lunger, MD;  Location: [facility] INVASIVE CV LAB;  Service: Cardiovascular;  Laterality: N/A;  IMPLANTABLE CARDIOVERTER DEFIBRILLATOR IMPLANT Left 10/03/2012   Procedure: IMPLANTABLE CARDIOVERTER DEFIBRILLATOR IMPLANT;  Surgeon: Elspeth JAYSON Sage, MD;  Location: [facility] CATH LAB;  Service: Cardiovascular;  Laterality: Left;   IR FLUORO GUIDE PORT INSERTION RIGHT  09/21/2016   IR US  GUIDE VASC ACCESS RIGHT  09/21/2016   JOINT REPLACEMENT     REVERSE SHOULDER ARTHROPLASTY Right 09/22/2020   Procedure: SHOULDER HEMI ARTHROPLASTY;  Surgeon: Cristy Bonner DASEN, MD;  Location: [facility] ORS;  Service: Orthopedics;  Laterality: Right;   SAVORY DILATION N/A 06/14/2018   Procedure: SAVORY DILATION;  Surgeon: Legrand Victory LITTIE DOUGLAS, MD;  Location: [facility] ENDOSCOPY;  Service: Gastroenterology;  Laterality: N/A;   TONSILLECTOMY  1950's   TOTAL HIP ARTHROPLASTY Right 11/25/1997   V TACH ABLATION N/A 09/15/2021   Procedure: V TACH ABLATION;  Surgeon: [name] Danelle ORN, MD;  Location: [facility] INVASIVE CV LAB;  Service: Cardiovascular;  Laterality: N/A;   VIDEO BRONCHOSCOPY WITH ENDOBRONCHIAL ULTRASOUND N/A 08/09/2016   Procedure:  VIDEO BRONCHOSCOPY WITH ENDOBRONCHIAL ULTRASOUND;  Surgeon: Noreen Tonnie BRAVO, MD;  Location: [facility] OR;  Service: Thoracic;  Laterality: N/A;    REVIEW OF SYSTEMS:  A comprehensive review of systems was negative.   PHYSICAL EXAMINATION: General appearance: alert, cooperative, and no distress Head: Normocephalic, without obvious abnormality, atraumatic Neck: no adenopathy, no JVD, supple, symmetrical, trachea midline, and thyroid  not enlarged, symmetric, no tenderness/mass/nodules Lymph nodes: Cervical, supraclavicular, and axillary nodes normal. Resp: clear to auscultation bilaterally Back: symmetric, no curvature. ROM normal. No CVA tenderness. Cardio: regular rate and rhythm, S1, S2 normal, no murmur, click, rub or gallop GI: soft, non-tender; bowel sounds normal; no masses,  no organomegaly Extremities: extremities normal, atraumatic, no cyanosis or edema  ECOG PERFORMANCE STATUS: 1 - Symptomatic but completely ambulatory  Blood pressure (!) 124/44, pulse 88, temperature (!) 97.3 F (36.3 C), temperature source Temporal, resp. rate 17, height 6' 1 (1.854 m), weight (!) 301 lb (136.5 kg), SpO2 100%.  LABORATORY DATA: Lab Results  Component Value Date   WBC 4.7 11/12/2023   HGB 11.4 (L) 11/12/2023   HCT 35.9 (L) 11/12/2023   MCV 93.0 11/12/2023   PLT 178 11/12/2023      Chemistry      Component Value Date/Time   NA 144 11/12/2023 1442   NA 142 10/18/2023 1404   NA 141 03/29/2017 1038   K 3.9 11/12/2023 1442   K 4.0 03/29/2017 1038   CL 105 11/12/2023 1442   CO2 32 11/12/2023 1442   CO2 27 03/29/2017 1038   BUN 57 (H) 11/12/2023 1442   BUN 51 (H) 10/18/2023 1404   BUN 28.6 (H) 03/29/2017 1038   CREATININE 3.22 (H) 11/12/2023 1442   CREATININE 1.6 (H) 03/29/2017 1038      Component Value Date/Time   CALCIUM  9.6 11/12/2023 1442   CALCIUM  9.3 03/29/2017 1038   ALKPHOS 84 11/12/2023 1442   ALKPHOS 105 03/29/2017 1038   AST 15 11/12/2023 1442   AST 18 03/29/2017 1038    ALT 12 11/12/2023 1442   ALT 13 03/29/2017 1038   BILITOT 0.5 11/12/2023 1442   BILITOT 0.62 03/29/2017 1038       RADIOGRAPHIC STUDIES: MYOCARDIAL PERFUSION IMAGING Result Date: 11/15/2023   The study is normal. Findings are consistent with no ischemia and no infarction. The study is low risk.   No ST deviation was noted.   LV perfusion is normal. There is no evidence of ischemia. There is no evidence of infarction.   Left ventricular function  is normal. Nuclear stress EF: 40%. The left ventricular ejection fraction is moderately decreased (30-44%). End diastolic cavity size is normal. End systolic cavity size is normal.   CT images were obtained for attenuation correction and were examined for the presence of coronary calcium  when appropriate.   Coronary calcium  was present on the attenuation correction CT images. Severe coronary calcifications were present. Coronary calcifications were present in the left anterior descending artery, left circumflex artery and right coronary artery distribution(s).  Aortic atherosclerosis.   Prior study not available for comparison.   CUP PACEART INCLINIC DEVICE CHECK Result Date: 11/07/2023 Normal in-clinic single chamber ICD check. Presenting Rhythm: VS- 86. Routine testing was performed. Thresholds, sensing, and impedance demonstrate stable parameters and no programming changes needed. No treated arrhythmias. Estimated longevity 4.4-4.6 years. Pt enrolled in remote follow-up.Powell Level, BSN, RN    ASSESSMENT AND PLAN:  This is a very pleasant 73 years old African-American male with stage IIIA non-small cell lung cancer, adenocarcinoma.  The patient completed 6 weeks of concurrent chemoradiation with weekly carboplatin  and paclitaxel  and tolerated his treatment well except for odynophagia. The patient was then started on consolidation treatment with immunotherapy with Imfinzi  (Durvalumab ) status post 26 cycles.  He has been on observation for the last 6  years. He had repeat CT scan of the chest performed recently.  I personally and independently reviewed the scan images send the final report is still pending and discussed the results with the patient.  I do not see any clear evidence for disease progression but I will wait for the final report for confirmation. Assessment and Plan Assessment & Plan Stage IIIA non-small cell lung cancer, adenocarcinoma, left upper lobe Stage IIIA non-small cell lung cancer, adenocarcinoma of the left upper lobe, initially diagnosed in March 2018. Underwent concurrent chemoradiation with carboplatin  and paclitaxel , followed by one year of consolidation treatment with Imfinzi , completed in August 2019. Currently under observation with no new symptoms reported. Recent CT scan reviewed showed no visible nodule, pending official radiology report. - Await official radiology report of recent CT scan - If CT scan is clear, schedule follow-up in one year with repeat scan and labs before the visit - If CT scan shows concerning findings, schedule earlier follow-up  Resolved left upper lobe pulmonary nodule Previously identified 0.7 cm noncalcified pulmonary nodule in the left upper lobe on CT scan in April 2025. Recent CT scan reviewed showed no visible nodule, pending official radiology report. - Await official radiology report of recent CT scan - If CT scan is clear, schedule follow-up in one year with repeat scan and labs before the visit - If CT scan shows concerning findings, schedule earlier follow-up He was advised to call immediately if he has any other concerning symptoms in the interval. For the renal insufficiency, he is followed by nephrology and he was advised to avoid any NSAIDs and discussed with his nephrologist for any additional recommendation.  The patient voices understanding of current disease status and treatment options and is in agreement with the current care plan. All questions were answered. The  patient knows to call the clinic with any problems, questions or concerns. We can certainly see the patient much sooner if necessary. Disclaimer: This note was dictated with voice recognition software. Similar sounding words can inadvertently be transcribed and may not be corrected upon review.

## 2023-11-20 ENCOUNTER — Telehealth: Payer: Self-pay | Admitting: Internal Medicine

## 2023-11-20 NOTE — Telephone Encounter (Signed)
 Scheduled appointments per 8/18 los. Talked with the patient and he is aware of the made appointments.

## 2023-11-21 ENCOUNTER — Ambulatory Visit: Payer: Self-pay | Admitting: Cardiology

## 2023-11-23 ENCOUNTER — Other Ambulatory Visit: Payer: Self-pay | Admitting: Internal Medicine

## 2023-12-04 ENCOUNTER — Ambulatory Visit: Attending: Internal Medicine

## 2023-12-04 DIAGNOSIS — Z95810 Presence of automatic (implantable) cardiac defibrillator: Secondary | ICD-10-CM | POA: Diagnosis not present

## 2023-12-04 DIAGNOSIS — I5022 Chronic systolic (congestive) heart failure: Secondary | ICD-10-CM

## 2023-12-05 ENCOUNTER — Telehealth: Payer: Self-pay

## 2023-12-05 NOTE — Telephone Encounter (Signed)
 Remote ICM transmission received.  Attempted call to patient regarding ICM remote transmission and left detailed message per DPR.  Left ICM phone number and advised to return call for any fluid symptoms or questions. Next ICM remote transmission scheduled 01/14/2024.

## 2023-12-05 NOTE — Progress Notes (Signed)
 EPIC Encounter for ICM Monitoring  Patient Name: Jason Russell is a 73 y.o. male Date: 12/05/2023 Primary Care Physican: [name], MD Primary Cardiologist: [name] Electrophysiologist: [name] 09/13/2022 Weight: 280 lbs 05/21/2022 Weight: 288 lbs 06/25/2023 Weight: 288 lbs 09/05/2023 Weight: 290 lbs 10/30/2023 Weight: 286 lbs (Highest weight last week was 290 lbs). 10/31/2023 Office Weight: 300 lbs 11/12/2023 Weight: 292   Attempted call to patient and unable to reach.  Left detailed message per DPR regarding transmission.  Transmission results reviewed.     Diet:  Cooks at home most of the time and occasionally eats out a meal.     CorVue Thoracic impedance suggesting normal fluid levels since 8/5.   Prescribed:  Furosemide  20 mg take 3 tablet(s) (60 mg total) by mouth daily as needed for weight gain of 3-4 pounds within 24 hours.  8/4 Pt confirmed with kidney physician his dosage should be 40 mg bid  Potassium 20 mEq 1 tablet by mouth daily as needed when you take Lasix  as needed.   7/29 Pt reports increased to 40 mEq by mouth daily by Nephrologist at 7/28 OV.   Labs: 11/12/2023 Creatinine 3.22, BUN 57, Potassium 3.9, Sodium 144, GFR 20 10/26/2023 Creatinine 2.50, BUN 39, Potassium 3.4, Sodium 142, GFR 27  10/18/2023 Creatinine 2.46, BUN 51, Potassium 4.1, Sodium 142, GFR 27  10/12/2023 Creatinine 3.34, BUN 60, Potassium 5.2, Sodium 142, GFR 19  10/04/2023 Creatinine 3.35, BUN 63, Potassium 3.8, Sodium 140 09/28/2023 Creatinine 2.47, BUN 29, Potassium 3.7, Sodium 142  09/27/2023 Creatinine 2.08, BUN 21, Potassium 3.2, Sodium 140  09/26/2023 Creatinine 2.01, BUN 19, Potassium 4.1, Sodium 142  A complete set of results can be found in results review   Recommendations:  Left voice mail with ICM number and encouraged to call if experiencing any fluid symptoms.   Follow-up plan: ICM clinic phone appointment on 01/15/2024.   91 day device clinic remote transmission 01/14/2024.      EP/Cardiology Office Visits:   02/07/2024 with Dr [name].  Recall 11/02/2024 with Dr Almetta.    Copy of ICM check sent to Dr. [name].  3 month ICM trend: 12/04/2023.    12-14 Month ICM trend:     [name], RN 12/05/2023 3:01 PM

## 2023-12-13 ENCOUNTER — Other Ambulatory Visit: Payer: Self-pay | Admitting: Internal Medicine

## 2023-12-24 DIAGNOSIS — E038 Other specified hypothyroidism: Secondary | ICD-10-CM | POA: Diagnosis not present

## 2023-12-24 DIAGNOSIS — I119 Hypertensive heart disease without heart failure: Secondary | ICD-10-CM | POA: Diagnosis not present

## 2023-12-24 DIAGNOSIS — N185 Chronic kidney disease, stage 5: Secondary | ICD-10-CM | POA: Diagnosis not present

## 2023-12-24 DIAGNOSIS — Z6839 Body mass index (BMI) 39.0-39.9, adult: Secondary | ICD-10-CM | POA: Diagnosis not present

## 2023-12-24 DIAGNOSIS — E782 Mixed hyperlipidemia: Secondary | ICD-10-CM | POA: Diagnosis not present

## 2023-12-24 DIAGNOSIS — I4891 Unspecified atrial fibrillation: Secondary | ICD-10-CM | POA: Diagnosis not present

## 2023-12-24 DIAGNOSIS — C3491 Malignant neoplasm of unspecified part of right bronchus or lung: Secondary | ICD-10-CM | POA: Diagnosis not present

## 2023-12-24 DIAGNOSIS — I132 Hypertensive heart and chronic kidney disease with heart failure and with stage 5 chronic kidney disease, or end stage renal disease: Secondary | ICD-10-CM | POA: Diagnosis not present

## 2023-12-24 DIAGNOSIS — I77810 Thoracic aortic ectasia: Secondary | ICD-10-CM | POA: Diagnosis not present

## 2023-12-26 ENCOUNTER — Other Ambulatory Visit: Payer: Self-pay | Admitting: Student

## 2023-12-31 DIAGNOSIS — I110 Hypertensive heart disease with heart failure: Secondary | ICD-10-CM | POA: Diagnosis not present

## 2023-12-31 DIAGNOSIS — E782 Mixed hyperlipidemia: Secondary | ICD-10-CM | POA: Diagnosis not present

## 2023-12-31 DIAGNOSIS — N183 Chronic kidney disease, stage 3 unspecified: Secondary | ICD-10-CM | POA: Diagnosis not present

## 2023-12-31 DIAGNOSIS — E6609 Other obesity due to excess calories: Secondary | ICD-10-CM | POA: Diagnosis not present

## 2023-12-31 DIAGNOSIS — I509 Heart failure, unspecified: Secondary | ICD-10-CM | POA: Diagnosis not present

## 2023-12-31 DIAGNOSIS — E1122 Type 2 diabetes mellitus with diabetic chronic kidney disease: Secondary | ICD-10-CM | POA: Diagnosis not present

## 2024-01-02 ENCOUNTER — Ambulatory Visit (HOSPITAL_COMMUNITY)
Admission: RE | Admit: 2024-01-02 | Discharge: 2024-01-02 | Disposition: A | Discharge status: Home / Self Care | Source: Ambulatory Visit | Attending: Cardiovascular Disease | Admitting: Cardiovascular Disease

## 2024-01-02 DIAGNOSIS — R911 Solitary pulmonary nodule: Secondary | ICD-10-CM | POA: Diagnosis not present

## 2024-01-02 DIAGNOSIS — I700 Atherosclerosis of aorta: Secondary | ICD-10-CM | POA: Diagnosis not present

## 2024-01-02 DIAGNOSIS — J432 Centrilobular emphysema: Secondary | ICD-10-CM | POA: Diagnosis not present

## 2024-01-04 ENCOUNTER — Other Ambulatory Visit: Payer: Self-pay | Admitting: Internal Medicine

## 2024-01-09 ENCOUNTER — Other Ambulatory Visit: Payer: Self-pay | Admitting: Internal Medicine

## 2024-01-10 NOTE — Progress Notes (Signed)
 Remote ICD Transmission

## 2024-01-11 ENCOUNTER — Ambulatory Visit: Payer: Self-pay | Admitting: Physician Assistant

## 2024-01-14 ENCOUNTER — Ambulatory Visit: Payer: Medicare Other

## 2024-01-14 DIAGNOSIS — I5022 Chronic systolic (congestive) heart failure: Secondary | ICD-10-CM

## 2024-01-14 LAB — CUP PACEART REMOTE DEVICE CHECK
Battery Remaining Longevity: 52 mo
Battery Remaining Percentage: 48 %
Battery Voltage: 2.96 V
Brady Statistic RV Percent Paced: 1 %
Date Time Interrogation Session: 20251013020015
HighPow Impedance: 44 Ohm
HighPow Impedance: 44 Ohm
Implantable Lead Connection Status: 753985
Implantable Lead Implant Date: 20140703
Implantable Lead Location: 753860
Implantable Pulse Generator Implant Date: 20191113
Lead Channel Impedance Value: 450 Ohm
Lead Channel Pacing Threshold Amplitude: 0.5 V
Lead Channel Pacing Threshold Pulse Width: 0.5 ms
Lead Channel Sensing Intrinsic Amplitude: 5.7 mV
Lead Channel Setting Pacing Amplitude: 2.5 V
Lead Channel Setting Pacing Pulse Width: 0.5 ms
Lead Channel Setting Sensing Sensitivity: 0.5 mV
Pulse Gen Serial Number: 9820959
Zone Setting Status: 755011

## 2024-01-15 ENCOUNTER — Other Ambulatory Visit: Payer: Self-pay | Admitting: Internal Medicine

## 2024-01-15 ENCOUNTER — Ambulatory Visit: Attending: Internal Medicine

## 2024-01-15 DIAGNOSIS — I5022 Chronic systolic (congestive) heart failure: Secondary | ICD-10-CM | POA: Diagnosis not present

## 2024-01-15 DIAGNOSIS — Z95810 Presence of automatic (implantable) cardiac defibrillator: Secondary | ICD-10-CM | POA: Diagnosis not present

## 2024-01-15 NOTE — Progress Notes (Signed)
 Remote ICD Transmission

## 2024-01-16 ENCOUNTER — Other Ambulatory Visit: Payer: Self-pay | Admitting: Internal Medicine

## 2024-01-16 DIAGNOSIS — N1832 Chronic kidney disease, stage 3b: Secondary | ICD-10-CM

## 2024-01-16 DIAGNOSIS — Z79899 Other long term (current) drug therapy: Secondary | ICD-10-CM

## 2024-01-16 DIAGNOSIS — Z7901 Long term (current) use of anticoagulants: Secondary | ICD-10-CM

## 2024-01-16 MED ORDER — RIVAROXABAN 15 MG PO TABS: 15.0000 mg | ORAL_TABLET | Freq: Every day | ORAL | 1 refills | Status: AC

## 2024-01-16 NOTE — Telephone Encounter (Signed)
 Xarelto  15mg  refill request received. Pt is 73 years old, weight-136.5kg, Crea-3.22 on 11/12/23, last seen by Dr. [name] on 11/07/23, Diagnosis-Afib, CrCl-40.04 mL/min; Dose is appropriate based on dosing criteria. Will send in refill to requested pharmacy.

## 2024-01-16 NOTE — Progress Notes (Signed)
 EPIC Encounter for ICM Monitoring  Patient Name: Jason Russell is a 73 y.o. male Date: 01/16/2024 Primary Care Physican: [name], MD Primary Cardiologist: [name] Electrophysiologist: [name] 09/13/2022 Weight: 280 lbs 05/21/2022 Weight: 288 lbs 06/25/2023 Weight: 288 lbs 09/05/2023 Weight: 290 lbs 10/30/2023 Weight: 286 lbs (Highest weight last week was 290 lbs). 10/31/2023 Office Weight: 300 lbs 11/12/2023 Weight: 292 lbs 01/16/2024 Weight:  292 lbs   Spoke with patient and heart failure questions reviewed.  Transmission results reviewed.  Pt asymptomatic for fluid accumulation.  Reports feeling well at this time and voices no complaints.      Diet:  Cooks at home most of the time and occasionally eats out a meal.     Sine 12/04/2023 ICM Remote Transmission: CorVue Thoracic impedance suggesting normal fluid levels.   Prescribed:  Furosemide  20 mg take 3 tablet(s) (60 mg total) by mouth daily as needed for weight gain of 3-4 pounds within 24 hours.  01/16/2024 Pt confirmed with kidney physician his dosage should be 40 mg bid  Potassium 20 mEq 1 tablet by mouth daily as needed when you take Lasix  as needed.   01/16/2024 Pt reports increased to 40 mEq by mouth daily by Nephrologist at 7/28 OV.   Labs: 11/12/2023 Creatinine 3.22, BUN 57, Potassium 3.9, Sodium 144, GFR 20 10/26/2023 Creatinine 2.50, BUN 39, Potassium 3.4, Sodium 142, GFR 27  10/18/2023 Creatinine 2.46, BUN 51, Potassium 4.1, Sodium 142, GFR 27  10/12/2023 Creatinine 3.34, BUN 60, Potassium 5.2, Sodium 142, GFR 19  10/04/2023 Creatinine 3.35, BUN 63, Potassium 3.8, Sodium 140 09/28/2023 Creatinine 2.47, BUN 29, Potassium 3.7, Sodium 142  09/27/2023 Creatinine 2.08, BUN 21, Potassium 3.2, Sodium 140  09/26/2023 Creatinine 2.01, BUN 19, Potassium 4.1, Sodium 142  A complete set of results can be found in results review   Recommendations:  No changes and encouraged to call if experiencing any fluid symptoms.   Follow-up  plan: ICM clinic phone appointment on 02/18/2024.   91 day device clinic remote transmission 04/14/2024.     EP/Cardiology Office Visits:   02/07/2024 with Dr [name].  Recall 11/02/2024 with Dr Almetta.    Copy of ICM check sent to Dr. [name].  Remote monitoring is medically necessary for Heart Failure Management.    Daily Thoracic Impedance ICM trend: 10/17/2023 through 01/15/2024.    12-14 Month Thoracic Impedance ICM trend:     [name], RN 01/16/2024 5:09 PM

## 2024-01-17 ENCOUNTER — Ambulatory Visit: Payer: Self-pay | Admitting: Internal Medicine

## 2024-01-17 MED ORDER — SILDENAFIL CITRATE 100 MG PO TABS: 100.0000 mg | ORAL_TABLET | ORAL | 2 refills | Status: AC | PRN

## 2024-01-21 ENCOUNTER — Telehealth: Payer: Self-pay | Admitting: Cardiology

## 2024-01-21 ENCOUNTER — Telehealth: Payer: Self-pay | Admitting: Internal Medicine

## 2024-01-21 MED ORDER — NEXLIZET 180-10 MG PO TABS: 1.0000 | ORAL_TABLET | Freq: Every day | ORAL | 2 refills | Status: AC

## 2024-01-21 NOTE — Telephone Encounter (Signed)
 Pt's medication was sent to pt's pharmacy as requested. Confirmation received.

## 2024-01-21 NOTE — Telephone Encounter (Signed)
*  STAT* If patient is at the pharmacy, call can be transferred to refill team.   1. Which medications need to be refilled? (please list name of each medication and dose if known) Bempedoic Acid-Ezetimibe (NEXLIZET ) 180-10 MG TABS   2. Which pharmacy/location (including street and city if local pharmacy) is medication to be sent to? [id number] - [place], [place] - [address]   3. Do they need a 30 day or 90 day supply? 90

## 2024-01-21 NOTE — Telephone Encounter (Signed)
  error

## 2024-01-21 NOTE — Telephone Encounter (Signed)
 Patient is on Jardiance  not farxiga . Patient does not need refills at this time.

## 2024-01-29 DIAGNOSIS — I110 Hypertensive heart disease with heart failure: Secondary | ICD-10-CM | POA: Diagnosis not present

## 2024-01-29 DIAGNOSIS — I779 Disorder of arteries and arterioles, unspecified: Secondary | ICD-10-CM | POA: Diagnosis not present

## 2024-01-29 DIAGNOSIS — Z1211 Encounter for screening for malignant neoplasm of colon: Secondary | ICD-10-CM | POA: Diagnosis not present

## 2024-02-07 ENCOUNTER — Ambulatory Visit: Admitting: Internal Medicine

## 2024-02-07 DIAGNOSIS — E876 Hypokalemia: Secondary | ICD-10-CM | POA: Diagnosis not present

## 2024-02-07 NOTE — Progress Notes (Unsigned)
Subjective:     Jason Russell is a 73 year old male, patient of DR. 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 Went to ER on 1/20 for back pain. Pw urinary incontinence. Found to have UTI. MRI of lumbar spine without signs of cauda equina. Patient has neuroforaminal stenosis and chronic findings noted no acute process however to lumbar spine       MRI L-Spine Without Contrast   Final Result   IMPRESSION:   1. Multilevel degenerative disc disease is worst at the L2/L3 level with   moderate right neuroforaminal narrowing and likely contact of the exiting   right L2 nerve root, similar to the 01/07/2022 study.     2. Less prominent degenerative disc disease is also noted at the L3/L4   level with mild/moderate left neuroforaminal narrowing and possible   contact of the exiting left L3 nerve root, unchanged.     3. No fracture or acute injury. Appearance is similar to the 01/07/2022   study.     4. No suggestion of cauda equina syndrome.       Review of Systems     I reviewed the patient's recorded medical history and confirmed the {:109547::"medications","problem list","allergies","past medical history"}.      Objective:   Vitals: There were no vitals taken for this visit.  Physical Exam       Assessment and Plan:   {DIAGNONUM:3673001}      Follow up with PCP if s/s change or worsen.    Patient agrees with plan of care.  Discussed with the patient and all questioned fully answered. Jason Russell will call me if any problems arise.     See patient education.     Rim  Sem, ARNP    I spent a total of *** minutes for the patient's care on the date of the service.  {Vanishing Tip  When performed by provider on date of visit, these count toward total time  Chart review   History taking  Physical exam  Counseling, educating patient/family/caregiver  Orders   Referrals and communication  with other providers (not separately reported)  Documentation  Care coordination (not separately reported)  Independent interpretation of results (not separately reported) and communicating results to patient/family/caregiver  :999}      --------------------------------------    Patient Active Problem List   Diagnosis    Congenital hereditary muscular dystrophy (HCC)    Hereditary retinal dystrophy, unspecified    Bipolar I disorder, most recent episode (or current) depressed, unspecified    Memory change    Mitochondrial myopathy    Restrictive lung disease    Sleep-disordered breathing    Fibromyalgia    Pelvic floor dysfunction    Mixed stress and urge urinary incontinence    Other constipation    Anxiety    Carnitine deficiency (HCC)    Difficulty voiding    Gastroesophageal reflux disease without esophagitis    Hearing loss    Irritable bowel syndrome    Legal blindness    Neuropathy, ataxia and retinitis pigmentosa (HCC)    Perimenopausal vasomotor symptoms    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      cloBAZam 10 MG tablet Take 5 mg QHS for 1 week, then 10 mg QHS.      cyanocobalamin 1000 MCG tablet Take 2 tablets (2,000 mcg) by mouth daily.      diclofenac 1 % gel Apply 2 g topically 4 times a day. Apply to back and toe as needed. 150 g 5    FLUoxetine 10 MG capsule Take 2 capsules (20 mg) by mouth daily.      lamoTRIgine 150 MG tablet TAKE TWO (2) TABLETS BY MOUTH TWICE DAILY 120 tablet 10    levOCARNitine 1 GM/10ML solution take 10 milliliter by mouth three times a day 2700 mL 3    lidocaine 5 % patch Apply 1 patch onto the skin every 24 hours. Apply to painful area for up to 12 hours in a 24-hour period. 10 patch 0    loperamide 2 MG capsule Take 2 capsules at onset of diarrhea, then 1 capsule after each non  formed stool up to 8 capsules in 24 hours 16 capsule 0    Magnesium Citrate 100 MG tablet Take by mouth.      methocarbamol 500 MG tablet Take 1 tablet (500 mg) by mouth every 6 hours as needed for muscle spasms. 30 tablet 1    Multiple Vitamins-Calcium (TGT DAILY MULTIVITAMIN WOMENS OR) Take 1 tablet by mouth daily.      omeprazole 20 MG DR capsule Take 1 capsule (20 mg) by mouth 2 times a day. 180 capsule 3    polyethylene glycol 3350 17 GM/SCOOP oral powder Take 17 g by mouth 2 times a day. Fill cap with powder to the 17 gram mark and dissolve in 4 to 8 ounces of water. Titrate to effect (one bowel movement per day) 850 g 6     No facility-administered medications prior to visit.

## 2024-02-18 ENCOUNTER — Ambulatory Visit: Attending: Internal Medicine

## 2024-02-18 DIAGNOSIS — Z95810 Presence of automatic (implantable) cardiac defibrillator: Secondary | ICD-10-CM | POA: Diagnosis not present

## 2024-02-18 DIAGNOSIS — I5022 Chronic systolic (congestive) heart failure: Secondary | ICD-10-CM

## 2024-02-18 NOTE — Progress Notes (Signed)
 EPIC Encounter for ICM Monitoring  Patient Name: Jason Russell is a 73 y.o. male Date: 02/18/2024 Primary Care Physican: [name], MD Primary Cardiologist: [name] Electrophysiologist: [name] 09/13/2022 Weight: 280 lbs 05/21/2022 Weight: 288 lbs 06/25/2023 Weight: 288 lbs 09/05/2023 Weight: 290 lbs 10/30/2023 Weight: 286 lbs (Highest weight last week was 290 lbs). 10/31/2023 Office Weight: 300 lbs 11/12/2023 Weight: 292 lbs 01/16/2024 Weight: 292 lbs   Spoke with patient and heart failure questions reviewed.  Transmission results reviewed.  Pt asymptomatic for fluid accumulation.  Reports feeling well at this time and voices no complaints.  He did not take his Lasix  while on vacation in [place] during decreased impedance.    Diet:  Cooks at home most of the time and occasionally eats out a meal.     Since 01/15/2024 ICM Remote Transmission: [employer]ue Thoracic impedance suggesting normal fluid levels with the exception of possible fluid accumulation from 01/27/2024-02/07/2024.   Prescribed:  Furosemide  20 mg take 3 tablet(s) (60 mg total) by mouth daily as needed for weight gain of 3-4 pounds within 24 hours.  01/16/2024 Pt confirmed with kidney physician his dosage should be 40 mg bid  Potassium 20 mEq 1 tablet by mouth daily as needed when you take Lasix  as needed.   01/16/2024 Pt reports increased to 40 mEq by mouth daily by Nephrologist at 10/29/2023 OV.   Labs: 11/12/2023 Creatinine 3.22, BUN 57, Potassium 3.9, Sodium 144, GFR 20 10/26/2023 Creatinine 2.50, BUN 39, Potassium 3.4, Sodium 142, GFR 27  10/18/2023 Creatinine 2.46, BUN 51, Potassium 4.1, Sodium 142, GFR 27  10/12/2023 Creatinine 3.34, BUN 60, Potassium 5.2, Sodium 142, GFR 19  10/04/2023 Creatinine 3.35, BUN 63, Potassium 3.8, Sodium 140 09/28/2023 Creatinine 2.47, BUN 29, Potassium 3.7, Sodium 142  09/27/2023 Creatinine 2.08, BUN 21, Potassium 3.2, Sodium 140  09/26/2023 Creatinine 2.01, BUN 19, Potassium 4.1, Sodium 142  A  complete set of results can be found in results review   Recommendations:  No changes and encouraged to call if experiencing any fluid symptoms.   Follow-up plan: ICM clinic phone appointment on 03/31/2024.   91 day device clinic remote transmission 04/14/2024.     EP/Cardiology Office Visits:   04/18/2024 with Dr [name].  Recall 11/02/2024 with Dr Almetta.  02/22/2024 with Nephrologist.    Copy of ICM check sent to Dr. [name].  Remote monitoring is medically necessary for Heart Failure Management.    Daily Thoracic Impedance ICM trend: 11/20/2023 through 02/18/2024.    12-14 Month Thoracic Impedance ICM trend:     [name], RN 02/18/2024 11:13 AM

## 2024-02-19 ENCOUNTER — Other Ambulatory Visit: Payer: Self-pay | Admitting: Internal Medicine

## 2024-02-19 DIAGNOSIS — N183 Chronic kidney disease, stage 3 unspecified: Secondary | ICD-10-CM | POA: Diagnosis not present

## 2024-02-19 DIAGNOSIS — I129 Hypertensive chronic kidney disease with stage 1 through stage 4 chronic kidney disease, or unspecified chronic kidney disease: Secondary | ICD-10-CM | POA: Diagnosis not present

## 2024-02-22 DIAGNOSIS — D631 Anemia in chronic kidney disease: Secondary | ICD-10-CM | POA: Diagnosis not present

## 2024-02-22 DIAGNOSIS — E889 Metabolic disorder, unspecified: Secondary | ICD-10-CM | POA: Diagnosis not present

## 2024-02-22 DIAGNOSIS — E559 Vitamin D deficiency, unspecified: Secondary | ICD-10-CM | POA: Diagnosis not present

## 2024-02-22 DIAGNOSIS — I129 Hypertensive chronic kidney disease with stage 1 through stage 4 chronic kidney disease, or unspecified chronic kidney disease: Secondary | ICD-10-CM | POA: Diagnosis not present

## 2024-02-22 DIAGNOSIS — N1832 Chronic kidney disease, stage 3b: Secondary | ICD-10-CM | POA: Diagnosis not present

## 2024-03-03 ENCOUNTER — Other Ambulatory Visit: Payer: Self-pay | Admitting: Internal Medicine

## 2024-03-03 MED ORDER — FERROUS GLUCONATE 324 (38 FE) MG PO TABS: 324.0000 mg | ORAL_TABLET | Freq: Three times a day (TID) | ORAL | 3 refills | Status: AC

## 2024-03-03 NOTE — Telephone Encounter (Signed)
 Will send to Dr. [name].

## 2024-03-03 NOTE — Telephone Encounter (Signed)
*  STAT* If patient is at the pharmacy, call can be transferred to refill team.   1. Which medications need to be refilled? (please list name of each medication and dose if known)  ferrous gluconate  (FERGON) 324 MG tablet    2. Would you like to learn more about the convenience, safety, & potential cost savings by using the [facility]?     3. Are you open to using the [facility] (Type [facility]. ).   4. Which pharmacy/location (including street and city if local pharmacy) is medication to be sent to? [id number] - [place], [place] - [address]    5. Do they need a 30 day or 90 day supply? 90 day

## 2024-03-08 ENCOUNTER — Other Ambulatory Visit: Payer: Self-pay | Admitting: Internal Medicine

## 2024-03-17 ENCOUNTER — Encounter (HOSPITAL_BASED_OUTPATIENT_CLINIC_OR_DEPARTMENT_OTHER): Payer: Self-pay

## 2024-03-31 ENCOUNTER — Ambulatory Visit: Attending: Internal Medicine

## 2024-03-31 ENCOUNTER — Other Ambulatory Visit: Payer: Self-pay

## 2024-03-31 DIAGNOSIS — I5022 Chronic systolic (congestive) heart failure: Secondary | ICD-10-CM

## 2024-03-31 DIAGNOSIS — Z95810 Presence of automatic (implantable) cardiac defibrillator: Secondary | ICD-10-CM

## 2024-03-31 NOTE — Progress Notes (Signed)
 EPIC Encounter for ICM Monitoring  Patient Name: Jason Russell is a 73 y.o. male Date: 03/31/2024 Primary Care Physican: [name], MD (Inactive) Primary Cardiologist: [name] Electrophysiologist: [name] 09/13/2022 Weight: 280 lbs 05/21/2022 Weight: 288 lbs 06/25/2023 Weight: 288 lbs 09/05/2023 Weight: 290 lbs 10/30/2023 Weight: 286 lbs (Highest weight last week was 290 lbs). 10/31/2023 Office Weight: 300 lbs 11/12/2023 Weight: 292 lbs 01/16/2024 Weight: 292 lbs 03/31/2024 Weight: 292 lbs   Spoke with patient and heart failure questions reviewed.  Transmission results reviewed.  Pt asymptomatic for fluid accumulation.  Reports feeling well at this time and voices no complaints.      Diet:  Cooks at home most of the time and occasionally eats out a meal.     Since 02/18/2024 ICM Remote Transmission: CorVue Thoracic impedance suggesting normal fluid levels.   Prescribed:  Furosemide  20 mg take 3 tablet(s) (60 mg total) by mouth daily as needed for weight gain of 3-4 pounds within 24 hours.  01/16/2024 Pt confirmed with kidney physician his dosage should be 40 mg bid  Potassium 20 mEq 1 tablet by mouth daily as needed when you take Lasix  as needed.   01/16/2024 Pt reports increased to 40 mEq by mouth daily by Nephrologist at 10/29/2023 OV.   Labs: 02/19/2024 Creatinine 2.63, BUN 44, Potassium 3.2, Sodium 141, GFR 25 11/12/2023 Creatinine 3.22, BUN 57, Potassium 3.9, Sodium 144, GFR 20 10/26/2023 Creatinine 2.50, BUN 39, Potassium 3.4, Sodium 142, GFR 27  10/18/2023 Creatinine 2.46, BUN 51, Potassium 4.1, Sodium 142, GFR 27  10/12/2023 Creatinine 3.34, BUN 60, Potassium 5.2, Sodium 142, GFR 19  10/04/2023 Creatinine 3.35, BUN 63, Potassium 3.8, Sodium 140 09/28/2023 Creatinine 2.47, BUN 29, Potassium 3.7, Sodium 142  09/27/2023 Creatinine 2.08, BUN 21, Potassium 3.2, Sodium 140  09/26/2023 Creatinine 2.01, BUN 19, Potassium 4.1, Sodium 142  A complete set of results can be found in results  review   Recommendations:  No changes and encouraged to call if experiencing any fluid symptoms.   Follow-up plan: ICM clinic phone appointment on 05/05/2024.   91 day device clinic remote transmission 04/14/2024.     EP/Cardiology Office Visits:   04/18/2024 with Dr [name].  Recall 11/02/2024 with Dr Almetta.     Copy of ICM check sent to Dr. [name].   Remote monitoring is medically necessary for Heart Failure Management.    Daily Thoracic Impedance ICM trend: 01/01/2024 through 03/21/2024.    12-14 Month Thoracic Impedance ICM trend:     [name], RN 03/31/2024 8:05 AM

## 2024-04-02 MED ORDER — AMIODARONE HCL 200 MG PO TABS: 200.0000 mg | ORAL_TABLET | Freq: Every day | ORAL | 2 refills | Status: AC

## 2024-04-14 ENCOUNTER — Ambulatory Visit: Payer: Medicare Other

## 2024-04-14 DIAGNOSIS — I5022 Chronic systolic (congestive) heart failure: Secondary | ICD-10-CM | POA: Diagnosis not present

## 2024-04-15 LAB — CUP PACEART REMOTE DEVICE CHECK
Battery Remaining Longevity: 50 mo
Battery Remaining Percentage: 46 %
Battery Voltage: 2.96 V
Brady Statistic RV Percent Paced: 1 %
Date Time Interrogation Session: 20260112020022
HighPow Impedance: 43 Ohm
HighPow Impedance: 43 Ohm
Implantable Lead Connection Status: 753985
Implantable Lead Implant Date: 20140703
Implantable Lead Location: 753860
Implantable Pulse Generator Implant Date: 20191113
Lead Channel Impedance Value: 480 Ohm
Lead Channel Pacing Threshold Amplitude: 0.5 V
Lead Channel Pacing Threshold Pulse Width: 0.5 ms
Lead Channel Sensing Intrinsic Amplitude: 6 mV
Lead Channel Setting Pacing Amplitude: 2.5 V
Lead Channel Setting Pacing Pulse Width: 0.5 ms
Lead Channel Setting Sensing Sensitivity: 0.5 mV
Pulse Gen Serial Number: 9820959
Zone Setting Status: 755011

## 2024-04-17 NOTE — Progress Notes (Signed)
 "   Cardiology Office Note   Date: 04/18/2024   ID:  Jason Russell, DOB 1951/02/29, MRN [id number]  PCP:  [name], MD  Cardiologist:   Vina Gull, MD   Pt presents for follow up of HFrEF, CAD, VT, PAF and aortic aneurysm   History of Present Illness:   Jason Russell is a 74 y.o. male with hx of CAD (cath 2014), HFrEF, PAF, VT, HTN, aortic aneurysm, CKD, OSA,lung CA (s/p XRT and chemo).  July 2013 with worsening SOB  Found to be in Afib with RVR Converted spontaneously. 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    Pt diuresed with IV lasix     July 2025  CT of chest   Asc aorta 4.7 cm   No nodule in lung  AUg 2025   Myoview  showed   Oct 2025  CT of chest   Sinus of valsalva 4.8 cm (unchanged)  Asc aorta 3.9  Tiny nodule 3 mm RUL    The pt says he is feeling good   He denies CP   No dizziness Breathing is OK  No significant LE edema   No palpitations      Current Meds  Medication Sig   albuterol  (VENTOLIN  HFA) 108 (90 Base) MCG/ACT inhaler INHALE 2 PUFFS INTO THE LUNGS EVERY 4 HOURS AS NEEDED FOR WHEEZING OR  SHORTNESS OF BREATH.   amiodarone  (PACERONE ) 200 MG tablet Take 1 tablet (200 mg total) by mouth daily.   atorvastatin  (LIPITOR) 40 MG tablet TAKE 1 TABLET BY MOUTH EVERY DAY   Bempedoic Acid-Ezetimibe (NEXLIZET ) 180-10 MG TABS Take 1 tablet by mouth daily in the afternoon.   bisoprolol  (ZEBETA ) 5 MG tablet TAKE 1 TABLET (5 MG TOTAL) BY MOUTH DAILY.   [employer] D3 2000 units CAPS Take 2,000 Units by mouth daily.    ferrous gluconate  (FERGON) 324 MG tablet Take 1 tablet (324 mg total) by mouth 3 (three) times daily with meals.   furosemide  (LASIX ) 20 MG tablet Take 3 tablets (60 mg in total) by mouth daily as needed for weight gain of 3-4 pounds in 24 hours. (Patient taking differently: Take 1.5 Tablets (60 mg Total) by mouth 2 (two) times a day)   JARDIANCE  10 MG TABS tablet  Take 10 mg by mouth daily.   levothyroxine  (SYNTHROID ) 50 MCG tablet TAKE 1 TABLET BY MOUTH EVERY DAY BEFORE BREAKFAST   magnesium  oxide (MAG-OX) 400 (241.3 Mg) MG tablet Take 1 tablet (400 mg total) by mouth daily.   Polyvinyl Alcohol -Povidone (REFRESH OP) Place 1 drop into both eyes every morning.   Potassium Chloride  ER 20 MEQ TBCR TAKE 1 TABLET BY MOUTH EVERY DAY   Rivaroxaban  (XARELTO ) 15 MG TABS tablet Take 1 tablet (15 mg total) by mouth daily with supper.   sacubitril -valsartan  (ENTRESTO ) 97-103 MG TAKE 1 TABLET BY MOUTH TWICE A DAY   sildenafil  (VIAGRA ) 100 MG tablet Take 1 tablet (100 mg total) by mouth as needed for erectile dysfunction.   TRELEGY ELLIPTA  100-62.5-25 MCG/ACT AEPB INHALE 1 PUFF BY MOUTH EVERY DAY   ULORIC  40 MG tablet Take 1 tablet (40 mg total) by mouth daily.     Allergies:   Patient has no known allergies.   Past Medical History:  Diagnosis Date   Adenocarcinoma of right lung, stage 3 (HCC) 08/23/2016   Anemia    Arthritis    hx right hip   Asthma    when I was a child   Atrial fibrillation (HCC)    Amiodarone  started 10/2011; Coumadin    Automatic implantable cardioverter-defibrillator  in situ 10/03/2012   a. St. Jude ICD implantation 10/03/12.   Chronic anticoagulation    Chronic fatigue 10/18/2016   Chronic systolic heart failure (HCC)    a. Echo 7/13: EF 25%;  b. echo 04/2012:  Mild LVH, EF 30-35%, Gr 1 DD, mild AI, mild MR, mild LAE   COPD (chronic obstructive pulmonary disease) (HCC)    Dyslipidemia    Gout    History of blood transfusion 10/15/2013   don't know where the blood's going; HgB down to 5   Hyperlipidemia    Hypertension    Hypothyroidism    NICM (nonischemic cardiomyopathy) (HCC)    LHC 4/14:  minimal CAD   Obesity    OSA on CPAP    Thoracic ascending aortic aneurysm 07/25/2023   CT 07/25/23: TAA 4.1 cm; 7 mm LUL nodule    Tobacco abuse     Past Surgical History:  Procedure Laterality Date   CARDIAC DEFIBRILLATOR PLACEMENT  2014   CARDIOVERSION  2011   CARDIOVERSION N/A 03/15/2020   Procedure: CARDIOVERSION;  Surgeon: [name] Vina GAILS, MD;  Location: [facility] ENDOSCOPY;  Service: Cardiovascular;  Laterality: N/A;   COLONOSCOPY WITH PROPOFOL  Left 10/17/2013   Procedure: COLONOSCOPY WITH PROPOFOL ;  Surgeon: Lamar JONETTA Aho, MD;  Location: [facility] ENDOSCOPY;  Service: Endoscopy;  Laterality: Left;   ESOPHAGOGASTRODUODENOSCOPY N/A 10/17/2013   Procedure: ESOPHAGOGASTRODUODENOSCOPY (EGD);  Surgeon: Lamar JONETTA Aho, MD;  Location: [facility] ENDOSCOPY;  Service: Endoscopy;  Laterality: N/A;   ESOPHAGOGASTRODUODENOSCOPY (EGD) WITH PROPOFOL  N/A 06/14/2018   Procedure: ESOPHAGOGASTRODUODENOSCOPY (EGD) WITH PROPOFOL ;  Surgeon: Legrand Victory LITTIE DOUGLAS, MD;  Location: [facility] ENDOSCOPY;  Service: Gastroenterology;  Laterality: N/A;   GIVENS CAPSULE STUDY N/A 10/29/2013   Procedure: GIVENS CAPSULE STUDY;  Surgeon: Lamar JONETTA Aho, MD;  Location: [facility] ENDOSCOPY;  Service: Endoscopy;  Laterality: N/A;   ICD GENERATOR CHANGEOUT N/A 02/13/2018   Procedure: ICD GENERATOR CHANGEOUT;  Surgeon: Inocencio Soyla Lunger, MD;  Location: [facility] INVASIVE CV LAB;  Service: Cardiovascular;  Laterality: N/A;    IMPLANTABLE CARDIOVERTER DEFIBRILLATOR IMPLANT Left 10/03/2012   Procedure: IMPLANTABLE CARDIOVERTER DEFIBRILLATOR IMPLANT;  Surgeon: Elspeth JAYSON Sage, MD;  Location: [facility] CATH LAB;  Service: Cardiovascular;  Laterality:  Left;   IR FLUORO GUIDE PORT INSERTION RIGHT  09/21/2016   IR US  GUIDE VASC ACCESS RIGHT  09/21/2016   JOINT REPLACEMENT     REVERSE SHOULDER ARTHROPLASTY Right 09/22/2020   Procedure: SHOULDER HEMI ARTHROPLASTY;  Surgeon: Cristy Bonner DASEN, MD;  Location: [facility] ORS;  Service: Orthopedics;  Laterality: Right;   SAVORY DILATION N/A 06/14/2018   Procedure: SAVORY DILATION;  Surgeon: Legrand Victory LITTIE DOUGLAS, MD;  Location: [facility] ENDOSCOPY;  Service: Gastroenterology;  Laterality: N/A;   TONSILLECTOMY  1950's   TOTAL HIP ARTHROPLASTY Right 11/25/1997   V TACH ABLATION N/A 09/15/2021   Procedure: V TACH ABLATION;  Surgeon: [name] Danelle ORN, MD;  Location: [facility] INVASIVE CV LAB;  Service: Cardiovascular;  Laterality: N/A;   VIDEO BRONCHOSCOPY WITH ENDOBRONCHIAL ULTRASOUND N/A 08/09/2016   Procedure: VIDEO BRONCHOSCOPY WITH ENDOBRONCHIAL ULTRASOUND;  Surgeon: Noreen Tonnie BRAVO, MD;  Location: [facility] OR;  Service: Thoracic;  Laterality: N/A;     Social History:  The patient  reports that he quit smoking about 7 years ago. His smoking use included cigarettes. He started smoking about 52 years ago. He has a 11.3 pack-year smoking history. He has never used smokeless tobacco. He reports current alcohol  use of about 1.0 standard drink of alcohol  per week. He reports that he does not use drugs.   Family History:  The patient's family history includes Heart Problems in his mother; Hypertension in his mother; Lung cancer in his brother; Other in his father.    ROS:  Please see the history of present illness. All other systems are reviewed and  Negative to the above problem except as noted.    PHYSICAL EXAM: VS:  BP 126/64 (BP Location: Left Arm, Patient Position: Sitting, Cuff Size: Large)   Pulse (!) 59   Ht 6' 1  (1.854 m)   Wt (!) 307 lb 8 oz (139.5 kg)   SpO2 99%   BMI 40.57 kg/m   GEN: Morbidly obese 74 yo  in no acute distress  Neck: neck is full Difficult to assess JVP Cardiac: RRR; no murmurs,  Respiratory:  clear to auscultation No rales GI: soft, nontender   No RUQ tenderness  Ext   Tr to 1+  LE edema  Soft   WEaring support socks    EKG:  EKG :  SB 59  First degree AV block  PR 228 msec  RBBB.  LVH    Echo  April 2025 1. Left ventricular ejection fraction, by estimation, is 35 to 40%. The  left ventricle has moderately decreased function. The left ventricle  demonstrates global hypokinesis. The left ventricular internal cavity size  was moderately to severely dilated.  There is mild eccentric left ventricular hypertrophy. Left ventricular  diastolic parameters are consistent with Grade I diastolic dysfunction  (impaired relaxation).   2. Right ventricular systolic function is normal. The right ventricular  size is normal. Tricuspid regurgitation signal is inadequate for assessing  PA pressure.   3. Left atrial size was moderately dilated.   4. Right atrial size was mildly dilated.   5. The mitral valve is normal in structure. Moderate mitral valve  regurgitation.   6. The aortic valve is tricuspid. Aortic valve regurgitation is moderate.   7. Aortic dilatation noted. There is severe dilatation of the aortic  root, measuring 50 mm. There is mild dilatation of the ascending aorta,  measuring 40 mm.   8. The inferior vena cava is normal in size with greater than 50%  respiratory  variability, suggesting right atrial pressure of 3 mmHg.    Lipid Panel    Component Value Date/Time   CHOL 117 01/28/2020 0927   TRIG 71 01/28/2020 0927   HDL 31 (L) 01/28/2020 0927   CHOLHDL 3.8 01/28/2020 0927   CHOLHDL 3 12/23/2012 1205   VLDL 14.6 12/23/2012 1205   LDLCALC 71 01/28/2020 0927      Wt Readings from Last 3 Encounters:  04/18/24 (!) 307 lb 8 oz (139.5 kg)  11/19/23 (!) 301 lb  (136.5 kg)  11/15/23 300 lb (136.1 kg)      ASSESSMENT AND PLAN:  1  HFrEF   Dx in 2013   Nonischemic   Most recent echo LVEF 35 to 40%    Admitted this summer with exacerbation   Today volume status appears OK   Mild chronic LE edema     Keep on same meds (Zebeta , Entresto , Jardiance )  Renal function limits aldactone .   Has ICD    2  Hx VT   Pt with ICD  ON amiodarone      Inapprop ATP this summer for afib   3  PAF  Continue amiodarone    FOllow labs     Interrogate device per EP  4  HTN  BP is adequately controlled   5  CAD  Nonobstructive by cath in 2014   Myoview  this past summer showed no ischemia    6  VT  Pt with PMVT on study Keep on amiodarone    Has ICD    FOllow with EP    7  AOrtic aneurysm.   Follow with CT, echo   Has been seen by [facility]    8  Aortic insufficiency  Moderate on recent echo   Follow periodically   9  Mitral regurgitation  Moderate on recent echo   Follow   10  HL  LDL 64, HDL 34, trig 118 (Sept 2025)    11  CKD  Last Cr in Sept is 2.76   Pt followed by renal   Will check BMEt       12  Hx lung CA   Treated   Will be getting periodic CT  scans   No concerning nodules on recent scan  13  OSA  Keep on CPAP   Current medicines are reviewed at length with the patient today.  The patient does not have concerns regarding medicines.  Signed, Vina Gull, MD  "

## 2024-04-17 NOTE — Progress Notes (Signed)
 Remote ICD Transmission

## 2024-04-18 ENCOUNTER — Ambulatory Visit: Admitting: Internal Medicine

## 2024-04-18 ENCOUNTER — Ambulatory Visit: Payer: Self-pay | Admitting: Cardiovascular Disease

## 2024-04-18 ENCOUNTER — Encounter: Payer: Self-pay | Admitting: Internal Medicine

## 2024-04-18 VITALS — BP 126/64 | HR 59 | Ht 73.0 in | Wt 307.5 lb

## 2024-04-18 DIAGNOSIS — Z79899 Other long term (current) drug therapy: Secondary | ICD-10-CM | POA: Insufficient documentation

## 2024-04-18 DIAGNOSIS — I5022 Chronic systolic (congestive) heart failure: Secondary | ICD-10-CM | POA: Insufficient documentation

## 2024-04-18 DIAGNOSIS — I4891 Unspecified atrial fibrillation: Secondary | ICD-10-CM | POA: Diagnosis present

## 2024-04-18 NOTE — Patient Instructions (Signed)
 Medication Instructions:  The current medical regimen is effective;  continue present plan and medications.  *If you need a refill on your cardiac medications before your next appointment, please call your pharmacy*  Lab Work: Please have blood work today (CMP)  If you have labs (blood work) drawn today and your tests are completely normal, you will receive your results only by: MyChart Message (if you have MyChart) OR A paper copy in the mail If you have any lab test that is abnormal or we need to change your treatment, we will call you to review the results.  Follow-Up: At [facility], you and your health needs are our priority.  As part of our continuing mission to provide you with exceptional heart care, our providers are all part of one team.  This team includes your primary Cardiologist (physician) and Advanced Practice Providers or APPs (Physician Assistants and Nurse Practitioners) who all work together to provide you with the care you need, when you need it.  Your next appointment:   Follow up in September 2026 with Dr [name].  Provider:   Vina [name], MD    We recommend signing up for the patient portal called MyChart.  Sign up information is provided on this After Visit Summary.  MyChart is used to connect with patients for Virtual Visits (Telemedicine).  Patients are able to view lab/test results, encounter notes, upcoming appointments, etc.  Non-urgent messages can be sent to your provider as well.   To learn more about what you can do with MyChart, go to [email or web address].

## 2024-04-19 LAB — COMPREHENSIVE METABOLIC PANEL WITH GFR
ALT: 14 IU/L (ref 0–44)
AST: 19 IU/L (ref 0–40)
Albumin: 4.4 g/dL (ref 3.8–4.8)
Alkaline Phosphatase: 116 IU/L (ref 47–123)
BUN/Creatinine Ratio: 15 (ref 10–24)
BUN: 41 mg/dL — ABNORMAL HIGH (ref 8–27)
Bilirubin Total: 0.3 mg/dL (ref 0.0–1.2)
CO2: 24 mmol/L (ref 20–29)
Calcium: 9.3 mg/dL (ref 8.6–10.2)
Chloride: 102 mmol/L (ref 96–106)
Creatinine, Ser: 2.8 mg/dL — ABNORMAL HIGH (ref 0.76–1.27)
Globulin, Total: 2.4 g/dL (ref 1.5–4.5)
Glucose: 99 mg/dL (ref 70–99)
Potassium: 3.5 mmol/L (ref 3.5–5.2)
Sodium: 143 mmol/L (ref 134–144)
Total Protein: 6.8 g/dL (ref 6.0–8.5)
eGFR: 23 mL/min/1.73 — ABNORMAL LOW

## 2024-04-25 ENCOUNTER — Encounter: Payer: Self-pay | Admitting: Adult Health

## 2024-04-25 ENCOUNTER — Ambulatory Visit: Admitting: Adult Health

## 2024-04-25 VITALS — BP 123/69 | HR 54 | Temp 97.4°F | Ht 73.0 in | Wt 305.0 lb

## 2024-04-25 DIAGNOSIS — Z87891 Personal history of nicotine dependence: Secondary | ICD-10-CM

## 2024-04-25 DIAGNOSIS — Z6841 Body Mass Index (BMI) 40.0 and over, adult: Secondary | ICD-10-CM

## 2024-04-25 DIAGNOSIS — I509 Heart failure, unspecified: Secondary | ICD-10-CM

## 2024-04-25 DIAGNOSIS — J449 Chronic obstructive pulmonary disease, unspecified: Secondary | ICD-10-CM | POA: Diagnosis not present

## 2024-04-25 DIAGNOSIS — J439 Emphysema, unspecified: Secondary | ICD-10-CM

## 2024-04-25 DIAGNOSIS — I5022 Chronic systolic (congestive) heart failure: Secondary | ICD-10-CM

## 2024-04-25 DIAGNOSIS — G4733 Obstructive sleep apnea (adult) (pediatric): Secondary | ICD-10-CM | POA: Diagnosis not present

## 2024-04-25 DIAGNOSIS — C3490 Malignant neoplasm of unspecified part of unspecified bronchus or lung: Secondary | ICD-10-CM

## 2024-04-25 DIAGNOSIS — C3411 Malignant neoplasm of upper lobe, right bronchus or lung: Secondary | ICD-10-CM

## 2024-04-25 NOTE — Progress Notes (Signed)
 "  @Patient  ID: Jason Russell, male    DOB: May 13, 1950, 74 y.o.   MRN: [id number]  Chief Complaint  Patient presents with   Medical Management of Chronic Issues    15m f/u    Referring provider: [name], MD  HPI: 74 year old male followed for severe COPD, severe obstructive sleep apnea, history of non-small cell lung cancer (stage IIIa non-small lung cancer, adenocarcinoma in the right upper lobe in addition to mediastinal lymphadenopathy status post chemo, immunotherapy and XRT) Medical history significant for nonischemic heart disease, cardiomyopathy, congestive heart failure, A-fib status post ICD implant on amiodarone  and Xarelto , chronic kidney disease    TEST/EVENTS : Reviewed 04/25/2024  07/31/16: FVC 1.84 L (41%) FEV1 1.14 L (42%) FEV1/FVC 0.77 FEF 25-75 1.17 L (38%) positive bronchodilator response TLC 5.76 L (75%) RV 140% ERV 48% DLCO uncorrected 52%     PFTs that show improved lung capacity with FEV1 at 57%, ratio 77, FVC 55%, DLCO 59%    IMAGING PET CT 07/31/16 Markedly hypermetabolic posterior right upper lobe pulmonary lesion consistent with malignancy and hypermetabolic mediastinal lymph node metastases.   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 This obscures the regional right upper lobe apical lung nodule. No obvious nodular growth or recurrent thoracic adenopathy.   PATHOLOGY FNA RUL 09/06/16:  Adenocarcinoma  EBUS 4R & 7 08/09/16:  Adenocarcinoma    CT chest October 18, 2020 shows stable posttreatment changes of radiation fibrosis in the right hemothorax with no evidence to suggest local recurrence of disease or definite metastatic disease in the thorax.  Very mild emphysema.   CT chest October 25, 2021 showed no new or progressive findings in that chest with evidence of posttreatment change in the right hilum and right paramediastinal lung.  Surveillance CT chest May 16, 2023 showed stable radiation changes. No findings for  metastatic disease or recurrent tumor.   CT chest January 02, 2024 unchanged posttreatment appearance with scarring and fibrotic volume loss in the right upper lobe, unchanged right upper lobe nodule measuring 0.3 cm, emphysema and smoking-related bronchiolitis  Discussed the use of AI scribe software for clinical note transcription with the patient, who gave verbal consent to proceed.  History of Present Illness Jason Russell is a 74 year old male with severe sleep apnea who presents for a six-month checkup.  He was diagnosed with sleep apnea in 2011 and uses a CPAP machine with a full face mask for management. The machine is functioning well, and he receives supplies from [employer]  CPAP download shows excellent compliance with daily average usage at 8.5 hours.  Patient is on auto CPAP 5 to 15 cm H2O.  AHI 2.1/hour daily average pressure at 14.5 cm H2O.  We discussed CPAP care and detail  Patient has a history of stage IIIa non-small cell lung cancer, adenocarcinoma of the right upper lobe in addition to mediastinal lymphadenopathy status post chemotherapy, immunotherapy and radiation.  He is followed by oncology.  Surveillance CT imaging January 02, 2024 showed unchanged posttreatment appearance of the chest with scarring and fibrotic loss in the right upper lobe and an unchanged right upper lobe nodule measuring 0.3 cm.  Patient has upcoming appointment and surveillance scan in a few months.    Patient has underlying COPD.  Remains on Trelegy daily.  Denies any flare of cough or wheezing  No need for medication refills at this time. No problems with breathing, shortness of breath, hemoptysis, or vomiting. He feels pretty good overall.  Allergies[1]  Immunization History  Administered Date(s) Administered   Fluad Quad(high Dose 65+) 12/06/2021, 01/17/2023   INFLUENZA, HIGH DOSE SEASONAL PF 11/14/2016, 11/24/2018   Influenza Split 10/27/2016   Influenza,inj,Quad PF,6+ Mos  12/08/2015   Influenza,inj,quad, With Preservative 12/03/2019   Influenza-Unspecified 11/02/2014, 12/08/2015, 10/27/2016   PFIZER Comirnaty(Gray Top)Covid-19 Tri-Sucrose Vaccine 05/10/2019, 01/03/2020, 01/19/2023   PFIZER(Purple Top)SARS-COV-2 Vaccination 05/10/2019, 06/02/2019   PNEUMOCOCCAL CONJUGATE-20 12/25/2021   Pneumococcal Conjugate-13 11/14/2016   Pneumococcal Polysaccharide-23 10/16/2013   Pneumococcal-Unspecified 10/27/2016    Past Medical History:  Diagnosis Date   Adenocarcinoma of right lung, stage 3 (HCC) 08/23/2016   Anemia    Arthritis    hx right hip   Asthma    when I was a child   Atrial fibrillation (HCC)    Amiodarone  started 10/2011; Coumadin    Automatic implantable cardioverter-defibrillator in situ 10/03/2012   a. [employer] ICD implantation 10/03/12.   Chronic anticoagulation    Chronic fatigue 10/18/2016   Chronic systolic heart failure (HCC)    a. Echo 7/13: EF 25%;  b. echo 04/2012:  Mild LVH, EF 30-35%, Gr 1 DD, mild AI, mild MR, mild LAE   COPD (chronic obstructive pulmonary disease) (HCC)    Dyslipidemia    Gout    History of blood transfusion 10/15/2013   don't know where the blood's going; HgB down to 5   Hyperlipidemia    Hypertension    Hypothyroidism    NICM (nonischemic cardiomyopathy) (HCC)    LHC 4/14:  minimal CAD   Obesity    OSA on CPAP    Thoracic ascending aortic aneurysm 07/25/2023   CT 07/25/23: TAA 4.1 cm; 7 mm LUL nodule    Tobacco abuse     Tobacco History: Tobacco Use History[2] Counseling given: Not Answered   Outpatient Medications Prior to Visit  Medication Sig Dispense Refill   albuterol  (VENTOLIN  HFA) 108 (90 Base) MCG/ACT inhaler INHALE 2 PUFFS INTO THE LUNGS EVERY 4 HOURS AS NEEDED FOR WHEEZING OR SHORTNESS OF BREATH. 18 each 5   amiodarone  (PACERONE ) 200 MG tablet Take 1 tablet (200 mg total) by mouth daily. 90 tablet 2   atorvastatin  (LIPITOR) 40 MG tablet TAKE 1 TABLET BY MOUTH EVERY DAY 90 tablet 3    Bempedoic Acid-Ezetimibe (NEXLIZET ) 180-10 MG TABS Take 1 tablet by mouth daily in the afternoon. 90 tablet 2   bisoprolol  (ZEBETA ) 5 MG tablet TAKE 1 TABLET (5 MG TOTAL) BY MOUTH DAILY. 90 tablet 3   clotrimazole-betamethasone (LOTRISONE) cream Apply topically.     [employer] D3 2000 units CAPS Take 2,000 Units by mouth daily.   11   dapagliflozin  propanediol (FARXIGA ) 10 MG TABS tablet Take 10 mg by mouth every morning.     ferrous gluconate  (FERGON) 324 MG tablet Take 1 tablet (324 mg total) by mouth 3 (three) times daily with meals. 270 tablet 3   furosemide  (LASIX ) 20 MG tablet Take 3 tablets (60 mg in total) by mouth daily as needed for weight gain of 3-4 pounds in 24 hours. (Patient taking differently: Take 1.5 Tablets (60 mg Total) by mouth 2 (two) times a day) 90 tablet 0   JARDIANCE  10 MG TABS tablet Take 10 mg by mouth daily.     levothyroxine  (SYNTHROID ) 50 MCG tablet TAKE 1 TABLET BY MOUTH EVERY DAY BEFORE BREAKFAST 90 tablet 2   magnesium  oxide (MAG-OX) 400 (241.3 Mg) MG tablet Take 1 tablet (400 mg total) by mouth daily. 30 tablet 0   Polyvinyl Alcohol -Povidone (  REFRESH OP) Place 1 drop into both eyes every morning.     Potassium Chloride  ER 20 MEQ TBCR TAKE 1 TABLET BY MOUTH EVERY DAY 90 tablet 3   Rivaroxaban  (XARELTO ) 15 MG TABS tablet Take 1 tablet (15 mg total) by mouth daily with supper. 90 tablet 1   sacubitril -valsartan  (ENTRESTO ) 97-103 MG TAKE 1 TABLET BY MOUTH TWICE A DAY 180 tablet 2   sildenafil  (VIAGRA ) 100 MG tablet Take 1 tablet (100 mg total) by mouth as needed for erectile dysfunction. 30 tablet 2   TRELEGY ELLIPTA  100-62.5-25 MCG/ACT AEPB INHALE 1 PUFF BY MOUTH EVERY DAY 180 each 3   ULORIC  40 MG tablet Take 1 tablet (40 mg total) by mouth daily. 30 tablet 0   No facility-administered medications prior to visit.     Review of Systems:   Constitutional:   No  weight loss, night sweats,  Fevers, chills,+ fatigue, or  lassitude.  HEENT:   No headaches,  Difficulty  swallowing,  Tooth/dental problems, or  Sore throat,                No sneezing, itching, ear ache, nasal congestion, post nasal drip,   CV:  No chest pain,  Orthopnea, PND, +swelling in lower extremities, anasarca, dizziness, palpitations, syncope.   GI  No heartburn, indigestion, abdominal pain, nausea, vomiting, diarrhea, change in bowel habits, loss of appetite, bloody stools.   Resp: .  No chest wall deformity  Skin: no rash or lesions.  GU: no dysuria, change in color of urine, no urgency or frequency.  No flank pain, no hematuria   MS:  No joint pain or swelling.  No decreased range of motion.  No back pain.    Physical Exam  BP 123/69   Pulse (!) 54   Temp (!) 97.4 F (36.3 C)   Ht 6' 1 (1.854 m) Comment: Per pt  Wt (!) 305 lb (138.3 kg)   SpO2 98% Comment: RA  BMI 40.24 kg/m   GEN: A/Ox3; pleasant , NAD, well nourished    HEENT:  [place]/AT,   NOSE-clear, THROAT-clear, no lesions, no postnasal drip or exudate noted.   NECK:  Supple w/ fair ROM; no JVD; normal carotid impulses w/o bruits; no thyromegaly or nodules palpated; no lymphadenopathy.    RESP  Clear  P & A; w/o, wheezes/ rales/ or rhonchi. no accessory muscle use, no dullness to percussion  CARD:  RRR, no m/r/g, 1+ peripheral edema, pulses intact, no cyanosis or clubbing.  GI:   Soft & nt; nml bowel sounds; no organomegaly or masses detected.   Musco: Warm bil, no deformities or joint swelling noted.   Neuro: alert, no focal deficits noted.    Skin: Warm, no lesions or rashes    Lab Results:Reviewed 04/25/2024   CBC   BMET     Imaging:  Administration History     None          Latest Ref Rng & Units 05/07/2023    8:18 AM 07/31/2016    9:47 AM  PFT Results  FVC-Pre L 2.73  1.84   FVC-Predicted Pre % 55  41   FVC-Post L  2.32   FVC-Predicted Post %  52   Pre FEV1/FVC % % 77  77   Post FEV1/FCV % %  73   FEV1-Pre L 2.09  1.42   FEV1-Predicted Pre % 57  42   FEV1-Post L  1.69    DLCO uncorrected ml/min/mmHg 16.77  19.27  DLCO UNC% % 59  52   DLCO corrected ml/min/mmHg 16.77    DLCO COR %Predicted % 59    DLVA Predicted % 89  109   TLC L  5.76   TLC % Predicted %  75   RV % Predicted %  140     No results found for: NITRICOXIDE      No data to display              Assessment & Plan:   Assessment and Plan Assessment & Plan Obstructive sleep apnea   Well-controlled with CPAP therapy. He reports no issues with the CPAP machine and uses a full face mask. The machine effectively controls his sleep apnea with minimal episodes. He complies with using distilled water  and maintains cleanliness. Continue CPAP therapy with a full face mask. Use distilled water  in the CPAP machine. Maintain regular follow-up to monitor usage and effectiveness.   COPD under good control on current maintenance regimen.  Continue on Trelegy daily.  Inhaler care discussed  Morbid obesity with BMI of 40.  Encouraged on healthy weight management  History of stage IIIa non-small cell lung cancer status postchemotherapy, immunotherapy, XRT.  Surveillance CT imaging has showed stable changes.  Continue follow-up surveillance imaging with oncology as planned  History of congestive heart failure.  Appears compensated on exam keep follow-up with cardiology  Plan  Patient Instructions  Continue on TRELEGY daily , rinse after use Activity as tolerated.  Albuterol  inhaler as needed  Follow up with Oncology as planned  Continue with surveillance CT scans.  Continue on CPAP at bedtime Do not drive if sleepy Work on [employer] .    Follow up with Dr. Shelah or Matei Magnone NP In 6 months and As needed         Madelin Stank, NP 04/25/2024     [1] No Known Allergies [2]  Social History Tobacco Use  Smoking Status Former   Current packs/day: 0.00   Average packs/day: 0.3 packs/day for 45.0 years (11.3 ttl pk-yrs)   Types: Cigarettes   Start date: 07/12/1971   Quit date:  07/11/2016   Years since quitting: 7.7  Smokeless Tobacco Never   "

## 2024-04-25 NOTE — Patient Instructions (Addendum)
 Continue on TRELEGY daily , rinse after use Activity as tolerated.  Albuterol  inhaler as needed  Follow up with Oncology as planned  Continue with surveillance CT scans.  Continue on CPAP at bedtime Do not drive if sleepy Work on [employer] .    Follow up with Dr. Shelah or Tommy Goostree NP In 6 months and As needed

## 2024-05-01 NOTE — Progress Notes (Signed)
 31 day ICM Remote transmission canceled due to [facility] clinic is on hold until further notice.  91 day remote monitoring will continue per protocol.

## 2024-05-05 ENCOUNTER — Ambulatory Visit

## 2024-05-05 ENCOUNTER — Other Ambulatory Visit: Payer: Self-pay | Admitting: Internal Medicine

## 2024-11-13 ENCOUNTER — Other Ambulatory Visit

## 2024-11-20 ENCOUNTER — Ambulatory Visit: Admitting: Internal Medicine

## 2025-01-01 ENCOUNTER — Other Ambulatory Visit (HOSPITAL_COMMUNITY)
